# Patient Record
Sex: Male | Born: 1970 | Race: White | Hispanic: No | Marital: Married | Smoking: Current every day smoker
Health system: Southern US, Community
[De-identification: ages and names within clinical notes are randomized; demographics above are authoritative.]

## PROBLEM LIST (undated history)

## (undated) DIAGNOSIS — Z8679 Personal history of other diseases of the circulatory system: Secondary | ICD-10-CM

## (undated) DIAGNOSIS — F1910 Other psychoactive substance abuse, uncomplicated: Secondary | ICD-10-CM

## (undated) DIAGNOSIS — I10 Essential (primary) hypertension: Secondary | ICD-10-CM

## (undated) HISTORY — PX: OTHER SURGICAL HISTORY: SHX169

## (undated) HISTORY — DX: Essential (primary) hypertension: I10

---

## 1997-04-27 HISTORY — PX: EXPLORATORY LAPAROTOMY: SUR591

## 1997-11-21 ENCOUNTER — Emergency Department (HOSPITAL_COMMUNITY): Admission: EM | Admit: 1997-11-21 | Discharge: 1997-11-21 | Payer: Self-pay | Admitting: Emergency Medicine

## 1997-12-01 ENCOUNTER — Inpatient Hospital Stay (HOSPITAL_COMMUNITY): Admission: EM | Admit: 1997-12-01 | Discharge: 1997-12-04 | Payer: Self-pay | Admitting: Emergency Medicine

## 1998-02-06 ENCOUNTER — Emergency Department (HOSPITAL_COMMUNITY): Admission: EM | Admit: 1998-02-06 | Discharge: 1998-02-06 | Payer: Self-pay | Admitting: Emergency Medicine

## 1998-03-08 ENCOUNTER — Encounter: Payer: Self-pay | Admitting: Emergency Medicine

## 1998-03-08 ENCOUNTER — Emergency Department (HOSPITAL_COMMUNITY): Admission: EM | Admit: 1998-03-08 | Discharge: 1998-03-08 | Payer: Self-pay | Admitting: Emergency Medicine

## 1998-12-19 ENCOUNTER — Inpatient Hospital Stay (HOSPITAL_COMMUNITY): Admission: EM | Admit: 1998-12-19 | Discharge: 1998-12-25 | Payer: Self-pay

## 1998-12-23 ENCOUNTER — Encounter: Payer: Self-pay | Admitting: Psychology

## 1999-01-03 ENCOUNTER — Ambulatory Visit (HOSPITAL_COMMUNITY): Admission: RE | Admit: 1999-01-03 | Discharge: 1999-01-03 | Payer: Self-pay | Admitting: General Surgery

## 1999-01-03 ENCOUNTER — Encounter: Payer: Self-pay | Admitting: General Surgery

## 1999-06-04 ENCOUNTER — Emergency Department (HOSPITAL_COMMUNITY): Admission: EM | Admit: 1999-06-04 | Discharge: 1999-06-04 | Payer: Self-pay | Admitting: Emergency Medicine

## 1999-06-05 ENCOUNTER — Encounter: Payer: Self-pay | Admitting: Emergency Medicine

## 1999-06-08 ENCOUNTER — Emergency Department (HOSPITAL_COMMUNITY): Admission: EM | Admit: 1999-06-08 | Discharge: 1999-06-08 | Payer: Self-pay | Admitting: Emergency Medicine

## 1999-06-20 ENCOUNTER — Emergency Department (HOSPITAL_COMMUNITY): Admission: EM | Admit: 1999-06-20 | Discharge: 1999-06-20 | Payer: Self-pay | Admitting: *Deleted

## 1999-06-21 ENCOUNTER — Encounter: Payer: Self-pay | Admitting: *Deleted

## 1999-06-27 ENCOUNTER — Encounter: Payer: Self-pay | Admitting: Urology

## 1999-06-27 ENCOUNTER — Encounter: Admission: RE | Admit: 1999-06-27 | Discharge: 1999-06-27 | Payer: Self-pay | Admitting: Urology

## 1999-07-15 ENCOUNTER — Encounter: Payer: Self-pay | Admitting: Urology

## 1999-07-15 ENCOUNTER — Encounter: Admission: RE | Admit: 1999-07-15 | Discharge: 1999-07-15 | Payer: Self-pay | Admitting: Urology

## 1999-08-19 ENCOUNTER — Encounter: Admission: RE | Admit: 1999-08-19 | Discharge: 1999-08-19 | Payer: Self-pay | Admitting: *Deleted

## 2005-08-26 ENCOUNTER — Encounter: Payer: Self-pay | Admitting: Emergency Medicine

## 2006-06-14 ENCOUNTER — Emergency Department (HOSPITAL_COMMUNITY): Admission: EM | Admit: 2006-06-14 | Discharge: 2006-06-14 | Payer: Self-pay | Admitting: Emergency Medicine

## 2006-06-14 IMAGING — CT CT ABDOMEN W/O CM
2 of 4 series · 17 of 46 positions shown, 19 images · IV contrast (agent unspecified)
Comparison: none

CLINICAL DATA: Right flank pain for several days.  
 ABDOMEN CT WITHOUT CONTRAST:
TECHNIQUE: Multidetector CT imaging of the abdomen was performed following the standard protocol without IV contrast.
TECHNIQUE: Multidetector CT imaging of the pelvis was performed following the standard protocol without IV contrast.

[Series 3: recon 2: renal stone · axial · 0.70mm/px · z∈[-494,-131]mm · 14 of 629 slices shown, 16 images]
[im 24/629  soft-tissue]
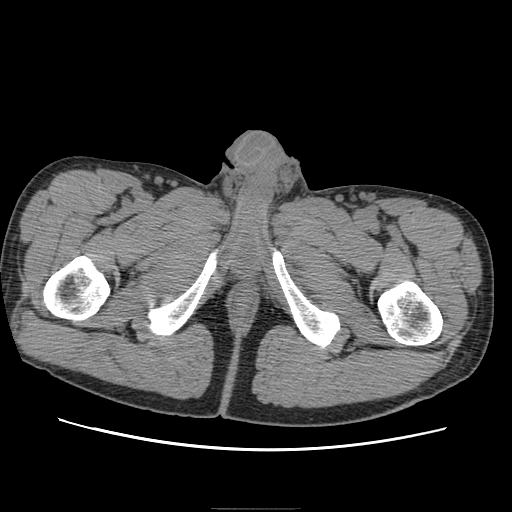
[im 24/629  bone]
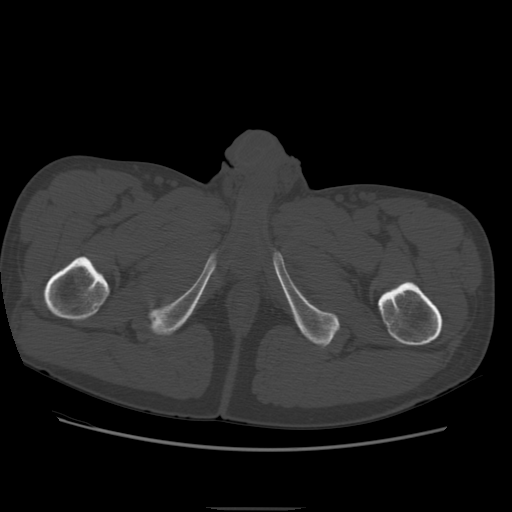
[im 70/629  soft-tissue]
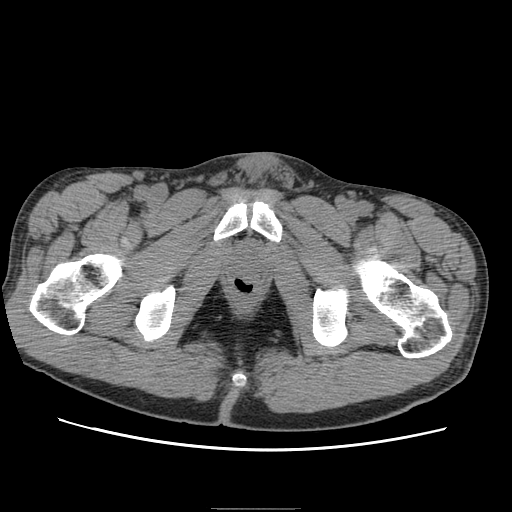
[im 117/629  soft-tissue]
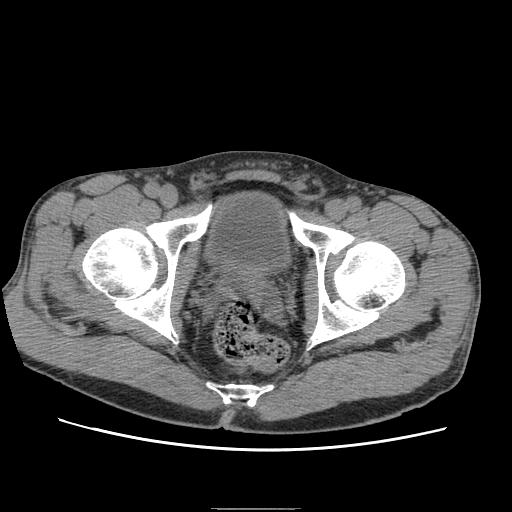
[im 163/629  soft-tissue]
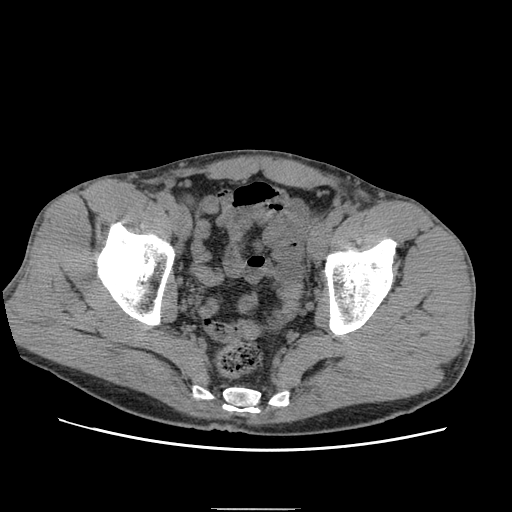
[im 210/629  soft-tissue]
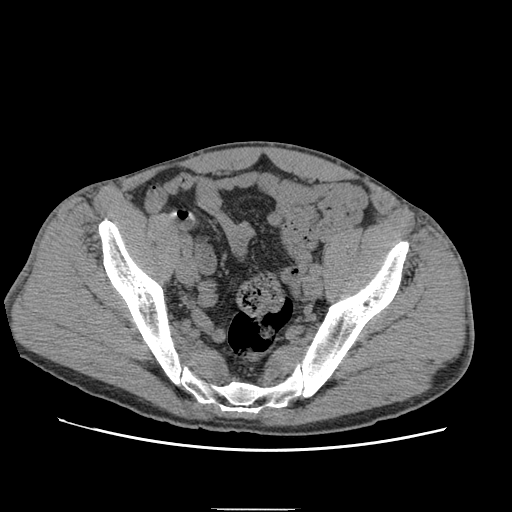
[im 256/629  soft-tissue]
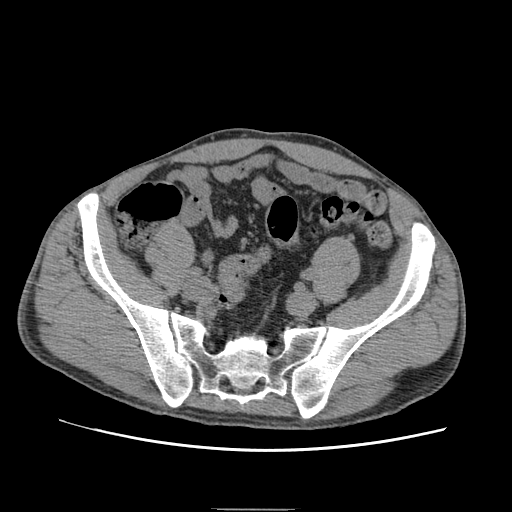
[im 303/629  soft-tissue]
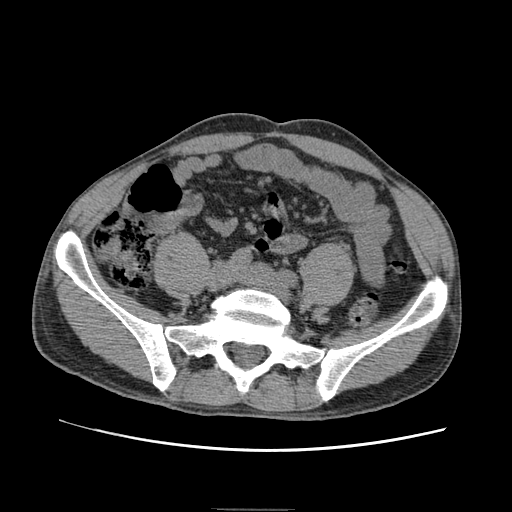
[im 326/629  soft-tissue]
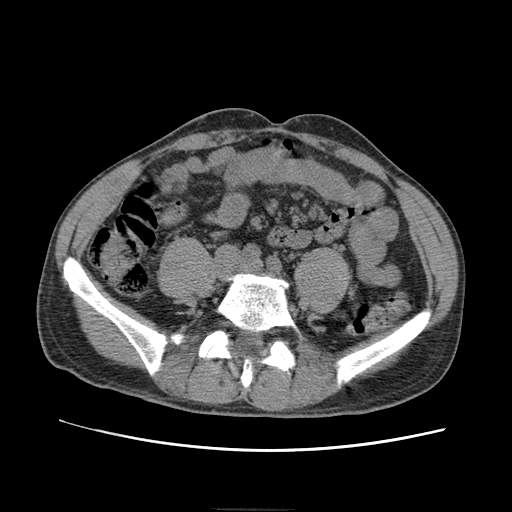
[im 373/629  soft-tissue]
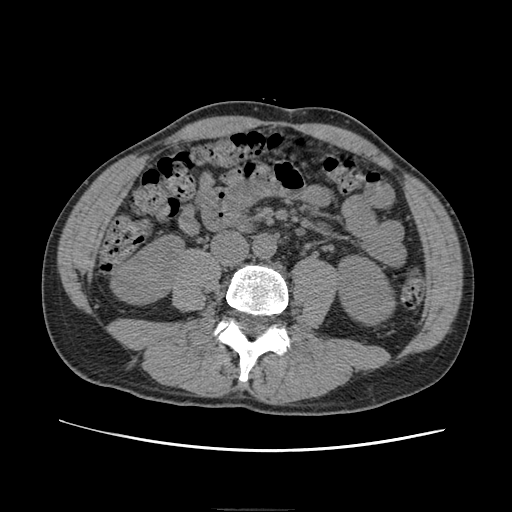
[im 373/629  bone]
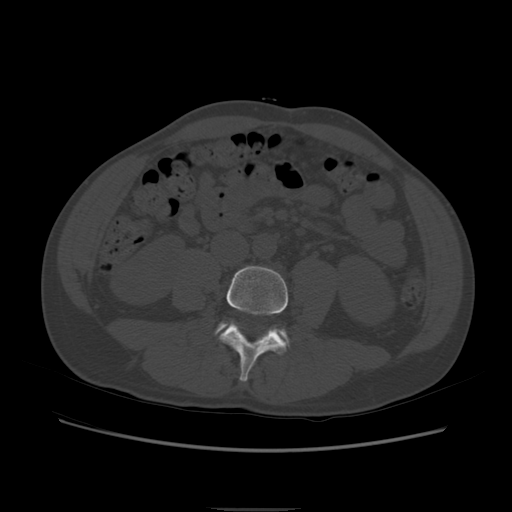
[im 419/629  soft-tissue]
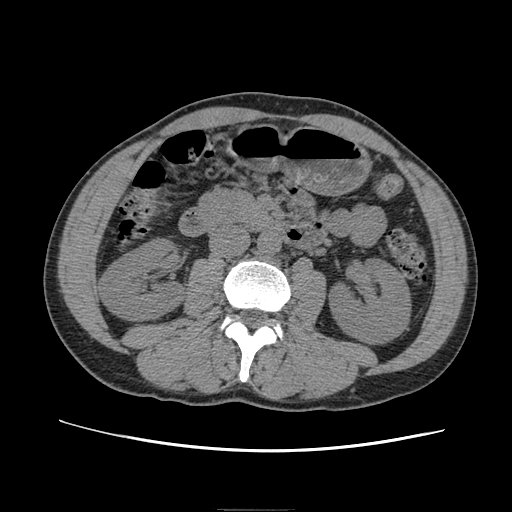
[im 466/629  soft-tissue]
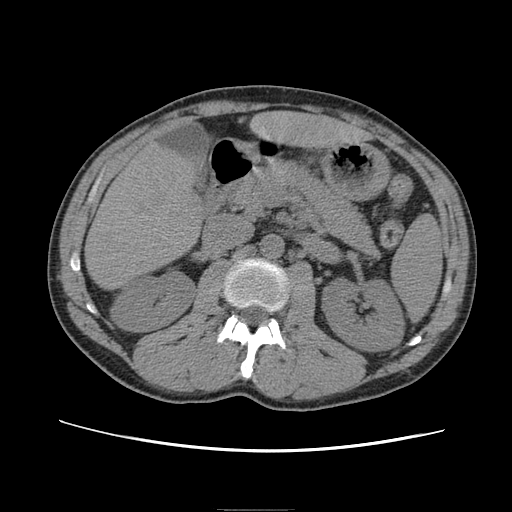
[im 512/629  soft-tissue]
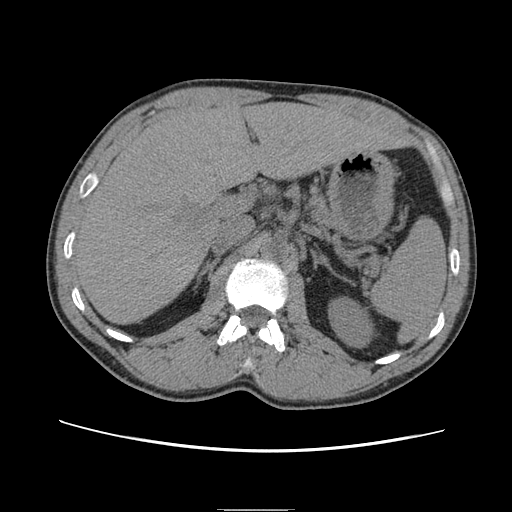
[im 559/629  soft-tissue]
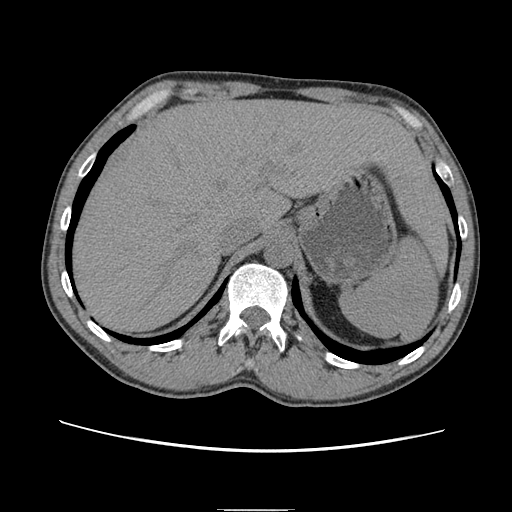
[im 605/629  soft-tissue]
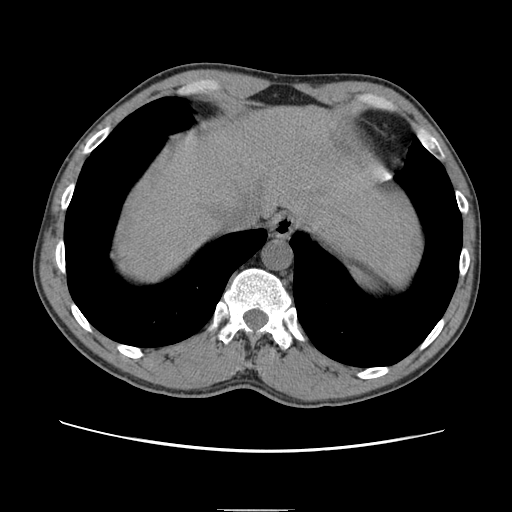

[Series 301: reformatted · coronal · 0.83mm/px · 3 of 116 slices shown]
[im 39/116  soft-tissue]
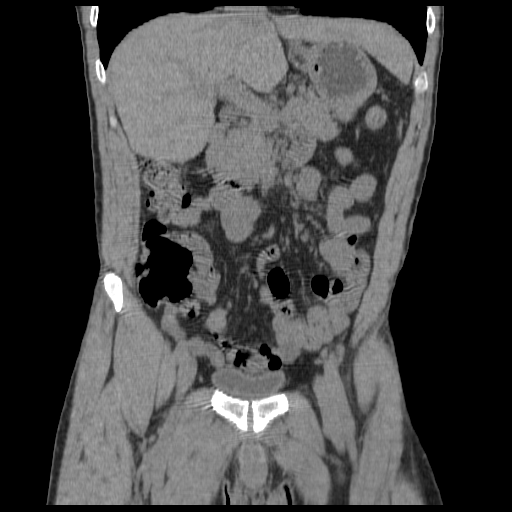
[im 52/116  soft-tissue]
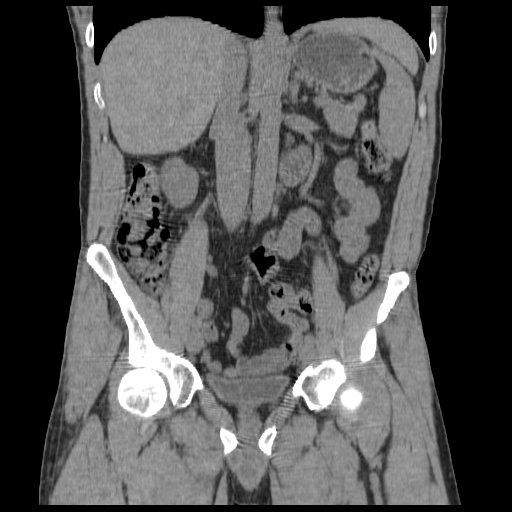
[im 64/116  soft-tissue]
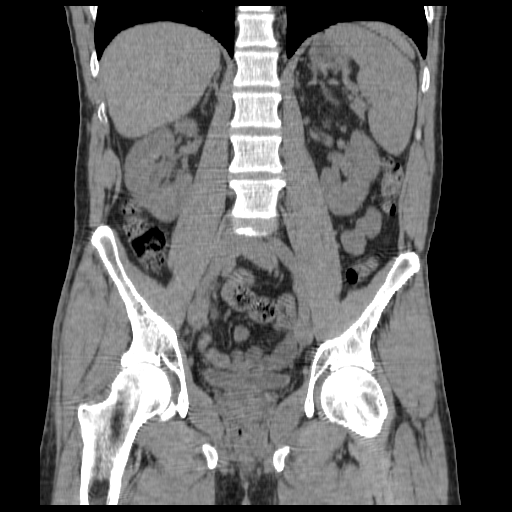

[17 of 46 positions shown; findings below may reference images not displayed]

FINDINGS: On the initial scan through the lung bases, there is a small nodule in the anterior right lower lobe of questionable significance.  No effusion is seen.  No renal calculi are seen and there is no evidence of hydronephrosis.  The proximal ureters are normal in caliber.  The remainder of the study shows the liver to appear normal in the unenhanced state.  No calcified gallstones are seen.  The pancreas, adrenal glands, and spleen appear normal.  The abdominal aorta is normal in caliber.
IMPRESSION: 1.  No renal calculi.  No hydronephrosis.  
 2.  Tiny nodule in right lower lobe anteriorly on initial image of questionable significance.  
 PELVIS CT WITHOUT CONTRAST:
FINDINGS: Despite the right ureter not being dilated, there does appear to be a nonobstructing 4.5 mm distal right ureteral calculus very near the expected right UV junction.  The left ureter is normal in caliber.  The urinary bladder is unremarkable.  The prostate is normal in size.
IMPRESSION: Nonobstructing 4.5 mm distal right ureteral calculus very near the expected right UV junction.

## 2006-10-24 ENCOUNTER — Emergency Department (HOSPITAL_COMMUNITY): Admission: EM | Admit: 2006-10-24 | Discharge: 2006-10-25 | Payer: Self-pay | Admitting: Emergency Medicine

## 2008-02-20 ENCOUNTER — Emergency Department (HOSPITAL_COMMUNITY): Admission: EM | Admit: 2008-02-20 | Discharge: 2008-02-20 | Payer: Self-pay | Admitting: Emergency Medicine

## 2008-11-01 ENCOUNTER — Emergency Department: Payer: Self-pay | Admitting: Emergency Medicine

## 2008-11-01 IMAGING — CR DG CHEST 1V
1 series · 1 of 1 positions shown · non-contrast
Comparison: none

REASON FOR EXAM: pain/ injury   Flex
COMMENTS:   LMP: (Male)

PROCEDURE:     DXR - DXR CHEST 1 VIEWAP OR PA  - [DATE]  [DATE]
RESULT:     PA view of the chest shows the lung fields to be clear. No
pneumonia, pneumothorax or pleural effusion is seen. Heart size is normal.
The chest is hyperexpanded, suspicious for a history of reactive airway
disease.

[view not recorded]
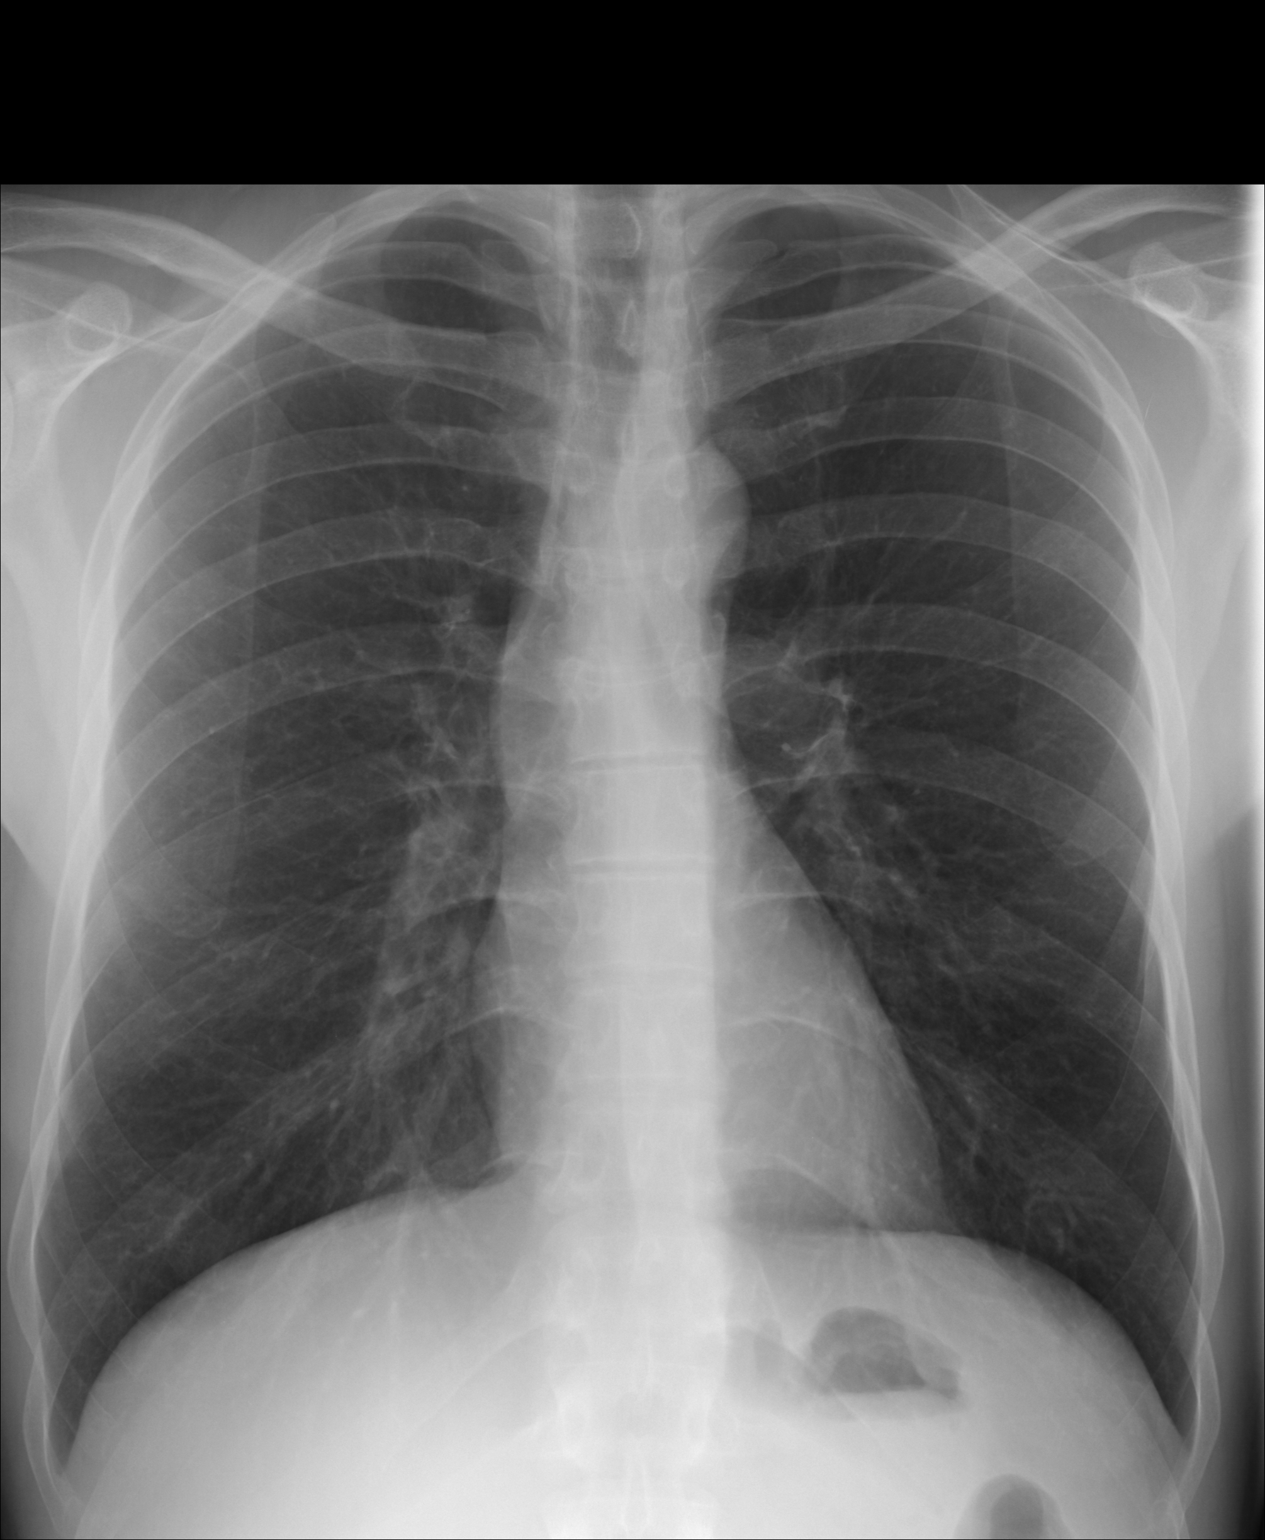

[1 of 1 positions shown; findings below may reference images not displayed]

IMPRESSION: 1. The lung fields are clear.
2. Heart size is normal.
3. No acute bony abnormalities are seen.

## 2008-11-01 IMAGING — CR DG RIBS 2V*L*
1 series · 4 of 4 positions shown · non-contrast
Comparison: none

REASON FOR EXAM: pain / injury
COMMENTS:   LMP: (Male)

PROCEDURE:     DXR - DXR RIBS LEFT UNILATERAL  - [DATE]  [DATE]
RESULT:     No fracture or other acute bony abnormality is identified. No
pneumothorax is seen.

[Series 1: view not recorded · 0.17mm/px · 4 of 4 slices shown]
[im 1/4]
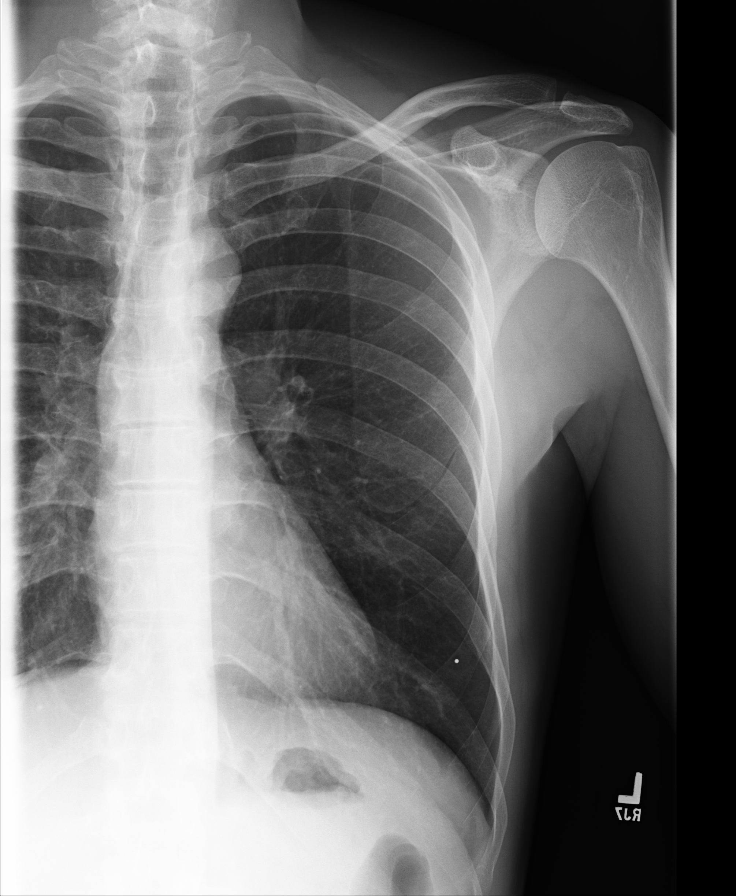
[im 2/4]
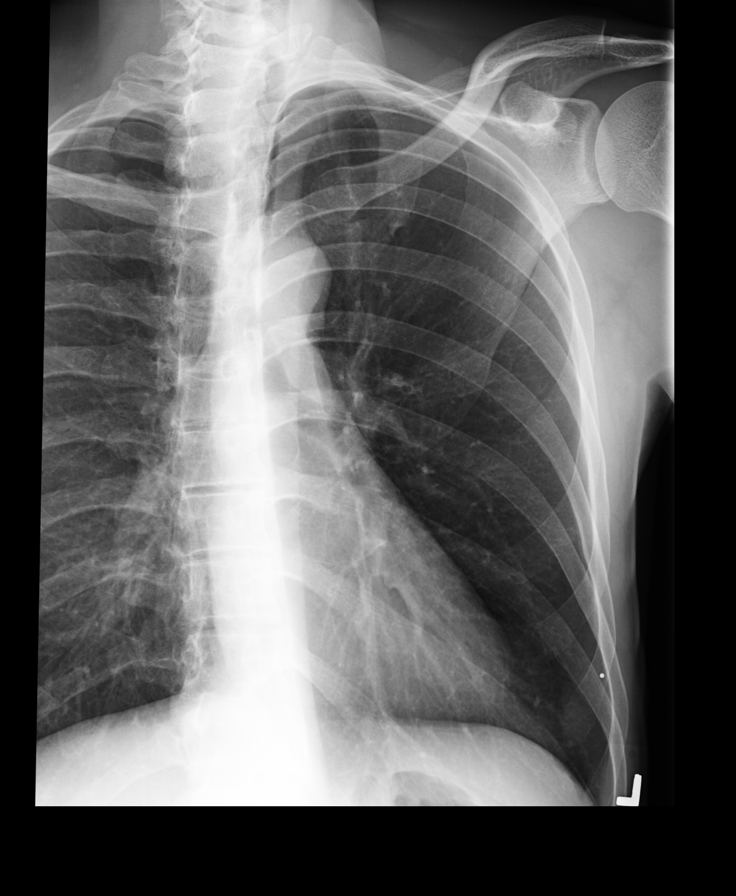
[im 3/4]
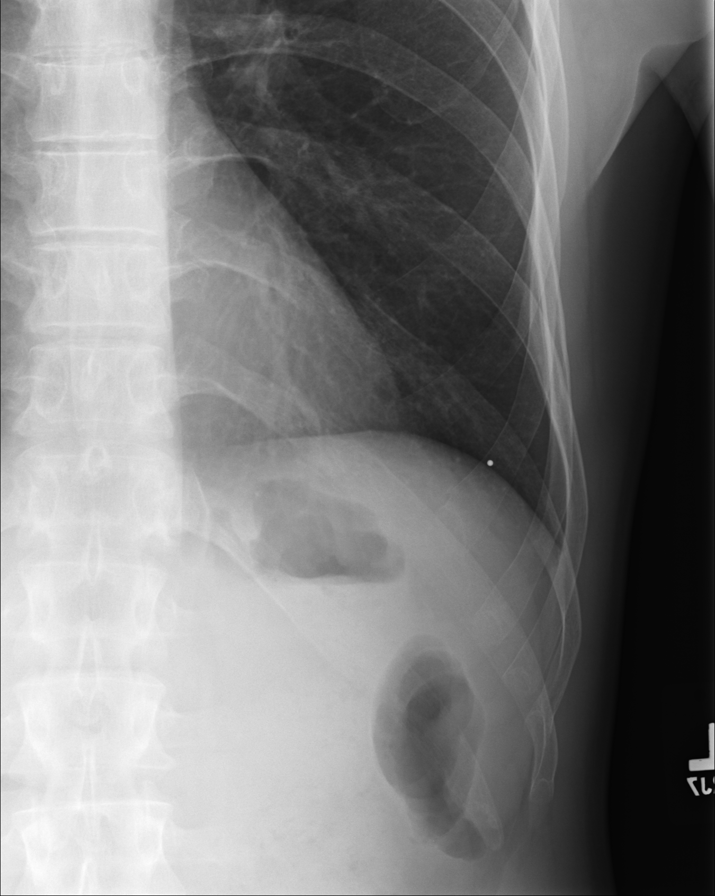
[im 4/4]
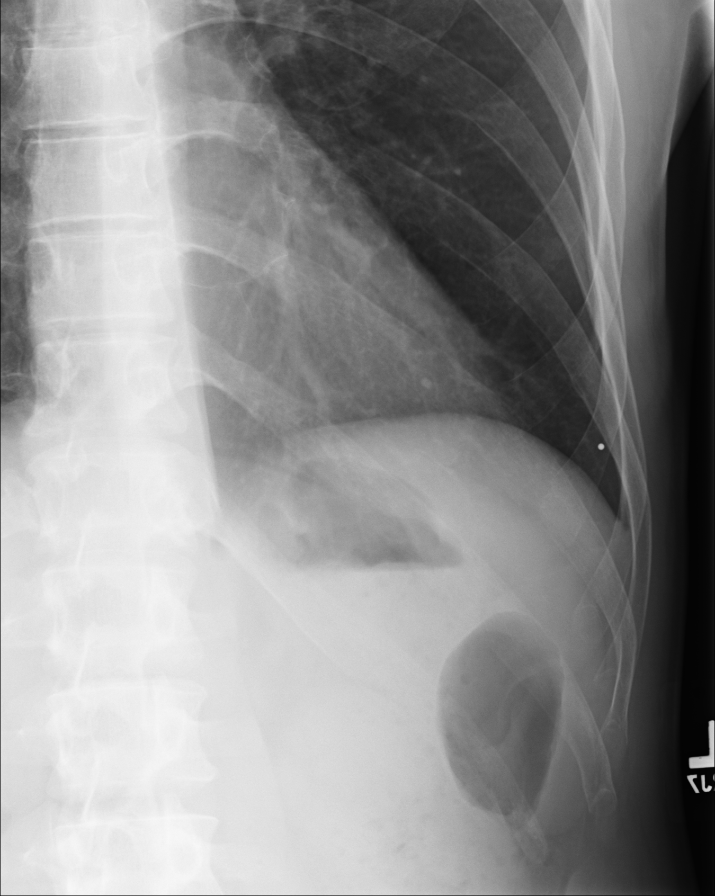

[4 of 4 positions shown; findings below may reference images not displayed]

IMPRESSION: 1.     No acute changes are identified.

## 2010-04-27 DIAGNOSIS — I10 Essential (primary) hypertension: Secondary | ICD-10-CM | POA: Insufficient documentation

## 2010-04-27 HISTORY — DX: Essential (primary) hypertension: I10

## 2015-10-01 ENCOUNTER — Emergency Department (HOSPITAL_COMMUNITY): Payer: Self-pay

## 2015-10-01 ENCOUNTER — Observation Stay (HOSPITAL_COMMUNITY)
Admission: EM | Admit: 2015-10-01 | Discharge: 2015-10-02 | Disposition: A | Discharge status: Home / Self Care | Payer: Self-pay | Attending: Internal Medicine | Admitting: Internal Medicine

## 2015-10-01 ENCOUNTER — Encounter (HOSPITAL_COMMUNITY): Payer: Self-pay | Admitting: *Deleted

## 2015-10-01 DIAGNOSIS — Z720 Tobacco use: Secondary | ICD-10-CM

## 2015-10-01 DIAGNOSIS — R0902 Hypoxemia: Secondary | ICD-10-CM | POA: Insufficient documentation

## 2015-10-01 DIAGNOSIS — Z880 Allergy status to penicillin: Secondary | ICD-10-CM | POA: Insufficient documentation

## 2015-10-01 DIAGNOSIS — R05 Cough: Secondary | ICD-10-CM | POA: Insufficient documentation

## 2015-10-01 DIAGNOSIS — R0602 Shortness of breath: Principal | ICD-10-CM | POA: Insufficient documentation

## 2015-10-01 DIAGNOSIS — Z79899 Other long term (current) drug therapy: Secondary | ICD-10-CM | POA: Insufficient documentation

## 2015-10-01 DIAGNOSIS — R0600 Dyspnea, unspecified: Secondary | ICD-10-CM | POA: Insufficient documentation

## 2015-10-01 DIAGNOSIS — J45901 Unspecified asthma with (acute) exacerbation: Secondary | ICD-10-CM

## 2015-10-01 DIAGNOSIS — F1110 Opioid abuse, uncomplicated: Secondary | ICD-10-CM

## 2015-10-01 DIAGNOSIS — F17210 Nicotine dependence, cigarettes, uncomplicated: Secondary | ICD-10-CM

## 2015-10-01 DIAGNOSIS — F1120 Opioid dependence, uncomplicated: Secondary | ICD-10-CM

## 2015-10-01 DIAGNOSIS — Z8679 Personal history of other diseases of the circulatory system: Secondary | ICD-10-CM | POA: Insufficient documentation

## 2015-10-01 DIAGNOSIS — I10 Essential (primary) hypertension: Secondary | ICD-10-CM

## 2015-10-01 DIAGNOSIS — J069 Acute upper respiratory infection, unspecified: Secondary | ICD-10-CM | POA: Insufficient documentation

## 2015-10-01 DIAGNOSIS — F1190 Opioid use, unspecified, uncomplicated: Secondary | ICD-10-CM

## 2015-10-01 DIAGNOSIS — R0781 Pleurodynia: Secondary | ICD-10-CM | POA: Insufficient documentation

## 2015-10-01 DIAGNOSIS — F17200 Nicotine dependence, unspecified, uncomplicated: Secondary | ICD-10-CM | POA: Insufficient documentation

## 2015-10-01 HISTORY — DX: Personal history of other diseases of the circulatory system: Z86.79

## 2015-10-01 LAB — BASIC METABOLIC PANEL
Anion gap: 10 (ref 5–15)
BUN: 13 mg/dL (ref 6–20)
CHLORIDE: 99 mmol/L — AB (ref 101–111)
CO2: 28 mmol/L (ref 22–32)
CREATININE: 0.77 mg/dL (ref 0.61–1.24)
Calcium: 9.4 mg/dL (ref 8.9–10.3)
GFR calc Af Amer: >60 mL/min (ref >60)
GFR calc non Af Amer: >60 mL/min (ref >60)
GLUCOSE: 126 mg/dL — AB (ref 65–99)
Potassium: 4.4 mmol/L (ref 3.5–5.1)
SODIUM: 137 mmol/L (ref 135–145)

## 2015-10-01 LAB — CBC
HCT: 45.2 % (ref 39.0–52.0)
Hemoglobin: 14.9 g/dL (ref 13.0–17.0)
MCH: 29.8 pg (ref 26.0–34.0)
MCHC: 33 g/dL (ref 30.0–36.0)
MCV: 90.4 fL (ref 78.0–100.0)
PLATELETS: 256 10*3/uL (ref 150–400)
RBC: 5 MIL/uL (ref 4.22–5.81)
RDW: 13.1 % (ref 11.5–15.5)
WBC: 8.9 10*3/uL (ref 4.0–10.5)

## 2015-10-01 LAB — RAPID URINE DRUG SCREEN, HOSP PERFORMED
Amphetamines: NOT DETECTED
Barbiturates: NOT DETECTED
Benzodiazepines: NOT DETECTED
Cocaine: NOT DETECTED
Opiates: POSITIVE — AB
Tetrahydrocannabinol: NOT DETECTED

## 2015-10-01 LAB — I-STAT TROPONIN, ED: Troponin i, poc: 0 ng/mL (ref 0.00–0.08)

## 2015-10-01 IMAGING — DX DG CHEST 1V PORT
1 series · 1 of 1 positions shown · non-contrast
Comparison: [DATE]

CLINICAL DATA: Shortness of breath and cough for 2 nights,
tachypnea, wheezing, history smoking, heroin use

EXAM:
PORTABLE CHEST 1 VIEW

[chest ap]
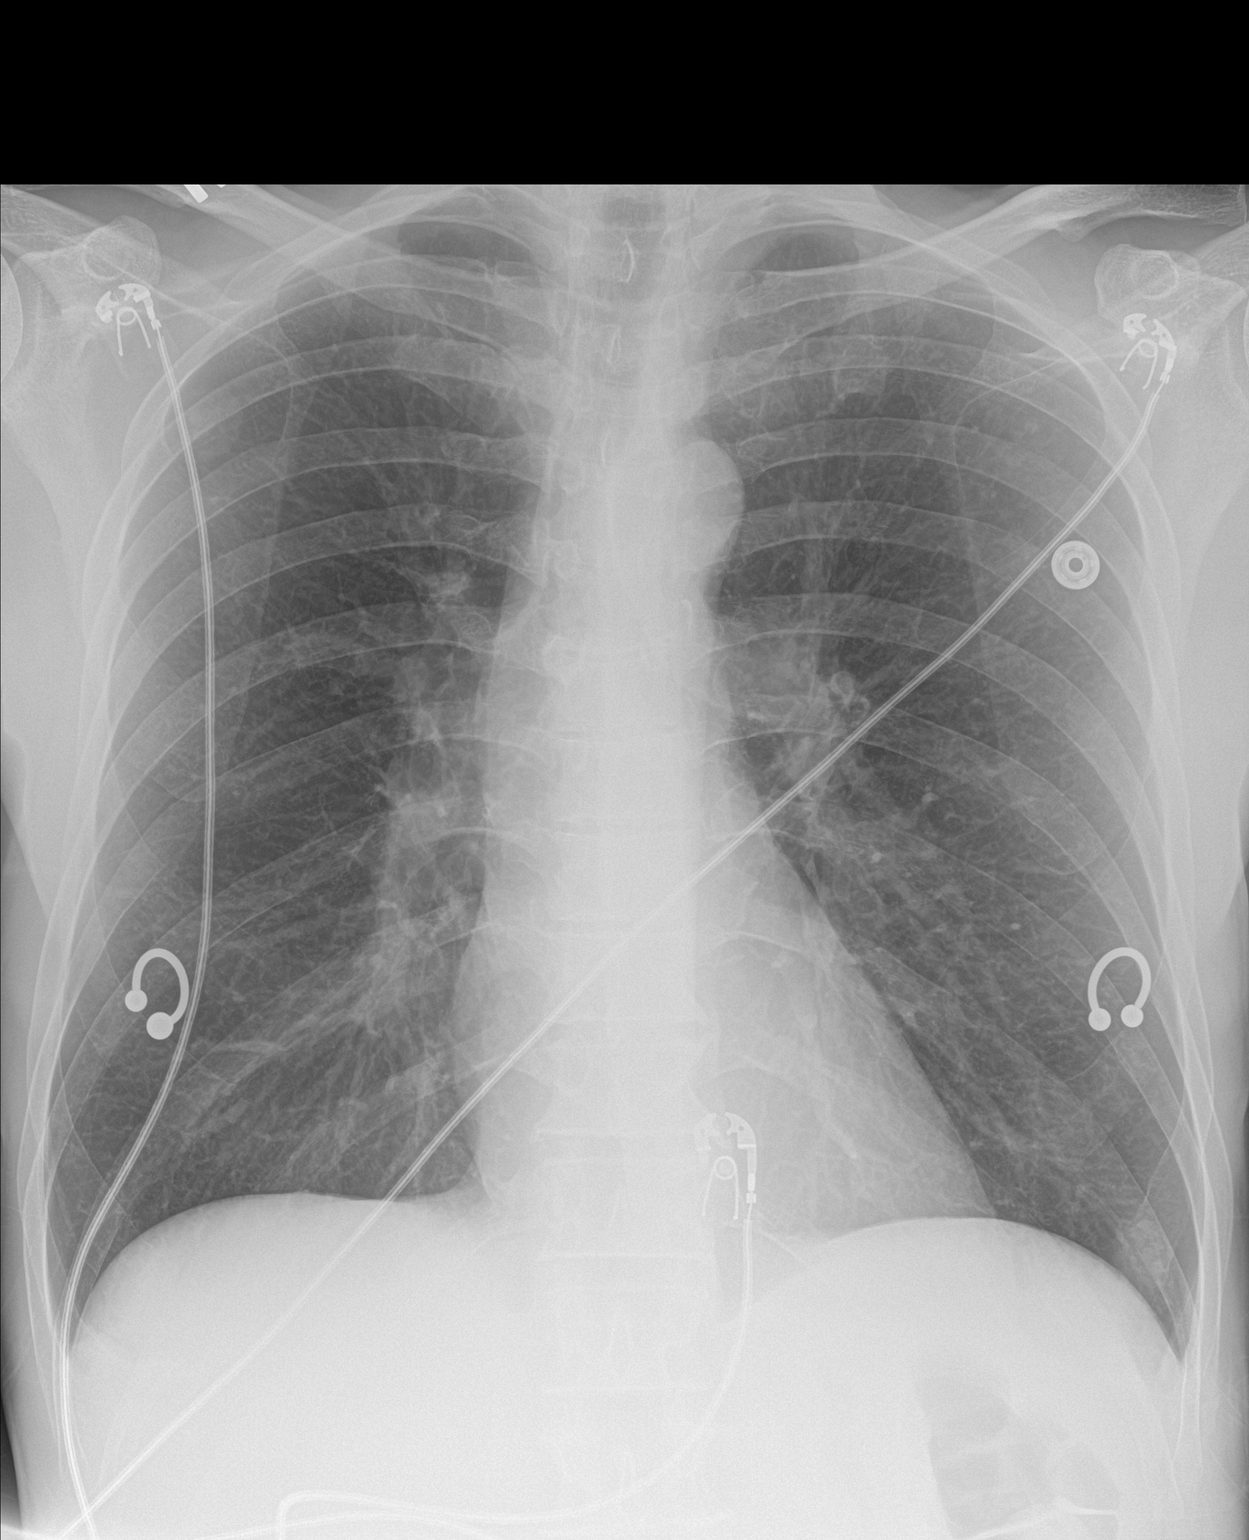

[1 of 1 positions shown; findings below may reference images not displayed]

FINDINGS: Normal heart size, mediastinal contours, and pulmonary vascularity.

Lungs hyperinflated but clear.

No pulmonary infiltrate, pleural effusion or pneumothorax.

BILATERAL nipple rings.

EKG leads project over chest.

Osseous structures unremarkable.
IMPRESSION: Hyperinflated lungs without acute infiltrate.

## 2015-10-01 MED ORDER — ALBUTEROL SULFATE (2.5 MG/3ML) 0.083% IN NEBU
5.0000 mg | INHALATION_SOLUTION | Freq: Once | RESPIRATORY_TRACT | Status: AC
  Administered 2015-10-01: 5 mg via RESPIRATORY_TRACT
  Filled 2015-10-01: qty 6

## 2015-10-01 MED ORDER — IPRATROPIUM BROMIDE 0.02 % IN SOLN
1.0000 mg | Freq: Once | RESPIRATORY_TRACT | Status: AC
  Administered 2015-10-01: 1 mg via RESPIRATORY_TRACT
  Filled 2015-10-01: qty 5

## 2015-10-01 MED ORDER — ACETAMINOPHEN 650 MG RE SUPP: 650.0000 mg | Freq: Four times a day (QID) | RECTAL | Status: DC | PRN

## 2015-10-01 MED ORDER — CYCLOBENZAPRINE HCL 5 MG PO TABS
7.5000 mg | ORAL_TABLET | Freq: Three times a day (TID) | ORAL | Status: DC | PRN
Start: 2015-10-01 — End: 2015-10-02
  Administered 2015-10-01 – 2015-10-02 (×2): 7.5 mg via ORAL
  Filled 2015-10-01 (×2): qty 2

## 2015-10-01 MED ORDER — NICOTINE 21 MG/24HR TD PT24
21.0000 mg | MEDICATED_PATCH | Freq: Every day | TRANSDERMAL | Status: DC
Start: 2015-10-01 — End: 2015-10-02
  Administered 2015-10-01 – 2015-10-02 (×2): 21 mg via TRANSDERMAL
  Filled 2015-10-01 (×2): qty 1

## 2015-10-01 MED ORDER — ACETAMINOPHEN 325 MG PO TABS
650.0000 mg | ORAL_TABLET | Freq: Four times a day (QID) | ORAL | Status: DC | PRN
  Administered 2015-10-01 – 2015-10-02 (×2): 650 mg via ORAL
  Filled 2015-10-01 (×2): qty 2

## 2015-10-01 MED ORDER — DIPHENHYDRAMINE HCL 25 MG PO CAPS
25.0000 mg | ORAL_CAPSULE | Freq: Once | ORAL | Status: AC
  Administered 2015-10-01: 25 mg via ORAL
  Filled 2015-10-01: qty 1

## 2015-10-01 MED ORDER — KETOROLAC TROMETHAMINE 15 MG/ML IJ SOLN
15.0000 mg | Freq: Once | INTRAMUSCULAR | Status: AC
  Administered 2015-10-01: 15 mg via INTRAVENOUS
  Filled 2015-10-01: qty 1

## 2015-10-01 MED ORDER — IPRATROPIUM-ALBUTEROL 0.5-2.5 (3) MG/3ML IN SOLN
3.0000 mL | RESPIRATORY_TRACT | Status: DC
  Administered 2015-10-02 (×2): 3 mL via RESPIRATORY_TRACT
  Filled 2015-10-01 (×4): qty 3

## 2015-10-01 MED ORDER — ENOXAPARIN SODIUM 40 MG/0.4ML ~~LOC~~ SOLN
40.0000 mg | SUBCUTANEOUS | Status: DC
  Administered 2015-10-01: 40 mg via SUBCUTANEOUS
  Filled 2015-10-01: qty 0.4

## 2015-10-01 MED ORDER — BENZONATATE 100 MG PO CAPS
100.0000 mg | ORAL_CAPSULE | Freq: Two times a day (BID) | ORAL | Status: DC
  Administered 2015-10-01 – 2015-10-02 (×3): 100 mg via ORAL
  Filled 2015-10-01 (×3): qty 1

## 2015-10-01 MED ORDER — ALBUTEROL SULFATE HFA 108 (90 BASE) MCG/ACT IN AERS: 1.0000 | INHALATION_SPRAY | RESPIRATORY_TRACT | Status: DC | PRN

## 2015-10-01 MED ORDER — ALBUTEROL SULFATE (2.5 MG/3ML) 0.083% IN NEBU: 2.5000 mg | INHALATION_SOLUTION | RESPIRATORY_TRACT | Status: DC | PRN

## 2015-10-01 MED ORDER — ASPIRIN 81 MG PO CHEW
324.0000 mg | CHEWABLE_TABLET | Freq: Once | ORAL | Status: AC
  Administered 2015-10-01: 324 mg via ORAL
  Filled 2015-10-01: qty 4

## 2015-10-01 MED ORDER — ALBUTEROL SULFATE (2.5 MG/3ML) 0.083% IN NEBU
INHALATION_SOLUTION | RESPIRATORY_TRACT | Status: AC
  Filled 2015-10-01: qty 6

## 2015-10-01 MED ORDER — PREDNISONE 20 MG PO TABS
60.0000 mg | ORAL_TABLET | Freq: Once | ORAL | Status: AC
  Administered 2015-10-01: 60 mg via ORAL
  Filled 2015-10-01: qty 3

## 2015-10-01 MED ORDER — PREDNISONE 20 MG PO TABS
60.0000 mg | ORAL_TABLET | Freq: Every day | ORAL | Status: DC
  Administered 2015-10-02: 60 mg via ORAL
  Filled 2015-10-01: qty 3

## 2015-10-01 MED ORDER — ALBUTEROL SULFATE (2.5 MG/3ML) 0.083% IN NEBU
5.0000 mg | INHALATION_SOLUTION | Freq: Once | RESPIRATORY_TRACT | Status: AC
  Administered 2015-10-01: 5 mg via RESPIRATORY_TRACT

## 2015-10-01 NOTE — ED Notes (Signed)
Breathing tx initiated, will monitor after O2 saturation after completion and place on O2 via [place] as needed

## 2015-10-01 NOTE — H&P (Signed)
Date: 10/01/2015               Patient Name:  Franklin Anderson MRN: [id number]  DOB: 02-Nov-1970 Age / Sex: 45 y.o., male   PCP: No primary care provider on file.              Medical Service: Internal Medicine Teaching Service              Attending Physician: Dr. Inez Catalina, MD    First Contact: Danelle Earthly, MS 4 Pager: [phone]  Second Contact: Dr.Truong Pager: [phone]       After Hours (After 5p/  First Contact Pager: [phone]  weekends / holidays): Second Contact Pager: [phone]   Chief Complaint: shortness of breath  History of Present Illness: Franklin Anderson is a 45 year old current smoker with a history of IV Heroin use and PMH of rheumatic heart disease (45 years old) who presents with shortness of breath.  Two nights ago, he was awakened out of his sleep with shortness of breath.  Unable to walk 10-15 feet without symptoms.  Improved with mother's home oxygen.  Never had previous symptoms in the past. Denies sick contacts.  Reports  associated  productive cough (non-bloody, clear sputum), tactile fever, headache, generalized muscle aches, night sweats, and nasal congestion/drainage. Also reports chest pain localized to the mid-sternal chest wall that is exacerbated by coughing.    Current IV Heroine user who goes to a  Methadone clinic ([facility]).  Last used this morning. Takes Methadone 90mg  daily.  Has smoked 1.5 packs of cigarettes for 10-15 feet Past medical history is significant for rheumatic fever at 45 years old that resolved within a year. Never diagnosed with lung disease.   Family history is significat for CAD and COPD  In ED, patient given nebulization treatments with Atrovent and albuterol and a dose of Prednison 60mg  which improved symptoms. CXR showed bilateral hyperinflation of lungs without acute infiltrate    Meds: Current Facility-Administered Medications  Medication Dose Route Frequency Provider Last Rate Last Dose  . acetaminophen (TYLENOL) tablet  650 mg  650 mg Oral Q6H PRN Denton Brick, MD       Or  . acetaminophen (TYLENOL) suppository 650 mg  650 mg Rectal Q6H PRN Denton Brick, MD      . benzonatate (TESSALON) capsule 100 mg  100 mg Oral BID Denton Brick, MD      . cyclobenzaprine (FLEXERIL) tablet 7.5 mg  7.5 mg Oral TID PRN Denton Brick, MD      . enoxaparin (LOVENOX) injection 40 mg  40 mg Subcutaneous Q24H Denton Brick, MD       Current Outpatient Prescriptions  Medication Sig Dispense Refill  . acetaminophen (TYLENOL) 325 MG tablet Take 650 mg by mouth every 6 (six) hours as needed for mild pain.    . Aspirin-Salicylamide-Caffeine (BC HEADACHE POWDER PO) Take 1 packet by mouth every 6 (six) hours as needed (pain).    Marland Kitchen ibuprofen (ADVIL,MOTRIN) 200 MG tablet Take 200 mg by mouth every 6 (six) hours as needed for moderate pain.      Allergies: Allergies as of 10/01/2015 - Review Complete 10/01/2015  Allergen Reaction Noted  . Penicillins Other (See Comments) 10/01/2015   History reviewed. No pertinent past medical history. Past Surgical History  Procedure Laterality Date  . Collapsed lung     No family history on file. Social History   Social History  . Marital Status: Divorced  Spouse Name: N/A  . Number of Children: N/A  . Years of Education: N/A   Occupational History  . Not on file.   Social History Main Topics  . Smoking status: Current Every Day Smoker  . Smokeless tobacco: Not on file  . Alcohol Use: No  . Drug Use: Yes    Special: IV     Comment: Heroin  . Sexual Activity: Not on file   Other Topics Concern  . Not on file   Social History Narrative  . No narrative on file    Review of Systems: Constitutional: positive for fatigue, fevers, night sweats and weight loss Eyes: negative for visual disturbance Ears, nose, mouth, throat, and face: positive for nasal congestion and drainage Respiratory: positive for productive cough, dyspnea on exertion, and sputum Cardiovascular:  positive for chest pressure/discomfort and tachycardia Gastrointestinal: negative for diarrhea, constipation, and abdominal pain Skin: negative for rashes MSK: back and neck pain Thorough ROS complete and otherwise negative  Physical Exam: Blood pressure 151/93, pulse 104, temperature 98.4 F (36.9 C), temperature source Oral, resp. rate 21, SpO2 90 %. BP 151/93 mmHg  Pulse 104  Temp(Src) 98.4 F (36.9 C) (Oral)  Resp 21  SpO2 90%  General Appearance:    Alert, cooperative, no acute distress, coughing throughout evaluation  Head:    Normocephalic, without obvious abnormality, atraumatic  Eyes:    PERRL, EOM's intact    Nose:   Drainage and sinus tenderness  Throat:   Pharyngeal erythema  Neck:   Supple, symmetrical  Lungs:     Diffuse harsh breath sounds bilaterally  Heart:    Regular rate and rhythm, S1 and S2 normal, no murmur, rub   or gallop  Abdomen:     Soft, non-tender, bowel sounds active all four quadrants  Extremities:   Extremities normal, atraumatic, no cyanosis or edema  Pulses:   2+ and symmetric all extremities  Skin:   Skin color, turgor normal, no rashes or lesions  Neurologic:   CNII-XII grossly intact    Lab results: Basic Metabolic Panel:  Recent Labs  16/10/96 1334  NA 137  K 4.4  CL 99*  CO2 28  GLUCOSE 126*  BUN 13  CREATININE 0.77  CALCIUM 9.4   CBC:  Recent Labs  10/01/15 1334  WBC 8.9  HGB 14.9  HCT 45.2  MCV 90.4  PLT 256   Cardiac Enzymes: Troponin I POC: 0.00  Imaging results:  Dg Chest Portable 1 View  10/01/2015  CLINICAL DATA:  Shortness of breath and cough for 2 nights, tachypnea, wheezing, history smoking, heroin use EXAM: PORTABLE CHEST 1 VIEW COMPARISON:  11/01/2008 FINDINGS: Normal heart size, mediastinal contours, and pulmonary vascularity. Lungs hyperinflated but clear. No pulmonary infiltrate, pleural effusion or pneumothorax. BILATERAL nipple rings. EKG leads project over chest. Osseous structures unremarkable.  IMPRESSION: Hyperinflated lungs without acute infiltrate. Electronically Signed   By: Ulyses Southward M.D.   On: 10/01/2015 15:01    Other results: EKG: NSR, biatrial enlargement, LVH, peaked T waves. <ECG>   Assessment & Plan by Problem: Principal Problem:   Shortness of breath Active Problems:   HTN (hypertension)   Methadone use (HCC)   Heroin use   Tobacco abuse  1. Shortness of breath/cough: Mostly likely due to exacerbation of undiagnosed chronic lung disease s/p viral upper respiratory infection. Hyperinflated lungs on CXR  in the setting of chronic cigarette use which suggests chronic obstructive pattern.  Patient also has a history of rheumatic fever and  IV drug use which places patient at high risk for valvular heart disease and endocarditis.  Biventricular enlargement and LVH seen on EKG does suggests underlying heart dysfunction.  Less concerning as no murmur appreciated on exam  -- Tessalon pearles 100mg  BID -- Begin DuoNeb 3mL q4h -- Begin Prednisone 60mg  daily in AM -- continue to monitor O2 saturation  2. Hypertension: Hypertensive s/p SABA nebulizer treatment.     - Consider low dose Ace inhibitor  3. Heroin Abuse: current user. Established with methadone clinic and  takes methadone daily  -- contact Methadone clinic in the morning to verify dose and initiate prophylactic administration  4. Tobacco Abuse: current 1.5 pack/day smoker -- begin nicotine patch  5.  Back/neck pain -- PRN Flexeril 7.5mg   PPX:   -- DVT: Lovenox 40mg   Dispo: pending clinical improvement.    This is a Psychologist, occupationalMedical Student Note.  The care of the patient was discussed with Dr. Danella Pentonruong and the assessment and plan was formulated with their assistance.  Please see their note for official documentation of the patient encounter.   Signed: Danelle Earthlyaryl Dama Hedgepeth, Med Student 10/01/2015, 4:08 PM

## 2015-10-01 NOTE — H&P (Signed)
Date: 10/01/2015               Patient Name:  Franklin Anderson MRN: [id number]  DOB: 06/22/1970 Age / Sex: 45 y.o., male   PCP: No primary care provider on file.         Medical Service: Internal Medicine Teaching Service         Attending Physician: Dr. Lyndal Pulley, MD    First Contact: MS4 Darryl [place] Pager: [phone]  Second Contact: Dr. Gara Kroner Pager: [phone]       After Hours (After 5p/  First Contact Pager: [phone]  weekends / holidays): Second Contact Pager: [phone]   Chief Complaint: SOB  History of Present Illness: 18M with PMHx of rheumatic fever, polysubstance abuse and HTN who presents w/ SOB that started 2 days ago. He was sleeping when he first noticed SOB that woke him up from sleep. He does use any inhalers at home but tried his mother's home oxygen which helped w/ breathing. He has substernal chest pain that occurs when coughing. Cough is productive of clear/white sputum. He vomited twice this morning due to increased cough.He reports subjective fever, chills, night sweats, sinus pressure, and post nasal drip. He is has DOE and can only walk 15-20 feet before getting SOB. He denies sick contacts, hemoptysis, orthopnea, and diarrhea.  He smokes 1.5PPD x 16 years and uses heroin which he last used this morning. He follows w/ a methadone clinic for heroin abuse and is on 90mg  of methadone which he last took this morning. He has never been dx w/ COPD before. He reports breathing has improved after nebulizer in the ED. CXR in the ED reveals hyperinflated lungs w/ flattened diaphragm, troponin negative.   Meds: No current facility-administered medications for this encounter.   Current Outpatient Prescriptions  Medication Sig Dispense Refill  . acetaminophen (TYLENOL) 325 MG tablet Take 650 mg by mouth every 6 (six) hours as needed for mild pain.    . Aspirin-Salicylamide-Caffeine (BC HEADACHE POWDER PO) Take 1 packet by mouth every 6 (six) hours as needed (pain).     Marland Kitchen ibuprofen (ADVIL,MOTRIN) 200 MG tablet Take 200 mg by mouth every 6 (six) hours as needed for moderate pain.      Allergies: Allergies as of 10/01/2015 - Review Complete 10/01/2015  Allergen Reaction Noted  . Penicillins Other (See Comments) 10/01/2015   History reviewed. No pertinent past medical history. Past Surgical History  Procedure Laterality Date  . Collapsed lung     No family history on file. Social History   Social History  . Marital Status: Divorced    Spouse Name: N/A  . Number of Children: N/A  . Years of Education: N/A   Occupational History  . Not on file.   Social History Main Topics  . Smoking status: Current Every Day Smoker  . Smokeless tobacco: Not on file  . Alcohol Use: No  . Drug Use: Yes    Special: IV     Comment: Heroin  . Sexual Activity: Not on file   Other Topics Concern  . Not on file   Social History Narrative  . Corporate investment banker, lives w/ mother.     Review of Systems: Review of Systems  Constitutional: Positive for fever, chills and weight loss (2 weeks due to heavier exertion at work).  Eyes: Negative.   Respiratory: Positive for cough, sputum production, shortness of breath and wheezing. Negative for hemoptysis.   Cardiovascular: Positive for chest pain (SS from  cough). Negative for orthopnea and leg swelling.  Gastrointestinal: Positive for nausea (2 episodes of vomiting this morning) and vomiting. Negative for diarrhea and constipation.  Musculoskeletal: Positive for neck pain (chronic neck pain which he states is why he uses heroin).  Skin: Negative for rash.  Neurological: Positive for headaches (frontal headache). Negative for weakness.     Physical Exam: Blood pressure 126/88, pulse 87, temperature 98.4 F (36.9 C), temperature source Oral, resp. rate 22, SpO2 86 %. Physical Exam  Constitutional: He appears well-developed and well-nourished. No distress.  HENT:  Head: Normocephalic and atraumatic.  Nose:  Nose normal.  Mouth/Throat: Oropharynx is clear and moist.  Post nasal drip, tender to palpation of frontal, maxillary and ethmoid sinuses    Eyes: Conjunctivae and EOM are normal. Pupils are equal, round, and reactive to light. Right eye exhibits no discharge. Left eye exhibits no discharge. No scleral icterus.  Cardiovascular: Normal rate, regular rhythm and normal heart sounds.  Exam reveals no gallop and no friction rub.   No murmur heard. Pulmonary/Chest: No respiratory distress. He has wheezes (diffuse wheezing throughout).  Abdominal: Soft. Bowel sounds are normal. He exhibits no distension and no mass. There is no tenderness. There is no rebound and no guarding.  Musculoskeletal: He exhibits no edema.  Skin: Skin is warm and dry. No rash noted. He is not diaphoretic. No erythema. No pallor.     Lab results: Basic Metabolic Panel:  Recent Labs  16/10/96 1334  NA 137  K 4.4  CL 99*  CO2 28  GLUCOSE 126*  BUN 13  CREATININE 0.77  CALCIUM 9.4   CBC:  Recent Labs  10/01/15 1334  WBC 8.9  HGB 14.9  HCT 45.2  MCV 90.4  PLT 256    Imaging results:  Dg Chest Portable 1 View  10/01/2015  CLINICAL DATA:  Shortness of breath and cough for 2 nights, tachypnea, wheezing, history smoking, heroin use EXAM: PORTABLE CHEST 1 VIEW COMPARISON:  11/01/2008 FINDINGS: Normal heart size, mediastinal contours, and pulmonary vascularity. Lungs hyperinflated but clear. No pulmonary infiltrate, pleural effusion or pneumothorax. BILATERAL nipple rings. EKG leads project over chest. Osseous structures unremarkable. IMPRESSION: Hyperinflated lungs without acute infiltrate. Electronically Signed   By: Ulyses Southward M.D.   On: 10/01/2015 15:01    Other results: EKG Interpretation  Date/Time:  Tuesday October 01 2015 13:29:13 EDT Ventricular Rate:  89 PR Interval:  130 QRS Duration: 84 QT Interval:  358 QTC Calculation: 435 R Axis:   85 Text Interpretation:  Normal sinus rhythm Biatrial  enlargement Left ventricular hypertrophy Abnormal ECG peaked T waves, Minimal voltage criteria for LVH, may be normal variant No significant change since last tracing no reciprocal change, d/w Dr. Mayford Knife Confirmed by Manus Gunning  MD, STEPHEN [phone]) on 10/01/2015 1:44:59 PM  Assessment & Plan by Problem: Principal Problem:   Shortness of breath Active Problems:   HTN (hypertension)   Methadone use (HCC)   Heroin use   Tobacco abuse  1. Shortness of breath-- Pt has had SOB x 2 days w/ cough productive of clear/white sputum. He has never been dx w/ COPD but has findings suggestive of it on CXR. He is afebrile w/o leukocytosis. He was sating at 89-90% on RA during exam, lungs w/ diffuse wheezing, no murmurs on cardiac exam, no splinter hemorrhages, and euvolemic on exam. Likely pt has undx COPD presented with a COPD exacerbation. Other DDx include acute bronchitis and septic embolic from IV drug use and hx of rheumatic  fever.  - admit to med surg - duonebs q4h - albuterol neb q4h prn and albuterol inhaler q2h prn - prednisone 60mg  daily x 4 more days - O2 therapy to maintain 88-92% saturation - tessalon pearls BID prn - flexeril 7.5mg  TID prn for chest pain and headache from coughing - HIV and Hep C labs ordered - will need outpatient PFTs  2. Heroin abuse- last used heroin this morning. Follows w/ [facility], reports being on methadone 90mg  qAM which he had this morning. Declines SW consult for cessation. - will call [facility] tomorrow and verify dose to be given in the am.   3. HTN- previously on lisinopril 10mg . SBP 150.  - can d/c home with norvasc 5mg    4. Tobacco abuse-- current 1.5 PPD smoker x 16 years.  - nicotine patch 21mg   Dispo: Disposition is deferred at this time, awaiting improvement of current medical problems.   The patient does not have a current PCP (No primary care provider on file.) and does not know need [facility] after  discharge.  The patient does not have transportation limitations that hinder transportation to clinic appointments.  Signed: Denton Brickiana M Truong, MD 10/01/2015, 3:33 PM

## 2015-10-01 NOTE — ED Notes (Signed)
IV attempted x2 without success.

## 2015-10-01 NOTE — ED Notes (Signed)
IV team remains at bedside, will transport once completed

## 2015-10-01 NOTE — ED Notes (Signed)
Pt is here with shortness of breath and cough for 2 nights.  Tachypneic at triage, wheezing.  Pt hx of heroin use and smoker.  Dr. Manus Gunningancour came an assessed pt at triage.  Neb started

## 2015-10-01 NOTE — ED Notes (Signed)
IV team at bedside 

## 2015-10-01 NOTE — ED Provider Notes (Signed)
CSN: [id number]     Arrival date & time 10/01/15  1319 History   First MD Initiated Contact with Patient 10/01/15 1416     Chief Complaint  Patient presents with  . Shortness of Breath    Franklin Anderson is a 45 y.o. male who presents to the emergency department complaining of cough, wheezing, chest tightness, shortness of breath and chest pain for 2 days. The patient is a heavy smoker. On arrival to the emergency department he received a breathing treatment en route he reports this helped his wheezing tremendously. He states he is still feeling short of breath. He complains of substernal chest pain only with coughing. He reports having some intermittent chills over the past couple days. No known fever. No history of asthma. No history of COPD or emphysema. He is a heavy smoker. 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 Patient denies history of DVT or PE. He denies history of blood clotting disorders such as factor V Leiden, protein C or S deficiency. He denies any recent long travel or history of cancer. He denies fevers, hemoptysis, abdominal pain, nausea, vomiting, diarrhea, leg pain, leg swelling, or rashes.  Patient is a 45 y.o. male presenting with shortness of breath. The history is provided by the patient. No language interpreter was used.  Shortness of Breath Associated symptoms: chest pain, cough and wheezing   Associated symptoms: no abdominal pain, no fever, no headaches, no neck pain, no rash, no sore throat and no vomiting     History reviewed. No pertinent past medical history. Past Surgical History  Procedure Laterality Date  . Collapsed lung     No family history on file. Social History  Substance Use Topics  . Smoking status: Current Every Day Smoker  . Smokeless tobacco: None  . Alcohol Use: No    Review of Systems  Constitutional: Positive for chills. Negative for fever.  HENT: Negative for congestion and sore throat.   Eyes: Negative for visual disturbance.   Respiratory: Positive for cough, chest tightness, shortness of breath and wheezing.   Cardiovascular: Positive for chest pain. Negative for palpitations and leg swelling.  Gastrointestinal: Negative for nausea, vomiting, abdominal pain and diarrhea.  Genitourinary: Negative for dysuria.  Musculoskeletal: Negative for back pain and neck pain.  Skin: Negative for rash.  Neurological: Negative for syncope, light-headedness and headaches.      Allergies  Penicillins  Home Medications   Prior to Admission medications   Medication Sig Start Date End Date Taking? Authorizing Provider  acetaminophen (TYLENOL) 325 MG tablet Take 650 mg by mouth every 6 (six) hours as needed for mild pain.   Yes Historical Provider, MD  Aspirin-Salicylamide-Caffeine (BC HEADACHE POWDER PO) Take 1 packet by mouth every 6 (six) hours as needed (pain).   Yes Historical Provider, MD  ibuprofen (ADVIL,MOTRIN) 200 MG tablet Take 200 mg by mouth every 6 (six) hours as needed for moderate pain.   Yes Historical Provider, MD   BP 126/88 mmHg  Pulse 87  Temp(Src) 98.4 F (36.9 C) (Oral)  Resp 22  SpO2 86% Physical Exam  Constitutional: He appears well-developed and well-nourished. No distress.  Nontoxic appearing.  HENT:  Head: Normocephalic and atraumatic.  Mouth/Throat: Oropharynx is clear and moist.  Eyes: Conjunctivae are normal. Pupils are equal, round, and reactive to light. Right eye exhibits no discharge. Left eye exhibits no discharge.  Neck: Normal range of motion. Neck supple. No JVD present. No tracheal deviation present.  Cardiovascular: Regular  rhythm, normal heart sounds and intact distal pulses.  Exam reveals no gallop and no friction rub.   No murmur heard. Tachycardic with a heart rate in the 108. Bilateral radial and posterior tibialis pulses are intact.  Pulmonary/Chest: No stridor. No respiratory distress. He has wheezes.  Respirations 28. Wheezes diffusely with diminished lung sounds to  his bilateral bases. No rales or rhonchi.  With the patient has an oxygen saturation down to 86% on room air which improved with some deep breathing. Patient being started on another breathing treatment.   Abdominal: Soft. There is no tenderness. There is no guarding.  Musculoskeletal: He exhibits no edema or tenderness.  No lower extremity edema or tenderness.  Lymphadenopathy:    He has no cervical adenopathy.  Neurological: He is alert. Coordination normal.  Skin: Skin is warm and dry. No rash noted. He is not diaphoretic. No erythema. No pallor.  Psychiatric: He has a normal mood and affect. His behavior is normal.  Nursing note and vitals reviewed.   ED Course  Procedures (including critical care time) Labs Review Labs Reviewed  BASIC METABOLIC PANEL - Abnormal; Notable for the following:    Chloride 99 (*)    Glucose, Bld 126 (*)    All other components within normal limits  CBC  URINE RAPID DRUG SCREEN, HOSP PERFORMED  I-STAT TROPOININ, ED    Imaging Review Dg Chest Portable 1 View  10/01/2015  CLINICAL DATA:  Shortness of breath and cough for 2 nights, tachypnea, wheezing, history smoking, heroin use EXAM: PORTABLE CHEST 1 VIEW COMPARISON:  11/01/2008 FINDINGS: Normal heart size, mediastinal contours, and pulmonary vascularity. Lungs hyperinflated but clear. No pulmonary infiltrate, pleural effusion or pneumothorax. BILATERAL nipple rings. EKG leads project over chest. Osseous structures unremarkable. IMPRESSION: Hyperinflated lungs without acute infiltrate. Electronically Signed   By: Ulyses Southward M.D.   On: 10/01/2015 15:01   I have personally reviewed and evaluated these images and lab results as part of my medical decision-making.   EKG Interpretation   Date/Time:  Tuesday October 01 2015 13:29:13 EDT Ventricular Rate:  89 PR Interval:  130 QRS Duration: 84 QT Interval:  358 QTC Calculation: 435 R Axis:   85 Text Interpretation:  Normal sinus rhythm Biatrial  enlargement Left  ventricular hypertrophy Abnormal ECG peaked T waves, Minimal voltage  criteria for LVH, may be normal variant No significant change since last  tracing no reciprocal change, d/w Dr. Mayford Knife Confirmed by Manus Gunning  MD,  STEPHEN [phone]) on 10/01/2015 1:44:59 PM      Filed Vitals:   10/01/15 1339 10/01/15 1415 10/01/15 1506  BP: 156/109 155/92 126/88  Pulse: 92 108 87  Temp: 98.4 F (36.9 C)    TempSrc: Oral    Resp: 32 30 22  SpO2: 89% 89% 86%     MDM   Meds given in ED:  Medications  albuterol (PROVENTIL) (2.5 MG/3ML) 0.083% nebulizer solution 5 mg (5 mg Nebulization Given 10/01/15 1336)  albuterol (PROVENTIL) (2.5 MG/3ML) 0.083% nebulizer solution 5 mg (5 mg Nebulization Given 10/01/15 1508)  ipratropium (ATROVENT) nebulizer solution 1 mg (1 mg Nebulization Given 10/01/15 1509)  aspirin chewable tablet 324 mg (324 mg Oral Given 10/01/15 1508)  predniSONE (DELTASONE) tablet 60 mg (60 mg Oral Given 10/01/15 1510)    New Prescriptions   No medications on file    Final diagnoses:  Hypoxia  Reactive airway disease, unspecified asthma severity, with acute exacerbation  Heroin abuse   This is a 45 y.o. male  who presents to the emergency department complaining of cough, wheezing, chest tightness, shortness of breath and chest pain for 2 days. The patient is a heavy smoker. On arrival to the emergency department he received a breathing treatment en route he reports this helped his wheezing tremendously. He states he is still feeling short of breath. He complains of substernal chest pain only with coughing. He reports having some intermittent chills over the past couple days. No known fever. No history of asthma. No history of COPD or emphysema. He is a heavy smoker. 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  On exam the patient is afebrile nontoxic appearing. He has some slight increased work of breathing. He has wheezing noted diffusely with diminished lung sounds bilateral  bases. While interviewing the patient has oxygen saturation dropped to 86% on room air and then improved with some deep breaths. He is being placed on a breathing treatment.  EKG shows no STEMI. Troponin is 0. BMP is unremarkable. CBC is within normal limits. Chest x-ray shows hyperinflated lungs without acute infiltrate. With patient's wheezing, chest tightness and improvement with nebulization treatments I'm concerned for some sort of reactive airway disease. Irises COPD or asthma exacerbation. The patient does not have history of asthma. I have low suspicion for PE with this presentation. Patient is still having episodes of hypoxia after breathing treatments. Will admit for hypoxia. Patient received prednisone 60 mg in ED.  The patient is in agreement with admission.   I consulted with internal medicine teaching service Dr. Gara Kroner who accepted the patient for admission. She will put in her own admission orders.   This patient was discussed with Dr. Clydene Pugh who agrees with assessment and plan.    Everlene Farrier, PA-C 10/01/15 1543  Lyndal Pulley, MD 10/01/15 [phone]

## 2015-10-02 ENCOUNTER — Encounter (HOSPITAL_COMMUNITY): Payer: Self-pay

## 2015-10-02 DIAGNOSIS — R0602 Shortness of breath: Secondary | ICD-10-CM

## 2015-10-02 LAB — HIV ANTIBODY (ROUTINE TESTING W REFLEX): HIV Screen 4th Generation wRfx: NONREACTIVE

## 2015-10-02 MED ORDER — BENZONATATE 100 MG PO CAPS: 100.0000 mg | ORAL_CAPSULE | Freq: Two times a day (BID) | ORAL | Status: DC

## 2015-10-02 MED ORDER — METHADONE HCL 10 MG PO TABS
90.0000 mg | ORAL_TABLET | Freq: Every day | ORAL | Status: DC
  Administered 2015-10-02: 90 mg via ORAL
  Filled 2015-10-02: qty 9

## 2015-10-02 MED ORDER — PREDNISONE 20 MG PO TABS: 60.0000 mg | ORAL_TABLET | Freq: Every day | ORAL | Status: DC

## 2015-10-02 MED ORDER — CYCLOBENZAPRINE HCL 7.5 MG PO TABS: 7.5000 mg | ORAL_TABLET | Freq: Three times a day (TID) | ORAL | Status: DC | PRN

## 2015-10-02 NOTE — Care Management Note (Signed)
Case Management Note  Patient Details  Name: Si Gaulaul R Geck MRN: [id number] Date of Birth: [birth year]  Subjective/Objective:                    Action/Plan:  Ref[facility] , called same there are no appointments available.   [facility] [phone], [address] first available appointment is November 04, 2015 at 0845 .   MATCH letter given and explained .  Patient voiced understanding of above .  Expected Discharge Date:                  Expected Discharge Plan:  Home/Self Care  In-House Referral:     Discharge planning Services  CM Consult, [facility]  Post Acute Care Choice:    Choice offered to:  Patient  DME Arranged:    DME Agency:     HH Arranged:    HH Agency:     Status of Service:  Completed, signed off  Medicare Important Message Given:    Date Medicare IM Given:    Medicare IM give by:    Date Additional Medicare IM Given:    Additional Medicare Important Message give by:     If discussed at Long Length of Stay Meetings, dates discussed:    Additional Comments:  Kingsley PlanWile, Cailyn Houdek Marie, RN 10/02/2015, 11:15 AM

## 2015-10-02 NOTE — Discharge Summary (Signed)
Name: Franklin Anderson MRN: [id number] DOB: 12/01/1970 45 y.o. PCP: No primary care provider on file.  Date of Admission: 10/01/2015  2:00 PM Date of Discharge: 10/02/2015 Attending Physician: Inez CatalinaEmily B Mullen, MD  Discharge Diagnosis: Principal Problem:   Shortness of breath Active Problems:   HTN (hypertension)   Methadone use (HCC)   Heroin use   Tobacco abuse  Discharge Medications:   Medication List    TAKE these medications        acetaminophen 325 MG tablet  Commonly known as:  TYLENOL  Take 650 mg by mouth every 6 (six) hours as needed for mild pain.     BC HEADACHE POWDER PO  Take 1 packet by mouth every 6 (six) hours as needed (pain).     benzonatate 100 MG capsule  Commonly known as:  TESSALON  Take 1 capsule (100 mg total) by mouth 2 (two) times daily.     cyclobenzaprine 7.5 MG tablet  Commonly known as:  FEXMID  Take 1 tablet (7.5 mg total) by mouth 3 (three) times daily as needed for muscle spasms.     ibuprofen 200 MG tablet  Commonly known as:  ADVIL,MOTRIN  Take 200 mg by mouth every 6 (six) hours as needed for moderate pain.     methadone 10 MG tablet  Commonly known as:  DOLOPHINE  Take 90 mg by mouth daily.     predniSONE 20 MG tablet  Commonly known as:  DELTASONE  Take 3 tablets (60 mg total) by mouth daily with breakfast.        Disposition and follow-up:   [name] [facility] Good [facility].  At the hospital follow up visit please address:  1.  Please order pulmonary lung function testing and address history of hypertension.   2.  Labs / imaging needed at time of follow-up: none  3.  Pending labs/ test needing follow-up: hepatitis c antibody   Procedures Performed:  Dg Chest Portable 1 View  10/01/2015  CLINICAL DATA:  Shortness of breath and cough for 2 nights, tachypnea, wheezing, history smoking, heroin use EXAM: PORTABLE CHEST 1 VIEW COMPARISON:  11/01/2008 FINDINGS: Normal heart  size, mediastinal contours, and pulmonary vascularity. Lungs hyperinflated but clear. No pulmonary infiltrate, pleural effusion or pneumothorax. BILATERAL nipple rings. EKG leads project over chest. Osseous structures unremarkable. IMPRESSION: Hyperinflated lungs without acute infiltrate. Electronically Signed   By: Ulyses SouthwardMark  Boles M.D.   On: 10/01/2015 15:01     Admission HPI: 42M with PMHx of rheumatic fever, polysubstance abuse and HTN who presents w/ SOB that started 2 days ago. He was sleeping when he first noticed SOB that woke him up from sleep. He does use any inhalers at home but tried his mother's home oxygen which helped w/ breathing. He has substernal chest pain that occurs when coughing. Cough is productive of clear/white sputum. He vomited twice this morning due to increased cough.He reports subjective fever, chills, night sweats, sinus pressure, and post nasal drip. He is has DOE and can only walk 15-20 feet before getting SOB. He denies sick contacts, hemoptysis, orthopnea, and diarrhea.  He smokes 1.5PPD x 16 years and uses heroin which he last used this morning. He follows w/ a methadone clinic for heroin abuse and is on 90mg  of methadone which he last took this morning. He has never been dx w/ COPD before. He reports breathing has improved after nebulizer in the ED. CXR in the ED  reveals hyperinflated lungs w/ flattened diaphragm, troponin negative.   Hospital Course by problem list: Principal Problem:   Shortness of breath Active Problems:   HTN (hypertension)   Methadone use (HCC)   Heroin use   Tobacco abuse   1. Shortness of breath-- Patient has never been dx w/ COPD but has findings suggestive of it on CXR including hyperinflated lungs and flattened diaphragm. He is afebrile w/o leukocytosis. He was sating at 89-90% on RA during exam on admission, lungs w/ diffuse wheezing, no murmurs on cardiac exam, no splinter hemorrhages/ osler nodes, janeway lesions, and euvolemic on exam.  Likely w/ his preceding sx of post nasal drip and malaise he has a viral URI w/ bronchospasm vs COPD exacerbation. Other DDx include acute bronchitis and septic embolic from IV drug use and hx of rheumatic fever. Started on prednisone  daily for 5 day course, tessalon pearls BID prn, flexeril 7.5mg  TID prn for chest pain and headache from coughing. HIV negative. Hep C antibody pending. Will need outpatient PFTs. Consider ordered an ECHO due to IV drug use w/ hx of childhood rheumatic fever. Rx for albuterol inhaler given. Ambulated on room air on day of discharge with oxygen saturation of 91%.   2. Heroin abuse-  Follows w/ [facility]. His dose of methadone  daily was verified w/ [facility].   3. HTN- mildly elevated this admission. Previously on lisinopril . Consider starting on norvasc or HCTZ if BP elevated.    Discharge Vitals:   BP 143/82 mmHg  Pulse 69  Temp(Src) 97.6 F (36.4 C) (Oral)  Resp 20  SpO2 95%  Discharge Labs:  Results for orders placed or performed during the hospital encounter of 10/01/15 (from the past 24 hour(s))  Basic metabolic panel     Status: Abnormal   Collection Time: 10/01/15  1:34 PM  Result Value Ref Range   Sodium 137 135 - 145 mmol/L   Potassium 4.4 3.5 - 5.1 mmol/L   Chloride 99 (L) 101 - 111 mmol/L   CO2 28 22 - 32 mmol/L   Glucose, Bld 126 (H) 65 - 99 mg/dL   BUN 13 6 - 20 mg/dL   Creatinine, Ser 1.61 0.61 - 1.24 mg/dL   Calcium 9.4 8.9 - 09.6 mg/dL   GFR calc non Af Amer >60 >60 mL/min   GFR calc Af Amer >60 >60 mL/min   Anion gap 10 5 - 15  CBC     Status: None   Collection Time: 10/01/15  1:34 PM  Result Value Ref Range   WBC 8.9 4.0 - 10.5 K/uL   RBC 5.00 4.22 - 5.81 MIL/uL   Hemoglobin 14.9 13.0 - 17.0 g/dL   HCT 04.5 40.9 - 81.1 %   MCV 90.4 78.0 - 100.0 fL   MCH 29.8 26.0 - 34.0 pg   MCHC 33.0 30.0 - 36.0 g/dL   RDW 91.4 78.2 - 95.6 %   Platelets 256 150 - 400 K/uL  I-stat troponin, ED      Status: None   Collection Time: 10/01/15  1:55 PM  Result Value Ref Range   Troponin i, poc 0.00 0.00 - 0.08 ng/mL   Comment 3          HIV antibody     Status: None   Collection Time: 10/01/15  4:56 PM  Result Value Ref Range   HIV Screen 4th Generation wRfx Non Reactive Non Reactive  Urine rapid drug screen (hosp performed)  Status: Abnormal   Collection Time: 10/01/15  8:42 PM  Result Value Ref Range   Opiates POSITIVE (A) NONE DETECTED   Cocaine NONE DETECTED NONE DETECTED   Benzodiazepines NONE DETECTED NONE DETECTED   Amphetamines NONE DETECTED NONE DETECTED   Tetrahydrocannabinol NONE DETECTED NONE DETECTED   Barbiturates NONE DETECTED NONE DETECTED    Signed: Denton Brick, MD 10/02/2015, 10:07 AM    Services Ordered on Discharge: none Equipment Ordered on Discharge: none

## 2015-10-02 NOTE — Discharge Instructions (Signed)
Take prednisone 60mg  once daily with breakfast for 3 more days.  You can use tessalon pearls twice a day as needed for your cough.   You can take flexeril 7.5mg  three times a day as needed for your chest and rib pain from your cough.   Make [facility] to further manage your high blood pressure and to get pulmonary lung function testing to look for COPD.   STOP smoking!

## 2015-10-02 NOTE — Progress Notes (Signed)
Subjective:  Pt feels better compared to admission, still having chest soreness from cough. Able to walk to his car last night w/o difficulty.   Objective: Vital signs in last 24 hours: Filed Vitals:   10/01/15 1600 10/01/15 1652 10/01/15 2146 10/02/15 0426  BP: 151/93 129/84 134/88 143/82  Pulse: 104 79 64 69  Temp:  98 F (36.7 C) 98.4 F (36.9 C) 97.6 F (36.4 C)  TempSrc:  Oral Oral Oral  Resp: 21 21 20 20   SpO2: 90% 94% 95% 95%   Weight change:   Intake/Output Summary (Last 24 hours) at 10/02/15 1001 Last data filed at 10/02/15 0951  Gross per 24 hour  Intake    700 ml  Output    200 ml  Net    500 ml   General: NAD, laying in bed comfortably Lungs: bibasilar wheezing, improved air movement Cardiac: RRR, no murmurs,rubs, gallops GI: soft, active bowel sounds, non TTP Neuro: CN II-XII grossly intact Skin: warm and dry  Lab Results: Basic Metabolic Panel:  Recent Labs Lab 10/01/15 1334  NA 137  K 4.4  CL 99*  CO2 28  GLUCOSE 126*  BUN 13  CREATININE 0.77  CALCIUM 9.4   CBC:  Recent Labs Lab 10/01/15 1334  WBC 8.9  HGB 14.9  HCT 45.2  MCV 90.4  PLT 256   Urine Drug Screen: Drugs of Abuse     Component Value Date/Time   LABOPIA POSITIVE* 10/01/2015 2042   COCAINSCRNUR NONE DETECTED 10/01/2015 2042   LABBENZ NONE DETECTED 10/01/2015 2042   AMPHETMU NONE DETECTED 10/01/2015 2042   THCU NONE DETECTED 10/01/2015 2042   LABBARB NONE DETECTED 10/01/2015 2042    Studies/Results: Dg Chest Portable 1 View  10/01/2015  CLINICAL DATA:  Shortness of breath and cough for 2 nights, tachypnea, wheezing, history smoking, heroin use EXAM: PORTABLE CHEST 1 VIEW COMPARISON:  11/01/2008 FINDINGS: Normal heart size, mediastinal contours, and pulmonary vascularity. Lungs hyperinflated but clear. No pulmonary infiltrate, pleural effusion or pneumothorax. BILATERAL nipple rings. EKG leads project over chest. Osseous structures unremarkable. IMPRESSION:  Hyperinflated lungs without acute infiltrate. Electronically Signed   By: Ulyses SouthwardMark  Boles M.D.   On: 10/01/2015 15:01   Medications: I have reviewed the patient's current medications. Scheduled Meds: . benzonatate  100 mg Oral BID  . enoxaparin (LOVENOX) injection  40 mg Subcutaneous Q24H  . ipratropium-albuterol  3 mL Nebulization Q4H  . methadone  90 mg Oral Daily  . nicotine  21 mg Transdermal Daily  . predniSONE  60 mg Oral Q breakfast   Continuous Infusions:  PRN Meds:.acetaminophen **OR** acetaminophen, albuterol, cyclobenzaprine Assessment/Plan: Principal Problem:   Shortness of breath Active Problems:   HTN (hypertension)   Methadone use (HCC)   Heroin use   Tobacco abuse   1. Shortness of breath-- improved, pt feels better and is requesting to go home today. will wean O2 to room air and ambulate pt while checking O2 sat.  - rx for albuterol inhaler - prednisone 60mg  daily x  3 more days - tessalon pearls BID prn - will need outpatient PFTs  2. Heroin abuse- methadone 90mg  qAM, dose verified w/ [facility].   3. HTN- BP mildly elevated this morning, he has a known hx of HTN. F/u outpatient for BP meds, will recommend starting on HCTZ on d/c.    4. Tobacco abuse-- current 1.5 PPD smoker x 16 years.  - nicotine patch 21mg   5. Hx of rheumatic fever--recommend outpatient ECHO w/  current IVDU.   Dispo: Disposition is deferred at this time, awaiting improvement of current medical problems.   The patient does not have a current PCP (No primary care provider on file.) and does not know need an [facility] hospital follow-up appointment after discharge.  The patient does not have transportation limitations that hinder transportation to clinic appointments.  .Services Needed at time of discharge: Y = Yes, Blank = No PT:   OT:   RN:   Equipment:   Other:       Denton Brick, MD 10/02/2015, 10:01 AM

## 2015-10-02 NOTE — Progress Notes (Signed)
Offered Pt. A bath. Pt refused stated he is hoping to go home today. I inform pt. If he changed his mind just to let me know and also with the linen change

## 2015-10-02 NOTE — Progress Notes (Signed)
SATURATION QUALIFICATIONS: (This note is used to comply with regulatory documentation for home oxygen)  Patient Saturations on Room Air at Rest = 96%  Patient Saturations on Room Air while Ambulating = 91%  Patient Saturations on 2 Liters of oxygen while Ambulating =96%  Please briefly explain why patient needs home oxygen: does not need oxygen at home

## 2015-10-02 NOTE — Progress Notes (Signed)
Subjective: No acute complaints overnight.  Shortness of breath has improved by continues to have cough.  Received dose of methadone this morning after nurse reported withdrawal symptoms.   Objective: Vital signs in last 24 hours: Filed Vitals:   10/01/15 1600 10/01/15 1652 10/01/15 2146 10/02/15 0426  BP: 151/93 129/84 134/88 143/82  Pulse: 104 79 64 69  Temp:  98 F (36.7 C) 98.4 F (36.9 C) 97.6 F (36.4 C)  TempSrc:  Oral Oral Oral  Resp: SpO2: 90% 94% 95% 95%   Weight change:   Intake/Output Summary (Last 24 hours) at 10/02/15 0750 Last data filed at 10/02/15 0445  Gross per 24 hour  Intake    460 ml  Output    200 ml  Net    260 ml   BP 143/82 mmHg  Pulse 80  Temp(Src) 97.6 F (36.4 C) (Oral)  Resp 19  SpO2 91% General appearance: alert, cooperative and no distress Lungs: wheezes bilaterally Heart: regular rate and rhythm, S1, S2 normal, no murmur, click, rub or gallop Abdomen: soft, non-tender; bowel sounds normal; no masses,  no organomegaly Extremities: extremities normal, atraumatic, no cyanosis or edema Lab Results:  HIV Screen: Nonreactive  Urine Drug Screen: Drugs of Abuse     Component Value Date/Time   LABOPIA POSITIVE* 10/01/2015 2042   COCAINSCRNUR NONE DETECTED 10/01/2015 2042   LABBENZ NONE DETECTED 10/01/2015 2042   AMPHETMU NONE DETECTED 10/01/2015 2042   THCU NONE DETECTED 10/01/2015 2042   LABBARB NONE DETECTED 10/01/2015 2042     Medications: I have reviewed the patient's current medications. Scheduled Meds: . benzonatate  100 mg Oral BID  . enoxaparin (LOVENOX) injection  40 mg Subcutaneous Q24H  . ipratropium-albuterol  3 mL Nebulization Q4H  . methadone  90 mg Oral Daily  . nicotine  21 mg Transdermal Daily  . predniSONE  60 mg Oral Q breakfast   Continuous Infusions:  PRN Meds:.acetaminophen **OR** acetaminophen, albuterol, cyclobenzaprine Assessment/Plan: Principal Problem:   Shortness of breath Active  Problems:   HTN (hypertension)   Methadone use (HCC)   Heroin use   Tobacco abuse   Franklin Anderson is a 45 year old smoker with a history of rheumatic fever and current heroin use who presented with dyspnea and cough.  Most likely due to viral URI induced exacerbation of undiagnosed chronic obstructive lung disease.  1. Shortness of Breath/cough- Improved.  Saturating 95% on 2L [place].  Will wean the room air and evaluate O2 saturation with assisted ambulation -- continue duoneb and prednisone for bronchospasm and inflammation -- continue tessalon perles for cough -- wean to room air -- evaluate O2 saturation after assisted ambulation  2.  Hypertension:  Hypertensive s/p SABA nebulizer treatment -- continue to monitor -- encourage establishment of care with PCP for management  3.  Herion Abuse- current user:  Negative HIV screen.  Established with methadone clinic (M-Sat).  Dose of  Methadone  was confirmed with clinic. Dose of Methadone given this morning after patient reported to have withdrawal symptoms.  --  Cessation encouraged  4. Tobacco Abuse: current 1.5 pack/day smoker -- Cessation encouraged -- nicotine patch offered  5. Back/neck pain -- PRN Flexeril 7.5mg   PPX:  -- DVT: Lovenox   Dispo:  Likely today pending evaluation of O2 saturation after assisted ambulation.  Established of care with PCP encouraged for follow-up and further workup of lung disease (PFTs) and blood pressure management.  Further work-up for possible cardiac abnormalities (Echo)  also encouraged.    This is a Psychologist, occupationalMedical Student Note.  The care of the patient was discussed with Dr. Danella Pentonruong and the assessment and plan formulated with their assistance.  Please see their attached note for official documentation of the daily encounter.   Franklin Earthlyaryl Juanantonio Stolar, Med Student 10/02/2015, 7:50 AM

## 2015-10-03 LAB — HEPATITIS C ANTIBODY (REFLEX): HCV Ab: >11 s/co ratio — ABNORMAL HIGH (ref 0.0–0.9)

## 2015-10-03 LAB — COMMENT2 - HEP PANEL

## 2015-11-04 ENCOUNTER — Ambulatory Visit: Payer: Self-pay | Admitting: Family Medicine

## 2015-11-07 ENCOUNTER — Ambulatory Visit: Payer: Self-pay | Admitting: Family Medicine

## 2016-04-22 ENCOUNTER — Emergency Department (HOSPITAL_COMMUNITY)
Admission: EM | Admit: 2016-04-22 | Discharge: 2016-04-22 | Disposition: A | Discharge status: Home / Self Care | Payer: Self-pay | Attending: Emergency Medicine | Admitting: Emergency Medicine

## 2016-04-22 ENCOUNTER — Encounter (HOSPITAL_COMMUNITY): Payer: Self-pay | Admitting: Emergency Medicine

## 2016-04-22 DIAGNOSIS — R1110 Vomiting, unspecified: Secondary | ICD-10-CM

## 2016-04-22 DIAGNOSIS — Z7982 Long term (current) use of aspirin: Secondary | ICD-10-CM | POA: Insufficient documentation

## 2016-04-22 DIAGNOSIS — F1193 Opioid use, unspecified with withdrawal: Secondary | ICD-10-CM

## 2016-04-22 DIAGNOSIS — I10 Essential (primary) hypertension: Secondary | ICD-10-CM | POA: Insufficient documentation

## 2016-04-22 DIAGNOSIS — F1910 Other psychoactive substance abuse, uncomplicated: Secondary | ICD-10-CM

## 2016-04-22 DIAGNOSIS — F1123 Opioid dependence with withdrawal: Secondary | ICD-10-CM | POA: Insufficient documentation

## 2016-04-22 DIAGNOSIS — F17210 Nicotine dependence, cigarettes, uncomplicated: Secondary | ICD-10-CM | POA: Insufficient documentation

## 2016-04-22 DIAGNOSIS — R110 Nausea: Secondary | ICD-10-CM

## 2016-04-22 DIAGNOSIS — R112 Nausea with vomiting, unspecified: Secondary | ICD-10-CM | POA: Insufficient documentation

## 2016-04-22 DIAGNOSIS — Z79899 Other long term (current) drug therapy: Secondary | ICD-10-CM | POA: Insufficient documentation

## 2016-04-22 DIAGNOSIS — R197 Diarrhea, unspecified: Secondary | ICD-10-CM | POA: Insufficient documentation

## 2016-04-22 HISTORY — DX: Other psychoactive substance abuse, uncomplicated: F19.10

## 2016-04-22 LAB — CBC
HCT: 47.1 % (ref 39.0–52.0)
HEMOGLOBIN: 16.9 g/dL (ref 13.0–17.0)
MCH: 32 pg (ref 26.0–34.0)
MCHC: 35.9 g/dL (ref 30.0–36.0)
MCV: 89.2 fL (ref 78.0–100.0)
Platelets: 230 10*3/uL (ref 150–400)
RBC: 5.28 MIL/uL (ref 4.22–5.81)
RDW: 12.2 % (ref 11.5–15.5)
WBC: 8.2 10*3/uL (ref 4.0–10.5)

## 2016-04-22 LAB — COMPREHENSIVE METABOLIC PANEL
ALBUMIN: 4.5 g/dL (ref 3.5–5.0)
ALT: 28 U/L (ref 17–63)
ANION GAP: 11 (ref 5–15)
AST: 30 U/L (ref 15–41)
Alkaline Phosphatase: 67 U/L (ref 38–126)
BUN: 10 mg/dL (ref 6–20)
CHLORIDE: 105 mmol/L (ref 101–111)
CO2: 23 mmol/L (ref 22–32)
Calcium: 10.1 mg/dL (ref 8.9–10.3)
Creatinine, Ser: 0.8 mg/dL (ref 0.61–1.24)
GFR calc Af Amer: >60 mL/min (ref >60)
GFR calc non Af Amer: >60 mL/min (ref >60)
GLUCOSE: 121 mg/dL — AB (ref 65–99)
POTASSIUM: 4.3 mmol/L (ref 3.5–5.1)
Sodium: 139 mmol/L (ref 135–145)
Total Bilirubin: 1 mg/dL (ref 0.3–1.2)
Total Protein: 7.9 g/dL (ref 6.5–8.1)

## 2016-04-22 LAB — RAPID URINE DRUG SCREEN, HOSP PERFORMED
AMPHETAMINES: NOT DETECTED
BENZODIAZEPINES: POSITIVE — AB
Barbiturates: NOT DETECTED
Cocaine: NOT DETECTED
Opiates: NOT DETECTED
TETRAHYDROCANNABINOL: NOT DETECTED

## 2016-04-22 LAB — ETHANOL: Alcohol, Ethyl (B): <5 mg/dL (ref <5)

## 2016-04-22 MED ORDER — HALOPERIDOL LACTATE 5 MG/ML IJ SOLN
2.0000 mg | Freq: Once | INTRAMUSCULAR | Status: AC
  Administered 2016-04-22: 2 mg via INTRAMUSCULAR
  Filled 2016-04-22: qty 1

## 2016-04-22 MED ORDER — ONDANSETRON HCL 4 MG PO TABS: 4.0000 mg | ORAL_TABLET | Freq: Four times a day (QID) | ORAL | 0 refills | Status: DC

## 2016-04-22 MED ORDER — CLONIDINE HCL 0.1 MG PO TABS
0.1000 mg | ORAL_TABLET | Freq: Once | ORAL | Status: AC
  Administered 2016-04-22: 0.1 mg via ORAL
  Filled 2016-04-22: qty 1

## 2016-04-22 MED ORDER — ONDANSETRON 4 MG PO TBDP
4.0000 mg | ORAL_TABLET | Freq: Once | ORAL | Status: AC
  Administered 2016-04-22: 4 mg via ORAL
  Filled 2016-04-22: qty 1

## 2016-04-22 NOTE — ED Provider Notes (Signed)
[facility]-EMERGENCY DEPT Provider Note   CSN: [id number] Arrival date & time: 04/22/16  1313     History   Chief Complaint Chief Complaint  Patient presents with  . Chills  . Abdominal Pain  . Nausea  . Addiction Problem  . Withdrawal    HPI Franklin Anderson is a 45 y.o. male.  The history is provided by the patient and medical records.  45 year old male with a past medical history of heroin abuse, shortness of breath, methadone use who presents today with what he reports to be withdrawal symptoms. This started today. States that he last used heroin 12 hours ago. He states that he took some Suboxone several hours ago that he got off the street. States that since then he started to have what he describes as withdrawal symptoms. States that he has "icewater in my veins", nausea, vomiting, abdominal cramping, diarrhea. States she's never had symptoms this bad before and he would really like something to make the symptoms stop. States that prior to 12 hours ago his last time using heroin was 6-8 hours prior to that. States that he uses heroin every 6-8 hours. States that he took the Suboxone that he offers one of the street because he is trying to get off of heroin. No worsening or alleviating factors.  Past Medical History:  Diagnosis Date  . Drug abuse   . H/O: rheumatic fever    Diagnosed at 45 y/o.  Resolved at 45 y/o    Patient Active Problem List   Diagnosis Date Noted  . Shortness of breath 10/01/2015  . SOB (shortness of breath) 10/01/2015  . HTN (hypertension) 10/01/2015  . Methadone use (HCC) 10/01/2015  . Heroin use 10/01/2015  . Tobacco abuse 10/01/2015    Past Surgical History:  Procedure Laterality Date  . collapsed lung         Home Medications    Prior to Admission medications   Medication Sig Start Date End Date Taking? Authorizing Provider  Aspirin-Salicylamide-Caffeine (BC HEADACHE POWDER PO) Take 1 packet by mouth every 6 (six) hours as needed (pain).    Yes Historical Provider, MD  ondansetron (ZOFRAN) 4 MG tablet Take 1 tablet (4 mg total) by mouth every 6 (six) hours. 04/22/16   Madolyn FriezeVijay Daniell Mancinas, MD    Family History Family History  Problem Relation Age of Onset  . Coronary artery disease Father   . COPD Mother     Social History Social History  Substance Use Topics  . Smoking status: Current Every Day Smoker    Packs/day: 1.50    Years: 16.00    Types: Cigarettes  . Smokeless tobacco: Current User  . Alcohol use No     Allergies   Penicillins   Review of Systems Review of Systems  Constitutional: Positive for chills and diaphoresis. Negative for fever.  HENT: Negative for ear pain and sore throat.   Respiratory: Positive for shortness of breath. Negative for cough.   Cardiovascular: Negative for chest pain and palpitations.  Gastrointestinal: Positive for diarrhea, nausea and vomiting. Negative for abdominal pain.  Genitourinary: Negative for dysuria and hematuria.  Musculoskeletal: Negative for arthralgias and back pain.  Skin: Negative for color change and rash.  Neurological: Positive for tremors. Negative for seizures and syncope.  Psychiatric/Behavioral: The patient is nervous/anxious.   All other systems reviewed and are negative.    Physical Exam Updated Vital Signs BP 148/95   Pulse (!) 123   Temp 97.9 F (36.6 C) (Oral)  Resp 16   Ht 6' (1.829 m)   Wt 68 kg   SpO2 100%   BMI 20.34 kg/m   Physical Exam  Constitutional:  Appears uncomfortable  HENT:  Head: Normocephalic and atraumatic.  Eyes: Conjunctivae and EOM are normal. Pupils are equal, round, and reactive to light.  Pupils are 4 mm bilaterally, reactive to light  Neck: Normal range of motion. Neck supple.  Cardiovascular: Normal rate and regular rhythm.   No murmur heard. Pulmonary/Chest: Effort normal and breath sounds normal. No respiratory distress. He has no wheezes.  Abdominal: Soft. There is no tenderness.  Musculoskeletal: He  exhibits no edema.  Neurological: He is alert.  Skin: Skin is warm and dry. He is not diaphoretic.  Psychiatric: His mood appears anxious. He is agitated.  Nursing note and vitals reviewed.    ED Treatments / Results  Labs (all labs ordered are listed, but only abnormal results are displayed) Labs Reviewed  COMPREHENSIVE METABOLIC PANEL - Abnormal; Notable for the following:       Result Value   Glucose, Bld 121 (*)    All other components within normal limits  RAPID URINE DRUG SCREEN, HOSP PERFORMED - Abnormal; Notable for the following:    Benzodiazepines POSITIVE (*)    All other components within normal limits  ETHANOL  CBC    EKG  EKG Interpretation  Date/Time:  Wednesday April 22 2016 13:36:25 EST Ventricular Rate:  81 PR Interval:  140 QRS Duration: 82 QT Interval:  372 QTC Calculation: 432 R Axis:   86 Text Interpretation:  Normal sinus rhythm Left ventricular hypertrophy Abnormal ECG Confirmed by Fayrene FearingJAMES  MD, MARK ([id number]) on 04/22/2016 1:41:01 PM       Radiology No results found.  Procedures Procedures (including critical care time)  Medications Ordered in ED Medications  cloNIDine (CATAPRES) tablet 0.1 mg (0.1 mg Oral Given 04/22/16 1524)  haloperidol lactate (HALDOL) injection 2 mg (2 mg Intramuscular Given 04/22/16 1524)  ondansetron (ZOFRAN-ODT) disintegrating tablet 4 mg (4 mg Oral Given 04/22/16 1524)     Initial Impression / Assessment and Plan / ED Course  I have reviewed the triage vital signs and the nursing notes.  Pertinent labs & imaging results that were available during my care of the patient were reviewed by me and considered in my medical decision making (see chart for details).  Clinical Course     Patient is [age] and presents today with concern for withdrawal symptoms after heroin use. On my evaluation, his symptoms do not seem that severe but given the amount of distress this is causing the patient I did give him a dose of  clonidine PO and Haldol IM in addition to Zofran.  On reevaluation, the patient had significant improvement in his symptoms. He is agreeable for discharge home with Zofran at this point. Advised not using street drugs in the future. He was in good condition with the time of discharge.  Final Clinical Impressions(s) / ED Diagnoses   Final diagnoses:  Polysubstance abuse  Opioid withdrawal (HCC)  Diarrhea, unspecified type  Nausea  Vomiting in adult    New Prescriptions Discharge Medication List as of 04/22/2016  5:30 PM    START taking these medications   Details  ondansetron (ZOFRAN) 4 MG tablet Take 1 tablet (4 mg total) by mouth every 6 (six) hours., Starting Wed 04/22/2016, Print         Madolyn FriezeVijay Fleeta Kunde, MD 04/22/16 [id number]    Laurence Spatesachel Morgan Little, MD 04/29/16 1235

## 2016-04-22 NOTE — ED Triage Notes (Signed)
Pt. Stated, I started shaking about a hour ago and MY last dose f Heroin was 12 hours ago, and Im having  Some reaction.Suboxin  About 2 hours ago. So Im not sure if the suboxin  Is causing the reaction. Im like withdrawing.

## 2017-12-21 ENCOUNTER — Encounter: Payer: Self-pay | Admitting: Internal Medicine

## 2017-12-21 ENCOUNTER — Ambulatory Visit: Payer: Self-pay | Admitting: Internal Medicine

## 2017-12-21 VITALS — BP 160/90 | HR 74 | Resp 12 | Ht 70.25 in | Wt 144.0 lb

## 2017-12-21 DIAGNOSIS — Z79899 Other long term (current) drug therapy: Secondary | ICD-10-CM

## 2017-12-21 DIAGNOSIS — F1910 Other psychoactive substance abuse, uncomplicated: Secondary | ICD-10-CM | POA: Insufficient documentation

## 2017-12-21 DIAGNOSIS — I10 Essential (primary) hypertension: Secondary | ICD-10-CM

## 2017-12-21 DIAGNOSIS — Z8679 Personal history of other diseases of the circulatory system: Secondary | ICD-10-CM | POA: Insufficient documentation

## 2017-12-21 DIAGNOSIS — Z87442 Personal history of urinary calculi: Secondary | ICD-10-CM

## 2017-12-21 LAB — POCT URINALYSIS DIPSTICK
BILIRUBIN UA: NEGATIVE
GLUCOSE UA: NEGATIVE
KETONES UA: NEGATIVE
LEUKOCYTES UA: NEGATIVE
Nitrite, UA: NEGATIVE
Protein, UA: NEGATIVE
RBC UA: NEGATIVE
Urobilinogen, UA: 0.2 E.U./dL
pH, UA: 5 (ref 5.0–8.0)

## 2017-12-21 MED ORDER — LISINOPRIL 10 MG PO TABS: 10.0000 mg | ORAL_TABLET | Freq: Every day | ORAL | 11 refills | Status: DC
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
## 2017-12-21 NOTE — Progress Notes (Signed)
LCSW completed new patient screening with patient in order to assess for mental health concerns and/or problems with social determinants of health (food, housing, transportation, interpersonal violence). Patient reported that while money is tight, he does not have any urgent concerns or needs around any of the SDOH.   He shared that he has been down and depressed for the past month, since the death of his grandmother, who he was very close with and was taking care of. He reported that he also cares for his mother. He shared that he struggles with sleep due to severe pain. He reported that he goes to a methadone clinic for pain management. He declined counseling, stating that he has too much on his plate currently and wants to focus on his medical/physical problems.

## 2017-12-22 LAB — BASIC METABOLIC PANEL
BUN / CREAT RATIO: 12 (ref 9–20)
BUN: 10 mg/dL (ref 6–24)
CO2: 27 mmol/L (ref 20–29)
CREATININE: 0.84 mg/dL (ref 0.76–1.27)
Calcium: 9.4 mg/dL (ref 8.7–10.2)
Chloride: 101 mmol/L (ref 96–106)
GFR, EST AFRICAN AMERICAN: 121 mL/min/{1.73_m2} (ref >59)
GFR, EST NON AFRICAN AMERICAN: 104 mL/min/{1.73_m2} (ref >59)
Glucose: 77 mg/dL (ref 65–99)
Potassium: 5 mmol/L (ref 3.5–5.2)
Sodium: 141 mmol/L (ref 134–144)

## 2018-01-05 ENCOUNTER — Other Ambulatory Visit: Payer: Self-pay

## 2018-01-05 VITALS — BP 122/80 | HR 70

## 2018-01-05 DIAGNOSIS — Z79899 Other long term (current) drug therapy: Secondary | ICD-10-CM

## 2018-01-05 DIAGNOSIS — I10 Essential (primary) hypertension: Secondary | ICD-10-CM

## 2018-01-12 ENCOUNTER — Other Ambulatory Visit: Payer: Self-pay

## 2018-03-21 ENCOUNTER — Encounter: Payer: Self-pay | Admitting: Internal Medicine

## 2018-03-22 ENCOUNTER — Encounter: Payer: Self-pay | Admitting: Internal Medicine

## 2019-01-20 ENCOUNTER — Other Ambulatory Visit: Payer: Self-pay | Admitting: Internal Medicine

## 2019-02-21 ENCOUNTER — Encounter (HOSPITAL_COMMUNITY): Payer: Self-pay | Admitting: Emergency Medicine

## 2019-02-21 ENCOUNTER — Emergency Department (HOSPITAL_COMMUNITY): Payer: Self-pay

## 2019-02-21 ENCOUNTER — Inpatient Hospital Stay (HOSPITAL_COMMUNITY)
Admission: EM | Admit: 2019-02-21 | Discharge: 2019-04-14 | DRG: 853 | Disposition: A | Discharge status: Home Health | Payer: Self-pay | Attending: Internal Medicine | Admitting: Internal Medicine

## 2019-02-21 DIAGNOSIS — J939 Pneumothorax, unspecified: Secondary | ICD-10-CM

## 2019-02-21 DIAGNOSIS — Z765 Malingerer [conscious simulation]: Secondary | ICD-10-CM

## 2019-02-21 DIAGNOSIS — G9340 Encephalopathy, unspecified: Secondary | ICD-10-CM | POA: Diagnosis present

## 2019-02-21 DIAGNOSIS — Y92009 Unspecified place in unspecified non-institutional (private) residence as the place of occurrence of the external cause: Secondary | ICD-10-CM

## 2019-02-21 DIAGNOSIS — F1110 Opioid abuse, uncomplicated: Secondary | ICD-10-CM | POA: Diagnosis present

## 2019-02-21 DIAGNOSIS — I2690 Septic pulmonary embolism without acute cor pulmonale: Secondary | ICD-10-CM | POA: Diagnosis present

## 2019-02-21 DIAGNOSIS — R931 Abnormal findings on diagnostic imaging of heart and coronary circulation: Secondary | ICD-10-CM

## 2019-02-21 DIAGNOSIS — I10 Essential (primary) hypertension: Secondary | ICD-10-CM | POA: Diagnosis present

## 2019-02-21 DIAGNOSIS — F1190 Opioid use, unspecified, uncomplicated: Secondary | ICD-10-CM | POA: Diagnosis present

## 2019-02-21 DIAGNOSIS — G061 Intraspinal abscess and granuloma: Secondary | ICD-10-CM | POA: Diagnosis present

## 2019-02-21 DIAGNOSIS — I313 Pericardial effusion (noninflammatory): Secondary | ICD-10-CM | POA: Diagnosis present

## 2019-02-21 DIAGNOSIS — T464X6A Underdosing of angiotensin-converting-enzyme inhibitors, initial encounter: Secondary | ICD-10-CM | POA: Diagnosis present

## 2019-02-21 DIAGNOSIS — Z20828 Contact with and (suspected) exposure to other viral communicable diseases: Secondary | ICD-10-CM | POA: Diagnosis present

## 2019-02-21 DIAGNOSIS — Z8249 Family history of ischemic heart disease and other diseases of the circulatory system: Secondary | ICD-10-CM

## 2019-02-21 DIAGNOSIS — Z681 Body mass index (BMI) 19 or less, adult: Secondary | ICD-10-CM

## 2019-02-21 DIAGNOSIS — R0600 Dyspnea, unspecified: Secondary | ICD-10-CM

## 2019-02-21 DIAGNOSIS — R64 Cachexia: Secondary | ICD-10-CM | POA: Diagnosis present

## 2019-02-21 DIAGNOSIS — L988 Other specified disorders of the skin and subcutaneous tissue: Secondary | ICD-10-CM

## 2019-02-21 DIAGNOSIS — K7460 Unspecified cirrhosis of liver: Secondary | ICD-10-CM | POA: Diagnosis present

## 2019-02-21 DIAGNOSIS — N179 Acute kidney failure, unspecified: Secondary | ICD-10-CM | POA: Diagnosis present

## 2019-02-21 DIAGNOSIS — B1920 Unspecified viral hepatitis C without hepatic coma: Secondary | ICD-10-CM | POA: Diagnosis present

## 2019-02-21 DIAGNOSIS — M4626 Osteomyelitis of vertebra, lumbar region: Secondary | ICD-10-CM | POA: Diagnosis present

## 2019-02-21 DIAGNOSIS — R680 Hypothermia, not associated with low environmental temperature: Secondary | ICD-10-CM | POA: Diagnosis not present

## 2019-02-21 DIAGNOSIS — K5900 Constipation, unspecified: Secondary | ICD-10-CM | POA: Diagnosis not present

## 2019-02-21 DIAGNOSIS — Z91128 Patient's intentional underdosing of medication regimen for other reason: Secondary | ICD-10-CM

## 2019-02-21 DIAGNOSIS — J189 Pneumonia, unspecified organism: Secondary | ICD-10-CM

## 2019-02-21 DIAGNOSIS — R159 Full incontinence of feces: Secondary | ICD-10-CM | POA: Diagnosis present

## 2019-02-21 DIAGNOSIS — A4101 Sepsis due to Methicillin susceptible Staphylococcus aureus: Principal | ICD-10-CM | POA: Diagnosis present

## 2019-02-21 DIAGNOSIS — Z66 Do not resuscitate: Secondary | ICD-10-CM | POA: Diagnosis not present

## 2019-02-21 DIAGNOSIS — Z880 Allergy status to penicillin: Secondary | ICD-10-CM

## 2019-02-21 DIAGNOSIS — L8990 Pressure ulcer of unspecified site, unspecified stage: Secondary | ICD-10-CM | POA: Insufficient documentation

## 2019-02-21 DIAGNOSIS — G9341 Metabolic encephalopathy: Secondary | ICD-10-CM | POA: Diagnosis present

## 2019-02-21 DIAGNOSIS — J869 Pyothorax without fistula: Secondary | ICD-10-CM

## 2019-02-21 DIAGNOSIS — M4646 Discitis, unspecified, lumbar region: Secondary | ICD-10-CM | POA: Diagnosis present

## 2019-02-21 DIAGNOSIS — N1330 Unspecified hydronephrosis: Secondary | ICD-10-CM | POA: Diagnosis present

## 2019-02-21 DIAGNOSIS — R1312 Dysphagia, oropharyngeal phase: Secondary | ICD-10-CM | POA: Diagnosis not present

## 2019-02-21 DIAGNOSIS — R52 Pain, unspecified: Secondary | ICD-10-CM

## 2019-02-21 DIAGNOSIS — D638 Anemia in other chronic diseases classified elsewhere: Secondary | ICD-10-CM | POA: Diagnosis present

## 2019-02-21 DIAGNOSIS — I2699 Other pulmonary embolism without acute cor pulmonale: Secondary | ICD-10-CM

## 2019-02-21 DIAGNOSIS — M48061 Spinal stenosis, lumbar region without neurogenic claudication: Secondary | ICD-10-CM | POA: Diagnosis present

## 2019-02-21 DIAGNOSIS — B9561 Methicillin susceptible Staphylococcus aureus infection as the cause of diseases classified elsewhere: Secondary | ICD-10-CM

## 2019-02-21 DIAGNOSIS — Z9689 Presence of other specified functional implants: Secondary | ICD-10-CM

## 2019-02-21 DIAGNOSIS — E871 Hypo-osmolality and hyponatremia: Secondary | ICD-10-CM | POA: Diagnosis present

## 2019-02-21 DIAGNOSIS — Z0189 Encounter for other specified special examinations: Secondary | ICD-10-CM

## 2019-02-21 DIAGNOSIS — K6812 Psoas muscle abscess: Secondary | ICD-10-CM

## 2019-02-21 DIAGNOSIS — Z8619 Personal history of other infectious and parasitic diseases: Secondary | ICD-10-CM

## 2019-02-21 DIAGNOSIS — J9601 Acute respiratory failure with hypoxia: Secondary | ICD-10-CM | POA: Diagnosis not present

## 2019-02-21 DIAGNOSIS — G629 Polyneuropathy, unspecified: Secondary | ICD-10-CM | POA: Diagnosis not present

## 2019-02-21 DIAGNOSIS — J9809 Other diseases of bronchus, not elsewhere classified: Secondary | ICD-10-CM

## 2019-02-21 DIAGNOSIS — R7881 Bacteremia: Secondary | ICD-10-CM

## 2019-02-21 DIAGNOSIS — E870 Hyperosmolality and hypernatremia: Secondary | ICD-10-CM | POA: Diagnosis not present

## 2019-02-21 DIAGNOSIS — E43 Unspecified severe protein-calorie malnutrition: Secondary | ICD-10-CM | POA: Insufficient documentation

## 2019-02-21 DIAGNOSIS — I330 Acute and subacute infective endocarditis: Secondary | ICD-10-CM | POA: Diagnosis present

## 2019-02-21 DIAGNOSIS — R188 Other ascites: Secondary | ICD-10-CM | POA: Diagnosis present

## 2019-02-21 DIAGNOSIS — R7401 Elevation of levels of liver transaminase levels: Secondary | ICD-10-CM | POA: Diagnosis present

## 2019-02-21 DIAGNOSIS — E860 Dehydration: Secondary | ICD-10-CM | POA: Diagnosis present

## 2019-02-21 DIAGNOSIS — T17500A Unspecified foreign body in bronchus causing asphyxiation, initial encounter: Secondary | ICD-10-CM

## 2019-02-21 DIAGNOSIS — E872 Acidosis: Secondary | ICD-10-CM | POA: Diagnosis present

## 2019-02-21 DIAGNOSIS — J948 Other specified pleural conditions: Secondary | ICD-10-CM | POA: Diagnosis present

## 2019-02-21 DIAGNOSIS — Z9289 Personal history of other medical treatment: Secondary | ICD-10-CM

## 2019-02-21 DIAGNOSIS — J851 Abscess of lung with pneumonia: Secondary | ICD-10-CM | POA: Diagnosis not present

## 2019-02-21 DIAGNOSIS — R6521 Severe sepsis with septic shock: Secondary | ICD-10-CM | POA: Diagnosis present

## 2019-02-21 DIAGNOSIS — Z9114 Patient's other noncompliance with medication regimen: Secondary | ICD-10-CM

## 2019-02-21 DIAGNOSIS — J90 Pleural effusion, not elsewhere classified: Secondary | ICD-10-CM

## 2019-02-21 DIAGNOSIS — I38 Endocarditis, valve unspecified: Secondary | ICD-10-CM | POA: Diagnosis present

## 2019-02-21 DIAGNOSIS — M4624 Osteomyelitis of vertebra, thoracic region: Secondary | ICD-10-CM | POA: Diagnosis present

## 2019-02-21 DIAGNOSIS — A419 Sepsis, unspecified organism: Secondary | ICD-10-CM | POA: Diagnosis present

## 2019-02-21 DIAGNOSIS — G8929 Other chronic pain: Secondary | ICD-10-CM | POA: Diagnosis present

## 2019-02-21 DIAGNOSIS — G062 Extradural and subdural abscess, unspecified: Secondary | ICD-10-CM

## 2019-02-21 DIAGNOSIS — J9382 Other air leak: Secondary | ICD-10-CM | POA: Diagnosis not present

## 2019-02-21 DIAGNOSIS — I081 Rheumatic disorders of both mitral and tricuspid valves: Secondary | ICD-10-CM | POA: Diagnosis present

## 2019-02-21 DIAGNOSIS — L89151 Pressure ulcer of sacral region, stage 1: Secondary | ICD-10-CM | POA: Diagnosis present

## 2019-02-21 DIAGNOSIS — Z720 Tobacco use: Secondary | ICD-10-CM

## 2019-02-21 LAB — COMPREHENSIVE METABOLIC PANEL
ALT: 73 U/L — ABNORMAL HIGH (ref 0–44)
AST: 80 U/L — ABNORMAL HIGH (ref 15–41)
Albumin: 1.9 g/dL — ABNORMAL LOW (ref 3.5–5.0)
Alkaline Phosphatase: 158 U/L — ABNORMAL HIGH (ref 38–126)
Anion gap: 15 (ref 5–15)
BUN: 111 mg/dL — ABNORMAL HIGH (ref 6–20)
CO2: 24 mmol/L (ref 22–32)
Calcium: 8.2 mg/dL — ABNORMAL LOW (ref 8.9–10.3)
Chloride: 89 mmol/L — ABNORMAL LOW (ref 98–111)
Creatinine, Ser: 1.77 mg/dL — ABNORMAL HIGH (ref 0.61–1.24)
GFR calc Af Amer: 51 mL/min — ABNORMAL LOW (ref >60)
GFR calc non Af Amer: 44 mL/min — ABNORMAL LOW (ref >60)
Glucose, Bld: 148 mg/dL — ABNORMAL HIGH (ref 70–99)
Potassium: 4.3 mmol/L (ref 3.5–5.1)
Sodium: 128 mmol/L — ABNORMAL LOW (ref 135–145)
Total Bilirubin: 1.7 mg/dL — ABNORMAL HIGH (ref 0.3–1.2)
Total Protein: 6.9 g/dL (ref 6.5–8.1)

## 2019-02-21 LAB — CBC WITH DIFFERENTIAL/PLATELET
Abs Immature Granulocytes: 0.19 10*3/uL — ABNORMAL HIGH (ref 0.00–0.07)
Basophils Absolute: 0.1 10*3/uL (ref 0.0–0.1)
Basophils Relative: 0 %
Eosinophils Absolute: 0 10*3/uL (ref 0.0–0.5)
Eosinophils Relative: 0 %
HCT: 32.1 % — ABNORMAL LOW (ref 39.0–52.0)
Hemoglobin: 10.6 g/dL — ABNORMAL LOW (ref 13.0–17.0)
Immature Granulocytes: 1 %
Lymphocytes Relative: 4 %
Lymphs Abs: 0.8 10*3/uL (ref 0.7–4.0)
MCH: 28.3 pg (ref 26.0–34.0)
MCHC: 33 g/dL (ref 30.0–36.0)
MCV: 85.8 fL (ref 80.0–100.0)
Monocytes Absolute: 0.6 10*3/uL (ref 0.1–1.0)
Monocytes Relative: 3 %
Neutro Abs: 20.6 10*3/uL — ABNORMAL HIGH (ref 1.7–7.7)
Neutrophils Relative %: 92 %
Platelets: 263 10*3/uL (ref 150–400)
RBC: 3.74 MIL/uL — ABNORMAL LOW (ref 4.22–5.81)
RDW: 15.2 % (ref 11.5–15.5)
WBC: 22.4 10*3/uL — ABNORMAL HIGH (ref 4.0–10.5)
nRBC: 0 % (ref 0.0–0.2)

## 2019-02-21 LAB — APTT: aPTT: 24 seconds (ref 24–36)

## 2019-02-21 LAB — PROTIME-INR
INR: 1.4 — ABNORMAL HIGH (ref 0.8–1.2)
Prothrombin Time: 17.2 seconds — ABNORMAL HIGH (ref 11.4–15.2)

## 2019-02-21 LAB — LACTIC ACID, PLASMA: Lactic Acid, Venous: 2.1 mmol/L (ref 0.5–1.9)

## 2019-02-21 IMAGING — CR DG ABDOMEN 1V
1 series · 1 of 1 positions shown · non-contrast
Comparison: None.

CLINICAL DATA: Back pain, progressive neuro deficit, MR VITAL

EXAM:
ABDOMEN - 1 VIEW

[x abdomen supine]
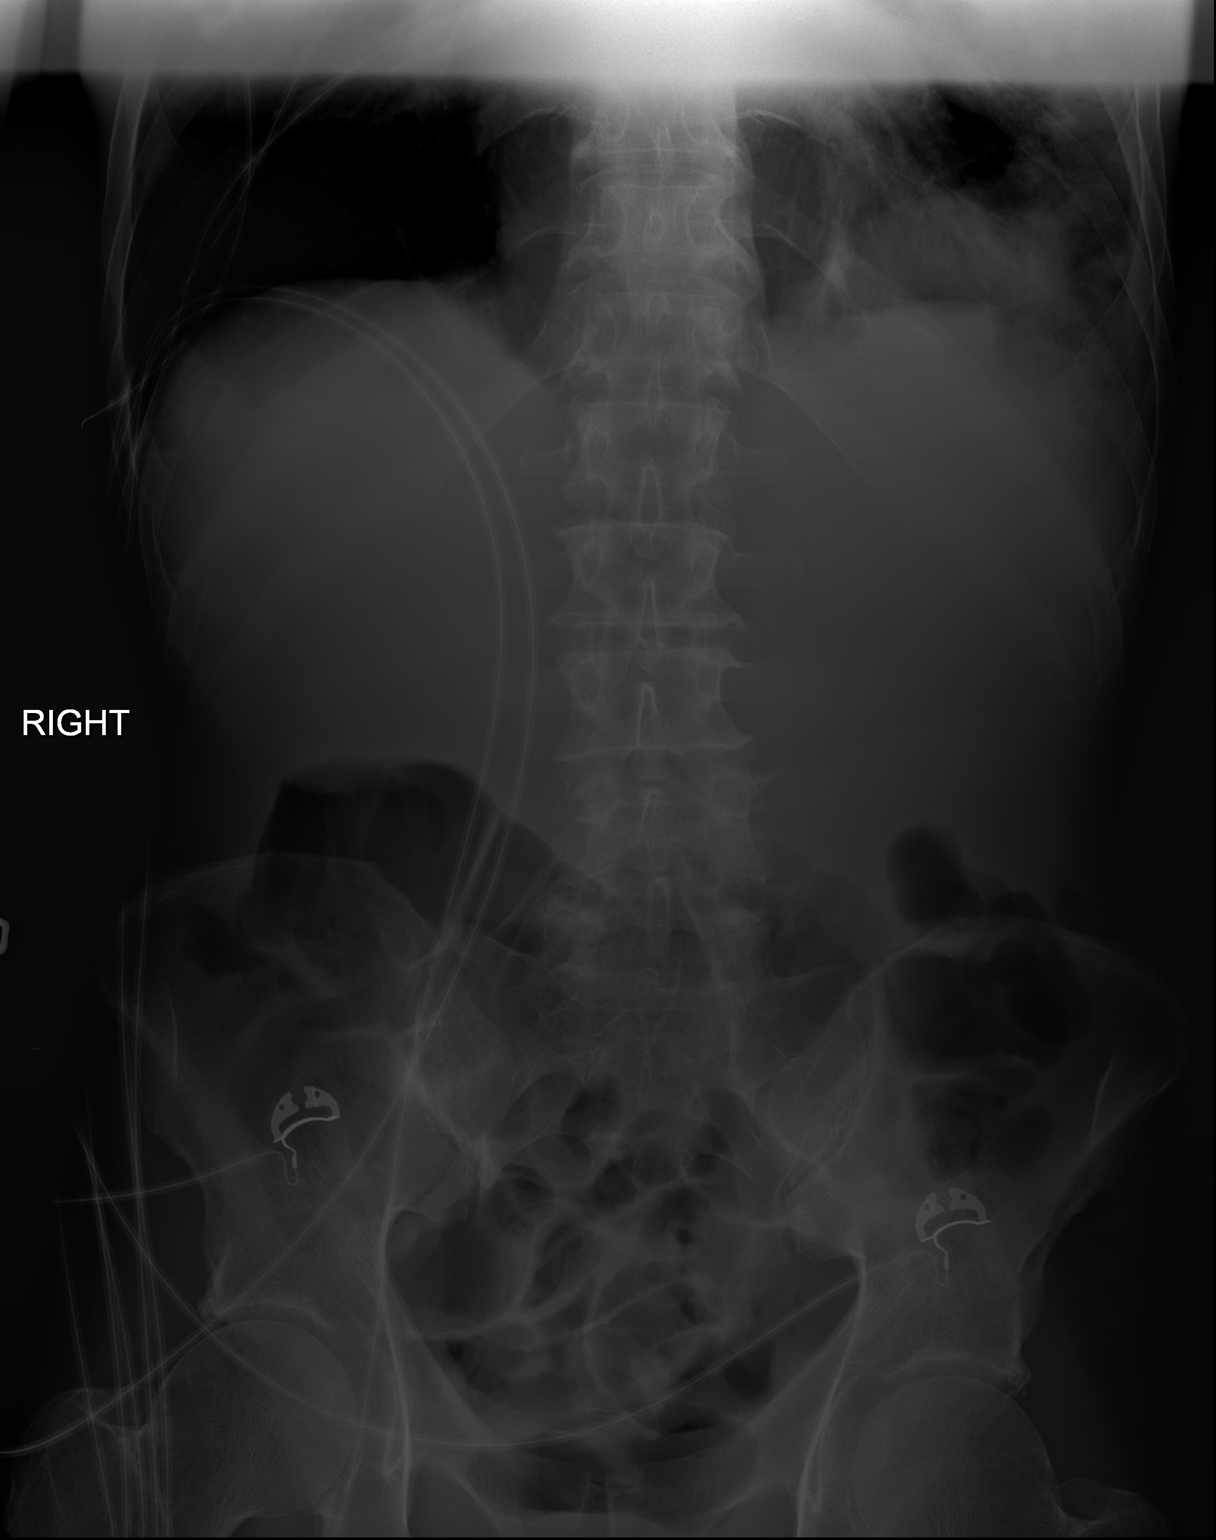

[1 of 1 positions shown; findings below may reference images not displayed]

FINDINGS: Cardiac monitoring leads overlie the pelvis. No surgical hardware or
metallic foreign body is identified. There is a paucity of upper
abdominal bowel gas, nonspecific. Patchy opacities are present in
the left lung base with likely trace left effusion. Degenerative
changes in the spine and hips.
IMPRESSION: 1. No surgical hardware or metallic foreign body is identified.
2. Patchy opacities in the left lung base, suspicious for pneumonia.
Trace left effusion.
3. Nonspecific paucity of upper abdominal bowel gas.

## 2019-02-21 IMAGING — MR MR THORACIC SPINE WO/W CM
4 of 9 series · 20 of 48 positions shown · IV contrast (gadavist)
Comparison: None.

CLINICAL DATA: Back pain with weakness. History of IV drug use.

EXAM:
MRI THORACIC AND LUMBAR SPINE WITHOUT AND WITH CONTRAST
TECHNIQUE: Multiplanar and multiecho pulse sequences of the thoracic and lumbar
spine were obtained without and with intravenous contrast.
CONTRAST:  6mL GADAVIST GADOBUTROL 1 MMOL/ML IV SOLN

[Series 5: T2 post-contrast · sagittal · 4.0mm · 0.62mm/px · 2 of 14 slices shown]
[im 1/14]
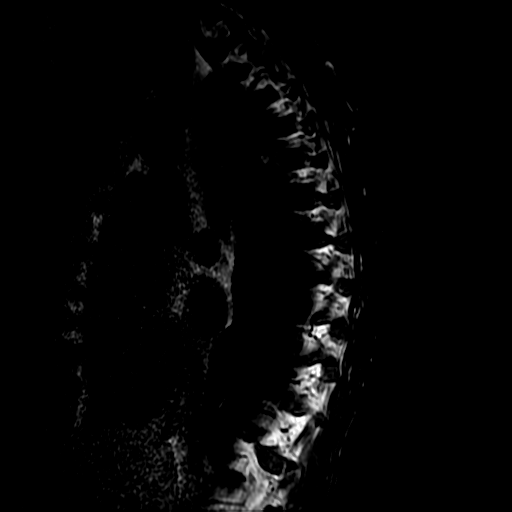
[im 14/14]
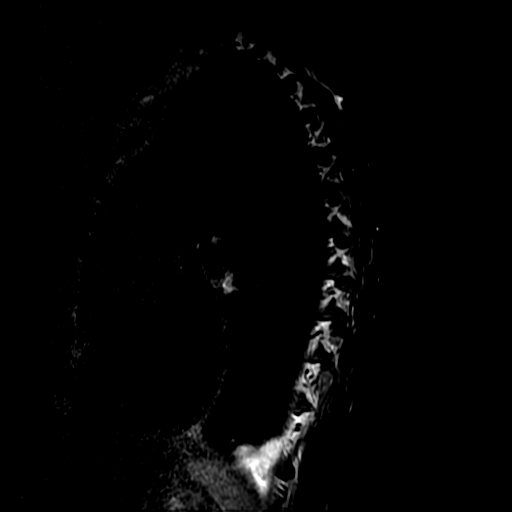

[Series 6: T1 · sagittal · 4.0mm · 1.25mm/px · 3 of 14 slices shown (1 of 2)]
[im 1/14]
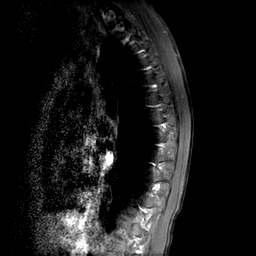
[im 7/14]
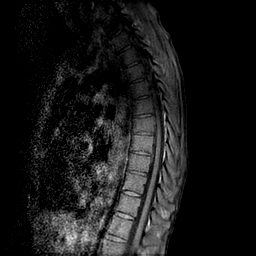
[im 14/14]
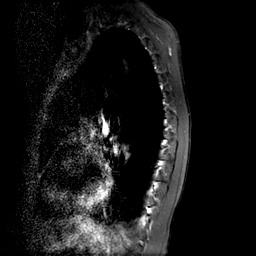

[Series 8: T2 · axial · 5.0mm · 0.39mm/px · z∈[-136,+53]mm · 9 of 44 slices shown]
[im 1/44]
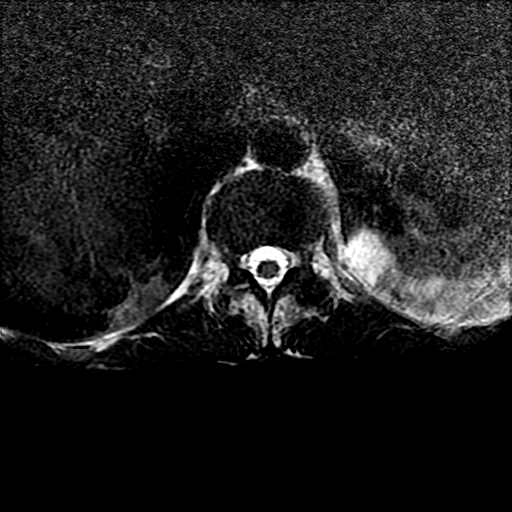
[im 6/44]
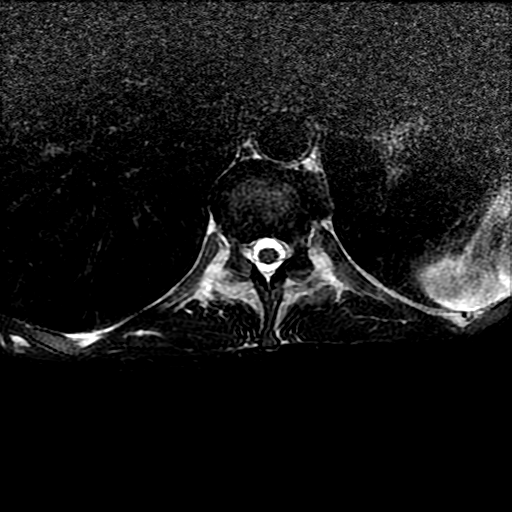
[im 11/44]
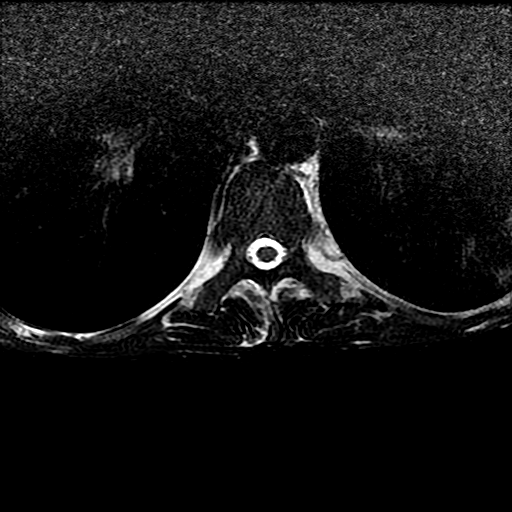
[im 17/44]
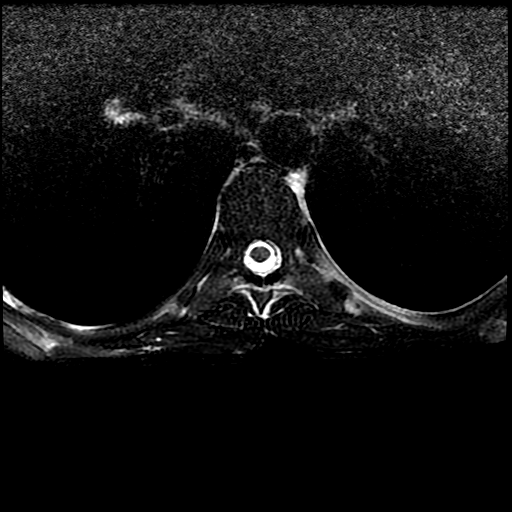
[im 22/44]
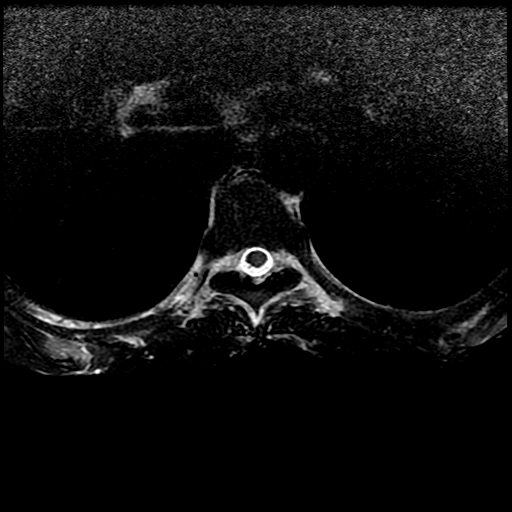
[im 27/44]
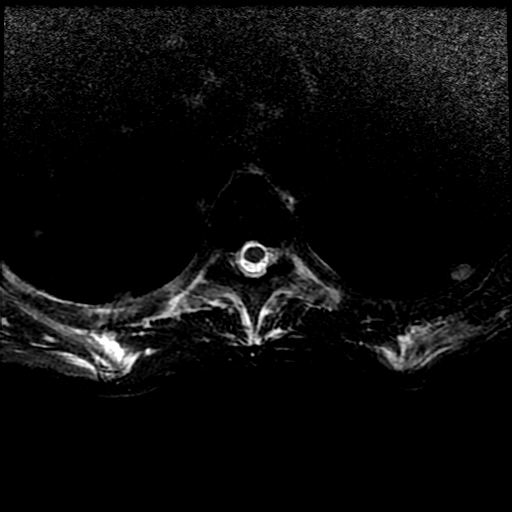
[im 33/44]
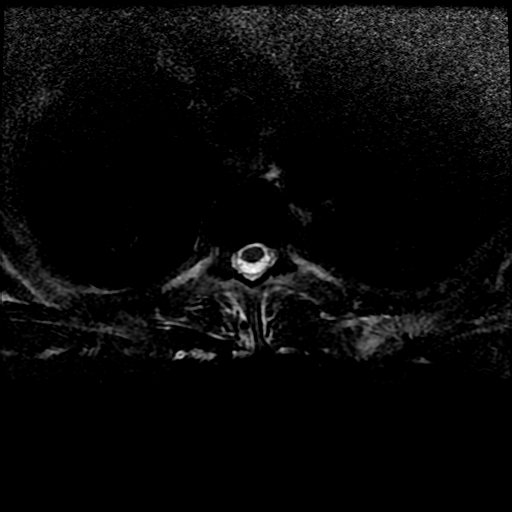
[im 38/44]
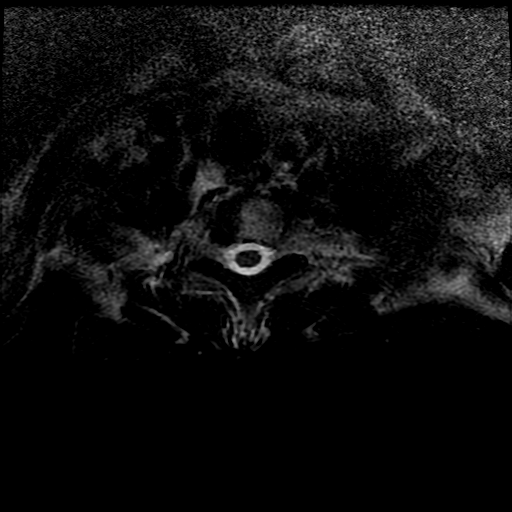
[im 44/44]
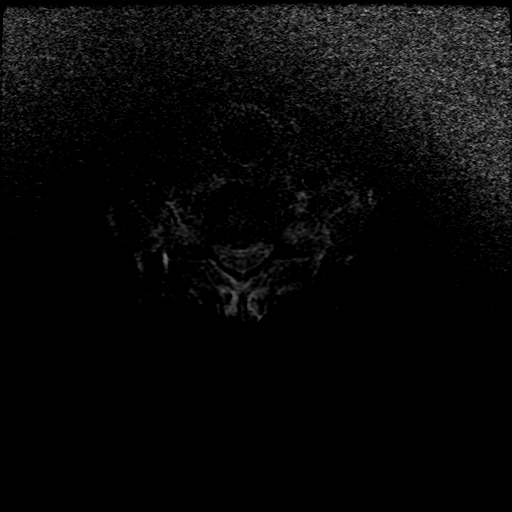

[Series 10: T1 · axial · non-contrast · 5.0mm · 0.39mm/px · z∈[-136,+31]mm · 6 of 44 slices shown (2 of 2)]
[im 1/44]
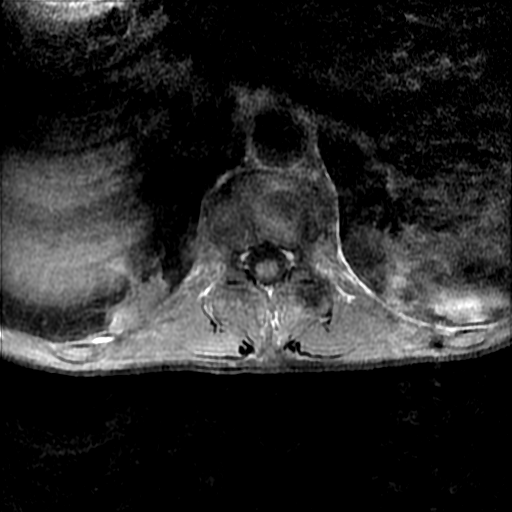
[im 6/44]
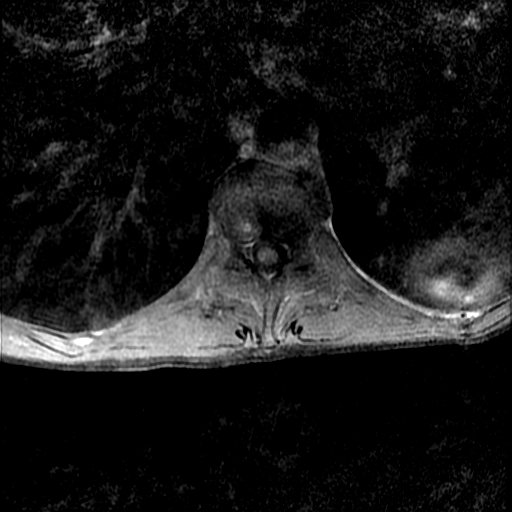
[im 11/44]
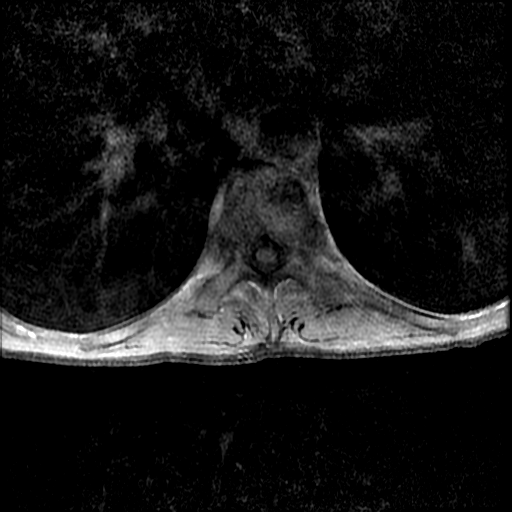
[im 17/44]
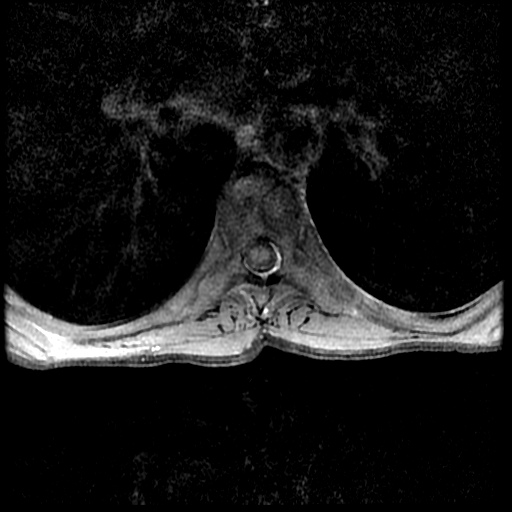
[im 22/44]
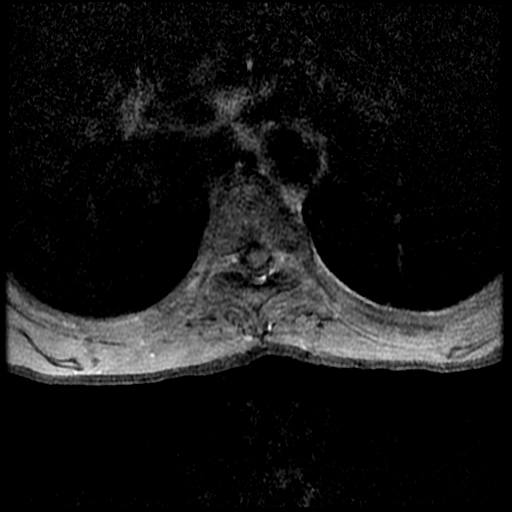
[im 38/44]
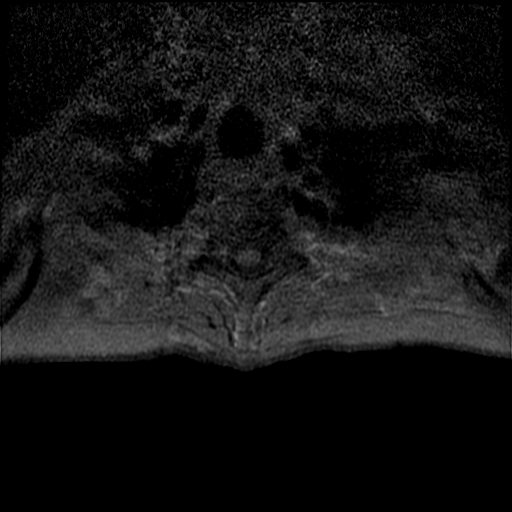

[20 of 48 positions shown; findings below may reference images not displayed]

FINDINGS: MRI THORACIC SPINE FINDINGS

Alignment:  Physiologic.

Vertebrae: Hyperintense T2-weighted signal and contrast enhancement
within the T1 vertebrae.

Cord:  Normal

Paraspinal and other soft tissues: There are multiple cavitary
lesions within the left lung. There is a large cavitary lesion of
the right lung apex. Mild contrast enhancement of the paraspinal
tissues at T1.

Disc levels:

Small central disc protrusion at T8-9 with no spinal canal stenosis.

MRI LUMBAR SPINE FINDINGS

Segmentation:  Standard.

Alignment:  Normal

Vertebrae: There is hyperintense T2-weighted signal throughout the
bone marrow of the L4 and L5 vertebral bodies, extending to the disc
space. There is abnormal contrast enhancement of both vertebra. Bone
marrow signal is otherwise normal throughout the lumbar spine. There
is fluid within both L4-5 facet joints.

Conus medullaris: Extends to the L2 level and appears normal.

Paraspinal and other soft tissues: There are bilateral psoas muscle
collections, measuring up to 2.6 cm on the right and 2.5 cm on the
left.

Disc levels:

L1-2: Normal.

L2-3: Intermediate disc bulge with mild facet hypertrophy. No
stenosis.

L3-4: Left eccentric disc bulge with mild left lateral recess
narrowing. Mild left foraminal stenosis. No spinal canal stenosis.

L4-5: Abnormal enhancement as above. The abnormal enhancement
extends into both neural foramina. There is severe bilateral
foraminal stenosis. No central spinal canal stenosis. There is mild
contrast enhancement in the soft tissues adjacent to the facet
joints.

L5-S1: There is edema of the paraspinal soft tissues. No disc
herniation or stenosis.
IMPRESSION: 1. Abnormal enhancement of the L4 and L5 vertebral bodies and disc
space, consistent with discitis osteomyelitis. There is severe
bilateral foraminal stenosis at the L4-5 level with abnormal
contrast enhancement extending into both neural foramina.
2. Bilateral psoas muscle abscesses, measuring up to 2.6 cm on the
right and 2.5 cm on the left. Paraspinal soft tissue inflammation at
L4-5 with fluid in both L4-5 facet joints, which may be reactive or
indicative of septic arthritis.
3. Abnormal signal and enhancement of the T1 vertebral body with
surrounding paraspinal soft tissue enhancement, possibly indicating
osteomyelitis.
4. Multiple cavitary lesions within both lungs, consistent with
septic emboli.

## 2019-02-21 IMAGING — CR DG SKULL 1-3V
2 series · 2 of 2 positions shown · non-contrast
Comparison: None.

CLINICAL DATA: 47-year-old male with MRI clearance.

EXAM:
SKULL - 1-3 VIEW

[x skull ap]
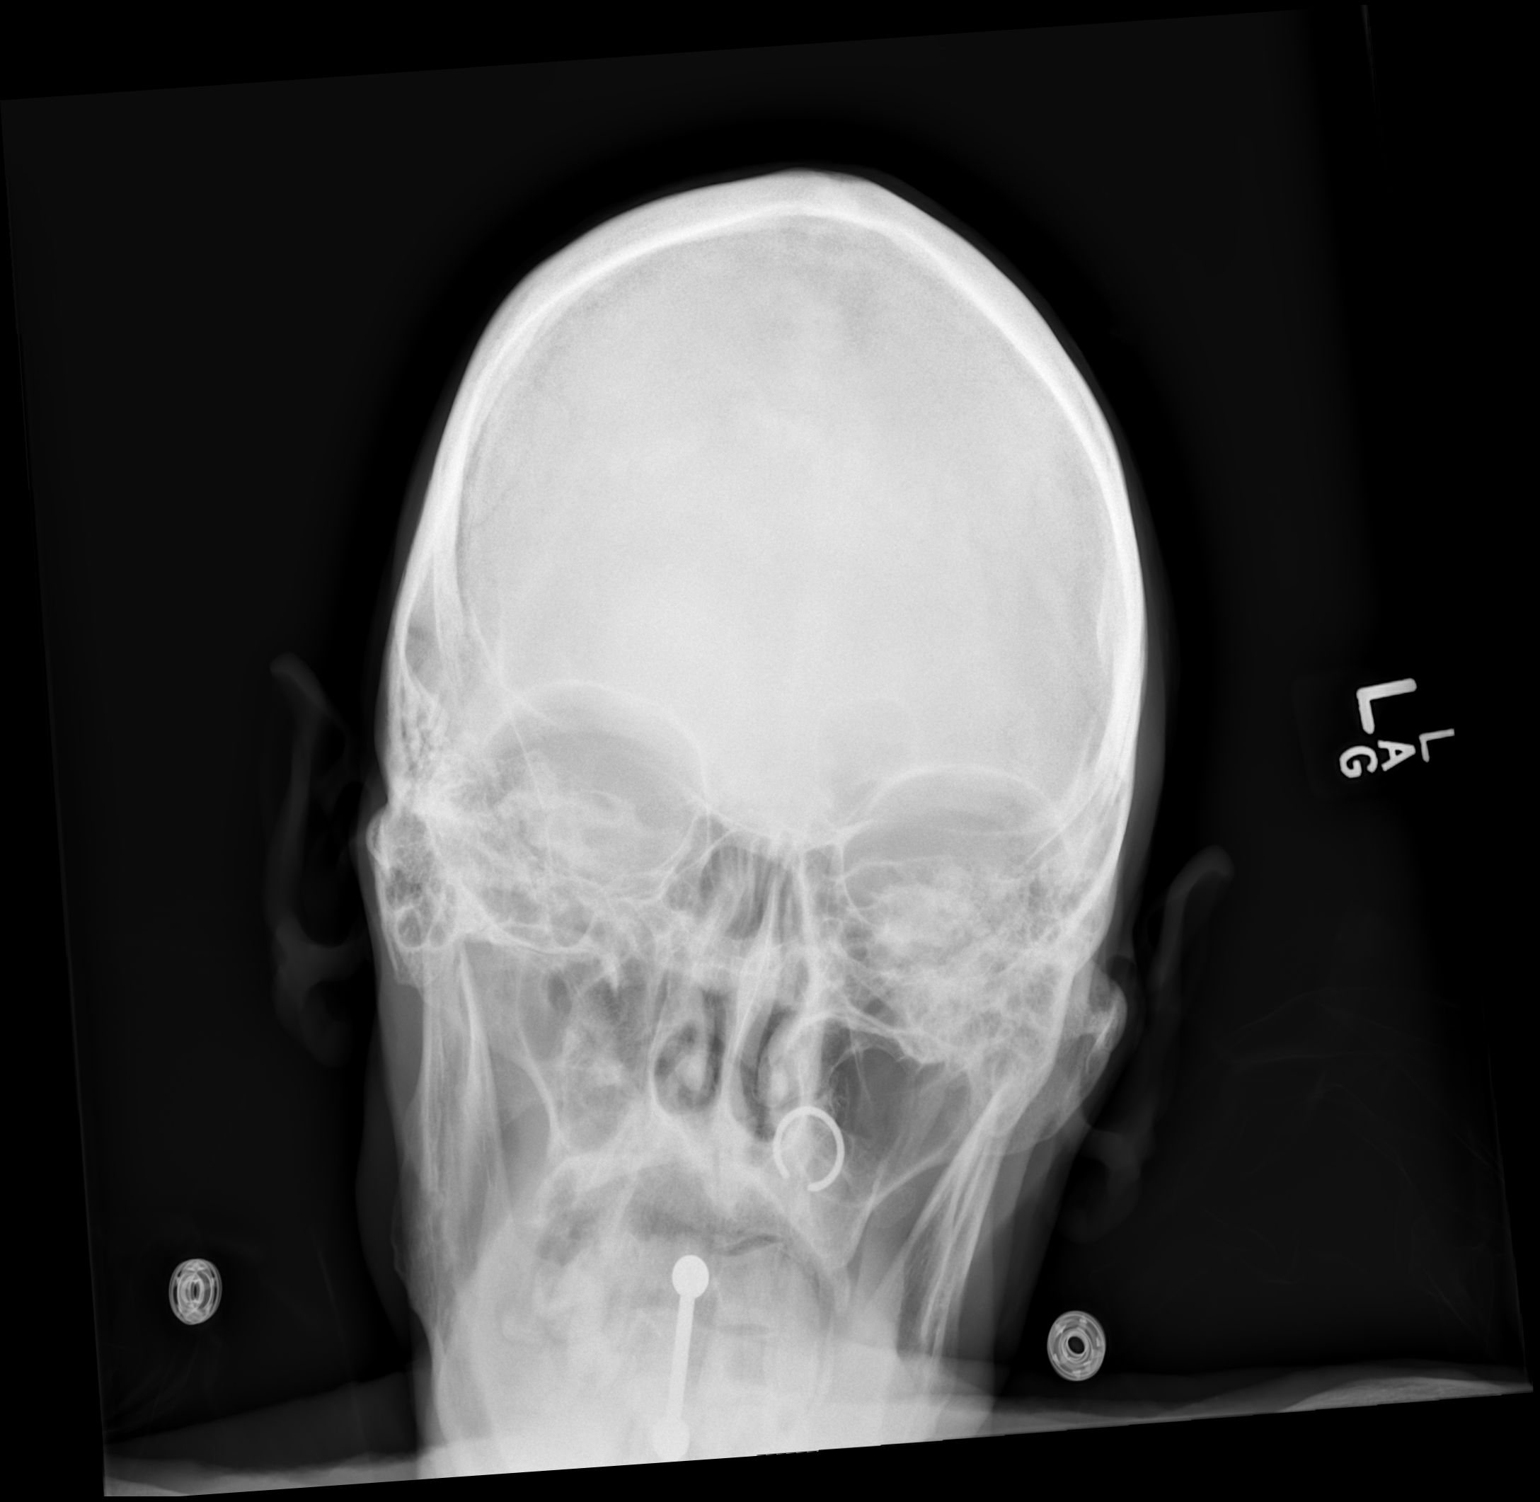

[x skull lat]
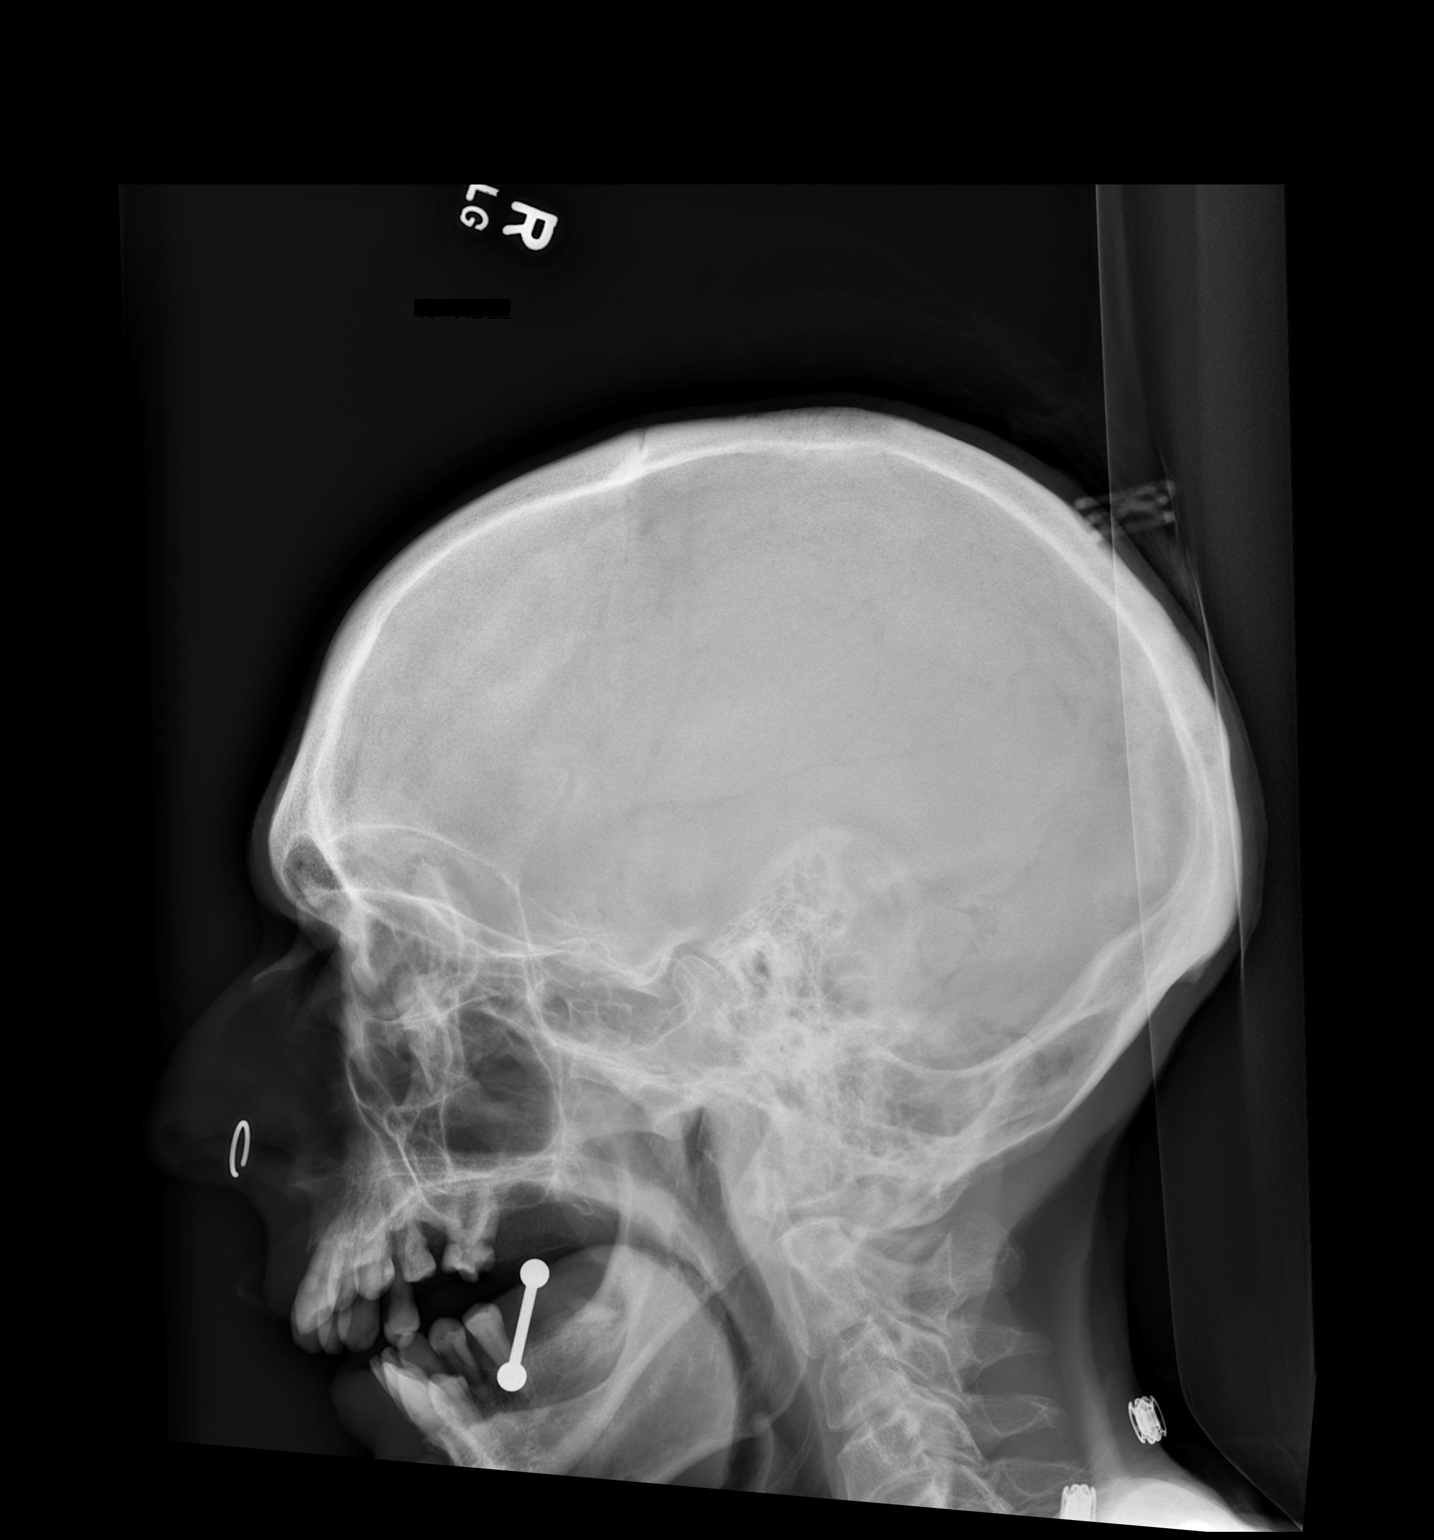

[2 of 2 positions shown; findings below may reference images not displayed]

FINDINGS: There is no evidence of skull fracture or other focal bone lesions.
There is opacification of the right frontal sinus. Nasal and tongue
piercings noted. No other radiopaque foreign object identified.
IMPRESSION: 1. Nose ring and tongue piercing. No other radiopaque/metallic
foreign object.
2. No evidence of skull fracture or other focal bone lesions.
3. Opacification of the right frontal sinus.

.

## 2019-02-21 IMAGING — MR MR LUMBAR SPINE WO/W CM
4 of 7 series · 18 of 48 positions shown · IV contrast (Yes)
Comparison: None.

CLINICAL DATA: Back pain with weakness. History of IV drug use.

EXAM:
MRI THORACIC AND LUMBAR SPINE WITHOUT AND WITH CONTRAST
TECHNIQUE: Multiplanar and multiecho pulse sequences of the thoracic and lumbar
spine were obtained without and with intravenous contrast.
CONTRAST:  6mL GADAVIST GADOBUTROL 1 MMOL/ML IV SOLN

[Series 13: T1 · sagittal · 5.0mm · 0.51mm/px · 3 of 14 slices shown (1 of 2)]
[im 1/14]
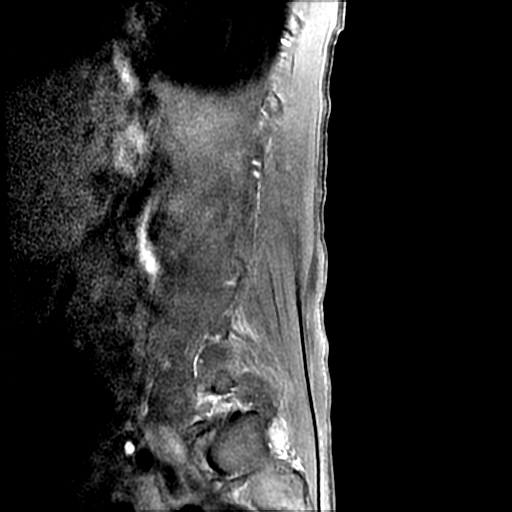
[im 7/14]
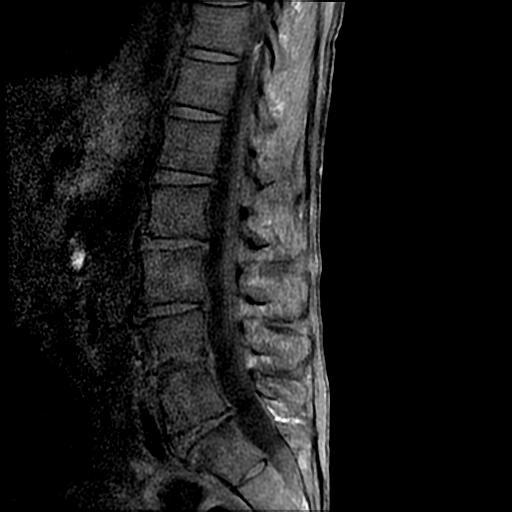
[im 14/14]
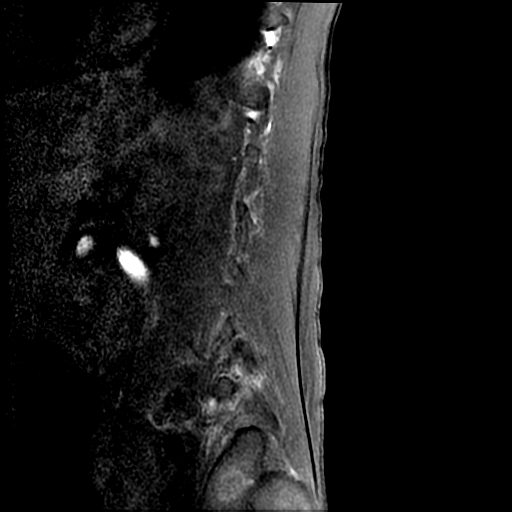

[Series 15: T2 · axial · 5.0mm · 0.39mm/px · z∈[-357,-178]mm · 8 of 33 slices shown]
[im 1/33]
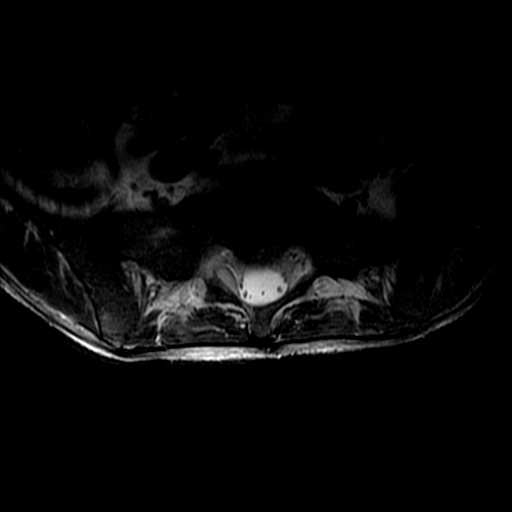
[im 4/33]
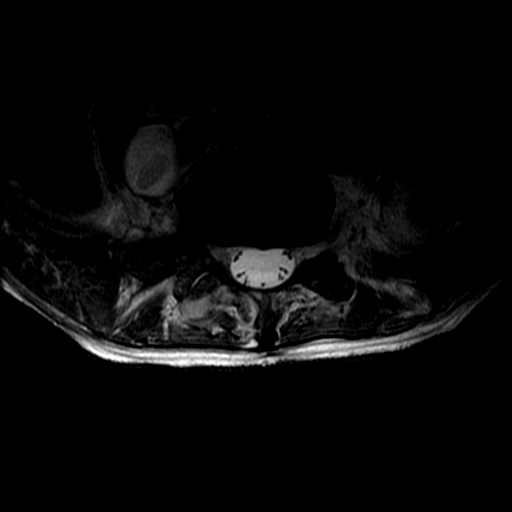
[im 11/33]
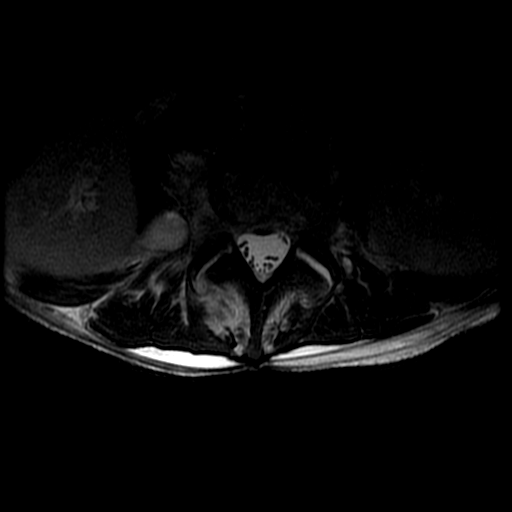
[im 15/33]
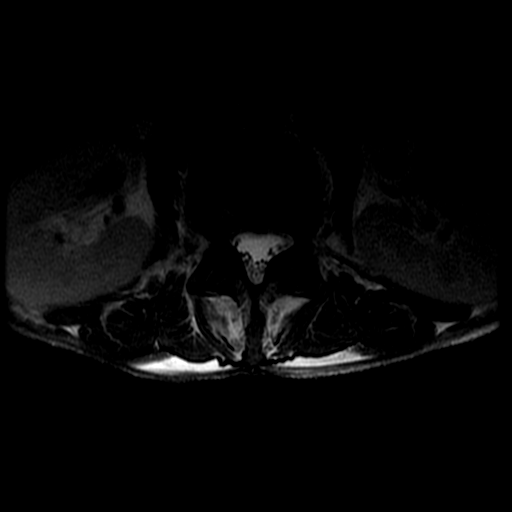
[im 18/33]
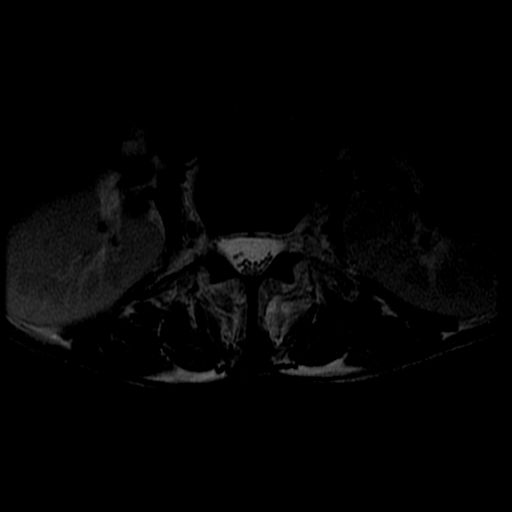
[im 22/33]
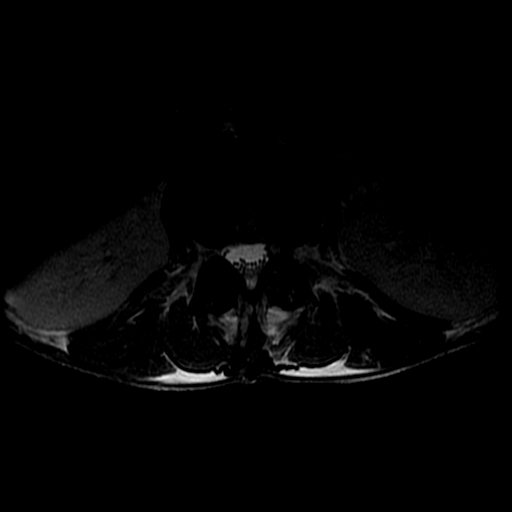
[im 29/33]
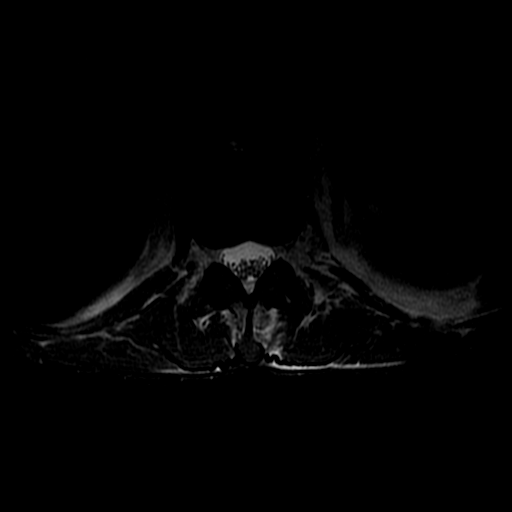
[im 33/33]
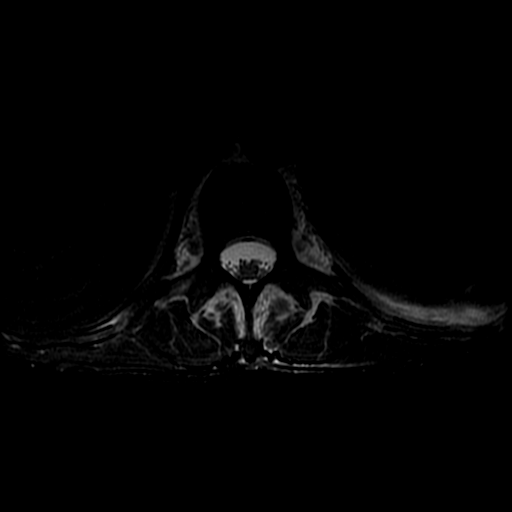

[Series 16: T1 · axial · 5.0mm · 0.39mm/px · z∈[-343,-198]mm · 3 of 33 slices shown (2 of 2)]
[im 4/33]
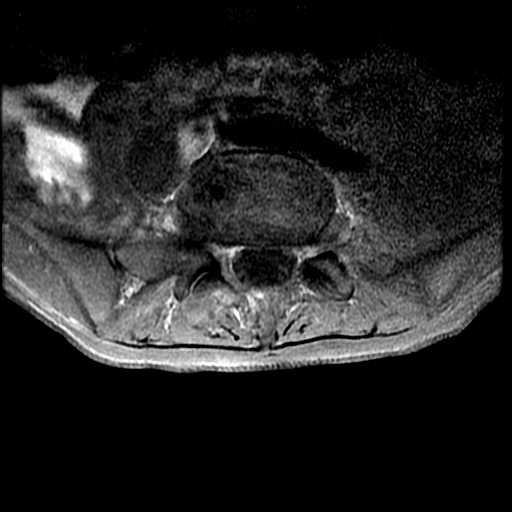
[im 18/33]
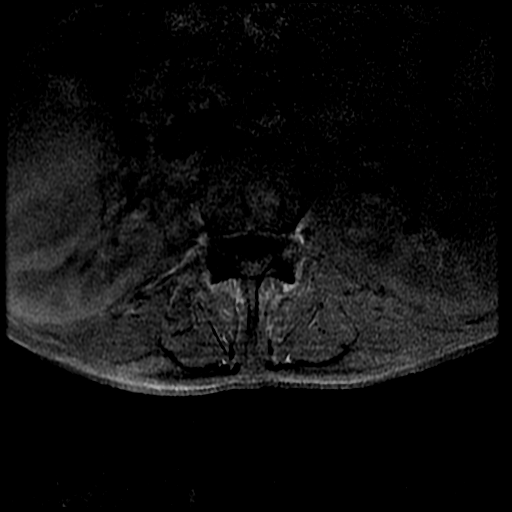
[im 29/33]
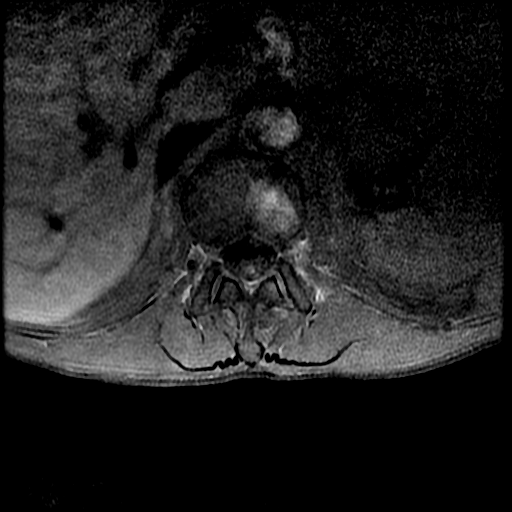

[Series 17: T2 post-contrast · sagittal · 5.0mm · 0.51mm/px · 4 of 14 slices shown]
[im 1/14]
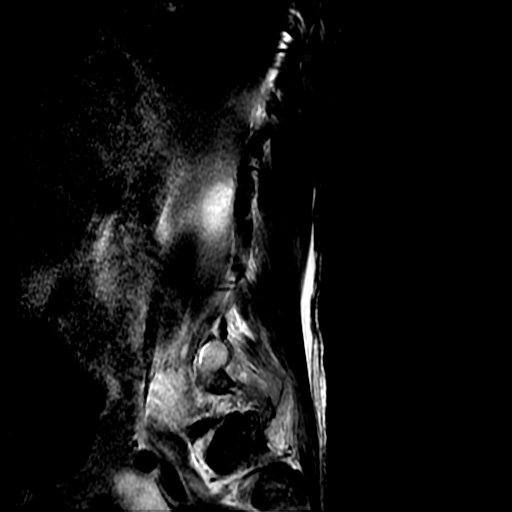
[im 5/14]
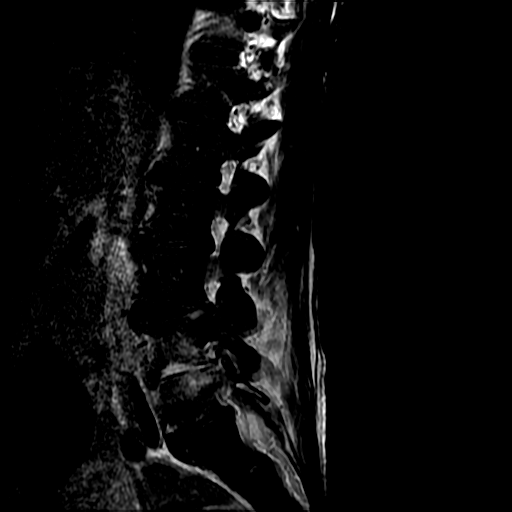
[im 9/14]
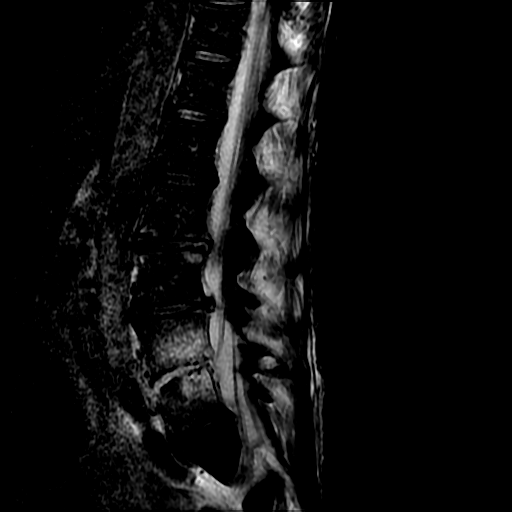
[im 14/14]
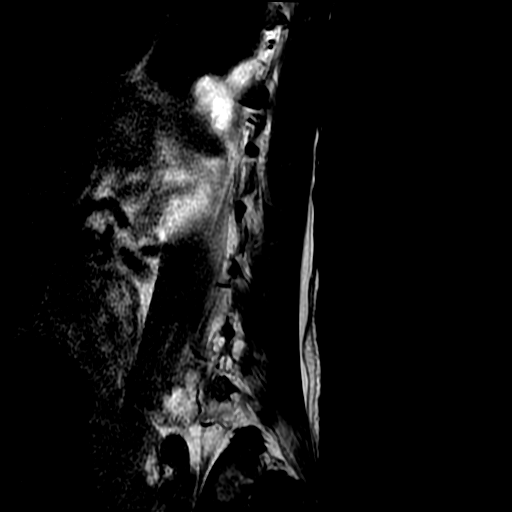

[18 of 48 positions shown; findings below may reference images not displayed]

FINDINGS: MRI THORACIC SPINE FINDINGS

Alignment:  Physiologic.

Vertebrae: Hyperintense T2-weighted signal and contrast enhancement
within the T1 vertebrae.

Cord:  Normal

Paraspinal and other soft tissues: There are multiple cavitary
lesions within the left lung. There is a large cavitary lesion of
the right lung apex. Mild contrast enhancement of the paraspinal
tissues at T1.

Disc levels:

Small central disc protrusion at T8-9 with no spinal canal stenosis.

MRI LUMBAR SPINE FINDINGS

Segmentation:  Standard.

Alignment:  Normal

Vertebrae: There is hyperintense T2-weighted signal throughout the
bone marrow of the L4 and L5 vertebral bodies, extending to the disc
space. There is abnormal contrast enhancement of both vertebra. Bone
marrow signal is otherwise normal throughout the lumbar spine. There
is fluid within both L4-5 facet joints.

Conus medullaris: Extends to the L2 level and appears normal.

Paraspinal and other soft tissues: There are bilateral psoas muscle
collections, measuring up to 2.6 cm on the right and 2.5 cm on the
left.

Disc levels:

L1-2: Normal.

L2-3: Intermediate disc bulge with mild facet hypertrophy. No
stenosis.

L3-4: Left eccentric disc bulge with mild left lateral recess
narrowing. Mild left foraminal stenosis. No spinal canal stenosis.

L4-5: Abnormal enhancement as above. The abnormal enhancement
extends into both neural foramina. There is severe bilateral
foraminal stenosis. No central spinal canal stenosis. There is mild
contrast enhancement in the soft tissues adjacent to the facet
joints.

L5-S1: There is edema of the paraspinal soft tissues. No disc
herniation or stenosis.
IMPRESSION: 1. Abnormal enhancement of the L4 and L5 vertebral bodies and disc
space, consistent with discitis osteomyelitis. There is severe
bilateral foraminal stenosis at the L4-5 level with abnormal
contrast enhancement extending into both neural foramina.
2. Bilateral psoas muscle abscesses, measuring up to 2.6 cm on the
right and 2.5 cm on the left. Paraspinal soft tissue inflammation at
L4-5 with fluid in both L4-5 facet joints, which may be reactive or
indicative of septic arthritis.
3. Abnormal signal and enhancement of the T1 vertebral body with
surrounding paraspinal soft tissue enhancement, possibly indicating
osteomyelitis.
4. Multiple cavitary lesions within both lungs, consistent with
septic emboli.

## 2019-02-21 IMAGING — DX DG CHEST 1V PORT
1 series · 1 of 1 positions shown · non-contrast
Comparison: None.

CLINICAL DATA: Shortness of breath

EXAM:
PORTABLE CHEST 1 VIEW

[chest ap]
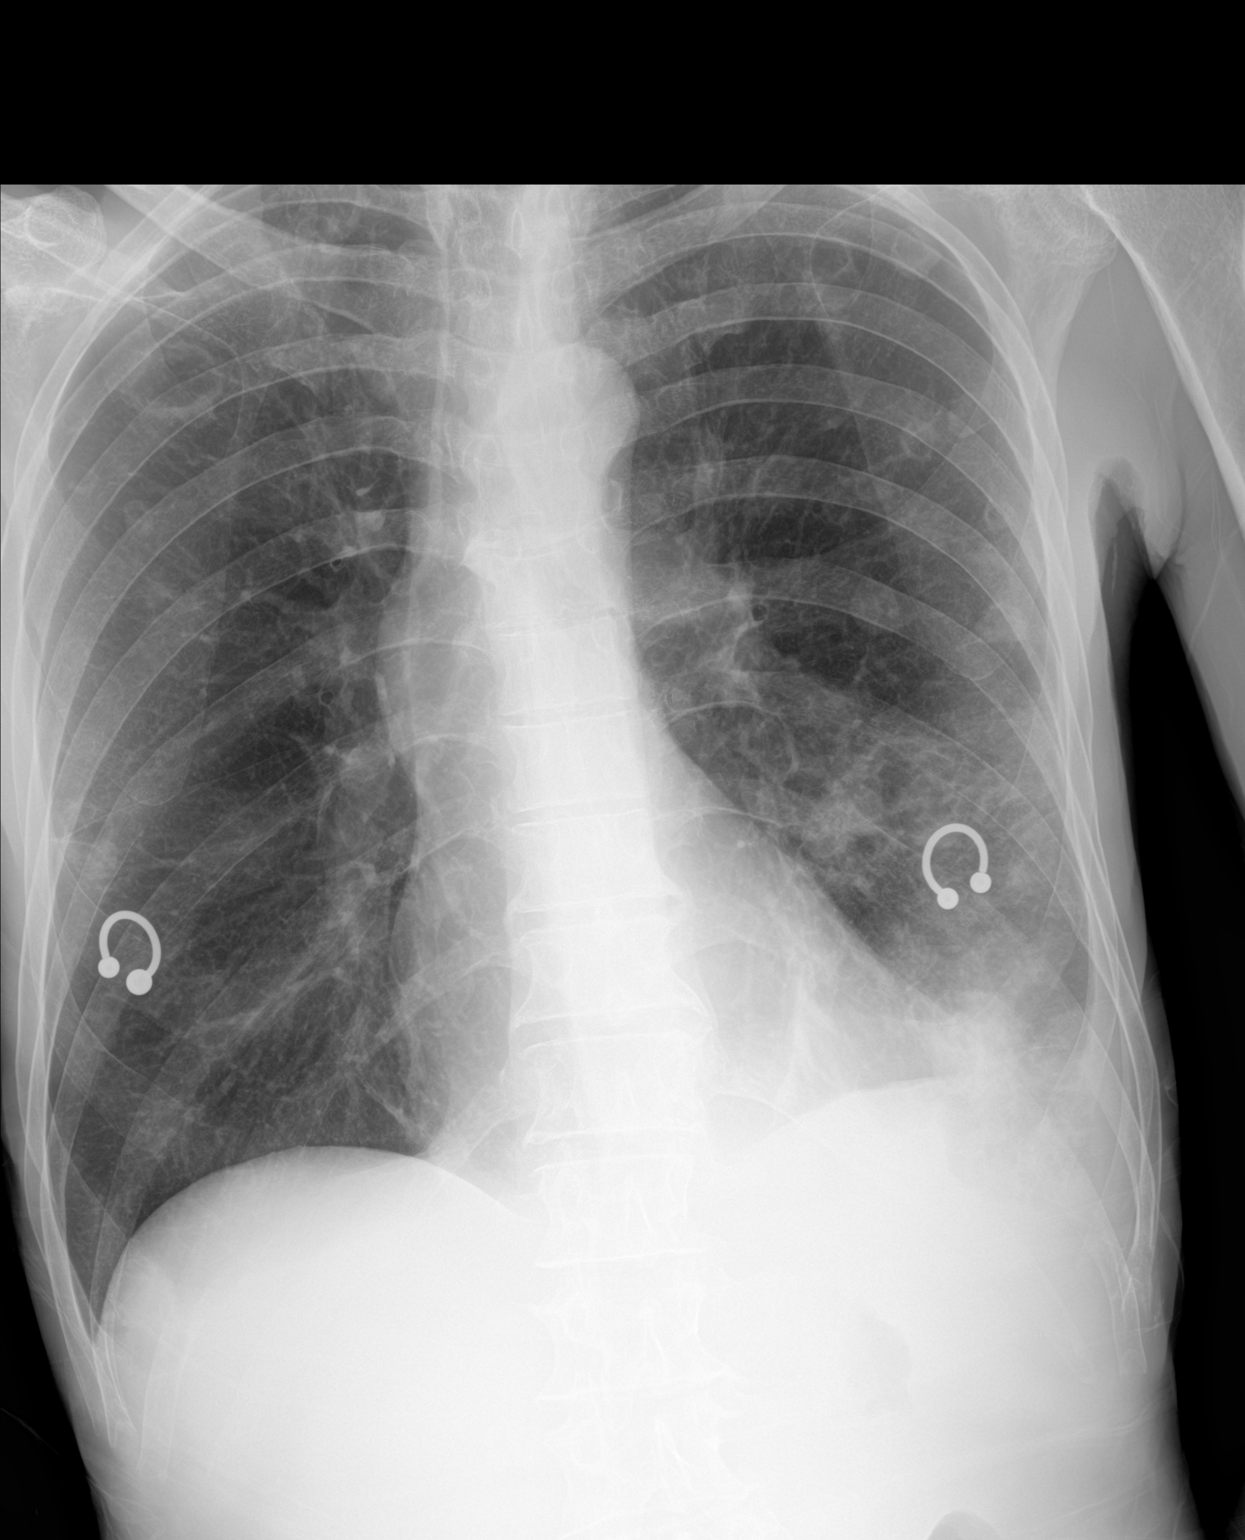

[1 of 1 positions shown; findings below may reference images not displayed]

FINDINGS: There is airspace opacity in the left lower lobe with small left
pleural effusion consistent with pneumonia. Within this area, there
is a nodular appearing area measuring 3.1 x 2.7 cm. There is a
nodular opacity in the periphery of the left mid lung measuring
x 1.1 cm. There is a nodular opacity in the periphery of the right
mid lung measuring 0.9 x 0.9 cm. There is an apparent cavitary
lesion in the right upper lobe measuring 2.9 x 2.1 cm. There are
smaller cavitary appearing lesions throughout the lungs bilaterally.

Heart size and pulmonary vascularity are normal. No adenopathy. No
bone lesions.
IMPRESSION: 1.  Apparent pneumonia left base with small left pleural effusion.

2. Multiple nodular lesions, some of which appear cavitated.
Question metastatic foci versus septic emboli. Advise contrast
enhanced chest CT to further assess.

No adenopathy appreciable by radiography.  Heart size normal.

## 2019-02-21 MED ORDER — SODIUM CHLORIDE 0.9 % IV BOLUS: 2000.0000 mL | Freq: Once | INTRAVENOUS | Status: DC

## 2019-02-21 MED ORDER — VANCOMYCIN HCL IN DEXTROSE 1-5 GM/200ML-% IV SOLN
1000.0000 mg | Freq: Once | INTRAVENOUS | Status: AC
  Administered 2019-02-22: 01:00:00 1000 mg via INTRAVENOUS
  Filled 2019-02-21: qty 200

## 2019-02-21 MED ORDER — METRONIDAZOLE IN NACL 5-0.79 MG/ML-% IV SOLN
500.0000 mg | Freq: Once | INTRAVENOUS | Status: AC
  Administered 2019-02-21: 21:00:00 500 mg via INTRAVENOUS
  Filled 2019-02-21: qty 100

## 2019-02-21 MED ORDER — SODIUM CHLORIDE 0.9 % IV SOLN
INTRAVENOUS | Status: DC
  Administered 2019-02-22: 06:00:00 via INTRAVENOUS

## 2019-02-21 MED ORDER — SODIUM CHLORIDE 0.9 % IV SOLN
2.0000 g | Freq: Once | INTRAVENOUS | Status: AC
  Administered 2019-02-21: 21:00:00 2 g via INTRAVENOUS
  Filled 2019-02-21: qty 2

## 2019-02-21 MED ORDER — LORAZEPAM 2 MG/ML IJ SOLN
1.0000 mg | Freq: Once | INTRAMUSCULAR | Status: AC
  Administered 2019-02-21: 1 mg via INTRAVENOUS
  Filled 2019-02-21: qty 1

## 2019-02-21 MED ORDER — SODIUM CHLORIDE 0.9 % IV SOLN
1000.0000 mL | INTRAVENOUS | Status: DC
  Administered 2019-02-21: 21:00:00 1000 mL via INTRAVENOUS

## 2019-02-21 MED ORDER — GADOBUTROL 1 MMOL/ML IV SOLN
6.0000 mL | Freq: Once | INTRAVENOUS | Status: AC | PRN
  Administered 2019-02-21: 6 mL via INTRAVENOUS

## 2019-02-21 NOTE — Progress Notes (Signed)
A consult was received from an ED physician for vanc/cefepime per pharmacy dosing.  The patient's profile has been reviewed for ht/wt/allergies/indication/available labs.   A one time order has been placed for vanc 1g and cefepime 2g.  Further antibiotics/pharmacy consults should be ordered by admitting physician if indicated.                       Thank you, Kara Mead 02/21/2019  8:23 PM

## 2019-02-21 NOTE — ED Triage Notes (Signed)
Patient here from home with complaints of lower back pain, loss of bowel and bladder control, heroin detox last use 3 hours ago, and weight loss.  Ambulatory with 2 assist.
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
## 2019-02-22 ENCOUNTER — Emergency Department (HOSPITAL_COMMUNITY): Payer: Self-pay

## 2019-02-22 ENCOUNTER — Inpatient Hospital Stay (HOSPITAL_COMMUNITY): Payer: Self-pay

## 2019-02-22 ENCOUNTER — Other Ambulatory Visit: Payer: Self-pay

## 2019-02-22 DIAGNOSIS — A419 Sepsis, unspecified organism: Secondary | ICD-10-CM

## 2019-02-22 DIAGNOSIS — I2690 Septic pulmonary embolism without acute cor pulmonale: Secondary | ICD-10-CM | POA: Diagnosis present

## 2019-02-22 DIAGNOSIS — R6520 Severe sepsis without septic shock: Secondary | ICD-10-CM

## 2019-02-22 DIAGNOSIS — F1110 Opioid abuse, uncomplicated: Secondary | ICD-10-CM

## 2019-02-22 DIAGNOSIS — K6812 Psoas muscle abscess: Secondary | ICD-10-CM

## 2019-02-22 DIAGNOSIS — J9809 Other diseases of bronchus, not elsewhere classified: Secondary | ICD-10-CM

## 2019-02-22 DIAGNOSIS — T17500A Unspecified foreign body in bronchus causing asphyxiation, initial encounter: Secondary | ICD-10-CM

## 2019-02-22 DIAGNOSIS — R7401 Elevation of levels of liver transaminase levels: Secondary | ICD-10-CM | POA: Diagnosis present

## 2019-02-22 DIAGNOSIS — G9340 Encephalopathy, unspecified: Secondary | ICD-10-CM

## 2019-02-22 DIAGNOSIS — L8990 Pressure ulcer of unspecified site, unspecified stage: Secondary | ICD-10-CM | POA: Insufficient documentation

## 2019-02-22 DIAGNOSIS — J9601 Acute respiratory failure with hypoxia: Secondary | ICD-10-CM

## 2019-02-22 DIAGNOSIS — M4626 Osteomyelitis of vertebra, lumbar region: Secondary | ICD-10-CM

## 2019-02-22 DIAGNOSIS — N179 Acute kidney failure, unspecified: Secondary | ICD-10-CM

## 2019-02-22 DIAGNOSIS — Z87891 Personal history of nicotine dependence: Secondary | ICD-10-CM

## 2019-02-22 DIAGNOSIS — I38 Endocarditis, valve unspecified: Secondary | ICD-10-CM | POA: Diagnosis present

## 2019-02-22 DIAGNOSIS — Z862 Personal history of diseases of the blood and blood-forming organs and certain disorders involving the immune mechanism: Secondary | ICD-10-CM

## 2019-02-22 DIAGNOSIS — M4624 Osteomyelitis of vertebra, thoracic region: Secondary | ICD-10-CM

## 2019-02-22 LAB — COMPREHENSIVE METABOLIC PANEL
ALT: 68 U/L — ABNORMAL HIGH (ref 0–44)
ALT: 65 U/L — ABNORMAL HIGH (ref 0–44)
AST: 72 U/L — ABNORMAL HIGH (ref 15–41)
AST: 74 U/L — ABNORMAL HIGH (ref 15–41)
Albumin: 1.7 g/dL — ABNORMAL LOW (ref 3.5–5.0)
Albumin: 1.7 g/dL — ABNORMAL LOW (ref 3.5–5.0)
Alkaline Phosphatase: 137 U/L — ABNORMAL HIGH (ref 38–126)
Alkaline Phosphatase: 121 U/L (ref 38–126)
Anion gap: 14 (ref 5–15)
Anion gap: 9 (ref 5–15)
BUN: 118 mg/dL — ABNORMAL HIGH (ref 6–20)
BUN: 97 mg/dL — ABNORMAL HIGH (ref 6–20)
CO2: 23 mmol/L (ref 22–32)
CO2: 26 mmol/L (ref 22–32)
Calcium: 8.1 mg/dL — ABNORMAL LOW (ref 8.9–10.3)
Calcium: 8 mg/dL — ABNORMAL LOW (ref 8.9–10.3)
Chloride: 94 mmol/L — ABNORMAL LOW (ref 98–111)
Chloride: 99 mmol/L (ref 98–111)
Creatinine, Ser: 1.48 mg/dL — ABNORMAL HIGH (ref 0.61–1.24)
Creatinine, Ser: 1.23 mg/dL (ref 0.61–1.24)
GFR calc Af Amer: >60 mL/min (ref >60)
GFR calc Af Amer: >60 mL/min (ref >60)
GFR calc non Af Amer: 55 mL/min — ABNORMAL LOW (ref >60)
GFR calc non Af Amer: >60 mL/min (ref >60)
Glucose, Bld: 116 mg/dL — ABNORMAL HIGH (ref 70–99)
Glucose, Bld: 128 mg/dL — ABNORMAL HIGH (ref 70–99)
Potassium: 3.7 mmol/L (ref 3.5–5.1)
Potassium: 3.6 mmol/L (ref 3.5–5.1)
Sodium: 131 mmol/L — ABNORMAL LOW (ref 135–145)
Sodium: 134 mmol/L — ABNORMAL LOW (ref 135–145)
Total Bilirubin: 1.8 mg/dL — ABNORMAL HIGH (ref 0.3–1.2)
Total Bilirubin: 1.7 mg/dL — ABNORMAL HIGH (ref 0.3–1.2)
Total Protein: 6.4 g/dL — ABNORMAL LOW (ref 6.5–8.1)
Total Protein: 6.5 g/dL (ref 6.5–8.1)

## 2019-02-22 LAB — CBC
HCT: 31.5 % — ABNORMAL LOW (ref 39.0–52.0)
HCT: 31.4 % — ABNORMAL LOW (ref 39.0–52.0)
Hemoglobin: 10.3 g/dL — ABNORMAL LOW (ref 13.0–17.0)
Hemoglobin: 10.2 g/dL — ABNORMAL LOW (ref 13.0–17.0)
MCH: 28.1 pg (ref 26.0–34.0)
MCH: 28.7 pg (ref 26.0–34.0)
MCHC: 32.7 g/dL (ref 30.0–36.0)
MCHC: 32.5 g/dL (ref 30.0–36.0)
MCV: 85.8 fL (ref 80.0–100.0)
MCV: 88.2 fL (ref 80.0–100.0)
Platelets: 266 10*3/uL (ref 150–400)
Platelets: 256 10*3/uL (ref 150–400)
RBC: 3.67 MIL/uL — ABNORMAL LOW (ref 4.22–5.81)
RBC: 3.56 MIL/uL — ABNORMAL LOW (ref 4.22–5.81)
RDW: 15.3 % (ref 11.5–15.5)
RDW: 15.3 % (ref 11.5–15.5)
WBC: 20.3 10*3/uL — ABNORMAL HIGH (ref 4.0–10.5)
WBC: 22.9 10*3/uL — ABNORMAL HIGH (ref 4.0–10.5)
nRBC: 0 % (ref 0.0–0.2)
nRBC: 0 % (ref 0.0–0.2)

## 2019-02-22 LAB — DIC (DISSEMINATED INTRAVASCULAR COAGULATION)PANEL
D-Dimer, Quant: 8.03 ug/mL-FEU — ABNORMAL HIGH (ref 0.00–0.50)
Fibrinogen: 372 mg/dL (ref 210–475)
INR: 1.7 — ABNORMAL HIGH (ref 0.8–1.2)
Platelets: 267 10*3/uL (ref 150–400)
Platelets: 267 10*3/uL (ref 150–400)
Prothrombin Time: 19.7 seconds — ABNORMAL HIGH (ref 11.4–15.2)
aPTT: 28 seconds (ref 24–36)

## 2019-02-22 LAB — TRIGLYCERIDES: Triglycerides: 63 mg/dL (ref <150)

## 2019-02-22 LAB — HIV ANTIBODY (ROUTINE TESTING W REFLEX): HIV Screen 4th Generation wRfx: NONREACTIVE

## 2019-02-22 LAB — MRSA PCR SCREENING: MRSA by PCR: NEGATIVE

## 2019-02-22 LAB — BLOOD GAS, ARTERIAL
Acid-Base Excess: 3.3 mmol/L — ABNORMAL HIGH (ref 0.0–2.0)
Acid-base deficit: 0.7 mmol/L (ref 0.0–2.0)
Bicarbonate: 24.7 mmol/L (ref 20.0–28.0)
Bicarbonate: 24 mmol/L (ref 20.0–28.0)
Drawn by: 295031
FIO2: 100
FIO2: 100
O2 Saturation: 94.8 %
O2 Saturation: 99.5 %
Patient temperature: 98.6
Patient temperature: 98.6
pCO2 arterial: 28.8 mmHg — ABNORMAL LOW (ref 32.0–48.0)
pCO2 arterial: 42.7 mmHg (ref 32.0–48.0)
pH, Arterial: 7.542 — ABNORMAL HIGH (ref 7.350–7.450)
pH, Arterial: 7.368 (ref 7.350–7.450)
pO2, Arterial: 76 mmHg — ABNORMAL LOW (ref 83.0–108.0)
pO2, Arterial: 302 mmHg — ABNORMAL HIGH (ref 83.0–108.0)

## 2019-02-22 LAB — LACTIC ACID, PLASMA: Lactic Acid, Venous: 1.1 mmol/L (ref 0.5–1.9)

## 2019-02-22 LAB — ECHOCARDIOGRAM COMPLETE
Height: 72 in
Weight: 2240 oz

## 2019-02-22 LAB — MAGNESIUM: Magnesium: 2.8 mg/dL — ABNORMAL HIGH (ref 1.7–2.4)

## 2019-02-22 LAB — SARS CORONAVIRUS 2 BY RT PCR (HOSPITAL ORDER, PERFORMED IN ~~LOC~~ HOSPITAL LAB): SARS Coronavirus 2: NEGATIVE

## 2019-02-22 LAB — TYPE AND SCREEN
ABO/RH(D): A POS
Antibody Screen: NEGATIVE

## 2019-02-22 LAB — GLUCOSE, CAPILLARY
Glucose-Capillary: 104 mg/dL — ABNORMAL HIGH (ref 70–99)
Glucose-Capillary: 136 mg/dL — ABNORMAL HIGH (ref 70–99)
Glucose-Capillary: 138 mg/dL — ABNORMAL HIGH (ref 70–99)

## 2019-02-22 LAB — PHOSPHORUS: Phosphorus: 5.8 mg/dL — ABNORMAL HIGH (ref 2.5–4.6)

## 2019-02-22 IMAGING — US US RENAL
1 series · 14 of 25 positions shown · non-contrast
Comparison: None.

CLINICAL DATA: Acute renal injury

EXAM:
RENAL / URINARY TRACT ULTRASOUND COMPLETE

[Series 1: us renal · 14 of 71 slices shown]
[im 1/71]
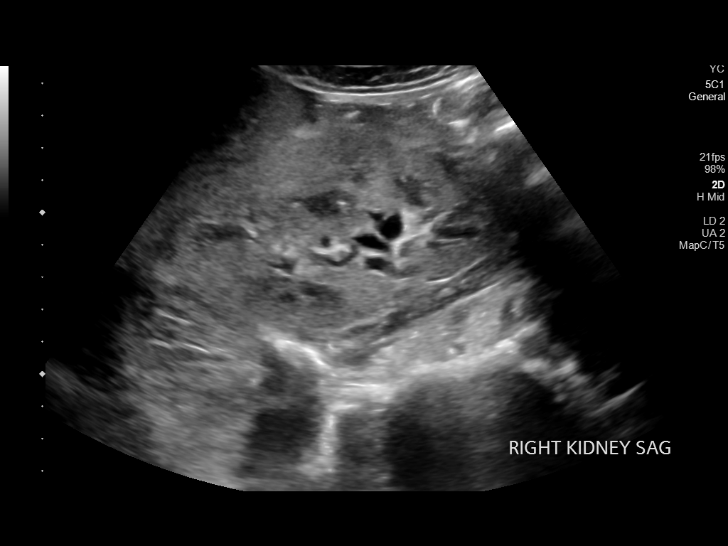
[im 6/71]
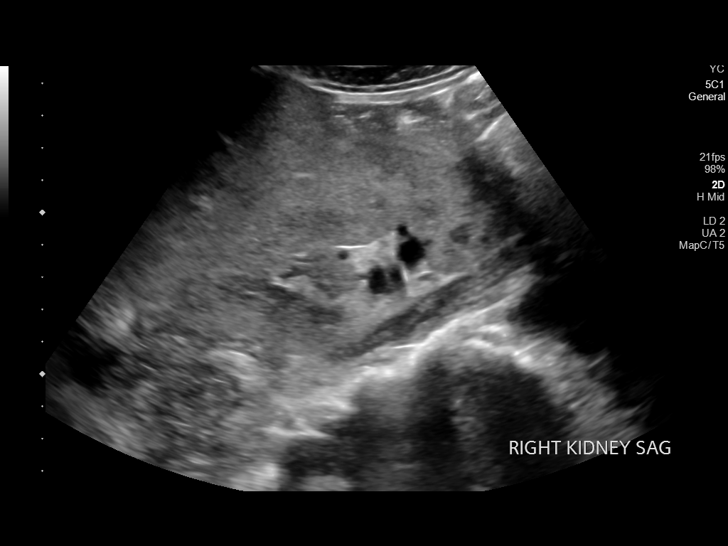
[im 12/71]
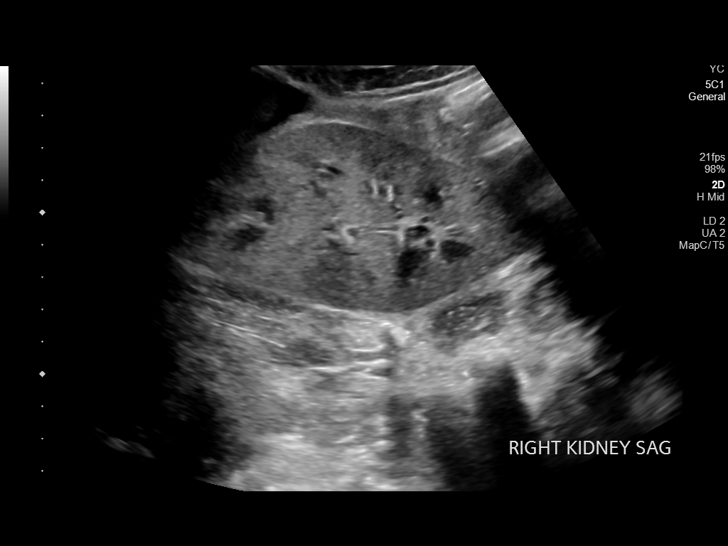
[im 18/71]
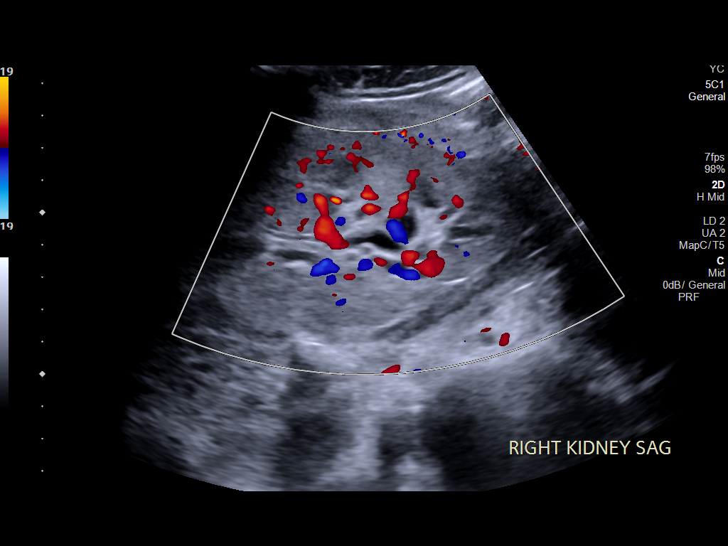
[im 24/71]
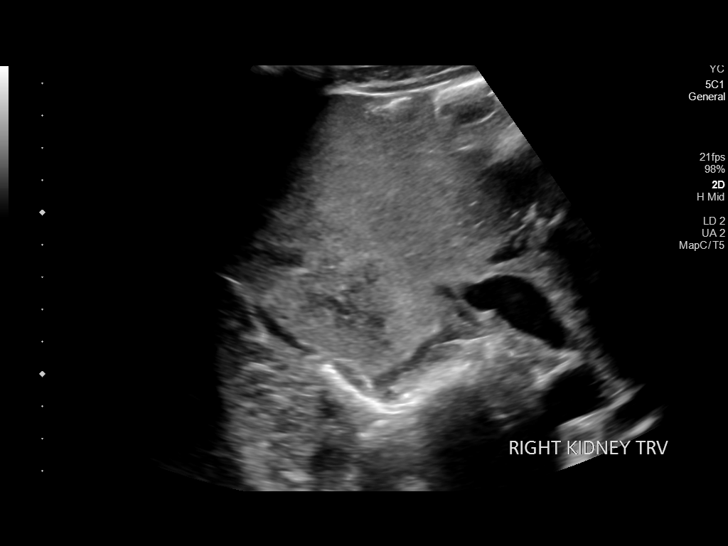
[im 27/71]
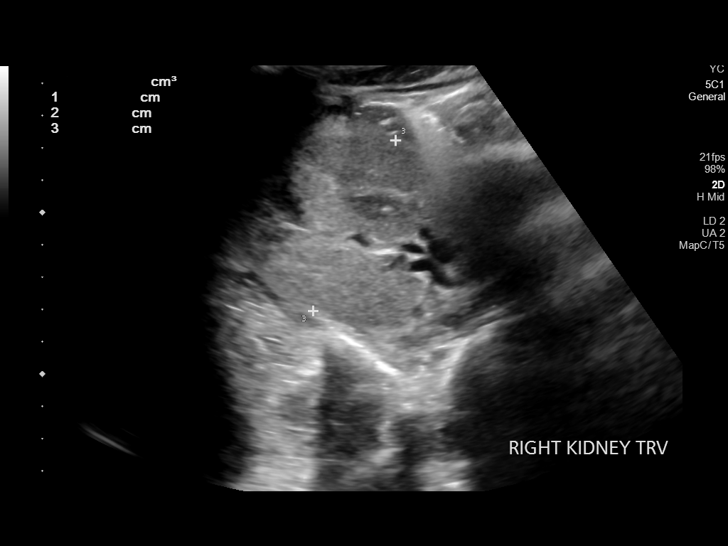
[im 33/71]
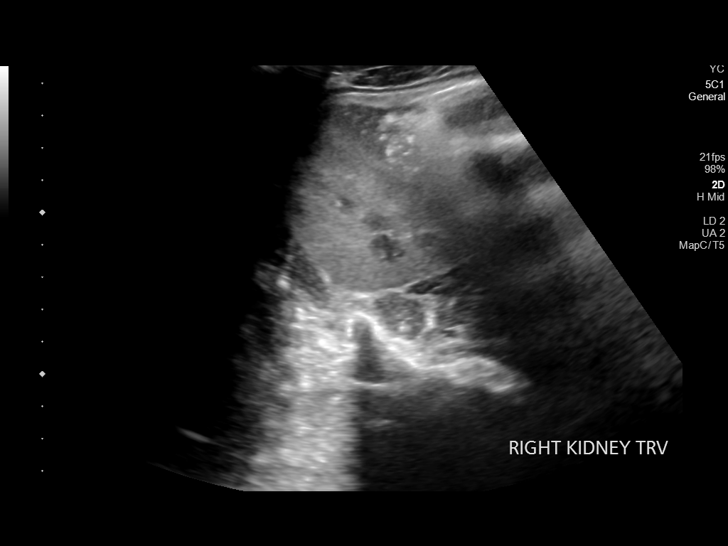
[im 38/71]
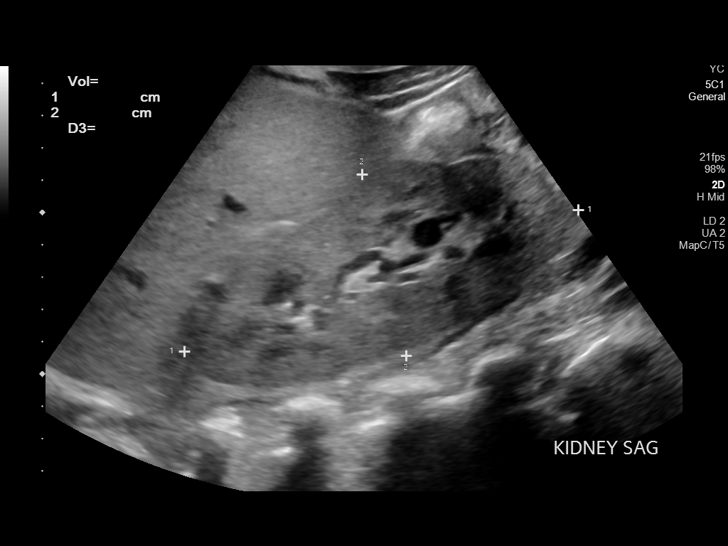
[im 44/71]
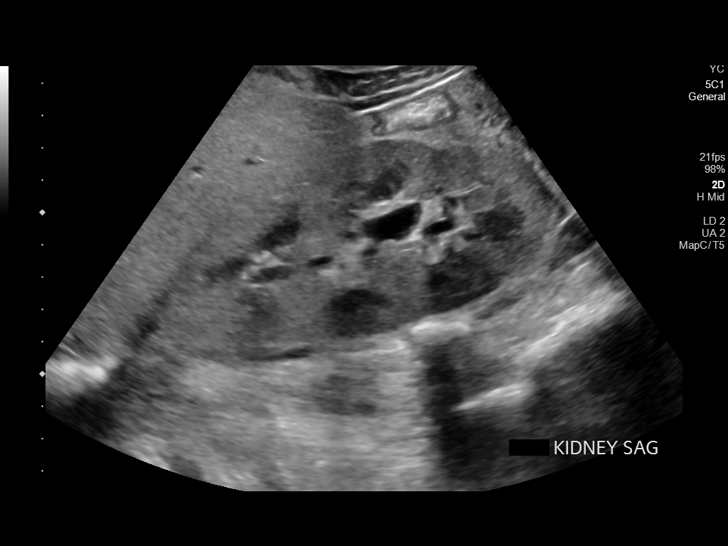
[im 47/71]
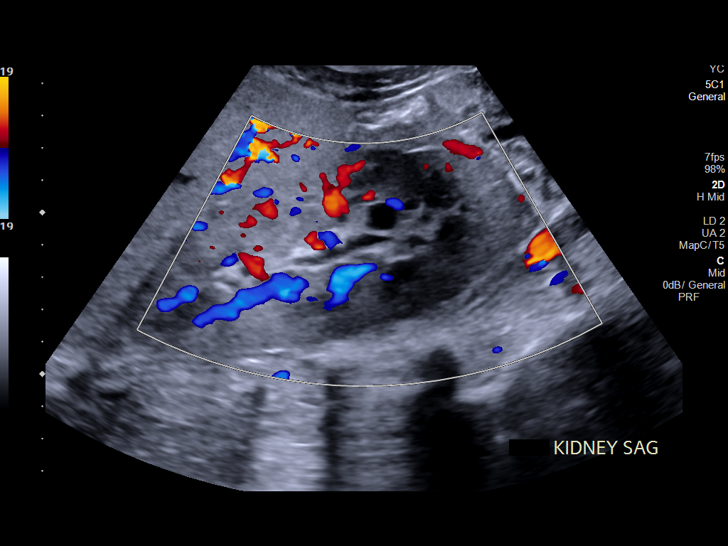
[im 53/71]
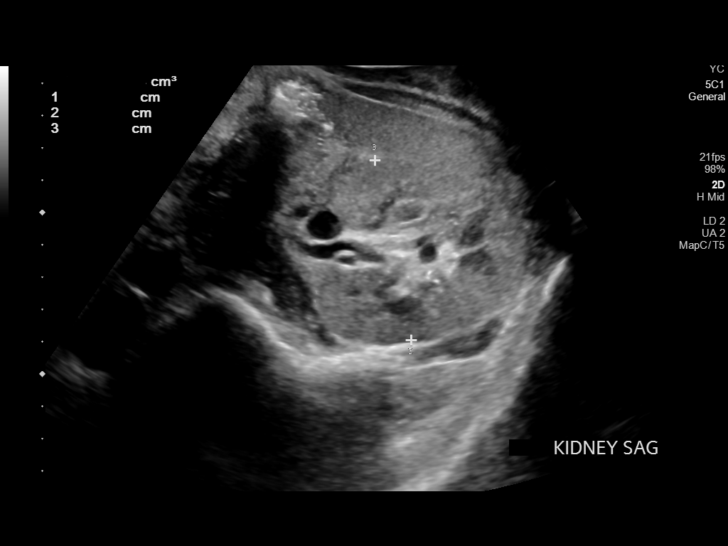
[im 59/71]
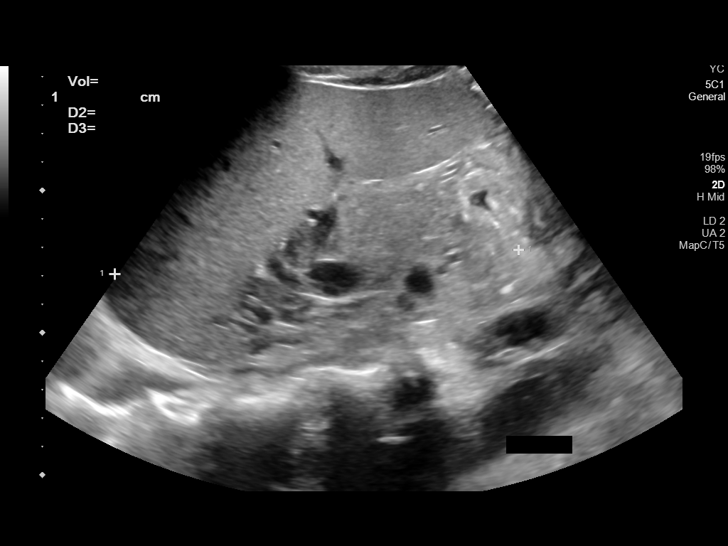
[im 65/71]
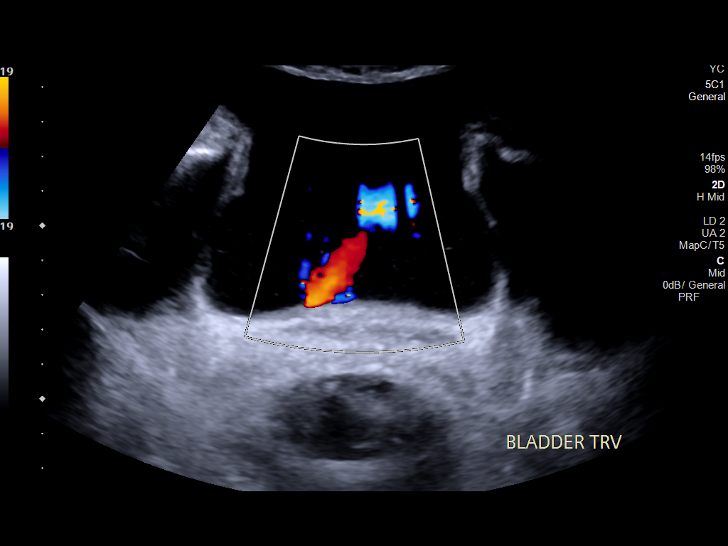
[im 71/71]
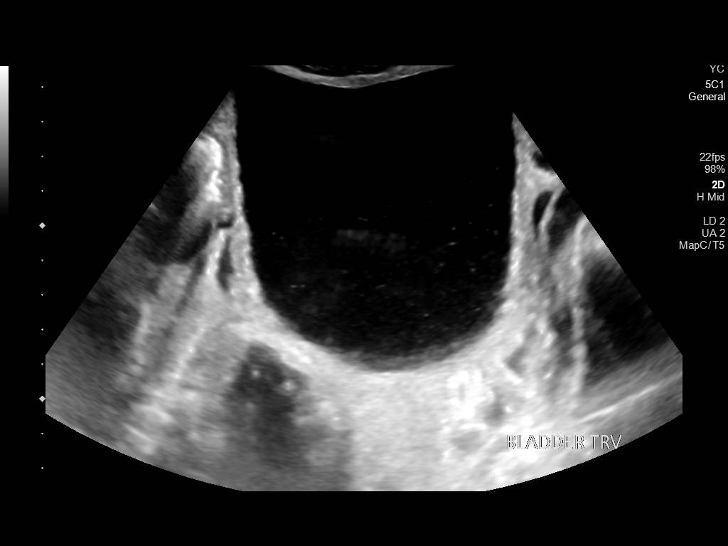

[14 of 25 positions shown; findings below may reference images not displayed]

FINDINGS: Right Kidney:

Renal measurements: 11.2 x 6.2 x 5.9 cm. = volume: 213 mL. Mild
hydronephrosis is noted. Mild increased echogenicity is seen.

Left Kidney:

Renal measurements: 13 x 5.8 x 5.7 cm = volume: 223 mL. Increased
echogenicity is noted. Mild hydronephrosis is noted.

Bladder:

Bladder is well distended. Debris is noted within. Correlate with
any possible UTI.

Other:

None
IMPRESSION: Mild hydronephrosis is noted. This may be related to the distended
bladder.

Mild debris within the bladder which may be related to an underlying
UTI. Clinical correlation is recommended.

Increased echogenicity is noted consistent with medical renal
disease.

## 2019-02-22 IMAGING — DX DG CHEST 1V
1 series · 1 of 1 positions shown · non-contrast
Comparison: [DATE],, [DATE], [DATE]

CLINICAL DATA: Status post central line placement

EXAM:
CHEST  1 VIEW

[chest ap]
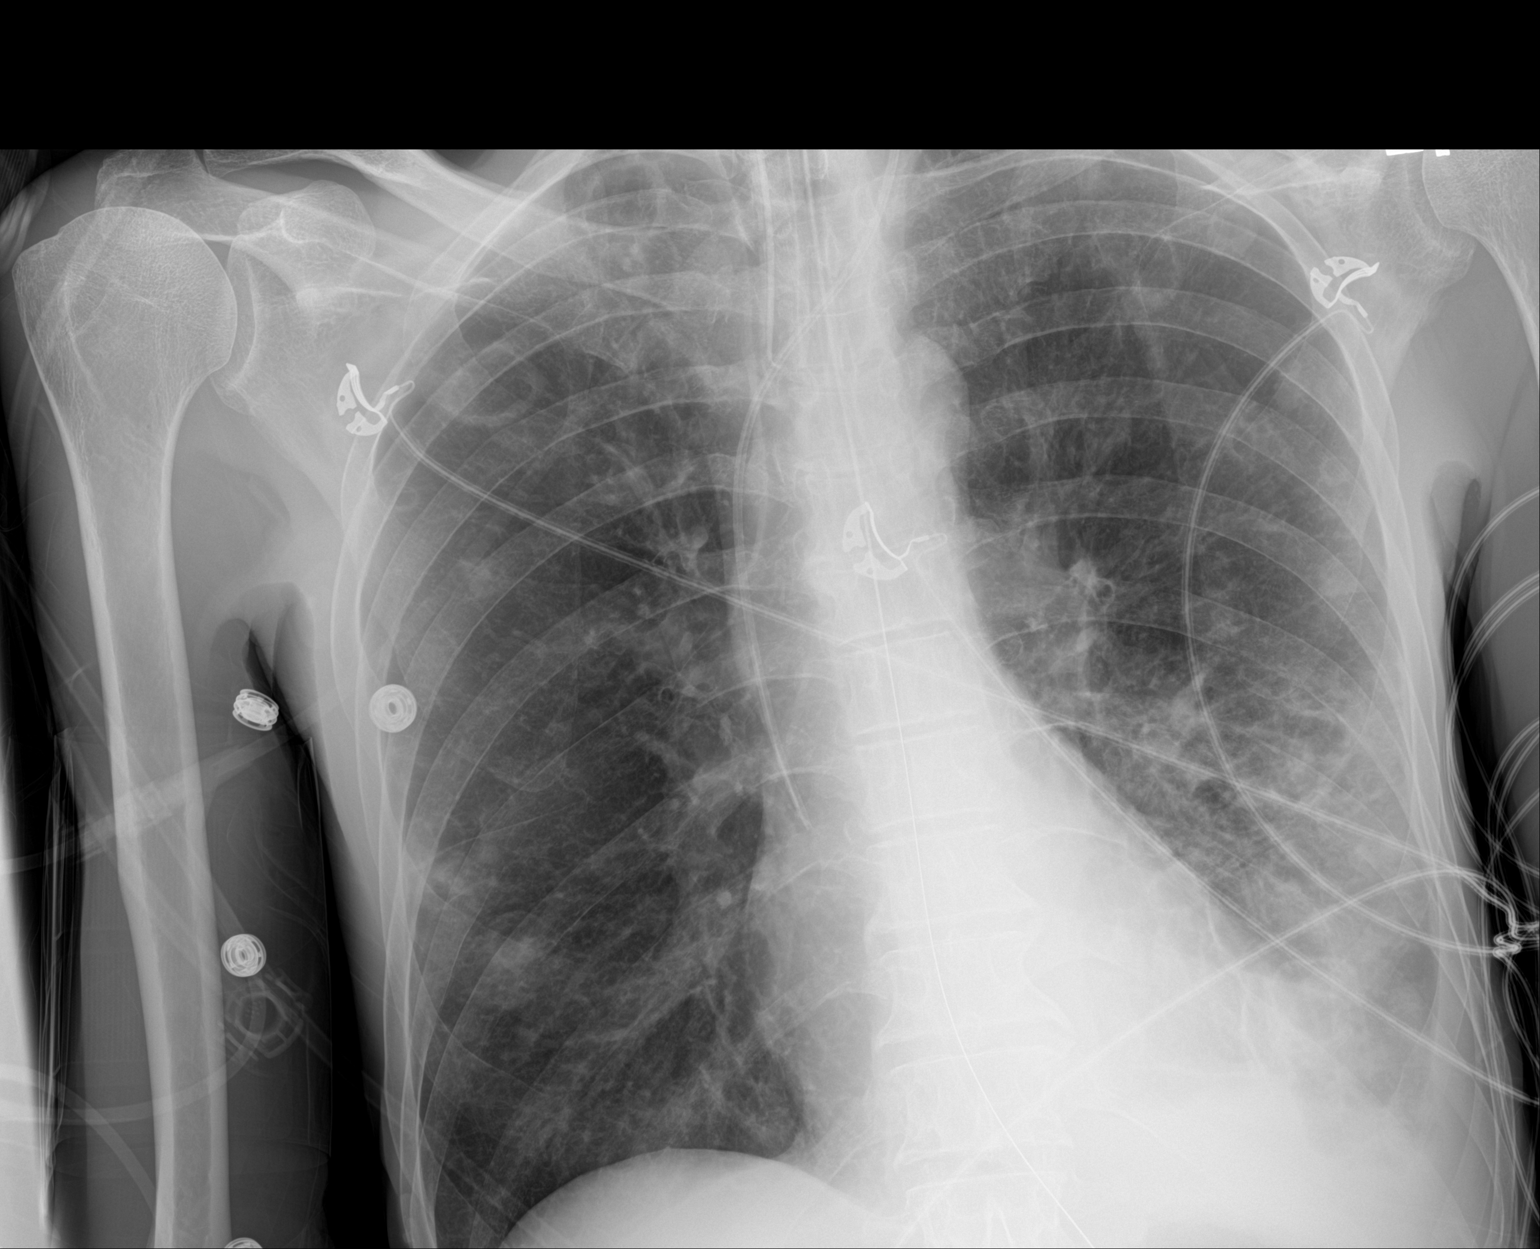

[1 of 1 positions shown; findings below may reference images not displayed]

FINDINGS: Interval intubation, tip of the endotracheal tube is about 3.8 cm
superior to the carina. Esophageal tube tip is below the diaphragm
but non included. Left IJ central venous catheter tip over the
distal SVC. No left pneumothorax. Bilateral cavitary lesions.
Progressive consolidation at the left lung base. Stable
cardiomediastinal silhouette.
IMPRESSION: 1. Support lines and tubes as above. Left-sided central venous
catheter tip over the distal SVC. No left pneumothorax
2. Worsening consolidation at the left lung base.
3. Multiple bilateral cavitary lesions which may reflect septic
emboli

## 2019-02-22 IMAGING — DX DG CHEST 1V PORT
2 series · 2 of 2 positions shown · non-contrast
Comparison: [DATE]

CLINICAL DATA: Worsening dyspnea

EXAM:
PORTABLE CHEST 1 VIEW

[chest ap (1 of 2)]
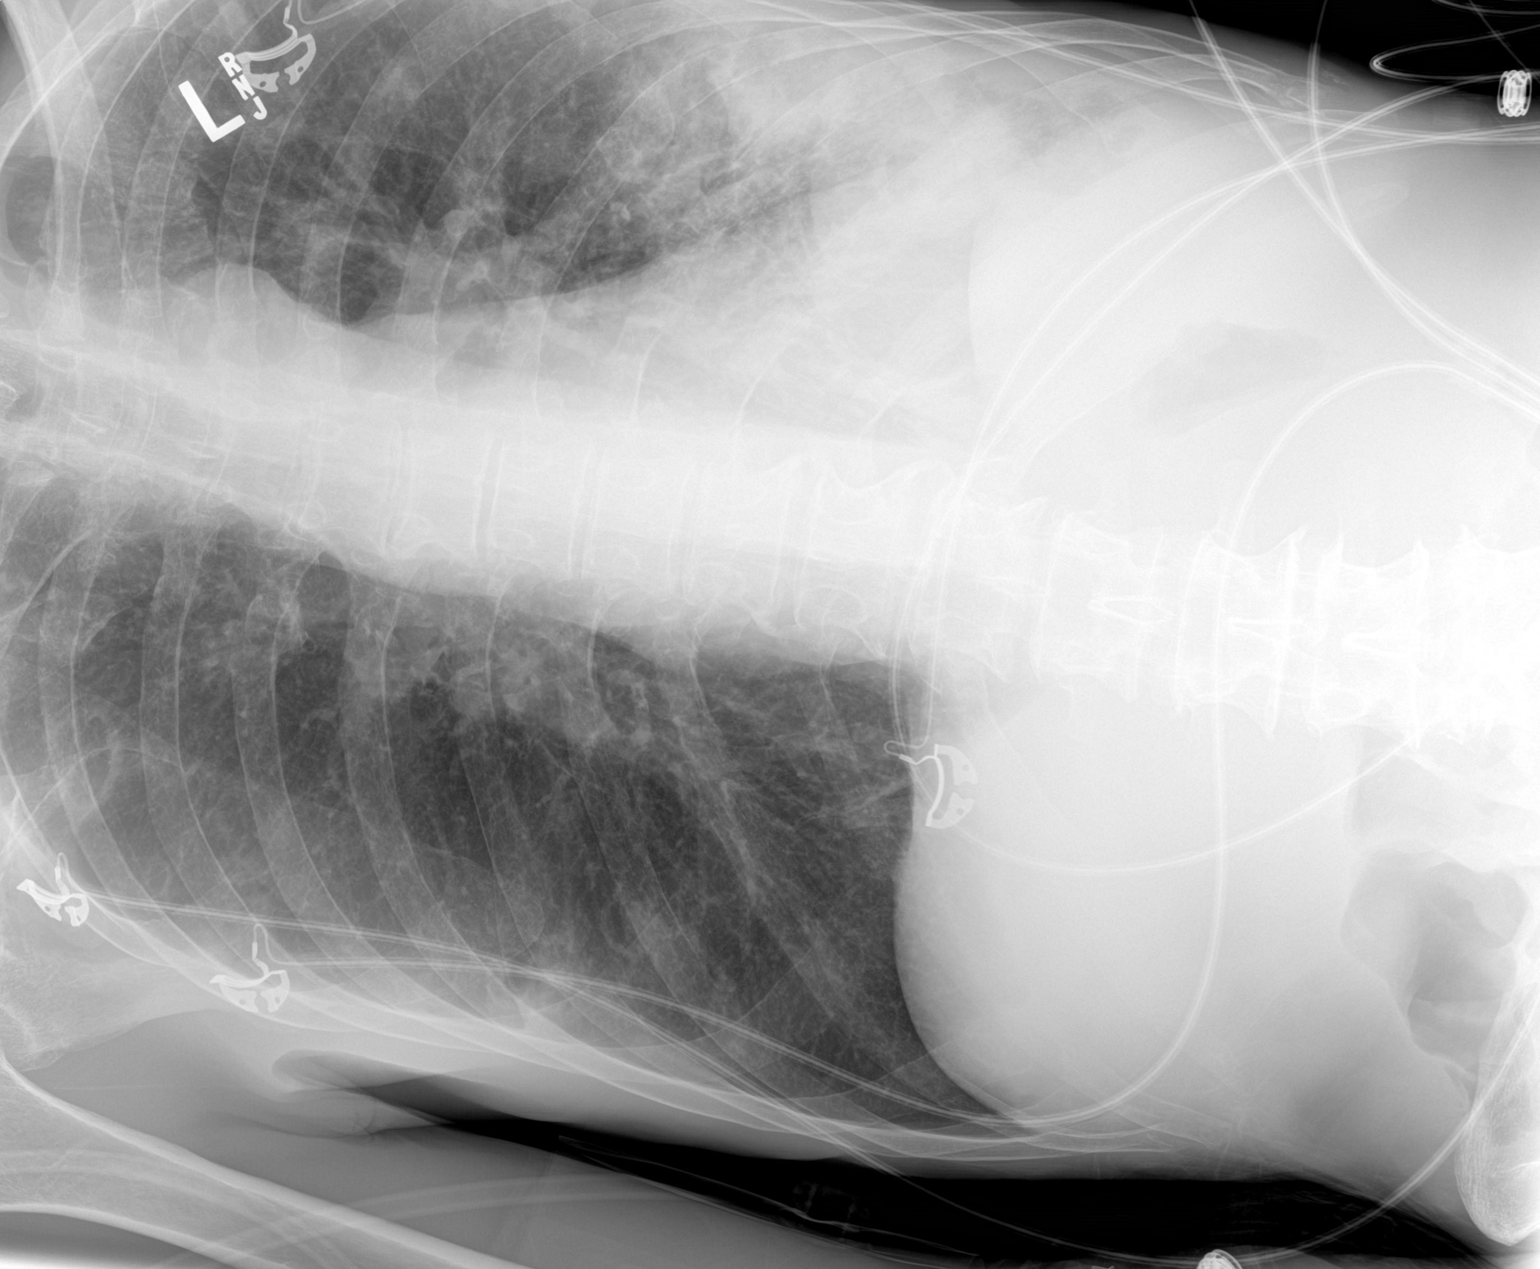

[chest ap (2 of 2)]
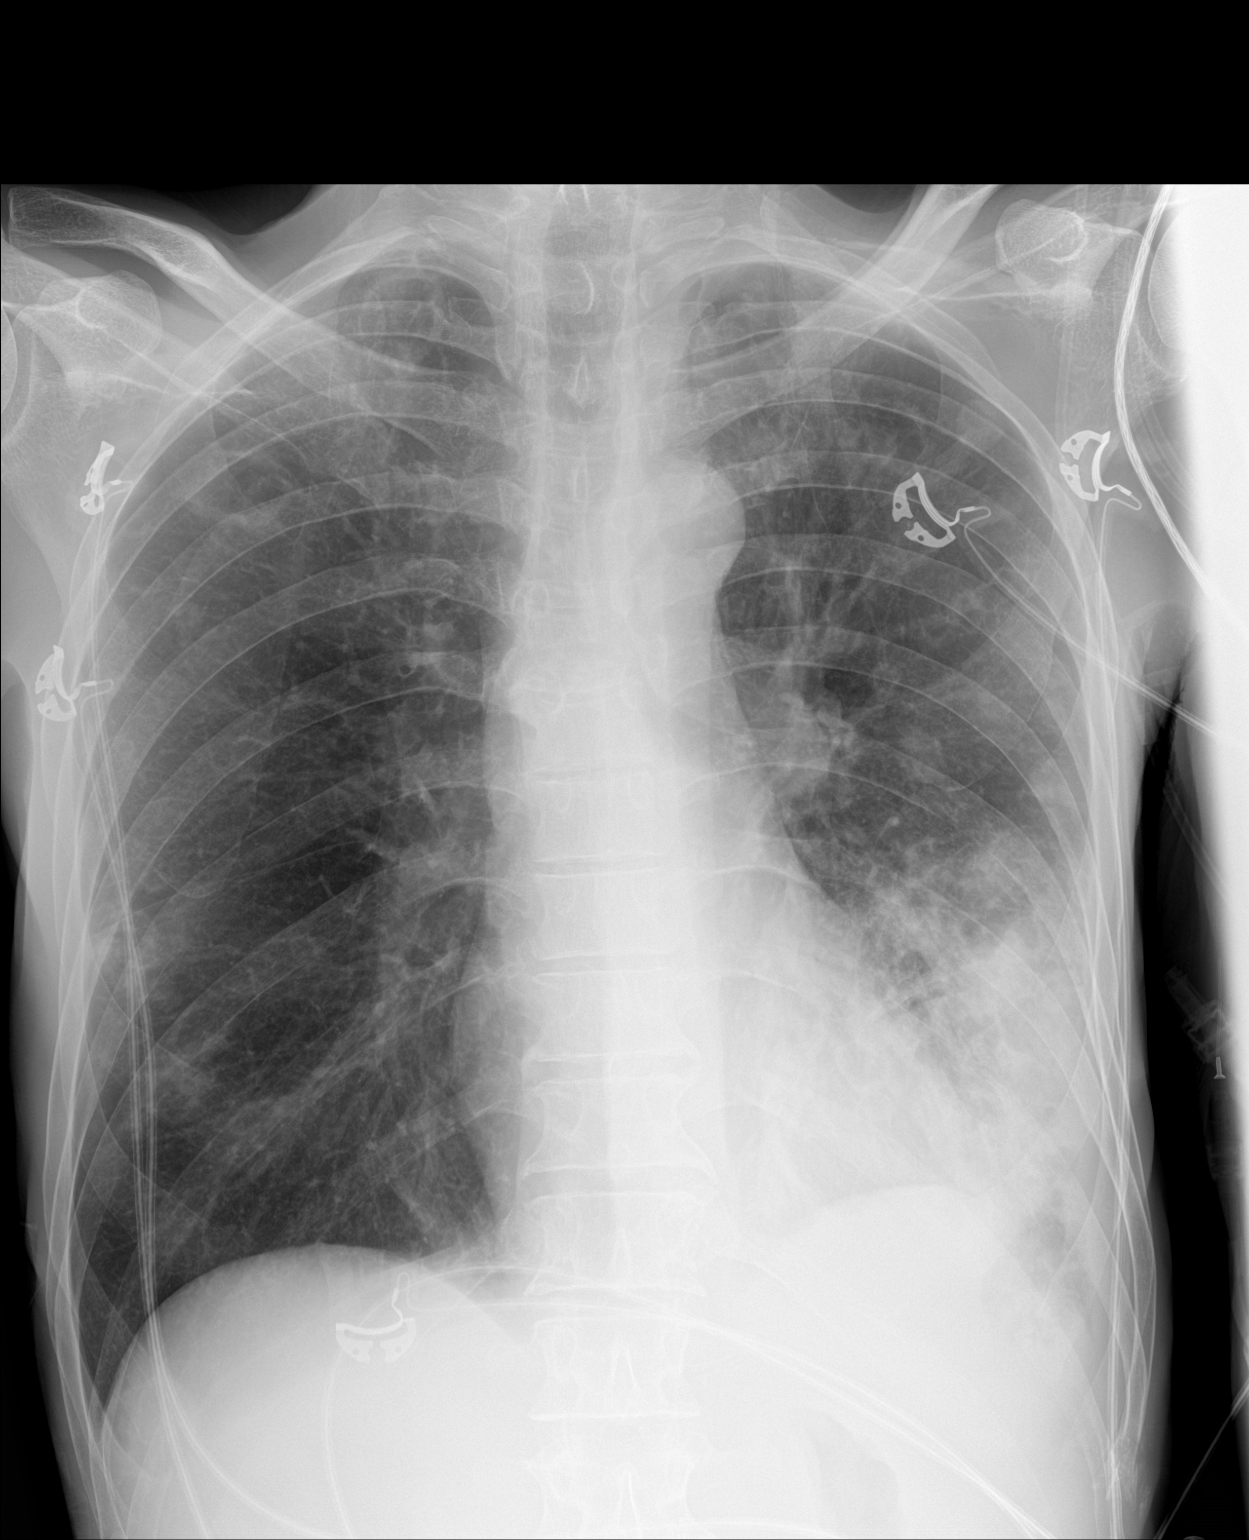

[2 of 2 positions shown; findings below may reference images not displayed]

FINDINGS: The cardiomediastinal silhouette is unchanged from prior exam. There
is worsening patchy airspace opacity at the left lung base. There
are multiple rounded pulmonary nodules within the periphery of the
right lower lung and left upper lung as on the prior exam.
IMPRESSION: Worsening airspace opacity in the left lower lung which is
concerning for pneumonia.

Again noted are multiple bilateral rounded pulmonary nodular
opacities throughout both lungs. This could be due to septic emboli.

## 2019-02-22 IMAGING — DX DG ABD PORTABLE 1V
1 series · 1 of 1 positions shown · non-contrast
Comparison: [DATE]

CLINICAL DATA: OG tube placement

EXAM:
PORTABLE ABDOMEN - 1 VIEW

[abdomen kub]
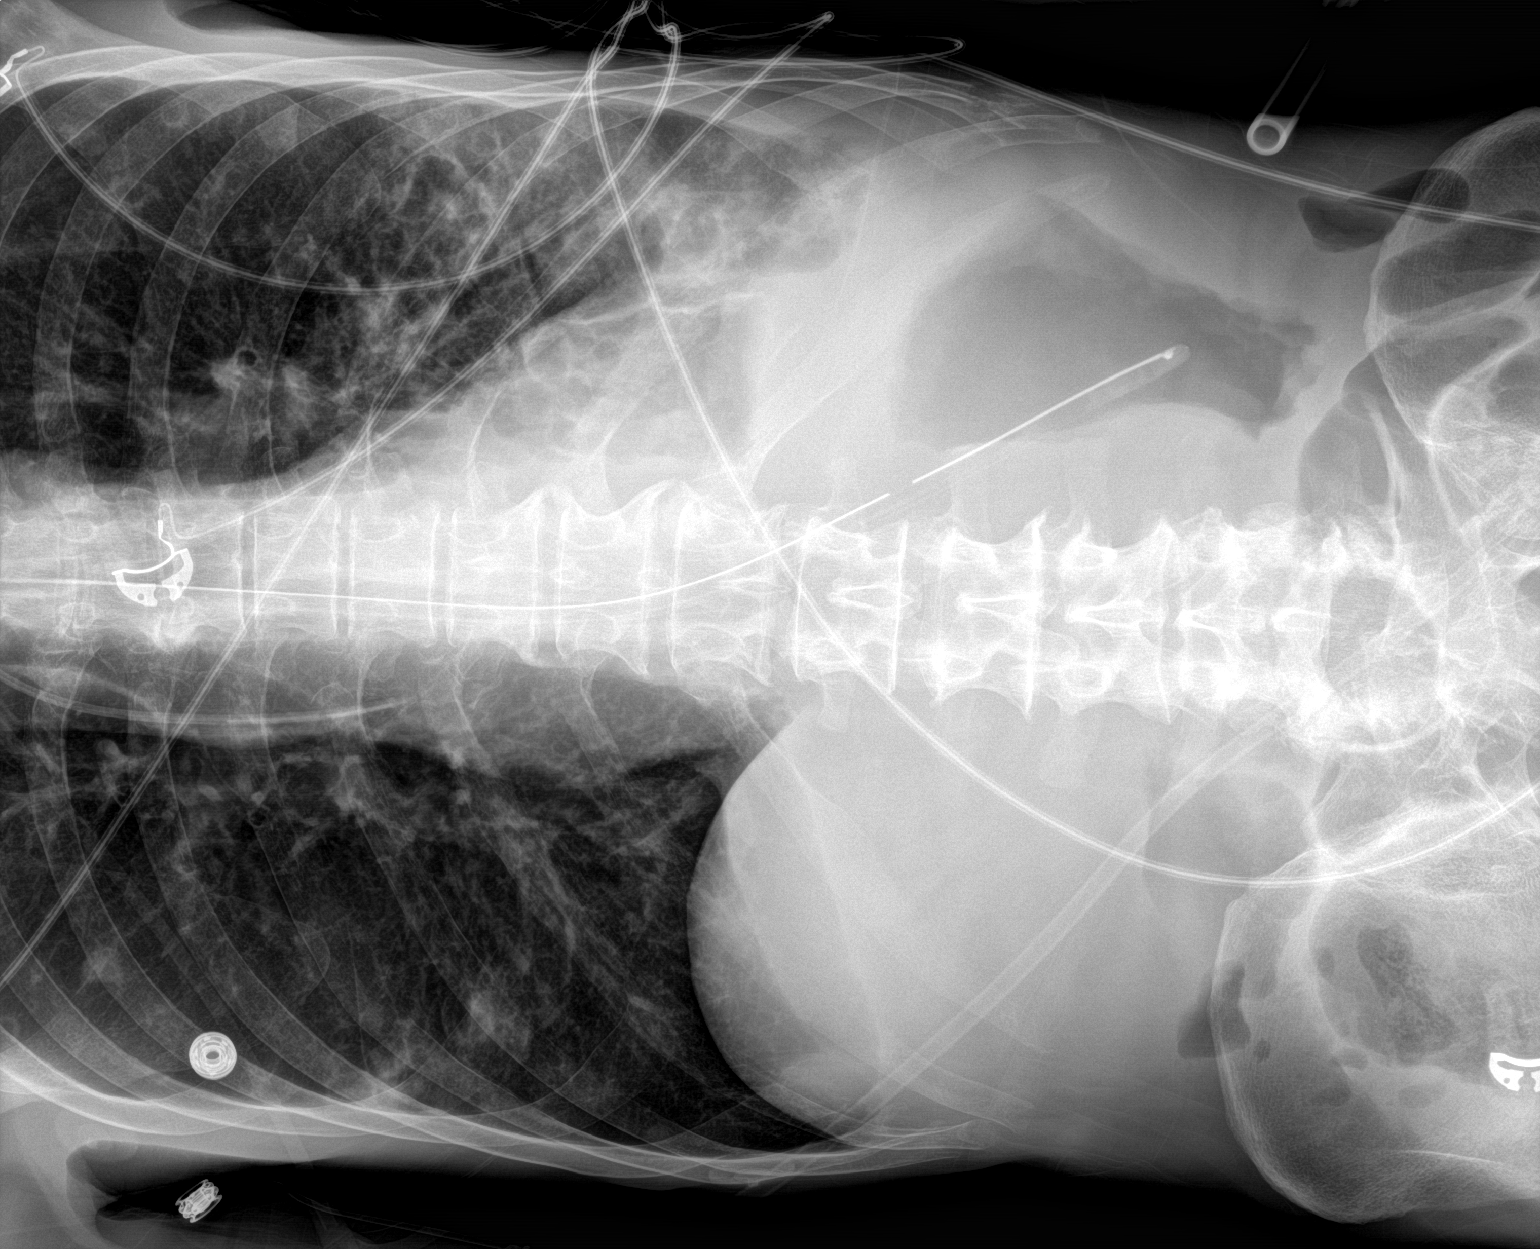

[1 of 1 positions shown; findings below may reference images not displayed]

FINDINGS: Esophageal tube tip overlies the mid gastric region, side-port in
the region of the cardia. Small left-sided pleural effusion with
airspace disease at the left base. Cavitary lung lesions.
IMPRESSION: 1. Esophageal tube tip overlies the gastric body
2. Small left pleural effusion with airspace disease at the left
base. Multiple cavitary lung lesions.

## 2019-02-22 IMAGING — CT CT GUIDANCE NEEDLE PLACEMENT
1 of 4 series · 13 of 32 positions shown, 19 images · non-contrast
Comparison: none

INDICATION: 47-year-old male IV drug abuser with osteomyelitis discitis and
small bilateral psoas abscesses. The abscesses are currently too
small for drain placement, however we can proceed with CT-guided
aspiration to provide material for culture.

[Series 2: i-spiral 5.0 b31f · axial · 0.98mm/px · z∈[+997,+1144]mm · 13 of 50 slices shown, 19 images]
[im 4/50  soft-tissue]
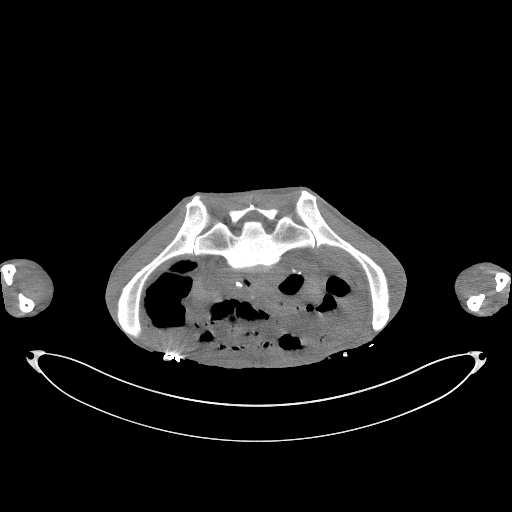
[im 4/50  bone]
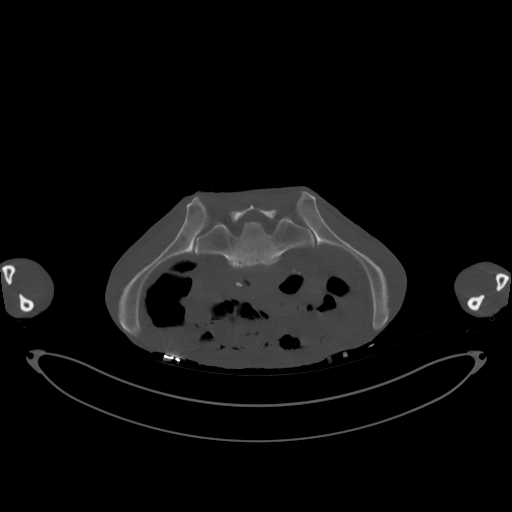
[im 8/50  soft-tissue]
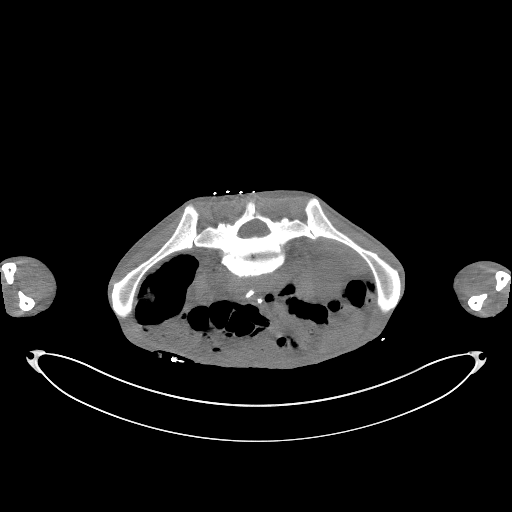
[im 11/50  soft-tissue]
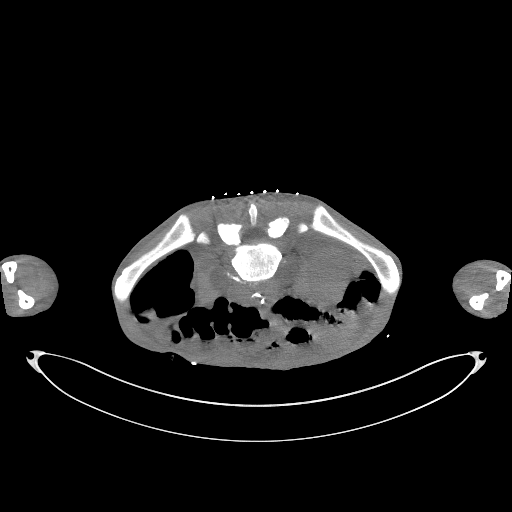
[im 15/50  soft-tissue]
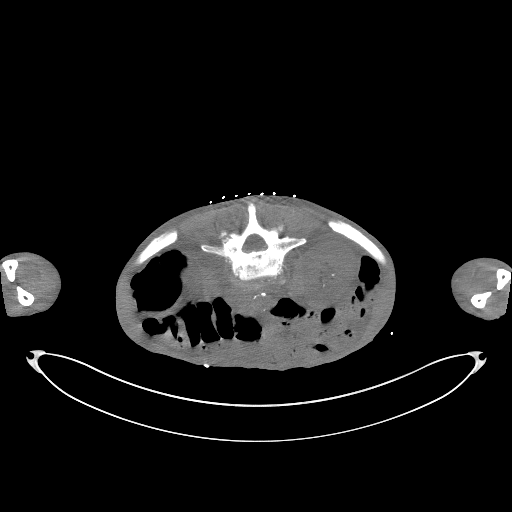
[im 18/50  soft-tissue]
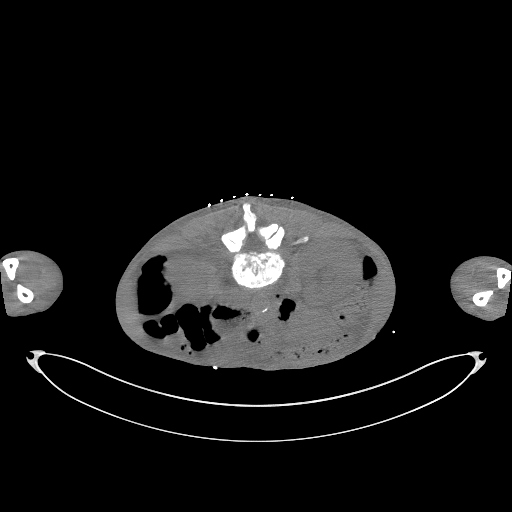
[im 22/50  soft-tissue]
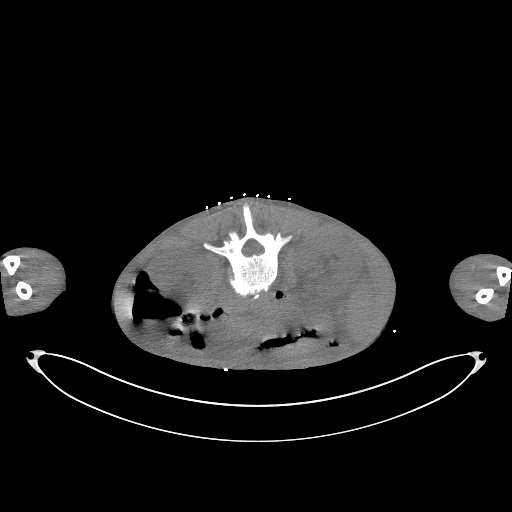
[im 25/50  soft-tissue]
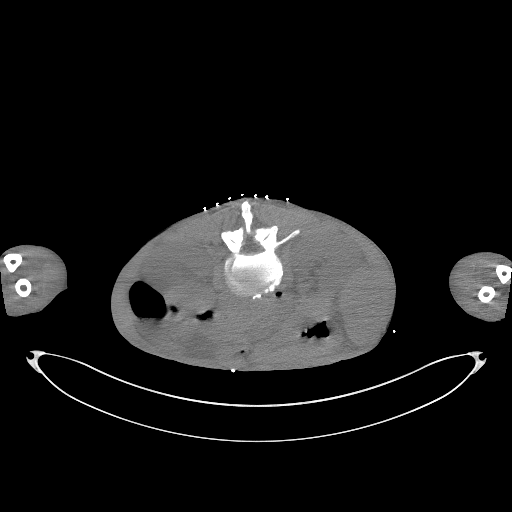
[im 29/50  soft-tissue]
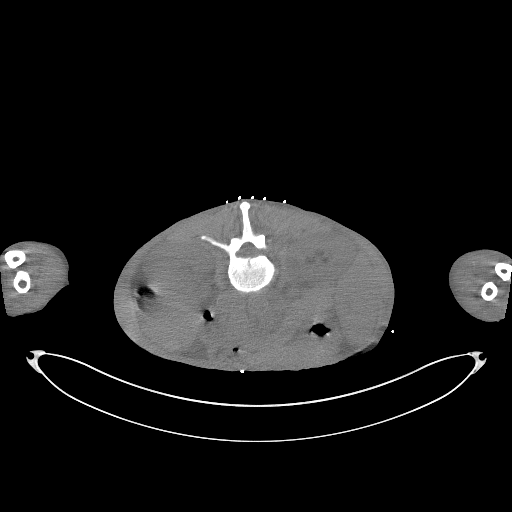
[im 32/50  soft-tissue]
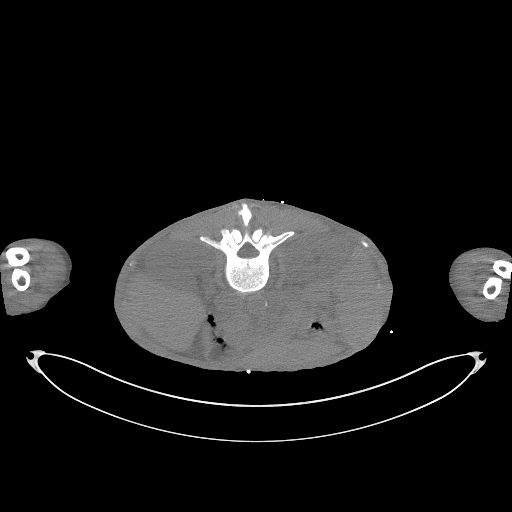
[im 32/50  bone]
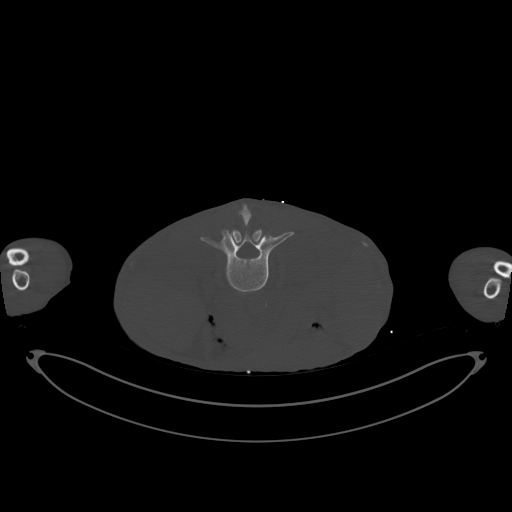
[im 36/50  soft-tissue]
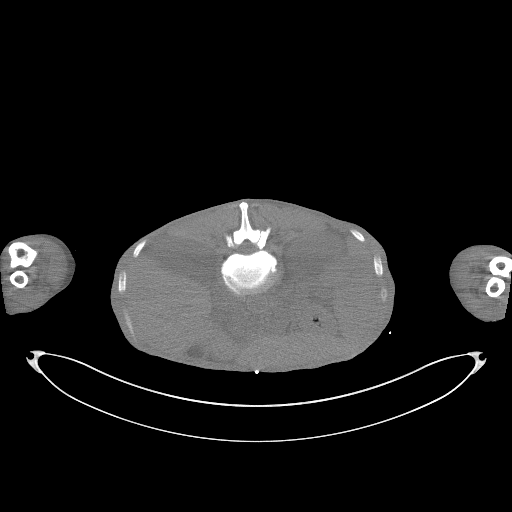
[im 36/50  lung]
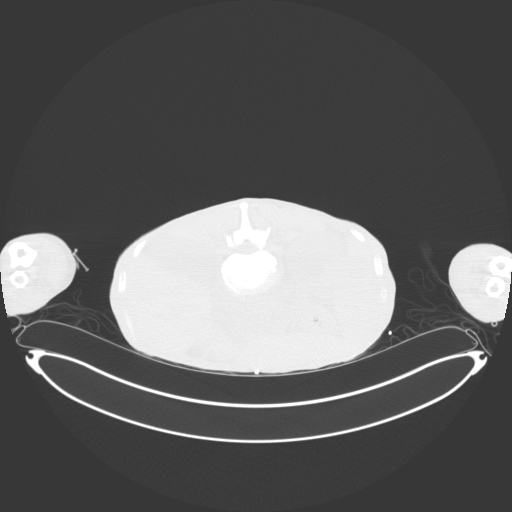
[im 39/50  soft-tissue]
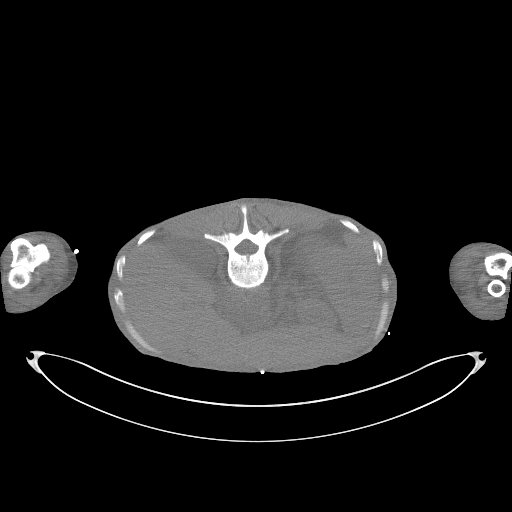
[im 39/50  lung]
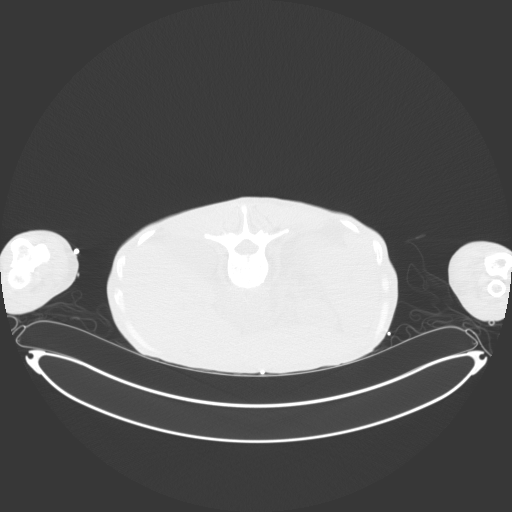
[im 43/50  soft-tissue]
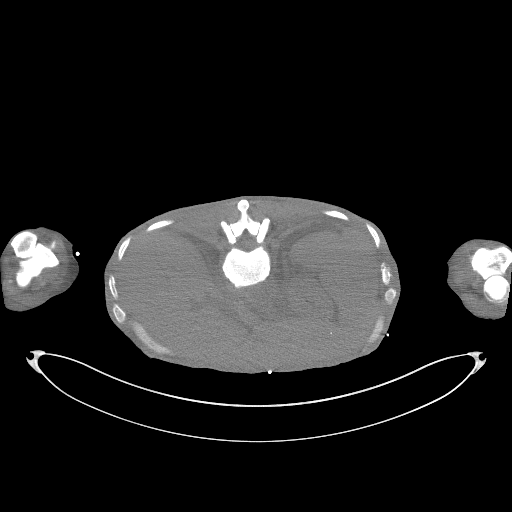
[im 43/50  lung]
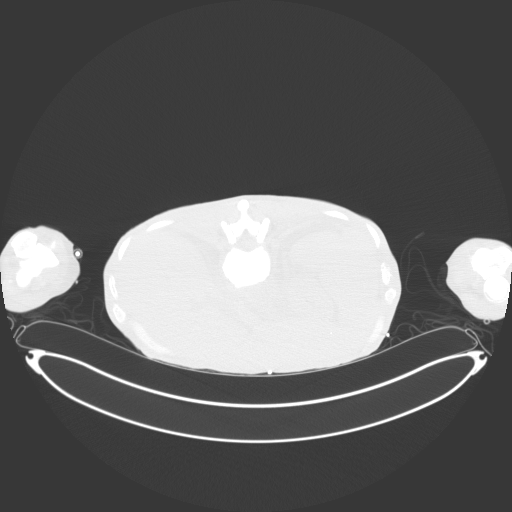
[im 46/50  soft-tissue]
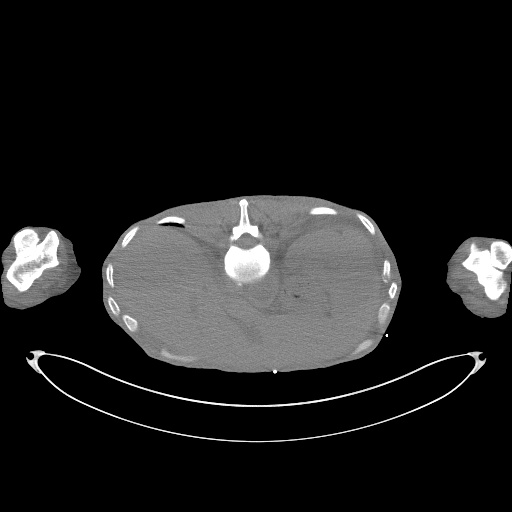
[im 46/50  lung]
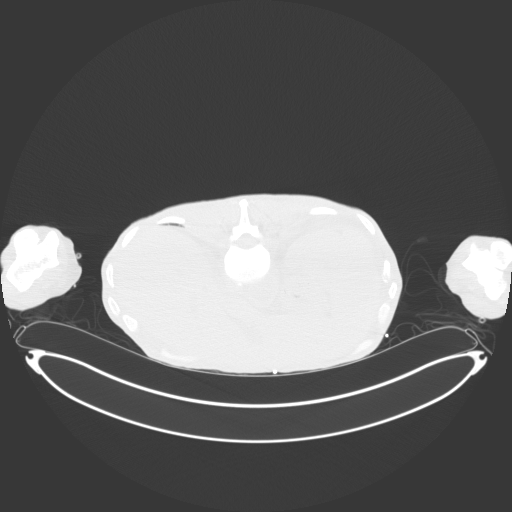

[13 of 32 positions shown; findings below may reference images not displayed]

EXAM:
CT-guided aspiration

MEDICATIONS:
The patient is currently admitted to the hospital and receiving
intravenous antibiotics. The antibiotics were administered within an
appropriate time frame prior to the initiation of the procedure.

ANESTHESIA/SEDATION:
Fentanyl 100 mcg IV; Versed 2 mg IV

Moderate Sedation Time:  15 minutes

The patient was continuously monitored during the procedure by the
interventional radiology nurse under my direct supervision.

COMPLICATIONS:
None immediate.

PROCEDURE:
Informed written consent was obtained from the patient after a
thorough discussion of the procedural risks, benefits and
alternatives. All questions were addressed. A timeout was performed
prior to the initiation of the procedure.

A planning axial CT scan was performed. The fluid collections within
the right psoas muscle were successfully identified. A suitable skin
entry site was selected and marked. The overlying skin was sterilely
prepped and draped in the standard fashion using chlorhexidine skin
prep. Local anesthesia was attained by infiltration with 1%
lidocaine. A small dermatotomy was made. Under intermittent CT
guidance, an 18 gauge trocar needle was advanced into the fluid
collection. Aspiration was then performed yielding approximately 18
mL of purulent fluid. Samples were sent for Gram stain and culture.

The trocar needle was removed. Post aspiration CT imaging
demonstrates no evidence of hematoma or other immediate
complication.
IMPRESSION: Successful CT-guided aspiration of a right psoas abscess yielding 18
mL purulent fluid which was sent for culture.

## 2019-02-22 IMAGING — CT CT HEAD W/O CM
3 series · 15 of 47 positions shown, 18 images · non-contrast
Comparison: None.

CLINICAL DATA: Encephalopathy

EXAM:
CT HEAD WITHOUT CONTRAST
TECHNIQUE: Contiguous axial images were obtained from the base of the skull
through the vertex without intravenous contrast.

[Series 2: head wo · axial · 0.47mm/px · z∈[-186,-56]mm · 9 of 32 slices shown, 12 images]
[im 3/32  brain]
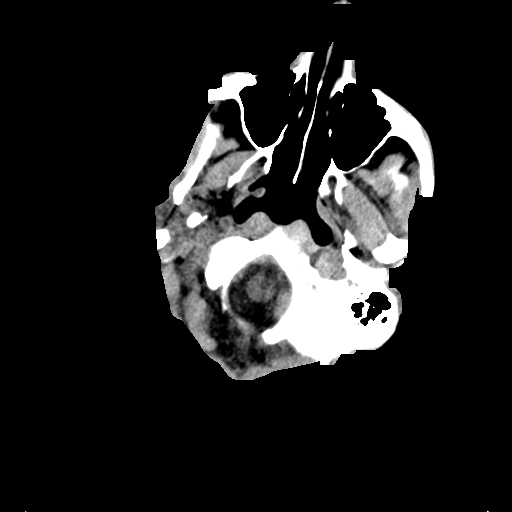
[im 3/32  bone]
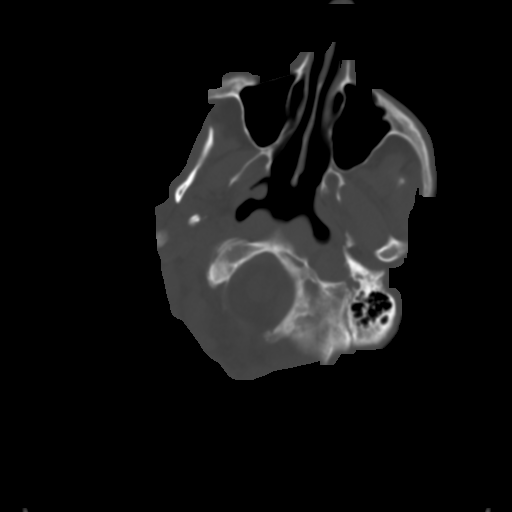
[im 6/32  brain]
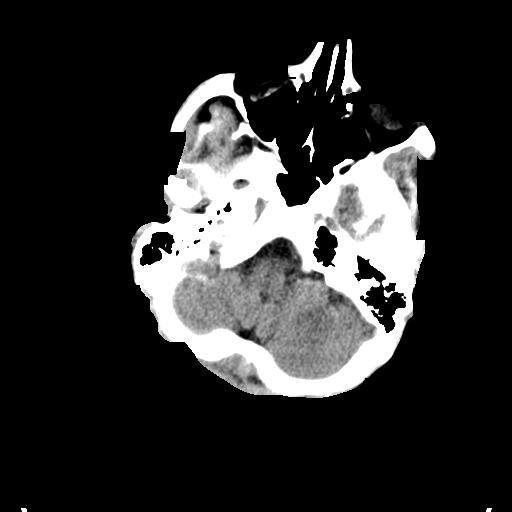
[im 9/32  brain]
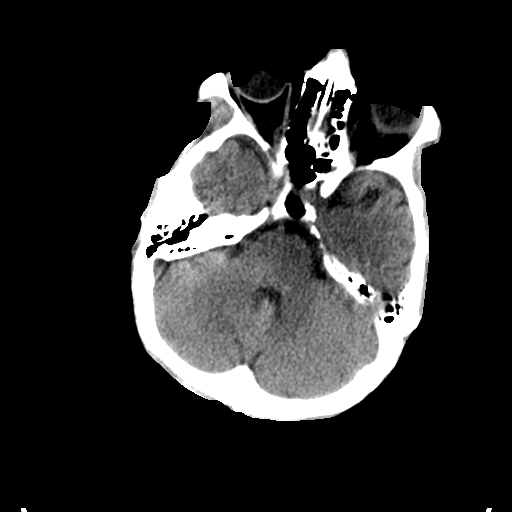
[im 12/32  brain]
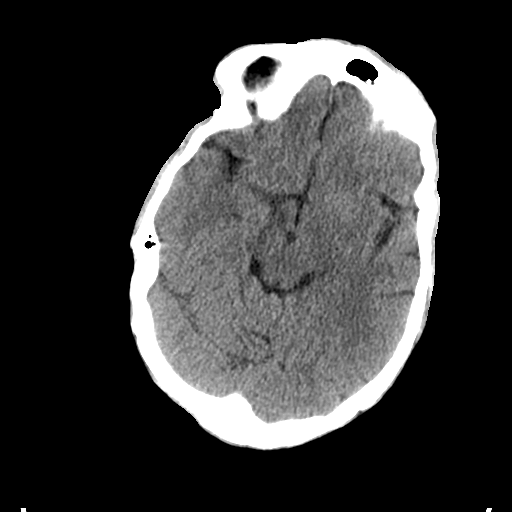
[im 17/32  brain]
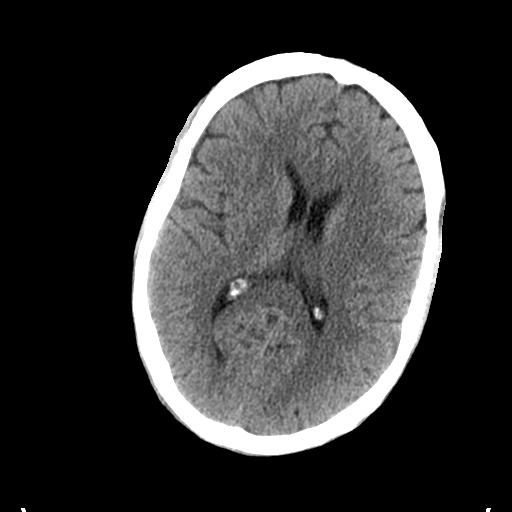
[im 17/32  bone]
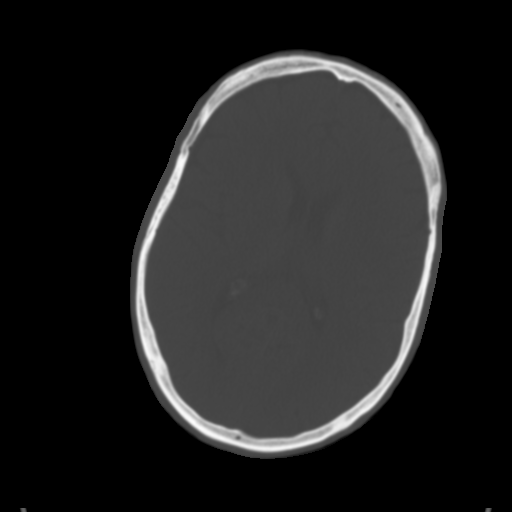
[im 20/32  brain]
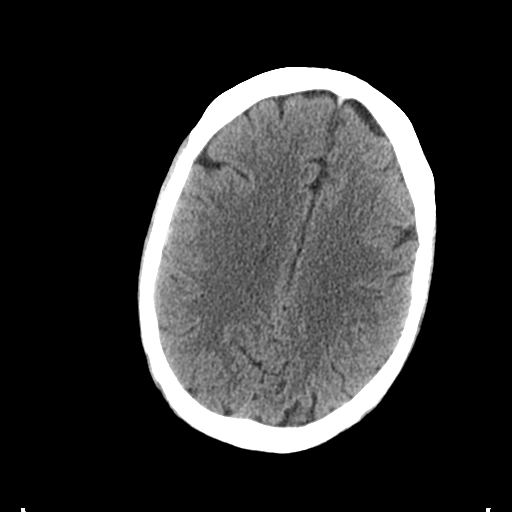
[im 23/32  brain]
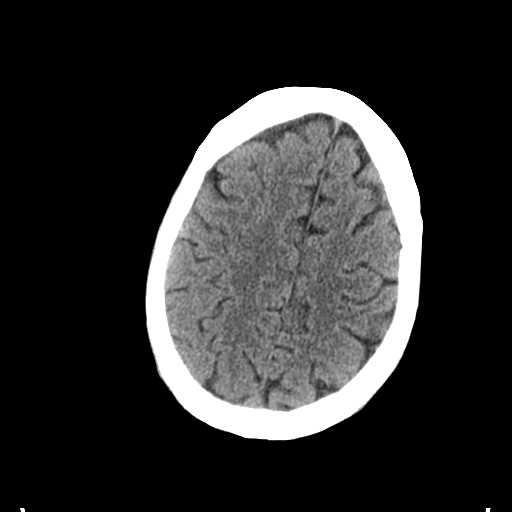
[im 26/32  brain]
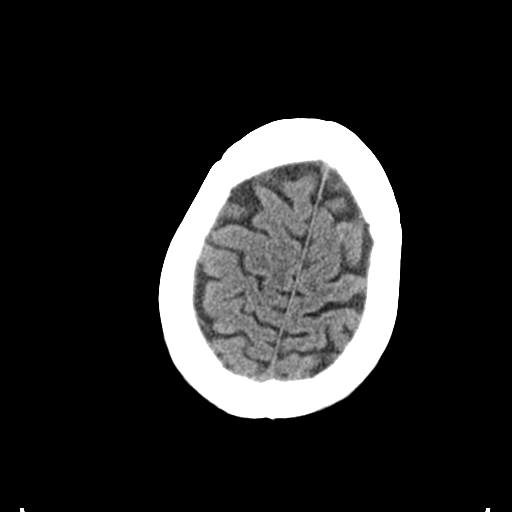
[im 29/32  brain]
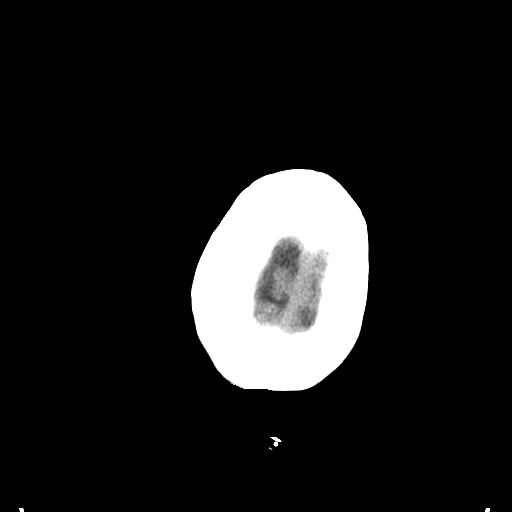
[im 29/32  bone]
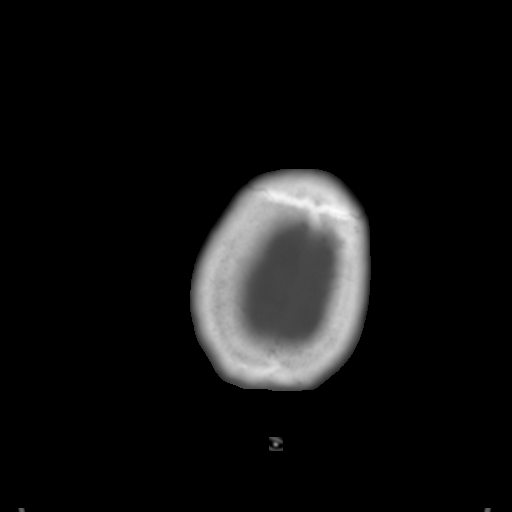

[Series 4: coronal soft tissue · coronal · 0.29mm/px · 3 of 66 slices shown]
[im 22/66  brain]
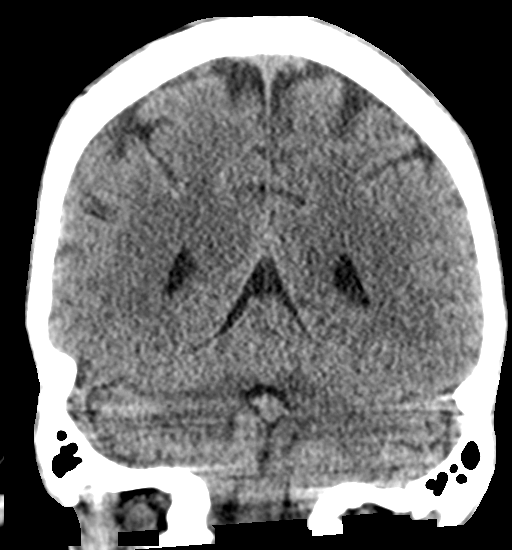
[im 29/66  brain]
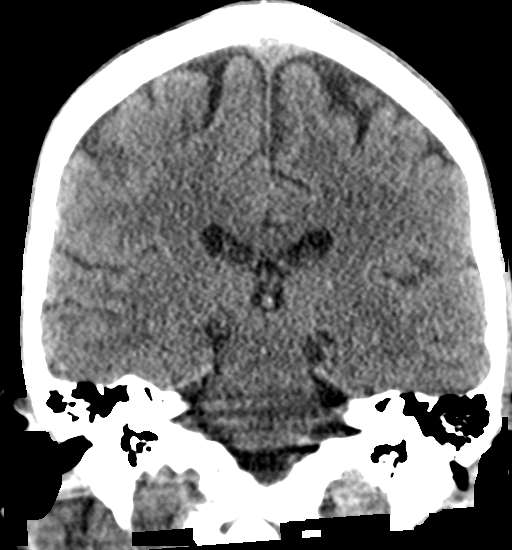
[im 37/66  brain]
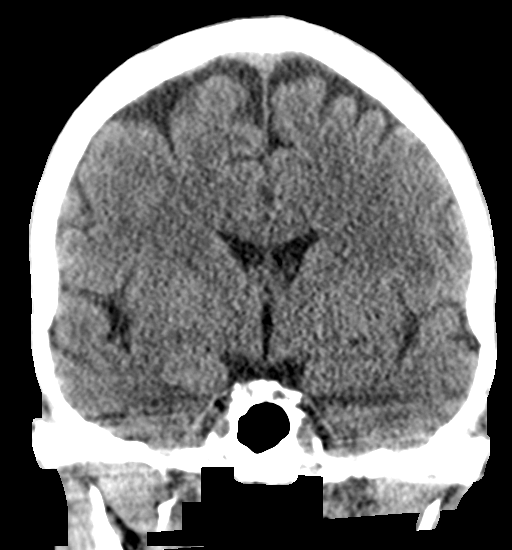

[Series 5: sagittal soft tissue · sagittal · 0.31mm/px · 3 of 49 slices shown]
[im 18/49  brain]
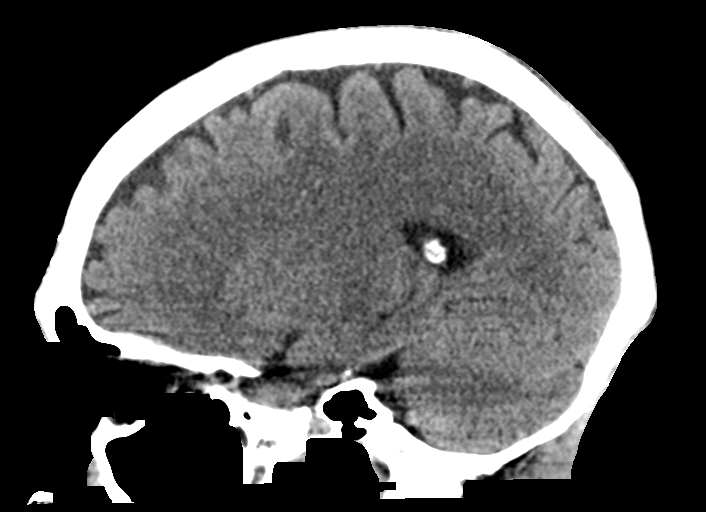
[im 25/49  brain]
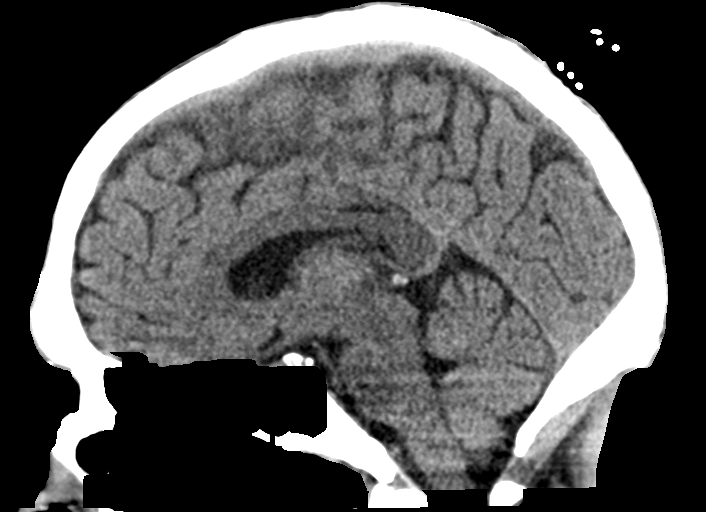
[im 31/49  brain]
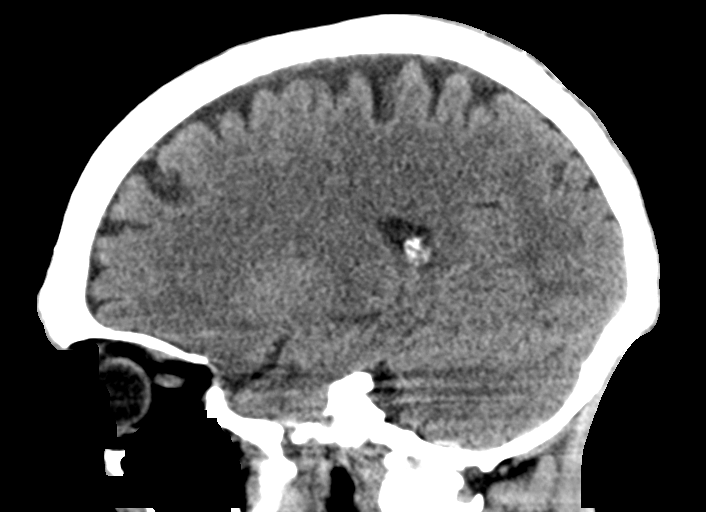

[15 of 47 positions shown; findings below may reference images not displayed]

FINDINGS: Brain: There is no mass, hemorrhage or extra-axial collection. The
size and configuration of the ventricles and extra-axial CSF spaces
are normal. The brain parenchyma is normal, without acute or chronic
infarction.

Vascular: No abnormal hyperdensity of the major intracranial
arteries or dural venous sinuses. No intracranial atherosclerosis.

Skull: The visualized skull base, calvarium and extracranial soft
tissues are normal.

Sinuses/Orbits: No fluid levels or advanced mucosal thickening of
the visualized paranasal sinuses. No mastoid or middle ear effusion.
The orbits are normal.
IMPRESSION: Normal head CT.

## 2019-02-22 IMAGING — MR MR HEAD W/O CM
9 of 10 series · 35 of 48 positions shown · non-contrast
Comparison: None.

CLINICAL DATA: Encephalopathy, endocarditis and osteomyelitis

EXAM:
MRI HEAD WITHOUT CONTRAST
MRA HEAD WITHOUT CONTRAST
TECHNIQUE: Multiplanar, multiecho pulse sequences of the brain and surrounding
structures were obtained without intravenous contrast. Angiographic
images of the head were obtained using MRA technique without
contrast.

[Series 3: DWI · axial · 3.0mm · 1.09mm/px · z∈[-0,+150]mm · 8 of 104 slices shown (1 of 4)]
[im 1/104]
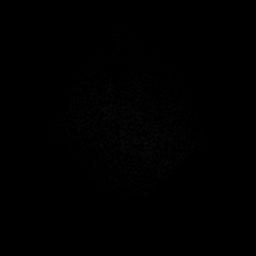
[im 15/104]
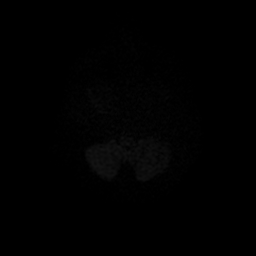
[im 30/104]
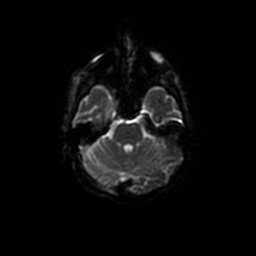
[im 45/104]
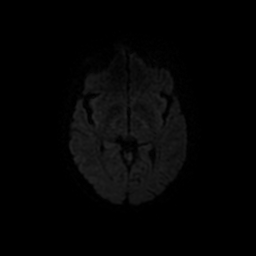
[im 59/104]
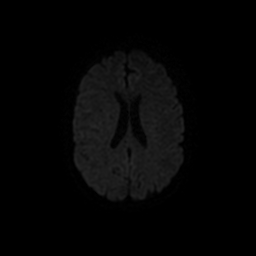
[im 74/104]
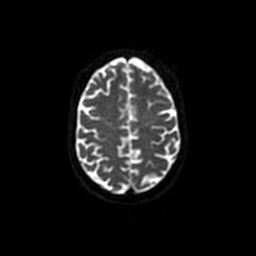
[im 89/104]
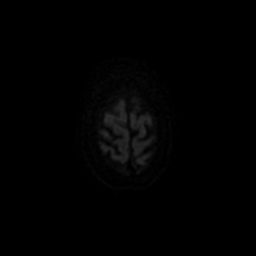
[im 104/104]
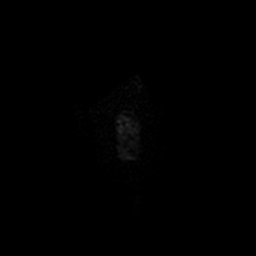

[Series 4: T1 · sagittal · 5.0mm · 0.47mm/px · 2 of 20 slices shown (1 of 2)]
[im 1/20]
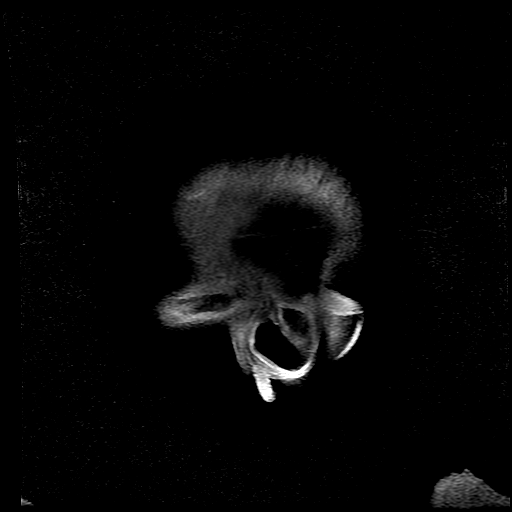
[im 20/20]
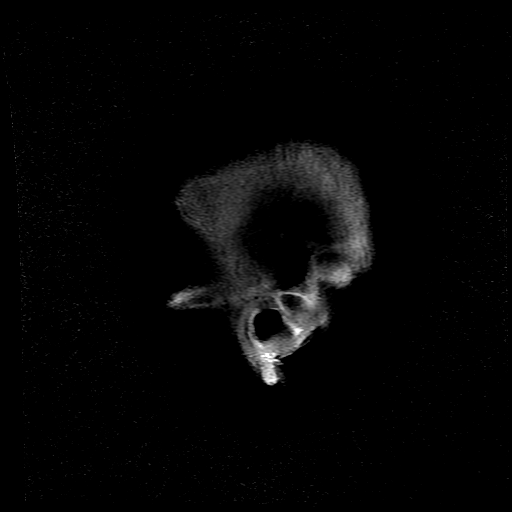

[Series 5: T2 · axial · 5.0mm · 0.43mm/px · z∈[-15,+143]mm · 2 of 28 slices shown]
[im 1/28]
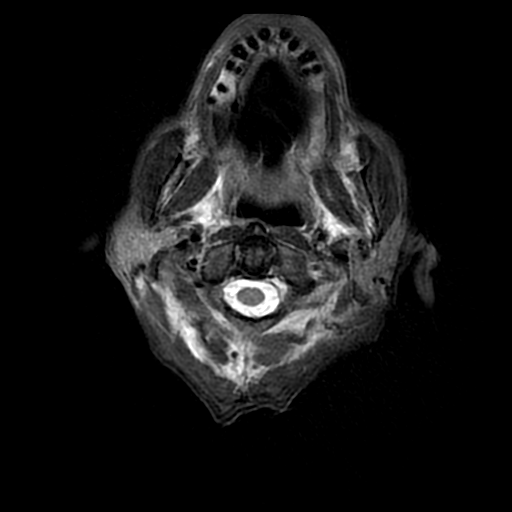
[im 28/28]
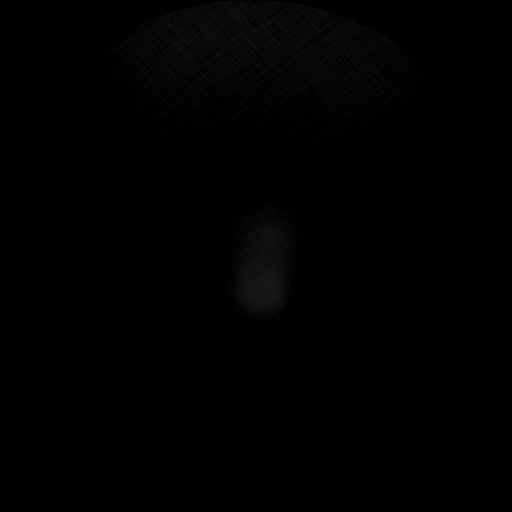

[Series 6: fast (id) mt · axial · 1.4mm · 0.39mm/px · 1 of 136 slices shown]
[im 1/136]
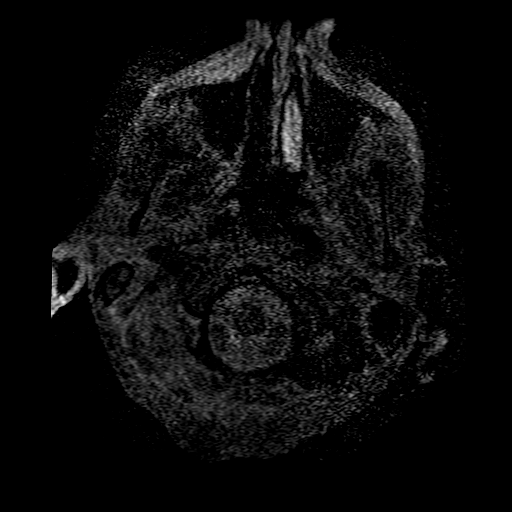

[Series 7: FLAIR · axial · 3.0mm · 0.43mm/px · z∈[-15,+143]mm · 2 of 28 slices shown]
[im 1/28]
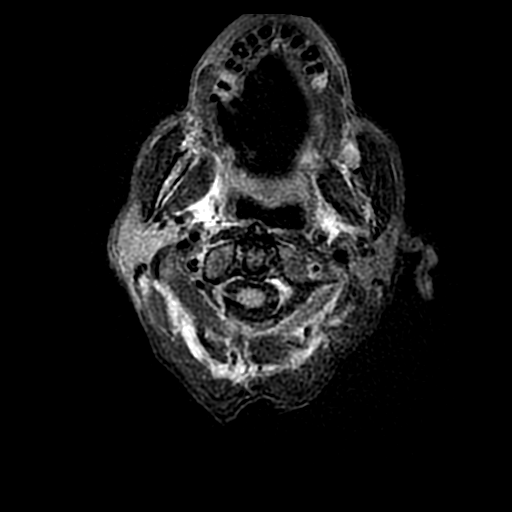
[im 28/28]
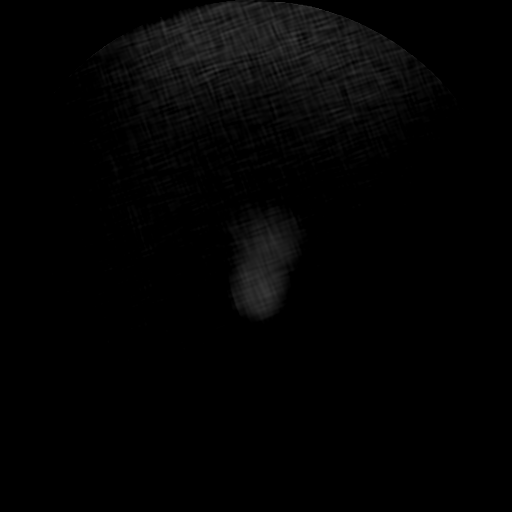

[Series 9: T1 · axial · 3.0mm · 0.47mm/px · z∈[-4,+165]mm · 5 of 60 slices shown (2 of 2)]
[im 1/60]
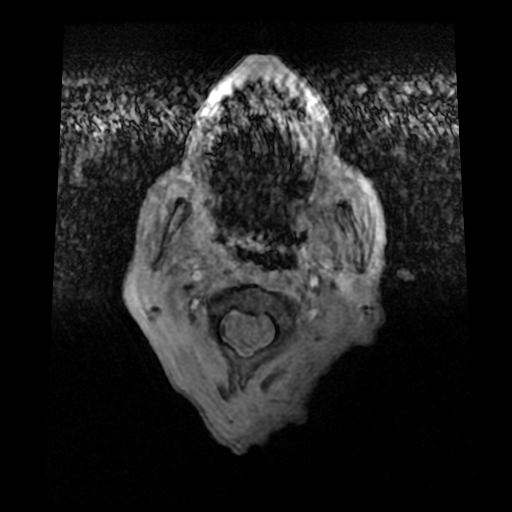
[im 15/60]
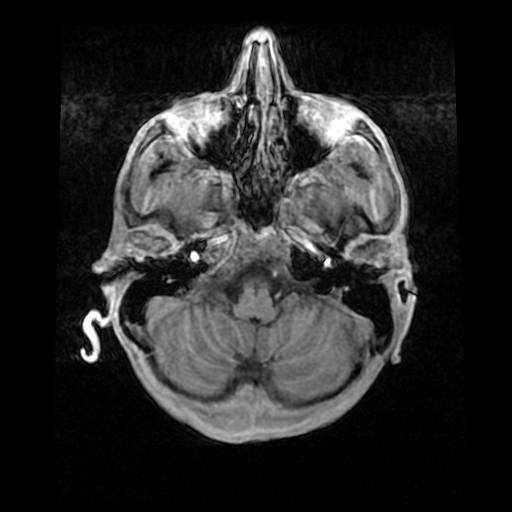
[im 30/60]
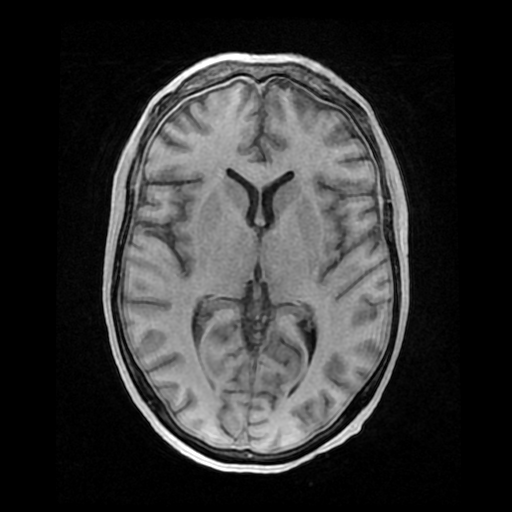
[im 45/60]
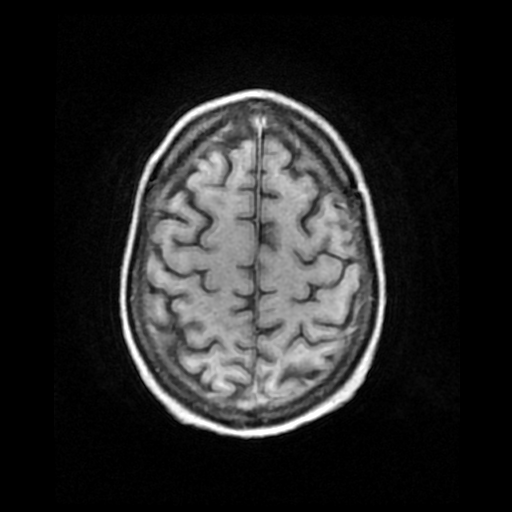
[im 60/60]
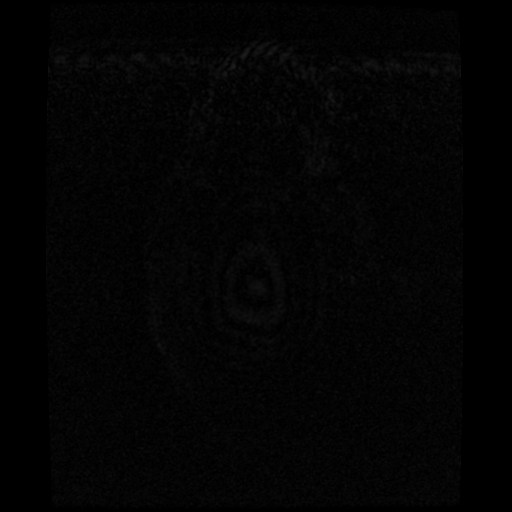

[Series 10: DWI · coronal · 4.0mm · 1.09mm/px · 7 of 90 slices shown (2 of 4)]
[im 1/90]
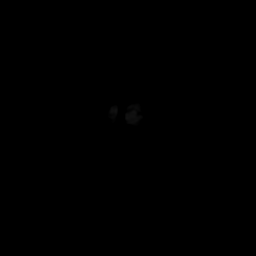
[im 15/90]
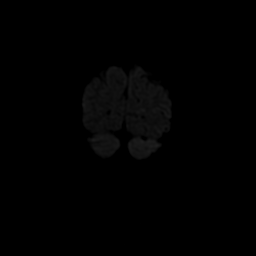
[im 30/90]
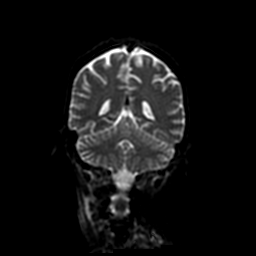
[im 45/90]
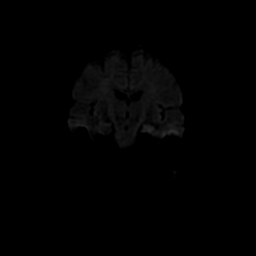
[im 60/90]
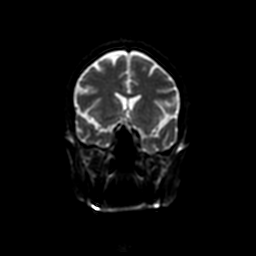
[im 75/90]
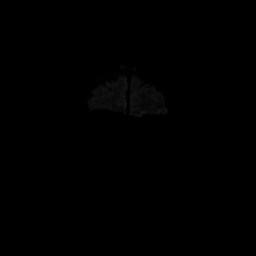
[im 90/90]
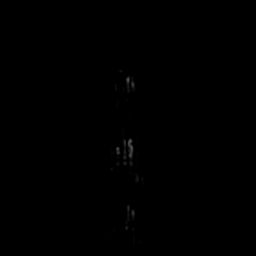

[Series 300: DWI · axial · 3.0mm · 1.09mm/px · z∈[-0,+150]mm · 4 of 52 slices shown (3 of 4)]
[im 1/52]
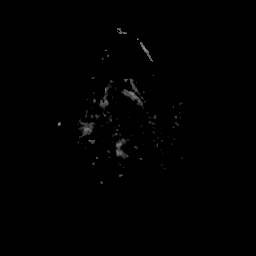
[im 18/52]
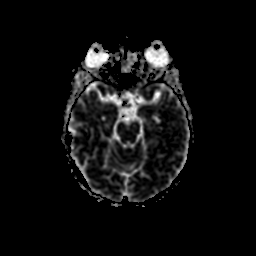
[im 35/52]
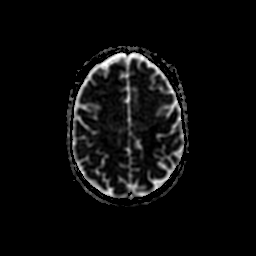
[im 52/52]
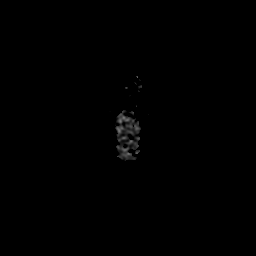

[Series 1000: DWI · coronal · 4.0mm · 1.09mm/px · 4 of 45 slices shown (4 of 4)]
[im 1/45]
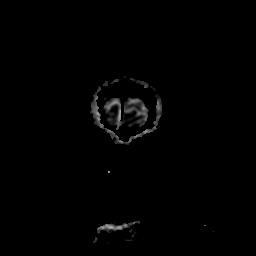
[im 15/45]
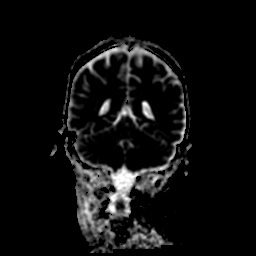
[im 30/45]
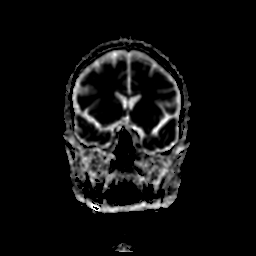
[im 45/45]
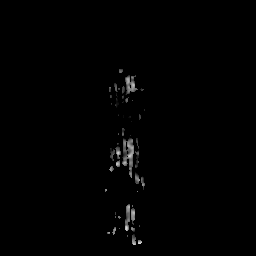

[35 of 48 positions shown; findings below may reference images not displayed]

FINDINGS: Motion artifact is present.

MRI HEAD FINDINGS

Brain: There is no acute infarction or intracranial hemorrhage.
There is no intracranial mass, mass effect, edema, or hydrocephalus.
Thin pachymeningeal thickening is present.

Vascular: Major vessel flow voids at the skull base are preserved.

Skull and upper cervical spine: Marrow signal is within normal
limits.

Sinuses/Orbits: Minor paranasal sinus mucosal thickening. The orbits
are unremarkable.

Other: None.

MRA HEAD FINDINGS

Motion artifact present. There is preserved flow related enhancement
of the intracranial internal carotid arteries and proximal middle
and anterior cerebral arteries. Visualized intracranial vertebral
arteries basilar artery, and proximal posterior cerebral arteries
are also patent. There is fetal or near fetal origin of the left
posterior cerebral artery. No gross aneurysm identified.
IMPRESSION: Motion degraded study without evidence of septic emboli.

Nonspecific thin dural thickening, which could be secondary to
lumbar puncture if recently performed.

Unremarkable proximal intracranial circulation.

## 2019-02-22 MED ORDER — ONDANSETRON HCL 4 MG PO TABS: 4.0000 mg | ORAL_TABLET | Freq: Four times a day (QID) | ORAL | Status: DC | PRN

## 2019-02-22 MED ORDER — FUROSEMIDE 10 MG/ML IJ SOLN
INTRAMUSCULAR | Status: AC
  Filled 2019-02-22: qty 2

## 2019-02-22 MED ORDER — MIDAZOLAM HCL 2 MG/2ML IJ SOLN
INTRAMUSCULAR | Status: AC | PRN
  Administered 2019-02-22: 1 mg via INTRAVENOUS
  Administered 2019-02-22: 18:00:00 2 mg via INTRAVENOUS

## 2019-02-22 MED ORDER — HYDROMORPHONE HCL 1 MG/ML IJ SOLN
1.0000 mg | INTRAMUSCULAR | Status: DC | PRN
  Administered 2019-02-26 – 2019-03-01 (×24): 1 mg via INTRAVENOUS
  Filled 2019-02-22 (×25): qty 1

## 2019-02-22 MED ORDER — FUROSEMIDE 10 MG/ML IJ SOLN
INTRAMUSCULAR | Status: AC
  Administered 2019-02-22: 20 mg
  Filled 2019-02-22: qty 2

## 2019-02-22 MED ORDER — SENNOSIDES-DOCUSATE SODIUM 8.6-50 MG PO TABS
2.0000 | ORAL_TABLET | Freq: Every evening | ORAL | Status: DC | PRN
  Administered 2019-02-23: 2 via ORAL
  Filled 2019-02-22: qty 2

## 2019-02-22 MED ORDER — ACETAMINOPHEN 325 MG PO TABS
650.0000 mg | ORAL_TABLET | Freq: Four times a day (QID) | ORAL | Status: DC | PRN
  Administered 2019-03-04 – 2019-04-14 (×11): 650 mg via ORAL
  Filled 2019-02-22 (×20): qty 2

## 2019-02-22 MED ORDER — PROPOFOL 1000 MG/100ML IV EMUL
5.0000 ug/kg/min | INTRAVENOUS | Status: DC
  Administered 2019-02-22: 18:00:00 5 ug/kg/min via INTRAVENOUS
  Administered 2019-02-23 (×2): 30 ug/kg/min via INTRAVENOUS
  Administered 2019-02-23 – 2019-02-25 (×5): 40 ug/kg/min via INTRAVENOUS
  Filled 2019-02-22 (×10): qty 100

## 2019-02-22 MED ORDER — LEVALBUTEROL HCL 1.25 MG/0.5ML IN NEBU
1.2500 mg | INHALATION_SOLUTION | Freq: Four times a day (QID) | RESPIRATORY_TRACT | Status: DC
  Administered 2019-02-22 – 2019-02-27 (×21): 1.25 mg via RESPIRATORY_TRACT
  Filled 2019-02-22 (×19): qty 0.5

## 2019-02-22 MED ORDER — FENTANYL BOLUS VIA INFUSION
50.0000 ug | INTRAVENOUS | Status: DC | PRN
  Administered 2019-02-22 – 2019-02-25 (×2): 50 ug via INTRAVENOUS
  Filled 2019-02-22: qty 50

## 2019-02-22 MED ORDER — INSULIN ASPART 100 UNIT/ML ~~LOC~~ SOLN
0.0000 [IU] | SUBCUTANEOUS | Status: DC
  Administered 2019-02-22 – 2019-02-23 (×4): 2 [IU] via SUBCUTANEOUS
  Administered 2019-02-23: 4 [IU] via SUBCUTANEOUS
  Administered 2019-02-23 – 2019-02-25 (×14): 2 [IU] via SUBCUTANEOUS
  Administered 2019-02-26: 8 [IU] via SUBCUTANEOUS
  Administered 2019-02-26: 16 [IU] via SUBCUTANEOUS
  Administered 2019-02-26: 4 [IU] via SUBCUTANEOUS
  Administered 2019-02-27 (×2): 2 [IU] via SUBCUTANEOUS
  Administered 2019-02-28: 4 [IU] via SUBCUTANEOUS
  Administered 2019-03-01: 2 [IU] via SUBCUTANEOUS

## 2019-02-22 MED ORDER — MIDAZOLAM HCL 2 MG/2ML IJ SOLN
INTRAMUSCULAR | Status: AC
  Administered 2019-02-22: 10:00:00
  Filled 2019-02-22: qty 6

## 2019-02-22 MED ORDER — MIDAZOLAM HCL 2 MG/2ML IJ SOLN
4.0000 mg | Freq: Once | INTRAMUSCULAR | Status: AC
  Administered 2019-02-22: 4 mg via INTRAVENOUS
  Filled 2019-02-22: qty 4

## 2019-02-22 MED ORDER — LIDOCAINE HCL (PF) 1 % IJ SOLN
INTRAMUSCULAR | Status: AC | PRN
  Administered 2019-02-22 (×2): 5 mL

## 2019-02-22 MED ORDER — SODIUM CHLORIDE 0.9 % IV SOLN
2.0000 g | Freq: Two times a day (BID) | INTRAVENOUS | Status: DC
  Administered 2019-02-22 – 2019-02-23 (×3): 2 g via INTRAVENOUS
  Filled 2019-02-22 (×3): qty 2

## 2019-02-22 MED ORDER — FUROSEMIDE 10 MG/ML IJ SOLN: 40.0000 mg | Freq: Three times a day (TID) | INTRAMUSCULAR | Status: DC

## 2019-02-22 MED ORDER — VITAL HIGH PROTEIN PO LIQD
1000.0000 mL | ORAL | Status: AC
  Administered 2019-02-22: 1000 mL

## 2019-02-22 MED ORDER — IPRATROPIUM BROMIDE 0.02 % IN SOLN
0.5000 mg | Freq: Four times a day (QID) | RESPIRATORY_TRACT | Status: DC
  Administered 2019-02-22 – 2019-02-27 (×21): 0.5 mg via RESPIRATORY_TRACT
  Filled 2019-02-22 (×19): qty 2.5

## 2019-02-22 MED ORDER — FENTANYL CITRATE (PF) 100 MCG/2ML IJ SOLN
INTRAMUSCULAR | Status: AC
  Administered 2019-02-22: 50 ug via INTRAVENOUS
  Filled 2019-02-22: qty 2

## 2019-02-22 MED ORDER — FUROSEMIDE 10 MG/ML IJ SOLN
20.0000 mg | Freq: Once | INTRAMUSCULAR | Status: AC
  Administered 2019-02-22: 20 mg via INTRAVENOUS

## 2019-02-22 MED ORDER — HYDROMORPHONE HCL 1 MG/ML IJ SOLN: 1.0000 mg | INTRAMUSCULAR | Status: DC | PRN

## 2019-02-22 MED ORDER — FENTANYL 2500MCG IN NS 250ML (10MCG/ML) PREMIX INFUSION
50.0000 ug/h | INTRAVENOUS | Status: DC
  Administered 2019-02-22: 175 ug/h via INTRAVENOUS
  Administered 2019-02-24: 20:00:00 150 ug/h via INTRAVENOUS
  Administered 2019-02-24: 02:00:00 175 ug/h via INTRAVENOUS
  Filled 2019-02-22 (×4): qty 250

## 2019-02-22 MED ORDER — ACETAMINOPHEN 325 MG PO TABS
650.0000 mg | ORAL_TABLET | Freq: Four times a day (QID) | ORAL | Status: DC | PRN
  Administered 2019-02-23 – 2019-04-02 (×11): 650 mg via ORAL
  Filled 2019-02-22 (×2): qty 2

## 2019-02-22 MED ORDER — FENTANYL CITRATE (PF) 100 MCG/2ML IJ SOLN
INTRAMUSCULAR | Status: AC | PRN
  Administered 2019-02-22 (×2): 50 ug via INTRAVENOUS
  Administered 2019-02-22: 10:00:00 via INTRAVENOUS

## 2019-02-22 MED ORDER — VANCOMYCIN HCL IN DEXTROSE 1-5 GM/200ML-% IV SOLN
1000.0000 mg | INTRAVENOUS | Status: DC
  Administered 2019-02-22: 1000 mg via INTRAVENOUS
  Filled 2019-02-22: qty 200

## 2019-02-22 MED ORDER — ONDANSETRON HCL 4 MG/2ML IJ SOLN
4.0000 mg | Freq: Four times a day (QID) | INTRAMUSCULAR | Status: DC | PRN
  Administered 2019-03-11: 4 mg via INTRAVENOUS
  Filled 2019-02-22: qty 2

## 2019-02-22 MED ORDER — HEPARIN SODIUM (PORCINE) 5000 UNIT/ML IJ SOLN
5000.0000 [IU] | Freq: Three times a day (TID) | INTRAMUSCULAR | Status: DC
  Administered 2019-02-23 – 2019-02-28 (×17): 5000 [IU] via SUBCUTANEOUS
  Filled 2019-02-22 (×17): qty 1

## 2019-02-22 MED ORDER — ENOXAPARIN SODIUM 40 MG/0.4ML ~~LOC~~ SOLN: 40.0000 mg | SUBCUTANEOUS | Status: DC

## 2019-02-22 MED ORDER — FENTANYL CITRATE (PF) 100 MCG/2ML IJ SOLN
INTRAMUSCULAR | Status: AC
  Administered 2019-02-22: 10:00:00 via INTRAVENOUS
  Filled 2019-02-22: qty 4

## 2019-02-22 MED ORDER — ACETAMINOPHEN 650 MG RE SUPP: 650.0000 mg | Freq: Four times a day (QID) | RECTAL | Status: DC | PRN

## 2019-02-22 MED ORDER — PRO-STAT SUGAR FREE PO LIQD
30.0000 mL | Freq: Two times a day (BID) | ORAL | Status: DC
  Administered 2019-02-22 – 2019-02-23 (×2): 30 mL
  Filled 2019-02-22 (×2): qty 30

## 2019-02-22 MED ORDER — LACTATED RINGERS IV BOLUS
1000.0000 mL | Freq: Once | INTRAVENOUS | Status: AC
  Administered 2019-02-22: 1000 mL via INTRAVENOUS

## 2019-02-22 MED ORDER — LORAZEPAM 2 MG/ML IJ SOLN
2.0000 mg | INTRAMUSCULAR | Status: DC | PRN
  Administered 2019-02-22 – 2019-02-28 (×3): 2 mg via INTRAVENOUS
  Filled 2019-02-22 (×3): qty 1

## 2019-02-22 MED ORDER — MAGNESIUM SULFATE 2 GM/50ML IV SOLN
2.0000 g | Freq: Once | INTRAVENOUS | Status: AC
  Administered 2019-02-22: 2 g via INTRAVENOUS
  Filled 2019-02-22: qty 50

## 2019-02-22 MED ORDER — ORAL CARE MOUTH RINSE
15.0000 mL | Freq: Two times a day (BID) | OROMUCOSAL | Status: DC
  Administered 2019-02-22: 15 mL via OROMUCOSAL

## 2019-02-22 MED ORDER — CHLORHEXIDINE GLUCONATE CLOTH 2 % EX PADS
6.0000 | MEDICATED_PAD | Freq: Every day | CUTANEOUS | Status: DC
  Administered 2019-02-23 – 2019-04-04 (×32): 6 via TOPICAL

## 2019-02-22 MED ORDER — CHLORHEXIDINE GLUCONATE 0.12 % MT SOLN
15.0000 mL | Freq: Two times a day (BID) | OROMUCOSAL | Status: DC
  Administered 2019-02-22 (×2): 15 mL via OROMUCOSAL

## 2019-02-22 MED ORDER — IPRATROPIUM-ALBUTEROL 0.5-2.5 (3) MG/3ML IN SOLN
3.0000 mL | RESPIRATORY_TRACT | Status: DC | PRN
  Administered 2019-03-03: 3 mL via RESPIRATORY_TRACT
  Filled 2019-02-22: qty 3

## 2019-02-22 MED ORDER — MIDAZOLAM HCL 2 MG/2ML IJ SOLN
INTRAMUSCULAR | Status: AC
  Administered 2019-02-22: 2 mg via INTRAVENOUS
  Filled 2019-02-22: qty 4

## 2019-02-22 MED ORDER — SODIUM CHLORIDE 0.9 % IV SOLN
INTRAVENOUS | Status: DC
  Administered 2019-02-22: 08:00:00 via INTRAVENOUS

## 2019-02-22 MED ORDER — PANTOPRAZOLE SODIUM 40 MG PO PACK
40.0000 mg | PACK | Freq: Every day | ORAL | Status: DC
  Administered 2019-02-23 – 2019-02-24 (×2): 40 mg
  Filled 2019-02-22 (×2): qty 20

## 2019-02-22 MED ORDER — DIPHENHYDRAMINE HCL 50 MG/ML IJ SOLN
INTRAMUSCULAR | Status: AC
  Administered 2019-02-22: 10:00:00
  Filled 2019-02-22: qty 1

## 2019-02-22 MED ORDER — VANCOMYCIN HCL IN DEXTROSE 750-5 MG/150ML-% IV SOLN: 750.0000 mg | INTRAVENOUS | Status: DC

## 2019-02-22 NOTE — Progress Notes (Signed)
Pharmacy Antibiotic Note  Franklin Anderson is a 48 y.o. male with hx heroin abuse presented to the ED on 02/21/2019 with c/o lower back pain, LE weakness and weight loss. Thoracic/lumbar spine MRI showed discitis osteomyelitis, septic lung emboli, and bilateral psoas muscle abscesses. Pharmacy is consulted to dose vancomycin and cefepime for infection.  - 10/27 CXR: Apparent pneumonia left base with small left pleural effusion  - 10/27 thoracic/lumbar spine MRI: Abnormal enhancement of the L4 and L5 vertebral bodies and disc space, consistent with discitis osteomyelitis; Bilateral psoas muscle abscesses; Abnormal signal and enhancement of the T1 vertebral body with surrounding paraspinal soft tissue enhancement, possibly indicating osteomyelitis. Multiple cavitary lesions within both lungs, consistent with septic emboli.  - Tmax 100, wbc elevated but down - scr down 1.48 (crcl~55)   Plan: - vancomycin 1000 mg IV q24h for est AUC 445 - cefepime 2gm IV q12h - f/u renal function _______________________________________  Height: 6' (182.9 cm) Weight: 140 lb (63.5 kg) IBW/kg (Calculated) : 77.6  Temp (24hrs), Avg:100 F (37.8 C), Min:100 F (37.8 C), Max:100 F (37.8 C)  Recent Labs  Lab 02/21/19 2013 02/21/19 2213 02/22/19 0533  WBC 22.4*  --  20.3*  CREATININE 1.77*  --  1.48*  LATICACIDVEN 2.1* 1.1  --     Estimated Creatinine Clearance: 54.8 mL/min (A) (by C-G formula based on SCr of 1.48 mg/dL (H)).    Allergies  Allergen Reactions  . Penicillins Other (See Comments)    Thank you for allowing pharmacy to be a part of this patient's care.  Lynelle Doctor 02/22/2019 8:14 AM

## 2019-02-22 NOTE — ED Notes (Signed)
IV team unable to establish IV access.

## 2019-02-22 NOTE — Procedures (Signed)
Interventional Radiology Procedure Note  Procedure: CT guided aspiration of right psoas abscesses yielding 18 mL purulent fluid.  Samples sent for culture.   Complications: None  Estimated Blood Loss: None  Recommendations: - Cultures pending  Signed,  Criselda Peaches, MD

## 2019-02-22 NOTE — ED Notes (Signed)
Ultrasound at bedside

## 2019-02-22 NOTE — Progress Notes (Addendum)
Patient more dyspneic this afternoon with diffuse abnormal breath sounds.  Chest x-ray suggestive of possibly worsening infiltrate/pneumonia.  Septic emboli seen on the x-ray.  Seen and examined at the bedside.  In the morning he was easily arousable to his name but this afternoon having little difficulty and falls asleep easily. Vital signs shows afebrile, tachypnea and tachycardia.  Blood pressure elevated 177L systolic range. Physical exam he is alert to his name but does not carry on a conversation.  Diffuse rhonchorous breath sounds.  Cachectic frail-appearing.  Sinus tachycardia.  Chest x-ray reviewed. MRI of the brain reviewed-overall negative for infarct may be patchy meningeal enhancement. Echocardiogram reviewed-EF 39%, grade 1 diastolic dysfunction with questionable vegetation.  Fluids have been stopped, ABG shows respiratory alkalosis with hypercarbia secondary to tachypnea.  Briefly placed on BiPAP.  Lasix IV given, scheduled 2 more doses.  Pulmonary critical care consulted as a suspect patient will need intubation, appreciate their input.  Spoke with Dr. Johnnye Sima regarding the MRI finding-at this time we will continue vancomycin and cefepime.  Maintain n.p.o. status for TEE.  Inboxed to cardiology team so it can be arranged.  Discussed with RN.  Call with questions as needed.
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
## 2019-02-22 NOTE — Progress Notes (Signed)
Results for PAPE, PARSON (MRN [id number]) as of 02/22/2019 22:04  Ref. Range 02/22/2019 21:33  FIO2 Unknown 100.00  pH, Arterial Latest Ref Range: 7.350 - 7.450  7.368  pCO2 arterial Latest Ref Range: 32.0 - 48.0 mmHg 42.7  pO2, Arterial Latest Ref Range: 83.0 - 108.0 mmHg 302 (H)  Acid-base deficit Latest Ref Range: 0.0 - 2.0 mmol/L 0.7  Bicarbonate Latest Ref Range: 20.0 - 28.0 mmol/L 24.0  O2 Saturation Latest Units: % 99.5  Patient temperature Unknown 98.6  Allens test   (pass/fail) Latest Ref Range: PASS  PASS    Abg drawn on the following vent settings: VT=450, rr16, 100% fio2, +5peep.

## 2019-02-22 NOTE — Progress Notes (Signed)
  Echocardiogram 2D Echocardiogram has been performed.  Franklin Anderson 02/22/2019, 4:01 PM

## 2019-02-22 NOTE — Progress Notes (Signed)
  Echocardiogram 2D Echocardiogram has been attempted. Patient leaving. Will reattempt at later time.  Franklin Anderson G Gonzalo Waymire 02/22/2019, 8:58 AM

## 2019-02-22 NOTE — Progress Notes (Signed)
MD notified for worsening tachypnea and O2 sats 90-93% on room air. Patient remains confused, only alert to self, rhonchus in upper lobes, diminished in the bases. Breathing approximately 40 times a minute with HR around 120. Instructed to stop IV fluids for now, administer 20 mg IV lasix and monitor over the next hour for improvement. Will continue to closely monitor at this time.
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
## 2019-02-22 NOTE — ED Notes (Signed)
IV team at bedside 

## 2019-02-22 NOTE — Progress Notes (Signed)
eLink Physician-Brief Progress Note Patient Name: Franklin Anderson DOB: 1970/11/18 MRN: [id number]   Date of Service  02/22/2019  HPI/Events of Note  Reviewed imaging  CXR and KUB for line placement.  No evidence of pneumothorax.  OGT terminates within the gastric bubble.   eICU Interventions  Ok to use central line and oGT.     Intervention Category Intermediate Interventions: Diagnostic test evaluation  Elsie Lincoln 02/22/2019, 11:00 PM

## 2019-02-22 NOTE — Progress Notes (Signed)
Initial Nutrition Assessment  RD working remotely.   DOCUMENTATION CODES:   (unable to assess for malnutrition at this time.)  INTERVENTION:  - diet advancement as medically feasible.    NUTRITION DIAGNOSIS:   Inadequate oral intake related to inability to eat as evidenced by NPO status.  GOAL:   Patient will meet greater than or equal to 90% of their needs  MONITOR:   Diet advancement, Labs, Weight trends, I & O's  REASON FOR ASSESSMENT:   Malnutrition Screening Tool  ASSESSMENT:   48 y.o. male with medical history of IVDU, ongoing heroin use. Patient presented to the ED on 10/27 with 3 week history of progressively worsening back pain and BLE weakness.  Denies any urinary retention but has noticed some bowel incontinence. Stated that he has had increasing trouble walking and that he has lost weight. Last use of heroin was today (10/27).  Patient has been NPO since admission. Patient is currently out of the room to CT. Per chart review, current weight is 140 lb and PTA, the most recently documented weight was on 12/21/17 when he weighed 144 lb. This indicates 4 lb weight loss in the past 14 months; not significant for time frame but unsure if weight loss has occurred more acutely.   Per notes: - osteomyelitis and diskitis of L4-L5 - septic pulmonary emboli - bilateral psoas abscesses--s/p drainage of R psoas by IR with this AM with 18 ml fluid drained - AKI - sepsis - beginning to have some degree of AMS   Labs reviewed; Na: 131 mmol/l, Cl: 94 mmol/l, BUN: 118 mg/dl, creatinine: 1.48 mg/dl, Ca: 8.1 mg/dl, Alk Phos elevated, LFTs elevated, GFR: 55 ml/min. Medications reviewed. IVF; NS @ 125 ml/hr.    NUTRITION - FOCUSED PHYSICAL EXAM:  unable to complete at this time.   Diet Order:   Diet Order            Diet NPO time specified  Diet effective now              EDUCATION NEEDS:   No education needs have been identified at this time  Skin:  Skin  Assessment: Skin Integrity Issues: Skin Integrity Issues:: Stage I Stage I: sacrum  Last BM:  PTA/unknown  Height:   Ht Readings from Last 1 Encounters:  02/22/19 6' (1.829 m)    Weight:   Wt Readings from Last 1 Encounters:  02/21/19 63.5 kg    Ideal Body Weight:  80.9 kg  BMI:  Body mass index is 18.99 kg/m.  Estimated Nutritional Needs:   Kcal:  9373-4287 kcal  Protein:  95-110 grams  Fluid:  >/= 2 L/day     Jarome Matin, MS, RD, LDN, [facility] Inpatient Clinical Dietitian Pager # [phone] After hours/weekend pager # [phone]

## 2019-02-22 NOTE — Progress Notes (Signed)
MEDICATION-RELATED CONSULT NOTE   IR Procedure Consult - Anticoagulant/Antiplatelet PTA/Inpatient Med List Review by Pharmacist    Procedure:  CT guided aspiration of right psoas abscesses     Completed: 02/22/19 at 10 AM  Post-Procedural bleeding risk per IR MD assessment:  standard  Antithrombotic medications on inpatient or PTA profile prior to procedure:    - heparin 5000 units SQ q8h (currently scheduled to start on 10/29 at 6AM)  Recommended restart time per IR Post-Procedure Guidelines:   - resume prophylactic LMWH at least 6 hrs after procedure or at the next standard dose interval)   Plan:     - heparin 5000 units SQ q8h to start on 10/29 at 0600 as ordered by MD - pharmacy will sign off. Re-consult [place] if need further assistance  Dia Sitter, PharmD, BCPS 02/22/2019 12:49 PM

## 2019-02-22 NOTE — ED Notes (Signed)
Shanon Brow (step brother) : cell phone [phone]
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
## 2019-02-22 NOTE — Procedures (Signed)
Intubation Note Emergent consent Patient preoxygenated, given fentanyl/versed/etomidate/rocuronium and intubated with 8-0 ETT without issue Good color change on qualitative capnograph Post procedure no chest rise noted on left, see subsequent bronch note CXR pending  Erskine Emery MD

## 2019-02-22 NOTE — ED Provider Notes (Signed)
I assumed care of this patient from Dr. Zenia Resides.  Please see their note for further details of Hx, PE.  Briefly patient is a 48 y.o. male who presented LBP. H/o IVDU. Noted to be tachy and febrile. Work thus far notable for leukocytosis, AKI and pneumonia.  Pending MRI to assess for epidural abscess.  MRI notable for multiple areas of possible discitis/osteomyelitis.  Also notable for bilateral psoas abscesses.  Multiple pulmonary cavitary lesions concerning for septic emboli.  Patient already received antibiotics.  Will be admitted for further management.  CRITICAL CARE Performed by: Grayce Sessions Cardama Total critical care time: 15 minutes Critical care time was exclusive of separately billable procedures and treating other patients. Critical care was necessary to treat or prevent imminent or life-threatening deterioration. Critical care was time spent personally by me on the following activities: development of treatment plan with patient and/or surrogate as well as nursing, discussions with consultants, evaluation of patient's response to treatment, examination of patient, obtaining history from patient or surrogate, ordering and performing treatments and interventions, ordering and review of laboratory studies, ordering and review of radiographic studies, pulse oximetry and re-evaluation of patient's condition.       Fatima Blank, MD 02/22/19 [phone]

## 2019-02-22 NOTE — Consult Note (Signed)
[facility]    Date of Admission:  02/21/2019   Total days of antibiotics: 1 vanco/cefepime               Reason for Consult: Psoas abscess, IVDA    Referring Provider: TRH   Assessment: IVDA Psoas abscess Septic pulmonary emboli AKI Previously Hep C Ab+  Plan: 1. Await BCx (10-27 and 10-28) 2. Await psoas abscess aspirate Cx.  3. Continue on vanco/cefepime for now.  4. Needs TEE. TTE done today is pending 5. If possible get him back on methadone (prev on in 2019) when respiratory stable.  6. Repeat HIV and Hep C RNA. RPR, GC, Chlamydia.   Thank you so much for this interesting consult,  Principal Problem:   Sepsis (HCC) Active Problems:   Heroin use   Endocarditis   Septic pulmonary embolism (HCC)   Osteomyelitis of thoracic spine (HCC)   Osteomyelitis of lumbar spine (HCC)   Acute encephalopathy   AKI (acute kidney injury) (HCC)   Transaminitis   Psoas muscle abscess (HCC)   Pressure injury of skin    chlorhexidine  15 mL Mouth Rinse BID   Chlorhexidine Gluconate Cloth  6 each Topical Daily   [START ON 02/23/2019] heparin injection (subcutaneous)  5,000 Units Subcutaneous Q8H   mouth rinse  15 mL Mouth Rinse q12n4p    HPI: Franklin Anderson is a 48 y.o. male with hx of IVDA (heroin used day of adm), adm on 10-27 with 3 weeks of worsening back pain and LE weakness. He denied f/c.  He was found to have WBC 22.4 and Cr 1.77 (prev normal). His MRI showed psoas abscesses, osteo or L4-5 and T1, and septic pulmonary emboli,  Today he has undergone CT guided aspirate of his abscesses.   Review of Systems: Review of Systems  Unable to perform ROS: Acuity of condition  Pt currently on bipap.   Past Medical History:  Diagnosis Date   Drug abuse (HCC)    H/O: rheumatic fever    Diagnosed at 48 y/o.  Resolved at 48 y/o.  States he was diagnosed with heart murmur felt due to this   Hypertension 2012    Social History     Tobacco Use   Smoking status: Former Smoker    Packs/day: 1.50    Years: 16.00    Pack years: 24.00    Types: Cigarettes    Quit date: 04/27/2017    Years since quitting: 1.8   Smokeless tobacco: Current User  Substance Use Topics   Alcohol use: No    Alcohol/week: 0.0 standard drinks    Comment: Clean since Sep 08, 2008   Drug use: Yes    Types: IV    Comment: Heroin--used 11/2017    Family History  Problem Relation Age of Onset   Cancer Father        Prostate cancer--cause of death   Coronary artery disease Father        6 CABG after MI   COPD Mother    Lymphoma Mother 23       Unknown type   Hypertension Mother    Aneurysm Brother 86       Brain-cause of death   Hypertension Brother      Medications:  Scheduled:  chlorhexidine  15 mL Mouth Rinse BID   Chlorhexidine Gluconate Cloth  6 each Topical Daily   furosemide  40 mg Intravenous Q8H   [  START ON 02/23/2019] heparin injection (subcutaneous)  5,000 Units Subcutaneous Q8H   ipratropium  0.5 mg Nebulization Q6H   levalbuterol  1.25 mg Nebulization Q6H   mouth rinse  15 mL Mouth Rinse q12n4p    Abtx:  Anti-infectives (From admission, onward)   Start     Dose/Rate Route Frequency Ordered Stop   02/22/19 2200  vancomycin (VANCOCIN) IVPB 750 mg/150 ml premix  Status:  Discontinued     750 mg 150 mL/hr over 60 Minutes Intravenous Every 24 hours 02/22/19 0826 02/22/19 0827   02/22/19 2200  vancomycin (VANCOCIN) IVPB 1000 mg/200 mL premix     1,000 mg 200 mL/hr over 60 Minutes Intravenous Every 24 hours 02/22/19 0827     02/22/19 0900  ceFEPIme (MAXIPIME) 2 g in sodium chloride 0.9 % 100 mL IVPB     2 g 200 mL/hr over 30 Minutes Intravenous Every 12 hours 02/22/19 0826     02/21/19 2015  ceFEPIme (MAXIPIME) 2 g in sodium chloride 0.9 % 100 mL IVPB     2 g 200 mL/hr over 30 Minutes Intravenous  Once 02/21/19 2013 02/22/19 0031   02/21/19 2015  metroNIDAZOLE (FLAGYL) IVPB 500 mg     500  mg 100 mL/hr over 60 Minutes Intravenous  Once 02/21/19 2013 02/22/19 0031   02/21/19 2015  vancomycin (VANCOCIN) IVPB 1000 mg/200 mL premix     1,000 mg 200 mL/hr over 60 Minutes Intravenous  Once 02/21/19 2013 02/22/19 0200        OBJECTIVE: Blood pressure (!) 130/94, pulse (!) 109, temperature 98.5 F (36.9 C), temperature source Axillary, resp. rate (!) 27, height 6' (1.829 m), weight 63.5 kg, SpO2 100 %.  Physical Exam Constitutional:      Appearance: He is cachectic. He is ill-appearing. He is not diaphoretic.  Eyes:     Pupils: Pupils are equal, round, and reactive to light.  Neck:     Musculoskeletal: Neck supple.  Cardiovascular:     Rate and Rhythm: Tachycardia present.  Pulmonary:     Effort: Tachypnea and accessory muscle usage present.     Breath sounds: Rhonchi present.  Abdominal:     General: Bowel sounds are normal.     Palpations: Abdomen is soft.  Musculoskeletal:       Arms:     Right lower leg: Edema present.     Left lower leg: Edema present.  Lymphadenopathy:     Cervical: No cervical adenopathy.     Lab Results Results for orders placed or performed during the hospital encounter of 02/21/19 (from the past 48 hour(s))  Lactic acid, plasma     Status: Abnormal   Collection Time: 02/21/19  8:13 PM  Result Value Ref Range   Lactic Acid, Venous 2.1 (HH) 0.5 - 1.9 mmol/L    Comment: CRITICAL RESULT CALLED TO, READ BACK BY AND VERIFIED WITH: J.OXENDINE AT 2151 ON 02/21/19 BY N.THOMPSON Performed at [facility], [address] [place], [place] [address]   Comprehensive metabolic panel     Status: Abnormal   Collection Time: 02/21/19  8:13 PM  Result Value Ref Range   Sodium 128 (L) 135 - 145 mmol/L   Potassium 4.3 3.5 - 5.1 mmol/L   Chloride 89 (L) 98 - 111 mmol/L   CO2 24 22 - 32 mmol/L   Glucose, Bld 148 (H) 70 - 99 mg/dL   BUN 604 (H) 6 - 20 mg/dL    Comment: RESULTS CONFIRMED BY MANUAL DILUTION  Creatinine, Ser 1.77  (H) 0.61 - 1.24 mg/dL   Calcium 8.2 (L) 8.9 - 10.3 mg/dL   Total Protein 6.9 6.5 - 8.1 g/dL   Albumin 1.9 (L) 3.5 - 5.0 g/dL   AST 80 (H) 15 - 41 U/L   ALT 73 (H) 0 - 44 U/L   Alkaline Phosphatase 158 (H) 38 - 126 U/L   Total Bilirubin 1.7 (H) 0.3 - 1.2 mg/dL   GFR calc non Af Amer 44 (L) >60 mL/min   GFR calc Af Amer 51 (L) >60 mL/min   Anion gap 15 5 - 15    Comment: Performed at [facility], [address], [place], [place] [address]  CBC WITH DIFFERENTIAL     Status: Abnormal   Collection Time: 02/21/19  8:13 PM  Result Value Ref Range   WBC 22.4 (H) 4.0 - 10.5 K/uL   RBC 3.74 (L) 4.22 - 5.81 MIL/uL   Hemoglobin 10.6 (L) 13.0 - 17.0 g/dL   HCT 22.9 (L) 79.8 - 92.1 %   MCV 85.8 80.0 - 100.0 fL   MCH 28.3 26.0 - 34.0 pg   MCHC 33.0 30.0 - 36.0 g/dL   RDW 19.4 17.4 - 08.1 %   Platelets 263 150 - 400 K/uL   nRBC 0.0 0.0 - 0.2 %   Neutrophils Relative % 92 %    Comment: TOXIC GRANULATION VACUOLATED NEUTROPHILS    Neutro Abs 20.6 (H) 1.7 - 7.7 K/uL   Lymphocytes Relative 4 %   Lymphs Abs 0.8 0.7 - 4.0 K/uL   Monocytes Relative 3 %   Monocytes Absolute 0.6 0.1 - 1.0 K/uL   Eosinophils Relative 0 %   Eosinophils Absolute 0.0 0.0 - 0.5 K/uL   Basophils Relative 0 %   Basophils Absolute 0.1 0.0 - 0.1 K/uL   WBC Morphology MILD LEFT SHIFT (1-5% METAS, OCC MYELO, OCC BANDS)     Comment: RARE BANDS PRESENT   Immature Granulocytes 1 %   Abs Immature Granulocytes 0.19 (H) 0.00 - 0.07 K/uL    Comment: Performed at [facility], [address] [place], [place] [address]  APTT     Status: None   Collection Time: 02/21/19  8:13 PM  Result Value Ref Range   aPTT 24 24 - 36 seconds    Comment: Performed at [facility], [address] [place], [place] [address]  Protime-INR     Status: Abnormal   Collection Time: 02/21/19  8:13 PM  Result Value Ref Range   Prothrombin Time 17.2 (H) 11.4 - 15.2 seconds   INR 1.4 (H) 0.8 -  1.2    Comment: (NOTE) INR goal varies based on device and disease states. Performed at [facility], [address] [place], [place] [address]   SARS Coronavirus 2 by RT PCR (hospital order, performed in [facility] hospital lab) Nasopharyngeal Nasopharyngeal Swab     Status: None   Collection Time: 02/21/19  8:16 PM   Specimen: Nasopharyngeal Swab  Result Value Ref Range   SARS Coronavirus 2 NEGATIVE NEGATIVE    Comment: (NOTE) If result is NEGATIVE SARS-CoV-2 target nucleic acids are NOT DETECTED. The SARS-CoV-2 RNA is generally detectable in upper and lower  respiratory specimens during the acute phase of infection. The lowest  concentration of SARS-CoV-2 viral copies this assay can detect is 250  copies / mL. A negative result does not preclude SARS-CoV-2 infection  and should not be used as the  sole basis for treatment or other  patient management decisions.  A negative result may occur with  improper specimen collection / handling, submission of specimen other  than nasopharyngeal swab, presence of viral mutation(s) within the  areas targeted by this assay, and inadequate number of viral copies  (<250 copies / mL). A negative result must be combined with clinical  observations, patient history, and epidemiological information. If result is POSITIVE SARS-CoV-2 target nucleic acids are DETECTED. The SARS-CoV-2 RNA is generally detectable in upper and lower  respiratory specimens dur ing the acute phase of infection.  Positive  results are indicative of active infection with SARS-CoV-2.  Clinical  correlation with patient history and other diagnostic information is  necessary to determine patient infection status.  Positive results do  not rule out bacterial infection or co-infection with other viruses. If result is PRESUMPTIVE POSTIVE SARS-CoV-2 nucleic acids MAY BE PRESENT.   A presumptive positive result was obtained on the submitted specimen  and  confirmed on repeat testing.  While 2019 novel coronavirus  (SARS-CoV-2) nucleic acids may be present in the submitted sample  additional confirmatory testing may be necessary for epidemiological  and / or clinical management purposes  to differentiate between  SARS-CoV-2 and other Sarbecovirus currently known to infect humans.  If clinically indicated additional testing with an alternate test  methodology [phone]) is advised. The SARS-CoV-2 RNA is generally  detectable in upper and lower respiratory sp ecimens during the acute  phase of infection. The expected result is Negative. Fact Sheet for Patients:  BoilerBrush.com.cy Fact Sheet for Healthcare Providers: https://pope.com/ This test is not yet approved or cleared by the [place] FDA and has been authorized for detection and/or diagnosis of SARS-CoV-2 by FDA under an Emergency Use Authorization (EUA).  This EUA will remain in effect (meaning this test can be used) for the duration of the COVID-19 declaration under Section 564(b)(1) of the Act, 21 U.S.C. section 360bbb-3(b)(1), unless the authorization is terminated or revoked sooner. Performed at [facility], [address] [place], [place] [address]   Lactic acid, plasma     Status: None   Collection Time: 02/21/19 10:13 PM  Result Value Ref Range   Lactic Acid, Venous 1.1 0.5 - 1.9 mmol/L    Comment: Performed at [facility], [address] [place], [place] [address]  MRSA PCR Screening     Status: None   Collection Time: 02/22/19  4:56 AM   Specimen: Nasal Mucosa; Nasopharyngeal  Result Value Ref Range   MRSA by PCR NEGATIVE NEGATIVE    Comment:        The GeneXpert MRSA Assay (FDA approved for NASAL specimens only), is one component of a comprehensive MRSA colonization surveillance program. It is not intended to diagnose MRSA infection nor to guide or monitor treatment  for MRSA infections. Performed at [facility], [address], [place], [place] [address]   CBC     Status: Abnormal   Collection Time: 02/22/19  5:33 AM  Result Value Ref Range   WBC 20.3 (H) 4.0 - 10.5 K/uL   RBC 3.67 (L) 4.22 - 5.81 MIL/uL   Hemoglobin 10.3 (L) 13.0 - 17.0 g/dL   HCT 56.2 (L) 13.0 - 86.5 %   MCV 85.8 80.0 - 100.0 fL   MCH 28.1 26.0 - 34.0 pg   MCHC 32.7 30.0 - 36.0 g/dL   RDW 78.4 69.6 - 29.5 %   Platelets 266 150 - 400  K/uL   nRBC 0.0 0.0 - 0.2 %    Comment: Performed at [facility], [address] [place], [place] [address]  Comprehensive metabolic panel     Status: Abnormal   Collection Time: 02/22/19  5:33 AM  Result Value Ref Range   Sodium 131 (L) 135 - 145 mmol/L   Potassium 3.7 3.5 - 5.1 mmol/L   Chloride 94 (L) 98 - 111 mmol/L   CO2 23 22 - 32 mmol/L   Glucose, Bld 116 (H) 70 - 99 mg/dL   BUN 981 (H) 6 - 20 mg/dL    Comment: RESULTS CONFIRMED BY MANUAL DILUTION   Creatinine, Ser 1.48 (H) 0.61 - 1.24 mg/dL   Calcium 8.1 (L) 8.9 - 10.3 mg/dL   Total Protein 6.4 (L) 6.5 - 8.1 g/dL   Albumin 1.7 (L) 3.5 - 5.0 g/dL   AST 72 (H) 15 - 41 U/L   ALT 68 (H) 0 - 44 U/L   Alkaline Phosphatase 137 (H) 38 - 126 U/L   Total Bilirubin 1.8 (H) 0.3 - 1.2 mg/dL   GFR calc non Af Amer 55 (L) >60 mL/min   GFR calc Af Amer >60 >60 mL/min   Anion gap 14 5 - 15    Comment: Performed at [facility], [address], [place], [place] [address]   No results found for: SDES, SPECREQUEST, CULT, REPTSTATUS Dg Skull 1-3 Views  Result Date: 02/21/2019 CLINICAL DATA:  48 year old male with MRI clearance. EXAM: SKULL - 1-3 VIEW COMPARISON:  None. FINDINGS: There is no evidence of skull fracture or other focal bone lesions. There is opacification of the right frontal sinus. Nasal and tongue piercings noted. No other radiopaque foreign object identified. IMPRESSION: 1. Nose ring and tongue piercing. No other  radiopaque/metallic foreign object. 2. No evidence of skull fracture or other focal bone lesions. 3. Opacification of the right frontal sinus. . Electronically Signed   By: Elgie Collard M.D.   On: 02/21/2019 22:21   Dg Abd 1 View  Result Date: 02/21/2019 CLINICAL DATA:  Back pain, progressive neuro deficit, MR clearance EXAM: ABDOMEN - 1 VIEW COMPARISON:  None. FINDINGS: Cardiac monitoring leads overlie the pelvis. No surgical hardware or metallic foreign body is identified. There is a paucity of upper abdominal bowel gas, nonspecific. Patchy opacities are present in the left lung base with likely trace left effusion. Degenerative changes in the spine and hips. IMPRESSION: 1. No surgical hardware or metallic foreign body is identified. 2. Patchy opacities in the left lung base, suspicious for pneumonia. Trace left effusion. 3. Nonspecific paucity of upper abdominal bowel gas. Electronically Signed   By: Kreg Shropshire M.D.   On: 02/21/2019 22:18   Ct Head Wo Contrast  Result Date: 02/22/2019 CLINICAL DATA:  Encephalopathy EXAM: CT HEAD WITHOUT CONTRAST TECHNIQUE: Contiguous axial images were obtained from the base of the skull through the vertex without intravenous contrast. COMPARISON:  None. FINDINGS: Brain: There is no mass, hemorrhage or extra-axial collection. The size and configuration of the ventricles and extra-axial CSF spaces are normal. The brain parenchyma is normal, without acute or chronic infarction. Vascular: No abnormal hyperdensity of the major intracranial arteries or dural venous sinuses. No intracranial atherosclerosis. Skull: The visualized skull base, calvarium and extracranial soft tissues are normal. Sinuses/Orbits: No fluid levels or advanced mucosal thickening of the visualized paranasal sinuses. No mastoid or middle ear effusion. The orbits are normal. IMPRESSION: Normal head CT. Electronically Signed   By: Caryn Bee  Collins [place] M.D.   On: 02/22/2019 02:36   Mr Thoracic Spine W Wo  Contrast  Result Date: 02/21/2019 CLINICAL DATA:  Back pain with weakness. History of IV drug use. EXAM: MRI THORACIC AND LUMBAR SPINE WITHOUT AND WITH CONTRAST TECHNIQUE: Multiplanar and multiecho pulse sequences of the thoracic and lumbar spine were obtained without and with intravenous contrast. CONTRAST:  55mL GADAVIST GADOBUTROL 1 MMOL/ML IV SOLN COMPARISON:  None. FINDINGS: MRI THORACIC SPINE FINDINGS Alignment:  Physiologic. Vertebrae: Hyperintense T2-weighted signal and contrast enhancement within the T1 vertebrae. Cord:  Normal Paraspinal and other soft tissues: There are multiple cavitary lesions within the left lung. There is a large cavitary lesion of the right lung apex. Mild contrast enhancement of the paraspinal tissues at T1. Disc levels: Small central disc protrusion at T8-9 with no spinal canal stenosis. MRI LUMBAR SPINE FINDINGS Segmentation:  Standard. Alignment:  Normal Vertebrae: There is hyperintense T2-weighted signal throughout the bone marrow of the L4 and L5 vertebral bodies, extending to the disc space. There is abnormal contrast enhancement of both vertebra. Bone marrow signal is otherwise normal throughout the lumbar spine. There is fluid within both L4-5 facet joints. Conus medullaris: Extends to the L2 level and appears normal. Paraspinal and other soft tissues: There are bilateral psoas muscle collections, measuring up to 2.6 cm on the right and 2.5 cm on the left. Disc levels: L1-2: Normal. L2-3: Intermediate disc bulge with mild facet hypertrophy. No stenosis. L3-4: Left eccentric disc bulge with mild left lateral recess narrowing. Mild left foraminal stenosis. No spinal canal stenosis. L4-5: Abnormal enhancement as above. The abnormal enhancement extends into both neural foramina. There is severe bilateral foraminal stenosis. No central spinal canal stenosis. There is mild contrast enhancement in the soft tissues adjacent to the facet joints. L5-S1: There is edema of the  paraspinal soft tissues. No disc herniation or stenosis. IMPRESSION: 1. Abnormal enhancement of the L4 and L5 vertebral bodies and disc space, consistent with discitis osteomyelitis. There is severe bilateral foraminal stenosis at the L4-5 level with abnormal contrast enhancement extending into both neural foramina. 2. Bilateral psoas muscle abscesses, measuring up to 2.6 cm on the right and 2.5 cm on the left. Paraspinal soft tissue inflammation at L4-5 with fluid in both L4-5 facet joints, which may be reactive or indicative of septic arthritis. 3. Abnormal signal and enhancement of the T1 vertebral body with surrounding paraspinal soft tissue enhancement, possibly indicating osteomyelitis. 4. Multiple cavitary lesions within both lungs, consistent with septic emboli. Electronically Signed   By: Ulyses Jarred M.D.   On: 02/21/2019 23:43   Mr Lumbar Spine W Wo Contrast (assess For Abscess, Cord Compression)  Result Date: 02/21/2019 CLINICAL DATA:  Back pain with weakness. History of IV drug use. EXAM: MRI THORACIC AND LUMBAR SPINE WITHOUT AND WITH CONTRAST TECHNIQUE: Multiplanar and multiecho pulse sequences of the thoracic and lumbar spine were obtained without and with intravenous contrast. CONTRAST:  5mL GADAVIST GADOBUTROL 1 MMOL/ML IV SOLN COMPARISON:  None. FINDINGS: MRI THORACIC SPINE FINDINGS Alignment:  Physiologic. Vertebrae: Hyperintense T2-weighted signal and contrast enhancement within the T1 vertebrae. Cord:  Normal Paraspinal and other soft tissues: There are multiple cavitary lesions within the left lung. There is a large cavitary lesion of the right lung apex. Mild contrast enhancement of the paraspinal tissues at T1. Disc levels: Small central disc protrusion at T8-9 with no spinal canal stenosis. MRI LUMBAR SPINE FINDINGS Segmentation:  Standard. Alignment:  Normal Vertebrae: There is hyperintense T2-weighted signal throughout the  bone marrow of the L4 and L5 vertebral bodies, extending to  the disc space. There is abnormal contrast enhancement of both vertebra. Bone marrow signal is otherwise normal throughout the lumbar spine. There is fluid within both L4-5 facet joints. Conus medullaris: Extends to the L2 level and appears normal. Paraspinal and other soft tissues: There are bilateral psoas muscle collections, measuring up to 2.6 cm on the right and 2.5 cm on the left. Disc levels: L1-2: Normal. L2-3: Intermediate disc bulge with mild facet hypertrophy. No stenosis. L3-4: Left eccentric disc bulge with mild left lateral recess narrowing. Mild left foraminal stenosis. No spinal canal stenosis. L4-5: Abnormal enhancement as above. The abnormal enhancement extends into both neural foramina. There is severe bilateral foraminal stenosis. No central spinal canal stenosis. There is mild contrast enhancement in the soft tissues adjacent to the facet joints. L5-S1: There is edema of the paraspinal soft tissues. No disc herniation or stenosis. IMPRESSION: 1. Abnormal enhancement of the L4 and L5 vertebral bodies and disc space, consistent with discitis osteomyelitis. There is severe bilateral foraminal stenosis at the L4-5 level with abnormal contrast enhancement extending into both neural foramina. 2. Bilateral psoas muscle abscesses, measuring up to 2.6 cm on the right and 2.5 cm on the left. Paraspinal soft tissue inflammation at L4-5 with fluid in both L4-5 facet joints, which may be reactive or indicative of septic arthritis. 3. Abnormal signal and enhancement of the T1 vertebral body with surrounding paraspinal soft tissue enhancement, possibly indicating osteomyelitis. 4. Multiple cavitary lesions within both lungs, consistent with septic emboli. Electronically Signed   By: Deatra Robinson M.D.   On: 02/21/2019 23:43   US Renal  Result Date: 02/22/2019 CLINICAL DATA:  Acute renal injury EXAM: RENAL / URINARY TRACT ULTRASOUND COMPLETE COMPARISON:  None. FINDINGS: Right Kidney: Renal measurements:  11.2 x 6.2 x 5.9 cm. = volume: 213 mL. Mild hydronephrosis is noted. Mild increased echogenicity is seen. Left Kidney: Renal measurements: 13 x 5.8 x 5.7 cm = volume: 223 mL. Increased echogenicity is noted. Mild hydronephrosis is noted. Bladder: Bladder is well distended. Debris is noted within. Correlate with any possible UTI. Other: None IMPRESSION: Mild hydronephrosis is noted. This may be related to the distended bladder. Mild debris within the bladder which may be related to an underlying UTI. Clinical correlation is recommended. Increased echogenicity is noted consistent with medical renal disease. Electronically Signed   By: Alcide Clever M.D.   On: 02/22/2019 03:56   Dg Chest Port 1 View  Result Date: 02/21/2019 CLINICAL DATA:  Shortness of breath EXAM: PORTABLE CHEST 1 VIEW COMPARISON:  None. FINDINGS: There is airspace opacity in the left lower lobe with small left pleural effusion consistent with pneumonia. Within this area, there is a nodular appearing area measuring 3.1 x 2.7 cm. There is a nodular opacity in the periphery of the left mid lung measuring 1.3 x 1.1 cm. There is a nodular opacity in the periphery of the right mid lung measuring 0.9 x 0.9 cm. There is an apparent cavitary lesion in the right upper lobe measuring 2.9 x 2.1 cm. There are smaller cavitary appearing lesions throughout the lungs bilaterally. Heart size and pulmonary vascularity are normal. No adenopathy. No bone lesions. IMPRESSION: 1.  Apparent pneumonia left base with small left pleural effusion. 2. Multiple nodular lesions, some of which appear cavitated. Question metastatic foci versus septic emboli. Advise contrast enhanced chest CT to further assess. No adenopathy appreciable by radiography.  Heart size normal. Electronically Signed  By: Bretta Bang III M.D.   On: 02/21/2019 20:50   Recent Results (from the past 240 hour(s))  SARS Coronavirus 2 by RT PCR (hospital order, performed in [facility] hospital  lab) Nasopharyngeal Nasopharyngeal Swab     Status: None   Collection Time: 02/21/19  8:16 PM   Specimen: Nasopharyngeal Swab  Result Value Ref Range Status   SARS Coronavirus 2 NEGATIVE NEGATIVE Final    Comment: (NOTE) If result is NEGATIVE SARS-CoV-2 target nucleic acids are NOT DETECTED. The SARS-CoV-2 RNA is generally detectable in upper and lower  respiratory specimens during the acute phase of infection. The lowest  concentration of SARS-CoV-2 viral copies this assay can detect is 250  copies / mL. A negative result does not preclude SARS-CoV-2 infection  and should not be used as the sole basis for treatment or other  patient management decisions.  A negative result may occur with  improper specimen collection / handling, submission of specimen other  than nasopharyngeal swab, presence of viral mutation(s) within the  areas targeted by this assay, and inadequate number of viral copies  (<250 copies / mL). A negative result must be combined with clinical  observations, patient history, and epidemiological information. If result is POSITIVE SARS-CoV-2 target nucleic acids are DETECTED. The SARS-CoV-2 RNA is generally detectable in upper and lower  respiratory specimens dur ing the acute phase of infection.  Positive  results are indicative of active infection with SARS-CoV-2.  Clinical  correlation with patient history and other diagnostic information is  necessary to determine patient infection status.  Positive results do  not rule out bacterial infection or co-infection with other viruses. If result is PRESUMPTIVE POSTIVE SARS-CoV-2 nucleic acids MAY BE PRESENT.   A presumptive positive result was obtained on the submitted specimen  and confirmed on repeat testing.  While 2019 novel coronavirus  (SARS-CoV-2) nucleic acids may be present in the submitted sample  additional confirmatory testing may be necessary for epidemiological  and / or clinical management purposes  to  differentiate between  SARS-CoV-2 and other Sarbecovirus currently known to infect humans.  If clinically indicated additional testing with an alternate test  methodology [phone]) is advised. The SARS-CoV-2 RNA is generally  detectable in upper and lower respiratory sp ecimens during the acute  phase of infection. The expected result is Negative. Fact Sheet for Patients:  BoilerBrush.com.cy Fact Sheet for Healthcare Providers: https://pope.com/ This test is not yet approved or cleared by the [place] FDA and has been authorized for detection and/or diagnosis of SARS-CoV-2 by FDA under an Emergency Use Authorization (EUA).  This EUA will remain in effect (meaning this test can be used) for the duration of the COVID-19 declaration under Section 564(b)(1) of the Act, 21 U.S.C. section 360bbb-3(b)(1), unless the authorization is terminated or revoked sooner. Performed at [facility], [address] [place], [place] [address]   MRSA PCR Screening     Status: None   Collection Time: 02/22/19  4:56 AM   Specimen: Nasal Mucosa; Nasopharyngeal  Result Value Ref Range Status   MRSA by PCR NEGATIVE NEGATIVE Final    Comment:        The GeneXpert MRSA Assay (FDA approved for NASAL specimens only), is one component of a comprehensive MRSA colonization surveillance program. It is not intended to diagnose MRSA infection nor to guide or monitor treatment for MRSA infections. Performed at [facility], [address], [place], [place] [address]  Microbiology: Recent Results (from the past 240 hour(s))  SARS Coronavirus 2 by RT PCR (hospital order, performed in [facility] hospital lab) Nasopharyngeal Nasopharyngeal Swab     Status: None   Collection Time: 02/21/19  8:16 PM   Specimen: Nasopharyngeal Swab  Result Value Ref Range Status   SARS Coronavirus 2 NEGATIVE NEGATIVE Final    Comment:  (NOTE) If result is NEGATIVE SARS-CoV-2 target nucleic acids are NOT DETECTED. The SARS-CoV-2 RNA is generally detectable in upper and lower  respiratory specimens during the acute phase of infection. The lowest  concentration of SARS-CoV-2 viral copies this assay can detect is 250  copies / mL. A negative result does not preclude SARS-CoV-2 infection  and should not be used as the sole basis for treatment or other  patient management decisions.  A negative result may occur with  improper specimen collection / handling, submission of specimen other  than nasopharyngeal swab, presence of viral mutation(s) within the  areas targeted by this assay, and inadequate number of viral copies  (<250 copies / mL). A negative result must be combined with clinical  observations, patient history, and epidemiological information. If result is POSITIVE SARS-CoV-2 target nucleic acids are DETECTED. The SARS-CoV-2 RNA is generally detectable in upper and lower  respiratory specimens dur ing the acute phase of infection.  Positive  results are indicative of active infection with SARS-CoV-2.  Clinical  correlation with patient history and other diagnostic information is  necessary to determine patient infection status.  Positive results do  not rule out bacterial infection or co-infection with other viruses. If result is PRESUMPTIVE POSTIVE SARS-CoV-2 nucleic acids MAY BE PRESENT.   A presumptive positive result was obtained on the submitted specimen  and confirmed on repeat testing.  While 2019 novel coronavirus  (SARS-CoV-2) nucleic acids may be present in the submitted sample  additional confirmatory testing may be necessary for epidemiological  and / or clinical management purposes  to differentiate between  SARS-CoV-2 and other Sarbecovirus currently known to infect humans.  If clinically indicated additional testing with an alternate test  methodology [phone]) is advised. The SARS-CoV-2 RNA is  generally  detectable in upper and lower respiratory sp ecimens during the acute  phase of infection. The expected result is Negative. Fact Sheet for Patients:  BoilerBrush[employer]com.cy Fact Sheet for Healthcare Providers: https://pope.com/ This test is not yet approved or cleared by the [place] FDA and has been authorized for detection and/or diagnosis of SARS-CoV-2 by FDA under an Emergency Use Authorization (EUA).  This EUA will remain in effect (meaning this test can be used) for the duration of the COVID-19 declaration under Section 564(b)(1) of the Act, 21 U.S.C. section 360bbb-3(b)(1), unless the authorization is terminated or revoked sooner. Performed at [facility], [address] [place], [place] [address]   MRSA PCR Screening     Status: None   Collection Time: 02/22/19  4:56 AM   Specimen: Nasal Mucosa; Nasopharyngeal  Result Value Ref Range Status   MRSA by PCR NEGATIVE NEGATIVE Final    Comment:        The GeneXpert MRSA Assay (FDA approved for NASAL specimens only), is one component of a comprehensive MRSA colonization surveillance program. It is not intended to diagnose MRSA infection nor to guide or monitor treatment for MRSA infections. Performed at [facility], [address] [place], [place] [address]     Radiographs and labs were personally reviewed by me.   Johny Sax,  MD [facility] [phone] 02/22/2019, 11:05 AM

## 2019-02-22 NOTE — Procedures (Signed)
Central line note Need for sedating meds, poor access Emergent consent Timeout performed L neck prepped and draped Using US guidance left internal jugular vein cannulated without issue Sterile dressing applied CXR pending  Erskine Emery MD PCCM

## 2019-02-22 NOTE — Procedures (Signed)
Bronch with BAL Indication: abnormal CT, lack of air rise on bagging, concern for mucus plugging Emergent consent Timeout performed Patient already sedated from intubation Bronchoscope advanced down ETT with findings and procedures below, no immediate complications  Findings/Procedures - ETT in good position - Complete obstruction of left mainstem down to subsegmental level with thick mucoid secretions, therapeutically suctioned - BAL performed in lingula with return of cloudy fluid, sent for culture

## 2019-02-22 NOTE — Progress Notes (Signed)
PHARMACY - PHYSICIAN COMMUNICATION CRITICAL VALUE ALERT - BLOOD CULTURE IDENTIFICATION (BCID)  Franklin Anderson is a 48 y.o. male with hx heroin abuse presented to the ED on 02/21/2019 with c/o lower back pain, LE weakness and weight loss. Thoracic/lumbar spine MRI showed discitis osteomyelitis, septic lung emboli, and bilateral psoas muscle abscesses. Pt's currently on vancomycin and cefepime for infection.  - 2 of 4 blood cx bottles with [facility]. Micro lab will not perform BCID since reagent for the machine is on backorder.  Name of physician (or Provider) Contacted: Dr. Reesa Chew  Current antibiotics: vancomycin and cefepime  Changes to prescribed antibiotics recommended:  - recommend to continue with current abx regimen for now   Lynelle Doctor 02/22/2019  1:51 PM

## 2019-02-22 NOTE — Progress Notes (Signed)
PROGRESS NOTE    Franklin Anderson  ZHY:[id number] DOB: 1970-08-19 DOA: 02/21/2019 PCP: Julieanne Manson, MD   Brief Narrative:  48 year old with history of IV drug/heroin use presented with 3 weeks of worsening back pain and lower extremity weakness bilaterally.  Denies any urinary retention but does have some bowel incontinence with increasing trouble walking.  In the ER he was found to have osteomyelitis discitis with bleeding foraminal compression, septic pulmonary emboli, psoas muscle abscess.  Started on vancomycin/cefepime and Flagyl day   Assessment & Plan:   Principal Problem:   Sepsis (HCC) Active Problems:   Heroin use   Endocarditis   Septic pulmonary embolism (HCC)   Osteomyelitis of thoracic spine (HCC)   Osteomyelitis of lumbar spine (HCC)   Acute encephalopathy   AKI (acute kidney injury) (HCC)   Transaminitis   Psoas muscle abscess (HCC)   Pressure injury of skin  Sepsis secondary to osteomyelitis Subacute lower back pain with bilateral lower extremity weakness L4/L5/T1 discitis/osteomyelitis Bilateral psoas muscle abscess Multiple cavitary lesion consistent with septic emboli in the lungs -CT head negative.  Renal ultrasound-mild hydronephrosis and medical renal disease -Blood cultures-pending -IV vancomycin and cefepime -Echocardiogram, will likely need needing TEE  -Pain control -CT-guided aspiration.  Spoke with IR.  Acute metabolic encephalopathy -CT head negative.  Likely in the setting of acute sepsis rule out any further etiology.  MRI of brain-pending. -Every 6 Accu-Cheks  Acute kidney injury -Baseline 0.8 2019, admission creatinine 1.77 -Continue hydration.  Avoid nephrotoxic drugs. -Renal ultrasound-mild hydronephrosis with medical renal disease  Dysphagia, oropharyngeal? -Given his mentation unable to fully assess.  If he is awake enough, will have RN assess at bedside and if necessary get official speech and swallow  eval.  Hyponatremia, asymptomatic -Suspect secondary to intravascular volume depletion.  Should improve with IV fluids.  Mild transaminitis -Check HCV PCR, HIV -Follow LFTs  Moderate protein calorie malnutrition/hypoalbuminemia -Encourage oral diet  IV drug use/Heroine use -Counseling when mentation better.   DVT prophylaxis: Subcu heparin starting tomorrow Code Status: Full code Family Communication:   Disposition Plan: Maintain inpatient stay for IV antibiotics in the setting of severe sepsis from osteomyelitis/psoas muscle abscess and septic emboli to lower  Consultants:   Infectious disease  Interventional radiology  Procedures:   CT-guided aspiration 10/28  Antimicrobials:   Vancomycin  Cefepime   Subjective: Very drowsy but easily arousable.  Falls back asleep easily.  No complaints  Review of Systems Otherwise negative except as per HPI, including: General: Denies fever, chills, night sweats or unintended weight loss. Resp: Denies cough, wheezing, shortness of breath. Cardiac: Denies chest pain, palpitations, orthopnea, paroxysmal nocturnal dyspnea. GI: Denies abdominal pain, nausea, vomiting, diarrhea or constipation GU: Denies dysuria, frequency, hesitancy or incontinence MS: Denies muscle aches, joint pain or swelling Neuro: Denies headache, neurologic deficits (focal weakness, numbness, tingling), abnormal gait Psych: Denies anxiety, depression, SI/HI/AVH Skin: Denies new rashes or lesions ID: Denies sick contacts, exotic exposures, travel  Objective: Vitals:   02/22/19 0352 02/22/19 0439 02/22/19 0555 02/22/19 0700  BP: 140/90   (!) 144/100  Pulse: (!) 109  (!) 109   Resp: (!) 39  20 (!) 31  Temp:      TempSrc:      SpO2: 94%  96%   Weight:      Height:  6' (1.829 m)      Intake/Output Summary (Last 24 hours) at 02/22/2019 0734 Last data filed at 02/22/2019 0200 Gross per 24 hour  Intake 1400 ml  Output --  Net 1400 ml   Filed  Weights   02/21/19 1940 02/21/19 2104  Weight: 68 kg 63.5 kg    Examination:  General exam: Drowsy but easily arousable Respiratory system: Minimal coarse breath sounds Cardiovascular system: Normal sinus rhythm, no obvious murmur heard Gastrointestinal system: Abdomen is nondistended, soft and nontender. No organomegaly or masses felt. Normal bowel sounds heard. Central nervous system: Alert to his name and place.  Very drowsy easily arousable.  No focal neuro deficits. Extremities: Symmetric 4 x 5 power. Skin: No rashes, lesions or ulcers Psychiatry: Poor judgment and insight   Data Reviewed:   CBC: Recent Labs  Lab 02/21/19 2013 02/22/19 0533  WBC 22.4* 20.3*  NEUTROABS 20.6*  --   HGB 10.6* 10.3*  HCT 32.1* 31.5*  MCV 85.8 85.8  PLT 263 266   Basic Metabolic Panel: Recent Labs  Lab 02/21/19 2013 02/22/19 0533  NA 128* 131*  K 4.3 3.7  CL 89* 94*  CO2 24 23  GLUCOSE 148* 116*  BUN 111* 118*  CREATININE 1.77* 1.48*  CALCIUM 8.2* 8.1*   GFR: Estimated Creatinine Clearance: 54.8 mL/min (A) (by C-G formula based on SCr of 1.48 mg/dL (H)). Liver Function Tests: Recent Labs  Lab 02/21/19 2013 02/22/19 0533  AST 80* 72*  ALT 73* 68*  ALKPHOS 158* 137*  BILITOT 1.7* 1.8*  PROT 6.9 6.4*  ALBUMIN 1.9* 1.7*   No results for input(s): LIPASE, AMYLASE in the last 168 hours. No results for input(s): AMMONIA in the last 168 hours. Coagulation Profile: Recent Labs  Lab 02/21/19 2013  INR 1.4*   Cardiac Enzymes: No results for input(s): CKTOTAL, CKMB, CKMBINDEX, TROPONINI in the last 168 hours. BNP (last 3 results) No results for input(s): PROBNP in the last 8760 hours. HbA1C: No results for input(s): HGBA1C in the last 72 hours. CBG: No results for input(s): GLUCAP in the last 168 hours. Lipid Profile: No results for input(s): CHOL, HDL, LDLCALC, TRIG, CHOLHDL, LDLDIRECT in the last 72 hours. Thyroid Function Tests: No results for input(s): TSH,  T4TOTAL, FREET4, T3FREE, THYROIDAB in the last 72 hours. Anemia Panel: No results for input(s): VITAMINB12, FOLATE, FERRITIN, TIBC, IRON, RETICCTPCT in the last 72 hours. Sepsis Labs: Recent Labs  Lab 02/21/19 2013 02/21/19 2213  LATICACIDVEN 2.1* 1.1    Recent Results (from the past 240 hour(s))  SARS Coronavirus 2 by RT PCR (hospital order, performed in [facility] hospital lab) Nasopharyngeal Nasopharyngeal Swab     Status: None   Collection Time: 02/21/19  8:16 PM   Specimen: Nasopharyngeal Swab  Result Value Ref Range Status   SARS Coronavirus 2 NEGATIVE NEGATIVE Final    Comment: (NOTE) If result is NEGATIVE SARS-CoV-2 target nucleic acids are NOT DETECTED. The SARS-CoV-2 RNA is generally detectable in upper and lower  respiratory specimens during the acute phase of infection. The lowest  concentration of SARS-CoV-2 viral copies this assay can detect is 250  copies / mL. A negative result does not preclude SARS-CoV-2 infection  and should not be used as the sole basis for treatment or other  patient management decisions.  A negative result may occur with  improper specimen collection / handling, submission of specimen other  than nasopharyngeal swab, presence of viral mutation(s) within the  areas targeted by this assay, and inadequate number of viral copies  (<250 copies / mL). A negative result must be combined with clinical  observations, patient history, and epidemiological information. If result is POSITIVE SARS-CoV-2 target  nucleic acids are DETECTED. The SARS-CoV-2 RNA is generally detectable in upper and lower  respiratory specimens dur ing the acute phase of infection.  Positive  results are indicative of active infection with SARS-CoV-2.  Clinical  correlation with patient history and other diagnostic information is  necessary to determine patient infection status.  Positive results do  not rule out bacterial infection or co-infection with other viruses. If  result is PRESUMPTIVE POSTIVE SARS-CoV-2 nucleic acids MAY BE PRESENT.   A presumptive positive result was obtained on the submitted specimen  and confirmed on repeat testing.  While 2019 novel coronavirus  (SARS-CoV-2) nucleic acids may be present in the submitted sample  additional confirmatory testing may be necessary for epidemiological  and / or clinical management purposes  to differentiate between  SARS-CoV-2 and other Sarbecovirus currently known to infect humans.  If clinically indicated additional testing with an alternate test  methodology [phone]([id number]) is advised. The SARS-CoV-2 RNA is generally  detectable in upper and lower respiratory sp ecimens during the acute  phase of infection. The expected result is Negative. Fact Sheet for Patients:  BoilerBrush.com.cyhttps://www.fda.gov/media/136312/download Fact Sheet for Healthcare Providers: https://pope.com/https://www.fda.gov/media/136313/download This test is not yet approved or cleared by the [place] FDA and has been authorized for detection and/or diagnosis of SARS-CoV-2 by FDA under an Emergency Use Authorization (EUA).  This EUA will remain in effect (meaning this test can be used) for the duration of the COVID-19 declaration under Section 564(b)(1) of the Act, 21 U.S.C. section 360bbb-3(b)(1), unless the authorization is terminated or revoked sooner. Performed at [facility], [address] [place], [place] [address]   MRSA PCR Screening     Status: None   Collection Time: 02/22/19  4:56 AM   Specimen: Nasal Mucosa; Nasopharyngeal  Result Value Ref Range Status   MRSA by PCR NEGATIVE NEGATIVE Final    Comment:        The GeneXpert MRSA Assay (FDA approved for NASAL specimens only), is one component of a comprehensive MRSA colonization surveillance program. It is not intended to diagnose MRSA infection nor to guide or monitor treatment for MRSA infections. Performed at [facility], [address] [place], [place] [address]          Radiology Studies: Dg Skull 1-3 Views  Result Date: 02/21/2019 CLINICAL DATA:  48 year old male with MRI clearance. EXAM: SKULL - 1-3 VIEW COMPARISON:  None. FINDINGS: There is no evidence of skull fracture or other focal bone lesions. There is opacification of the right frontal sinus. Nasal and tongue piercings noted. No other radiopaque foreign object identified. IMPRESSION: 1. Nose ring and tongue piercing. No other radiopaque/metallic foreign object. 2. No evidence of skull fracture or other focal bone lesions. 3. Opacification of the right frontal sinus. . Electronically Signed   By: Elgie CollardArash  Radparvar M.D.   On: 02/21/2019 22:21   Dg Abd 1 View  Result Date: 02/21/2019 CLINICAL DATA:  Back pain, progressive neuro deficit, MR clearance EXAM: ABDOMEN - 1 VIEW COMPARISON:  None. FINDINGS: Cardiac monitoring leads overlie the pelvis. No surgical hardware or metallic foreign body is identified. There is a paucity of upper abdominal bowel gas, nonspecific. Patchy opacities are present in the left lung base with likely trace left effusion. Degenerative changes in the spine and hips. IMPRESSION: 1. No surgical hardware or metallic foreign body is identified. 2. Patchy opacities in the left lung base, suspicious for pneumonia. Trace left effusion. 3. Nonspecific paucity of upper abdominal bowel  gas. Electronically Signed   By: Kreg Shropshire M.D.   On: 02/21/2019 22:18   Ct Head Wo Contrast  Result Date: 02/22/2019 CLINICAL DATA:  Encephalopathy EXAM: CT HEAD WITHOUT CONTRAST TECHNIQUE: Contiguous axial images were obtained from the base of the skull through the vertex without intravenous contrast. COMPARISON:  None. FINDINGS: Brain: There is no mass, hemorrhage or extra-axial collection. The size and configuration of the ventricles and extra-axial CSF spaces are normal. The brain parenchyma is normal, without acute or chronic infarction. Vascular: No abnormal  hyperdensity of the major intracranial arteries or dural venous sinuses. No intracranial atherosclerosis. Skull: The visualized skull base, calvarium and extracranial soft tissues are normal. Sinuses/Orbits: No fluid levels or advanced mucosal thickening of the visualized paranasal sinuses. No mastoid or middle ear effusion. The orbits are normal. IMPRESSION: Normal head CT. Electronically Signed   By: Deatra Robinson M.D.   On: 02/22/2019 02:36   Mr Thoracic Spine W Wo Contrast  Result Date: 02/21/2019 CLINICAL DATA:  Back pain with weakness. History of IV drug use. EXAM: MRI THORACIC AND LUMBAR SPINE WITHOUT AND WITH CONTRAST TECHNIQUE: Multiplanar and multiecho pulse sequences of the thoracic and lumbar spine were obtained without and with intravenous contrast. CONTRAST:  6mL GADAVIST GADOBUTROL 1 MMOL/ML IV SOLN COMPARISON:  None. FINDINGS: MRI THORACIC SPINE FINDINGS Alignment:  Physiologic. Vertebrae: Hyperintense T2-weighted signal and contrast enhancement within the T1 vertebrae. Cord:  Normal Paraspinal and other soft tissues: There are multiple cavitary lesions within the left lung. There is a large cavitary lesion of the right lung apex. Mild contrast enhancement of the paraspinal tissues at T1. Disc levels: Small central disc protrusion at T8-9 with no spinal canal stenosis. MRI LUMBAR SPINE FINDINGS Segmentation:  Standard. Alignment:  Normal Vertebrae: There is hyperintense T2-weighted signal throughout the bone marrow of the L4 and L5 vertebral bodies, extending to the disc space. There is abnormal contrast enhancement of both vertebra. Bone marrow signal is otherwise normal throughout the lumbar spine. There is fluid within both L4-5 facet joints. Conus medullaris: Extends to the L2 level and appears normal. Paraspinal and other soft tissues: There are bilateral psoas muscle collections, measuring up to 2.6 cm on the right and 2.5 cm on the left. Disc levels: L1-2: Normal. L2-3: Intermediate disc  bulge with mild facet hypertrophy. No stenosis. L3-4: Left eccentric disc bulge with mild left lateral recess narrowing. Mild left foraminal stenosis. No spinal canal stenosis. L4-5: Abnormal enhancement as above. The abnormal enhancement extends into both neural foramina. There is severe bilateral foraminal stenosis. No central spinal canal stenosis. There is mild contrast enhancement in the soft tissues adjacent to the facet joints. L5-S1: There is edema of the paraspinal soft tissues. No disc herniation or stenosis. IMPRESSION: 1. Abnormal enhancement of the L4 and L5 vertebral bodies and disc space, consistent with discitis osteomyelitis. There is severe bilateral foraminal stenosis at the L4-5 level with abnormal contrast enhancement extending into both neural foramina. 2. Bilateral psoas muscle abscesses, measuring up to 2.6 cm on the right and 2.5 cm on the left. Paraspinal soft tissue inflammation at L4-5 with fluid in both L4-5 facet joints, which may be reactive or indicative of septic arthritis. 3. Abnormal signal and enhancement of the T1 vertebral body with surrounding paraspinal soft tissue enhancement, possibly indicating osteomyelitis. 4. Multiple cavitary lesions within both lungs, consistent with septic emboli. Electronically Signed   By: Deatra Robinson M.D.   On: 02/21/2019 23:43   Mr Lumbar Spine W Wo  Contrast (assess For Abscess, Cord Compression)  Result Date: 02/21/2019 CLINICAL DATA:  Back pain with weakness. History of IV drug use. EXAM: MRI THORACIC AND LUMBAR SPINE WITHOUT AND WITH CONTRAST TECHNIQUE: Multiplanar and multiecho pulse sequences of the thoracic and lumbar spine were obtained without and with intravenous contrast. CONTRAST:  6mL GADAVIST GADOBUTROL 1 MMOL/ML IV SOLN COMPARISON:  None. FINDINGS: MRI THORACIC SPINE FINDINGS Alignment:  Physiologic. Vertebrae: Hyperintense T2-weighted signal and contrast enhancement within the T1 vertebrae. Cord:  Normal Paraspinal and other  soft tissues: There are multiple cavitary lesions within the left lung. There is a large cavitary lesion of the right lung apex. Mild contrast enhancement of the paraspinal tissues at T1. Disc levels: Small central disc protrusion at T8-9 with no spinal canal stenosis. MRI LUMBAR SPINE FINDINGS Segmentation:  Standard. Alignment:  Normal Vertebrae: There is hyperintense T2-weighted signal throughout the bone marrow of the L4 and L5 vertebral bodies, extending to the disc space. There is abnormal contrast enhancement of both vertebra. Bone marrow signal is otherwise normal throughout the lumbar spine. There is fluid within both L4-5 facet joints. Conus medullaris: Extends to the L2 level and appears normal. Paraspinal and other soft tissues: There are bilateral psoas muscle collections, measuring up to 2.6 cm on the right and 2.5 cm on the left. Disc levels: L1-2: Normal. L2-3: Intermediate disc bulge with mild facet hypertrophy. No stenosis. L3-4: Left eccentric disc bulge with mild left lateral recess narrowing. Mild left foraminal stenosis. No spinal canal stenosis. L4-5: Abnormal enhancement as above. The abnormal enhancement extends into both neural foramina. There is severe bilateral foraminal stenosis. No central spinal canal stenosis. There is mild contrast enhancement in the soft tissues adjacent to the facet joints. L5-S1: There is edema of the paraspinal soft tissues. No disc herniation or stenosis. IMPRESSION: 1. Abnormal enhancement of the L4 and L5 vertebral bodies and disc space, consistent with discitis osteomyelitis. There is severe bilateral foraminal stenosis at the L4-5 level with abnormal contrast enhancement extending into both neural foramina. 2. Bilateral psoas muscle abscesses, measuring up to 2.6 cm on the right and 2.5 cm on the left. Paraspinal soft tissue inflammation at L4-5 with fluid in both L4-5 facet joints, which may be reactive or indicative of septic arthritis. 3. Abnormal signal  and enhancement of the T1 vertebral body with surrounding paraspinal soft tissue enhancement, possibly indicating osteomyelitis. 4. Multiple cavitary lesions within both lungs, consistent with septic emboli. Electronically Signed   By: Deatra Robinson M.D.   On: 02/21/2019 23:43   US Renal  Result Date: 02/22/2019 CLINICAL DATA:  Acute renal injury EXAM: RENAL / URINARY TRACT ULTRASOUND COMPLETE COMPARISON:  None. FINDINGS: Right Kidney: Renal measurements: 11.2 x 6.2 x 5.9 cm. = volume: 213 mL. Mild hydronephrosis is noted. Mild increased echogenicity is seen. Left Kidney: Renal measurements: 13 x 5.8 x 5.7 cm = volume: 223 mL. Increased echogenicity is noted. Mild hydronephrosis is noted. Bladder: Bladder is well distended. Debris is noted within. Correlate with any possible UTI. Other: None IMPRESSION: Mild hydronephrosis is noted. This may be related to the distended bladder. Mild debris within the bladder which may be related to an underlying UTI. Clinical correlation is recommended. Increased echogenicity is noted consistent with medical renal disease. Electronically Signed   By: Alcide Clever M.D.   On: 02/22/2019 03:56   Dg Chest Port 1 View  Result Date: 02/21/2019 CLINICAL DATA:  Shortness of breath EXAM: PORTABLE CHEST 1 VIEW COMPARISON:  None. FINDINGS: There  is airspace opacity in the left lower lobe with small left pleural effusion consistent with pneumonia. Within this area, there is a nodular appearing area measuring 3.1 x 2.7 cm. There is a nodular opacity in the periphery of the left mid lung measuring 1.3 x 1.1 cm. There is a nodular opacity in the periphery of the right mid lung measuring 0.9 x 0.9 cm. There is an apparent cavitary lesion in the right upper lobe measuring 2.9 x 2.1 cm. There are smaller cavitary appearing lesions throughout the lungs bilaterally. Heart size and pulmonary vascularity are normal. No adenopathy. No bone lesions. IMPRESSION: 1.  Apparent pneumonia left base  with small left pleural effusion. 2. Multiple nodular lesions, some of which appear cavitated. Question metastatic foci versus septic emboli. Advise contrast enhanced chest CT to further assess. No adenopathy appreciable by radiography.  Heart size normal. Electronically Signed   By: Lowella Grip III M.D.   On: 02/21/2019 20:50        Scheduled Meds:  enoxaparin (LOVENOX) injection  40 mg Subcutaneous Q24H   Continuous Infusions:  sodium chloride 125 mL/hr at 02/22/19 0552   sodium chloride       LOS: 0 days   Time spent= 45 mins    Kimyetta Flott Arsenio Loader, MD [facility]  If 7PM-7AM, please contact night-coverage  02/22/2019, 7:34 AM

## 2019-02-23 DIAGNOSIS — F1910 Other psychoactive substance abuse, uncomplicated: Secondary | ICD-10-CM

## 2019-02-23 DIAGNOSIS — I313 Pericardial effusion (noninflammatory): Secondary | ICD-10-CM

## 2019-02-23 DIAGNOSIS — B9561 Methicillin susceptible Staphylococcus aureus infection as the cause of diseases classified elsewhere: Secondary | ICD-10-CM

## 2019-02-23 DIAGNOSIS — M4644 Discitis, unspecified, thoracic region: Secondary | ICD-10-CM

## 2019-02-23 DIAGNOSIS — I330 Acute and subacute infective endocarditis: Secondary | ICD-10-CM

## 2019-02-23 DIAGNOSIS — R680 Hypothermia, not associated with low environmental temperature: Secondary | ICD-10-CM

## 2019-02-23 DIAGNOSIS — Z8619 Personal history of other infectious and parasitic diseases: Secondary | ICD-10-CM

## 2019-02-23 DIAGNOSIS — I10 Essential (primary) hypertension: Secondary | ICD-10-CM

## 2019-02-23 DIAGNOSIS — E43 Unspecified severe protein-calorie malnutrition: Secondary | ICD-10-CM | POA: Insufficient documentation

## 2019-02-23 DIAGNOSIS — J9690 Respiratory failure, unspecified, unspecified whether with hypoxia or hypercapnia: Secondary | ICD-10-CM

## 2019-02-23 DIAGNOSIS — M4646 Discitis, unspecified, lumbar region: Secondary | ICD-10-CM

## 2019-02-23 DIAGNOSIS — Z9911 Dependence on respirator [ventilator] status: Secondary | ICD-10-CM

## 2019-02-23 DIAGNOSIS — R7881 Bacteremia: Secondary | ICD-10-CM

## 2019-02-23 LAB — COMPREHENSIVE METABOLIC PANEL
ALT: 47 U/L — ABNORMAL HIGH (ref 0–44)
AST: 39 U/L (ref 15–41)
Albumin: 1.5 g/dL — ABNORMAL LOW (ref 3.5–5.0)
Alkaline Phosphatase: 93 U/L (ref 38–126)
Anion gap: 8 (ref 5–15)
BUN: 102 mg/dL — ABNORMAL HIGH (ref 6–20)
CO2: 24 mmol/L (ref 22–32)
Calcium: 7.8 mg/dL — ABNORMAL LOW (ref 8.9–10.3)
Chloride: 103 mmol/L (ref 98–111)
Creatinine, Ser: 1.25 mg/dL — ABNORMAL HIGH (ref 0.61–1.24)
GFR calc Af Amer: >60 mL/min (ref >60)
GFR calc non Af Amer: >60 mL/min (ref >60)
Glucose, Bld: 130 mg/dL — ABNORMAL HIGH (ref 70–99)
Potassium: 3.8 mmol/L (ref 3.5–5.1)
Sodium: 135 mmol/L (ref 135–145)
Total Bilirubin: 0.9 mg/dL (ref 0.3–1.2)
Total Protein: 5.9 g/dL — ABNORMAL LOW (ref 6.5–8.1)

## 2019-02-23 LAB — PHOSPHORUS
Phosphorus: 6.6 mg/dL — ABNORMAL HIGH (ref 2.5–4.6)
Phosphorus: 4.5 mg/dL (ref 2.5–4.6)

## 2019-02-23 LAB — GLUCOSE, CAPILLARY
Glucose-Capillary: 113 mg/dL — ABNORMAL HIGH (ref 70–99)
Glucose-Capillary: 128 mg/dL — ABNORMAL HIGH (ref 70–99)
Glucose-Capillary: 133 mg/dL — ABNORMAL HIGH (ref 70–99)
Glucose-Capillary: 140 mg/dL — ABNORMAL HIGH (ref 70–99)
Glucose-Capillary: 147 mg/dL — ABNORMAL HIGH (ref 70–99)
Glucose-Capillary: 176 mg/dL — ABNORMAL HIGH (ref 70–99)

## 2019-02-23 LAB — ABO/RH: ABO/RH(D): A POS

## 2019-02-23 LAB — HCV RNA QUANT RFLX ULTRA OR GENOTYP
HCV RNA Qnt(log copy/mL): UNDETERMINED log10 IU/mL
HepC Qn: NOT DETECTED IU/mL

## 2019-02-23 LAB — CBC
HCT: 26.5 % — ABNORMAL LOW (ref 39.0–52.0)
Hemoglobin: 8.4 g/dL — ABNORMAL LOW (ref 13.0–17.0)
MCH: 28.6 pg (ref 26.0–34.0)
MCHC: 31.7 g/dL (ref 30.0–36.0)
MCV: 90.1 fL (ref 80.0–100.0)
Platelets: 176 10*3/uL (ref 150–400)
RBC: 2.94 MIL/uL — ABNORMAL LOW (ref 4.22–5.81)
RDW: 15.5 % (ref 11.5–15.5)
WBC: 13.9 10*3/uL — ABNORMAL HIGH (ref 4.0–10.5)
nRBC: 0 % (ref 0.0–0.2)

## 2019-02-23 LAB — MAGNESIUM
Magnesium: 3.2 mg/dL — ABNORMAL HIGH (ref 1.7–2.4)
Magnesium: 3.1 mg/dL — ABNORMAL HIGH (ref 1.7–2.4)

## 2019-02-23 LAB — HEPATITIS C VRS RNA DETECT BY PCR-QUAL: Hepatitis C Vrs RNA by PCR-Qual: NEGATIVE

## 2019-02-23 LAB — RPR: RPR Ser Ql: NONREACTIVE

## 2019-02-23 LAB — BRAIN NATRIURETIC PEPTIDE: B Natriuretic Peptide: 193.3 pg/mL — ABNORMAL HIGH (ref 0.0–100.0)

## 2019-02-23 MED ORDER — SODIUM CHLORIDE 0.9% FLUSH
10.0000 mL | Freq: Two times a day (BID) | INTRAVENOUS | Status: DC
  Administered 2019-02-23 – 2019-02-27 (×10): 10 mL
  Administered 2019-02-28: 40 mL
  Administered 2019-02-28 – 2019-03-05 (×10): 10 mL
  Administered 2019-03-07: 22:00:00 30 mL
  Administered 2019-03-07 – 2019-03-26 (×19): 10 mL

## 2019-02-23 MED ORDER — CHLORHEXIDINE GLUCONATE 0.12% ORAL RINSE (MEDLINE KIT)
15.0000 mL | Freq: Two times a day (BID) | OROMUCOSAL | Status: DC
  Administered 2019-02-23 – 2019-02-25 (×5): 15 mL via OROMUCOSAL

## 2019-02-23 MED ORDER — SODIUM CHLORIDE 0.9% FLUSH: 10.0000 mL | INTRAVENOUS | Status: DC | PRN

## 2019-02-23 MED ORDER — NEPRO/CARBSTEADY PO LIQD
1000.0000 mL | ORAL | Status: DC
  Filled 2019-02-23: qty 1000

## 2019-02-23 MED ORDER — VANCOMYCIN HCL IN DEXTROSE 750-5 MG/150ML-% IV SOLN: 750.0000 mg | INTRAVENOUS | Status: DC

## 2019-02-23 MED ORDER — ORAL CARE MOUTH RINSE
15.0000 mL | OROMUCOSAL | Status: DC
  Administered 2019-02-23 – 2019-02-26 (×26): 15 mL via OROMUCOSAL

## 2019-02-23 MED ORDER — VANCOMYCIN HCL IN DEXTROSE 750-5 MG/150ML-% IV SOLN
750.0000 mg | Freq: Two times a day (BID) | INTRAVENOUS | Status: DC
  Administered 2019-02-23 – 2019-02-24 (×2): 750 mg via INTRAVENOUS
  Filled 2019-02-23 (×2): qty 150

## 2019-02-23 MED ORDER — NEPRO/CARBSTEADY PO LIQD
1000.0000 mL | ORAL | Status: DC
  Administered 2019-02-23 – 2019-02-24 (×2): 1000 mL
  Filled 2019-02-23 (×3): qty 1000

## 2019-02-23 MED ORDER — PRO-STAT SUGAR FREE PO LIQD
30.0000 mL | Freq: Three times a day (TID) | ORAL | Status: DC
  Administered 2019-02-23 – 2019-02-26 (×9): 30 mL
  Filled 2019-02-23 (×7): qty 30

## 2019-02-23 NOTE — Progress Notes (Addendum)
Pharmacy Antibiotic Note  DELRAY Franklin Anderson is a 48 y.o. male with hx heroin abuse presented to the ED on 02/21/2019 with c/o lower back pain, LE weakness and weight loss. Thoracic/lumbar spine MRI showed discitis osteomyelitis, septic lung emboli, and bilateral psoas muscle abscesses. Pharmacy is consulted to dose vancomycin and cefepime for infection.  - 10/27 CXR: Apparent pneumonia left base with small left pleural effusion  - 10/27 thoracic/lumbar spine MRI: Abnormal enhancement of the L4 and L5 vertebral bodies and disc space, consistent with discitis osteomyelitis; Bilateral psoas muscle abscesses; Abnormal signal and enhancement of the T1 vertebral body with surrounding paraspinal soft tissue enhancement, possibly indicating osteomyelitis. Multiple cavitary lesions within both lungs, consistent with septic emboli.  02/23/2019: - Tmax 100.4 - wbc elevated but improving - scr improving 1.23 (crcl~65), +UOP - Blood cx, abscess cx now + SA (sensitivities pending)  Plan: - Increase vancomycin 750 mg IV q12h for target AUC 400-550 - D/C Cefepime as discussed with Dr Tamala Julian - F/U renal function, cx data _______________________________________  Height: 6' (182.9 cm) Weight: 139 lb 5.3 oz (63.2 kg) IBW/kg (Calculated) : 77.6  Temp (24hrs), Avg:97.5 F (36.4 C), Min:94.1 F (34.5 C), Max:100.4 F (38 C)  Recent Labs  Lab 02/21/19 2013 02/21/19 2213 02/22/19 0533 02/22/19 1803 02/23/19 0802  WBC 22.4*  --  20.3* 22.9* 13.9*  CREATININE 1.77*  --  1.48* 1.23  --   LATICACIDVEN 2.1* 1.1  --   --   --     Estimated Creatinine Clearance: 65.7 mL/min (by C-G formula based on SCr of 1.23 mg/dL).    Allergies  Allergen Reactions  . Penicillins Other (See Comments)   Antimicrobials this admission:  10/28 vanc>> 10/28 cefepime>>10/29  Dose adjustments this admission:  10/29 Inc Vanc to 728m IV q12hh for improving renal function  Microbiology results:  10/28 BCx x2: SA 10/28  MRSA PCR: neg 10/28 right psoas abscess: SA 10/28 resp (BAL): mod GPC  Thank you for allowing pharmacy to be a part of this patient's care.  MNetta Cedars PharmD, BCPS 02/23/2019 8:48 AM

## 2019-02-23 NOTE — Progress Notes (Signed)
NAME:  Franklin Anderson, MRN:  [id number], DOB:  [birth year], LOS: 1 ADMISSION DATE:  02/21/2019, CONSULTATION DATE:  02/22/19 REFERRING MD:  Reesa Chew, CHIEF COMPLAINT:  Respiratory failure   Brief History   48 year old man with hx of active IVDA presenting with septic shock and multiorgan dysfunction.  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  This evening became more obtunded and dyspneic.  PCCM consulted for further management.  History per chart review due to acuity of condition.  Past Medical History  HTN Drug abuse  Significant Hospital Events   10/28 admitted, intubated  Consults:  ID, cardiology, PCCM  Procedures:  10/28 R psoas abscess aspiration  Significant Diagnostic Tests:  MRI Brain IMPRESSION: Motion degraded study without evidence of septic emboli. Nonspecific thin dural thickening, which could be secondary to lumbar puncture if recently performed. Unremarkable proximal intracranial circulation.  CXR IMPRESSION: Worsening airspace opacity in the left lower lung which is concerning for pneumonia.  Again noted are multiple bilateral rounded pulmonary nodular opacities throughout both lungs. This could be due to septic emboli.  Echo  1. Left ventricular ejection fraction, by visual estimation, is 60 to 65%. The left ventricle has normal function. There is no left ventricular hypertrophy.  2. Left ventricular diastolic parameters are consistent with Grade I diastolic dysfunction (impaired relaxation).  3. Global right ventricle has normal systolic function.The right ventricular size is normal. No increase in right ventricular wall thickness.  4. Left atrial size was normal.  5. Right atrial size was normal.  6. Moderate pericardial effusion.  7. The pericardial effusion is localized near the  right ventricle and localized near the right atrium.  8. Mild RV diastolic collapse. No mitral or tricuspid inflow variation.  9. Mild thickening of the anterior mitral valve leaflet(s). 10. The mitral valve is abnormal. Mild mitral valve regurgitation. No evidence of mitral stenosis. 11. Anterior mitral valve leaflet is mildly thickened and hypermobile. Mitral regurgitation is noted in the subcostal 4 chamber view that is concerning for leaflet perforation rather than at a site of malcoaptation. In the setting of IVDA recommend TEE  to better evaluate. 12. The tricuspid valve is normal in structure. Tricuspid valve regurgitation is trivial. 13. The aortic valve is normal in structure. Aortic valve regurgitation is not visualized. No evidence of aortic valve sclerosis or stenosis. 14. The pulmonic valve was normal in structure. Pulmonic valve regurgitation is not visualized. 15. Normal pulmonary artery systolic pressure. 16. The inferior vena cava is normal in size with greater than 50% respiratory variability, suggesting right atrial pressure of 3 mmHg.  Micro Data:  2x Blood culture 10/27>> Wound culture 10/27>>  Antimicrobials:  10/28 >> Vanc  10/28 >> Cefepime  Interim history/subjective:  No events, HR settled out with sedation and fluids overnight.  Objective   Blood pressure 92/71, pulse 88, temperature (!) 96.6 F (35.9 C), resp. rate 15, height 6' (1.829 m), weight 63.2 kg, SpO2 100 %.    Vent Mode: PRVC FiO2 (%):  [40 %-100 %] 60 % Set Rate:  [16 bmp] 16 bmp Vt Set:  [450 mL] 450 mL PEEP:  [5 cmH20] 5 cmH20 Plateau Pressure:  [14 cmH20-20 cmH20] 17 cmH20   Intake/Output Summary (Last 24 hours) at 02/23/2019 0746 Last data filed at 02/23/2019 0600 Gross per 24 hour  Intake 2812.22 ml  Output 1750 ml  Net 1062.22 ml   Filed Weights   02/21/19 1940 02/21/19 2104 02/23/19 0500  Weight: 68 kg 63.5 kg 63.2 kg    Examination: General: ill appearing cachetic man on  vent HENT: ETT in place with mild mucoid secretions on suctioning Lungs: L>R rhonci, triggering vent Cardiovascular: RRR, minimal murmur, ext warm Abdomen: Soft, +BS Extremities: 2+ BL edema to thighs Neuro: Withdraws to pain, gets agitated with SAT PSYCH: RASS -1  ABG looks good Uremia worse but Cr better last night Mg/Phos okay Mild transaminitis Very low albumin INR up a bit Sugars okay CXR yesterday- lines/tubes good, LLL looks worse, scattered septic emboli persist  Resolved Hospital Problem list   NA  Assessment & Plan:  # Acute hypoxemic respiratory failure due to inability to protect airway, mucus plugging, and severe sepsis # Acute kidney injury with severe uremia; I do not have a great explanation for his degree of uremia other than mild MAHA and AKI # Pericardial effusion, question uremic, did not look too bad on bedside US (10/29) but need to re-eval if he gets into HD trouble # MAHA, likely low grade DIC from infection # Multifocal infection from likely IE, TEE pending # Abnormal dural enhancement on MRI brain, would not be surprised if reflects meningeal spread of infection, nothing really to do with this # Severe protein calorie malnutrition present on admission evidenced by third spacing and cachexia  - AM labs to be drawn - Continue vent support with VAP precautions in place - Abx per ID, TEE at some point, f/u culture data (bronch, blood, abscess) - TF, watch K/Phos for refeeding - Poor prognosis mostly related to deconditioning and what appears to be starvation - CM vs. SW consult, his brother states patient has kids in their 79s in [place]?  Best practice:  Diet: TF Pain/Anxiety/Delirium protocol (if indicated): will order VAP protocol (if indicated): will order DVT prophylaxis: heparin GI prophylaxis: PPI Glucose control: SSI Mobility: BR Code Status: Full Family Communication: Will call brother later today  Disposition: ICU  The patient is  critically ill with multiple organ systems failure and requires high complexity decision making for assessment and support, frequent evaluation and titration of therapies, application of advanced monitoring technologies and extensive interpretation of multiple databases. Critical Care Time devoted to patient care services described in this note independent of APP/resident time (if applicable)  is 31 minutes.   Myrla Halsted MD [facility] 02/23/2019 8:02 AM Personal pager: [phone] If unanswered, please page CCM On-call: #[phone]

## 2019-02-23 NOTE — Consult Note (Signed)
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  Worsening dyspnea EXAM: PORTABLE CHEST 1 VIEW COMPARISON:  February 21, 2019 FINDINGS: The cardiomediastinal silhouette is unchanged from prior exam. There is worsening patchy airspace opacity at the left lung base. There are multiple rounded pulmonary nodules within the periphery of the right lower lung and left upper lung as on the prior exam. IMPRESSION: Worsening airspace opacity in the left lower lung which is concerning for pneumonia. Again noted are multiple bilateral rounded pulmonary nodular opacities throughout both lungs. This could be due to septic emboli. Electronically Signed   By: Prudencio Pair M.D.   On: 02/22/2019 15:54   Dg Chest Port 1 View  Result Date: 02/21/2019 CLINICAL DATA:  Shortness of breath EXAM: PORTABLE CHEST 1 VIEW COMPARISON:  None. FINDINGS: There is airspace opacity in  the left  lower lobe with small left pleural effusion consistent with pneumonia. Within this area, there is a nodular appearing area measuring 3.1 x 2.7 cm. There is a nodular opacity in the periphery of the left mid lung measuring 1.3 x 1.1 cm. There is a nodular opacity in the periphery of the right mid lung measuring 0.9 x 0.9 cm. There is an apparent cavitary lesion in the right upper lobe measuring 2.9 x 2.1 cm. There are smaller cavitary appearing lesions throughout the lungs bilaterally. Heart size and pulmonary vascularity are normal. No adenopathy. No bone lesions. IMPRESSION: 1.  Apparent pneumonia left base with small left pleural effusion. 2. Multiple nodular lesions, some of which appear cavitated. Question metastatic foci versus septic emboli. Advise contrast enhanced chest CT to further assess. No adenopathy appreciable by radiography.  Heart size normal. Electronically Signed   By: Lowella Grip III M.D.   On: 02/21/2019 20:50   Dg Abd Portable 1v  Result Date: 02/22/2019 CLINICAL DATA:  OG tube placement EXAM: PORTABLE ABDOMEN - 1 VIEW COMPARISON:  02/21/2019 FINDINGS: Esophageal tube tip overlies the mid gastric region, side-port in the region of the cardia. Small left-sided pleural effusion with airspace disease at the left base. Cavitary lung lesions. IMPRESSION: 1. Esophageal tube tip overlies the gastric body 2. Small left pleural effusion with airspace disease at the left base. Multiple cavitary lung lesions. Electronically Signed   By: Donavan Foil M.D.   On: 02/22/2019 19:33    Assessment and Plan:   1. Pericardial effusion: moderate on echo 02/22/2019. Also with intermittent hypotension.  - Will evaluate further on TEE.   2. 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 ID following - c/f infective endocarditis. Patient is on broad spectrum antibiotics. Currently  intubated and sedated due to respiratory failure. No advanced directives to indicate HCPOA. Patient is not consentable for TEE at this time.    3. HTN: BP has generally been soft this admission. Reportedly non-compliant with lisinopril. - Continue to monitor   For questions or updates, please contact [facility] Please consult [email or web address] for contact info under Cardiology/STEMI.   Signed, Abigail Butts, PA-C  02/23/2019 10:24 AM [phone]  History and all data above reviewed.  The patient is intubated and sedated.  Unable to obtain history from the patient.  Chart reviewed.  I did review the echo images.  Agree with findings above.   Patient examined.  I agree with the findings as above.  The patient exam reveals GENERAL:  Thin and chronically ill appearing HEENT:  No subconjunctival hemorrhages NECK:  No jugular venous distention, waveform within normal limits, carotid upstroke brisk and symmetric, no bruits, no thyromegaly LYMPHATICS:  No cervical, inguinal adenopathy LUNGS:  Clear to auscultation bilaterally BACK:  No CVA tenderness CHEST:  Unremarkable HEART:  PMI not displaced or sustained,S1 and S2 within normal limits, no S3, no S4, no clicks, no rubs, no murmurs ABD:  Flat, positive bowel sounds normal in frequency in pitch, no bruits, no rebound, no guarding, no midline pulsatile mass, no hepatomegaly, no splenomegaly EXT:  2 plus pulses throughout, no edema, no cyanosis no clubbing SKIN:  No rashes no nodules, no nodules Janeway lesions or splinter hemorrhages.  NEURO:  Cranial nerves II through XII grossly intact, motor grossly intact throughout PSYCH:  Cognitively intact, oriented to person place and time     All available  labs, radiology testing, previous records reviewed. Agree with documented assessment and plan.   Bacteremia as noted. We will plan a bedside TEE.  No family or POA for consent.  This will need to be done as an emergent consent at the bedside.     Pericardial effusion:  Noted.  No evidence of tamponade.   Jeneen Rinks Georgena Weisheit  2:25 PM  02/23/2019

## 2019-02-23 NOTE — Progress Notes (Signed)
Nutrition Follow-up  DOCUMENTATION CODES:   Severe malnutrition in context of social or environmental circumstances  INTERVENTION:  - will adjust TF regimen: Nepro @ 30 ml/hr with 30 ml prostat TID. - this regimen + kcal from current propofol rate will provide 1897 kcal (96% estimated kcal need), 103 grams protein, and 523 ml free water.  - free water flush to continue to be per MD/NP order.    NUTRITION DIAGNOSIS:   Severe Malnutrition related to social / environmental circumstances(IVDU) as evidenced by severe fat depletion, severe muscle depletion. -revised  GOAL:   Patient will meet greater than or equal to 90% of their needs -will be met with TF regimen   MONITOR:   Vent status, TF tolerance, Labs, Weight trends, Skin  REASON FOR ASSESSMENT:   Ventilator, Consult Enteral/tube feeding initiation and management  ASSESSMENT:   48 y.o. male with medical history of IVDU, ongoing heroin use. Patient presented to the ED on 10/27 with 3 week history of progressively worsening back pain and BLE weakness.  Denies any urinary retention but has noticed some bowel incontinence. Stated that he has had increasing trouble walking and that he has lost weight. Last use of heroin was today (10/27).  Weight stable from yesterday. Patient intubated yesterday at ~1805 and bronch done shortly after. OGT in place and he is currently receiving TF per protocol: Vital High Protein @ 40 ml/hr with 30 ml prostat BID. This regimen is providing 1160 kcal, 114 grams protein, and 802 ml free water. Order for 100 ml free water every 4 hours (additional 600 ml/day).  Noted hypermagnesemia and hyperphosphatemia. Will change to renal-friendly formula in light of this. This should also help mitigate refeeding d/t decreased K, Mg, and Phos in formula.    Per notes: - acute hypoxemic respiratory failure d/t inability to protect airway, mucus plugging - severe sepsis - AKI with severe uremia - pericardial  effusion - multifocal infection thought to be 2/2 IE - abnormal dural enhancement on MRI brain - severe malnutrition--third spacing and cachexia - poor prognosis   Patient is currently intubated on ventilator support MV: 11.4 L/min Temp (24hrs), Avg:97.5 F (36.4 C), Min:94.1 F (34.5 C), Max:100.4 F (38 C) Propofol: 11.4 ml/hr (301 kcal) BP: 102/59 and MAP: 74  Labs reviewed; BUN: 102 mg/dl, creatinine: 1.25 mg/dl, Ca: 7.8 mg/dl, Phos: 6.6 mg/dl, Mg: 3.2 mg/dl.  Medications reviewed; 20 mg IV lasix x1 dose 10/28, sliding scale novolog, 2 g IV Mg sulfate x1 run 10/28.  Drips; propofol @ 30 mcg/kg/min, fentanyl @ 125 mcg/hr.      NUTRITION - FOCUSED PHYSICAL EXAM:    Most Recent Value  Orbital Region  Moderate depletion  Upper Arm Region  Severe depletion  Thoracic and Lumbar Region  Severe depletion  Buccal Region  Severe depletion  Temple Region  Severe depletion  Clavicle Bone Region  Severe depletion  Clavicle and Acromion Bone Region  Severe depletion  Scapular Bone Region  Unable to assess  Dorsal Hand  No depletion  Patellar Region  Severe depletion  Anterior Thigh Region  Severe depletion  Posterior Calf Region  Moderate depletion  Edema (RD Assessment)  None  Hair  Reviewed  Eyes  Unable to assess  Mouth  Unable to assess  Skin  Reviewed  Nails  Reviewed       Diet Order:   Diet Order            Diet NPO time specified  Diet effective now  EDUCATION NEEDS:   No education needs have been identified at this time  Skin:  Skin Assessment: Skin Integrity Issues: Skin Integrity Issues:: Stage I Stage I: sacrum  Last BM:  PTA/unknown  Height:   Ht Readings from Last 1 Encounters:  02/22/19 6' (1.829 m)    Weight:   Wt Readings from Last 1 Encounters:  02/23/19 63.2 kg    Ideal Body Weight:  80.9 kg  BMI:  Body mass index is 18.9 kg/m.  Estimated Nutritional Needs:   Kcal:  1969 kcal  Protein:  95-110 grams  Fluid:   >/= 2 L/day     Jarome Matin, MS, RD, LDN, [facility] Inpatient Clinical Dietitian Pager # [phone] After hours/weekend pager # [phone]

## 2019-02-23 NOTE — Progress Notes (Addendum)
INFECTIOUS DISEASE PROGRESS NOTE  ID: OMARR HANN is a 48 y.o. 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 Relates that both of pt's parents recently deceased.   Abtx:  Anti-infectives (From admission, onward)   Start     Dose/Rate Route Frequency Ordered Stop   02/23/19 2200  vancomycin (VANCOCIN) IVPB 750 mg/150 ml premix  Status:  Discontinued     750 mg 150 mL/hr over 60 Minutes Intravenous Every 24 hours 02/23/19 0853 02/23/19 0909   02/23/19 2200  vancomycin (VANCOCIN) IVPB 750 mg/150 ml premix     750 mg 150 mL/hr over 60 Minutes Intravenous Every 12 hours 02/23/19 0909     02/22/19 2200  vancomycin (VANCOCIN) IVPB 750 mg/150 ml premix  Status:  Discontinued     750 mg 150 mL/hr over 60 Minutes Intravenous Every 24 hours 02/22/19 0826 02/22/19 0827   02/22/19 2200  vancomycin (VANCOCIN) IVPB 1000 mg/200 mL premix  Status:  Discontinued     1,000 mg 200 mL/hr over 60 Minutes Intravenous Every 24 hours 02/22/19 0827 02/23/19 0853   02/22/19 0900  ceFEPIme (MAXIPIME) 2 g in sodium chloride 0.9 % 100 mL IVPB  Status:  Discontinued     2 g 200 mL/hr over 30 Minutes Intravenous Every 12 hours 02/22/19 0826 02/23/19 1135   02/21/19 2015  ceFEPIme (MAXIPIME) 2 g in sodium chloride 0.9 % 100 mL IVPB     2 g 200 mL/hr over 30 Minutes Intravenous  Once 02/21/19 2013 02/22/19 0031   02/21/19 2015  metroNIDAZOLE (FLAGYL) IVPB 500 mg     500 mg 100 mL/hr over 60 Minutes Intravenous  Once 02/21/19 2013 02/22/19 0031   02/21/19 2015  vancomycin (VANCOCIN) IVPB 1000 mg/200 mL premix      1,000 mg 200 mL/hr over 60 Minutes Intravenous  Once 02/21/19 2013 02/22/19 0200      Medications:  Scheduled:  chlorhexidine gluconate (MEDLINE KIT)  15 mL Mouth Rinse BID   Chlorhexidine Gluconate Cloth  6 each Topical Daily   [START ON 02/24/2019] feeding supplement (NEPRO CARB STEADY)  1,000 mL Per Tube Q24H   feeding supplement (PRO-STAT SUGAR FREE 64)  30 mL Per Tube TID   heparin injection (subcutaneous)  5,000 Units Subcutaneous Q8H   insulin aspart  0-24 Units Subcutaneous Q4H   ipratropium  0.5 mg Nebulization Q6H   levalbuterol  1.25 mg Nebulization Q6H   mouth rinse  15 mL Mouth Rinse 10 times per day   pantoprazole sodium  40 mg Per Tube Daily   sodium chloride flush  10-40 mL Intracatheter Q12H    Objective: Vital signs in last 24 hours: Temp:  [94.1 F (34.5 C)-100.4 F (38 C)] 99 F (37.2 C) (10/29 1100) Pulse Rate:  [88-118] 104 (10/29 1100) Resp:  [11-43] 14 (10/29 1100) BP: (82-168)/(52-107) 122/82 (10/29 1100) SpO2:  [88 %-100 %] 99 % (10/29 1146) FiO2 (%):  [40 %-100 %] 60 % (10/29 1146) Weight:  [63.2 kg] 63.2 kg (10/29 0500)   General appearance: no distress Resp: rhonchi anterior -  bilateral Cardio: regular rate and rhythm GI: normal findings: bowel sounds normal and soft, non-tender  Lab Results Recent Labs    02/22/19 1803 02/23/19 0802  WBC 22.9* 13.9*  HGB 10.2* 8.4*  HCT 31.4* 26.5*  NA 134* 135  K 3.6 3.8  CL 99 103  CO2 26 24  BUN 97* 102*  CREATININE 1.23 1.25*   Liver Panel Recent Labs    02/22/19 1803 02/23/19 0802  PROT 6.5 5.9*  ALBUMIN 1.7* 1.5*  AST 74* 39  ALT 65* 47*  ALKPHOS 121 93  BILITOT 1.7* 0.9   Sedimentation Rate No results for input(s): ESRSEDRATE in the last 72 hours. C-Reactive Protein No results for input(s): CRP in the last 72 hours.  Microbiology: Recent Results (from the past 240 hour(s))  Blood Culture (routine x 2)     Status: Abnormal (Preliminary result)   Collection  Time: 02/21/19  8:13 PM   Specimen: BLOOD  Result Value Ref Range Status   Specimen Description   Final    BLOOD BLOOD LEFT FOREARM Performed at [facility] [address], [place], [place] [address]    Special Requests   Final    BOTTLES DRAWN AEROBIC AND ANAEROBIC Blood Culture results may not be optimal due to an inadequate volume of blood received in culture bottles Performed at [facility] [address] [place], [place] [address]    Culture  Setup Time   Final    GRAM POSITIVE COCCI IN BOTH AEROBIC AND ANAEROBIC BOTTLES CRITICAL RESULT CALLED TO, READ BACK BY AND VERIFIED WITH: PHARMD J LEGGE [id number] AT 1337 BY CM CRITICAL RESULT CALLED TO, READ BACK BY AND VERIFIED WITH: PHARMD E JACKSON [id number] AT 1357 BY CM    Culture (A)  Final    STAPHYLOCOCCUS AUREUS SUSCEPTIBILITIES TO FOLLOW Performed at [facility], [place] [address], [place], [place] [address]    Report Status PENDING  Incomplete  SARS Coronavirus 2 by RT PCR (hospital order, performed in [facility] hospital lab) Nasopharyngeal Nasopharyngeal Swab     Status: None   Collection Time: 02/21/19  8:16 PM   Specimen: Nasopharyngeal Swab  Result Value Ref Range Status   SARS Coronavirus 2 NEGATIVE NEGATIVE Final    Comment: (NOTE) If result is NEGATIVE SARS-CoV-2 target nucleic acids are NOT DETECTED. The SARS-CoV-2 RNA is generally detectable in upper and lower  respiratory specimens during the acute phase of infection. The lowest  concentration of SARS-CoV-2 viral copies this assay can detect is 250  copies / mL. A negative result does not preclude SARS-CoV-2 infection  and should not be used as the sole basis for treatment or other  patient management decisions.  A negative result may occur with  improper specimen collection / handling, submission of specimen other  than nasopharyngeal swab, presence of viral mutation(s) within the  areas targeted by this assay, and  inadequate number of viral copies  (<250 copies / mL). A negative result must be combined with clinical  observations, patient history, and epidemiological information. If result is POSITIVE SARS-CoV-2 target nucleic acids are DETECTED. The SARS-CoV-2 RNA is generally detectable in upper and lower  respiratory specimens dur ing the acute phase of infection.  Positive  results are indicative of active infection with SARS-CoV-2.  Clinical  correlation with patient history and other diagnostic information is  necessary to determine patient infection status.  Positive results do  not rule out bacterial infection or co-infection with other  viruses. If result is PRESUMPTIVE POSTIVE SARS-CoV-2 nucleic acids MAY BE PRESENT.   A presumptive positive result was obtained on the submitted specimen  and confirmed on repeat testing.  While 2019 novel coronavirus  (SARS-CoV-2) nucleic acids may be present in the submitted sample  additional confirmatory testing may be necessary for epidemiological  and / or clinical management purposes  to differentiate between  SARS-CoV-2 and other Sarbecovirus currently known to infect humans.  If clinically indicated additional testing with an alternate test  methodology [phone]) is advised. The SARS-CoV-2 RNA is generally  detectable in upper and lower respiratory sp ecimens during the acute  phase of infection. The expected result is Negative. Fact Sheet for Patients:  StrictlyIdeas.no Fact Sheet for Healthcare Providers: BankingDealers.co.za This test is not yet approved or cleared by the [place] FDA and has been authorized for detection and/or diagnosis of SARS-CoV-2 by FDA under an Emergency Use Authorization (EUA).  This EUA will remain in effect (meaning this test can be used) for the duration of the COVID-19 declaration under Section 564(b)(1) of the Act, 21 U.S.C. section 360bbb-3(b)(1), unless the  authorization is terminated or revoked sooner. Performed at [facility], [place] [address], [place], [place] [address]   MRSA PCR Screening     Status: None   Collection Time: 02/22/19  4:56 AM   Specimen: Nasal Mucosa; Nasopharyngeal  Result Value Ref Range Status   MRSA by PCR NEGATIVE NEGATIVE Final    Comment:        The GeneXpert MRSA Assay (FDA approved for NASAL specimens only), is one component of a comprehensive MRSA colonization surveillance program. It is not intended to diagnose MRSA infection nor to guide or monitor treatment for MRSA infections. Performed at [facility], [place] [address] [place], [place] [address]   Blood Culture (routine x 2)     Status: Abnormal (Preliminary result)   Collection Time: 02/22/19  5:33 AM   Specimen: BLOOD  Result Value Ref Range Status   Specimen Description   Final    BLOOD LEFT ANTECUBITAL Performed at [facility] [address] [place], [place] [address]    Special Requests   Final    BOTTLES DRAWN AEROBIC AND ANAEROBIC Blood Culture adequate volume Performed at [facility] [address] [place], [place] [address]    Culture  Setup Time   Final    GRAM POSITIVE COCCI ANAEROBIC BOTTLE ONLY CRITICAL RESULT CALLED TO, READ BACK BY AND VERIFIED WITH: PHARMD E JACKSON [id number] AT 1108 BY CM    Culture (A)  Final    STAPHYLOCOCCUS AUREUS CULTURE REINCUBATED FOR BETTER GROWTH Performed at [facility], [place] [address] [place], [place] [address]    Report Status PENDING  Incomplete  Aerobic/Anaerobic Culture (surgical/deep wound)     Status: None (Preliminary result)   Collection Time: 02/22/19 10:05 AM   Specimen: Abscess  Result Value Ref Range Status   Specimen Description   Final    ABSCESS RT PSOAS Performed at [facility] [address] [place],  [address]    Special Requests   Final    NONE Performed  at [facility], [place] [address] [place], [place] [address]    Gram Stain   Final    RARE WBC PRESENT,BOTH PMN AND MONONUCLEAR FEW GRAM POSITIVE COCCI IN PAIRS    Culture   Final    MODERATE STAPHYLOCOCCUS AUREUS SUSCEPTIBILITIES TO FOLLOW  Performed at [facility], [place] [address], [place], [place] [address]    Report Status PENDING  Incomplete  Culture, respiratory (non-expectorated)     Status: None (Preliminary result)   Collection Time: 02/22/19  5:30 PM   Specimen: Bronchoalveolar Lavage; Respiratory  Result Value Ref Range Status   Specimen Description   Final    BRONCHIAL ALVEOLAR LAVAGE Performed at [facility] [address] [place], [place] [address]    Special Requests   Final    NONE Performed at [facility], [place] [address] [place], [place] [address]    Gram Stain   Final    ABUNDANT WBC PRESENT, PREDOMINANTLY PMN MODERATE GRAM POSITIVE COCCI    Culture   Final    CULTURE REINCUBATED FOR BETTER GROWTH Performed at [facility], [place] [address], [place], [place] [address]    Report Status PENDING  Incomplete    Studies/Results: Dg Skull 1-3 Views  Result Date: 02/21/2019 CLINICAL DATA:  48 year old male with MRI clearance. EXAM: SKULL - 1-3 VIEW COMPARISON:  None. FINDINGS: There is no evidence of skull fracture or other focal bone lesions. There is opacification of the right frontal sinus. Nasal and tongue piercings noted. No other radiopaque foreign object identified. IMPRESSION: 1. Nose ring and tongue piercing. No other radiopaque/metallic foreign object. 2. No evidence of skull fracture or other focal bone lesions. 3. Opacification of the right frontal sinus. . Electronically Signed   By: Anner Crete M.D.   On: 02/21/2019 22:21   Dg Chest 1 View  Result Date: 02/22/2019 CLINICAL DATA:  Status post central line placement EXAM: CHEST  1 VIEW COMPARISON:  02/22/2019,, 02/21/2019,  10/01/2014 FINDINGS: Interval intubation, tip of the endotracheal tube is about 3.8 cm superior to the carina. Esophageal tube tip is below the diaphragm but non included. Left IJ central venous catheter tip over the distal SVC. No left pneumothorax. Bilateral cavitary lesions. Progressive consolidation at the left lung base. Stable cardiomediastinal silhouette. IMPRESSION: 1. Support lines and tubes as above. Left-sided central venous catheter tip over the distal SVC. No left pneumothorax 2. Worsening consolidation at the left lung base. 3. Multiple bilateral cavitary lesions which may reflect septic emboli Electronically Signed   By: Donavan Foil M.D.   On: 02/22/2019 19:31   Dg Abd 1 View  Result Date: 02/21/2019 CLINICAL DATA:  Back pain, progressive neuro deficit, MR clearance EXAM: ABDOMEN - 1 VIEW COMPARISON:  None. FINDINGS: Cardiac monitoring leads overlie the pelvis. No surgical hardware or metallic foreign body is identified. There is a paucity of upper abdominal bowel gas, nonspecific. Patchy opacities are present in the left lung base with likely trace left effusion. Degenerative changes in the spine and hips. IMPRESSION: 1. No surgical hardware or metallic foreign body is identified. 2. Patchy opacities in the left lung base, suspicious for pneumonia. Trace left effusion. 3. Nonspecific paucity of upper abdominal bowel gas. Electronically Signed   By: Lovena Le M.D.   On: 02/21/2019 22:18   Ct Head Wo Contrast  Result Date: 02/22/2019 CLINICAL DATA:  Encephalopathy EXAM: CT HEAD WITHOUT CONTRAST TECHNIQUE: Contiguous axial images were obtained from the base of the skull through the vertex without intravenous contrast. COMPARISON:  None. FINDINGS: Brain: There is no mass, hemorrhage or extra-axial collection. The size and configuration of the ventricles and extra-axial CSF spaces are normal. The brain parenchyma is normal, without acute or chronic infarction. Vascular: No abnormal  hyperdensity of the  major intracranial arteries or dural venous sinuses. No intracranial atherosclerosis. Skull: The visualized skull base, calvarium and extracranial soft tissues are normal. Sinuses/Orbits: No fluid levels or advanced mucosal thickening of the visualized paranasal sinuses. No mastoid or middle ear effusion. The orbits are normal. IMPRESSION: Normal head CT. Electronically Signed   By: Ulyses Jarred M.D.   On: 02/22/2019 02:36   Mr Angio Head Wo Contrast  Result Date: 02/22/2019 CLINICAL DATA:  Encephalopathy, endocarditis and osteomyelitis EXAM: MRI HEAD WITHOUT CONTRAST MRA HEAD WITHOUT CONTRAST TECHNIQUE: Multiplanar, multiecho pulse sequences of the brain and surrounding structures were obtained without intravenous contrast. Angiographic images of the head were obtained using MRA technique without contrast. COMPARISON:  None. FINDINGS: Motion artifact is present. MRI HEAD FINDINGS Brain: There is no acute infarction or intracranial hemorrhage. There is no intracranial mass, mass effect, edema, or hydrocephalus. Thin pachymeningeal thickening is present. Vascular: Major vessel flow voids at the skull base are preserved. Skull and upper cervical spine: Marrow signal is within normal limits. Sinuses/Orbits: Minor paranasal sinus mucosal thickening. The orbits are unremarkable. Other: None. MRA HEAD FINDINGS Motion artifact present. There is preserved flow related enhancement of the intracranial internal carotid arteries and proximal middle and anterior cerebral arteries. Visualized intracranial vertebral arteries basilar artery, and proximal posterior cerebral arteries are also patent. There is fetal or near fetal origin of the left posterior cerebral artery. No gross aneurysm identified. IMPRESSION: Motion degraded study without evidence of septic emboli. Nonspecific thin dural thickening, which could be secondary to lumbar puncture if recently performed. Unremarkable proximal intracranial  circulation. Electronically Signed   By: Macy Mis M.D.   On: 02/22/2019 12:17   Mr Brain Wo Contrast  Result Date: 02/22/2019 CLINICAL DATA:  Encephalopathy, endocarditis and osteomyelitis EXAM: MRI HEAD WITHOUT CONTRAST MRA HEAD WITHOUT CONTRAST TECHNIQUE: Multiplanar, multiecho pulse sequences of the brain and surrounding structures were obtained without intravenous contrast. Angiographic images of the head were obtained using MRA technique without contrast. COMPARISON:  None. FINDINGS: Motion artifact is present. MRI HEAD FINDINGS Brain: There is no acute infarction or intracranial hemorrhage. There is no intracranial mass, mass effect, edema, or hydrocephalus. Thin pachymeningeal thickening is present. Vascular: Major vessel flow voids at the skull base are preserved. Skull and upper cervical spine: Marrow signal is within normal limits. Sinuses/Orbits: Minor paranasal sinus mucosal thickening. The orbits are unremarkable. Other: None. MRA HEAD FINDINGS Motion artifact present. There is preserved flow related enhancement of the intracranial internal carotid arteries and proximal middle and anterior cerebral arteries. Visualized intracranial vertebral arteries basilar artery, and proximal posterior cerebral arteries are also patent. There is fetal or near fetal origin of the left posterior cerebral artery. No gross aneurysm identified. IMPRESSION: Motion degraded study without evidence of septic emboli. Nonspecific thin dural thickening, which could be secondary to lumbar puncture if recently performed. Unremarkable proximal intracranial circulation. Electronically Signed   By: Macy Mis M.D.   On: 02/22/2019 12:17   Mr Thoracic Spine W Wo Contrast  Result Date: 02/21/2019 CLINICAL DATA:  Back pain with weakness. History of IV drug use. EXAM: MRI THORACIC AND LUMBAR SPINE WITHOUT AND WITH CONTRAST TECHNIQUE: Multiplanar and multiecho pulse sequences of the thoracic and lumbar spine were  obtained without and with intravenous contrast. CONTRAST:  23m GADAVIST GADOBUTROL 1 MMOL/ML IV SOLN COMPARISON:  None. FINDINGS: MRI THORACIC SPINE FINDINGS Alignment:  Physiologic. Vertebrae: Hyperintense T2-weighted signal and contrast enhancement within the T1 vertebrae. Cord:  Normal Paraspinal and other soft tissues: There are  multiple cavitary lesions within the left lung. There is a large cavitary lesion of the right lung apex. Mild contrast enhancement of the paraspinal tissues at T1. Disc levels: Small central disc protrusion at T8-9 with no spinal canal stenosis. MRI LUMBAR SPINE FINDINGS Segmentation:  Standard. Alignment:  Normal Vertebrae: There is hyperintense T2-weighted signal throughout the bone marrow of the L4 and L5 vertebral bodies, extending to the disc space. There is abnormal contrast enhancement of both vertebra. Bone marrow signal is otherwise normal throughout the lumbar spine. There is fluid within both L4-5 facet joints. Conus medullaris: Extends to the L2 level and appears normal. Paraspinal and other soft tissues: There are bilateral psoas muscle collections, measuring up to 2.6 cm on the right and 2.5 cm on the left. Disc levels: L1-2: Normal. L2-3: Intermediate disc bulge with mild facet hypertrophy. No stenosis. L3-4: Left eccentric disc bulge with mild left lateral recess narrowing. Mild left foraminal stenosis. No spinal canal stenosis. L4-5: Abnormal enhancement as above. The abnormal enhancement extends into both neural foramina. There is severe bilateral foraminal stenosis. No central spinal canal stenosis. There is mild contrast enhancement in the soft tissues adjacent to the facet joints. L5-S1: There is edema of the paraspinal soft tissues. No disc herniation or stenosis. IMPRESSION: 1. Abnormal enhancement of the L4 and L5 vertebral bodies and disc space, consistent with discitis osteomyelitis. There is severe bilateral foraminal stenosis at the L4-5 level with abnormal  contrast enhancement extending into both neural foramina. 2. Bilateral psoas muscle abscesses, measuring up to 2.6 cm on the right and 2.5 cm on the left. Paraspinal soft tissue inflammation at L4-5 with fluid in both L4-5 facet joints, which may be reactive or indicative of septic arthritis. 3. Abnormal signal and enhancement of the T1 vertebral body with surrounding paraspinal soft tissue enhancement, possibly indicating osteomyelitis. 4. Multiple cavitary lesions within both lungs, consistent with septic emboli. Electronically Signed   By: Ulyses Jarred M.D.   On: 02/21/2019 23:43   Mr Lumbar Spine W Wo Contrast (assess For Abscess, Cord Compression)  Result Date: 02/21/2019 CLINICAL DATA:  Back pain with weakness. History of IV drug use. EXAM: MRI THORACIC AND LUMBAR SPINE WITHOUT AND WITH CONTRAST TECHNIQUE: Multiplanar and multiecho pulse sequences of the thoracic and lumbar spine were obtained without and with intravenous contrast. CONTRAST:  46m GADAVIST GADOBUTROL 1 MMOL/ML IV SOLN COMPARISON:  None. FINDINGS: MRI THORACIC SPINE FINDINGS Alignment:  Physiologic. Vertebrae: Hyperintense T2-weighted signal and contrast enhancement within the T1 vertebrae. Cord:  Normal Paraspinal and other soft tissues: There are multiple cavitary lesions within the left lung. There is a large cavitary lesion of the right lung apex. Mild contrast enhancement of the paraspinal tissues at T1. Disc levels: Small central disc protrusion at T8-9 with no spinal canal stenosis. MRI LUMBAR SPINE FINDINGS Segmentation:  Standard. Alignment:  Normal Vertebrae: There is hyperintense T2-weighted signal throughout the bone marrow of the L4 and L5 vertebral bodies, extending to the disc space. There is abnormal contrast enhancement of both vertebra. Bone marrow signal is otherwise normal throughout the lumbar spine. There is fluid within both L4-5 facet joints. Conus medullaris: Extends to the L2 level and appears normal. Paraspinal  and other soft tissues: There are bilateral psoas muscle collections, measuring up to 2.6 cm on the right and 2.5 cm on the left. Disc levels: L1-2: Normal. L2-3: Intermediate disc bulge with mild facet hypertrophy. No stenosis. L3-4: Left eccentric disc bulge with mild left lateral recess narrowing. Mild  left foraminal stenosis. No spinal canal stenosis. L4-5: Abnormal enhancement as above. The abnormal enhancement extends into both neural foramina. There is severe bilateral foraminal stenosis. No central spinal canal stenosis. There is mild contrast enhancement in the soft tissues adjacent to the facet joints. L5-S1: There is edema of the paraspinal soft tissues. No disc herniation or stenosis. IMPRESSION: 1. Abnormal enhancement of the L4 and L5 vertebral bodies and disc space, consistent with discitis osteomyelitis. There is severe bilateral foraminal stenosis at the L4-5 level with abnormal contrast enhancement extending into both neural foramina. 2. Bilateral psoas muscle abscesses, measuring up to 2.6 cm on the right and 2.5 cm on the left. Paraspinal soft tissue inflammation at L4-5 with fluid in both L4-5 facet joints, which may be reactive or indicative of septic arthritis. 3. Abnormal signal and enhancement of the T1 vertebral body with surrounding paraspinal soft tissue enhancement, possibly indicating osteomyelitis. 4. Multiple cavitary lesions within both lungs, consistent with septic emboli. Electronically Signed   By: Ulyses Jarred M.D.   On: 02/21/2019 23:43   US Renal  Result Date: 02/22/2019 CLINICAL DATA:  Acute renal injury EXAM: RENAL / URINARY TRACT ULTRASOUND COMPLETE COMPARISON:  None. FINDINGS: Right Kidney: Renal measurements: 11.2 x 6.2 x 5.9 cm. = volume: 213 mL. Mild hydronephrosis is noted. Mild increased echogenicity is seen. Left Kidney: Renal measurements: 13 x 5.8 x 5.7 cm = volume: 223 mL. Increased echogenicity is noted. Mild hydronephrosis is noted. Bladder: Bladder is well  distended. Debris is noted within. Correlate with any possible UTI. Other: None IMPRESSION: Mild hydronephrosis is noted. This may be related to the distended bladder. Mild debris within the bladder which may be related to an underlying UTI. Clinical correlation is recommended. Increased echogenicity is noted consistent with medical renal disease. Electronically Signed   By: Inez Catalina M.D.   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 The abscesses are currently too small for drain placement, however we can proceed with CT-guided aspiration to provide material for culture. EXAM: CT-guided aspiration MEDICATIONS: The patient is currently admitted to the hospital and receiving intravenous antibiotics. The antibiotics were administered within an appropriate time frame prior to the initiation of the procedure. ANESTHESIA/SEDATION: Fentanyl 100 mcg IV; Versed 2 mg IV Moderate Sedation Time:  15 minutes The patient was continuously monitored during the procedure by the interventional radiology nurse under my direct supervision. COMPLICATIONS: None immediate. PROCEDURE: Informed written consent was obtained from the patient after a thorough discussion of the procedural risks, benefits and alternatives. All questions were addressed. A timeout was performed prior to the initiation of the procedure. A planning axial CT scan was performed. The fluid collections within the right psoas muscle were successfully identified. A suitable skin entry site was selected and marked. The overlying skin was sterilely prepped and draped in the standard fashion using chlorhexidine skin prep. Local anesthesia was attained by infiltration with 1% lidocaine. A small dermatotomy was made. Under intermittent CT guidance, an 18 gauge trocar needle was advanced into the fluid collection. Aspiration was then performed  yielding approximately 18 mL of purulent fluid. Samples were sent for Gram stain and culture. The trocar needle was removed. Post aspiration CT imaging demonstrates no evidence of hematoma or other immediate complication. IMPRESSION: Successful CT-guided aspiration of a right psoas abscess yielding 18 mL purulent fluid which was sent for culture. Signed, Criselda Peaches, MD,  [facility] Electronically Signed   By: Jacqulynn Cadet M.D.   On: 02/22/2019 14:57   Dg Chest Port 1 View  Result Date: 02/22/2019 CLINICAL DATA:  Worsening dyspnea EXAM: PORTABLE CHEST 1 VIEW COMPARISON:  February 21, 2019 FINDINGS: The cardiomediastinal silhouette is unchanged from prior exam. There is worsening patchy airspace opacity at the left lung base. There are multiple rounded pulmonary nodules within the periphery of the right lower lung and left upper lung as on the prior exam. IMPRESSION: Worsening airspace opacity in the left lower lung which is concerning for pneumonia. Again noted are multiple bilateral rounded pulmonary nodular opacities throughout both lungs. This could be due to septic emboli. Electronically Signed   By: Prudencio Pair M.D.   On: 02/22/2019 15:54   Dg Chest Port 1 View  Result Date: 02/21/2019 CLINICAL DATA:  Shortness of breath EXAM: PORTABLE CHEST 1 VIEW COMPARISON:  None. FINDINGS: There is airspace opacity in the left lower lobe with small left pleural effusion consistent with pneumonia. Within this area, there is a nodular appearing area measuring 3.1 x 2.7 cm. There is a nodular opacity in the periphery of the left mid lung measuring 1.3 x 1.1 cm. There is a nodular opacity in the periphery of the right mid lung measuring 0.9 x 0.9 cm. There is an apparent cavitary lesion in the right upper lobe measuring 2.9 x 2.1 cm. There are smaller cavitary appearing lesions throughout the lungs bilaterally. Heart size and pulmonary  vascularity are normal. No adenopathy. No bone lesions. IMPRESSION: 1.  Apparent pneumonia left base with small left pleural effusion. 2. Multiple nodular lesions, some of which appear cavitated. Question metastatic foci versus septic emboli. Advise contrast enhanced chest CT to further assess. No adenopathy appreciable by radiography.  Heart size normal. Electronically Signed   By: Lowella Grip III M.D.   On: 02/21/2019 20:50   Dg Abd Portable 1v  Result Date: 02/22/2019 CLINICAL DATA:  OG tube placement EXAM: PORTABLE ABDOMEN - 1 VIEW COMPARISON:  02/21/2019 FINDINGS: Esophageal tube tip overlies the mid gastric region, side-port in the region of the cardia. Small left-sided pleural effusion with airspace disease at the left base. Cavitary lung lesions. IMPRESSION: 1. Esophageal tube tip overlies the gastric body 2. Small left pleural effusion with airspace disease at the left base. Multiple cavitary lung lesions. Electronically Signed   By: Donavan Foil M.D.   On: 02/22/2019 19:33     Assessment/Plan: IVDA Psoas abscess- S aureus Staph bacteremia Septic pulmonary emboli L4-5, T1 osteo, discitis AKI Previously Hep C Ab+ VDRF  Total days of antibiotics: 2 vanco Hypothermic this AM.  Now on vent.  Cr improved.  Agree with stop cefepime and continue vanco alone.  Await Staph sensi.  Await STD testing.   Await TEE.          Bobby Rumpf MD, FACP Infectious Diseases (pager) [phone] [email or web address] 02/23/2019, 12:50 PM  LOS: 1 day

## 2019-02-23 NOTE — Progress Notes (Signed)
Spoke with daughter Liberty on phone regarding patient status.  Relates that this heroin abuse was never a relapse, he has used all his life.  We discussed he remains tenuous but stable.  When asked what we should do in the event of cardiac arrest, she agrees that chest compressions and shocks would not be in line with his wishes.  Continue all other care in interim.  Limited code order entered.  Erskine Emery MD

## 2019-02-24 ENCOUNTER — Inpatient Hospital Stay (HOSPITAL_COMMUNITY): Payer: Self-pay

## 2019-02-24 DIAGNOSIS — R768 Other specified abnormal immunological findings in serum: Secondary | ICD-10-CM

## 2019-02-24 DIAGNOSIS — G9341 Metabolic encephalopathy: Secondary | ICD-10-CM

## 2019-02-24 DIAGNOSIS — A4101 Sepsis due to Methicillin susceptible Staphylococcus aureus: Principal | ICD-10-CM

## 2019-02-24 DIAGNOSIS — R64 Cachexia: Secondary | ICD-10-CM

## 2019-02-24 DIAGNOSIS — R6521 Severe sepsis with septic shock: Secondary | ICD-10-CM

## 2019-02-24 DIAGNOSIS — R931 Abnormal findings on diagnostic imaging of heart and coronary circulation: Secondary | ICD-10-CM

## 2019-02-24 LAB — COMPREHENSIVE METABOLIC PANEL
ALT: 39 U/L (ref 0–44)
AST: 26 U/L (ref 15–41)
Albumin: 1.5 g/dL — ABNORMAL LOW (ref 3.5–5.0)
Alkaline Phosphatase: 88 U/L (ref 38–126)
Anion gap: 7 (ref 5–15)
BUN: 88 mg/dL — ABNORMAL HIGH (ref 6–20)
CO2: 26 mmol/L (ref 22–32)
Calcium: 7.8 mg/dL — ABNORMAL LOW (ref 8.9–10.3)
Chloride: 108 mmol/L (ref 98–111)
Creatinine, Ser: 0.8 mg/dL (ref 0.61–1.24)
GFR calc Af Amer: >60 mL/min (ref >60)
GFR calc non Af Amer: >60 mL/min (ref >60)
Glucose, Bld: 158 mg/dL — ABNORMAL HIGH (ref 70–99)
Potassium: 3.4 mmol/L — ABNORMAL LOW (ref 3.5–5.1)
Sodium: 141 mmol/L (ref 135–145)
Total Bilirubin: 0.8 mg/dL (ref 0.3–1.2)
Total Protein: 6.2 g/dL — ABNORMAL LOW (ref 6.5–8.1)

## 2019-02-24 LAB — GLUCOSE, CAPILLARY
Glucose-Capillary: 133 mg/dL — ABNORMAL HIGH (ref 70–99)
Glucose-Capillary: 136 mg/dL — ABNORMAL HIGH (ref 70–99)
Glucose-Capillary: 136 mg/dL — ABNORMAL HIGH (ref 70–99)
Glucose-Capillary: 146 mg/dL — ABNORMAL HIGH (ref 70–99)
Glucose-Capillary: 147 mg/dL — ABNORMAL HIGH (ref 70–99)
Glucose-Capillary: 150 mg/dL — ABNORMAL HIGH (ref 70–99)

## 2019-02-24 LAB — CBC
HCT: 27.1 % — ABNORMAL LOW (ref 39.0–52.0)
Hemoglobin: 8.5 g/dL — ABNORMAL LOW (ref 13.0–17.0)
MCH: 28.4 pg (ref 26.0–34.0)
MCHC: 31.4 g/dL (ref 30.0–36.0)
MCV: 90.6 fL (ref 80.0–100.0)
Platelets: 191 10*3/uL (ref 150–400)
RBC: 2.99 MIL/uL — ABNORMAL LOW (ref 4.22–5.81)
RDW: 15.5 % (ref 11.5–15.5)
WBC: 12.8 10*3/uL — ABNORMAL HIGH (ref 4.0–10.5)
nRBC: 0 % (ref 0.0–0.2)

## 2019-02-24 LAB — CULTURE, BLOOD (ROUTINE X 2): Special Requests: ADEQUATE

## 2019-02-24 LAB — PHOSPHORUS: Phosphorus: 3.6 mg/dL (ref 2.5–4.6)

## 2019-02-24 LAB — MAGNESIUM: Magnesium: 2.8 mg/dL — ABNORMAL HIGH (ref 1.7–2.4)

## 2019-02-24 IMAGING — DX DG CHEST 1V PORT
1 series · 1 of 1 positions shown · non-contrast
Comparison: Two days ago

CLINICAL DATA: Endotracheal tube

EXAM:
PORTABLE CHEST 1 VIEW

[chest ap]
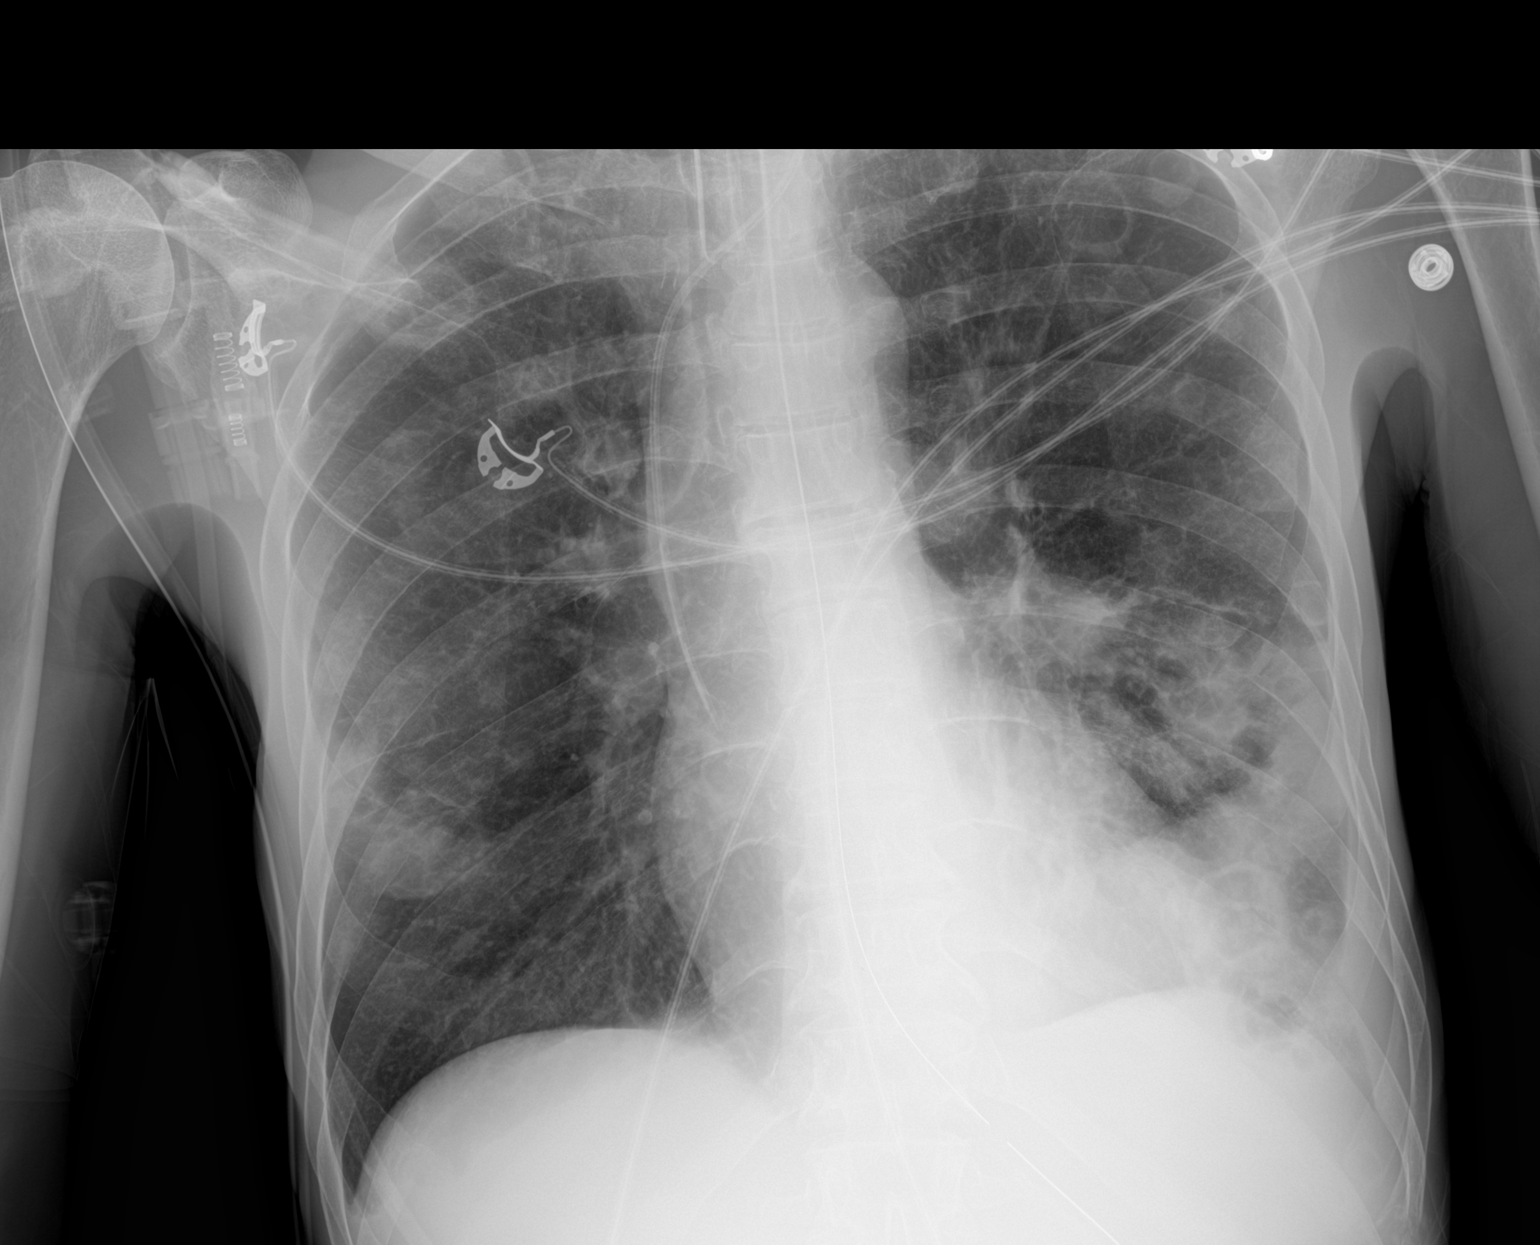

[1 of 1 positions shown; findings below may reference images not displayed]

FINDINGS: Endotracheal tube tip is just below the clavicular heads. Left IJ
line with tip at the upper cavoatrial junction. The orogastric tube
tip reaches the stomach. Bilateral cavitary pneumonia with small
left pleural effusion. No convincing change from prior. No visible
air leak.
IMPRESSION: 1. Unremarkable hardware positioning.
2. Unchanged multiple pulmonary cavities with left lower lobe
pneumonia and small pleural effusion, findings of septic emboli.

## 2019-02-24 MED ORDER — POTASSIUM CHLORIDE 10 MEQ/50ML IV SOLN
10.0000 meq | INTRAVENOUS | Status: AC
  Administered 2019-02-24 (×4): 10 meq via INTRAVENOUS
  Filled 2019-02-24 (×4): qty 50

## 2019-02-24 MED ORDER — CEFAZOLIN SODIUM-DEXTROSE 2-4 GM/100ML-% IV SOLN
2.0000 g | Freq: Three times a day (TID) | INTRAVENOUS | Status: AC
  Administered 2019-02-24 – 2019-04-12 (×143): 2 g via INTRAVENOUS
  Filled 2019-02-24 (×149): qty 100

## 2019-02-24 MED ORDER — VANCOMYCIN HCL IN DEXTROSE 1-5 GM/200ML-% IV SOLN: 1000.0000 mg | Freq: Two times a day (BID) | INTRAVENOUS | Status: DC

## 2019-02-24 MED ORDER — IPRATROPIUM BROMIDE 0.02 % IN SOLN
RESPIRATORY_TRACT | Status: AC
  Filled 2019-02-24: qty 2.5

## 2019-02-24 MED ORDER — LEVALBUTEROL HCL 1.25 MG/0.5ML IN NEBU
INHALATION_SOLUTION | RESPIRATORY_TRACT | Status: AC
  Filled 2019-02-24: qty 0.5

## 2019-02-24 NOTE — Progress Notes (Signed)
INFECTIOUS DISEASE PROGRESS NOTE  ID: Franklin Anderson is a 48 y.o. 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   Abtx:  Anti-infectives (From admission, onward)   Start     Dose/Rate Route Frequency Ordered Stop   02/23/19 2200  vancomycin (VANCOCIN) IVPB 750 mg/150 ml premix  Status:  Discontinued     750 mg 150 mL/hr over 60 Minutes Intravenous Every 24 hours 02/23/19 0853 02/23/19 0909   02/23/19 2200  vancomycin (VANCOCIN) IVPB 750 mg/150 ml premix     750 mg 150 mL/hr over 60 Minutes Intravenous Every 12 hours 02/23/19 0909     02/22/19 2200  vancomycin (VANCOCIN) IVPB 750 mg/150 ml premix  Status:  Discontinued     750 mg 150 mL/hr over 60 Minutes Intravenous Every 24 hours 02/22/19 0826 02/22/19 0827   02/22/19 2200  vancomycin (VANCOCIN) IVPB 1000 mg/200 mL premix  Status:  Discontinued     1,000 mg 200 mL/hr over 60 Minutes Intravenous Every 24 hours 02/22/19 0827 02/23/19 0853   02/22/19 0900  ceFEPIme (MAXIPIME) 2 g in sodium chloride 0.9 % 100 mL IVPB  Status:  Discontinued     2 g 200 mL/hr over 30 Minutes Intravenous Every 12 hours 02/22/19 0826 02/23/19 1135   02/21/19 2015  ceFEPIme (MAXIPIME) 2 g in sodium chloride 0.9 % 100 mL IVPB     2 g 200 mL/hr over 30 Minutes Intravenous  Once 02/21/19 2013 02/22/19 0031   02/21/19 2015  metroNIDAZOLE (FLAGYL) IVPB 500 mg     500 mg 100 mL/hr over 60 Minutes Intravenous  Once 02/21/19 2013 02/22/19 0031   02/21/19 2015  vancomycin (VANCOCIN) IVPB 1000 mg/200 mL premix     1,000 mg 200 mL/hr over 60 Minutes Intravenous  Once 02/21/19  2013 02/22/19 0200      Medications:  Scheduled:  chlorhexidine gluconate (MEDLINE KIT)  15 mL Mouth Rinse BID   Chlorhexidine Gluconate Cloth  6 each Topical Daily   feeding supplement (NEPRO CARB STEADY)  1,000 mL Per Tube Q24H   feeding supplement (PRO-STAT SUGAR FREE 64)  30 mL Per Tube TID   heparin injection (subcutaneous)  5,000 Units Subcutaneous Q8H   insulin aspart  0-24 Units Subcutaneous Q4H   ipratropium  0.5 mg Nebulization Q6H   levalbuterol  1.25 mg Nebulization Q6H   mouth rinse  15 mL Mouth Rinse 10 times per day   pantoprazole sodium  40 mg Per Tube Daily   sodium chloride flush  10-40 mL Intracatheter Q12H    Objective: Vital signs in last 24 hours: Temp:  [97.5 F (36.4 C)-101.8 F (38.8 C)] 99.1 F (37.3 C) (10/30 0800) Pulse Rate:  [92-123] 111 (10/30 0800) Resp:  [14-34] 34 (10/30 0800) BP: (102-164)/(59-104) 164/104 (10/30 0800) SpO2:  [93 %-100 %] 100 % (10/30 0800) FiO2 (%):  [30 %-60 %] 30 % (10/30 0723) Weight:  [63.3 kg] 63.3 kg (10/30 0500)   General appearance: cachectic and no distress Resp: rhonchi anterior - bilateral Cardio: regularly irregular rhythm GI: normal findings: soft, non-tender  and abnormal findings:  hypoactive bowel sounds  Lab Results Recent Labs    02/23/19 0802 02/24/19 0415  WBC 13.9* 12.8*  HGB 8.4* 8.5*  HCT 26.5* 27.1*  NA 135 141  K 3.8 3.4*  CL 103 108  CO2 24 26  BUN 102* 88*  CREATININE 1.25* 0.80   Liver Panel Recent Labs    02/23/19 0802 02/24/19 0415  PROT 5.9* 6.2*  ALBUMIN 1.5* 1.5*  AST 39 26  ALT 47* 39  ALKPHOS 93 88  BILITOT 0.9 0.8   Sedimentation Rate No results for input(s): ESRSEDRATE in the last 72 hours. C-Reactive Protein No results for input(s): CRP in the last 72 hours.  Microbiology: Recent Results (from the past 240 hour(s))  Blood Culture (routine x 2)     Status: Abnormal (Preliminary result)   Collection Time: 02/21/19  8:13 PM   Specimen: BLOOD LEFT  FOREARM  Result Value Ref Range Status   Specimen Description   Final    BLOOD LEFT FOREARM Performed at [facility], [address], [place], [place] [address]    Special Requests   Final    BOTTLES DRAWN AEROBIC AND ANAEROBIC Blood Culture results may not be optimal due to an inadequate volume of blood received in culture bottles Performed at [facility] [address], [place], [place] [address]    Culture  Setup Time   Final    GRAM POSITIVE COCCI IN BOTH AEROBIC AND ANAEROBIC BOTTLES CRITICAL RESULT CALLED TO, READ BACK BY AND VERIFIED WITH: PHARMD J LEGGE [id number] AT 1337 BY CM CRITICAL RESULT CALLED TO, READ BACK BY AND VERIFIED WITH: PHARMD E JACKSON [id number] AT 1357 BY CM    Culture (A)  Final    STAPHYLOCOCCUS AUREUS SUSCEPTIBILITIES TO FOLLOW Performed at [facility], [place] [address], [place],  [address]    Report Status PENDING  Incomplete  SARS Coronavirus 2 by RT PCR (hospital order, performed in [facility] hospital lab) Nasopharyngeal Nasopharyngeal Swab     Status: None   Collection Time: 02/21/19  8:16 PM   Specimen: Nasopharyngeal Swab  Result Value Ref Range Status   SARS Coronavirus 2 NEGATIVE NEGATIVE Final    Comment: (NOTE) If result is NEGATIVE SARS-CoV-2 target nucleic acids are NOT DETECTED. The SARS-CoV-2 RNA is generally detectable in upper and lower  respiratory specimens during the acute phase of infection. The lowest  concentration of SARS-CoV-2 viral copies this assay can detect is 250  copies / mL. A negative result does not preclude SARS-CoV-2 infection  and should not be used as the sole basis for treatment or other  patient management decisions.  A negative result may occur with  improper specimen collection / handling, submission of specimen other  than nasopharyngeal swab, presence of viral mutation(s) within the  areas targeted by this assay, and inadequate number of viral copies  (<250 copies / mL). A  negative result must be combined with clinical  observations, patient history, and epidemiological information. If result is POSITIVE SARS-CoV-2 target nucleic acids are DETECTED. The SARS-CoV-2 RNA is generally detectable in upper and lower  respiratory specimens dur ing the acute phase of infection.  Positive  results are indicative of active infection with SARS-CoV-2.  Clinical  correlation with patient history and other diagnostic information is  necessary to determine patient infection status.  Positive results do  not rule out bacterial infection or co-infection with other viruses. If result is PRESUMPTIVE POSTIVE SARS-CoV-2  nucleic acids MAY BE PRESENT.   A presumptive positive result was obtained on the submitted specimen  and confirmed on repeat testing.  While 2019 novel coronavirus  (SARS-CoV-2) nucleic acids may be present in the submitted sample  additional confirmatory testing may be necessary for epidemiological  and / or clinical management purposes  to differentiate between  SARS-CoV-2 and other Sarbecovirus currently known to infect humans.  If clinically indicated additional testing with an alternate test  methodology [phone]) is advised. The SARS-CoV-2 RNA is generally  detectable in upper and lower respiratory sp ecimens during the acute  phase of infection. The expected result is Negative. Fact Sheet for Patients:  StrictlyIdeas.no Fact Sheet for Healthcare Providers: BankingDealers.co.za This test is not yet approved or cleared by the [place] FDA and has been authorized for detection and/or diagnosis of SARS-CoV-2 by FDA under an Emergency Use Authorization (EUA).  This EUA will remain in effect (meaning this test can be used) for the duration of the COVID-19 declaration under Section 564(b)(1) of the Act, 21 U.S.C. section 360bbb-3(b)(1), unless the authorization is terminated or revoked sooner. Performed  at [facility], [place] [address], [place], [place] [address]   MRSA PCR Screening     Status: None   Collection Time: 02/22/19  4:56 AM   Specimen: Nasal Mucosa; Nasopharyngeal  Result Value Ref Range Status   MRSA by PCR NEGATIVE NEGATIVE Final    Comment:        The GeneXpert MRSA Assay (FDA approved for NASAL specimens only), is one component of a comprehensive MRSA colonization surveillance program. It is not intended to diagnose MRSA infection nor to guide or monitor treatment for MRSA infections. Performed at [facility], [place] [address] [place], [place] [address]   Blood Culture (routine x 2)     Status: Abnormal (Preliminary result)   Collection Time: 02/22/19  5:33 AM   Specimen: BLOOD  Result Value Ref Range Status   Specimen Description   Final    BLOOD LEFT ANTECUBITAL Performed at [facility] [address], [place], [place] [address]    Special Requests   Final    BOTTLES DRAWN AEROBIC AND ANAEROBIC Blood Culture adequate volume Performed at [facility] [address], [place], [place] [address]    Culture  Setup Time   Final    GRAM POSITIVE COCCI ANAEROBIC BOTTLE ONLY CRITICAL RESULT CALLED TO, READ BACK BY AND VERIFIED WITH: PHARMD E JACKSON [id number] AT 1108 BY CM    Culture (A)  Final    STAPHYLOCOCCUS AUREUS CULTURE REINCUBATED FOR BETTER GROWTH Performed at [facility], [place] [address] [place] [address], [place], [place] [address]    Report Status PENDING  Incomplete  Aerobic/Anaerobic Culture (surgical/deep wound)     Status: None (Preliminary result)   Collection Time: 02/22/19 10:05 AM   Specimen: Abscess  Result Value Ref Range Status   Specimen Description   Final    ABSCESS RT PSOAS Performed at [facility] [address], [place], [place] [address]    Special Requests   Final    NONE Performed at [facility], [place] [address],  [place],  [address]    Gram Stain   Final    RARE WBC PRESENT,BOTH PMN AND MONONUCLEAR FEW GRAM POSITIVE COCCI IN PAIRS    Culture   Final    MODERATE STAPHYLOCOCCUS AUREUS SUSCEPTIBILITIES TO FOLLOW Performed at [facility], [address] [place], [place] [address]    Report Status PENDING  Incomplete  Culture, respiratory (non-expectorated)     Status: None (Preliminary result)   Collection Time: 02/22/19  5:30 PM   Specimen: Bronchoalveolar Lavage; Respiratory  Result Value Ref Range Status   Specimen Description   Final    BRONCHIAL ALVEOLAR LAVAGE Performed at [facility] [address], [place], [place] [address]    Special Requests   Final    NONE Performed at [facility], [place] [address] [place], [place] [address]    Gram Stain   Final    ABUNDANT WBC PRESENT, PREDOMINANTLY PMN MODERATE GRAM POSITIVE COCCI    Culture   Final    CULTURE REINCUBATED FOR BETTER GROWTH Performed at [facility], [place] [address], [place], [place] [address]    Report Status PENDING  Incomplete    Studies/Results: Dg Chest 1 View  Result Date: 02/22/2019 CLINICAL DATA:  Status post central line placement EXAM: CHEST  1 VIEW COMPARISON:  02/22/2019,, 02/21/2019, 10/01/2014 FINDINGS: Interval intubation, tip of the endotracheal tube is about 3.8 cm superior to the carina. Esophageal tube tip is below the diaphragm but non included. Left IJ central venous catheter tip over the distal SVC. No left pneumothorax. Bilateral cavitary lesions. Progressive consolidation at the left lung base. Stable cardiomediastinal silhouette. IMPRESSION: 1. Support lines and tubes as above. Left-sided central venous catheter tip over the distal SVC. No left pneumothorax 2. Worsening consolidation at the left lung base. 3. Multiple bilateral cavitary lesions which may reflect septic emboli Electronically Signed   By: Donavan Foil M.D.   On: 02/22/2019 19:31    Mr Angio Head Wo Contrast  Result Date: 02/22/2019 CLINICAL DATA:  Encephalopathy, endocarditis and osteomyelitis EXAM: MRI HEAD WITHOUT CONTRAST MRA HEAD WITHOUT CONTRAST TECHNIQUE: Multiplanar, multiecho pulse sequences of the brain and surrounding structures were obtained without intravenous contrast. Angiographic images of the head were obtained using MRA technique without contrast. COMPARISON:  None. FINDINGS: Motion artifact is present. MRI HEAD FINDINGS Brain: There is no acute infarction or intracranial hemorrhage. There is no intracranial mass, mass effect, edema, or hydrocephalus. Thin pachymeningeal thickening is present. Vascular: Major vessel flow voids at the skull base are preserved. Skull and upper cervical spine: Marrow signal is within normal limits. Sinuses/Orbits: Minor paranasal sinus mucosal thickening. The orbits are unremarkable. Other: None. MRA HEAD FINDINGS Motion artifact present. There is preserved flow related enhancement of the intracranial internal carotid arteries and proximal middle and anterior cerebral arteries. Visualized intracranial vertebral arteries basilar artery, and proximal posterior cerebral arteries are also patent. There is fetal or near fetal origin of the left posterior cerebral artery. No gross aneurysm identified. IMPRESSION: Motion degraded study without evidence of septic emboli. Nonspecific thin dural thickening, which could be secondary to lumbar puncture if recently performed. Unremarkable proximal intracranial circulation. Electronically Signed   By: Macy Mis M.D.   On: 02/22/2019 12:17   Mr Brain Wo Contrast  Result Date: 02/22/2019 CLINICAL DATA:  Encephalopathy, endocarditis and osteomyelitis EXAM: MRI HEAD WITHOUT CONTRAST MRA HEAD WITHOUT CONTRAST TECHNIQUE: Multiplanar, multiecho pulse sequences of the brain and surrounding structures were obtained without intravenous contrast. Angiographic images of the head were obtained using MRA  technique without contrast. COMPARISON:  None. FINDINGS: Motion artifact is present. MRI HEAD FINDINGS Brain: There is no acute infarction or intracranial hemorrhage. There is no intracranial mass, mass effect, edema, or hydrocephalus. Thin pachymeningeal thickening is present. Vascular:  Major vessel flow voids at the skull base are preserved. Skull and upper cervical spine: Marrow signal is within normal limits. Sinuses/Orbits: Minor paranasal sinus mucosal thickening. The orbits are unremarkable. Other: None. MRA HEAD FINDINGS Motion artifact present. There is preserved flow related enhancement of the intracranial internal carotid arteries and proximal middle and anterior cerebral arteries. Visualized intracranial vertebral arteries basilar artery, and proximal posterior cerebral arteries are also patent. There is fetal or near fetal origin of the left posterior cerebral artery. No gross aneurysm identified. IMPRESSION: Motion degraded study without evidence of septic emboli. Nonspecific thin dural thickening, which could be secondary to lumbar puncture if recently performed. Unremarkable proximal intracranial circulation. Electronically Signed   By: Macy Mis M.D.   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 The abscesses are currently too small for drain placement, however we can proceed with CT-guided aspiration to provide material for culture. EXAM: CT-guided aspiration MEDICATIONS: The patient is currently admitted to the hospital and receiving intravenous antibiotics. The antibiotics were administered within an appropriate time frame prior to the initiation of the procedure. ANESTHESIA/SEDATION: Fentanyl 100 mcg IV; Versed 2 mg IV Moderate Sedation Time:  15 minutes The patient was continuously monitored during the procedure by the interventional radiology nurse under my  direct supervision. COMPLICATIONS: None immediate. PROCEDURE: Informed written consent was obtained from the patient after a thorough discussion of the procedural risks, benefits and alternatives. All questions were addressed. A timeout was performed prior to the initiation of the procedure. A planning axial CT scan was performed. The fluid collections within the right psoas muscle were successfully identified. A suitable skin entry site was selected and marked. The overlying skin was sterilely prepped and draped in the standard fashion using chlorhexidine skin prep. Local anesthesia was attained by infiltration with 1% lidocaine. A small dermatotomy was made. Under intermittent CT guidance, an 18 gauge trocar needle was advanced into the fluid collection. Aspiration was then performed yielding approximately 18 mL of purulent fluid. Samples were sent for Gram stain and culture. The trocar needle was removed. Post aspiration CT imaging demonstrates no evidence of hematoma or other immediate complication. IMPRESSION: Successful CT-guided aspiration of a right psoas abscess yielding 18 mL purulent fluid which was sent for culture. Signed, Criselda Peaches, MD, [facility] Electronically Signed   By: Jacqulynn Cadet M.D.   On: 02/22/2019 14:57   Dg Chest Port 1 View  Result Date: 02/24/2019 CLINICAL DATA:  Endotracheal tube EXAM: PORTABLE CHEST 1 VIEW COMPARISON:  Two days ago FINDINGS: Endotracheal tube tip is just below the clavicular heads. Left IJ line with tip at the upper cavoatrial junction. The orogastric tube tip reaches the stomach. Bilateral cavitary pneumonia with small left pleural effusion. No convincing change from prior. No visible air leak. IMPRESSION: 1. Unremarkable hardware positioning. 2. Unchanged multiple pulmonary cavities with left lower lobe pneumonia and small pleural effusion, findings of septic emboli. Electronically  Signed   By: Monte Fantasia M.D.   On: 02/24/2019 05:31   Dg Chest Port 1 View  Result Date: 02/22/2019 CLINICAL DATA:  Worsening dyspnea EXAM: PORTABLE CHEST 1 VIEW COMPARISON:  February 21, 2019 FINDINGS: The cardiomediastinal silhouette is unchanged from prior exam. There is worsening patchy airspace opacity at the left lung base. There are multiple rounded pulmonary nodules within the periphery of the right lower lung and  left upper lung as on the prior exam. IMPRESSION: Worsening airspace opacity in the left lower lung which is concerning for pneumonia. Again noted are multiple bilateral rounded pulmonary nodular opacities throughout both lungs. This could be due to septic emboli. Electronically Signed   By: Prudencio Pair M.D.   On: 02/22/2019 15:54   Dg Abd Portable 1v  Result Date: 02/22/2019 CLINICAL DATA:  OG tube placement EXAM: PORTABLE ABDOMEN - 1 VIEW COMPARISON:  02/21/2019 FINDINGS: Esophageal tube tip overlies the mid gastric region, side-port in the region of the cardia. Small left-sided pleural effusion with airspace disease at the left base. Cavitary lung lesions. IMPRESSION: 1. Esophageal tube tip overlies the gastric body 2. Small left pleural effusion with airspace disease at the left base. Multiple cavitary lung lesions. Electronically Signed   By: Donavan Foil M.D.   On: 02/22/2019 19:33     Assessment/Plan: IVDA Psoas abscess- S aureus Staph bacteremia 3/4 bottles Septic pulmonary emboli L4-5, T1 osteo, discitis AKI (resolved) Hep C Ab+ VDRF  Total days of antibiotics: 3 vanco  Cr improved. AKI resolved despite vanco.  Continue vanco alone.  Await Staph sensi.  Will send repeat BCx Await STD testing (GC, Chlamydia).  RPR (-) Await TEE.      Hep C RNA (-)             Bobby Rumpf MD, FACP Infectious Diseases (pager) [phone] [email or web address] 02/24/2019, 8:45 AM  LOS: 2 days

## 2019-02-24 NOTE — Progress Notes (Addendum)
Progress Note  Patient Name: Franklin Anderson Date of Encounter: 02/24/2019  Primary Cardiologist: New to [facility]; Dr. Percival Spanish  Subjective   Intubated and sedated. Opens eyes when name is called.  Inpatient Medications    Scheduled Meds:  chlorhexidine gluconate (MEDLINE KIT)  15 mL Mouth Rinse BID   Chlorhexidine Gluconate Cloth  6 each Topical Daily   feeding supplement (NEPRO CARB STEADY)  1,000 mL Per Tube Q24H   feeding supplement (PRO-STAT SUGAR FREE 64)  30 mL Per Tube TID   heparin injection (subcutaneous)  5,000 Units Subcutaneous Q8H   insulin aspart  0-24 Units Subcutaneous Q4H   ipratropium  0.5 mg Nebulization Q6H   levalbuterol  1.25 mg Nebulization Q6H   mouth rinse  15 mL Mouth Rinse 10 times per day   pantoprazole sodium  40 mg Per Tube Daily   sodium chloride flush  10-40 mL Intracatheter Q12H   Continuous Infusions:  fentaNYL infusion INTRAVENOUS 175 mcg/hr (02/24/19 0600)   potassium chloride 10 mEq (02/24/19 0812)   propofol (DIPRIVAN) infusion 40 mcg/kg/min (02/24/19 0627)   sodium chloride     vancomycin Stopped (02/23/19 2220)   PRN Meds: acetaminophen **OR** acetaminophen, acetaminophen, fentaNYL, HYDROmorphone (DILAUDID) injection, ipratropium-albuterol, LORazepam, ondansetron **OR** ondansetron (ZOFRAN) IV, senna-docusate, sodium chloride flush   Vital Signs    Vitals:   02/24/19 0520 02/24/19 0600 02/24/19 0723 02/24/19 0800  BP: (!) 145/101 (!) 143/89  (!) 164/104  Pulse: 95 (!) 102  (!) 111  Resp: (!) 26 (!) 25  (!) 34  Temp: 98.1 F (36.7 C) 98.8 F (37.1 C)  99.1 F (37.3 C)  TempSrc:      SpO2: 98% 93% 95% 100%  Weight:      Height:        Intake/Output Summary (Last 24 hours) at 02/24/2019 0919 Last data filed at 02/24/2019 6962 Gross per 24 hour  Intake 1796.9 ml  Output 1600 ml  Net 196.9 ml   Filed Weights   02/21/19 2104 02/23/19 0500 02/24/19 0500  Weight: 63.5 kg 63.2 kg 63.3 kg     Telemetry    Sinus tachycardia, rates in 110s - Personally Reviewed  ECG    No new tracings - Personally Reviewed  Physical Exam   GEN: Cachectic. Intubated and sedated; in no acute distress.   Neck: No JVD, no carotid bruits Cardiac: tachycardic, regular rhythm, no murmurs, rubs, or gallops.  Respiratory: Scattered rhonchi, ventilated GI: NABS, Soft, nontender, non-distended  MS: No edema; No deformity. Neuro:  Nonfocal, moving all extremities spontaneously Psych: Normal affect   Labs    Chemistry Recent Labs  Lab 02/22/19 1803 02/23/19 0802 02/24/19 0415  NA 134* 135 141  K 3.6 3.8 3.4*  CL 99 103 108  CO2 _0 GLUCOSE 128* 130* 158*  BUN 97* 102* 88*  CREATININE 1.23 1.25* 0.80  CALCIUM 8.0* 7.8* 7.8*  PROT 6.5 5.9* 6.2*  ALBUMIN 1.7* 1.5* 1.5*  AST 74* 39 26  ALT 65* 47* 39  ALKPHOS 121 93 88  BILITOT 1.7* 0.9 0.8  GFRNONAA >60 >60 >60  GFRAA >60 >60 >60  ANIONGAP _1 Hematology Recent Labs  Lab 02/22/19 1803 02/22/19 1850 02/23/19 0802 02/24/19 0415  WBC 22.9*  --  13.9* 12.8*  RBC 3.56*  --  2.94* 2.99*  HGB 10.2*  --  8.4* 8.5*  HCT 31.4*  --  26.5* 27.1*  MCV 88.2  --  90.1 90.6  MCH 28.7  --  28.6 28.4  MCHC 32.5  --  31.7 31.4  RDW 15.3  --  15.5 15.5  PLT 256 267 176 191    Cardiac EnzymesNo results for input(s): TROPONINI in the last 168 hours. No results for input(s): TROPIPOC in the last 168 hours.   BNP Recent Labs  Lab 02/23/19 0802  BNP 193.3*     DDimer  Recent Labs  Lab 02/22/19 1757 02/22/19 1850  DDIMER BLOOD/ANTICOAG RATIO IN TUBE UNSATISFACTORY 8.03*     Radiology    Dg Chest 1 View  Result Date: 02/22/2019 CLINICAL DATA:  Status post central line placement EXAM: CHEST  1 VIEW COMPARISON:  02/22/2019,, 02/21/2019, 10/01/2014 FINDINGS: Interval intubation, tip of the endotracheal tube is about 3.8 cm superior to the carina. Esophageal tube tip is below the diaphragm but non included. Left IJ  central venous catheter tip over the distal SVC. No left pneumothorax. Bilateral cavitary lesions. Progressive consolidation at the left lung base. Stable cardiomediastinal silhouette. IMPRESSION: 1. Support lines and tubes as above. Left-sided central venous catheter tip over the distal SVC. No left pneumothorax 2. Worsening consolidation at the left lung base. 3. Multiple bilateral cavitary lesions which may reflect septic emboli Electronically Signed   By: Donavan Foil M.D.   On: 02/22/2019 19:31   Mr Angio Head Wo Contrast  Result Date: 02/22/2019 CLINICAL DATA:  Encephalopathy, endocarditis and osteomyelitis EXAM: MRI HEAD WITHOUT CONTRAST MRA HEAD WITHOUT CONTRAST TECHNIQUE: Multiplanar, multiecho pulse sequences of the brain and surrounding structures were obtained without intravenous contrast. Angiographic images of the head were obtained using MRA technique without contrast. COMPARISON:  None. FINDINGS: Motion artifact is present. MRI HEAD FINDINGS Brain: There is no acute infarction or intracranial hemorrhage. There is no intracranial mass, mass effect, edema, or hydrocephalus. Thin pachymeningeal thickening is present. Vascular: Major vessel flow voids at the skull base are preserved. Skull and upper cervical spine: Marrow signal is within normal limits. Sinuses/Orbits: Minor paranasal sinus mucosal thickening. The orbits are unremarkable. Other: None. MRA HEAD FINDINGS Motion artifact present. There is preserved flow related enhancement of the intracranial internal carotid arteries and proximal middle and anterior cerebral arteries. Visualized intracranial vertebral arteries basilar artery, and proximal posterior cerebral arteries are also patent. There is fetal or near fetal origin of the left posterior cerebral artery. No gross aneurysm identified. IMPRESSION: Motion degraded study without evidence of septic emboli. Nonspecific thin dural thickening, which could be secondary to lumbar puncture if  recently performed. Unremarkable proximal intracranial circulation. Electronically Signed   By: Macy Mis M.D.   On: 02/22/2019 12:17   Mr Brain Wo Contrast  Result Date: 02/22/2019 CLINICAL DATA:  Encephalopathy, endocarditis and osteomyelitis EXAM: MRI HEAD WITHOUT CONTRAST MRA HEAD WITHOUT CONTRAST TECHNIQUE: Multiplanar, multiecho pulse sequences of the brain and surrounding structures were obtained without intravenous contrast. Angiographic images of the head were obtained using MRA technique without contrast. COMPARISON:  None. FINDINGS: Motion artifact is present. MRI HEAD FINDINGS Brain: There is no acute infarction or intracranial hemorrhage. There is no intracranial mass, mass effect, edema, or hydrocephalus. Thin pachymeningeal thickening is present. Vascular: Major vessel flow voids at the skull base are preserved. Skull and upper cervical spine: Marrow signal is within normal limits. Sinuses/Orbits: Minor paranasal sinus mucosal thickening. The orbits are unremarkable. Other: None. MRA HEAD FINDINGS Motion artifact present. There is preserved flow related enhancement of the intracranial internal carotid arteries and proximal middle and anterior cerebral arteries. Visualized  intracranial vertebral arteries basilar artery, and proximal posterior cerebral arteries are also patent. There is fetal or near fetal origin of the left posterior cerebral artery. No gross aneurysm identified. IMPRESSION: Motion degraded study without evidence of septic emboli. Nonspecific thin dural thickening, which could be secondary to lumbar puncture if recently performed. Unremarkable proximal intracranial circulation. Electronically Signed   By: Macy Mis M.D.   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 The abscesses are currently too small for drain placement, however we can  proceed with CT-guided aspiration to provide material for culture. EXAM: CT-guided aspiration MEDICATIONS: The patient is currently admitted to the hospital and receiving intravenous antibiotics. The antibiotics were administered within an appropriate time frame prior to the initiation of the procedure. ANESTHESIA/SEDATION: Fentanyl 100 mcg IV; Versed 2 mg IV Moderate Sedation Time:  15 minutes The patient was continuously monitored during the procedure by the interventional radiology nurse under my direct supervision. COMPLICATIONS: None immediate. PROCEDURE: Informed written consent was obtained from the patient after a thorough discussion of the procedural risks, benefits and alternatives. All questions were addressed. A timeout was performed prior to the initiation of the procedure. A planning axial CT scan was performed. The fluid collections within the right psoas muscle were successfully identified. A suitable skin entry site was selected and marked. The overlying skin was sterilely prepped and draped in the standard fashion using chlorhexidine skin prep. Local anesthesia was attained by infiltration with 1% lidocaine. A small dermatotomy was made. Under intermittent CT guidance, an 18 gauge trocar needle was advanced into the fluid collection. Aspiration was then performed yielding approximately 18 mL of purulent fluid. Samples were sent for Gram stain and culture. The trocar needle was removed. Post aspiration CT imaging demonstrates no evidence of hematoma or other immediate complication. IMPRESSION: Successful CT-guided aspiration of a right psoas abscess yielding 18 mL purulent fluid which was sent for culture. Signed, Criselda Peaches, MD, [facility] Radiology Electronically Signed   By: Jacqulynn Cadet M.D.   On: 02/22/2019 14:57   Dg Chest Port 1 View  Result Date: 02/24/2019 CLINICAL DATA:  Endotracheal tube EXAM: PORTABLE CHEST 1 VIEW  COMPARISON:  Two days ago FINDINGS: Endotracheal tube tip is just below the clavicular heads. Left IJ line with tip at the upper cavoatrial junction. The orogastric tube tip reaches the stomach. Bilateral cavitary pneumonia with small left pleural effusion. No convincing change from prior. No visible air leak. IMPRESSION: 1. Unremarkable hardware positioning. 2. Unchanged multiple pulmonary cavities with left lower lobe pneumonia and small pleural effusion, findings of septic emboli. Electronically Signed   By: Monte Fantasia M.D.   On: 02/24/2019 05:31   Dg Chest Port 1 View  Result Date: 02/22/2019 CLINICAL DATA:  Worsening dyspnea EXAM: PORTABLE CHEST 1 VIEW COMPARISON:  February 21, 2019 FINDINGS: The cardiomediastinal silhouette is unchanged from prior exam. There is worsening patchy airspace opacity at the left lung base. There are multiple rounded pulmonary nodules within the periphery of the right lower lung and left upper lung as on the prior exam. IMPRESSION: Worsening airspace opacity in the left lower lung which is concerning for pneumonia. Again noted are multiple bilateral rounded pulmonary nodular opacities throughout both lungs. This could be due to septic emboli. Electronically Signed   By: Prudencio Pair M.D.   On: 02/22/2019 15:54   Dg Abd  Portable 1v  Result Date: 02/22/2019 CLINICAL DATA:  OG tube placement EXAM: PORTABLE ABDOMEN - 1 VIEW COMPARISON:  02/21/2019 FINDINGS: Esophageal tube tip overlies the mid gastric region, side-port in the region of the cardia. Small left-sided pleural effusion with airspace disease at the left base. Cavitary lung lesions. IMPRESSION: 1. Esophageal tube tip overlies the gastric body 2. Small left pleural effusion with airspace disease at the left base. Multiple cavitary lung lesions. Electronically Signed   By: Donavan Foil M.D.   On: 02/22/2019 19:33    Cardiac Studies   Echocardiogram  1. Left ventricular ejection fraction, by visual  estimation, is 60 to 65%. The left ventricle has normal function. There is no left ventricular hypertrophy. 2. Left ventricular diastolic parameters are consistent with Grade I diastolic dysfunction (impaired relaxation). 3. Global right ventricle has normal systolic function.The right ventricular size is normal. No increase in right ventricular wall thickness. 4. Left atrial size was normal. 5. Right atrial size was normal. 6. Moderate pericardial effusion. 7. The pericardial effusion is localized near the right ventricle and localized near the right atrium. 8. Mild RV diastolic collapse. No mitral or tricuspid inflow variation. 9. Mild thickening of the anterior mitral valve leaflet(s). 10. The mitral valve is abnormal. Mild mitral valve regurgitation. No evidence of mitral stenosis. 11. Anterior mitral valve leaflet is mildly thickened and hypermobile. Mitral regurgitation is noted in the subcostal 4 chamber view that is concerning for leaflet perforation rather than at a site of malcoaptation. In the setting of IVDA recommend TEE  to better evaluate. 12. The tricuspid valve is normal in structure. Tricuspid valve regurgitation is trivial. 13. The aortic valve is normal in structure. Aortic valve regurgitation is not visualized. No evidence of aortic valve sclerosis or stenosis. 14. The pulmonic valve was normal in structure. Pulmonic valve regurgitation is not visualized. 15. Normal pulmonary artery systolic pressure. 16. The inferior vena cava is normal in size with greater than 50% respiratory variability, suggesting right atrial pressure of 3 mmHg.  Patient Profile     48 y.o. male with a PMH of IVDA, HTN, tobacco abuse, and rheumatic fever, who is being followed by cardiology for the evaluation of pericardial effusion and possible mitral valve vegetation  Assessment & Plan    1. Sepsis in the setting of IVDA: patient presented with back pain/LE weakness, found to have  osteomyelitis/discitis of L1/L4/L5, psoas muscle abscess, and septic emboli. ID following - c/f infective endocarditis. Patient is on broad spectrum antibiotics. Currently intubated and sedated due to respiratory failure. TTE reviewed by Dr. Marlou Porch. Mitral valve findings not c/w significant valve compromise. No indication for emergent TEE at this time.  - Consider TEE once patients status improved and he would be stable for transfer to [facility].  - Continue IV antibiotics per primary team/ID  2. Pericardial effusion: moderate on echo 02/22/2019. No tamponade clinically. - Continue to monitor for tamponade  3. HTN: BP is up this morning, though has been low at times this admission. Reportedly non-compliant with lisinopril. - Continue to monitor   For questions or updates, please contact [place] Please consult [email or web address] for contact info under Cardiology/STEMI.      Celso Sickle, PA-C  02/24/2019, 9:19 AM   [phone]  History and all data above reviewed.  Patient examined.  I agree with the findings as above.  Opens eyes to stimulation.  Purposeful for the nurses earlier  The patient exam reveals COR:RRR  ,  Lungs: Clear  ,  Abd: Positive bowel sounds, no rebound no guarding, Ext No edema  .  All available labs, radiology testing, previous records reviewed. Agree with documented assessment and plan.   We will plan elective TEE once extubated as TEE urgently likely will not change the course of therapy and valvular disease is not the limiting factor likely for extubation.  Discussed with CCM.   Jeneen Rinks Johnedward Brodrick  12:51 PM  02/24/2019

## 2019-02-24 NOTE — Progress Notes (Signed)
NAME:  Franklin Anderson, MRN:  [id number], DOB:  09-Feb-1971, LOS: 2 ADMISSION DATE:  02/21/2019, CONSULTATION DATE:  02/22/19 REFERRING MD:  Reesa Chew, CHIEF COMPLAINT:  Respiratory failure   Brief History   48 year old man with hx of active IVDA presenting with septic shock and multiorgan dysfunction.  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  This evening became more obtunded and dyspneic.  PCCM consulted for further management.  History per chart review due to acuity of condition.  Past Medical History  HTN Drug abuse  Significant Hospital Events   10/28 admitted, intubated  Consults:  ID, cardiology, PCCM  Procedures:  10/28 R psoas abscess aspiration  Significant Diagnostic Tests:  MRI Brain IMPRESSION: Motion degraded study without evidence of septic emboli. Nonspecific thin dural thickening, which could be secondary to lumbar puncture if recently performed. Unremarkable proximal intracranial circulation.  CXR IMPRESSION: Worsening airspace opacity in the left lower lung which is concerning for pneumonia.  Again noted are multiple bilateral rounded pulmonary nodular opacities throughout both lungs. This could be due to septic emboli.  Echo  1. Left ventricular ejection fraction, by visual estimation, is 60 to 65%. The left ventricle has normal function. There is no left ventricular hypertrophy.  2. Left ventricular diastolic parameters are consistent with Grade I diastolic dysfunction (impaired relaxation).  3. Global right ventricle has normal systolic function.The right ventricular size is normal. No increase in right ventricular wall thickness.  4. Left atrial size was normal.  5. Right atrial size was normal.  6. Moderate pericardial effusion.  7. The pericardial effusion is localized near the  right ventricle and localized near the right atrium.  8. Mild RV diastolic collapse. No mitral or tricuspid inflow variation.  9. Mild thickening of the anterior mitral valve leaflet(s). 10. The mitral valve is abnormal. Mild mitral valve regurgitation. No evidence of mitral stenosis. 11. Anterior mitral valve leaflet is mildly thickened and hypermobile. Mitral regurgitation is noted in the subcostal 4 chamber view that is concerning for leaflet perforation rather than at a site of malcoaptation. In the setting of IVDA recommend TEE  to better evaluate. 12. The tricuspid valve is normal in structure. Tricuspid valve regurgitation is trivial. 13. The aortic valve is normal in structure. Aortic valve regurgitation is not visualized. No evidence of aortic valve sclerosis or stenosis. 14. The pulmonic valve was normal in structure. Pulmonic valve regurgitation is not visualized. 15. Normal pulmonary artery systolic pressure. 16. The inferior vena cava is normal in size with greater than 50% respiratory variability, suggesting right atrial pressure of 3 mmHg.  Micro Data:  2x Blood culture 10/27>> staph aureus Wound culture 10/27>> staph aureus BAL 10/28 >>  Antimicrobials:  10/28 >> 10/29  Cefepime  Vanc  10/28 >>    Interim history/subjective:  Doing well this AM, off pressors, awake on sedation but not agitated.  Objective   Blood pressure (!) 143/89, pulse (!) 102, temperature 98.8 F (37.1 C), resp. rate (!) 25, height 6' (1.829 m), weight 63.3 kg, SpO2 95 %.    Vent Mode: PRVC FiO2 (%):  [30 %-60 %] 30 % Set Rate:  [16 bmp] 16 bmp Vt Set:  [450 mL] 450 mL PEEP:  [5 cmH20] 5 cmH20 Plateau Pressure:  [12 cmH20-16 cmH20] 12 cmH20   Intake/Output Summary (Last 24 hours) at  02/24/2019 0732 Last data filed at 02/24/2019 2449 Gross per 24 hour  Intake 1996.9 ml  Output 1600 ml  Net 396.9 ml   Filed Weights   02/21/19 2104 02/23/19 0500 02/24/19 0500  Weight: 63.5 kg 63.2 kg  63.3 kg    Examination: General: ill appearing cachetic man on vent HENT: ETT in place with mild mucoid secretions on suctioning Lungs: L>R rhonci, occasional accessory muscle use Cardiovascular: RRR, minimal murmur, ext warm Abdomen: Soft, +BS Extremities: 2+ BL edema to thighs Neuro: Moves all 4 ext to command PSYCH: RASS 0   Uremia slightly better Cr beter K a bit low, repleted Mg/Phos okay LFTs improved Very low albumin stable CBC stable Sugars okay CXR today looks the same, LLL opacity, scattered septic emboli, support tubes in good position  Resolved Hospital Problem list   # Acute kidney injury  Assessment & Plan:  # Acute hypoxemic respiratory failure due to inability to protect airway, mucus plugging, and severe sepsis. Improved, can consider weaning once TEE performed. # Uremia; I do not have a great explanation for his degree of uremia other than mild MAHA and AKI # Pericardial effusion, question uremic, did not look too bad on bedside US (10/29), HD stable # MAHA, likely low grade DIC from infection, H/H and plts stable # Multifocal infection from likely IE, TEE at some point # Abnormal dural enhancement on MRI brain, would not be surprised if reflects meningeal spread of infection, nothing really to do with this # Severe protein calorie malnutrition present on admission evidenced by third spacing and cachexia.  No signs of refeeding yet with TF  - Continue vent support with VAP precautions in place - Start weaning depending on if we need TEE urgently or can wait a bit, not sure it will change management at this juncture - Continue vanc, adjust pending methicillin susceptibility, prolonged course needed - TF, watch K/Phos for refeeding - Poor prognosis mostly related to deconditioning and what appears to be starvation; spoke with daughter Com[place] yesterday, patient now limited code   Best practice:  Diet: TF Pain/Anxiety/Delirium protocol (if indicated): will  order VAP protocol (if indicated): will order DVT prophylaxis: heparin GI prophylaxis: PPI Glucose control: SSI Mobility: BR Code Status: no chest compressions or shocks Family Communication: called daughter yesterday Disposition: ICU  The patient is critically ill with multiple organ systems failure and requires high complexity decision making for assessment and support, frequent evaluation and titration of therapies, application of advanced monitoring technologies and extensive interpretation of multiple databases. Critical Care Time devoted to patient care services described in this note independent of APP/resident time (if applicable)  is 30 minutes.   Erskine Emery MD [facility] 02/24/2019 7:32 AM Personal pager: [phone] If unanswered, please page CCM On-call: [phone]

## 2019-02-24 NOTE — Progress Notes (Signed)
Pharmacy Antibiotic Note  Franklin Anderson is a 48 y.o. male with hx heroin abuse presented to the ED on 02/21/2019 with c/o lower back pain, LE weakness and weight loss. Thoracic/lumbar spine MRI showed discitis osteomyelitis, septic lung emboli, and bilateral psoas muscle abscesses. Pharmacy is consulted to dose vancomycin and cefepime for infection.  - 10/27 CXR: Apparent pneumonia left base with small left pleural effusion  - 10/27 thoracic/lumbar spine MRI: Abnormal enhancement of the L4 and L5 vertebral bodies and disc space, consistent with discitis osteomyelitis; Bilateral psoas muscle abscesses; Abnormal signal and enhancement of the T1 vertebral body with surrounding paraspinal soft tissue enhancement, possibly indicating osteomyelitis. Multiple cavitary lesions within both lungs, consistent with septic emboli.  Today, 02/24/2019: - day #3 abx - Tmax 101.8, wbc elevated but down -  scr down 0.8 (crcl 100) - wbc elevated but improving - BAL. Blood cx, abscess cx now with staph aureus (sensitivities pending) - cardiology note on 10/30- plan for TEE when patient's clinical status improves   Plan: - Adjust vancomycin dose to 1000 mg IV q12h for for renal function (est AUC 501) - F/U renal function, cx data _______________________________________  Height: 6' (182.9 cm) Weight: 139 lb 8.8 oz (63.3 kg) IBW/kg (Calculated) : 77.6  Temp (24hrs), Avg:99 F (37.2 C), Min:97.5 F (36.4 C), Max:101.8 F (38.8 C)  Recent Labs  Lab 02/21/19 2013 02/21/19 2213 02/22/19 0533 02/22/19 1803 02/23/19 0802 02/24/19 0415  WBC 22.4*  --  20.3* 22.9* 13.9* 12.8*  CREATININE 1.77*  --  1.48* 1.23 1.25* 0.80  LATICACIDVEN 2.1* 1.1  --   --   --   --     Estimated Creatinine Clearance: 101.1 mL/min (by C-G formula based on SCr of 0.8 mg/dL).    Allergies  Allergen Reactions  . Penicillins Other (See Comments)   Antimicrobials this admission:  10/28 vanc>> 10/28 cefepime>>  Dose  adjustments this admission:  - 10/29 Inc Vanc to 757m IV q12h for improving renal function - 10/30 inc vanc to 1000 mg q12h  Microbiology results:  10/28 BCx x2: +SA (susceptibilities pending) 10/28 MRSA PCR: neg 10/28 right psoas abscess: moderate staph aureus 10/28 resp (BAL): moderate staph aureus  Thank you for allowing pharmacy to be a part of this patient's care.  PLynelle Doctor PharmD, BCPS 02/24/2019 10:05 AM

## 2019-02-24 NOTE — Progress Notes (Addendum)
Pharmacy Antibiotic Note  Franklin Anderson is a 48 y.o. male with hx heroin abuse presented to the ED on 02/21/2019 with c/o lower back pain, LE weakness and weight loss. Thoracic/lumbar spine MRI showed discitis osteomyelitis, septic lung emboli, and bilateral psoas muscle abscesses. Patient was started on vancomycin and cefepime for infection and then de-esc to vancomycin.  - 10/27 CXR: Apparent pneumonia left base with small left pleural effusion  - 10/27 thoracic/lumbar spine MRI: Abnormal enhancement of the L4 and L5 vertebral bodies and disc space, consistent with discitis osteomyelitis; Bilateral psoas muscle abscesses; Abnormal signal and enhancement of the T1 vertebral body with surrounding paraspinal soft tissue enhancement, possibly indicating osteomyelitis. Multiple cavitary lesions within both lungs, consistent with septic emboli.  Today, 02/24/2019: - day #3 abx - Tmax 101.8, wbc elevated but down -  scr down 0.8 (crcl 100) - wbc elevated but improving - BAL. Blood cx, abscess cx now with staph aureus (sensitivities pending) - cardiology note on 10/30- plan for TEE when patient's clinical status improves   Plan: - Adjust vancomycin dose to 1000 mg IV q12h for for renal function (est AUC 501) - F/U renal function, cx data _______________________________________  Height: 6' (182.9 cm) Weight: 139 lb 8.8 oz (63.3 kg) IBW/kg (Calculated) : 77.6  Temp (24hrs), Avg:99 F (37.2 C), Min:97.5 F (36.4 C), Max:101.8 F (38.8 C)  Recent Labs  Lab 02/21/19 2013 02/21/19 2213 02/22/19 0533 02/22/19 1803 02/23/19 0802 02/24/19 0415  WBC 22.4*  --  20.3* 22.9* 13.9* 12.8*  CREATININE 1.77*  --  1.48* 1.23 1.25* 0.80  LATICACIDVEN 2.1* 1.1  --   --   --   --     Estimated Creatinine Clearance: 101.1 mL/min (by C-G formula based on SCr of 0.8 mg/dL).    Allergies  Allergen Reactions  . Penicillins Other (See Comments)   Antimicrobials this admission:  10/28 vanc>> 10/28  cefepime>>10/29  Dose adjustments this admission:  - 10/29 Inc Vanc to 75m IV q12h for improving renal function - 10/30 inc vanc to 1000 mg q12h  Microbiology results:  10/28 BCx x2: +SA (susceptibilities pending) 10/28 MRSA PCR: neg 10/28 right psoas abscess: moderate staph aureus 10/28 resp (BAL): moderate staph aureus  Thank you for allowing pharmacy to be a part of this patient's care.  PLynelle Doctor PharmD, BCPS 02/24/2019 10:10 AM

## 2019-02-24 NOTE — Progress Notes (Signed)
   Ideal timing for TEE is once patient is able to be transferred to [facility] for evaluation.   Obviously, patient will be continued on IV abx per ID at this time.   Personally reviewed ECHO. EF is normal. Mitral regurgitation is mild. There is no emergent need for TEE at this time, nor any indication for emergent cardiothoracic valvular surgery at this time.    Discussed with cardiology consult team.  Candee Furbish, MD

## 2019-02-25 LAB — CULTURE, RESPIRATORY W GRAM STAIN

## 2019-02-25 LAB — COMPREHENSIVE METABOLIC PANEL
ALT: 28 U/L (ref 0–44)
AST: 23 U/L (ref 15–41)
Albumin: 1.4 g/dL — ABNORMAL LOW (ref 3.5–5.0)
Alkaline Phosphatase: 81 U/L (ref 38–126)
Anion gap: 8 (ref 5–15)
BUN: 64 mg/dL — ABNORMAL HIGH (ref 6–20)
CO2: 27 mmol/L (ref 22–32)
Calcium: 8.1 mg/dL — ABNORMAL LOW (ref 8.9–10.3)
Chloride: 113 mmol/L — ABNORMAL HIGH (ref 98–111)
Creatinine, Ser: 0.61 mg/dL (ref 0.61–1.24)
GFR calc Af Amer: >60 mL/min (ref >60)
GFR calc non Af Amer: >60 mL/min (ref >60)
Glucose, Bld: 138 mg/dL — ABNORMAL HIGH (ref 70–99)
Potassium: 4 mmol/L (ref 3.5–5.1)
Sodium: 148 mmol/L — ABNORMAL HIGH (ref 135–145)
Total Bilirubin: 0.6 mg/dL (ref 0.3–1.2)
Total Protein: 6.4 g/dL — ABNORMAL LOW (ref 6.5–8.1)

## 2019-02-25 LAB — GLUCOSE, CAPILLARY
Glucose-Capillary: 139 mg/dL — ABNORMAL HIGH (ref 70–99)
Glucose-Capillary: 146 mg/dL — ABNORMAL HIGH (ref 70–99)
Glucose-Capillary: 124 mg/dL — ABNORMAL HIGH (ref 70–99)
Glucose-Capillary: 134 mg/dL — ABNORMAL HIGH (ref 70–99)
Glucose-Capillary: 145 mg/dL — ABNORMAL HIGH (ref 70–99)
Glucose-Capillary: 124 mg/dL — ABNORMAL HIGH (ref 70–99)

## 2019-02-25 LAB — CBC
HCT: 28.4 % — ABNORMAL LOW (ref 39.0–52.0)
Hemoglobin: 8.8 g/dL — ABNORMAL LOW (ref 13.0–17.0)
MCH: 28.3 pg (ref 26.0–34.0)
MCHC: 31 g/dL (ref 30.0–36.0)
MCV: 91.3 fL (ref 80.0–100.0)
Platelets: 235 10*3/uL (ref 150–400)
RBC: 3.11 MIL/uL — ABNORMAL LOW (ref 4.22–5.81)
RDW: 15.7 % — ABNORMAL HIGH (ref 11.5–15.5)
WBC: 13.4 10*3/uL — ABNORMAL HIGH (ref 4.0–10.5)
nRBC: 0 % (ref 0.0–0.2)

## 2019-02-25 LAB — MAGNESIUM: Magnesium: 2.5 mg/dL — ABNORMAL HIGH (ref 1.7–2.4)

## 2019-02-25 MED ORDER — LABETALOL HCL 5 MG/ML IV SOLN
20.0000 mg | INTRAVENOUS | Status: AC | PRN
  Administered 2019-02-25 – 2019-02-27 (×10): 20 mg via INTRAVENOUS
  Filled 2019-02-25 (×11): qty 4

## 2019-02-25 NOTE — Progress Notes (Addendum)
NAME:  Franklin Anderson, MRN:  [id number], DOB:  [birth year], LOS: 3 ADMISSION DATE:  02/21/2019, CONSULTATION DATE:  02/22/19 REFERRING MD:  Reesa Chew, CHIEF COMPLAINT:  Respiratory failure   Brief History   48 year old man with hx of active IVDA presenting with septic shock and multiorgan dysfunction.  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  This evening became more obtunded and dyspneic.  PCCM consulted for further management.  History per chart review due to acuity of condition.  Past Medical History  HTN Drug abuse  Significant Hospital Events   10/28 admitted, intubated  Consults:  ID, cardiology, PCCM  Procedures:  10/28 R psoas abscess aspiration  Significant Diagnostic Tests:  MRI Brain IMPRESSION: Motion degraded study without evidence of septic emboli. Nonspecific thin dural thickening, which could be secondary to lumbar puncture if recently performed. Unremarkable proximal intracranial circulation.  CXR IMPRESSION: Worsening airspace opacity in the left lower lung which is concerning for pneumonia.  Again noted are multiple bilateral rounded pulmonary nodular opacities throughout both lungs. This could be due to septic emboli.  Echo  1. Left ventricular ejection fraction, by visual estimation, is 60 to 65%. The left ventricle has normal function. There is no left ventricular hypertrophy.  2. Left ventricular diastolic parameters are consistent with Grade I diastolic dysfunction (impaired relaxation).  3. Global right ventricle has normal systolic function.The right ventricular size is normal. No increase in right ventricular wall thickness.  4. Left atrial size was normal.  5. Right atrial size was normal.  6. Moderate pericardial effusion.  7. The pericardial effusion is localized near the  right ventricle and localized near the right atrium.  8. Mild RV diastolic collapse. No mitral or tricuspid inflow variation.  9. Mild thickening of the anterior mitral valve leaflet(s). 10. The mitral valve is abnormal. Mild mitral valve regurgitation. No evidence of mitral stenosis. 11. Anterior mitral valve leaflet is mildly thickened and hypermobile. Mitral regurgitation is noted in the subcostal 4 chamber view that is concerning for leaflet perforation rather than at a site of malcoaptation. In the setting of IVDA recommend TEE  to better evaluate. 12. The tricuspid valve is normal in structure. Tricuspid valve regurgitation is trivial. 13. The aortic valve is normal in structure. Aortic valve regurgitation is not visualized. No evidence of aortic valve sclerosis or stenosis. 14. The pulmonic valve was normal in structure. Pulmonic valve regurgitation is not visualized. 15. Normal pulmonary artery systolic pressure. 16. The inferior vena cava is normal in size with greater than 50% respiratory variability, suggesting right atrial pressure of 3 mmHg.  Micro Data:  2x Blood culture 10/27>> MSSA Wound culture 10/27>> MSSA  Antimicrobials:  10/28 >> 10/29  Cefepime Vanc  10/28 >> 10/30  Cefazolin 10/30>>  Interim history/subjective:  Doing well today.  Remains sedated on vent.  No issues per nursing.  Objective   Blood pressure 120/84, pulse 88, temperature 100 F (37.8 C), resp. rate (!) 23, height 6' (1.829 m), weight 63.1 kg, SpO2 96 %.    Vent Mode: PRVC FiO2 (%):  [30 %] 30 % Set Rate:  [16 bmp] 16 bmp Vt Set:  [450 mL] 450 mL PEEP:  [5 cmH20] 5 cmH20 Plateau Pressure:  [12 cmH20-17 cmH20] 15 cmH20   Intake/Output Summary (Last 24 hours) at 02/25/2019 0736 Last data filed at  02/25/2019 0600 Gross per 24 hour  Intake 2158.96 ml  Output 2150 ml  Net 8.96 ml   Filed Weights   02/23/19 0500 02/24/19 0500 02/25/19 0457  Weight: 63.2 kg 63.3 kg 63.1 kg    Examination:  General: ill appearing cachetic man on vent HENT: ETT in place with mild mucoid secretions on suctioning Lungs: better air movement today, occasional accessory muscle use Cardiovascular: RRR, minimal murmur, ext warm Abdomen: Soft, +BS Extremities: 1+ BL edema to thighs Neuro: Moves all 4 ext to command PSYCH: RASS 0   Uremia better Cr beter K a bit low, repleted Mg okay Very low albumin stable CBC stable Sugars okay  Resolved Hospital Problem list   # Acute kidney injury  Assessment & Plan:  # Acute hypoxemic respiratory failure due to inability to protect airway, mucus plugging, and severe sepsis. Improved. 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  No signs of refeeding with TF # Hypernatremia- will have to figure out if can take PO, otherwise may need some D5w although would prefer to avoid  - Wean to extubate, progressive mobility and swallow screen after - Cefazolin with duration TBD - Poor prognosis mostly related to deconditioning and what appears to be starvation; limited code, if ends up re-intubated will need more serious GOC discussion   Best practice:  Diet: hold TF for extubation Pain/Anxiety/Delirium protocol (if indicated): wean VAP protocol (if indicated): wean DVT prophylaxis: heparin GI prophylaxis: PPI Glucose control: SSI Mobility: BR Code Status: no chest compressions or shocks Disposition: ICU  The patient is  critically ill with multiple organ systems failure and requires high complexity decision making for assessment and support, frequent evaluation and titration of therapies, application of advanced monitoring technologies and extensive interpretation of multiple databases. Critical Care Time devoted to patient care services described in this note independent of APP/resident time (if applicable)  is 32 minutes.   Myrla Halsted MD [facility] 02/25/2019 7:36 AM Personal pager: [phone] If unanswered, please page CCM On-call: #[phone]

## 2019-02-25 NOTE — Progress Notes (Addendum)
Progress Note  Patient Name: Franklin Anderson Date of Encounter: 02/25/2019  Primary Cardiologist: New to [facility]; Dr. Percival Spanish  Subjective   Remains intubated, but alert and following commands.  Normal creatinine, BUN improving (88->64).  Has been hypertensive (up to 170/110s)  Inpatient Medications    Scheduled Meds: . chlorhexidine gluconate (MEDLINE KIT)  15 mL Mouth Rinse BID  . Chlorhexidine Gluconate Cloth  6 each Topical Daily  . feeding supplement (NEPRO CARB STEADY)  1,000 mL Per Tube Q24H  . feeding supplement (PRO-STAT SUGAR FREE 64)  30 mL Per Tube TID  . heparin injection (subcutaneous)  5,000 Units Subcutaneous Q8H  . insulin aspart  0-24 Units Subcutaneous Q4H  . ipratropium  0.5 mg Nebulization Q6H  . levalbuterol  1.25 mg Nebulization Q6H  . mouth rinse  15 mL Mouth Rinse 10 times per day  . pantoprazole sodium  40 mg Per Tube Daily  . sodium chloride flush  10-40 mL Intracatheter Q12H   Continuous Infusions: .  ceFAZolin (ANCEF) IV Stopped (02/25/19 7096)  . fentaNYL infusion INTRAVENOUS 175 mcg/hr (02/25/19 0600)  . propofol (DIPRIVAN) infusion 40 mcg/kg/min (02/25/19 0600)  . sodium chloride     PRN Meds: acetaminophen **OR** acetaminophen, acetaminophen, fentaNYL, HYDROmorphone (DILAUDID) injection, ipratropium-albuterol, labetalol, LORazepam, ondansetron **OR** ondansetron (ZOFRAN) IV, senna-docusate, sodium chloride flush   Vital Signs    Vitals:   02/25/19 0503 02/25/19 0512 02/25/19 0600 02/25/19 0630  BP: (!) 161/112 (!) 165/119 (!) 170/114 120/84  Pulse: (!) 111 (!) 114 91 88  Resp: (!) 24 (!) 31 (!) 23 (!) 23  Temp: 99.9 F (37.7 C) 99.9 F (37.7 C) 99.9 F (37.7 C) 100 F (37.8 C)  TempSrc:      SpO2: 96% 91% 96% 96%  Weight:      Height:        Intake/Output Summary (Last 24 hours) at 02/25/2019 0753 Last data filed at 02/25/2019 0600 Gross per 24 hour  Intake 2158.96 ml  Output 2150 ml  Net 8.96 ml   Filed Weights   02/23/19 0500 02/24/19 0500 02/25/19 0457  Weight: 63.2 kg 63.3 kg 63.1 kg    Telemetry    Sinus rhythm in 90s, PVCs, was sinus tachycardia up to 110s overnight - Personally Reviewed  ECG    No new tracings - Personally Reviewed  Physical Exam   GEN: Cachectic. Intubated but alert and follows commands Neck: No JVD, no carotid bruits Cardiac: tachycardic, regular rhythm, no murmurs, rubs, or gallops.  Respiratory: Scattered rhonchi, ventilated GI: Soft, nontender MS: No edema Neuro:  Nonfocal, moving all extremities spontaneously Psych: Normal affect   Labs    Chemistry Recent Labs  Lab 02/23/19 0802 02/24/19 0415 02/25/19 0440  NA 135 141 148*  K 3.8 3.4* 4.0  CL 103 108 113*  CO2 _0 GLUCOSE 130* 158* 138*  BUN 102* 88* 64*  CREATININE 1.25* 0.80 0.61  CALCIUM 7.8* 7.8* 8.1*  PROT 5.9* 6.2* 6.4*  ALBUMIN 1.5* 1.5* 1.4*  AST 39 26 23  ALT 47* 39 28  ALKPHOS 93 88 81  BILITOT 0.9 0.8 0.6  GFRNONAA >60 >60 >60  GFRAA >60 >60 >60  ANIONGAP _1 Hematology Recent Labs  Lab 02/23/19 0802 02/24/19 0415 02/25/19 0440  WBC 13.9* 12.8* 13.4*  RBC 2.94* 2.99* 3.11*  HGB 8.4* 8.5* 8.8*  HCT 26.5* 27.1* 28.4*  MCV 90.1 90.6 91.3  MCH 28.6 28.4 28.3  MCHC 31.7 31.4 31.0  RDW 15.5 15.5 15.7*  PLT 176 191 235    Cardiac EnzymesNo results for input(s): TROPONINI in the last 168 hours. No results for input(s): TROPIPOC in the last 168 hours.   BNP Recent Labs  Lab 02/23/19 0802  BNP 193.3*     DDimer  Recent Labs  Lab 02/22/19 1757 02/22/19 1850  DDIMER BLOOD/ANTICOAG RATIO IN TUBE UNSATISFACTORY 8.03*     Radiology    Dg Chest Port 1 View  Result Date: 02/24/2019 CLINICAL DATA:  Endotracheal tube EXAM: PORTABLE CHEST 1 VIEW COMPARISON:  Two days ago FINDINGS: Endotracheal tube tip is just below the clavicular heads. Left IJ line with tip at the upper cavoatrial junction. The orogastric tube tip reaches the stomach. Bilateral cavitary  pneumonia with small left pleural effusion. No convincing change from prior. No visible air leak. IMPRESSION: 1. Unremarkable hardware positioning. 2. Unchanged multiple pulmonary cavities with left lower lobe pneumonia and small pleural effusion, findings of septic emboli. Electronically Signed   By: Monte Fantasia M.D.   On: 02/24/2019 05:31    Cardiac Studies   Echocardiogram  1. Left ventricular ejection fraction, by visual estimation, is 60 to 65%. The left ventricle has normal function. There is no left ventricular hypertrophy. 2. Left ventricular diastolic parameters are consistent with Grade I diastolic dysfunction (impaired relaxation). 3. Global right ventricle has normal systolic function.The right ventricular size is normal. No increase in right ventricular wall thickness. 4. Left atrial size was normal. 5. Right atrial size was normal. 6. Moderate pericardial effusion. 7. The pericardial effusion is localized near the right ventricle and localized near the right atrium. 8. Mild RV diastolic collapse. No mitral or tricuspid inflow variation. 9. Mild thickening of the anterior mitral valve leaflet(s). 10. The mitral valve is abnormal. Mild mitral valve regurgitation. No evidence of mitral stenosis. 11. Anterior mitral valve leaflet is mildly thickened and hypermobile. Mitral regurgitation is noted in the subcostal 4 chamber view that is concerning for leaflet perforation rather than at a site of malcoaptation. In the setting of IVDA recommend TEE  to better evaluate. 12. The tricuspid valve is normal in structure. Tricuspid valve regurgitation is trivial. 13. The aortic valve is normal in structure. Aortic valve regurgitation is not visualized. No evidence of aortic valve sclerosis or stenosis. 14. The pulmonic valve was normal in structure. Pulmonic valve regurgitation is not visualized. 15. Normal pulmonary artery systolic pressure. 16. The inferior vena cava is normal in  size with greater than 50% respiratory variability, suggesting right atrial pressure of 3 mmHg.  Patient Profile     48 y.o. male with a PMH of IVDA, HTN, tobacco abuse, and rheumatic fever, who is being followed by cardiology for the evaluation of pericardial effusion and possible mitral valve vegetation  Assessment & Plan    Sepsis in the setting of IVDA: patient presented with back pain/LE weakness, found to have osteomyelitis/discitis of L1/L4/L5, psoas muscle abscess, and septic emboli. ID following - c/f infective endocarditis. Patient is on broad spectrum antibiotics. Currently intubated and sedated due to respiratory failure.  Will plan for TEE once patients status improved and he would be stable for transfer to [facility]. Continue IV antibiotics per primary team/ID  Pericardial effusion: moderate on echo 02/22/2019. No tamponade clinically, has been hypertensive  HTN: BP is up this morning, received IV labetalol x3 overnight   For questions or updates, please contact [place] Please consult [email or web address] for contact info under Cardiology/STEMI.  Signed, Donato Heinz, MD  02/25/2019, 7:53 AM   [phone]

## 2019-02-25 NOTE — Procedures (Signed)
Extubation Procedure Note  Patient Details:   Name: Franklin Anderson DOB: Jun 27, 1970 MRN: [id number]   Airway Documentation:  Airway 8 mm (Active)  Secured at (cm) 24 cm 02/25/19 0751  Measured From Lips 02/25/19 0751  Secured Location Right 02/25/19 0751  Secured By [employer] 02/25/19 0751  Tube Holder Repositioned Yes 02/25/19 0751  Cuff Pressure (cm H2O) 28 cm H2O 02/25/19 0751   Vent end date: 02/25/19 Vent end time: 0914   Evaluation  O2 sats: stable throughout Complications: No apparent complications Patient did tolerate procedure well. Bilateral Breath Sounds: Rhonchi   Yes    Patient extubated to 3L [place]; O2 sats currently 97%. Cuff leak present prior to extubation and no stridor noted. RT will continue to monitor patient and wean O2 as tolerated.   Lamonte Sakai 02/25/2019, 9:16 AM

## 2019-02-26 DIAGNOSIS — F1190 Opioid use, unspecified, uncomplicated: Secondary | ICD-10-CM

## 2019-02-26 DIAGNOSIS — B9561 Methicillin susceptible Staphylococcus aureus infection as the cause of diseases classified elsewhere: Secondary | ICD-10-CM

## 2019-02-26 LAB — GLUCOSE, CAPILLARY
Glucose-Capillary: 181 mg/dL — ABNORMAL HIGH (ref 70–99)
Glucose-Capillary: 87 mg/dL (ref 70–99)
Glucose-Capillary: 227 mg/dL — ABNORMAL HIGH (ref 70–99)
Glucose-Capillary: 333 mg/dL — ABNORMAL HIGH (ref 70–99)
Glucose-Capillary: 88 mg/dL (ref 70–99)

## 2019-02-26 LAB — CBC
HCT: 24.9 % — ABNORMAL LOW (ref 39.0–52.0)
Hemoglobin: 7.5 g/dL — ABNORMAL LOW (ref 13.0–17.0)
MCH: 27.9 pg (ref 26.0–34.0)
MCHC: 30.1 g/dL (ref 30.0–36.0)
MCV: 92.6 fL (ref 80.0–100.0)
Platelets: 243 10*3/uL (ref 150–400)
RBC: 2.69 MIL/uL — ABNORMAL LOW (ref 4.22–5.81)
RDW: 15.8 % — ABNORMAL HIGH (ref 11.5–15.5)
WBC: 11.1 10*3/uL — ABNORMAL HIGH (ref 4.0–10.5)
nRBC: 0 % (ref 0.0–0.2)

## 2019-02-26 LAB — BASIC METABOLIC PANEL
Anion gap: 6 (ref 5–15)
BUN: 69 mg/dL — ABNORMAL HIGH (ref 6–20)
CO2: 27 mmol/L (ref 22–32)
Calcium: 8.1 mg/dL — ABNORMAL LOW (ref 8.9–10.3)
Chloride: 113 mmol/L — ABNORMAL HIGH (ref 98–111)
Creatinine, Ser: 0.55 mg/dL — ABNORMAL LOW (ref 0.61–1.24)
GFR calc Af Amer: >60 mL/min (ref >60)
GFR calc non Af Amer: >60 mL/min (ref >60)
Glucose, Bld: 130 mg/dL — ABNORMAL HIGH (ref 70–99)
Potassium: 3.5 mmol/L (ref 3.5–5.1)
Sodium: 146 mmol/L — ABNORMAL HIGH (ref 135–145)

## 2019-02-26 LAB — TRIGLYCERIDES: Triglycerides: 60 mg/dL (ref <150)

## 2019-02-26 LAB — MAGNESIUM: Magnesium: 2.1 mg/dL (ref 1.7–2.4)

## 2019-02-26 MED ORDER — OXYCODONE-ACETAMINOPHEN 5-325 MG PO TABS
1.0000 | ORAL_TABLET | ORAL | Status: DC | PRN
  Administered 2019-02-26 – 2019-03-12 (×38): 1 via ORAL
  Filled 2019-02-26 (×41): qty 1

## 2019-02-26 MED ORDER — AMLODIPINE BESYLATE 5 MG PO TABS
5.0000 mg | ORAL_TABLET | Freq: Every day | ORAL | Status: DC
  Administered 2019-02-26 – 2019-02-27 (×2): 5 mg via ORAL
  Filled 2019-02-26 (×2): qty 1

## 2019-02-26 MED ORDER — POTASSIUM CHLORIDE 10 MEQ/50ML IV SOLN
10.0000 meq | INTRAVENOUS | Status: AC
  Administered 2019-02-26 (×4): 10 meq via INTRAVENOUS
  Filled 2019-02-26 (×4): qty 50

## 2019-02-26 MED ORDER — ORAL CARE MOUTH RINSE
15.0000 mL | Freq: Two times a day (BID) | OROMUCOSAL | Status: DC
  Administered 2019-02-26 – 2019-04-14 (×76): 15 mL via OROMUCOSAL

## 2019-02-26 MED ORDER — ENSURE ENLIVE PO LIQD
237.0000 mL | Freq: Two times a day (BID) | ORAL | Status: DC
  Administered 2019-02-26 – 2019-04-14 (×63): 237 mL via ORAL

## 2019-02-26 NOTE — Progress Notes (Signed)
NAME:  Franklin Anderson, MRN:  [id number], DOB:  [birth year], LOS: 4 ADMISSION DATE:  02/21/2019, CONSULTATION DATE:  02/22/19 REFERRING MD:  Reesa Chew, CHIEF COMPLAINT:  Respiratory failure   Brief History   48 year old man with hx of active IVDA presenting with septic shock and multiorgan dysfunction.  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  This evening became more obtunded and dyspneic.  PCCM consulted for further management.  History per chart review due to acuity of condition.  Past Medical History  HTN Drug abuse  Significant Hospital Events   10/28 admitted, intubated  Consults:  ID, cardiology, PCCM  Procedures:  10/28 R psoas abscess aspiration 10/28>>10/31 on vent  Significant Diagnostic Tests:  MRI Brain IMPRESSION: Motion degraded study without evidence of septic emboli. Nonspecific thin dural thickening, which could be secondary to lumbar puncture if recently performed. Unremarkable proximal intracranial circulation.  CXR IMPRESSION: Worsening airspace opacity in the left lower lung which is concerning for pneumonia.  Again noted are multiple bilateral rounded pulmonary nodular opacities throughout both lungs. This could be due to septic emboli.  Echo  1. Left ventricular ejection fraction, by visual estimation, is 60 to 65%. The left ventricle has normal function. There is no left ventricular hypertrophy.  2. Left ventricular diastolic parameters are consistent with Grade I diastolic dysfunction (impaired relaxation).  3. Global right ventricle has normal systolic function.The right ventricular size is normal. No increase in right ventricular wall thickness.  4. Left atrial size was normal.  5. Right atrial size was normal.  6. Moderate pericardial effusion.  7. The pericardial effusion is  localized near the right ventricle and localized near the right atrium.  8. Mild RV diastolic collapse. No mitral or tricuspid inflow variation.  9. Mild thickening of the anterior mitral valve leaflet(s). 10. The mitral valve is abnormal. Mild mitral valve regurgitation. No evidence of mitral stenosis. 11. Anterior mitral valve leaflet is mildly thickened and hypermobile. Mitral regurgitation is noted in the subcostal 4 chamber view that is concerning for leaflet perforation rather than at a site of malcoaptation. In the setting of IVDA recommend TEE  to better evaluate. 12. The tricuspid valve is normal in structure. Tricuspid valve regurgitation is trivial. 13. The aortic valve is normal in structure. Aortic valve regurgitation is not visualized. No evidence of aortic valve sclerosis or stenosis. 14. The pulmonic valve was normal in structure. Pulmonic valve regurgitation is not visualized. 15. Normal pulmonary artery systolic pressure. 16. The inferior vena cava is normal in size with greater than 50% respiratory variability, suggesting right atrial pressure of 3 mmHg.  Micro Data:  2x Blood culture 10/27>> MSSA Wound culture 10/27>> MSSA  Antimicrobials:  10/28 >> 10/29  Cefepime Vanc  10/28 >> 10/30  Cefazolin 10/30>>  Interim history/subjective:  Tolerated extubation.  Remains profoundly weak.  C/o back pain.  Objective   Blood pressure (!) 158/94, pulse 65, temperature (!) 97.2 F (36.2 C), temperature source Axillary, resp. rate (!) 23, height 6' (1.829 m), weight 63.3 kg, SpO2 100 %.    Vent Mode: PSV;CPAP FiO2 (%):  [30 %] 30 % PEEP:  [5 cmH20] 5 cmH20 Pressure Support:  [10 cmH20] 10 cmH20   Intake/Output Summary (Last 24 hours) at 02/26/2019 0704 Last data filed at 02/26/2019 0000 Gross per 24 hour  Intake 507.34 ml  Output 425 ml  Net 82.34 ml   Filed Weights   02/24/19 0500 02/25/19 0457 02/26/19 0424  Weight: 63.3 kg 63.1 kg 63.3 kg    Examination:  General: ill appearing cachetic man on vent HENT: temporal wasting, MMM Lungs: scattered rhonci, no accessory muscle use Cardiovascular: RRR, minimal murmur, ext warm Abdomen: Soft, +BS Extremities: 1+ BL edema to thighs Neuro: Moves all 4 ext to command PSYCH: RASS 0   H/h down a bit Sugars okay BMP pending  Resolved Hospital Problem list   # Acute kidney injury  Assessment & Plan:  # Acute hypoxemic respiratory failure due to inability to protect airway, mucus plugging, and severe sepsis. Improved. Now off vent. # Uremia; I do not have a great explanation for his degree of uremia other than mild MAHA and AKI, improved. 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  No signs of refeeding with TF # Hypernatremia- BMP today pending # HTN- related to pain vs. Primary, trial of amlodipine today  - Cefazolin with duration TBD - PT/OT, up to chair - Dietician consult - Appreciate ID, Cardiology input - Tylenol mild pain, percocet moderate pain, dilaudid severe pain - If needs TEE at some point, stable for this from my perspective - Transfer to stepdown, appreciate TR[facility] taking over as primary starting 11/2   Myrla Halsted MD [facility] 02/26/2019 7:04 AM Personal pager: [phone] If unanswered, please page CCM On-call: #[phone]

## 2019-02-26 NOTE — Progress Notes (Addendum)
Progress Note  Patient Name: Franklin Anderson Date of Encounter: 02/26/2019  Primary Cardiologist: New to [place]; Dr. Percival Spanish  Subjective   Extubated yesterday.  Denies any dyspnea.  Hypertensive up to 190/110 yesterday, received IV labeltalol 20 mg x5.  Hemoglobin decreased to 7.5 this morning (8.8 ->7.5)  Inpatient Medications    Scheduled Meds: . chlorhexidine gluconate (MEDLINE KIT)  15 mL Mouth Rinse BID  . Chlorhexidine Gluconate Cloth  6 each Topical Daily  . feeding supplement (ENSURE ENLIVE)  237 mL Oral BID BM  . feeding supplement (NEPRO CARB STEADY)  1,000 mL Per Tube Q24H  . feeding supplement (PRO-STAT SUGAR FREE 64)  30 mL Per Tube TID  . heparin injection (subcutaneous)  5,000 Units Subcutaneous Q8H  . insulin aspart  0-24 Units Subcutaneous Q4H  . ipratropium  0.5 mg Nebulization Q6H  . levalbuterol  1.25 mg Nebulization Q6H  . mouth rinse  15 mL Mouth Rinse 10 times per day  . pantoprazole sodium  40 mg Per Tube Daily  . sodium chloride flush  10-40 mL Intracatheter Q12H   Continuous Infusions: .  ceFAZolin (ANCEF) IV Stopped (02/26/19 0659)  . fentaNYL infusion INTRAVENOUS Stopped (02/25/19 0730)  . propofol (DIPRIVAN) infusion Stopped (02/25/19 0730)  . sodium chloride     PRN Meds: acetaminophen **OR** acetaminophen, acetaminophen, fentaNYL, HYDROmorphone (DILAUDID) injection, ipratropium-albuterol, labetalol, LORazepam, ondansetron **OR** ondansetron (ZOFRAN) IV, senna-docusate, sodium chloride flush   Vital Signs    Vitals:   02/26/19 0230 02/26/19 0400 02/26/19 0424 02/26/19 0500  BP: (!) 157/91   (!) 158/94  Pulse:      Resp: 19   (!) 23  Temp:  (!) 97.2 F (36.2 C)    TempSrc:  Axillary    SpO2: 100%   100%  Weight:   63.3 kg   Height:        Intake/Output Summary (Last 24 hours) at 02/26/2019 0726 Last data filed at 02/26/2019 0000 Gross per 24 hour  Intake 507.34 ml  Output 425 ml  Net 82.34 ml   Filed Weights   02/24/19  0500 02/25/19 0457 02/26/19 0424  Weight: 63.3 kg 63.1 kg 63.3 kg    Telemetry    Sinus rhythm in 90s, PVCs, was sinus tachycardia up to 110s overnight - Personally Reviewed  ECG    No new tracings - Personally Reviewed  Physical Exam   GEN: Cachectic. Intubated but alert and follows commands Neck: No JVD, no carotid bruits Cardiac: tachycardic, regular rhythm, no murmurs, rubs, or gallops.  Respiratory: Scattered rhonchi, ventilated GI: Soft, nontender MS: No edema Neuro:  Nonfocal, moving all extremities spontaneously Psych: Normal affect   Labs    Chemistry Recent Labs  Lab 02/23/19 0802 02/24/19 0415 02/25/19 0440  NA 135 141 148*  K 3.8 3.4* 4.0  CL 103 108 113*  CO2 _0 GLUCOSE 130* 158* 138*  BUN 102* 88* 64*  CREATININE 1.25* 0.80 0.61  CALCIUM 7.8* 7.8* 8.1*  PROT 5.9* 6.2* 6.4*  ALBUMIN 1.5* 1.5* 1.4*  AST 39 26 23  ALT 47* 39 28  ALKPHOS 93 88 81  BILITOT 0.9 0.8 0.6  GFRNONAA >60 >60 >60  GFRAA >60 >60 >60  ANIONGAP _1 Hematology Recent Labs  Lab 02/24/19 0415 02/25/19 0440 02/26/19 0222  WBC 12.8* 13.4* 11.1*  RBC 2.99* 3.11* 2.69*  HGB 8.5* 8.8* 7.5*  HCT 27.1* 28.4* 24.9*  MCV 90.6 91.3 92.6  MCH 28.4 28.3 27.9  MCHC 31.4 31.0 30.1  RDW 15.5 15.7* 15.8*  PLT 191 235 243    Cardiac EnzymesNo results for input(s): TROPONINI in the last 168 hours. No results for input(s): TROPIPOC in the last 168 hours.   BNP Recent Labs  Lab 02/23/19 0802  BNP 193.3*     DDimer  Recent Labs  Lab 02/22/19 1757 02/22/19 1850  DDIMER BLOOD/ANTICOAG RATIO IN TUBE UNSATISFACTORY 8.03*     Radiology    No results found.  Cardiac Studies   Echocardiogram  1. Left ventricular ejection fraction, by visual estimation, is 60 to 65%. The left ventricle has normal function. There is no left ventricular hypertrophy. 2. Left ventricular diastolic parameters are consistent with Grade I diastolic dysfunction (impaired relaxation).  3. Global right ventricle has normal systolic function.The right ventricular size is normal. No increase in right ventricular wall thickness. 4. Left atrial size was normal. 5. Right atrial size was normal. 6. Moderate pericardial effusion. 7. The pericardial effusion is localized near the right ventricle and localized near the right atrium. 8. Mild RV diastolic collapse. No mitral or tricuspid inflow variation. 9. Mild thickening of the anterior mitral valve leaflet(s). 10. The mitral valve is abnormal. Mild mitral valve regurgitation. No evidence of mitral stenosis. 11. Anterior mitral valve leaflet is mildly thickened and hypermobile. Mitral regurgitation is noted in the subcostal 4 chamber view that is concerning for leaflet perforation rather than at a site of malcoaptation. In the setting of IVDA recommend TEE  to better evaluate. 12. The tricuspid valve is normal in structure. Tricuspid valve regurgitation is trivial. 13. The aortic valve is normal in structure. Aortic valve regurgitation is not visualized. No evidence of aortic valve sclerosis or stenosis. 14. The pulmonic valve was normal in structure. Pulmonic valve regurgitation is not visualized. 15. Normal pulmonary artery systolic pressure. 16. The inferior vena cava is normal in size with greater than 50% respiratory variability, suggesting right atrial pressure of 3 mmHg.  Patient Profile     48 y.o. male with a PMH of IVDA, HTN, tobacco abuse, and rheumatic fever, who is being followed by cardiology for the evaluation of pericardial effusion and possible mitral valve vegetation  Assessment & Plan    Sepsis in the setting of IVDA: patient presented with back pain/LE weakness, found to have osteomyelitis/discitis of L1/L4/L5, psoas muscle abscess, and septic emboli. Blood cultures growing MSSA.  ID following - c/f infective endocarditis.    - Patient is extubated now and stable on minimal O2.  Can plan for TEE tomorrow,  will need to be with anesthesia.  The risks and benefits of transesophageal echocardiogram have been explained including risks of esophageal damage, perforation (1:10,000 risk), bleeding, pharyngeal hematoma as well as other potential complications associated with conscious sedation including aspiration, arrhythmia, respiratory failure and death. Alternatives to treatment were discussed, questions were answered. Patient is willing to proceed.   Anemia: Hgb 10.6 on admission, down to 7.5 this morning.  Given BUN elevation out of proportion to SCr, would consider upper GI bleed.  However, patient denies any recent bleeding and output from rectal tube shows no evidence of bleeding.  Continue to monitor  Pericardial effusion: moderate on echo 02/22/2019. No tamponade clinically, has been hypertensive  HTN: BP is elevated, has been getting IV labetalol.  Can restart home lisinopril once AKI resolved and taking PO  For questions or updates, please contact [facility] Please consult [email or web address] for contact info under Cardiology/STEMI.  Signed, Donato Heinz, MD  02/26/2019, 7:26 AM   [phone]

## 2019-02-26 NOTE — Progress Notes (Signed)
   Per Dr. Gardiner Rhyme, patient needs TEE tomorrow for bacteremia in setting of IVDA. He will need anesthesia. Will send message to CARDMASTER to see if this can be done tomorrow. Dr. Gardiner Rhyme consented patient during rounds today. Will go ahead and make NPO at midnight and place orders.  Darreld Mclean, PA-C 02/26/2019 3:33 PM

## 2019-02-27 DIAGNOSIS — J9621 Acute and chronic respiratory failure with hypoxia: Secondary | ICD-10-CM

## 2019-02-27 DIAGNOSIS — F1990 Other psychoactive substance use, unspecified, uncomplicated: Secondary | ICD-10-CM

## 2019-02-27 DIAGNOSIS — E43 Unspecified severe protein-calorie malnutrition: Secondary | ICD-10-CM

## 2019-02-27 DIAGNOSIS — M6258 Muscle wasting and atrophy, not elsewhere classified, other site: Secondary | ICD-10-CM

## 2019-02-27 LAB — GLUCOSE, CAPILLARY
Glucose-Capillary: 134 mg/dL — ABNORMAL HIGH (ref 70–99)
Glucose-Capillary: 91 mg/dL (ref 70–99)
Glucose-Capillary: 93 mg/dL (ref 70–99)
Glucose-Capillary: 97 mg/dL (ref 70–99)
Glucose-Capillary: 147 mg/dL — ABNORMAL HIGH (ref 70–99)

## 2019-02-27 LAB — CBC
HCT: 25.3 % — ABNORMAL LOW (ref 39.0–52.0)
Hemoglobin: 7.8 g/dL — ABNORMAL LOW (ref 13.0–17.0)
MCH: 28.4 pg (ref 26.0–34.0)
MCHC: 30.8 g/dL (ref 30.0–36.0)
MCV: 92 fL (ref 80.0–100.0)
Platelets: 252 10*3/uL (ref 150–400)
RBC: 2.75 MIL/uL — ABNORMAL LOW (ref 4.22–5.81)
RDW: 16 % — ABNORMAL HIGH (ref 11.5–15.5)
WBC: 12.3 10*3/uL — ABNORMAL HIGH (ref 4.0–10.5)
nRBC: 0 % (ref 0.0–0.2)

## 2019-02-27 LAB — AEROBIC/ANAEROBIC CULTURE W GRAM STAIN (SURGICAL/DEEP WOUND)

## 2019-02-27 LAB — BASIC METABOLIC PANEL
Anion gap: 7 (ref 5–15)
BUN: 48 mg/dL — ABNORMAL HIGH (ref 6–20)
CO2: 25 mmol/L (ref 22–32)
Calcium: 8.1 mg/dL — ABNORMAL LOW (ref 8.9–10.3)
Chloride: 109 mmol/L (ref 98–111)
Creatinine, Ser: 0.53 mg/dL — ABNORMAL LOW (ref 0.61–1.24)
GFR calc Af Amer: >60 mL/min (ref >60)
GFR calc non Af Amer: >60 mL/min (ref >60)
Glucose, Bld: 107 mg/dL — ABNORMAL HIGH (ref 70–99)
Potassium: 4 mmol/L (ref 3.5–5.1)
Sodium: 141 mmol/L (ref 135–145)

## 2019-02-27 LAB — MAGNESIUM: Magnesium: 1.7 mg/dL (ref 1.7–2.4)

## 2019-02-27 MED ORDER — ADULT MULTIVITAMIN W/MINERALS CH
1.0000 | ORAL_TABLET | Freq: Every day | ORAL | Status: DC
  Administered 2019-02-28 – 2019-04-14 (×46): 1 via ORAL
  Filled 2019-02-27 (×48): qty 1

## 2019-02-27 MED ORDER — SODIUM CHLORIDE 0.9 % IV SOLN
INTRAVENOUS | Status: DC
  Administered 2019-02-27: 08:00:00 via INTRAVENOUS

## 2019-02-27 MED ORDER — LABETALOL HCL 5 MG/ML IV SOLN
20.0000 mg | INTRAVENOUS | Status: DC | PRN
  Administered 2019-02-27 – 2019-02-28 (×2): 20 mg via INTRAVENOUS
  Filled 2019-02-27: qty 4

## 2019-02-27 MED ORDER — IPRATROPIUM BROMIDE 0.02 % IN SOLN
0.5000 mg | Freq: Two times a day (BID) | RESPIRATORY_TRACT | Status: DC
  Administered 2019-02-27 – 2019-03-02 (×6): 0.5 mg via RESPIRATORY_TRACT
  Filled 2019-02-27 (×6): qty 2.5

## 2019-02-27 MED ORDER — LEVALBUTEROL HCL 1.25 MG/0.5ML IN NEBU
1.2500 mg | INHALATION_SOLUTION | Freq: Three times a day (TID) | RESPIRATORY_TRACT | Status: DC
  Administered 2019-02-27 – 2019-02-28 (×2): 1.25 mg via RESPIRATORY_TRACT
  Filled 2019-02-27 (×2): qty 0.5

## 2019-02-27 NOTE — Progress Notes (Signed)
Mr. Hurlbut was not seen by our service today as TEE was planned. TEE was canceled as imaging physician was not comfortable proceeding with Hgb 7.8. Discussed with Dr. Earnest Conroy and there is no concern for bleeding. Hgb is stable. We will plan for TEE tomorrow morning at 10:30 AM. We appreciate hospital medicine service input and do apologize that TEE was not performed today. Please keep NPO at midnight.   [place] T. Audie Box, [place]  [address], [place], [place] [address] [phone]  4:44 PM

## 2019-02-27 NOTE — Progress Notes (Signed)
Subjective: No new complaints   Antibiotics:  Anti-infectives (From admission, onward)   Start     Dose/Rate Route Frequency Ordered Stop   02/24/19 2200  vancomycin (VANCOCIN) IVPB 1000 mg/200 mL premix  Status:  Discontinued     1,000 mg 200 mL/hr over 60 Minutes Intravenous Every 12 hours 02/24/19 1010 02/24/19 1253   02/24/19 1600  ceFAZolin (ANCEF) IVPB 2g/100 mL premix     2 g 200 mL/hr over 30 Minutes Intravenous Every 8 hours 02/24/19 1253     02/23/19 2200  vancomycin (VANCOCIN) IVPB 750 mg/150 ml premix  Status:  Discontinued     750 mg 150 mL/hr over 60 Minutes Intravenous Every 24 hours 02/23/19 0853 02/23/19 0909   02/23/19 2200  vancomycin (VANCOCIN) IVPB 750 mg/150 ml premix  Status:  Discontinued     750 mg 150 mL/hr over 60 Minutes Intravenous Every 12 hours 02/23/19 0909 02/24/19 1010   02/22/19 2200  vancomycin (VANCOCIN) IVPB 750 mg/150 ml premix  Status:  Discontinued     750 mg 150 mL/hr over 60 Minutes Intravenous Every 24 hours 02/22/19 0826 02/22/19 0827   02/22/19 2200  vancomycin (VANCOCIN) IVPB 1000 mg/200 mL premix  Status:  Discontinued     1,000 mg 200 mL/hr over 60 Minutes Intravenous Every 24 hours 02/22/19 0827 02/23/19 0853   02/22/19 0900  ceFEPIme (MAXIPIME) 2 g in sodium chloride 0.9 % 100 mL IVPB  Status:  Discontinued     2 g 200 mL/hr over 30 Minutes Intravenous Every 12 hours 02/22/19 0826 02/23/19 1135   02/21/19 2015  ceFEPIme (MAXIPIME) 2 g in sodium chloride 0.9 % 100 mL IVPB     2 g 200 mL/hr over 30 Minutes Intravenous  Once 02/21/19 2013 02/22/19 0031   02/21/19 2015  metroNIDAZOLE (FLAGYL) IVPB 500 mg     500 mg 100 mL/hr over 60 Minutes Intravenous  Once 02/21/19 2013 02/22/19 0031   02/21/19 2015  vancomycin (VANCOCIN) IVPB 1000 mg/200 mL premix     1,000 mg 200 mL/hr over 60 Minutes Intravenous  Once 02/21/19 2013 02/22/19 0200      Medications: Scheduled Meds: . amLODipine  5 mg Oral Daily  . Chlorhexidine  Gluconate Cloth  6 each Topical Daily  . feeding supplement (ENSURE ENLIVE)  237 mL Oral BID BM  . heparin injection (subcutaneous)  5,000 Units Subcutaneous Q8H  . insulin aspart  0-24 Units Subcutaneous Q4H  . ipratropium  0.5 mg Nebulization Q6H  . levalbuterol  1.25 mg Nebulization Q6H  . mouth rinse  15 mL Mouth Rinse BID  . multivitamin with minerals  1 tablet Oral Daily  . pantoprazole sodium  40 mg Per Tube Daily  . sodium chloride flush  10-40 mL Intracatheter Q12H   Continuous Infusions: . sodium chloride 30 mL/hr at 02/27/19 0800  .  ceFAZolin (ANCEF) IV Stopped (02/27/19 0558)  . sodium chloride     PRN Meds:.acetaminophen **OR** acetaminophen, acetaminophen, HYDROmorphone (DILAUDID) injection, ipratropium-albuterol, LORazepam, ondansetron **OR** ondansetron (ZOFRAN) IV, oxyCODONE-acetaminophen, senna-docusate, sodium chloride flush    Objective: Weight change:   Intake/Output Summary (Last 24 hours) at 02/27/2019 1300 Last data filed at 02/27/2019 1200 Gross per 24 hour  Intake 816.89 ml  Output 1775 ml  Net -958.11 ml   Blood pressure (!) 171/116, pulse 91, temperature 97.7 F (36.5 C), temperature source Oral, resp. rate 15, height 6' (1.829 m), weight 63.3 kg, SpO2 100 %. Temp:  [97.7  F (36.5 C)-98.4 F (36.9 C)] 97.7 F (36.5 C) (11/02 1200) Pulse Rate:  [91-117] 91 (11/02 1200) Resp:  [15-38] 15 (11/02 1200) BP: (147-198)/(92-116) 171/116 (11/02 1200) SpO2:  [93 %-100 %] 100 % (11/02 1200)  Physical Exam: General: Alert and awake, oriented x3,cachectic, facial wasting HEENT: anicteric sclera, EOMI CVStachyr rate, normal  No mgr Chest: , no wheezing, no respiratory distress, clear anteiorly Abdomen: soft non-distended,  Extremities: no edema Skin: no rashes Neuro: nonfocal  CBC:    BMET Recent Labs    02/26/19 0707 02/27/19 0538  NA 146* 141  K 3.5 4.0  CL 113* 109  CO2 27 25  GLUCOSE 130* 107*  BUN 69* 48*  CREATININE 0.55* 0.53*   CALCIUM 8.1* 8.1*     Liver Panel  Recent Labs    02/25/19 0440  PROT 6.4*  ALBUMIN 1.4*  AST 23  ALT 28  ALKPHOS 81  BILITOT 0.6       Sedimentation Rate No results for input(s): ESRSEDRATE in the last 72 hours. C-Reactive Protein No results for input(s): CRP in the last 72 hours.  Micro Results: Recent Results (from the past 720 hour(s))  Blood Culture (routine x 2)     Status: Abnormal   Collection Time: 02/21/19  8:13 PM   Specimen: BLOOD LEFT FOREARM  Result Value Ref Range Status   Specimen Description   Final    BLOOD LEFT FOREARM Performed at [facility], [address] [place], [place] [address]    Special Requests   Final    BOTTLES DRAWN AEROBIC AND ANAEROBIC Blood Culture results may not be optimal due to an inadequate volume of blood received in culture bottles Performed at [facility] [address], [place], [place] [address]    Culture  Setup Time   Final    GRAM POSITIVE COCCI IN BOTH AEROBIC AND ANAEROBIC BOTTLES CRITICAL RESULT CALLED TO, READ BACK BY AND VERIFIED WITH: PHARMD J LEGGE [id number] AT 1337 BY CM CRITICAL RESULT CALLED TO, READ BACK BY AND VERIFIED WITH: PHARMD E JACKSON [id number] AT 1357 BY CM Performed at [facility], [place] [address] [place], [place] [address]    Culture STAPHYLOCOCCUS AUREUS (A)  Final   Report Status 02/24/2019 FINAL  Final   Organism ID, Bacteria STAPHYLOCOCCUS AUREUS  Final      Susceptibility   Staphylococcus aureus - MIC*    CIPROFLOXACIN <=0.5 SENSITIVE Sensitive     ERYTHROMYCIN >=8 RESISTANT Resistant     GENTAMICIN <=0.5 SENSITIVE Sensitive     OXACILLIN 0.5 SENSITIVE Sensitive     TETRACYCLINE <=1 SENSITIVE Sensitive     VANCOMYCIN 1 SENSITIVE Sensitive     TRIMETH/SULFA <=10 SENSITIVE Sensitive     CLINDAMYCIN <=0.25 SENSITIVE Sensitive     RIFAMPIN <=0.5 SENSITIVE Sensitive     Inducible Clindamycin NEGATIVE Sensitive     * STAPHYLOCOCCUS AUREUS  SARS Coronavirus  2 by RT PCR (hospital order, performed in [facility] hospital lab) Nasopharyngeal Nasopharyngeal Swab     Status: None   Collection Time: 02/21/19  8:16 PM   Specimen: Nasopharyngeal Swab  Result Value Ref Range Status   SARS Coronavirus 2 NEGATIVE NEGATIVE Final    Comment: (NOTE) If result is NEGATIVE SARS-CoV-2 target nucleic acids are NOT DETECTED. The SARS-CoV-2 RNA is generally detectable in upper and lower  respiratory specimens during the acute phase of infection. The lowest  concentration of SARS-CoV-2 viral copies this assay can detect  is 250  copies / mL. A negative result does not preclude SARS-CoV-2 infection  and should not be used as the sole basis for treatment or other  patient management decisions.  A negative result may occur with  improper specimen collection / handling, submission of specimen other  than nasopharyngeal swab, presence of viral mutation(s) within the  areas targeted by this assay, and inadequate number of viral copies  (<250 copies / mL). A negative result must be combined with clinical  observations, patient history, and epidemiological information. If result is POSITIVE SARS-CoV-2 target nucleic acids are DETECTED. The SARS-CoV-2 RNA is generally detectable in upper and lower  respiratory specimens dur ing the acute phase of infection.  Positive  results are indicative of active infection with SARS-CoV-2.  Clinical  correlation with patient history and other diagnostic information is  necessary to determine patient infection status.  Positive results do  not rule out bacterial infection or co-infection with other viruses. If result is PRESUMPTIVE POSTIVE SARS-CoV-2 nucleic acids MAY BE PRESENT.   A presumptive positive result was obtained on the submitted specimen  and confirmed on repeat testing.  While 2019 novel coronavirus  (SARS-CoV-2) nucleic acids may be present in the submitted sample  additional confirmatory testing may be necessary for  epidemiological  and / or clinical management purposes  to differentiate between  SARS-CoV-2 and other Sarbecovirus currently known to infect humans.  If clinically indicated additional testing with an alternate test  methodology [phone]) is advised. The SARS-CoV-2 RNA is generally  detectable in upper and lower respiratory sp ecimens during the acute  phase of infection. The expected result is Negative. Fact Sheet for Patients:  StrictlyIdeas.no Fact Sheet for Healthcare Providers: BankingDealers.co.za This test is not yet approved or cleared by the [place] FDA and has been authorized for detection and/or diagnosis of SARS-CoV-2 by FDA under an Emergency Use Authorization (EUA).  This EUA will remain in effect (meaning this test can be used) for the duration of the COVID-19 declaration under Section 564(b)(1) of the Act, 21 U.S.C. section 360bbb-3(b)(1), unless the authorization is terminated or revoked sooner. Performed at [facility], [place] [address], [place], [place] [address]   MRSA PCR Screening     Status: None   Collection Time: 02/22/19  4:56 AM   Specimen: Nasal Mucosa; Nasopharyngeal  Result Value Ref Range Status   MRSA by PCR NEGATIVE NEGATIVE Final    Comment:        The GeneXpert MRSA Assay (FDA approved for NASAL specimens only), is one component of a comprehensive MRSA colonization surveillance program. It is not intended to diagnose MRSA infection nor to guide or monitor treatment for MRSA infections. Performed at [facility], [place] [address], [place], [place] [address]   Blood Culture (routine x 2)     Status: Abnormal   Collection Time: 02/22/19  5:33 AM   Specimen: BLOOD  Result Value Ref Range Status   Specimen Description   Final    BLOOD LEFT ANTECUBITAL Performed at [facility] [address] [place], [place] [address]    Special  Requests   Final    BOTTLES DRAWN AEROBIC AND ANAEROBIC Blood Culture adequate volume Performed at [facility] [address] [place], [place] [address]    Culture  Setup Time   Final    GRAM POSITIVE COCCI ANAEROBIC BOTTLE ONLY CRITICAL RESULT CALLED TO, READ BACK BY AND VERIFIED WITH: PHARMD E JACKSON  [id number] AT 1108 BY CM    Culture (A)  Final    STAPHYLOCOCCUS AUREUS SUSCEPTIBILITIES PERFORMED ON PREVIOUS CULTURE WITHIN THE LAST 5 DAYS. Performed at [facility], [place] [address], [place], [place] [address]    Report Status 02/24/2019 FINAL  Final  Aerobic/Anaerobic Culture (surgical/deep wound)     Status: None   Collection Time: 02/22/19 10:05 AM   Specimen: Abscess  Result Value Ref Range Status   Specimen Description   Final    ABSCESS RT PSOAS Performed at [facility] [address] [place], [place] [address]    Special Requests   Final    NONE Performed at [facility], [place] [address], [place], [place] [address]    Gram Stain   Final    RARE WBC PRESENT,BOTH PMN AND MONONUCLEAR FEW GRAM POSITIVE COCCI IN PAIRS    Culture   Final    MODERATE STAPHYLOCOCCUS AUREUS NO ANAEROBES ISOLATED Performed at [facility], [place] [address], [place], [place] [address]    Report Status 02/27/2019 FINAL  Final   Organism ID, Bacteria STAPHYLOCOCCUS AUREUS  Final      Susceptibility   Staphylococcus aureus - MIC*    CIPROFLOXACIN <=0.5 SENSITIVE Sensitive     ERYTHROMYCIN >=8 RESISTANT Resistant     GENTAMICIN <=0.5 SENSITIVE Sensitive     OXACILLIN 0.5 SENSITIVE Sensitive     TETRACYCLINE <=1 SENSITIVE Sensitive     VANCOMYCIN <=0.5 SENSITIVE Sensitive     TRIMETH/SULFA <=10 SENSITIVE Sensitive     CLINDAMYCIN <=0.25 SENSITIVE Sensitive     RIFAMPIN <=0.5 SENSITIVE Sensitive     Inducible Clindamycin NEGATIVE Sensitive     * MODERATE STAPHYLOCOCCUS AUREUS  Culture, respiratory (non-expectorated)      Status: None   Collection Time: 02/22/19  5:30 PM   Specimen: Bronchoalveolar Lavage; Respiratory  Result Value Ref Range Status   Specimen Description   Final    BRONCHIAL ALVEOLAR LAVAGE Performed at [facility] [address] [place], [place] [address]    Special Requests   Final    NONE Performed at [facility], [place] [address] [place], [place] [address]    Gram Stain   Final    ABUNDANT WBC PRESENT, PREDOMINANTLY PMN MODERATE GRAM POSITIVE COCCI Performed at [facility], [place] [address] [place], [place] [address]    Culture MODERATE STAPHYLOCOCCUS AUREUS  Final   Report Status 02/25/2019 FINAL  Final   Organism ID, Bacteria STAPHYLOCOCCUS AUREUS  Final      Susceptibility   Staphylococcus aureus - MIC*    CIPROFLOXACIN <=0.5 SENSITIVE Sensitive     ERYTHROMYCIN >=8 RESISTANT Resistant     GENTAMICIN <=0.5 SENSITIVE Sensitive     OXACILLIN 0.5 SENSITIVE Sensitive     TETRACYCLINE <=1 SENSITIVE Sensitive     VANCOMYCIN 1 SENSITIVE Sensitive     TRIMETH/SULFA <=10 SENSITIVE Sensitive     CLINDAMYCIN <=0.25 SENSITIVE Sensitive     RIFAMPIN <=0.5 SENSITIVE Sensitive     Inducible Clindamycin NEGATIVE Sensitive     * MODERATE STAPHYLOCOCCUS AUREUS  Culture, blood (Routine X 2) w Reflex to ID Panel     Status: None (Preliminary result)   Collection Time: 02/24/19 11:47 AM   Specimen: BLOOD LEFT HAND  Result Value Ref Range Status   Specimen Description   Final    BLOOD LEFT HAND Performed at [facility], [place] [address] [place], [place]  [address]    Special Requests   Final    BOTTLES DRAWN AEROBIC AND ANAEROBIC Blood Culture adequate volume Performed at [facility] [address] [place], [place] [address]    Culture   Final    NO GROWTH 3 DAYS Performed at [facility], [place] [address], [place], [place] [address]    Report Status PENDING  Incomplete    Studies/Results:  No results found.    Assessment/Plan:  INTERVAL HISTORY pt to get TEE today   Principal Problem:   Sepsis ([place]) Active Problems:   Heroin use   Endocarditis   Septic pulmonary embolism (HCC)   Osteomyelitis of thoracic spine (HCC)   Osteomyelitis of lumbar spine (HCC)   Acute encephalopathy   AKI (acute kidney injury) ([place])   Transaminitis   Psoas muscle abscess (HCC)   Pressure injury of skin   Mucus clot in bronchi   Protein-calorie malnutrition, severe   Abnormal echocardiogram   MSSA bacteremia    Franklin Anderson is a 48 y.o. 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  Specific aspects of clinical management are critical to optimizing the outcome of patients with SAB.  Therefore, the [facility] Antimicrobial Management Team [facility]) has initiated an intervention aimed at improving the management of SAB at [facility].  To do so, Infectious Diseases physicians are providing an evidence-based consult for the management of all patients with SAB.     Yes No Comments  Perform follow-up blood cultures (even if the patient is afebrile) to ensure clearance of bacteremia _0  _1  Need to repeat AGAIN post removal of central line  Remove vascular catheter and obtain follow-up blood cultures after the removal of the catheter _2  _3  CENTRA LINE NEEDS TO COME OUT and would do so after TEE today  Perform echocardiography to evaluate for endocarditis (transthoracic ECHO is 40-50% sensitive, TEE is > 90% sensitive) _4  _5  TEE pending  Consult electrophysiologist to evaluate implanted cardiac device (pacemaker, ICD) _6  _7    Ensure source control _8  _9   Need to monitor Neuro exam  Investigate for "metastatic" sites of infection _10  _11  Does the patient have ANY symptom or physical exam finding that would suggest a deeper infection (back or neck pain that may be suggestive of vertebral osteomyelitis or epidural abscess, muscle pain that could be a symptom of pyomyositis)?  Keep in mind that for deep seeded infections MRI imaging with contrast is preferred rather than other often insensitive tests such as plain x-rays, especially early in a patient's presentation.  Change antibiotic therapy to cefazolin _12  _13  Beta-lactam antibiotics are preferred for MSSA due to higher cure rates.   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

## 2019-02-27 NOTE — Evaluation (Signed)
Occupational Therapy Evaluation Patient Details Name: Franklin Anderson MRN: [id number] DOB: Apr 02, 1971 Today's Date: 02/27/2019    History of Present Illness Pt is 48 y/o M with PMH: IVDA (heroine), who presented with worsening back pain and B LE weakness. 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 Pt was intubated 10/28, extubated 10/31. On IV abx. Scheduled for TEE 11/2.   Clinical Impression   Pt seen for OT evaluation this date. Prior to hospital admission, pt was Indep with all aspects of self care and IADLs.  Pt lives alone and does not drive, but states he does have access to transportation if needed.  Currently pt demonstrates impairments in global strength and tolerance requiring RW and CGA x2 for ADL transfers and mobility for both line mgt and safety. In addition, pt requiring MAX A for LB dressing and unable to safely perform standing self care. Pt would benefit from skilled OT to address noted impairments and functional limitations (see below for any additional details) in order to maximize safety and independence while minimizing falls risk and caregiver burden.  Pt's d/c dispo TBD (see below for details).    Follow Up Recommendations  Supervision/Assistance - 24 hour;Other (comment)(pt inquires about opiate rehabilitation upon d/c from acute. Need for therapy f/u after acute hospitalization TBD based on medical and fxl recovery.)    Equipment Recommendations  Other (comment)(TBD once most appropriate next venue of care determined.)    Recommendations for Other Services       Precautions / Restrictions Precautions Precautions: Fall Precaution Comments: BP high-monitor, FMS tubing and catheter in place in addition to multiple lines. Restrictions Weight Bearing Restrictions: No      Mobility Bed Mobility Overal bed mobility: Modified Independent;Needs  Assistance Bed Mobility: Supine to Sit     Supine to sit: Supervision;Min guard;+2 for safety/equipment(extended time required and MIN verbal cues for sequence and hand/foot placement.)        Transfers Overall transfer level: Needs assistance Equipment used: Rolling walker (2 wheeled) Transfers: Sit to/from Stand Sit to Stand: Supervision;Min guard;+2 safety/equipment         General transfer comment: Increased time required and pt attempted sit to stand x2 trials before successfully coming to stand, requires monitoring of lines/tubes throughout. Requires MIN verbal cues for safe hand/foot placement.    Balance Overall balance assessment: Needs assistance Sitting-balance support: Bilateral upper extremity supported Sitting balance-Leahy Scale: Fair Sitting balance - Comments: use of UEs to maintain seated balance   Standing balance support: Bilateral upper extremity supported Standing balance-Leahy Scale: Fair Standing balance comment: CGA +2 for line mgt and safety in static standing. No overy LOB, but notable shaking/weakness and heavy use of UEs through RW.                           ADL either performed or assessed with clinical judgement   ADL Overall ADL's : Needs assistance/impaired Eating/Feeding: Set up;Sitting Eating/Feeding Details (indicate cue type and reason): NPO at this time before TEE, but given cup of water to rinse mouth, extended time required and notable UE shaking/weakness. Grooming: Therapist, nutritionalWash/dry face;Set up;Sitting   Upper Body Bathing: Minimal assistance;Sitting Upper Body Bathing Details (indicate cue type and reason): based on clinical observation Lower Body Bathing: Maximal assistance;Sit to/from stand Lower Body Bathing Details (indicate cue type and reason): based on clinical observation Upper Body Dressing : Minimal assistance;Sitting Upper Body Dressing Details (  indicate cue type and reason): based on clinical observation. Lower Body  Dressing: Maximal assistance;Sit to/from stand Lower Body Dressing Details (indicate cue type and reason): pt attempts to initiate donning socks in long sitting in bed, but gross weakness demo'ed with difficulty grasping sock. Pt able to pull up once OT has sock over heel. Note-some LE swelling increasing task difficulty as well. Toilet Transfer: Min guard;+2 for Careers information officer Details (indicate cue type and reason): based on clinical observation of SPS from bed to chair. Toileting- Clothing Manipulation and Hygiene: Min guard;Minimal assistance;+2 for physical assistance;+2 for safety/equipment;Sit to/from stand Toileting - Clothing Manipulation Details (indicate cue type and reason): based on clinical observation, pt with FMS tubing and catheter in place at this time.   Tub/Shower Transfer Details (indicate cue type and reason): NA Functional mobility during ADLs: Min guard;+2 for physical assistance;+2 for safety/equipment;Rolling walker(to take 3-4 shuffling L lateral side steps from bed to chair and then 3-4 BKWD steps toward chair.)       Vision Baseline Vision/History: No visual deficits Patient Visual Report: No change from baseline       Perception     Praxis      Pertinent Vitals/Pain Pain Assessment: 0-10 Pain Score: 8  Pain Location: middle and lower back Pain Descriptors / Indicators: Aching;Guarding Pain Intervention(s): Limited activity within patient's tolerance;Monitored during session;Repositioned;Patient requesting pain meds-RN notified     Hand Dominance Right   Extremity/Trunk Assessment Upper Extremity Assessment Upper Extremity Assessment: Generalized weakness;RUE deficits/detail;LUE deficits/detail RUE Deficits / Details: shoulder/elbow/wrist grossly 3/5, but noted shaking with fxl activities and pt report of fatigue. Grips 3+/5 LUE Deficits / Details: shoulder/elbow/wrist grossly 3/5, but noted shaking with fxl activities and pt report  of fatigue. Grips 3+/5   Lower Extremity Assessment Lower Extremity Assessment: Defer to PT evaluation;Generalized weakness   Cervical / Trunk Assessment Cervical / Trunk Assessment: Normal   Communication Communication Communication: No difficulties   Cognition Arousal/Alertness: Awake/alert Behavior During Therapy: WFL for tasks assessed/performed Overall Cognitive Status: Within Functional Limits for tasks assessed                                     General Comments       Exercises Other Exercises Other Exercises: OT facilitates education re: role of OT in this setting, d/c recommendations, potential need for therapy f/u after d/c from acute setting. Pt is agreeable, but also expresses concerns about recieving adequate "drug rehab" Other Exercises: OT facilitates education re: safe hand placement relative to RW for ADL transfers and mobility. Pt demos understnading. Requires f/u.   Shoulder Instructions      Home Living Family/patient expects to be discharged to:: Unsure(pt likely to need some skilled therapy rehabilitation, but is also looking at opiate rehabilitation at this time.) Living Arrangements: Alone(was living with mother and grandma and acting as caregiver over last 2 years. Lost both within a matter of 7 months per his report. That residence has a full flight of steps to enter.)                                      Prior Functioning/Environment Level of Independence: Independent        Comments: has RW and SPC, but was not using either AD.        OT Problem List:  Decreased strength;Decreased activity tolerance;Impaired balance (sitting and/or standing);Decreased knowledge of use of DME or AE;Decreased knowledge of precautions;Cardiopulmonary status limiting activity;Pain      OT Treatment/Interventions: Self-care/ADL training;Therapeutic exercise;Energy conservation;DME and/or AE instruction;Therapeutic  activities;Patient/family education;Balance training    OT Goals(Current goals can be found in the care plan section) Acute Rehab OT Goals Patient Stated Goal: To get stronger and make better decisions OT Goal Formulation: With patient Time For Goal Achievement: 03/13/19 Potential to Achieve Goals: Good  OT Frequency: Min 2X/week   Barriers to D/C: Decreased caregiver support  unsure of d/c destination at this time. Pt seems unsure of ability to return to previous environment. Agreeable to drug and therapy rehabilitation.       Co-evaluation PT/OT/SLP Co-Evaluation/Treatment: Yes Reason for Co-Treatment: Complexity of the patient's impairments (multi-system involvement) PT goals addressed during session: Mobility/safety with mobility OT goals addressed during session: ADL's and self-care      AM-PAC OT "6 Clicks" Daily Activity     Outcome Measure Help from another person eating meals?: None Help from another person taking care of personal grooming?: A Little Help from another person toileting, which includes using toliet, bedpan, or urinal?: A Little Help from another person bathing (including washing, rinsing, drying)?: A Little Help from another person to put on and taking off regular upper body clothing?: A Little Help from another person to put on and taking off regular lower body clothing?: A Lot 6 Click Score: 18   End of Session Equipment Utilized During Treatment: Rolling walker Nurse Communication: Mobility status;Patient requests pain meds  Activity Tolerance: Patient tolerated treatment well Patient left: in chair;with call bell/phone within reach;with chair alarm set  OT Visit Diagnosis: Unsteadiness on feet (R26.81);Muscle weakness (generalized) (M62.81)                Time: 4854-6270 OT Time Calculation (min): 27 min Charges:  OT General Charges $OT Visit: 1 Visit OT Evaluation $OT Eval Moderate Complexity: 1 Mod  Gerrianne Scale, MS, OTR/L Pager  [phone] 02/27/19, 11:42 AM

## 2019-02-27 NOTE — H&P (View-Only) (Signed)
Mr. Hallisey was not seen by our service today as TEE was planned. TEE was canceled as imaging physician was not comfortable proceeding with Hgb 7.8. Discussed with Dr. Kamineni and there is no concern for bleeding. Hgb is stable. We will plan for TEE tomorrow morning at 10:30 AM. We appreciate hospital medicine service input and do apologize that TEE was not performed today. Please keep NPO at midnight.   Skykomish T. O'Neal, MD [place]  [facility]  [address], [address] [place], [place] [address] [phone]  4:44 PM  

## 2019-02-27 NOTE — Progress Notes (Addendum)
PROGRESS NOTE    Franklin Anderson  LEX:[id number]  DOB: [birth year]  DOA: 02/21/2019 PCP: Mack Hook, MD  Brief Narrative:  48 y.o. male with medical history significant of rheumatic fever, HTN, ongoing IV heroin abuse, presented to the ED on 10/27 with 3 week course of progressively worsening back pain and BLE weakness associated with some bowel incontinence and trouble walking. Last use of heroin was on the day of admission. ED Course: Found to have:sepsis with Tm 100, HR 126, RR 31, WBC 22.4k,AKI with BUN 111 and creat 1.7. Imaging studies revealed osteomyelitis and diskitis of L4-L5,septic pulmonary emboli and B psoas abscesses. Patient recieved 2L NS bolus, put on cefepime / vanc / flagyl initially, admitted to [facility] for Mx of sepsis and associated metabolic encephalopathy.2x Blood culture 10/27>> MSSA. Patient became more obtunded and dyspneic on 10/28 , PCCM consulted-intubated --extubated on 10/31. Patient underwent R psoas abscess aspiration on 10/28. Wound culture>> MSSA.  Respiratory culture from 10/28 also growing MSSA.  Echo revealed Grade 1 diastolic dysfunction, preserved EF and possible mitral valve vegetation. TEE recommended. CXR suggestive of septic emboli and MRI head showed abnormal dural enhancement. ID/Cardiology following. HC additionally complicated by hypernatremia, persistent uremia, pericardial effusion (did not look too bad on bedside US 10/29 per PCCM) and severe deconditioning. Abx course--Cefepime10/28 >> 10/29 ,Vanc  10/28 >> 10/30 Cefazolin 10/30>>  Subjective:  Patient resting comfortably.  Currently on 3 L nasal cannula O2 and saturating well.  Plan for TEE today and is NPO.  Blood pressure elevated  Objective: Vitals:   02/27/19 0700 02/27/19 0745 02/27/19 0800 02/27/19 0817  BP:   (!) 166/99 (!) 166/99  Pulse:   (!) 104   Resp:   (!) 33   Temp: 98.4 F (36.9 C)     TempSrc: Oral     SpO2:  96% 93%   Weight:      Height:         Intake/Output Summary (Last 24 hours) at 02/27/2019 1122 Last data filed at 02/27/2019 0800 Gross per 24 hour  Intake 696.89 ml  Output 1650 ml  Net -953.11 ml   Filed Weights   02/24/19 0500 02/25/19 0457 02/26/19 0424  Weight: 63.3 kg 63.1 kg 63.3 kg    Physical Examination:  General exam: Appears comfortable and in no respiratory distress Respiratory system: Clear to auscultation. Respiratory effort normal. Cardiovascular system: S1 & S2 heard, RRR. No JVD, murmurs, rubs, gallops or clicks. No pedal edema. Gastrointestinal system: Abdomen is nondistended, soft and nontender. No organomegaly or masses felt. Normal bowel sounds heard. Central nervous system: Alert and oriented. No focal neurological deficits. Extremities: Symmetric 5 x 5 power. Skin: No rashes, lesions or ulcers Psychiatry: Judgement and insight appear normal. Mood & affect irritable due to n.p.o. status    Data Reviewed: I have personally reviewed following labs and imaging studies  CBC: Recent Labs  Lab 02/21/19 2013  02/23/19 0802 02/24/19 0415 02/25/19 0440 02/26/19 0222 02/27/19 0538  WBC 22.4*   < > 13.9* 12.8* 13.4* 11.1* 12.3*  NEUTROABS 20.6*  --   --   --   --   --   --   HGB 10.6*   < > 8.4* 8.5* 8.8* 7.5* 7.8*  HCT 32.1*   < > 26.5* 27.1* 28.4* 24.9* 25.3*  MCV 85.8   < > 90.1 90.6 91.3 92.6 92.0  PLT 263   < > 176 191 235 243 252   < > = values in this  interval not displayed.   Basic Metabolic Panel: Recent Labs  Lab 02/22/19 1725  02/23/19 0802 02/23/19 1700 02/24/19 0415 02/25/19 0440 02/26/19 0222 02/26/19 0707 02/27/19 0538  NA  --    < > 135  --  141 148*  --  146* 141  K  --    < > 3.8  --  3.4* 4.0  --  3.5 4.0  CL  --    < > 103  --  108 113*  --  113* 109  CO2  --    < > 24  --  26 27  --  27 25  GLUCOSE  --    < > 130*  --  158* 138*  --  130* 107*  BUN  --    < > 102*  --  88* 64*  --  69* 48*  CREATININE  --    < > 1.25*  --  0.80 0.61  --  0.55* 0.53*  CALCIUM   --    < > 7.8*  --  7.8* 8.1*  --  8.1* 8.1*  MG 2.8*  --  3.2* 3.1* 2.8* 2.5* 2.1  --  1.7  PHOS 5.8*  --  6.6* 4.5 3.6  --   --   --   --    < > = values in this interval not displayed.   GFR: Estimated Creatinine Clearance: 101.1 mL/min (A) (by C-G formula based on SCr of 0.53 mg/dL (L)). Liver Function Tests: Recent Labs  Lab 02/22/19 0533 02/22/19 1803 02/23/19 0802 02/24/19 0415 02/25/19 0440  AST 72* 74* 39 26 23  ALT 68* 65* 47* 39 28  ALKPHOS 137* 121 93 88 81  BILITOT 1.8* 1.7* 0.9 0.8 0.6  PROT 6.4* 6.5 5.9* 6.2* 6.4*  ALBUMIN 1.7* 1.7* 1.5* 1.5* 1.4*   No results for input(s): LIPASE, AMYLASE in the last 168 hours. No results for input(s): AMMONIA in the last 168 hours. Coagulation Profile: Recent Labs  Lab 02/21/19 2013 02/22/19 1850  INR 1.4* 1.7*   Cardiac Enzymes: No results for input(s): CKTOTAL, CKMB, CKMBINDEX, TROPONINI in the last 168 hours. BNP (last 3 results) No results for input(s): PROBNP in the last 8760 hours. HbA1C: No results for input(s): HGBA1C in the last 72 hours. CBG: Recent Labs  Lab 02/26/19 1555 02/26/19 1950 02/26/19 2310 02/27/19 0405 02/27/19 0744  GLUCAP 227* 333* 88 134* 91   Lipid Profile: Recent Labs    02/26/19 0222  TRIG 60   Thyroid Function Tests: No results for input(s): TSH, T4TOTAL, FREET4, T3FREE, THYROIDAB in the last 72 hours. Anemia Panel: No results for input(s): VITAMINB12, FOLATE, FERRITIN, TIBC, IRON, RETICCTPCT in the last 72 hours. Sepsis Labs: Recent Labs  Lab 02/21/19 2013 02/21/19 2213  LATICACIDVEN 2.1* 1.1    Recent Results (from the past 240 hour(s))  Blood Culture (routine x 2)     Status: Abnormal   Collection Time: 02/21/19  8:13 PM   Specimen: BLOOD LEFT FOREARM  Result Value Ref Range Status   Specimen Description   Final    BLOOD LEFT FOREARM Performed at [facility], [place] [address], [place], [place] [address]    Special Requests   Final    BOTTLES DRAWN AEROBIC  AND ANAEROBIC Blood Culture results may not be optimal due to an inadequate volume of blood received in culture bottles Performed at [place] [address] [place], [place] [address]    Culture  Setup Time   Final    GRAM POSITIVE COCCI IN BOTH AEROBIC AND ANAEROBIC BOTTLES CRITICAL RESULT CALLED TO, READ BACK BY AND VERIFIED WITH: PHARMD J LEGGE [id number] AT 1337 BY CM CRITICAL RESULT CALLED TO, READ BACK BY AND VERIFIED WITH: PHARMD E JACKSON [id number] AT 1357 BY CM Performed at [facility], [place] [address] [place], [place] [address]    Culture STAPHYLOCOCCUS AUREUS (A)  Final   Report Status 02/24/2019 FINAL  Final   Organism ID, Bacteria STAPHYLOCOCCUS AUREUS  Final      Susceptibility   Staphylococcus aureus - MIC*    CIPROFLOXACIN <=0.5 SENSITIVE Sensitive     ERYTHROMYCIN >=8 RESISTANT Resistant     GENTAMICIN <=0.5 SENSITIVE Sensitive     OXACILLIN 0.5 SENSITIVE Sensitive     TETRACYCLINE <=1 SENSITIVE Sensitive     VANCOMYCIN 1 SENSITIVE Sensitive     TRIMETH/SULFA <=10 SENSITIVE Sensitive     CLINDAMYCIN <=0.25 SENSITIVE Sensitive     RIFAMPIN <=0.5 SENSITIVE Sensitive     Inducible Clindamycin NEGATIVE Sensitive     * STAPHYLOCOCCUS AUREUS  SARS Coronavirus 2 by RT PCR (hospital order, performed in [facility] hospital lab) Nasopharyngeal Nasopharyngeal Swab     Status: None   Collection Time: 02/21/19  8:16 PM   Specimen: Nasopharyngeal Swab  Result Value Ref Range Status   SARS Coronavirus 2 NEGATIVE NEGATIVE Final    Comment: (NOTE) If result is NEGATIVE SARS-CoV-2 target nucleic acids are NOT DETECTED. The SARS-CoV-2 RNA is generally detectable in upper and lower  respiratory specimens during the acute phase of infection. The lowest  concentration of SARS-CoV-2 viral copies this assay can detect is 250  copies / mL. A negative result does not preclude SARS-CoV-2 infection  and should not be used as the sole basis for treatment or other   patient management decisions.  A negative result may occur with  improper specimen collection / handling, submission of specimen other  than nasopharyngeal swab, presence of viral mutation(s) within the  areas targeted by this assay, and inadequate number of viral copies  (<250 copies / mL). A negative result must be combined with clinical  observations, patient history, and epidemiological information. If result is POSITIVE SARS-CoV-2 target nucleic acids are DETECTED. The SARS-CoV-2 RNA is generally detectable in upper and lower  respiratory specimens dur ing the acute phase of infection.  Positive  results are indicative of active infection with SARS-CoV-2.  Clinical  correlation with patient history and other diagnostic information is  necessary to determine patient infection status.  Positive results do  not rule out bacterial infection or co-infection with other viruses. If result is PRESUMPTIVE POSTIVE SARS-CoV-2 nucleic acids MAY BE PRESENT.   A presumptive positive result was obtained on the submitted specimen  and confirmed on repeat testing.  While 2019 novel coronavirus  (SARS-CoV-2) nucleic acids may be present in the submitted sample  additional confirmatory testing may be necessary for epidemiological  and / or clinical management purposes  to differentiate between  SARS-CoV-2 and other Sarbecovirus currently known to infect humans.  If clinically indicated additional testing with an alternate test  methodology [id number]) is advised. The SARS-CoV-2 RNA is generally  detectable in upper and lower respiratory sp ecimens during the acute  phase of infection. The expected result is Negative. Fact Sheet for Patients:  StrictlyIdeas.no Fact Sheet for Healthcare Providers: BankingDealers.co.za This test is not yet approved or cleared by the [place] FDA and has been authorized  for detection and/or diagnosis of SARS-CoV-2 by  FDA under an Emergency Use Authorization (EUA).  This EUA will remain in effect (meaning this test can be used) for the duration of the COVID-19 declaration under Section 564(b)(1) of the Act, 21 U.S.C. section 360bbb-3(b)(1), unless the authorization is terminated or revoked sooner. Performed at [facility], [place] [address], [place], [place] [address]   MRSA PCR Screening     Status: None   Collection Time: 02/22/19  4:56 AM   Specimen: Nasal Mucosa; Nasopharyngeal  Result Value Ref Range Status   MRSA by PCR NEGATIVE NEGATIVE Final    Comment:        The GeneXpert MRSA Assay (FDA approved for NASAL specimens only), is one component of a comprehensive MRSA colonization surveillance program. It is not intended to diagnose MRSA infection nor to guide or monitor treatment for MRSA infections. Performed at [facility], [place] [address] [place], [place] [address]   Blood Culture (routine x 2)     Status: Abnormal   Collection Time: 02/22/19  5:33 AM   Specimen: BLOOD  Result Value Ref Range Status   Specimen Description   Final    BLOOD LEFT ANTECUBITAL Performed at [place] [address], [place], [place] [address]    Special Requests   Final    BOTTLES DRAWN AEROBIC AND ANAEROBIC Blood Culture adequate volume Performed at [place] [address] [place], [place] [address]    Culture  Setup Time   Final    GRAM POSITIVE COCCI ANAEROBIC BOTTLE ONLY CRITICAL RESULT CALLED TO, READ BACK BY AND VERIFIED WITH: PHARMD E JACKSON [id number] AT 1108 BY CM    Culture (A)  Final    STAPHYLOCOCCUS AUREUS SUSCEPTIBILITIES PERFORMED ON PREVIOUS CULTURE WITHIN THE LAST 5 DAYS. Performed at [facility], [place] [address], [place], [place] [address]    Report Status 02/24/2019 FINAL  Final  Aerobic/Anaerobic Culture (surgical/deep wound)     Status: None   Collection Time: 02/22/19 10:05 AM    Specimen: Abscess  Result Value Ref Range Status   Specimen Description   Final    ABSCESS RT PSOAS Performed at [place] [address], [place], [place] [address]    Special Requests   Final    NONE Performed at [facility], [place] [address] [place], [place] [address]    Gram Stain   Final    RARE WBC PRESENT,BOTH PMN AND MONONUCLEAR FEW GRAM POSITIVE COCCI IN PAIRS    Culture   Final    MODERATE STAPHYLOCOCCUS AUREUS NO ANAEROBES ISOLATED Performed at [facility], [place] [address], [place], [place] [address]    Report Status 02/27/2019 FINAL  Final   Organism ID, Bacteria STAPHYLOCOCCUS AUREUS  Final      Susceptibility   Staphylococcus aureus - MIC*    CIPROFLOXACIN <=0.5 SENSITIVE Sensitive     ERYTHROMYCIN >=8 RESISTANT Resistant     GENTAMICIN <=0.5 SENSITIVE Sensitive     OXACILLIN 0.5 SENSITIVE Sensitive     TETRACYCLINE <=1 SENSITIVE Sensitive     VANCOMYCIN <=0.5 SENSITIVE Sensitive     TRIMETH/SULFA <=10 SENSITIVE Sensitive     CLINDAMYCIN <=0.25 SENSITIVE Sensitive     RIFAMPIN <=0.5 SENSITIVE Sensitive     Inducible Clindamycin NEGATIVE Sensitive     * MODERATE STAPHYLOCOCCUS AUREUS  Culture, respiratory (non-expectorated)     Status: None   Collection Time:  02/22/19  5:30 PM   Specimen: Bronchoalveolar Lavage; Respiratory  Result Value Ref Range Status   Specimen Description   Final    BRONCHIAL ALVEOLAR LAVAGE Performed at [place] [address] [place], [place] [address]    Special Requests   Final    NONE Performed at [facility], [place] [address] [place], [place] [address]    Gram Stain   Final    ABUNDANT WBC PRESENT, PREDOMINANTLY PMN MODERATE GRAM POSITIVE COCCI Performed at [facility], [place] [address] [place], [place] [address]    Culture MODERATE STAPHYLOCOCCUS AUREUS  Final   Report Status 02/25/2019 FINAL  Final   Organism ID, Bacteria  STAPHYLOCOCCUS AUREUS  Final      Susceptibility   Staphylococcus aureus - MIC*    CIPROFLOXACIN <=0.5 SENSITIVE Sensitive     ERYTHROMYCIN >=8 RESISTANT Resistant     GENTAMICIN <=0.5 SENSITIVE Sensitive     OXACILLIN 0.5 SENSITIVE Sensitive     TETRACYCLINE <=1 SENSITIVE Sensitive     VANCOMYCIN 1 SENSITIVE Sensitive     TRIMETH/SULFA <=10 SENSITIVE Sensitive     CLINDAMYCIN <=0.25 SENSITIVE Sensitive     RIFAMPIN <=0.5 SENSITIVE Sensitive     Inducible Clindamycin NEGATIVE Sensitive     * MODERATE STAPHYLOCOCCUS AUREUS  Culture, blood (Routine X 2) w Reflex to ID Panel     Status: None (Preliminary result)   Collection Time: 02/24/19 11:47 AM   Specimen: BLOOD LEFT HAND  Result Value Ref Range Status   Specimen Description   Final    BLOOD LEFT HAND Performed at [facility], [place] [address], [place], [place] [address]    Special Requests   Final    BOTTLES DRAWN AEROBIC AND ANAEROBIC Blood Culture adequate volume Performed at [place] [address], [place], [place] [address]    Culture   Final    NO GROWTH 3 DAYS Performed at [facility], [place] [address] [place], [place] [address]    Report Status PENDING  Incomplete      Radiology Studies: No results found.      Scheduled Meds: . amLODipine  5 mg Oral Daily  . Chlorhexidine Gluconate Cloth  6 each Topical Daily  . feeding supplement (ENSURE ENLIVE)  237 mL Oral BID BM  . heparin injection (subcutaneous)  5,000 Units Subcutaneous Q8H  . insulin aspart  0-24 Units Subcutaneous Q4H  . ipratropium  0.5 mg Nebulization Q6H  . levalbuterol  1.25 mg Nebulization Q6H  . mouth rinse  15 mL Mouth Rinse BID  . multivitamin with minerals  1 tablet Oral Daily  . pantoprazole sodium  40 mg Per Tube Daily  . sodium chloride flush  10-40 mL Intracatheter Q12H   Continuous Infusions: . sodium chloride 30 mL/hr at 02/27/19 0800  .  ceFAZolin (ANCEF) IV Stopped (02/27/19  0558)  . sodium chloride      Assessment & Plan:    1.  MSSA bacteremia, sepsis with septic pulmonary emboli: Due to IV drug abuse/discitis/vertebral osteomyelitis/psoas abscesses. Blood cultures/psoas cultures and respiratory cultures growing MSSA.  Repeat blood cultures from 10/30 x 1 negative so far.Continue IV cefazolin.  Leukocytosis 12-13 K.  Afebrile.  TEE to rule out endocarditis today.  2.  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  Cultures growing MSSA.  Continue antibiotics as above.  Patient denies any back pain  at rest or on moving lower extremities.  3.  Metabolic encephalopathy: Present on admission in the setting of problem #1, dehydration/AKI/uremia and IV drug use. Improved.  Awake alert oriented x3 now.  MRI head showed abnormal dural enhancement.  Patient could have had meningoencephalitis on presentation.  4.  Hypernatremia: Now improved sodium levels to 141 with IV hydration.  Eating and drinking well.  Encourage free water.  Currently on low rate sodium chloride infusion as n.p.o. and plan for TEE later today.  Would avoid isotonic saline  5.  AKI, uremia: Much improved with IV hydration.  BUN peaked to 118 during this hospital course and now down to 40s.  Mental status improved.    6.  Pericardial effusion: Not very concerning on bedside ultrasonogram per PCCM.  Not sure if related to uremic pericarditis versus reactive to infection.  DC IV fluids post procedure  7.  Acute hypoxic respiratory failure: Patient had AMS, dyspnea, increased secretions on presentation and had to be intubated.  Now extubated and saturating well on 3 L nasal cannula.  Chest x-ray suggestive of multiple pulmonary cavities with left lower lobe pneumonia and small pleural effusion, findings suggestive of septic emboli.  Respiratory cultures growing MSSA.  Continue antibiotics, taper O2 as tolerated.  8.Generalized muscle  weakness/deconditioning: PT evaluation  DVT prophylaxis: Heparin subcu Code Status: Partial (okay to intubate, no CPR) Family / Patient Communication: Discussed with patient Disposition Plan: TBD     LOS: 5 days    Time spent: 35 minutes    Guilford Shi, MD [facility] Pager [phone]  If 7PM-7AM, please contact night-coverage [email or web address] Password [email or web address] 02/27/2019, 11:22 AM

## 2019-02-27 NOTE — Progress Notes (Addendum)
Nutrition Follow-up   RD working remotely.  DOCUMENTATION CODES:   Severe malnutrition in context of social or environmental circumstances  INTERVENTION:   - Once diet advanced, Ensure Enlive po BID, each supplement provides 350 kcal and 20 grams of protein  - MVI with minerals daily  NUTRITION DIAGNOSIS:   Severe Malnutrition related to social / environmental circumstances (IVDU) as evidenced by severe fat depletion, severe muscle depletion.  Ongoing  GOAL:   Patient will meet greater than or equal to 90% of their needs  Unmet at this time as pt is NPO  MONITOR:   Diet advancement, Supplement acceptance, Weight trends, Skin, I & O's, Labs  REASON FOR ASSESSMENT:   Consult Assessment of nutrition requirement/status  ASSESSMENT:   48 y.o. male with medical history of IVDU, ongoing heroin use. Patient presented to the ED on 10/27 with 3 week history of progressively worsening back pain and BLE weakness.  Denies any urinary retention but has noticed some bowel incontinence. Stated that he has had increasing trouble walking and that he has lost weight. Last use of heroin was today (10/27).  10/28 - intubated 10/31 - extubated  Noted plan for TEE today for bacteremia in setting of IVDA. Pt is NPO at this time.  Per RN edema assessment, pt with moderate pitting edema to BLE.  Medications reviewed and include: Ensure Enlive BID, SSI q 4 hours, Protonix, IV abx  Labs reviewed: BUN 48, creatinine 0.53, hemoglobin 7.8 CBG's: 88-333 x 24 hours  UOP: 1150 ml x 24 hours I/O's: +2.0 L since admit  Diet Order:   Diet Order            Diet NPO time specified  Diet effective midnight              EDUCATION NEEDS:   No education needs have been identified at this time  Skin:  Skin Assessment: Skin Integrity Issues: Skin Integrity Issues: Stage I: sacrum  Last BM:  02/27/19 type 7 via rectal tube  Height:   Ht Readings from Last 1 Encounters:  02/22/19 6'  (1.829 m)    Weight:   Wt Readings from Last 1 Encounters:  02/26/19 63.3 kg    Ideal Body Weight:  80.9 kg  BMI:  Body mass index is 18.93 kg/m.  Estimated Nutritional Needs:   Kcal:  2100-2300  Protein:  100-120 grams  Fluid:  >/= 2.0 L    Gaynell Face, MS, RD, LDN Inpatient Clinical Dietitian Pager: [phone] Weekend/After Hours: [phone]

## 2019-02-27 NOTE — Evaluation (Signed)
Physical Therapy Evaluation Patient Details Name: Franklin Anderson MRN: [id number] DOB: Sep 26, 1970 Today's Date: 02/27/2019   History of Present Illness  Pt is 48 y/o M with PMH: IVDA (heroine), who presented with worsening back pain and B LE weakness. 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 Pt was intubated 10/28, extubated 10/31.  Clinical Impression  On eval, pt required Min assist +2 for safety/lines for mobility. He was able to stand and pivot from bed to recliner using a RW. Pt c/o mod-severe back pain with activity. Remained on [place] O2 during session. BP slightly elevated. Discussed d/c plan-pt is concerned about drug rehabilitation. Explained that we are working towards getting him medically and functionally stable currently. Will continue to follow and progress activity as tolerated. Pt will benefit from CSW consult to discuss drug rehab options.     Follow Up Recommendations Home health PT;SNF(depending on progress)  Pt more concerned about getting to drug rehab    Equipment Recommendations  (continuing to assess)    Recommendations for Other Services       Precautions / Restrictions Precautions Precautions: Fall Precaution Comments: multiple lines, flexi-seal, monitor vitals (BP, O2) Restrictions Weight Bearing Restrictions: No      Mobility  Bed Mobility Overal bed mobility: Modified Independent;Needs Assistance Bed Mobility: Supine to Sit     Supine to sit: [facility] elevated;Min assist     General bed mobility comments: close guard for safety, monitoring lines. increased time. assist to scoot to EOB but only to avoid disruption of flexiseal-pt is capable of completing task unassisted.  Transfers Overall transfer level: Needs assistance Equipment used: Rolling walker (2 wheeled) Transfers: Sit to/from Stand Sit to Stand: Min assist;+2 safety/equipment;+2 physical assistance         General transfer  comment: Assist to rise, stabilize, control descent. 2 attempts to get to standing. Stand pivot, bed to recliner, using RW. Increased time. Remained on [place] O2.  Ambulation/Gait             General Gait Details: NT today.  Stairs            Wheelchair Mobility    Modified Rankin (Stroke Patients Only)       Balance Overall balance assessment: Needs assistance Sitting-balance support: Bilateral upper extremity supported Sitting balance-Leahy Scale: Fair Sitting balance - Comments: use of UEs to maintain seated balance   Standing balance support: Bilateral upper extremity supported Standing balance-Leahy Scale: Poor Standing balance comment: CGA +2 for line mgt and safety in static standing. No overy LOB, but notable shaking/weakness and heavy use of UEs through RW.                             Pertinent Vitals/Pain Pain Assessment: 0-10 Pain Score: 8  Pain Location: middle and lower back Pain Descriptors / Indicators: Discomfort;Guarding Pain Intervention(s): Limited activity within patient's tolerance;Monitored during session;Repositioned    Home Living Family/patient expects to be discharged to:: Unsure Living Arrangements: Alone   Type of Home: House Home Access: Stairs to enter       Home Equipment: Environmental consultant - 2 wheels;Cane - single point      Prior Function Level of Independence: Independent         Comments: has RW and SPC, but was not using either AD.     Hand Dominance   Dominant Hand: Right    Extremity/Trunk Assessment   Upper Extremity Assessment  Upper Extremity Assessment: Defer to OT evaluation RUE Deficits / Details: shoulder/elbow/wrist grossly 3/5, but noted shaking with fxl activities and pt report of fatigue. Grips 3+/5 LUE Deficits / Details: shoulder/elbow/wrist grossly 3/5, but noted shaking with fxl activities and pt report of fatigue. Grips 3+/5    Lower Extremity Assessment Lower Extremity Assessment:  Generalized weakness    Cervical / Trunk Assessment Cervical / Trunk Assessment: Normal  Communication   Communication: No difficulties  Cognition Arousal/Alertness: Awake/alert Behavior During Therapy: WFL for tasks assessed/performed Overall Cognitive Status: Within Functional Limits for tasks assessed                                        General Comments      Exercises Other Exercises Other Exercises: OT facilitates education re: role of OT in this setting, d/c recommendations, potential need for therapy f/u after d/c from acute setting. Pt is agreeable, but also expresses concerns about recieving adequate "drug rehab" Other Exercises: OT facilitates education re: safe hand placement relative to RW for ADL transfers and mobility. Pt demos understnading. Requires f/u.   Assessment/Plan    PT Assessment Patient needs continued PT services  PT Problem List Decreased strength;Decreased mobility;Decreased activity tolerance;Decreased balance;Pain;Decreased knowledge of use of DME       PT Treatment Interventions DME instruction;Gait training;Therapeutic exercise;Therapeutic activities;Patient/family education;Functional mobility training;Balance training    PT Goals (Current goals can be found in the Care Plan section)  Acute Rehab PT Goals Patient Stated Goal: To get stronger and make better decisions PT Goal Formulation: With patient Time For Goal Achievement: 03/13/19 Potential to Achieve Goals: Good    Frequency Min 3X/week   Barriers to discharge        Co-evaluation PT/OT/SLP Co-Evaluation/Treatment: Yes Reason for Co-Treatment: For patient/therapist safety;Complexity of the patient's impairments (multi-system involvement) PT goals addressed during session: Mobility/safety with mobility;Balance;Proper use of DME OT goals addressed during session: ADL's and self-care       AM-PAC PT "6 Clicks" Mobility  Outcome Measure Help needed turning from  your back to your side while in a flat bed without using bedrails?: A Little Help needed moving from lying on your back to sitting on the side of a flat bed without using bedrails?: A Little Help needed moving to and from a bed to a chair (including a wheelchair)?: A Little Help needed standing up from a chair using your arms (e.g., wheelchair or bedside chair)?: A Little Help needed to walk in hospital room?: A Lot Help needed climbing 3-5 steps with a railing? : A Lot 6 Click Score: 16    End of Session Equipment Utilized During Treatment: Oxygen Activity Tolerance: Patient tolerated treatment well Patient left: in chair;with call bell/phone within reach;with chair alarm set   PT Visit Diagnosis: Muscle weakness (generalized) (M62.81);Difficulty in walking, not elsewhere classified (R26.2)    Time: 6314-9702 PT Time Calculation (min) (ACUTE ONLY): 27 min   Charges:   PT Evaluation $PT Eval Moderate Complexity: 1 Mod           Rebeca Alert, PT Acute Rehabilitation Services Pager: [phone] Office: [phone]

## 2019-02-28 ENCOUNTER — Inpatient Hospital Stay (HOSPITAL_COMMUNITY): Payer: Self-pay | Admitting: Registered Nurse

## 2019-02-28 ENCOUNTER — Inpatient Hospital Stay (HOSPITAL_COMMUNITY): Payer: Self-pay

## 2019-02-28 ENCOUNTER — Encounter (HOSPITAL_COMMUNITY)
Admission: EM | Disposition: A | Discharge status: Home Health | Payer: Self-pay | Source: Home / Self Care | Attending: Internal Medicine

## 2019-02-28 DIAGNOSIS — R0602 Shortness of breath: Secondary | ICD-10-CM

## 2019-02-28 DIAGNOSIS — R0600 Dyspnea, unspecified: Secondary | ICD-10-CM

## 2019-02-28 DIAGNOSIS — I2699 Other pulmonary embolism without acute cor pulmonale: Secondary | ICD-10-CM

## 2019-02-28 DIAGNOSIS — R7401 Elevation of levels of liver transaminase levels: Secondary | ICD-10-CM

## 2019-02-28 DIAGNOSIS — I340 Nonrheumatic mitral (valve) insufficiency: Secondary | ICD-10-CM

## 2019-02-28 DIAGNOSIS — R7881 Bacteremia: Secondary | ICD-10-CM

## 2019-02-28 HISTORY — PX: TEE WITHOUT CARDIOVERSION: SHX5443

## 2019-02-28 LAB — GLUCOSE, CAPILLARY
Glucose-Capillary: 103 mg/dL — ABNORMAL HIGH (ref 70–99)
Glucose-Capillary: 105 mg/dL — ABNORMAL HIGH (ref 70–99)
Glucose-Capillary: 108 mg/dL — ABNORMAL HIGH (ref 70–99)
Glucose-Capillary: 110 mg/dL — ABNORMAL HIGH (ref 70–99)
Glucose-Capillary: 172 mg/dL — ABNORMAL HIGH (ref 70–99)
Glucose-Capillary: 67 mg/dL — ABNORMAL LOW (ref 70–99)
Glucose-Capillary: 80 mg/dL (ref 70–99)
Glucose-Capillary: 94 mg/dL (ref 70–99)

## 2019-02-28 LAB — HEMOGLOBIN A1C
Hgb A1c MFr Bld: 6.4 % — ABNORMAL HIGH (ref 4.8–5.6)
Mean Plasma Glucose: 136.98 mg/dL

## 2019-02-28 LAB — FERRITIN: Ferritin: 685 ng/mL — ABNORMAL HIGH (ref 24–336)

## 2019-02-28 LAB — IRON AND TIBC
Iron: 50 ug/dL (ref 45–182)
Saturation Ratios: 38 % (ref 17.9–39.5)
TIBC: 133 ug/dL — ABNORMAL LOW (ref 250–450)
UIBC: 83 ug/dL

## 2019-02-28 LAB — BASIC METABOLIC PANEL
Anion gap: 5 (ref 5–15)
BUN: 34 mg/dL — ABNORMAL HIGH (ref 6–20)
CO2: 26 mmol/L (ref 22–32)
Calcium: 7.9 mg/dL — ABNORMAL LOW (ref 8.9–10.3)
Chloride: 107 mmol/L (ref 98–111)
Creatinine, Ser: 0.43 mg/dL — ABNORMAL LOW (ref 0.61–1.24)
GFR calc Af Amer: >60 mL/min (ref >60)
GFR calc non Af Amer: >60 mL/min (ref >60)
Glucose, Bld: 104 mg/dL — ABNORMAL HIGH (ref 70–99)
Potassium: 4.3 mmol/L (ref 3.5–5.1)
Sodium: 138 mmol/L (ref 135–145)

## 2019-02-28 LAB — HEMOGLOBIN AND HEMATOCRIT, BLOOD
HCT: 25.6 % — ABNORMAL LOW (ref 39.0–52.0)
HCT: 29.6 % — ABNORMAL LOW (ref 39.0–52.0)
Hemoglobin: 7.9 g/dL — ABNORMAL LOW (ref 13.0–17.0)
Hemoglobin: 9.4 g/dL — ABNORMAL LOW (ref 13.0–17.0)

## 2019-02-28 LAB — CBC
HCT: 23.4 % — ABNORMAL LOW (ref 39.0–52.0)
Hemoglobin: 7.1 g/dL — ABNORMAL LOW (ref 13.0–17.0)
MCH: 28.1 pg (ref 26.0–34.0)
MCHC: 30.3 g/dL (ref 30.0–36.0)
MCV: 92.5 fL (ref 80.0–100.0)
Platelets: 182 10*3/uL (ref 150–400)
RBC: 2.53 MIL/uL — ABNORMAL LOW (ref 4.22–5.81)
RDW: 15.9 % — ABNORMAL HIGH (ref 11.5–15.5)
WBC: 9.4 10*3/uL (ref 4.0–10.5)
nRBC: 0 % (ref 0.0–0.2)

## 2019-02-28 LAB — PREPARE RBC (CROSSMATCH)

## 2019-02-28 IMAGING — CT CT ANGIO CHEST
2 of 6 series · 14 of 46 positions shown · IV contrast (OMNIPAQUE)
Comparison: Same-day radiograph
COMPARISON: Same-day radiograph

Addendum:
CLINICAL DATA: Increasing shortness of breath post transesophageal
ECHO

EXAM:
CT ANGIOGRAPHY CHEST WITH CONTRAST
TECHNIQUE: Multidetector CT imaging of the chest was performed using the
standard protocol during bolus administration of intravenous
contrast. Multiplanar CT image reconstructions and MIPs were
obtained to evaluate the vascular anatomy.
CONTRAST:  100mL OMNIPAQUE IOHEXOL 350 MG/ML SOLN

[Series 7: thins · axial · 0.68mm/px · z∈[-108,+214]mm · 12 of 354 slices shown]
[im 16/354  lung]
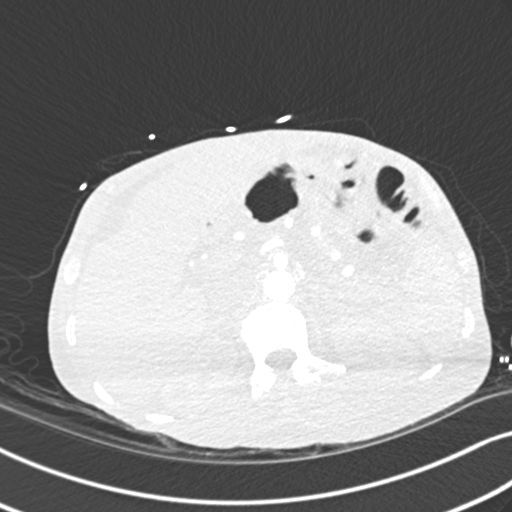
[im 47/354  soft-tissue]
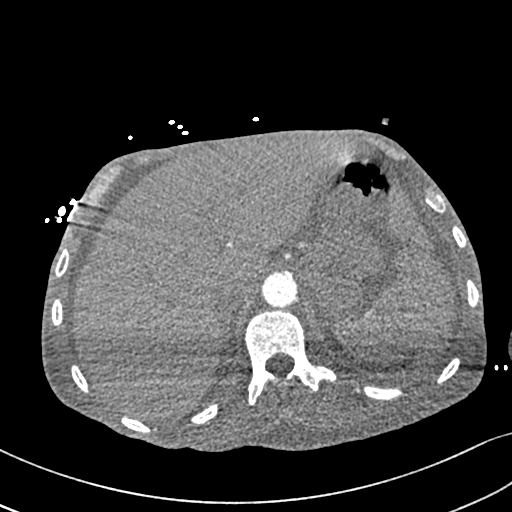
[im 77/354  lung]
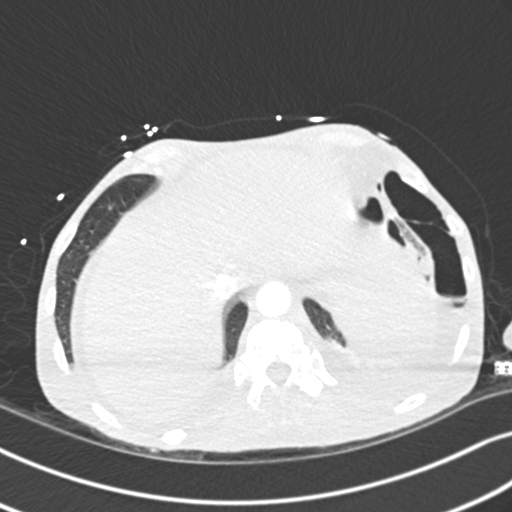
[im 108/354  soft-tissue]
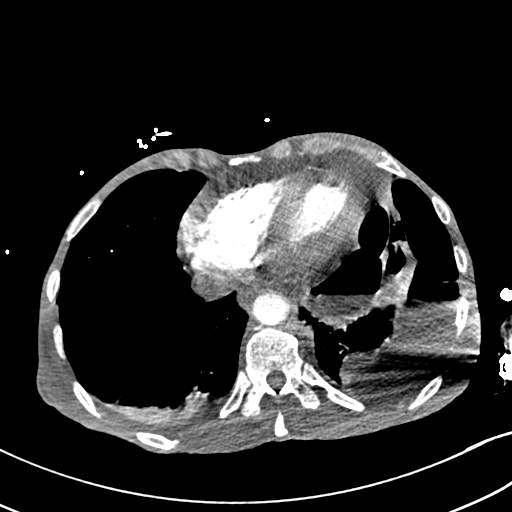
[im 139/354  lung]
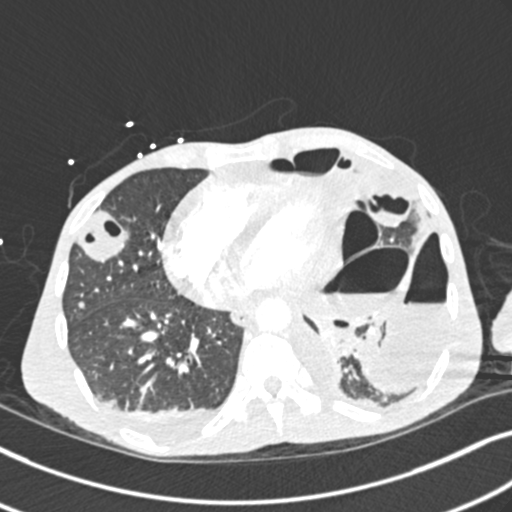
[im 169/354  soft-tissue]
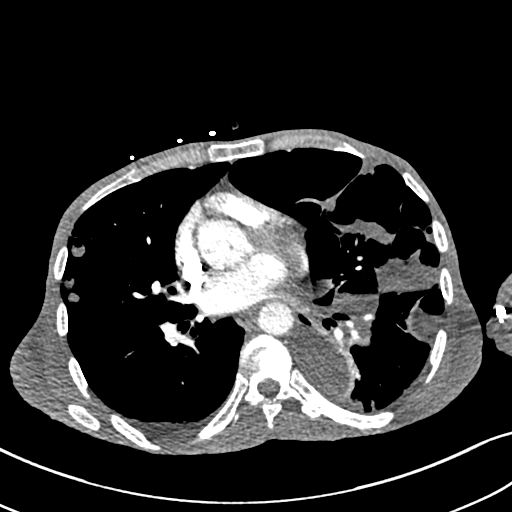
[im 185/354  lung]
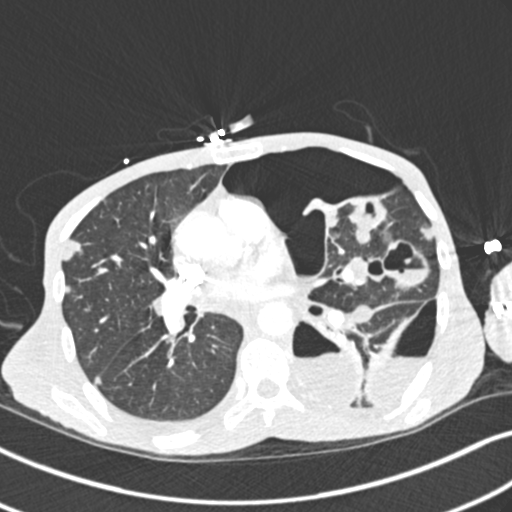
[im 215/354  soft-tissue]
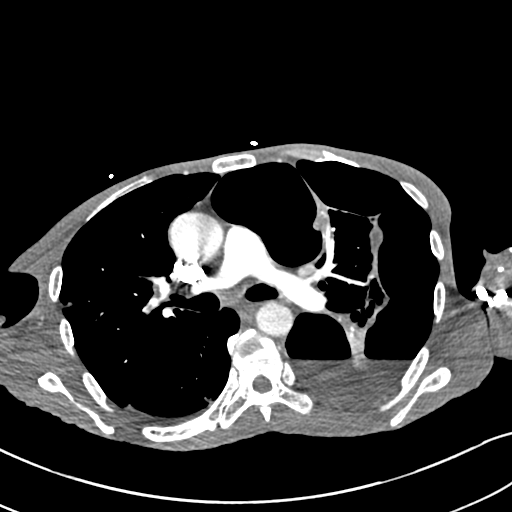
[im 246/354  lung]
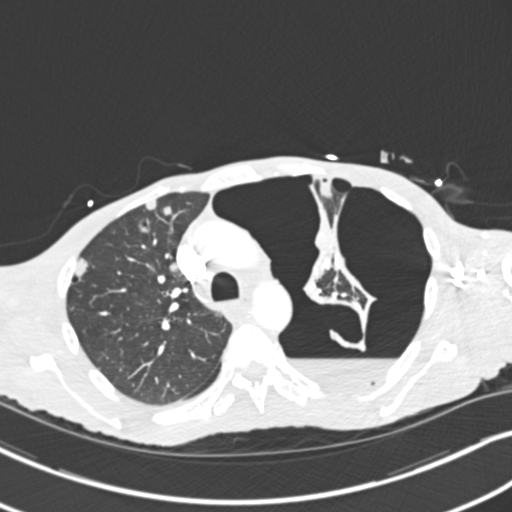
[im 277/354  soft-tissue]
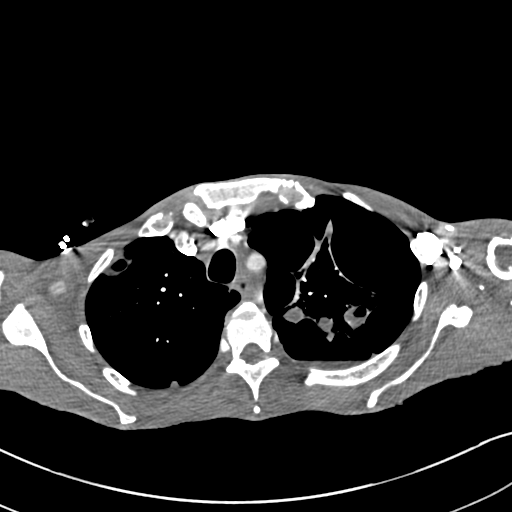
[im 307/354  lung]
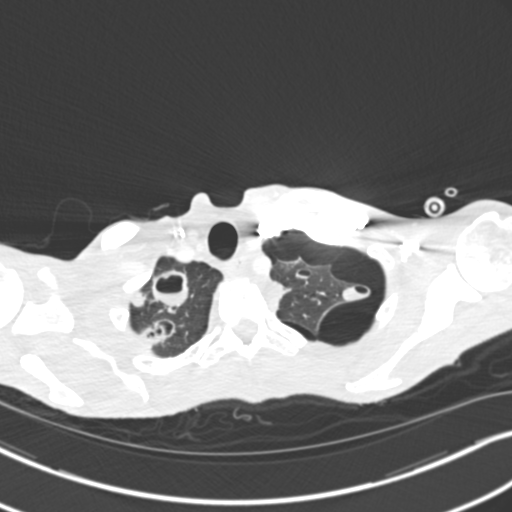
[im 338/354  soft-tissue]
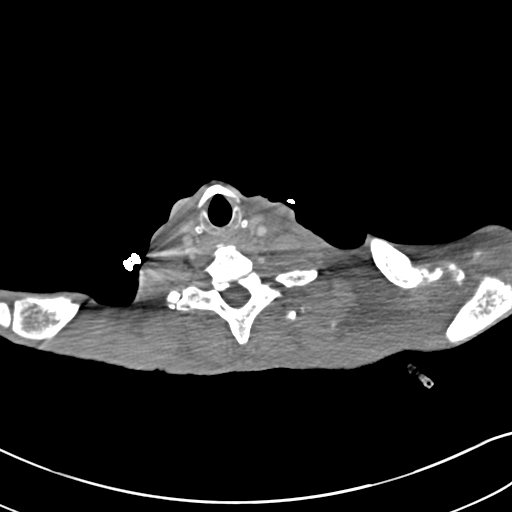

[Series 9: coronal mpr · coronal · 0.61mm/px · 2 of 74 slices shown]
[im 25/74  soft-tissue]
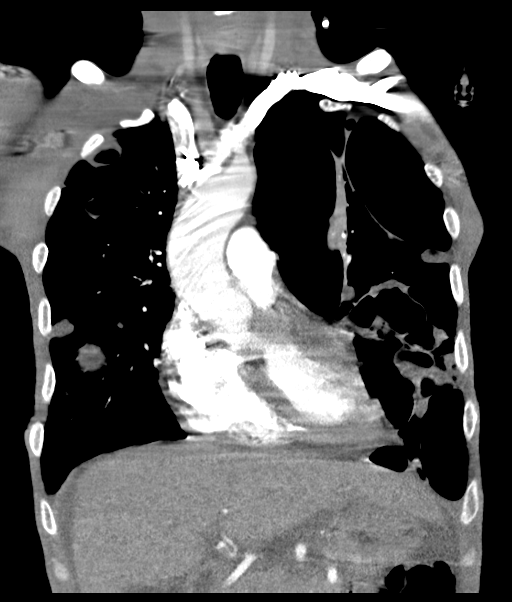
[im 49/74  soft-tissue]
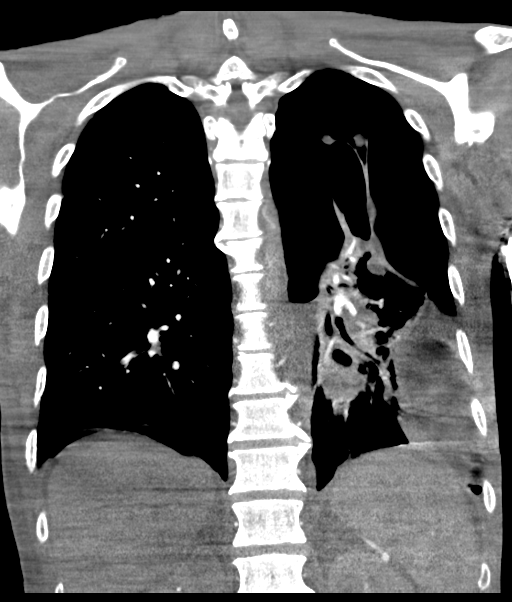

[14 of 46 positions shown; findings below may reference images not displayed]

FINDINGS: Cardiovascular: Satisfactory opacification the pulmonary arteries to
the segmental level. Segmental and subsegmental pulmonary artery
emboli are noted throughout the left lower lobe, lingula and with
the lobar, segmental and subsegmental arteries of the right upper
lobe. Suspect more extensive embolic burden however given the
numerous cavitary lesions throughout the lungs compatible with
septic emboli. Flattening of the intraventricular septum and mild
elevation of the RV LV ratio (0.95) concerning for right heart
strain. Cardiac size remains within normal limits. Few coronary
artery calcifications are present. No sizable pericardial effusion
though difficult to discern given increased attenuation of the
mediastinal fat.

Mediastinum/Nodes: Diffusely increased attenuation of the
mediastinal fat suggesting soft tissue edema. Prominent mediastinal
and hilar adenopathy, likely reactive. No acute abnormality of the
trachea or esophagus. Thyroid gland and thoracic inlet are
unremarkable.

Lungs/Pleura: There are numerous rounded and cavitary fluid-filled
lesions throughout the lungs suspicious for septic pulmonary emboli.
Some interspersed regions of ground-glass attenuation and
tree-in-bud nodularity noted in the left lung base. There is
irregular visceral pleural thickening involving the left lung with a
large left hydropneumothorax with dependently layering simple
attenuation fluid. Small right pleural effusion present as well with
adjacent passive atelectasis.

Upper Abdomen: Nodular hepatic surface contour. Moderate volume
upper abdominal ascites.

Musculoskeletal: Nonunited subacute to remote lateral left tenth rib
fracture.No acute osseous abnormality or suspicious osseous lesion.
Paucity of subcutaneous fat. Body wall edema.

Review of the MIP images confirms the above findings.
IMPRESSION: 1. Features of widespread septic emboli with more central pulmonary
arterial filling defects seen within the left lower lobe, positive
for acute PE with CT evidence of right heart strain (RV/LV Ratio =
0.95) consistent with at least submassive (intermediate risk) PE.
The presence of right heart strain has been associated with an
increased risk of morbidity and mortality. Please activate Code PE
by paging [[email or web address]].
2. No visible pneumomediastinum.
3. Irregular visceral pleural thickening involving the left lung
with a large left hydropneumothorax, concerning for empyema.
4. Small right pleural effusion and adjacent passive atelectasis.
5. Nodular hepatic surface contour, consistent with cirrhosis.
Moderate volume upper abdominal ascites.
6. Additional features of anasarca diffusely increased attenuation
of the mediastinal fat and circumferential body wall edema.
7. Aortic Atherosclerosis ([YU]-[YU]).

Currently awaiting provider callback. Addendum will be submitted
following contact with the ordering provider or care team.

ADDENDUM:
Minimal mediastinal shift.

Results and addendum were called by telephone at the time of
interpretation on [DATE] at [DATE] to provider NP CAYO , who
verbally acknowledged these results.

*** End of Addendum ***
FINDINGS: Cardiovascular: Satisfactory opacification the pulmonary arteries to
the segmental level. Segmental and subsegmental pulmonary artery
emboli are noted throughout the left lower lobe, lingula and with
the lobar, segmental and subsegmental arteries of the right upper
lobe. Suspect more extensive embolic burden however given the
numerous cavitary lesions throughout the lungs compatible with
septic emboli. Flattening of the intraventricular septum and mild
elevation of the RV LV ratio (0.95) concerning for right heart
strain. Cardiac size remains within normal limits. Few coronary
artery calcifications are present. No sizable pericardial effusion
though difficult to discern given increased attenuation of the
mediastinal fat.

Mediastinum/Nodes: Diffusely increased attenuation of the
mediastinal fat suggesting soft tissue edema. Prominent mediastinal
and hilar adenopathy, likely reactive. No acute abnormality of the
trachea or esophagus. Thyroid gland and thoracic inlet are
unremarkable.

Lungs/Pleura: There are numerous rounded and cavitary fluid-filled
lesions throughout the lungs suspicious for septic pulmonary emboli.
Some interspersed regions of ground-glass attenuation and
tree-in-bud nodularity noted in the left lung base. There is
irregular visceral pleural thickening involving the left lung with a
large left hydropneumothorax with dependently layering simple
attenuation fluid. Small right pleural effusion present as well with
adjacent passive atelectasis.

Upper Abdomen: Nodular hepatic surface contour. Moderate volume
upper abdominal ascites.

Musculoskeletal: Nonunited subacute to remote lateral left tenth rib
fracture.No acute osseous abnormality or suspicious osseous lesion.
Paucity of subcutaneous fat. Body wall edema.

Review of the MIP images confirms the above findings.
IMPRESSION: 1. Features of widespread septic emboli with more central pulmonary
arterial filling defects seen within the left lower lobe, positive
for acute PE with CT evidence of right heart strain (RV/LV Ratio =
0.95) consistent with at least submassive (intermediate risk) PE.
The presence of right heart strain has been associated with an
increased risk of morbidity and mortality. Please activate Code PE
by paging [[email or web address]].
2. No visible pneumomediastinum.
3. Irregular visceral pleural thickening involving the left lung
with a large left hydropneumothorax, concerning for empyema.
4. Small right pleural effusion and adjacent passive atelectasis.
5. Nodular hepatic surface contour, consistent with cirrhosis.
Moderate volume upper abdominal ascites.
6. Additional features of anasarca diffusely increased attenuation
of the mediastinal fat and circumferential body wall edema.
7. Aortic Atherosclerosis ([YU]-[YU]).

Currently awaiting provider callback. Addendum will be submitted
following contact with the ordering provider or care team.

## 2019-02-28 IMAGING — DX DG CHEST 1V PORT
1 series · 1 of 1 positions shown · non-contrast
Comparison: [DATE]
COMPARISON: [DATE]

Addendum:
CLINICAL DATA: Elevated heart rate and dyspnea.

EXAM:
PORTABLE CHEST 1 VIEW

[chest ap]
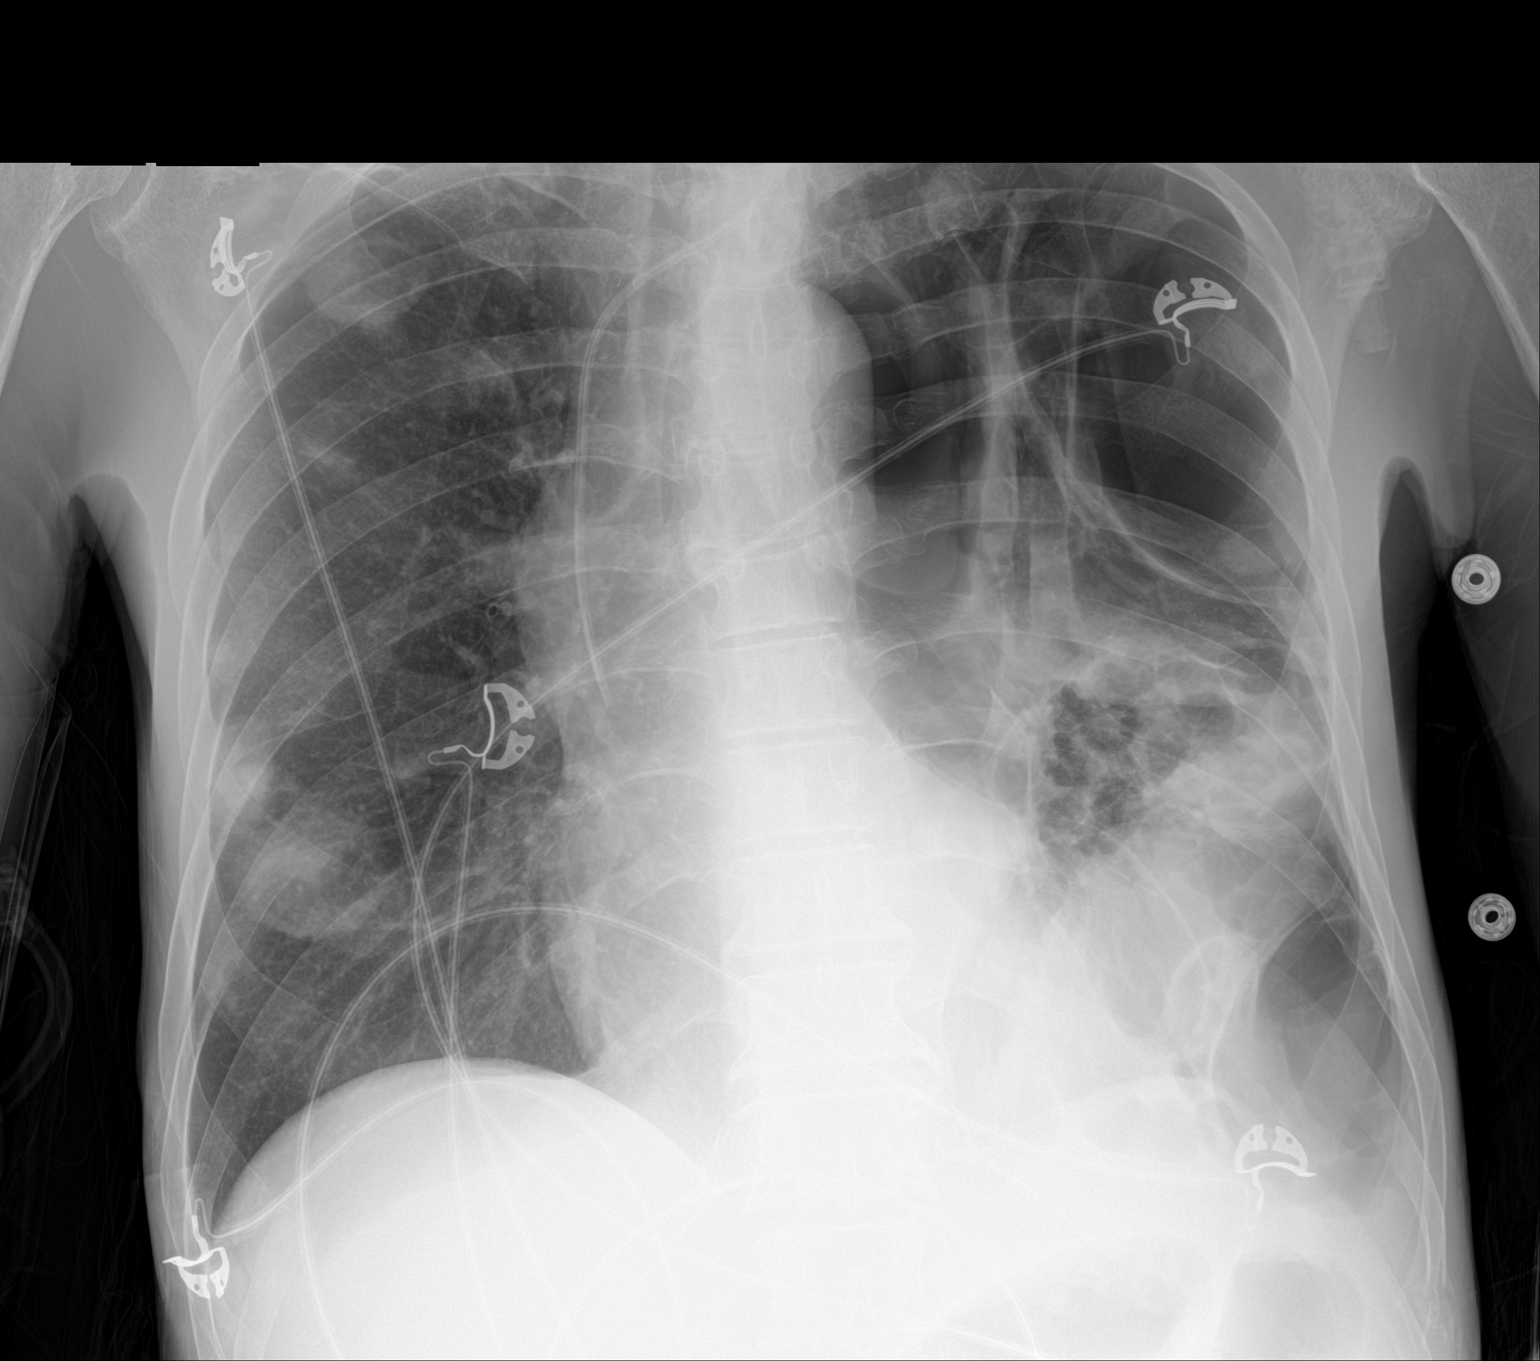

[1 of 1 positions shown; findings below may reference images not displayed]

FINDINGS: New loculated left pneumothorax in the setting of cavitary lung
lesions. Moderately large pneumothorax and areas of
collapse/consolidation in the left lung base also with sub pulmonic
lucency and deep sulcus sign.

Concomitant left pleural effusion is suspected.

Cavitary lesions in the right chest are unchanged.

Mild rotation with probable slight mediastinal shift.

Post extubation with left IJ central venous catheter terminating at
the caval to atrial junction as before.

No acute bone process.
IMPRESSION: 1. New loculated left pneumothorax in the setting of cavitary lung
lesions. Mediastinal shift while slight is present on the current
study.
2. Stable cavitary lesions in the right chest.
3. Post extubation.
4. A call is out to the referring provider to further discuss above
findings.

ADDENDUM:
Critical Value/emergent results were called by telephone at the time
of interpretation on [DATE] at [DATE] to TONY,
who verbally acknowledged these results.

*** End of Addendum ***
FINDINGS: New loculated left pneumothorax in the setting of cavitary lung
lesions. Moderately large pneumothorax and areas of
collapse/consolidation in the left lung base also with sub pulmonic
lucency and deep sulcus sign.

Concomitant left pleural effusion is suspected.

Cavitary lesions in the right chest are unchanged.

Mild rotation with probable slight mediastinal shift.

Post extubation with left IJ central venous catheter terminating at
the caval to atrial junction as before.

No acute bone process.
IMPRESSION: 1. New loculated left pneumothorax in the setting of cavitary lung
lesions. Mediastinal shift while slight is present on the current
study.
2. Stable cavitary lesions in the right chest.
3. Post extubation.
4. A call is out to the referring provider to further discuss above
findings.

## 2019-02-28 SURGERY — ECHOCARDIOGRAM, TRANSESOPHAGEAL
Anesthesia: Monitor Anesthesia Care

## 2019-02-28 MED ORDER — AMLODIPINE BESYLATE 10 MG PO TABS
10.0000 mg | ORAL_TABLET | Freq: Every day | ORAL | Status: DC
  Administered 2019-02-28 – 2019-03-15 (×16): 10 mg via ORAL
  Filled 2019-02-28 (×16): qty 1

## 2019-02-28 MED ORDER — CLONAZEPAM 0.5 MG PO TABS
0.5000 mg | ORAL_TABLET | Freq: Three times a day (TID) | ORAL | Status: DC | PRN
  Administered 2019-03-01 – 2019-04-14 (×96): 0.5 mg via ORAL
  Filled 2019-02-28 (×98): qty 1

## 2019-02-28 MED ORDER — IOHEXOL 350 MG/ML SOLN
100.0000 mL | Freq: Once | INTRAVENOUS | Status: AC | PRN
  Administered 2019-02-28: 100 mL via INTRAVENOUS

## 2019-02-28 MED ORDER — HYDRALAZINE HCL 20 MG/ML IJ SOLN
10.0000 mg | Freq: Four times a day (QID) | INTRAMUSCULAR | Status: DC | PRN
  Administered 2019-03-09 – 2019-04-09 (×7): 10 mg via INTRAVENOUS
  Filled 2019-02-28 (×9): qty 1

## 2019-02-28 MED ORDER — LACTATED RINGERS IV SOLN
INTRAVENOUS | Status: AC | PRN
  Administered 2019-02-28: 1000 mL via INTRAVENOUS

## 2019-02-28 MED ORDER — PANTOPRAZOLE SODIUM 40 MG PO TBEC
40.0000 mg | DELAYED_RELEASE_TABLET | Freq: Every day | ORAL | Status: DC
  Administered 2019-02-28 – 2019-04-14 (×46): 40 mg via ORAL
  Filled 2019-02-28 (×46): qty 1

## 2019-02-28 MED ORDER — PROPOFOL 500 MG/50ML IV EMUL
INTRAVENOUS | Status: DC | PRN
  Administered 2019-02-28: 100 ug/kg/min via INTRAVENOUS

## 2019-02-28 MED ORDER — LEVALBUTEROL HCL 1.25 MG/0.5ML IN NEBU
1.2500 mg | INHALATION_SOLUTION | Freq: Two times a day (BID) | RESPIRATORY_TRACT | Status: DC
  Administered 2019-02-28 – 2019-03-02 (×4): 1.25 mg via RESPIRATORY_TRACT
  Filled 2019-02-28 (×4): qty 0.5

## 2019-02-28 MED ORDER — METOPROLOL TARTRATE 25 MG PO TABS
25.0000 mg | ORAL_TABLET | Freq: Two times a day (BID) | ORAL | Status: DC
  Filled 2019-02-28: qty 1

## 2019-02-28 MED ORDER — LIDOCAINE 2% (20 MG/ML) 5 ML SYRINGE
INTRAMUSCULAR | Status: DC | PRN
  Administered 2019-02-28: 60 mg via INTRAVENOUS
  Administered 2019-02-28: 40 mg via INTRAVENOUS

## 2019-02-28 MED ORDER — SODIUM CHLORIDE 0.9 % IV SOLN: INTRAVENOUS | Status: DC

## 2019-02-28 MED ORDER — FUROSEMIDE 10 MG/ML IJ SOLN
20.0000 mg | Freq: Once | INTRAMUSCULAR | Status: AC
  Administered 2019-02-28: 20 mg via INTRAVENOUS
  Filled 2019-02-28: qty 2

## 2019-02-28 MED ORDER — LABETALOL HCL 5 MG/ML IV SOLN
20.0000 mg | INTRAVENOUS | Status: AC | PRN
  Administered 2019-02-28: 20 mg via INTRAVENOUS
  Filled 2019-02-28: qty 4

## 2019-02-28 MED ORDER — SODIUM CHLORIDE (PF) 0.9 % IJ SOLN
INTRAMUSCULAR | Status: AC
  Filled 2019-02-28: qty 50

## 2019-02-28 MED ORDER — HEPARIN (PORCINE) 25000 UT/250ML-% IV SOLN
1250.0000 [IU]/h | INTRAVENOUS | Status: DC
  Administered 2019-02-28: 1250 [IU]/h via INTRAVENOUS
  Filled 2019-02-28: qty 250

## 2019-02-28 MED ORDER — PROPOFOL 10 MG/ML IV BOLUS
INTRAVENOUS | Status: DC | PRN
  Administered 2019-02-28 (×2): 20 mg via INTRAVENOUS

## 2019-02-28 MED ORDER — HYDRALAZINE HCL 25 MG PO TABS
25.0000 mg | ORAL_TABLET | Freq: Three times a day (TID) | ORAL | Status: DC
  Administered 2019-02-28 (×2): 25 mg via ORAL
  Filled 2019-02-28 (×2): qty 1

## 2019-02-28 MED ORDER — AMLODIPINE BESYLATE 10 MG PO TABS: 10.0000 mg | ORAL_TABLET | Freq: Every day | ORAL | Status: DC

## 2019-02-28 NOTE — Progress Notes (Signed)
Subjective:  The patient wants his tongue piercing removed  Antibiotics:  Anti-infectives (From admission, onward)   Start     Dose/Rate Route Frequency Ordered Stop   02/24/19 2200  vancomycin (VANCOCIN) IVPB 1000 mg/200 mL premix  Status:  Discontinued     1,000 mg 200 mL/hr over 60 Minutes Intravenous Every 12 hours 02/24/19 1010 02/24/19 1253   02/24/19 1600  [MAR Hold]  ceFAZolin (ANCEF) IVPB 2g/100 mL premix     (MAR Hold since Tue 02/28/2019 at 1033.Hold Reason: Transfer to a Procedural area.)   2 g 200 mL/hr over 30 Minutes Intravenous Every 8 hours 02/24/19 1253     02/23/19 2200  vancomycin (VANCOCIN) IVPB 750 mg/150 ml premix  Status:  Discontinued     750 mg 150 mL/hr over 60 Minutes Intravenous Every 24 hours 02/23/19 0853 02/23/19 0909   02/23/19 2200  vancomycin (VANCOCIN) IVPB 750 mg/150 ml premix  Status:  Discontinued     750 mg 150 mL/hr over 60 Minutes Intravenous Every 12 hours 02/23/19 0909 02/24/19 1010   02/22/19 2200  vancomycin (VANCOCIN) IVPB 750 mg/150 ml premix  Status:  Discontinued     750 mg 150 mL/hr over 60 Minutes Intravenous Every 24 hours 02/22/19 0826 02/22/19 0827   02/22/19 2200  vancomycin (VANCOCIN) IVPB 1000 mg/200 mL premix  Status:  Discontinued     1,000 mg 200 mL/hr over 60 Minutes Intravenous Every 24 hours 02/22/19 0827 02/23/19 0853   02/22/19 0900  ceFEPIme (MAXIPIME) 2 g in sodium chloride 0.9 % 100 mL IVPB  Status:  Discontinued     2 g 200 mL/hr over 30 Minutes Intravenous Every 12 hours 02/22/19 0826 02/23/19 1135   02/21/19 2015  ceFEPIme (MAXIPIME) 2 g in sodium chloride 0.9 % 100 mL IVPB     2 g 200 mL/hr over 30 Minutes Intravenous  Once 02/21/19 2013 02/22/19 0031   02/21/19 2015  metroNIDAZOLE (FLAGYL) IVPB 500 mg     500 mg 100 mL/hr over 60 Minutes Intravenous  Once 02/21/19 2013 02/22/19 0031   02/21/19 2015  vancomycin (VANCOCIN) IVPB 1000 mg/200 mL premix     1,000 mg 200 mL/hr over 60 Minutes  Intravenous  Once 02/21/19 2013 02/22/19 0200      Medications: Scheduled Meds:  [MAR Hold] amLODipine  10 mg Oral Daily   [MAR Hold] Chlorhexidine Gluconate Cloth  6 each Topical Daily   [MAR Hold] feeding supplement (ENSURE ENLIVE)  237 mL Oral BID BM   [MAR Hold] heparin injection (subcutaneous)  5,000 Units Subcutaneous Q8H   [MAR Hold] hydrALAZINE  25 mg Oral Q8H   [MAR Hold] insulin aspart  0-24 Units Subcutaneous Q4H   [MAR Hold] ipratropium  0.5 mg Nebulization BID   [MAR Hold] levalbuterol  1.25 mg Nebulization BID   [MAR Hold] mouth rinse  15 mL Mouth Rinse BID   [MAR Hold] multivitamin with minerals  1 tablet Oral Daily   [MAR Hold] pantoprazole  40 mg Oral Daily   [MAR Hold] sodium chloride flush  10-40 mL Intracatheter Q12H   Continuous Infusions:  sodium chloride 20 mL/hr at 02/28/19 1008   [MAR Hold]  ceFAZolin (ANCEF) IV Stopped (02/28/19 0641)   lactated ringers 1,000 mL (02/28/19 1044)   [MAR Hold] sodium chloride     PRN Meds:.[MAR Hold] acetaminophen **OR** [MAR Hold] acetaminophen, [MAR Hold] acetaminophen, [MAR Hold]  HYDROmorphone (DILAUDID) injection, [MAR Hold] ipratropium-albuterol, [MAR Hold] labetalol, lactated ringers, [MAR  Hold] LORazepam, [MAR Hold] ondansetron **OR** [MAR Hold] ondansetron (ZOFRAN) IV, [MAR Hold] oxyCODONE-acetaminophen, [MAR Hold] senna-docusate, [MAR Hold] sodium chloride flush    Objective: Weight change:   Intake/Output Summary (Last 24 hours) at 02/28/2019 1124 Last data filed at 02/28/2019 0641 Gross per 24 hour  Intake 520.66 ml  Output 625 ml  Net -104.34 ml   Blood pressure (!) 143/96, pulse (!) 102, temperature 98.4 F (36.9 C), temperature source Temporal, resp. rate (!) 21, height 6' (1.829 m), weight 63.1 kg, SpO2 93 %. Temp:  [97.5 F (36.4 C)-98.4 F (36.9 C)] 98.4 F (36.9 C) (11/03 1038) Pulse Rate:  [83-102] 102 (11/03 1038) Resp:  [14-21] 21 (11/03 1038) BP: (143-177)/(86-122) 143/96  (11/03 1038) SpO2:  [93 %-100 %] 93 % (11/03 1038) Weight:  [63.1 kg] 63.1 kg (11/03 1038)  Physical Exam: General: Alert and awake, oriented x3,cachectic, facial wasting HEENT: anicteric sclera, EOMI CVStachyr rate, normal  No mgr Chest: , no wheezing, no respiratory distress, clear anteiorly Abdomen: soft non-distended,  Extremities: no edema Skin: no rashes Neuro: nonfocal  CBC:    BMET Recent Labs    02/27/19 0538 02/28/19 0432  NA 141 138  K 4.0 4.3  CL 109 107  CO2 25 26  GLUCOSE 107* 104*  BUN 48* 34*  CREATININE 0.53* 0.43*  CALCIUM 8.1* 7.9*     Liver Panel  No results for input(s): PROT, ALBUMIN, AST, ALT, ALKPHOS, BILITOT, BILIDIR, IBILI in the last 72 hours.     Sedimentation Rate No results for input(s): ESRSEDRATE in the last 72 hours. C-Reactive Protein No results for input(s): CRP in the last 72 hours.  Micro Results: Recent Results (from the past 720 hour(s))  Blood Culture (routine x 2)     Status: Abnormal   Collection Time: 02/21/19  8:13 PM   Specimen: BLOOD LEFT FOREARM  Result Value Ref Range Status   Specimen Description   Final    BLOOD LEFT FOREARM Performed at [facility], [address] [place], [place] [address]    Special Requests   Final    BOTTLES DRAWN AEROBIC AND ANAEROBIC Blood Culture results may not be optimal due to an inadequate volume of blood received in culture bottles Performed at [facility] [address], [place], [place] [address]    Culture  Setup Time   Final    GRAM POSITIVE COCCI IN BOTH AEROBIC AND ANAEROBIC BOTTLES CRITICAL RESULT CALLED TO, READ BACK BY AND VERIFIED WITH: PHARMD J LEGGE [id number] AT 1337 BY CM CRITICAL RESULT CALLED TO, READ BACK BY AND VERIFIED WITH: PHARMD E JACKSON [id number] AT 1357 BY CM Performed at [facility], [place] [address] [place], [place] [address]    Culture STAPHYLOCOCCUS AUREUS (A)  Final   Report Status 02/24/2019 FINAL  Final    Organism ID, Bacteria STAPHYLOCOCCUS AUREUS  Final      Susceptibility   Staphylococcus aureus - MIC*    CIPROFLOXACIN <=0.5 SENSITIVE Sensitive     ERYTHROMYCIN >=8 RESISTANT Resistant     GENTAMICIN <=0.5 SENSITIVE Sensitive     OXACILLIN 0.5 SENSITIVE Sensitive     TETRACYCLINE <=1 SENSITIVE Sensitive     VANCOMYCIN 1 SENSITIVE Sensitive     TRIMETH/SULFA <=10 SENSITIVE Sensitive     CLINDAMYCIN <=0.25 SENSITIVE Sensitive     RIFAMPIN <=0.5 SENSITIVE Sensitive     Inducible Clindamycin NEGATIVE Sensitive     * STAPHYLOCOCCUS AUREUS  SARS Coronavirus 2 by RT  PCR (hospital order, performed in [facility] hospital lab) Nasopharyngeal Nasopharyngeal Swab     Status: None   Collection Time: 02/21/19  8:16 PM   Specimen: Nasopharyngeal Swab  Result Value Ref Range Status   SARS Coronavirus 2 NEGATIVE NEGATIVE Final    Comment: (NOTE) If result is NEGATIVE SARS-CoV-2 target nucleic acids are NOT DETECTED. The SARS-CoV-2 RNA is generally detectable in upper and lower  respiratory specimens during the acute phase of infection. The lowest  concentration of SARS-CoV-2 viral copies this assay can detect is 250  copies / mL. A negative result does not preclude SARS-CoV-2 infection  and should not be used as the sole basis for treatment or other  patient management decisions.  A negative result may occur with  improper specimen collection / handling, submission of specimen other  than nasopharyngeal swab, presence of viral mutation(s) within the  areas targeted by this assay, and inadequate number of viral copies  (<250 copies / mL). A negative result must be combined with clinical  observations, patient history, and epidemiological information. If result is POSITIVE SARS-CoV-2 target nucleic acids are DETECTED. The SARS-CoV-2 RNA is generally detectable in upper and lower  respiratory specimens dur ing the acute phase of infection.  Positive  results are indicative of active infection  with SARS-CoV-2.  Clinical  correlation with patient history and other diagnostic information is  necessary to determine patient infection status.  Positive results do  not rule out bacterial infection or co-infection with other viruses. If result is PRESUMPTIVE POSTIVE SARS-CoV-2 nucleic acids MAY BE PRESENT.   A presumptive positive result was obtained on the submitted specimen  and confirmed on repeat testing.  While 2019 novel coronavirus  (SARS-CoV-2) nucleic acids may be present in the submitted sample  additional confirmatory testing may be necessary for epidemiological  and / or clinical management purposes  to differentiate between  SARS-CoV-2 and other Sarbecovirus currently known to infect humans.  If clinically indicated additional testing with an alternate test  methodology [phone]) is advised. The SARS-CoV-2 RNA is generally  detectable in upper and lower respiratory sp ecimens during the acute  phase of infection. The expected result is Negative. Fact Sheet for Patients:  StrictlyIdeas.no Fact Sheet for Healthcare Providers: BankingDealers.co.za This test is not yet approved or cleared by the [place] FDA and has been authorized for detection and/or diagnosis of SARS-CoV-2 by FDA under an Emergency Use Authorization (EUA).  This EUA will remain in effect (meaning this test can be used) for the duration of the COVID-19 declaration under Section 564(b)(1) of the Act, 21 U.S.C. section 360bbb-3(b)(1), unless the authorization is terminated or revoked sooner. Performed at [facility], [place] [address], [place], [place] [address]   MRSA PCR Screening     Status: None   Collection Time: 02/22/19  4:56 AM   Specimen: Nasal Mucosa; Nasopharyngeal  Result Value Ref Range Status   MRSA by PCR NEGATIVE NEGATIVE Final    Comment:        The GeneXpert MRSA Assay (FDA approved for NASAL specimens only), is  one component of a comprehensive MRSA colonization surveillance program. It is not intended to diagnose MRSA infection nor to guide or monitor treatment for MRSA infections. Performed at [facility], [place] [address], [place], [place] [address]   Blood Culture (routine x 2)     Status: Abnormal   Collection Time: 02/22/19  5:33 AM   Specimen: BLOOD  Result Value Ref Range  Status   Specimen Description   Final    BLOOD LEFT ANTECUBITAL Performed at [facility] [address] [place], [place] [address]    Special Requests   Final    BOTTLES DRAWN AEROBIC AND ANAEROBIC Blood Culture adequate volume Performed at [facility] [address] [place], [place] [address]    Culture  Setup Time   Final    GRAM POSITIVE COCCI ANAEROBIC BOTTLE ONLY CRITICAL RESULT CALLED TO, READ BACK BY AND VERIFIED WITH: PHARMD E JACKSON [id number] AT 1108 BY CM    Culture (A)  Final    STAPHYLOCOCCUS AUREUS SUSCEPTIBILITIES PERFORMED ON PREVIOUS CULTURE WITHIN THE LAST 5 DAYS. Performed at [facility], [place] [address], [place], [place] [address]    Report Status 02/24/2019 FINAL  Final  Aerobic/Anaerobic Culture (surgical/deep wound)     Status: None   Collection Time: 02/22/19 10:05 AM   Specimen: Abscess  Result Value Ref Range Status   Specimen Description   Final    ABSCESS RT PSOAS Performed at [facility] [address] [place], [place] [address]    Special Requests   Final    NONE Performed at [facility], [place] [address], [place], [place] [address]    Gram Stain   Final    RARE WBC PRESENT,BOTH PMN AND MONONUCLEAR FEW GRAM POSITIVE COCCI IN PAIRS    Culture   Final    MODERATE STAPHYLOCOCCUS AUREUS NO ANAEROBES ISOLATED Performed at [facility], [place] [address], [place], [place] [address]    Report Status 02/27/2019 FINAL  Final   Organism ID, Bacteria STAPHYLOCOCCUS  AUREUS  Final      Susceptibility   Staphylococcus aureus - MIC*    CIPROFLOXACIN <=0.5 SENSITIVE Sensitive     ERYTHROMYCIN >=8 RESISTANT Resistant     GENTAMICIN <=0.5 SENSITIVE Sensitive     OXACILLIN 0.5 SENSITIVE Sensitive     TETRACYCLINE <=1 SENSITIVE Sensitive     VANCOMYCIN <=0.5 SENSITIVE Sensitive     TRIMETH/SULFA <=10 SENSITIVE Sensitive     CLINDAMYCIN <=0.25 SENSITIVE Sensitive     RIFAMPIN <=0.5 SENSITIVE Sensitive     Inducible Clindamycin NEGATIVE Sensitive     * MODERATE STAPHYLOCOCCUS AUREUS  Culture, respiratory (non-expectorated)     Status: None   Collection Time: 02/22/19  5:30 PM   Specimen: Bronchoalveolar Lavage; Respiratory  Result Value Ref Range Status   Specimen Description   Final    BRONCHIAL ALVEOLAR LAVAGE Performed at [facility] [address], [place], [place] [address]    Special Requests   Final    NONE Performed at [facility], [place] [address] [place], [place] [address]    Gram Stain   Final    ABUNDANT WBC PRESENT, PREDOMINANTLY PMN MODERATE GRAM POSITIVE COCCI Performed at [facility], [place] [address] [place], [place] [address]    Culture MODERATE STAPHYLOCOCCUS AUREUS  Final   Report Status 02/25/2019 FINAL  Final   Organism ID, Bacteria STAPHYLOCOCCUS AUREUS  Final      Susceptibility   Staphylococcus aureus - MIC*    CIPROFLOXACIN <=0.5 SENSITIVE Sensitive     ERYTHROMYCIN >=8 RESISTANT Resistant     GENTAMICIN <=0.5 SENSITIVE Sensitive     OXACILLIN 0.5 SENSITIVE Sensitive     TETRACYCLINE <=1 SENSITIVE Sensitive     VANCOMYCIN 1 SENSITIVE Sensitive     TRIMETH/SULFA <=10 SENSITIVE Sensitive  CLINDAMYCIN <=0.25 SENSITIVE Sensitive     RIFAMPIN <=0.5 SENSITIVE Sensitive     Inducible Clindamycin NEGATIVE Sensitive     * MODERATE STAPHYLOCOCCUS AUREUS  Culture, blood (Routine X 2) w Reflex to ID Panel     Status: None (Preliminary result)   Collection Time: 02/24/19 11:47 AM    Specimen: BLOOD LEFT HAND  Result Value Ref Range Status   Specimen Description   Final    BLOOD LEFT HAND Performed at [facility] [address] [place], [place] [address]    Special Requests   Final    BOTTLES DRAWN AEROBIC AND ANAEROBIC Blood Culture adequate volume Performed at [facility] [address] [place], [place] [address]    Culture   Final    NO GROWTH 4 DAYS Performed at [facility], [place] [address], [place], [place] [address]    Report Status PENDING  Incomplete    Studies/Results: No results found.    Assessment/Plan:  INTERVAL HISTORY pt to get TEE today   Principal Problem:   Sepsis ([place]) Active Problems:   Heroin use   Endocarditis   Septic pulmonary embolism (HCC)   Osteomyelitis of thoracic spine (HCC)   Osteomyelitis of lumbar spine (HCC)   Acute encephalopathy   AKI (acute kidney injury) ([place])   Transaminitis   Psoas muscle abscess (HCC)   Pressure injury of skin   Mucus clot in bronchi   Protein-calorie malnutrition, severe   Abnormal echocardiogram   MSSA bacteremia    Franklin Anderson is a 48 y.o. 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  Specific aspects of clinical management are critical to optimizing the outcome of patients with SAB.  Therefore, the [facility] Antimicrobial Management Team [facility]) has initiated an intervention aimed at improving the management of SAB at [facility].  To do so, Infectious Diseases physicians are providing an evidence-based consult for the management of all patients with SAB.     Yes No Comments  Perform  follow-up blood cultures (even if the patient is afebrile) to ensure clearance of bacteremia _0  _1  Need to repeat AGAIN post removal of central line  Remove vascular catheter and obtain follow-up blood cultures after the removal of the catheter _2  _3  CENTRA LINE NEEDS TO COME OUT and would do so after TEE today  Perform echocardiography to evaluate for endocarditis (transthoracic ECHO is 40-50% sensitive, TEE is > 90% sensitive) _4  _5  TEE pending  Consult electrophysiologist to evaluate implanted cardiac device (pacemaker, ICD) _6  _7    Ensure source control _8  _9  Need to monitor Neuro exam  Investigate for metastatic sites of infection _10  _11  Does the patient have ANY symptom or physical exam finding that would suggest a deeper infection (back or neck pain that may be suggestive of vertebral osteomyelitis or epidural abscess, muscle pain that could be a symptom of pyomyositis)?  Keep in mind that for deep seeded infections MRI imaging with contrast is preferred rather than other often insensitive tests such as plain x-rays, especially early in a patient's presentation.  Change antibiotic therapy to cefazolin _12  _13  Beta-lactam antibiotics are preferred for MSSA due to higher cure rates.  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

## 2019-02-28 NOTE — Anesthesia Postprocedure Evaluation (Addendum)
Anesthesia Post Note  Patient: Franklin Anderson  Procedure(s) Performed: TRANSESOPHAGEAL ECHOCARDIOGRAM (TEE) (N/A )     Patient location during evaluation: PACU Anesthesia Type: MAC Level of consciousness: awake and alert Pain management: pain level controlled Vital Signs Assessment: post-procedure vital signs reviewed and stable Respiratory status: spontaneous breathing, nonlabored ventilation and respiratory function stable Cardiovascular status: stable and blood pressure returned to baseline Anesthetic complications: no    Last Vitals:  Vitals:   02/28/19 1330 02/28/19 1347  BP: (!) 160/111   Pulse: (!) 104 (!) 109  Resp: 18 (!) 21  Temp: 36.6 C   SpO2: 96% 96%    Last Pain:  Vitals:   02/28/19 1330  TempSrc: Axillary  PainSc:                  Audry Pili

## 2019-02-28 NOTE — Progress Notes (Signed)
  Echocardiogram Echocardiogram Transesophageal has been performed.  Burnett Kanaris 02/28/2019, 11:43 AM

## 2019-02-28 NOTE — Interval H&P Note (Signed)
History and Physical Interval Note:  02/28/2019 10:18 AM  Franklin Anderson  has presented today for surgery, with the diagnosis of r/o bacteremia.  The various methods of treatment have been discussed with the patient and family. After consideration of risks, benefits and other options for treatment, the patient has consented to  Procedure(s): TRANSESOPHAGEAL ECHOCARDIOGRAM (TEE) (N/A) as a surgical intervention.  The patient's history has been reviewed, patient examined, no change in status, stable for surgery.  I have reviewed the patient's chart and labs.  Questions were answered to the patient's satisfaction.     Mertie Moores

## 2019-02-28 NOTE — Progress Notes (Signed)
OT Cancellation Note  Patient Details Name: ABDULKAREEM BADOLATO MRN: [id number] DOB: November 22, 1970   Cancelled Treatment:    Reason Eval/Treat Not Completed: Patient at procedure or test/ unavailable;Active bedrest order  Lesleyann Fichter 02/28/2019, 11:22 AM  Lesle Chris, OTR/L Acute Rehabilitation Services [phone] [facility] pager [phone] office 02/28/2019

## 2019-02-28 NOTE — Progress Notes (Addendum)
PROGRESS NOTE    Franklin Anderson  ZTI:[id number]  DOB: Aug 05, 1970  DOA: 02/21/2019 PCP: Mack Hook, MD  Brief Narrative:  48 y.o. male with medical history significant of rheumatic fever, HTN, ongoing IV heroin abuse, presented to the ED on 10/27 with 3 week course of progressively worsening back pain and BLE weakness associated with some bowel incontinence and trouble walking. Last use of heroin was on the day of admission. ED Course: Found to have:sepsis with Tm 100, HR 126, RR 31, WBC 22.4k,AKI with BUN 111 and creat 1.7. Imaging studies revealed osteomyelitis and diskitis of L4-L5,septic pulmonary emboli and B psoas abscesses. Patient recieved 2L NS bolus, put on cefepime / vanc / flagyl initially, admitted to [facility] for Mx of sepsis and associated metabolic encephalopathy.2x Blood culture 10/27>> MSSA. Patient became more obtunded and dyspneic on 10/28 , PCCM consulted-intubated --extubated on 10/31. Patient underwent R psoas abscess aspiration on 10/28. Wound culture>> MSSA.  Respiratory culture from 10/28 also growing MSSA.  Echo revealed Grade 1 diastolic dysfunction, preserved EF and possible mitral valve vegetation. TEE recommended. CXR suggestive of septic emboli and MRI head showed abnormal dural enhancement. ID/Cardiology following. HC additionally complicated by hypernatremia, persistent uremia, pericardial effusion (did not look too bad on bedside US 10/29 per PCCM) and severe deconditioning. Abx course--Cefepime10/28 >> 10/29 ,Vanc  10/28 >> 10/30 Cefazolin 10/30>>  Subjective:  Patient seen this morning in rounds.  He is n.p.o. for TEE.  Hemoglobin down to 7.1 today but denies any melena/hematochezia or epistaxis or hematuria or other signs of bleeding.  Also discussed with bedside nurse who confirmed that patient has only had brown/green stool via rectal tube.  Blood pressure elevated this morning.  Objective: Vitals:   02/28/19 1600 02/28/19 1625 02/28/19 1638  02/28/19 1649  BP: (!) 162/116 (!) 166/118  (!) 165/124  Pulse: (!) 59 (!) 119 (!) 115 (!) 117  Resp: (!) 27 (!) 26 (!) 32 (!) 27  Temp:  98.2 F (36.8 C)    TempSrc:  Oral    SpO2: 96% 100% 99% 100%  Weight:      Height:        Intake/Output Summary (Last 24 hours) at 02/28/2019 1734 Last data filed at 02/28/2019 1638 Gross per 24 hour  Intake 1258.32 ml  Output 500 ml  Net 758.32 ml   Filed Weights   02/26/19 0424 02/28/19 0500 02/28/19 1038  Weight: 63.3 kg 63.1 kg 63.1 kg    Physical Examination:  General exam: Appears comfortable and in no respiratory distress Respiratory system: Clear to auscultation. Respiratory effort normal. Cardiovascular system: S1 & S2 heard, RRR. No JVD, murmurs, rubs, gallops or clicks. No pedal edema. Gastrointestinal system: Abdomen is nondistended, soft and nontender. No organomegaly or masses felt. Normal bowel sounds heard. Central nervous system: Alert and oriented. No focal neurological deficits. Extremities: Symmetric 5 x 5 power. Skin: No rashes, lesions or ulcers Psychiatry: Judgement and insight appear normal. Mood & affect irritable due to n.p.o. status    Data Reviewed: I have personally reviewed following labs and imaging studies  CBC: Recent Labs  Lab 02/21/19 2013  02/24/19 0415 02/25/19 0440 02/26/19 0222 02/27/19 0538 02/28/19 0432 02/28/19 0910  WBC 22.4*   < > 12.8* 13.4* 11.1* 12.3* 9.4  --   NEUTROABS 20.6*  --   --   --   --   --   --   --   HGB 10.6*   < > 8.5* 8.8* 7.5* 7.8* 7.1*  7.9*  HCT 32.1*   < > 27.1* 28.4* 24.9* 25.3* 23.4* 25.6*  MCV 85.8   < > 90.6 91.3 92.6 92.0 92.5  --   PLT 263   < > 191 235 243 252 182  --    < > = values in this interval not displayed.   Basic Metabolic Panel: Recent Labs  Lab 02/22/19 1725  02/23/19 0802 02/23/19 1700 02/24/19 0415 02/25/19 0440 02/26/19 0222 02/26/19 0707 02/27/19 0538 02/28/19 0432  NA  --    < > 135  --  141 148*  --  146* 141 138  K  --    <  > 3.8  --  3.4* 4.0  --  3.5 4.0 4.3  CL  --    < > 103  --  108 113*  --  113* 109 107  CO2  --    < > 24  --  26 27  --  _0 GLUCOSE  --    < > 130*  --  158* 138*  --  130* 107* 104*  BUN  --    < > 102*  --  88* 64*  --  69* 48* 34*  CREATININE  --    < > 1.25*  --  0.80 0.61  --  0.55* 0.53* 0.43*  CALCIUM  --    < > 7.8*  --  7.8* 8.1*  --  8.1* 8.1* 7.9*  MG 2.8*  --  3.2* 3.1* 2.8* 2.5* 2.1  --  1.7  --   PHOS 5.8*  --  6.6* 4.5 3.6  --   --   --   --   --    < > = values in this interval not displayed.   GFR: Estimated Creatinine Clearance: 100.8 mL/min (A) (by C-G formula based on SCr of 0.43 mg/dL (L)). Liver Function Tests: Recent Labs  Lab 02/22/19 0533 02/22/19 1803 02/23/19 0802 02/24/19 0415 02/25/19 0440  AST 72* 74* 39 26 23  ALT 68* 65* 47* 39 28  ALKPHOS 137* 121 93 88 81  BILITOT 1.8* 1.7* 0.9 0.8 0.6  PROT 6.4* 6.5 5.9* 6.2* 6.4*  ALBUMIN 1.7* 1.7* 1.5* 1.5* 1.4*   No results for input(s): LIPASE, AMYLASE in the last 168 hours. No results for input(s): AMMONIA in the last 168 hours. Coagulation Profile: Recent Labs  Lab 02/21/19 2013 02/22/19 1850  INR 1.4* 1.7*   Cardiac Enzymes: No results for input(s): CKTOTAL, CKMB, CKMBINDEX, TROPONINI in the last 168 hours. BNP (last 3 results) No results for input(s): PROBNP in the last 8760 hours. HbA1C: Recent Labs    02/28/19 0910  HGBA1C 6.4*   CBG: Recent Labs  Lab 02/27/19 1957 02/28/19 0015 02/28/19 0411 02/28/19 0755 02/28/19 1543  GLUCAP 147* 80 94 110* 172*   Lipid Profile: Recent Labs    02/26/19 0222  TRIG 60   Thyroid Function Tests: No results for input(s): TSH, T4TOTAL, FREET4, T3FREE, THYROIDAB in the last 72 hours. Anemia Panel: Recent Labs    02/28/19 0910  FERRITIN 685*  TIBC 133*  IRON 50   Sepsis Labs: Recent Labs  Lab 02/21/19 2013 02/21/19 2213  LATICACIDVEN 2.1* 1.1    Recent Results (from the past 240 hour(s))  Blood Culture (routine x 2)      Status: Abnormal   Collection Time: 02/21/19  8:13 PM   Specimen: BLOOD LEFT FOREARM  Result Value Ref Range Status   Specimen Description  Final    BLOOD LEFT FOREARM Performed at [facility], [place] [address] [place], [place] [address]    Special Requests   Final    BOTTLES DRAWN AEROBIC AND ANAEROBIC Blood Culture results may not be optimal due to an inadequate volume of blood received in culture bottles Performed at [place] [address] [place], [place] [address]    Culture  Setup Time   Final    GRAM POSITIVE COCCI IN BOTH AEROBIC AND ANAEROBIC BOTTLES CRITICAL RESULT CALLED TO, READ BACK BY AND VERIFIED WITH: PHARMD J LEGGE [id number] AT 1337 BY CM CRITICAL RESULT CALLED TO, READ BACK BY AND VERIFIED WITH: PHARMD E JACKSON [id number] AT 1357 BY CM Performed at [facility], [place] [address] [place], [place] [address]    Culture STAPHYLOCOCCUS AUREUS (A)  Final   Report Status 02/24/2019 FINAL  Final   Organism ID, Bacteria STAPHYLOCOCCUS AUREUS  Final      Susceptibility   Staphylococcus aureus - MIC*    CIPROFLOXACIN <=0.5 SENSITIVE Sensitive     ERYTHROMYCIN >=8 RESISTANT Resistant     GENTAMICIN <=0.5 SENSITIVE Sensitive     OXACILLIN 0.5 SENSITIVE Sensitive     TETRACYCLINE <=1 SENSITIVE Sensitive     VANCOMYCIN 1 SENSITIVE Sensitive     TRIMETH/SULFA <=10 SENSITIVE Sensitive     CLINDAMYCIN <=0.25 SENSITIVE Sensitive     RIFAMPIN <=0.5 SENSITIVE Sensitive     Inducible Clindamycin NEGATIVE Sensitive     * STAPHYLOCOCCUS AUREUS  SARS Coronavirus 2 by RT PCR (hospital order, performed in [place] hospital lab) Nasopharyngeal Nasopharyngeal Swab     Status: None   Collection Time: 02/21/19  8:16 PM   Specimen: Nasopharyngeal Swab  Result Value Ref Range Status   SARS Coronavirus 2 NEGATIVE NEGATIVE Final    Comment: (NOTE) If result is NEGATIVE SARS-CoV-2 target nucleic acids are NOT DETECTED. The SARS-CoV-2 RNA is generally  detectable in upper and lower  respiratory specimens during the acute phase of infection. The lowest  concentration of SARS-CoV-2 viral copies this assay can detect is 250  copies / mL. A negative result does not preclude SARS-CoV-2 infection  and should not be used as the sole basis for treatment or other  patient management decisions.  A negative result may occur with  improper specimen collection / handling, submission of specimen other  than nasopharyngeal swab, presence of viral mutation(s) within the  areas targeted by this assay, and inadequate number of viral copies  (<250 copies / mL). A negative result must be combined with clinical  observations, patient history, and epidemiological information. If result is POSITIVE SARS-CoV-2 target nucleic acids are DETECTED. The SARS-CoV-2 RNA is generally detectable in upper and lower  respiratory specimens dur ing the acute phase of infection.  Positive  results are indicative of active infection with SARS-CoV-2.  Clinical  correlation with patient history and other diagnostic information is  necessary to determine patient infection status.  Positive results do  not rule out bacterial infection or co-infection with other viruses. If result is PRESUMPTIVE POSTIVE SARS-CoV-2 nucleic acids MAY BE PRESENT.   A presumptive positive result was obtained on the submitted specimen  and confirmed on repeat testing.  While 2019 novel coronavirus  (SARS-CoV-2) nucleic acids may be present in the submitted sample  additional confirmatory testing may be necessary for epidemiological  and / or clinical management purposes  to differentiate between  SARS-CoV-2 and other Sarbecovirus currently known to  infect humans.  If clinically indicated additional testing with an alternate test  methodology [phone]) is advised. The SARS-CoV-2 RNA is generally  detectable in upper and lower respiratory sp ecimens during the acute  phase of infection. The  expected result is Negative. Fact Sheet for Patients:  StrictlyIdeas.no Fact Sheet for Healthcare Providers: BankingDealers.co.za This test is not yet approved or cleared by the [place] FDA and has been authorized for detection and/or diagnosis of SARS-CoV-2 by FDA under an Emergency Use Authorization (EUA).  This EUA will remain in effect (meaning this test can be used) for the duration of the COVID-19 declaration under Section 564(b)(1) of the Act, 21 U.S.C. section 360bbb-3(b)(1), unless the authorization is terminated or revoked sooner. Performed at [facility], [place] [address] [place], [place] [address]   MRSA PCR Screening     Status: None   Collection Time: 02/22/19  4:56 AM   Specimen: Nasal Mucosa; Nasopharyngeal  Result Value Ref Range Status   MRSA by PCR NEGATIVE NEGATIVE Final    Comment:        The GeneXpert MRSA Assay (FDA approved for NASAL specimens only), is one component of a comprehensive MRSA colonization surveillance program. It is not intended to diagnose MRSA infection nor to guide or monitor treatment for MRSA infections. Performed at [facility], [place] [address], [place], [place] [address]   Blood Culture (routine x 2)     Status: Abnormal   Collection Time: 02/22/19  5:33 AM   Specimen: BLOOD  Result Value Ref Range Status   Specimen Description   Final    BLOOD LEFT ANTECUBITAL Performed at [place] [address] [place], [place] [address]    Special Requests   Final    BOTTLES DRAWN AEROBIC AND ANAEROBIC Blood Culture adequate volume Performed at [place] [address] [place], [place] [address]    Culture  Setup Time   Final    GRAM POSITIVE COCCI ANAEROBIC BOTTLE ONLY CRITICAL RESULT CALLED TO, READ BACK BY AND VERIFIED WITH: PHARMD E JACKSON [id number] AT 1108 BY CM    Culture (A)  Final     STAPHYLOCOCCUS AUREUS SUSCEPTIBILITIES PERFORMED ON PREVIOUS CULTURE WITHIN THE LAST 5 DAYS. Performed at [facility], [place] [address] [place], [place] [address]    Report Status 02/24/2019 FINAL  Final  Aerobic/Anaerobic Culture (surgical/deep wound)     Status: None   Collection Time: 02/22/19 10:05 AM   Specimen: Abscess  Result Value Ref Range Status   Specimen Description   Final    ABSCESS RT PSOAS Performed at [place] [address] [place], [place] [address]    Special Requests   Final    NONE Performed at [facility], [place] [address], [place], [place] [address]    Gram Stain   Final    RARE WBC PRESENT,BOTH PMN AND MONONUCLEAR FEW GRAM POSITIVE COCCI IN PAIRS    Culture   Final    MODERATE STAPHYLOCOCCUS AUREUS NO ANAEROBES ISOLATED Performed at [facility], [place] [address] [place], [place] [address]    Report Status 02/27/2019 FINAL  Final   Organism ID, Bacteria STAPHYLOCOCCUS AUREUS  Final      Susceptibility   Staphylococcus aureus - MIC*    CIPROFLOXACIN <=0.5 SENSITIVE Sensitive     ERYTHROMYCIN >=8 RESISTANT Resistant     GENTAMICIN <=0.5 SENSITIVE Sensitive     OXACILLIN 0.5  SENSITIVE Sensitive     TETRACYCLINE <=1 SENSITIVE Sensitive     VANCOMYCIN <=0.5 SENSITIVE Sensitive     TRIMETH/SULFA <=10 SENSITIVE Sensitive     CLINDAMYCIN <=0.25 SENSITIVE Sensitive     RIFAMPIN <=0.5 SENSITIVE Sensitive     Inducible Clindamycin NEGATIVE Sensitive     * MODERATE STAPHYLOCOCCUS AUREUS  Culture, respiratory (non-expectorated)     Status: None   Collection Time: 02/22/19  5:30 PM   Specimen: Bronchoalveolar Lavage; Respiratory  Result Value Ref Range Status   Specimen Description   Final    BRONCHIAL ALVEOLAR LAVAGE Performed at [place] [address], [place], [place] [address]    Special Requests   Final    NONE Performed at [facility], [place] [address], [place], [place] [address]    Gram Stain   Final    ABUNDANT WBC PRESENT, PREDOMINANTLY PMN MODERATE GRAM POSITIVE COCCI Performed at [facility], [place] [address] [place], [place] [address]    Culture MODERATE STAPHYLOCOCCUS AUREUS  Final   Report Status 02/25/2019 FINAL  Final   Organism ID, Bacteria STAPHYLOCOCCUS AUREUS  Final      Susceptibility   Staphylococcus aureus - MIC*    CIPROFLOXACIN <=0.5 SENSITIVE Sensitive     ERYTHROMYCIN >=8 RESISTANT Resistant     GENTAMICIN <=0.5 SENSITIVE Sensitive     OXACILLIN 0.5 SENSITIVE Sensitive     TETRACYCLINE <=1 SENSITIVE Sensitive     VANCOMYCIN 1 SENSITIVE Sensitive     TRIMETH/SULFA <=10 SENSITIVE Sensitive     CLINDAMYCIN <=0.25 SENSITIVE Sensitive     RIFAMPIN <=0.5 SENSITIVE Sensitive     Inducible Clindamycin NEGATIVE Sensitive     * MODERATE STAPHYLOCOCCUS AUREUS  Culture, blood (Routine X 2) w Reflex to ID Panel     Status: None (Preliminary result)   Collection Time: 02/24/19 11:47 AM   Specimen: BLOOD LEFT HAND  Result Value Ref Range Status   Specimen Description   Final    BLOOD LEFT HAND Performed at [facility], [place] [address], [place], [place] [address]    Special Requests   Final    BOTTLES DRAWN AEROBIC AND ANAEROBIC Blood Culture adequate volume Performed at [place] [address] [place], [place] [address]    Culture   Final    NO GROWTH 4 DAYS Performed at [facility], [place] [address], [place], [place] [address]    Report Status PENDING  Incomplete      Radiology Studies: No results found.      Scheduled Meds: . amLODipine  10 mg Oral Daily  . Chlorhexidine Gluconate Cloth  6 each Topical Daily  . feeding supplement (ENSURE ENLIVE)  237 mL Oral BID BM  . heparin injection (subcutaneous)  5,000 Units Subcutaneous Q8H  . hydrALAZINE  25 mg Oral Q8H  . insulin aspart  0-24 Units Subcutaneous Q4H  . ipratropium  0.5 mg Nebulization BID   . levalbuterol  1.25 mg Nebulization BID  . mouth rinse  15 mL Mouth Rinse BID  . multivitamin with minerals  1 tablet Oral Daily  . pantoprazole  40 mg Oral Daily  . sodium chloride flush  10-40 mL Intracatheter Q12H   Continuous Infusions: . sodium chloride 20 mL/hr at 02/28/19 1008  .  ceFAZolin (ANCEF) IV Stopped (02/28/19 1347)  . sodium chloride      Assessment & Plan:    1.  MSSA bacteremia, sepsis with  septic pulmonary emboli: Due to IV drug abuse/discitis/vertebral osteomyelitis/psoas abscesses. Blood cultures/psoas cultures and respiratory cultures growing MSSA.  Repeat blood cultures from 10/30 x 1 negative so far.Continue IV cefazolin.  Leukocytosis 12-13 K.  Afebrile.  Will plan on PICC line once cleared by ID.  Transported to [place] for TEE to rule out endocarditis today--> apparently procedure uncomplicated per cardiology.  Questionable mural vegetation/ endocarditis-Dr. Farris Has to review again and comment-Will await official report.  2.  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  Cultures growing MSSA.  Continue antibiotics as above.  Patient denies any back pain at rest or on moving lower extremities.  3.  Metabolic encephalopathy: Present on admission in the setting of problem #1, dehydration/AKI/uremia and IV drug use. Improved.  Awake alert oriented x3 now.  MRI head showed abnormal dural enhancement.  Patient could have had meningoencephalitis on presentation.  4.  Hypernatremia: Now improved sodium levels with IV hydration.  Eating and drinking well.  Started on KVO fluids as n.p.o. and plan to undergo TEE today.  Will DC post procedure to avoid worsening of problem #6  5.  AKI, uremia: Much improved with IV hydration.  BUN peaked to 118 during this hospital course and now down to 40s.  Mental status improved.    6.  Pericardial effusion: Not very concerning on bedside ultrasonogram per PCCM.   Reported to be moderate on TEE today without any signs of tamponade.  Not sure if related to uremic pericarditis versus reactive to infection.  DC IV fluids post procedure.  7.  Acute hypoxic respiratory failure: Patient had AMS, dyspnea, increased secretions on presentation and had to be intubated.  Now extubated and saturating well on 3 L nasal cannula.  Chest x-ray suggestive of multiple pulmonary cavities with left lower lobe pneumonia and small pleural effusion, findings suggestive of septic emboli.  Respiratory cultures growing MSSA.  Continue antibiotics, taper O2 as tolerated.  8.Uncontrolled hypertension: Increase Norvasc dosage early this morning and added hydralazine 25 mg 3 times daily.  Patient had labetalol as needed available.  9.  Normocytic anemia: Could be anemia of chronic disease versus hemolytic anemia in the setting of endocarditis.  Labs earlier this morning showed hemoglobin of 7.1.  Repeat hemoglobin and 1 unit of PRBC ordered to be transfused.  No obvious signs of bleeding.  Rectal tube with dark green stool.  Will send stool for occult blood although could be positive in the setting of having a rectal tube in place.  Iron studies ordered.  Repeat hemoglobin at 9 AM (prior to transfusion) resulted as 7.9 (stable from 7.8 yesterday)  10. Generalized muscle weakness/deconditioning: PT evaluation  DVT prophylaxis: Heparin subcu Code Status: Partial (okay to intubate, no CPR) Family / Patient Communication: Discussed with patient Disposition Plan: TBD  Addendum 5.30 pm: Patient had transfer orders to telemetry floor since morning, receiving nurse concerned about sinus tachycardia, uncontrolled blood pressure and patient getting Ativan earlier for anxiety/cough. Discussed with current bedside nurse, modified antihypertensives to Norvasc 10 mg, metoprolol 25 mg twice daily and change to  Hydralazine PO to hydralazine IV as needed.  DC IV fluids.  Addendum 6.15 pm : On my  evaluation, patient does appear tachypneic. He denies any chest pain. Saturating well on 3lits [place]. On ausculation -upper airway sounds, no crepitus on ACW.Marland Kitchen Per nurse patient received 1 unit PRBC earlier. Will give 1 dose of lasix IV as post transfusion,  suction for possible aspiration during the procedure, aspiration precuations, ordered stat CXR to r/o any perforation/aspiration infiltrates/edema- Nurse will page on call with results. D/W emergency contact , Shanon Brow, who is at bedside and updated of plan. Will monitor on stepdown status for tonight  Addendum 6.50 pm : D/W cardiology Dr Marisue Ivan regarding above , he recommended CT PE protocol to r/o mural vegetation embolizing although patient already has known septic emboli  (seen on MRI spine done previously, did not have dedicated CT chest) and wouldn't change management as infectious emboli.Cardiology plans to formally evaluate patient in am. Endorsed to on-call APP to f/u on results  Additional critical care time spent :45 minutes   LOS: 6 days    Time spent: 35 minutes    [place]ford Shi, MD [facility] Pager [phone]  If 7PM-7AM, please contact night-coverage [email or web address] Password [facility] 02/28/2019, 5:34 PM

## 2019-02-28 NOTE — Anesthesia Preprocedure Evaluation (Signed)
Anesthesia Evaluation  Patient identified by MRN, date of birth, ID band Patient awake    Reviewed: Allergy & Precautions, NPO status , Patient's Chart, lab work & pertinent test results  Airway Mallampati: I  TM Distance: >3 FB Neck ROM: Full    Dental   Pulmonary former smoker,    Pulmonary exam normal        Cardiovascular hypertension, Pt. on medications Normal cardiovascular exam     Neuro/Psych    GI/Hepatic   Endo/Other    Renal/GU      Musculoskeletal   Abdominal   Peds  Hematology   Anesthesia Other Findings   Reproductive/Obstetrics                             Anesthesia Physical Anesthesia Plan  ASA: III  Anesthesia Plan: MAC   Post-op Pain Management:    Induction: Intravenous  PONV Risk Score and Plan: 1 and Treatment may vary due to age or medical condition  Airway Management Planned: Nasal Cannula  Additional Equipment:   Intra-op Plan:   Post-operative Plan:   Informed Consent: I have reviewed the patients History and Physical, chart, labs and discussed the procedure including the risks, benefits and alternatives for the proposed anesthesia with the patient or authorized representative who has indicated his/her understanding and acceptance.       Plan Discussed with: CRNA and Surgeon  Anesthesia Plan Comments:         Anesthesia Quick Evaluation

## 2019-02-28 NOTE — Progress Notes (Signed)
ANTICOAGULATION CONSULT NOTE - Initial Consult  Pharmacy Consult for Heparin Indication: pulmonary embolus  Allergies  Allergen Reactions  . Penicillins Other (See Comments)    Patient Measurements: Height: 6' (182.9 cm) Weight: 139 lb 1.8 oz (63.1 kg) IBW/kg (Calculated) : 77.6 Heparin Dosing Weight:   Vital Signs: Temp: 98.5 F (36.9 C) (11/03 2000) Temp Source: Oral (11/03 2000) BP: 151/103 (11/03 1800) Pulse Rate: 104 (11/03 1800)  Labs: Recent Labs    02/26/19 0222 02/26/19 0707 02/27/19 0538 02/28/19 0432 02/28/19 0910 02/28/19 2023  HGB 7.5*  --  7.8* 7.1* 7.9* 9.4*  HCT 24.9*  --  25.3* 23.4* 25.6* 29.6*  PLT 243  --  252 182  --   --   CREATININE  --  0.55* 0.53* 0.43*  --   --     Estimated Creatinine Clearance: 100.8 mL/min (A) (by C-G formula based on SCr of 0.43 mg/dL (L)).   Medical History: Past Medical History:  Diagnosis Date  . Drug abuse ([place])   . H/O: rheumatic fever    Diagnosed at 48 y/o.  Resolved at 48 y/o.  States he was diagnosed with heart murmur felt due to this  . Hypertension 2012    Medications:  Infusions:  .  ceFAZolin (ANCEF) IV 2 g (02/28/19 2323)  . heparin    . sodium chloride      Assessment: Patient with new PE, NP order heparin per pharmacy for PE.  Baseline PT/PTT ordered.  Patient's 5000 units SQ heparin charted < 10 mins ago.    Goal of Therapy:  Heparin level 0.3-0.7 units/ml Monitor platelets by anticoagulation protocol: Yes   Plan:  No heparin bolus due to recent SQ heparin dose Heparin drip at 1250 units/hr Check heparin level at 0900.  Nani Skillern [place] 02/28/2019,11:28 PM

## 2019-02-28 NOTE — TOC Initial Note (Addendum)
Transition of Care [facility]) - Initial/Assessment Note    Patient Details  Name: Franklin Anderson MRN: [id number] Date of Birth: 12/06/1970  Transition [facility]) CM/SW Contact:    Trish Mage, LCSW Phone Number: 02/28/2019, 2:51 PM  Clinical Narrative:    Franklin Anderson was seen as the result of SA consult.  Admitted daily heroin user for many years-approximately $300.00-400.00 a week habit, wants to quit since he feels like he has been given a second chance.  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 Understands that he has infection of the heart and is very sick.  Focused on starting drug replacement and maintenance therapy-namely methadone.  We talked abut [facility] as a resource.  I told him I would enter their information in his AVS and he should follow up with them the day of d/c.  He stated his brother was also working on a program, and gave me permission to talk to him.  Brother, of course, stated methadone clinic was out of the question as that has been tried before, and he and patient had been talking about 28 day inpatient program, which he through his brother had agreed to.  I asked Franklin Anderson to follow up with the patient about this, and told him about [facility] here in [facility], that may fit the bill if patient is medically stable to get into that program at d/c. I nalso asked Franklin Anderson about pt's adult children.   He stated they both live in [place], both have addiction issues as does their mother, with whom they are in close contact. Franklin Anderson believes he is in the best position to be resource/support for pt., and to act as POA when needed.  Factors that count against patient's on-going sobriety are grief and loss issues-his mother died in 08/03/22 and he was her caregiver to the end, and grandmother's death 7 months previous to that.  Also on-going court issues with child support owed on his now adult children.    Safety factors include his ability to stay employed through  on-going heroin use, cessation of use of alcohol 10 years ago, and the fact he believes, through divine intervention, he has been given another chance to make the correct decisions.  TOC will continue to follow during the course of hospitalization.              Expected Discharge Plan: Home/Self Care Barriers to Discharge: No Barriers Identified   Patient Goals and CMS Choice Patient states their goals for this hospitalization and ongoing recovery are:: "God gave me another chance.  I'm here for a reason and I need to do right."      Expected Discharge Plan and Services Expected Discharge Plan: Home/Self Care In-house Referral: Clinical Social Work     Living arrangements for the past 2 months: Single Family Home                                      Prior Living Arrangements/Services Living arrangements for the past 2 months: Single Family Home Lives with:: Relatives Patient language and need for interpreter reviewed:: Yes Do you feel safe going back to the place where you live?: Yes      Need for Family Participation in Patient Care: Yes (Comment) Care giver support system in place?: Yes (comment)   Criminal Activity/Legal Involvement Pertinent to Current Situation/Hospitalization: No - Comment as needed  Activities of Daily Living Home Assistive Devices/Equipment: None ADL Screening (condition at time of admission) Patient's cognitive ability adequate to safely complete daily activities?: No Is the patient deaf or have difficulty hearing?: No Does the patient have difficulty seeing, even when wearing glasses/contacts?: No Does the patient have difficulty concentrating, remembering, or making decisions?: Yes Patient able to express need for assistance with ADLs?: Yes Does the patient have difficulty dressing or bathing?: Yes Independently performs ADLs?: No Communication: Independent Dressing (OT): Dependent Is this a change from baseline?: Change from  baseline, expected to last >3 days Grooming: Dependent Is this a change from baseline?: Change from baseline, expected to last >3 days Feeding: Dependent Is this a change from baseline?: Change from baseline, expected to last >3 days Bathing: Dependent Is this a change from baseline?: Change from baseline, expected to last >3 days Toileting: Needs assistance Is this a change from baseline?: Change from baseline, expected to last >3days In/Out Bed: Dependent Is this a change from baseline?: Change from baseline, expected to last >3 days Walks in Home: Dependent Is this a change from baseline?: Change from baseline, expected to last >3 days Does the patient have difficulty walking or climbing stairs?: Yes Weakness of Legs: Both Weakness of Arms/Hands: Both  Permission Sought/Granted Permission sought to share information with : Family Supports Permission granted to share information with : Yes, Verbal Permission Granted  Share Information with NAME: Franklin Anderson     Permission granted to share info w Relationship: brother  Permission granted to share info w Contact Information: [phone]  Emotional Assessment Appearance:: Appears stated age Attitude/Demeanor/Rapport: Engaged Affect (typically observed): Appropriate Orientation: : Oriented to Self, Oriented to Place, Oriented to Situation Alcohol / Substance Use: Illicit Drugs Psych Involvement: No (comment)  Admission diagnosis:  Pain [R52] AKI (acute kidney injury) (HCC) [N17.9] Patient Active Problem List   Diagnosis Date Noted  . MSSA bacteremia   . Abnormal echocardiogram   . Protein-calorie malnutrition, severe 02/23/2019  . Sepsis (HCC) 02/22/2019  . Endocarditis 02/22/2019  . Septic pulmonary embolism (HCC) 02/22/2019  . 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 Acute encephalopathy 02/22/2019  . AKI (acute kidney injury) (HCC) 02/22/2019  .  Transaminitis 02/22/2019  . Psoas muscle abscess (HCC) 02/22/2019  . Pressure injury of skin 02/22/2019  . Mucus clot in bronchi   . Drug abuse (HCC)   . H/O: rheumatic fever   . Shortness of breath 10/01/2015  . SOB (shortness of breath) 10/01/2015  . HTN (hypertension) 10/01/2015  . Methadone use (HCC) 10/01/2015  . Heroin use 10/01/2015  . Tobacco abuse 10/01/2015  . Hypertension 04/27/2010   PCP:  Julieanne Manson, MD Pharmacy:    [facility] [address] ([place]), [place] - [address] [place] ([place]) [place] [address] Phone: [phone] Fax: [phone]     Social Determinants of Health (SDOH) Interventions    Readmission Risk Interventions No flowsheet data found.
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
## 2019-02-28 NOTE — Progress Notes (Addendum)
Shift event: NP was called by attending at change of shift and asked to f/up with CXR and CT chest that has already been ordered. We discussed case and changes in pt right before change of shift. Pt was more tachypneic. MD called cardiology to discuss TEE results from earlier today. Cards recommended CT chest to discern TEE findings, which per report, are ? Fibroma vs endocarditis. CXR was ordered by attending.   Shortly after 1900 hrs, NP was called by radiology with CXR results-loculated left pneumothorax, moderate pleural effusion, probably ruptured cavitary lesion and mediastinal shift.   NP to bedside.  Pt with a hx of IVDA, rheumatic fever, and HTN who presented on 02/21/2019 with worsening back pain, BLE weakness with bowel incontinence. Last Heroin IV use was on the day of admission. Found to have osteomyelitis and diskitis of L4-5, septic pulmonary emboli and bilateral psoas abscesses. He was started on IV abx. Pt became obtunded with respiratory failure on 10/28 and was intubated. He was extubated on 10/31. He underwent right psoas abscess aspiration on 10/28. Wound culture revealed MSSA. Echo with Grade 1 diastolic dysfunction and preserved EF with possible mitral valve vegetation. ID following. Pt underwent TEE around 1030 hrs today. His stay has also been complicated by hypernatremia, uremia, pericardial effusion, AKI, and severe deconditioning.  S: pt says he is SOB but doesn't feel it is increased over earlier today. He has back pain. No chest pain.  O: Very poor appearing male in NAD. Cachetic. He is alert, oriented, and appropriate. BP 151/103. HR 113. RR 28-32. SaO2 99% on 3L O2 per [place]. His breathing is shallow and tachypnea noted, but he is not working hard to breathe. BS-good air exchange and clear on the right. Left lobes are greatly diminished to no air exchange at all with pleural rub noted.  A/P: 1. Acute, worsening respiratory distress with respiratory failure during  hospitalization. He was intubated on 10/28 and extubated on 10/31. NP called PCCM to relay CXR findings and change in pt status. They will come and see the pt.  PCCM Dr communicated with NP. The CT needs to be done to discern CXR findings as PCCM thinks pt will likely need IR to place a pigtail. Asked RN to call CT and get that moving along.  Cardiology on call informed of CXR findings and do not think the findings are related to the TEE.   Multiple other medical issues including MSSA bacteremia, osteomyelitis, diskitis, septic pulmonary emboli, psoas abscesses, anemia, AKI improving, pericardial effusion.  Updates to follow. KJKG, NP [facility] Pt wanted his friend, Onalee Hua, called with update and stated he did NOT want his daughter called. Attempted to call Onalee Hua with no answer.  Updated pt with the plan of care and need to get CT results to decide next step. He verbalized understanding.  Reviewed code status with pt and he confirms-NO CPR, but OK with INTUBATION.  Update: CT results were reviewed by PCCM and radiology called NP the formal read. Pt with multiple findings which increase the gravity of this situation. PCCM came and spoke extensively with pt about findings and treatment plan.  See their note. PCCM discussed findings and pt is now a DNR. Palliative care recommended.  Given hydropneumothorax on CT, PCCM recommends CVTS consult in am.   PR interval increased over 5 days ago from .13 to .17. Metoprolol d/c'd given chance of AV block due to lung issues. 12 lead in a.m.  Multiple PE seen on CT with right heart  strain. Heparin started. Benefits outweigh risks at this point but watch carefully for bleeding.   Total critical care time: Start 1935   End 2050  Total of 75 mins   Critical care time was exclusive of separately billable procedures and treating other patients. Critical care was necessary to treat or prevent imminent or life-threatening deterioration. Critical care was time spent  personally by me on the following activities: development of treatment plan with patient and/or surrogate as well as nursing, discussions with consultants, evaluation of patient's response to treatment, examination of patient, obtaining history from patient or surrogate, ordering and performing treatments and interventions, ordering and review of laboratory studies, ordering and review of radiographic studies, pulse oximetry and re-evaluation of patient's condition.

## 2019-02-28 NOTE — Transfer of Care (Signed)
Immediate Anesthesia Transfer of Care Note  Patient: Franklin Anderson  Procedure(s) Performed: TRANSESOPHAGEAL ECHOCARDIOGRAM (TEE) (N/A )  Patient Location: PACU and Endoscopy Unit  Anesthesia Type:MAC  Level of Consciousness: drowsy  Airway & Oxygen Therapy: Patient Spontanous Breathing  Post-op Assessment: Report given to RN and Post -op Vital signs reviewed and stable  Post vital signs: Reviewed and stable  Last Vitals:  Vitals Value Taken Time  BP    Temp    Pulse    Resp    SpO2      Last Pain:  Vitals:   02/28/19 1038  TempSrc: Temporal  PainSc: 8          Complications: No apparent anesthesia complications

## 2019-02-28 NOTE — CV Procedure (Signed)
    Transesophageal Echocardiogram Note  Franklin Anderson [id number] Mar 15, 1971  Procedure: Transesophageal Echocardiogram Indications: bacteremia   Procedure Details Consent: Obtained Time Out: Verified patient identification, verified procedure, site/side was marked, verified correct patient position, special equipment/implants available, Radiology Safety Procedures followed,  medications/allergies/relevent history reviewed, required imaging and test results available.  Performed  Medications:  During this procedure the patient is administered a  Propofol drip - total of 125 mg with Lidocaine 100 mg IV .  Left Ventrical:  Normal LV systolic function   Mitral Valve: normal valve, no vegetations,  Trace - mild MR   Aortic Valve: normal , 3 leaflet valve   Tricuspid Valve: normal ,  Trivial TR   Pulmonic Valve: normal , no vegetation  Right Ventrical :  There is a mobile mass in the mid/ apical  RV , appears to be attached to a trabeculation.  The mass is mobile and moderate in size.   Possible etiologies include bacterial endocarditis, fibroelastoma.  Left Atrium/ Left atrial appendage: no thrombi   Atrial septum: no ASD or PFO by color doppler   Aorta:  Normal   Pericardium:   Small - moderate pericardial effusion.  No evidence of pericardial tamponade.   Complications: No apparent complications Patient did tolerate procedure well.   Thayer Headings, Brooke Anderson., MD, [facility] 02/28/2019, 12:09 PM

## 2019-02-28 NOTE — Progress Notes (Signed)
Spoke to pt's friend at his request and updated friend on the findings of tonight and the treatment plan.  KJKG, NP [facility]

## 2019-02-28 NOTE — Anesthesia Procedure Notes (Signed)
Date/Time: 02/28/2019 11:05 AM Performed by: Trinna Post., CRNA Pre-anesthesia Checklist: Patient identified, Emergency Drugs available, Suction available, Patient being monitored and Timeout performed Patient Re-evaluated:Patient Re-evaluated prior to induction Oxygen Delivery Method: Nasal cannula Preoxygenation: Pre-oxygenation with 100% oxygen Induction Type: IV induction Placement Confirmation: positive ETCO2

## 2019-03-01 ENCOUNTER — Inpatient Hospital Stay (HOSPITAL_COMMUNITY): Payer: Self-pay

## 2019-03-01 DIAGNOSIS — I313 Pericardial effusion (noninflammatory): Secondary | ICD-10-CM

## 2019-03-01 DIAGNOSIS — J90 Pleural effusion, not elsewhere classified: Secondary | ICD-10-CM

## 2019-03-01 DIAGNOSIS — I2699 Other pulmonary embolism without acute cor pulmonale: Secondary | ICD-10-CM

## 2019-03-01 DIAGNOSIS — J948 Other specified pleural conditions: Secondary | ICD-10-CM

## 2019-03-01 LAB — GLUCOSE, CAPILLARY
Glucose-Capillary: 124 mg/dL — ABNORMAL HIGH (ref 70–99)
Glucose-Capillary: 92 mg/dL (ref 70–99)
Glucose-Capillary: 96 mg/dL (ref 70–99)
Glucose-Capillary: 87 mg/dL (ref 70–99)
Glucose-Capillary: 144 mg/dL — ABNORMAL HIGH (ref 70–99)

## 2019-03-01 LAB — TYPE AND SCREEN
ABO/RH(D): A POS
Antibody Screen: NEGATIVE
Unit division: 0

## 2019-03-01 LAB — CBC
HCT: 26.5 % — ABNORMAL LOW (ref 39.0–52.0)
Hemoglobin: 8.1 g/dL — ABNORMAL LOW (ref 13.0–17.0)
MCH: 27.8 pg (ref 26.0–34.0)
MCHC: 30.6 g/dL (ref 30.0–36.0)
MCV: 91.1 fL (ref 80.0–100.0)
Platelets: 189 10*3/uL (ref 150–400)
RBC: 2.91 MIL/uL — ABNORMAL LOW (ref 4.22–5.81)
RDW: 15.7 % — ABNORMAL HIGH (ref 11.5–15.5)
WBC: 10.3 10*3/uL (ref 4.0–10.5)
nRBC: 0 % (ref 0.0–0.2)

## 2019-03-01 LAB — PROTIME-INR
INR: 1.4 — ABNORMAL HIGH (ref 0.8–1.2)
Prothrombin Time: 16.7 seconds — ABNORMAL HIGH (ref 11.4–15.2)

## 2019-03-01 LAB — BPAM RBC
Blood Product Expiration Date: 202011272359
ISSUE DATE / TIME: 202011031341
Unit Type and Rh: 6200

## 2019-03-01 LAB — BASIC METABOLIC PANEL
Anion gap: 7 (ref 5–15)
BUN: 30 mg/dL — ABNORMAL HIGH (ref 6–20)
CO2: 26 mmol/L (ref 22–32)
Calcium: 7.7 mg/dL — ABNORMAL LOW (ref 8.9–10.3)
Chloride: 107 mmol/L (ref 98–111)
Creatinine, Ser: 0.48 mg/dL — ABNORMAL LOW (ref 0.61–1.24)
GFR calc Af Amer: >60 mL/min (ref >60)
GFR calc non Af Amer: >60 mL/min (ref >60)
Glucose, Bld: 131 mg/dL — ABNORMAL HIGH (ref 70–99)
Potassium: 3.9 mmol/L (ref 3.5–5.1)
Sodium: 140 mmol/L (ref 135–145)

## 2019-03-01 LAB — HEPATITIS A ANTIBODY, TOTAL: hep A Total Ab: NONREACTIVE

## 2019-03-01 LAB — CULTURE, BLOOD (ROUTINE X 2)
Culture: NO GROWTH
Special Requests: ADEQUATE

## 2019-03-01 LAB — ECHOCARDIOGRAM LIMITED
Height: 72 in
Weight: 1901.25 oz

## 2019-03-01 LAB — HEPATITIS B SURFACE ANTIGEN: Hepatitis B Surface Ag: NONREACTIVE

## 2019-03-01 LAB — APTT: aPTT: 30 seconds (ref 24–36)

## 2019-03-01 LAB — HEPATITIS B SURFACE ANTIBODY,QUALITATIVE: Hep B S Ab: NONREACTIVE

## 2019-03-01 LAB — HEPARIN LEVEL (UNFRACTIONATED): Heparin Unfractionated: 0.19 IU/mL — ABNORMAL LOW (ref 0.30–0.70)

## 2019-03-01 IMAGING — DX DG CHEST 1V PORT
1 series · 1 of 1 positions shown · non-contrast
Comparison: CT chest [DATE], plain film [DATE]

CLINICAL DATA: 47-year-old male with a history of chest tube
placement

EXAM:
PORTABLE CHEST 1 VIEW

[chest ap]
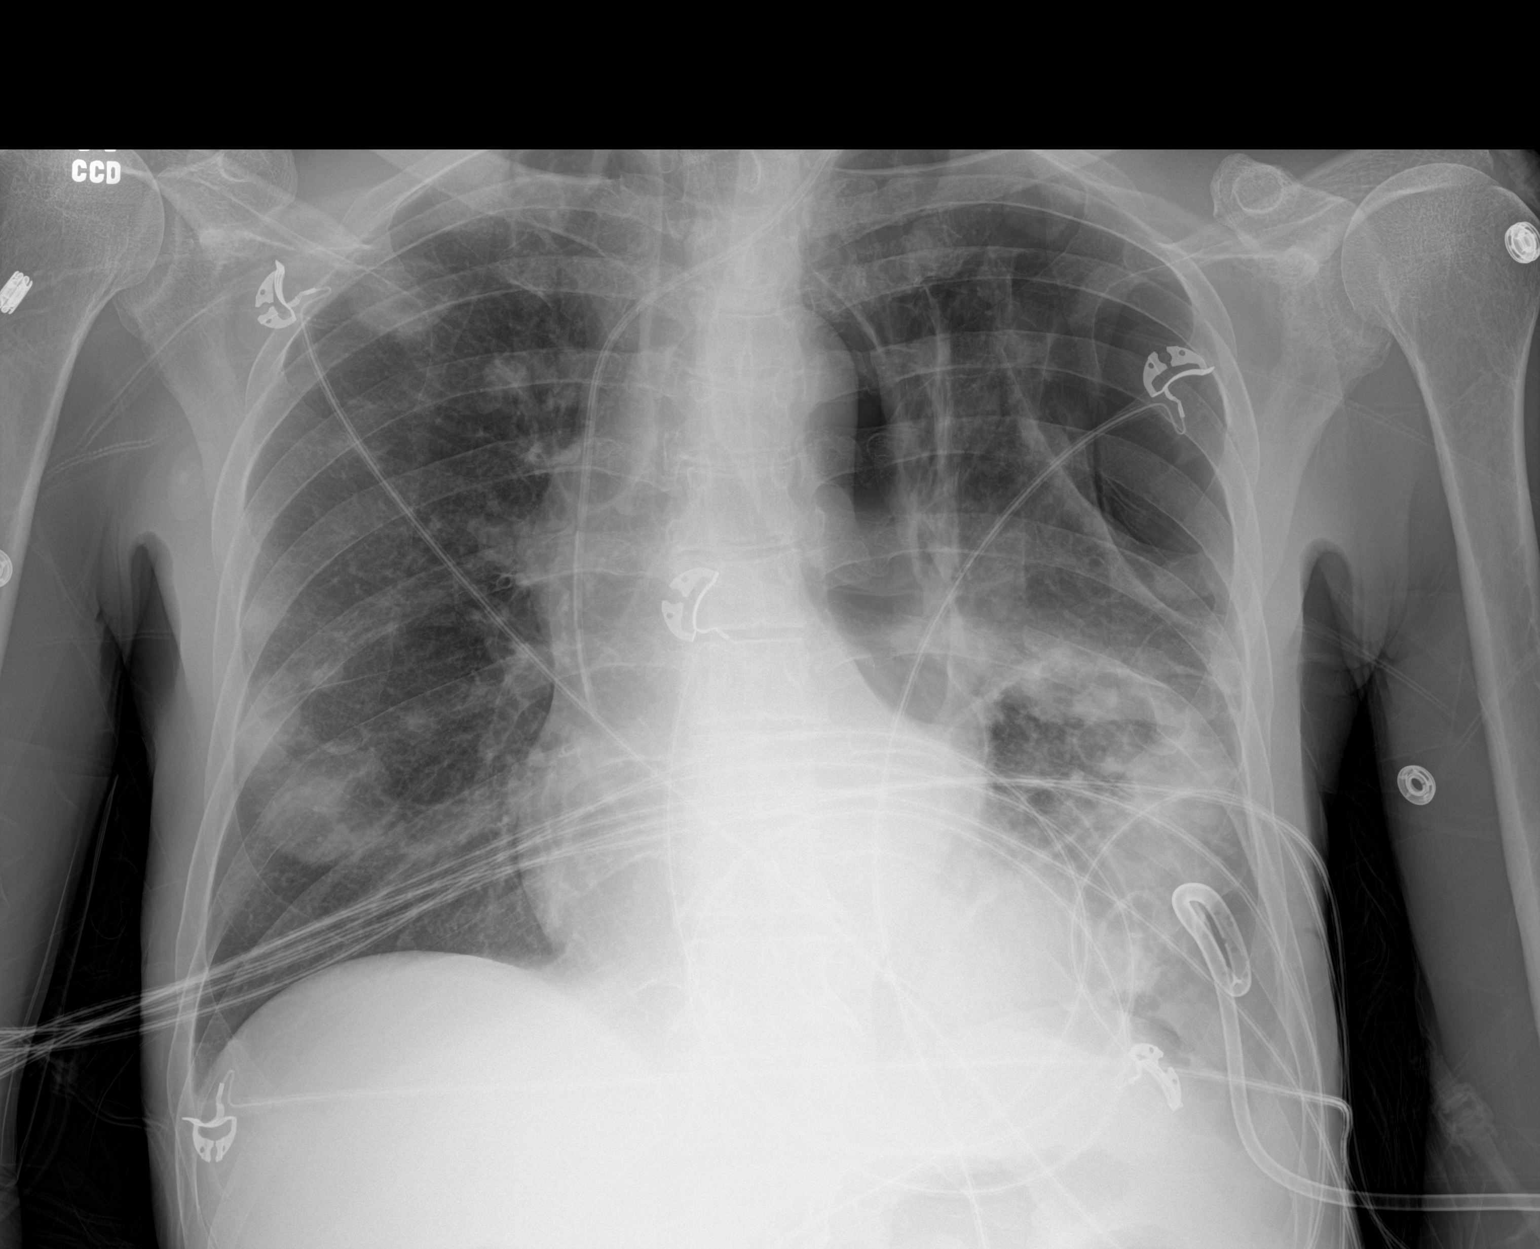

[1 of 1 positions shown; findings below may reference images not displayed]

FINDINGS: Cardiomediastinal silhouette is unchanged in size and contour.

Unchanged left IJ central venous catheter with the tip appearing to
terminate superior cavoatrial junction.

Interval placement thoracostomy tube the left lung base. Components
of pneumothorax in the upper lung persist with incomplete
re-expansion of the lung.

Opacity within the left mid and lower lung persists obscuring the
left hemidiaphragm in the left heart border.

Nodular opacities of the right lung persist. No right pneumothorax
or right pleural effusion.
IMPRESSION: Interval placement of left-sided thoracostomy tube at the lung base
with incomplete re-expansion of the left lung and persisting
pneumothorax/hydropneumothorax.

Similar appearance of the lungs with multifocal septic emboli and
consolidation/scarring at the left lung base.

Unchanged left IJ central venous catheter.

## 2019-03-01 MED ORDER — HYDROMORPHONE HCL 2 MG/ML IJ SOLN
1.5000 mg | INTRAMUSCULAR | Status: DC | PRN
  Administered 2019-03-01 – 2019-03-08 (×65): 1.5 mg via INTRAVENOUS
  Administered 2019-03-08: 1 mg via INTRAVENOUS
  Administered 2019-03-08 – 2019-03-14 (×50): 1.5 mg via INTRAVENOUS
  Filled 2019-03-01 (×119): qty 1

## 2019-03-01 MED ORDER — MIDAZOLAM HCL 2 MG/2ML IJ SOLN
INTRAMUSCULAR | Status: AC
  Administered 2019-03-01: 2 mg via INTRAVENOUS
  Filled 2019-03-01: qty 2

## 2019-03-01 MED ORDER — MIDAZOLAM HCL 2 MG/2ML IJ SOLN
2.0000 mg | Freq: Once | INTRAMUSCULAR | Status: AC
  Administered 2019-03-01: 12:00:00 2 mg via INTRAVENOUS

## 2019-03-01 MED ORDER — INSULIN ASPART 100 UNIT/ML ~~LOC~~ SOLN
0.0000 [IU] | Freq: Three times a day (TID) | SUBCUTANEOUS | Status: DC
  Administered 2019-03-01 – 2019-03-03 (×3): 2 [IU] via SUBCUTANEOUS
  Administered 2019-03-03: 4 [IU] via SUBCUTANEOUS
  Administered 2019-03-03 – 2019-03-04 (×2): 2 [IU] via SUBCUTANEOUS
  Administered 2019-03-04: 22:00:00 4 [IU] via SUBCUTANEOUS
  Administered 2019-03-04 – 2019-03-05 (×2): 2 [IU] via SUBCUTANEOUS
  Administered 2019-03-05 – 2019-03-06 (×2): 4 [IU] via SUBCUTANEOUS
  Administered 2019-03-06 (×2): 2 [IU] via SUBCUTANEOUS
  Administered 2019-03-07: 12:00:00 4 [IU] via SUBCUTANEOUS
  Administered 2019-03-07 – 2019-03-09 (×4): 2 [IU] via SUBCUTANEOUS
  Administered 2019-03-09: 4 [IU] via SUBCUTANEOUS
  Administered 2019-03-09 – 2019-03-11 (×4): 2 [IU] via SUBCUTANEOUS
  Administered 2019-03-12: 8 [IU] via SUBCUTANEOUS
  Administered 2019-03-12 – 2019-03-14 (×5): 2 [IU] via SUBCUTANEOUS
  Administered 2019-03-15: 8 [IU] via SUBCUTANEOUS
  Administered 2019-03-16: 21:00:00 2 [IU] via SUBCUTANEOUS
  Administered 2019-03-16: 4 [IU] via SUBCUTANEOUS
  Administered 2019-03-16 – 2019-03-19 (×5): 2 [IU] via SUBCUTANEOUS
  Administered 2019-03-19: 8 [IU] via SUBCUTANEOUS
  Administered 2019-03-20 – 2019-03-26 (×11): 2 [IU] via SUBCUTANEOUS
  Administered 2019-03-26: 12 [IU] via SUBCUTANEOUS
  Administered 2019-03-27: 09:00:00 2 [IU] via SUBCUTANEOUS

## 2019-03-01 MED ORDER — KETOROLAC TROMETHAMINE 15 MG/ML IJ SOLN
15.0000 mg | Freq: Three times a day (TID) | INTRAMUSCULAR | Status: DC
  Administered 2019-03-01 – 2019-03-03 (×6): 15 mg via INTRAVENOUS
  Filled 2019-03-01 (×6): qty 1

## 2019-03-01 NOTE — Progress Notes (Signed)
  Echocardiogram 2D Echocardiogram limited has been performed.  Darlina Sicilian M 03/01/2019, 10:44 AM

## 2019-03-01 NOTE — Progress Notes (Addendum)
Progress Note  Patient Name: Franklin Anderson Date of Encounter: 03/01/2019  Primary Cardiologist: Rollene Rotunda, MD   Subjective   Resting no complaints.  Inpatient Medications    Scheduled Meds:  amLODipine  10 mg Oral Daily   Chlorhexidine Gluconate Cloth  6 each Topical Daily   feeding supplement (ENSURE ENLIVE)  237 mL Oral BID BM   insulin aspart  0-24 Units Subcutaneous Q4H   ipratropium  0.5 mg Nebulization BID   levalbuterol  1.25 mg Nebulization BID   mouth rinse  15 mL Mouth Rinse BID   multivitamin with minerals  1 tablet Oral Daily   pantoprazole  40 mg Oral Daily   sodium chloride (PF)       sodium chloride flush  10-40 mL Intracatheter Q12H   Continuous Infusions:   ceFAZolin (ANCEF) IV Stopped (03/01/19 0531)   heparin 1,250 Units/hr (03/01/19 0538)   sodium chloride     PRN Meds: acetaminophen **OR** acetaminophen, acetaminophen, clonazePAM, hydrALAZINE, HYDROmorphone (DILAUDID) injection, ipratropium-albuterol, ondansetron **OR** ondansetron (ZOFRAN) IV, oxyCODONE-acetaminophen, senna-docusate, sodium chloride flush   Vital Signs    Vitals:   03/01/19 0000 03/01/19 0400 03/01/19 0428 03/01/19 0747  BP: (!) 155/110     Pulse: 100     Resp: 19     Temp: 98.4 F (36.9 C) 97.9 F (36.6 C)    TempSrc: Axillary Axillary    SpO2: 96%   96%  Weight:   53.9 kg   Height:        Intake/Output Summary (Last 24 hours) at 03/01/2019 0805 Last data filed at 03/01/2019 0538 Gross per 24 hour  Intake 1500.83 ml  Output 625 ml  Net 875.83 ml   Last 3 Weights 03/01/2019 02/28/2019 02/28/2019  Weight (lbs) 118 lb 13.3 oz 139 lb 1.8 oz 139 lb 1.8 oz  Weight (kg) 53.9 kg 63.1 kg 63.1 kg      Telemetry    SR to ST - Personally Reviewed  ECG    SR no acute changes, normal. - Personally Reviewed  Physical Exam  Per Dr. Flora Lipps    Vitals:   03/01/19 0747 03/01/19 0800 03/01/19 0933 03/01/19 1200  BP:   (!) 168/113   Pulse:      Resp:        Temp:  97.7 F (36.5 C)  (!) 97.4 F (36.3 C)  TempSrc:  Oral    SpO2: 96%     Weight:      Height:         Intake/Output Summary (Last 24 hours) at 03/01/2019 1737 Last data filed at 03/01/2019 1536 Gross per 24 hour  Intake 423.17 ml  Output 1075 ml  Net -651.83 ml    Last 3 Weights 03/01/2019 02/28/2019 02/28/2019  Weight (lbs) 118 lb 13.3 oz 139 lb 1.8 oz 139 lb 1.8 oz  Weight (kg) 53.9 kg 63.1 kg 63.1 kg    Body mass index is 16.12 kg/m.  General: Ill-appearing, cachexia noted, tachypnea noted Head: Atraumatic, normal size  Eyes: PEERLA, EOMI  Neck: Supple, no JVD Endocrine: No thryomegaly Cardiac: Tachycardia noted, no murmurs rubs or gallops Lungs: Diminished breath sounds bilaterally Abd: Soft, nontender, no hepatomegaly  Ext: Trace lower extreme edema Musculoskeletal: No deformities Skin: Warm and dry, no rashes   Neuro: Alert and oriented to person, place, time, and situation, CNII-XII grossly intact, no focal deficits  Psych: Normal mood and affect    Labs    High Sensitivity Troponin:  No results  for input(s): TROPONINIHS in the last 720 hours.    Chemistry Recent Labs  Lab 02/23/19 0802 02/24/19 0415 02/25/19 0440  02/27/19 0538 02/28/19 0432 03/01/19 0418  NA 135 141 148*   < > 141 138 140  K 3.8 3.4* 4.0   < > 4.0 4.3 3.9  CL 103 108 113*   < > 109 107 107  CO2 < > GLUCOSE 130* 158* 138*   < > 107* 104* 131*  BUN 102* 88* 64*   < > 48* 34* 30*  CREATININE 1.25* 0.80 0.61   < > 0.53* 0.43* 0.48*  CALCIUM 7.8* 7.8* 8.1*   < > 8.1* 7.9* 7.7*  PROT 5.9* 6.2* 6.4*  --   --   --   --   ALBUMIN 1.5* 1.5* 1.4*  --   --   --   --   AST 39 26 23  --   --   --   --   ALT 47* 39 28  --   --   --   --   ALKPHOS 93 88 81  --   --   --   --   BILITOT 0.9 0.8 0.6  --   --   --   --   GFRNONAA >60 >60 >60   < > >60 >60 >60  GFRAA >60 >60 >60   < > >60 >60 >60  ANIONGAP < > < > = values in this interval not displayed.      Hematology Recent Labs  Lab 02/27/19 0538 02/28/19 0432 02/28/19 0910 02/28/19 2023 03/01/19 0418  WBC 12.3* 9.4  --   --  10.3  RBC 2.75* 2.53*  --   --  2.91*  HGB 7.8* 7.1* 7.9* 9.4* 8.1*  HCT 25.3* 23.4* 25.6* 29.6* 26.5*  MCV 92.0 92.5  --   --  91.1  MCH 28.4 28.1  --   --  27.8  MCHC 30.8 30.3  --   --  30.6  RDW 16.0* 15.9*  --   --  15.7*  PLT 252 182  --   --  189    BNP Recent Labs  Lab 02/23/19 0802  BNP 193.3*     DDimer  Recent Labs  Lab 02/22/19 1757 02/22/19 1850  DDIMER BLOOD/ANTICOAG RATIO IN TUBE UNSATISFACTORY 8.03*     Radiology    Ct Angio Chest Pe W Or Wo Contrast  Addendum Date: 02/28/2019   ADDENDUM REPORT: 02/28/2019 22:19 ADDENDUM: Minimal mediastinal shift. Results and addendum were called by telephone at the time of interpretation on 02/28/2019 at 10:19 pm to provider NP Craige Cotta , who verbally acknowledged these results. Electronically Signed   By: Kreg Shropshire M.D.   On: 02/28/2019 22:19   Result Date: 02/28/2019 CLINICAL DATA:  Increasing shortness of breath post transesophageal ECHO EXAM: CT ANGIOGRAPHY CHEST WITH CONTRAST TECHNIQUE: Multidetector CT imaging of the chest was performed using the standard protocol during bolus administration of intravenous contrast. Multiplanar CT image reconstructions and MIPs were obtained to evaluate the vascular anatomy. CONTRAST:  OMNIPAQUE IOHEXOL 350 MG/ML SOLN COMPARISON:  Same-day radiograph FINDINGS: Cardiovascular: Satisfactory opacification the pulmonary arteries to the segmental level. Segmental and subsegmental pulmonary artery emboli are noted throughout the left lower lobe, lingula and with the lobar, segmental and subsegmental arteries of the right upper lobe. Suspect more extensive embolic burden however  given the numerous cavitary lesions throughout the lungs compatible with septic emboli. Flattening of the intraventricular septum and mild elevation of the RV LV ratio (0.95) concerning  for right heart strain. Cardiac size remains within normal limits. Few coronary artery calcifications are present. No sizable pericardial effusion though difficult to discern given increased attenuation of the mediastinal fat. Mediastinum/Nodes: Diffusely increased attenuation of the mediastinal fat suggesting soft tissue edema. Prominent mediastinal and hilar adenopathy, likely reactive. No acute abnormality of the trachea or esophagus. Thyroid gland and thoracic inlet are unremarkable. Lungs/Pleura: There are numerous rounded and cavitary fluid-filled lesions throughout the lungs suspicious for septic pulmonary emboli. Some interspersed regions of ground-glass attenuation and tree-in-bud nodularity noted in the left lung base. There is irregular visceral pleural thickening involving the left lung with a large left hydropneumothorax with dependently layering simple attenuation fluid. Small right pleural effusion present as well with adjacent passive atelectasis. Upper Abdomen: Nodular hepatic surface contour. Moderate volume upper abdominal ascites. Musculoskeletal: Nonunited subacute to remote lateral left tenth rib fracture.No acute osseous abnormality or suspicious osseous lesion. Paucity of subcutaneous fat. Body wall edema. Review of the MIP images confirms the above findings. IMPRESSION: 1. Features of widespread septic emboli with more central pulmonary arterial filling defects seen within the left lower lobe, positive for acute PE with CT evidence of right heart strain (RV/LV Ratio = 0.95) consistent with at least submassive (intermediate risk) PE. The presence of right heart strain has been associated with an increased risk of morbidity and mortality. Please activate Code PE by paging [phone]. 2. No visible pneumomediastinum. 3. Irregular visceral pleural thickening involving the left lung with a large left hydropneumothorax, concerning for empyema. 4. Small right pleural effusion and adjacent passive  atelectasis. 5. Nodular hepatic surface contour, consistent with cirrhosis. Moderate volume upper abdominal ascites. 6. Additional features of anasarca diffusely increased attenuation of the mediastinal fat and circumferential body wall edema. 7. Aortic Atherosclerosis (ICD10-I70.0). Currently awaiting provider callback. Addendum will be submitted following contact with the ordering provider or care team. Electronically Signed: By: Kreg Shropshire M.D. On: 02/28/2019 22:11   Dg Chest Port 1 View  Addendum Date: 02/28/2019   ADDENDUM REPORT: 02/28/2019 19:27 ADDENDUM: Critical Value/emergent results were called by telephone at the time of interpretation on 02/28/2019 at 7:27 pm to providerKaren Craige Cotta, who verbally acknowledged these results. Electronically Signed   By: Donzetta Kohut M.D.   On: 02/28/2019 19:27   Result Date: 02/28/2019 CLINICAL DATA:  Elevated heart rate and dyspnea. EXAM: PORTABLE CHEST 1 VIEW COMPARISON:  02/24/2019 FINDINGS: New loculated left pneumothorax in the setting of cavitary lung lesions. Moderately large pneumothorax and areas of collapse/consolidation in the left lung base also with sub pulmonic lucency and deep sulcus sign. Concomitant left pleural effusion is suspected. Cavitary lesions in the right chest are unchanged. Mild rotation with probable slight mediastinal shift. Post extubation with left IJ central venous catheter terminating at the caval to atrial junction as before. No acute bone process. IMPRESSION: 1. New loculated left pneumothorax in the setting of cavitary lung lesions. Mediastinal shift while slight is present on the current study. 2. Stable cavitary lesions in the right chest. 3. Post extubation. 4. A call is out to the referring provider to further discuss above findings. Electronically Signed: By: Donzetta Kohut M.D. On: 02/28/2019 19:19    Cardiac Studies   TEE 02/28/19  Procedure Details During this procedure the patient is administered a  Propofol  drip - total of 125 mg  with Lidocaine 100 mg IV .  Left Ventrical:  Normal LV systolic function   Mitral Valve: normal valve, no vegetations,  Trace - mild MR   Aortic Valve: normal , 3 leaflet valve   Tricuspid Valve: normal ,  Trivial TR   Pulmonic Valve: normal , no vegetation  Right Ventrical :  There is a mobile mass in the mid/ apical  RV , appears to be attached to a trabeculation.  The mass is mobile and moderate in size.   Possible etiologies include bacterial endocarditis, fibroelastoma.  Left Atrium/ Left atrial appendage: no thrombi   Atrial septum: no ASD or PFO by color doppler   Aorta:  Normal   Pericardium:   Small - moderate pericardial effusion.  No evidence of pericardial tamponade.   Complications: No apparent complications Patient did tolerate procedure well.  TT ECHO 02/22/19 IMPRESSIONS   1. Left ventricular ejection fraction, by visual estimation, is 60 to 65%. The left ventricle has normal function. There is no left ventricular hypertrophy.  2. Left ventricular diastolic parameters are consistent with Grade I diastolic dysfunction (impaired relaxation).  3. Global right ventricle has normal systolic function.The right ventricular size is normal. No increase in right ventricular wall thickness.  4. Left atrial size was normal.  5. Right atrial size was normal.  6. Moderate pericardial effusion.  7. The pericardial effusion is localized near the right ventricle and localized near the right atrium.  8. Mild RV diastolic collapse. No mitral or tricuspid inflow variation.  9. Mild thickening of the anterior mitral valve leaflet(s). 10. The mitral valve is abnormal. Mild mitral valve regurgitation. No evidence of mitral stenosis. 11. Anterior mitral valve leaflet is mildly thickened and hypermobile. Mitral regurgitation is noted in the subcostal 4 chamber view that is concerning for leaflet perforation rather than at a site of malcoaptation. In  the setting of IVDA recommend TEE  to better evaluate. 12. The tricuspid valve is normal in structure. Tricuspid valve regurgitation is trivial. 13. The aortic valve is normal in structure. Aortic valve regurgitation is not visualized. No evidence of aortic valve sclerosis or stenosis. 14. The pulmonic valve was normal in structure. Pulmonic valve regurgitation is not visualized. 15. Normal pulmonary artery systolic pressure. 16. The inferior vena cava is normal in size with greater than 50% respiratory variability, suggesting right atrial pressure of 3 mmHg.  FINDINGS  Left Ventricle: Left ventricular ejection fraction, by visual estimation, is 60 to 65%. The left ventricle has normal function. There is no left ventricular hypertrophy. Left ventricular diastolic parameters are consistent with Grade I diastolic  dysfunction (impaired relaxation). Normal left atrial pressure.  Right Ventricle: The right ventricular size is normal. No increase in right ventricular wall thickness. Global RV systolic function is has normal systolic function. The tricuspid regurgitant velocity is 1.59 m/s, and with an assumed right atrial pressure  of 3 mmHg, the estimated right ventricular systolic pressure is normal at 13.1 mmHg.  Left Atrium: Left atrial size was normal in size.  Right Atrium: Right atrial size was normal in size  Pericardium: A moderately sized pericardial effusion is present. The pericardial effusion is localized near the right ventricle and localized near the right atrium. There is no evidence of cardiac tamponade. Mild RV diastolic collapse. No mitral or  tricuspid inflow variation.  Mitral Valve: The mitral valve is abnormal. There is mild thickening of the anterior mitral valve leaflet(s). No evidence of mitral valve stenosis by observation. Mild mitral valve regurgitation. Anterior  mitral valve leaflet is mildly thickened and  hypermobile. Mitral regurgitation is noted in the  subcostal 4 chamber view that is concerning for leaflet perforation rather than at a site of malcoaptation. In the setting of IVDA recommend TEE to better evaluate.  Tricuspid Valve: The tricuspid valve is normal in structure. Tricuspid valve regurgitation is trivial.  Aortic Valve: The aortic valve is normal in structure. Aortic valve regurgitation is not visualized. The aortic valve is structurally normal, with no evidence of sclerosis or stenosis.  Pulmonic Valve: The pulmonic valve was normal in structure. Pulmonic valve regurgitation is not visualized.  Aorta: The aortic root, ascending aorta and aortic arch are all structurally normal, with no evidence of dilitation or obstruction.  Venous: The inferior vena cava is normal in size with greater than 50% respiratory variability, suggesting right atrial pressure of 3 mmHg.  IAS/Shunts: No atrial level shunt detected by color flow Doppler. No ventricular septal defect is seen or detected. There is no evidence of an atrial septal defect.    Patient Profile     48 y.o. male with a PMH of IVDA, HTN, tobacco abuse, and rheumatic fever,who is being followed by cardiology for the evaluation ofpericardial effusion and possible mitral valve vegetation on admit with osteomyelitis/disckitis of L1, L4, and L5, septic pulmonary emboli, and abscess of psoas muscle (not amenable to drainage per IR) in the setting of heroine abuse.  Assessment & Plan    Sepsis in the setting of IVDA: patient presented with back pain/LE weakness, found to have osteomyelitis/discitis of L1/L4/L5, psoas muscle abscess, and septic emboli. ID following - c/f infective endocarditis. Patient is on broad spectrum antibiotics.  Pericardial effusion:moderate on echo 02/22/2019. Mild to Moderate on TEE. No tamponade clinically, has been hypertensive  HTN:BP is up this morning,168/113     For questions or updates, please contact [facility] Please consult  [email or web address] for contact info under   Signed, Cecilie Kicks, NP  03/01/2019, 8:05 AM     Attending attestation:   Patient seen and examined, note reviewed with the signed Advanced Practice Provider. I personally reviewed laboratory data, imaging studies and relevant notes. I independently examined the patient and formulated the important aspects of the plan. I have personally discussed the plan with the patient and/or family. Comments or changes to the note/plan are indicated below.  48 year old male with significant history of IV drug abuse who was admitted for sepsis secondary to MSSA bacteremia that is been complicated by septic embolism to the spine, psoas muscle, and lungs.  He underwent transesophageal echocardiogram which demonstrated a likely mural vegetation in the right ventricular outflow tract.  There was no evidence of valvular involvement of his endocarditis.    Endocarditis, right ventricular mural vegetation  Overall, this is concerning for endocarditis.  We will plan to repeat a transthoracic echocardiogram today to determine if this mural vegetation is still in the RV.  I highly suspect given his decompensation overnight that has been complicated by pneumothorax that this vegetation has embolized to the lungs.  I would advise against anticoagulation in the setting of septic embolism as bleeding which become a major issue for him.  Given no definitive valvular involvement on his transesophageal echocardiogram, I would not recommend surgery at this time.  We will just have to follow him clinically and see how he does with medical therapy.  His pericardial effusion is moderate and there is no evidence of cardiac tamponade clinically or on his imaging study.  No issues with  this.  Signed, Lenna GilfordWesley T. Flora Lipps'Neal, MD [facility]   [facility]  03/01/2019 10:27 AM

## 2019-03-01 NOTE — Progress Notes (Addendum)
PROGRESS NOTE    Franklin Anderson  JAS:[id number]  DOB: August 24, 1970  DOA: 02/21/2019 PCP: Mack Hook, MD  Brief Narrative:  48 y.o. male with medical history significant of rheumatic fever, HTN, ongoing IV heroin abuse, presented to the ED on 10/27 with 3 week course of progressively worsening back pain and BLE weakness associated with some bowel incontinence and trouble walking. Last use of heroin was on the day of admission. ED Course: Found to have:sepsis with Tm 100, HR 126, RR 31, WBC 22.4k,AKI with BUN 111 and creat 1.7. Imaging studies revealed osteomyelitis and diskitis of L4-L5,septic pulmonary emboli and B psoas abscesses. Patient recieved 2L NS bolus, put on cefepime / vanc / flagyl initially, admitted to [facility] for Mx of sepsis and associated metabolic encephalopathy.2x Blood culture 10/27>> MSSA. Patient became more obtunded and dyspneic on 10/28 , PCCM consulted-intubated --extubated on 10/31. Patient underwent R psoas abscess aspiration on 10/28. Wound culture>> MSSA.  Respiratory culture from 10/28 also growing MSSA.  Echo revealed Grade 1 diastolic dysfunction, preserved EF and possible mitral valve vegetation. TEE recommended. CXR suggestive of septic emboli and MRI head showed abnormal dural enhancement. ID/Cardiology following. HC additionally complicated by hypernatremia, persistent uremia, pericardial effusion (did not look too bad on bedside US 10/29 per PCCM) and severe deconditioning. Abx course--Cefepime10/28 >> 10/29 ,Vanc  10/28 >> 10/30, Cefazolin 10/30>>now  Subjective:  CT findings and events overnight noted.  Patient started on heparin for pulmonary emboli reported on CT although could be septic emboli.  Patient surprisingly doing well this morning, not tachypneic at all and saturating well on room air. Diastolic BP still elevated. Mentating well.  CODE STATUS changed to DNR overnight.  Objective: Vitals:   03/01/19 0747 03/01/19 0800 03/01/19 0933 03/01/19  1200  BP:   (!) 168/113   Pulse:      Resp:      Temp:  97.7 F (36.5 C)  (!) 97.4 F (36.3 C)  TempSrc:  Oral    SpO2: 96%     Weight:      Height:        Intake/Output Summary (Last 24 hours) at 03/01/2019 1626 Last data filed at 03/01/2019 1536 Gross per 24 hour  Intake 738.17 ml  Output 1075 ml  Net -336.83 ml   Filed Weights   02/28/19 0500 02/28/19 1038 03/01/19 0428  Weight: 63.1 kg 63.1 kg 53.9 kg    Physical Examination:  General exam: Appears comfortable now and in no respiratory distress Respiratory system: Decreased breath sounds at bases, clear to auscultation. Respiratory effort normal. Cardiovascular system: S1 & S2 heard,tachycardic. No JVD, murmurs, rubs, gallops or clicks. No pedal edema. Gastrointestinal system: Abdomen is nondistended, soft and nontender. No organomegaly or masses felt. Normal bowel sounds heard. Central nervous system: Alert and oriented. No focal neurological deficits. Extremities: Symmetric 5 x 5 power. Skin: No rashes, lesions or ulcers Psychiatry: Judgement and insight appear normal. Mood & affect irritable due to n.p.o. status    Data Reviewed: I have personally reviewed following labs and imaging studies  CBC: Recent Labs  Lab 02/25/19 0440 02/26/19 0222 02/27/19 0538 02/28/19 0432 02/28/19 0910 02/28/19 2023 03/01/19 0418  WBC 13.4* 11.1* 12.3* 9.4  --   --  10.3  HGB 8.8* 7.5* 7.8* 7.1* 7.9* 9.4* 8.1*  HCT 28.4* 24.9* 25.3* 23.4* 25.6* 29.6* 26.5*  MCV 91.3 92.6 92.0 92.5  --   --  91.1  PLT 235 243 252 182  --   --  235   Basic Metabolic Panel: Recent Labs  Lab 02/23/19 0802 02/23/19 1700 02/24/19 0415 02/25/19 0440 02/26/19 0222 02/26/19 0707 02/27/19 0538 02/28/19 0432 03/01/19 0418  NA 135  --  141 148*  --  146* 141 138 140  K 3.8  --  3.4* 4.0  --  3.5 4.0 4.3 3.9  CL 103  --  108 113*  --  113* 109 107 107  CO2 24  --  26 27  --  _0 GLUCOSE 130*  --  158* 138*  --  130* 107* 104* 131*    BUN 102*  --  88* 64*  --  69* 48* 34* 30*  CREATININE 1.25*  --  0.80 0.61  --  0.55* 0.53* 0.43* 0.48*  CALCIUM 7.8*  --  7.8* 8.1*  --  8.1* 8.1* 7.9* 7.7*  MG 3.2* 3.1* 2.8* 2.5* 2.1  --  1.7  --   --   PHOS 6.6* 4.5 3.6  --   --   --   --   --   --    GFR: Estimated Creatinine Clearance: 86.1 mL/min (A) (by C-G formula based on SCr of 0.48 mg/dL (L)). Liver Function Tests: Recent Labs  Lab 02/22/19 1803 02/23/19 0802 02/24/19 0415 02/25/19 0440  AST 74* 39 26 23  ALT 65* 47* 39 28  ALKPHOS 121 93 88 81  BILITOT 1.7* 0.9 0.8 0.6  PROT 6.5 5.9* 6.2* 6.4*  ALBUMIN 1.7* 1.5* 1.5* 1.4*   No results for input(s): LIPASE, AMYLASE in the last 168 hours. No results for input(s): AMMONIA in the last 168 hours. Coagulation Profile: Recent Labs  Lab 02/22/19 1850 02/28/19 2331  INR 1.7* 1.4*   Cardiac Enzymes: No results for input(s): CKTOTAL, CKMB, CKMBINDEX, TROPONINI in the last 168 hours. BNP (last 3 results) No results for input(s): PROBNP in the last 8760 hours. HbA1C: Recent Labs    02/28/19 0910  HGBA1C 6.4*   CBG: Recent Labs  Lab 02/28/19 1951 02/28/19 2339 03/01/19 0330 03/01/19 0721 03/01/19 1132  GLUCAP 108* 105* 124* 92 96   Lipid Profile: No results for input(s): CHOL, HDL, LDLCALC, TRIG, CHOLHDL, LDLDIRECT in the last 72 hours. Thyroid Function Tests: No results for input(s): TSH, T4TOTAL, FREET4, T3FREE, THYROIDAB in the last 72 hours. Anemia Panel: Recent Labs    02/28/19 0910  FERRITIN 685*  TIBC 133*  IRON 50   Sepsis Labs: No results for input(s): PROCALCITON, LATICACIDVEN in the last 168 hours.  Recent Results (from the past 240 hour(s))  Blood Culture (routine x 2)     Status: Abnormal   Collection Time: 02/21/19  8:13 PM   Specimen: BLOOD LEFT FOREARM  Result Value Ref Range Status   Specimen Description   Final    BLOOD LEFT FOREARM Performed at [facility], [address] [place], [place] [address]    Special  Requests   Final    BOTTLES DRAWN AEROBIC AND ANAEROBIC Blood Culture results may not be optimal due to an inadequate volume of blood received in culture bottles Performed at [facility] [address] [place], [place] [address]    Culture  Setup Time   Final    GRAM POSITIVE COCCI IN BOTH AEROBIC AND ANAEROBIC BOTTLES CRITICAL RESULT CALLED TO, READ BACK BY AND VERIFIED WITH: PHARMD J LEGGE [id number] AT 6283 BY CM CRITICAL RESULT CALLED TO, READ BACK BY AND VERIFIED WITH: Kirkpatrick [id number]  AT 1357 BY CM Performed at [facility], [place] [address] [place], [place] [address]    Culture STAPHYLOCOCCUS AUREUS (A)  Final   Report Status 02/24/2019 FINAL  Final   Organism ID, Bacteria STAPHYLOCOCCUS AUREUS  Final      Susceptibility   Staphylococcus aureus - MIC*    CIPROFLOXACIN <=0.5 SENSITIVE Sensitive     ERYTHROMYCIN >=8 RESISTANT Resistant     GENTAMICIN <=0.5 SENSITIVE Sensitive     OXACILLIN 0.5 SENSITIVE Sensitive     TETRACYCLINE <=1 SENSITIVE Sensitive     VANCOMYCIN 1 SENSITIVE Sensitive     TRIMETH/SULFA <=10 SENSITIVE Sensitive     CLINDAMYCIN <=0.25 SENSITIVE Sensitive     RIFAMPIN <=0.5 SENSITIVE Sensitive     Inducible Clindamycin NEGATIVE Sensitive     * STAPHYLOCOCCUS AUREUS  SARS Coronavirus 2 by RT PCR (hospital order, performed in [facility] lab) Nasopharyngeal Nasopharyngeal Swab     Status: None   Collection Time: 02/21/19  8:16 PM   Specimen: Nasopharyngeal Swab  Result Value Ref Range Status   SARS Coronavirus 2 NEGATIVE NEGATIVE Final    Comment: (NOTE) If result is NEGATIVE SARS-CoV-2 target nucleic acids are NOT DETECTED. The SARS-CoV-2 RNA is generally detectable in upper and lower  respiratory specimens during the acute phase of infection. The lowest  concentration of SARS-CoV-2 viral copies this assay can detect is 250  copies / mL. A negative result does not preclude SARS-CoV-2 infection  and should not be used  as the sole basis for treatment or other  patient management decisions.  A negative result may occur with  improper specimen collection / handling, submission of specimen other  than nasopharyngeal swab, presence of viral mutation(s) within the  areas targeted by this assay, and inadequate number of viral copies  (<250 copies / mL). A negative result must be combined with clinical  observations, patient history, and epidemiological information. If result is POSITIVE SARS-CoV-2 target nucleic acids are DETECTED. The SARS-CoV-2 RNA is generally detectable in upper and lower  respiratory specimens dur ing the acute phase of infection.  Positive  results are indicative of active infection with SARS-CoV-2.  Clinical  correlation with patient history and other diagnostic information is  necessary to determine patient infection status.  Positive results do  not rule out bacterial infection or co-infection with other viruses. If result is PRESUMPTIVE POSTIVE SARS-CoV-2 nucleic acids MAY BE PRESENT.   A presumptive positive result was obtained on the submitted specimen  and confirmed on repeat testing.  While 2019 novel coronavirus  (SARS-CoV-2) nucleic acids may be present in the submitted sample  additional confirmatory testing may be necessary for epidemiological  and / or clinical management purposes  to differentiate between  SARS-CoV-2 and other Sarbecovirus currently known to infect humans.  If clinically indicated additional testing with an alternate test  methodology [phone]) is advised. The SARS-CoV-2 RNA is generally  detectable in upper and lower respiratory sp ecimens during the acute  phase of infection. The expected result is Negative. Fact Sheet for Patients:  StrictlyIdeas.no Fact Sheet for Healthcare Providers: BankingDealers.co.za This test is not yet approved or cleared by the [place] FDA and has been authorized for  detection and/or diagnosis of SARS-CoV-2 by FDA under an Emergency Use Authorization (EUA).  This EUA will remain in effect (meaning this test can be used) for the duration of the COVID-19 declaration under Section 564(b)(1) of the Act, 21 U.S.C. section 360bbb-3(b)(1), unless the authorization is  terminated or revoked sooner. Performed at [facility], [place] [address] [place], [place] [address]   MRSA PCR Screening     Status: None   Collection Time: 02/22/19  4:56 AM   Specimen: Nasal Mucosa; Nasopharyngeal  Result Value Ref Range Status   MRSA by PCR NEGATIVE NEGATIVE Final    Comment:        The GeneXpert MRSA Assay (FDA approved for NASAL specimens only), is one component of a comprehensive MRSA colonization surveillance program. It is not intended to diagnose MRSA infection nor to guide or monitor treatment for MRSA infections. Performed at [facility], [place] [address], [place], [place] [address]   Blood Culture (routine x 2)     Status: Abnormal   Collection Time: 02/22/19  5:33 AM   Specimen: BLOOD  Result Value Ref Range Status   Specimen Description   Final    BLOOD LEFT ANTECUBITAL Performed at [facility] [address] [place], [place] [address]    Special Requests   Final    BOTTLES DRAWN AEROBIC AND ANAEROBIC Blood Culture adequate volume Performed at [facility] [address], [place], [place] [address]    Culture  Setup Time   Final    GRAM POSITIVE COCCI ANAEROBIC BOTTLE ONLY CRITICAL RESULT CALLED TO, READ BACK BY AND VERIFIED WITH: PHARMD E JACKSON [id number] AT 1108 BY CM    Culture (A)  Final    STAPHYLOCOCCUS AUREUS SUSCEPTIBILITIES PERFORMED ON PREVIOUS CULTURE WITHIN THE LAST 5 DAYS. Performed at [facility], [place] [address] [place], [place] [address]    Report Status 02/24/2019 FINAL  Final  Aerobic/Anaerobic Culture (surgical/deep wound)     Status: None    Collection Time: 02/22/19 10:05 AM   Specimen: Abscess  Result Value Ref Range Status   Specimen Description   Final    ABSCESS RT PSOAS Performed at [facility] [address], [place], [place] [address]    Special Requests   Final    NONE Performed at [facility], [place] [address] [place], [place] [address]    Gram Stain   Final    RARE WBC PRESENT,BOTH PMN AND MONONUCLEAR FEW GRAM POSITIVE COCCI IN PAIRS    Culture   Final    MODERATE STAPHYLOCOCCUS AUREUS NO ANAEROBES ISOLATED Performed at [facility], [place] [address] [place], [place] [address]    Report Status 02/27/2019 FINAL  Final   Organism ID, Bacteria STAPHYLOCOCCUS AUREUS  Final      Susceptibility   Staphylococcus aureus - MIC*    CIPROFLOXACIN <=0.5 SENSITIVE Sensitive     ERYTHROMYCIN >=8 RESISTANT Resistant     GENTAMICIN <=0.5 SENSITIVE Sensitive     OXACILLIN 0.5 SENSITIVE Sensitive     TETRACYCLINE <=1 SENSITIVE Sensitive     VANCOMYCIN <=0.5 SENSITIVE Sensitive     TRIMETH/SULFA <=10 SENSITIVE Sensitive     CLINDAMYCIN <=0.25 SENSITIVE Sensitive     RIFAMPIN <=0.5 SENSITIVE Sensitive     Inducible Clindamycin NEGATIVE Sensitive     * MODERATE STAPHYLOCOCCUS AUREUS  Culture, respiratory (non-expectorated)     Status: None   Collection Time: 02/22/19  5:30 PM   Specimen: Bronchoalveolar Lavage; Respiratory  Result Value Ref Range Status   Specimen Description   Final    BRONCHIAL ALVEOLAR LAVAGE Performed at [facility] [address] [place], [place] [address]    Special Requests  Final    NONE Performed at [facility], [place] [address], [place], [place] [address]    Gram Stain   Final    ABUNDANT WBC PRESENT, PREDOMINANTLY PMN MODERATE GRAM POSITIVE COCCI Performed at [facility], [place] [address] [place], [place] [address]    Culture MODERATE STAPHYLOCOCCUS AUREUS  Final   Report Status 02/25/2019  FINAL  Final   Organism ID, Bacteria STAPHYLOCOCCUS AUREUS  Final      Susceptibility   Staphylococcus aureus - MIC*    CIPROFLOXACIN <=0.5 SENSITIVE Sensitive     ERYTHROMYCIN >=8 RESISTANT Resistant     GENTAMICIN <=0.5 SENSITIVE Sensitive     OXACILLIN 0.5 SENSITIVE Sensitive     TETRACYCLINE <=1 SENSITIVE Sensitive     VANCOMYCIN 1 SENSITIVE Sensitive     TRIMETH/SULFA <=10 SENSITIVE Sensitive     CLINDAMYCIN <=0.25 SENSITIVE Sensitive     RIFAMPIN <=0.5 SENSITIVE Sensitive     Inducible Clindamycin NEGATIVE Sensitive     * MODERATE STAPHYLOCOCCUS AUREUS  Culture, blood (Routine X 2) w Reflex to ID Panel     Status: None   Collection Time: 02/24/19 11:47 AM   Specimen: BLOOD LEFT HAND  Result Value Ref Range Status   Specimen Description   Final    BLOOD LEFT HAND Performed at [facility] [address], [place], [place] [address]    Special Requests   Final    BOTTLES DRAWN AEROBIC AND ANAEROBIC Blood Culture adequate volume Performed at [facility] [address] [place], [place] [address]    Culture   Final    NO GROWTH 5 DAYS Performed at [facility], [place] [address], [place], [place] [address]    Report Status 03/01/2019 FINAL  Final  Body fluid culture     Status: None (Preliminary result)   Collection Time: 03/01/19  1:30 PM   Specimen: Chest; Body Fluid  Result Value Ref Range Status   Specimen Description   Final    CHEST Performed at [facility] [address], [place], [place] [address]    Special Requests   Final    Normal Performed at [facility], [place] [address] [place], [place] [address]    Gram Stain   Final    MODERATE WBC PRESENT, PREDOMINANTLY PMN FEW GRAM POSITIVE COCCI IN CLUSTERS Performed at [facility], [place] [address], [place], [place] [address]    Culture PENDING  Incomplete   Report Status PENDING  Incomplete      Radiology Studies: Ct  Angio Chest Pe W Or Wo Contrast  Addendum Date: 02/28/2019   ADDENDUM REPORT: 02/28/2019 22:19 ADDENDUM: Minimal mediastinal shift. Results and addendum were called by telephone at the time of interpretation on 02/28/2019 at 10:19 pm to provider NP Baltazar Najjar , who verbally acknowledged these results. Electronically Signed   By: Lovena Le M.D.   On: 02/28/2019 22:19   Result Date: 02/28/2019 CLINICAL DATA:  Increasing shortness of breath post transesophageal ECHO EXAM: CT ANGIOGRAPHY CHEST WITH CONTRAST TECHNIQUE: Multidetector CT imaging of the chest was performed using the standard protocol during bolus administration of intravenous contrast. Multiplanar CT image reconstructions and MIPs were obtained to evaluate the vascular anatomy. CONTRAST:  173m OMNIPAQUE IOHEXOL 350 MG/ML SOLN COMPARISON:  Same-day radiograph FINDINGS: Cardiovascular: Satisfactory opacification the pulmonary arteries to the segmental level. Segmental and subsegmental pulmonary artery emboli are noted throughout the left lower lobe, lingula and  with the lobar, segmental and subsegmental arteries of the right upper lobe. Suspect more extensive embolic burden however given the numerous cavitary lesions throughout the lungs compatible with septic emboli. Flattening of the intraventricular septum and mild elevation of the RV LV ratio (0.95) concerning for right heart strain. Cardiac size remains within normal limits. Few coronary artery calcifications are present. No sizable pericardial effusion though difficult to discern given increased attenuation of the mediastinal fat. Mediastinum/Nodes: Diffusely increased attenuation of the mediastinal fat suggesting soft tissue edema. Prominent mediastinal and hilar adenopathy, likely reactive. No acute abnormality of the trachea or esophagus. Thyroid gland and thoracic inlet are unremarkable. Lungs/Pleura: There are numerous rounded and cavitary fluid-filled lesions throughout the lungs suspicious for  septic pulmonary emboli. Some interspersed regions of ground-glass attenuation and tree-in-bud nodularity noted in the left lung base. There is irregular visceral pleural thickening involving the left lung with a large left hydropneumothorax with dependently layering simple attenuation fluid. Small right pleural effusion present as well with adjacent passive atelectasis. Upper Abdomen: Nodular hepatic surface contour. Moderate volume upper abdominal ascites. Musculoskeletal: Nonunited subacute to remote lateral left tenth rib fracture.No acute osseous abnormality or suspicious osseous lesion. Paucity of subcutaneous fat. Body wall edema. Review of the MIP images confirms the above findings. IMPRESSION: 1. Features of widespread septic emboli with more central pulmonary arterial filling defects seen within the left lower lobe, positive for acute PE with CT evidence of right heart strain (RV/LV Ratio = 0.95) consistent with at least submassive (intermediate risk) PE. The presence of right heart strain has been associated with an increased risk of morbidity and mortality. Please activate Code PE by paging [phone]. 2. No visible pneumomediastinum. 3. Irregular visceral pleural thickening involving the left lung with a large left hydropneumothorax, concerning for empyema. 4. Small right pleural effusion and adjacent passive atelectasis. 5. Nodular hepatic surface contour, consistent with cirrhosis. Moderate volume upper abdominal ascites. 6. Additional features of anasarca diffusely increased attenuation of the mediastinal fat and circumferential body wall edema. 7. Aortic Atherosclerosis (ICD10-I70.0). Currently awaiting provider callback. Addendum will be submitted following contact with the ordering provider or care team. Electronically Signed: By: Lovena Le M.D. On: 02/28/2019 22:11   Dg Chest Port 1 View  Result Date: 03/01/2019 CLINICAL DATA:  48 year old male with a history of chest tube placement  EXAM: PORTABLE CHEST 1 VIEW COMPARISON:  CT chest 02/28/2019, plain film 02/28/2019 FINDINGS: Cardiomediastinal silhouette is unchanged in size and contour. Unchanged left IJ central venous catheter with the tip appearing to terminate superior cavoatrial junction. Interval placement thoracostomy tube the left lung base. Components of pneumothorax in the upper lung persist with incomplete re-expansion of the lung. Opacity within the left mid and lower lung persists obscuring the left hemidiaphragm in the left heart border. Nodular opacities of the right lung persist. No right pneumothorax or right pleural effusion. IMPRESSION: Interval placement of left-sided thoracostomy tube at the lung base with incomplete re-expansion of the left lung and persisting pneumothorax/hydropneumothorax. Similar appearance of the lungs with multifocal septic emboli and consolidation/scarring at the left lung base. Unchanged left IJ central venous catheter. Electronically Signed   By: Corrie Mckusick D.O.   On: 03/01/2019 12:43   Dg Chest Port 1 View  Addendum Date: 02/28/2019   ADDENDUM REPORT: 02/28/2019 19:27 ADDENDUM: Critical Value/emergent results were called by telephone at the time of interpretation on 02/28/2019 at 7:27 pm to providerKaren Baltazar Najjar, who verbally acknowledged these results. Electronically Signed   By: Zetta Bills  M.D.   On: 02/28/2019 19:27   Result Date: 02/28/2019 CLINICAL DATA:  Elevated heart rate and dyspnea. EXAM: PORTABLE CHEST 1 VIEW COMPARISON:  02/24/2019 FINDINGS: New loculated left pneumothorax in the setting of cavitary lung lesions. Moderately large pneumothorax and areas of collapse/consolidation in the left lung base also with sub pulmonic lucency and deep sulcus sign. Concomitant left pleural effusion is suspected. Cavitary lesions in the right chest are unchanged. Mild rotation with probable slight mediastinal shift. Post extubation with left IJ central venous catheter terminating at the  caval to atrial junction as before. No acute bone process. IMPRESSION: 1. New loculated left pneumothorax in the setting of cavitary lung lesions. Mediastinal shift while slight is present on the current study. 2. Stable cavitary lesions in the right chest. 3. Post extubation. 4. A call is out to the referring provider to further discuss above findings. Electronically Signed: By: Zetta Bills M.D. On: 02/28/2019 19:19   Vas [place] Lower Extremity Venous (dvt)  Result Date: 03/01/2019  Lower Venous Study Indications: Pulmonary embolism.  Risk Factors: None identified. Comparison Study: No prior studies. Performing Technologist: Oliver Hum RVT  Examination Guidelines: A complete evaluation includes B-mode imaging, spectral Doppler, color Doppler, and power Doppler as needed of all accessible portions of each vessel. Bilateral testing is considered an integral part of a complete examination. Limited examinations for reoccurring indications may be performed as noted. The reflux portion of the exam is performed with the patient in reverse Trendelenburg.  +---------+---------------+---------+-----------+----------+--------------+  RIGHT     Compressibility Phasicity Spontaneity Properties Thrombus Aging  +---------+---------------+---------+-----------+----------+--------------+  CFV       Full            Yes       Yes                                    +---------+---------------+---------+-----------+----------+--------------+  SFJ       Full                                                             +---------+---------------+---------+-----------+----------+--------------+  FV Prox   Full                                                             +---------+---------------+---------+-----------+----------+--------------+  FV Mid    Full                                                             +---------+---------------+---------+-----------+----------+--------------+  FV Distal Full                                                              +---------+---------------+---------+-----------+----------+--------------+  PFV       Full                                                             +---------+---------------+---------+-----------+----------+--------------+  POP       Full            Yes       Yes                                    +---------+---------------+---------+-----------+----------+--------------+  PTV       Full                                                             +---------+---------------+---------+-----------+----------+--------------+  PERO      Full                                                             +---------+---------------+---------+-----------+----------+--------------+   +---------+---------------+---------+-----------+----------+--------------+  LEFT      Compressibility Phasicity Spontaneity Properties Thrombus Aging  +---------+---------------+---------+-----------+----------+--------------+  CFV       Full            Yes       Yes                                    +---------+---------------+---------+-----------+----------+--------------+  SFJ       Full                                                             +---------+---------------+---------+-----------+----------+--------------+  FV Prox   Full                                                             +---------+---------------+---------+-----------+----------+--------------+  FV Mid    Full                                                             +---------+---------------+---------+-----------+----------+--------------+  FV Distal Full                                                             +---------+---------------+---------+-----------+----------+--------------+  PFV       Full                                                             +---------+---------------+---------+-----------+----------+--------------+  POP       Full            Yes       Yes                                     +---------+---------------+---------+-----------+----------+--------------+  PTV       Full                                                             +---------+---------------+---------+-----------+----------+--------------+  PERO      Full                                                             +---------+---------------+---------+-----------+----------+--------------+     Summary: Right: No evidence of common femoral vein obstruction. There is no evidence of deep vein thrombosis in the lower extremity. No cystic structure found in the popliteal fossa. Left: There is no evidence of deep vein thrombosis in the lower extremity. No cystic structure found in the popliteal fossa.  *See table(s) above for measurements and observations.    Preliminary         Scheduled Meds:  amLODipine  10 mg Oral Daily   Chlorhexidine Gluconate Cloth  6 each Topical Daily   feeding supplement (ENSURE ENLIVE)  237 mL Oral BID BM   insulin aspart  0-24 Units Subcutaneous TID AC & HS   ipratropium  0.5 mg Nebulization BID   ketorolac  15 mg Intravenous Q8H   levalbuterol  1.25 mg Nebulization BID   mouth rinse  15 mL Mouth Rinse BID   multivitamin with minerals  1 tablet Oral Daily   pantoprazole  40 mg Oral Daily   sodium chloride flush  10-40 mL Intracatheter Q12H   Continuous Infusions:   ceFAZolin (ANCEF) IV Stopped (03/01/19 1352)   sodium chloride      Assessment & Plan:    1. Acute hypoxic respiratory failure/ respiratory distress on 11/3 with CT findings of Empyema: Patient had AMS, dyspnea, increased secretions on presentation needing intubation-subsequently extubated and had been saturating well on 3 L nasal cannula.Respiratory cultures grew MSSA.Chest x-ray reported multiple pulmonary cavities/septic emboli with left lower lobe pneumonia and small pleural effusion. Patient underwent TEE on 11/3 at [facility], noted to be tachypneic few hours after returning but saturating 99% on 3  lits. Stat CXR was obtained and after d/w cardiology CT chest was also ordered. CXR showed loculated pneumothorax with mediastinal shift/rotation. PCCM was re-consulted--CT chest later reported "Features of widespread septic emboli with more central pulmonary arterial filling defects seen within the left lower lobe, positive  for acute PE with CT evidence of right heart strain.No visible pneumomediastinum. Irregular visceral pleural thickening involving the left lung with a large left hydropneumothorax, concerning for empyema". PCCM recommended IR assisted drainage--IR recommended CT surgery eval --this morning patient underwent chest tube/ drainage of empyema by PCCM--sent for cultures. Seen by ID who recommended to continue current abx. Cardiology recommended holding heparin drip that was started overnight for PE as likely representative of septic emboli. PCCM agrees.   2.MSSA bacteremia/sepsis with discitis/vertebral osteomyelitis/psoas abscesses/ septic pulmonary emboli/cavitatory lesions and now with empyema/ RV mural vegatation: in the setting of IV drug abuse. Blood cultures/psoas cultures and respiratory cultures growing MSSA.  Repeat blood cultures from 10/30 x 1 negative so far.Continue IV cefazolin.  Leukocytosis 12-13 K.  Afebrile.  Will plan on PICC line once cleared by ID. PCCM recommends discontinuing central line as soon as possible. Patient has poor peripheral IV access due to drug abuse history.     3.  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  Cultures growing MSSA.  Continue antibiotics as above.  Patient been c/o back pain and asking to increase pain medications for better control. Given elevated BP and tachycardia, will escalate  4.  Hypernatremia: Now improved sodium levels with IV hydration.  Eating and drinking well.    5.  AKI, uremia: Much improved with IV hydration.  BUN peaked to 118 during this hospital course and now  down to 40s.  Mental status improved.    6.  Pericardial effusion: Not very concerning on bedside ultrasonogram per PCCM.  Reported to be mild-moderate on TEE  without any signs of tamponade.  Not sure if related to uremic pericarditis versus reactive to infection. Off IVF now  7.Metabolic encephalopathy: Present on admission in the setting of problem #1, dehydration/AKI/uremia and IV drug use. Improved.  Awake alert oriented x3 now.  MRI head showed abnormal dural enhancement.  Patient could have had meningoencephalitis on presentation.    8.Uncontrolled hypertension: Increase Norvasc dosage early this morning and added hydralazine 25 mg 3 times daily.  Patient had labetalol as needed available.  9.  Normocytic anemia: Could be anemia of chronic disease versus hemolytic anemia in the setting of endocarditis. D/C rectal tube as stool appears to be forming, no melena. Will send stool for occult blood although could be positive in the setting of having a rectal tube in place.Iron studies ordered. Post transfusion hemoglobin improved.  10. Generalized muscle weakness/deconditioning: PT evaluation  DVT prophylaxis: Heparin subcu Code Status: DNR now --confirmed again this morning with patient. Patient understands grave prognosis but states God saved him for a reason and is hopeful of recovery.  Family / Patient Communication: Discussed with patient /PCCM. Called to update patient's primary contact Shanon Brow- no answer, left a message. I did speak to [facility] yesterday in detail regarding overall poor prognosis and plan going forward. Will consult palliative care once out of ICU Disposition Plan: TBD. Will keep in ICU for today     LOS: 7 days    Time spent: 35 minutes    Guilford Shi, MD [facility] Pager [phone]  If 7PM-7AM, please contact night-coverage [email or web address] Password [facility] 03/01/2019, 4:26 PM

## 2019-03-01 NOTE — Procedures (Signed)
Chest Tube Insertion Procedure Note  Indications:  Clinically significant Empyema  Pre-operative Diagnosis: Empyema and ptx  Post-operative Diagnosis: Empyema and ptx  Procedure Details  Informed consent was obtained for the procedure, including sedation.  Risks of lung perforation, hemorrhage, arrhythmia, and adverse drug reaction were discussed.   After sterile skin prep, using standard technique, a 14 French tube was placed in the left posterior lateral rib space.  Findings: Purulent pleural fluid, immediate rapid air rush w/ 7/7 leak followed by 1-2/7 airleak after about 20 seconds.   Estimated Blood Loss:  Minimal         Specimens:  Sent serosanguinous fluid              Complications:  None; patient tolerated the procedure well.         Disposition: ICU - extubated and stable.         Condition: stable Erick Colace ACNP-BC [place] Pager # [phone] OR # [phone] if no answer

## 2019-03-01 NOTE — Progress Notes (Addendum)
..   NAME:  Franklin Anderson, MRN:  [id number], DOB:  07/16/1970, LOS: 7 ADMISSION DATE:  02/21/2019, CONSULTATION DATE:  02/28/2019 REFERRING MD:  Earnest Conroy MD, CHIEF COMPLAINT:  SOB   Brief History   48 yr old M with PMHx of rheumatic fever, HTN, IV heroin abuse presented on 10/27 w/ back pain and lower ext weakness. 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 Recent TEE shows mobile mass moderate in size located in the mid/ apical  RV that appears to be attached to a trabeculation. NP overnight evaluated patient at shift change noted to be tachypneic. New CXR shows left sided loculated pneumothorax. PCCM consulted    Past Medical History  .Marland Kitchen Active Ambulatory Problems    Diagnosis Date Noted  . Shortness of breath 10/01/2015  . Dyspnea 10/01/2015  . HTN (hypertension) 10/01/2015  . 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 Heroin use 10/01/2015  . Tobacco abuse 10/01/2015  . Drug abuse ([place])   . Hypertension 04/27/2010  . H/O: rheumatic fever    Resolved Ambulatory Problems    Diagnosis Date Noted  . No Resolved Ambulatory Problems   No Additional Past Medical History     Significant Hospital Events    10/27 with worsening back pain and BLE weakness.  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 I nitially evaluated by PCCM on 10/28 when pt became obtunded and dyspneic. Pt was endotracheally intubated for acute hypoxemic respiratory failure. He received a Left IJ CVC on 10/28 with follow up CXR showing no complications. He received IV Antibiotics for metastatic MSSAB infection, initially on Vancomycin and Cefepime tapered to Ancef.  He was evaluated with bedside echo and found to have pericardial effusion without tamponade. Formal TTE showed G!DD w EF 60% and possible mitral valve vegetation.  Pt received TEE on 11/3 at [facility] (w/o intubation, tolerated procedure well) and was found to have  mobile mass moderate in size located in the mid/  apical  RV that appears to be attached to a trabeculation.  11/3: NP overnight evaluated patient at shift change noted the pt to be tachypneic. Pt reported to NP that he felt more SOB than baseline not not in acute distress with no significant desaturations. A New CXR was completed: results were called to [facility] by NP.  It shows a new left sided loculated pneumothorax.    Consults:  10/28>>Infectious Disease 10/28>>PCCM 10/29>> Cardiology 11/3>>> PCCM re-consult  Procedures:  10/28>>CT guided aspiration of right psoas abscesses 10/28>>Endotracheally intubated 10/28>> LIJ CVC placement 10/28>>Bronchoscopy with BAL 10/31>>Extubated to 3L [place] by RT 11/3>>> Transesophageal Echo Significant Diagnostic Tests:  11/3 CTA FINDINGS: Cardiovascular: Satisfactory opacification the pulmonary arteries to the segmental level. Segmental and subsegmental pulmonary artery emboli are noted throughout the left lower lobe, lingula and with the lobar, segmental and subsegmental arteries of the right upper lobe. Suspect more extensive embolic burden however given the numerous cavitary lesions throughout the lungs compatible with septic emboli. Flattening of the intraventricular septum and mild elevation of the RV LV ratio (0.95) concerning for right heart strain. Cardiac size remains within normal limits. Few coronary artery calcifications are present. No sizable pericardial effusion though difficult to discern given increased attenuation of the mediastinal fat.  Mediastinum/Nodes: Diffusely increased attenuation of the mediastinal fat suggesting soft tissue edema. Prominent mediastinal and hilar adenopathy, likely reactive. No acute abnormality of the trachea or esophagus. Thyroid gland and thoracic inlet are unremarkable.  Lungs/Pleura: There are numerous rounded and cavitary fluid-filled  lesions throughout the lungs suspicious for septic pulmonary emboli. Some interspersed regions of  ground-glass attenuation and tree-in-bud nodularity noted in the left lung base. There is irregular visceral pleural thickening involving the left lung with a large left hydropneumothorax with dependently layering simple attenuation fluid. Small right pleural effusion present as well with adjacent passive atelectasis.  Upper Abdomen: Nodular hepatic surface contour. Moderate volume upper abdominal ascites.  Musculoskeletal: Nonunited subacute to remote lateral left tenth rib fracture.No acute osseous abnormality or suspicious osseous lesion. Paucity of subcutaneous fat. Body wall edema.    IMPRESSION: 1. Features of widespread septic emboli with more central pulmonary arterial filling defects seen within the left lower lobe, positive for acute PE with CT evidence of right heart strain (RV/LV Ratio = 0.95) consistent with at least submassive (intermediate risk) PE. 2. No visible pneumomediastinum. 3. Irregular visceral pleural thickening involving the left lung with a large left hydropneumothorax, concerning for empyema. 4. Small right pleural effusion and adjacent passive atelectasis. 5. Nodular hepatic surface contour, consistent with cirrhosis. Moderate volume upper abdominal ascites. 6. Additional features of anasarca diffusely increased attenuation of the mediastinal fat and circumferential body wall edema. 7. Aortic Atherosclerosis   11/3 CXR FINDINGS: New loculated left pneumothorax in the setting of cavitary lung lesions. Moderately large pneumothorax and areas of collapse/consolidation in the left lung base also with sub pulmonic lucency and deep sulcus sign. Concomitant left pleural effusion is suspected. Cavitary lesions in the right chest are unchanged. Mild rotation with probable slight mediastinal shift. Post extubation with left IJ central venous catheter terminating at the caval to atrial junction as before. No acute bone process.  MR LUMBAR AND THORACIC  SPINE W W/O CONTRAST IMPRESSION: 1. Abnormal enhancement of the L4 and L5 vertebral bodies and disc space, consistent with discitis osteomyelitis. There is severe bilateral foraminal stenosis at the L4-5 level with abnormal contrast enhancement extending into both neural foramina. 2. Bilateral psoas muscle abscesses, measuring up to 2.6 cm on the right and 2.5 cm on the left. Paraspinal soft tissue inflammation at L4-5 with fluid in both L4-5 facet joints, which may be reactive or indicative of septic arthritis. 3. Abnormal signal and enhancement of the T1 vertebral body with surrounding paraspinal soft tissue enhancement, possibly indicating osteomyelitis. 4. Multiple cavitary lesions within both lungs, consistent with septic emboli.   10/28 US RENAL IMPRESSION: Mild hydronephrosis is noted. This may be related to the distended Bladder. Mild debris within the bladder which may be related to an underlying UTI. Clinical correlation is recommended. Increased echogenicity is noted consistent with medical renal disease.   Micro Data:  Bllood cx x 2 on 10/27>>> + MSSA Blood cx  On 10/28>>>>>>+ MSSA Abscess Rt Psoas>>MODERATE STAPHYLOCOCCUS AUREUS 10/28 resp cx on 10/28>>MODERATE STAPHYLOCOCCUS AUREUS  Repeat Blood cx on 10/30>>NGTD (4 days) Antimicrobials:  Abx course--Cefepime10/28 >>10/29 ,Vanc 10/28 >>10/30 Cefazolin 10/30>>   Subjective   C/o sig back pain  Objective   Blood pressure (Abnormal) 155/110, pulse 100, temperature 97.7 F (36.5 C), temperature source Oral, resp. rate 19, height 6' (1.829 m), weight 53.9 kg, SpO2 96 %.    FiO2 (%):  [55 %] 55 %   Intake/Output Summary (Last 24 hours) at 03/01/2019 0922 Last data filed at 03/01/2019 0538 Gross per 24 hour  Intake 1500.83 ml  Output 625 ml  Net 875.83 ml   Filed Weights   02/28/19 0500 02/28/19 1038 03/01/19 0428  Weight: 63.1 kg 63.1 kg 53.9 kg    Examination: General  frail cachectic white male  still resting in bed w/ sig pain over lower back HENT temporal wasting sclera not icteric MMM Pulm decreased particularly on left. No wheezing or rhonchi Card RRR no MRG abd soft not tender + bowel sounds no OM Ext brisk CR no edema strong pulses Neuro awake and alert, no focal def other than pain  Resolved Hospital Problem list   Uremia and Hypernatremia resolving AKI Extubated on 10/31 Blood cx on 10/ Hypernatremia   Assessment & Plan:   Cavitary PNA w/  Numerous rounded and cavitary fluid-filled lesions in the left lung Left Hydropneumothorax concerning for empyema IR suggested CT surg consult per night shift discussion  Plan: Will call CT surg   Septic +/- VTE Pulmonary emboli  -nml RV fxn -could this actually be related to septic emboli and not actually VTE?? Plan Will get LE dopplers Holding heparin currently as will need chest tube and I really think this is septic emboli and not VTE F/u echo if RV mass less I feel less convinced we need IV heparin  MSSA bacteremia/endocarditis w/ mobile mass in the RV further c/b : Discitis, osteomyelitis, bilateral psoas muscle abscesses, septic emboli of lungs and cavitary PNA also small pericardial effusion TEE shows mobile mass moderate in size located in the mid/ apical  RV that appears to be attached to a trabeculation. Recent cultures remain negative to date for 4 days. 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 ETOH history (pt stated in the past he doesn't drink much ETOH) Nodular hepatic surface contour, consistent with cirrhosis and Moderate volume upper abdominal ascites on recent CT imaging Plan: Trend coags  Normocytic Anemia Iron low normal and TIBC  low 133 Plan: Trend cbc Transfuse for hgb < 7  Intravascular hemolytic process>>Schistocytes on smear  Plts 252>>182, No active bleeding Uremia, mental status all improving, Recent blood cultures negative but may all be related to endocarditis  Plan Cont supportive care   Best practice:  Diet: keep NPO for possible procedure Pain/Anxiety/Delirium protocol (if indicated): for back pain per primary VAP protocol (if indicated): not intubated  DVT prophylaxis: per primary GI prophylaxis: per primary Glucose control: per primary Mobility: bedrest Code Status: no shock no compressions or intubation Prognosis: poor. Strongly recommend Palliative consult Disposition: remains on SD with [facility]     Critical care time:NA     Erick Colace ACNP-BC [place] Pager # [phone] OR # [phone] if no answer

## 2019-03-01 NOTE — Progress Notes (Signed)
Bilateral lower extremity venous duplex has been completed. Preliminary results can be found in CV Proc through chart review.   03/01/19 1:19 PM Carlos Levering RVT

## 2019-03-01 NOTE — Progress Notes (Signed)
Subjective:  Still complaining of considerable back pain  Antibiotics:  Anti-infectives (From admission, onward)   Start     Dose/Rate Route Frequency Ordered Stop   02/24/19 2200  vancomycin (VANCOCIN) IVPB 1000 mg/200 mL premix  Status:  Discontinued     1,000 mg 200 mL/hr over 60 Minutes Intravenous Every 12 hours 02/24/19 1010 02/24/19 1253   02/24/19 1600  ceFAZolin (ANCEF) IVPB 2g/100 mL premix     2 g 200 mL/hr over 30 Minutes Intravenous Every 8 hours 02/24/19 1253     02/23/19 2200  vancomycin (VANCOCIN) IVPB 750 mg/150 ml premix  Status:  Discontinued     750 mg 150 mL/hr over 60 Minutes Intravenous Every 24 hours 02/23/19 0853 02/23/19 0909   02/23/19 2200  vancomycin (VANCOCIN) IVPB 750 mg/150 ml premix  Status:  Discontinued     750 mg 150 mL/hr over 60 Minutes Intravenous Every 12 hours 02/23/19 0909 02/24/19 1010   02/22/19 2200  vancomycin (VANCOCIN) IVPB 750 mg/150 ml premix  Status:  Discontinued     750 mg 150 mL/hr over 60 Minutes Intravenous Every 24 hours 02/22/19 0826 02/22/19 0827   02/22/19 2200  vancomycin (VANCOCIN) IVPB 1000 mg/200 mL premix  Status:  Discontinued     1,000 mg 200 mL/hr over 60 Minutes Intravenous Every 24 hours 02/22/19 0827 02/23/19 0853   02/22/19 0900  ceFEPIme (MAXIPIME) 2 g in sodium chloride 0.9 % 100 mL IVPB  Status:  Discontinued     2 g 200 mL/hr over 30 Minutes Intravenous Every 12 hours 02/22/19 0826 02/23/19 1135   02/21/19 2015  ceFEPIme (MAXIPIME) 2 g in sodium chloride 0.9 % 100 mL IVPB     2 g 200 mL/hr over 30 Minutes Intravenous  Once 02/21/19 2013 02/22/19 0031   02/21/19 2015  metroNIDAZOLE (FLAGYL) IVPB 500 mg     500 mg 100 mL/hr over 60 Minutes Intravenous  Once 02/21/19 2013 02/22/19 0031   02/21/19 2015  vancomycin (VANCOCIN) IVPB 1000 mg/200 mL premix     1,000 mg 200 mL/hr over 60 Minutes Intravenous  Once 02/21/19 2013 02/22/19 0200      Medications: Scheduled Meds:  amLODipine  10 mg  Oral Daily   Chlorhexidine Gluconate Cloth  6 each Topical Daily   feeding supplement (ENSURE ENLIVE)  237 mL Oral BID BM   insulin aspart  0-24 Units Subcutaneous Q4H   ipratropium  0.5 mg Nebulization BID   levalbuterol  1.25 mg Nebulization BID   mouth rinse  15 mL Mouth Rinse BID   multivitamin with minerals  1 tablet Oral Daily   pantoprazole  40 mg Oral Daily   sodium chloride flush  10-40 mL Intracatheter Q12H   Continuous Infusions:   ceFAZolin (ANCEF) IV Stopped (03/01/19 0531)   heparin 1,250 Units/hr (03/01/19 0538)   sodium chloride     PRN Meds:.acetaminophen **OR** acetaminophen, acetaminophen, clonazePAM, hydrALAZINE, HYDROmorphone (DILAUDID) injection, ipratropium-albuterol, ondansetron **OR** ondansetron (ZOFRAN) IV, oxyCODONE-acetaminophen, senna-docusate, sodium chloride flush    Objective: Weight change: 0 kg  Intake/Output Summary (Last 24 hours) at 03/01/2019 0953 Last data filed at 03/01/2019 0538 Gross per 24 hour  Intake 1500.83 ml  Output 625 ml  Net 875.83 ml   Blood pressure (!) 168/113, pulse 100, temperature 97.7 F (36.5 C), temperature source Oral, resp. rate 19, height 6' (1.829 m), weight 53.9 kg, SpO2 96 %. Temp:  [97.7 F (36.5 C)-98.7 F (37.1 C)] 97.7 F (36.5  C) (11/04 0800) Pulse Rate:  [59-119] 100 (11/04 0000) Resp:  [16-43] 19 (11/04 0000) BP: (116-168)/(72-124) 168/113 (11/04 0933) SpO2:  [92 %-100 %] 96 % (11/04 0747) FiO2 (%):  [55 %] 55 % (11/03 2218) Weight:  [53.9 kg-63.1 kg] 53.9 kg (11/04 0428)  Physical Exam: General: Alert and awake, oriented x3,cachectic, facial wasting HEENT: anicteric sclera, EOMI CVStach rate, normal  No mgr Chest: , no wheezing,fairly  clear anteiorly Abdomen: soft non-distended,  Extremities: no edema Skin: no rashes Neuro: nonfocal  CBC:    BMET Recent Labs    02/28/19 0432 03/01/19 0418  NA 138 140  K 4.3 3.9  CL 107 107  CO2 26 26  GLUCOSE 104* 131*  BUN 34* 30*    CREATININE 0.43* 0.48*  CALCIUM 7.9* 7.7*     Liver Panel  No results for input(s): PROT, ALBUMIN, AST, ALT, ALKPHOS, BILITOT, BILIDIR, IBILI in the last 72 hours.     Sedimentation Rate No results for input(s): ESRSEDRATE in the last 72 hours. C-Reactive Protein No results for input(s): CRP in the last 72 hours.  Micro Results: Recent Results (from the past 720 hour(s))  Blood Culture (routine x 2)     Status: Abnormal   Collection Time: 02/21/19  8:13 PM   Specimen: BLOOD LEFT FOREARM  Result Value Ref Range Status   Specimen Description   Final    BLOOD LEFT FOREARM Performed at [facility], [address], [place], [place] [address]    Special Requests   Final    BOTTLES DRAWN AEROBIC AND ANAEROBIC Blood Culture results may not be optimal due to an inadequate volume of blood received in culture bottles Performed at [facility] [address] [place], [place] [address]    Culture  Setup Time   Final    GRAM POSITIVE COCCI IN BOTH AEROBIC AND ANAEROBIC BOTTLES CRITICAL RESULT CALLED TO, READ BACK BY AND VERIFIED WITH: PHARMD J LEGGE [id number] AT 1337 BY CM CRITICAL RESULT CALLED TO, READ BACK BY AND VERIFIED WITH: PHARMD E JACKSON [id number] AT 1357 BY CM Performed at [facility], [place] [address] [place], [place] [address]    Culture STAPHYLOCOCCUS AUREUS (A)  Final   Report Status 02/24/2019 FINAL  Final   Organism ID, Bacteria STAPHYLOCOCCUS AUREUS  Final      Susceptibility   Staphylococcus aureus - MIC*    CIPROFLOXACIN <=0.5 SENSITIVE Sensitive     ERYTHROMYCIN >=8 RESISTANT Resistant     GENTAMICIN <=0.5 SENSITIVE Sensitive     OXACILLIN 0.5 SENSITIVE Sensitive     TETRACYCLINE <=1 SENSITIVE Sensitive     VANCOMYCIN 1 SENSITIVE Sensitive     TRIMETH/SULFA <=10 SENSITIVE Sensitive     CLINDAMYCIN <=0.25 SENSITIVE Sensitive     RIFAMPIN <=0.5 SENSITIVE Sensitive     Inducible Clindamycin NEGATIVE Sensitive     * STAPHYLOCOCCUS  AUREUS  SARS Coronavirus 2 by RT PCR (hospital order, performed in [facility] lab) Nasopharyngeal Nasopharyngeal Swab     Status: None   Collection Time: 02/21/19  8:16 PM   Specimen: Nasopharyngeal Swab  Result Value Ref Range Status   SARS Coronavirus 2 NEGATIVE NEGATIVE Final    Comment: (NOTE) If result is NEGATIVE SARS-CoV-2 target nucleic acids are NOT DETECTED. The SARS-CoV-2 RNA is generally detectable in upper and lower  respiratory specimens during the acute phase of infection. The lowest  concentration of SARS-CoV-2 viral copies this assay can detect is 250  copies / mL. A negative result does not preclude SARS-CoV-2 infection  and should not be used as the sole basis for treatment or other  patient management decisions.  A negative result may occur with  improper specimen collection / handling, submission of specimen other  than nasopharyngeal swab, presence of viral mutation(s) within the  areas targeted by this assay, and inadequate number of viral copies  (<250 copies / mL). A negative result must be combined with clinical  observations, patient history, and epidemiological information. If result is POSITIVE SARS-CoV-2 target nucleic acids are DETECTED. The SARS-CoV-2 RNA is generally detectable in upper and lower  respiratory specimens dur ing the acute phase of infection.  Positive  results are indicative of active infection with SARS-CoV-2.  Clinical  correlation with patient history and other diagnostic information is  necessary to determine patient infection status.  Positive results do  not rule out bacterial infection or co-infection with other viruses. If result is PRESUMPTIVE POSTIVE SARS-CoV-2 nucleic acids MAY BE PRESENT.   A presumptive positive result was obtained on the submitted specimen  and confirmed on repeat testing.  While 2019 novel coronavirus  (SARS-CoV-2) nucleic acids may be present in the submitted sample  additional confirmatory  testing may be necessary for epidemiological  and / or clinical management purposes  to differentiate between  SARS-CoV-2 and other Sarbecovirus currently known to infect humans.  If clinically indicated additional testing with an alternate test  methodology [phone]) is advised. The SARS-CoV-2 RNA is generally  detectable in upper and lower respiratory sp ecimens during the acute  phase of infection. The expected result is Negative. Fact Sheet for Patients:  StrictlyIdeas.no Fact Sheet for Healthcare Providers: BankingDealers.co.za This test is not yet approved or cleared by the [place] FDA and has been authorized for detection and/or diagnosis of SARS-CoV-2 by FDA under an Emergency Use Authorization (EUA).  This EUA will remain in effect (meaning this test can be used) for the duration of the COVID-19 declaration under Section 564(b)(1) of the Act, 21 U.S.C. section 360bbb-3(b)(1), unless the authorization is terminated or revoked sooner. Performed at [facility], [place] [address], [place], [place] [address]   MRSA PCR Screening     Status: None   Collection Time: 02/22/19  4:56 AM   Specimen: Nasal Mucosa; Nasopharyngeal  Result Value Ref Range Status   MRSA by PCR NEGATIVE NEGATIVE Final    Comment:        The GeneXpert MRSA Assay (FDA approved for NASAL specimens only), is one component of a comprehensive MRSA colonization surveillance program. It is not intended to diagnose MRSA infection nor to guide or monitor treatment for MRSA infections. Performed at [facility], [place] [address], [place], [place] [address]   Blood Culture (routine x 2)     Status: Abnormal   Collection Time: 02/22/19  5:33 AM   Specimen: BLOOD  Result Value Ref Range Status   Specimen Description   Final    BLOOD LEFT ANTECUBITAL Performed at [facility] [address]  [place], [place] [address]    Special Requests   Final    BOTTLES DRAWN AEROBIC AND ANAEROBIC Blood Culture adequate volume Performed at [facility] [address] [place], [place] [address]    Culture  Setup Time   Final    GRAM POSITIVE COCCI ANAEROBIC BOTTLE ONLY CRITICAL RESULT CALLED TO, READ BACK BY AND VERIFIED WITH: [place] [id number] ER [id number]  BY CM    Culture (A)  Final    STAPHYLOCOCCUS AUREUS SUSCEPTIBILITIES PERFORMED ON PREVIOUS CULTURE WITHIN THE LAST 5 DAYS. Performed at [facility], [place] [address], [place], [place] [address]    Report Status 02/24/2019 FINAL  Final  Aerobic/Anaerobic Culture (surgical/deep wound)     Status: None   Collection Time: 02/22/19 10:05 AM   Specimen: Abscess  Result Value Ref Range Status   Specimen Description   Final    ABSCESS RT PSOAS Performed at [facility] [address] [place], [place] [address]    Special Requests   Final    NONE Performed at [facility], [place] [address] [place], [place] [address]    Gram Stain   Final    RARE WBC PRESENT,BOTH PMN AND MONONUCLEAR FEW GRAM POSITIVE COCCI IN PAIRS    Culture   Final    MODERATE STAPHYLOCOCCUS AUREUS NO ANAEROBES ISOLATED Performed at [facility], [place] [address] [place], [place] [address]    Report Status 02/27/2019 FINAL  Final   Organism ID, Bacteria STAPHYLOCOCCUS AUREUS  Final      Susceptibility   Staphylococcus aureus - MIC*    CIPROFLOXACIN <=0.5 SENSITIVE Sensitive     ERYTHROMYCIN >=8 RESISTANT Resistant     GENTAMICIN <=0.5 SENSITIVE Sensitive     OXACILLIN 0.5 SENSITIVE Sensitive     TETRACYCLINE <=1 SENSITIVE Sensitive     VANCOMYCIN <=0.5 SENSITIVE Sensitive     TRIMETH/SULFA <=10 SENSITIVE Sensitive     CLINDAMYCIN <=0.25 SENSITIVE Sensitive     RIFAMPIN <=0.5 SENSITIVE Sensitive     Inducible Clindamycin NEGATIVE Sensitive     * MODERATE STAPHYLOCOCCUS AUREUS  Culture,  respiratory (non-expectorated)     Status: None   Collection Time: 02/22/19  5:30 PM   Specimen: Bronchoalveolar Lavage; Respiratory  Result Value Ref Range Status   Specimen Description   Final    BRONCHIAL ALVEOLAR LAVAGE Performed at [facility] [address], [place], [place] [address]    Special Requests   Final    NONE Performed at [facility], [place] [address], [place], [place] [address]    Gram Stain   Final    ABUNDANT WBC PRESENT, PREDOMINANTLY PMN MODERATE GRAM POSITIVE COCCI Performed at [facility], [place] [address] [place], [place] [address]    Culture MODERATE STAPHYLOCOCCUS AUREUS  Final   Report Status 02/25/2019 FINAL  Final   Organism ID, Bacteria STAPHYLOCOCCUS AUREUS  Final      Susceptibility   Staphylococcus aureus - MIC*    CIPROFLOXACIN <=0.5 SENSITIVE Sensitive     ERYTHROMYCIN >=8 RESISTANT Resistant     GENTAMICIN <=0.5 SENSITIVE Sensitive     OXACILLIN 0.5 SENSITIVE Sensitive     TETRACYCLINE <=1 SENSITIVE Sensitive     VANCOMYCIN 1 SENSITIVE Sensitive     TRIMETH/SULFA <=10 SENSITIVE Sensitive     CLINDAMYCIN <=0.25 SENSITIVE Sensitive     RIFAMPIN <=0.5 SENSITIVE Sensitive     Inducible Clindamycin NEGATIVE Sensitive     * MODERATE STAPHYLOCOCCUS AUREUS  Culture, blood (Routine X 2) w Reflex to ID Panel     Status: None (Preliminary result)   Collection Time: 02/24/19 11:47 AM   Specimen: BLOOD LEFT HAND  Result Value Ref Range Status   Specimen Description   Final    BLOOD LEFT HAND Performed at [facility], [place] [address] [place],  [address]  Special Requests   Final    BOTTLES DRAWN AEROBIC AND ANAEROBIC Blood Culture adequate volume Performed at [facility] [address], [place], [place] [address]    Culture   Final    NO GROWTH 4 DAYS Performed at [facility], [place] [address] [place], [place] [address]    Report Status PENDING   Incomplete    Studies/Results: Ct Angio Chest Pe W Or Wo Contrast  Addendum Date: 02/28/2019   ADDENDUM REPORT: 02/28/2019 22:19 ADDENDUM: Minimal mediastinal shift. Results and addendum were called by telephone at the time of interpretation on 02/28/2019 at 10:19 pm to provider NP Baltazar Najjar , who verbally acknowledged these results. Electronically Signed   By: Lovena Le M.D.   On: 02/28/2019 22:19   Result Date: 02/28/2019 CLINICAL DATA:  Increasing shortness of breath post transesophageal ECHO EXAM: CT ANGIOGRAPHY CHEST WITH CONTRAST TECHNIQUE: Multidetector CT imaging of the chest was performed using the standard protocol during bolus administration of intravenous contrast. Multiplanar CT image reconstructions and MIPs were obtained to evaluate the vascular anatomy. CONTRAST:  162m OMNIPAQUE IOHEXOL 350 MG/ML SOLN COMPARISON:  Same-day radiograph FINDINGS: Cardiovascular: Satisfactory opacification the pulmonary arteries to the segmental level. Segmental and subsegmental pulmonary artery emboli are noted throughout the left lower lobe, lingula and with the lobar, segmental and subsegmental arteries of the right upper lobe. Suspect more extensive embolic burden however given the numerous cavitary lesions throughout the lungs compatible with septic emboli. Flattening of the intraventricular septum and mild elevation of the RV LV ratio (0.95) concerning for right heart strain. Cardiac size remains within normal limits. Few coronary artery calcifications are present. No sizable pericardial effusion though difficult to discern given increased attenuation of the mediastinal fat. Mediastinum/Nodes: Diffusely increased attenuation of the mediastinal fat suggesting soft tissue edema. Prominent mediastinal and hilar adenopathy, likely reactive. No acute abnormality of the trachea or esophagus. Thyroid gland and thoracic inlet are unremarkable. Lungs/Pleura: There are numerous rounded and cavitary fluid-filled  lesions throughout the lungs suspicious for septic pulmonary emboli. Some interspersed regions of ground-glass attenuation and tree-in-bud nodularity noted in the left lung base. There is irregular visceral pleural thickening involving the left lung with a large left hydropneumothorax with dependently layering simple attenuation fluid. Small right pleural effusion present as well with adjacent passive atelectasis. Upper Abdomen: Nodular hepatic surface contour. Moderate volume upper abdominal ascites. Musculoskeletal: Nonunited subacute to remote lateral left tenth rib fracture.No acute osseous abnormality or suspicious osseous lesion. Paucity of subcutaneous fat. Body wall edema. Review of the MIP images confirms the above findings. IMPRESSION: 1. Features of widespread septic emboli with more central pulmonary arterial filling defects seen within the left lower lobe, positive for acute PE with CT evidence of right heart strain (RV/LV Ratio = 0.95) consistent with at least submassive (intermediate risk) PE. The presence of right heart strain has been associated with an increased risk of morbidity and mortality. Please activate Code PE by paging [phone] 2. No visible pneumomediastinum. 3. Irregular visceral pleural thickening involving the left lung with a large left hydropneumothorax, concerning for empyema. 4. Small right pleural effusion and adjacent passive atelectasis. 5. Nodular hepatic surface contour, consistent with cirrhosis. Moderate volume upper abdominal ascites. 6. Additional features of anasarca diffusely increased attenuation of the mediastinal fat and circumferential body wall edema. 7. Aortic Atherosclerosis (ICD10-I70.0). Currently awaiting provider callback. Addendum will be submitted following contact with the ordering provider or care team. Electronically Signed: By: PLovena LeM.D. On: 02/28/2019  22:11   Dg Chest Port 1 View  Addendum Date: 02/28/2019   ADDENDUM REPORT: 02/28/2019  19:27 ADDENDUM: Critical Value/emergent results were called by telephone at the time of interpretation on 02/28/2019 at 7:27 pm to providerKaren Baltazar Najjar, who verbally acknowledged these results. Electronically Signed   By: Zetta Bills M.D.   On: 02/28/2019 19:27   Result Date: 02/28/2019 CLINICAL DATA:  Elevated heart rate and dyspnea. EXAM: PORTABLE CHEST 1 VIEW COMPARISON:  02/24/2019 FINDINGS: New loculated left pneumothorax in the setting of cavitary lung lesions. Moderately large pneumothorax and areas of collapse/consolidation in the left lung base also with sub pulmonic lucency and deep sulcus sign. Concomitant left pleural effusion is suspected. Cavitary lesions in the right chest are unchanged. Mild rotation with probable slight mediastinal shift. Post extubation with left IJ central venous catheter terminating at the caval to atrial junction as before. No acute bone process. IMPRESSION: 1. New loculated left pneumothorax in the setting of cavitary lung lesions. Mediastinal shift while slight is present on the current study. 2. Stable cavitary lesions in the right chest. 3. Post extubation. 4. A call is out to the referring provider to further discuss above findings. Electronically Signed: By: Zetta Bills M.D. On: 02/28/2019 19:19      Assessment/Plan:  INTERVAL HISTORY: Patient underwent transesophageal echocardiogram which on formal report does not mention vegetation on the mitral valve  He also developed a loculated hydro-neumothorax overnight with mediastinal shift and was evaluated by critical care medicine   Principal Problem:   Sepsis ([facility]) Active Problems:   Dyspnea   Heroin use   Endocarditis   Septic pulmonary embolism ([place])   Osteomyelitis of thoracic spine (HCC)   Osteomyelitis of lumbar spine (HCC)   Acute encephalopathy   AKI (acute kidney injury) ([place])   Transaminitis   Psoas muscle abscess (HCC)   Pressure injury of skin   Mucus clot in bronchi    Protein-calorie malnutrition, severe   Abnormal echocardiogram   MSSA bacteremia   Acute pulmonary embolism (HCC)    Franklin Anderson is a 48 y.o. 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  There were no evidence of vegetations on the mitral valve but clearly he had them since he has septic embolization to the lungs with cavitary pathology and now loculated pneumothorax concerning for empyema he also has a pulmonary embolism  #1 Metastatic MSSAB as described:       [facility] Management Team Staphylococcus aureus bacteremia   Staphylococcus aureus bacteremia (SAB) is associated with a high rate of complications and mortality.  Specific aspects of clinical management are critical to optimizing the outcome of patients with SAB.  Therefore, the [facility] Antimicrobial Management Team [facility]) has initiated an intervention aimed at improving the management of SAB at [facility].  To do so, Infectious Diseases physicians are providing an evidence-based consult for the management of all patients with SAB.     Yes No Comments  Perform follow-up blood cultures (even if the patient is afebrile) to ensure clearance of bacteremia _0  _1  Need to repeat AGAIN post removal of central line  Remove vascular catheter and obtain follow-up blood cultures after the removal of the catheter _2  _3  CENTRA LINE NEEDS TO COME OUT when possible  Perform echocardiography to evaluate for endocarditis (transthoracic ECHO is 40-50% sensitive, TEE is > 90% sensitive) _4  _5  TEE negative but undoubtedly has  simply thrown vegetqation into lungs  Consult electrophysiologist to evaluate implanted cardiac device (pacemaker, ICD) _0  _1    Ensure source control _2  _3  Need to monitor Neuro exam and will need CT surgery consult  Investigate for metastatic sites of infection _4  _5  Does the patient  have ANY symptom or physical exam finding that would suggest a deeper infection (back or neck pain that may be suggestive of vertebral osteomyelitis or epidural abscess, muscle pain that could be a symptom of pyomyositis)?  Keep in mind that for deep seeded infections MRI imaging with contrast is preferred rather than other often insensitive tests such as plain x-rays, especially early in a patient's presentation.  Change antibiotic therapy to cefazolin _6  _7  Beta-lactam antibiotics are preferred for MSSA due to higher cure rates.   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  LOS: 7 days   Alcide Evener 03/01/2019, 9:53 AM

## 2019-03-01 NOTE — Progress Notes (Signed)
PT Cancellation Note  Patient Details Name: THAYER EMBLETON MRN: [id number] DOB: 10-31-1970   Cancelled Treatment:    Reason Eval/Treat Not Completed: Medical issues which prohibited therapy, Chest tube placed today. Will check back another day.   Claretha Cooper 03/01/2019, 1:55 PM  [place] Pager [phone] Office [phone]

## 2019-03-02 ENCOUNTER — Encounter (HOSPITAL_COMMUNITY): Payer: Self-pay | Admitting: Cardiovascular Disease

## 2019-03-02 ENCOUNTER — Inpatient Hospital Stay (HOSPITAL_COMMUNITY): Payer: Self-pay

## 2019-03-02 DIAGNOSIS — J939 Pneumothorax, unspecified: Secondary | ICD-10-CM

## 2019-03-02 DIAGNOSIS — Z978 Presence of other specified devices: Secondary | ICD-10-CM

## 2019-03-02 DIAGNOSIS — M4627 Osteomyelitis of vertebra, lumbosacral region: Secondary | ICD-10-CM

## 2019-03-02 LAB — BASIC METABOLIC PANEL
Anion gap: 8 (ref 5–15)
BUN: 27 mg/dL — ABNORMAL HIGH (ref 6–20)
CO2: 25 mmol/L (ref 22–32)
Calcium: 7.7 mg/dL — ABNORMAL LOW (ref 8.9–10.3)
Chloride: 105 mmol/L (ref 98–111)
Creatinine, Ser: 0.47 mg/dL — ABNORMAL LOW (ref 0.61–1.24)
GFR calc Af Amer: >60 mL/min (ref >60)
GFR calc non Af Amer: >60 mL/min (ref >60)
Glucose, Bld: 106 mg/dL — ABNORMAL HIGH (ref 70–99)
Potassium: 3.9 mmol/L (ref 3.5–5.1)
Sodium: 138 mmol/L (ref 135–145)

## 2019-03-02 LAB — CBC
HCT: 25 % — ABNORMAL LOW (ref 39.0–52.0)
Hemoglobin: 7.7 g/dL — ABNORMAL LOW (ref 13.0–17.0)
MCH: 28 pg (ref 26.0–34.0)
MCHC: 30.8 g/dL (ref 30.0–36.0)
MCV: 90.9 fL (ref 80.0–100.0)
Platelets: 170 10*3/uL (ref 150–400)
RBC: 2.75 MIL/uL — ABNORMAL LOW (ref 4.22–5.81)
RDW: 15.8 % — ABNORMAL HIGH (ref 11.5–15.5)
WBC: 7.9 10*3/uL (ref 4.0–10.5)
nRBC: 0 % (ref 0.0–0.2)

## 2019-03-02 LAB — GLUCOSE, CAPILLARY
Glucose-Capillary: 85 mg/dL (ref 70–99)
Glucose-Capillary: 138 mg/dL — ABNORMAL HIGH (ref 70–99)
Glucose-Capillary: 105 mg/dL — ABNORMAL HIGH (ref 70–99)
Glucose-Capillary: 107 mg/dL — ABNORMAL HIGH (ref 70–99)

## 2019-03-02 IMAGING — DX DG CHEST 1V PORT
1 series · 2 of 2 positions shown · non-contrast
Comparison: Chest x-ray [DATE] and chest CT [DATE]

CLINICAL DATA: Follow-up pleural effusion.

EXAM:
PORTABLE CHEST 1 VIEW

[Series 1: chest ap · 0.14mm/px · 2 of 2 slices shown]
[im 1/2]
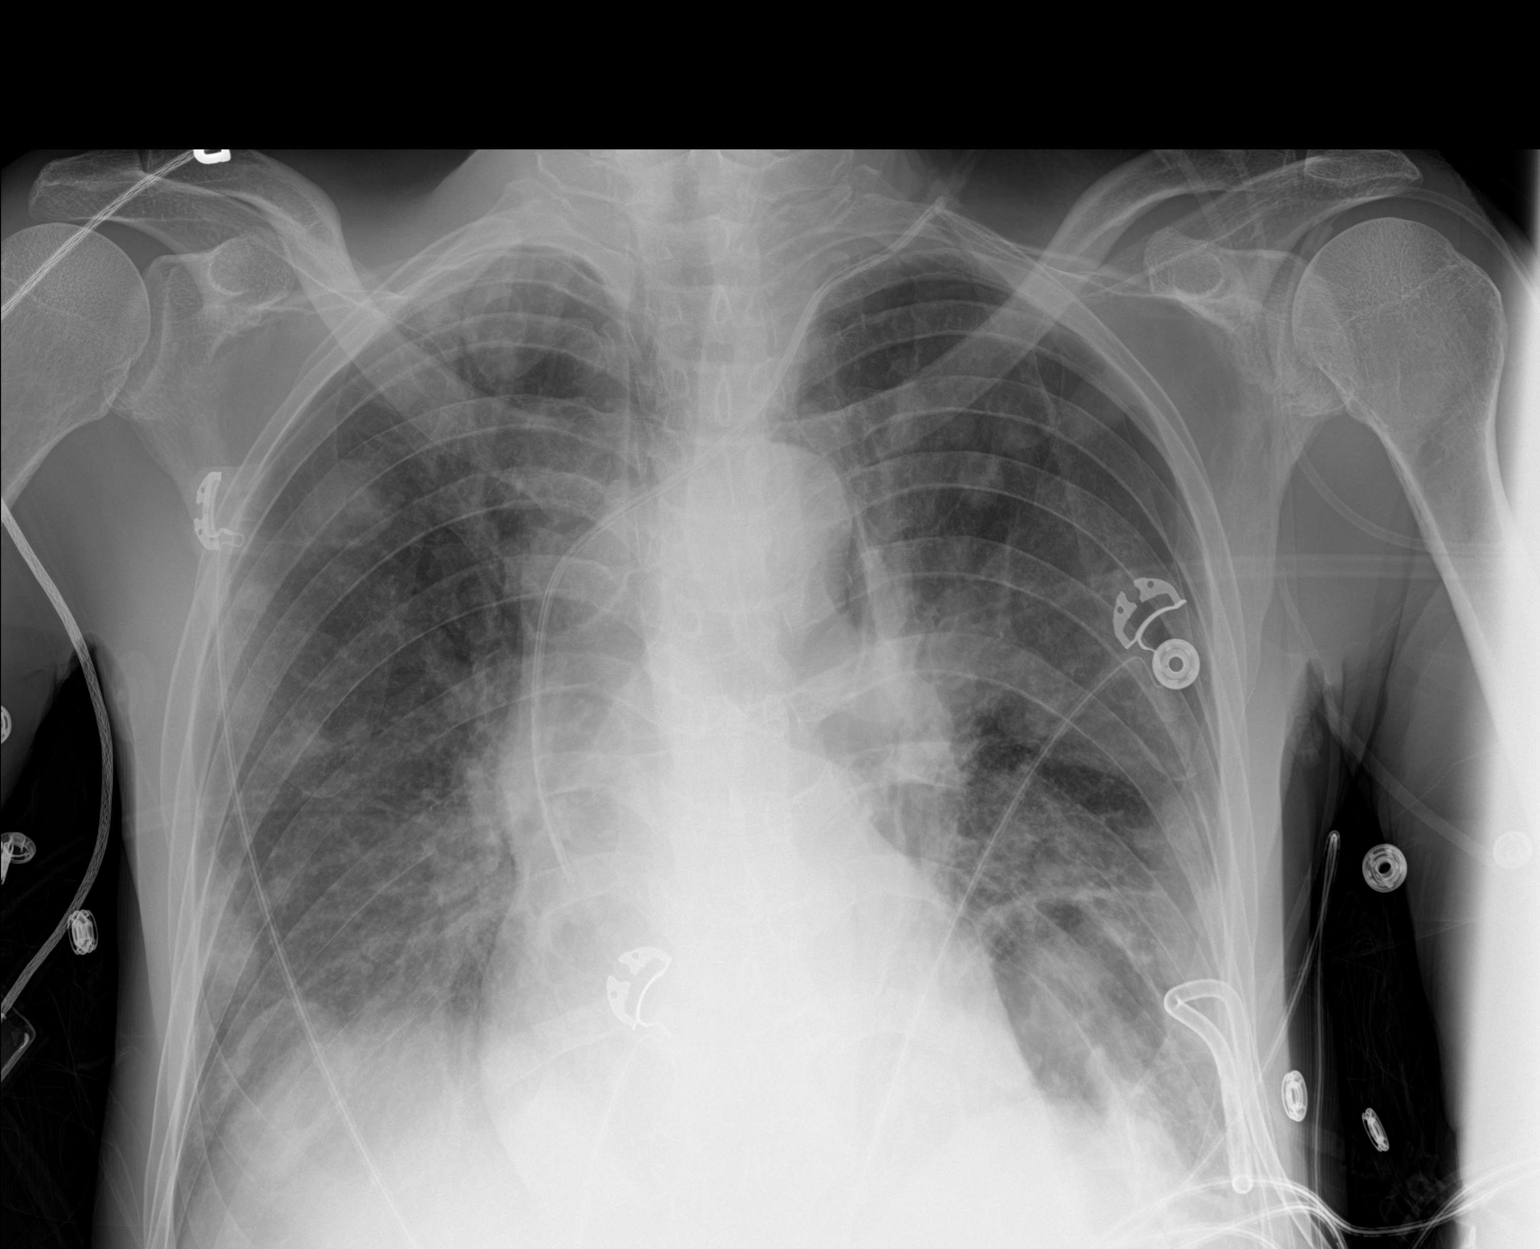
[im 2/2]
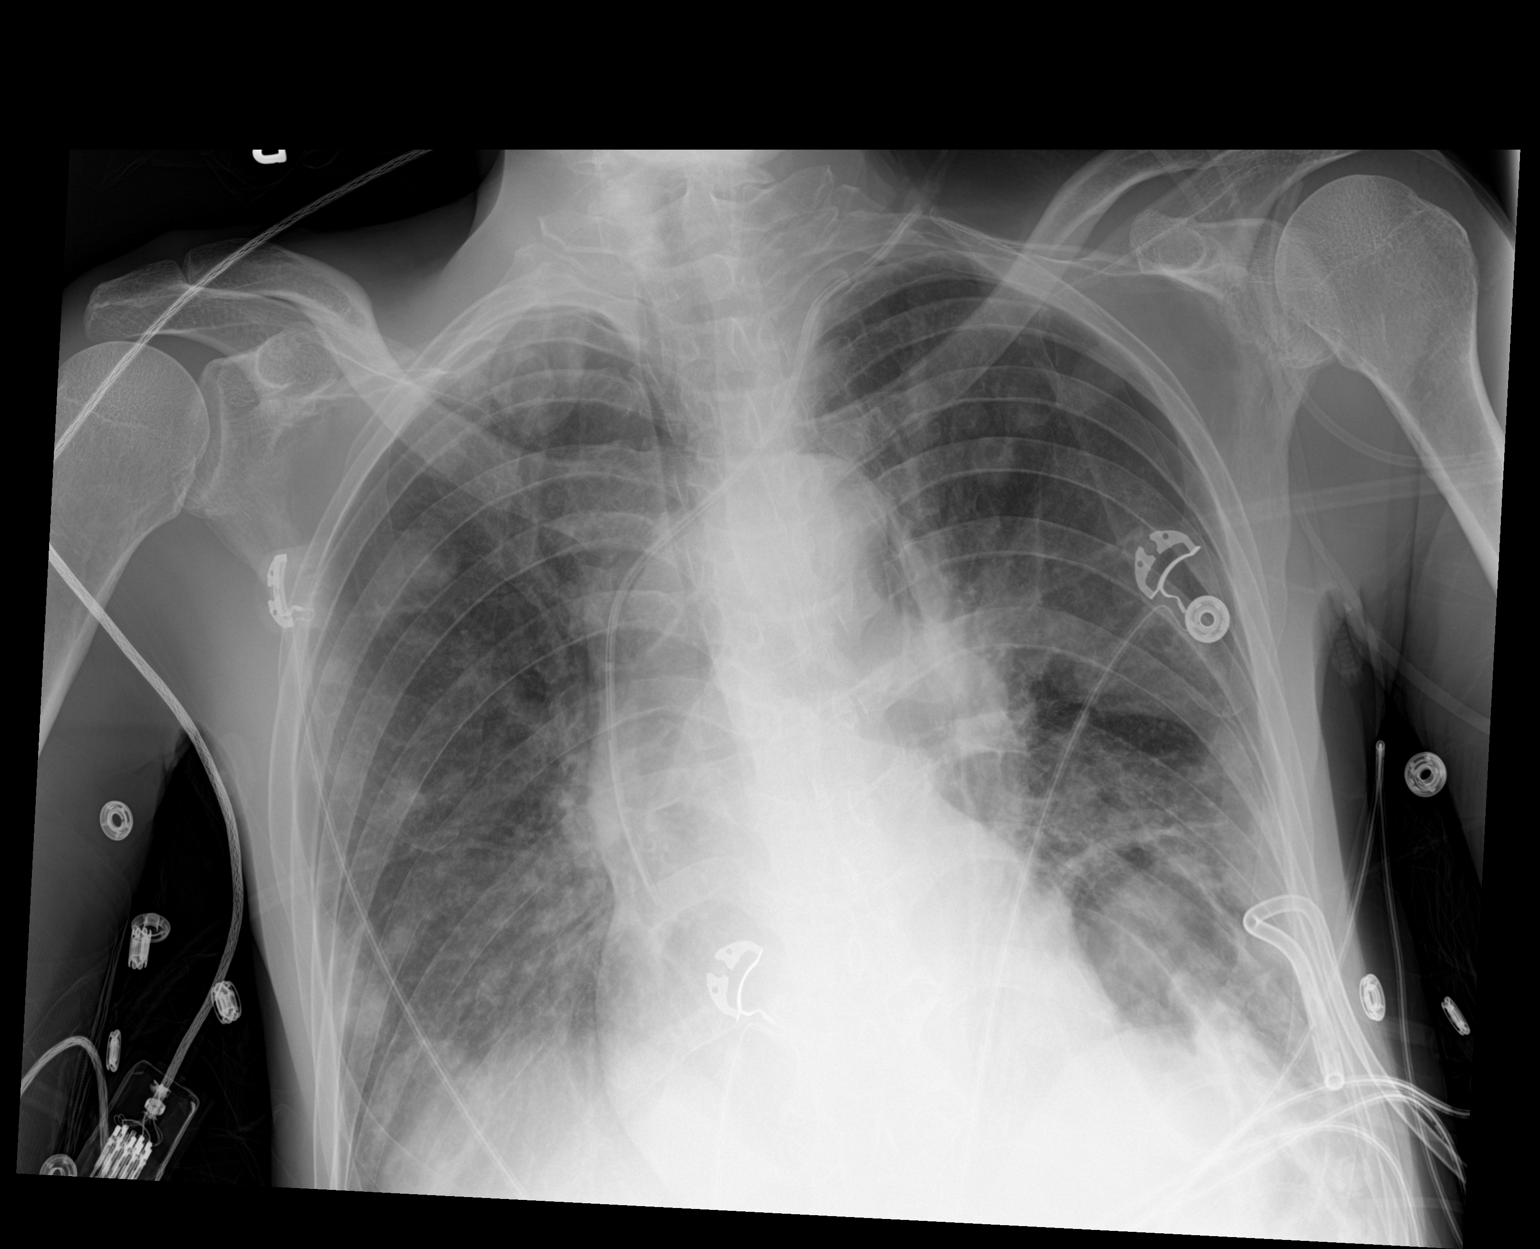

[2 of 2 positions shown; findings below may reference images not displayed]

FINDINGS: The cardiac silhouette, mediastinal and hilar contours are stable.
Stable left IJ central venous catheter. The left-sided pleural
drainage catheter is in stable position. Interval decrease in size
of the left-sided pneumothorax. Stable numerous cavitary lesions in
both lungs consistent with septic emboli.
IMPRESSION: Left chest tube in good position with interval decrease in size of
the left-sided pneumothorax.

Stable bilateral septic emboli and infiltrates.

## 2019-03-02 MED ORDER — METOPROLOL TARTRATE 25 MG PO TABS
25.0000 mg | ORAL_TABLET | Freq: Two times a day (BID) | ORAL | Status: DC
  Administered 2019-03-02 (×2): 25 mg via ORAL
  Filled 2019-03-02 (×2): qty 1

## 2019-03-02 NOTE — Progress Notes (Signed)
..   NAME:  Franklin Anderson, MRN:  [id number], DOB:  [birth year], LOS: 8 ADMISSION DATE:  02/21/2019, CONSULTATION DATE:  02/28/2019 REFERRING MD:  Earnest Conroy MD, CHIEF COMPLAINT:  SOB   Brief History   48 yr old M with PMHx of rheumatic fever, HTN, IV heroin abuse presented on 10/27 w/ back pain and lower ext weakness. 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 Recent TEE shows mobile mass moderate in size located in the mid/ apical  RV that appears to be attached to a trabeculation. NP overnight evaluated patient at shift change noted to be tachypneic. New CXR shows left sided loculated pneumothorax. PCCM consulted    Past Medical History  .Marland Kitchen Active Ambulatory Problems    Diagnosis Date Noted  . Shortness of breath 10/01/2015  . Dyspnea 10/01/2015  . HTN (hypertension) 10/01/2015  . Methadone use ([place]) 10/01/2015  . Heroin use 10/01/2015  . Tobacco abuse 10/01/2015  . Drug abuse ([place])   . Hypertension 04/27/2010  . H/O: rheumatic fever    Resolved Ambulatory Problems    Diagnosis Date Noted  . No Resolved Ambulatory Problems   No Additional Past Medical History     Significant Hospital Events    10/27 with worsening back pain and BLE weakness.  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 I nitially evaluated by PCCM on 10/28 when pt became obtunded and dyspneic. Pt was endotracheally intubated for acute hypoxemic respiratory failure. He received a Left IJ CVC on 10/28 with follow up CXR showing no complications. He received IV Antibiotics for metastatic MSSAB infection, initially on Vancomycin and Cefepime tapered to Ancef.  He was evaluated with bedside echo and found to have pericardial effusion without tamponade. Formal TTE showed G!DD w EF 60% and possible mitral valve vegetation.  Pt received TEE on 11/3 at [facility] (w/o intubation, tolerated procedure well) and was found to have  mobile mass moderate in size located in the mid/  apical  RV that appears to be attached to a trabeculation.  11/3: NP overnight evaluated patient at shift change noted the pt to be tachypneic. Pt reported to NP that he felt more SOB than baseline not not in acute distress with no significant desaturations. A New CXR was completed: results were called to [facility] by NP.  It shows a new left sided loculated pneumothorax.    Consults:  10/28>>Infectious Disease 10/28>>PCCM 10/29>> Cardiology 11/3>>> PCCM re-consult 11/4: Phone consult with thoracic surgery Procedures:  10/28>>CT guided aspiration of right psoas abscesses 10/28>>Endotracheally intubated 10/28>> LIJ CVC placement 10/28>>Bronchoscopy with BAL 10/31>>Extubated to 3L [place] by RT 11/3>>> Transesophageal Echo 11/4: Left-sided chest tube Significant Diagnostic Tests:  11/3 CTA FINDINGS: Cardiovascular: Satisfactory opacification the pulmonary arteries to the segmental level. Segmental and subsegmental pulmonary artery emboli are noted throughout the left lower lobe, lingula and with the lobar, segmental and subsegmental arteries of the right upper lobe. Suspect more extensive embolic burden however given the numerous cavitary lesions throughout the lungs compatible with septic emboli. Flattening of the intraventricular septum and mild elevation of the RV LV ratio (0.95) concerning for right heart strain. Cardiac size remains within normal limits. Few coronary artery calcifications are present. No sizable pericardial effusion though difficult to discern given increased attenuation of the mediastinal fat.  Mediastinum/Nodes: Diffusely increased attenuation of the mediastinal fat suggesting soft tissue edema. Prominent mediastinal and hilar adenopathy, likely reactive. No acute abnormality of the trachea or esophagus. Thyroid gland and thoracic inlet are unremarkable.  Lungs/Pleura: There are numerous rounded and cavitary fluid-filled lesions throughout the lungs suspicious  for septic pulmonary emboli. Some interspersed regions of ground-glass attenuation and tree-in-bud nodularity noted in the left lung base. There is irregular visceral pleural thickening involving the left lung with a large left hydropneumothorax with dependently layering simple attenuation fluid. Small right pleural effusion present as well with adjacent passive atelectasis.  Upper Abdomen: Nodular hepatic surface contour. Moderate volume upper abdominal ascites.  Musculoskeletal: Nonunited subacute to remote lateral left tenth rib fracture.No acute osseous abnormality or suspicious osseous lesion. Paucity of subcutaneous fat. Body wall edema.    IMPRESSION: 1. Features of widespread septic emboli with more central pulmonary arterial filling defects seen within the left lower lobe, positive for acute PE with CT evidence of right heart strain (RV/LV Ratio = 0.95) consistent with at least submassive (intermediate risk) PE. 2. No visible pneumomediastinum. 3. Irregular visceral pleural thickening involving the left lung with a large left hydropneumothorax, concerning for empyema. 4. Small right pleural effusion and adjacent passive atelectasis. 5. Nodular hepatic surface contour, consistent with cirrhosis. Moderate volume upper abdominal ascites. 6. Additional features of anasarca diffusely increased attenuation of the mediastinal fat and circumferential body wall edema. 7. Aortic Atherosclerosis   11/3 CXR FINDINGS: New loculated left pneumothorax in the setting of cavitary lung lesions. Moderately large pneumothorax and areas of collapse/consolidation in the left lung base also with sub pulmonic lucency and deep sulcus sign. Concomitant left pleural effusion is suspected. Cavitary lesions in the right chest are unchanged. Mild rotation with probable slight mediastinal shift. Post extubation with left IJ central venous catheter terminating at the caval to atrial junction as  before. No acute bone process.  MR LUMBAR AND THORACIC SPINE W W/O CONTRAST IMPRESSION: 1. Abnormal enhancement of the L4 and L5 vertebral bodies and disc space, consistent with discitis osteomyelitis. There is severe bilateral foraminal stenosis at the L4-5 level with abnormal contrast enhancement extending into both neural foramina. 2. Bilateral psoas muscle abscesses, measuring up to 2.6 cm on the right and 2.5 cm on the left. Paraspinal soft tissue inflammation at L4-5 with fluid in both L4-5 facet joints, which may be reactive or indicative of septic arthritis. 3. Abnormal signal and enhancement of the T1 vertebral body with surrounding paraspinal soft tissue enhancement, possibly indicating osteomyelitis. 4. Multiple cavitary lesions within both lungs, consistent with septic emboli.   10/28 US RENAL IMPRESSION: Mild hydronephrosis is noted. This may be related to the distended Bladder. Mild debris within the bladder which may be related to an underlying UTI. Clinical correlation is recommended. Increased echogenicity is noted consistent with medical renal disease.   Micro Data:  Bllood cx x 2 on 10/27>>> + MSSA Blood cx  On 10/28>>>>>>+ MSSA Abscess Rt Psoas>>MODERATE STAPHYLOCOCCUS AUREUS 10/28 resp cx on 10/28>>MODERATE STAPHYLOCOCCUS AUREUS  Repeat Blood cx on 10/30>>NGTD (4 days) Pleural fluid 11/4>>> Antimicrobials:  Abx course--Cefepime10/28 >>10/29 ,Vanc 10/28 >>10/30 Cefazolin 10/30>>   Subjective   C/o pleuritic and back pain   Objective   Blood pressure (Abnormal) 163/87, pulse (Abnormal) 103, temperature 97.8 F (36.6 C), temperature source Oral, resp. rate (Abnormal) 24, height 6' (1.829 m), weight 53.9 kg, SpO2 98 %.        Intake/Output Summary (Last 24 hours) at 03/02/2019 0934 Last data filed at 03/02/2019 0637 Gross per 24 hour  Intake 766.93 ml  Output 1380 ml  Net -613.07 ml   Filed Weights   02/28/19 1038 03/01/19 0428 03/02/19 0430   Weight:  63.1 kg 53.9 kg 53.9 kg    Examination:  General this is a cachectic chronically ill-appearing 48 year old male who is currently resting in bed continues to complain of generalized discomfort HEENT normocephalic atraumatic temporal wasting mucous membranes moist Pulmonary: Scattered rhonchi bilaterally.  Pleural rub on left.  1 out of 7 airleak from chest tube.   Cardiac: Regular rate and rhythm Abdomen: Soft nontender Extremity: Warm dry brisk cap refill Neuro: Awake oriented no focal deficits. 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 Diffuse airspace disease persists.   Plan Continue current antibiotics as we await culture data Continue chest tube 20 cm of suction Repeat chest x-ray in a.m. Eventually we may need to repeat CT imaging to reassess pleural space   Best practice:  Diet: keep NPO for possible procedure Pain/Anxiety/Delirium protocol (if indicated): for back pain per primary VAP protocol (if indicated): not intubated  DVT prophylaxis: per primary GI prophylaxis: per primary  Glucose control: per primary Mobility: bedrest Code Status: no shock no compressions or intubation Prognosis: poor. Strongly recommend Palliative consult Disposition: remains on SD with TRH     Critical care time:NA     Erick Colace ACNP-BC [place] Pager # [phone] OR # [phone] if no answer

## 2019-03-02 NOTE — Progress Notes (Addendum)
PROGRESS NOTE    Franklin Anderson  DEY:[id number]  DOB: 1971/04/13  DOA: 02/21/2019 PCP: Mack Hook, MD  Brief Narrative:  48 y.o. male with medical history significant of rheumatic fever, HTN, ongoing IV heroin abuse, presented to the ED on 10/27 with 3 week course of progressively worsening back pain and BLE weakness associated with some bowel incontinence and trouble walking. Last use of heroin was on the day of admission. ED Course: Found to have:sepsis with Tm 100, HR 126, RR 31, WBC 22.4k,AKI with BUN 111 and creat 1.7. Imaging studies revealed osteomyelitis and diskitis of L4-L5,septic pulmonary emboli and B psoas abscesses. Patient recieved 2L NS bolus, put on cefepime / vanc / flagyl initially, admitted to [facility] for Mx of sepsis and associated metabolic encephalopathy.2x Blood culture 10/27>> MSSA. Patient became more obtunded and dyspneic on 10/28 , PCCM consulted-intubated --extubated on 10/31. Patient underwent R psoas abscess aspiration on 10/28. Wound culture>> MSSA.  Respiratory culture from 10/28 also growing MSSA.  Echo revealed Grade 1 diastolic dysfunction, preserved EF and possible mitral valve vegetation. TEE recommended. CXR suggestive of septic emboli and MRI head showed abnormal dural enhancement. ID/Cardiology following. HC additionally complicated by hypernatremia, persistent uremia, pericardial effusion (did not look too bad on bedside US 10/29 per PCCM) and severe deconditioning. Abx course--Cefepime10/28 >> 10/29 ,Vanc  10/28 >> 10/30, Cefazolin 10/30>>now 11/3: Patient underwent TEE and had acute respiratory distress later that evening with stat chest x-ray showing loculated pneumothorax/CT chest indicating empyema/PE/hydropneumothorax.  Now seen by PCCM and post chest tube placement.Code status changed to DNR.   Subjective: Patient resting comfortably and reports feeling good.  Blood pressure somewhat improved today.  Chest tube to left posterior chest wall  noted.    Objective: Vitals:   03/02/19 0842 03/02/19 1000 03/02/19 1200 03/02/19 1400  BP:  (!) 155/101 (!) 159/98 (!) 145/101  Pulse:  (!) 110 (!) 106 91  Resp:  (!) _0 Temp:   (!) 97.5 F (36.4 C)   TempSrc:   Oral   SpO2: 98% 96% 98% 97%  Weight:      Height:        Intake/Output Summary (Last 24 hours) at 03/02/2019 1607 Last data filed at 03/02/2019 1500 Gross per 24 hour  Intake 865.44 ml  Output 1205 ml  Net -339.56 ml   Filed Weights   02/28/19 1038 03/01/19 0428 03/02/19 0430  Weight: 63.1 kg 53.9 kg 53.9 kg    Physical Examination:  General exam: Appears comfortable now and in no respiratory distress Respiratory system: Decreased breath sounds at bases, otherwise clear to auscultation. Respiratory effort normal.  Chest tube to left side with serosanguineous drainage Cardiovascular system: S1 & S2 heard,tachycardic. No JVD, murmurs, rubs, gallops or clicks. No pedal edema. Gastrointestinal system: Abdomen is nondistended, soft and nontender. No organomegaly or masses felt. Normal bowel sounds heard. Central nervous system: Alert and oriented. No focal neurological deficits. Extremities: Symmetric 5 x 5 power.  Puffy feet noted as he has elevation at knee level and feet dangling Skin: No rashes, lesions or ulcers Psychiatry: Judgement and insight appear normal. Mood & affect irritable due to n.p.o. status    Data Reviewed: I have personally reviewed following labs and imaging studies  CBC: Recent Labs  Lab 02/26/19 0222 02/27/19 0538 02/28/19 0432 02/28/19 0910 02/28/19 2023 03/01/19 0418 03/02/19 0500  WBC 11.1* 12.3* 9.4  --   --  10.3 7.9  HGB 7.5* 7.8* 7.1* 7.9* 9.4* 8.1* 7.7*  HCT 24.9* 25.3* 23.4* 25.6* 29.6* 26.5* 25.0*  MCV 92.6 92.0 92.5  --   --  91.1 90.9  PLT 243 252 182  --   --  189 403   Basic Metabolic Panel: Recent Labs  Lab 02/24/19 0415 02/25/19 0440 02/26/19 0222 02/26/19 0707 02/27/19 0538 02/28/19 0432  03/01/19 0418 03/02/19 0500  NA 141 148*  --  146* 141 138 140 138  K 3.4* 4.0  --  3.5 4.0 4.3 3.9 3.9  CL 108 113*  --  113* 109 107 107 105  CO2 26 27  --  _0 GLUCOSE 158* 138*  --  130* 107* 104* 131* 106*  BUN 88* 64*  --  69* 48* 34* 30* 27*  CREATININE 0.80 0.61  --  0.55* 0.53* 0.43* 0.48* 0.47*  CALCIUM 7.8* 8.1*  --  8.1* 8.1* 7.9* 7.7* 7.7*  MG 2.8* 2.5* 2.1  --  1.7  --   --   --   PHOS 3.6  --   --   --   --   --   --   --    GFR: Estimated Creatinine Clearance: 86.1 mL/min (A) (by C-G formula based on SCr of 0.47 mg/dL (L)). Liver Function Tests: Recent Labs  Lab 02/24/19 0415 02/25/19 0440  AST 26 23  ALT 39 28  ALKPHOS 88 81  BILITOT 0.8 0.6  PROT 6.2* 6.4*  ALBUMIN 1.5* 1.4*   No results for input(s): LIPASE, AMYLASE in the last 168 hours. No results for input(s): AMMONIA in the last 168 hours. Coagulation Profile: Recent Labs  Lab 02/28/19 2331  INR 1.4*   Cardiac Enzymes: No results for input(s): CKTOTAL, CKMB, CKMBINDEX, TROPONINI in the last 168 hours. BNP (last 3 results) No results for input(s): PROBNP in the last 8760 hours. HbA1C: Recent Labs    02/28/19 0910  HGBA1C 6.4*   CBG: Recent Labs  Lab 03/01/19 1132 03/01/19 1603 03/01/19 2113 03/02/19 0741 03/02/19 1135  GLUCAP 96 87 144* 85 138*   Lipid Profile: No results for input(s): CHOL, HDL, LDLCALC, TRIG, CHOLHDL, LDLDIRECT in the last 72 hours. Thyroid Function Tests: No results for input(s): TSH, T4TOTAL, FREET4, T3FREE, THYROIDAB in the last 72 hours. Anemia Panel: Recent Labs    02/28/19 0910  FERRITIN 685*  TIBC 133*  IRON 50   Sepsis Labs: No results for input(s): PROCALCITON, LATICACIDVEN in the last 168 hours.  Recent Results (from the past 240 hour(s))  Blood Culture (routine x 2)     Status: Abnormal   Collection Time: 02/21/19  8:13 PM   Specimen: BLOOD LEFT FOREARM  Result Value Ref Range Status   Specimen Description   Final    BLOOD LEFT  FOREARM Performed at [facility], [address], [place], [place] [address]    Special Requests   Final    BOTTLES DRAWN AEROBIC AND ANAEROBIC Blood Culture results may not be optimal due to an inadequate volume of blood received in culture bottles Performed at [place] [address] [place], [place] [address]    Culture  Setup Time   Final    GRAM POSITIVE COCCI IN BOTH AEROBIC AND ANAEROBIC BOTTLES CRITICAL RESULT CALLED TO, READ BACK BY AND VERIFIED WITH: PHARMD J LEGGE [id number] AT 1337 BY CM CRITICAL RESULT CALLED TO, READ BACK BY AND VERIFIED WITH: PHARMD E JACKSON [id number] AT 1357 BY CM Performed at [facility], [address] [place], [place] [address]    Culture STAPHYLOCOCCUS AUREUS (A)  Final   Report Status 02/24/2019 FINAL  Final   Organism ID, Bacteria STAPHYLOCOCCUS AUREUS  Final      Susceptibility   Staphylococcus aureus - MIC*    CIPROFLOXACIN <=0.5 SENSITIVE Sensitive     ERYTHROMYCIN >=8 RESISTANT Resistant     GENTAMICIN <=0.5 SENSITIVE Sensitive     OXACILLIN 0.5 SENSITIVE Sensitive     TETRACYCLINE <=1 SENSITIVE Sensitive     VANCOMYCIN 1 SENSITIVE Sensitive     TRIMETH/SULFA <=10 SENSITIVE Sensitive     CLINDAMYCIN <=0.25 SENSITIVE Sensitive     RIFAMPIN <=0.5 SENSITIVE Sensitive     Inducible Clindamycin NEGATIVE Sensitive     * STAPHYLOCOCCUS AUREUS  SARS Coronavirus 2 by RT PCR (hospital order, performed in [facility] hospital lab) Nasopharyngeal Nasopharyngeal Swab     Status: None   Collection Time: 02/21/19  8:16 PM   Specimen: Nasopharyngeal Swab  Result Value Ref Range Status   SARS Coronavirus 2 NEGATIVE NEGATIVE Final    Comment: (NOTE) If result is NEGATIVE SARS-CoV-2 target nucleic acids are NOT DETECTED. The SARS-CoV-2 RNA is generally detectable in upper and lower  respiratory specimens during the acute phase of infection. The lowest  concentration of SARS-CoV-2 viral copies this assay can detect is  250  copies / mL. A negative result does not preclude SARS-CoV-2 infection  and should not be used as the sole basis for treatment or other  patient management decisions.  A negative result may occur with  improper specimen collection / handling, submission of specimen other  than nasopharyngeal swab, presence of viral mutation(s) within the  areas targeted by this assay, and inadequate number of viral copies  (<250 copies / mL). A negative result must be combined with clinical  observations, patient history, and epidemiological information. If result is POSITIVE SARS-CoV-2 target nucleic acids are DETECTED. The SARS-CoV-2 RNA is generally detectable in upper and lower  respiratory specimens dur ing the acute phase of infection.  Positive  results are indicative of active infection with SARS-CoV-2.  Clinical  correlation with patient history and other diagnostic information is  necessary to determine patient infection status.  Positive results do  not rule out bacterial infection or co-infection with other viruses. If result is PRESUMPTIVE POSTIVE SARS-CoV-2 nucleic acids MAY BE PRESENT.   A presumptive positive result was obtained on the submitted specimen  and confirmed on repeat testing.  While 2019 novel coronavirus  (SARS-CoV-2) nucleic acids may be present in the submitted sample  additional confirmatory testing may be necessary for epidemiological  and / or clinical management purposes  to differentiate between  SARS-CoV-2 and other Sarbecovirus currently known to infect humans.  If clinically indicated additional testing with an alternate test  methodology [phone]) is advised. The SARS-CoV-2 RNA is generally  detectable in upper and lower respiratory sp ecimens during the acute  phase of infection. The expected result is Negative. Fact Sheet for Patients:  StrictlyIdeas.no Fact Sheet for Healthcare  Providers: BankingDealers.co.za This test is not yet approved or cleared by the [place] FDA and has been authorized for detection and/or diagnosis of SARS-CoV-2 by FDA under an Emergency Use Authorization (EUA).  This EUA will remain in effect (meaning this test can be used) for the duration of the COVID-19 declaration under Section 564(b)(1) of the Act, 21 U.S.C. section 360bbb-3(b)(1), unless the authorization is terminated or revoked sooner. Performed at [facility], [address] [place], [place] [address]   MRSA PCR Screening     Status: None   Collection Time: 02/22/19  4:56 AM   Specimen: Nasal Mucosa; Nasopharyngeal  Result Value Ref Range Status   MRSA by PCR NEGATIVE NEGATIVE Final    Comment:        The GeneXpert MRSA Assay (FDA approved for NASAL specimens only), is one component of a comprehensive MRSA colonization surveillance program. It is not intended to diagnose MRSA infection nor to guide or monitor treatment for MRSA infections. Performed at [facility], [place] [address], [place], [place] [address]   Blood Culture (routine x 2)     Status: Abnormal   Collection Time: 02/22/19  5:33 AM   Specimen: BLOOD  Result Value Ref Range Status   Specimen Description   Final    BLOOD LEFT ANTECUBITAL Performed at [place] [address], [place], [place] [address]    Special Requests   Final    BOTTLES DRAWN AEROBIC AND ANAEROBIC Blood Culture adequate volume Performed at [place] [address], [place], [place] [address]    Culture  Setup Time   Final    GRAM POSITIVE COCCI ANAEROBIC BOTTLE ONLY CRITICAL RESULT CALLED TO, READ BACK BY AND VERIFIED WITH: PHARMD E JACKSON [id number] AT 1108 BY CM    Culture (A)  Final    STAPHYLOCOCCUS AUREUS SUSCEPTIBILITIES PERFORMED ON PREVIOUS CULTURE WITHIN THE LAST 5 DAYS. Performed at [facility],  [place] [address] [place], [place] [address]    Report Status 02/24/2019 FINAL  Final  Aerobic/Anaerobic Culture (surgical/deep wound)     Status: None   Collection Time: 02/22/19 10:05 AM   Specimen: Abscess  Result Value Ref Range Status   Specimen Description   Final    ABSCESS RT PSOAS Performed at [place] [address], [place], [place] [address]    Special Requests   Final    NONE Performed at [facility], [place] [address], [place], [place] [address]    Gram Stain   Final    RARE WBC PRESENT,BOTH PMN AND MONONUCLEAR FEW GRAM POSITIVE COCCI IN PAIRS    Culture   Final    MODERATE STAPHYLOCOCCUS AUREUS NO ANAEROBES ISOLATED Performed at [facility], [place] [address], [place], [place] [address]    Report Status 02/27/2019 FINAL  Final   Organism ID, Bacteria STAPHYLOCOCCUS AUREUS  Final      Susceptibility   Staphylococcus aureus - MIC*    CIPROFLOXACIN <=0.5 SENSITIVE Sensitive     ERYTHROMYCIN >=8 RESISTANT Resistant     GENTAMICIN <=0.5 SENSITIVE Sensitive     OXACILLIN 0.5 SENSITIVE Sensitive     TETRACYCLINE <=1 SENSITIVE Sensitive     VANCOMYCIN <=0.5 SENSITIVE Sensitive     TRIMETH/SULFA <=10 SENSITIVE Sensitive     CLINDAMYCIN <=0.25 SENSITIVE Sensitive     RIFAMPIN <=0.5 SENSITIVE Sensitive     Inducible Clindamycin NEGATIVE Sensitive     * MODERATE STAPHYLOCOCCUS AUREUS  Culture, respiratory (non-expectorated)     Status: None   Collection Time: 02/22/19  5:30 PM   Specimen: Bronchoalveolar Lavage; Respiratory  Result Value Ref Range Status   Specimen Description   Final    BRONCHIAL ALVEOLAR LAVAGE Performed at [place] [address] [place], [place] [address]    Special Requests   Final    NONE Performed at [facility],  [place] [address], [place], [place] [address]    Gram Stain   Final    ABUNDANT WBC PRESENT, PREDOMINANTLY PMN MODERATE GRAM POSITIVE  COCCI Performed at [facility], [place] [address] [place], [place] [address]    Culture MODERATE STAPHYLOCOCCUS AUREUS  Final   Report Status 02/25/2019 FINAL  Final   Organism ID, Bacteria STAPHYLOCOCCUS AUREUS  Final      Susceptibility   Staphylococcus aureus - MIC*    CIPROFLOXACIN <=0.5 SENSITIVE Sensitive     ERYTHROMYCIN >=8 RESISTANT Resistant     GENTAMICIN <=0.5 SENSITIVE Sensitive     OXACILLIN 0.5 SENSITIVE Sensitive     TETRACYCLINE <=1 SENSITIVE Sensitive     VANCOMYCIN 1 SENSITIVE Sensitive     TRIMETH/SULFA <=10 SENSITIVE Sensitive     CLINDAMYCIN <=0.25 SENSITIVE Sensitive     RIFAMPIN <=0.5 SENSITIVE Sensitive     Inducible Clindamycin NEGATIVE Sensitive     * MODERATE STAPHYLOCOCCUS AUREUS  Culture, blood (Routine X 2) w Reflex to ID Panel     Status: None   Collection Time: 02/24/19 11:47 AM   Specimen: BLOOD LEFT HAND  Result Value Ref Range Status   Specimen Description   Final    BLOOD LEFT HAND Performed at [place] [address] [place], [place] [address]    Special Requests   Final    BOTTLES DRAWN AEROBIC AND ANAEROBIC Blood Culture adequate volume Performed at [place] [address] [place], [place] [address]    Culture   Final    NO GROWTH 5 DAYS Performed at [facility], [place] [address] [place], [place] [address]    Report Status 03/01/2019 FINAL  Final  Body fluid culture     Status: None (Preliminary result)   Collection Time: 03/01/19  1:30 PM   Specimen: Chest; Body Fluid  Result Value Ref Range Status   Specimen Description   Final    CHEST Performed at [place] [address] [place], [place] [address]    Special Requests   Final    Normal Performed at [facility], [place] [address] [place], [place] [address]    Gram Stain   Final    MODERATE WBC PRESENT, PREDOMINANTLY PMN FEW GRAM POSITIVE COCCI IN CLUSTERS    Culture    Final    CULTURE REINCUBATED FOR BETTER GROWTH Performed at [facility], [place] [address], [place], [place] [address]    Report Status PENDING  Incomplete      Radiology Studies: Ct Angio Chest Pe W Or Wo Contrast  Addendum Date: 02/28/2019   ADDENDUM REPORT: 02/28/2019 22:19 ADDENDUM: Minimal mediastinal shift. Results and addendum were called by telephone at the time of interpretation on 02/28/2019 at 10:19 pm to provider NP Baltazar Najjar , who verbally acknowledged these results. Electronically Signed   By: Lovena Le M.D.   On: 02/28/2019 22:19   Result Date: 02/28/2019 CLINICAL DATA:  Increasing shortness of breath post transesophageal ECHO EXAM: CT ANGIOGRAPHY CHEST WITH CONTRAST TECHNIQUE: Multidetector CT imaging of the chest was performed using the standard protocol during bolus administration of intravenous contrast. Multiplanar CT image reconstructions and MIPs were obtained to evaluate the vascular anatomy. CONTRAST:  180m OMNIPAQUE IOHEXOL 350 MG/ML SOLN COMPARISON:  Same-day radiograph FINDINGS: Cardiovascular: Satisfactory opacification the pulmonary arteries to the segmental level. Segmental and subsegmental pulmonary artery emboli are noted throughout the left lower lobe, lingula and with the  lobar, segmental and subsegmental arteries of the right upper lobe. Suspect more extensive embolic burden however given the numerous cavitary lesions throughout the lungs compatible with septic emboli. Flattening of the intraventricular septum and mild elevation of the RV LV ratio (0.95) concerning for right heart strain. Cardiac size remains within normal limits. Few coronary artery calcifications are present. No sizable pericardial effusion though difficult to discern given increased attenuation of the mediastinal fat. Mediastinum/Nodes: Diffusely increased attenuation of the mediastinal fat suggesting soft tissue edema. Prominent mediastinal and hilar adenopathy, likely reactive. No acute  abnormality of the trachea or esophagus. Thyroid gland and thoracic inlet are unremarkable. Lungs/Pleura: There are numerous rounded and cavitary fluid-filled lesions throughout the lungs suspicious for septic pulmonary emboli. Some interspersed regions of ground-glass attenuation and tree-in-bud nodularity noted in the left lung base. There is irregular visceral pleural thickening involving the left lung with a large left hydropneumothorax with dependently layering simple attenuation fluid. Small right pleural effusion present as well with adjacent passive atelectasis. Upper Abdomen: Nodular hepatic surface contour. Moderate volume upper abdominal ascites. Musculoskeletal: Nonunited subacute to remote lateral left tenth rib fracture.No acute osseous abnormality or suspicious osseous lesion. Paucity of subcutaneous fat. Body wall edema. Review of the MIP images confirms the above findings. IMPRESSION: 1. Features of widespread septic emboli with more central pulmonary arterial filling defects seen within the left lower lobe, positive for acute PE with CT evidence of right heart strain (RV/LV Ratio = 0.95) consistent with at least submassive (intermediate risk) PE. The presence of right heart strain has been associated with an increased risk of morbidity and mortality. Please activate Code PE by paging [phone]. 2. No visible pneumomediastinum. 3. Irregular visceral pleural thickening involving the left lung with a large left hydropneumothorax, concerning for empyema. 4. Small right pleural effusion and adjacent passive atelectasis. 5. Nodular hepatic surface contour, consistent with cirrhosis. Moderate volume upper abdominal ascites. 6. Additional features of anasarca diffusely increased attenuation of the mediastinal fat and circumferential body wall edema. 7. Aortic Atherosclerosis (ICD10-I70.0). Currently awaiting provider callback. Addendum will be submitted following contact with the ordering provider or  care team. Electronically Signed: By: Lovena Le M.D. On: 02/28/2019 22:11   Dg Chest Port 1 View  Result Date: 03/02/2019 CLINICAL DATA:  Follow-up pleural effusion. EXAM: PORTABLE CHEST 1 VIEW COMPARISON:  Chest x-ray 03/01/2019 and chest CT 02/28/2019 FINDINGS: The cardiac silhouette, mediastinal and hilar contours are stable. Stable left IJ central venous catheter. The left-sided pleural drainage catheter is in stable position. Interval decrease in size of the left-sided pneumothorax. Stable numerous cavitary lesions in both lungs consistent with septic emboli. IMPRESSION: Left chest tube in good position with interval decrease in size of the left-sided pneumothorax. Stable bilateral septic emboli and infiltrates. Electronically Signed   By: Marijo Sanes M.D.   On: 03/02/2019 08:23   Dg Chest Port 1 View  Result Date: 03/01/2019 CLINICAL DATA:  48 year old male with a history of chest tube placement EXAM: PORTABLE CHEST 1 VIEW COMPARISON:  CT chest 02/28/2019, plain film 02/28/2019 FINDINGS: Cardiomediastinal silhouette is unchanged in size and contour. Unchanged left IJ central venous catheter with the tip appearing to terminate superior cavoatrial junction. Interval placement thoracostomy tube the left lung base. Components of pneumothorax in the upper lung persist with incomplete re-expansion of the lung. Opacity within the left mid and lower lung persists obscuring the left hemidiaphragm in the left heart border. Nodular opacities of the right lung persist. No right pneumothorax or right pleural  effusion. IMPRESSION: Interval placement of left-sided thoracostomy tube at the lung base with incomplete re-expansion of the left lung and persisting pneumothorax/hydropneumothorax. Similar appearance of the lungs with multifocal septic emboli and consolidation/scarring at the left lung base. Unchanged left IJ central venous catheter. Electronically Signed   By: Corrie Mckusick D.O.   On: 03/01/2019 12:43    Dg Chest Port 1 View  Addendum Date: 02/28/2019   ADDENDUM REPORT: 02/28/2019 19:27 ADDENDUM: Critical Value/emergent results were called by telephone at the time of interpretation on 02/28/2019 at 7:27 pm to providerKaren Baltazar Najjar, who verbally acknowledged these results. Electronically Signed   By: Zetta Bills M.D.   On: 02/28/2019 19:27   Result Date: 02/28/2019 CLINICAL DATA:  Elevated heart rate and dyspnea. EXAM: PORTABLE CHEST 1 VIEW COMPARISON:  02/24/2019 FINDINGS: New loculated left pneumothorax in the setting of cavitary lung lesions. Moderately large pneumothorax and areas of collapse/consolidation in the left lung base also with sub pulmonic lucency and deep sulcus sign. Concomitant left pleural effusion is suspected. Cavitary lesions in the right chest are unchanged. Mild rotation with probable slight mediastinal shift. Post extubation with left IJ central venous catheter terminating at the caval to atrial junction as before. No acute bone process. IMPRESSION: 1. New loculated left pneumothorax in the setting of cavitary lung lesions. Mediastinal shift while slight is present on the current study. 2. Stable cavitary lesions in the right chest. 3. Post extubation. 4. A call is out to the referring provider to further discuss above findings. Electronically Signed: By: Zetta Bills M.D. On: 02/28/2019 19:19   Vas [place] Lower Extremity Venous (dvt)  Result Date: 03/01/2019  Lower Venous Study Indications: Pulmonary embolism.  Risk Factors: None identified. Comparison Study: No prior studies. Performing Technologist: Oliver Hum RVT  Examination Guidelines: A complete evaluation includes B-mode imaging, spectral Doppler, color Doppler, and power Doppler as needed of all accessible portions of each vessel. Bilateral testing is considered an integral part of a complete examination. Limited examinations for reoccurring indications may be performed as noted. The reflux portion of the exam is  performed with the patient in reverse Trendelenburg.  +---------+---------------+---------+-----------+----------+--------------+  RIGHT     Compressibility Phasicity Spontaneity Properties Thrombus Aging  +---------+---------------+---------+-----------+----------+--------------+  CFV       Full            Yes       Yes                                    +---------+---------------+---------+-----------+----------+--------------+  SFJ       Full                                                             +---------+---------------+---------+-----------+----------+--------------+  FV Prox   Full                                                             +---------+---------------+---------+-----------+----------+--------------+  FV Mid    Full                                                             +---------+---------------+---------+-----------+----------+--------------+  FV Distal Full                                                             +---------+---------------+---------+-----------+----------+--------------+  PFV       Full                                                             +---------+---------------+---------+-----------+----------+--------------+  POP       Full            Yes       Yes                                    +---------+---------------+---------+-----------+----------+--------------+  PTV       Full                                                             +---------+---------------+---------+-----------+----------+--------------+  PERO      Full                                                             +---------+---------------+---------+-----------+----------+--------------+   +---------+---------------+---------+-----------+----------+--------------+  LEFT      Compressibility Phasicity Spontaneity Properties Thrombus Aging  +---------+---------------+---------+-----------+----------+--------------+  CFV       Full            Yes       Yes                                     +---------+---------------+---------+-----------+----------+--------------+  SFJ       Full                                                             +---------+---------------+---------+-----------+----------+--------------+  FV Prox   Full                                                             +---------+---------------+---------+-----------+----------+--------------+  FV Mid    Full                                                             +---------+---------------+---------+-----------+----------+--------------+  FV Distal Full                                                             +---------+---------------+---------+-----------+----------+--------------+  PFV       Full                                                             +---------+---------------+---------+-----------+----------+--------------+  POP       Full            Yes       Yes                                    +---------+---------------+---------+-----------+----------+--------------+  PTV       Full                                                             +---------+---------------+---------+-----------+----------+--------------+  PERO      Full                                                             +---------+---------------+---------+-----------+----------+--------------+     Summary: Right: No evidence of common femoral vein obstruction. There is no evidence of deep vein thrombosis in the lower extremity. No cystic structure found in the popliteal fossa. Left: There is no evidence of deep vein thrombosis in the lower extremity. No cystic structure found in the popliteal fossa.  *See table(s) above for measurements and observations. Electronically signed by Curt Jews MD on 03/01/2019 at 6:27:25 PM.    Final         Scheduled Meds:  amLODipine  10 mg Oral Daily   Chlorhexidine Gluconate Cloth  6 each Topical Daily   feeding supplement (ENSURE ENLIVE)  237 mL Oral BID BM   insulin aspart  0-24  Units Subcutaneous TID AC & HS   ketorolac  15 mg Intravenous Q8H   mouth rinse  15 mL Mouth Rinse BID   metoprolol tartrate  25 mg Oral BID   multivitamin with minerals  1 tablet Oral Daily   pantoprazole  40 mg Oral Daily   sodium chloride flush  10-40 mL Intracatheter Q12H   Continuous Infusions:   ceFAZolin (ANCEF) IV Stopped (03/02/19 1335)   sodium chloride      Assessment & Plan:    1. Acute hypoxic respiratory failure/ respiratory distress on 11/3 with CT findings of Empyema: Patient had AMS, dyspnea, increased secretions on presentation needing intubation-subsequently extubated and had been saturating well on 3 L nasal cannula.Respiratory cultures grew MSSA.Chest x-ray reported multiple pulmonary cavities/septic emboli with left lower lobe pneumonia and small pleural  effusion. Patient underwent TEE on 11/3 at [facility], noted to be tachypneic few hours after returning but saturating 99% on 3 lits. Stat CXR was obtained and after d/w cardiology CT chest was also ordered. CXR showed loculated pneumothorax with mediastinal shift/rotation. PCCM was re-consulted--CT chest later reported "Features of widespread septic emboli with more central pulmonary arterial filling defects seen within the left lower lobe, positive for acute PE with CT evidence of right heart strain.No visible pneumomediastinum. Irregular visceral pleural thickening involving the left lung with a large left hydropneumothorax, concerning for empyema". PCCM recommended IR assisted drainage--IR recommended CT surgery eval --this morning patient underwent chest tube/ drainage of empyema by PCCM--sent for cultures. Seen by ID who recommended to continue current abx. Cardiology recommended discontinuing heparin drip that was started for PE as likely representative of septic emboli. PCCM agrees.   2.MSSA bacteremia/sepsis with discitis/vertebral osteomyelitis/psoas abscesses/ septic pulmonary emboli/cavitatory lesions and  now with empyema/ RV mural vegatation: in the setting of IV drug abuse. Blood cultures/psoas cultures and respiratory cultures growing MSSA.  Repeat blood cultures from 10/30 x 1 negative so far.Continue IV cefazolin.  Leukocytosis 12-13 K.  Afebrile.  Will plan on PICC line once cleared by ID. PCCM recommends discontinuing central line as soon as possible. Patient has poor peripheral IV access due to drug abuse history.     3.  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  Cultures growing MSSA.  Continue antibiotics as above.  Patient been c/o back pain and asking to increase pain medications for better control. Given elevated BP and tachycardia, will escalate  4.  Hypernatremia: Now improved sodium levels with IV hydration.  Eating and drinking well.    5.  AKI, uremia: Much improved with IV hydration.  BUN peaked to 118 during this hospital course and now down to 40s.  Mental status improved.    6.  Pericardial effusion: Not very concerning on bedside ultrasonogram per PCCM.  Reported to be mild-moderate on TEE  without any signs of tamponade.  Not sure if related to uremic pericarditis versus reactive to infection. Off IVF now  7.Metabolic encephalopathy: Present on admission in the setting of problem #1, dehydration/AKI/uremia and IV drug use. Improved.  Awake alert oriented x3 now.  MRI head showed abnormal dural enhancement.  Patient could have had meningoencephalitis on presentation.    8.Uncontrolled hypertension: Increase Norvasc and added Metoprolol 89m bid, titrate as tolerated. Continue hydralazine PRN.  Patient had labetalol as needed available.  9.  Normocytic anemia: Could be anemia of chronic disease versus hemolytic anemia in the setting of endocarditis.  Schistocytes on peripheral smear.  Off rectal tube now,no melena..Iron studies ordered. Post transfusion hemoglobin improved transiently to 9 but downtrending again now.  Monitor and transfuse as needed.  10.  Severe protein calorie malnutrition/cachexia/hypoalbuminemia: Albumin level less than 2 and cachectic in the setting of severe systemic infection/chronic IV drug abuse.  Low albumin level likely causing puffy feet and effusions.  Support stockings and protein supplements  11.  Generalized muscle weakness/deconditioning: PT evaluation  DVT prophylaxis: Heparin subcu Code Status: DNR now --Patient understands grave prognosis but states God saved him for a reason and is hopeful of recovery.  Family / Patient Communication: Discussed with patient /PCCM.  Will consult palliative in am--transfer to stepdown status today Disposition Plan: TBD.      LOS: 8 days    Time spent: 35 minutes    Savina Olshefski,  MD [facility] Pager [phone]  If 7PM-7AM, please contact night-coverage [email or web address] Password [email or web address] 03/02/2019, 4:07 PM

## 2019-03-02 NOTE — Progress Notes (Signed)
Subjective:  Still complaining of back pain and also pain at chest tube insertion site, central line site  Antibiotics:  Anti-infectives (From admission, onward)   Start     Dose/Rate Route Frequency Ordered Stop   02/24/19 2200  vancomycin (VANCOCIN) IVPB 1000 mg/200 mL premix  Status:  Discontinued     1,000 mg 200 mL/hr over 60 Minutes Intravenous Every 12 hours 02/24/19 1010 02/24/19 1253   02/24/19 1600  ceFAZolin (ANCEF) IVPB 2g/100 mL premix     2 g 200 mL/hr over 30 Minutes Intravenous Every 8 hours 02/24/19 1253     02/23/19 2200  vancomycin (VANCOCIN) IVPB 750 mg/150 ml premix  Status:  Discontinued     750 mg 150 mL/hr over 60 Minutes Intravenous Every 24 hours 02/23/19 0853 02/23/19 0909   02/23/19 2200  vancomycin (VANCOCIN) IVPB 750 mg/150 ml premix  Status:  Discontinued     750 mg 150 mL/hr over 60 Minutes Intravenous Every 12 hours 02/23/19 0909 02/24/19 1010   02/22/19 2200  vancomycin (VANCOCIN) IVPB 750 mg/150 ml premix  Status:  Discontinued     750 mg 150 mL/hr over 60 Minutes Intravenous Every 24 hours 02/22/19 0826 02/22/19 0827   02/22/19 2200  vancomycin (VANCOCIN) IVPB 1000 mg/200 mL premix  Status:  Discontinued     1,000 mg 200 mL/hr over 60 Minutes Intravenous Every 24 hours 02/22/19 0827 02/23/19 0853   02/22/19 0900  ceFEPIme (MAXIPIME) 2 g in sodium chloride 0.9 % 100 mL IVPB  Status:  Discontinued     2 g 200 mL/hr over 30 Minutes Intravenous Every 12 hours 02/22/19 0826 02/23/19 1135   02/21/19 2015  ceFEPIme (MAXIPIME) 2 g in sodium chloride 0.9 % 100 mL IVPB     2 g 200 mL/hr over 30 Minutes Intravenous  Once 02/21/19 2013 02/22/19 0031   02/21/19 2015  metroNIDAZOLE (FLAGYL) IVPB 500 mg     500 mg 100 mL/hr over 60 Minutes Intravenous  Once 02/21/19 2013 02/22/19 0031   02/21/19 2015  vancomycin (VANCOCIN) IVPB 1000 mg/200 mL premix     1,000 mg 200 mL/hr over 60 Minutes Intravenous  Once 02/21/19 2013 02/22/19 0200       Medications: Scheduled Meds:  amLODipine  10 mg Oral Daily   Chlorhexidine Gluconate Cloth  6 each Topical Daily   feeding supplement (ENSURE ENLIVE)  237 mL Oral BID BM   insulin aspart  0-24 Units Subcutaneous TID AC & HS   ketorolac  15 mg Intravenous Q8H   mouth rinse  15 mL Mouth Rinse BID   metoprolol tartrate  25 mg Oral BID   multivitamin with minerals  1 tablet Oral Daily   pantoprazole  40 mg Oral Daily   sodium chloride flush  10-40 mL Intracatheter Q12H   Continuous Infusions:   ceFAZolin (ANCEF) IV Stopped (03/02/19 0556)   sodium chloride     PRN Meds:.acetaminophen **OR** acetaminophen, acetaminophen, clonazePAM, hydrALAZINE, HYDROmorphone (DILAUDID) injection, ipratropium-albuterol, ondansetron **OR** ondansetron (ZOFRAN) IV, oxyCODONE-acetaminophen, senna-docusate, sodium chloride flush    Objective: Weight change: -9.2 kg  Intake/Output Summary (Last 24 hours) at 03/02/2019 0924 Last data filed at 03/02/2019 1610 Gross per 24 hour  Intake 766.93 ml  Output 1380 ml  Net -613.07 ml   Blood pressure (!) 163/87, pulse (!) 103, temperature 97.8 F (36.6 C), temperature source Oral, resp. rate (!) 24, height 6' (1.829 m), weight 53.9 kg, SpO2 98 %. Temp:  [97.4  F (36.3 C)-98.3 F (36.8 C)] 97.8 F (36.6 C) (11/05 0800) Pulse Rate:  [91-111] 103 (11/05 0800) Resp:  [18-30] 24 (11/05 0800) BP: (136-170)/(87-113) 163/87 (11/05 0800) SpO2:  [94 %-98 %] 98 % (11/05 0842) Weight:  [53.9 kg] 53.9 kg (11/05 0430)  Physical Exam: General: Alert and awake, oriented x3,cachectic, facial wasting HEENT: anicteric sclera, EOMI CVStach rate, normal  No mgr Chest: , no wheezing,fairly  clear anteiorly , CT in place with bloody material, purulent material Abdomen: soft non-distended,  Extremities: 3+ edema in LE Skin: no rashes Neuro: nonfocal  CBC:    BMET Recent Labs    03/01/19 0418 03/02/19 0500  NA 140 138  K 3.9 3.9  CL 107 105  CO2  26 25  GLUCOSE 131* 106*  BUN 30* 27*  CREATININE 0.48* 0.47*  CALCIUM 7.7* 7.7*     Liver Panel  No results for input(s): PROT, ALBUMIN, AST, ALT, ALKPHOS, BILITOT, BILIDIR, IBILI in the last 72 hours.     Sedimentation Rate No results for input(s): ESRSEDRATE in the last 72 hours. C-Reactive Protein No results for input(s): CRP in the last 72 hours.  Micro Results: Recent Results (from the past 720 hour(s))  Blood Culture (routine x 2)     Status: Abnormal   Collection Time: 02/21/19  8:13 PM   Specimen: BLOOD LEFT FOREARM  Result Value Ref Range Status   Specimen Description   Final    BLOOD LEFT FOREARM Performed at [facility], [address], [place], [place] [address]    Special Requests   Final    BOTTLES DRAWN AEROBIC AND ANAEROBIC Blood Culture results may not be optimal due to an inadequate volume of blood received in culture bottles Performed at [facility] [address] [place], [place] [address]    Culture  Setup Time   Final    GRAM POSITIVE COCCI IN BOTH AEROBIC AND ANAEROBIC BOTTLES CRITICAL RESULT CALLED TO, READ BACK BY AND VERIFIED WITH: PHARMD J LEGGE [id number] AT 1337 BY CM CRITICAL RESULT CALLED TO, READ BACK BY AND VERIFIED WITH: PHARMD E JACKSON [id number] AT 1357 BY CM Performed at [facility], [place] [address], [place], [place] [address]    Culture STAPHYLOCOCCUS AUREUS (A)  Final   Report Status 02/24/2019 FINAL  Final   Organism ID, Bacteria STAPHYLOCOCCUS AUREUS  Final      Susceptibility   Staphylococcus aureus - MIC*    CIPROFLOXACIN <=0.5 SENSITIVE Sensitive     ERYTHROMYCIN >=8 RESISTANT Resistant     GENTAMICIN <=0.5 SENSITIVE Sensitive     OXACILLIN 0.5 SENSITIVE Sensitive     TETRACYCLINE <=1 SENSITIVE Sensitive     VANCOMYCIN 1 SENSITIVE Sensitive     TRIMETH/SULFA <=10 SENSITIVE Sensitive     CLINDAMYCIN <=0.25 SENSITIVE Sensitive     RIFAMPIN <=0.5 SENSITIVE Sensitive     Inducible Clindamycin  NEGATIVE Sensitive     * STAPHYLOCOCCUS AUREUS  SARS Coronavirus 2 by RT PCR (hospital order, performed in [facility] hospital lab) Nasopharyngeal Nasopharyngeal Swab     Status: None   Collection Time: 02/21/19  8:16 PM   Specimen: Nasopharyngeal Swab  Result Value Ref Range Status   SARS Coronavirus 2 NEGATIVE NEGATIVE Final    Comment: (NOTE) If result is NEGATIVE SARS-CoV-2 target nucleic acids are NOT DETECTED. The SARS-CoV-2 RNA is generally detectable in upper and lower  respiratory specimens during the acute phase of infection. The lowest  concentration of  SARS-CoV-2 viral copies this assay can detect is 250  copies / mL. A negative result does not preclude SARS-CoV-2 infection  and should not be used as the sole basis for treatment or other  patient management decisions.  A negative result may occur with  improper specimen collection / handling, submission of specimen other  than nasopharyngeal swab, presence of viral mutation(s) within the  areas targeted by this assay, and inadequate number of viral copies  (<250 copies / mL). A negative result must be combined with clinical  observations, patient history, and epidemiological information. If result is POSITIVE SARS-CoV-2 target nucleic acids are DETECTED. The SARS-CoV-2 RNA is generally detectable in upper and lower  respiratory specimens dur ing the acute phase of infection.  Positive  results are indicative of active infection with SARS-CoV-2.  Clinical  correlation with patient history and other diagnostic information is  necessary to determine patient infection status.  Positive results do  not rule out bacterial infection or co-infection with other viruses. If result is PRESUMPTIVE POSTIVE SARS-CoV-2 nucleic acids MAY BE PRESENT.   A presumptive positive result was obtained on the submitted specimen  and confirmed on repeat testing.  While 2019 novel coronavirus  (SARS-CoV-2) nucleic acids may be present in the  submitted sample  additional confirmatory testing may be necessary for epidemiological  and / or clinical management purposes  to differentiate between  SARS-CoV-2 and other Sarbecovirus currently known to infect humans.  If clinically indicated additional testing with an alternate test  methodology [id number]) is advised. The SARS-CoV-2 RNA is generally  detectable in upper and lower respiratory sp ecimens during the acute  phase of infection. The expected result is Negative. Fact Sheet for Patients:  StrictlyIdeas.no Fact Sheet for Healthcare Providers: BankingDealers.co.za This test is not yet approved or cleared by the [place] FDA and has been authorized for detection and/or diagnosis of SARS-CoV-2 by FDA under an Emergency Use Authorization (EUA).  This EUA will remain in effect (meaning this test can be used) for the duration of the COVID-19 declaration under Section 564(b)(1) of the Act, 21 U.S.C. section 360bbb-3(b)(1), unless the authorization is terminated or revoked sooner. Performed at [facility], [place] [address], [place], [place] [address]   MRSA PCR Screening     Status: None   Collection Time: 02/22/19  4:56 AM   Specimen: Nasal Mucosa; Nasopharyngeal  Result Value Ref Range Status   MRSA by PCR NEGATIVE NEGATIVE Final    Comment:        The GeneXpert MRSA Assay (FDA approved for NASAL specimens only), is one component of a comprehensive MRSA colonization surveillance program. It is not intended to diagnose MRSA infection nor to guide or monitor treatment for MRSA infections. Performed at [facility], [place] [address], [place], [place] [address]   Blood Culture (routine x 2)     Status: Abnormal   Collection Time: 02/22/19  5:33 AM   Specimen: BLOOD  Result Value Ref Range Status   Specimen Description   Final    BLOOD LEFT ANTECUBITAL Performed at [facility] [address] [place], [place] [address]    Special Requests   Final    BOTTLES DRAWN AEROBIC AND ANAEROBIC Blood Culture adequate volume Performed at [facility] [address] [place], [place] [address]    Culture  Setup Time   Final    GRAM POSITIVE COCCI ANAEROBIC BOTTLE ONLY CRITICAL RESULT CALLED TO, READ BACK  BY AND VERIFIED WITH: PHARMD E JACKSON [id number] AT 1108 BY CM    Culture (A)  Final    STAPHYLOCOCCUS AUREUS SUSCEPTIBILITIES PERFORMED ON PREVIOUS CULTURE WITHIN THE LAST 5 DAYS. Performed at [facility], [place] [address] [place], [place] [address]    Report Status 02/24/2019 FINAL  Final  Aerobic/Anaerobic Culture (surgical/deep wound)     Status: None   Collection Time: 02/22/19 10:05 AM   Specimen: Abscess  Result Value Ref Range Status   Specimen Description   Final    ABSCESS RT PSOAS Performed at [facility] [address] [place], [place] [address]    Special Requests   Final    NONE Performed at [facility], [place] [address] [place], [place] [address]    Gram Stain   Final    RARE WBC PRESENT,BOTH PMN AND MONONUCLEAR FEW GRAM POSITIVE COCCI IN PAIRS    Culture   Final    MODERATE STAPHYLOCOCCUS AUREUS NO ANAEROBES ISOLATED Performed at [facility], [place] [address] [place], [place] [address]    Report Status 02/27/2019 FINAL  Final   Organism ID, Bacteria STAPHYLOCOCCUS AUREUS  Final      Susceptibility   Staphylococcus aureus - MIC*    CIPROFLOXACIN <=0.5 SENSITIVE Sensitive     ERYTHROMYCIN >=8 RESISTANT Resistant     GENTAMICIN <=0.5 SENSITIVE Sensitive     OXACILLIN 0.5 SENSITIVE Sensitive     TETRACYCLINE <=1 SENSITIVE Sensitive     VANCOMYCIN <=0.5 SENSITIVE Sensitive     TRIMETH/SULFA <=10 SENSITIVE Sensitive     CLINDAMYCIN <=0.25 SENSITIVE Sensitive     RIFAMPIN <=0.5 SENSITIVE Sensitive     Inducible Clindamycin NEGATIVE Sensitive     * MODERATE  STAPHYLOCOCCUS AUREUS  Culture, respiratory (non-expectorated)     Status: None   Collection Time: 02/22/19  5:30 PM   Specimen: Bronchoalveolar Lavage; Respiratory  Result Value Ref Range Status   Specimen Description   Final    BRONCHIAL ALVEOLAR LAVAGE Performed at [facility] [address] [place], [place] [address]    Special Requests   Final    NONE Performed at [facility], [place] [address], [place], [place] [address]    Gram Stain   Final    ABUNDANT WBC PRESENT, PREDOMINANTLY PMN MODERATE GRAM POSITIVE COCCI Performed at [facility], [place] [address] [place], [place] [address]    Culture MODERATE STAPHYLOCOCCUS AUREUS  Final   Report Status 02/25/2019 FINAL  Final   Organism ID, Bacteria STAPHYLOCOCCUS AUREUS  Final      Susceptibility   Staphylococcus aureus - MIC*    CIPROFLOXACIN <=0.5 SENSITIVE Sensitive     ERYTHROMYCIN >=8 RESISTANT Resistant     GENTAMICIN <=0.5 SENSITIVE Sensitive     OXACILLIN 0.5 SENSITIVE Sensitive     TETRACYCLINE <=1 SENSITIVE Sensitive     VANCOMYCIN 1 SENSITIVE Sensitive     TRIMETH/SULFA <=10 SENSITIVE Sensitive     CLINDAMYCIN <=0.25 SENSITIVE Sensitive     RIFAMPIN <=0.5 SENSITIVE Sensitive     Inducible Clindamycin NEGATIVE Sensitive     * MODERATE STAPHYLOCOCCUS AUREUS  Culture, blood (Routine X 2) w Reflex to ID Panel     Status: None   Collection Time: 02/24/19 11:47 AM   Specimen: BLOOD LEFT HAND  Result Value Ref Range Status   Specimen Description   Final    BLOOD LEFT HAND Performed at [facility], [address] [place], [place] [address]    Special Requests   Final    BOTTLES DRAWN AEROBIC AND ANAEROBIC Blood Culture adequate volume Performed at [facility] [address], [place], [place] [address]    Culture   Final    NO GROWTH 5 DAYS Performed at [facility], [place] [address], [place], [place] [address]    Report  Status 03/01/2019 FINAL  Final  Body fluid culture     Status: None (Preliminary result)   Collection Time: 03/01/19  1:30 PM   Specimen: Chest; Body Fluid  Result Value Ref Range Status   Specimen Description   Final    CHEST Performed at [facility] [address] [place], [place] [address]    Special Requests   Final    Normal Performed at [facility], [place] [address] [place], [place] [address]    Gram Stain   Final    MODERATE WBC PRESENT, PREDOMINANTLY PMN FEW GRAM POSITIVE COCCI IN CLUSTERS Performed at [facility], [place] [address], [place], [place] [address]    Culture PENDING  Incomplete   Report Status PENDING  Incomplete    Studies/Results: Ct Angio Chest Pe W Or Wo Contrast  Addendum Date: 02/28/2019   ADDENDUM REPORT: 02/28/2019 22:19 ADDENDUM: Minimal mediastinal shift. Results and addendum were called by telephone at the time of interpretation on 02/28/2019 at 10:19 pm to provider NP Baltazar Najjar , who verbally acknowledged these results. Electronically Signed   By: Lovena Le M.D.   On: 02/28/2019 22:19   Result Date: 02/28/2019 CLINICAL DATA:  Increasing shortness of breath post transesophageal ECHO EXAM: CT ANGIOGRAPHY CHEST WITH CONTRAST TECHNIQUE: Multidetector CT imaging of the chest was performed using the standard protocol during bolus administration of intravenous contrast. Multiplanar CT image reconstructions and MIPs were obtained to evaluate the vascular anatomy. CONTRAST:  142m OMNIPAQUE IOHEXOL 350 MG/ML SOLN COMPARISON:  Same-day radiograph FINDINGS: Cardiovascular: Satisfactory opacification the pulmonary arteries to the segmental level. Segmental and subsegmental pulmonary artery emboli are noted throughout the left lower lobe, lingula and with the lobar, segmental and subsegmental arteries of the right upper lobe. Suspect more extensive embolic burden however given the numerous cavitary lesions throughout the lungs  compatible with septic emboli. Flattening of the intraventricular septum and mild elevation of the RV LV ratio (0.95) concerning for right heart strain. Cardiac size remains within normal limits. Few coronary artery calcifications are present. No sizable pericardial effusion though difficult to discern given increased attenuation of the mediastinal fat. Mediastinum/Nodes: Diffusely increased attenuation of the mediastinal fat suggesting soft tissue edema. Prominent mediastinal and hilar adenopathy, likely reactive. No acute abnormality of the trachea or esophagus. Thyroid gland and thoracic inlet are unremarkable. Lungs/Pleura: There are numerous rounded and cavitary fluid-filled lesions throughout the lungs suspicious for septic pulmonary emboli. Some interspersed regions of ground-glass attenuation and tree-in-bud nodularity noted in the left lung base. There is irregular visceral pleural thickening involving the left lung with a large left hydropneumothorax with dependently layering simple attenuation fluid. Small right pleural effusion present as well with adjacent passive atelectasis. Upper Abdomen: Nodular hepatic surface contour. Moderate volume upper abdominal ascites. Musculoskeletal: Nonunited subacute to remote lateral left tenth rib fracture.No acute osseous abnormality or suspicious osseous lesion. Paucity of subcutaneous fat. Body wall edema. Review of the MIP images confirms the above findings. IMPRESSION: 1. Features of widespread septic emboli with more central pulmonary arterial filling defects seen within the  left lower lobe, positive for acute PE with CT evidence of right heart strain (RV/LV Ratio = 0.95) consistent with at least submassive (intermediate risk) PE. The presence of right heart strain has been associated with an increased risk of morbidity and mortality. Please activate Code PE by paging [phone]. 2. No visible pneumomediastinum. 3. Irregular visceral pleural thickening  involving the left lung with a large left hydropneumothorax, concerning for empyema. 4. Small right pleural effusion and adjacent passive atelectasis. 5. Nodular hepatic surface contour, consistent with cirrhosis. Moderate volume upper abdominal ascites. 6. Additional features of anasarca diffusely increased attenuation of the mediastinal fat and circumferential body wall edema. 7. Aortic Atherosclerosis (ICD10-I70.0). Currently awaiting provider callback. Addendum will be submitted following contact with the ordering provider or care team. Electronically Signed: By: Lovena Le M.D. On: 02/28/2019 22:11   Dg Chest Port 1 View  Result Date: 03/02/2019 CLINICAL DATA:  Follow-up pleural effusion. EXAM: PORTABLE CHEST 1 VIEW COMPARISON:  Chest x-ray 03/01/2019 and chest CT 02/28/2019 FINDINGS: The cardiac silhouette, mediastinal and hilar contours are stable. Stable left IJ central venous catheter. The left-sided pleural drainage catheter is in stable position. Interval decrease in size of the left-sided pneumothorax. Stable numerous cavitary lesions in both lungs consistent with septic emboli. IMPRESSION: Left chest tube in good position with interval decrease in size of the left-sided pneumothorax. Stable bilateral septic emboli and infiltrates. Electronically Signed   By: Marijo Sanes M.D.   On: 03/02/2019 08:23   Dg Chest Port 1 View  Result Date: 03/01/2019 CLINICAL DATA:  48 year old male with a history of chest tube placement EXAM: PORTABLE CHEST 1 VIEW COMPARISON:  CT chest 02/28/2019, plain film 02/28/2019 FINDINGS: Cardiomediastinal silhouette is unchanged in size and contour. Unchanged left IJ central venous catheter with the tip appearing to terminate superior cavoatrial junction. Interval placement thoracostomy tube the left lung base. Components of pneumothorax in the upper lung persist with incomplete re-expansion of the lung. Opacity within the left mid and lower lung persists obscuring the  left hemidiaphragm in the left heart border. Nodular opacities of the right lung persist. No right pneumothorax or right pleural effusion. IMPRESSION: Interval placement of left-sided thoracostomy tube at the lung base with incomplete re-expansion of the left lung and persisting pneumothorax/hydropneumothorax. Similar appearance of the lungs with multifocal septic emboli and consolidation/scarring at the left lung base. Unchanged left IJ central venous catheter. Electronically Signed   By: Corrie Mckusick D.O.   On: 03/01/2019 12:43   Dg Chest Port 1 View  Addendum Date: 02/28/2019   ADDENDUM REPORT: 02/28/2019 19:27 ADDENDUM: Critical Value/emergent results were called by telephone at the time of interpretation on 02/28/2019 at 7:27 pm to providerKaren Baltazar Najjar, who verbally acknowledged these results. Electronically Signed   By: Zetta Bills M.D.   On: 02/28/2019 19:27   Result Date: 02/28/2019 CLINICAL DATA:  Elevated heart rate and dyspnea. EXAM: PORTABLE CHEST 1 VIEW COMPARISON:  02/24/2019 FINDINGS: New loculated left pneumothorax in the setting of cavitary lung lesions. Moderately large pneumothorax and areas of collapse/consolidation in the left lung base also with sub pulmonic lucency and deep sulcus sign. Concomitant left pleural effusion is suspected. Cavitary lesions in the right chest are unchanged. Mild rotation with probable slight mediastinal shift. Post extubation with left IJ central venous catheter terminating at the caval to atrial junction as before. No acute bone process. IMPRESSION: 1. New loculated left pneumothorax in the setting of cavitary lung lesions. Mediastinal shift while slight is present on the current  study. 2. Stable cavitary lesions in the right chest. 3. Post extubation. 4. A call is out to the referring provider to further discuss above findings. Electronically Signed: By: Zetta Bills M.D. On: 02/28/2019 19:19   Vas [place] Lower Extremity Venous (dvt)  Result Date:  03/01/2019  Lower Venous Study Indications: Pulmonary embolism.  Risk Factors: None identified. Comparison Study: No prior studies. Performing Technologist: Oliver Hum RVT  Examination Guidelines: A complete evaluation includes B-mode imaging, spectral Doppler, color Doppler, and power Doppler as needed of all accessible portions of each vessel. Bilateral testing is considered an integral part of a complete examination. Limited examinations for reoccurring indications may be performed as noted. The reflux portion of the exam is performed with the patient in reverse Trendelenburg.  +---------+---------------+---------+-----------+----------+--------------+  RIGHT     Compressibility Phasicity Spontaneity Properties Thrombus Aging  +---------+---------------+---------+-----------+----------+--------------+  CFV       Full            Yes       Yes                                    +---------+---------------+---------+-----------+----------+--------------+  SFJ       Full                                                             +---------+---------------+---------+-----------+----------+--------------+  FV Prox   Full                                                             +---------+---------------+---------+-----------+----------+--------------+  FV Mid    Full                                                             +---------+---------------+---------+-----------+----------+--------------+  FV Distal Full                                                             +---------+---------------+---------+-----------+----------+--------------+  PFV       Full                                                             +---------+---------------+---------+-----------+----------+--------------+  POP       Full            Yes       Yes                                    +---------+---------------+---------+-----------+----------+--------------+  PTV       Full                                                              +---------+---------------+---------+-----------+----------+--------------+  PERO      Full                                                             +---------+---------------+---------+-----------+----------+--------------+   +---------+---------------+---------+-----------+----------+--------------+  LEFT      Compressibility Phasicity Spontaneity Properties Thrombus Aging  +---------+---------------+---------+-----------+----------+--------------+  CFV       Full            Yes       Yes                                    +---------+---------------+---------+-----------+----------+--------------+  SFJ       Full                                                             +---------+---------------+---------+-----------+----------+--------------+  FV Prox   Full                                                             +---------+---------------+---------+-----------+----------+--------------+  FV Mid    Full                                                             +---------+---------------+---------+-----------+----------+--------------+  FV Distal Full                                                             +---------+---------------+---------+-----------+----------+--------------+  PFV       Full                                                             +---------+---------------+---------+-----------+----------+--------------+  POP       Full            Yes       Yes                                    +---------+---------------+---------+-----------+----------+--------------+  PTV       Full                                                             +---------+---------------+---------+-----------+----------+--------------+  PERO      Full                                                             +---------+---------------+---------+-----------+----------+--------------+     Summary: Right: No evidence of common femoral vein obstruction. There is no evidence of deep vein thrombosis  in the lower extremity. No cystic structure found in the popliteal fossa. Left: There is no evidence of deep vein thrombosis in the lower extremity. No cystic structure found in the popliteal fossa.  *See table(s) above for measurements and observations. Electronically signed by Curt Jews MD on 03/01/2019 at 6:27:25 PM.    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  There were no evidence of vegetations on the mitral valve but clearly he had them since he has septic embolization to the lungs with cavitary pathology and now loculated pneumothorax concerning for empyema he also has a pulmonary embolism. He is sp Chest TUBE placement He does also have a small pericardial effusion.  #1 Metastatic MSSAB as described:       [facility] Staphylococcus aureus bacteremia   Staphylococcus aureus bacteremia (SAB) is associated with a high rate of complications and mortality.  Specific aspects of clinical management are critical to optimizing the outcome of patients with SAB.  Therefore, the [facility] Antimicrobial Management Team [facility]) has initiated an intervention aimed at improving the management of SAB at [facility].  To do so,  Infectious Diseases physicians are providing an evidence-based consult for the management of all patients with SAB.     Yes No Comments  Perform follow-up blood cultures (even if the patient is afebrile) to ensure clearance of bacteremia _0  _1  Need to repeat AGAIN post removal of central line  Remove vascular catheter and obtain follow-up blood cultures after the removal of the catheter _2  _3  CENTRA LINE NEEDS TO COME OUT when possible and I am trying to accomplish today. Shouldh ave several day holiday with repeat cultures in bettween cocumenting clearance  Perform echocardiography to evaluate for endocarditis (transthoracic ECHO is 40-50%  sensitive, TEE is > 90% sensitive) _0  _1  TEE negative but undoubtedly has simply thrown vegetqation into lungs and he has pericardial effusion  Consult electrophysiologist to evaluate implanted cardiac device (pacemaker, ICD) _2  _3    Ensure source control _4  _5  Need to monitor Neuro exam, CT tube in place but might need decortication  Investigate for metastatic sites of infection _6  _7  Does the patient have ANY symptom or physical exam finding that would suggest a deeper infection (back or neck pain that may be suggestive of vertebral osteomyelitis or epidural abscess, muscle pain that could be a symptom of pyomyositis)?  Keep in mind that for deep seeded infections MRI imaging with contrast is preferred rather than other often insensitive tests such as plain x-rays, especially early in a patient's presentation.  Change antibiotic therapy to cefazolin _8  _9  Beta-lactam antibiotics are preferred for MSSA due to higher cure rates.   If on Vancomycin, goal trough should be 15 - 20 mcg/mL  Estimated duration of IV antibiotic therapy:  6-8 weeks but would need to be int he hospital _10  _11  Consult case management for probably prolonged outpatient IV antibiotic therapy   #2 #2 Lumbar diskitis/vertebral osteomyelitis: monitor Neuro exam and continue antibiotics  #3   Loculated effusion with pneumothorax  With CT. May need CVTS  #4 Pericardial effusion: would repeat TTE in a week  #4HCV +: seems to have cleared, needs HBV and HAV vaccination.  LOS: 8 days   Alcide Evener 03/02/2019, 9:24 AM

## 2019-03-03 ENCOUNTER — Inpatient Hospital Stay (HOSPITAL_COMMUNITY): Payer: Self-pay

## 2019-03-03 DIAGNOSIS — J869 Pyothorax without fistula: Secondary | ICD-10-CM

## 2019-03-03 LAB — GLUCOSE, CAPILLARY
Glucose-Capillary: 93 mg/dL (ref 70–99)
Glucose-Capillary: 177 mg/dL — ABNORMAL HIGH (ref 70–99)
Glucose-Capillary: 125 mg/dL — ABNORMAL HIGH (ref 70–99)
Glucose-Capillary: 122 mg/dL — ABNORMAL HIGH (ref 70–99)

## 2019-03-03 LAB — BASIC METABOLIC PANEL
Anion gap: 6 (ref 5–15)
BUN: 25 mg/dL — ABNORMAL HIGH (ref 6–20)
CO2: 23 mmol/L (ref 22–32)
Calcium: 7.4 mg/dL — ABNORMAL LOW (ref 8.9–10.3)
Chloride: 107 mmol/L (ref 98–111)
Creatinine, Ser: 0.44 mg/dL — ABNORMAL LOW (ref 0.61–1.24)
GFR calc Af Amer: >60 mL/min (ref >60)
GFR calc non Af Amer: >60 mL/min (ref >60)
Glucose, Bld: 137 mg/dL — ABNORMAL HIGH (ref 70–99)
Potassium: 3.4 mmol/L — ABNORMAL LOW (ref 3.5–5.1)
Sodium: 136 mmol/L (ref 135–145)

## 2019-03-03 LAB — LACTATE DEHYDROGENASE: LDH: 198 U/L — ABNORMAL HIGH (ref 98–192)

## 2019-03-03 LAB — TRANSFERRIN: Transferrin: 112 mg/dL — ABNORMAL LOW (ref 180–329)

## 2019-03-03 IMAGING — DX DG CHEST 1V
1 series · 1 of 1 positions shown · non-contrast
Comparison: Multiple prior comparison is most recently [DATE],
dating to [DATE]

CLINICAL DATA: 47-year-old male with increased shortness of breath

EXAM:
CHEST  1 VIEW

[chest ap]
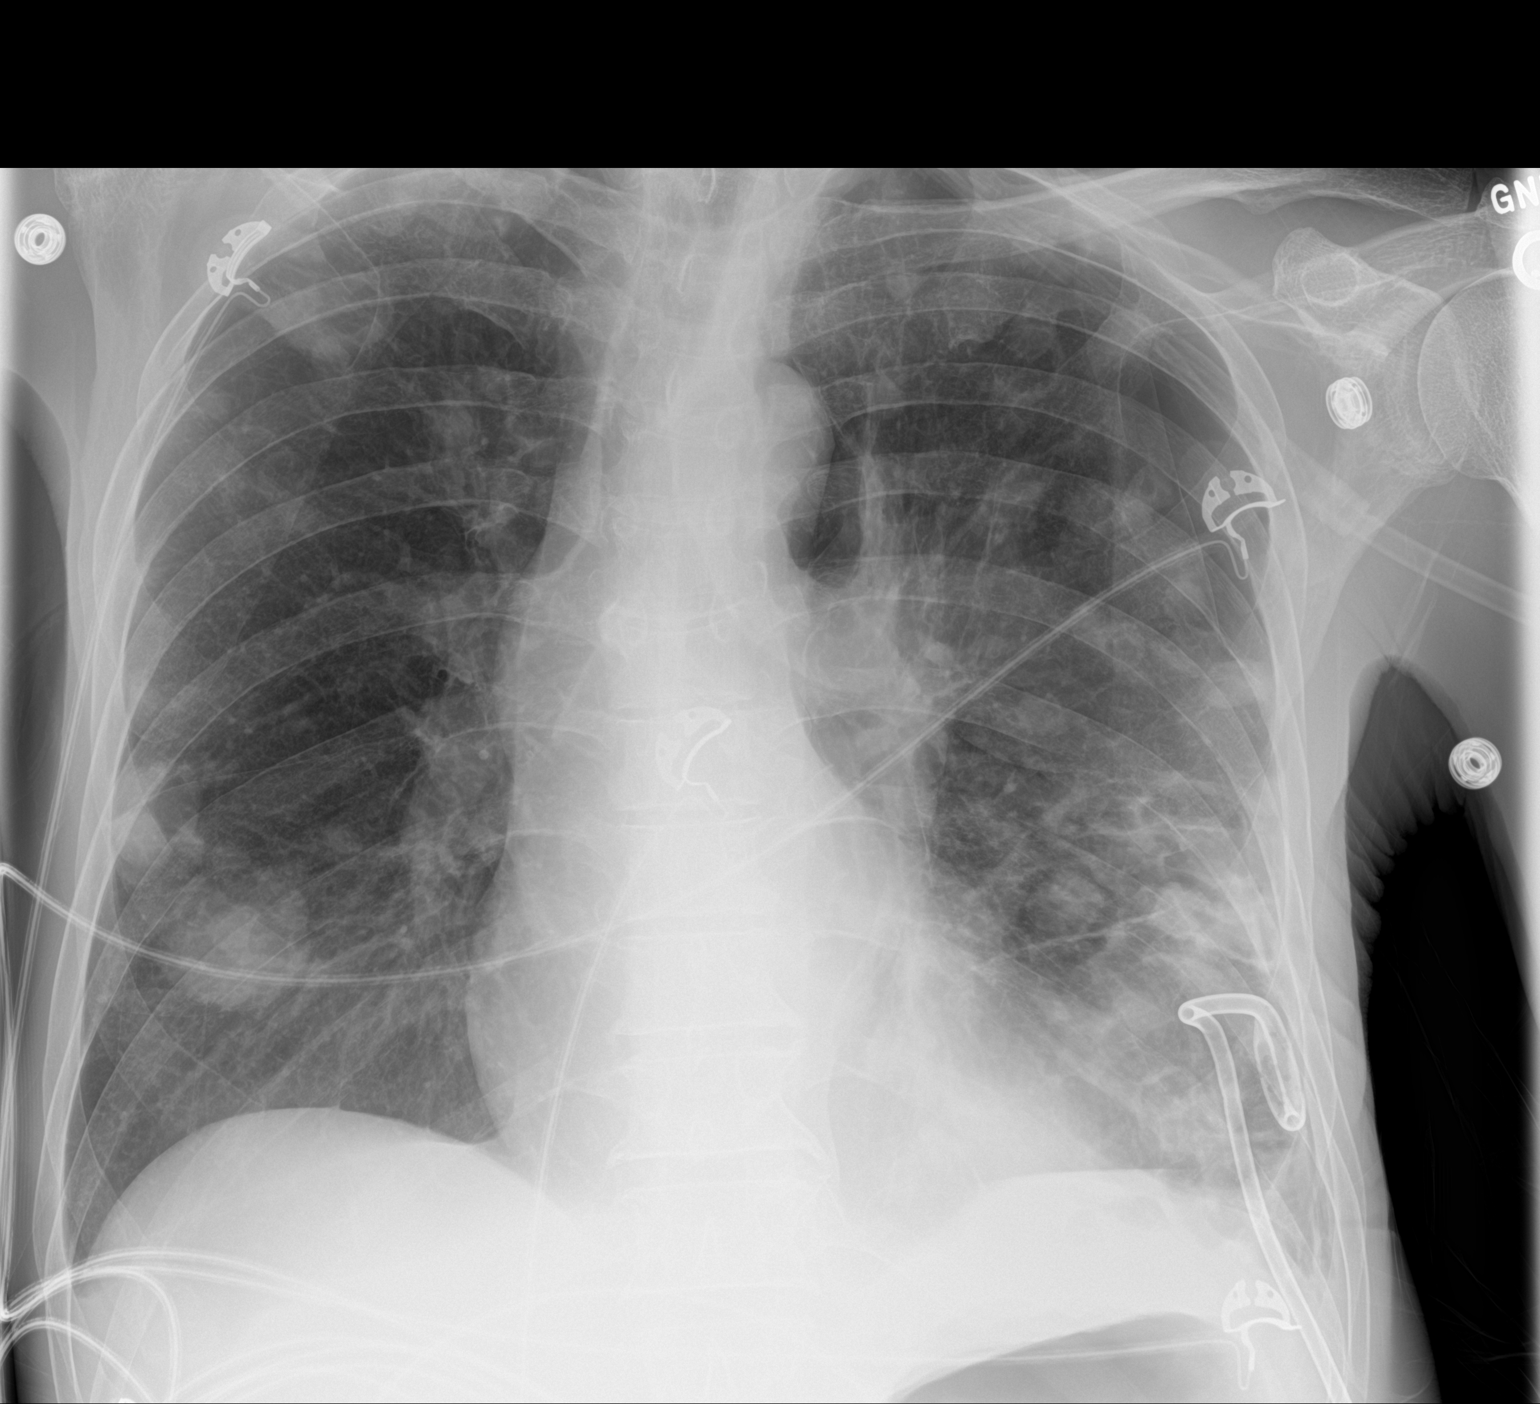

[1 of 1 positions shown; findings below may reference images not displayed]

FINDINGS: Cardiomediastinal silhouette unchanged in size and contour.

Nodules throughout the lungs again noted, compatible with known
septic emboli.

Unchanged position of thoracostomy tube at the left lung base.

The small pneumothorax at the lateral left lung appears larger than
the comparison plain film.

Similar appearance of coarsened interstitial markings throughout.
IMPRESSION: Unchanged thoracostomy tube.

The small left pneumothorax appears slightly larger than the
comparison plain film though overall has been improving over the
comparison chest x-rays dating to [DATE].

Multiple pulmonary nodules compatible with known septic emboli.

## 2019-03-03 IMAGING — DX DG CHEST 1V PORT
1 series · 1 of 1 positions shown · non-contrast
Comparison: [DATE].  CT

CLINICAL DATA: Left-sided pneumothorax.

EXAM:
PORTABLE CHEST 1 VIEW

[chest ap]
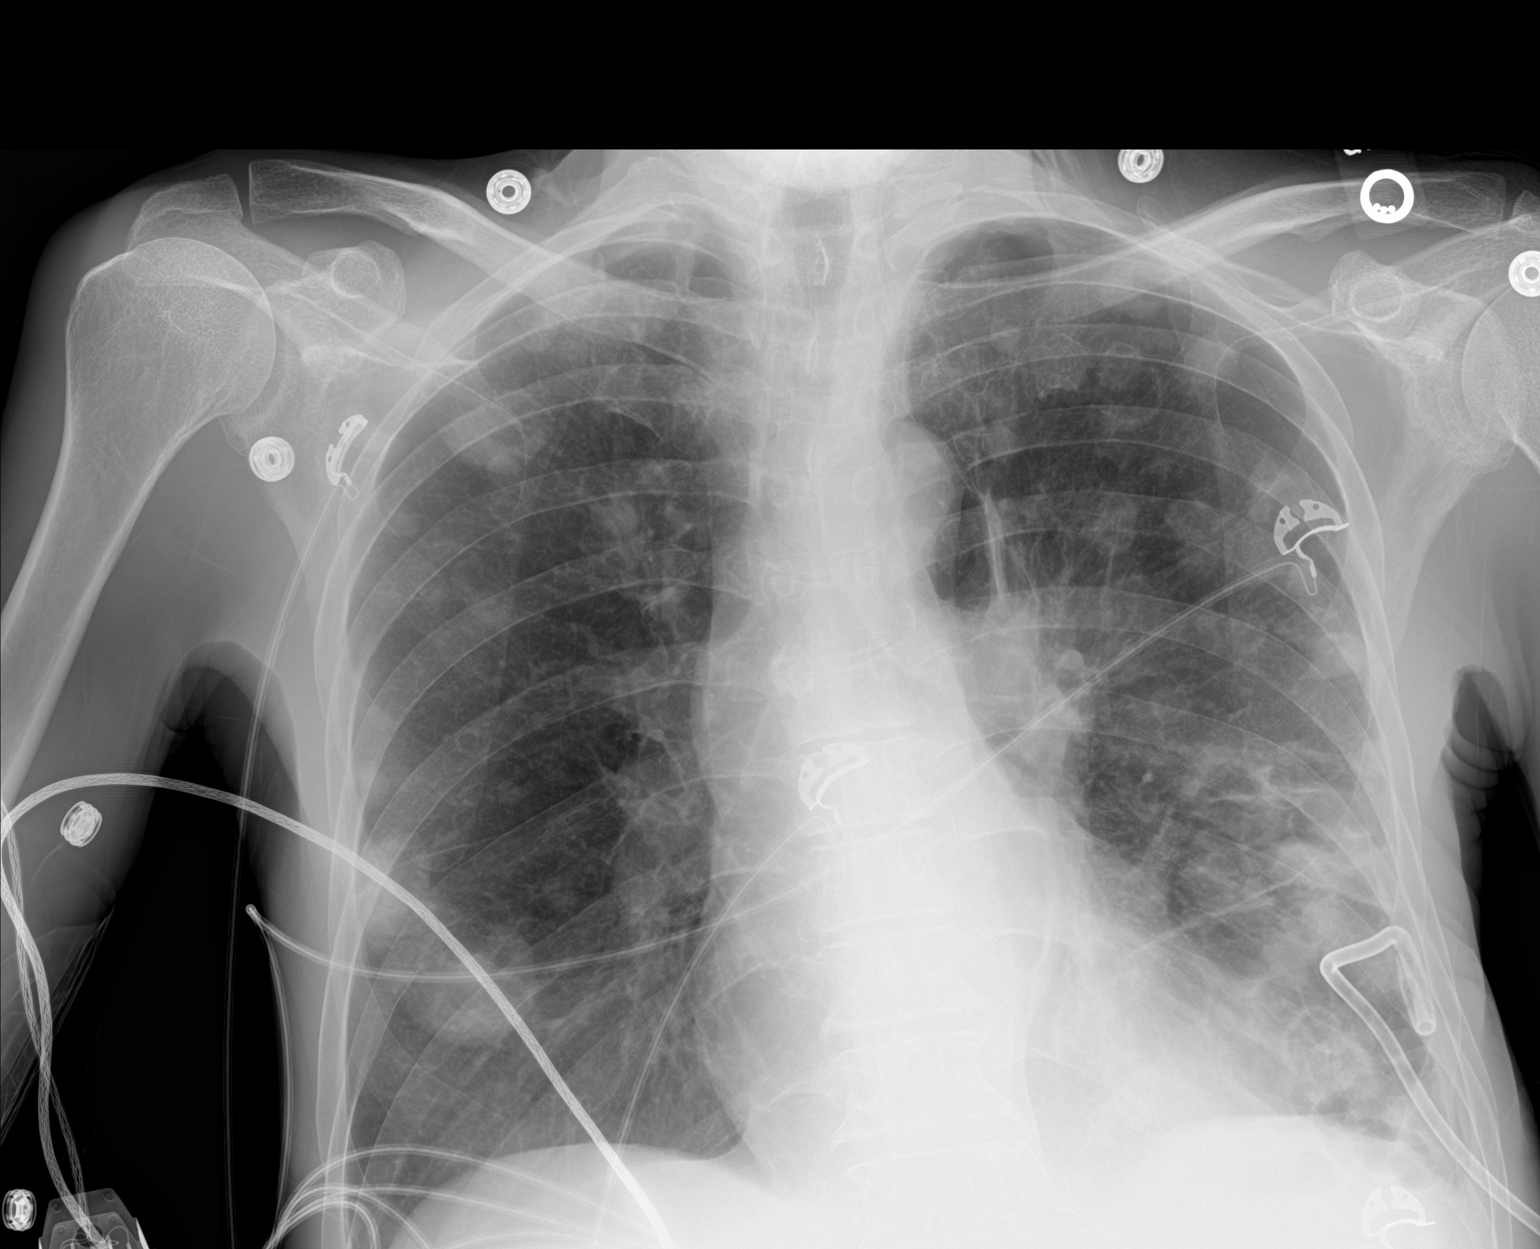

[1 of 1 positions shown; findings below may reference images not displayed]

FINDINGS: Left chest tube in stable position. Small left pneumothorax
unchanged. Multiple bilateral cavitary pulmonary nodules consistent
with septic emboli again noted. These have progressed in number and
size from prior exam. Heart size normal. No acute bony abnormality.
IMPRESSION: 1. Left chest tube in stable position. Stable small left
pneumothorax. 2. Multiple cavitary pulmonary nodules consistent with
septic emboli are again noted. These have progressed in number and
size from prior exam.

## 2019-03-03 MED ORDER — KETOROLAC TROMETHAMINE 15 MG/ML IJ SOLN
15.0000 mg | Freq: Four times a day (QID) | INTRAMUSCULAR | Status: AC
  Administered 2019-03-03 (×2): 15 mg via INTRAVENOUS
  Filled 2019-03-03 (×2): qty 1

## 2019-03-03 MED ORDER — METOPROLOL TARTRATE 50 MG PO TABS
50.0000 mg | ORAL_TABLET | Freq: Two times a day (BID) | ORAL | Status: DC
  Administered 2019-03-03 – 2019-03-25 (×45): 50 mg via ORAL
  Filled 2019-03-03 (×5): qty 1
  Filled 2019-03-03: qty 2
  Filled 2019-03-03 (×10): qty 1
  Filled 2019-03-03: qty 2
  Filled 2019-03-03 (×13): qty 1
  Filled 2019-03-03: qty 2
  Filled 2019-03-03 (×5): qty 1
  Filled 2019-03-03: qty 2
  Filled 2019-03-03: qty 1
  Filled 2019-03-03: qty 2
  Filled 2019-03-03 (×6): qty 1

## 2019-03-03 MED ORDER — PRO-STAT SUGAR FREE PO LIQD
30.0000 mL | Freq: Two times a day (BID) | ORAL | Status: DC
  Administered 2019-03-04 – 2019-03-09 (×9): 30 mL via ORAL
  Filled 2019-03-03 (×9): qty 30

## 2019-03-03 NOTE — Progress Notes (Signed)
..   NAME:  SIDDIQ KALUZNY, MRN:  [id number], DOB:  1970-08-17, LOS: 9 ADMISSION DATE:  02/21/2019, CONSULTATION DATE:  02/28/2019 REFERRING MD:  Earnest Conroy MD, CHIEF COMPLAINT:  SOB   Brief History   48 yr old M with PMHx of rheumatic fever, HTN, IV heroin abuse presented on 10/27 w/ back pain and lower ext weakness. 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 Recent TEE shows mobile mass moderate in size located in the mid/ apical  RV that appears to be attached to a trabeculation. NP overnight evaluated patient at shift change noted to be tachypneic. New CXR shows left sided loculated pneumothorax. PCCM consulted    Past Medical History  .Marland Kitchen Active Ambulatory Problems    Diagnosis Date Noted  . Shortness of breath 10/01/2015  . Dyspnea 10/01/2015  . HTN (hypertension) 10/01/2015  . 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 Heroin use 10/01/2015  . Tobacco abuse 10/01/2015  . Drug abuse ([place])   . Hypertension 04/27/2010  . H/O: rheumatic fever    Resolved Ambulatory Problems    Diagnosis Date Noted  . No Resolved Ambulatory Problems   No Additional Past Medical History     Significant Hospital Events    10/27 with worsening back pain and BLE weakness.  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 I nitially evaluated by PCCM on 10/28 when pt became obtunded and dyspneic. Pt was endotracheally intubated for acute hypoxemic respiratory failure. He received a Left IJ CVC on 10/28 with follow up CXR showing no complications. He received IV Antibiotics for metastatic MSSAB infection, initially on Vancomycin and Cefepime tapered to Ancef.  He was evaluated with bedside echo and found to have pericardial effusion without tamponade. Formal TTE showed G!DD w EF 60% and possible mitral valve vegetation.  Pt received TEE on 11/3 at [facility] (w/o intubation, tolerated procedure well) and was found to have  mobile mass moderate in size located in the mid/  apical  RV that appears to be attached to a trabeculation.  11/3: NP overnight evaluated patient at shift change noted the pt to be tachypneic. Pt reported to NP that he felt more SOB than baseline not not in acute distress with no significant desaturations. A New CXR was completed: results were called to [facility] by NP.  It shows a new left sided loculated pneumothorax.  11/4: Chest tube placed, marked improvement in pneumothorax 11/6: Pleural fluid volume much improved.  Ongoing 1 out of 7 airleak.  Suction from pleural tube increased to 30.  Awaiting cultures, currently has gram-positive cocci in clusters  Consults:  10/28>>Infectious Disease 10/28>>PCCM 10/29>> Cardiology 11/3>>> PCCM re-consult 11/4: Phone consult with thoracic surgery Procedures:  10/28>>CT guided aspiration of right psoas abscesses 10/28>>Endotracheally intubated 10/28>> LIJ CVC placement 10/28>>Bronchoscopy with BAL 10/31>>Extubated to 3L [place] by RT 11/3>>> Transesophageal Echo 11/4: Left-sided chest tube Significant Diagnostic Tests:  11/3 CTA FINDINGS: Cardiovascular: Satisfactory opacification the pulmonary arteries to the segmental level. Segmental and subsegmental pulmonary artery emboli are noted throughout the left lower lobe, lingula and with the lobar, segmental and subsegmental arteries of the right upper lobe. Suspect more extensive embolic burden however given the numerous cavitary lesions throughout the lungs compatible with septic emboli. Flattening of the intraventricular septum and mild elevation of the RV LV ratio (0.95) concerning for right heart strain. Cardiac size remains within normal limits. Few coronary artery calcifications are present. No sizable pericardial effusion though difficult to discern given increased attenuation of the  mediastinal fat.  Mediastinum/Nodes: Diffusely increased attenuation of the mediastinal fat suggesting soft tissue edema. Prominent mediastinal and hilar  adenopathy, likely reactive. No acute abnormality of the trachea or esophagus. Thyroid gland and thoracic inlet are unremarkable.  Lungs/Pleura: There are numerous rounded and cavitary fluid-filled lesions throughout the lungs suspicious for septic pulmonary emboli. Some interspersed regions of ground-glass attenuation and tree-in-bud nodularity noted in the left lung base. There is irregular visceral pleural thickening involving the left lung with a large left hydropneumothorax with dependently layering simple attenuation fluid. Small right pleural effusion present as well with adjacent passive atelectasis.  Upper Abdomen: Nodular hepatic surface contour. Moderate volume upper abdominal ascites.  Musculoskeletal: Nonunited subacute to remote lateral left tenth rib fracture.No acute osseous abnormality or suspicious osseous lesion. Paucity of subcutaneous fat. Body wall edema.    IMPRESSION: 1. Features of widespread septic emboli with more central pulmonary arterial filling defects seen within the left lower lobe, positive for acute PE with CT evidence of right heart strain (RV/LV Ratio = 0.95) consistent with at least submassive (intermediate risk) PE. 2. No visible pneumomediastinum. 3. Irregular visceral pleural thickening involving the left lung with a large left hydropneumothorax, concerning for empyema. 4. Small right pleural effusion and adjacent passive atelectasis. 5. Nodular hepatic surface contour, consistent with cirrhosis. Moderate volume upper abdominal ascites. 6. Additional features of anasarca diffusely increased attenuation of the mediastinal fat and circumferential body wall edema. 7. Aortic Atherosclerosis   11/3 CXR FINDINGS: New loculated left pneumothorax in the setting of cavitary lung lesions. Moderately large pneumothorax and areas of collapse/consolidation in the left lung base also with sub pulmonic lucency and deep sulcus sign. Concomitant  left pleural effusion is suspected. Cavitary lesions in the right chest are unchanged. Mild rotation with probable slight mediastinal shift. Post extubation with left IJ central venous catheter terminating at the caval to atrial junction as before. No acute bone process.  MR LUMBAR AND THORACIC SPINE W W/O CONTRAST IMPRESSION: 1. Abnormal enhancement of the L4 and L5 vertebral bodies and disc space, consistent with discitis osteomyelitis. There is severe bilateral foraminal stenosis at the L4-5 level with abnormal contrast enhancement extending into both neural foramina. 2. Bilateral psoas muscle abscesses, measuring up to 2.6 cm on the right and 2.5 cm on the left. Paraspinal soft tissue inflammation at L4-5 with fluid in both L4-5 facet joints, which may be reactive or indicative of septic arthritis. 3. Abnormal signal and enhancement of the T1 vertebral body with surrounding paraspinal soft tissue enhancement, possibly indicating osteomyelitis. 4. Multiple cavitary lesions within both lungs, consistent with septic emboli.   10/28 US RENAL IMPRESSION: Mild hydronephrosis is noted. This may be related to the distended Bladder. Mild debris within the bladder which may be related to an underlying UTI. Clinical correlation is recommended. Increased echogenicity is noted consistent with medical renal disease.   Micro Data:  Bllood cx x 2 on 10/27>>> + MSSA Blood cx  On 10/28>>>>>>+ MSSA Abscess Rt Psoas>>MODERATE STAPHYLOCOCCUS AUREUS 10/28 resp cx on 10/28>>MODERATE STAPHYLOCOCCUS AUREUS  Repeat Blood cx on 10/30>>NGTD (4 days) Pleural fluid 11/4>>> Antimicrobials:  Abx course--Cefepime10/28 >>10/29 ,Vanc 10/28 >>10/30 Cefazolin 10/30>>   Subjective   Still with pain.  Still with air leak.  Objective   Blood pressure (Abnormal) 150/91, pulse 91, temperature (Abnormal) 97.5 F (36.4 C), temperature source Oral, resp. rate 15, height 6' (1.829 m), weight 53.9 kg, SpO2  97 %.        Intake/Output Summary (Last 24 hours)  at 03/03/2019 0945 Last data filed at 03/03/2019 4709 Gross per 24 hour  Intake 272.9 ml  Output 1740 ml  Net -1467.1 ml   Filed Weights   03/01/19 0428 03/02/19 0430 03/03/19 0500  Weight: 53.9 kg 53.9 kg 53.9 kg    Examination:  General cachectic eight 48 year old white male resting in bed no acute distress but does report ongoing discomfort not much different than last prior day HEENT normocephalic atraumatic does have temporal wasting mucous membranes moist poor dentition Pulmonary: Coarse scattered rhonchi pleural rub on the left.  Has a 1 out of 7 airleak chest tube was increased to 30 cm suction Cardiac: Regular rate and rhythm without murmur rub or gallop Abdomen: Soft nontender no organomegaly Extremities: Warm and dry.  Has dependent edema primarily in his feet Neuro: Awake oriented no focal deficits appreciated.  Resolved Hospital Problem list   Uremia and Hypernatremia resolving AKI Extubated on 10/31 Blood cx on 10/ Hypernatremia   Assessment & Plan:   Cavitary PNA w/  Numerous rounded and cavitary fluid-filled lesions in the left lung Left Hydropneumothorax concerning for empyema Septic VTE Pulmonary emboli  MSSA bacteremia/endocarditis w/ mobile mass in the RV further c/b : Discitis, osteomyelitis, bilateral psoas muscle abscesses, septic emboli of lungs and cavitary PNA also small pericardial effusion HTN Cirrhosis with ascites Normocytic Anemia Intravascular hemolytic process>>Schistocytes on smear   pulm problem Discussion MSSA cavitary pneumonia secondary to septic pulmonary emboli, and subsequent large pneumothoraces and empyema on left now status post ultrasound-guided tube thoracostomy on 11/4 -Cultures still pending from pleural sample -Chest x-ray personally reviewed still shows loculated left effusion however lung better inflated on the left.  Scattered areas of diffuse cavitary lesions without  change -Still has 1 out of 7 airleak -Still has significant pain  Plan Await current cultures  Change chest tube suction to 30  Toradol for pleuritic discomfort  A.m. chest x-ray  Eventually needs repeat CT chest to reevaluate pleural space    Best practice:  Diet: keep NPO for possible procedure Pain/Anxiety/Delirium protocol (if indicated): for back pain per primary VAP protocol (if indicated): not intubated  DVT prophylaxis: per primary GI prophylaxis: per primary Glucose control: per primary Mobility: bedrest Code Status: no shock no compressions or intubation Prognosis: poor. Strongly recommend Palliative consult Disposition: remains on SD with TRH     Critical care time:NA     Erick Colace ACNP-BC [place] Pager # [phone] OR # [phone] if no answer

## 2019-03-03 NOTE — Progress Notes (Signed)
Subjective:  Still complaining of back pain   Antibiotics:  Anti-infectives (From admission, onward)   Start     Dose/Rate Route Frequency Ordered Stop   02/24/19 2200  vancomycin (VANCOCIN) IVPB 1000 mg/200 mL premix  Status:  Discontinued     1,000 mg 200 mL/hr over 60 Minutes Intravenous Every 12 hours 02/24/19 1010 02/24/19 1253   02/24/19 1600  ceFAZolin (ANCEF) IVPB 2g/100 mL premix     2 g 200 mL/hr over 30 Minutes Intravenous Every 8 hours 02/24/19 1253     02/23/19 2200  vancomycin (VANCOCIN) IVPB 750 mg/150 ml premix  Status:  Discontinued     750 mg 150 mL/hr over 60 Minutes Intravenous Every 24 hours 02/23/19 0853 02/23/19 0909   02/23/19 2200  vancomycin (VANCOCIN) IVPB 750 mg/150 ml premix  Status:  Discontinued     750 mg 150 mL/hr over 60 Minutes Intravenous Every 12 hours 02/23/19 0909 02/24/19 1010   02/22/19 2200  vancomycin (VANCOCIN) IVPB 750 mg/150 ml premix  Status:  Discontinued     750 mg 150 mL/hr over 60 Minutes Intravenous Every 24 hours 02/22/19 0826 02/22/19 0827   02/22/19 2200  vancomycin (VANCOCIN) IVPB 1000 mg/200 mL premix  Status:  Discontinued     1,000 mg 200 mL/hr over 60 Minutes Intravenous Every 24 hours 02/22/19 0827 02/23/19 0853   02/22/19 0900  ceFEPIme (MAXIPIME) 2 g in sodium chloride 0.9 % 100 mL IVPB  Status:  Discontinued     2 g 200 mL/hr over 30 Minutes Intravenous Every 12 hours 02/22/19 0826 02/23/19 1135   02/21/19 2015  ceFEPIme (MAXIPIME) 2 g in sodium chloride 0.9 % 100 mL IVPB     2 g 200 mL/hr over 30 Minutes Intravenous  Once 02/21/19 2013 02/22/19 0031   02/21/19 2015  metroNIDAZOLE (FLAGYL) IVPB 500 mg     500 mg 100 mL/hr over 60 Minutes Intravenous  Once 02/21/19 2013 02/22/19 0031   02/21/19 2015  vancomycin (VANCOCIN) IVPB 1000 mg/200 mL premix     1,000 mg 200 mL/hr over 60 Minutes Intravenous  Once 02/21/19 2013 02/22/19 0200      Medications: Scheduled Meds:  amLODipine  10 mg Oral Daily    Chlorhexidine Gluconate Cloth  6 each Topical Daily   feeding supplement (ENSURE ENLIVE)  237 mL Oral BID BM   insulin aspart  0-24 Units Subcutaneous TID AC & HS   ketorolac  15 mg Intravenous Q6H   mouth rinse  15 mL Mouth Rinse BID   metoprolol tartrate  50 mg Oral BID   multivitamin with minerals  1 tablet Oral Daily   pantoprazole  40 mg Oral Daily   sodium chloride flush  10-40 mL Intracatheter Q12H   Continuous Infusions:   ceFAZolin (ANCEF) IV Stopped (03/03/19 0643)   sodium chloride     PRN Meds:.acetaminophen **OR** acetaminophen, acetaminophen, clonazePAM, hydrALAZINE, HYDROmorphone (DILAUDID) injection, ipratropium-albuterol, ondansetron **OR** ondansetron (ZOFRAN) IV, oxyCODONE-acetaminophen, senna-docusate, sodium chloride flush    Objective: Weight change: 0 kg  Intake/Output Summary (Last 24 hours) at 03/03/2019 1012 Last data filed at 03/03/2019 0643 Gross per 24 hour  Intake 272.9 ml  Output 1740 ml  Net -1467.1 ml   Blood pressure (!) 148/96, pulse 95, temperature (!) 97.5 F (36.4 C), temperature source Oral, resp. rate 16, height 6' (1.829 m), weight 53.9 kg, SpO2 98 %. Temp:  [97.5 F (36.4 C)-98.9 F (37.2 C)] 97.5 F (36.4 C) (11/06  0800) Pulse Rate:  [86-107] 95 (11/06 1000) Resp:  [11-29] 16 (11/06 1000) BP: (145-177)/(91-110) 148/96 (11/06 1000) SpO2:  [92 %-100 %] 98 % (11/06 1000) Weight:  [53.9 kg] 53.9 kg (11/06 0500)  Physical Exam: General: Alert and awake, oriented x3,cachectic, facial wasting HEENT: anicteric sclera, EOMI CVStach rate, normal  No mgr Chest: , coarse breath sounds, CT in place with bloody material, purulent material Abdomen: soft non-distended,  Extremities: 3+ edema in LE Skin: no rashes Neuro: nonfocal  CBC:    BMET Recent Labs    03/01/19 0418 03/02/19 0500  NA 140 138  K 3.9 3.9  CL 107 105  CO2 26 25  GLUCOSE 131* 106*  BUN 30* 27*  CREATININE 0.48* 0.47*  CALCIUM 7.7* 7.7*     Liver  Panel  No results for input(s): PROT, ALBUMIN, AST, ALT, ALKPHOS, BILITOT, BILIDIR, IBILI in the last 72 hours.     Sedimentation Rate No results for input(s): ESRSEDRATE in the last 72 hours. C-Reactive Protein No results for input(s): CRP in the last 72 hours.  Micro Results: Recent Results (from the past 720 hour(s))  Blood Culture (routine x 2)     Status: Abnormal   Collection Time: 02/21/19  8:13 PM   Specimen: BLOOD LEFT FOREARM  Result Value Ref Range Status   Specimen Description   Final    BLOOD LEFT FOREARM Performed at [facility], [address], [place], [place] [address]    Special Requests   Final    BOTTLES DRAWN AEROBIC AND ANAEROBIC Blood Culture results may not be optimal due to an inadequate volume of blood received in culture bottles Performed at [facility] [address], [place], [place] [address]    Culture  Setup Time   Final    GRAM POSITIVE COCCI IN BOTH AEROBIC AND ANAEROBIC BOTTLES CRITICAL RESULT CALLED TO, READ BACK BY AND VERIFIED WITH: PHARMD J LEGGE [id number] AT 1337 BY CM CRITICAL RESULT CALLED TO, READ BACK BY AND VERIFIED WITH: PHARMD E JACKSON [id number] AT 1357 BY CM Performed at [facility], [place] [address], [place], [place] [address]    Culture STAPHYLOCOCCUS AUREUS (A)  Final   Report Status 02/24/2019 FINAL  Final   Organism ID, Bacteria STAPHYLOCOCCUS AUREUS  Final      Susceptibility   Staphylococcus aureus - MIC*    CIPROFLOXACIN <=0.5 SENSITIVE Sensitive     ERYTHROMYCIN >=8 RESISTANT Resistant     GENTAMICIN <=0.5 SENSITIVE Sensitive     OXACILLIN 0.5 SENSITIVE Sensitive     TETRACYCLINE <=1 SENSITIVE Sensitive     VANCOMYCIN 1 SENSITIVE Sensitive     TRIMETH/SULFA <=10 SENSITIVE Sensitive     CLINDAMYCIN <=0.25 SENSITIVE Sensitive     RIFAMPIN <=0.5 SENSITIVE Sensitive     Inducible Clindamycin NEGATIVE Sensitive     * STAPHYLOCOCCUS AUREUS  SARS Coronavirus 2 by RT PCR (hospital order,  performed in [place] hospital lab) Nasopharyngeal Nasopharyngeal Swab     Status: None   Collection Time: 02/21/19  8:16 PM   Specimen: Nasopharyngeal Swab  Result Value Ref Range Status   SARS Coronavirus 2 NEGATIVE NEGATIVE Final    Comment: (NOTE) If result is NEGATIVE SARS-CoV-2 target nucleic acids are NOT DETECTED. The SARS-CoV-2 RNA is generally detectable in upper and lower  respiratory specimens during the acute phase of infection. The lowest  concentration of SARS-CoV-2 viral copies this assay can detect is 250  copies / mL. A  negative result does not preclude SARS-CoV-2 infection  and should not be used as the sole basis for treatment or other  patient management decisions.  A negative result may occur with  improper specimen collection / handling, submission of specimen other  than nasopharyngeal swab, presence of viral mutation(s) within the  areas targeted by this assay, and inadequate number of viral copies  (<250 copies / mL). A negative result must be combined with clinical  observations, patient history, and epidemiological information. If result is POSITIVE SARS-CoV-2 target nucleic acids are DETECTED. The SARS-CoV-2 RNA is generally detectable in upper and lower  respiratory specimens dur ing the acute phase of infection.  Positive  results are indicative of active infection with SARS-CoV-2.  Clinical  correlation with patient history and other diagnostic information is  necessary to determine patient infection status.  Positive results do  not rule out bacterial infection or co-infection with other viruses. If result is PRESUMPTIVE POSTIVE SARS-CoV-2 nucleic acids MAY BE PRESENT.   A presumptive positive result was obtained on the submitted specimen  and confirmed on repeat testing.  While 2019 novel coronavirus  (SARS-CoV-2) nucleic acids may be present in the submitted sample  additional confirmatory testing may be necessary for epidemiological  and / or  clinical management purposes  to differentiate between  SARS-CoV-2 and other Sarbecovirus currently known to infect humans.  If clinically indicated additional testing with an alternate test  methodology [phone]) is advised. The SARS-CoV-2 RNA is generally  detectable in upper and lower respiratory sp ecimens during the acute  phase of infection. The expected result is Negative. Fact Sheet for Patients:  StrictlyIdeas.no Fact Sheet for Healthcare Providers: BankingDealers.co.za This test is not yet approved or cleared by the [place] FDA and has been authorized for detection and/or diagnosis of SARS-CoV-2 by FDA under an Emergency Use Authorization (EUA).  This EUA will remain in effect (meaning this test can be used) for the duration of the COVID-19 declaration under Section 564(b)(1) of the Act, 21 U.S.C. section 360bbb-3(b)(1), unless the authorization is terminated or revoked sooner. Performed at [facility], [place] [address] [place], [place] [address]   MRSA PCR Screening     Status: None   Collection Time: 02/22/19  4:56 AM   Specimen: Nasal Mucosa; Nasopharyngeal  Result Value Ref Range Status   MRSA by PCR NEGATIVE NEGATIVE Final    Comment:        The GeneXpert MRSA Assay (FDA approved for NASAL specimens only), is one component of a comprehensive MRSA colonization surveillance program. It is not intended to diagnose MRSA infection nor to guide or monitor treatment for MRSA infections. Performed at [facility], [place] [address], [place], [place] [address]   Blood Culture (routine x 2)     Status: Abnormal   Collection Time: 02/22/19  5:33 AM   Specimen: BLOOD  Result Value Ref Range Status   Specimen Description   Final    BLOOD LEFT ANTECUBITAL Performed at [facility] [address], [place], [place] [address]    Special Requests   Final    BOTTLES  DRAWN AEROBIC AND ANAEROBIC Blood Culture adequate volume Performed at [facility] [address] [place], [place] [address]    Culture  Setup Time   Final    GRAM POSITIVE COCCI ANAEROBIC BOTTLE ONLY CRITICAL RESULT CALLED TO, READ BACK BY AND VERIFIED WITH: PHARMD E JACKSON [id number] AT 1108 BY CM  Culture (A)  Final    STAPHYLOCOCCUS AUREUS SUSCEPTIBILITIES PERFORMED ON PREVIOUS CULTURE WITHIN THE LAST 5 DAYS. Performed at [facility], [place] [address] [place], [place] [address]    Report Status 02/24/2019 FINAL  Final  Aerobic/Anaerobic Culture (surgical/deep wound)     Status: None   Collection Time: 02/22/19 10:05 AM   Specimen: Abscess  Result Value Ref Range Status   Specimen Description   Final    ABSCESS RT PSOAS Performed at [facility] [address], [place], [place] [address]    Special Requests   Final    NONE Performed at [facility], [place] [address], [place], [place] [address]    Gram Stain   Final    RARE WBC PRESENT,BOTH PMN AND MONONUCLEAR FEW GRAM POSITIVE COCCI IN PAIRS    Culture   Final    MODERATE STAPHYLOCOCCUS AUREUS NO ANAEROBES ISOLATED Performed at [facility], [place] [address], [place], [place] [address]    Report Status 02/27/2019 FINAL  Final   Organism ID, Bacteria STAPHYLOCOCCUS AUREUS  Final      Susceptibility   Staphylococcus aureus - MIC*    CIPROFLOXACIN <=0.5 SENSITIVE Sensitive     ERYTHROMYCIN >=8 RESISTANT Resistant     GENTAMICIN <=0.5 SENSITIVE Sensitive     OXACILLIN 0.5 SENSITIVE Sensitive     TETRACYCLINE <=1 SENSITIVE Sensitive     VANCOMYCIN <=0.5 SENSITIVE Sensitive     TRIMETH/SULFA <=10 SENSITIVE Sensitive     CLINDAMYCIN <=0.25 SENSITIVE Sensitive     RIFAMPIN <=0.5 SENSITIVE Sensitive     Inducible Clindamycin NEGATIVE Sensitive     * MODERATE STAPHYLOCOCCUS AUREUS  Culture, respiratory (non-expectorated)     Status: None   Collection Time:  02/22/19  5:30 PM   Specimen: Bronchoalveolar Lavage; Respiratory  Result Value Ref Range Status   Specimen Description   Final    BRONCHIAL ALVEOLAR LAVAGE Performed at [facility] [address] [place], [place] [address]    Special Requests   Final    NONE Performed at [facility], [place] [address], [place], [place] [address]    Gram Stain   Final    ABUNDANT WBC PRESENT, PREDOMINANTLY PMN MODERATE GRAM POSITIVE COCCI Performed at [facility], [place] [address], [place], [place] [address]    Culture MODERATE STAPHYLOCOCCUS AUREUS  Final   Report Status 02/25/2019 FINAL  Final   Organism ID, Bacteria STAPHYLOCOCCUS AUREUS  Final      Susceptibility   Staphylococcus aureus - MIC*    CIPROFLOXACIN <=0.5 SENSITIVE Sensitive     ERYTHROMYCIN >=8 RESISTANT Resistant     GENTAMICIN <=0.5 SENSITIVE Sensitive     OXACILLIN 0.5 SENSITIVE Sensitive     TETRACYCLINE <=1 SENSITIVE Sensitive     VANCOMYCIN 1 SENSITIVE Sensitive     TRIMETH/SULFA <=10 SENSITIVE Sensitive     CLINDAMYCIN <=0.25 SENSITIVE Sensitive     RIFAMPIN <=0.5 SENSITIVE Sensitive     Inducible Clindamycin NEGATIVE Sensitive     * MODERATE STAPHYLOCOCCUS AUREUS  Culture, blood (Routine X 2) w Reflex to ID Panel     Status: None   Collection Time: 02/24/19 11:47 AM   Specimen: BLOOD LEFT HAND  Result Value Ref Range Status   Specimen Description   Final    BLOOD LEFT HAND Performed at [facility] [address] [place], [place] [address]    Special Requests   Final  BOTTLES DRAWN AEROBIC AND ANAEROBIC Blood Culture adequate volume Performed at [facility] [address], [place], [place] [address]    Culture   Final    NO GROWTH 5 DAYS Performed at [facility], [place] [address] [place], [place] [address]    Report Status 03/01/2019 FINAL  Final  Body fluid culture     Status: None (Preliminary result)   Collection  Time: 03/01/19  1:30 PM   Specimen: Chest; Body Fluid  Result Value Ref Range Status   Specimen Description   Final    CHEST Performed at [facility] [address], [place], [place] [address]    Special Requests   Final    Normal Performed at [facility], [place] [address], [place], [place] [address]    Gram Stain   Final    MODERATE WBC PRESENT, PREDOMINANTLY PMN FEW GRAM POSITIVE COCCI IN CLUSTERS    Culture   Final    CULTURE REINCUBATED FOR BETTER GROWTH Performed at [facility], [place] [address], [place], [place] [address]    Report Status PENDING  Incomplete  Culture, blood (routine x 2)     Status: None (Preliminary result)   Collection Time: 03/02/19  7:10 PM   Specimen: BLOOD RIGHT ARM  Result Value Ref Range Status   Specimen Description   Final    BLOOD RIGHT ARM Performed at [facility], [place] [address], [place], [place] [address]    Special Requests   Final    BOTTLES DRAWN AEROBIC AND ANAEROBIC Blood Culture adequate volume Performed at [facility] [address], [place], [place] [address]    Culture PENDING  Incomplete   Report Status PENDING  Incomplete  Culture, blood (routine x 2)     Status: None (Preliminary result)   Collection Time: 03/02/19  7:17 PM   Specimen: BLOOD LEFT ARM  Result Value Ref Range Status   Specimen Description   Final    BLOOD LEFT ARM Performed at [facility], [place] [address], [place],  [address]    Special Requests   Final    BOTTLES DRAWN AEROBIC ONLY Blood Culture adequate volume Performed at [facility] [address], [place],  [address]    Culture PENDING  Incomplete   Report Status PENDING  Incomplete    Studies/Results: Dg Chest Port 1 View  Result Date: 03/03/2019 CLINICAL DATA:  Left-sided pneumothorax. EXAM: PORTABLE CHEST 1 VIEW COMPARISON:  03/02/2019.  CT FINDINGS: Left chest tube in stable  position. Small left pneumothorax unchanged. Multiple bilateral cavitary pulmonary nodules consistent with septic emboli again noted. These have progressed in number and size from prior exam. Heart size normal. No acute bony abnormality. IMPRESSION: 1. Left chest tube in stable position. Stable small left pneumothorax. 2. Multiple cavitary pulmonary nodules consistent with septic emboli are again noted. These have progressed in number and size from prior exam. Electronically Signed   By: Doyle   On: 03/03/2019 10:04   Dg Chest Port 1 View  Result Date: 03/02/2019 CLINICAL DATA:  Follow-up pleural effusion. EXAM: PORTABLE CHEST 1 VIEW COMPARISON:  Chest x-ray 03/01/2019 and chest CT 02/28/2019 FINDINGS: The cardiac silhouette, mediastinal and hilar contours are stable. Stable left IJ central venous catheter. The left-sided pleural drainage catheter is in stable position. Interval decrease in size of the left-sided pneumothorax. Stable numerous cavitary lesions in both lungs consistent with septic  emboli. IMPRESSION: Left chest tube in good position with interval decrease in size of the left-sided pneumothorax. Stable bilateral septic emboli and infiltrates. Electronically Signed   By: Marijo Sanes M.D.   On: 03/02/2019 08:23   Dg Chest Port 1 View  Result Date: 03/01/2019 CLINICAL DATA:  48 year old male with a history of chest tube placement EXAM: PORTABLE CHEST 1 VIEW COMPARISON:  CT chest 02/28/2019, plain film 02/28/2019 FINDINGS: Cardiomediastinal silhouette is unchanged in size and contour. Unchanged left IJ central venous catheter with the tip appearing to terminate superior cavoatrial junction. Interval placement thoracostomy tube the left lung base. Components of pneumothorax in the upper lung persist with incomplete re-expansion of the lung. Opacity within the left mid and lower lung persists obscuring the left hemidiaphragm in the left heart border. Nodular opacities of the right lung  persist. No right pneumothorax or right pleural effusion. IMPRESSION: Interval placement of left-sided thoracostomy tube at the lung base with incomplete re-expansion of the left lung and persisting pneumothorax/hydropneumothorax. Similar appearance of the lungs with multifocal septic emboli and consolidation/scarring at the left lung base. Unchanged left IJ central venous catheter. Electronically Signed   By: Corrie Mckusick D.O.   On: 03/01/2019 12:43   Vas [place] Lower Extremity Venous (dvt)  Result Date: 03/01/2019  Lower Venous Study Indications: Pulmonary embolism.  Risk Factors: None identified. Comparison Study: No prior studies. Performing Technologist: Oliver Hum RVT  Examination Guidelines: A complete evaluation includes B-mode imaging, spectral Doppler, color Doppler, and power Doppler as needed of all accessible portions of each vessel. Bilateral testing is considered an integral part of a complete examination. Limited examinations for reoccurring indications may be performed as noted. The reflux portion of the exam is performed with the patient in reverse Trendelenburg.  +---------+---------------+---------+-----------+----------+--------------+  RIGHT     Compressibility Phasicity Spontaneity Properties Thrombus Aging  +---------+---------------+---------+-----------+----------+--------------+  CFV       Full            Yes       Yes                                    +---------+---------------+---------+-----------+----------+--------------+  SFJ       Full                                                             +---------+---------------+---------+-----------+----------+--------------+  FV Prox   Full                                                             +---------+---------------+---------+-----------+----------+--------------+  FV Mid    Full                                                             +---------+---------------+---------+-----------+----------+--------------+  FV  Distal Full                                                             +---------+---------------+---------+-----------+----------+--------------+  PFV       Full                                                             +---------+---------------+---------+-----------+----------+--------------+  POP       Full            Yes       Yes                                    +---------+---------------+---------+-----------+----------+--------------+  PTV       Full                                                             +---------+---------------+---------+-----------+----------+--------------+  PERO      Full                                                             +---------+---------------+---------+-----------+----------+--------------+   +---------+---------------+---------+-----------+----------+--------------+  LEFT      Compressibility Phasicity Spontaneity Properties Thrombus Aging  +---------+---------------+---------+-----------+----------+--------------+  CFV       Full            Yes       Yes                                    +---------+---------------+---------+-----------+----------+--------------+  SFJ       Full                                                             +---------+---------------+---------+-----------+----------+--------------+  FV Prox   Full                                                             +---------+---------------+---------+-----------+----------+--------------+  FV Mid    Full                                                             +---------+---------------+---------+-----------+----------+--------------+  FV Distal Full                                                             +---------+---------------+---------+-----------+----------+--------------+  PFV       Full                                                             +---------+---------------+---------+-----------+----------+--------------+  POP       Full            Yes       Yes                                     +---------+---------------+---------+-----------+----------+--------------+  PTV       Full                                                             +---------+---------------+---------+-----------+----------+--------------+  PERO      Full                                                             +---------+---------------+---------+-----------+----------+--------------+     Summary: Right: No evidence of common femoral vein obstruction. There is no evidence of deep vein thrombosis in the lower extremity. No cystic structure found in the popliteal fossa. Left: There is no evidence of deep vein thrombosis in the lower extremity. No cystic structure found in the popliteal fossa.  *See table(s) above for measurements and observations. Electronically signed by Curt Jews MD on 03/01/2019 at 6:27:25 PM.    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  There were no evidence of vegetations on the mitral valve but clearly he had them since he has septic embolization to the lungs with cavitary pathology and now loculated pneumothorax concerning for empyema he also has a pulmonary embolism. He is sp Chest TUBE placement He does also have a small  pericardial effusion.  #1 Metastatic MSSAB as described:       [facility] Antimicrobial Management Team Staphylococcus aureus bacteremia   Staphylococcus aureus bacteremia (SAB) is associated with a high rate of complications and mortality.  Specific aspects of clinical management are critical to optimizing the outcome of patients  with SAB.  Therefore, the [facility] Health Antimicrobial Management Team [facility]) has initiated an intervention aimed at improving the management of SAB at [facility].  To do so, Infectious Diseases physicians are providing an evidence-based consult for the management of all patients with SAB.     Yes No Comments  Perform follow-up blood cultures (even if the patient is afebrile) to ensure clearance of bacteremia _0  _1  Repeated post central line removal  Remove vascular catheter and obtain follow-up blood cultures after the removal of the catheter _2  _3  CENTRA LINE out AVOID new central lines until we can prove sterilization of blood  Perform echocardiography to evaluate for endocarditis (transthoracic ECHO is 40-50% sensitive, TEE is > 90% sensitive) _4  _5  TEE negative but undoubtedly has simply thrown vegetqation into lungs and he has pericardial effusion  Consult electrophysiologist to evaluate implanted cardiac device (pacemaker, ICD) _6  _7    Ensure source control _8  _9  Need to monitor Neuro exam, CT tube in place but might need decortication, agree with repeat CT next week  Investigate for metastatic sites of infection _10  _11  Does the patient have ANY symptom or physical exam finding that would suggest a deeper infection (back or neck pain that may be suggestive of vertebral osteomyelitis or epidural abscess, muscle pain that could be a symptom of pyomyositis)?  Keep in mind that for deep seeded infections MRI imaging with contrast is preferred rather than other often insensitive tests such as plain x-rays, especially early in a patient's presentation.  Change  antibiotic therapy to cefazolin _12  _13  Beta-lactam antibiotics are preferred for MSSA due to higher cure rates.   If on Vancomycin, goal trough should be 15 - 20 mcg/mL  Estimated duration of IV antibiotic therapy:  6-8 weeks but would need to be int he hospital _14  _15  Consult case management for probably prolonged outpatient IV antibiotic therapy   #2 #2 Lumbar diskitis/vertebral osteomyelitis: monitor Neuro exam and continue antibiotics  #3  Loculated effusion with pneumothorax  MSSA looks to be growing .Agree with repeat CT. 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  I will follow his culture data closely and I am available for questions this weekend.  LOS: 9 days   Alcide Evener 03/03/2019, 10:12 AM

## 2019-03-03 NOTE — Progress Notes (Signed)
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  Ongoing, being addressed via oral nutrition supplements  GOAL:   Patient will meet greater than or equal to 90% of their needs  Progressing  MONITOR:   Diet advancement, Supplement acceptance, Weight trends, Skin, I & O's, Labs  REASON FOR ASSESSMENT:   Consult Assessment of nutrition requirement/status  ASSESSMENT:   48 y.o. male with medical history of IVDU, ongoing heroin use. Patient presented to the ED on 10/27 with 3 week history of progressively worsening back pain and BLE weakness.  Denies any urinary retention but has noticed some bowel incontinence. Stated that he has had increasing trouble walking and that he has lost weight. Last use of heroin was today (10/27).  10/28 - intubated 10/31 - extubated 11/03 - TEE 11/04 - s/p chest tube insertion for empyema and pneumothorax  Pt with MSSA cavitary pneumonia secondary to septic pulmonary emboli and subsequent large pneumothoraces and empyema on left now s/p ultrasound-guided tube thoracostomy.  No meal completions recorded in chart since admission.  Reviewed weights since admission. Weight had been stable at 139 lbs from 10/29 to 11/03. On 11/04, weight recorded as 118 lbs. Question accuracy given such a significant weight loss over the course of 1 day.  Spoke with pt at bedside. Pt sleeping at time of RD visit but awoke to RD voice. Pt reports that his appetite is improving. Noted  breakfast meal tray at bedside. Pt had eaten ~50% of an English muffin. When asked about this, pt reports that this is his second breakfast tray and that he completed 100% of his first breakfast tray and ordered another because he was hungry.  Also noted 100% completed Ensure Enlive at bedside. Pt states that he has been drinking 2 daily. Pt declines RD offer to order three supplements daily. RD will add Pro-stat to pt's supplement regimen.  Medications reviewed and include: Ensure Enlive BID, SSI, MVI with minerals, Protonix, IV abx  Labs reviewed: hemoglobin 7.8 CBG's: 93-138 x 24 hours  UOP: 1100 ml x 24 hours CT: 640 ml x 24 hours I/O's: +698 ml since admit  Diet Order:   Diet Order            Diet regular Room service appropriate? Yes; Fluid consistency: Thin  Diet effective now              EDUCATION NEEDS:   No education needs have been identified at this time  Skin:  Skin Assessment: Skin Integrity Issues: Skin Integrity Issues: Stage I: sacrum  Last BM:  03/02/19 type 4 (rectal tube removed)  Height:   Ht Readings from Last 1 Encounters:  02/28/19 6' (1.829 m)    Weight:   Wt Readings from Last 1 Encounters:  03/03/19 53.9 kg    Ideal Body Weight:  80.9 kg  BMI:  Body mass index is 16.12 kg/m.  Estimated Nutritional Needs:   Kcal:  2100-2300  Protein:  100-120 grams  Fluid:  >/= 2.0 L    Gaynell Face,  MS, RD, LDN Inpatient Clinical Dietitian Pager: [phone] Weekend/After Hours: [phone]

## 2019-03-03 NOTE — Progress Notes (Signed)
PT Cancellation Note  Patient Details Name: Franklin Anderson MRN: [id number] DOB: 10-Jan-1971   Cancelled Treatment:     RN reported pt will be unable to paerticipate today due to pain level and chest tube.  Will attempt another day.  Pt has been evaluated.   Rica Koyanagi  PTA Acute  Rehabilitation Services Pager      [phone] Office      [phone]

## 2019-03-03 NOTE — Progress Notes (Addendum)
PROGRESS NOTE    Franklin Anderson  JME:[id number]  DOB: 1970-05-27  DOA: 02/21/2019 PCP: Mack Hook, MD  Brief Narrative:  48 y.o. male with medical history significant of rheumatic fever, HTN, ongoing IV heroin abuse, presented to the ED on 10/27 with 3 week course of progressively worsening back pain and BLE weakness associated with some bowel incontinence and trouble walking. Last use of heroin was on the day of admission. ED Course: Found to have:sepsis with Tm 100, HR 126, RR 31, WBC 22.4k,AKI with BUN 111 and creat 1.7. Imaging studies revealed osteomyelitis and diskitis of L4-L5,septic pulmonary emboli and B psoas abscesses. Patient recieved 2L NS bolus, put on cefepime / vanc / flagyl initially, admitted to [facility] for Mx of sepsis and associated metabolic encephalopathy.2x Blood culture 10/27>> MSSA. Patient became more obtunded and dyspneic on 10/28 , PCCM consulted-intubated --extubated on 10/31. Patient underwent R psoas abscess aspiration on 10/28. Wound culture>> MSSA.  Respiratory culture from 10/28 also growing MSSA.  Echo revealed Grade 1 diastolic dysfunction, preserved EF and possible mitral valve vegetation. TEE recommended. CXR suggestive of septic emboli and MRI head showed abnormal dural enhancement. ID/Cardiology following. HC additionally complicated by hypernatremia, persistent uremia, pericardial effusion (did not look too bad on bedside US 10/29 per PCCM) and severe deconditioning. Abx course--Cefepime10/28 >> 10/29 ,Vanc  10/28 >> 10/30, Cefazolin 10/30>>now 11/3: Patient underwent TEE and had acute respiratory distress later that evening with stat chest x-ray showing loculated pneumothorax/CT chest indicating empyema/PE/hydropneumothorax.  Now seen by PCCM and post chest tube placement.Code status changed to DNR.   Subjective: Patient resting comfortably and reports feeling good.  Blood pressure improved overall but still on the high side.  He states feet swelling  improved with support stockings.  Chest tube to drainage and tolerating well.  He is requesting increase in pain medication regimen.  PCCM has ordered IV Toradol.   Objective: Vitals:   03/03/19 1100 03/03/19 1200 03/03/19 1300 03/03/19 1319  BP:  (!) 153/100    Pulse: (!) 101 (!) 102 95 97  Resp: (!) 21 (!) 23 (!) 21 (!) 26  Temp:  98.8 F (37.1 C)    TempSrc:  Oral    SpO2: 97% 96% 96% 96%  Weight:      Height:        Intake/Output Summary (Last 24 hours) at 03/03/2019 1401 Last data filed at 03/03/2019 0643 Gross per 24 hour  Intake 272.9 ml  Output 1465 ml  Net -1192.1 ml   Filed Weights   03/01/19 0428 03/02/19 0430 03/03/19 0500  Weight: 53.9 kg 53.9 kg 53.9 kg    Physical Examination:  General exam: Appears comfortable now and in no respiratory distress Respiratory system: Decreased breath sounds at bases, conducted sounds/rhonchi on LACW. Respiratory effort normal.  Chest tube to left side with serosanguineous drainage Cardiovascular system: S1 & S2 heard,tachycardic. No JVD, murmurs, rubs, gallops or clicks. No pedal edema. Gastrointestinal system: Abdomen is nondistended, soft and nontender. No organomegaly or masses felt. Normal bowel sounds heard. Central nervous system: Alert and oriented. No focal neurological deficits. Extremities: Symmetric 5 x 5 power.  Puffy feet noted yesterday but improved today  Skin: No rashes, lesions or ulcers Psychiatry: Judgement and insight appear normal. Mood & affect irritable due to n.p.o. status    Data Reviewed: I have personally reviewed following labs and imaging studies  CBC: Recent Labs  Lab 02/26/19 0222 02/27/19 0538 02/28/19 0432 02/28/19 0910 02/28/19 2023 03/01/19 0418 03/02/19 0500  WBC 11.1* 12.3* 9.4  --   --  10.3 7.9  HGB 7.5* 7.8* 7.1* 7.9* 9.4* 8.1* 7.7*  HCT 24.9* 25.3* 23.4* 25.6* 29.6* 26.5* 25.0*  MCV 92.6 92.0 92.5  --   --  91.1 90.9  PLT 243 252 182  --   --  189 458   Basic Metabolic  Panel: Recent Labs  Lab 02/25/19 0440 02/26/19 0222  02/27/19 0538 02/28/19 0432 03/01/19 0418 03/02/19 0500 03/03/19 1218  NA 148*  --    < > 141 138 140 138 136  K 4.0  --    < > 4.0 4.3 3.9 3.9 3.4*  CL 113*  --    < > 109 107 107 105 107  CO2 27  --    < > _0 GLUCOSE 138*  --    < > 107* 104* 131* 106* 137*  BUN 64*  --    < > 48* 34* 30* 27* 25*  CREATININE 0.61  --    < > 0.53* 0.43* 0.48* 0.47* 0.44*  CALCIUM 8.1*  --    < > 8.1* 7.9* 7.7* 7.7* 7.4*  MG 2.5* 2.1  --  1.7  --   --   --   --    < > = values in this interval not displayed.   GFR: Estimated Creatinine Clearance: 86.1 mL/min (A) (by C-G formula based on SCr of 0.44 mg/dL (L)). Liver Function Tests: Recent Labs  Lab 02/25/19 0440  AST 23  ALT 28  ALKPHOS 81  BILITOT 0.6  PROT 6.4*  ALBUMIN 1.4*   No results for input(s): LIPASE, AMYLASE in the last 168 hours. No results for input(s): AMMONIA in the last 168 hours. Coagulation Profile: Recent Labs  Lab 02/28/19 2331  INR 1.4*   Cardiac Enzymes: No results for input(s): CKTOTAL, CKMB, CKMBINDEX, TROPONINI in the last 168 hours. BNP (last 3 results) No results for input(s): PROBNP in the last 8760 hours. HbA1C: No results for input(s): HGBA1C in the last 72 hours. CBG: Recent Labs  Lab 03/02/19 1135 03/02/19 1644 03/02/19 2132 03/03/19 0735 03/03/19 1137  GLUCAP 138* 105* 107* 93 177*   Lipid Profile: No results for input(s): CHOL, HDL, LDLCALC, TRIG, CHOLHDL, LDLDIRECT in the last 72 hours. Thyroid Function Tests: No results for input(s): TSH, T4TOTAL, FREET4, T3FREE, THYROIDAB in the last 72 hours. Anemia Panel: No results for input(s): VITAMINB12, FOLATE, FERRITIN, TIBC, IRON, RETICCTPCT in the last 72 hours. Sepsis Labs: No results for input(s): PROCALCITON, LATICACIDVEN in the last 168 hours.  Recent Results (from the past 240 hour(s))  Blood Culture (routine x 2)     Status: Abnormal   Collection Time: 02/21/19   8:13 PM   Specimen: BLOOD LEFT FOREARM  Result Value Ref Range Status   Specimen Description   Final    BLOOD LEFT FOREARM Performed at [facility], [address], [place], [place] [address]    Special Requests   Final    BOTTLES DRAWN AEROBIC AND ANAEROBIC Blood Culture results may not be optimal due to an inadequate volume of blood received in culture bottles Performed at [facility] [address], [place], [place] [address]    Culture  Setup Time   Final    GRAM POSITIVE COCCI IN BOTH AEROBIC AND ANAEROBIC BOTTLES CRITICAL RESULT CALLED TO, READ BACK BY AND VERIFIED WITH: PHARMD J LEGGE [id number] AT 1337 BY CM CRITICAL RESULT CALLED TO,  READ BACK BY AND VERIFIED WITH: PHARMD E JACKSON [id number] AT 1357 BY CM Performed at [facility], [place] [address], [place], [place] [address]    Culture STAPHYLOCOCCUS AUREUS (A)  Final   Report Status 02/24/2019 FINAL  Final   Organism ID, Bacteria STAPHYLOCOCCUS AUREUS  Final      Susceptibility   Staphylococcus aureus - MIC*    CIPROFLOXACIN <=0.5 SENSITIVE Sensitive     ERYTHROMYCIN >=8 RESISTANT Resistant     GENTAMICIN <=0.5 SENSITIVE Sensitive     OXACILLIN 0.5 SENSITIVE Sensitive     TETRACYCLINE <=1 SENSITIVE Sensitive     VANCOMYCIN 1 SENSITIVE Sensitive     TRIMETH/SULFA <=10 SENSITIVE Sensitive     CLINDAMYCIN <=0.25 SENSITIVE Sensitive     RIFAMPIN <=0.5 SENSITIVE Sensitive     Inducible Clindamycin NEGATIVE Sensitive     * STAPHYLOCOCCUS AUREUS  SARS Coronavirus 2 by RT PCR (hospital order, performed in [facility] hospital lab) Nasopharyngeal Nasopharyngeal Swab     Status: None   Collection Time: 02/21/19  8:16 PM   Specimen: Nasopharyngeal Swab  Result Value Ref Range Status   SARS Coronavirus 2 NEGATIVE NEGATIVE Final    Comment: (NOTE) If result is NEGATIVE SARS-CoV-2 target nucleic acids are NOT DETECTED. The SARS-CoV-2 RNA is generally detectable in upper and lower  respiratory specimens  during the acute phase of infection. The lowest  concentration of SARS-CoV-2 viral copies this assay can detect is 250  copies / mL. A negative result does not preclude SARS-CoV-2 infection  and should not be used as the sole basis for treatment or other  patient management decisions.  A negative result may occur with  improper specimen collection / handling, submission of specimen other  than nasopharyngeal swab, presence of viral mutation(s) within the  areas targeted by this assay, and inadequate number of viral copies  (<250 copies / mL). A negative result must be combined with clinical  observations, patient history, and epidemiological information. If result is POSITIVE SARS-CoV-2 target nucleic acids are DETECTED. The SARS-CoV-2 RNA is generally detectable in upper and lower  respiratory specimens dur ing the acute phase of infection.  Positive  results are indicative of active infection with SARS-CoV-2.  Clinical  correlation with patient history and other diagnostic information is  necessary to determine patient infection status.  Positive results do  not rule out bacterial infection or co-infection with other viruses. If result is PRESUMPTIVE POSTIVE SARS-CoV-2 nucleic acids MAY BE PRESENT.   A presumptive positive result was obtained on the submitted specimen  and confirmed on repeat testing.  While 2019 novel coronavirus  (SARS-CoV-2) nucleic acids may be present in the submitted sample  additional confirmatory testing may be necessary for epidemiological  and / or clinical management purposes  to differentiate between  SARS-CoV-2 and other Sarbecovirus currently known to infect humans.  If clinically indicated additional testing with an alternate test  methodology [phone]) is advised. The SARS-CoV-2 RNA is generally  detectable in upper and lower respiratory sp ecimens during the acute  phase of infection. The expected result is Negative. Fact Sheet for Patients:   StrictlyIdeas.no Fact Sheet for Healthcare Providers: BankingDealers.co.za This test is not yet approved or cleared by the [place] FDA and has been authorized for detection and/or diagnosis of SARS-CoV-2 by FDA under an Emergency Use Authorization (EUA).  This EUA will remain in effect (meaning this test can be used) for the duration of the COVID-19 declaration under Section 564(b)(1) of  the Act, 21 U.S.C. section 360bbb-3(b)(1), unless the authorization is terminated or revoked sooner. Performed at [facility], [place] [address], [place], [place] [address]   MRSA PCR Screening     Status: None   Collection Time: 02/22/19  4:56 AM   Specimen: Nasal Mucosa; Nasopharyngeal  Result Value Ref Range Status   MRSA by PCR NEGATIVE NEGATIVE Final    Comment:        The GeneXpert MRSA Assay (FDA approved for NASAL specimens only), is one component of a comprehensive MRSA colonization surveillance program. It is not intended to diagnose MRSA infection nor to guide or monitor treatment for MRSA infections. Performed at [facility], [place] [address] [place], [place] [address]   Blood Culture (routine x 2)     Status: Abnormal   Collection Time: 02/22/19  5:33 AM   Specimen: BLOOD  Result Value Ref Range Status   Specimen Description   Final    BLOOD LEFT ANTECUBITAL Performed at [facility] [address], [place], [place] [address]    Special Requests   Final    BOTTLES DRAWN AEROBIC AND ANAEROBIC Blood Culture adequate volume Performed at [facility] [address], [place], [place] [address]    Culture  Setup Time   Final    GRAM POSITIVE COCCI ANAEROBIC BOTTLE ONLY CRITICAL RESULT CALLED TO, READ BACK BY AND VERIFIED WITH: PHARMD E JACKSON [id number] AT 1108 BY CM    Culture (A)  Final    STAPHYLOCOCCUS AUREUS SUSCEPTIBILITIES PERFORMED ON PREVIOUS  CULTURE WITHIN THE LAST 5 DAYS. Performed at [facility], [place] [address], [place], [place] [address]    Report Status 02/24/2019 FINAL  Final  Aerobic/Anaerobic Culture (surgical/deep wound)     Status: None   Collection Time: 02/22/19 10:05 AM   Specimen: Abscess  Result Value Ref Range Status   Specimen Description   Final    ABSCESS RT PSOAS Performed at [facility] [address], [place], [place] [address]    Special Requests   Final    NONE Performed at [facility], [place] [address], [place], [place] [address]    Gram Stain   Final    RARE WBC PRESENT,BOTH PMN AND MONONUCLEAR FEW GRAM POSITIVE COCCI IN PAIRS    Culture   Final    MODERATE STAPHYLOCOCCUS AUREUS NO ANAEROBES ISOLATED Performed at [facility], [place] [address] [place], [place] [address]    Report Status 02/27/2019 FINAL  Final   Organism ID, Bacteria STAPHYLOCOCCUS AUREUS  Final      Susceptibility   Staphylococcus aureus - MIC*    CIPROFLOXACIN <=0.5 SENSITIVE Sensitive     ERYTHROMYCIN >=8 RESISTANT Resistant     GENTAMICIN <=0.5 SENSITIVE Sensitive     OXACILLIN 0.5 SENSITIVE Sensitive     TETRACYCLINE <=1 SENSITIVE Sensitive     VANCOMYCIN <=0.5 SENSITIVE Sensitive     TRIMETH/SULFA <=10 SENSITIVE Sensitive     CLINDAMYCIN <=0.25 SENSITIVE Sensitive     RIFAMPIN <=0.5 SENSITIVE Sensitive     Inducible Clindamycin NEGATIVE Sensitive     * MODERATE STAPHYLOCOCCUS AUREUS  Culture, respiratory (non-expectorated)     Status: None   Collection Time: 02/22/19  5:30 PM   Specimen: Bronchoalveolar Lavage; Respiratory  Result Value Ref Range Status   Specimen Description   Final    BRONCHIAL ALVEOLAR LAVAGE Performed at [facility] [address] [place]  [place], [place] [address]    Special Requests   Final    NONE Performed at [facility], [place] [address], [place], [place] [address]    Gram Stain   Final     ABUNDANT WBC PRESENT, PREDOMINANTLY PMN MODERATE GRAM POSITIVE COCCI Performed at [facility], [place] [address], [place], [place] [address]    Culture MODERATE STAPHYLOCOCCUS AUREUS  Final   Report Status 02/25/2019 FINAL  Final   Organism ID, Bacteria STAPHYLOCOCCUS AUREUS  Final      Susceptibility   Staphylococcus aureus - MIC*    CIPROFLOXACIN <=0.5 SENSITIVE Sensitive     ERYTHROMYCIN >=8 RESISTANT Resistant     GENTAMICIN <=0.5 SENSITIVE Sensitive     OXACILLIN 0.5 SENSITIVE Sensitive     TETRACYCLINE <=1 SENSITIVE Sensitive     VANCOMYCIN 1 SENSITIVE Sensitive     TRIMETH/SULFA <=10 SENSITIVE Sensitive     CLINDAMYCIN <=0.25 SENSITIVE Sensitive     RIFAMPIN <=0.5 SENSITIVE Sensitive     Inducible Clindamycin NEGATIVE Sensitive     * MODERATE STAPHYLOCOCCUS AUREUS  Culture, blood (Routine X 2) w Reflex to ID Panel     Status: None   Collection Time: 02/24/19 11:47 AM   Specimen: BLOOD LEFT HAND  Result Value Ref Range Status   Specimen Description   Final    BLOOD LEFT HAND Performed at [facility] [address], [place], [place] [address]    Special Requests   Final    BOTTLES DRAWN AEROBIC AND ANAEROBIC Blood Culture adequate volume Performed at [facility] [address], [place], [place] [address]    Culture   Final    NO GROWTH 5 DAYS Performed at [facility], [place] [address] [place], [place] [address]    Report Status 03/01/2019 FINAL  Final  Body fluid culture     Status: None (Preliminary result)   Collection Time: 03/01/19  1:30 PM   Specimen: Chest; Body Fluid  Result Value Ref Range Status   Specimen Description   Final    CHEST Performed at [facility] [address] [place], [place] [address]    Special Requests   Final    Normal Performed at [facility], [place] [address] [place], [place] [address]    Gram Stain   Final    MODERATE WBC PRESENT,  PREDOMINANTLY PMN FEW GRAM POSITIVE COCCI IN CLUSTERS    Culture   Final    CULTURE REINCUBATED FOR BETTER GROWTH Performed at [facility], [place] [address] [place], [place] [address]    Report Status PENDING  Incomplete  Culture, blood (routine x 2)     Status: None (Preliminary result)   Collection Time: 03/02/19  7:10 PM   Specimen: BLOOD RIGHT ARM  Result Value Ref Range Status   Specimen Description   Final    BLOOD RIGHT ARM Performed at [facility], [place] [address] [place], [place] [address]    Special Requests   Final    BOTTLES DRAWN AEROBIC AND ANAEROBIC Blood Culture adequate volume Performed at [facility] [address], [place], [place] [address]    Culture   Final    NO GROWTH < 24 HOURS Performed at [facility] [address] [place], [place] [address]    Report Status PENDING  Incomplete  Culture, blood (routine x 2)     Status: None (Preliminary  result)   Collection Time: 03/02/19  7:17 PM   Specimen: BLOOD LEFT ARM  Result Value Ref Range Status   Specimen Description   Final    BLOOD LEFT ARM Performed at [facility], [address], [place], [place] [address]    Special Requests   Final    BOTTLES DRAWN AEROBIC ONLY Blood Culture adequate volume Performed at [facility] [address] [place], [place] [address]    Culture   Final    NO GROWTH < 24 HOURS Performed at [facility] [address], [place], [place] [address]    Report Status PENDING  Incomplete      Radiology Studies: Dg Chest Port 1 View  Result Date: 03/03/2019 CLINICAL DATA:  Left-sided pneumothorax. EXAM: PORTABLE CHEST 1 VIEW COMPARISON:  03/02/2019.  CT FINDINGS: Left chest tube in stable position. Small left pneumothorax unchanged. Multiple bilateral cavitary pulmonary nodules consistent with septic emboli again noted. These have progressed in number and size from prior exam. Heart size normal. No  acute bony abnormality. IMPRESSION: 1. Left chest tube in stable position. Stable small left pneumothorax. 2. Multiple cavitary pulmonary nodules consistent with septic emboli are again noted. These have progressed in number and size from prior exam. Electronically Signed   By: [place]   On: 03/03/2019 10:04   Dg Chest Port 1 View  Result Date: 03/02/2019 CLINICAL DATA:  Follow-up pleural effusion. EXAM: PORTABLE CHEST 1 VIEW COMPARISON:  Chest x-ray 03/01/2019 and chest CT 02/28/2019 FINDINGS: The cardiac silhouette, mediastinal and hilar contours are stable. Stable left IJ central venous catheter. The left-sided pleural drainage catheter is in stable position. Interval decrease in size of the left-sided pneumothorax. Stable numerous cavitary lesions in both lungs consistent with septic emboli. IMPRESSION: Left chest tube in good position with interval decrease in size of the left-sided pneumothorax. Stable bilateral septic emboli and infiltrates. Electronically Signed   By: Marijo Sanes M.D.   On: 03/02/2019 08:23        Scheduled Meds: . amLODipine  10 mg Oral Daily  . Chlorhexidine Gluconate Cloth  6 each Topical Daily  . feeding supplement (ENSURE ENLIVE)  237 mL Oral BID BM  . feeding supplement (PRO-STAT SUGAR FREE 64)  30 mL Oral BID  . insulin aspart  0-24 Units Subcutaneous TID AC & HS  . ketorolac  15 mg Intravenous Q6H  . mouth rinse  15 mL Mouth Rinse BID  . metoprolol tartrate  50 mg Oral BID  . multivitamin with minerals  1 tablet Oral Daily  . pantoprazole  40 mg Oral Daily  . sodium chloride flush  10-40 mL Intracatheter Q12H   Continuous Infusions: .  ceFAZolin (ANCEF) IV Stopped (03/03/19 1413)  . sodium chloride      Assessment & Plan:    1. Acute hypoxic respiratory failure/ respiratory distress on 11/3 with CT findings of Empyema: Patient had AMS, dyspnea, increased secretions on presentation needing intubation-subsequently extubated and had been  saturating well on 3 L nasal cannula.Respiratory cultures grew MSSA.Chest x-ray reported multiple pulmonary cavities/septic emboli with left lower lobe pneumonia and small pleural effusion. Patient underwent TEE on 11/3 at [facility], noted to be tachypneic few hours after returning but saturating 99% on 3 lits. Stat CXR was obtained and after d/w cardiology CT chest was also ordered. CXR showed loculated pneumothorax with mediastinal shift/rotation. PCCM was re-consulted--CT chest later reported "Features of widespread septic emboli with more central pulmonary  arterial filling defects seen within the left lower lobe, positive for acute PE with CT evidence of right heart strain.No visible pneumomediastinum. Irregular visceral pleural thickening involving the left lung with a large left hydropneumothorax, concerning for empyema".  Patient underwent chest tube/ drainage of empyema by PCCM on 11/4--sent for cultures-gram-positive cocci in clusters reported so far-follow-up final ID. Seen by Dr. Tommy Medal who recommended to continue current abx. Cardiology recommended discontinuing heparin drip that was started for PE as likely representative of septic emboli.  Repeat CXR in am and repeat CT at some point per PCCM.   2.MSSA bacteremia/sepsis with discitis/vertebral osteomyelitis/psoas abscesses/ septic pulmonary emboli/cavitatory lesions and now with empyema/ RV mural vegatation: in the setting of IV drug abuse. Blood cultures/psoas cultures and respiratory cultures growing MSSA.  Repeat blood cultures from 10/30 x 1 negative so far.Continue IV cefazolin.  Leukocytosis 12-13 K.  Afebrile.  Will plan on PICC line once cleared by ID. PCCM recommends discontinuing central line as soon as possible. Patient has poor peripheral IV access due to drug abuse history.     3.  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  Cultures growing MSSA.  Continue antibiotics as above.   Patient been c/o back pain (in the setting of discitis/osteomyelitis as well) and asking to increase pain medications for better control.  IV Toradol ordered by PCCM.  Monitor hemoglobin and renal function.  4.  Hypernatremia: Now improved sodium levels with IV hydration.  Eating and drinking well.    5.  AKI, uremia: Much improved with IV hydration.  BUN peaked to 118 during this hospital course and now down to 40s.  Mental status improved.    6.  Pericardial effusion: Not very concerning on bedside ultrasonogram per PCCM.  Reported to be mild-moderate on TEE  without any signs of tamponade.  Not sure if related to uremic pericarditis versus reactive to infection. Off IVF now  7.Metabolic encephalopathy: Present on admission in the setting of problem #1, dehydration/AKI/uremia and IV drug use. Improved.  Awake alert oriented x3 now.  MRI head showed abnormal dural enhancement.  Patient could have had meningoencephalitis on presentation.    8.Uncontrolled hypertension: Increase Norvasc and added Metoprolol 59m bid, titrated up to 50 mg today . Continue hydralazine PRN.  Patient had labetalol as needed available.  9.  Normocytic anemia: Could be anemia of chronic disease versus hemolytic anemia in the setting of endocarditis.  Schistocytes on peripheral smear.  Off rectal tube now,no melena..Iron studies show iron level at 50, ferritin at 630, TIBC at 133, %sat 38.  Added LDH/transferin. He received 1 unit PRBC on 11/3.  Post transfusion hemoglobin improved transiently to 9 but downtrending again now. Monitor and transfuse as needed.  10.  Severe protein calorie malnutrition/cachexia/hypoalbuminemia: Albumin level less than 2 and cachectic in the setting of severe systemic infection/chronic IV drug abuse.  Low albumin level likely causing puffy feet and effusions.  Support stockings and protein supplements  11.  Generalized muscle weakness/deconditioning: PT evaluation when off chest tube  DVT  prophylaxis: Heparin subcu Code Status: DNR now --Patient understands grave prognosis but states God saved him for a reason and is hopeful of recovery.  Family / Patient Communication: Discussed with patient /PCCM.  Consulted palliative care Disposition Plan: TBD.      LOS: 9 days    Time spent: 35 minutes    NGuilford Shi MD [facility] Pager [phone] If 7PM-7AM,  please contact night-coverage [email or web address] Password [email or web address] 03/03/2019, 2:01 PM

## 2019-03-03 NOTE — Progress Notes (Addendum)
Called to bedside to assess acute worsening of shortness of breath.  STAT CXR was reviewed. It does appear that the left PTX is larger than when comparing the AM film. On my arrival the patient stated that "things had settled down".  -I assessed the chest tube. He had 1-2 out of 7 airleak, radiographically the chest tube remains in good position AND the tube itself is intact.  -I increased the sxn back to 30 cmH2O. In looking at the standing care orders it had some conflicting orders re: 20 or 30 cmH2O, I had wanted him on 30 (I have cleared this up).   -as he feels better AND the volume of the PTX is actually better than it was 48 hrs ago I will hold off on placing a second chest tube. I am worried that, as it is ...his BPF will take a long time,  if at all to heal. If persists or worsens we could consider placing a second large bore CT but w/ airleak at 2 out of 7 I don't think we need to do that currently.  -I wonder if he is intermittently rupturing his cavitary lesions causing intermittent worsening of PTX ?   Plan for now Cont CT to 30 cm sxn Repeat CXR in am Hold off on second [place] [facility] [place] Pager # [phone] OR # [phone] if no answer

## 2019-03-04 ENCOUNTER — Inpatient Hospital Stay (HOSPITAL_COMMUNITY): Payer: Self-pay

## 2019-03-04 LAB — CBC
HCT: 26.1 % — ABNORMAL LOW (ref 39.0–52.0)
Hemoglobin: 8.1 g/dL — ABNORMAL LOW (ref 13.0–17.0)
MCH: 28.6 pg (ref 26.0–34.0)
MCHC: 31 g/dL (ref 30.0–36.0)
MCV: 92.2 fL (ref 80.0–100.0)
Platelets: 205 10*3/uL (ref 150–400)
RBC: 2.83 MIL/uL — ABNORMAL LOW (ref 4.22–5.81)
RDW: 16.8 % — ABNORMAL HIGH (ref 11.5–15.5)
WBC: 9 10*3/uL (ref 4.0–10.5)
nRBC: 0 % (ref 0.0–0.2)

## 2019-03-04 LAB — BODY FLUID CULTURE: Special Requests: NORMAL

## 2019-03-04 LAB — GLUCOSE, CAPILLARY
Glucose-Capillary: 89 mg/dL (ref 70–99)
Glucose-Capillary: 125 mg/dL — ABNORMAL HIGH (ref 70–99)
Glucose-Capillary: 127 mg/dL — ABNORMAL HIGH (ref 70–99)
Glucose-Capillary: 174 mg/dL — ABNORMAL HIGH (ref 70–99)

## 2019-03-04 IMAGING — DX DG CHEST 1V PORT
1 series · 1 of 1 positions shown · non-contrast
Comparison: Chest radiograph [DATE].

CLINICAL DATA: Pneumothorax

EXAM:
PORTABLE CHEST 1 VIEW

[chest ap]
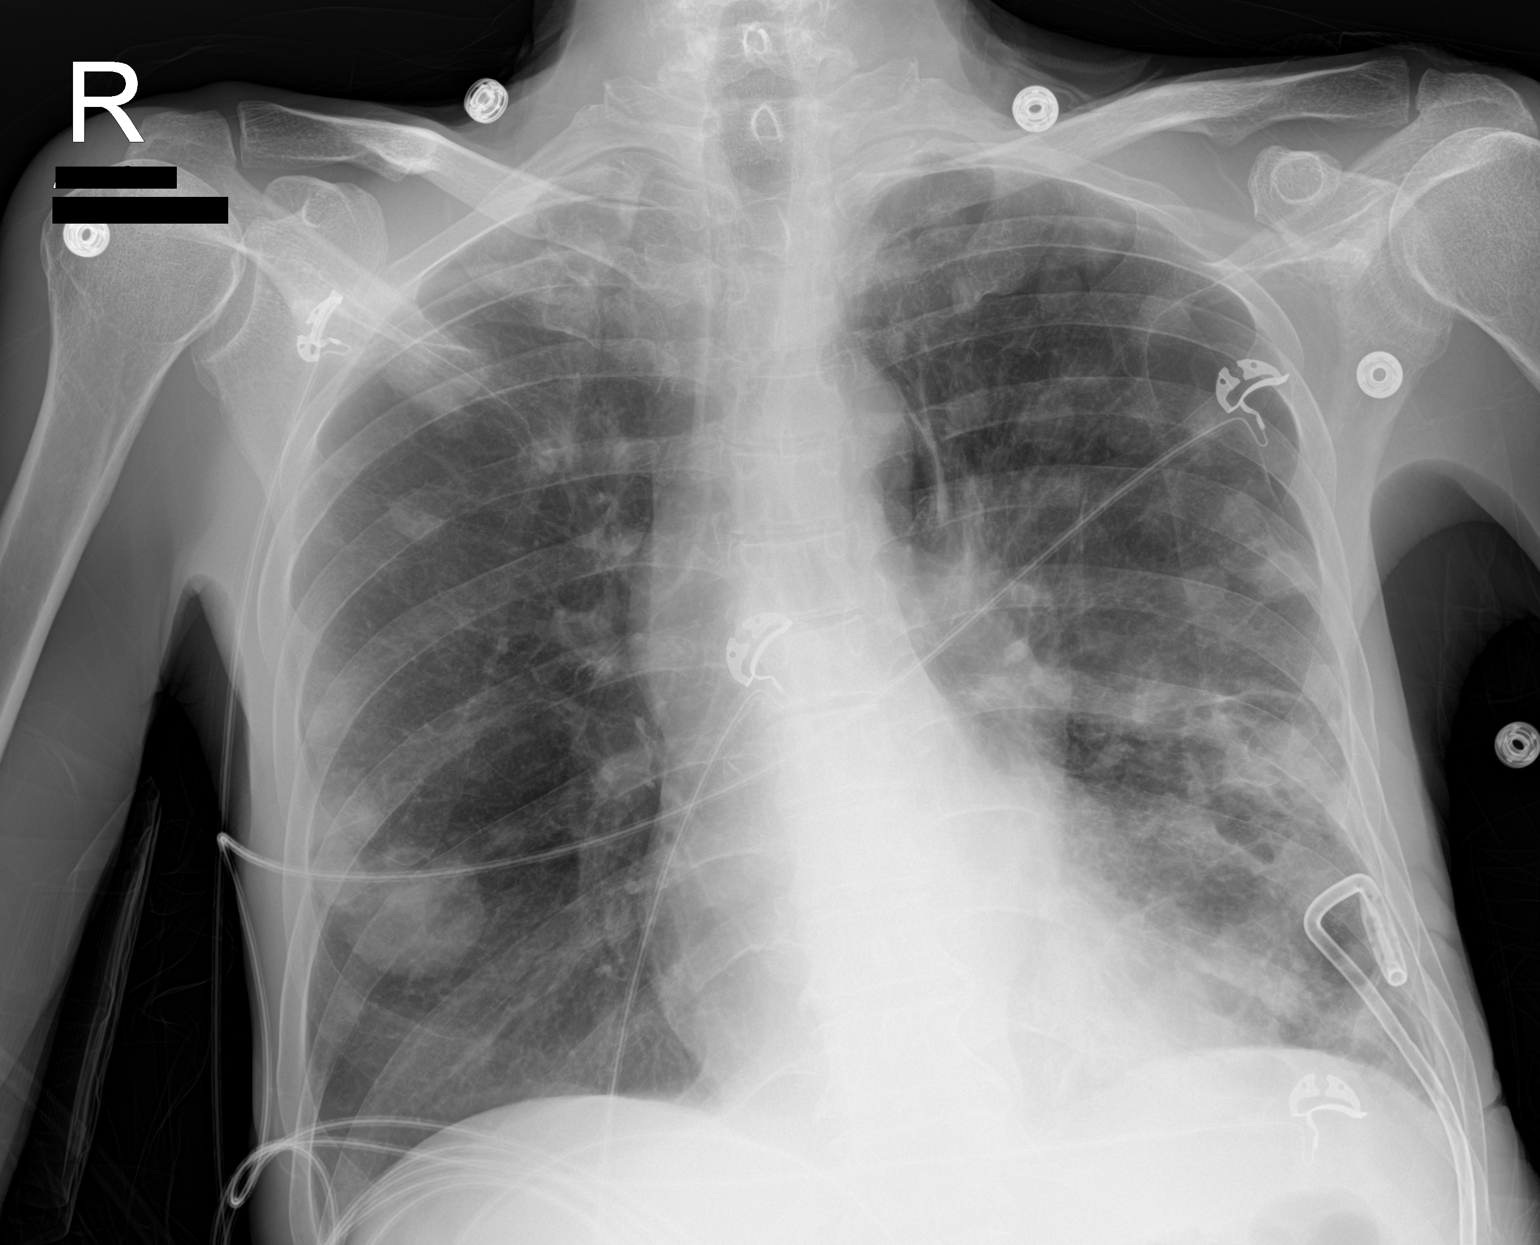

[1 of 1 positions shown; findings below may reference images not displayed]

FINDINGS: Monitoring leads overlie the patient. Stable cardiac and mediastinal
contours. Scattered nodules demonstrated throughout the lungs
bilaterally. Similar heterogeneous opacities left lung base. Left
chest tube remains in position. Similar small pneumothorax within
the upper lateral left hemithorax. Thoracic spine degenerative
changes.
IMPRESSION: 1. Similar small pneumothorax within the upper lateral left
hemithorax with chest tube in place.
2. Scattered nodules throughout the lungs bilaterally.
3. Similar heterogeneous opacities left lung base.

## 2019-03-04 NOTE — Progress Notes (Signed)
[facility]                                                                              Patient Demographics  Franklin Anderson, is a 48 y.o. male, DOB - [birth year], OVF:[id number]  Admit date - 02/21/2019   Admitting Physician Etta Quill, DO  Outpatient Primary MD for the patient is Mack Hook, MD  Outpatient specialists:   LOS - 10  days   Medical records reviewed and are as summarized below:    Chief Complaint  Patient presents with   Back Pain   Heroin Detox   Weight Loss       Brief summary   48 y.o.malewith medical history significant of rheumatic fever, HTN, ongoingIV heroin abuse, presented to the ED on 10/27 with 3 week course of progressively worsening back pain and BLE weakness associated with some bowel incontinence and trouble walking. Last use of heroin was on the day of admission. ED Course:Found to have:sepsis with Tm 100, HR 126, RR 31, WBC 22.4k,AKI with BUN 111 and creat 1.7. Imaging studies revealed osteomyelitis and diskitis of L4-L5,septic pulmonary emboli and B psoas abscesses. Patient recieved 2L NS bolus, put on cefepime / vanc / flagyl initially, admitted to [facility] for Mx of sepsis and associated metabolic encephalopathy.  Imaging studies revealed osteomyelitis and discitis of L4-L5, septic pulmonary emboli, psoas abscess.  Blood cultures 10/27> MSSA.  On 10/28 was found to be obtunded and dyspneic and was intubated by critical care, extubated on 10/31.  Right psoas abscess was aspirated on 10/28, wound care > MSSA.  Respiratory culture from 10/28 also grew MSSA. 2D echo revealed grade 1 diastolic dysfunction, possible mitral valve vegetation.  MRI head also showed abnormal dural enhancement.  Additional comorbidities included hyper natremia, persistent uremia, pericardial effusion, severe deconditioning. 11/3: Patient underwent TEE and had acute respiratory distress later that evening with stat chest x-ray showing loculated  pneumothorax/CT chest indicating empyema/PE/hydropneumothorax.  Now seen by PCCM and post chest tube placement.Code status changed to DNR  Assessment & Plan    Principal Problem: Acute hypoxic respiratory failure, acute respiratory distress on 11/3 with CT findings of empyema -Extubated since 10/31.  Respiratory cultures grew MSSA. -2D echo showed possible mitral valve vegetation, underwent TEE on 11/3, noted to be in respiratory distress. -Chest x-ray showed loculated pneumothorax with mediastinal shift.  CCM was reconsulted.  CT chest showed widespread septic emboli with more central pulmonary artery filling defects seen within the left lower lobe, positive for acute PE with CT evidence of right heart strain.  No pneumo mediastinum.  Left large hydropneumothorax, concerning for empyema. -Patient underwent chest tube/drainage of empyema by CCM on 11/4, cultures >MSSA. -ID following, recommended continue current antibiotics.  -  Cardiology recommended discontinuing heparin drip that was started for PE as likely representative of septic emboli -On 11/6, acute worsening of shortness of breath, chest x-ray showed left pneumothorax larger, increase the suction back to 30 cm H2O by CCM, following closely.  Currently comfortable.  Active Problems:  MSSA bacteremia/sepsis with discitis/vertebral osteomyelitis/psoas abscess/septic pulmonary emboli/cavitary lesion, empyema, mural vegetation -In the setting of IV drug abuse.  Blood cultures, so was cultures and respiratory cultures growing MSSA. -Continue IV cefazolin, ID following. -will plan on PICC line once cleared by ID.  Poor peripheral IV access due to drug abuse history.   Psoas abscess -Status post CT-guided right psoas aspiration by IR on 10/28 yielding 18 mL of purulent fluid, cultures grew MSSA -Pain currently controlled, on IV Toradol, continue antibiotics  Pericardial effusion -Reported to be mild to moderate on TEE without any signs  of tamponade.  Hypernatremia Sodium level now improved, tolerating diet.  Sodium 148 on 10/31  AKI, uremia -On admission, creatinine 1.7 with lactic acidosis 2.1 -Currently creatinine improved, mental status close to baseline  Acute metabolic encephalopathy -In the setting of sepsis, widespread septic emboli,  Uremia -Currently improved  Normocytic anemia -Possibly anemia of chronic disease versus hemolytic in the setting of endocarditis.  H&H currently stable  Uncontrolled hypertension -Continue metoprolol, Norvasc, hydralazine IV as needed with parameters    Pressure injury Sacrum with stage I, wound care per nursing  Severe protein calorie malnutrition, hypoalbuminemia, generalized deconditioning -Albumin level 1.4 on 10/31, will recheck  Code Status: DNR status DVT Prophylaxis: Heparin subcu Family Communication: Discussed all imaging results, lab results, explained to the patient   Disposition Plan: Continue monitoring in stepdown unit, high risk of deterioration  Time Spent in minutes 45 minutes  Procedures:    Consultants:   Cardiology CCM Infectious disease Palliative care  Antimicrobials:   Anti-infectives (From admission, onward)   Start     Dose/Rate Route Frequency Ordered Stop   02/24/19 2200  vancomycin (VANCOCIN) IVPB 1000 mg/200 mL premix  Status:  Discontinued     1,000 mg 200 mL/hr over 60 Minutes Intravenous Every 12 hours 02/24/19 1010 02/24/19 1253   02/24/19 1600  ceFAZolin (ANCEF) IVPB 2g/100 mL premix     2 g 200 mL/hr over 30 Minutes Intravenous Every 8 hours 02/24/19 1253     02/23/19 2200  vancomycin (VANCOCIN) IVPB 750 mg/150 ml premix  Status:  Discontinued     750 mg 150 mL/hr over 60 Minutes Intravenous Every 24 hours 02/23/19 0853 02/23/19 0909   02/23/19 2200  vancomycin (VANCOCIN) IVPB 750 mg/150 ml premix  Status:  Discontinued     750 mg 150 mL/hr over 60 Minutes Intravenous Every 12 hours 02/23/19 0909 02/24/19 1010    02/22/19 2200  vancomycin (VANCOCIN) IVPB 750 mg/150 ml premix  Status:  Discontinued     750 mg 150 mL/hr over 60 Minutes Intravenous Every 24 hours 02/22/19 0826 02/22/19 0827   02/22/19 2200  vancomycin (VANCOCIN) IVPB 1000 mg/200 mL premix  Status:  Discontinued     1,000 mg 200 mL/hr over 60 Minutes Intravenous Every 24 hours 02/22/19 0827 02/23/19 0853   02/22/19 0900  ceFEPIme (MAXIPIME) 2 g in sodium chloride 0.9 % 100 mL IVPB  Status:  Discontinued     2 g 200 mL/hr over 30 Minutes Intravenous Every 12 hours 02/22/19 0826 02/23/19 1135   02/21/19 2015  ceFEPIme (MAXIPIME) 2 g in sodium chloride 0.9 % 100 mL IVPB     2 g 200 mL/hr over 30 Minutes Intravenous  Once 02/21/19 2013 02/22/19 0031   02/21/19 2015  metroNIDAZOLE (FLAGYL) IVPB 500 mg     500 mg 100 mL/hr over 60 Minutes Intravenous  Once 02/21/19 2013 02/22/19 0031   02/21/19 2015  vancomycin (VANCOCIN) IVPB 1000 mg/200 mL premix     1,000 mg 200 mL/hr over 60 Minutes Intravenous  Once 02/21/19  2013 02/22/19 0200          Medications  Scheduled Meds:  amLODipine  10 mg Oral Daily   Chlorhexidine Gluconate Cloth  6 each Topical Daily   feeding supplement (ENSURE ENLIVE)  237 mL Oral BID BM   feeding supplement (PRO-STAT SUGAR FREE 64)  30 mL Oral BID   insulin aspart  0-24 Units Subcutaneous TID AC & HS   mouth rinse  15 mL Mouth Rinse BID   metoprolol tartrate  50 mg Oral BID   multivitamin with minerals  1 tablet Oral Daily   pantoprazole  40 mg Oral Daily   sodium chloride flush  10-40 mL Intracatheter Q12H   Continuous Infusions:   ceFAZolin (ANCEF) IV Stopped (03/04/19 0601)   sodium chloride     PRN Meds:.acetaminophen **OR** acetaminophen, acetaminophen, clonazePAM, hydrALAZINE, HYDROmorphone (DILAUDID) injection, ipratropium-albuterol, ondansetron **OR** ondansetron (ZOFRAN) IV, oxyCODONE-acetaminophen, senna-docusate, sodium chloride flush      Subjective:   Lindell Renfrew was seen  and examined today.  Currently states pain in the back and at the area of the chest tube.  Otherwise no acute issues, no fevers or chills. Patient denies dizziness, shortness of breath, abdominal pain, N/V/D/C, new weakness, numbess, tingling. No acute events overnight.    Objective:   Vitals:   03/04/19 0500 03/04/19 0600 03/04/19 0800 03/04/19 1000  BP:  (!) 145/93 (!) 164/116 (!) 146/107  Pulse: 100 94 100 (!) 107  Resp: 18 19 (!) 22 20  Temp:   98.4 F (36.9 C)   TempSrc:   Oral   SpO2: 97% 95% 97% 96%  Weight: 53.9 kg     Height:        Intake/Output Summary (Last 24 hours) at 03/04/2019 1148 Last data filed at 03/04/2019 1133 Gross per 24 hour  Intake 324.34 ml  Output 1410 ml  Net -1085.66 ml     Wt Readings from Last 3 Encounters:  03/04/19 53.9 kg  12/21/17 65.3 kg  04/22/16 68 kg     Exam  General: Alert and oriented x 3, NAD  Eyes:   HEENT:  Atraumatic, normocephalic, normal oropharynx  Cardiovascular: S1 S2 auscultated, no murmurs, RRR  Respiratory: Clear to auscultation bilaterally, no wheezing, rales or rhonchi, chest tube to the left  Gastrointestinal: Soft, nontender, nondistended, + bowel sounds  Ext: no pedal edema bilaterally  Neuro: No new deficits  Musculoskeletal: No digital cyanosis, clubbing  Skin: No rashes  Psych: Normal affect and demeanor, alert and oriented x3    Data Reviewed:  I have personally reviewed following labs and imaging studies  Micro Results Recent Results (from the past 240 hour(s))  Culture, respiratory (non-expectorated)     Status: None   Collection Time: 02/22/19  5:30 PM   Specimen: Bronchoalveolar Lavage; Respiratory  Result Value Ref Range Status   Specimen Description   Final    BRONCHIAL ALVEOLAR LAVAGE Performed at [facility] [address] [place], [place] [address]    Special Requests   Final    NONE Performed at [facility], [place] [address]  [place], [place] [address]    Gram Stain   Final    ABUNDANT WBC PRESENT, PREDOMINANTLY PMN MODERATE GRAM POSITIVE COCCI Performed at [facility], [place] [address], [place], [place] [address]    Culture MODERATE STAPHYLOCOCCUS AUREUS  Final   Report Status 02/25/2019 FINAL  Final   Organism ID, Bacteria STAPHYLOCOCCUS AUREUS  Final  Susceptibility   Staphylococcus aureus - MIC*    CIPROFLOXACIN <=0.5 SENSITIVE Sensitive     ERYTHROMYCIN >=8 RESISTANT Resistant     GENTAMICIN <=0.5 SENSITIVE Sensitive     OXACILLIN 0.5 SENSITIVE Sensitive     TETRACYCLINE <=1 SENSITIVE Sensitive     VANCOMYCIN 1 SENSITIVE Sensitive     TRIMETH/SULFA <=10 SENSITIVE Sensitive     CLINDAMYCIN <=0.25 SENSITIVE Sensitive     RIFAMPIN <=0.5 SENSITIVE Sensitive     Inducible Clindamycin NEGATIVE Sensitive     * MODERATE STAPHYLOCOCCUS AUREUS  Culture, blood (Routine X 2) w Reflex to ID Panel     Status: None   Collection Time: 02/24/19 11:47 AM   Specimen: BLOOD LEFT HAND  Result Value Ref Range Status   Specimen Description   Final    BLOOD LEFT HAND Performed at [place] [address] [place], [place] [address]    Special Requests   Final    BOTTLES DRAWN AEROBIC AND ANAEROBIC Blood Culture adequate volume Performed at [facility] [address], [place], [place] [address]    Culture   Final    NO GROWTH 5 DAYS Performed at [facility], [place] [address], [place], [place] [address]    Report Status 03/01/2019 FINAL  Final  Body fluid culture     Status: None   Collection Time: 03/01/19  1:30 PM   Specimen: Chest; Body Fluid  Result Value Ref Range Status   Specimen Description   Final    CHEST Performed at [facility] [address] [place], [place] [address]    Special Requests   Final    Normal Performed at [facility], [place] [address], [place], [place] [address]    Gram Stain   Final     MODERATE WBC PRESENT, PREDOMINANTLY PMN FEW GRAM POSITIVE COCCI IN CLUSTERS Performed at [facility], [place] [address], [place], [place] [address]    Culture RARE STAPHYLOCOCCUS AUREUS  Final   Report Status 03/04/2019 FINAL  Final   Organism ID, Bacteria STAPHYLOCOCCUS AUREUS  Final      Susceptibility   Staphylococcus aureus - MIC*    CIPROFLOXACIN <=0.5 SENSITIVE Sensitive     ERYTHROMYCIN >=8 RESISTANT Resistant     GENTAMICIN <=0.5 SENSITIVE Sensitive     OXACILLIN 0.5 SENSITIVE Sensitive     TETRACYCLINE <=1 SENSITIVE Sensitive     VANCOMYCIN <=0.5 SENSITIVE Sensitive     TRIMETH/SULFA <=10 SENSITIVE Sensitive     CLINDAMYCIN <=0.25 SENSITIVE Sensitive     RIFAMPIN <=0.5 SENSITIVE Sensitive     Inducible Clindamycin NEGATIVE Sensitive     * RARE STAPHYLOCOCCUS AUREUS  Culture, blood (routine x 2)     Status: None (Preliminary result)   Collection Time: 03/02/19  7:10 PM   Specimen: BLOOD RIGHT ARM  Result Value Ref Range Status   Specimen Description   Final    BLOOD RIGHT ARM Performed at [facility], [address] [place], [place] [address]    Special Requests   Final    BOTTLES DRAWN AEROBIC AND ANAEROBIC Blood Culture adequate volume Performed at [facility] [address], [place], [place] [address]    Culture   Final    NO GROWTH 2 DAYS Performed at [facility] [address], [place], [place] [address]    Report Status PENDING  Incomplete  Culture, blood (routine x 2)  Status: None (Preliminary result)   Collection Time: 03/02/19  7:17 PM   Specimen: BLOOD LEFT ARM  Result Value Ref Range Status   Specimen Description   Final    BLOOD LEFT ARM Performed at [facility], [address], [place], [place] [address]    Special Requests   Final    BOTTLES DRAWN AEROBIC ONLY Blood Culture adequate volume Performed at [place] [address], [place], [place] [address]    Culture   Final     NO GROWTH 2 DAYS Performed at [facility] [address] [place], [place] [address]    Report Status PENDING  Incomplete    Radiology Reports Dg Skull 1-3 Views  Result Date: 02/21/2019 CLINICAL DATA:  48 year old male with MRI clearance. EXAM: SKULL - 1-3 VIEW COMPARISON:  None. FINDINGS: There is no evidence of skull fracture or other focal bone lesions. There is opacification of the right frontal sinus. Nasal and tongue piercings noted. No other radiopaque foreign object identified. IMPRESSION: 1. Nose ring and tongue piercing. No other radiopaque/metallic foreign object. 2. No evidence of skull fracture or other focal bone lesions. 3. Opacification of the right frontal sinus. . Electronically Signed   By: Anner Crete M.D.   On: 02/21/2019 22:21   Dg Chest 1 View  Result Date: 03/03/2019 CLINICAL DATA:  48 year old male with increased shortness of breath EXAM: CHEST  1 VIEW COMPARISON:  Multiple prior comparison is most recently 03/03/2019, dating to 02/28/2019 FINDINGS: Cardiomediastinal silhouette unchanged in size and contour. Nodules throughout the lungs again noted, compatible with known septic emboli. Unchanged position of thoracostomy tube at the left lung base. The small pneumothorax at the lateral left lung appears larger than the comparison plain film. Similar appearance of coarsened interstitial markings throughout. IMPRESSION: Unchanged thoracostomy tube. The small left pneumothorax appears slightly larger than the comparison plain film though overall has been improving over the comparison chest x-rays dating to 02/28/2019. Multiple pulmonary nodules compatible with known septic emboli. Electronically Signed   By: Corrie Mckusick D.O.   On: 03/03/2019 14:42   Dg Chest 1 View  Result Date: 02/22/2019 CLINICAL DATA:  Status post central line placement EXAM: CHEST  1 VIEW COMPARISON:  02/22/2019,, 02/21/2019, 10/01/2014 FINDINGS: Interval intubation, tip of the  endotracheal tube is about 3.8 cm superior to the carina. Esophageal tube tip is below the diaphragm but non included. Left IJ central venous catheter tip over the distal SVC. No left pneumothorax. Bilateral cavitary lesions. Progressive consolidation at the left lung base. Stable cardiomediastinal silhouette. IMPRESSION: 1. Support lines and tubes as above. Left-sided central venous catheter tip over the distal SVC. No left pneumothorax 2. Worsening consolidation at the left lung base. 3. Multiple bilateral cavitary lesions which may reflect septic emboli Electronically Signed   By: Donavan Foil M.D.   On: 02/22/2019 19:31   Dg Abd 1 View  Result Date: 02/21/2019 CLINICAL DATA:  Back pain, progressive neuro deficit, MR clearance EXAM: ABDOMEN - 1 VIEW COMPARISON:  None. FINDINGS: Cardiac monitoring leads overlie the pelvis. No surgical hardware or metallic foreign body is identified. There is a paucity of upper abdominal bowel gas, nonspecific. Patchy opacities are present in the left lung base with likely trace left effusion. Degenerative changes in the spine and hips. IMPRESSION: 1. No surgical hardware or metallic foreign body is identified. 2. Patchy opacities in the left lung base, suspicious for pneumonia. Trace left effusion. 3. Nonspecific paucity of  upper abdominal bowel gas. Electronically Signed   By: Lovena Le M.D.   On: 02/21/2019 22:18   Ct Head Wo Contrast  Result Date: 02/22/2019 CLINICAL DATA:  Encephalopathy EXAM: CT HEAD WITHOUT CONTRAST TECHNIQUE: Contiguous axial images were obtained from the base of the skull through the vertex without intravenous contrast. COMPARISON:  None. FINDINGS: Brain: There is no mass, hemorrhage or extra-axial collection. The size and configuration of the ventricles and extra-axial CSF spaces are normal. The brain parenchyma is normal, without acute or chronic infarction. Vascular: No abnormal hyperdensity of the major intracranial arteries or dural  venous sinuses. No intracranial atherosclerosis. Skull: The visualized skull base, calvarium and extracranial soft tissues are normal. Sinuses/Orbits: No fluid levels or advanced mucosal thickening of the visualized paranasal sinuses. No mastoid or middle ear effusion. The orbits are normal. IMPRESSION: Normal head CT. Electronically Signed   By: Ulyses Jarred M.D.   On: 02/22/2019 02:36   Ct Angio Chest Pe W Or Wo Contrast  Addendum Date: 02/28/2019   ADDENDUM REPORT: 02/28/2019 22:19 ADDENDUM: Minimal mediastinal shift. Results and addendum were called by telephone at the time of interpretation on 02/28/2019 at 10:19 pm to provider NP Baltazar Najjar , who verbally acknowledged these results. Electronically Signed   By: Lovena Le M.D.   On: 02/28/2019 22:19   Result Date: 02/28/2019 CLINICAL DATA:  Increasing shortness of breath post transesophageal ECHO EXAM: CT ANGIOGRAPHY CHEST WITH CONTRAST TECHNIQUE: Multidetector CT imaging of the chest was performed using the standard protocol during bolus administration of intravenous contrast. Multiplanar CT image reconstructions and MIPs were obtained to evaluate the vascular anatomy. CONTRAST:  148m OMNIPAQUE IOHEXOL 350 MG/ML SOLN COMPARISON:  Same-day radiograph FINDINGS: Cardiovascular: Satisfactory opacification the pulmonary arteries to the segmental level. Segmental and subsegmental pulmonary artery emboli are noted throughout the left lower lobe, lingula and with the lobar, segmental and subsegmental arteries of the right upper lobe. Suspect more extensive embolic burden however given the numerous cavitary lesions throughout the lungs compatible with septic emboli. Flattening of the intraventricular septum and mild elevation of the RV LV ratio (0.95) concerning for right heart strain. Cardiac size remains within normal limits. Few coronary artery calcifications are present. No sizable pericardial effusion though difficult to discern given increased attenuation of  the mediastinal fat. Mediastinum/Nodes: Diffusely increased attenuation of the mediastinal fat suggesting soft tissue edema. Prominent mediastinal and hilar adenopathy, likely reactive. No acute abnormality of the trachea or esophagus. Thyroid gland and thoracic inlet are unremarkable. Lungs/Pleura: There are numerous rounded and cavitary fluid-filled lesions throughout the lungs suspicious for septic pulmonary emboli. Some interspersed regions of ground-glass attenuation and tree-in-bud nodularity noted in the left lung base. There is irregular visceral pleural thickening involving the left lung with a large left hydropneumothorax with dependently layering simple attenuation fluid. Small right pleural effusion present as well with adjacent passive atelectasis. Upper Abdomen: Nodular hepatic surface contour. Moderate volume upper abdominal ascites. Musculoskeletal: Nonunited subacute to remote lateral left tenth rib fracture.No acute osseous abnormality or suspicious osseous lesion. Paucity of subcutaneous fat. Body wall edema. Review of the MIP images confirms the above findings. IMPRESSION: 1. Features of widespread septic emboli with more central pulmonary arterial filling defects seen within the left lower lobe, positive for acute PE with CT evidence of right heart strain (RV/LV Ratio = 0.95) consistent with at least submassive (intermediate risk) PE. The presence of right heart strain has been associated with an increased risk of morbidity and mortality. Please activate Code PE  by paging [phone]. 2. No visible pneumomediastinum. 3. Irregular visceral pleural thickening involving the left lung with a large left hydropneumothorax, concerning for empyema. 4. Small right pleural effusion and adjacent passive atelectasis. 5. Nodular hepatic surface contour, consistent with cirrhosis. Moderate volume upper abdominal ascites. 6. Additional features of anasarca diffusely increased attenuation of the mediastinal  fat and circumferential body wall edema. 7. Aortic Atherosclerosis (ICD10-I70.0). Currently awaiting provider callback. Addendum will be submitted following contact with the ordering provider or care team. Electronically Signed: By: Lovena Le M.D. On: 02/28/2019 22:11   Mr Angio Head Wo Contrast  Result Date: 02/22/2019 CLINICAL DATA:  Encephalopathy, endocarditis and osteomyelitis EXAM: MRI HEAD WITHOUT CONTRAST MRA HEAD WITHOUT CONTRAST TECHNIQUE: Multiplanar, multiecho pulse sequences of the brain and surrounding structures were obtained without intravenous contrast. Angiographic images of the head were obtained using MRA technique without contrast. COMPARISON:  None. FINDINGS: Motion artifact is present. MRI HEAD FINDINGS Brain: There is no acute infarction or intracranial hemorrhage. There is no intracranial mass, mass effect, edema, or hydrocephalus. Thin pachymeningeal thickening is present. Vascular: Major vessel flow voids at the skull base are preserved. Skull and upper cervical spine: Marrow signal is within normal limits. Sinuses/Orbits: Minor paranasal sinus mucosal thickening. The orbits are unremarkable. Other: None. MRA HEAD FINDINGS Motion artifact present. There is preserved flow related enhancement of the intracranial internal carotid arteries and proximal middle and anterior cerebral arteries. Visualized intracranial vertebral arteries basilar artery, and proximal posterior cerebral arteries are also patent. There is fetal or near fetal origin of the left posterior cerebral artery. No gross aneurysm identified. IMPRESSION: Motion degraded study without evidence of septic emboli. Nonspecific thin dural thickening, which could be secondary to lumbar puncture if recently performed. Unremarkable proximal intracranial circulation. Electronically Signed   By: Macy Mis M.D.   On: 02/22/2019 12:17   Mr Brain Wo Contrast  Result Date: 02/22/2019 CLINICAL DATA:  Encephalopathy,  endocarditis and osteomyelitis EXAM: MRI HEAD WITHOUT CONTRAST MRA HEAD WITHOUT CONTRAST TECHNIQUE: Multiplanar, multiecho pulse sequences of the brain and surrounding structures were obtained without intravenous contrast. Angiographic images of the head were obtained using MRA technique without contrast. COMPARISON:  None. FINDINGS: Motion artifact is present. MRI HEAD FINDINGS Brain: There is no acute infarction or intracranial hemorrhage. There is no intracranial mass, mass effect, edema, or hydrocephalus. Thin pachymeningeal thickening is present. Vascular: Major vessel flow voids at the skull base are preserved. Skull and upper cervical spine: Marrow signal is within normal limits. Sinuses/Orbits: Minor paranasal sinus mucosal thickening. The orbits are unremarkable. Other: None. MRA HEAD FINDINGS Motion artifact present. There is preserved flow related enhancement of the intracranial internal carotid arteries and proximal middle and anterior cerebral arteries. Visualized intracranial vertebral arteries basilar artery, and proximal posterior cerebral arteries are also patent. There is fetal or near fetal origin of the left posterior cerebral artery. No gross aneurysm identified. IMPRESSION: Motion degraded study without evidence of septic emboli. Nonspecific thin dural thickening, which could be secondary to lumbar puncture if recently performed. Unremarkable proximal intracranial circulation. Electronically Signed   By: Macy Mis M.D.   On: 02/22/2019 12:17   Mr Thoracic Spine W Wo Contrast  Result Date: 02/21/2019 CLINICAL DATA:  Back pain with weakness. History of IV drug use. EXAM: MRI THORACIC AND LUMBAR SPINE WITHOUT AND WITH CONTRAST TECHNIQUE: Multiplanar and multiecho pulse sequences of the thoracic and lumbar spine were obtained without and with intravenous contrast. CONTRAST:  41m GADAVIST GADOBUTROL 1 MMOL/ML IV SOLN COMPARISON:  None. FINDINGS: MRI THORACIC SPINE FINDINGS Alignment:   Physiologic. Vertebrae: Hyperintense T2-weighted signal and contrast enhancement within the T1 vertebrae. Cord:  Normal Paraspinal and other soft tissues: There are multiple cavitary lesions within the left lung. There is a large cavitary lesion of the right lung apex. Mild contrast enhancement of the paraspinal tissues at T1. Disc levels: Small central disc protrusion at T8-9 with no spinal canal stenosis. MRI LUMBAR SPINE FINDINGS Segmentation:  Standard. Alignment:  Normal Vertebrae: There is hyperintense T2-weighted signal throughout the bone marrow of the L4 and L5 vertebral bodies, extending to the disc space. There is abnormal contrast enhancement of both vertebra. Bone marrow signal is otherwise normal throughout the lumbar spine. There is fluid within both L4-5 facet joints. Conus medullaris: Extends to the L2 level and appears normal. Paraspinal and other soft tissues: There are bilateral psoas muscle collections, measuring up to 2.6 cm on the right and 2.5 cm on the left. Disc levels: L1-2: Normal. L2-3: Intermediate disc bulge with mild facet hypertrophy. No stenosis. L3-4: Left eccentric disc bulge with mild left lateral recess narrowing. Mild left foraminal stenosis. No spinal canal stenosis. L4-5: Abnormal enhancement as above. The abnormal enhancement extends into both neural foramina. There is severe bilateral foraminal stenosis. No central spinal canal stenosis. There is mild contrast enhancement in the soft tissues adjacent to the facet joints. L5-S1: There is edema of the paraspinal soft tissues. No disc herniation or stenosis. IMPRESSION: 1. Abnormal enhancement of the L4 and L5 vertebral bodies and disc space, consistent with discitis osteomyelitis. There is severe bilateral foraminal stenosis at the L4-5 level with abnormal contrast enhancement extending into both neural foramina. 2. Bilateral psoas muscle abscesses, measuring up to 2.6 cm on the right and 2.5 cm on the left. Paraspinal soft  tissue inflammation at L4-5 with fluid in both L4-5 facet joints, which may be reactive or indicative of septic arthritis. 3. Abnormal signal and enhancement of the T1 vertebral body with surrounding paraspinal soft tissue enhancement, possibly indicating osteomyelitis. 4. Multiple cavitary lesions within both lungs, consistent with septic emboli. Electronically Signed   By: Ulyses Jarred M.D.   On: 02/21/2019 23:43   Mr Lumbar Spine W Wo Contrast (assess For Abscess, Cord Compression)  Result Date: 02/21/2019 CLINICAL DATA:  Back pain with weakness. History of IV drug use. EXAM: MRI THORACIC AND LUMBAR SPINE WITHOUT AND WITH CONTRAST TECHNIQUE: Multiplanar and multiecho pulse sequences of the thoracic and lumbar spine were obtained without and with intravenous contrast. CONTRAST:  110m GADAVIST GADOBUTROL 1 MMOL/ML IV SOLN COMPARISON:  None. FINDINGS: MRI THORACIC SPINE FINDINGS Alignment:  Physiologic. Vertebrae: Hyperintense T2-weighted signal and contrast enhancement within the T1 vertebrae. Cord:  Normal Paraspinal and other soft tissues: There are multiple cavitary lesions within the left lung. There is a large cavitary lesion of the right lung apex. Mild contrast enhancement of the paraspinal tissues at T1. Disc levels: Small central disc protrusion at T8-9 with no spinal canal stenosis. MRI LUMBAR SPINE FINDINGS Segmentation:  Standard. Alignment:  Normal Vertebrae: There is hyperintense T2-weighted signal throughout the bone marrow of the L4 and L5 vertebral bodies, extending to the disc space. There is abnormal contrast enhancement of both vertebra. Bone marrow signal is otherwise normal throughout the lumbar spine. There is fluid within both L4-5 facet joints. Conus medullaris: Extends to the L2 level and appears normal. Paraspinal and other soft tissues: There are bilateral psoas muscle collections, measuring up to 2.6 cm on the right and 2.5  cm on the left. Disc levels: L1-2: Normal. L2-3:  Intermediate disc bulge with mild facet hypertrophy. No stenosis. L3-4: Left eccentric disc bulge with mild left lateral recess narrowing. Mild left foraminal stenosis. No spinal canal stenosis. L4-5: Abnormal enhancement as above. The abnormal enhancement extends into both neural foramina. There is severe bilateral foraminal stenosis. No central spinal canal stenosis. There is mild contrast enhancement in the soft tissues adjacent to the facet joints. L5-S1: There is edema of the paraspinal soft tissues. No disc herniation or stenosis. IMPRESSION: 1. Abnormal enhancement of the L4 and L5 vertebral bodies and disc space, consistent with discitis osteomyelitis. There is severe bilateral foraminal stenosis at the L4-5 level with abnormal contrast enhancement extending into both neural foramina. 2. Bilateral psoas muscle abscesses, measuring up to 2.6 cm on the right and 2.5 cm on the left. Paraspinal soft tissue inflammation at L4-5 with fluid in both L4-5 facet joints, which may be reactive or indicative of septic arthritis. 3. Abnormal signal and enhancement of the T1 vertebral body with surrounding paraspinal soft tissue enhancement, possibly indicating osteomyelitis. 4. Multiple cavitary lesions within both lungs, consistent with septic emboli. Electronically Signed   By: Ulyses Jarred M.D.   On: 02/21/2019 23:43   US Renal  Result Date: 02/22/2019 CLINICAL DATA:  Acute renal injury EXAM: RENAL / URINARY TRACT ULTRASOUND COMPLETE COMPARISON:  None. FINDINGS: Right Kidney: Renal measurements: 11.2 x 6.2 x 5.9 cm. = volume: 213 mL. Mild hydronephrosis is noted. Mild increased echogenicity is seen. Left Kidney: Renal measurements: 13 x 5.8 x 5.7 cm = volume: 223 mL. Increased echogenicity is noted. Mild hydronephrosis is noted. Bladder: Bladder is well distended. Debris is noted within. Correlate with any possible UTI. Other: None IMPRESSION: Mild hydronephrosis is noted. This may be related to the distended  bladder. Mild debris within the bladder which may be related to an underlying UTI. Clinical correlation is recommended. Increased echogenicity is noted consistent with medical renal disease. Electronically Signed   By: Inez Catalina M.D.   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 The abscesses are currently too small for drain placement, however we can proceed with CT-guided aspiration to provide material for culture. EXAM: CT-guided aspiration MEDICATIONS: The patient is currently admitted to the hospital and receiving intravenous antibiotics. The antibiotics were administered within an appropriate time frame prior to the initiation of the procedure. ANESTHESIA/SEDATION: Fentanyl 100 mcg IV; Versed 2 mg IV Moderate Sedation Time:  15 minutes The patient was continuously monitored during the procedure by the interventional radiology nurse under my direct supervision. COMPLICATIONS: None immediate. PROCEDURE: Informed written consent was obtained from the patient after a thorough discussion of the procedural risks, benefits and alternatives. All questions were addressed. A timeout was performed prior to the initiation of the procedure. A planning axial CT scan was performed. The fluid collections within the right psoas muscle were successfully identified. A suitable skin entry site was selected and marked. The overlying skin was sterilely prepped and draped in the standard fashion using chlorhexidine skin prep. Local anesthesia was attained by infiltration with 1% lidocaine. A small dermatotomy was made. Under intermittent CT guidance, an 18 gauge trocar needle was advanced into the fluid collection. Aspiration was then performed yielding approximately 18 mL of purulent fluid. Samples were sent for Gram stain and culture. The trocar needle was removed. Post aspiration CT imaging  demonstrates no  evidence of hematoma or other immediate complication. IMPRESSION: Successful CT-guided aspiration of a right psoas abscess yielding 18 mL purulent fluid which was sent for culture. Signed, Criselda Peaches, MD, [facility] Electronically Signed   By: Jacqulynn Cadet M.D.   On: 02/22/2019 14:57   Dg Chest Port 1 View  Result Date: 03/04/2019 CLINICAL DATA:  Pneumothorax EXAM: PORTABLE CHEST 1 VIEW COMPARISON:  Chest radiograph 03/03/2019. FINDINGS: Monitoring leads overlie the patient. Stable cardiac and mediastinal contours. Scattered nodules demonstrated throughout the lungs bilaterally. Similar heterogeneous opacities left lung base. Left chest tube remains in position. Similar small pneumothorax within the upper lateral left hemithorax. Thoracic spine degenerative changes. IMPRESSION: 1. Similar small pneumothorax within the upper lateral left hemithorax with chest tube in place. 2. Scattered nodules throughout the lungs bilaterally. 3. Similar heterogeneous opacities left lung base. Electronically Signed   By: Lovey Newcomer M.D.   On: 03/04/2019 09:39   Dg Chest Port 1 View  Result Date: 03/03/2019 CLINICAL DATA:  Left-sided pneumothorax. EXAM: PORTABLE CHEST 1 VIEW COMPARISON:  03/02/2019.  CT FINDINGS: Left chest tube in stable position. Small left pneumothorax unchanged. Multiple bilateral cavitary pulmonary nodules consistent with septic emboli again noted. These have progressed in number and size from prior exam. Heart size normal. No acute bony abnormality. IMPRESSION: 1. Left chest tube in stable position. Stable small left pneumothorax. 2. Multiple cavitary pulmonary nodules consistent with septic emboli are again noted. These have progressed in number and size from prior exam. Electronically Signed   By: Tibbie   On: 03/03/2019 10:04   Dg Chest Port 1 View  Result Date: 03/02/2019 CLINICAL DATA:   Follow-up pleural effusion. EXAM: PORTABLE CHEST 1 VIEW COMPARISON:  Chest x-ray 03/01/2019 and chest CT 02/28/2019 FINDINGS: The cardiac silhouette, mediastinal and hilar contours are stable. Stable left IJ central venous catheter. The left-sided pleural drainage catheter is in stable position. Interval decrease in size of the left-sided pneumothorax. Stable numerous cavitary lesions in both lungs consistent with septic emboli. IMPRESSION: Left chest tube in good position with interval decrease in size of the left-sided pneumothorax. Stable bilateral septic emboli and infiltrates. Electronically Signed   By: Marijo Sanes M.D.   On: 03/02/2019 08:23   Dg Chest Port 1 View  Result Date: 03/01/2019 CLINICAL DATA:  48 year old male with a history of chest tube placement EXAM: PORTABLE CHEST 1 VIEW COMPARISON:  CT chest 02/28/2019, plain film 02/28/2019 FINDINGS: Cardiomediastinal silhouette is unchanged in size and contour. Unchanged left IJ central venous catheter with the tip appearing to terminate superior cavoatrial junction. Interval placement thoracostomy tube the left lung base. Components of pneumothorax in the upper lung persist with incomplete re-expansion of the lung. Opacity within the left mid and lower lung persists obscuring the left hemidiaphragm in the left heart border. Nodular opacities of the right lung persist. No right pneumothorax or right pleural effusion. IMPRESSION: Interval placement of left-sided thoracostomy tube at the lung base with incomplete re-expansion of the left lung and persisting pneumothorax/hydropneumothorax. Similar appearance of the lungs with multifocal septic emboli and consolidation/scarring at the left lung base. Unchanged left IJ central venous catheter. Electronically Signed   By: Corrie Mckusick D.O.   On: 03/01/2019 12:43   Dg Chest Port 1 View  Addendum Date: 02/28/2019   ADDENDUM REPORT: 02/28/2019 19:27 ADDENDUM: Critical Value/emergent results were called by  telephone at the time of interpretation on 02/28/2019 at 7:27 pm to providerKaren Baltazar Najjar, who  verbally acknowledged these results. Electronically Signed   By: Zetta Bills M.D.   On: 02/28/2019 19:27   Result Date: 02/28/2019 CLINICAL DATA:  Elevated heart rate and dyspnea. EXAM: PORTABLE CHEST 1 VIEW COMPARISON:  02/24/2019 FINDINGS: New loculated left pneumothorax in the setting of cavitary lung lesions. Moderately large pneumothorax and areas of collapse/consolidation in the left lung base also with sub pulmonic lucency and deep sulcus sign. Concomitant left pleural effusion is suspected. Cavitary lesions in the right chest are unchanged. Mild rotation with probable slight mediastinal shift. Post extubation with left IJ central venous catheter terminating at the caval to atrial junction as before. No acute bone process. IMPRESSION: 1. New loculated left pneumothorax in the setting of cavitary lung lesions. Mediastinal shift while slight is present on the current study. 2. Stable cavitary lesions in the right chest. 3. Post extubation. 4. A call is out to the referring provider to further discuss above findings. Electronically Signed: By: Zetta Bills M.D. On: 02/28/2019 19:19   Dg Chest Port 1 View  Result Date: 02/24/2019 CLINICAL DATA:  Endotracheal tube EXAM: PORTABLE CHEST 1 VIEW COMPARISON:  Two days ago FINDINGS: Endotracheal tube tip is just below the clavicular heads. Left IJ line with tip at the upper cavoatrial junction. The orogastric tube tip reaches the stomach. Bilateral cavitary pneumonia with small left pleural effusion. No convincing change from prior. No visible air leak. IMPRESSION: 1. Unremarkable hardware positioning. 2. Unchanged multiple pulmonary cavities with left lower lobe pneumonia and small pleural effusion, findings of septic emboli. Electronically Signed   By: Monte Fantasia M.D.   On: 02/24/2019 05:31   Dg Chest Port 1 View  Result Date: 02/22/2019 CLINICAL DATA:   Worsening dyspnea EXAM: PORTABLE CHEST 1 VIEW COMPARISON:  February 21, 2019 FINDINGS: The cardiomediastinal silhouette is unchanged from prior exam. There is worsening patchy airspace opacity at the left lung base. There are multiple rounded pulmonary nodules within the periphery of the right lower lung and left upper lung as on the prior exam. IMPRESSION: Worsening airspace opacity in the left lower lung which is concerning for pneumonia. Again noted are multiple bilateral rounded pulmonary nodular opacities throughout both lungs. This could be due to septic emboli. Electronically Signed   By: Prudencio Pair M.D.   On: 02/22/2019 15:54   Dg Chest Port 1 View  Result Date: 02/21/2019 CLINICAL DATA:  Shortness of breath EXAM: PORTABLE CHEST 1 VIEW COMPARISON:  None. FINDINGS: There is airspace opacity in the left lower lobe with small left pleural effusion consistent with pneumonia. Within this area, there is a nodular appearing area measuring 3.1 x 2.7 cm. There is a nodular opacity in the periphery of the left mid lung measuring 1.3 x 1.1 cm. There is a nodular opacity in the periphery of the right mid lung measuring 0.9 x 0.9 cm. There is an apparent cavitary lesion in the right upper lobe measuring 2.9 x 2.1 cm. There are smaller cavitary appearing lesions throughout the lungs bilaterally. Heart size and pulmonary vascularity are normal. No adenopathy. No bone lesions. IMPRESSION: 1.  Apparent pneumonia left base with small left pleural effusion. 2. Multiple nodular lesions, some of which appear cavitated. Question metastatic foci versus septic emboli. Advise contrast enhanced chest CT to further assess. No adenopathy appreciable by radiography.  Heart size normal. Electronically Signed   By: Lowella Grip III M.D.   On: 02/21/2019 20:50   Dg Abd Portable 1v  Result Date: 02/22/2019 CLINICAL DATA:  OG  tube placement EXAM: PORTABLE ABDOMEN - 1 VIEW COMPARISON:  02/21/2019 FINDINGS: Esophageal tube tip  overlies the mid gastric region, side-port in the region of the cardia. Small left-sided pleural effusion with airspace disease at the left base. Cavitary lung lesions. IMPRESSION: 1. Esophageal tube tip overlies the gastric body 2. Small left pleural effusion with airspace disease at the left base. Multiple cavitary lung lesions. Electronically Signed   By: Donavan Foil M.D.   On: 02/22/2019 19:33   Vas [place] Lower Extremity Venous (dvt)  Result Date: 03/01/2019  Lower Venous Study Indications: Pulmonary embolism.  Risk Factors: None identified. Comparison Study: No prior studies. Performing Technologist: Oliver Hum RVT  Examination Guidelines: A complete evaluation includes B-mode imaging, spectral Doppler, color Doppler, and power Doppler as needed of all accessible portions of each vessel. Bilateral testing is considered an integral part of a complete examination. Limited examinations for reoccurring indications may be performed as noted. The reflux portion of the exam is performed with the patient in reverse Trendelenburg.  +---------+---------------+---------+-----------+----------+--------------+  RIGHT     Compressibility Phasicity Spontaneity Properties Thrombus Aging  +---------+---------------+---------+-----------+----------+--------------+  CFV       Full            Yes       Yes                                    +---------+---------------+---------+-----------+----------+--------------+  SFJ       Full                                                             +---------+---------------+---------+-----------+----------+--------------+  FV Prox   Full                                                             +---------+---------------+---------+-----------+----------+--------------+  FV Mid    Full                                                             +---------+---------------+---------+-----------+----------+--------------+  FV Distal Full                                                              +---------+---------------+---------+-----------+----------+--------------+  PFV       Full                                                             +---------+---------------+---------+-----------+----------+--------------+  POP  Full            Yes       Yes                                    +---------+---------------+---------+-----------+----------+--------------+  PTV       Full                                                             +---------+---------------+---------+-----------+----------+--------------+  PERO      Full                                                             +---------+---------------+---------+-----------+----------+--------------+   +---------+---------------+---------+-----------+----------+--------------+  LEFT      Compressibility Phasicity Spontaneity Properties Thrombus Aging  +---------+---------------+---------+-----------+----------+--------------+  CFV       Full            Yes       Yes                                    +---------+---------------+---------+-----------+----------+--------------+  SFJ       Full                                                             +---------+---------------+---------+-----------+----------+--------------+  FV Prox   Full                                                             +---------+---------------+---------+-----------+----------+--------------+  FV Mid    Full                                                             +---------+---------------+---------+-----------+----------+--------------+  FV Distal Full                                                             +---------+---------------+---------+-----------+----------+--------------+  PFV       Full                                                             +---------+---------------+---------+-----------+----------+--------------+  POP       Full            Yes       Yes                                     +---------+---------------+---------+-----------+----------+--------------+  PTV       Full                                                             +---------+---------------+---------+-----------+----------+--------------+  PERO      Full                                                             +---------+---------------+---------+-----------+----------+--------------+     Summary: Right: No evidence of common femoral vein obstruction. There is no evidence of deep vein thrombosis in the lower extremity. No cystic structure found in the popliteal fossa. Left: There is no evidence of deep vein thrombosis in the lower extremity. No cystic structure found in the popliteal fossa.  *See table(s) above for measurements and observations. Electronically signed by Curt Jews MD on 03/01/2019 at 6:27:25 PM.    Final     Lab Data:  CBC: Recent Labs  Lab 02/27/19 0538 02/28/19 0432 02/28/19 0910 02/28/19 2023 03/01/19 0418 03/02/19 0500 03/04/19 0527  WBC 12.3* 9.4  --   --  10.3 7.9 9.0  HGB 7.8* 7.1* 7.9* 9.4* 8.1* 7.7* 8.1*  HCT 25.3* 23.4* 25.6* 29.6* 26.5* 25.0* 26.1*  MCV 92.0 92.5  --   --  91.1 90.9 92.2  PLT 252 182  --   --  189 170 694   Basic Metabolic Panel: Recent Labs  Lab 02/26/19 0222  02/27/19 0538 02/28/19 0432 03/01/19 0418 03/02/19 0500 03/03/19 1218  NA  --    < > 141 138 140 138 136  K  --    < > 4.0 4.3 3.9 3.9 3.4*  CL  --    < > 109 107 107 105 107  CO2  --    < > _0 GLUCOSE  --    < > 107* 104* 131* 106* 137*  BUN  --    < > 48* 34* 30* 27* 25*  CREATININE  --    < > 0.53* 0.43* 0.48* 0.47* 0.44*  CALCIUM  --    < > 8.1* 7.9* 7.7* 7.7* 7.4*  MG 2.1  --  1.7  --   --   --   --    < > = values in this interval not displayed.   GFR: Estimated Creatinine Clearance: 86.1 mL/min (A) (by C-G formula based on SCr of 0.44 mg/dL (L)). Liver Function Tests: No results for input(s): AST, ALT, ALKPHOS, BILITOT, PROT, ALBUMIN in the last 168 hours. No  results for input(s): LIPASE, AMYLASE in the last 168 hours. No results for input(s): AMMONIA in the last 168 hours. Coagulation Profile: Recent Labs  Lab 02/28/19 2331  INR 1.4*   Cardiac  Enzymes: No results for input(s): CKTOTAL, CKMB, CKMBINDEX, TROPONINI in the last 168 hours. BNP (last 3 results) No results for input(s): PROBNP in the last 8760 hours. HbA1C: No results for input(s): HGBA1C in the last 72 hours. CBG: Recent Labs  Lab 03/03/19 0735 03/03/19 1137 03/03/19 1639 03/03/19 2143 03/04/19 0749  GLUCAP 93 177* 125* 122* 89   Lipid Profile: No results for input(s): CHOL, HDL, LDLCALC, TRIG, CHOLHDL, LDLDIRECT in the last 72 hours. Thyroid Function Tests: No results for input(s): TSH, T4TOTAL, FREET4, T3FREE, THYROIDAB in the last 72 hours. Anemia Panel: No results for input(s): VITAMINB12, FOLATE, FERRITIN, TIBC, IRON, RETICCTPCT in the last 72 hours. Urine analysis:    Component Value Date/Time   BILIRUBINUR neg 12/21/2017 1551   PROTEINUR Negative 12/21/2017 1551   UROBILINOGEN 0.2 12/21/2017 1551   NITRITE neg 12/21/2017 1551   LEUKOCYTESUR Negative 12/21/2017 1551     Khai Torbert M.D. [facility] 03/04/2019, 11:48 AM  Pager: [phone] Between 7am to 7pm - call Pager - [phone]  After 7pm go to [email or web address] - password [email or web address]  Call night coverage person covering after 7pm

## 2019-03-05 LAB — GLUCOSE, CAPILLARY
Glucose-Capillary: 90 mg/dL (ref 70–99)
Glucose-Capillary: 170 mg/dL — ABNORMAL HIGH (ref 70–99)
Glucose-Capillary: 124 mg/dL — ABNORMAL HIGH (ref 70–99)
Glucose-Capillary: 110 mg/dL — ABNORMAL HIGH (ref 70–99)

## 2019-03-05 LAB — CBC
HCT: 34.2 % — ABNORMAL LOW (ref 39.0–52.0)
Hemoglobin: 10.8 g/dL — ABNORMAL LOW (ref 13.0–17.0)
MCH: 29 pg (ref 26.0–34.0)
MCHC: 31.6 g/dL (ref 30.0–36.0)
MCV: 91.9 fL (ref 80.0–100.0)
Platelets: 116 10*3/uL — ABNORMAL LOW (ref 150–400)
RBC: 3.72 MIL/uL — ABNORMAL LOW (ref 4.22–5.81)
RDW: 17.2 % — ABNORMAL HIGH (ref 11.5–15.5)
WBC: 10.5 10*3/uL (ref 4.0–10.5)
nRBC: 0 % (ref 0.0–0.2)

## 2019-03-05 LAB — BASIC METABOLIC PANEL
Anion gap: 7 (ref 5–15)
BUN: 16 mg/dL (ref 6–20)
CO2: 24 mmol/L (ref 22–32)
Calcium: 7.4 mg/dL — ABNORMAL LOW (ref 8.9–10.3)
Chloride: 105 mmol/L (ref 98–111)
Creatinine, Ser: 0.43 mg/dL — ABNORMAL LOW (ref 0.61–1.24)
GFR calc Af Amer: >60 mL/min (ref >60)
GFR calc non Af Amer: >60 mL/min (ref >60)
Glucose, Bld: 158 mg/dL — ABNORMAL HIGH (ref 70–99)
Potassium: 3.9 mmol/L (ref 3.5–5.1)
Sodium: 136 mmol/L (ref 135–145)

## 2019-03-05 MED ORDER — OXYCODONE HCL ER 10 MG PO T12A
10.0000 mg | EXTENDED_RELEASE_TABLET | Freq: Two times a day (BID) | ORAL | Status: DC
  Administered 2019-03-05 – 2019-03-10 (×12): 10 mg via ORAL
  Filled 2019-03-05 (×12): qty 1

## 2019-03-05 NOTE — Progress Notes (Signed)
Patient to transfer to 5E 1514 report given to receiving nurse, all questions answered at this time.  Pt. VSS with no s/s of distress noted.  Belongings sent with patient in wheelchair.

## 2019-03-05 NOTE — Progress Notes (Signed)
Subjective:  He feels much better today  Antibiotics:  Anti-infectives (From admission, onward)   Start     Dose/Rate Route Frequency Ordered Stop   02/24/19 2200  vancomycin (VANCOCIN) IVPB 1000 mg/200 mL premix  Status:  Discontinued     1,000 mg 200 mL/hr over 60 Minutes Intravenous Every 12 hours 02/24/19 1010 02/24/19 1253   02/24/19 1600  ceFAZolin (ANCEF) IVPB 2g/100 mL premix     2 g 200 mL/hr over 30 Minutes Intravenous Every 8 hours 02/24/19 1253     02/23/19 2200  vancomycin (VANCOCIN) IVPB 750 mg/150 ml premix  Status:  Discontinued     750 mg 150 mL/hr over 60 Minutes Intravenous Every 24 hours 02/23/19 0853 02/23/19 0909   02/23/19 2200  vancomycin (VANCOCIN) IVPB 750 mg/150 ml premix  Status:  Discontinued     750 mg 150 mL/hr over 60 Minutes Intravenous Every 12 hours 02/23/19 0909 02/24/19 1010   02/22/19 2200  vancomycin (VANCOCIN) IVPB 750 mg/150 ml premix  Status:  Discontinued     750 mg 150 mL/hr over 60 Minutes Intravenous Every 24 hours 02/22/19 0826 02/22/19 0827   02/22/19 2200  vancomycin (VANCOCIN) IVPB 1000 mg/200 mL premix  Status:  Discontinued     1,000 mg 200 mL/hr over 60 Minutes Intravenous Every 24 hours 02/22/19 0827 02/23/19 0853   02/22/19 0900  ceFEPIme (MAXIPIME) 2 g in sodium chloride 0.9 % 100 mL IVPB  Status:  Discontinued     2 g 200 mL/hr over 30 Minutes Intravenous Every 12 hours 02/22/19 0826 02/23/19 1135   02/21/19 2015  ceFEPIme (MAXIPIME) 2 g in sodium chloride 0.9 % 100 mL IVPB     2 g 200 mL/hr over 30 Minutes Intravenous  Once 02/21/19 2013 02/22/19 0031   02/21/19 2015  metroNIDAZOLE (FLAGYL) IVPB 500 mg     500 mg 100 mL/hr over 60 Minutes Intravenous  Once 02/21/19 2013 02/22/19 0031   02/21/19 2015  vancomycin (VANCOCIN) IVPB 1000 mg/200 mL premix     1,000 mg 200 mL/hr over 60 Minutes Intravenous  Once 02/21/19 2013 02/22/19 0200      Medications: Scheduled Meds: . amLODipine  10 mg Oral Daily  .  Chlorhexidine Gluconate Cloth  6 each Topical Daily  . feeding supplement (ENSURE ENLIVE)  237 mL Oral BID BM  . feeding supplement (PRO-STAT SUGAR FREE 64)  30 mL Oral BID  . insulin aspart  0-24 Units Subcutaneous TID AC & HS  . mouth rinse  15 mL Mouth Rinse BID  . metoprolol tartrate  50 mg Oral BID  . multivitamin with minerals  1 tablet Oral Daily  . oxyCODONE  10 mg Oral Q12H  . pantoprazole  40 mg Oral Daily  . sodium chloride flush  10-40 mL Intracatheter Q12H   Continuous Infusions: .  ceFAZolin (ANCEF) IV Stopped (03/05/19 2482)  . sodium chloride     PRN Meds:.acetaminophen **OR** acetaminophen, acetaminophen, clonazePAM, hydrALAZINE, HYDROmorphone (DILAUDID) injection, ipratropium-albuterol, ondansetron **OR** ondansetron (ZOFRAN) IV, oxyCODONE-acetaminophen, senna-docusate, sodium chloride flush    Objective: Weight change: 0.1 kg  Intake/Output Summary (Last 24 hours) at 03/05/2019 0942 Last data filed at 03/05/2019 0817 Gross per 24 hour  Intake 239.24 ml  Output 1390 ml  Net -1150.76 ml   Blood pressure (!) 153/107, pulse (!) 109, temperature 98.1 F (36.7 C), temperature source Oral, resp. rate 17, height 6' (1.829 m), weight 54 kg, SpO2 98 %. Temp:  [  98.1 F (36.7 C)-98.9 F (37.2 C)] 98.1 F (36.7 C) (11/08 0800) Pulse Rate:  [95-118] 109 (11/08 0825) Resp:  [17-27] 17 (11/08 0825) BP: (127-166)/(85-115) 153/107 (11/08 0825) SpO2:  [93 %-100 %] 98 % (11/08 0825) Weight:  [54 kg] 54 kg (11/08 0500)  Physical Exam: General: Alert and awake, oriented x3,cachectic, facial wasting, sitting up and eating breakfast HEENT: anicteric sclera, EOMI CVStach rate, normal  No mgr Chest: , coarse breath sounds, CT in place with bloody material, purulent material Abdomen: soft non-distended,  Extremities: 3+ edema in LE Skin: no rashes, + tatooos Neuro: nonfocal  CBC:    BMET Recent Labs    03/03/19 1218 03/05/19 0233  NA 136 136  K 3.4* 3.9  CL 107 105   CO2 23 24  GLUCOSE 137* 158*  BUN 25* 16  CREATININE 0.44* 0.43*  CALCIUM 7.4* 7.4*     Liver Panel  No results for input(s): PROT, ALBUMIN, AST, ALT, ALKPHOS, BILITOT, BILIDIR, IBILI in the last 72 hours.     Sedimentation Rate No results for input(s): ESRSEDRATE in the last 72 hours. C-Reactive Protein No results for input(s): CRP in the last 72 hours.  Micro Results: Recent Results (from the past 720 hour(s))  Blood Culture (routine x 2)     Status: Abnormal   Collection Time: 02/21/19  8:13 PM   Specimen: BLOOD LEFT FOREARM  Result Value Ref Range Status   Specimen Description   Final    BLOOD LEFT FOREARM Performed at [facility], [address], [place], [place] [address]    Special Requests   Final    BOTTLES DRAWN AEROBIC AND ANAEROBIC Blood Culture results may not be optimal due to an inadequate volume of blood received in culture bottles Performed at [facility] [address], [place], [place] [address]    Culture  Setup Time   Final    GRAM POSITIVE COCCI IN BOTH AEROBIC AND ANAEROBIC BOTTLES CRITICAL RESULT CALLED TO, READ BACK BY AND VERIFIED WITH: PHARMD J LEGGE [id number] AT 1337 BY CM CRITICAL RESULT CALLED TO, READ BACK BY AND VERIFIED WITH: PHARMD E JACKSON [id number] AT 1357 BY CM Performed at [facility], [place] [address], [place], [place] [address]    Culture STAPHYLOCOCCUS AUREUS (A)  Final   Report Status 02/24/2019 FINAL  Final   Organism ID, Bacteria STAPHYLOCOCCUS AUREUS  Final      Susceptibility   Staphylococcus aureus - MIC*    CIPROFLOXACIN <=0.5 SENSITIVE Sensitive     ERYTHROMYCIN >=8 RESISTANT Resistant     GENTAMICIN <=0.5 SENSITIVE Sensitive     OXACILLIN 0.5 SENSITIVE Sensitive     TETRACYCLINE <=1 SENSITIVE Sensitive     VANCOMYCIN 1 SENSITIVE Sensitive     TRIMETH/SULFA <=10 SENSITIVE Sensitive     CLINDAMYCIN <=0.25 SENSITIVE Sensitive     RIFAMPIN <=0.5 SENSITIVE Sensitive     Inducible  Clindamycin NEGATIVE Sensitive     * STAPHYLOCOCCUS AUREUS  SARS Coronavirus 2 by RT PCR (hospital order, performed in [place] hospital lab) Nasopharyngeal Nasopharyngeal Swab     Status: None   Collection Time: 02/21/19  8:16 PM   Specimen: Nasopharyngeal Swab  Result Value Ref Range Status   SARS Coronavirus 2 NEGATIVE NEGATIVE Final    Comment: (NOTE) If result is NEGATIVE SARS-CoV-2 target nucleic acids are NOT DETECTED. The SARS-CoV-2 RNA is generally detectable in upper and lower  respiratory specimens during the acute phase of infection.  The lowest  concentration of SARS-CoV-2 viral copies this assay can detect is 250  copies / mL. A negative result does not preclude SARS-CoV-2 infection  and should not be used as the sole basis for treatment or other  patient management decisions.  A negative result may occur with  improper specimen collection / handling, submission of specimen other  than nasopharyngeal swab, presence of viral mutation(s) within the  areas targeted by this assay, and inadequate number of viral copies  (<250 copies / mL). A negative result must be combined with clinical  observations, patient history, and epidemiological information. If result is POSITIVE SARS-CoV-2 target nucleic acids are DETECTED. The SARS-CoV-2 RNA is generally detectable in upper and lower  respiratory specimens dur ing the acute phase of infection.  Positive  results are indicative of active infection with SARS-CoV-2.  Clinical  correlation with patient history and other diagnostic information is  necessary to determine patient infection status.  Positive results do  not rule out bacterial infection or co-infection with other viruses. If result is PRESUMPTIVE POSTIVE SARS-CoV-2 nucleic acids MAY BE PRESENT.   A presumptive positive result was obtained on the submitted specimen  and confirmed on repeat testing.  While 2019 novel coronavirus  (SARS-CoV-2) nucleic acids may be present  in the submitted sample  additional confirmatory testing may be necessary for epidemiological  and / or clinical management purposes  to differentiate between  SARS-CoV-2 and other Sarbecovirus currently known to infect humans.  If clinically indicated additional testing with an alternate test  methodology [phone]) is advised. The SARS-CoV-2 RNA is generally  detectable in upper and lower respiratory sp ecimens during the acute  phase of infection. The expected result is Negative. Fact Sheet for Patients:  StrictlyIdeas.no Fact Sheet for Healthcare Providers: BankingDealers.co.za This test is not yet approved or cleared by the [place] FDA and has been authorized for detection and/or diagnosis of SARS-CoV-2 by FDA under an Emergency Use Authorization (EUA).  This EUA will remain in effect (meaning this test can be used) for the duration of the COVID-19 declaration under Section 564(b)(1) of the Act, 21 U.S.C. section 360bbb-3(b)(1), unless the authorization is terminated or revoked sooner. Performed at [facility], [place] [address] [place], [place] [address]   MRSA PCR Screening     Status: None   Collection Time: 02/22/19  4:56 AM   Specimen: Nasal Mucosa; Nasopharyngeal  Result Value Ref Range Status   MRSA by PCR NEGATIVE NEGATIVE Final    Comment:        The GeneXpert MRSA Assay (FDA approved for NASAL specimens only), is one component of a comprehensive MRSA colonization surveillance program. It is not intended to diagnose MRSA infection nor to guide or monitor treatment for MRSA infections. Performed at [facility], [place] [address], [place], [place] [address]   Blood Culture (routine x 2)     Status: Abnormal   Collection Time: 02/22/19  5:33 AM   Specimen: BLOOD  Result Value Ref Range Status   Specimen Description   Final    BLOOD LEFT ANTECUBITAL Performed at [facility] [address], [place], [place] [address]    Special Requests   Final    BOTTLES DRAWN AEROBIC AND ANAEROBIC Blood Culture adequate volume Performed at [facility] [address] [place], [place] [address]    Culture  Setup Time   Final    GRAM POSITIVE COCCI ANAEROBIC BOTTLE ONLY CRITICAL  RESULT CALLED TO, READ BACK BY AND VERIFIED WITH: PHARMD E JACKSON [id number] AT 1108 BY CM    Culture (A)  Final    STAPHYLOCOCCUS AUREUS SUSCEPTIBILITIES PERFORMED ON PREVIOUS CULTURE WITHIN THE LAST 5 DAYS. Performed at [facility], [place] [address] [place], [place] [address]    Report Status 02/24/2019 FINAL  Final  Aerobic/Anaerobic Culture (surgical/deep wound)     Status: None   Collection Time: 02/22/19 10:05 AM   Specimen: Abscess  Result Value Ref Range Status   Specimen Description   Final    ABSCESS RT PSOAS Performed at [facility] [address], [place], [place] [address]    Special Requests   Final    NONE Performed at [facility], [place] [address] [place], [place] [address]    Gram Stain   Final    RARE WBC PRESENT,BOTH PMN AND MONONUCLEAR FEW GRAM POSITIVE COCCI IN PAIRS    Culture   Final    MODERATE STAPHYLOCOCCUS AUREUS NO ANAEROBES ISOLATED Performed at [facility], [place] [address] [place], [place] [address]    Report Status 02/27/2019 FINAL  Final   Organism ID, Bacteria STAPHYLOCOCCUS AUREUS  Final      Susceptibility   Staphylococcus aureus - MIC*    CIPROFLOXACIN <=0.5 SENSITIVE Sensitive     ERYTHROMYCIN >=8 RESISTANT Resistant     GENTAMICIN <=0.5 SENSITIVE Sensitive     OXACILLIN 0.5 SENSITIVE Sensitive     TETRACYCLINE <=1 SENSITIVE Sensitive     VANCOMYCIN <=0.5 SENSITIVE Sensitive     TRIMETH/SULFA <=10 SENSITIVE Sensitive     CLINDAMYCIN <=0.25 SENSITIVE Sensitive     RIFAMPIN <=0.5 SENSITIVE Sensitive     Inducible Clindamycin NEGATIVE Sensitive     *  MODERATE STAPHYLOCOCCUS AUREUS  Culture, respiratory (non-expectorated)     Status: None   Collection Time: 02/22/19  5:30 PM   Specimen: Bronchoalveolar Lavage; Respiratory  Result Value Ref Range Status   Specimen Description   Final    BRONCHIAL ALVEOLAR LAVAGE Performed at [facility] [address] [place], [place] [address]    Special Requests   Final    NONE Performed at [facility], [place] [address] [place], [place] [address]    Gram Stain   Final    ABUNDANT WBC PRESENT, PREDOMINANTLY PMN MODERATE GRAM POSITIVE COCCI Performed at [facility], [place] [address], [place], [place] [address]    Culture MODERATE STAPHYLOCOCCUS AUREUS  Final   Report Status 02/25/2019 FINAL  Final   Organism ID, Bacteria STAPHYLOCOCCUS AUREUS  Final      Susceptibility   Staphylococcus aureus - MIC*    CIPROFLOXACIN <=0.5 SENSITIVE Sensitive     ERYTHROMYCIN >=8 RESISTANT Resistant     GENTAMICIN <=0.5 SENSITIVE Sensitive     OXACILLIN 0.5 SENSITIVE Sensitive     TETRACYCLINE <=1 SENSITIVE Sensitive     VANCOMYCIN 1 SENSITIVE Sensitive     TRIMETH/SULFA <=10 SENSITIVE Sensitive     CLINDAMYCIN <=0.25 SENSITIVE Sensitive     RIFAMPIN <=0.5 SENSITIVE Sensitive     Inducible Clindamycin NEGATIVE Sensitive     * MODERATE STAPHYLOCOCCUS AUREUS  Culture, blood (Routine X 2) w Reflex to ID Panel     Status: None   Collection Time: 02/24/19 11:47 AM   Specimen: BLOOD LEFT HAND  Result Value Ref Range Status   Specimen Description   Final    BLOOD LEFT HAND Performed at  [facility], [place] [address], [place], [place] [address]    Special Requests   Final    BOTTLES DRAWN AEROBIC AND ANAEROBIC Blood Culture adequate volume Performed at [facility] [address], [place], [place] [address]    Culture   Final    NO GROWTH 5 DAYS Performed at [facility], [place] [address], [place], [place] [address]     Report Status 03/01/2019 FINAL  Final  Body fluid culture     Status: None   Collection Time: 03/01/19  1:30 PM   Specimen: Chest; Body Fluid  Result Value Ref Range Status   Specimen Description   Final    CHEST Performed at [facility] [address], [place], [place] [address]    Special Requests   Final    Normal Performed at [facility], [place] [address], [place], [place] [address]    Gram Stain   Final    MODERATE WBC PRESENT, PREDOMINANTLY PMN FEW GRAM POSITIVE COCCI IN CLUSTERS Performed at [facility], [place] [address] [place], [place] [address]    Culture RARE STAPHYLOCOCCUS AUREUS  Final   Report Status 03/04/2019 FINAL  Final   Organism ID, Bacteria STAPHYLOCOCCUS AUREUS  Final      Susceptibility   Staphylococcus aureus - MIC*    CIPROFLOXACIN <=0.5 SENSITIVE Sensitive     ERYTHROMYCIN >=8 RESISTANT Resistant     GENTAMICIN <=0.5 SENSITIVE Sensitive     OXACILLIN 0.5 SENSITIVE Sensitive     TETRACYCLINE <=1 SENSITIVE Sensitive     VANCOMYCIN <=0.5 SENSITIVE Sensitive     TRIMETH/SULFA <=10 SENSITIVE Sensitive     CLINDAMYCIN <=0.25 SENSITIVE Sensitive     RIFAMPIN <=0.5 SENSITIVE Sensitive     Inducible Clindamycin NEGATIVE Sensitive     * RARE STAPHYLOCOCCUS AUREUS  Culture, blood (routine x 2)     Status: None (Preliminary result)   Collection Time: 03/02/19  7:10 PM   Specimen: BLOOD RIGHT ARM  Result Value Ref Range Status   Specimen Description   Final    BLOOD RIGHT ARM Performed at [facility], [address] [place], [place] [address]    Special Requests   Final    BOTTLES DRAWN AEROBIC AND ANAEROBIC Blood Culture adequate volume Performed at [facility] [address] [place], [place] [address]    Culture   Final    NO GROWTH 3 DAYS Performed at [facility], [place] [address] [place], [place] [address]    Report Status PENDING  Incomplete  Culture, blood (routine x  2)     Status: None (Preliminary result)   Collection Time: 03/02/19  7:17 PM   Specimen: BLOOD LEFT ARM  Result Value Ref Range Status   Specimen Description   Final    BLOOD LEFT ARM Performed at [facility], [place] [address] [place], [place] [address]    Special Requests   Final    BOTTLES DRAWN AEROBIC ONLY Blood Culture adequate volume Performed at [facility] [address] [place], [place] [address]    Culture   Final    NO GROWTH 3 DAYS Performed at [facility], [address], [place], [place] [address]    Report Status PENDING  Incomplete    Studies/Results: Dg Chest 1 View  Result Date: 03/03/2019 CLINICAL DATA:  48 year old male with increased shortness of breath EXAM: CHEST  1 VIEW COMPARISON:  Multiple prior comparison is most recently 03/03/2019, dating to 02/28/2019 FINDINGS: Cardiomediastinal silhouette unchanged in size and contour. Nodules throughout the lungs again noted, compatible with known septic emboli. Unchanged position of thoracostomy tube at the left lung base. The small pneumothorax at the lateral left lung appears larger than the comparison plain film. Similar appearance of coarsened interstitial markings throughout. IMPRESSION: Unchanged thoracostomy tube. The small left pneumothorax appears slightly larger than the comparison plain film though overall has been improving over the comparison chest x-rays dating to 02/28/2019. Multiple pulmonary nodules compatible with known septic emboli. Electronically Signed   By: Corrie Mckusick D.O.   On: 03/03/2019 14:42   Dg Chest Port 1 View  Result Date: 03/04/2019 CLINICAL DATA:  Pneumothorax EXAM: PORTABLE CHEST 1 VIEW COMPARISON:  Chest radiograph 03/03/2019. FINDINGS: Monitoring leads overlie the patient. Stable cardiac and mediastinal contours. Scattered nodules demonstrated throughout the lungs bilaterally. Similar heterogeneous opacities left lung base. Left chest tube remains in  position. Similar small pneumothorax within the upper lateral left hemithorax. Thoracic spine degenerative changes. IMPRESSION: 1. Similar small pneumothorax within the upper lateral left hemithorax with chest tube in place. 2. Scattered nodules throughout the lungs bilaterally. 3. Similar heterogeneous opacities left lung base. Electronically Signed   By: Lovey Newcomer M.D.   On: 03/04/2019 09:39      Assessment/Plan:  INTERVAL HISTORY: blood cultures remain NGTD post central line removal  Principal Problem:   Sepsis ([place]) Active Problems:   Dyspnea   Heroin use   Endocarditis   Septic pulmonary embolism (HCC)   Osteomyelitis of thoracic spine (HCC)   Osteomyelitis of lumbar spine (HCC)   Acute encephalopathy   AKI (acute kidney injury) ([place])   Transaminitis   Psoas muscle abscess (HCC)   Pressure injury of skin   Mucus clot in bronchi   Protein-calorie malnutrition, severe   Abnormal echocardiogram   MSSA bacteremia   Acute pulmonary embolism (HCC)   Loculated pleural effusion   Empyema, left ([place])    Franklin Anderson is a 48 y.o. 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  There were no evidence of vegetations on the mitral valve but clearly he had them since he has septic embolization to the lungs with cavitary pathology and now loculated pneumothorax concerning for empyema he also has a pulmonary embolism. He is sp Chest TUBE placement He does also have a small pericardial effusion.  #1 Metastatic MSSAB as described:       [place] [facility] Management [facility] Staphylococcus aureus bacteremia   Staphylococcus aureus bacteremia (SAB) is associated with a high rate of complications and mortality.  Specific aspects of clinical management are critical to optimizing the outcome of patients with SAB.  Therefore, the [facility] Antimicrobial Management Team  [facility]) has initiated an intervention aimed at improving the management of SAB at [facility].  To do so, Infectious Diseases physicians are providing an evidence-based consult for the management of all patients with SAB.     Yes No Comments  Perform follow-up blood cultures (even if the patient is afebrile) to ensure clearance of bacteremia _0  _1  Repeated post central line removal  Remove vascular catheter and obtain follow-up blood cultures after the removal of the catheter _2  _3  CENTRA LINE out AVOID new central lines until we can prove sterilization of blood  Perform echocardiography to evaluate for endocarditis (transthoracic ECHO  is 40-50% sensitive, TEE is > 90% sensitive) _0  _1  TEE negative but undoubtedly has simply thrown vegetqation into lungs and he has pericardial effusion  Consult electrophysiologist to evaluate implanted cardiac device (pacemaker, ICD) _2  _3    Ensure source control _4  _5  Need to monitor Neuro exam, CT tube in place but might need decortication, agree with repeat CT next week  Investigate for "metastatic" sites of infection _6  _7  Does the patient have ANY symptom or physical exam finding that would suggest a deeper infection (back or neck pain that may be suggestive of vertebral osteomyelitis or epidural abscess, muscle pain that could be a symptom of pyomyositis)?  Keep in mind that for deep seeded infections MRI imaging with contrast is preferred rather than other often insensitive tests such as plain x-rays, especially early in a patient's presentation.  Change antibiotic therapy to cefazolin _8  _9  Beta-lactam antibiotics are preferred for MSSA due to higher cure rates.   If on Vancomycin, goal trough should be 15 - 20 mcg/mL  Estimated duration of IV antibiotic therapy:  6-8 weeks but would need to be int he hospital _10  _11  Consult case management for probably prolonged outpatient IV antibiotic therapy   #2 #2 Lumbar diskitis/vertebral osteomyelitis: monitor Neuro  exam and continue antibiotics  #3  Loculated effusion with pneumothorax  MSSA looks to be growing .Agree with repeat CT. 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    LOS: 11 days   Alcide Evener 03/05/2019, 9:42 AM

## 2019-03-05 NOTE — Progress Notes (Signed)
[facility]                                                                              Patient Demographics  Franklin Anderson, is a 48 y.o. male, DOB - 1971/04/19, ZOX:[id number]  Admit date - 02/21/2019   Admitting Physician Hillary Bow, DO  Outpatient Primary MD for the patient is Julieanne Manson, MD  Outpatient specialists:   LOS - 11  days   Medical records reviewed and are as summarized below:    Chief Complaint  Patient presents with   Back Pain   Heroin Detox   Weight Loss       Brief summary   47 y.o.malewith medical history significant of rheumatic fever, HTN, ongoingIV heroin abuse, presented to the ED on 10/27 with 3 week course of progressively worsening back pain and BLE weakness associated with some bowel incontinence and trouble walking. Last use of heroin was on the day of admission. ED Course:Found to have:sepsis with Tm 100, HR 126, RR 31, WBC 22.4k,AKI with BUN 111 and creat 1.7. Imaging studies revealed osteomyelitis and diskitis of L4-L5,septic pulmonary emboli and B psoas abscesses. Patient recieved 2L NS bolus, put on cefepime / vanc / flagyl initially, admitted to [facility] for Mx of sepsis and associated metabolic encephalopathy.  Imaging studies revealed osteomyelitis and discitis of L4-L5, septic pulmonary emboli, psoas abscess.  Blood cultures 10/27> MSSA.  On 10/28 was found to be obtunded and dyspneic and was intubated by critical care, extubated on 10/31.  Right psoas abscess was aspirated on 10/28, wound care > MSSA.  Respiratory culture from 10/28 also grew MSSA. 2D echo revealed grade 1 diastolic dysfunction, possible mitral valve vegetation.  MRI head also showed abnormal dural enhancement.  Additional comorbidities included hyper natremia, persistent uremia, pericardial effusion, severe deconditioning. 11/3: Patient underwent TEE and had acute respiratory distress later that evening with stat chest x-ray showing loculated  pneumothorax/CT chest indicating empyema/PE/hydropneumothorax.  Now seen by PCCM and post chest tube placement.Code status changed to DNR  Assessment & Plan    Principal Problem: Acute hypoxic respiratory failure, acute respiratory distress on 11/3 with CT findings of empyema -Extubated since 10/31.  Respiratory cultures grew MSSA. -2D echo showed possible mitral valve vegetation, underwent TEE on 11/3, noted to be in respiratory distress. -Chest x-ray showed loculated pneumothorax with mediastinal shift.  CCM was reconsulted.  CT chest showed widespread septic emboli with more central pulmonary artery filling defects seen within the left lower lobe, positive for acute PE with CT evidence of right heart strain.  No pneumo mediastinum.  Left large hydropneumothorax, concerning for empyema. -Patient underwent chest tube/drainage of empyema by CCM on 11/4, cultures >MSSA. -ID following, recommended continue current antibiotics.  -  Cardiology recommended discontinuing heparin drip that was started for PE as likely representative of septic emboli -Continue chest tube, pulmonology following, discussed with Dr. Marchelle Gearing today, recommended transfer to the floor  Active Problems:  MSSA bacteremia/sepsis with discitis/vertebral osteomyelitis/psoas abscess/septic pulmonary emboli/cavitary lesion, empyema, mural vegetation -In the setting of IV drug abuse.  Blood cultures, so was cultures and respiratory cultures growing MSSA. -will plan on PICC  line once cleared by ID.  Poor peripheral IV access due to drug abuse history. -ID following, recommended repeat CT next week, currently chest tube in place, may need decortication -Moving all 4 extremities, no focal weakness -Repeat 2D echo in a week, limited echo on 11/4 had shown circumferential  moderate pericardial effusion, no evidence of tamponade  Psoas abscess -Status post CT-guided right psoas aspiration by IR on 10/28 yielding 18 mL of purulent  fluid, cultures grew MSSA -States pain is not well controlled, added long-acting OxyContin 10 mg oral every 12 hours, continue as needed pain medications  Pericardial effusion -Repeat 2D echo in a week, limited echo on 11/4 had shown circumferential  moderate pericardial effusion, no evidence of tamponade  Hypernatremia Sodium level now improved, tolerating diet.  Sodium 148 on 10/31 Sodium 136  AKI, uremia -On admission, creatinine 1.7 with lactic acidosis 2.1 -Mental status at baseline, creatinine 0.4  Acute metabolic encephalopathy -In the setting of sepsis, widespread septic emboli,  Uremia Mental status improved.  Normocytic anemia -Possibly anemia of chronic disease versus hemolytic in the setting of endocarditis.   -H&H currently stable  Uncontrolled hypertension -Continue metoprolol, Norvasc, hydralazine IV as needed with parameters   Pressure injury Sacrum with stage I, wound care per nursing  Severe protein calorie malnutrition, hypoalbuminemia, generalized deconditioning -Albumin level 1.4 on 10/31, will recheck  Code Status: DNR status DVT Prophylaxis: Heparin subcu Family Communication: Discussed all imaging results, lab results, explained to the patient   Disposition Plan: Discussed with pulmonology, Dr. Marchelle Gearingamaswamy, recommended to transfer to telemetry floor.  Follow respiratory status closely.  Time Spent in minutes 25 minutes  Procedures:    Consultants:   Cardiology CCM Infectious disease Palliative care  Antimicrobials:   Anti-infectives (From admission, onward)   Start     Dose/Rate Route Frequency Ordered Stop   02/24/19 2200  vancomycin (VANCOCIN) IVPB 1000 mg/200 mL premix  Status:  Discontinued     1,000 mg 200 mL/hr over 60 Minutes Intravenous Every 12 hours 02/24/19 1010 02/24/19 1253   02/24/19 1600  ceFAZolin (ANCEF) IVPB 2g/100 mL premix     2 g 200 mL/hr over 30 Minutes Intravenous Every 8 hours 02/24/19 1253     02/23/19 2200   vancomycin (VANCOCIN) IVPB 750 mg/150 ml premix  Status:  Discontinued     750 mg 150 mL/hr over 60 Minutes Intravenous Every 24 hours 02/23/19 0853 02/23/19 0909   02/23/19 2200  vancomycin (VANCOCIN) IVPB 750 mg/150 ml premix  Status:  Discontinued     750 mg 150 mL/hr over 60 Minutes Intravenous Every 12 hours 02/23/19 0909 02/24/19 1010   02/22/19 2200  vancomycin (VANCOCIN) IVPB 750 mg/150 ml premix  Status:  Discontinued     750 mg 150 mL/hr over 60 Minutes Intravenous Every 24 hours 02/22/19 0826 02/22/19 0827   02/22/19 2200  vancomycin (VANCOCIN) IVPB 1000 mg/200 mL premix  Status:  Discontinued     1,000 mg 200 mL/hr over 60 Minutes Intravenous Every 24 hours 02/22/19 0827 02/23/19 0853   02/22/19 0900  ceFEPIme (MAXIPIME) 2 g in sodium chloride 0.9 % 100 mL IVPB  Status:  Discontinued     2 g 200 mL/hr over 30 Minutes Intravenous Every 12 hours 02/22/19 0826 02/23/19 1135   02/21/19 2015  ceFEPIme (MAXIPIME) 2 g in sodium chloride 0.9 % 100 mL IVPB     2 g 200 mL/hr over 30 Minutes Intravenous  Once 02/21/19 2013 02/22/19 0031   02/21/19 2015  metroNIDAZOLE (FLAGYL) IVPB 500 mg     500 mg 100 mL/hr over 60 Minutes Intravenous  Once 02/21/19 2013 02/22/19 0031   02/21/19 2015  vancomycin (VANCOCIN) IVPB 1000 mg/200 mL premix     1,000 mg 200 mL/hr over 60 Minutes Intravenous  Once 02/21/19 2013 02/22/19 0200         Medications  Scheduled Meds:  amLODipine  10 mg Oral Daily   Chlorhexidine Gluconate Cloth  6 each Topical Daily   feeding supplement (ENSURE ENLIVE)  237 mL Oral BID BM   feeding supplement (PRO-STAT SUGAR FREE 64)  30 mL Oral BID   insulin aspart  0-24 Units Subcutaneous TID AC & HS   mouth rinse  15 mL Mouth Rinse BID   metoprolol tartrate  50 mg Oral BID   multivitamin with minerals  1 tablet Oral Daily   oxyCODONE  10 mg Oral Q12H   pantoprazole  40 mg Oral Daily   sodium chloride flush  10-40 mL Intracatheter Q12H   Continuous  Infusions:   ceFAZolin (ANCEF) IV Stopped (03/05/19 4098)   sodium chloride     PRN Meds:.acetaminophen **OR** acetaminophen, acetaminophen, clonazePAM, hydrALAZINE, HYDROmorphone (DILAUDID) injection, ipratropium-albuterol, ondansetron **OR** ondansetron (ZOFRAN) IV, oxyCODONE-acetaminophen, senna-docusate, sodium chloride flush      Subjective:   Franklin Anderson was seen and examined today.  Pain not well controlled, in the back and the area of the chest tube, 8/10.  Respiratory status stable.  No fevers or chills.  Patient denies dizziness, shortness of breath, abdominal pain, N/V/D/C, new weakness, numbess, tingling. No acute events overnight.    Objective:   Vitals:   03/05/19 0600 03/05/19 0800 03/05/19 0825 03/05/19 0949  BP: (!) 135/96  (!) 153/107 (!) 147/96  Pulse: 100  (!) 109 (!) 108  Resp: (!) 22  17 20   Temp:  98.1 F (36.7 C)  98.6 F (37 C)  TempSrc:  Oral  Oral  SpO2: 96%  98% 97%  Weight:      Height:        Intake/Output Summary (Last 24 hours) at 03/05/2019 1248 Last data filed at 03/05/2019 1139 Gross per 24 hour  Intake 711.24 ml  Output 1370 ml  Net -658.76 ml     Wt Readings from Last 3 Encounters:  03/05/19 54 kg  12/21/17 65.3 kg  04/22/16 68 kg    Physical Exam  General: Alert and oriented x 3, NAD  Eyes: PERRLA, EOMI, Anicteric Sclera,  HEENT:  Atraumatic, normocephalic  Cardiovascular: S1 S2 clear, RRR.   Respiratory: No wheezing, chest tube to the left  Gastrointestinal: Soft, nontender, nondistended, NBS  Ext: no pedal edema bilaterally  Neuro: no new deficits  Musculoskeletal: No cyanosis, clubbing  Skin: No rashes  Psych: Normal affect and demeanor, alert and oriented x3    Data Reviewed:  I have personally reviewed following labs and imaging studies  Micro Results Recent Results (from the past 240 hour(s))  Culture, blood (Routine X 2) w Reflex to ID Panel     Status: None   Collection Time: 02/24/19 11:47 AM    Specimen: BLOOD LEFT HAND  Result Value Ref Range Status   Specimen Description   Final    BLOOD LEFT HAND Performed at [facility], [address], [place], [place]    Special Requests   Final    BOTTLES DRAWN AEROBIC AND ANAEROBIC Blood Culture adequate volume Performed at [facility], [address]  [place], [place] [address]    Culture   Final    NO GROWTH 5 DAYS Performed at [facility], [address] [place], [place] [address]    Report Status 03/01/2019 FINAL  Final  Body fluid culture     Status: None   Collection Time: 03/01/19  1:30 PM   Specimen: Chest; Body Fluid  Result Value Ref Range Status   Specimen Description   Final    CHEST Performed at [facility], [address], [place], [place] [address]    Special Requests   Final    Normal Performed at [facility], [address], [place], [place] [address]    Gram Stain   Final    MODERATE WBC PRESENT, PREDOMINANTLY PMN FEW GRAM POSITIVE COCCI IN CLUSTERS Performed at [facility], [address] [place], [place] [address]    Culture RARE STAPHYLOCOCCUS AUREUS  Final   Report Status 03/04/2019 FINAL  Final   Organism ID, Bacteria STAPHYLOCOCCUS AUREUS  Final      Susceptibility   Staphylococcus aureus - MIC*    CIPROFLOXACIN <=0.5 SENSITIVE Sensitive     ERYTHROMYCIN >=8 RESISTANT Resistant     GENTAMICIN <=0.5 SENSITIVE Sensitive     OXACILLIN 0.5 SENSITIVE Sensitive     TETRACYCLINE <=1 SENSITIVE Sensitive     VANCOMYCIN <=0.5 SENSITIVE Sensitive     TRIMETH/SULFA <=10 SENSITIVE Sensitive     CLINDAMYCIN <=0.25 SENSITIVE Sensitive     RIFAMPIN <=0.5 SENSITIVE Sensitive     Inducible Clindamycin NEGATIVE Sensitive     * RARE STAPHYLOCOCCUS AUREUS  Culture, blood (routine x 2)     Status: None (Preliminary result)   Collection Time: 03/02/19  7:10 PM   Specimen: BLOOD RIGHT ARM  Result Value Ref  Range Status   Specimen Description   Final    BLOOD RIGHT ARM Performed at [facility], [address] [place], [place] [address]    Special Requests   Final    BOTTLES DRAWN AEROBIC AND ANAEROBIC Blood Culture adequate volume Performed at [facility], [address], [place], [place] [address]    Culture   Final    NO GROWTH 3 DAYS Performed at [facility], [address], [place], [place] [address]    Report Status PENDING  Incomplete  Culture, blood (routine x 2)     Status: None (Preliminary result)   Collection Time: 03/02/19  7:17 PM   Specimen: BLOOD LEFT ARM  Result Value Ref Range Status   Specimen Description   Final    BLOOD LEFT ARM Performed at [facility], [address], [place], [place] [address]    Special Requests   Final    BOTTLES DRAWN AEROBIC ONLY Blood Culture adequate volume Performed at [facility], [address] [place], [place] [address]    Culture   Final    NO GROWTH 3 DAYS Performed at [facility], [address], [place], [place] [address]    Report Status PENDING  Incomplete    Radiology Reports Dg Skull 1-3 Views  Result Date: 02/21/2019 CLINICAL DATA:  48 year old male with MRI clearance. EXAM: SKULL - 1-3 VIEW COMPARISON:  None. FINDINGS: There is no evidence of skull fracture or other focal bone lesions. There is opacification of the right frontal sinus. Nasal and tongue piercings noted. No other radiopaque foreign object identified. IMPRESSION: 1. Nose ring and  tongue piercing. No other radiopaque/metallic foreign object. 2. No evidence of skull fracture or other focal bone lesions. 3. Opacification of the right frontal sinus. . Electronically Signed   By: Elgie Collard M.D.   On: 02/21/2019 22:21   Dg Chest 1 View  Result Date: 03/03/2019 CLINICAL DATA:  48 year old male with increased shortness of breath EXAM: CHEST  1 VIEW COMPARISON:  Multiple prior comparison  is most recently 03/03/2019, dating to 02/28/2019 FINDINGS: Cardiomediastinal silhouette unchanged in size and contour. Nodules throughout the lungs again noted, compatible with known septic emboli. Unchanged position of thoracostomy tube at the left lung base. The small pneumothorax at the lateral left lung appears larger than the comparison plain film. Similar appearance of coarsened interstitial markings throughout. IMPRESSION: Unchanged thoracostomy tube. The small left pneumothorax appears slightly larger than the comparison plain film though overall has been improving over the comparison chest x-rays dating to 02/28/2019. Multiple pulmonary nodules compatible with known septic emboli. Electronically Signed   By: Gilmer Mor D.O.   On: 03/03/2019 14:42   Dg Chest 1 View  Result Date: 02/22/2019 CLINICAL DATA:  Status post central line placement EXAM: CHEST  1 VIEW COMPARISON:  02/22/2019,, 02/21/2019, 10/01/2014 FINDINGS: Interval intubation, tip of the endotracheal tube is about 3.8 cm superior to the carina. Esophageal tube tip is below the diaphragm but non included. Left IJ central venous catheter tip over the distal SVC. No left pneumothorax. Bilateral cavitary lesions. Progressive consolidation at the left lung base. Stable cardiomediastinal silhouette. IMPRESSION: 1. Support lines and tubes as above. Left-sided central venous catheter tip over the distal SVC. No left pneumothorax 2. Worsening consolidation at the left lung base. 3. Multiple bilateral cavitary lesions which may reflect septic emboli Electronically Signed   By: Jasmine Pang M.D.   On: 02/22/2019 19:31   Dg Abd 1 View  Result Date: 02/21/2019 CLINICAL DATA:  Back pain, progressive neuro deficit, MR clearance EXAM: ABDOMEN - 1 VIEW COMPARISON:  None. FINDINGS: Cardiac monitoring leads overlie the pelvis. No surgical hardware or metallic foreign body is identified. There is a paucity of upper abdominal bowel gas, nonspecific.  Patchy opacities are present in the left lung base with likely trace left effusion. Degenerative changes in the spine and hips. IMPRESSION: 1. No surgical hardware or metallic foreign body is identified. 2. Patchy opacities in the left lung base, suspicious for pneumonia. Trace left effusion. 3. Nonspecific paucity of upper abdominal bowel gas. Electronically Signed   By: Kreg Shropshire M.D.   On: 02/21/2019 22:18   Ct Head Wo Contrast  Result Date: 02/22/2019 CLINICAL DATA:  Encephalopathy EXAM: CT HEAD WITHOUT CONTRAST TECHNIQUE: Contiguous axial images were obtained from the base of the skull through the vertex without intravenous contrast. COMPARISON:  None. FINDINGS: Brain: There is no mass, hemorrhage or extra-axial collection. The size and configuration of the ventricles and extra-axial CSF spaces are normal. The brain parenchyma is normal, without acute or chronic infarction. Vascular: No abnormal hyperdensity of the major intracranial arteries or dural venous sinuses. No intracranial atherosclerosis. Skull: The visualized skull base, calvarium and extracranial soft tissues are normal. Sinuses/Orbits: No fluid levels or advanced mucosal thickening of the visualized paranasal sinuses. No mastoid or middle ear effusion. The orbits are normal. IMPRESSION: Normal head CT. Electronically Signed   By: Deatra Robinson M.D.   On: 02/22/2019 02:36   Ct Angio Chest Pe W Or Wo Contrast  Addendum Date: 02/28/2019   ADDENDUM REPORT: 02/28/2019 22:19 ADDENDUM: Minimal mediastinal  shift. Results and addendum were called by telephone at the time of interpretation on 02/28/2019 at 10:19 pm to provider NP Craige Cotta , who verbally acknowledged these results. Electronically Signed   By: Kreg Shropshire M.D.   On: 02/28/2019 22:19   Result Date: 02/28/2019 CLINICAL DATA:  Increasing shortness of breath post transesophageal ECHO EXAM: CT ANGIOGRAPHY CHEST WITH CONTRAST TECHNIQUE: Multidetector CT imaging of the chest was performed  using the standard protocol during bolus administration of intravenous contrast. Multiplanar CT image reconstructions and MIPs were obtained to evaluate the vascular anatomy. CONTRAST:  OMNIPAQUE IOHEXOL 350 MG/ML SOLN COMPARISON:  Same-day radiograph FINDINGS: Cardiovascular: Satisfactory opacification the pulmonary arteries to the segmental level. Segmental and subsegmental pulmonary artery emboli are noted throughout the left lower lobe, lingula and with the lobar, segmental and subsegmental arteries of the right upper lobe. Suspect more extensive embolic burden however given the numerous cavitary lesions throughout the lungs compatible with septic emboli. Flattening of the intraventricular septum and mild elevation of the RV LV ratio (0.95) concerning for right heart strain. Cardiac size remains within normal limits. Few coronary artery calcifications are present. No sizable pericardial effusion though difficult to discern given increased attenuation of the mediastinal fat. Mediastinum/Nodes: Diffusely increased attenuation of the mediastinal fat suggesting soft tissue edema. Prominent mediastinal and hilar adenopathy, likely reactive. No acute abnormality of the trachea or esophagus. Thyroid gland and thoracic inlet are unremarkable. Lungs/Pleura: There are numerous rounded and cavitary fluid-filled lesions throughout the lungs suspicious for septic pulmonary emboli. Some interspersed regions of ground-glass attenuation and tree-in-bud nodularity noted in the left lung base. There is irregular visceral pleural thickening involving the left lung with a large left hydropneumothorax with dependently layering simple attenuation fluid. Small right pleural effusion present as well with adjacent passive atelectasis. Upper Abdomen: Nodular hepatic surface contour. Moderate volume upper abdominal ascites. Musculoskeletal: Nonunited subacute to remote lateral left tenth rib fracture.No acute osseous abnormality or  suspicious osseous lesion. Paucity of subcutaneous fat. Body wall edema. Review of the MIP images confirms the above findings. IMPRESSION: 1. Features of widespread septic emboli with more central pulmonary arterial filling defects seen within the left lower lobe, positive for acute PE with CT evidence of right heart strain (RV/LV Ratio = 0.95) consistent with at least submassive (intermediate risk) PE. The presence of right heart strain has been associated with an increased risk of morbidity and mortality. Please activate Code PE by paging [phone]. 2. No visible pneumomediastinum. 3. Irregular visceral pleural thickening involving the left lung with a large left hydropneumothorax, concerning for empyema. 4. Small right pleural effusion and adjacent passive atelectasis. 5. Nodular hepatic surface contour, consistent with cirrhosis. Moderate volume upper abdominal ascites. 6. Additional features of anasarca diffusely increased attenuation of the mediastinal fat and circumferential body wall edema. 7. Aortic Atherosclerosis (ICD10-I70.0). Currently awaiting provider callback. Addendum will be submitted following contact with the ordering provider or care team. Electronically Signed: By: Kreg Shropshire M.D. On: 02/28/2019 22:11   Mr Angio Head Wo Contrast  Result Date: 02/22/2019 CLINICAL DATA:  Encephalopathy, endocarditis and osteomyelitis EXAM: MRI HEAD WITHOUT CONTRAST MRA HEAD WITHOUT CONTRAST TECHNIQUE: Multiplanar, multiecho pulse sequences of the brain and surrounding structures were obtained without intravenous contrast. Angiographic images of the head were obtained using MRA technique without contrast. COMPARISON:  None. FINDINGS: Motion artifact is present. MRI HEAD FINDINGS Brain: There is no acute infarction or intracranial hemorrhage. There is no intracranial mass, mass effect, edema, or hydrocephalus. Thin pachymeningeal thickening is  present. Vascular: Major vessel flow voids at the skull base  are preserved. Skull and upper cervical spine: Marrow signal is within normal limits. Sinuses/Orbits: Minor paranasal sinus mucosal thickening. The orbits are unremarkable. Other: None. MRA HEAD FINDINGS Motion artifact present. There is preserved flow related enhancement of the intracranial internal carotid arteries and proximal middle and anterior cerebral arteries. Visualized intracranial vertebral arteries basilar artery, and proximal posterior cerebral arteries are also patent. There is fetal or near fetal origin of the left posterior cerebral artery. No gross aneurysm identified. IMPRESSION: Motion degraded study without evidence of septic emboli. Nonspecific thin dural thickening, which could be secondary to lumbar puncture if recently performed. Unremarkable proximal intracranial circulation. Electronically Signed   By: Macy Mis M.D.   On: 02/22/2019 12:17   Mr Brain Wo Contrast  Result Date: 02/22/2019 CLINICAL DATA:  Encephalopathy, endocarditis and osteomyelitis EXAM: MRI HEAD WITHOUT CONTRAST MRA HEAD WITHOUT CONTRAST TECHNIQUE: Multiplanar, multiecho pulse sequences of the brain and surrounding structures were obtained without intravenous contrast. Angiographic images of the head were obtained using MRA technique without contrast. COMPARISON:  None. FINDINGS: Motion artifact is present. MRI HEAD FINDINGS Brain: There is no acute infarction or intracranial hemorrhage. There is no intracranial mass, mass effect, edema, or hydrocephalus. Thin pachymeningeal thickening is present. Vascular: Major vessel flow voids at the skull base are preserved. Skull and upper cervical spine: Marrow signal is within normal limits. Sinuses/Orbits: Minor paranasal sinus mucosal thickening. The orbits are unremarkable. Other: None. MRA HEAD FINDINGS Motion artifact present. There is preserved flow related enhancement of the intracranial internal carotid arteries and proximal middle and anterior cerebral arteries.  Visualized intracranial vertebral arteries basilar artery, and proximal posterior cerebral arteries are also patent. There is fetal or near fetal origin of the left posterior cerebral artery. No gross aneurysm identified. IMPRESSION: Motion degraded study without evidence of septic emboli. Nonspecific thin dural thickening, which could be secondary to lumbar puncture if recently performed. Unremarkable proximal intracranial circulation. Electronically Signed   By: Macy Mis M.D.   On: 02/22/2019 12:17   Mr Thoracic Spine W Wo Contrast  Result Date: 02/21/2019 CLINICAL DATA:  Back pain with weakness. History of IV drug use. EXAM: MRI THORACIC AND LUMBAR SPINE WITHOUT AND WITH CONTRAST TECHNIQUE: Multiplanar and multiecho pulse sequences of the thoracic and lumbar spine were obtained without and with intravenous contrast. CONTRAST:  38mL GADAVIST GADOBUTROL 1 MMOL/ML IV SOLN COMPARISON:  None. FINDINGS: MRI THORACIC SPINE FINDINGS Alignment:  Physiologic. Vertebrae: Hyperintense T2-weighted signal and contrast enhancement within the T1 vertebrae. Cord:  Normal Paraspinal and other soft tissues: There are multiple cavitary lesions within the left lung. There is a large cavitary lesion of the right lung apex. Mild contrast enhancement of the paraspinal tissues at T1. Disc levels: Small central disc protrusion at T8-9 with no spinal canal stenosis. MRI LUMBAR SPINE FINDINGS Segmentation:  Standard. Alignment:  Normal Vertebrae: There is hyperintense T2-weighted signal throughout the bone marrow of the L4 and L5 vertebral bodies, extending to the disc space. There is abnormal contrast enhancement of both vertebra. Bone marrow signal is otherwise normal throughout the lumbar spine. There is fluid within both L4-5 facet joints. Conus medullaris: Extends to the L2 level and appears normal. Paraspinal and other soft tissues: There are bilateral psoas muscle collections, measuring up to 2.6 cm on the right and 2.5 cm  on the left. Disc levels: L1-2: Normal. L2-3: Intermediate disc bulge with mild facet hypertrophy. No stenosis. L3-4: Left eccentric disc  bulge with mild left lateral recess narrowing. Mild left foraminal stenosis. No spinal canal stenosis. L4-5: Abnormal enhancement as above. The abnormal enhancement extends into both neural foramina. There is severe bilateral foraminal stenosis. No central spinal canal stenosis. There is mild contrast enhancement in the soft tissues adjacent to the facet joints. L5-S1: There is edema of the paraspinal soft tissues. No disc herniation or stenosis. IMPRESSION: 1. Abnormal enhancement of the L4 and L5 vertebral bodies and disc space, consistent with discitis osteomyelitis. There is severe bilateral foraminal stenosis at the L4-5 level with abnormal contrast enhancement extending into both neural foramina. 2. Bilateral psoas muscle abscesses, measuring up to 2.6 cm on the right and 2.5 cm on the left. Paraspinal soft tissue inflammation at L4-5 with fluid in both L4-5 facet joints, which may be reactive or indicative of septic arthritis. 3. Abnormal signal and enhancement of the T1 vertebral body with surrounding paraspinal soft tissue enhancement, possibly indicating osteomyelitis. 4. Multiple cavitary lesions within both lungs, consistent with septic emboli. Electronically Signed   By: Deatra Robinson M.D.   On: 02/21/2019 23:43   Mr Lumbar Spine W Wo Contrast (assess For Abscess, Cord Compression)  Result Date: 02/21/2019 CLINICAL DATA:  Back pain with weakness. History of IV drug use. EXAM: MRI THORACIC AND LUMBAR SPINE WITHOUT AND WITH CONTRAST TECHNIQUE: Multiplanar and multiecho pulse sequences of the thoracic and lumbar spine were obtained without and with intravenous contrast. CONTRAST:  6mL GADAVIST GADOBUTROL 1 MMOL/ML IV SOLN COMPARISON:  None. FINDINGS: MRI THORACIC SPINE FINDINGS Alignment:  Physiologic. Vertebrae: Hyperintense T2-weighted signal and contrast  enhancement within the T1 vertebrae. Cord:  Normal Paraspinal and other soft tissues: There are multiple cavitary lesions within the left lung. There is a large cavitary lesion of the right lung apex. Mild contrast enhancement of the paraspinal tissues at T1. Disc levels: Small central disc protrusion at T8-9 with no spinal canal stenosis. MRI LUMBAR SPINE FINDINGS Segmentation:  Standard. Alignment:  Normal Vertebrae: There is hyperintense T2-weighted signal throughout the bone marrow of the L4 and L5 vertebral bodies, extending to the disc space. There is abnormal contrast enhancement of both vertebra. Bone marrow signal is otherwise normal throughout the lumbar spine. There is fluid within both L4-5 facet joints. Conus medullaris: Extends to the L2 level and appears normal. Paraspinal and other soft tissues: There are bilateral psoas muscle collections, measuring up to 2.6 cm on the right and 2.5 cm on the left. Disc levels: L1-2: Normal. L2-3: Intermediate disc bulge with mild facet hypertrophy. No stenosis. L3-4: Left eccentric disc bulge with mild left lateral recess narrowing. Mild left foraminal stenosis. No spinal canal stenosis. L4-5: Abnormal enhancement as above. The abnormal enhancement extends into both neural foramina. There is severe bilateral foraminal stenosis. No central spinal canal stenosis. There is mild contrast enhancement in the soft tissues adjacent to the facet joints. L5-S1: There is edema of the paraspinal soft tissues. No disc herniation or stenosis. IMPRESSION: 1. Abnormal enhancement of the L4 and L5 vertebral bodies and disc space, consistent with discitis osteomyelitis. There is severe bilateral foraminal stenosis at the L4-5 level with abnormal contrast enhancement extending into both neural foramina. 2. Bilateral psoas muscle abscesses, measuring up to 2.6 cm on the right and 2.5 cm on the left. Paraspinal soft tissue inflammation at L4-5 with fluid in both L4-5 facet joints,  which may be reactive or indicative of septic arthritis. 3. Abnormal signal and enhancement of the T1 vertebral body with surrounding paraspinal soft  tissue enhancement, possibly indicating osteomyelitis. 4. Multiple cavitary lesions within both lungs, consistent with septic emboli. Electronically Signed   By: Deatra Robinson M.D.   On: 02/21/2019 23:43   US Renal  Result Date: 02/22/2019 CLINICAL DATA:  Acute renal injury EXAM: RENAL / URINARY TRACT ULTRASOUND COMPLETE COMPARISON:  None. FINDINGS: Right Kidney: Renal measurements: 11.2 x 6.2 x 5.9 cm. = volume: 213 mL. Mild hydronephrosis is noted. Mild increased echogenicity is seen. Left Kidney: Renal measurements: 13 x 5.8 x 5.7 cm = volume: 223 mL. Increased echogenicity is noted. Mild hydronephrosis is noted. Bladder: Bladder is well distended. Debris is noted within. Correlate with any possible UTI. Other: None IMPRESSION: Mild hydronephrosis is noted. This may be related to the distended bladder. Mild debris within the bladder which may be related to an underlying UTI. Clinical correlation is recommended. Increased echogenicity is noted consistent with medical renal disease. Electronically Signed   By: Alcide Clever M.D.   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 The abscesses are currently too small for drain placement, however we can proceed with CT-guided aspiration to provide material for culture. EXAM: CT-guided aspiration MEDICATIONS: The patient is currently admitted to the hospital and receiving intravenous antibiotics. The antibiotics were administered within an appropriate time frame prior to the initiation of the procedure. ANESTHESIA/SEDATION: Fentanyl 100 mcg IV; Versed 2 mg IV Moderate Sedation Time:  15 minutes The patient was continuously monitored during the procedure by the interventional radiology nurse  under my direct supervision. COMPLICATIONS: None immediate. PROCEDURE: Informed written consent was obtained from the patient after a thorough discussion of the procedural risks, benefits and alternatives. All questions were addressed. A timeout was performed prior to the initiation of the procedure. A planning axial CT scan was performed. The fluid collections within the right psoas muscle were successfully identified. A suitable skin entry site was selected and marked. The overlying skin was sterilely prepped and draped in the standard fashion using chlorhexidine skin prep. Local anesthesia was attained by infiltration with 1% lidocaine. A small dermatotomy was made. Under intermittent CT guidance, an 18 gauge trocar needle was advanced into the fluid collection. Aspiration was then performed yielding approximately 18 mL of purulent fluid. Samples were sent for Gram stain and culture. The trocar needle was removed. Post aspiration CT imaging demonstrates no evidence of hematoma or other immediate complication. IMPRESSION: Successful CT-guided aspiration of a right psoas abscess yielding 18 mL purulent fluid which was sent for culture. Signed, Sterling Big, MD, RPVI [facility] Electronically Signed   By: Malachy Moan M.D.   On: 02/22/2019 14:57   Dg Chest Port 1 View  Result Date: 03/04/2019 CLINICAL DATA:  Pneumothorax EXAM: PORTABLE CHEST 1 VIEW COMPARISON:  Chest radiograph 03/03/2019. FINDINGS: Monitoring leads overlie the patient. Stable cardiac and mediastinal contours. Scattered nodules demonstrated throughout the lungs bilaterally. Similar heterogeneous opacities left lung base. Left chest tube remains in position. Similar small pneumothorax within the upper lateral left hemithorax. Thoracic spine degenerative changes. IMPRESSION: 1. Similar small pneumothorax within the upper lateral left hemithorax with chest tube in place. 2.  Scattered nodules throughout the lungs bilaterally. 3. Similar heterogeneous opacities left lung base. Electronically Signed   By: Annia Belt M.D.   On: 03/04/2019 09:39   Dg Chest Port 1 View  Result Date: 03/03/2019 CLINICAL DATA:  Left-sided pneumothorax. EXAM:  PORTABLE CHEST 1 VIEW COMPARISON:  03/02/2019.  CT FINDINGS: Left chest tube in stable position. Small left pneumothorax unchanged. Multiple bilateral cavitary pulmonary nodules consistent with septic emboli again noted. These have progressed in number and size from prior exam. Heart size normal. No acute bony abnormality. IMPRESSION: 1. Left chest tube in stable position. Stable small left pneumothorax. 2. Multiple cavitary pulmonary nodules consistent with septic emboli are again noted. These have progressed in number and size from prior exam. Electronically Signed   By: Maisie Fus  Register   On: 03/03/2019 10:04   Dg Chest Port 1 View  Result Date: 03/02/2019 CLINICAL DATA:  Follow-up pleural effusion. EXAM: PORTABLE CHEST 1 VIEW COMPARISON:  Chest x-ray 03/01/2019 and chest CT 02/28/2019 FINDINGS: The cardiac silhouette, mediastinal and hilar contours are stable. Stable left IJ central venous catheter. The left-sided pleural drainage catheter is in stable position. Interval decrease in size of the left-sided pneumothorax. Stable numerous cavitary lesions in both lungs consistent with septic emboli. IMPRESSION: Left chest tube in good position with interval decrease in size of the left-sided pneumothorax. Stable bilateral septic emboli and infiltrates. Electronically Signed   By: Rudie Meyer M.D.   On: 03/02/2019 08:23   Dg Chest Port 1 View  Result Date: 03/01/2019 CLINICAL DATA:  48 year old male with a history of chest tube placement EXAM: PORTABLE CHEST 1 VIEW COMPARISON:  CT chest 02/28/2019, plain film 02/28/2019 FINDINGS: Cardiomediastinal silhouette is unchanged in size and contour. Unchanged left IJ central venous catheter with the  tip appearing to terminate superior cavoatrial junction. Interval placement thoracostomy tube the left lung base. Components of pneumothorax in the upper lung persist with incomplete re-expansion of the lung. Opacity within the left mid and lower lung persists obscuring the left hemidiaphragm in the left heart border. Nodular opacities of the right lung persist. No right pneumothorax or right pleural effusion. IMPRESSION: Interval placement of left-sided thoracostomy tube at the lung base with incomplete re-expansion of the left lung and persisting pneumothorax/hydropneumothorax. Similar appearance of the lungs with multifocal septic emboli and consolidation/scarring at the left lung base. Unchanged left IJ central venous catheter. Electronically Signed   By: Gilmer Mor D.O.   On: 03/01/2019 12:43   Dg Chest Port 1 View  Addendum Date: 02/28/2019   ADDENDUM REPORT: 02/28/2019 19:27 ADDENDUM: Critical Value/emergent results were called by telephone at the time of interpretation on 02/28/2019 at 7:27 pm to providerKaren Craige Cotta, who verbally acknowledged these results. Electronically Signed   By: Donzetta Kohut M.D.   On: 02/28/2019 19:27   Result Date: 02/28/2019 CLINICAL DATA:  Elevated heart rate and dyspnea. EXAM: PORTABLE CHEST 1 VIEW COMPARISON:  02/24/2019 FINDINGS: New loculated left pneumothorax in the setting of cavitary lung lesions. Moderately large pneumothorax and areas of collapse/consolidation in the left lung base also with sub pulmonic lucency and deep sulcus sign. Concomitant left pleural effusion is suspected. Cavitary lesions in the right chest are unchanged. Mild rotation with probable slight mediastinal shift. Post extubation with left IJ central venous catheter terminating at the caval to atrial junction as before. No acute bone process. IMPRESSION: 1. New loculated left pneumothorax in the setting of cavitary lung lesions. Mediastinal shift while slight is present on the current study.  2. Stable cavitary lesions in the right chest. 3. Post extubation. 4. A call is out to the referring provider to further discuss above findings. Electronically Signed: By: Donzetta Kohut M.D. On: 02/28/2019 19:19   Dg Chest Port 1 View  Result Date:  02/24/2019 CLINICAL DATA:  Endotracheal tube EXAM: PORTABLE CHEST 1 VIEW COMPARISON:  Two days ago FINDINGS: Endotracheal tube tip is just below the clavicular heads. Left IJ line with tip at the upper cavoatrial junction. The orogastric tube tip reaches the stomach. Bilateral cavitary pneumonia with small left pleural effusion. No convincing change from prior. No visible air leak. IMPRESSION: 1. Unremarkable hardware positioning. 2. Unchanged multiple pulmonary cavities with left lower lobe pneumonia and small pleural effusion, findings of septic emboli. Electronically Signed   By: Marnee Spring M.D.   On: 02/24/2019 05:31   Dg Chest Port 1 View  Result Date: 02/22/2019 CLINICAL DATA:  Worsening dyspnea EXAM: PORTABLE CHEST 1 VIEW COMPARISON:  February 21, 2019 FINDINGS: The cardiomediastinal silhouette is unchanged from prior exam. There is worsening patchy airspace opacity at the left lung base. There are multiple rounded pulmonary nodules within the periphery of the right lower lung and left upper lung as on the prior exam. IMPRESSION: Worsening airspace opacity in the left lower lung which is concerning for pneumonia. Again noted are multiple bilateral rounded pulmonary nodular opacities throughout both lungs. This could be due to septic emboli. Electronically Signed   By: Jonna Clark M.D.   On: 02/22/2019 15:54   Dg Chest Port 1 View  Result Date: 02/21/2019 CLINICAL DATA:  Shortness of breath EXAM: PORTABLE CHEST 1 VIEW COMPARISON:  None. FINDINGS: There is airspace opacity in the left lower lobe with small left pleural effusion consistent with pneumonia. Within this area, there is a nodular appearing area measuring 3.1 x 2.7 cm. There is a  nodular opacity in the periphery of the left mid lung measuring 1.3 x 1.1 cm. There is a nodular opacity in the periphery of the right mid lung measuring 0.9 x 0.9 cm. There is an apparent cavitary lesion in the right upper lobe measuring 2.9 x 2.1 cm. There are smaller cavitary appearing lesions throughout the lungs bilaterally. Heart size and pulmonary vascularity are normal. No adenopathy. No bone lesions. IMPRESSION: 1.  Apparent pneumonia left base with small left pleural effusion. 2. Multiple nodular lesions, some of which appear cavitated. Question metastatic foci versus septic emboli. Advise contrast enhanced chest CT to further assess. No adenopathy appreciable by radiography.  Heart size normal. Electronically Signed   By: Bretta Bang III M.D.   On: 02/21/2019 20:50   Dg Abd Portable 1v  Result Date: 02/22/2019 CLINICAL DATA:  OG tube placement EXAM: PORTABLE ABDOMEN - 1 VIEW COMPARISON:  02/21/2019 FINDINGS: Esophageal tube tip overlies the mid gastric region, side-port in the region of the cardia. Small left-sided pleural effusion with airspace disease at the left base. Cavitary lung lesions. IMPRESSION: 1. Esophageal tube tip overlies the gastric body 2. Small left pleural effusion with airspace disease at the left base. Multiple cavitary lung lesions. Electronically Signed   By: Jasmine Pang M.D.   On: 02/22/2019 19:33   Vas [place] Lower Extremity Venous (dvt)  Result Date: 03/01/2019  Lower Venous Study Indications: Pulmonary embolism.  Risk Factors: None identified. Comparison Study: No prior studies. Performing Technologist: Chanda Busing RVT  Examination Guidelines: A complete evaluation includes B-mode imaging, spectral Doppler, color Doppler, and power Doppler as needed of all accessible portions of each vessel. Bilateral testing is considered an integral part of a complete examination. Limited examinations for reoccurring indications may be performed as noted. The reflux portion of  the exam is performed with the patient in reverse Trendelenburg.  +---------+---------------+---------+-----------+----------+--------------+  RIGHT  Compressibility Phasicity Spontaneity Properties Thrombus Aging  +---------+---------------+---------+-----------+----------+--------------+  CFV       Full            Yes       Yes                                    +---------+---------------+---------+-----------+----------+--------------+  SFJ       Full                                                             +---------+---------------+---------+-----------+----------+--------------+  FV Prox   Full                                                             +---------+---------------+---------+-----------+----------+--------------+  FV Mid    Full                                                             +---------+---------------+---------+-----------+----------+--------------+  FV Distal Full                                                             +---------+---------------+---------+-----------+----------+--------------+  PFV       Full                                                             +---------+---------------+---------+-----------+----------+--------------+  POP       Full            Yes       Yes                                    +---------+---------------+---------+-----------+----------+--------------+  PTV       Full                                                             +---------+---------------+---------+-----------+----------+--------------+  PERO      Full                                                             +---------+---------------+---------+-----------+----------+--------------+   +---------+---------------+---------+-----------+----------+--------------+  LEFT      Compressibility Phasicity Spontaneity Properties Thrombus Aging  +---------+---------------+---------+-----------+----------+--------------+  CFV       Full            Yes       Yes                                     +---------+---------------+---------+-----------+----------+--------------+  SFJ       Full                                                             +---------+---------------+---------+-----------+----------+--------------+  FV Prox   Full                                                             +---------+---------------+---------+-----------+----------+--------------+  FV Mid    Full                                                             +---------+---------------+---------+-----------+----------+--------------+  FV Distal Full                                                             +---------+---------------+---------+-----------+----------+--------------+  PFV       Full                                                             +---------+---------------+---------+-----------+----------+--------------+  POP       Full            Yes       Yes                                    +---------+---------------+---------+-----------+----------+--------------+  PTV       Full                                                             +---------+---------------+---------+-----------+----------+--------------+  PERO      Full                                                             +---------+---------------+---------+-----------+----------+--------------+  Summary: Right: No evidence of common femoral vein obstruction. There is no evidence of deep vein thrombosis in the lower extremity. No cystic structure found in the popliteal fossa. Left: There is no evidence of deep vein thrombosis in the lower extremity. No cystic structure found in the popliteal fossa.  *See table(s) above for measurements and observations. Electronically signed by Gretta Began MD on 03/01/2019 at 6:27:25 PM.    Final     Lab Data:  CBC: Recent Labs  Lab 02/28/19 0432  02/28/19 2023 03/01/19 0418 03/02/19 0500 03/04/19 0527 03/05/19 0233  WBC 9.4  --   --  10.3 7.9 9.0 10.5  HGB 7.1*   < > 9.4*  8.1* 7.7* 8.1* 10.8*  HCT 23.4*   < > 29.6* 26.5* 25.0* 26.1* 34.2*  MCV 92.5  --   --  91.1 90.9 92.2 91.9  PLT 182  --   --  189 170 205 116*   < > = values in this interval not displayed.   Basic Metabolic Panel: Recent Labs  Lab 02/27/19 0538 02/28/19 0432 03/01/19 0418 03/02/19 0500 03/03/19 1218 03/05/19 0233  NA 141 138 140 138 136 136  K 4.0 4.3 3.9 3.9 3.4* 3.9  CL 109 107 107 105 107 105  CO2 GLUCOSE 107* 104* 131* 106* 137* 158*  BUN 48* 34* 30* 27* 25* 16  CREATININE 0.53* 0.43* 0.48* 0.47* 0.44* 0.43*  CALCIUM 8.1* 7.9* 7.7* 7.7* 7.4* 7.4*  MG 1.7  --   --   --   --   --    GFR: Estimated Creatinine Clearance: 86.3 mL/min (A) (by C-G formula based on SCr of 0.43 mg/dL (L)). Liver Function Tests: No results for input(s): AST, ALT, ALKPHOS, BILITOT, PROT, ALBUMIN in the last 168 hours. No results for input(s): LIPASE, AMYLASE in the last 168 hours. No results for input(s): AMMONIA in the last 168 hours. Coagulation Profile: Recent Labs  Lab 02/28/19 2331  INR 1.4*   Cardiac Enzymes: No results for input(s): CKTOTAL, CKMB, CKMBINDEX, TROPONINI in the last 168 hours. BNP (last 3 results) No results for input(s): PROBNP in the last 8760 hours. HbA1C: No results for input(s): HGBA1C in the last 72 hours. CBG: Recent Labs  Lab 03/04/19 1216 03/04/19 1648 03/04/19 2128 03/05/19 0728 03/05/19 1052  GLUCAP 125* 127* 174* 90 170*   Lipid Profile: No results for input(s): CHOL, HDL, LDLCALC, TRIG, CHOLHDL, LDLDIRECT in the last 72 hours. Thyroid Function Tests: No results for input(s): TSH, T4TOTAL, FREET4, T3FREE, THYROIDAB in the last 72 hours. Anemia Panel: No results for input(s): VITAMINB12, FOLATE, FERRITIN, TIBC, IRON, RETICCTPCT in the last 72 hours. Urine analysis:    Component Value Date/Time   BILIRUBINUR neg 12/21/2017 1551   PROTEINUR Negative 12/21/2017 1551   UROBILINOGEN 0.2 12/21/2017 1551   NITRITE neg 12/21/2017  1551   LEUKOCYTESUR Negative 12/21/2017 1551     Donnamarie Shankles M.D. [facility] 03/05/2019, 12:48 PM  Pager: [phone] Between 7am to 7pm - call Pager - [phone]  After 7pm go to [email or web address] - password [email or web address]  Call night coverage person covering after 7pm

## 2019-03-05 NOTE — Progress Notes (Signed)
I met briefly with Mr. Rod.  He reports that he would like to wait until his friend, Marjorie Smolder, is present to discuss [place].  Will reach out to see when his friend to see when he is coming to visit.  NO CHARGE NOTE  Micheline Rough, MD [place] Team [phone]

## 2019-03-05 NOTE — Progress Notes (Signed)
Pt arrived to unit. Vital signs: BP 147/96; HR 108; RR 20; Temp 98.6; SPO2 96.

## 2019-03-05 NOTE — Progress Notes (Signed)
Pt chest tube set up w/ suction at continuous 30 cmH2O. Minimal intermittent bubbling observed. Tube is draining well. No c/o shortness of breath from patient. Will continue to monitor.

## 2019-03-06 ENCOUNTER — Inpatient Hospital Stay (HOSPITAL_COMMUNITY): Payer: Self-pay

## 2019-03-06 DIAGNOSIS — J9312 Secondary spontaneous pneumothorax: Secondary | ICD-10-CM

## 2019-03-06 DIAGNOSIS — Z9689 Presence of other specified functional implants: Secondary | ICD-10-CM

## 2019-03-06 DIAGNOSIS — R7881 Bacteremia: Secondary | ICD-10-CM

## 2019-03-06 DIAGNOSIS — L818 Other specified disorders of pigmentation: Secondary | ICD-10-CM

## 2019-03-06 DIAGNOSIS — J860 Pyothorax with fistula: Secondary | ICD-10-CM

## 2019-03-06 LAB — ECHOCARDIOGRAM COMPLETE
Height: 72 in
Weight: 2077.62 oz

## 2019-03-06 LAB — GLUCOSE, CAPILLARY
Glucose-Capillary: 148 mg/dL — ABNORMAL HIGH (ref 70–99)
Glucose-Capillary: 176 mg/dL — ABNORMAL HIGH (ref 70–99)
Glucose-Capillary: 152 mg/dL — ABNORMAL HIGH (ref 70–99)
Glucose-Capillary: 114 mg/dL — ABNORMAL HIGH (ref 70–99)

## 2019-03-06 NOTE — Progress Notes (Signed)
Lab draw attempted X 2, unsuccessful.

## 2019-03-06 NOTE — Progress Notes (Signed)
Discussed with Dr. Tana Coast.    Pain better controlled at this point and limits of care are clear and appropriate.  Will hold on consult at this time.  Please reconsult if we can be of further assistance.  Micheline Rough, MD [facility] Team [phone]  NO CHARGE NOTE

## 2019-03-06 NOTE — Progress Notes (Signed)
  Echocardiogram 2D Echocardiogram has been performed.  Franklin Anderson G Franklin Anderson 03/06/2019, 2:53 PM

## 2019-03-06 NOTE — Progress Notes (Signed)
..   NAME:  Franklin Anderson, MRN:  [id number], DOB:  December 26, 1970, LOS: 64 ADMISSION DATE:  02/21/2019, CONSULTATION DATE:  02/28/2019 REFERRING MD:  Earnest Conroy MD, CHIEF COMPLAINT:  SOB   Brief History   48 yr old M with PMHx of rheumatic fever, HTN, IV heroin abuse presented on 10/27 w/ back pain and lower ext weakness. 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 Recent TEE shows mobile mass moderate in size located in the mid/ apical  RV that appears to be attached to a trabeculation. NP overnight evaluated patient at shift change noted to be tachypneic. New CXR shows left sided loculated pneumothorax. PCCM consulted    Past Medical History  .Marland Kitchen Active Ambulatory Problems    Diagnosis Date Noted  . Shortness of breath 10/01/2015  . Dyspnea 10/01/2015  . HTN (hypertension) 10/01/2015  . 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 Heroin use 10/01/2015  . Tobacco abuse 10/01/2015  . Drug abuse ([place])   . Hypertension 04/27/2010  . H/O: rheumatic fever    Resolved Ambulatory Problems    Diagnosis Date Noted  . No Resolved Ambulatory Problems   No Additional Past Medical History     Significant Hospital Events    10/27 with worsening back pain and BLE weakness.  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 I nitially evaluated by PCCM on 10/28 when pt became obtunded and dyspneic. Pt was endotracheally intubated for acute hypoxemic respiratory failure. He received a Left IJ CVC on 10/28 with follow up CXR showing no complications. He received IV Antibiotics for metastatic MSSAB infection, initially on Vancomycin and Cefepime tapered to Ancef.  He was evaluated with bedside echo and found to have pericardial effusion without tamponade. Formal TTE showed G!DD w EF 60% and possible mitral valve vegetation.  Pt received TEE on 11/3 at [facility] (w/o intubation, tolerated procedure well) and was found to have  mobile mass moderate in size located in the mid/  apical  RV that appears to be attached to a trabeculation.  11/3: NP overnight evaluated patient at shift change noted the pt to be tachypneic. Pt reported to NP that he felt more SOB than baseline not not in acute distress with no significant desaturations. A New CXR was completed: results were called to [facility] by NP.  It shows a new left sided loculated pneumothorax.  11/4: Chest tube placed, marked improvement in pneumothorax 11/6: Pleural fluid volume much improved.  Ongoing 1 out of 7 airleak.  Suction from pleural tube increased to 30.  Awaiting cultures, currently has gram-positive cocci in clusters 11/9 still w/ airleak. Got symptomatic (more short of breath) when chest tube sxn was not functioning  Consults:  10/28>>Infectious Disease 10/28>>PCCM 10/29>> Cardiology 11/3>>> PCCM re-consult 11/4: Phone consult with thoracic surgery Procedures:  10/28>>CT guided aspiration of right psoas abscesses 10/28>>Endotracheally intubated 10/28>> LIJ CVC placement 10/28>>Bronchoscopy with BAL 10/31>>Extubated to 3L [place] by RT 11/3>>> Transesophageal Echo 11/4: Left-sided chest tube   Micro Data:  Bllood cx x 2 on 10/27>>> + MSSA Blood cx  On 10/28>>>>>>+ MSSA Abscess Rt Psoas>>MODERATE STAPHYLOCOCCUS AUREUS 10/28 resp cx on 10/28>>MODERATE STAPHYLOCOCCUS AUREUS  Repeat Blood cx on 10/30>>NGTD (4 days) Pleural fluid 11/4>>> Antimicrobials:  Abx course--Cefepime10/28 >>10/29 ,Vanc 10/28 >>10/30 Cefazolin 10/30>>   Subjective   Had severe shortness of breath. CT was not on adequate sxn. This immediately subsided w/ titration of sxn to appropriate level   Objective   Blood pressure (Abnormal) 142/99, pulse 97,  temperature 97.8 F (36.6 C), temperature source Oral, resp. rate 20, height 6' (1.829 m), weight 58.9 kg, SpO2 96 %.        Intake/Output Summary (Last 24 hours) at 03/06/2019 1124 Last data filed at 03/06/2019 0548 Gross per 24 hour  Intake 672 ml  Output 2370 ml  Net  -1698 ml   Filed Weights   03/04/19 0500 03/05/19 0500 03/06/19 0318  Weight: 53.9 kg 54 kg 58.9 kg    Examination: General this is a frail 48 year old male resting in bed. Now comfortable but had been having sig pain/respiratory distress HENT poor dentition temporal wasting No JVD Pulm scattered rhonchi pleural rub on left. 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 CT good position -airleak: 1 of 7 currently  Plan abx per ID Repeat CXR today  Cont CT to sxn at 30 Appreciate palliative care input  Will need to d/w cardiothoracics if BPF doesn't heal. I think he has limited surgical options.     Critical care time:NA     Erick Colace ACNP-BC [place] Pager # [phone] OR # [phone] if no answer

## 2019-03-06 NOTE — Progress Notes (Signed)
PT Cancellation Note  Patient Details Name: Franklin Anderson MRN: [id number] DOB: 07/19/1970   Cancelled Treatment:    Reason Eval/Treat Not Completed: Fatigue/lethargy limiting ability to participate;Medical issues which prohibited therapy. Pt reported he does not feel well today and c/o difficulty breathing. RN is aware and was assisting the patient.   Lelon Mast 03/06/2019, 11:17 AM

## 2019-03-06 NOTE — Progress Notes (Signed)
Patient requested pain medication. IV occluded unable to flush and unable to get blood return. RN went to return the medication to the [facility] pyxis 1. Pyxis allowed the RN to return but the cubie didn't open. RN tried to return to pyxis 2 but was unable to. After talking to the pharmacist, [place] and Surveyor, quantity, [place], 2mg  dilaudid was wasted in stericycle withTerri Simeon Craft, Therapist, sports, as witness.

## 2019-03-06 NOTE — Progress Notes (Signed)
Subjective:  Continues to feel better  Antibiotics:  Anti-infectives (From admission, onward)   Start     Dose/Rate Route Frequency Ordered Stop   02/24/19 2200  vancomycin (VANCOCIN) IVPB 1000 mg/200 mL premix  Status:  Discontinued     1,000 mg 200 mL/hr over 60 Minutes Intravenous Every 12 hours 02/24/19 1010 02/24/19 1253   02/24/19 1600  ceFAZolin (ANCEF) IVPB 2g/100 mL premix     2 g 200 mL/hr over 30 Minutes Intravenous Every 8 hours 02/24/19 1253     02/23/19 2200  vancomycin (VANCOCIN) IVPB 750 mg/150 ml premix  Status:  Discontinued     750 mg 150 mL/hr over 60 Minutes Intravenous Every 24 hours 02/23/19 0853 02/23/19 0909   02/23/19 2200  vancomycin (VANCOCIN) IVPB 750 mg/150 ml premix  Status:  Discontinued     750 mg 150 mL/hr over 60 Minutes Intravenous Every 12 hours 02/23/19 0909 02/24/19 1010   02/22/19 2200  vancomycin (VANCOCIN) IVPB 750 mg/150 ml premix  Status:  Discontinued     750 mg 150 mL/hr over 60 Minutes Intravenous Every 24 hours 02/22/19 0826 02/22/19 0827   02/22/19 2200  vancomycin (VANCOCIN) IVPB 1000 mg/200 mL premix  Status:  Discontinued     1,000 mg 200 mL/hr over 60 Minutes Intravenous Every 24 hours 02/22/19 0827 02/23/19 0853   02/22/19 0900  ceFEPIme (MAXIPIME) 2 g in sodium chloride 0.9 % 100 mL IVPB  Status:  Discontinued     2 g 200 mL/hr over 30 Minutes Intravenous Every 12 hours 02/22/19 0826 02/23/19 1135   02/21/19 2015  ceFEPIme (MAXIPIME) 2 g in sodium chloride 0.9 % 100 mL IVPB     2 g 200 mL/hr over 30 Minutes Intravenous  Once 02/21/19 2013 02/22/19 0031   02/21/19 2015  metroNIDAZOLE (FLAGYL) IVPB 500 mg     500 mg 100 mL/hr over 60 Minutes Intravenous  Once 02/21/19 2013 02/22/19 0031   02/21/19 2015  vancomycin (VANCOCIN) IVPB 1000 mg/200 mL premix     1,000 mg 200 mL/hr over 60 Minutes Intravenous  Once 02/21/19 2013 02/22/19 0200      Medications: Scheduled Meds: . amLODipine  10 mg Oral Daily  .  Chlorhexidine Gluconate Cloth  6 each Topical Daily  . feeding supplement (ENSURE ENLIVE)  237 mL Oral BID BM  . feeding supplement (PRO-STAT SUGAR FREE 64)  30 mL Oral BID  . insulin aspart  0-24 Units Subcutaneous TID AC & HS  . mouth rinse  15 mL Mouth Rinse BID  . metoprolol tartrate  50 mg Oral BID  . multivitamin with minerals  1 tablet Oral Daily  . oxyCODONE  10 mg Oral Q12H  . pantoprazole  40 mg Oral Daily  . sodium chloride flush  10-40 mL Intracatheter Q12H   Continuous Infusions: .  ceFAZolin (ANCEF) IV 2 g (03/06/19 0541)  . sodium chloride     PRN Meds:.acetaminophen **OR** acetaminophen, acetaminophen, clonazePAM, hydrALAZINE, HYDROmorphone (DILAUDID) injection, ipratropium-albuterol, ondansetron **OR** ondansetron (ZOFRAN) IV, oxyCODONE-acetaminophen, senna-docusate, sodium chloride flush    Objective: Weight change: 4.9 kg  Intake/Output Summary (Last 24 hours) at 03/06/2019 1328 Last data filed at 03/06/2019 0548 Gross per 24 hour  Intake 436 ml  Output 2370 ml  Net -1934 ml   Blood pressure (!) 142/99, pulse 97, temperature 97.8 F (36.6 C), temperature source Oral, resp. rate 20, height 6' (1.829 m), weight 58.9 kg, SpO2 96 %. Temp:  [97.8  F (36.6 C)-99.6 F (37.6 C)] 97.8 F (36.6 C) (11/09 0540) Pulse Rate:  [97-126] 97 (11/09 0540) Resp:  [18-20] 20 (11/09 0540) BP: (134-150)/(81-99) 142/99 (11/09 0540) SpO2:  [96 %-98 %] 96 % (11/09 0540) Weight:  [58.9 kg] 58.9 kg (11/09 0318)  Physical Exam: General: Alert and awake, oriented x3,cachectic, facial wasting, sitting up and eating breakfast HEENT: anicteric sclera, EOMI CVStach rate, normal  No mgr Chest: , coarse breath sounds, CT in place with bloody , purulent material Abdomen: soft non-distended,  Extremities: 3+ edema in LE Skin: no rashes, + tatooos Neuro: nonfocal  CBC:    BMET Recent Labs    03/05/19 0233  NA 136  K 3.9  CL 105  CO2 24  GLUCOSE 158*  BUN 16  CREATININE  0.43*  CALCIUM 7.4*     Liver Panel  No results for input(s): PROT, ALBUMIN, AST, ALT, ALKPHOS, BILITOT, BILIDIR, IBILI in the last 72 hours.     Sedimentation Rate No results for input(s): ESRSEDRATE in the last 72 hours. C-Reactive Protein No results for input(s): CRP in the last 72 hours.  Micro Results: Recent Results (from the past 720 hour(s))  Blood Culture (routine x 2)     Status: Abnormal   Collection Time: 02/21/19  8:13 PM   Specimen: BLOOD LEFT FOREARM  Result Value Ref Range Status   Specimen Description   Final    BLOOD LEFT FOREARM Performed at [facility], [address] [place], [place] [address]    Special Requests   Final    BOTTLES DRAWN AEROBIC AND ANAEROBIC Blood Culture results may not be optimal due to an inadequate volume of blood received in culture bottles Performed at [facility] [address] [place], [place] [address]    Culture  Setup Time   Final    GRAM POSITIVE COCCI IN BOTH AEROBIC AND ANAEROBIC BOTTLES CRITICAL RESULT CALLED TO, READ BACK BY AND VERIFIED WITH: PHARMD J LEGGE [id number] AT 1337 BY CM CRITICAL RESULT CALLED TO, READ BACK BY AND VERIFIED WITH: PHARMD E JACKSON [id number] AT 1357 BY CM Performed at [facility], [place] [address] [place], [place] [address]    Culture STAPHYLOCOCCUS AUREUS (A)  Final   Report Status 02/24/2019 FINAL  Final   Organism ID, Bacteria STAPHYLOCOCCUS AUREUS  Final      Susceptibility   Staphylococcus aureus - MIC*    CIPROFLOXACIN <=0.5 SENSITIVE Sensitive     ERYTHROMYCIN >=8 RESISTANT Resistant     GENTAMICIN <=0.5 SENSITIVE Sensitive     OXACILLIN 0.5 SENSITIVE Sensitive     TETRACYCLINE <=1 SENSITIVE Sensitive     VANCOMYCIN 1 SENSITIVE Sensitive     TRIMETH/SULFA <=10 SENSITIVE Sensitive     CLINDAMYCIN <=0.25 SENSITIVE Sensitive     RIFAMPIN <=0.5 SENSITIVE Sensitive     Inducible Clindamycin NEGATIVE Sensitive     * STAPHYLOCOCCUS AUREUS  SARS  Coronavirus 2 by RT PCR (hospital order, performed in [place] hospital lab) Nasopharyngeal Nasopharyngeal Swab     Status: None   Collection Time: 02/21/19  8:16 PM   Specimen: Nasopharyngeal Swab  Result Value Ref Range Status   SARS Coronavirus 2 NEGATIVE NEGATIVE Final    Comment: (NOTE) If result is NEGATIVE SARS-CoV-2 target nucleic acids are NOT DETECTED. The SARS-CoV-2 RNA is generally detectable in upper and lower  respiratory specimens during the acute phase of infection. The lowest  concentration of SARS-CoV-2 viral copies this assay can  detect is 250  copies / mL. A negative result does not preclude SARS-CoV-2 infection  and should not be used as the sole basis for treatment or other  patient management decisions.  A negative result may occur with  improper specimen collection / handling, submission of specimen other  than nasopharyngeal swab, presence of viral mutation(s) within the  areas targeted by this assay, and inadequate number of viral copies  (<250 copies / mL). A negative result must be combined with clinical  observations, patient history, and epidemiological information. If result is POSITIVE SARS-CoV-2 target nucleic acids are DETECTED. The SARS-CoV-2 RNA is generally detectable in upper and lower  respiratory specimens dur ing the acute phase of infection.  Positive  results are indicative of active infection with SARS-CoV-2.  Clinical  correlation with patient history and other diagnostic information is  necessary to determine patient infection status.  Positive results do  not rule out bacterial infection or co-infection with other viruses. If result is PRESUMPTIVE POSTIVE SARS-CoV-2 nucleic acids MAY BE PRESENT.   A presumptive positive result was obtained on the submitted specimen  and confirmed on repeat testing.  While 2019 novel coronavirus  (SARS-CoV-2) nucleic acids may be present in the submitted sample  additional confirmatory testing may be  necessary for epidemiological  and / or clinical management purposes  to differentiate between  SARS-CoV-2 and other Sarbecovirus currently known to infect humans.  If clinically indicated additional testing with an alternate test  methodology [phone]) is advised. The SARS-CoV-2 RNA is generally  detectable in upper and lower respiratory sp ecimens during the acute  phase of infection. The expected result is Negative. Fact Sheet for Patients:  StrictlyIdeas.no Fact Sheet for Healthcare Providers: BankingDealers.co.za This test is not yet approved or cleared by the [place] FDA and has been authorized for detection and/or diagnosis of SARS-CoV-2 by FDA under an Emergency Use Authorization (EUA).  This EUA will remain in effect (meaning this test can be used) for the duration of the COVID-19 declaration under Section 564(b)(1) of the Act, 21 U.S.C. section 360bbb-3(b)(1), unless the authorization is terminated or revoked sooner. Performed at [facility], [place] [address], [place], [place] [address]   MRSA PCR Screening     Status: None   Collection Time: 02/22/19  4:56 AM   Specimen: Nasal Mucosa; Nasopharyngeal  Result Value Ref Range Status   MRSA by PCR NEGATIVE NEGATIVE Final    Comment:        The GeneXpert MRSA Assay (FDA approved for NASAL specimens only), is one component of a comprehensive MRSA colonization surveillance program. It is not intended to diagnose MRSA infection nor to guide or monitor treatment for MRSA infections. Performed at [facility], [place] [address] [place], [place] [address]   Blood Culture (routine x 2)     Status: Abnormal   Collection Time: 02/22/19  5:33 AM   Specimen: BLOOD  Result Value Ref Range Status   Specimen Description   Final    BLOOD LEFT ANTECUBITAL Performed at [facility] [address], [place], [place] [address]     Special Requests   Final    BOTTLES DRAWN AEROBIC AND ANAEROBIC Blood Culture adequate volume Performed at [facility] [employer] [address], [place], [place] [address]    Culture  Setup Time   Final    GRAM POSITIVE COCCI ANAEROBIC BOTTLE ONLY CRITICAL RESULT CALLED TO, READ BACK BY AND VERIFIED WITH: PHARMD E  JACKSON [id number] AT 1108 BY CM    Culture (A)  Final    STAPHYLOCOCCUS AUREUS SUSCEPTIBILITIES PERFORMED ON PREVIOUS CULTURE WITHIN THE LAST 5 DAYS. Performed at [facility], [place] [address], [place], [place] [address]    Report Status 02/24/2019 FINAL  Final  Aerobic/Anaerobic Culture (surgical/deep wound)     Status: None   Collection Time: 02/22/19 10:05 AM   Specimen: Abscess  Result Value Ref Range Status   Specimen Description   Final    ABSCESS RT PSOAS Performed at [facility] [address], [place], [place] [address]    Special Requests   Final    NONE Performed at [facility], [place] [address], [place], [place] [address]    Gram Stain   Final    RARE WBC PRESENT,BOTH PMN AND MONONUCLEAR FEW GRAM POSITIVE COCCI IN PAIRS    Culture   Final    MODERATE STAPHYLOCOCCUS AUREUS NO ANAEROBES ISOLATED Performed at [facility], [place] [address], [place], [place] [address]    Report Status 02/27/2019 FINAL  Final   Organism ID, Bacteria STAPHYLOCOCCUS AUREUS  Final      Susceptibility   Staphylococcus aureus - MIC*    CIPROFLOXACIN <=0.5 SENSITIVE Sensitive     ERYTHROMYCIN >=8 RESISTANT Resistant     GENTAMICIN <=0.5 SENSITIVE Sensitive     OXACILLIN 0.5 SENSITIVE Sensitive     TETRACYCLINE <=1 SENSITIVE Sensitive     VANCOMYCIN <=0.5 SENSITIVE Sensitive     TRIMETH/SULFA <=10 SENSITIVE Sensitive     CLINDAMYCIN <=0.25 SENSITIVE Sensitive     RIFAMPIN <=0.5 SENSITIVE Sensitive     Inducible Clindamycin NEGATIVE Sensitive     * MODERATE STAPHYLOCOCCUS AUREUS  Culture, respiratory  (non-expectorated)     Status: None   Collection Time: 02/22/19  5:30 PM   Specimen: Bronchoalveolar Lavage; Respiratory  Result Value Ref Range Status   Specimen Description   Final    BRONCHIAL ALVEOLAR LAVAGE Performed at [facility] [address], [place], [place] [address]    Special Requests   Final    NONE Performed at [facility], [place] [address], [place], [place] [address]    Gram Stain   Final    ABUNDANT WBC PRESENT, PREDOMINANTLY PMN MODERATE GRAM POSITIVE COCCI Performed at [facility], [place] [address], [place], [place] [address]    Culture MODERATE STAPHYLOCOCCUS AUREUS  Final   Report Status 02/25/2019 FINAL  Final   Organism ID, Bacteria STAPHYLOCOCCUS AUREUS  Final      Susceptibility   Staphylococcus aureus - MIC*    CIPROFLOXACIN <=0.5 SENSITIVE Sensitive     ERYTHROMYCIN >=8 RESISTANT Resistant     GENTAMICIN <=0.5 SENSITIVE Sensitive     OXACILLIN 0.5 SENSITIVE Sensitive     TETRACYCLINE <=1 SENSITIVE Sensitive     VANCOMYCIN 1 SENSITIVE Sensitive     TRIMETH/SULFA <=10 SENSITIVE Sensitive     CLINDAMYCIN <=0.25 SENSITIVE Sensitive     RIFAMPIN <=0.5 SENSITIVE Sensitive     Inducible Clindamycin NEGATIVE Sensitive     * MODERATE STAPHYLOCOCCUS AUREUS  Culture, blood (Routine X 2) w Reflex to ID Panel     Status: None   Collection Time: 02/24/19 11:47 AM   Specimen: BLOOD LEFT HAND  Result Value Ref Range Status   Specimen Description   Final    BLOOD LEFT HAND Performed at [facility] [address], [place], [place] [address]  Special Requests   Final    BOTTLES DRAWN AEROBIC AND ANAEROBIC Blood Culture adequate volume Performed at [facility] [address] [place], [place] [address]    Culture   Final    NO GROWTH 5 DAYS Performed at [facility], [place] [address], [place], [place] [address]    Report Status 03/01/2019 FINAL  Final  Body fluid  culture     Status: None   Collection Time: 03/01/19  1:30 PM   Specimen: Chest; Body Fluid  Result Value Ref Range Status   Specimen Description   Final    CHEST Performed at [facility] [address], [place], [place] [address]    Special Requests   Final    Normal Performed at [facility], [place] [address], [place], [place] [address]    Gram Stain   Final    MODERATE WBC PRESENT, PREDOMINANTLY PMN FEW GRAM POSITIVE COCCI IN CLUSTERS Performed at [facility], [place] [address], [place], [place] [address]    Culture RARE STAPHYLOCOCCUS AUREUS  Final   Report Status 03/04/2019 FINAL  Final   Organism ID, Bacteria STAPHYLOCOCCUS AUREUS  Final      Susceptibility   Staphylococcus aureus - MIC*    CIPROFLOXACIN <=0.5 SENSITIVE Sensitive     ERYTHROMYCIN >=8 RESISTANT Resistant     GENTAMICIN <=0.5 SENSITIVE Sensitive     OXACILLIN 0.5 SENSITIVE Sensitive     TETRACYCLINE <=1 SENSITIVE Sensitive     VANCOMYCIN <=0.5 SENSITIVE Sensitive     TRIMETH/SULFA <=10 SENSITIVE Sensitive     CLINDAMYCIN <=0.25 SENSITIVE Sensitive     RIFAMPIN <=0.5 SENSITIVE Sensitive     Inducible Clindamycin NEGATIVE Sensitive     * RARE STAPHYLOCOCCUS AUREUS  Culture, blood (routine x 2)     Status: None (Preliminary result)   Collection Time: 03/02/19  7:10 PM   Specimen: BLOOD RIGHT ARM  Result Value Ref Range Status   Specimen Description   Final    BLOOD RIGHT ARM Performed at [facility], [address], [place], [place] [address]    Special Requests   Final    BOTTLES DRAWN AEROBIC AND ANAEROBIC Blood Culture adequate volume Performed at [facility] [address], [place], [place] [address]    Culture   Final    NO GROWTH 4 DAYS Performed at [facility], [place] [address] [place], [place] [address]    Report Status PENDING  Incomplete  Culture, blood (routine x 2)     Status: None (Preliminary result)    Collection Time: 03/02/19  7:17 PM   Specimen: BLOOD LEFT ARM  Result Value Ref Range Status   Specimen Description   Final    BLOOD LEFT ARM Performed at [facility], [place] [address], [place], [place] [address]    Special Requests   Final    BOTTLES DRAWN AEROBIC ONLY Blood Culture adequate volume Performed at [facility] [address] [place], [place] [address]    Culture   Final    NO GROWTH 4 DAYS Performed at [facility], [place] [address], [place], [place] [address]    Report Status PENDING  Incomplete    Studies/Results: No results found.    Assessment/Plan:  INTERVAL HISTORY: blood cultures remain NGTD post central line removal  Principal Problem:   Sepsis ([place]) Active Problems:   Dyspnea   Heroin use  Endocarditis   Septic pulmonary embolism (HCC)   Osteomyelitis of thoracic spine (HCC)   Osteomyelitis of lumbar spine (HCC)   Acute encephalopathy   AKI (acute kidney injury) ([place])   Transaminitis   Psoas muscle abscess (HCC)   Pressure injury of skin   Mucus clot in bronchi   Protein-calorie malnutrition, severe   Abnormal echocardiogram   MSSA bacteremia   Acute pulmonary embolism (HCC)   Loculated pleural effusion   Empyema, left ([place])    Franklin Anderson is a 48 y.o. 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  There were no evidence of vegetations on the mitral valve but clearly he had them since he has septic embolization to the lungs with cavitary pathology and now loculated pneumothorax concerning for empyema he also has a pulmonary embolism. He has BP fistula.  He is sp Chest TUBE placement He does also have a small pericardial effusion.  #1 Metastatic MSSAB as described:       [facility] Staphylococcus aureus bacteremia   Staphylococcus aureus bacteremia (SAB) is  associated with a high rate of complications and mortality.  Specific aspects of clinical management are critical to optimizing the outcome of patients with SAB.  Therefore, the [facility] Antimicrobial Management Team [facility]) has initiated an intervention aimed at improving the management of SAB at [facility].  To do so, Infectious Diseases physicians are providing an evidence-based consult for the management of all patients with SAB.     Yes No Comments  Perform follow-up blood cultures (even if the patient is afebrile) to ensure clearance of bacteremia _0  _1  Repeated post central line removal  Remove vascular catheter and obtain follow-up blood cultures after the removal of the catheter _2  _3  CENTRA LINE out AVOID new central lines until we can prove sterilization of blood  Perform echocardiography to evaluate for endocarditis (transthoracic ECHO is 40-50% sensitive, TEE is > 90% sensitive) _4  _5  TEE negative but undoubtedly has simply thrown vegetqation into lungs and he has pericardial effusion  Consult electrophysiologist to evaluate implanted cardiac device (pacemaker, ICD) _6  _7    Ensure source control _8  _9  Need to monitor Neuro exam, CT tube in place but might need decortication, agree with repeat CT next week  Investigate for "metastatic" sites of infection _10  _11  Does the patient have ANY symptom or physical exam finding that would suggest a deeper infection (back or neck pain that may be suggestive of vertebral osteomyelitis or epidural abscess, muscle pain that could be a symptom of pyomyositis)?  Keep in mind that for deep seeded infections MRI imaging with contrast is preferred rather than other often insensitive tests such as plain x-rays, especially early in a patient's presentation.  Change antibiotic therapy to cefazolin _12  _13  Beta-lactam antibiotics are preferred for MSSA due to higher cure rates.   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    LOS: 12 days   Alcide Evener 03/06/2019, 1:28 PM

## 2019-03-06 NOTE — Progress Notes (Signed)
OT Cancellation Note  Patient Details Name: Franklin Anderson MRN: [id number] DOB: [birth year]   Cancelled Treatment:    Reason Eval/Treat Not Completed: Other (comment) Pt asking for pain medication when OT presents for treatment. Upon speaking with nurse, pt has had pain medication this AM and is not yet due for another dose. Pt declines to work with OT at this time citing pain. Will f/u as able for OT tx on next scheduled date.   Jake Church Duran Ohern 03/06/2019, 10:31 AM

## 2019-03-06 NOTE — Progress Notes (Addendum)
[facility]                                                                              Patient Demographics  Franklin Anderson, is a 48 y.o. male, DOB - 03-Jan-1971, ONG:[id number]  Admit date - 02/21/2019   Admitting Physician Hillary Bow, DO  Outpatient Primary MD for the patient is Julieanne Manson, MD  Outpatient specialists:   LOS - 12  days   Medical records reviewed and are as summarized below:    Chief Complaint  Patient presents with   Back Pain   Heroin Detox   Weight Loss       Brief summary   47 y.o.malewith medical history significant of rheumatic fever, HTN, ongoingIV heroin abuse, presented to the ED on 10/27 with 3 week course of progressively worsening back pain and BLE weakness associated with some bowel incontinence and trouble walking. Last use of heroin was on the day of admission. ED Course:Found to have:sepsis with Tm 100, HR 126, RR 31, WBC 22.4k,AKI with BUN 111 and creat 1.7. Imaging studies revealed osteomyelitis and diskitis of L4-L5,septic pulmonary emboli and B psoas abscesses. Patient recieved 2L NS bolus, put on cefepime / vanc / flagyl initially, admitted to [facility] for Mx of sepsis and associated metabolic encephalopathy.  Imaging studies revealed osteomyelitis and discitis of L4-L5, septic pulmonary emboli, psoas abscess.  Blood cultures 10/27> MSSA.  On 10/28 was found to be obtunded and dyspneic and was intubated by critical care, extubated on 10/31.  Right psoas abscess was aspirated on 10/28, wound care > MSSA.  Respiratory culture from 10/28 also grew MSSA. 2D echo revealed grade 1 diastolic dysfunction, possible mitral valve vegetation.  MRI head also showed abnormal dural enhancement.  Additional comorbidities included hyper natremia, persistent uremia, pericardial effusion, severe deconditioning. 11/3: Patient underwent TEE and had acute respiratory distress later that evening with stat chest x-ray showing loculated  pneumothorax/CT chest indicating empyema/PE/hydropneumothorax.  Now seen by PCCM and post chest tube placement.Code status changed to DNR  Assessment & Plan    Principal Problem: Acute hypoxic respiratory failure, acute respiratory distress on 11/3 with CT findings of empyema -Extubated since 10/31.  Respiratory cultures grew MSSA. -2D echo showed possible mitral valve vegetation, underwent TEE on 11/3, noted to be in respiratory distress. -Chest x-ray showed loculated pneumothorax with mediastinal shift.  CCM was reconsulted.  CT chest showed widespread septic emboli with more central pulmonary artery filling defects seen within the left lower lobe, positive for acute PE with CT evidence of right heart strain.  No pneumo mediastinum.  Left large hydropneumothorax, concerning for empyema. -Patient underwent chest tube/drainage of empyema by CCM on 11/4, cultures >MSSA. - Cardiology recommended discontinuing heparin drip that was started for PE as likely representative of septic emboli -Continue chest tube, pulmonology following - antibiotics per ID, Dr. Algis Liming   Active Problems:  MSSA bacteremia/sepsis with discitis/vertebral osteomyelitis/psoas abscess/septic pulmonary emboli/cavitary lesion, empyema, mural vegetation -In the setting of IV drug abuse.  Blood cultures, so was cultures and respiratory cultures growing MSSA. -will plan on PICC line once cleared by ID.  Poor peripheral IV access due  to drug abuse history. -ID following, recommended repeat CT next week, currently chest tube in place, may need decortication -Moving all 4 extremities, no focal weakness -limited echo on 11/4 had shown circumferential  moderate pericardial effusion, no evidence of tamponade, will repeat 2D echo  Psoas abscess -Status post CT-guided right psoas aspiration by IR on 10/28 yielding 18 mL of purulent fluid, cultures grew MSSA -Per patient, pain better controlled from yesterday after starting  long-acting OxyContin 10 mg every 12 hours.    Pericardial effusion - limited echo on 11/4 had shown circumferential  moderate pericardial effusion, no evidence of tamponade -Follow 2D echo  Hypernatremia Sodium level now improved, tolerating diet.  Sodium 148 on 10/31 Sodium level stable 136  AKI, uremia -On admission, creatinine 1.7 with lactic acidosis 2.1 -Mental status at baseline, creatinine 0.4  Acute metabolic encephalopathy -In the setting of sepsis, widespread septic emboli,  Uremia Mental status at baseline, alert and oriented.  Normocytic anemia -Possibly anemia of chronic disease versus hemolytic in the setting of endocarditis.   H&H currently stable  Uncontrolled hypertension -BP stable, continue metoprolol, Norvasc.  Hydralazine IV as needed with parameters.    Pressure injury Sacrum with stage I, wound care per nursing  Severe protein calorie malnutrition, hypoalbuminemia, generalized deconditioning -Albumin level 1.4 on 10/31, recheck  Code Status: DNR status DVT Prophylaxis: Heparin subcu Family Communication: Discussed all imaging results, lab results, explained to the patient   Disposition Plan: Per ID, pulmonary Time Spent in minutes 25 minutes  Procedures:    Consultants:   Cardiology CCM Infectious disease Palliative care  Antimicrobials:   Anti-infectives (From admission, onward)   Start     Dose/Rate Route Frequency Ordered Stop   02/24/19 2200  vancomycin (VANCOCIN) IVPB 1000 mg/200 mL premix  Status:  Discontinued     1,000 mg 200 mL/hr over 60 Minutes Intravenous Every 12 hours 02/24/19 1010 02/24/19 1253   02/24/19 1600  ceFAZolin (ANCEF) IVPB 2g/100 mL premix     2 g 200 mL/hr over 30 Minutes Intravenous Every 8 hours 02/24/19 1253     02/23/19 2200  vancomycin (VANCOCIN) IVPB 750 mg/150 ml premix  Status:  Discontinued     750 mg 150 mL/hr over 60 Minutes Intravenous Every 24 hours 02/23/19 0853 02/23/19 0909   02/23/19 2200   vancomycin (VANCOCIN) IVPB 750 mg/150 ml premix  Status:  Discontinued     750 mg 150 mL/hr over 60 Minutes Intravenous Every 12 hours 02/23/19 0909 02/24/19 1010   02/22/19 2200  vancomycin (VANCOCIN) IVPB 750 mg/150 ml premix  Status:  Discontinued     750 mg 150 mL/hr over 60 Minutes Intravenous Every 24 hours 02/22/19 0826 02/22/19 0827   02/22/19 2200  vancomycin (VANCOCIN) IVPB 1000 mg/200 mL premix  Status:  Discontinued     1,000 mg 200 mL/hr over 60 Minutes Intravenous Every 24 hours 02/22/19 0827 02/23/19 0853   02/22/19 0900  ceFEPIme (MAXIPIME) 2 g in sodium chloride 0.9 % 100 mL IVPB  Status:  Discontinued     2 g 200 mL/hr over 30 Minutes Intravenous Every 12 hours 02/22/19 0826 02/23/19 1135   02/21/19 2015  ceFEPIme (MAXIPIME) 2 g in sodium chloride 0.9 % 100 mL IVPB     2 g 200 mL/hr over 30 Minutes Intravenous  Once 02/21/19 2013 02/22/19 0031   02/21/19 2015  metroNIDAZOLE (FLAGYL) IVPB 500 mg     500 mg 100 mL/hr over 60 Minutes Intravenous  Once 02/21/19 2013  02/22/19 0031   02/21/19 2015  vancomycin (VANCOCIN) IVPB 1000 mg/200 mL premix     1,000 mg 200 mL/hr over 60 Minutes Intravenous  Once 02/21/19 2013 02/22/19 0200         Medications  Scheduled Meds:  amLODipine  10 mg Oral Daily   Chlorhexidine Gluconate Cloth  6 each Topical Daily   feeding supplement (ENSURE ENLIVE)  237 mL Oral BID BM   feeding supplement (PRO-STAT SUGAR FREE 64)  30 mL Oral BID   insulin aspart  0-24 Units Subcutaneous TID AC & HS   mouth rinse  15 mL Mouth Rinse BID   metoprolol tartrate  50 mg Oral BID   multivitamin with minerals  1 tablet Oral Daily   oxyCODONE  10 mg Oral Q12H   pantoprazole  40 mg Oral Daily   sodium chloride flush  10-40 mL Intracatheter Q12H   Continuous Infusions:   ceFAZolin (ANCEF) IV 2 g (03/06/19 0541)   sodium chloride     PRN Meds:.acetaminophen **OR** acetaminophen, acetaminophen, clonazePAM, hydrALAZINE, HYDROmorphone  (DILAUDID) injection, ipratropium-albuterol, ondansetron **OR** ondansetron (ZOFRAN) IV, oxyCODONE-acetaminophen, senna-docusate, sodium chloride flush      Subjective:   Lazar Tierce was seen and examined today.  Pain better controlled after starting long-acting pain medication yesterday.  BM today.  No fevers or chills.  Patient denies dizziness, shortness of breath, abdominal pain, N/V/D/C, new weakness, numbess, tingling.  No acute events overnight.  Objective:   Vitals:   03/05/19 2042 03/06/19 0318 03/06/19 0540 03/06/19 1336  BP: (!) 150/97  (!) 142/99 135/79  Pulse: (!) 126  97 88  Resp: 18  20 20   Temp: 99.6 F (37.6 C)  97.8 F (36.6 C) 98 F (36.7 C)  TempSrc: Oral  Oral Oral  SpO2: 96%  96% 95%  Weight:  58.9 kg    Height:        Intake/Output Summary (Last 24 hours) at 03/06/2019 1437 Last data filed at 03/06/2019 1336 Gross per 24 hour  Intake 1263 ml  Output 3120 ml  Net -1857 ml     Wt Readings from Last 3 Encounters:  03/06/19 58.9 kg  12/21/17 65.3 kg  04/22/16 68 kg    Physical Exam  General: Alert and oriented x 3, NAD  Eyes:   HEENT:  Atraumatic, normocephalic  Cardiovascular: S1 S2 clear, no murmurs, RRR. + pedal edema b/l  Respiratory: Decreased breath sound the bases, chest tube to the left  Gastrointestinal: Soft, nontender, nondistended, NBS  Ext: + pedal edema bilaterally  Neuro: no new deficits  Musculoskeletal: No cyanosis, clubbing  Skin: No rashes  Psych: Normal affect and demeanor, alert and oriented x3    Data Reviewed:  I have personally reviewed following labs and imaging studies  Micro Results Recent Results (from the past 240 hour(s))  Body fluid culture     Status: None   Collection Time: 03/01/19  1:30 PM   Specimen: Chest; Body Fluid  Result Value Ref Range Status   Specimen Description   Final    CHEST Performed at [facility], [place] [address], [place], [place] [address]    Special  Requests   Final    Normal Performed at [facility], [place] [address], [place], [place] [address]    Gram Stain   Final    MODERATE WBC PRESENT, PREDOMINANTLY PMN FEW GRAM POSITIVE COCCI IN CLUSTERS Performed at [facility], [place] [address], [place], [place] [address]  Culture RARE STAPHYLOCOCCUS AUREUS  Final   Report Status 03/04/2019 FINAL  Final   Organism ID, Bacteria STAPHYLOCOCCUS AUREUS  Final      Susceptibility   Staphylococcus aureus - MIC*    CIPROFLOXACIN <=0.5 SENSITIVE Sensitive     ERYTHROMYCIN >=8 RESISTANT Resistant     GENTAMICIN <=0.5 SENSITIVE Sensitive     OXACILLIN 0.5 SENSITIVE Sensitive     TETRACYCLINE <=1 SENSITIVE Sensitive     VANCOMYCIN <=0.5 SENSITIVE Sensitive     TRIMETH/SULFA <=10 SENSITIVE Sensitive     CLINDAMYCIN <=0.25 SENSITIVE Sensitive     RIFAMPIN <=0.5 SENSITIVE Sensitive     Inducible Clindamycin NEGATIVE Sensitive     * RARE STAPHYLOCOCCUS AUREUS  Culture, blood (routine x 2)     Status: None (Preliminary result)   Collection Time: 03/02/19  7:10 PM   Specimen: BLOOD RIGHT ARM  Result Value Ref Range Status   Specimen Description   Final    BLOOD RIGHT ARM Performed at [facility], [address] [place], [place] [address]    Special Requests   Final    BOTTLES DRAWN AEROBIC AND ANAEROBIC Blood Culture adequate volume Performed at [facility], [address], [place], [place] [address]    Culture   Final    NO GROWTH 4 DAYS Performed at [facility], [address] [place], [place] [address]    Report Status PENDING  Incomplete  Culture, blood (routine x 2)     Status: None (Preliminary result)   Collection Time: 03/02/19  7:17 PM   Specimen: BLOOD LEFT ARM  Result Value Ref Range Status   Specimen Description   Final    BLOOD LEFT ARM Performed at [facility], [address] [place], [place] [address]    Special Requests   Final    BOTTLES DRAWN AEROBIC  ONLY Blood Culture adequate volume Performed at [facility], [address] [place], [place] [address]    Culture   Final    NO GROWTH 4 DAYS Performed at [facility], [address] [place], [place] [address]    Report Status PENDING  Incomplete    Radiology Reports Dg Skull 1-3 Views  Result Date: 02/21/2019 CLINICAL DATA:  48 year old male with MRI clearance. EXAM: SKULL - 1-3 VIEW COMPARISON:  None. FINDINGS: There is no evidence of skull fracture or other focal bone lesions. There is opacification of the right frontal sinus. Nasal and tongue piercings noted. No other radiopaque foreign object identified. IMPRESSION: 1. Nose ring and tongue piercing. No other radiopaque/metallic foreign object. 2. No evidence of skull fracture or other focal bone lesions. 3. Opacification of the right frontal sinus. . Electronically Signed   By: Elgie Collard M.D.   On: 02/21/2019 22:21   Dg Chest 1 View  Result Date: 03/03/2019 CLINICAL DATA:  48 year old male with increased shortness of breath EXAM: CHEST  1 VIEW COMPARISON:  Multiple prior comparison is most recently 03/03/2019, dating to 02/28/2019 FINDINGS: Cardiomediastinal silhouette unchanged in size and contour. Nodules throughout the lungs again noted, compatible with known septic emboli. Unchanged position of thoracostomy tube at the left lung base. The small pneumothorax at the lateral left lung appears larger than the comparison plain film. Similar appearance of coarsened interstitial markings throughout. IMPRESSION: Unchanged thoracostomy tube. The small left pneumothorax appears slightly larger than the comparison plain film though overall has been improving over the comparison chest x-rays dating to  02/28/2019. Multiple pulmonary nodules compatible with known septic emboli. Electronically Signed   By: Gilmer Mor D.O.   On: 03/03/2019 14:42   Dg Chest 1 View  Result Date: 02/22/2019 CLINICAL DATA:  Status  post central line placement EXAM: CHEST  1 VIEW COMPARISON:  02/22/2019,, 02/21/2019, 10/01/2014 FINDINGS: Interval intubation, tip of the endotracheal tube is about 3.8 cm superior to the carina. Esophageal tube tip is below the diaphragm but non included. Left IJ central venous catheter tip over the distal SVC. No left pneumothorax. Bilateral cavitary lesions. Progressive consolidation at the left lung base. Stable cardiomediastinal silhouette. IMPRESSION: 1. Support lines and tubes as above. Left-sided central venous catheter tip over the distal SVC. No left pneumothorax 2. Worsening consolidation at the left lung base. 3. Multiple bilateral cavitary lesions which may reflect septic emboli Electronically Signed   By: Jasmine Pang M.D.   On: 02/22/2019 19:31   Dg Abd 1 View  Result Date: 02/21/2019 CLINICAL DATA:  Back pain, progressive neuro deficit, MR clearance EXAM: ABDOMEN - 1 VIEW COMPARISON:  None. FINDINGS: Cardiac monitoring leads overlie the pelvis. No surgical hardware or metallic foreign body is identified. There is a paucity of upper abdominal bowel gas, nonspecific. Patchy opacities are present in the left lung base with likely trace left effusion. Degenerative changes in the spine and hips. IMPRESSION: 1. No surgical hardware or metallic foreign body is identified. 2. Patchy opacities in the left lung base, suspicious for pneumonia. Trace left effusion. 3. Nonspecific paucity of upper abdominal bowel gas. Electronically Signed   By: Kreg Shropshire M.D.   On: 02/21/2019 22:18   Ct Head Wo Contrast  Result Date: 02/22/2019 CLINICAL DATA:  Encephalopathy EXAM: CT HEAD WITHOUT CONTRAST TECHNIQUE: Contiguous axial images were obtained from the base of the skull through the vertex without intravenous contrast. COMPARISON:  None. FINDINGS: Brain: There is no mass, hemorrhage or extra-axial collection. The size and configuration of the ventricles and extra-axial CSF spaces are normal. The brain  parenchyma is normal, without acute or chronic infarction. Vascular: No abnormal hyperdensity of the major intracranial arteries or dural venous sinuses. No intracranial atherosclerosis. Skull: The visualized skull base, calvarium and extracranial soft tissues are normal. Sinuses/Orbits: No fluid levels or advanced mucosal thickening of the visualized paranasal sinuses. No mastoid or middle ear effusion. The orbits are normal. IMPRESSION: Normal head CT. Electronically Signed   By: Deatra Robinson M.D.   On: 02/22/2019 02:36   Ct Angio Chest Pe W Or Wo Contrast  Addendum Date: 02/28/2019   ADDENDUM REPORT: 02/28/2019 22:19 ADDENDUM: Minimal mediastinal shift. Results and addendum were called by telephone at the time of interpretation on 02/28/2019 at 10:19 pm to provider NP Craige Cotta , who verbally acknowledged these results. Electronically Signed   By: Kreg Shropshire M.D.   On: 02/28/2019 22:19   Result Date: 02/28/2019 CLINICAL DATA:  Increasing shortness of breath post transesophageal ECHO EXAM: CT ANGIOGRAPHY CHEST WITH CONTRAST TECHNIQUE: Multidetector CT imaging of the chest was performed using the standard protocol during bolus administration of intravenous contrast. Multiplanar CT image reconstructions and MIPs were obtained to evaluate the vascular anatomy. CONTRAST:  OMNIPAQUE IOHEXOL 350 MG/ML SOLN COMPARISON:  Same-day radiograph FINDINGS: Cardiovascular: Satisfactory opacification the pulmonary arteries to the segmental level. Segmental and subsegmental pulmonary artery emboli are noted throughout the left lower lobe, lingula and with the lobar, segmental and subsegmental arteries of the right upper lobe. Suspect more extensive embolic burden however given the numerous cavitary lesions  throughout the lungs compatible with septic emboli. Flattening of the intraventricular septum and mild elevation of the RV LV ratio (0.95) concerning for right heart strain. Cardiac size remains within normal limits.  Few coronary artery calcifications are present. No sizable pericardial effusion though difficult to discern given increased attenuation of the mediastinal fat. Mediastinum/Nodes: Diffusely increased attenuation of the mediastinal fat suggesting soft tissue edema. Prominent mediastinal and hilar adenopathy, likely reactive. No acute abnormality of the trachea or esophagus. Thyroid gland and thoracic inlet are unremarkable. Lungs/Pleura: There are numerous rounded and cavitary fluid-filled lesions throughout the lungs suspicious for septic pulmonary emboli. Some interspersed regions of ground-glass attenuation and tree-in-bud nodularity noted in the left lung base. There is irregular visceral pleural thickening involving the left lung with a large left hydropneumothorax with dependently layering simple attenuation fluid. Small right pleural effusion present as well with adjacent passive atelectasis. Upper Abdomen: Nodular hepatic surface contour. Moderate volume upper abdominal ascites. Musculoskeletal: Nonunited subacute to remote lateral left tenth rib fracture.No acute osseous abnormality or suspicious osseous lesion. Paucity of subcutaneous fat. Body wall edema. Review of the MIP images confirms the above findings. IMPRESSION: 1. Features of widespread septic emboli with more central pulmonary arterial filling defects seen within the left lower lobe, positive for acute PE with CT evidence of right heart strain (RV/LV Ratio = 0.95) consistent with at least submassive (intermediate risk) PE. The presence of right heart strain has been associated with an increased risk of morbidity and mortality. Please activate Code PE by paging [phone]. 2. No visible pneumomediastinum. 3. Irregular visceral pleural thickening involving the left lung with a large left hydropneumothorax, concerning for empyema. 4. Small right pleural effusion and adjacent passive atelectasis. 5. Nodular hepatic surface contour, consistent with  cirrhosis. Moderate volume upper abdominal ascites. 6. Additional features of anasarca diffusely increased attenuation of the mediastinal fat and circumferential body wall edema. 7. Aortic Atherosclerosis (ICD10-I70.0). Currently awaiting provider callback. Addendum will be submitted following contact with the ordering provider or care team. Electronically Signed: By: Kreg ShropshirePrice  DeHay M.D. On: 02/28/2019 22:11   Mr Angio Head Wo Contrast  Result Date: 02/22/2019 CLINICAL DATA:  Encephalopathy, endocarditis and osteomyelitis EXAM: MRI HEAD WITHOUT CONTRAST MRA HEAD WITHOUT CONTRAST TECHNIQUE: Multiplanar, multiecho pulse sequences of the brain and surrounding structures were obtained without intravenous contrast. Angiographic images of the head were obtained using MRA technique without contrast. COMPARISON:  None. FINDINGS: Motion artifact is present. MRI HEAD FINDINGS Brain: There is no acute infarction or intracranial hemorrhage. There is no intracranial mass, mass effect, edema, or hydrocephalus. Thin pachymeningeal thickening is present. Vascular: Major vessel flow voids at the skull base are preserved. Skull and upper cervical spine: Marrow signal is within normal limits. Sinuses/Orbits: Minor paranasal sinus mucosal thickening. The orbits are unremarkable. Other: None. MRA HEAD FINDINGS Motion artifact present. There is preserved flow related enhancement of the intracranial internal carotid arteries and proximal middle and anterior cerebral arteries. Visualized intracranial vertebral arteries basilar artery, and proximal posterior cerebral arteries are also patent. There is fetal or near fetal origin of the left posterior cerebral artery. No gross aneurysm identified. IMPRESSION: Motion degraded study without evidence of septic emboli. Nonspecific thin dural thickening, which could be secondary to lumbar puncture if recently performed. Unremarkable proximal intracranial circulation. Electronically Signed   By:  Guadlupe SpanishPraneil  Patel M.D.   On: 02/22/2019 12:17   Mr Brain Wo Contrast  Result Date: 02/22/2019 CLINICAL DATA:  Encephalopathy, endocarditis and osteomyelitis EXAM: MRI HEAD WITHOUT CONTRAST MRA  HEAD WITHOUT CONTRAST TECHNIQUE: Multiplanar, multiecho pulse sequences of the brain and surrounding structures were obtained without intravenous contrast. Angiographic images of the head were obtained using MRA technique without contrast. COMPARISON:  None. FINDINGS: Motion artifact is present. MRI HEAD FINDINGS Brain: There is no acute infarction or intracranial hemorrhage. There is no intracranial mass, mass effect, edema, or hydrocephalus. Thin pachymeningeal thickening is present. Vascular: Major vessel flow voids at the skull base are preserved. Skull and upper cervical spine: Marrow signal is within normal limits. Sinuses/Orbits: Minor paranasal sinus mucosal thickening. The orbits are unremarkable. Other: None. MRA HEAD FINDINGS Motion artifact present. There is preserved flow related enhancement of the intracranial internal carotid arteries and proximal middle and anterior cerebral arteries. Visualized intracranial vertebral arteries basilar artery, and proximal posterior cerebral arteries are also patent. There is fetal or near fetal origin of the left posterior cerebral artery. No gross aneurysm identified. IMPRESSION: Motion degraded study without evidence of septic emboli. Nonspecific thin dural thickening, which could be secondary to lumbar puncture if recently performed. Unremarkable proximal intracranial circulation. Electronically Signed   By: Guadlupe Spanish M.D.   On: 02/22/2019 12:17   Mr Thoracic Spine W Wo Contrast  Result Date: 02/21/2019 CLINICAL DATA:  Back pain with weakness. History of IV drug use. EXAM: MRI THORACIC AND LUMBAR SPINE WITHOUT AND WITH CONTRAST TECHNIQUE: Multiplanar and multiecho pulse sequences of the thoracic and lumbar spine were obtained without and with intravenous  contrast. CONTRAST:  6mL GADAVIST GADOBUTROL 1 MMOL/ML IV SOLN COMPARISON:  None. FINDINGS: MRI THORACIC SPINE FINDINGS Alignment:  Physiologic. Vertebrae: Hyperintense T2-weighted signal and contrast enhancement within the T1 vertebrae. Cord:  Normal Paraspinal and other soft tissues: There are multiple cavitary lesions within the left lung. There is a large cavitary lesion of the right lung apex. Mild contrast enhancement of the paraspinal tissues at T1. Disc levels: Small central disc protrusion at T8-9 with no spinal canal stenosis. MRI LUMBAR SPINE FINDINGS Segmentation:  Standard. Alignment:  Normal Vertebrae: There is hyperintense T2-weighted signal throughout the bone marrow of the L4 and L5 vertebral bodies, extending to the disc space. There is abnormal contrast enhancement of both vertebra. Bone marrow signal is otherwise normal throughout the lumbar spine. There is fluid within both L4-5 facet joints. Conus medullaris: Extends to the L2 level and appears normal. Paraspinal and other soft tissues: There are bilateral psoas muscle collections, measuring up to 2.6 cm on the right and 2.5 cm on the left. Disc levels: L1-2: Normal. L2-3: Intermediate disc bulge with mild facet hypertrophy. No stenosis. L3-4: Left eccentric disc bulge with mild left lateral recess narrowing. Mild left foraminal stenosis. No spinal canal stenosis. L4-5: Abnormal enhancement as above. The abnormal enhancement extends into both neural foramina. There is severe bilateral foraminal stenosis. No central spinal canal stenosis. There is mild contrast enhancement in the soft tissues adjacent to the facet joints. L5-S1: There is edema of the paraspinal soft tissues. No disc herniation or stenosis. IMPRESSION: 1. Abnormal enhancement of the L4 and L5 vertebral bodies and disc space, consistent with discitis osteomyelitis. There is severe bilateral foraminal stenosis at the L4-5 level with abnormal contrast enhancement extending into both  neural foramina. 2. Bilateral psoas muscle abscesses, measuring up to 2.6 cm on the right and 2.5 cm on the left. Paraspinal soft tissue inflammation at L4-5 with fluid in both L4-5 facet joints, which may be reactive or indicative of septic arthritis. 3. Abnormal signal and enhancement of the T1 vertebral body with  surrounding paraspinal soft tissue enhancement, possibly indicating osteomyelitis. 4. Multiple cavitary lesions within both lungs, consistent with septic emboli. Electronically Signed   By: Deatra Robinson M.D.   On: 02/21/2019 23:43   Mr Lumbar Spine W Wo Contrast (assess For Abscess, Cord Compression)  Result Date: 02/21/2019 CLINICAL DATA:  Back pain with weakness. History of IV drug use. EXAM: MRI THORACIC AND LUMBAR SPINE WITHOUT AND WITH CONTRAST TECHNIQUE: Multiplanar and multiecho pulse sequences of the thoracic and lumbar spine were obtained without and with intravenous contrast. CONTRAST:  6mL GADAVIST GADOBUTROL 1 MMOL/ML IV SOLN COMPARISON:  None. FINDINGS: MRI THORACIC SPINE FINDINGS Alignment:  Physiologic. Vertebrae: Hyperintense T2-weighted signal and contrast enhancement within the T1 vertebrae. Cord:  Normal Paraspinal and other soft tissues: There are multiple cavitary lesions within the left lung. There is a large cavitary lesion of the right lung apex. Mild contrast enhancement of the paraspinal tissues at T1. Disc levels: Small central disc protrusion at T8-9 with no spinal canal stenosis. MRI LUMBAR SPINE FINDINGS Segmentation:  Standard. Alignment:  Normal Vertebrae: There is hyperintense T2-weighted signal throughout the bone marrow of the L4 and L5 vertebral bodies, extending to the disc space. There is abnormal contrast enhancement of both vertebra. Bone marrow signal is otherwise normal throughout the lumbar spine. There is fluid within both L4-5 facet joints. Conus medullaris: Extends to the L2 level and appears normal. Paraspinal and other soft tissues: There are  bilateral psoas muscle collections, measuring up to 2.6 cm on the right and 2.5 cm on the left. Disc levels: L1-2: Normal. L2-3: Intermediate disc bulge with mild facet hypertrophy. No stenosis. L3-4: Left eccentric disc bulge with mild left lateral recess narrowing. Mild left foraminal stenosis. No spinal canal stenosis. L4-5: Abnormal enhancement as above. The abnormal enhancement extends into both neural foramina. There is severe bilateral foraminal stenosis. No central spinal canal stenosis. There is mild contrast enhancement in the soft tissues adjacent to the facet joints. L5-S1: There is edema of the paraspinal soft tissues. No disc herniation or stenosis. IMPRESSION: 1. Abnormal enhancement of the L4 and L5 vertebral bodies and disc space, consistent with discitis osteomyelitis. There is severe bilateral foraminal stenosis at the L4-5 level with abnormal contrast enhancement extending into both neural foramina. 2. Bilateral psoas muscle abscesses, measuring up to 2.6 cm on the right and 2.5 cm on the left. Paraspinal soft tissue inflammation at L4-5 with fluid in both L4-5 facet joints, which may be reactive or indicative of septic arthritis. 3. Abnormal signal and enhancement of the T1 vertebral body with surrounding paraspinal soft tissue enhancement, possibly indicating osteomyelitis. 4. Multiple cavitary lesions within both lungs, consistent with septic emboli. Electronically Signed   By: Deatra Robinson M.D.   On: 02/21/2019 23:43   US Renal  Result Date: 02/22/2019 CLINICAL DATA:  Acute renal injury EXAM: RENAL / URINARY TRACT ULTRASOUND COMPLETE COMPARISON:  None. FINDINGS: Right Kidney: Renal measurements: 11.2 x 6.2 x 5.9 cm. = volume: 213 mL. Mild hydronephrosis is noted. Mild increased echogenicity is seen. Left Kidney: Renal measurements: 13 x 5.8 x 5.7 cm = volume: 223 mL. Increased echogenicity is noted. Mild hydronephrosis is noted. Bladder: Bladder is well distended. Debris is noted  within. Correlate with any possible UTI. Other: None IMPRESSION: Mild hydronephrosis is noted. This may be related to the distended bladder. Mild debris within the bladder which may be related to an underlying UTI. Clinical correlation is recommended. Increased echogenicity is noted consistent with medical renal disease. Electronically  Signed   By: Alcide Clever M.D.   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 The abscesses are currently too small for drain placement, however we can proceed with CT-guided aspiration to provide material for culture. EXAM: CT-guided aspiration MEDICATIONS: The patient is currently admitted to the hospital and receiving intravenous antibiotics. The antibiotics were administered within an appropriate time frame prior to the initiation of the procedure. ANESTHESIA/SEDATION: Fentanyl 100 mcg IV; Versed 2 mg IV Moderate Sedation Time:  15 minutes The patient was continuously monitored during the procedure by the interventional radiology nurse under my direct supervision. COMPLICATIONS: None immediate. PROCEDURE: Informed written consent was obtained from the patient after a thorough discussion of the procedural risks, benefits and alternatives. All questions were addressed. A timeout was performed prior to the initiation of the procedure. A planning axial CT scan was performed. The fluid collections within the right psoas muscle were successfully identified. A suitable skin entry site was selected and marked. The overlying skin was sterilely prepped and draped in the standard fashion using chlorhexidine skin prep. Local anesthesia was attained by infiltration with 1% lidocaine. A small dermatotomy was made. Under intermittent CT guidance, an 18 gauge trocar needle was advanced into the fluid collection. Aspiration was then performed yielding approximately 18  mL of purulent fluid. Samples were sent for Gram stain and culture. The trocar needle was removed. Post aspiration CT imaging demonstrates no evidence of hematoma or other immediate complication. IMPRESSION: Successful CT-guided aspiration of a right psoas abscess yielding 18 mL purulent fluid which was sent for culture. Signed, Sterling Big, MD, RPVI [facility] Electronically Signed   By: Malachy Moan M.D.   On: 02/22/2019 14:57   Dg Chest Port 1 View  Result Date: 03/04/2019 CLINICAL DATA:  Pneumothorax EXAM: PORTABLE CHEST 1 VIEW COMPARISON:  Chest radiograph 03/03/2019. FINDINGS: Monitoring leads overlie the patient. Stable cardiac and mediastinal contours. Scattered nodules demonstrated throughout the lungs bilaterally. Similar heterogeneous opacities left lung base. Left chest tube remains in position. Similar small pneumothorax within the upper lateral left hemithorax. Thoracic spine degenerative changes. IMPRESSION: 1. Similar small pneumothorax within the upper lateral left hemithorax with chest tube in place. 2. Scattered nodules throughout the lungs bilaterally. 3. Similar heterogeneous opacities left lung base. Electronically Signed   By: Annia Belt M.D.   On: 03/04/2019 09:39   Dg Chest Port 1 View  Result Date: 03/03/2019 CLINICAL DATA:  Left-sided pneumothorax. EXAM: PORTABLE CHEST 1 VIEW COMPARISON:  03/02/2019.  CT FINDINGS: Left chest tube in stable position. Small left pneumothorax unchanged. Multiple bilateral cavitary pulmonary nodules consistent with septic emboli again noted. These have progressed in number and size from prior exam. Heart size normal. No acute bony abnormality. IMPRESSION: 1. Left chest tube in stable position. Stable small left pneumothorax. 2. Multiple cavitary pulmonary nodules consistent with septic emboli are again noted. These have progressed in number and size from prior exam.  Electronically Signed   By: Maisie Fus  Register   On: 03/03/2019 10:04   Dg Chest Port 1 View  Result Date: 03/02/2019 CLINICAL DATA:  Follow-up pleural effusion. EXAM: PORTABLE CHEST 1 VIEW COMPARISON:  Chest x-ray 03/01/2019 and chest CT 02/28/2019 FINDINGS: The cardiac silhouette, mediastinal and hilar contours are stable. Stable left IJ central venous catheter. The left-sided pleural drainage catheter is in stable position. Interval decrease in size of  the left-sided pneumothorax. Stable numerous cavitary lesions in both lungs consistent with septic emboli. IMPRESSION: Left chest tube in good position with interval decrease in size of the left-sided pneumothorax. Stable bilateral septic emboli and infiltrates. Electronically Signed   By: Rudie Meyer M.D.   On: 03/02/2019 08:23   Dg Chest Port 1 View  Result Date: 03/01/2019 CLINICAL DATA:  48 year old male with a history of chest tube placement EXAM: PORTABLE CHEST 1 VIEW COMPARISON:  CT chest 02/28/2019, plain film 02/28/2019 FINDINGS: Cardiomediastinal silhouette is unchanged in size and contour. Unchanged left IJ central venous catheter with the tip appearing to terminate superior cavoatrial junction. Interval placement thoracostomy tube the left lung base. Components of pneumothorax in the upper lung persist with incomplete re-expansion of the lung. Opacity within the left mid and lower lung persists obscuring the left hemidiaphragm in the left heart border. Nodular opacities of the right lung persist. No right pneumothorax or right pleural effusion. IMPRESSION: Interval placement of left-sided thoracostomy tube at the lung base with incomplete re-expansion of the left lung and persisting pneumothorax/hydropneumothorax. Similar appearance of the lungs with multifocal septic emboli and consolidation/scarring at the left lung base. Unchanged left IJ central venous catheter. Electronically Signed   By: Gilmer Mor D.O.   On: 03/01/2019 12:43   Dg  Chest Port 1 View  Addendum Date: 02/28/2019   ADDENDUM REPORT: 02/28/2019 19:27 ADDENDUM: Critical Value/emergent results were called by telephone at the time of interpretation on 02/28/2019 at 7:27 pm to providerKaren Craige Cotta, who verbally acknowledged these results. Electronically Signed   By: Donzetta Kohut M.D.   On: 02/28/2019 19:27   Result Date: 02/28/2019 CLINICAL DATA:  Elevated heart rate and dyspnea. EXAM: PORTABLE CHEST 1 VIEW COMPARISON:  02/24/2019 FINDINGS: New loculated left pneumothorax in the setting of cavitary lung lesions. Moderately large pneumothorax and areas of collapse/consolidation in the left lung base also with sub pulmonic lucency and deep sulcus sign. Concomitant left pleural effusion is suspected. Cavitary lesions in the right chest are unchanged. Mild rotation with probable slight mediastinal shift. Post extubation with left IJ central venous catheter terminating at the caval to atrial junction as before. No acute bone process. IMPRESSION: 1. New loculated left pneumothorax in the setting of cavitary lung lesions. Mediastinal shift while slight is present on the current study. 2. Stable cavitary lesions in the right chest. 3. Post extubation. 4. A call is out to the referring provider to further discuss above findings. Electronically Signed: By: Donzetta Kohut M.D. On: 02/28/2019 19:19   Dg Chest Port 1 View  Result Date: 02/24/2019 CLINICAL DATA:  Endotracheal tube EXAM: PORTABLE CHEST 1 VIEW COMPARISON:  Two days ago FINDINGS: Endotracheal tube tip is just below the clavicular heads. Left IJ line with tip at the upper cavoatrial junction. The orogastric tube tip reaches the stomach. Bilateral cavitary pneumonia with small left pleural effusion. No convincing change from prior. No visible air leak. IMPRESSION: 1. Unremarkable hardware positioning. 2. Unchanged multiple pulmonary cavities with left lower lobe pneumonia and small pleural effusion, findings of septic emboli.  Electronically Signed   By: Marnee Spring M.D.   On: 02/24/2019 05:31   Dg Chest Port 1 View  Result Date: 02/22/2019 CLINICAL DATA:  Worsening dyspnea EXAM: PORTABLE CHEST 1 VIEW COMPARISON:  February 21, 2019 FINDINGS: The cardiomediastinal silhouette is unchanged from prior exam. There is worsening patchy airspace opacity at the left lung base. There are multiple rounded pulmonary nodules within the periphery of the right lower  lung and left upper lung as on the prior exam. IMPRESSION: Worsening airspace opacity in the left lower lung which is concerning for pneumonia. Again noted are multiple bilateral rounded pulmonary nodular opacities throughout both lungs. This could be due to septic emboli. Electronically Signed   By: Jonna Clark M.D.   On: 02/22/2019 15:54   Dg Chest Port 1 View  Result Date: 02/21/2019 CLINICAL DATA:  Shortness of breath EXAM: PORTABLE CHEST 1 VIEW COMPARISON:  None. FINDINGS: There is airspace opacity in the left lower lobe with small left pleural effusion consistent with pneumonia. Within this area, there is a nodular appearing area measuring 3.1 x 2.7 cm. There is a nodular opacity in the periphery of the left mid lung measuring 1.3 x 1.1 cm. There is a nodular opacity in the periphery of the right mid lung measuring 0.9 x 0.9 cm. There is an apparent cavitary lesion in the right upper lobe measuring 2.9 x 2.1 cm. There are smaller cavitary appearing lesions throughout the lungs bilaterally. Heart size and pulmonary vascularity are normal. No adenopathy. No bone lesions. IMPRESSION: 1.  Apparent pneumonia left base with small left pleural effusion. 2. Multiple nodular lesions, some of which appear cavitated. Question metastatic foci versus septic emboli. Advise contrast enhanced chest CT to further assess. No adenopathy appreciable by radiography.  Heart size normal. Electronically Signed   By: Bretta Bang III M.D.   On: 02/21/2019 20:50   Dg Abd Portable  1v  Result Date: 02/22/2019 CLINICAL DATA:  OG tube placement EXAM: PORTABLE ABDOMEN - 1 VIEW COMPARISON:  02/21/2019 FINDINGS: Esophageal tube tip overlies the mid gastric region, side-port in the region of the cardia. Small left-sided pleural effusion with airspace disease at the left base. Cavitary lung lesions. IMPRESSION: 1. Esophageal tube tip overlies the gastric body 2. Small left pleural effusion with airspace disease at the left base. Multiple cavitary lung lesions. Electronically Signed   By: Jasmine Pang M.D.   On: 02/22/2019 19:33   Vas [place] Lower Extremity Venous (dvt)  Result Date: 03/01/2019  Lower Venous Study Indications: Pulmonary embolism.  Risk Factors: None identified. Comparison Study: No prior studies. Performing Technologist: Chanda Busing RVT  Examination Guidelines: A complete evaluation includes B-mode imaging, spectral Doppler, color Doppler, and power Doppler as needed of all accessible portions of each vessel. Bilateral testing is considered an integral part of a complete examination. Limited examinations for reoccurring indications may be performed as noted. The reflux portion of the exam is performed with the patient in reverse Trendelenburg.  +---------+---------------+---------+-----------+----------+--------------+  RIGHT     Compressibility Phasicity Spontaneity Properties Thrombus Aging  +---------+---------------+---------+-----------+----------+--------------+  CFV       Full            Yes       Yes                                    +---------+---------------+---------+-----------+----------+--------------+  SFJ       Full                                                             +---------+---------------+---------+-----------+----------+--------------+  FV Prox   Full                                                             +---------+---------------+---------+-----------+----------+--------------+  FV Mid    Full                                                              +---------+---------------+---------+-----------+----------+--------------+  FV Distal Full                                                             +---------+---------------+---------+-----------+----------+--------------+  PFV       Full                                                             +---------+---------------+---------+-----------+----------+--------------+  POP       Full            Yes       Yes                                    +---------+---------------+---------+-----------+----------+--------------+  PTV       Full                                                             +---------+---------------+---------+-----------+----------+--------------+  PERO      Full                                                             +---------+---------------+---------+-----------+----------+--------------+   +---------+---------------+---------+-----------+----------+--------------+  LEFT      Compressibility Phasicity Spontaneity Properties Thrombus Aging  +---------+---------------+---------+-----------+----------+--------------+  CFV       Full            Yes       Yes                                    +---------+---------------+---------+-----------+----------+--------------+  SFJ       Full                                                             +---------+---------------+---------+-----------+----------+--------------+  FV Prox   Full                                                             +---------+---------------+---------+-----------+----------+--------------+  FV Mid    Full                                                             +---------+---------------+---------+-----------+----------+--------------+  FV Distal Full                                                             +---------+---------------+---------+-----------+----------+--------------+  PFV       Full                                                              +---------+---------------+---------+-----------+----------+--------------+  POP       Full            Yes       Yes                                    +---------+---------------+---------+-----------+----------+--------------+  PTV       Full                                                             +---------+---------------+---------+-----------+----------+--------------+  PERO      Full                                                             +---------+---------------+---------+-----------+----------+--------------+     Summary: Right: No evidence of common femoral vein obstruction. There is no evidence of deep vein thrombosis in the lower extremity. No cystic structure found in the popliteal fossa. Left: There is no evidence of deep vein thrombosis in the lower extremity. No cystic structure found in the popliteal fossa.  *See table(s) above for measurements and observations. Electronically signed by Gretta Began MD on 03/01/2019 at 6:27:25 PM.    Final     Lab Data:  CBC: Recent Labs  Lab 02/28/19 0432  02/28/19 2023 03/01/19 0418 03/02/19 0500 03/04/19 0527 03/05/19 0233  WBC 9.4  --   --  10.3 7.9 9.0 10.5  HGB 7.1*   < > 9.4* 8.1* 7.7* 8.1* 10.8*  HCT 23.4*   < > 29.6* 26.5* 25.0* 26.1* 34.2*  MCV 92.5  --   --  91.1 90.9 92.2 91.9  PLT 182  --   --  189 170 205 116*   < > = values in this interval not displayed.   Basic Metabolic Panel: Recent Labs  Lab 02/28/19 0432 03/01/19 0418 03/02/19 0500 03/03/19 1218 03/05/19  0233  NA 138 140 138 136 136  K 4.3 3.9 3.9 3.4* 3.9  CL 107 107 105 107 105  CO2 26 26 25 23 24   GLUCOSE 104* 131* 106* 137* 158*  BUN 34* 30* 27* 25* 16  CREATININE 0.43* 0.48* 0.47* 0.44* 0.43*  CALCIUM 7.9* 7.7* 7.7* 7.4* 7.4*   GFR: Estimated Creatinine Clearance: 94.1 mL/min (A) (by C-G formula based on SCr of 0.43 mg/dL (L)). Liver Function Tests: No results for input(s): AST, ALT, ALKPHOS, BILITOT, PROT, ALBUMIN in the last 168 hours. No  results for input(s): LIPASE, AMYLASE in the last 168 hours. No results for input(s): AMMONIA in the last 168 hours. Coagulation Profile: Recent Labs  Lab 02/28/19 2331  INR 1.4*   Cardiac Enzymes: No results for input(s): CKTOTAL, CKMB, CKMBINDEX, TROPONINI in the last 168 hours. BNP (last 3 results) No results for input(s): PROBNP in the last 8760 hours. HbA1C: No results for input(s): HGBA1C in the last 72 hours. CBG: Recent Labs  Lab 03/05/19 1052 03/05/19 1624 03/05/19 2044 03/06/19 0739 03/06/19 1125  GLUCAP 170* 124* 110* 148* 176*   Lipid Profile: No results for input(s): CHOL, HDL, LDLCALC, TRIG, CHOLHDL, LDLDIRECT in the last 72 hours. Thyroid Function Tests: No results for input(s): TSH, T4TOTAL, FREET4, T3FREE, THYROIDAB in the last 72 hours. Anemia Panel: No results for input(s): VITAMINB12, FOLATE, FERRITIN, TIBC, IRON, RETICCTPCT in the last 72 hours. Urine analysis:    Component Value Date/Time   BILIRUBINUR neg 12/21/2017 1551   PROTEINUR Negative 12/21/2017 1551   UROBILINOGEN 0.2 12/21/2017 1551   NITRITE neg 12/21/2017 1551   LEUKOCYTESUR Negative 12/21/2017 1551     Leeasia Secrist M.D. [facility] 03/06/2019, 2:37 PM  Pager: [phone] Between 7am to 7pm - call Pager - [phone]  After 7pm go to [email or web address] - password [email or web address]  Call night coverage person covering after 7pm

## 2019-03-07 ENCOUNTER — Inpatient Hospital Stay (HOSPITAL_COMMUNITY): Payer: Self-pay

## 2019-03-07 ENCOUNTER — Other Ambulatory Visit: Payer: Self-pay | Admitting: *Deleted

## 2019-03-07 DIAGNOSIS — I76 Septic arterial embolism: Secondary | ICD-10-CM

## 2019-03-07 DIAGNOSIS — I2690 Septic pulmonary embolism without acute cor pulmonale: Secondary | ICD-10-CM

## 2019-03-07 DIAGNOSIS — R52 Pain, unspecified: Secondary | ICD-10-CM

## 2019-03-07 DIAGNOSIS — J851 Abscess of lung with pneumonia: Secondary | ICD-10-CM

## 2019-03-07 LAB — GLUCOSE, CAPILLARY
Glucose-Capillary: 96 mg/dL (ref 70–99)
Glucose-Capillary: 171 mg/dL — ABNORMAL HIGH (ref 70–99)
Glucose-Capillary: 158 mg/dL — ABNORMAL HIGH (ref 70–99)
Glucose-Capillary: 114 mg/dL — ABNORMAL HIGH (ref 70–99)

## 2019-03-07 LAB — CULTURE, BLOOD (ROUTINE X 2)
Culture: NO GROWTH
Culture: NO GROWTH
Special Requests: ADEQUATE
Special Requests: ADEQUATE

## 2019-03-07 IMAGING — DX DG CHEST 1V PORT
1 series · 1 of 1 positions shown · non-contrast
Comparison: Single-view of the chest [DATE] [DATE]. CT
chest [DATE]. History of septic emboli.

CLINICAL DATA: Left chest tube in place for pneumothorax.

EXAM:
PORTABLE CHEST 1 VIEW

[chest ap]
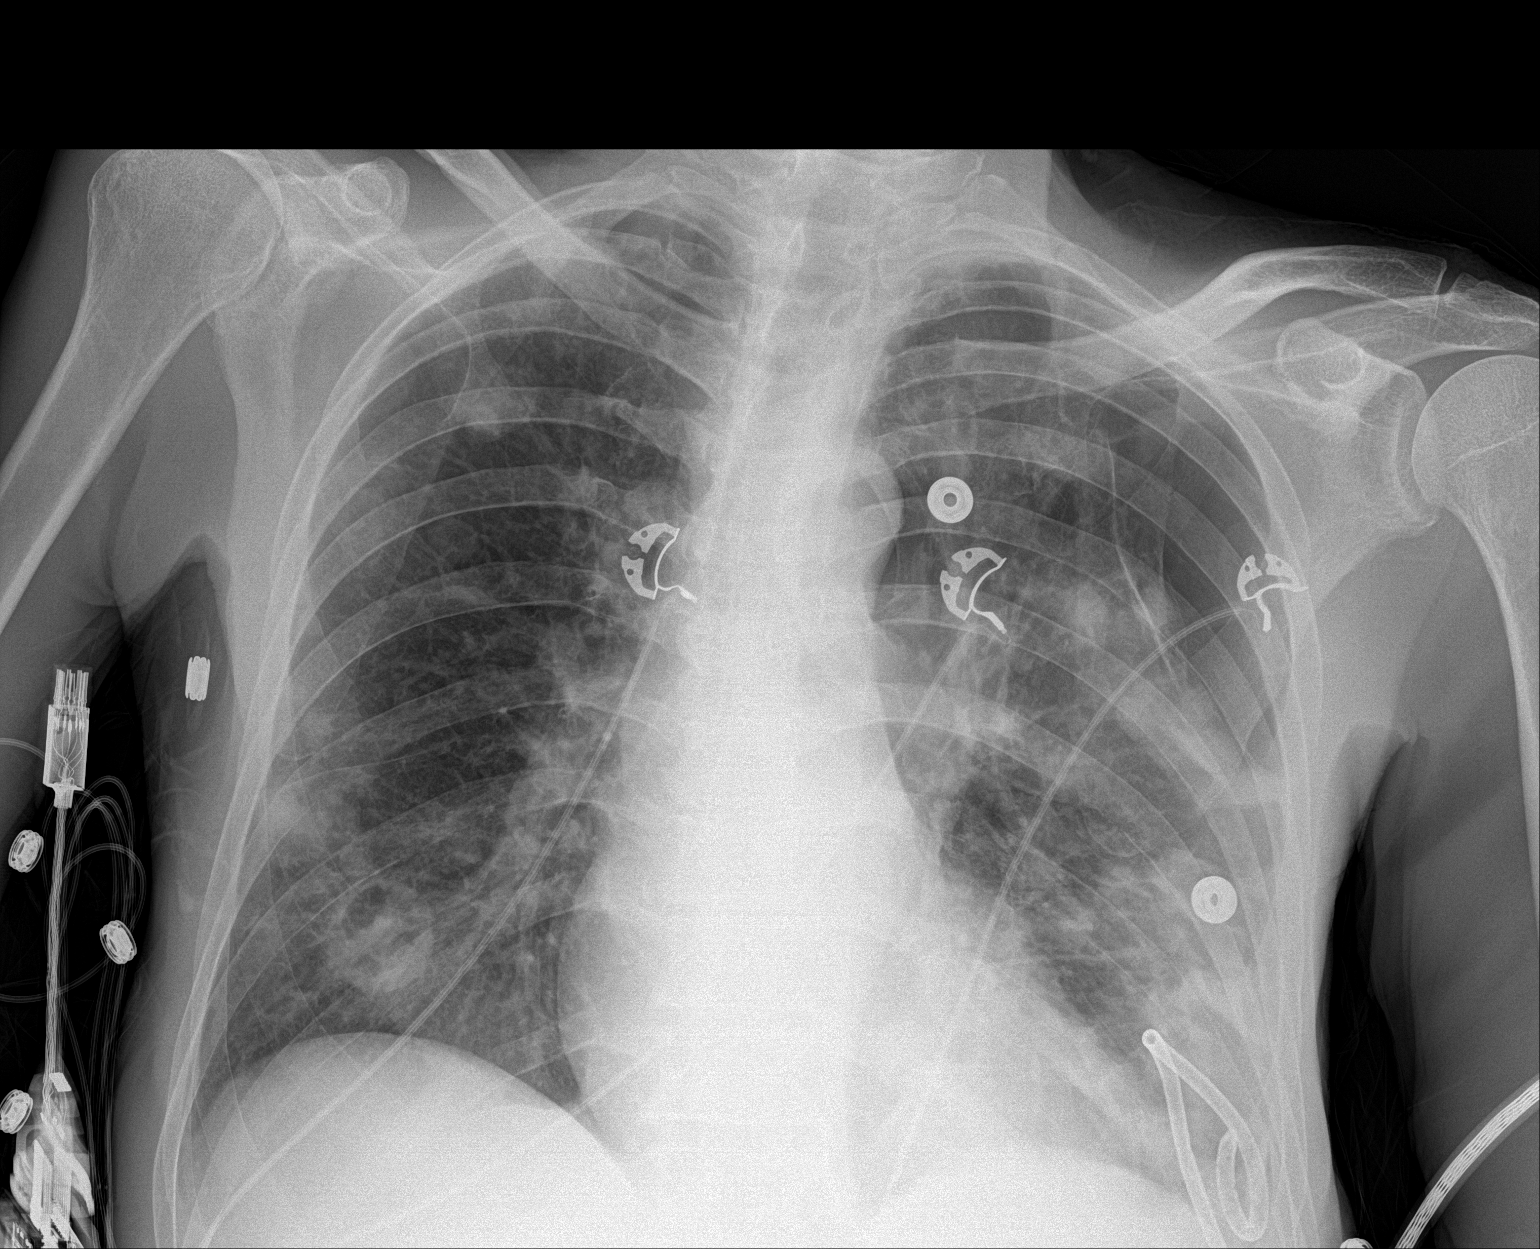

[1 of 1 positions shown; findings below may reference images not displayed]

FINDINGS: Pigtail catheter remains in place in the lower left chest. Left
pneumothorax seen on the most recent examination has slightly
increased in size. Multiple cavitary nodules are again seen
bilaterally. No right pneumothorax. Heart size is normal.
IMPRESSION: Small left pneumothorax seen on the most recent examination has
mildly increased in size. Left chest tube remains in place.

Multiple cavitary nodules bilaterally consistent with septic emboli.

## 2019-03-07 MED ORDER — SODIUM CHLORIDE 0.9 % IV SOLN
INTRAVENOUS | Status: DC | PRN
  Administered 2019-03-07: 22:00:00 250 mL via INTRAVENOUS
  Administered 2019-04-03: 1000 mL via INTRAVENOUS
  Administered 2019-04-12: 250 mL via INTRAVENOUS

## 2019-03-07 NOTE — Progress Notes (Signed)
..   NAME:  Franklin Anderson, MRN:  [id number], DOB:  1970/12/08, LOS: 26 ADMISSION DATE:  02/21/2019, CONSULTATION DATE:  02/28/2019 REFERRING MD:  Earnest Conroy MD, CHIEF COMPLAINT:  SOB   Brief History   48 yr old M with PMHx of rheumatic fever, HTN, IV heroin abuse presented on 10/27 w/ back pain and lower ext weakness. 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 Recent TEE shows mobile mass moderate in size located in the mid/ apical  RV that appears to be attached to a trabeculation. NP overnight evaluated patient at shift change noted to be tachypneic. New CXR shows left sided loculated pneumothorax. PCCM consulted    Past Medical History  .Marland Kitchen Active Ambulatory Problems    Diagnosis Date Noted  . Shortness of breath 10/01/2015  . Dyspnea 10/01/2015  . HTN (hypertension) 10/01/2015  . 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 Heroin use 10/01/2015  . Tobacco abuse 10/01/2015  . Drug abuse ([place])   . Hypertension 04/27/2010  . H/O: rheumatic fever    Resolved Ambulatory Problems    Diagnosis Date Noted  . No Resolved Ambulatory Problems   No Additional Past Medical History     Significant Hospital Events    10/27 with worsening back pain and BLE weakness.  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 I nitially evaluated by PCCM on 10/28 when pt became obtunded and dyspneic. Pt was endotracheally intubated for acute hypoxemic respiratory failure. He received a Left IJ CVC on 10/28 with follow up CXR showing no complications. He received IV Antibiotics for metastatic MSSAB infection, initially on Vancomycin and Cefepime tapered to Ancef.  He was evaluated with bedside echo and found to have pericardial effusion without tamponade. Formal TTE showed G!DD w EF 60% and possible mitral valve vegetation.  Pt received TEE on 11/3 at [facility] (w/o intubation, tolerated procedure well) and was found to have  mobile mass moderate in size located in the mid/  apical  RV that appears to be attached to a trabeculation.  11/3: NP overnight evaluated patient at shift change noted the pt to be tachypneic. Pt reported to NP that he felt more SOB than baseline not not in acute distress with no significant desaturations. A New CXR was completed: results were called to [facility] by NP.  It shows a new left sided loculated pneumothorax.  11/4: Spoke w/ Dr Prescott Gum via phone who reviewed imaging and recommended chest tube placement. Chest tube placed, marked improvement in pneumothorax 11/6: Pleural fluid volume much improved.  Ongoing 1 out of 7 airleak.  Suction from pleural tube increased to 30.  Awaiting cultures, currently has gram-positive cocci in clusters 11/9 still w/ airleak. Got symptomatic (more short of breath) when chest tube sxn was not functioning  11/10 on going BPF. Asked for formal thoracic eval. Concerned that we won't be able to get CT out.  Consults:  10/28>>Infectious Disease 10/28>>PCCM 10/29>> Cardiology 11/3>>> PCCM re-consult 11/4: Phone consult with thoracic surgery Procedures:  10/28>>CT guided aspiration of right psoas abscesses 10/28>>Endotracheally intubated 10/28>> LIJ CVC placement 10/28>>Bronchoscopy with BAL 10/31>>Extubated to 3L [employer] by RT 11/3>>> Transesophageal Echo 11/4: Left-sided chest tube   Micro Data:  Bllood cx x 2 on 10/27>>> + MSSA Blood cx  On 10/28>>>>>>+ MSSA Abscess Rt Psoas>>MODERATE STAPHYLOCOCCUS AUREUS 10/28 resp cx on 10/28>>MODERATE STAPHYLOCOCCUS AUREUS  Repeat Blood cx on 10/30>>NGTD (4 days) Pleural fluid 11/4>>>MSSA Antimicrobials:  Abx course--Cefepime10/28 >>10/29 ,Vanc 10/28 >>10/30 Cefazolin 10/30>>  Subjective   Feels better today no distress  Objective   Blood pressure (Abnormal) 132/92, pulse 86, temperature (Abnormal) 97.5 F (36.4 C), resp. rate 20, height 6' (1.829 m), weight 56.9 kg, SpO2 97 %.        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  Chest tube assessment with intermittent 1 out of 7 airleak still has purulent drainage from chest. 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 The CT appears in good position. There is remains some Left basilar airspace disease. The left Upper lobe lucency persists. I think that this may be abscess and not ptx.  -concern here is if he has BPF the chest tube may not come out.  Plan Cont abx per ID Cont CT at 30 Awaiting thoracic surg eval  If can't remove CT  disposition will be challenging.      Critical care time:NA     Erick Colace ACNP-BC [place] Pager # [phone] OR # [phone] if no answer

## 2019-03-07 NOTE — Progress Notes (Signed)
[facility]                                                                              Patient Demographics  Franklin Anderson, is a 48 y.o. male, DOB - 1970/11/22, ZOX:[id number]  Admit date - 02/21/2019   Admitting Physician Hillary Bow, DO  Outpatient Primary MD for the patient is Julieanne Manson, MD  Outpatient specialists:   LOS - 13  days   Medical records reviewed and are as summarized below:    Chief Complaint  Patient presents with   Back Pain   Heroin Detox   Weight Loss       Brief summary   47 y.o.malewith medical history significant of rheumatic fever, HTN, ongoingIV heroin abuse, presented to the ED on 10/27 with 3 week course of progressively worsening back pain and BLE weakness associated with some bowel incontinence and trouble walking. Last use of heroin was on the day of admission. ED Course:Found to have:sepsis with Tm 100, HR 126, RR 31, WBC 22.4k,AKI with BUN 111 and creat 1.7. Imaging studies revealed osteomyelitis and diskitis of L4-L5,septic pulmonary emboli and B psoas abscesses. Patient recieved 2L NS bolus, put on cefepime / vanc / flagyl initially, admitted to [facility] for Mx of sepsis and associated metabolic encephalopathy.  Imaging studies revealed osteomyelitis and discitis of L4-L5, septic pulmonary emboli, psoas abscess.  Blood cultures 10/27> MSSA.  On 10/28 was found to be obtunded and dyspneic and was intubated by critical care, extubated on 10/31.  Right psoas abscess was aspirated on 10/28, wound care > MSSA.  Respiratory culture from 10/28 also grew MSSA. 2D echo revealed grade 1 diastolic dysfunction, possible mitral valve vegetation.  MRI head also showed abnormal dural enhancement.  Additional comorbidities included hyper natremia, persistent uremia, pericardial effusion, severe deconditioning. 11/3: Patient underwent TEE and had acute respiratory distress later that evening with stat chest x-ray showing loculated  pneumothorax/CT chest indicating empyema/PE/hydropneumothorax.  Now seen by PCCM and post chest tube placement.Code status changed to DNR  Assessment & Plan    Principal Problem: Acute hypoxic respiratory failure, acute respiratory distress on 11/3 with CT findings of empyema -Extubated since 10/31.  Respiratory cultures grew MSSA. -2D echo showed possible mitral valve vegetation, underwent TEE on 11/3, noted to be in respiratory distress. -Chest x-ray showed loculated pneumothorax with mediastinal shift.  CCM was reconsulted.  CT chest showed widespread septic emboli with more central pulmonary artery filling defects seen within the left lower lobe, positive for acute PE with CT evidence of right heart strain.  No pneumo mediastinum.  Left large hydropneumothorax, concerning for empyema. -Patient underwent chest tube/drainage of empyema by CCM on 11/4, cultures >MSSA. - Cardiology recommended discontinuing heparin drip that was started for PE as likely representative of septic emboli -Pulmonology following, continue chest tube, antibiotics per ID, Dr. Algis Liming -CT surgery consulted, do not think patient would tolerate thoracotomy and muscle flap closure, recommended pigtail empyema tube for a long time. Pending CT chest  Active Problems:  MSSA bacteremia/sepsis with discitis/vertebral osteomyelitis/psoas abscess/septic pulmonary emboli/cavitary lesion, empyema, mural vegetation -In the setting of IV drug abuse.  Blood cultures,  so was cultures and respiratory cultures growing MSSA. -will plan on PICC line once cleared by ID.  Poor peripheral IV access due to drug abuse history. -Moving all 4 extremities, no focal weakness -limited echo on 11/4 had shown circumferential  moderate pericardial effusion, no evidence of tamponade, repeat echo 11/9 shows EF of 60 to 65%, small pericardial effusion, decreased in size  Psoas abscess -Status post CT-guided right psoas aspiration by IR on 10/28  yielding 18 mL of purulent fluid, cultures grew MSSA -Pain is better controlled after starting long-acting OxyContin, continue as needed Dilaudid  Pericardial effusion - limited echo on 11/4 had shown circumferential  moderate pericardial effusion, no evidence of tamponade -Repeat echo 11/9 with decreased pericardial effusion  Hypernatremia Sodium level now improved, tolerating diet.  Sodium 148 on 10/31 Sodium level stable, will repeat labs in a.m.  AKI, uremia -On admission, creatinine 1.7 with lactic acidosis 2.1 -Mental status at baseline, creatinine 0.4 on 11/8, repeat labs  Acute metabolic encephalopathy -In the setting of sepsis, widespread septic emboli,  Uremia Mental status at baseline, alert and oriented.  Normocytic anemia -Possibly anemia of chronic disease versus hemolytic in the setting of endocarditis.   H&H stable  Uncontrolled hypertension -BP stable, continue metoprolol, Norvasc.  Hydralazine IV as needed with parameters.    Pressure injury Sacrum with stage I, wound care per nursing  Severe protein calorie malnutrition, hypoalbuminemia, generalized deconditioning -Albumin level 1.4 on 10/31 will recheck in a.m.  Code Status: DNR status DVT Prophylaxis: Heparin subcu Family Communication: Discussed all imaging results, lab results, explained to the patient   Disposition Plan: Per ID, pulmonary.  Not ready for discharge, still has chest tube.  Time Spent in minutes 25 minutes  Procedures:    Consultants:   Cardiology CCM Infectious disease Palliative care  Antimicrobials:   Anti-infectives (From admission, onward)   Start     Dose/Rate Route Frequency Ordered Stop   02/24/19 2200  vancomycin (VANCOCIN) IVPB 1000 mg/200 mL premix  Status:  Discontinued     1,000 mg 200 mL/hr over 60 Minutes Intravenous Every 12 hours 02/24/19 1010 02/24/19 1253   02/24/19 1600  ceFAZolin (ANCEF) IVPB 2g/100 mL premix     2 g 200 mL/hr over 30 Minutes  Intravenous Every 8 hours 02/24/19 1253     02/23/19 2200  vancomycin (VANCOCIN) IVPB 750 mg/150 ml premix  Status:  Discontinued     750 mg 150 mL/hr over 60 Minutes Intravenous Every 24 hours 02/23/19 0853 02/23/19 0909   02/23/19 2200  vancomycin (VANCOCIN) IVPB 750 mg/150 ml premix  Status:  Discontinued     750 mg 150 mL/hr over 60 Minutes Intravenous Every 12 hours 02/23/19 0909 02/24/19 1010   02/22/19 2200  vancomycin (VANCOCIN) IVPB 750 mg/150 ml premix  Status:  Discontinued     750 mg 150 mL/hr over 60 Minutes Intravenous Every 24 hours 02/22/19 0826 02/22/19 0827   02/22/19 2200  vancomycin (VANCOCIN) IVPB 1000 mg/200 mL premix  Status:  Discontinued     1,000 mg 200 mL/hr over 60 Minutes Intravenous Every 24 hours 02/22/19 0827 02/23/19 0853   02/22/19 0900  ceFEPIme (MAXIPIME) 2 g in sodium chloride 0.9 % 100 mL IVPB  Status:  Discontinued     2 g 200 mL/hr over 30 Minutes Intravenous Every 12 hours 02/22/19 0826 02/23/19 1135   02/21/19 2015  ceFEPIme (MAXIPIME) 2 g in sodium chloride 0.9 % 100 mL IVPB     2 g 200  mL/hr over 30 Minutes Intravenous  Once 02/21/19 2013 02/22/19 0031   02/21/19 2015  metroNIDAZOLE (FLAGYL) IVPB 500 mg     500 mg 100 mL/hr over 60 Minutes Intravenous  Once 02/21/19 2013 02/22/19 0031   02/21/19 2015  vancomycin (VANCOCIN) IVPB 1000 mg/200 mL premix     1,000 mg 200 mL/hr over 60 Minutes Intravenous  Once 02/21/19 2013 02/22/19 0200         Medications  Scheduled Meds:  amLODipine  10 mg Oral Daily   Chlorhexidine Gluconate Cloth  6 each Topical Daily   feeding supplement (ENSURE ENLIVE)  237 mL Oral BID BM   feeding supplement (PRO-STAT SUGAR FREE 64)  30 mL Oral BID   insulin aspart  0-24 Units Subcutaneous TID AC & HS   mouth rinse  15 mL Mouth Rinse BID   metoprolol tartrate  50 mg Oral BID   multivitamin with minerals  1 tablet Oral Daily   oxyCODONE  10 mg Oral Q12H   pantoprazole  40 mg Oral Daily   sodium  chloride flush  10-40 mL Intracatheter Q12H   Continuous Infusions:   ceFAZolin (ANCEF) IV 2 g (03/07/19 0546)   sodium chloride     PRN Meds:.acetaminophen **OR** acetaminophen, acetaminophen, clonazePAM, hydrALAZINE, HYDROmorphone (DILAUDID) injection, ipratropium-albuterol, ondansetron **OR** ondansetron (ZOFRAN) IV, oxyCODONE-acetaminophen, senna-docusate, sodium chloride flush      Subjective:   Franklin Anderson was seen and examined today.  Pain better controlled after starting long-acting medication.  No fevers or chills.  No dizziness, shortness of breath, abdominal pain, nausea vomiting.    Objective:   Vitals:   03/07/19 0103 03/07/19 0500 03/07/19 0501 03/07/19 0929  BP: (!) 154/99  (!) 152/105 (!) 132/92  Pulse: (!) 106  (!) 115 86  Resp: Temp: 98.5 F (36.9 C)  99 F (37.2 C) (!) 97.5 F (36.4 C)  TempSrc: Oral     SpO2: 94%  95% 97%  Weight:  56.9 kg    Height:        Intake/Output Summary (Last 24 hours) at 03/07/2019 1441 Last data filed at 03/07/2019 0725 Gross per 24 hour  Intake --  Output 1180 ml  Net -1180 ml     Wt Readings from Last 3 Encounters:  03/07/19 56.9 kg  12/21/17 65.3 kg  04/22/16 68 kg   Physical Exam  General: Alert and oriented x 3, NAD  Eyes:   HEENT:  Atraumatic, normocephalic  Cardiovascular: S1 S2 clear, RRR.  Pedal edema bilaterally  Respiratory: Decreased breath sound the bases, chest tube to the left  Gastrointestinal: Soft, nontender, nondistended, NBS  Ext: Bilateral feet swollen   Neuro: no new deficits  Musculoskeletal: No cyanosis, clubbing  Skin: No rashes  Psych: Normal affect and demeanor, alert and oriented x3     Data Reviewed:  I have personally reviewed following labs and imaging studies  Micro Results Recent Results (from the past 240 hour(s))  Body fluid culture     Status: None   Collection Time: 03/01/19  1:30 PM   Specimen: Chest; Body Fluid  Result Value Ref Range  Status   Specimen Description   Final    CHEST Performed at [facility], [address], [place], [place] [address]    Special Requests   Final    Normal Performed at [facility], [address] [place], [place] [address]    Gram Stain   Final  MODERATE WBC PRESENT, PREDOMINANTLY PMN FEW GRAM POSITIVE COCCI IN CLUSTERS Performed at [facility], [address], [place], [place] [address]    Culture RARE STAPHYLOCOCCUS AUREUS  Final   Report Status 03/04/2019 FINAL  Final   Organism ID, Bacteria STAPHYLOCOCCUS AUREUS  Final      Susceptibility   Staphylococcus aureus - MIC*    CIPROFLOXACIN <=0.5 SENSITIVE Sensitive     ERYTHROMYCIN >=8 RESISTANT Resistant     GENTAMICIN <=0.5 SENSITIVE Sensitive     OXACILLIN 0.5 SENSITIVE Sensitive     TETRACYCLINE <=1 SENSITIVE Sensitive     VANCOMYCIN <=0.5 SENSITIVE Sensitive     TRIMETH/SULFA <=10 SENSITIVE Sensitive     CLINDAMYCIN <=0.25 SENSITIVE Sensitive     RIFAMPIN <=0.5 SENSITIVE Sensitive     Inducible Clindamycin NEGATIVE Sensitive     * RARE STAPHYLOCOCCUS AUREUS  Culture, blood (routine x 2)     Status: None   Collection Time: 03/02/19  7:10 PM   Specimen: BLOOD RIGHT ARM  Result Value Ref Range Status   Specimen Description   Final    BLOOD RIGHT ARM Performed at [facility], [address], [place], [place] [address]    Special Requests   Final    BOTTLES DRAWN AEROBIC AND ANAEROBIC Blood Culture adequate volume Performed at [facility], [address], [place], [place] [address]    Culture   Final    NO GROWTH 5 DAYS Performed at [facility], [address] [place], [place] [address]    Report Status 03/07/2019 FINAL  Final  Culture, blood (routine x 2)     Status: None   Collection Time: 03/02/19  7:17 PM   Specimen: BLOOD LEFT ARM  Result Value Ref Range Status   Specimen Description   Final    BLOOD LEFT ARM Performed at  [facility], [address], [place], [place] [address]    Special Requests   Final    BOTTLES DRAWN AEROBIC ONLY Blood Culture adequate volume Performed at [facility], [address], [place], [place] [address]    Culture   Final    NO GROWTH 5 DAYS Performed at [facility], [address], [place], [place] [address]    Report Status 03/07/2019 FINAL  Final    Radiology Reports Dg Skull 1-3 Views  Result Date: 02/21/2019 CLINICAL DATA:  48 year old male with MRI clearance. EXAM: SKULL - 1-3 VIEW COMPARISON:  None. FINDINGS: There is no evidence of skull fracture or other focal bone lesions. There is opacification of the right frontal sinus. Nasal and tongue piercings noted. No other radiopaque foreign object identified. IMPRESSION: 1. Nose ring and tongue piercing. No other radiopaque/metallic foreign object. 2. No evidence of skull fracture or other focal bone lesions. 3. Opacification of the right frontal sinus. . Electronically Signed   By: Elgie Collard M.D.   On: 02/21/2019 22:21   Dg Chest 1 View  Result Date: 03/03/2019 CLINICAL DATA:  48 year old male with increased shortness of breath EXAM: CHEST  1 VIEW COMPARISON:  Multiple prior comparison is most recently 03/03/2019, dating to 02/28/2019 FINDINGS: Cardiomediastinal silhouette unchanged in size and contour. Nodules throughout the lungs again noted, compatible with known septic emboli. Unchanged position of thoracostomy tube at the left lung base. The small pneumothorax at the lateral left lung appears larger than the comparison plain film. Similar appearance of coarsened interstitial markings throughout. IMPRESSION: Unchanged thoracostomy  tube. The small left pneumothorax appears slightly larger than the comparison plain film though overall has been improving over the comparison chest x-rays dating to 02/28/2019. Multiple pulmonary nodules compatible with known septic emboli. Electronically  Signed   By: Gilmer Mor D.O.   On: 03/03/2019 14:42   Dg Chest 1 View  Result Date: 02/22/2019 CLINICAL DATA:  Status post central line placement EXAM: CHEST  1 VIEW COMPARISON:  02/22/2019,, 02/21/2019, 10/01/2014 FINDINGS: Interval intubation, tip of the endotracheal tube is about 3.8 cm superior to the carina. Esophageal tube tip is below the diaphragm but non included. Left IJ central venous catheter tip over the distal SVC. No left pneumothorax. Bilateral cavitary lesions. Progressive consolidation at the left lung base. Stable cardiomediastinal silhouette. IMPRESSION: 1. Support lines and tubes as above. Left-sided central venous catheter tip over the distal SVC. No left pneumothorax 2. Worsening consolidation at the left lung base. 3. Multiple bilateral cavitary lesions which may reflect septic emboli Electronically Signed   By: Jasmine Pang M.D.   On: 02/22/2019 19:31   Dg Abd 1 View  Result Date: 02/21/2019 CLINICAL DATA:  Back pain, progressive neuro deficit, MR clearance EXAM: ABDOMEN - 1 VIEW COMPARISON:  None. FINDINGS: Cardiac monitoring leads overlie the pelvis. No surgical hardware or metallic foreign body is identified. There is a paucity of upper abdominal bowel gas, nonspecific. Patchy opacities are present in the left lung base with likely trace left effusion. Degenerative changes in the spine and hips. IMPRESSION: 1. No surgical hardware or metallic foreign body is identified. 2. Patchy opacities in the left lung base, suspicious for pneumonia. Trace left effusion. 3. Nonspecific paucity of upper abdominal bowel gas. Electronically Signed   By: Kreg Shropshire M.D.   On: 02/21/2019 22:18   Ct Head Wo Contrast  Result Date: 02/22/2019 CLINICAL DATA:  Encephalopathy EXAM: CT HEAD WITHOUT CONTRAST TECHNIQUE: Contiguous axial images were obtained from the base of the skull through the vertex without intravenous contrast. COMPARISON:  None. FINDINGS: Brain: There is no mass,  hemorrhage or extra-axial collection. The size and configuration of the ventricles and extra-axial CSF spaces are normal. The brain parenchyma is normal, without acute or chronic infarction. Vascular: No abnormal hyperdensity of the major intracranial arteries or dural venous sinuses. No intracranial atherosclerosis. Skull: The visualized skull base, calvarium and extracranial soft tissues are normal. Sinuses/Orbits: No fluid levels or advanced mucosal thickening of the visualized paranasal sinuses. No mastoid or middle ear effusion. The orbits are normal. IMPRESSION: Normal head CT. Electronically Signed   By: Deatra Robinson M.D.   On: 02/22/2019 02:36   Ct Angio Chest Pe W Or Wo Contrast  Addendum Date: 02/28/2019   ADDENDUM REPORT: 02/28/2019 22:19 ADDENDUM: Minimal mediastinal shift. Results and addendum were called by telephone at the time of interpretation on 02/28/2019 at 10:19 pm to provider NP Craige Cotta , who verbally acknowledged these results. Electronically Signed   By: Kreg Shropshire M.D.   On: 02/28/2019 22:19   Result Date: 02/28/2019 CLINICAL DATA:  Increasing shortness of breath post transesophageal ECHO EXAM: CT ANGIOGRAPHY CHEST WITH CONTRAST TECHNIQUE: Multidetector CT imaging of the chest was performed using the standard protocol during bolus administration of intravenous contrast. Multiplanar CT image reconstructions and MIPs were obtained to evaluate the vascular anatomy. CONTRAST:  OMNIPAQUE IOHEXOL 350 MG/ML SOLN COMPARISON:  Same-day radiograph FINDINGS: Cardiovascular: Satisfactory opacification the pulmonary arteries to the segmental level. Segmental and subsegmental pulmonary artery emboli are noted throughout the left lower lobe,  lingula and with the lobar, segmental and subsegmental arteries of the right upper lobe. Suspect more extensive embolic burden however given the numerous cavitary lesions throughout the lungs compatible with septic emboli. Flattening of the intraventricular  septum and mild elevation of the RV LV ratio (0.95) concerning for right heart strain. Cardiac size remains within normal limits. Few coronary artery calcifications are present. No sizable pericardial effusion though difficult to discern given increased attenuation of the mediastinal fat. Mediastinum/Nodes: Diffusely increased attenuation of the mediastinal fat suggesting soft tissue edema. Prominent mediastinal and hilar adenopathy, likely reactive. No acute abnormality of the trachea or esophagus. Thyroid gland and thoracic inlet are unremarkable. Lungs/Pleura: There are numerous rounded and cavitary fluid-filled lesions throughout the lungs suspicious for septic pulmonary emboli. Some interspersed regions of ground-glass attenuation and tree-in-bud nodularity noted in the left lung base. There is irregular visceral pleural thickening involving the left lung with a large left hydropneumothorax with dependently layering simple attenuation fluid. Small right pleural effusion present as well with adjacent passive atelectasis. Upper Abdomen: Nodular hepatic surface contour. Moderate volume upper abdominal ascites. Musculoskeletal: Nonunited subacute to remote lateral left tenth rib fracture.No acute osseous abnormality or suspicious osseous lesion. Paucity of subcutaneous fat. Body wall edema. Review of the MIP images confirms the above findings. IMPRESSION: 1. Features of widespread septic emboli with more central pulmonary arterial filling defects seen within the left lower lobe, positive for acute PE with CT evidence of right heart strain (RV/LV Ratio = 0.95) consistent with at least submassive (intermediate risk) PE. The presence of right heart strain has been associated with an increased risk of morbidity and mortality. Please activate Code PE by paging [phone]. 2. No visible pneumomediastinum. 3. Irregular visceral pleural thickening involving the left lung with a large left hydropneumothorax, concerning  for empyema. 4. Small right pleural effusion and adjacent passive atelectasis. 5. Nodular hepatic surface contour, consistent with cirrhosis. Moderate volume upper abdominal ascites. 6. Additional features of anasarca diffusely increased attenuation of the mediastinal fat and circumferential body wall edema. 7. Aortic Atherosclerosis (ICD10-I70.0). Currently awaiting provider callback. Addendum will be submitted following contact with the ordering provider or care team. Electronically Signed: By: Kreg ShropshirePrice  DeHay M.D. On: 02/28/2019 22:11   Mr Angio Head Wo Contrast  Result Date: 02/22/2019 CLINICAL DATA:  Encephalopathy, endocarditis and osteomyelitis EXAM: MRI HEAD WITHOUT CONTRAST MRA HEAD WITHOUT CONTRAST TECHNIQUE: Multiplanar, multiecho pulse sequences of the brain and surrounding structures were obtained without intravenous contrast. Angiographic images of the head were obtained using MRA technique without contrast. COMPARISON:  None. FINDINGS: Motion artifact is present. MRI HEAD FINDINGS Brain: There is no acute infarction or intracranial hemorrhage. There is no intracranial mass, mass effect, edema, or hydrocephalus. Thin pachymeningeal thickening is present. Vascular: Major vessel flow voids at the skull base are preserved. Skull and upper cervical spine: Marrow signal is within normal limits. Sinuses/Orbits: Minor paranasal sinus mucosal thickening. The orbits are unremarkable. Other: None. MRA HEAD FINDINGS Motion artifact present. There is preserved flow related enhancement of the intracranial internal carotid arteries and proximal middle and anterior cerebral arteries. Visualized intracranial vertebral arteries basilar artery, and proximal posterior cerebral arteries are also patent. There is fetal or near fetal origin of the left posterior cerebral artery. No gross aneurysm identified. IMPRESSION: Motion degraded study without evidence of septic emboli. Nonspecific thin dural thickening, which could  be secondary to lumbar puncture if recently performed. Unremarkable proximal intracranial circulation. Electronically Signed   By: Guadlupe SpanishPraneil  Patel M.D.   On:  02/22/2019 12:17   Mr Brain Wo Contrast  Result Date: 02/22/2019 CLINICAL DATA:  Encephalopathy, endocarditis and osteomyelitis EXAM: MRI HEAD WITHOUT CONTRAST MRA HEAD WITHOUT CONTRAST TECHNIQUE: Multiplanar, multiecho pulse sequences of the brain and surrounding structures were obtained without intravenous contrast. Angiographic images of the head were obtained using MRA technique without contrast. COMPARISON:  None. FINDINGS: Motion artifact is present. MRI HEAD FINDINGS Brain: There is no acute infarction or intracranial hemorrhage. There is no intracranial mass, mass effect, edema, or hydrocephalus. Thin pachymeningeal thickening is present. Vascular: Major vessel flow voids at the skull base are preserved. Skull and upper cervical spine: Marrow signal is within normal limits. Sinuses/Orbits: Minor paranasal sinus mucosal thickening. The orbits are unremarkable. Other: None. MRA HEAD FINDINGS Motion artifact present. There is preserved flow related enhancement of the intracranial internal carotid arteries and proximal middle and anterior cerebral arteries. Visualized intracranial vertebral arteries basilar artery, and proximal posterior cerebral arteries are also patent. There is fetal or near fetal origin of the left posterior cerebral artery. No gross aneurysm identified. IMPRESSION: Motion degraded study without evidence of septic emboli. Nonspecific thin dural thickening, which could be secondary to lumbar puncture if recently performed. Unremarkable proximal intracranial circulation. Electronically Signed   By: Guadlupe Spanish M.D.   On: 02/22/2019 12:17   Mr Thoracic Spine W Wo Contrast  Result Date: 02/21/2019 CLINICAL DATA:  Back pain with weakness. History of IV drug use. EXAM: MRI THORACIC AND LUMBAR SPINE WITHOUT AND WITH CONTRAST  TECHNIQUE: Multiplanar and multiecho pulse sequences of the thoracic and lumbar spine were obtained without and with intravenous contrast. CONTRAST:  6mL GADAVIST GADOBUTROL 1 MMOL/ML IV SOLN COMPARISON:  None. FINDINGS: MRI THORACIC SPINE FINDINGS Alignment:  Physiologic. Vertebrae: Hyperintense T2-weighted signal and contrast enhancement within the T1 vertebrae. Cord:  Normal Paraspinal and other soft tissues: There are multiple cavitary lesions within the left lung. There is a large cavitary lesion of the right lung apex. Mild contrast enhancement of the paraspinal tissues at T1. Disc levels: Small central disc protrusion at T8-9 with no spinal canal stenosis. MRI LUMBAR SPINE FINDINGS Segmentation:  Standard. Alignment:  Normal Vertebrae: There is hyperintense T2-weighted signal throughout the bone marrow of the L4 and L5 vertebral bodies, extending to the disc space. There is abnormal contrast enhancement of both vertebra. Bone marrow signal is otherwise normal throughout the lumbar spine. There is fluid within both L4-5 facet joints. Conus medullaris: Extends to the L2 level and appears normal. Paraspinal and other soft tissues: There are bilateral psoas muscle collections, measuring up to 2.6 cm on the right and 2.5 cm on the left. Disc levels: L1-2: Normal. L2-3: Intermediate disc bulge with mild facet hypertrophy. No stenosis. L3-4: Left eccentric disc bulge with mild left lateral recess narrowing. Mild left foraminal stenosis. No spinal canal stenosis. L4-5: Abnormal enhancement as above. The abnormal enhancement extends into both neural foramina. There is severe bilateral foraminal stenosis. No central spinal canal stenosis. There is mild contrast enhancement in the soft tissues adjacent to the facet joints. L5-S1: There is edema of the paraspinal soft tissues. No disc herniation or stenosis. IMPRESSION: 1. Abnormal enhancement of the L4 and L5 vertebral bodies and disc space, consistent with discitis  osteomyelitis. There is severe bilateral foraminal stenosis at the L4-5 level with abnormal contrast enhancement extending into both neural foramina. 2. Bilateral psoas muscle abscesses, measuring up to 2.6 cm on the right and 2.5 cm on the left. Paraspinal soft tissue inflammation at L4-5 with fluid  in both L4-5 facet joints, which may be reactive or indicative of septic arthritis. 3. Abnormal signal and enhancement of the T1 vertebral body with surrounding paraspinal soft tissue enhancement, possibly indicating osteomyelitis. 4. Multiple cavitary lesions within both lungs, consistent with septic emboli. Electronically Signed   By: Deatra Robinson M.D.   On: 02/21/2019 23:43   Mr Lumbar Spine W Wo Contrast (assess For Abscess, Cord Compression)  Result Date: 02/21/2019 CLINICAL DATA:  Back pain with weakness. History of IV drug use. EXAM: MRI THORACIC AND LUMBAR SPINE WITHOUT AND WITH CONTRAST TECHNIQUE: Multiplanar and multiecho pulse sequences of the thoracic and lumbar spine were obtained without and with intravenous contrast. CONTRAST:  6mL GADAVIST GADOBUTROL 1 MMOL/ML IV SOLN COMPARISON:  None. FINDINGS: MRI THORACIC SPINE FINDINGS Alignment:  Physiologic. Vertebrae: Hyperintense T2-weighted signal and contrast enhancement within the T1 vertebrae. Cord:  Normal Paraspinal and other soft tissues: There are multiple cavitary lesions within the left lung. There is a large cavitary lesion of the right lung apex. Mild contrast enhancement of the paraspinal tissues at T1. Disc levels: Small central disc protrusion at T8-9 with no spinal canal stenosis. MRI LUMBAR SPINE FINDINGS Segmentation:  Standard. Alignment:  Normal Vertebrae: There is hyperintense T2-weighted signal throughout the bone marrow of the L4 and L5 vertebral bodies, extending to the disc space. There is abnormal contrast enhancement of both vertebra. Bone marrow signal is otherwise normal throughout the lumbar spine. There is fluid within both  L4-5 facet joints. Conus medullaris: Extends to the L2 level and appears normal. Paraspinal and other soft tissues: There are bilateral psoas muscle collections, measuring up to 2.6 cm on the right and 2.5 cm on the left. Disc levels: L1-2: Normal. L2-3: Intermediate disc bulge with mild facet hypertrophy. No stenosis. L3-4: Left eccentric disc bulge with mild left lateral recess narrowing. Mild left foraminal stenosis. No spinal canal stenosis. L4-5: Abnormal enhancement as above. The abnormal enhancement extends into both neural foramina. There is severe bilateral foraminal stenosis. No central spinal canal stenosis. There is mild contrast enhancement in the soft tissues adjacent to the facet joints. L5-S1: There is edema of the paraspinal soft tissues. No disc herniation or stenosis. IMPRESSION: 1. Abnormal enhancement of the L4 and L5 vertebral bodies and disc space, consistent with discitis osteomyelitis. There is severe bilateral foraminal stenosis at the L4-5 level with abnormal contrast enhancement extending into both neural foramina. 2. Bilateral psoas muscle abscesses, measuring up to 2.6 cm on the right and 2.5 cm on the left. Paraspinal soft tissue inflammation at L4-5 with fluid in both L4-5 facet joints, which may be reactive or indicative of septic arthritis. 3. Abnormal signal and enhancement of the T1 vertebral body with surrounding paraspinal soft tissue enhancement, possibly indicating osteomyelitis. 4. Multiple cavitary lesions within both lungs, consistent with septic emboli. Electronically Signed   By: Deatra Robinson M.D.   On: 02/21/2019 23:43   US Renal  Result Date: 02/22/2019 CLINICAL DATA:  Acute renal injury EXAM: RENAL / URINARY TRACT ULTRASOUND COMPLETE COMPARISON:  None. FINDINGS: Right Kidney: Renal measurements: 11.2 x 6.2 x 5.9 cm. = volume: 213 mL. Mild hydronephrosis is noted. Mild increased echogenicity is seen. Left Kidney: Renal measurements: 13 x 5.8 x 5.7 cm = volume: 223  mL. Increased echogenicity is noted. Mild hydronephrosis is noted. Bladder: Bladder is well distended. Debris is noted within. Correlate with any possible UTI. Other: None IMPRESSION: Mild hydronephrosis is noted. This may be related to the distended bladder. Mild debris  within the bladder which may be related to an underlying UTI. Clinical correlation is recommended. Increased echogenicity is noted consistent with medical renal disease. Electronically Signed   By: Alcide Clever M.D.   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 The abscesses are currently too small for drain placement, however we can proceed with CT-guided aspiration to provide material for culture. EXAM: CT-guided aspiration MEDICATIONS: The patient is currently admitted to the hospital and receiving intravenous antibiotics. The antibiotics were administered within an appropriate time frame prior to the initiation of the procedure. ANESTHESIA/SEDATION: Fentanyl 100 mcg IV; Versed 2 mg IV Moderate Sedation Time:  15 minutes The patient was continuously monitored during the procedure by the interventional radiology nurse under my direct supervision. COMPLICATIONS: None immediate. PROCEDURE: Informed written consent was obtained from the patient after a thorough discussion of the procedural risks, benefits and alternatives. All questions were addressed. A timeout was performed prior to the initiation of the procedure. A planning axial CT scan was performed. The fluid collections within the right psoas muscle were successfully identified. A suitable skin entry site was selected and marked. The overlying skin was sterilely prepped and draped in the standard fashion using chlorhexidine skin prep. Local anesthesia was attained by infiltration with 1% lidocaine. A small dermatotomy was made. Under intermittent CT guidance, an  18 gauge trocar needle was advanced into the fluid collection. Aspiration was then performed yielding approximately 18 mL of purulent fluid. Samples were sent for Gram stain and culture. The trocar needle was removed. Post aspiration CT imaging demonstrates no evidence of hematoma or other immediate complication. IMPRESSION: Successful CT-guided aspiration of a right psoas abscess yielding 18 mL purulent fluid which was sent for culture. Signed, Sterling Big, MD, RPVI [facility] Rad[facility] Electronically Signed   By: Malachy Moan M.D.   On: 02/22/2019 14:57   Dg Chest Port 1 View  Result Date: 03/07/2019 CLINICAL DATA:  Left chest tube in place for pneumothorax. EXAM: PORTABLE CHEST 1 VIEW COMPARISON:  Single-view of the chest 03/04/2019 03/03/2019. CT chest 02/28/2019. History of septic emboli. FINDINGS: Pigtail catheter remains in place in the lower left chest. Left pneumothorax seen on the most recent examination has slightly increased in size. Multiple cavitary nodules are again seen bilaterally. No right pneumothorax. Heart size is normal. IMPRESSION: Small left pneumothorax seen on the most recent examination has mildly increased in size. Left chest tube remains in place. Multiple cavitary nodules bilaterally consistent with septic emboli. Electronically Signed   By: Drusilla Kanner M.D.   On: 03/07/2019 10:33   Dg Chest Port 1 View  Result Date: 03/04/2019 CLINICAL DATA:  Pneumothorax EXAM: PORTABLE CHEST 1 VIEW COMPARISON:  Chest radiograph 03/03/2019. FINDINGS: Monitoring leads overlie the patient. Stable cardiac and mediastinal contours. Scattered nodules demonstrated throughout the lungs bilaterally. Similar heterogeneous opacities left lung base. Left chest tube remains in position. Similar small pneumothorax within the upper lateral left hemithorax. Thoracic spine degenerative changes. IMPRESSION: 1. Similar small pneumothorax within  the upper lateral left hemithorax with chest tube in place. 2. Scattered nodules throughout the lungs bilaterally. 3. Similar heterogeneous opacities left lung base. Electronically Signed   By: Annia Belt M.D.   On: 03/04/2019 09:39   Dg Chest Port 1 View  Result Date: 03/03/2019 CLINICAL DATA:  Left-sided pneumothorax. EXAM: PORTABLE CHEST 1 VIEW COMPARISON:  03/02/2019.  CT  FINDINGS: Left chest tube in stable position. Small left pneumothorax unchanged. Multiple bilateral cavitary pulmonary nodules consistent with septic emboli again noted. These have progressed in number and size from prior exam. Heart size normal. No acute bony abnormality. IMPRESSION: 1. Left chest tube in stable position. Stable small left pneumothorax. 2. Multiple cavitary pulmonary nodules consistent with septic emboli are again noted. These have progressed in number and size from prior exam. Electronically Signed   By: Maisie Fus  Register   On: 03/03/2019 10:04   Dg Chest Port 1 View  Result Date: 03/02/2019 CLINICAL DATA:  Follow-up pleural effusion. EXAM: PORTABLE CHEST 1 VIEW COMPARISON:  Chest x-ray 03/01/2019 and chest CT 02/28/2019 FINDINGS: The cardiac silhouette, mediastinal and hilar contours are stable. Stable left IJ central venous catheter. The left-sided pleural drainage catheter is in stable position. Interval decrease in size of the left-sided pneumothorax. Stable numerous cavitary lesions in both lungs consistent with septic emboli. IMPRESSION: Left chest tube in good position with interval decrease in size of the left-sided pneumothorax. Stable bilateral septic emboli and infiltrates. Electronically Signed   By: Rudie Meyer M.D.   On: 03/02/2019 08:23   Dg Chest Port 1 View  Result Date: 03/01/2019 CLINICAL DATA:  48 year old male with a history of chest tube placement EXAM: PORTABLE CHEST 1 VIEW COMPARISON:  CT chest 02/28/2019, plain film 02/28/2019 FINDINGS: Cardiomediastinal silhouette is unchanged in size  and contour. Unchanged left IJ central venous catheter with the tip appearing to terminate superior cavoatrial junction. Interval placement thoracostomy tube the left lung base. Components of pneumothorax in the upper lung persist with incomplete re-expansion of the lung. Opacity within the left mid and lower lung persists obscuring the left hemidiaphragm in the left heart border. Nodular opacities of the right lung persist. No right pneumothorax or right pleural effusion. IMPRESSION: Interval placement of left-sided thoracostomy tube at the lung base with incomplete re-expansion of the left lung and persisting pneumothorax/hydropneumothorax. Similar appearance of the lungs with multifocal septic emboli and consolidation/scarring at the left lung base. Unchanged left IJ central venous catheter. Electronically Signed   By: Gilmer Mor D.O.   On: 03/01/2019 12:43   Dg Chest Port 1 View  Addendum Date: 02/28/2019   ADDENDUM REPORT: 02/28/2019 19:27 ADDENDUM: Critical Value/emergent results were called by telephone at the time of interpretation on 02/28/2019 at 7:27 pm to providerKaren Craige Cotta, who verbally acknowledged these results. Electronically Signed   By: Donzetta Kohut M.D.   On: 02/28/2019 19:27   Result Date: 02/28/2019 CLINICAL DATA:  Elevated heart rate and dyspnea. EXAM: PORTABLE CHEST 1 VIEW COMPARISON:  02/24/2019 FINDINGS: New loculated left pneumothorax in the setting of cavitary lung lesions. Moderately large pneumothorax and areas of collapse/consolidation in the left lung base also with sub pulmonic lucency and deep sulcus sign. Concomitant left pleural effusion is suspected. Cavitary lesions in the right chest are unchanged. Mild rotation with probable slight mediastinal shift. Post extubation with left IJ central venous catheter terminating at the caval to atrial junction as before. No acute bone process. IMPRESSION: 1. New loculated left pneumothorax in the setting of cavitary lung lesions.  Mediastinal shift while slight is present on the current study. 2. Stable cavitary lesions in the right chest. 3. Post extubation. 4. A call is out to the referring provider to further discuss above findings. Electronically Signed: By: Donzetta Kohut M.D. On: 02/28/2019 19:19   Dg Chest Port 1 View  Result Date: 02/24/2019 CLINICAL DATA:  Endotracheal tube EXAM: PORTABLE CHEST  1 VIEW COMPARISON:  Two days ago FINDINGS: Endotracheal tube tip is just below the clavicular heads. Left IJ line with tip at the upper cavoatrial junction. The orogastric tube tip reaches the stomach. Bilateral cavitary pneumonia with small left pleural effusion. No convincing change from prior. No visible air leak. IMPRESSION: 1. Unremarkable hardware positioning. 2. Unchanged multiple pulmonary cavities with left lower lobe pneumonia and small pleural effusion, findings of septic emboli. Electronically Signed   By: Marnee Spring M.D.   On: 02/24/2019 05:31   Dg Chest Port 1 View  Result Date: 02/22/2019 CLINICAL DATA:  Worsening dyspnea EXAM: PORTABLE CHEST 1 VIEW COMPARISON:  February 21, 2019 FINDINGS: The cardiomediastinal silhouette is unchanged from prior exam. There is worsening patchy airspace opacity at the left lung base. There are multiple rounded pulmonary nodules within the periphery of the right lower lung and left upper lung as on the prior exam. IMPRESSION: Worsening airspace opacity in the left lower lung which is concerning for pneumonia. Again noted are multiple bilateral rounded pulmonary nodular opacities throughout both lungs. This could be due to septic emboli. Electronically Signed   By: Jonna Clark M.D.   On: 02/22/2019 15:54   Dg Chest Port 1 View  Result Date: 02/21/2019 CLINICAL DATA:  Shortness of breath EXAM: PORTABLE CHEST 1 VIEW COMPARISON:  None. FINDINGS: There is airspace opacity in the left lower lobe with small left pleural effusion consistent with pneumonia. Within this area, there is a  nodular appearing area measuring 3.1 x 2.7 cm. There is a nodular opacity in the periphery of the left mid lung measuring 1.3 x 1.1 cm. There is a nodular opacity in the periphery of the right mid lung measuring 0.9 x 0.9 cm. There is an apparent cavitary lesion in the right upper lobe measuring 2.9 x 2.1 cm. There are smaller cavitary appearing lesions throughout the lungs bilaterally. Heart size and pulmonary vascularity are normal. No adenopathy. No bone lesions. IMPRESSION: 1.  Apparent pneumonia left base with small left pleural effusion. 2. Multiple nodular lesions, some of which appear cavitated. Question metastatic foci versus septic emboli. Advise contrast enhanced chest CT to further assess. No adenopathy appreciable by radiography.  Heart size normal. Electronically Signed   By: Bretta Bang III M.D.   On: 02/21/2019 20:50   Dg Abd Portable 1v  Result Date: 02/22/2019 CLINICAL DATA:  OG tube placement EXAM: PORTABLE ABDOMEN - 1 VIEW COMPARISON:  02/21/2019 FINDINGS: Esophageal tube tip overlies the mid gastric region, side-port in the region of the cardia. Small left-sided pleural effusion with airspace disease at the left base. Cavitary lung lesions. IMPRESSION: 1. Esophageal tube tip overlies the gastric body 2. Small left pleural effusion with airspace disease at the left base. Multiple cavitary lung lesions. Electronically Signed   By: Jasmine Pang M.D.   On: 02/22/2019 19:33   Vas [place] Lower Extremity Venous (dvt)  Result Date: 03/01/2019  Lower Venous Study Indications: Pulmonary embolism.  Risk Factors: None identified. Comparison Study: No prior studies. Performing Technologist: Chanda Busing RVT  Examination Guidelines: A complete evaluation includes B-mode imaging, spectral Doppler, color Doppler, and power Doppler as needed of all accessible portions of each vessel. Bilateral testing is considered an integral part of a complete examination. Limited examinations for reoccurring  indications may be performed as noted. The reflux portion of the exam is performed with the patient in reverse Trendelenburg.  +---------+---------------+---------+-----------+----------+--------------+  RIGHT     Compressibility Phasicity Spontaneity Properties Thrombus Aging  +---------+---------------+---------+-----------+----------+--------------+  CFV       Full            Yes       Yes                                    +---------+---------------+---------+-----------+----------+--------------+  SFJ       Full                                                             +---------+---------------+---------+-----------+----------+--------------+  FV Prox   Full                                                             +---------+---------------+---------+-----------+----------+--------------+  FV Mid    Full                                                             +---------+---------------+---------+-----------+----------+--------------+  FV Distal Full                                                             +---------+---------------+---------+-----------+----------+--------------+  PFV       Full                                                             +---------+---------------+---------+-----------+----------+--------------+  POP       Full            Yes       Yes                                    +---------+---------------+---------+-----------+----------+--------------+  PTV       Full                                                             +---------+---------------+---------+-----------+----------+--------------+  PERO      Full                                                             +---------+---------------+---------+-----------+----------+--------------+   +---------+---------------+---------+-----------+----------+--------------+  LEFT      Compressibility Phasicity Spontaneity Properties Thrombus Aging   +---------+---------------+---------+-----------+----------+--------------+  CFV       Full            Yes       Yes                                    +---------+---------------+---------+-----------+----------+--------------+  SFJ       Full                                                             +---------+---------------+---------+-----------+----------+--------------+  FV Prox   Full                                                             +---------+---------------+---------+-----------+----------+--------------+  FV Mid    Full                                                             +---------+---------------+---------+-----------+----------+--------------+  FV Distal Full                                                             +---------+---------------+---------+-----------+----------+--------------+  PFV       Full                                                             +---------+---------------+---------+-----------+----------+--------------+  POP       Full            Yes       Yes                                    +---------+---------------+---------+-----------+----------+--------------+  PTV       Full                                                             +---------+---------------+---------+-----------+----------+--------------+  PERO      Full                                                             +---------+---------------+---------+-----------+----------+--------------+  Summary: Right: No evidence of common femoral vein obstruction. There is no evidence of deep vein thrombosis in the lower extremity. No cystic structure found in the popliteal fossa. Left: There is no evidence of deep vein thrombosis in the lower extremity. No cystic structure found in the popliteal fossa.  *See table(s) above for measurements and observations. Electronically signed by Curt Jews MD on 03/01/2019 at 6:27:25 PM.    Final    Vas [place] Upper Extremity Venous Duplex  Result Date:  03/07/2019 UPPER VENOUS STUDY  Indications: Pain Comparison Study: no prior Performing Technologist: Abram Sander RVS  Examination Guidelines: A complete evaluation includes B-mode imaging, spectral Doppler, color Doppler, and power Doppler as needed of all accessible portions of each vessel. Bilateral testing is considered an integral part of a complete examination. Limited examinations for reoccurring indications may be performed as noted.  Right Findings: +----------+------------+---------+-----------+----------+-------+  RIGHT      Compressible Phasicity Spontaneous Properties Summary  +----------+------------+---------+-----------+----------+-------+  Subclavian                 Yes        Yes                         +----------+------------+---------+-----------+----------+-------+  Left Findings: +----------+------------+---------+-----------+----------+-------+  LEFT       Compressible Phasicity Spontaneous Properties Summary  +----------+------------+---------+-----------+----------+-------+  IJV            Full        Yes        Yes                         +----------+------------+---------+-----------+----------+-------+  Subclavian     Full        Yes        Yes                         +----------+------------+---------+-----------+----------+-------+  Axillary       Full        Yes        Yes                         +----------+------------+---------+-----------+----------+-------+  Brachial       Full        Yes        Yes                         +----------+------------+---------+-----------+----------+-------+  Radial         Full                                               +----------+------------+---------+-----------+----------+-------+  Ulnar          Full                                               +----------+------------+---------+-----------+----------+-------+  Cephalic       Full                                                +----------+------------+---------+-----------+----------+-------+  Basilic        Full                                               +----------+------------+---------+-----------+----------+-------+  Summary:  Right: No evidence of thrombosis in the subclavian.  Left: No evidence of deep vein thrombosis in the upper extremity. No evidence of superficial vein thrombosis in the upper extremity. No evidence of thrombosis in the subclavian.  *See table(s) above for measurements and observations.    Preliminary     Lab Data:  CBC: Recent Labs  Lab 02/28/19 2023 03/01/19 0418 03/02/19 0500 03/04/19 0527 03/05/19 0233  WBC  --  10.3 7.9 9.0 10.5  HGB 9.4* 8.1* 7.7* 8.1* 10.8*  HCT 29.6* 26.5* 25.0* 26.1* 34.2*  MCV  --  91.1 90.9 92.2 91.9  PLT  --  189 170 205 116*   Basic Metabolic Panel: Recent Labs  Lab 03/01/19 0418 03/02/19 0500 03/03/19 1218 03/05/19 0233  NA 140 138 136 136  K 3.9 3.9 3.4* 3.9  CL 107 105 107 105  CO2 GLUCOSE 131* 106* 137* 158*  BUN 30* 27* 25* 16  CREATININE 0.48* 0.47* 0.44* 0.43*  CALCIUM 7.7* 7.7* 7.4* 7.4*   GFR: Estimated Creatinine Clearance: 90.9 mL/min (A) (by C-G formula based on SCr of 0.43 mg/dL (L)). Liver Function Tests: No results for input(s): AST, ALT, ALKPHOS, BILITOT, PROT, ALBUMIN in the last 168 hours. No results for input(s): LIPASE, AMYLASE in the last 168 hours. No results for input(s): AMMONIA in the last 168 hours. Coagulation Profile: Recent Labs  Lab 02/28/19 2331  INR 1.4*   Cardiac Enzymes: No results for input(s): CKTOTAL, CKMB, CKMBINDEX, TROPONINI in the last 168 hours. BNP (last 3 results) No results for input(s): PROBNP in the last 8760 hours. HbA1C: No results for input(s): HGBA1C in the last 72 hours. CBG: Recent Labs  Lab 03/06/19 1125 03/06/19 1648 03/06/19 2110 03/07/19 0744 03/07/19 1138  GLUCAP 176* 152* 114* 96 171*   Lipid Profile: No results for input(s): CHOL, HDL, LDLCALC,  TRIG, CHOLHDL, LDLDIRECT in the last 72 hours. Thyroid Function Tests: No results for input(s): TSH, T4TOTAL, FREET4, T3FREE, THYROIDAB in the last 72 hours. Anemia Panel: No results for input(s): VITAMINB12, FOLATE, FERRITIN, TIBC, IRON, RETICCTPCT in the last 72 hours. Urine analysis:    Component Value Date/Time   BILIRUBINUR neg 12/21/2017 1551   PROTEINUR Negative 12/21/2017 1551   UROBILINOGEN 0.2 12/21/2017 1551   NITRITE neg 12/21/2017 1551   LEUKOCYTESUR Negative 12/21/2017 1551     Yamin Swingler M.D. [facility] 03/07/2019, 2:41 PM  Pager: [phone] Between 7am to 7pm - call Pager - [phone]  After 7pm go to [email or web address] - password [email or web address]  Call night coverage person covering after 7pm

## 2019-03-07 NOTE — Progress Notes (Signed)
Patient was ordered a CT scan. NP paged related to chest tube, if suction could be water sealed. Hospitalist notified.No Ct scan would be done today.

## 2019-03-07 NOTE — Progress Notes (Signed)
Upper extremity venous has been completed.   Preliminary results in CV Proc.   Abram Sander 03/07/2019 9:16 AM

## 2019-03-07 NOTE — Progress Notes (Signed)
     [address]       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  He also has multiple loculated fluid collections on the left.  A pig tail catheter was placed in one of the fluid collections, which in retrospect was likely a pulmonary abscess.  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  Given the nature of this process, the main treatment would be a thoracotomy, and muscle flap closure.  I do not think that he would tolerate this procedure.  The other option would be to treat the pigtail like an empyema tube, which would need to remain in place for long term.  Franklin Anderson Bary Leriche

## 2019-03-07 NOTE — Progress Notes (Signed)
OT Cancellation Note  Patient Details Name: Franklin Anderson MRN: [id number] DOB: May 03, 1970   Cancelled Treatment:    Reason Eval/Treat Not Completed: Patient declined stating he didn't sleep well.  OT provided encouragement as well as spoke with RN. Will check on pt next day  Kari Baars, [place] [phone] Office- [phone], Thereasa Parkin 03/07/2019, 12:11 PM

## 2019-03-07 NOTE — Progress Notes (Signed)
Subjective:  Continues to feel better and neck pain better  Antibiotics:  Anti-infectives (From admission, onward)   Start     Dose/Rate Route Frequency Ordered Stop   02/24/19 2200  vancomycin (VANCOCIN) IVPB 1000 mg/200 mL premix  Status:  Discontinued     1,000 mg 200 mL/hr over 60 Minutes Intravenous Every 12 hours 02/24/19 1010 02/24/19 1253   02/24/19 1600  ceFAZolin (ANCEF) IVPB 2g/100 mL premix     2 g 200 mL/hr over 30 Minutes Intravenous Every 8 hours 02/24/19 1253     02/23/19 2200  vancomycin (VANCOCIN) IVPB 750 mg/150 ml premix  Status:  Discontinued     750 mg 150 mL/hr over 60 Minutes Intravenous Every 24 hours 02/23/19 0853 02/23/19 0909   02/23/19 2200  vancomycin (VANCOCIN) IVPB 750 mg/150 ml premix  Status:  Discontinued     750 mg 150 mL/hr over 60 Minutes Intravenous Every 12 hours 02/23/19 0909 02/24/19 1010   02/22/19 2200  vancomycin (VANCOCIN) IVPB 750 mg/150 ml premix  Status:  Discontinued     750 mg 150 mL/hr over 60 Minutes Intravenous Every 24 hours 02/22/19 0826 02/22/19 0827   02/22/19 2200  vancomycin (VANCOCIN) IVPB 1000 mg/200 mL premix  Status:  Discontinued     1,000 mg 200 mL/hr over 60 Minutes Intravenous Every 24 hours 02/22/19 0827 02/23/19 0853   02/22/19 0900  ceFEPIme (MAXIPIME) 2 g in sodium chloride 0.9 % 100 mL IVPB  Status:  Discontinued     2 g 200 mL/hr over 30 Minutes Intravenous Every 12 hours 02/22/19 0826 02/23/19 1135   02/21/19 2015  ceFEPIme (MAXIPIME) 2 g in sodium chloride 0.9 % 100 mL IVPB     2 g 200 mL/hr over 30 Minutes Intravenous  Once 02/21/19 2013 02/22/19 0031   02/21/19 2015  metroNIDAZOLE (FLAGYL) IVPB 500 mg     500 mg 100 mL/hr over 60 Minutes Intravenous  Once 02/21/19 2013 02/22/19 0031   02/21/19 2015  vancomycin (VANCOCIN) IVPB 1000 mg/200 mL premix     1,000 mg 200 mL/hr over 60 Minutes Intravenous  Once 02/21/19 2013 02/22/19 0200      Medications: Scheduled Meds:  amLODipine  10 mg  Oral Daily   Chlorhexidine Gluconate Cloth  6 each Topical Daily   feeding supplement (ENSURE ENLIVE)  237 mL Oral BID BM   feeding supplement (PRO-STAT SUGAR FREE 64)  30 mL Oral BID   insulin aspart  0-24 Units Subcutaneous TID AC & HS   mouth rinse  15 mL Mouth Rinse BID   metoprolol tartrate  50 mg Oral BID   multivitamin with minerals  1 tablet Oral Daily   oxyCODONE  10 mg Oral Q12H   pantoprazole  40 mg Oral Daily   sodium chloride flush  10-40 mL Intracatheter Q12H   Continuous Infusions:   ceFAZolin (ANCEF) IV 2 g (03/07/19 0546)   sodium chloride     PRN Meds:.acetaminophen **OR** acetaminophen, acetaminophen, clonazePAM, hydrALAZINE, HYDROmorphone (DILAUDID) injection, ipratropium-albuterol, ondansetron **OR** ondansetron (ZOFRAN) IV, oxyCODONE-acetaminophen, senna-docusate, sodium chloride flush    Objective: Weight change: -2 kg  Intake/Output Summary (Last 24 hours) at 03/07/2019 1022 Last data filed at 03/07/2019 0725 Gross per 24 hour  Intake 827 ml  Output 1930 ml  Net -1103 ml   Blood pressure (!) 132/92, pulse 86, temperature (!) 97.5 F (36.4 C), resp. rate 20, height 6' (1.829 m), weight 56.9 kg, SpO2 97 %. Temp:  [  97.5 F (36.4 C)-99.3 F (37.4 C)] 97.5 F (36.4 C) (11/10 0929) Pulse Rate:  [86-129] 86 (11/10 0929) Resp:  [16-20] 20 (11/10 0929) BP: (132-168)/(79-127) 132/92 (11/10 0929) SpO2:  [94 %-97 %] 97 % (11/10 0929) Weight:  [56.9 kg] 56.9 kg (11/10 0500)  Physical Exam: General: Alert and awake, oriented x3,cachectic, facial wasting, sitting up and eating breakfast HEENT: anicteric sclera, EOMI CVStach rate, normal  No mgr Chest: , coarse breath sounds, CT in place with bloody , purulent material Abdomen: soft non-distended,  Extremities: 3+ edema in LE Skin: no rashes, + tatooos Neuro: nonfocal  CBC:    BMET Recent Labs    03/05/19 0233  NA 136  K 3.9  CL 105  CO2 24  GLUCOSE 158*  BUN 16  CREATININE 0.43*    CALCIUM 7.4*     Liver Panel  No results for input(s): PROT, ALBUMIN, AST, ALT, ALKPHOS, BILITOT, BILIDIR, IBILI in the last 72 hours.     Sedimentation Rate No results for input(s): ESRSEDRATE in the last 72 hours. C-Reactive Protein No results for input(s): CRP in the last 72 hours.  Micro Results: Recent Results (from the past 720 hour(s))  Blood Culture (routine x 2)     Status: Abnormal   Collection Time: 02/21/19  8:13 PM   Specimen: BLOOD LEFT FOREARM  Result Value Ref Range Status   Specimen Description   Final    BLOOD LEFT FOREARM Performed at [facility], [address], [place], [place] [address]    Special Requests   Final    BOTTLES DRAWN AEROBIC AND ANAEROBIC Blood Culture results may not be optimal due to an inadequate volume of blood received in culture bottles Performed at [facility] [address], [place], [place] [address]    Culture  Setup Time   Final    GRAM POSITIVE COCCI IN BOTH AEROBIC AND ANAEROBIC BOTTLES CRITICAL RESULT CALLED TO, READ BACK BY AND VERIFIED WITH: PHARMD J LEGGE [id number] AT 1337 BY CM CRITICAL RESULT CALLED TO, READ BACK BY AND VERIFIED WITH: PHARMD E JACKSON [id number] AT 1357 BY CM Performed at [facility], [place] [address] [place], [place] [address]    Culture STAPHYLOCOCCUS AUREUS (A)  Final   Report Status 02/24/2019 FINAL  Final   Organism ID, Bacteria STAPHYLOCOCCUS AUREUS  Final      Susceptibility   Staphylococcus aureus - MIC*    CIPROFLOXACIN <=0.5 SENSITIVE Sensitive     ERYTHROMYCIN >=8 RESISTANT Resistant     GENTAMICIN <=0.5 SENSITIVE Sensitive     OXACILLIN 0.5 SENSITIVE Sensitive     TETRACYCLINE <=1 SENSITIVE Sensitive     VANCOMYCIN 1 SENSITIVE Sensitive     TRIMETH/SULFA <=10 SENSITIVE Sensitive     CLINDAMYCIN <=0.25 SENSITIVE Sensitive     RIFAMPIN <=0.5 SENSITIVE Sensitive     Inducible Clindamycin NEGATIVE Sensitive     * STAPHYLOCOCCUS AUREUS  SARS Coronavirus 2 by  RT PCR (hospital order, performed in [facility] hospital lab) Nasopharyngeal Nasopharyngeal Swab     Status: None   Collection Time: 02/21/19  8:16 PM   Specimen: Nasopharyngeal Swab  Result Value Ref Range Status   SARS Coronavirus 2 NEGATIVE NEGATIVE Final    Comment: (NOTE) If result is NEGATIVE SARS-CoV-2 target nucleic acids are NOT DETECTED. The SARS-CoV-2 RNA is generally detectable in upper and lower  respiratory specimens during the acute phase of infection. The lowest  concentration of SARS-CoV-2 viral copies this  assay can detect is 250  copies / mL. A negative result does not preclude SARS-CoV-2 infection  and should not be used as the sole basis for treatment or other  patient management decisions.  A negative result may occur with  improper specimen collection / handling, submission of specimen other  than nasopharyngeal swab, presence of viral mutation(s) within the  areas targeted by this assay, and inadequate number of viral copies  (<250 copies / mL). A negative result must be combined with clinical  observations, patient history, and epidemiological information. If result is POSITIVE SARS-CoV-2 target nucleic acids are DETECTED. The SARS-CoV-2 RNA is generally detectable in upper and lower  respiratory specimens dur ing the acute phase of infection.  Positive  results are indicative of active infection with SARS-CoV-2.  Clinical  correlation with patient history and other diagnostic information is  necessary to determine patient infection status.  Positive results do  not rule out bacterial infection or co-infection with other viruses. If result is PRESUMPTIVE POSTIVE SARS-CoV-2 nucleic acids MAY BE PRESENT.   A presumptive positive result was obtained on the submitted specimen  and confirmed on repeat testing.  While 2019 novel coronavirus  (SARS-CoV-2) nucleic acids may be present in the submitted sample  additional confirmatory testing may be necessary for  epidemiological  and / or clinical management purposes  to differentiate between  SARS-CoV-2 and other Sarbecovirus currently known to infect humans.  If clinically indicated additional testing with an alternate test  methodology [phone]) is advised. The SARS-CoV-2 RNA is generally  detectable in upper and lower respiratory sp ecimens during the acute  phase of infection. The expected result is Negative. Fact Sheet for Patients:  StrictlyIdeas.no Fact Sheet for Healthcare Providers: BankingDealers.co.za This test is not yet approved or cleared by the [place] FDA and has been authorized for detection and/or diagnosis of SARS-CoV-2 by FDA under an Emergency Use Authorization (EUA).  This EUA will remain in effect (meaning this test can be used) for the duration of the COVID-19 declaration under Section 564(b)(1) of the Act, 21 U.S.C. section 360bbb-3(b)(1), unless the authorization is terminated or revoked sooner. Performed at [facility], [place] [address], [place], [place] [address]   MRSA PCR Screening     Status: None   Collection Time: 02/22/19  4:56 AM   Specimen: Nasal Mucosa; Nasopharyngeal  Result Value Ref Range Status   MRSA by PCR NEGATIVE NEGATIVE Final    Comment:        The GeneXpert MRSA Assay (FDA approved for NASAL specimens only), is one component of a comprehensive MRSA colonization surveillance program. It is not intended to diagnose MRSA infection nor to guide or monitor treatment for MRSA infections. Performed at [facility], [place] [address], [place], [place] [address]   Blood Culture (routine x 2)     Status: Abnormal   Collection Time: 02/22/19  5:33 AM   Specimen: BLOOD  Result Value Ref Range Status   Specimen Description   Final    BLOOD LEFT ANTECUBITAL Performed at [place] [facility] [address], [place], [place] [address]    Special  Requests   Final    BOTTLES DRAWN AEROBIC AND ANAEROBIC Blood Culture adequate volume Performed at [facility] [address] [place], [place] [address]    Culture  Setup Time   Final    GRAM POSITIVE COCCI ANAEROBIC BOTTLE ONLY CRITICAL RESULT CALLED TO, READ BACK BY AND VERIFIED WITH:  PHARMD E JACKSON [id number] AT 1108 BY CM    Culture (A)  Final    STAPHYLOCOCCUS AUREUS SUSCEPTIBILITIES PERFORMED ON PREVIOUS CULTURE WITHIN THE LAST 5 DAYS. Performed at [facility], [place] [address] [place], [place] [address]    Report Status 02/24/2019 FINAL  Final  Aerobic/Anaerobic Culture (surgical/deep wound)     Status: None   Collection Time: 02/22/19 10:05 AM   Specimen: Abscess  Result Value Ref Range Status   Specimen Description   Final    ABSCESS RT PSOAS Performed at [facility] [address] [place], [place] [address]    Special Requests   Final    NONE Performed at [facility], [place] [address], [place], [place] [address]    Gram Stain   Final    RARE WBC PRESENT,BOTH PMN AND MONONUCLEAR FEW GRAM POSITIVE COCCI IN PAIRS    Culture   Final    MODERATE STAPHYLOCOCCUS AUREUS NO ANAEROBES ISOLATED Performed at [facility], [place] [address] [place], [place] [address]    Report Status 02/27/2019 FINAL  Final   Organism ID, Bacteria STAPHYLOCOCCUS AUREUS  Final      Susceptibility   Staphylococcus aureus - MIC*    CIPROFLOXACIN <=0.5 SENSITIVE Sensitive     ERYTHROMYCIN >=8 RESISTANT Resistant     GENTAMICIN <=0.5 SENSITIVE Sensitive     OXACILLIN 0.5 SENSITIVE Sensitive     TETRACYCLINE <=1 SENSITIVE Sensitive     VANCOMYCIN <=0.5 SENSITIVE Sensitive     TRIMETH/SULFA <=10 SENSITIVE Sensitive     CLINDAMYCIN <=0.25 SENSITIVE Sensitive     RIFAMPIN <=0.5 SENSITIVE Sensitive     Inducible Clindamycin NEGATIVE Sensitive     * MODERATE STAPHYLOCOCCUS AUREUS  Culture, respiratory (non-expectorated)      Status: None   Collection Time: 02/22/19  5:30 PM   Specimen: Bronchoalveolar Lavage; Respiratory  Result Value Ref Range Status   Specimen Description   Final    BRONCHIAL ALVEOLAR LAVAGE Performed at [facility] [address] [place], [place] [address]    Special Requests   Final    NONE Performed at [facility], [place] [address] [place], [place] [address]    Gram Stain   Final    ABUNDANT WBC PRESENT, PREDOMINANTLY PMN MODERATE GRAM POSITIVE COCCI Performed at [facility], [place] [address] [place], [place] [address]    Culture MODERATE STAPHYLOCOCCUS AUREUS  Final   Report Status 02/25/2019 FINAL  Final   Organism ID, Bacteria STAPHYLOCOCCUS AUREUS  Final      Susceptibility   Staphylococcus aureus - MIC*    CIPROFLOXACIN <=0.5 SENSITIVE Sensitive     ERYTHROMYCIN >=8 RESISTANT Resistant     GENTAMICIN <=0.5 SENSITIVE Sensitive     OXACILLIN 0.5 SENSITIVE Sensitive     TETRACYCLINE <=1 SENSITIVE Sensitive     VANCOMYCIN 1 SENSITIVE Sensitive     TRIMETH/SULFA <=10 SENSITIVE Sensitive     CLINDAMYCIN <=0.25 SENSITIVE Sensitive     RIFAMPIN <=0.5 SENSITIVE Sensitive     Inducible Clindamycin NEGATIVE Sensitive     * MODERATE STAPHYLOCOCCUS AUREUS  Culture, blood (Routine X 2) w Reflex to ID Panel     Status: None   Collection Time: 02/24/19 11:47 AM   Specimen: BLOOD LEFT HAND  Result Value Ref Range Status   Specimen Description   Final    BLOOD LEFT HAND Performed at [place] [address] [place],  [place] [address]    Special Requests   Final    BOTTLES DRAWN AEROBIC AND ANAEROBIC Blood Culture adequate volume Performed at [facility] [address], [place], [place] [address]    Culture   Final    NO GROWTH 5 DAYS Performed at [facility], [place] [address], [place], [place] [address]    Report Status 03/01/2019 FINAL  Final  Body fluid culture     Status: None    Collection Time: 03/01/19  1:30 PM   Specimen: Chest; Body Fluid  Result Value Ref Range Status   Specimen Description   Final    CHEST Performed at [facility] [address], [place], [place] [address]    Special Requests   Final    Normal Performed at [facility], [place] [address] [place], [place] [address]    Gram Stain   Final    MODERATE WBC PRESENT, PREDOMINANTLY PMN FEW GRAM POSITIVE COCCI IN CLUSTERS Performed at [facility], [place] [address], [place], [place] [address]    Culture RARE STAPHYLOCOCCUS AUREUS  Final   Report Status 03/04/2019 FINAL  Final   Organism ID, Bacteria STAPHYLOCOCCUS AUREUS  Final      Susceptibility   Staphylococcus aureus - MIC*    CIPROFLOXACIN <=0.5 SENSITIVE Sensitive     ERYTHROMYCIN >=8 RESISTANT Resistant     GENTAMICIN <=0.5 SENSITIVE Sensitive     OXACILLIN 0.5 SENSITIVE Sensitive     TETRACYCLINE <=1 SENSITIVE Sensitive     VANCOMYCIN <=0.5 SENSITIVE Sensitive     TRIMETH/SULFA <=10 SENSITIVE Sensitive     CLINDAMYCIN <=0.25 SENSITIVE Sensitive     RIFAMPIN <=0.5 SENSITIVE Sensitive     Inducible Clindamycin NEGATIVE Sensitive     * RARE STAPHYLOCOCCUS AUREUS  Culture, blood (routine x 2)     Status: None   Collection Time: 03/02/19  7:10 PM   Specimen: BLOOD RIGHT ARM  Result Value Ref Range Status   Specimen Description   Final    BLOOD RIGHT ARM Performed at [facility], [address], [place], [place] [address]    Special Requests   Final    BOTTLES DRAWN AEROBIC AND ANAEROBIC Blood Culture adequate volume Performed at [facility] [address], [place], [place] [address]    Culture   Final    NO GROWTH 5 DAYS Performed at [facility], [place] [address] [place], [place] [address]    Report Status 03/07/2019 FINAL  Final  Culture, blood (routine x 2)     Status: None   Collection Time: 03/02/19  7:17 PM   Specimen: BLOOD LEFT ARM  Result  Value Ref Range Status   Specimen Description   Final    BLOOD LEFT ARM Performed at [facility], [place] [address] [place], [place] [address]    Special Requests   Final    BOTTLES DRAWN AEROBIC ONLY Blood Culture adequate volume Performed at [facility] [address], [place], [place] [address]    Culture   Final    NO GROWTH 5 DAYS Performed at [facility], [place] [address], [place], [place] [address]    Report Status 03/07/2019 FINAL  Final    Studies/Results: Vas [place] Upper Extremity Venous Duplex  Result Date: 03/07/2019 UPPER VENOUS STUDY  Indications: Pain Comparison Study: no prior Performing Technologist: Abram Sander RVS  Examination Guidelines: A complete evaluation  includes B-mode imaging, spectral Doppler, color Doppler, and power Doppler as needed of all accessible portions of each vessel. Bilateral testing is considered an integral part of a complete examination. Limited examinations for reoccurring indications may be performed as noted.  Right Findings: +----------+------------+---------+-----------+----------+-------+  RIGHT      Compressible Phasicity Spontaneous Properties Summary  +----------+------------+---------+-----------+----------+-------+  Subclavian                 Yes        Yes                         +----------+------------+---------+-----------+----------+-------+  Left Findings: +----------+------------+---------+-----------+----------+-------+  LEFT       Compressible Phasicity Spontaneous Properties Summary  +----------+------------+---------+-----------+----------+-------+  IJV            Full        Yes        Yes                         +----------+------------+---------+-----------+----------+-------+  Subclavian     Full        Yes        Yes                         +----------+------------+---------+-----------+----------+-------+  Axillary       Full        Yes        Yes                          +----------+------------+---------+-----------+----------+-------+  Brachial       Full        Yes        Yes                         +----------+------------+---------+-----------+----------+-------+  Radial         Full                                               +----------+------------+---------+-----------+----------+-------+  Ulnar          Full                                               +----------+------------+---------+-----------+----------+-------+  Cephalic       Full                                               +----------+------------+---------+-----------+----------+-------+  Basilic        Full                                               +----------+------------+---------+-----------+----------+-------+  Summary:  Right: No evidence of thrombosis in the subclavian.  Left: No evidence of deep vein thrombosis in the upper extremity. No evidence of superficial vein thrombosis in the upper extremity. No evidence of thrombosis  in the subclavian.  *See table(s) above for measurements and observations.    Preliminary       Assessment/Plan:  INTERVAL HISTORY:  TTE shows R Ventricular vegetation and smaller pericardial effusion  Principal Problem:   Sepsis ([place]) Active Problems:   Dyspnea   Heroin use   Endocarditis   Septic pulmonary embolism (HCC)   Osteomyelitis of thoracic spine (HCC)   Osteomyelitis of lumbar spine (HCC)   Acute encephalopathy   AKI (acute kidney injury) ([place])   Transaminitis   Psoas muscle abscess (HCC)   Pressure injury of skin   Mucus clot in bronchi   Protein-calorie malnutrition, severe   Abnormal echocardiogram   MSSA bacteremia   Acute pulmonary embolism (HCC)   Loculated pleural effusion   Empyema, left (HCC)    Franklin Anderson is a 48 y.o. 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 He has septic embolization to the lungs with cavitary pathology and now loculated pneumothorax concerning for empyema he also has a pulmonary embolism. He has BP fistula.  He is sp Chest TUBE placement He does also have a small pericardial effusion.  Repeat TTE shows RV vegetation that I had not seen in the TEE read from last week and smaller pericardial effusion  #1 Metastatic MSSAB as described:       [facility] [facility] Management Team Staphylococcus aureus bacteremia   Staphylococcus aureus bacteremia (SAB) is associated with a high rate of complications and mortality.  Specific aspects of clinical management are critical to optimizing the outcome of patients with SAB.  Therefore, the [facility] Antimicrobial Management Team [facility]) has initiated an intervention aimed at improving the management of SAB at [facility].  To do so, Infectious Diseases physicians are providing an evidence-based consult for the management of all patients with SAB.     Yes No Comments  Perform follow-up blood cultures (even if the patient is afebrile) to ensure clearance of bacteremia _0  _1  Repeated post central line re and removal and NG FINAL  Remove vascular catheter and obtain follow-up blood cultures after the removal of the catheter _2  _3  coud have PICC IF he is going to stay here for all of his IV antibiiotics  Perform echocardiography to evaluate for endocarditis (transthoracic ECHO is 40-50% sensitive, TEE is > 90% sensitive) _4  _5  TEE was NOT negative but in fact + RVentricular lesion that is likely a vegetation + pericardial effusion  Consult electrophysiologist to evaluate implanted cardiac device (pacemaker, ICD) _6  _7    Ensure source control _8  _9  Need to monitor Neuro exam, CT tube in place but might need decortication, agree with repeat CT next today  Investigate for metastatic sites of infection _10  _11  Does the patient have ANY symptom or physical exam finding that  would suggest a deeper infection (back or neck pain that may be suggestive of vertebral osteomyelitis or epidural abscess, muscle pain that could be a symptom of pyomyositis)?  Keep in mind that for deep seeded infections MRI imaging with contrast is preferred rather than other often insensitive tests such as plain x-rays, especially early in a patient's presentation.  Change antibiotic therapy to cefazolin _12  _13  Beta-lactam antibiotics are preferred for MSSA due to higher cure rates.   If on Vancomycin, goal trough should be 15 - 20 mcg/mL  Estimated duration of IV antibiotic therapy:  6-8 weeks but would need to be int he hospital _0  _1  Consult case management for probably prolonged outpatient IV antibiotic therapy   #2 RVentricular vegeation  Endocarditis Evaluation   Infective endocarditis (IE) is associated with a high rate of complications and mortality.  Specific aspects of clinical management are critical to optimizing the outcome of patients with IE. Therefore, the Pharmacy Residency Program at [facility] has initiated an evidence-based consult aimed at improving the management of IE at [facility].     Comments  Consult to ID Outpatient follow-up appointment to be made prior to discharge.  Utilization of ID recommended antibiotic therapy   Surgery Evaluation Cardiothoracic surgery consult if the following indications are present:  - Heart failure associated to valve dysfunction - Endocarditis complicated by heart block or aortic abscess - Left-sided endocarditis caused by S. aureus, fungal, or multi-drug resistant organisms - Left-sided endocarditis with severe valve dysfunction - Large vegetation (>1 cm) on aortic or mitral valve - Persistence of infection 5-7 days after initiation of appropriate antibiotic therapy - Recurrent emboli and persistent vegetations despite appropriate antibiotic therapy - Any history with left-sided embolization with persistent vegetations on TEE   Consult electrophysiologist if presence of implanted cardiac device (pacemaker, ICD)   Consult to cardiology if left ventricular ejection fraction is <40% Evaluation for appropriate heart failure medications upon discharge: - ACE/ARB - Diuretic - Beta blocker  Outpatient follow-up appointment to be made prior to discharge.   Based on the ID MD's evaluation of this patient, the following are recommended to complete a thorough evaluation and care plan to reflect guideline-recommended therapy = ID appt to be maade     #3  Lumbar diskitis/vertebral osteomyelitis: monitor Neuro exam and continue antibiotics  #4  Loculated effusion with pneumothorax and BP fistula  Attempts to manage medically at present but may need CVTS  I will get CT today  #5 Pericardial effusion:  repeat TTE shows it is smaller  #6 Neck pain at IJ site: I ordered dopple to make sure no clot though I doubt there is and it is now feeling better to him   #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    LOS: 13 days   Alcide Evener 03/07/2019, 10:22 AM

## 2019-03-07 NOTE — Progress Notes (Signed)
Physical Therapy Treatment Patient Details Name: Franklin Anderson MRN: [id number] DOB: Dec 26, 1970 Today's Date: 03/07/2019    History of Present Illness Pt is 48 y/o M with PMH: IVDA (heroine), who presented with worsening back pain and B LE weakness. 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 Pt was intubated 10/28, extubated 10/31.    PT Comments    Pt in bed c/o 10/10 low back/sacral pain.  General bed mobility comments: Pt only agreed to perform side to side rolling only.  Declined any OOB attempt.  Declined any EOB due to pain/effort.  Pt did agree for [place] to come back tomorrow and "try".  Pt reported "they got me up to the bed side commode last night and the pain about killed me". After performing limited bed mobility, positioned with pillows to comfort and educated on importance of "moving" and "repositioning" as pt is present with bony prominences and HIGH RISK for bed sores. Suggested an Engineer, water.     Follow Up Recommendations  Home health PT;SNF(pending progress and pt compliance)     Equipment Recommendations       Recommendations for Other Services       Precautions / Restrictions Precautions Precautions: Fall Precaution Comments: chest tube Restrictions Weight Bearing Restrictions: No    Mobility  Bed Mobility Overal bed mobility: Needs Assistance Bed Mobility: Rolling Rolling: Min guard   Supine to sit: HOB elevated;Min assist     General bed mobility comments: Pt only agreed to perform side to side rolling only.  Declined any OOB attempt.  Declined any EOB due to pain/effort.  Transfers                 General transfer comment: pt declined  Ambulation/Gait             General Gait Details: pt declined   Stairs             Wheelchair Mobility    Modified Rankin (Stroke Patients Only)       Balance                                             Cognition Arousal/Alertness: Awake/alert                                     General Comments: pt is frustrated.  Expresses a lot pain.      Exercises      General Comments        Pertinent Vitals/Pain Pain Assessment: 0-10 Pain Score: 10-Worst pain ever Pain Location: low back/sacrum area Pain Descriptors / Indicators: Discomfort;Guarding;Sharp;Shooting;Stabbing;Grimacing;Constant Pain Intervention(s): Monitored during session;Repositioned    Home Living                      Prior Function            PT Goals (current goals can now be found in the care plan section) Progress towards PT goals: Progressing toward goals    Frequency    Min 3X/week      PT Plan Current plan remains appropriate    Co-evaluation              AM-PAC PT "6 Clicks" Mobility   Outcome Measure  Help  needed turning from your back to your side while in a flat bed without using bedrails?: A Little Help needed moving from lying on your back to sitting on the side of a flat bed without using bedrails?: A Little Help needed moving to and from a bed to a chair (including a wheelchair)?: A Lot Help needed standing up from a chair using your arms (e.g., wheelchair or bedside chair)?: A Lot Help needed to walk in hospital room?: A Lot Help needed climbing 3-5 steps with a railing? : Total 6 Click Score: 13    End of Session   Activity Tolerance: Patient limited by pain Patient left: in bed;with call bell/phone within reach;with bed alarm set Nurse Communication: Mobility status PT Visit Diagnosis: Muscle weakness (generalized) (M62.81);Difficulty in walking, not elsewhere classified (R26.2)     Time: 1425-1440 PT Time Calculation (min) (ACUTE ONLY): 15 min  Charges:  $Therapeutic Activity: 8-22 mins                     Rica Koyanagi  PTA Acute  Rehabilitation Services Pager      [phone] Office      [phone]

## 2019-03-08 ENCOUNTER — Inpatient Hospital Stay (HOSPITAL_COMMUNITY): Payer: Self-pay

## 2019-03-08 DIAGNOSIS — L988 Other specified disorders of the skin and subcutaneous tissue: Secondary | ICD-10-CM

## 2019-03-08 DIAGNOSIS — R52 Pain, unspecified: Secondary | ICD-10-CM

## 2019-03-08 DIAGNOSIS — J939 Pneumothorax, unspecified: Secondary | ICD-10-CM

## 2019-03-08 DIAGNOSIS — M542 Cervicalgia: Secondary | ICD-10-CM

## 2019-03-08 DIAGNOSIS — Z9689 Presence of other specified functional implants: Secondary | ICD-10-CM

## 2019-03-08 LAB — COMPREHENSIVE METABOLIC PANEL
ALT: 42 U/L (ref 0–44)
AST: 73 U/L — ABNORMAL HIGH (ref 15–41)
Albumin: 1.7 g/dL — ABNORMAL LOW (ref 3.5–5.0)
Alkaline Phosphatase: 92 U/L (ref 38–126)
Anion gap: 9 (ref 5–15)
BUN: 11 mg/dL (ref 6–20)
CO2: 28 mmol/L (ref 22–32)
Calcium: 8.2 mg/dL — ABNORMAL LOW (ref 8.9–10.3)
Chloride: 97 mmol/L — ABNORMAL LOW (ref 98–111)
Creatinine, Ser: 0.39 mg/dL — ABNORMAL LOW (ref 0.61–1.24)
GFR calc Af Amer: >60 mL/min (ref >60)
GFR calc non Af Amer: >60 mL/min (ref >60)
Glucose, Bld: 111 mg/dL — ABNORMAL HIGH (ref 70–99)
Potassium: 4.2 mmol/L (ref 3.5–5.1)
Sodium: 134 mmol/L — ABNORMAL LOW (ref 135–145)
Total Bilirubin: 0.4 mg/dL (ref 0.3–1.2)
Total Protein: 6.5 g/dL (ref 6.5–8.1)

## 2019-03-08 LAB — CBC
HCT: 33.5 % — ABNORMAL LOW (ref 39.0–52.0)
Hemoglobin: 10.6 g/dL — ABNORMAL LOW (ref 13.0–17.0)
MCH: 29.1 pg (ref 26.0–34.0)
MCHC: 31.6 g/dL (ref 30.0–36.0)
MCV: 92 fL (ref 80.0–100.0)
Platelets: 304 10*3/uL (ref 150–400)
RBC: 3.64 MIL/uL — ABNORMAL LOW (ref 4.22–5.81)
RDW: 18 % — ABNORMAL HIGH (ref 11.5–15.5)
WBC: 8.1 10*3/uL (ref 4.0–10.5)
nRBC: 0 % (ref 0.0–0.2)

## 2019-03-08 LAB — GLUCOSE, CAPILLARY
Glucose-Capillary: 103 mg/dL — ABNORMAL HIGH (ref 70–99)
Glucose-Capillary: 111 mg/dL — ABNORMAL HIGH (ref 70–99)
Glucose-Capillary: 128 mg/dL — ABNORMAL HIGH (ref 70–99)
Glucose-Capillary: 117 mg/dL — ABNORMAL HIGH (ref 70–99)

## 2019-03-08 IMAGING — CT CT CHEST W/ CM
2 of 3 series · 15 of 36 positions shown, 18 images · IV contrast (omnipaque)
Comparison: [DATE]

CLINICAL DATA: Septic pulmonary emboli; history rheumatic fever,
hypertension, drug abuse

EXAM:
CT CHEST WITH CONTRAST
TECHNIQUE: Multidetector CT imaging of the chest was performed during
intravenous contrast administration. Sagittal and coronal MPR images
reconstructed from axial data set.
CONTRAST:  75mL OMNIPAQUE IOHEXOL 300 MG/ML  SOLN IV

[Series 2: axial st · axial · 0.80mm/px · z∈[+1196,+1502]mm · 12 of 181 slices shown, 15 images]
[im 14/181  mediastinal]
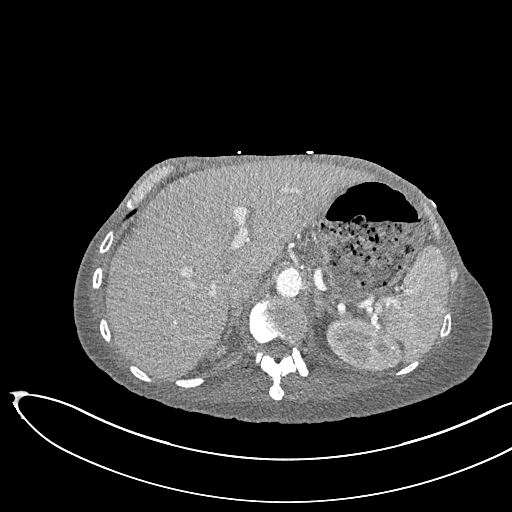
[im 14/181  lung]
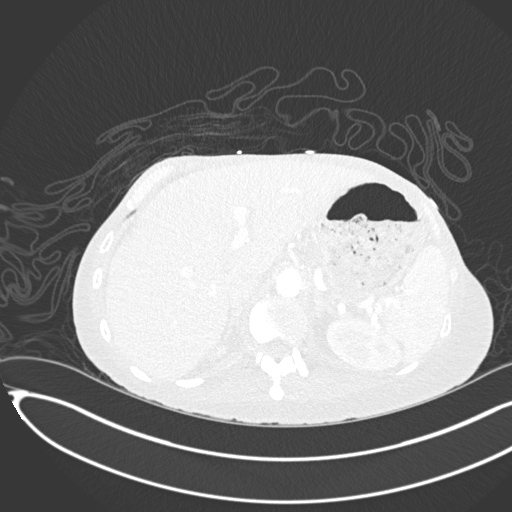
[im 27/181  lung]
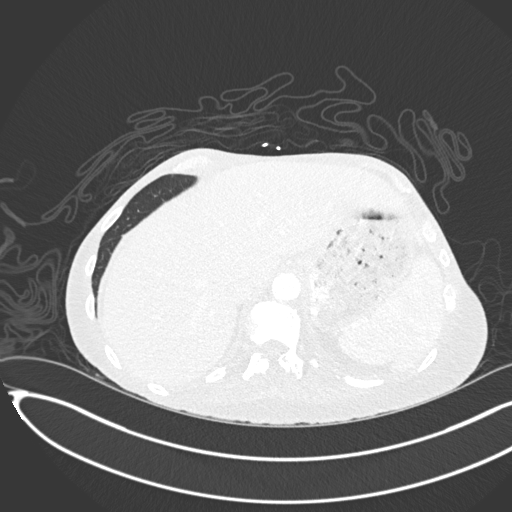
[im 41/181  lung]
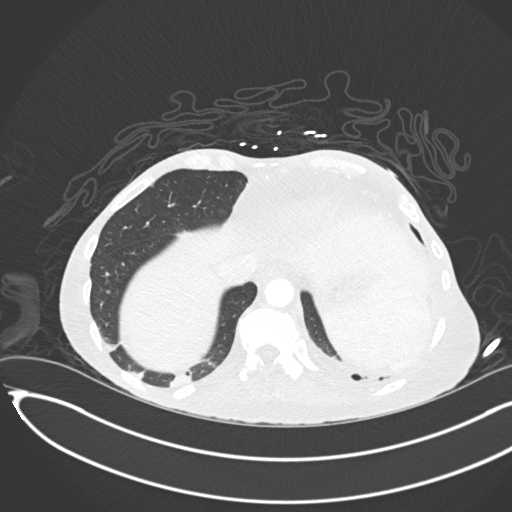
[im 54/181  lung]
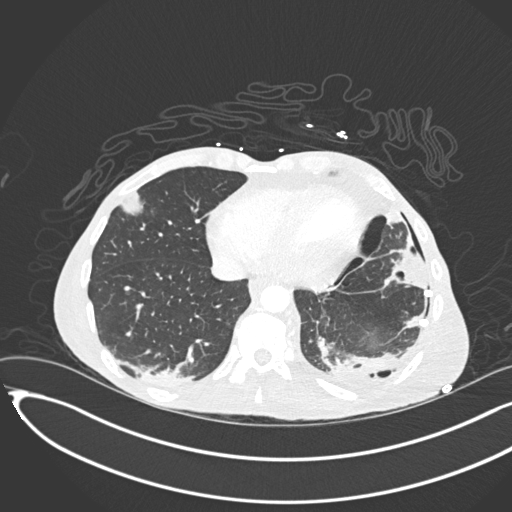
[im 67/181  mediastinal]
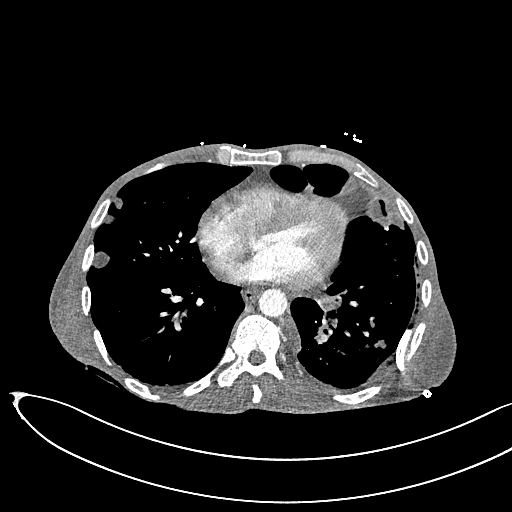
[im 67/181  lung]
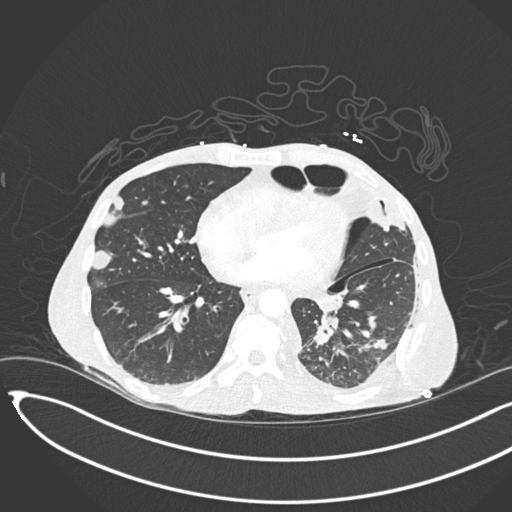
[im 81/181  lung]
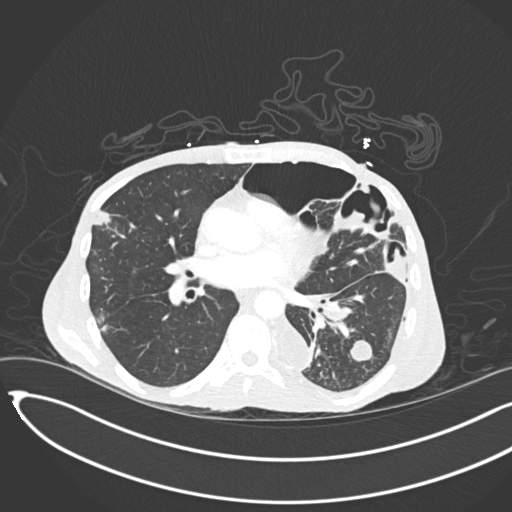
[im 101/181  lung]
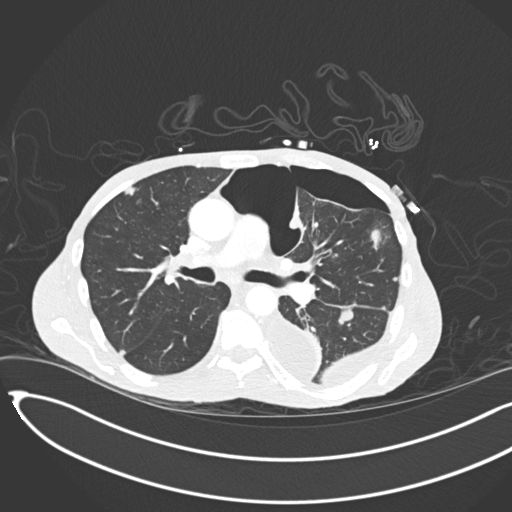
[im 114/181  lung]
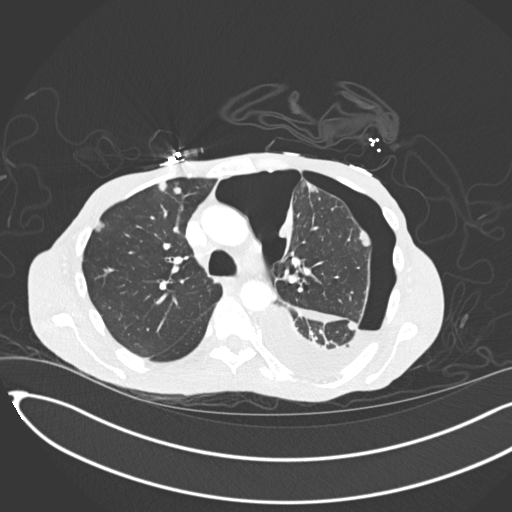
[im 127/181  mediastinal]
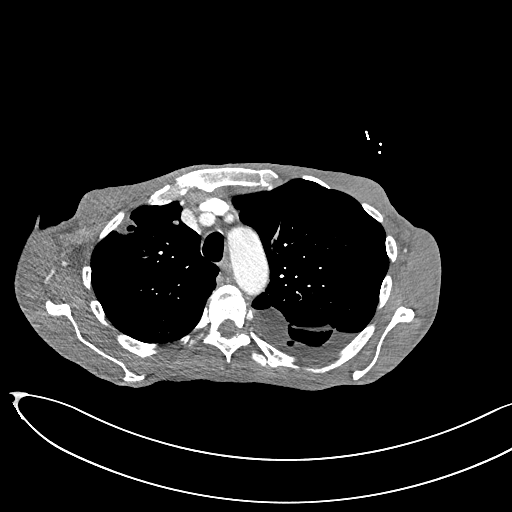
[im 127/181  lung]
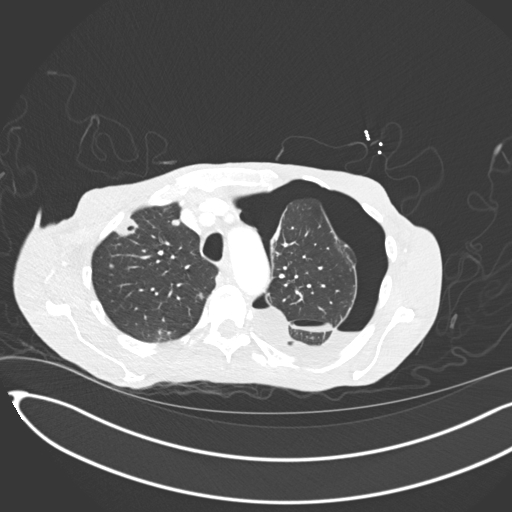
[im 141/181  lung]
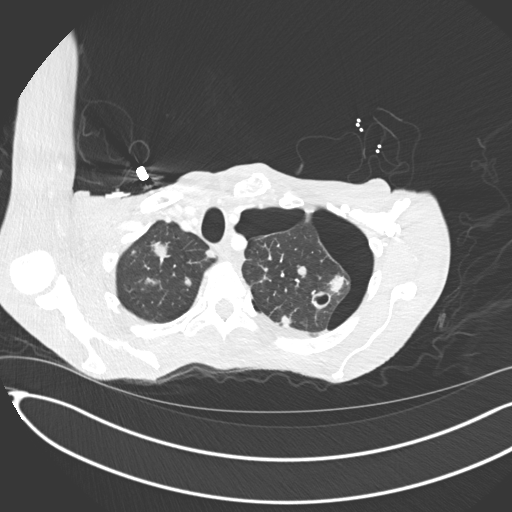
[im 154/181  lung]
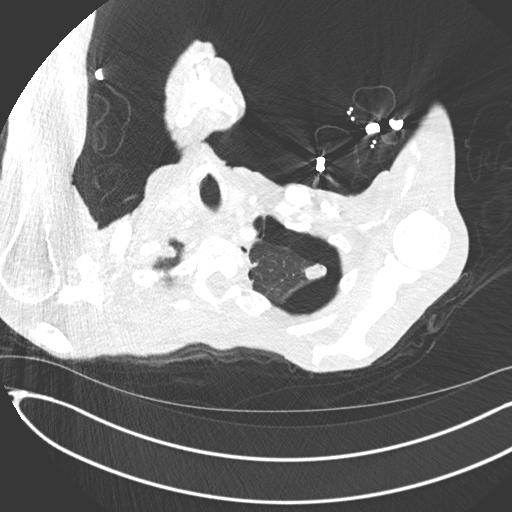
[im 167/181  lung]
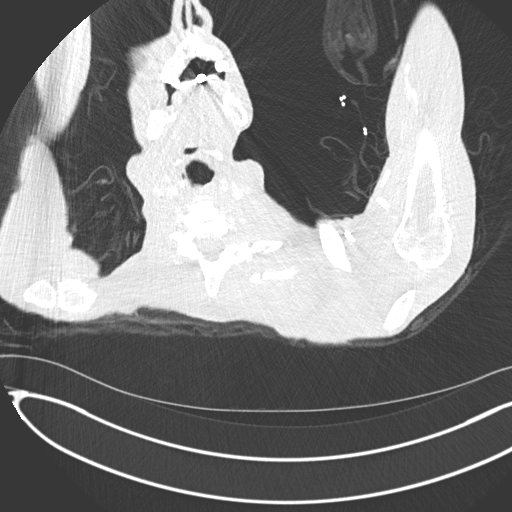

[Series 5: coronal · coronal · 0.69mm/px · 3 of 116 slices shown]
[im 24/116  lung]
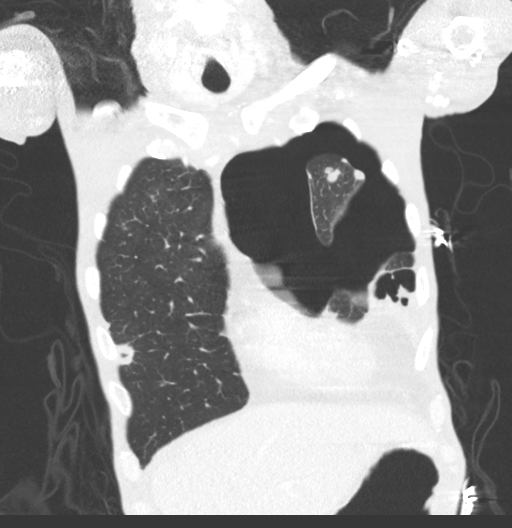
[im 47/116  lung]
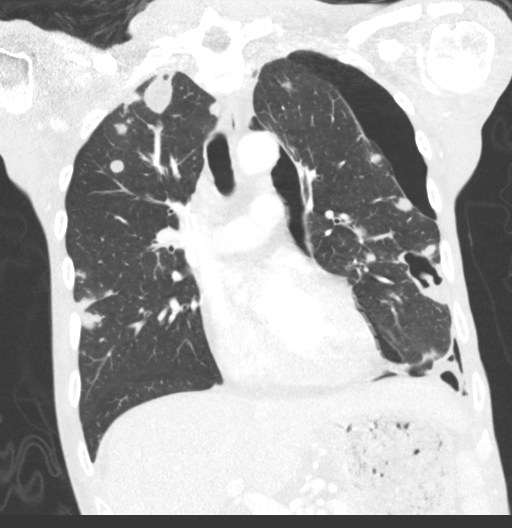
[im 70/116  lung]
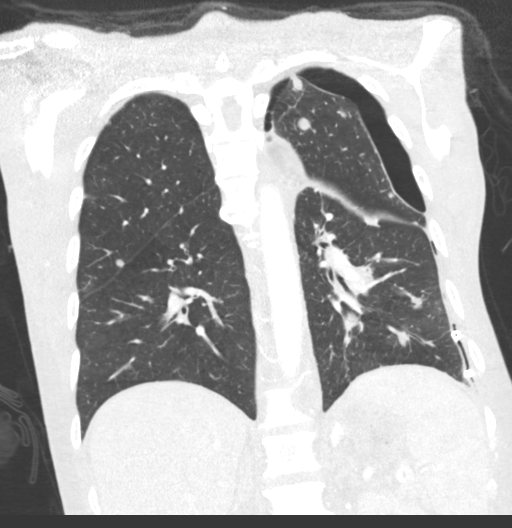

[15 of 36 positions shown; findings below may reference images not displayed]

FINDINGS: Cardiovascular: Vascular structures patent. No pericardial effusion.
Heart normal size. Pulmonary arteries grossly unremarkable for phase
of imaging.

Mediastinum/Nodes: Base of cervical region normal appearance.
Patient cachectic. No definite adenopathy.

Lungs/Pleura: Multiple masses are identified throughout both lungs,
many cavitary, consistent with septic emboli. Largest RIGHT lung
lesion 2.7 x 2.7 cm RIGHT middle lobe image 123. Largest LEFT lung
lesion in lingula 4.7 x 4.5 cm image 101. Dependent atelectasis in
the posterior lungs bilaterally. Interval decrease in size of
significant LEFT pneumothorax component. Partially loculated LEFT
pleural effusion with air-fluid level.

Upper Abdomen: Small cyst upper LEFT kidney. Remaining visualized
upper abdomen unremarkable.

Musculoskeletal: No acute osseous lesions.
IMPRESSION: Persistent loculated LEFT hydropneumothorax, with decrease in
pneumothorax component since previous exam.

Septic emboli throughout both lungs, little changed.

## 2019-03-08 MED ORDER — IOHEXOL 300 MG/ML  SOLN
75.0000 mL | Freq: Once | INTRAMUSCULAR | Status: AC | PRN
  Administered 2019-03-08: 75 mL via INTRAVENOUS

## 2019-03-08 MED ORDER — SODIUM CHLORIDE (PF) 0.9 % IJ SOLN
INTRAMUSCULAR | Status: AC
  Filled 2019-03-08: qty 50

## 2019-03-08 NOTE — TOC Progression Note (Signed)
Transition of Care [facility]) - Progression Note    Patient Details  Name: Franklin Anderson MRN: [id number] Date of Birth: 29-Apr-1970  Transition of Care [facility]) CM/SW [place], [place] Phone Number: 03/08/2019, 2:35 PM  Clinical Narrative:  Contacted both  [facility] and [facility] for referral purposes.  Both informed me that cannot work with someone who does not have a payor source, and neither does charity care.    Expected Discharge Plan: Home/Self Care Barriers to Discharge: No Barriers Identified  Expected Discharge Plan and Services Expected Discharge Plan: Home/Self Care In-house Referral: Clinical Social Work     Living arrangements for the past 2 months: Single Family Home                                       Social Determinants of Health (SDOH) Interventions    Readmission Risk Interventions No flowsheet data found.

## 2019-03-08 NOTE — Progress Notes (Addendum)
Subjective:  Back pain worse after having had CT  Antibiotics:  Anti-infectives (From admission, onward)   Start     Dose/Rate Route Frequency Ordered Stop   02/24/19 2200  vancomycin (VANCOCIN) IVPB 1000 mg/200 mL premix  Status:  Discontinued     1,000 mg 200 mL/hr over 60 Minutes Intravenous Every 12 hours 02/24/19 1010 02/24/19 1253   02/24/19 1600  ceFAZolin (ANCEF) IVPB 2g/100 mL premix     2 g 200 mL/hr over 30 Minutes Intravenous Every 8 hours 02/24/19 1253     02/23/19 2200  vancomycin (VANCOCIN) IVPB 750 mg/150 ml premix  Status:  Discontinued     750 mg 150 mL/hr over 60 Minutes Intravenous Every 24 hours 02/23/19 0853 02/23/19 0909   02/23/19 2200  vancomycin (VANCOCIN) IVPB 750 mg/150 ml premix  Status:  Discontinued     750 mg 150 mL/hr over 60 Minutes Intravenous Every 12 hours 02/23/19 0909 02/24/19 1010   02/22/19 2200  vancomycin (VANCOCIN) IVPB 750 mg/150 ml premix  Status:  Discontinued     750 mg 150 mL/hr over 60 Minutes Intravenous Every 24 hours 02/22/19 0826 02/22/19 0827   02/22/19 2200  vancomycin (VANCOCIN) IVPB 1000 mg/200 mL premix  Status:  Discontinued     1,000 mg 200 mL/hr over 60 Minutes Intravenous Every 24 hours 02/22/19 0827 02/23/19 0853   02/22/19 0900  ceFEPIme (MAXIPIME) 2 g in sodium chloride 0.9 % 100 mL IVPB  Status:  Discontinued     2 g 200 mL/hr over 30 Minutes Intravenous Every 12 hours 02/22/19 0826 02/23/19 1135   02/21/19 2015  ceFEPIme (MAXIPIME) 2 g in sodium chloride 0.9 % 100 mL IVPB     2 g 200 mL/hr over 30 Minutes Intravenous  Once 02/21/19 2013 02/22/19 0031   02/21/19 2015  metroNIDAZOLE (FLAGYL) IVPB 500 mg     500 mg 100 mL/hr over 60 Minutes Intravenous  Once 02/21/19 2013 02/22/19 0031   02/21/19 2015  vancomycin (VANCOCIN) IVPB 1000 mg/200 mL premix     1,000 mg 200 mL/hr over 60 Minutes Intravenous  Once 02/21/19 2013 02/22/19 0200      Medications: Scheduled Meds:  amLODipine  10 mg Oral Daily    Chlorhexidine Gluconate Cloth  6 each Topical Daily   feeding supplement (ENSURE ENLIVE)  237 mL Oral BID BM   feeding supplement (PRO-STAT SUGAR FREE 64)  30 mL Oral BID   insulin aspart  0-24 Units Subcutaneous TID AC & HS   mouth rinse  15 mL Mouth Rinse BID   metoprolol tartrate  50 mg Oral BID   multivitamin with minerals  1 tablet Oral Daily   oxyCODONE  10 mg Oral Q12H   pantoprazole  40 mg Oral Daily   sodium chloride (PF)       sodium chloride flush  10-40 mL Intracatheter Q12H   Continuous Infusions:  sodium chloride 250 mL (03/07/19 2139)    ceFAZolin (ANCEF) IV 2 g (03/08/19 1521)   sodium chloride     PRN Meds:.sodium chloride, acetaminophen **OR** acetaminophen, acetaminophen, clonazePAM, hydrALAZINE, HYDROmorphone (DILAUDID) injection, ipratropium-albuterol, ondansetron **OR** ondansetron (ZOFRAN) IV, oxyCODONE-acetaminophen, senna-docusate, sodium chloride flush    Objective: Weight change: -1.9 kg  Intake/Output Summary (Last 24 hours) at 03/08/2019 1623 Last data filed at 03/08/2019 1521 Gross per 24 hour  Intake 1045.87 ml  Output 2900 ml  Net -1854.13 ml   Blood pressure (!) 143/101, pulse (!) 113, temperature  99.8 F (37.7 C), temperature source Oral, resp. rate 18, height 6' (1.829 m), weight 55 kg, SpO2 95 %. Temp:  [98.4 F (36.9 C)-99.8 F (37.7 C)] 99.8 F (37.7 C) (11/11 1342) Pulse Rate:  [101-121] 113 (11/11 1342) Resp:  [16-18] 18 (11/11 1342) BP: (115-154)/(83-103) 143/101 (11/11 1342) SpO2:  [95 %-98 %] 95 % (11/11 1342) Weight:  [55 kg] 55 kg (11/11 0500)  Physical Exam: General: Alert and awake, oriented x3,cachectic, facial wasting, sitting up and eating breakfast HEENT: anicteric sclera, EOMI CVStach rate, normal  No mgr Chest: , coarse breath sounds, CT in place with bloody , purulent material Abdomen: soft non-distended,  Extremities: 3+ edema in LE Skin: no rashes, + tatooos Neuro:  nonfocal  CBC:    BMET Recent Labs    03/08/19 0610  NA 134*  K 4.2  CL 97*  CO2 28  GLUCOSE 111*  BUN 11  CREATININE 0.39*  CALCIUM 8.2*     Liver Panel  Recent Labs    03/08/19 0610  PROT 6.5  ALBUMIN 1.7*  AST 73*  ALT 42  ALKPHOS 92  BILITOT 0.4       Sedimentation Rate No results for input(s): ESRSEDRATE in the last 72 hours. C-Reactive Protein No results for input(s): CRP in the last 72 hours.  Micro Results: Recent Results (from the past 720 hour(s))  Blood Culture (routine x 2)     Status: Abnormal   Collection Time: 02/21/19  8:13 PM   Specimen: BLOOD LEFT FOREARM  Result Value Ref Range Status   Specimen Description   Final    BLOOD LEFT FOREARM Performed at [facility], [address] [place], [place] [address]    Special Requests   Final    BOTTLES DRAWN AEROBIC AND ANAEROBIC Blood Culture results may not be optimal due to an inadequate volume of blood received in culture bottles Performed at [place] [address], [place], [place] [address]    Culture  Setup Time   Final    GRAM POSITIVE COCCI IN BOTH AEROBIC AND ANAEROBIC BOTTLES CRITICAL RESULT CALLED TO, READ BACK BY AND VERIFIED WITH: PHARMD J LEGGE [id number] AT 1337 BY CM CRITICAL RESULT CALLED TO, READ BACK BY AND VERIFIED WITH: PHARMD E JACKSON [id number] AT 1357 BY CM Performed at [facility], [place] [address], [place], [place] [address]    Culture STAPHYLOCOCCUS AUREUS (A)  Final   Report Status 02/24/2019 FINAL  Final   Organism ID, Bacteria STAPHYLOCOCCUS AUREUS  Final      Susceptibility   Staphylococcus aureus - MIC*    CIPROFLOXACIN <=0.5 SENSITIVE Sensitive     ERYTHROMYCIN >=8 RESISTANT Resistant     GENTAMICIN <=0.5 SENSITIVE Sensitive     OXACILLIN 0.5 SENSITIVE Sensitive     TETRACYCLINE <=1 SENSITIVE Sensitive     VANCOMYCIN 1 SENSITIVE Sensitive     TRIMETH/SULFA <=10 SENSITIVE Sensitive     CLINDAMYCIN <=0.25 SENSITIVE  Sensitive     RIFAMPIN <=0.5 SENSITIVE Sensitive     Inducible Clindamycin NEGATIVE Sensitive     * STAPHYLOCOCCUS AUREUS  SARS Coronavirus 2 by RT PCR (hospital order, performed in [place] hospital lab) Nasopharyngeal Nasopharyngeal Swab     Status: None   Collection Time: 02/21/19  8:16 PM   Specimen: Nasopharyngeal Swab  Result Value Ref Range Status   SARS Coronavirus 2 NEGATIVE NEGATIVE Final    Comment: (NOTE) If result is NEGATIVE SARS-CoV-2 target nucleic acids  are NOT DETECTED. The SARS-CoV-2 RNA is generally detectable in upper and lower  respiratory specimens during the acute phase of infection. The lowest  concentration of SARS-CoV-2 viral copies this assay can detect is 250  copies / mL. A negative result does not preclude SARS-CoV-2 infection  and should not be used as the sole basis for treatment or other  patient management decisions.  A negative result may occur with  improper specimen collection / handling, submission of specimen other  than nasopharyngeal swab, presence of viral mutation(s) within the  areas targeted by this assay, and inadequate number of viral copies  (<250 copies / mL). A negative result must be combined with clinical  observations, patient history, and epidemiological information. If result is POSITIVE SARS-CoV-2 target nucleic acids are DETECTED. The SARS-CoV-2 RNA is generally detectable in upper and lower  respiratory specimens dur ing the acute phase of infection.  Positive  results are indicative of active infection with SARS-CoV-2.  Clinical  correlation with patient history and other diagnostic information is  necessary to determine patient infection status.  Positive results do  not rule out bacterial infection or co-infection with other viruses. If result is PRESUMPTIVE POSTIVE SARS-CoV-2 nucleic acids MAY BE PRESENT.   A presumptive positive result was obtained on the submitted specimen  and confirmed on repeat testing.  While  2019 novel coronavirus  (SARS-CoV-2) nucleic acids may be present in the submitted sample  additional confirmatory testing may be necessary for epidemiological  and / or clinical management purposes  to differentiate between  SARS-CoV-2 and other Sarbecovirus currently known to infect humans.  If clinically indicated additional testing with an alternate test  methodology [phone]) is advised. The SARS-CoV-2 RNA is generally  detectable in upper and lower respiratory sp ecimens during the acute  phase of infection. The expected result is Negative. Fact Sheet for Patients:  StrictlyIdeas.no Fact Sheet for Healthcare Providers: BankingDealers.co.za This test is not yet approved or cleared by the [place] FDA and has been authorized for detection and/or diagnosis of SARS-CoV-2 by FDA under an Emergency Use Authorization (EUA).  This EUA will remain in effect (meaning this test can be used) for the duration of the COVID-19 declaration under Section 564(b)(1) of the Act, 21 U.S.C. section 360bbb-3(b)(1), unless the authorization is terminated or revoked sooner. Performed at [facility], [place] [address], [place], [place] [address]   MRSA PCR Screening     Status: None   Collection Time: 02/22/19  4:56 AM   Specimen: Nasal Mucosa; Nasopharyngeal  Result Value Ref Range Status   MRSA by PCR NEGATIVE NEGATIVE Final    Comment:        The GeneXpert MRSA Assay (FDA approved for NASAL specimens only), is one component of a comprehensive MRSA colonization surveillance program. It is not intended to diagnose MRSA infection nor to guide or monitor treatment for MRSA infections. Performed at [facility], [place] [address], [place], [place] [address]   Blood Culture (routine x 2)     Status: Abnormal   Collection Time: 02/22/19  5:33 AM   Specimen: BLOOD  Result Value Ref Range Status   Specimen  Description   Final    BLOOD LEFT ANTECUBITAL Performed at [place] [address] [place], [place] [address]    Special Requests   Final    BOTTLES DRAWN AEROBIC AND ANAEROBIC Blood Culture adequate volume Performed at [place] [address] [place],  [place] [address]    Culture  Setup Time   Final    GRAM POSITIVE COCCI ANAEROBIC BOTTLE ONLY CRITICAL RESULT CALLED TO, READ BACK BY AND VERIFIED WITH: PHARMD E JACKSON [id number] AT 1108 BY CM    Culture (A)  Final    STAPHYLOCOCCUS AUREUS SUSCEPTIBILITIES PERFORMED ON PREVIOUS CULTURE WITHIN THE LAST 5 DAYS. Performed at [facility], [place] [address] [place], [place] [address]    Report Status 02/24/2019 FINAL  Final  Aerobic/Anaerobic Culture (surgical/deep wound)     Status: None   Collection Time: 02/22/19 10:05 AM   Specimen: Abscess  Result Value Ref Range Status   Specimen Description   Final    ABSCESS RT PSOAS Performed at [place] [address], [place], [place] [address]    Special Requests   Final    NONE Performed at [facility], [place] [address], [place], [place] [address]    Gram Stain   Final    RARE WBC PRESENT,BOTH PMN AND MONONUCLEAR FEW GRAM POSITIVE COCCI IN PAIRS    Culture   Final    MODERATE STAPHYLOCOCCUS AUREUS NO ANAEROBES ISOLATED Performed at [facility], [place] [address] [place], [place] [address]    Report Status 02/27/2019 FINAL  Final   Organism ID, Bacteria STAPHYLOCOCCUS AUREUS  Final      Susceptibility   Staphylococcus aureus - MIC*    CIPROFLOXACIN <=0.5 SENSITIVE Sensitive     ERYTHROMYCIN >=8 RESISTANT Resistant     GENTAMICIN <=0.5 SENSITIVE Sensitive     OXACILLIN 0.5 SENSITIVE Sensitive     TETRACYCLINE <=1 SENSITIVE Sensitive     VANCOMYCIN <=0.5 SENSITIVE Sensitive     TRIMETH/SULFA <=10 SENSITIVE Sensitive     CLINDAMYCIN <=0.25 SENSITIVE Sensitive     RIFAMPIN <=0.5  SENSITIVE Sensitive     Inducible Clindamycin NEGATIVE Sensitive     * MODERATE STAPHYLOCOCCUS AUREUS  Culture, respiratory (non-expectorated)     Status: None   Collection Time: 02/22/19  5:30 PM   Specimen: Bronchoalveolar Lavage; Respiratory  Result Value Ref Range Status   Specimen Description   Final    BRONCHIAL ALVEOLAR LAVAGE Performed at [place] [address] [place], [place] [address]    Special Requests   Final    NONE Performed at [facility], [place] [address], [place], [place] [address]    Gram Stain   Final    ABUNDANT WBC PRESENT, PREDOMINANTLY PMN MODERATE GRAM POSITIVE COCCI Performed at [facility], [place] [address], [place],  [address]    Culture MODERATE STAPHYLOCOCCUS AUREUS  Final   Report Status 02/25/2019 FINAL  Final   Organism ID, Bacteria STAPHYLOCOCCUS AUREUS  Final      Susceptibility   Staphylococcus aureus - MIC*    CIPROFLOXACIN <=0.5 SENSITIVE Sensitive     ERYTHROMYCIN >=8 RESISTANT Resistant     GENTAMICIN <=0.5 SENSITIVE Sensitive     OXACILLIN 0.5 SENSITIVE Sensitive     TETRACYCLINE <=1 SENSITIVE Sensitive     VANCOMYCIN 1 SENSITIVE Sensitive     TRIMETH/SULFA <=10 SENSITIVE Sensitive     CLINDAMYCIN <=0.25 SENSITIVE Sensitive     RIFAMPIN <=0.5 SENSITIVE Sensitive     Inducible Clindamycin NEGATIVE Sensitive     * MODERATE STAPHYLOCOCCUS AUREUS  Culture, blood (Routine X 2) w Reflex to ID Panel     Status: None   Collection Time: 02/24/19 11:47 AM   Specimen: BLOOD LEFT  HAND  Result Value Ref Range Status   Specimen Description   Final    BLOOD LEFT HAND Performed at [place] [address] [place], [place] [address]    Special Requests   Final    BOTTLES DRAWN AEROBIC AND ANAEROBIC Blood Culture adequate volume Performed at [place] [address] [place], [place] [address]    Culture   Final    NO GROWTH 5 DAYS Performed  at [facility], [place] [address], [place], [place] [address]    Report Status 03/01/2019 FINAL  Final  Body fluid culture     Status: None   Collection Time: 03/01/19  1:30 PM   Specimen: Chest; Body Fluid  Result Value Ref Range Status   Specimen Description   Final    CHEST Performed at [place] [address], [place], [place] [address]    Special Requests   Final    Normal Performed at [facility], [place] [address] [place], [place] [address]    Gram Stain   Final    MODERATE WBC PRESENT, PREDOMINANTLY PMN FEW GRAM POSITIVE COCCI IN CLUSTERS Performed at [facility], [place] [address] [place], [place] [address]    Culture RARE STAPHYLOCOCCUS AUREUS  Final   Report Status 03/04/2019 FINAL  Final   Organism ID, Bacteria STAPHYLOCOCCUS AUREUS  Final      Susceptibility   Staphylococcus aureus - MIC*    CIPROFLOXACIN <=0.5 SENSITIVE Sensitive     ERYTHROMYCIN >=8 RESISTANT Resistant     GENTAMICIN <=0.5 SENSITIVE Sensitive     OXACILLIN 0.5 SENSITIVE Sensitive     TETRACYCLINE <=1 SENSITIVE Sensitive     VANCOMYCIN <=0.5 SENSITIVE Sensitive     TRIMETH/SULFA <=10 SENSITIVE Sensitive     CLINDAMYCIN <=0.25 SENSITIVE Sensitive     RIFAMPIN <=0.5 SENSITIVE Sensitive     Inducible Clindamycin NEGATIVE Sensitive     * RARE STAPHYLOCOCCUS AUREUS  Culture, blood (routine x 2)     Status: None   Collection Time: 03/02/19  7:10 PM   Specimen: BLOOD RIGHT ARM  Result Value Ref Range Status   Specimen Description   Final    BLOOD RIGHT ARM Performed at [facility], [address], [place], [place] [address]    Special Requests   Final    BOTTLES DRAWN AEROBIC AND ANAEROBIC Blood Culture adequate volume Performed at [place] [address] [place], [place] [address]    Culture   Final    NO GROWTH 5 DAYS Performed at [facility], [place] [address] [place], [place] [address]    Report  Status 03/07/2019 FINAL  Final  Culture, blood (routine x 2)     Status: None   Collection Time: 03/02/19  7:17 PM   Specimen: BLOOD LEFT ARM  Result Value Ref Range Status   Specimen Description   Final    BLOOD LEFT ARM Performed at [facility], [place] [address], [place], [place] [address]    Special Requests   Final    BOTTLES DRAWN AEROBIC ONLY Blood Culture adequate volume Performed at [place] [address], [place], [place] [address]    Culture   Final    NO GROWTH 5 DAYS Performed at [facility], [place] [address], [place], [place] [address]    Report Status 03/07/2019 FINAL  Final    Studies/Results:  Ct Chest W Contrast  Result Date: 03/08/2019 CLINICAL DATA:  Septic pulmonary emboli; history rheumatic fever, hypertension, drug abuse EXAM: CT CHEST WITH CONTRAST TECHNIQUE: Multidetector CT imaging of the chest was performed during intravenous contrast administration. Sagittal and coronal MPR images reconstructed from axial data set. CONTRAST:  27m OMNIPAQUE IOHEXOL 300 MG/ML  SOLN IV COMPARISON:  02/28/2019 FINDINGS: Cardiovascular: Vascular structures patent. No pericardial effusion. Heart normal size. Pulmonary arteries grossly unremarkable for phase of imaging. Mediastinum/Nodes: Base of cervical region normal appearance. Patient cachectic. No definite adenopathy. Lungs/Pleura: Multiple masses are identified throughout both lungs, many cavitary, consistent with septic emboli. Largest RIGHT lung lesion 2.7 x 2.7 cm RIGHT middle lobe image 123. Largest LEFT lung lesion in lingula 4.7 x 4.5 cm image 101. Dependent atelectasis in the posterior lungs bilaterally. Interval decrease in size of significant LEFT pneumothorax component. Partially loculated LEFT pleural effusion with air-fluid level. Upper Abdomen: Small cyst upper LEFT kidney. Remaining visualized upper abdomen unremarkable. Musculoskeletal: No acute osseous lesions. IMPRESSION: Persistent  loculated LEFT hydropneumothorax, with decrease in pneumothorax component since previous exam. Septic emboli throughout both lungs, little changed. Electronically Signed   By: MLavonia DanaM.D.   On: 03/08/2019 10:37   Dg Chest Port 1 View  Result Date: 03/07/2019 CLINICAL DATA:  Left chest tube in place for pneumothorax. EXAM: PORTABLE CHEST 1 VIEW COMPARISON:  Single-view of the chest 03/04/2019 03/03/2019. CT chest 02/28/2019. History of septic emboli. FINDINGS: Pigtail catheter remains in place in the lower left chest. Left pneumothorax seen on the most recent examination has slightly increased in size. Multiple cavitary nodules are again seen bilaterally. No right pneumothorax. Heart size is normal. IMPRESSION: Small left pneumothorax seen on the most recent examination has mildly increased in size. Left chest tube remains in place. Multiple cavitary nodules bilaterally consistent with septic emboli. Electronically Signed   By: TInge RiseM.D.   On: 03/07/2019 10:33   Vas UKoreaUpper Extremity Venous Duplex  Result Date: 03/07/2019 UPPER VENOUS STUDY  Indications: Pain Comparison Study: no prior Performing Technologist: MAbram SanderRVS  Examination Guidelines: A complete evaluation includes B-mode imaging, spectral Doppler, color Doppler, and power Doppler as needed of all accessible portions of each vessel. Bilateral testing is considered an integral part of a complete examination. Limited examinations for reoccurring indications may be performed as noted.  Right Findings: +----------+------------+---------+-----------+----------+-------+  RIGHT      Compressible Phasicity Spontaneous Properties Summary  +----------+------------+---------+-----------+----------+-------+  Subclavian                 Yes        Yes                         +----------+------------+---------+-----------+----------+-------+  Left Findings: +----------+------------+---------+-----------+----------+-------+  LEFT        Compressible Phasicity Spontaneous Properties Summary  +----------+------------+---------+-----------+----------+-------+  IJV            Full        Yes        Yes                         +----------+------------+---------+-----------+----------+-------+  Subclavian     Full        Yes        Yes                         +----------+------------+---------+-----------+----------+-------+  Axillary  Full        Yes        Yes                         +----------+------------+---------+-----------+----------+-------+  Brachial       Full        Yes        Yes                         +----------+------------+---------+-----------+----------+-------+  Radial         Full                                               +----------+------------+---------+-----------+----------+-------+  Ulnar          Full                                               +----------+------------+---------+-----------+----------+-------+  Cephalic       Full                                               +----------+------------+---------+-----------+----------+-------+  Basilic        Full                                               +----------+------------+---------+-----------+----------+-------+  Summary:  Right: No evidence of thrombosis in the subclavian.  Left: No evidence of deep vein thrombosis in the upper extremity. No evidence of superficial vein thrombosis in the upper extremity. No evidence of thrombosis in the subclavian.  *See table(s) above for measurements and observations.  Diagnosing physician: Harold Barban MD Electronically signed by Harold Barban MD on 03/07/2019 at 5:52:52 PM.    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 He has septic embolization to the lungs with cavitary pathology and now loculated pneumothorax concerning for empyema  he also has a pulmonary embolism. He has BP fistula.  He is sp Chest TUBE placement He does also have a small pericardial effusion.  Repeat TTE shows RV vegetation that I had not seen in the TEE read from last week and smaller pericardial effusion  #1 Metastatic MSSAB as described:       [place] Antimicrobial Management Team Staphylococcus aureus bacteremia   Staphylococcus aureus bacteremia (SAB) is associated with a high rate of complications and mortality.  Specific aspects of clinical management are critical to optimizing the outcome of patients with SAB.  Therefore, the [facility] Antimicrobial Management Team [facility]) has initiated an intervention aimed at improving the management of SAB at [facility].  To do so, Infectious Diseases physicians are providing an evidence-based consult for the management of all patients with SAB.     Yes No Comments  Perform follow-up blood cultures (even if the patient is afebrile) to ensure clearance of bacteremia _0  _1  Repeated post central line re and removal and NG FINAL  Remove vascular catheter and obtain follow-up blood cultures after the removal  of the catheter _2  _3  coud have PICC IF he is going to stay here for all of his IV antibiiotics  Perform echocardiography to evaluate for endocarditis (transthoracic ECHO is 40-50% sensitive, TEE is > 90% sensitive) _4  _5  TEE was NOT negative but in fact + RVentricular lesion that is likely a vegetation + pericardial effusion  Consult electrophysiologist to evaluate implanted cardiac device (pacemaker, ICD) _6  _7    Ensure source control _8  _9  Need to monitor Neuro exam, CT tube in place and seen by Dr. Gennie Alma who wants to try current therapy rather than hazard thoracotomy at this point  Investigate for metastatic sites of infection _10  _11  Does the patient have ANY symptom or physical exam finding that would suggest a deeper infection (back or neck pain that may be suggestive of vertebral osteomyelitis or epidural abscess, muscle pain that could be a symptom of pyomyositis)?  Keep in mind that for deep seeded infections MRI imaging with contrast is preferred rather than other often insensitive tests such as plain x-rays, especially early in a patient's presentation.  Change antibiotic therapy to cefazolin _12  _13  Beta-lactam antibiotics are preferred for MSSA due to higher cure rates.   If on Vancomycin, goal trough should be 15 - 20 mcg/mL  Estimated duration of IV antibiotic therapy:  6-8 weeks but would need to be int he hospital _14  _15  Consult case management for probably prolonged outpatient IV antibiotic therapy   #2 RVentricular vegeation  Endocarditis Evaluation   Infective endocarditis (IE) is associated with a high rate of complications and mortality.  Specific aspects of clinical management are critical to optimizing the outcome of patients with IE. Therefore, the Pharmacy Residency Program at [facility] has initiated an evidence-based consult aimed at improving the management of IE at [facility].     Comments  Consult to ID Outpatient follow-up appointment to be made prior to  discharge.  Utilization of ID recommended antibiotic therapy   Surgery Evaluation Cardiothoracic surgery consult if the following indications are present:  - Heart failure associated to valve dysfunction - Endocarditis complicated by heart block or aortic abscess - Left-sided endocarditis caused by S. aureus, fungal, or multi-drug resistant organisms - Left-sided endocarditis with severe valve dysfunction - Large vegetation (>1 cm) on aortic or mitral valve - Persistence of infection 5-7 days after initiation of appropriate antibiotic therapy - Recurrent emboli and persistent vegetations  despite appropriate antibiotic therapy - Any history with left-sided embolization with persistent vegetations on TEE  Consult electrophysiologist if presence of implanted cardiac device (pacemaker, ICD)   Consult to cardiology if left ventricular ejection fraction is <40% Evaluation for appropriate heart failure medications upon discharge: - ACE/ARB - Diuretic - Beta blocker  Outpatient follow-up appointment to be made prior to discharge.   Based on the ID MD's evaluation of this patient, the following are recommended to complete a thorough evaluation and care plan to reflect guideline-recommended therapy = ID appt to be maade     #3  Lumbar diskitis/vertebral osteomyelitis: monitor Neuro exam and continue antibiotics  #4  Loculated effusion with pneumothorax and BP fistula  Continue close monitoring  Dr. 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   I will chart out plan of 6 weeks of IV antibiotics in the hospital. He will assuredly need to be here as well to continue with his Chest tube, improve nutrition etc.   I will make an appt for him with me to time it when he will have finished his  IV antibiotics.   LOS: 14 days   Alcide Evener 03/08/2019, 4:23 PM

## 2019-03-08 NOTE — Progress Notes (Signed)
PT Cancellation Note  Patient Details Name: Franklin Anderson MRN: [id number] DOB: 1970/09/16   Cancelled Treatment:     pt declined any OOB activity due to lower back/sacrum pain.  "It's intense".  Will continue to attempt to see.     Rica Koyanagi  PTA Acute  Rehabilitation Services Pager      [phone] Office      [phone]

## 2019-03-08 NOTE — Progress Notes (Signed)
..   NAME:  Franklin Anderson, MRN:  [id number], DOB:  1971/04/03, LOS: 33 ADMISSION DATE:  02/21/2019, CONSULTATION DATE:  02/28/2019 REFERRING MD:  Earnest Conroy MD, CHIEF COMPLAINT:  SOB   Brief History   48 yr old M with PMHx of rheumatic fever, HTN, IV heroin abuse presented on 10/27 w/ back pain and lower ext weakness. 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 Recent TEE shows mobile mass moderate in size located in the mid/ apical  RV that appears to be attached to a trabeculation. NP overnight evaluated patient at shift change noted to be tachypneic. New CXR shows left sided loculated pneumothorax. PCCM consulted    Past Medical History  .Marland Kitchen Active Ambulatory Problems    Diagnosis Date Noted  . Shortness of breath 10/01/2015  . Dyspnea 10/01/2015  . HTN (hypertension) 10/01/2015  . 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 Heroin use 10/01/2015  . Tobacco abuse 10/01/2015  . Drug abuse ([place])   . Hypertension 04/27/2010  . H/O: rheumatic fever    Resolved Ambulatory Problems    Diagnosis Date Noted  . No Resolved Ambulatory Problems   No Additional Past Medical History     Significant Hospital Events    10/27 with worsening back pain and BLE weakness.  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 I nitially evaluated by PCCM on 10/28 when pt became obtunded and dyspneic. Pt was endotracheally intubated for acute hypoxemic respiratory failure. He received a Left IJ CVC on 10/28 with follow up CXR showing no complications. He received IV Antibiotics for metastatic MSSAB infection, initially on Vancomycin and Cefepime tapered to Ancef.  He was evaluated with bedside echo and found to have pericardial effusion without tamponade. Formal TTE showed G!DD w EF 60% and possible mitral valve vegetation.  Pt received TEE on 11/3 at [facility] (w/o intubation, tolerated procedure well) and was found to have  mobile mass moderate in size located in the mid/  apical  RV that appears to be attached to a trabeculation.  11/3: NP overnight evaluated patient at shift change noted the pt to be tachypneic. Pt reported to NP that he felt more SOB than baseline not not in acute distress with no significant desaturations. A New CXR was completed: results were called to [facility] by NP.  It shows a new left sided loculated pneumothorax.  11/4: Spoke w/ Dr Prescott Gum via phone who reviewed imaging and recommended chest tube placement. Chest tube placed, marked improvement in pneumothorax 11/6: Pleural fluid volume much improved.  Ongoing 1 out of 7 airleak.  Suction from pleural tube increased to 30.  Awaiting cultures, currently has gram-positive cocci in clusters 11/9 still w/ airleak. Got symptomatic (more short of breath) when chest tube sxn was not functioning  11/10 on going BPF. Asked for formal thoracic eval. Concerned that we won't be able to get CT out.  Consults:  10/28>>Infectious Disease 10/28>>PCCM 10/29>> Cardiology 11/3>>> PCCM re-consult 11/4: Phone consult with thoracic surgery Procedures:  10/28>>CT guided aspiration of right psoas abscesses 10/28>>Endotracheally intubated 10/28>> LIJ CVC placement 10/28>>Bronchoscopy with BAL 10/31>>Extubated to 3L [place] by RT 11/3>>> Transesophageal Echo 11/4: Left-sided chest tube   Micro Data:  Bllood cx x 2 on 10/27>>> + MSSA Blood cx  On 10/28>>>>>>+ MSSA Abscess Rt Psoas>>MODERATE STAPHYLOCOCCUS AUREUS 10/28 resp cx on 10/28>>MODERATE STAPHYLOCOCCUS AUREUS  Repeat Blood cx on 10/30>>NGTD (4 days) Pleural fluid 11/4>>>MSSA Antimicrobials:  Abx course--Cefepime10/28 >>10/29 ,Vanc 10/28 >>10/30 Cefazolin 10/30>>  Subjective   No distress today  Objective   Blood pressure (Abnormal) 138/103, pulse (Abnormal) 101, temperature 98.4 F (36.9 C), temperature source Oral, resp. rate 16, height 6' (1.829 m), weight 55 kg, SpO2 96 %.        Intake/Output Summary (Last 24 hours) at 03/08/2019  0950 Last data filed at 03/08/2019 0910 Gross per 24 hour  Intake 2029.34 ml  Output 4100 ml  Net -2070.66 ml   Filed Weights   03/06/19 0318 03/07/19 0500 03/08/19 0500  Weight: 58.9 kg 56.9 kg 55 kg    Examination: General this 48 year old male patient resting in bed he is in no acute distress today HEENT normocephalic does have some temporal wasting mucosa moist no JVD Pulmonary: Bronchopleural fistula rhonchorous breath sounds on the left, diminished in the base.  There is a 1 out of 7 airleak Cardiac: Regular rate and rhythm without murmur rub or gallop Abdomen: Soft nontender Extremities warm and dry.  Pedal edema present. Neuro: Awake oriented no focal deficits.  Resolved Hospital Problem list   Uremia and Hypernatremia resolving AKI Extubated on 10/31 Blood cx on 10/ Hypernatremia   Assessment & Plan:   Cavitary PNA w/  Numerous rounded and cavitary fluid-filled lesions in the left lung Left Hydropneumothorax concerning for empyema BPF Septic VTE Pulmonary emboli  MSSA bacteremia/endocarditis w/ mobile mass in the RV further c/b : Discitis, osteomyelitis, bilateral psoas muscle abscesses, septic emboli of lungs and cavitary PNA also small pericardial effusion HTN Cirrhosis with ascites Normocytic Anemia Intravascular hemolytic process>>Schistocytes on smear   pulm problem Discussion MSSA cavitary pneumonia secondary to septic pulmonary emboli, and subsequent  empyema and what bronchopleural fistula S/p ultrasound-guided tube thoracostomy on 11/4 Portable chest x-ray reviewed from this morning continues to show left upper lobe lucency.  There are continues to be numerous bilateral septic emboli this is not changed.  The left upper lobe pneumothorax looks unchanged.  Chest tube is in satisfactory position -Still has 1 out of 7 airleak and is draining yellow purulent discharge  Plan/recommendation Awaiting f/u CT scan Will need long term abx Cont CT to suction  This will need to be long term (anticipate several weeks) rx malnutrition  Address immobility He is going to need LTAC     Critical care time:NA     Erick Colace ACNP-BC [place] Pager # [phone] OR # [phone] if no answer

## 2019-03-08 NOTE — Progress Notes (Signed)
PROGRESS NOTE    Franklin Anderson  DZH:[id number] DOB: [birth year] DOA: 02/21/2019 PCP: Julieanne Manson, MD   Brief Narrative:  48 y.o.malewith medical history significant of rheumatic fever, HTN, ongoingIV heroin abuse, presented to the ED on 10/27 with 3 week course of progressively worsening back pain and BLE weakness associated with some bowel incontinence and trouble walking. Last use of heroin was on the day of admission. He was found to be septic with MSSA bacteremia, endocarditis, osteomyelitis, discitis at L4 and L5, septic pulmonary emboli with loculated effusion and pneumothorax and psoas abscesses. On 10/28 was found to be obtunded and dyspneic and was intubated by critical care, extubated on 10/31.  Right psoas abscess was aspirated on 10/28, wound care > MSSA.  Respiratory culture from 10/28 also grew MSSA. 11/3: Patient underwent TEE and had acute respiratory distress later that evening with stat chest x-ray showing loculated pneumothorax/CT chest indicating empyema/PE/hydropneumothorax. Now seen by PCCM and post chest tube placement.Code status changed to DNR.  Subjective: Patient just came back from CT chest and was complaining of left-sided chest pain due to transfer from bed to CT scan.  Assessment & Plan:   Principal Problem:   Sepsis (HCC) Active Problems:   Dyspnea   Heroin use   Endocarditis   Septic pulmonary embolism (HCC)   Osteomyelitis of thoracic spine (HCC)   Osteomyelitis of lumbar spine (HCC)   Acute encephalopathy   AKI (acute kidney injury) (HCC)   Transaminitis   Psoas muscle abscess (HCC)   Pressure injury of skin   Mucus clot in bronchi   Protein-calorie malnutrition, severe   Abnormal echocardiogram   MSSA bacteremia   Acute pulmonary embolism (HCC)   Loculated pleural effusion   Empyema, left (HCC)   MSSA bacteremia/sepsis with discitis/vertebral osteomyelitis/psoas abscess/septic pulmonary emboli/cavitary lesion, empyema, mural  vegetation.  Repeat CT chest today shows persistent loculated left hydropneumothorax with decreased in pneumothorax component.  Multiple septic emboli throughout both lungs remained unchanged. ID is following-appreciate the recommendations. CT surgery does not think that he is a good candidate for thoracotomy and muscle flap closure, plan is to continue with pigtail empyema tube for a long time. -Continue Ancef for now.  Acute hypoxic respiratory failure.  Due to sepsis and multiple pulmonary emboli.  Patient was extubated on 10/31.  No current respiratory distress. -PCCM is following-we appreciate their recommendations.  Psoas abscess S/P CT-guided right psoas aspiration by IR on 10/28 yielding 18 mL of purulent fluid, cultures grew MSSA -Pain is better controlled after starting long-acting OxyContin, continue as needed Dilaudid  Pericardial effusion.  Noted on previous echo.  Resolved on CT scan done today.  Acute metabolic encephalopathy.  Resolved  Hypernatremia.  Resolved  AKI.  Resolved  Normocytic anemia.  Hemoglobin stable around 10.6.  Hypertension.  Blood pressure mildly elevated today. -Continue metoprolol, Norvasc and hydralazine IV as needed with parameters.  Pressure injury Sacrum with stage I, wound care per nursing  Severe protein calorie malnutrition, hypoalbuminemia, generalized deconditioning -Albumin level 1.7 today. -Nutritionist consult.  Objective: Vitals:   03/08/19 0500 03/08/19 0530 03/08/19 1047 03/08/19 1342  BP:  (!) 138/103 115/83 (!) 143/101  Pulse:  (!) 101 (!) 114 (!) 113  Resp:  16 16 18   Temp:    99.8 F (37.7 C)  TempSrc:    Oral  SpO2:  96% 96% 95%  Weight: 55 kg     Height:        Intake/Output Summary (Last 24 hours) at 03/08/2019  [place] filed at 03/08/2019 1254 Gross per 24 hour  Intake 2029.34 ml  Output 3550 ml  Net -1520.66 ml   Filed Weights   03/06/19 0318 03/07/19 0500 03/08/19 0500  Weight: 58.9 kg 56.9 kg  55 kg    Examination:  General exam: Appears calm and comfortable  Respiratory system: Scattered rhonchi on left with decreased breath sounds at left base. Cardiovascular system: Mild sinus tachycardia. No JVD, murmurs, rubs, gallops or clicks. 1+ pedal edema. Gastrointestinal system: Abdomen is nondistended, soft and nontender. No organomegaly or masses felt. Normal bowel sounds heard. Central nervous system: Alert and oriented. No focal neurological deficits. Extremities: Symmetric 5 x 5 power. Skin: No rashes, lesions or ulcers Psychiatry: Judgement and insight appear normal. Mood & affect appropriate.   DVT prophylaxis: Heparin Code Status: DNR Family Communication: Discussed with patient, no family at bedside. Disposition Plan: Pending improvement.  Patient does not qualify for LTAC due to insurance issues.  Will need placement for long-term IV antibiotics.  Consultants:  Cardiology CCM Infectious disease Palliative care Cardiothoracic surgery.  Antimicrobials: Currently on Ancef.  Data Reviewed: I have personally reviewed following labs and imaging studies  CBC: Recent Labs  Lab 03/02/19 0500 03/04/19 0527 03/05/19 0233 03/08/19 0610  WBC 7.9 9.0 10.5 8.1  HGB 7.7* 8.1* 10.8* 10.6*  HCT 25.0* 26.1* 34.2* 33.5*  MCV 90.9 92.2 91.9 92.0  PLT 170 205 116* 720   Basic Metabolic Panel: Recent Labs  Lab 03/02/19 0500 03/03/19 1218 03/05/19 0233 03/08/19 0610  NA 138 136 136 134*  K 3.9 3.4* 3.9 4.2  CL 105 107 105 97*  CO2 25 23 24 28   GLUCOSE 106* 137* 158* 111*  BUN 27* 25* 16 11  CREATININE 0.47* 0.44* 0.43* 0.39*  CALCIUM 7.7* 7.4* 7.4* 8.2*   GFR: Estimated Creatinine Clearance: 87.8 mL/min (A) (by C-G formula based on SCr of 0.39 mg/dL (L)).   Liver Function Tests: Recent Labs  Lab 03/08/19 0610  AST 73*  ALT 42  ALKPHOS 92  BILITOT 0.4  PROT 6.5  ALBUMIN 1.7*   No results for input(s): LIPASE, AMYLASE in the last 168 hours. No results  for input(s): AMMONIA in the last 168 hours. Coagulation Profile: No results for input(s): INR, PROTIME in the last 168 hours. Cardiac Enzymes: No results for input(s): CKTOTAL, CKMB, CKMBINDEX, TROPONINI in the last 168 hours. BNP (last 3 results) No results for input(s): PROBNP in the last 8760 hours. HbA1C: No results for input(s): HGBA1C in the last 72 hours. CBG: Recent Labs  Lab 03/07/19 1138 03/07/19 1654 03/07/19 2019 03/08/19 0752 03/08/19 1156  GLUCAP 171* 158* 114* 103* 111*   Lipid Profile: No results for input(s): CHOL, HDL, LDLCALC, TRIG, CHOLHDL, LDLDIRECT in the last 72 hours. Thyroid Function Tests: No results for input(s): TSH, T4TOTAL, FREET4, T3FREE, THYROIDAB in the last 72 hours. Anemia Panel: No results for input(s): VITAMINB12, FOLATE, FERRITIN, TIBC, IRON, RETICCTPCT in the last 72 hours. Sepsis Labs: No results for input(s): PROCALCITON, LATICACIDVEN in the last 168 hours.  Recent Results (from the past 240 hour(s))  Body fluid culture     Status: None   Collection Time: 03/01/19  1:30 PM   Specimen: Chest; Body Fluid  Result Value Ref Range Status   Specimen Description   Final    CHEST Performed at [facility] [address] [place], [place] [address]    Special Requests   Final    Normal Performed at [facility], [address] [place], [place] [address]    Gram Stain   Final    MODERATE WBC PRESENT, PREDOMINANTLY PMN FEW GRAM POSITIVE COCCI IN CLUSTERS Performed at [facility], [address] [place], [place] [address]    Culture RARE STAPHYLOCOCCUS AUREUS  Final   Report Status 03/04/2019 FINAL  Final   Organism ID, Bacteria STAPHYLOCOCCUS AUREUS  Final      Susceptibility   Staphylococcus aureus - MIC*    CIPROFLOXACIN <=0.5 SENSITIVE Sensitive     ERYTHROMYCIN >=8 RESISTANT Resistant     GENTAMICIN <=0.5 SENSITIVE Sensitive     OXACILLIN 0.5 SENSITIVE Sensitive     TETRACYCLINE  <=1 SENSITIVE Sensitive     VANCOMYCIN <=0.5 SENSITIVE Sensitive     TRIMETH/SULFA <=10 SENSITIVE Sensitive     CLINDAMYCIN <=0.25 SENSITIVE Sensitive     RIFAMPIN <=0.5 SENSITIVE Sensitive     Inducible Clindamycin NEGATIVE Sensitive     * RARE STAPHYLOCOCCUS AUREUS  Culture, blood (routine x 2)     Status: None   Collection Time: 03/02/19  7:10 PM   Specimen: BLOOD RIGHT ARM  Result Value Ref Range Status   Specimen Description   Final    BLOOD RIGHT ARM Performed at [facility], [address], [place], [place] [address]    Special Requests   Final    BOTTLES DRAWN AEROBIC AND ANAEROBIC Blood Culture adequate volume Performed at [facility], [address], [place], [place] [address]    Culture   Final    NO GROWTH 5 DAYS Performed at [facility], [address], [place], [place] [address]    Report Status 03/07/2019 FINAL  Final  Culture, blood (routine x 2)     Status: None   Collection Time: 03/02/19  7:17 PM   Specimen: BLOOD LEFT ARM  Result Value Ref Range Status   Specimen Description   Final    BLOOD LEFT ARM Performed at [facility], [address], [place], [place] [address]    Special Requests   Final    BOTTLES DRAWN AEROBIC ONLY Blood Culture adequate volume Performed at [facility], [address], [place], [place] [address]    Culture   Final    NO GROWTH 5 DAYS Performed at [facility], [address] [place], [place] [address]    Report Status 03/07/2019 FINAL  Final      Radiology Studies: Ct Chest W Contrast  Result Date: 03/08/2019 CLINICAL DATA:  Septic pulmonary emboli; history rheumatic fever, hypertension, drug abuse EXAM: CT CHEST WITH CONTRAST TECHNIQUE: Multidetector CT imaging of the chest was performed during intravenous contrast administration. Sagittal and coronal MPR images reconstructed from axial data set. CONTRAST:  75mL OMNIPAQUE IOHEXOL 300 MG/ML  SOLN IV  COMPARISON:  02/28/2019 FINDINGS: Cardiovascular: Vascular structures patent. No pericardial effusion. Heart normal size. Pulmonary arteries grossly unremarkable for phase of imaging. Mediastinum/Nodes: Base of cervical region normal appearance. Patient cachectic. No definite adenopathy. Lungs/Pleura: Multiple masses are identified throughout both lungs, many cavitary, consistent with septic emboli. Largest RIGHT lung lesion 2.7 x 2.7 cm RIGHT middle lobe image 123. Largest LEFT lung lesion in lingula 4.7 x 4.5 cm image 101. Dependent atelectasis in the posterior lungs bilaterally. Interval decrease in size of significant LEFT pneumothorax component. Partially loculated LEFT pleural effusion with air-fluid level. Upper Abdomen: Small cyst upper LEFT kidney. Remaining visualized upper abdomen unremarkable. Musculoskeletal:  No acute osseous lesions. IMPRESSION: Persistent loculated LEFT hydropneumothorax, with decrease in pneumothorax component since previous exam. Septic emboli throughout both lungs, little changed. Electronically Signed   By: Ulyses SouthwardMark  Boles M.D.   On: 03/08/2019 10:37   Dg Chest Port 1 View  Result Date: 03/07/2019 CLINICAL DATA:  Left chest tube in place for pneumothorax. EXAM: PORTABLE CHEST 1 VIEW COMPARISON:  Single-view of the chest 03/04/2019 03/03/2019. CT chest 02/28/2019. History of septic emboli. FINDINGS: Pigtail catheter remains in place in the lower left chest. Left pneumothorax seen on the most recent examination has slightly increased in size. Multiple cavitary nodules are again seen bilaterally. No right pneumothorax. Heart size is normal. IMPRESSION: Small left pneumothorax seen on the most recent examination has mildly increased in size. Left chest tube remains in place. Multiple cavitary nodules bilaterally consistent with septic emboli. Electronically Signed   By: Drusilla Kannerhomas  Dalessio M.D.   On: 03/07/2019 10:33   Vas [place] Upper Extremity Venous Duplex  Result Date:  03/07/2019 UPPER VENOUS STUDY  Indications: Pain Comparison Study: no prior Performing Technologist: Blanch MediaMegan Riddle RVS  Examination Guidelines: A complete evaluation includes B-mode imaging, spectral Doppler, color Doppler, and power Doppler as needed of all accessible portions of each vessel. Bilateral testing is considered an integral part of a complete examination. Limited examinations for reoccurring indications may be performed as noted.  Right Findings: +----------+------------+---------+-----------+----------+-------+  RIGHT      Compressible Phasicity Spontaneous Properties Summary  +----------+------------+---------+-----------+----------+-------+  Subclavian                 Yes        Yes                         +----------+------------+---------+-----------+----------+-------+  Left Findings: +----------+------------+---------+-----------+----------+-------+  LEFT       Compressible Phasicity Spontaneous Properties Summary  +----------+------------+---------+-----------+----------+-------+  IJV            Full        Yes        Yes                         +----------+------------+---------+-----------+----------+-------+  Subclavian     Full        Yes        Yes                         +----------+------------+---------+-----------+----------+-------+  Axillary       Full        Yes        Yes                         +----------+------------+---------+-----------+----------+-------+  Brachial       Full        Yes        Yes                         +----------+------------+---------+-----------+----------+-------+  Radial         Full                                               +----------+------------+---------+-----------+----------+-------+  Ulnar          Full                                               +----------+------------+---------+-----------+----------+-------+  Cephalic       Full                                                +----------+------------+---------+-----------+----------+-------+  Basilic        Full                                               +----------+------------+---------+-----------+----------+-------+  Summary:  Right: No evidence of thrombosis in the subclavian.  Left: No evidence of deep vein thrombosis in the upper extremity. No evidence of superficial vein thrombosis in the upper extremity. No evidence of thrombosis in the subclavian.  *See table(s) above for measurements and observations.  Diagnosing physician: Coral Else MD Electronically signed by Coral Else MD on 03/07/2019 at 5:52:52 PM.    Final     Scheduled Meds:  amLODipine  10 mg Oral Daily   Chlorhexidine Gluconate Cloth  6 each Topical Daily   feeding supplement (ENSURE ENLIVE)  237 mL Oral BID BM   feeding supplement (PRO-STAT SUGAR FREE 64)  30 mL Oral BID   insulin aspart  0-24 Units Subcutaneous TID AC & HS   mouth rinse  15 mL Mouth Rinse BID   metoprolol tartrate  50 mg Oral BID   multivitamin with minerals  1 tablet Oral Daily   oxyCODONE  10 mg Oral Q12H   pantoprazole  40 mg Oral Daily   sodium chloride (PF)       sodium chloride flush  10-40 mL Intracatheter Q12H   Continuous Infusions:  sodium chloride 250 mL (03/07/19 2139)    ceFAZolin (ANCEF) IV 2 g (03/08/19 0525)   sodium chloride       LOS: 14 days   Time spent: 50 minutes.  I extensively reviewed his chart and notes from his different consultations.  Arnetha Courser, MD [facility] Pager [phone]  If 7PM-7AM, please contact night-coverage [email or web address] Password [email or web address] 03/08/2019, 3:01 PM

## 2019-03-09 DIAGNOSIS — L988 Other specified disorders of the skin and subcutaneous tissue: Secondary | ICD-10-CM

## 2019-03-09 DIAGNOSIS — Z880 Allergy status to penicillin: Secondary | ICD-10-CM

## 2019-03-09 LAB — CBC
HCT: 28.3 % — ABNORMAL LOW (ref 39.0–52.0)
Hemoglobin: 9 g/dL — ABNORMAL LOW (ref 13.0–17.0)
MCH: 29.3 pg (ref 26.0–34.0)
MCHC: 31.8 g/dL (ref 30.0–36.0)
MCV: 92.2 fL (ref 80.0–100.0)
Platelets: 349 10*3/uL (ref 150–400)
RBC: 3.07 MIL/uL — ABNORMAL LOW (ref 4.22–5.81)
RDW: 17.9 % — ABNORMAL HIGH (ref 11.5–15.5)
WBC: 7.2 10*3/uL (ref 4.0–10.5)
nRBC: 0 % (ref 0.0–0.2)

## 2019-03-09 LAB — GLUCOSE, CAPILLARY
Glucose-Capillary: 122 mg/dL — ABNORMAL HIGH (ref 70–99)
Glucose-Capillary: 140 mg/dL — ABNORMAL HIGH (ref 70–99)
Glucose-Capillary: 129 mg/dL — ABNORMAL HIGH (ref 70–99)
Glucose-Capillary: 162 mg/dL — ABNORMAL HIGH (ref 70–99)

## 2019-03-09 MED ORDER — PRO-STAT SUGAR FREE PO LIQD
30.0000 mL | Freq: Three times a day (TID) | ORAL | Status: DC
  Administered 2019-03-09 – 2019-03-20 (×13): 30 mL via ORAL
  Filled 2019-03-09 (×25): qty 30

## 2019-03-09 MED ORDER — HEPATITIS B VAC RECOMBINANT 10 MCG/0.5ML IJ SUSP
0.5000 mL | Freq: Once | INTRAMUSCULAR | Status: AC
  Administered 2019-03-09: 0.5 mL via INTRAMUSCULAR
  Filled 2019-03-09: qty 0.5

## 2019-03-09 NOTE — Progress Notes (Addendum)
Diagnosis: Metastatic MSSA B including diskitis, empyema, BP fistula, endocarditis with septic emboli  Culture Result: MSSA  Allergies  Allergen Reactions  . Penicillins Other (See Comments)      Cefazolin 2 g IV q 8 hours  Duration:  6 weeks minimum  End Date:  December 16th, 2020  [place] Per Protocol:   Labs  weekly while on IV antibiotics: _x_ CBC with differential _x_ BMP w GFR/CMP _x_ CRP _x_ ESR   _x_ Please pull PIC at completion of IV antibiotics __ Please leave PIC in place until doctor has seen patient or been notified   Clinic Follow Up Appt:   Franklin Anderson has an appointment on 05/01/2019 with Dr. Tommy Anderson at [facility] for Infectious Disease is located in the [facility] at  [place] in [place].  [address], which is located to the left of the elevators.  Phone: [phone]  Fax: [phone]  [email or web address]  He should arrive 15 minutes prior to his visit  NOTE PLEASE CALL us BACK TO SEE THE PATIENT AS HE NEARS COMPLETION OF HIS IV ANTIBIOTICS BECAUSE I WOULD INTEND TO TREAT HIM WITH ADDITIONAL ORAL ANTIOTICS ([place]) AND WOULD WANT HIM TO [place] WITH 30 DAY SUPPLY OF KEFLEX WHEN FINISHED W IV ABX.

## 2019-03-09 NOTE — Progress Notes (Signed)
PROGRESS NOTE    Franklin Anderson  CHE:[id number] DOB: [birth year] DOA: 02/21/2019 PCP: Mack Hook, MD   Brief Narrative:  48 y.o.malewith medical history significant of rheumatic fever, HTN, ongoingIV heroin abuse, presented to the ED on 10/27 with 3 week course of progressively worsening back pain and BLE weakness associated with some bowel incontinence and trouble walking. Last use of heroin was on the day of admission. He was found to be septic with MSSA bacteremia, endocarditis, osteomyelitis, discitis at L4 and L5, septic pulmonary emboli with loculated effusion and pneumothorax and psoas abscesses. On 10/28 was found to be obtunded and dyspneic and was intubated by critical care, extubated on 10/31. Right psoas abscess was aspirated on 10/28, wound care >MSSA. Respiratory culture from 10/28 also grew MSSA. 11/3: Patient underwent TEE and had acute respiratory distress later that evening with stat chest x-ray showing loculated pneumothorax/CT chest indicating empyema/PE/hydropneumothorax. Now seen by PCCM and post chest tube placement.Code status changed to DNR.  Subjective: Patient was feeling better when seen this morning.  His pain and breathing is improving.  Assessment & Plan:   Principal Problem:   Sepsis ([place]) Active Problems:   Dyspnea   Heroin use   Endocarditis   Septic pulmonary embolism (HCC)   Osteomyelitis of thoracic spine (HCC)   Osteomyelitis of lumbar spine (HCC)   Acute encephalopathy   AKI (acute kidney injury) ([place])   Transaminitis   Psoas muscle abscess (HCC)   Pressure injury of skin   Mucus clot in bronchi   Protein-calorie malnutrition, severe   Abnormal echocardiogram   MSSA bacteremia   Acute pulmonary embolism (HCC)   Pleural effusion on left   Empyema, left (HCC)   Chest tube in place   Pain   Pneumothorax   Fistula  MSSA bacteremia/sepsis with discitis/vertebral osteomyelitis/psoas abscess/septic pulmonary emboli/cavitary  lesion, empyema, mural vegetation.  Repeat CT chest today shows persistent loculated left hydropneumothorax with decreased in pneumothorax component.  Multiple septic emboli throughout both lungs remained unchanged. ID is following-appreciate the recommendations. CT surgery does not think that he is a good candidate for thoracotomy and muscle flap closure, plan is to continue with pigtail empyema tube for a long time. -Continue Ancef for now.  By Dr. Lucianne Lei dam patient should complete 6 weeks of Ancef in hospital and then discharged home on Keflex for another 30 days.  Acute hypoxic respiratory failure.  Due to sepsis and multiple pulmonary emboli.  Patient was extubated on 10/31.  No current respiratory distress. -PCCM is following-we appreciate their recommendations.  Psoas abscess S/P CT-guided right psoas aspiration by IR on 10/28 yielding 18 mL of purulent fluid, cultures grew MSSA -Pain is better controlled after starting long-acting OxyContin, continue as needed Dilaudid  Pericardial effusion.  Noted on previous echo.  Resolved on CT scan done today.  Acute metabolic encephalopathy.  Resolved  Hypernatremia.  Resolved  AKI.  Resolved  Normocytic anemia.  Hemoglobin dropped to 9 today.  No obvious bleeding. -Continue to monitor  Hypertension.  Blood pressure mildly elevated. -Continue metoprolol, Norvasc and hydralazine IV as needed with parameters.  Pressure injury Sacrum with stage I, wound care per nursing  Severe protein calorie malnutrition, hypoalbuminemia, generalized deconditioning -Albumin level 1.7 today. -Nutritionist consult.  Objective: Vitals:   03/08/19 2159 03/09/19 0427 03/09/19 1046 03/09/19 1347  BP: (!) 145/100 (!) 141/105 (!) 122/91 (!) 134/91  Pulse: (!) 117 (!) 101 (!) 121 (!) 116  Resp: 20 18  18   Temp: 100.2 F (37.9  C) 98.1 F (36.7 C)  98 F (36.7 C)  TempSrc: Oral Oral  Oral  SpO2: 94% 95%  99%  Weight:      Height:         Intake/Output Summary (Last 24 hours) at 03/09/2019 1725 Last data filed at 03/09/2019 1600 Gross per 24 hour  Intake 639.86 ml  Output 2650 ml  Net -2010.14 ml   Filed Weights   03/06/19 0318 03/07/19 0500 03/08/19 0500  Weight: 58.9 kg 56.9 kg 55 kg    Examination:  General exam: Appears calm and comfortable  Respiratory system: Scattered rhonchi with decreased breath sounds at bases.  Respiratory effort normal. Cardiovascular system: S1 & S2 heard, RRR. No JVD, murmurs, rubs, gallops or clicks. No pedal edema. Gastrointestinal system: Abdomen is nondistended, soft and nontender. No organomegaly or masses felt. Normal bowel sounds heard. Central nervous system: Alert and oriented. No focal neurological deficits. Extremities: Symmetric 5 x 5 power. Skin: No rashes, lesions or ulcers Psychiatry: Judgement and insight appear normal. Mood & affect appropriate.   DVT prophylaxis: Heparin Code Status: DNR  family Communication: No family at bedside. Disposition Plan: Pending improvement and completion of IV antibiotics.  Consultants:  Cardiology CCM Infectious disease Palliative care Cardiothoracic surgery.  Procedures:   Antimicrobials:  Ancef.  Data Reviewed: I have personally reviewed following labs and imaging studies  CBC: Recent Labs  Lab 03/04/19 0527 03/05/19 0233 03/08/19 0610 03/09/19 0916  WBC 9.0 10.5 8.1 7.2  HGB 8.1* 10.8* 10.6* 9.0*  HCT 26.1* 34.2* 33.5* 28.3*  MCV 92.2 91.9 92.0 92.2  PLT 205 116* 304 349   Basic Metabolic Panel: Recent Labs  Lab 03/03/19 1218 03/05/19 0233 03/08/19 0610  NA 136 136 134*  K 3.4* 3.9 4.2  CL 107 105 97*  CO2 GLUCOSE 137* 158* 111*  BUN 25* 16 11  CREATININE 0.44* 0.43* 0.39*  CALCIUM 7.4* 7.4* 8.2*   GFR: Estimated Creatinine Clearance: 87.8 mL/min (A) (by C-G formula based on SCr of 0.39 mg/dL (L)). Liver Function Tests: Recent Labs  Lab 03/08/19 0610  AST 73*  ALT 42  ALKPHOS 92   BILITOT 0.4  PROT 6.5  ALBUMIN 1.7*   No results for input(s): LIPASE, AMYLASE in the last 168 hours. No results for input(s): AMMONIA in the last 168 hours. Coagulation Profile: No results for input(s): INR, PROTIME in the last 168 hours. Cardiac Enzymes: No results for input(s): CKTOTAL, CKMB, CKMBINDEX, TROPONINI in the last 168 hours. BNP (last 3 results) No results for input(s): PROBNP in the last 8760 hours. HbA1C: No results for input(s): HGBA1C in the last 72 hours. CBG: Recent Labs  Lab 03/08/19 1700 03/08/19 2156 03/09/19 0752 03/09/19 1151 03/09/19 1703  GLUCAP 128* 117* 122* 140* 129*   Lipid Profile: No results for input(s): CHOL, HDL, LDLCALC, TRIG, CHOLHDL, LDLDIRECT in the last 72 hours. Thyroid Function Tests: No results for input(s): TSH, T4TOTAL, FREET4, T3FREE, THYROIDAB in the last 72 hours. Anemia Panel: No results for input(s): VITAMINB12, FOLATE, FERRITIN, TIBC, IRON, RETICCTPCT in the last 72 hours. Sepsis Labs: No results for input(s): PROCALCITON, LATICACIDVEN in the last 168 hours.  Recent Results (from the past 240 hour(s))  Body fluid culture     Status: None   Collection Time: 03/01/19  1:30 PM   Specimen: Chest; Body Fluid  Result Value Ref Range Status   Specimen Description   Final    CHEST Performed at [facility], [address] [place], [place] [address]    Special Requests   Final    Normal Performed at [facility], [address] [place], [place] [address]    Gram Stain   Final    MODERATE WBC PRESENT, PREDOMINANTLY PMN FEW GRAM POSITIVE COCCI IN CLUSTERS Performed at [facility], [address], [place], [place] [address]    Culture RARE STAPHYLOCOCCUS AUREUS  Final   Report Status 03/04/2019 FINAL  Final   Organism ID, Bacteria STAPHYLOCOCCUS AUREUS  Final      Susceptibility   Staphylococcus aureus - MIC*    CIPROFLOXACIN <=0.5 SENSITIVE Sensitive     ERYTHROMYCIN >=8  RESISTANT Resistant     GENTAMICIN <=0.5 SENSITIVE Sensitive     OXACILLIN 0.5 SENSITIVE Sensitive     TETRACYCLINE <=1 SENSITIVE Sensitive     VANCOMYCIN <=0.5 SENSITIVE Sensitive     TRIMETH/SULFA <=10 SENSITIVE Sensitive     CLINDAMYCIN <=0.25 SENSITIVE Sensitive     RIFAMPIN <=0.5 SENSITIVE Sensitive     Inducible Clindamycin NEGATIVE Sensitive     * RARE STAPHYLOCOCCUS AUREUS  Culture, blood (routine x 2)     Status: None   Collection Time: 03/02/19  7:10 PM   Specimen: BLOOD RIGHT ARM  Result Value Ref Range Status   Specimen Description   Final    BLOOD RIGHT ARM Performed at [facility], [address] [place], [place] [address]    Special Requests   Final    BOTTLES DRAWN AEROBIC AND ANAEROBIC Blood Culture adequate volume Performed at [facility], [address], [place], [place] [address]    Culture   Final    NO GROWTH 5 DAYS Performed at [facility], [address] [place], [place] [address]    Report Status 03/07/2019 FINAL  Final  Culture, blood (routine x 2)     Status: None   Collection Time: 03/02/19  7:17 PM   Specimen: BLOOD LEFT ARM  Result Value Ref Range Status   Specimen Description   Final    BLOOD LEFT ARM Performed at [facility], [address], [place], [place] [address]    Special Requests   Final    BOTTLES DRAWN AEROBIC ONLY Blood Culture adequate volume Performed at [facility], [address] [place], [place] [address]    Culture   Final    NO GROWTH 5 DAYS Performed at [facility], [address] [place], [place] [address]    Report Status 03/07/2019 FINAL  Final     Radiology Studies: Ct Chest W Contrast  Result Date: 03/08/2019 CLINICAL DATA:  Septic pulmonary emboli; history rheumatic fever, hypertension, drug abuse EXAM: CT CHEST WITH CONTRAST TECHNIQUE: Multidetector CT imaging of the chest was performed during intravenous contrast administration. Sagittal  and coronal MPR images reconstructed from axial data set. CONTRAST:  56mL OMNIPAQUE IOHEXOL 300 MG/ML  SOLN IV COMPARISON:  02/28/2019 FINDINGS: Cardiovascular: Vascular structures patent. No pericardial effusion. Heart normal size. Pulmonary arteries grossly unremarkable for phase of imaging. Mediastinum/Nodes: Base of cervical region normal appearance. Patient cachectic. No definite adenopathy. Lungs/Pleura: Multiple masses are identified throughout both lungs, many cavitary, consistent with septic emboli. Largest RIGHT lung lesion 2.7 x 2.7 cm RIGHT middle lobe image 123. Largest LEFT lung lesion in lingula 4.7 x 4.5 cm image 101. Dependent atelectasis in the posterior lungs bilaterally. Interval decrease in size of significant LEFT pneumothorax component. Partially  loculated LEFT pleural effusion with air-fluid level. Upper Abdomen: Small cyst upper LEFT kidney. Remaining visualized upper abdomen unremarkable. Musculoskeletal: No acute osseous lesions. IMPRESSION: Persistent loculated LEFT hydropneumothorax, with decrease in pneumothorax component since previous exam. Septic emboli throughout both lungs, little changed. Electronically Signed   By: Ulyses SouthwardMark  Boles M.D.   On: 03/08/2019 10:37    Scheduled Meds: . amLODipine  10 mg Oral Daily  . Chlorhexidine Gluconate Cloth  6 each Topical Daily  . feeding supplement (ENSURE ENLIVE)  237 mL Oral BID BM  . feeding supplement (PRO-STAT SUGAR FREE 64)  30 mL Oral TID  . hepatitis b vaccine  0.5 mL Intramuscular Once  . insulin aspart  0-24 Units Subcutaneous TID AC & HS  . mouth rinse  15 mL Mouth Rinse BID  . metoprolol tartrate  50 mg Oral BID  . multivitamin with minerals  1 tablet Oral Daily  . oxyCODONE  10 mg Oral Q12H  . pantoprazole  40 mg Oral Daily  . sodium chloride flush  10-40 mL Intracatheter Q12H   Continuous Infusions: . sodium chloride 250 mL (03/07/19 2139)  .  ceFAZolin (ANCEF) IV 2 g (03/09/19 1322)  . sodium chloride       LOS:  15 days   Time spent: 35 minutes.  Arnetha CourserSumayya Johann Gascoigne, MD [facility] Pager [phone]  If 7PM-7AM, please contact night-coverage [email or web address] Password [email or web address] 03/09/2019, 5:25 PM   This record has been created using Dragon voice recognition software. Errors have been sought and corrected,but may not always be located. Such creation errors do not reflect on the standard of care.

## 2019-03-09 NOTE — Progress Notes (Signed)
Nutrition Follow-up  DOCUMENTATION CODES:   Severe malnutrition in context of social or environmental circumstances  INTERVENTION:   -Ensure Enlive po BID, each supplement provides 350 kcal and 20 grams of protein -Prostat liquid protein PO 30 ml TID with meals, each supplement provides 100 kcal, 15 grams protein. -Magic cup TID with meals, each supplement provides 290 kcal and 9 grams of protein -Multivitamin with minerals daily  NUTRITION DIAGNOSIS:   Severe Malnutrition related to social / environmental circumstances(IVDU) as evidenced by severe fat depletion, severe muscle depletion.  Ongoing.  GOAL:   Patient will meet greater than or equal to 90% of their needs  Progressing.  MONITOR:   PO intake, Supplement acceptance, Weight trends, Skin, I & O's, Labs  REASON FOR ASSESSMENT:   Consult Assessment of nutrition requirement/status  ASSESSMENT:   48 y.o. male with medical history of IVDU, ongoing heroin use. Patient presented to the ED on 10/27 with 3 week history of progressively worsening back pain and BLE weakness.  Denies any urinary retention but has noticed some bowel incontinence. Stated that he has had increasing trouble walking and that he has lost weight. Last use of heroin was today (10/27).  10/28 - intubated 10/31 - extubated 11/03 - TEE 11/04 - s/p chest tube insertion for empyema and pneumothorax  **RD working remotely**  Patient currently consuming 50-100% of meals, along with ice creams in between meals. Pt is drinking ~1 Ensure daily and is taking Prostat supplements. Will add Magic Cups to meals as well for additional kcals and protein and increase dose of Prostat.  Admission weight: 140 lbs. Current weight: 121 lbs.   I/Os: -10.5L since 10/29. UOP: 3275 ml x 24 hrs CT: 75 ml  Medications: Multivitamin with minerals daily  Labs reviewed: CBGs: 117-122  Diet Order:   Diet Order            Diet regular Room service appropriate? Yes;  Fluid consistency: Thin  Diet effective now              EDUCATION NEEDS:   No education needs have been identified at this time  Skin:  Skin Assessment: Skin Integrity Issues: Skin Integrity Issues:: Stage I Stage I: sacrum  Last BM:  11/12 -type 5  Height:   Ht Readings from Last 1 Encounters:  02/28/19 6' (1.829 m)    Weight:   Wt Readings from Last 1 Encounters:  03/08/19 55 kg    Ideal Body Weight:  80.9 kg  BMI:  Body mass index is 16.44 kg/m.  Estimated Nutritional Needs:   Kcal:  2100-2300  Protein:  100-120 grams  Fluid:  >/= 2.0 L  Clayton Bibles, MS, RD, LDN Inpatient Clinical Dietitian Pager: [phone] After Hours Pager: [phone]

## 2019-03-09 NOTE — Progress Notes (Signed)
..   NAME:  Franklin Anderson, MRN:  [id number], DOB:  14-Sep-1970, LOS: 82 ADMISSION DATE:  02/21/2019, CONSULTATION DATE:  02/28/2019 REFERRING MD:  Earnest Conroy MD, CHIEF COMPLAINT:  SOB   Brief History   48 yr old M with PMHx of rheumatic fever, HTN, IV heroin abuse presented on 10/27 w/ back pain and lower ext weakness. 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 Recent TEE shows mobile mass moderate in size located in the mid/ apical  RV that appears to be attached to a trabeculation. NP overnight evaluated patient at shift change noted to be tachypneic. New CXR shows left sided loculated pneumothorax. PCCM consulted    Past Medical History  .Marland Kitchen Active Ambulatory Problems    Diagnosis Date Noted  . Shortness of breath 10/01/2015  . Dyspnea 10/01/2015  . HTN (hypertension) 10/01/2015  . 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 Heroin use 10/01/2015  . Tobacco abuse 10/01/2015  . Drug abuse ([place])   . Hypertension 04/27/2010  . H/O: rheumatic fever    Resolved Ambulatory Problems    Diagnosis Date Noted  . No Resolved Ambulatory Problems   No Additional Past Medical History     Significant Hospital Events    10/27 with worsening back pain and BLE weakness.  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 I nitially evaluated by PCCM on 10/28 when pt became obtunded and dyspneic. Pt was endotracheally intubated for acute hypoxemic respiratory failure. He received a Left IJ CVC on 10/28 with follow up CXR showing no complications. He received IV Antibiotics for metastatic MSSAB infection, initially on Vancomycin and Cefepime tapered to Ancef.  He was evaluated with bedside echo and found to have pericardial effusion without tamponade. Formal TTE showed G!DD w EF 60% and possible mitral valve vegetation.  Pt received TEE on 11/3 at [facility] (w/o intubation, tolerated procedure well) and was found to have  mobile mass moderate in size located in the mid/  apical  RV that appears to be attached to a trabeculation.  11/3: NP overnight evaluated patient at shift change noted the pt to be tachypneic. Pt reported to NP that he felt more SOB than baseline not not in acute distress with no significant desaturations. A New CXR was completed: results were called to [facility] by NP.  It shows a new left sided loculated pneumothorax.  11/4: Spoke w/ Dr Prescott Gum via phone who reviewed imaging and recommended chest tube placement. Chest tube placed, marked improvement in pneumothorax 11/6: Pleural fluid volume much improved.  Ongoing 1 out of 7 airleak.  Suction from pleural tube increased to 30.  Awaiting cultures, currently has gram-positive cocci in clusters 11/9 still w/ airleak. Got symptomatic (more short of breath) when chest tube sxn was not functioning  11/10 on going BPF. Asked for formal thoracic eval. Concerned that we won't be able to get CT out.  11/12: CT of chest: .Persistent loculated LEFT hydropneumothorax, with decrease in pneumothorax component since previous exam.Septic emboli throughout both lungs, little changed  Pain better controlled.  Ongoing BPF, with velocity of air leak improved.  Consults:  10/28>>Infectious Disease 10/28>>PCCM 10/29>> Cardiology 11/3>>> PCCM re-consult 11/4: Phone consult with thoracic surgery Procedures:  10/28>>CT guided aspiration of right psoas abscesses 10/28>>Endotracheally intubated 10/28>> LIJ CVC placement 10/28>>Bronchoscopy with BAL 10/31>>Extubated to 3L [place] by RT 11/3>>> Transesophageal Echo 11/4: Left-sided chest tube   Micro Data:  Bllood cx x 2 on 10/27>>> + MSSA Blood cx  On 10/28>>>>>>+  MSSA Abscess Rt Psoas>>MODERATE STAPHYLOCOCCUS AUREUS 10/28 resp cx on 10/28>>MODERATE STAPHYLOCOCCUS AUREUS  Repeat Blood cx on 10/30>>NGTD (4 days) Pleural fluid 11/4>>>MSSA Antimicrobials:  Abx course--Cefepime10/28 >>10/29 ,Vanc 10/28 >>10/30 Cefazolin 10/30>>   Subjective   Feeling stronger  pain better controlled  Objective   Blood pressure (Abnormal) 141/105, pulse (Abnormal) 101, temperature 98.1 F (36.7 C), temperature source Oral, resp. rate 18, height 6' (1.829 m), weight 55 kg, SpO2 95 %.        Intake/Output Summary (Last 24 hours) at 03/09/2019 0941 Last data filed at 03/09/2019 7628 Gross per 24 hour  Intake 679.65 ml  Output 2900 ml  Net -2220.35 ml   Filed Weights   03/06/19 0318 03/07/19 0500 03/08/19 0500  Weight: 58.9 kg 56.9 kg 55 kg    Examination: General this is a 48 year old cachectic white male he is resting in bed and currently in no acute distress HEENT temporal wasting mucous membranes moist no JVD poor dentition Pulmonary occasional rhonchorous breath sounds on the left.  Intermittent 1 out of 7 airleak from chest tube still draining significant purulent drainage Cardiac: Regular rate and rhythm Abdomen: Soft not tender no organomegaly Extremities: Warm dry brisk capillary refill Neuro: Awake oriented no focal deficits.  Resolved Hospital Problem list   Uremia and Hypernatremia resolving AKI Extubated on 10/31 Blood cx on 10/ Hypernatremia   Assessment & Plan:   Cavitary PNA w/  Numerous rounded and cavitary fluid-filled lesions in the left lung Left Hydropneumothorax concerning for empyema BPF Septic VTE Pulmonary emboli  MSSA bacteremia/endocarditis w/ mobile mass in the RV further c/b : Discitis, osteomyelitis, bilateral psoas muscle abscesses, septic emboli of lungs and cavitary PNA also small pericardial effusion HTN Cirrhosis with ascites Normocytic Anemia Intravascular hemolytic process>>Schistocytes on smear   pulm problem Discussion MSSA cavitary pneumonia secondary to septic pulmonary emboli, and subsequent  empyema and what bronchopleural fistula S/p ultrasound-guided tube thoracostomy on 11/4 CT chest was reviewed this demonstrated distant loculated left hydropneumothorax with decreasing air component multiple septic  emboli throughout both lungs without significant change -Paucity of air leak intermittently 1 out of 7 this is somewhat improved Plan/recommendation Cont abx as directed by ID Cont chest tube drainage-->will need this as long has he has BPF Intermittent CXR Address mobility Maximize nutrition      Critical care time:NA     Erick Colace ACNP-BC [employer] Pager # [phone] OR # [phone] if no answer

## 2019-03-09 NOTE — TOC Progression Note (Signed)
Transition of Care [facility]) - Progression Note    Patient Details  Name: Franklin Anderson MRN: [id number] Date of Birth: 1970-09-15  Transition of [facility]) CM/SW [place],  Phone Number: 03/09/2019, 9:50 AM  Clinical Narrative:   Staffed this multi-problem patient with supervisor who suggested I send his information to [facility].  They have been known in past to take this kind of patient. TOC will continue to follow during the course of hospitalization.     Expected Discharge Plan: Home/Self Care Barriers to Discharge: No Barriers Identified  Expected Discharge Plan and Services Expected Discharge Plan: Home/Self Care In-house Referral: Clinical Social Work     Living arrangements for the past 2 months: Single Family Home                                       Social Determinants of Health (SDOH) Interventions    Readmission Risk Interventions No flowsheet data found.

## 2019-03-09 NOTE — Progress Notes (Signed)
Physical Therapy Treatment Patient Details Name: Franklin Anderson MRN: [id number] DOB: Oct 01, 1970 Today's Date: 03/09/2019    History of Present Illness Pt is 48 y/o M with PMH: IVDA (heroine), who presented with worsening back pain and B LE weakness. 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 Pt was intubated 10/28, extubated 10/31.    PT Comments    General Comments: pt requires MAX encouragement to participate, easily gets agittated and does not have a full understanding of importance of mobility.  Unable to redirect due to agitation. General bed mobility comments: increased time and assist B LE off and onto bed as pt was unable due to pain.  General transfer comment: pt only agreed to perform bed to [facility] and back to bed transfer.  Very deconditioned.  Required increased time due to low back/sacral pain.  Good use of hands to steady self and transition without need for AD 1/4 pivot turn. Assisted back to bed as pt declined any further activity.    Follow Up Recommendations  Home health PT;SNF(pending medical)     Equipment Recommendations       Recommendations for Other Services       Precautions / Restrictions Precautions Precautions: Fall Precaution Comments: chest tube Restrictions Weight Bearing Restrictions: No    Mobility  Bed Mobility Overal bed mobility: Needs Assistance Bed Mobility: Sidelying to Sit;Sit to Sidelying   Sidelying to sit: Min assist     Sit to sidelying: Min assist;Mod assist General bed mobility comments: increased time and assist B LE off and onto bed as pt was unable due to pain  Transfers Overall transfer level: Needs assistance Equipment used: None Transfers: Sit to/from [facility] Sit to Stand: Min assist;+2 safety/equipment Stand pivot transfers: +2 safety/equipment;Min assist       General transfer comment: pt only agreed to perform bed to [facility] and  back to bed transfer.  Required increased time due to low back/sacral pain.  Good use of hands to steady self and transition without need for AD 1/4 pivot turn.  Ambulation/Gait             General Gait Details: pt declined   Marine scientist Rankin (Stroke Patients Only)       Balance                                            Cognition Arousal/Alertness: Awake/alert Behavior During Therapy: WFL for tasks assessed/performed Overall Cognitive Status: Within Functional Limits for tasks assessed                                 General Comments: pt requires MAX encouragement to participate, easily gets agittated and does not have a full understanding of importance of mobility.  Unable to redirect due to agitation.      Exercises      General Comments        Pertinent Vitals/Pain Pain Assessment: 0-10 Pain Score: 10-Worst pain ever Pain Location: low back/sacrum area Pain Descriptors / Indicators: Discomfort;Guarding;Sharp;Shooting;Stabbing;Grimacing;Constant Pain Intervention(s): Monitored during session;Patient requesting pain meds-RN notified;Repositioned    Home Living  Prior Function            PT Goals (current goals can now be found in the care plan section) Progress towards PT goals: Progressing toward goals    Frequency    Min 3X/week      PT Plan Current plan remains appropriate    Co-evaluation              AM-PAC PT "6 Clicks" Mobility   Outcome Measure  Help needed turning from your back to your side while in a flat bed without using bedrails?: A Little Help needed moving from lying on your back to sitting on the side of a flat bed without using bedrails?: A Little Help needed moving to and from a bed to a chair (including a wheelchair)?: A Lot Help needed standing up from a chair using your arms (e.g., wheelchair or bedside  chair)?: A Lot Help needed to walk in hospital room?: Total Help needed climbing 3-5 steps with a railing? : Total 6 Click Score: 12    End of Session   Activity Tolerance: Patient limited by pain;Other (comment)(self limiting) Patient left: in bed;with call bell/phone within reach;with bed alarm set Nurse Communication: Mobility status PT Visit Diagnosis: Muscle weakness (generalized) (M62.81);Difficulty in walking, not elsewhere classified (R26.2)     Time: 1425-1440 PT Time Calculation (min) (ACUTE ONLY): 15 min  Charges:  $Therapeutic Activity: 8-22 mins                     Felecia Shelling  PTA Acute  Rehabilitation Services Pager      [phone] Office      [phone]

## 2019-03-09 NOTE — [place] FL2 (Signed)
[place] MEDICAID FL2 LEVEL OF CARE SCREENING TOOL     IDENTIFICATION  Patient Name: Franklin Anderson Birthdate: [birth year] Sex: male Admission Date (Current Location): 02/21/2019  [facility] and [place] Number:  Producer, television/film/video and Address:  [facility],  [address] [place] [address]      Provider Number: [id number]  Attending Physician Name and Address:  Arnetha Courser, MD  Relative Name and Phone Number:       Current Level of Care: Hospital Recommended Level of Care: Skilled Nursing Facility Prior Approval Number:    Date Approved/Denied:   PASRR Number:    Discharge Plan: SNF    Current Diagnoses: Patient Active Problem List   Diagnosis Date Noted  . Chest tube in place   . Pain   . Pneumothorax   . Fistula   . Empyema, left (HCC)   . Acute pulmonary embolism (HCC)   . Pleural effusion on left   . MSSA bacteremia   . Abnormal echocardiogram   . Protein-calorie malnutrition, severe 02/23/2019  . Sepsis (HCC) 02/22/2019  . Endocarditis 02/22/2019  . Septic pulmonary embolism (HCC) 02/22/2019  . 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 Acute encephalopathy 02/22/2019  . AKI (acute kidney injury) (HCC) 02/22/2019  . Transaminitis 02/22/2019  . Psoas muscle abscess (HCC) 02/22/2019  . Pressure injury of skin 02/22/2019  . Mucus clot in bronchi   . Drug abuse (HCC)   . H/O: rheumatic fever   . Shortness of breath 10/01/2015  . Dyspnea 10/01/2015  . HTN (hypertension) 10/01/2015  . Methadone use (HCC) 10/01/2015  . Heroin use 10/01/2015  . Tobacco abuse 10/01/2015  . Hypertension 04/27/2010    Orientation RESPIRATION BLADDER Height & Weight     Self, Time, Situation, Place  Normal Continent Weight: 55 kg Height:  6' (182.9 cm)  BEHAVIORAL SYMPTOMS/MOOD NEUROLOGICAL BOWEL NUTRITION STATUS  (None) (none) Continent Diet(regular)  AMBULATORY STATUS COMMUNICATION OF NEEDS  Skin   Extensive Assist Verbally PU Stage and Appropriate Care(chest tube) PU Stage 1 Dressing: (Sacrum-mid  Q 3 days)                     Personal Care Assistance Level of Assistance  Bathing, Feeding, Dressing Bathing Assistance: Maximum assistance Feeding assistance: Independent Dressing Assistance: Limited assistance     Functional Limitations Info  Sight, Hearing, Speech Sight Info: Adequate Hearing Info: Adequate Speech Info: Adequate    SPECIAL CARE FACTORS FREQUENCY  PT (By licensed PT)(IV antibiotics)     PT Frequency: 5X/W              Contractures Contractures Info: Not present    Additional Factors Info                  Current Medications (03/09/2019):  This is the current hospital active medication list Current Facility-Administered Medications  Medication Dose Route Frequency Provider Last Rate Last Dose  . 0.9 %  sodium chloride infusion   Intravenous PRN Rai, Ripudeep K, MD 10 mL/hr at 03/07/19 2139 250 mL at 03/07/19 2139  . acetaminophen (TYLENOL) tablet 650 mg  650 mg Oral Q6H PRN Rai, Ripudeep K, MD   650 mg at 03/07/19 0544   Or  . acetaminophen (TYLENOL) suppository 650 mg  650 mg Rectal Q6H PRN Rai, Ripudeep K, MD      . acetaminophen (TYLENOL) tablet 650 mg  650 mg Oral Q6H PRN  Rai, Vernelle Emerald, MD   650 mg at 03/07/19 1205  . amLODipine (NORVASC) tablet 10 mg  10 mg Oral Daily Rai, Ripudeep K, MD   10 mg at 03/08/19 0926  . ceFAZolin (ANCEF) IVPB 2g/100 mL premix  2 g Intravenous Q8H Rai, Ripudeep K, MD 200 mL/hr at 03/09/19 0545 2 g at 03/09/19 0545  . Chlorhexidine Gluconate Cloth 2 % PADS 6 each  6 each Topical Daily Rai, Ripudeep K, MD   6 each at 03/08/19 0930  . clonazePAM (KLONOPIN) tablet 0.5 mg  0.5 mg Oral TID PRN Rai, Ripudeep K, MD   0.5 mg at 03/09/19 0751  . feeding supplement (ENSURE ENLIVE) (ENSURE ENLIVE) liquid 237 mL  237 mL Oral BID BM Rai, Ripudeep K, MD   237 mL at 03/08/19 1303  . feeding supplement (PRO-STAT  SUGAR FREE 64) liquid 30 mL  30 mL Oral BID Rai, Ripudeep K, MD   30 mL at 03/08/19 2119  . hydrALAZINE (APRESOLINE) injection 10 mg  10 mg Intravenous Q6H PRN Rai, Ripudeep K, MD   10 mg at 03/09/19 3664  . HYDROmorphone (DILAUDID) injection 1.5 mg  1.5 mg Intravenous Q2H PRN Rai, Ripudeep K, MD   1.5 mg at 03/09/19 0751  . insulin aspart (novoLOG) injection 0-24 Units  0-24 Units Subcutaneous TID AC & HS Rai, Ripudeep K, MD   2 Units at 03/09/19 0851  . ipratropium-albuterol (DUONEB) 0.5-2.5 (3) MG/3ML nebulizer solution 3 mL  3 mL Nebulization Q4H PRN Rai, Ripudeep K, MD   3 mL at 03/03/19 2103  . MEDLINE mouth rinse  15 mL Mouth Rinse BID Rai, Ripudeep K, MD   15 mL at 03/08/19 2120  . metoprolol tartrate (LOPRESSOR) tablet 50 mg  50 mg Oral BID Rai, Ripudeep K, MD   50 mg at 03/08/19 2119  . multivitamin with minerals tablet 1 tablet  1 tablet Oral Daily Rai, Ripudeep K, MD   1 tablet at 03/08/19 0926  . ondansetron (ZOFRAN) tablet 4 mg  4 mg Oral Q6H PRN Rai, Ripudeep K, MD       Or  . ondansetron (ZOFRAN) injection 4 mg  4 mg Intravenous Q6H PRN Rai, Ripudeep K, MD      . oxyCODONE (OXYCONTIN) 12 hr tablet 10 mg  10 mg Oral Q12H Rai, Ripudeep K, MD   10 mg at 03/08/19 2119  . oxyCODONE-acetaminophen (PERCOCET/ROXICET) 5-325 MG per tablet 1 tablet  1 tablet Oral Q4H PRN Rai, Vernelle Emerald, MD   1 tablet at 03/09/19 0851  . pantoprazole (PROTONIX) EC tablet 40 mg  40 mg Oral Daily Rai, Ripudeep K, MD   40 mg at 03/08/19 0926  . senna-docusate (Senokot-S) tablet 2 tablet  2 tablet Oral QHS PRN Rai, Vernelle Emerald, MD   2 tablet at 02/23/19 0911  . sodium chloride 0.9 % bolus 2,000 mL  2,000 mL Intravenous Once Rai, Ripudeep K, MD      . sodium chloride flush (NS) 0.9 % injection 10-40 mL  10-40 mL Intracatheter Q12H Rai, Ripudeep K, MD   10 mL at 03/08/19 2200  . sodium chloride flush (NS) 0.9 % injection 10-40 mL  10-40 mL Intracatheter PRN Rai, Vernelle Emerald, MD         Discharge  Medications: Please see discharge summary for a list of discharge medications.  Relevant Imaging Results:  Relevant Lab Results:   Additional Information [place], [place]

## 2019-03-09 NOTE — Progress Notes (Signed)
Bp = 146/102. Patient has history of HTN. Medication was given as scheduled.patient complaint pain at 6 at his back. Pain medication was given as MD ordered as well. Patient is alert and oriented x 4.

## 2019-03-09 NOTE — Progress Notes (Signed)
Occupational Therapy Treatment Patient Details Name: DEAVION DOBBS MRN: [id number] DOB: 1970-12-27 Today's Date: 03/09/2019    History of present illness Pt is 48 y/o M with PMH: IVDA (heroine), who presented with worsening back pain and B LE weakness. 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 Pt was intubated 10/28, extubated 10/31.   OT comments  Pt agreeable to theraband to strengthen his arms at bed level, on his own. He is familiar with exercises and did not want handouts.  Pt had mobilized with PT:  Did not want to perform any more mobility this pm   Follow Up Recommendations  Supervision/Assistance - 24 hour;Other (comment)    Equipment Recommendations  (tba further)    Recommendations for Other Services      Precautions / Restrictions Precautions Precautions: Fall Precaution Comments: chest tube Restrictions Weight Bearing Restrictions: No       Mobility Bed Mobility             Transfers                Balance                                           ADL either performed or assessed with clinical judgement   ADL                                               Vision       Perception     Praxis      Cognition Arousal/Alertness: Awake/alert Behavior During Therapy: WFL for tasks assessed/performed Overall Cognitive Status: Within Functional Limits for tasks assessed                                         Exercises Other Exercises Other Exercises: issued level 2 theraband for biceps and level one for shoulders within tolerance.  Pt familiar with exercises and didn't want handouts.  Tolerated well   Shoulder Instructions       General Comments      Pertinent Vitals/ Pain       Pain Assessment: 0-10 Pain Score: 10-Worst pain ever Pain Location: low back/sacrum area Pain Descriptors / Indicators:  Discomfort;Guarding;Sharp;Shooting;Stabbing;Grimacing;Constant Pain Intervention(s): Limited activity within patient's tolerance;Monitored during session;Repositioned  Home Living                                          Prior Functioning/Environment              Frequency  Min 2X/week        Progress Toward Goals  OT Goals(current goals can now be found in the care plan section)  Progress towards OT goals: Progressing toward goals     Plan      Co-evaluation                 AM-PAC OT "6 Clicks" Daily Activity     Outcome Measure   Help from another person eating meals?: None Help from another person taking care  of personal grooming?: A Little Help from another person toileting, which includes using toliet, bedpan, or urinal?: A Little Help from another person bathing (including washing, rinsing, drying)?: A Little Help from another person to put on and taking off regular upper body clothing?: A Little Help from another person to put on and taking off regular lower body clothing?: A Lot 6 Click Score: 18    End of Session        Activity Tolerance Patient tolerated treatment well   Patient Left in bed;with call bell/phone within reach   Nurse Communication          Time: 6759-1638 OT Time Calculation (min): 11 min  Charges: OT General Charges $OT Visit: 1 Visit OT Treatments $Therapeutic Exercise: 8-22 mins  Marica Otter, OTR/L Acute Rehabilitation Services [phone] [facility] pager [phone] office 03/09/2019   Cabrini Ruggieri 03/09/2019, 3:58 PM

## 2019-03-09 NOTE — Progress Notes (Addendum)
Subjective:  Back pain  persists  Antibiotics:  Anti-infectives (From admission, onward)   Start     Dose/Rate Route Frequency Ordered Stop   02/24/19 2200  vancomycin (VANCOCIN) IVPB 1000 mg/200 mL premix  Status:  Discontinued     1,000 mg 200 mL/hr over 60 Minutes Intravenous Every 12 hours 02/24/19 1010 02/24/19 1253   02/24/19 1600  ceFAZolin (ANCEF) IVPB 2g/100 mL premix     2 g 200 mL/hr over 30 Minutes Intravenous Every 8 hours 02/24/19 1253     02/23/19 2200  vancomycin (VANCOCIN) IVPB 750 mg/150 ml premix  Status:  Discontinued     750 mg 150 mL/hr over 60 Minutes Intravenous Every 24 hours 02/23/19 0853 02/23/19 0909   02/23/19 2200  vancomycin (VANCOCIN) IVPB 750 mg/150 ml premix  Status:  Discontinued     750 mg 150 mL/hr over 60 Minutes Intravenous Every 12 hours 02/23/19 0909 02/24/19 1010   02/22/19 2200  vancomycin (VANCOCIN) IVPB 750 mg/150 ml premix  Status:  Discontinued     750 mg 150 mL/hr over 60 Minutes Intravenous Every 24 hours 02/22/19 0826 02/22/19 0827   02/22/19 2200  vancomycin (VANCOCIN) IVPB 1000 mg/200 mL premix  Status:  Discontinued     1,000 mg 200 mL/hr over 60 Minutes Intravenous Every 24 hours 02/22/19 0827 02/23/19 0853   02/22/19 0900  ceFEPIme (MAXIPIME) 2 g in sodium chloride 0.9 % 100 mL IVPB  Status:  Discontinued     2 g 200 mL/hr over 30 Minutes Intravenous Every 12 hours 02/22/19 0826 02/23/19 1135   02/21/19 2015  ceFEPIme (MAXIPIME) 2 g in sodium chloride 0.9 % 100 mL IVPB     2 g 200 mL/hr over 30 Minutes Intravenous  Once 02/21/19 2013 02/22/19 0031   02/21/19 2015  metroNIDAZOLE (FLAGYL) IVPB 500 mg     500 mg 100 mL/hr over 60 Minutes Intravenous  Once 02/21/19 2013 02/22/19 0031   02/21/19 2015  vancomycin (VANCOCIN) IVPB 1000 mg/200 mL premix     1,000 mg 200 mL/hr over 60 Minutes Intravenous  Once 02/21/19 2013 02/22/19 0200      Medications: Scheduled Meds:  amLODipine  10 mg Oral Daily    Chlorhexidine Gluconate Cloth  6 each Topical Daily   feeding supplement (ENSURE ENLIVE)  237 mL Oral BID BM   feeding supplement (PRO-STAT SUGAR FREE 64)  30 mL Oral TID   insulin aspart  0-24 Units Subcutaneous TID AC & HS   mouth rinse  15 mL Mouth Rinse BID   metoprolol tartrate  50 mg Oral BID   multivitamin with minerals  1 tablet Oral Daily   oxyCODONE  10 mg Oral Q12H   pantoprazole  40 mg Oral Daily   sodium chloride flush  10-40 mL Intracatheter Q12H   Continuous Infusions:  sodium chloride 250 mL (03/07/19 2139)    ceFAZolin (ANCEF) IV 2 g (03/09/19 1322)   sodium chloride     PRN Meds:.sodium chloride, acetaminophen **OR** acetaminophen, acetaminophen, clonazePAM, hydrALAZINE, HYDROmorphone (DILAUDID) injection, ipratropium-albuterol, ondansetron **OR** ondansetron (ZOFRAN) IV, oxyCODONE-acetaminophen, senna-docusate, sodium chloride flush    Objective: Weight change:   Intake/Output Summary (Last 24 hours) at 03/09/2019 1404 Last data filed at 03/09/2019 1125 Gross per 24 hour  Intake 739.65 ml  Output 2950 ml  Net -2210.35 ml   Blood pressure (!) 134/91, pulse (!) 116, temperature 98 F (36.7 C), temperature source Oral, resp. rate 18, height 6' (1.829  m), weight 55 kg, SpO2 99 %. Temp:  [98 F (36.7 C)-100.2 F (37.9 C)] 98 F (36.7 C) (11/12 1347) Pulse Rate:  [101-121] 116 (11/12 1347) Resp:  [18-20] 18 (11/12 1347) BP: (122-145)/(91-105) 134/91 (11/12 1347) SpO2:  [94 %-99 %] 99 % (11/12 1347)  Physical Exam: General: Alert and awake, oriented x3,cachectic, facial wasting, sitting up and eating breakfast HEENT: anicteric sclera, EOMI CVStach rate, normal  No mgr Chest: , coarse breath sounds, CT in place with bloody , purulent material Abdomen: soft non-distended,  Extremities: 3+ edema in LE Skin: no rashes, + tatooos Neuro: nonfocal  CBC:    BMET Recent Labs    03/08/19 0610  NA 134*  K 4.2  CL 97*  CO2 28  GLUCOSE 111*    BUN 11  CREATININE 0.39*  CALCIUM 8.2*     Liver Panel  Recent Labs    03/08/19 0610  PROT 6.5  ALBUMIN 1.7*  AST 73*  ALT 42  ALKPHOS 92  BILITOT 0.4       Sedimentation Rate No results for input(s): ESRSEDRATE in the last 72 hours. C-Reactive Protein No results for input(s): CRP in the last 72 hours.  Micro Results: Recent Results (from the past 720 hour(s))  Blood Culture (routine x 2)     Status: Abnormal   Collection Time: 02/21/19  8:13 PM   Specimen: BLOOD LEFT FOREARM  Result Value Ref Range Status   Specimen Description   Final    BLOOD LEFT FOREARM Performed at [facility], [address] [place], [place] [address]    Special Requests   Final    BOTTLES DRAWN AEROBIC AND ANAEROBIC Blood Culture results may not be optimal due to an inadequate volume of blood received in culture bottles Performed at [facility] [address], [place], [place] [address]    Culture  Setup Time   Final    GRAM POSITIVE COCCI IN BOTH AEROBIC AND ANAEROBIC BOTTLES CRITICAL RESULT CALLED TO, READ BACK BY AND VERIFIED WITH: PHARMD J LEGGE [id number] AT 1337 BY CM CRITICAL RESULT CALLED TO, READ BACK BY AND VERIFIED WITH: PHARMD E JACKSON [id number] AT 1357 BY CM Performed at [facility], [place] [address] [place], [place] [address]    Culture STAPHYLOCOCCUS AUREUS (A)  Final   Report Status 02/24/2019 FINAL  Final   Organism ID, Bacteria STAPHYLOCOCCUS AUREUS  Final      Susceptibility   Staphylococcus aureus - MIC*    CIPROFLOXACIN <=0.5 SENSITIVE Sensitive     ERYTHROMYCIN >=8 RESISTANT Resistant     GENTAMICIN <=0.5 SENSITIVE Sensitive     OXACILLIN 0.5 SENSITIVE Sensitive     TETRACYCLINE <=1 SENSITIVE Sensitive     VANCOMYCIN 1 SENSITIVE Sensitive     TRIMETH/SULFA <=10 SENSITIVE Sensitive     CLINDAMYCIN <=0.25 SENSITIVE Sensitive     RIFAMPIN <=0.5 SENSITIVE Sensitive     Inducible Clindamycin NEGATIVE Sensitive     * STAPHYLOCOCCUS  AUREUS  SARS Coronavirus 2 by RT PCR (hospital order, performed in [facility] hospital lab) Nasopharyngeal Nasopharyngeal Swab     Status: None   Collection Time: 02/21/19  8:16 PM   Specimen: Nasopharyngeal Swab  Result Value Ref Range Status   SARS Coronavirus 2 NEGATIVE NEGATIVE Final    Comment: (NOTE) If result is NEGATIVE SARS-CoV-2 target nucleic acids are NOT DETECTED. The SARS-CoV-2 RNA is generally detectable in upper and lower  respiratory specimens during the acute phase  of infection. The lowest  concentration of SARS-CoV-2 viral copies this assay can detect is 250  copies / mL. A negative result does not preclude SARS-CoV-2 infection  and should not be used as the sole basis for treatment or other  patient management decisions.  A negative result may occur with  improper specimen collection / handling, submission of specimen other  than nasopharyngeal swab, presence of viral mutation(s) within the  areas targeted by this assay, and inadequate number of viral copies  (<250 copies / mL). A negative result must be combined with clinical  observations, patient history, and epidemiological information. If result is POSITIVE SARS-CoV-2 target nucleic acids are DETECTED. The SARS-CoV-2 RNA is generally detectable in upper and lower  respiratory specimens dur ing the acute phase of infection.  Positive  results are indicative of active infection with SARS-CoV-2.  Clinical  correlation with patient history and other diagnostic information is  necessary to determine patient infection status.  Positive results do  not rule out bacterial infection or co-infection with other viruses. If result is PRESUMPTIVE POSTIVE SARS-CoV-2 nucleic acids MAY BE PRESENT.   A presumptive positive result was obtained on the submitted specimen  and confirmed on repeat testing.  While 2019 novel coronavirus  (SARS-CoV-2) nucleic acids may be present in the submitted sample  additional confirmatory  testing may be necessary for epidemiological  and / or clinical management purposes  to differentiate between  SARS-CoV-2 and other Sarbecovirus currently known to infect humans.  If clinically indicated additional testing with an alternate test  methodology [phone]) is advised. The SARS-CoV-2 RNA is generally  detectable in upper and lower respiratory sp ecimens during the acute  phase of infection. The expected result is Negative. Fact Sheet for Patients:  StrictlyIdeas.no Fact Sheet for Healthcare Providers: BankingDealers.co.za This test is not yet approved or cleared by the [place] FDA and has been authorized for detection and/or diagnosis of SARS-CoV-2 by FDA under an Emergency Use Authorization (EUA).  This EUA will remain in effect (meaning this test can be used) for the duration of the COVID-19 declaration under Section 564(b)(1) of the Act, 21 U.S.C. section 360bbb-3(b)(1), unless the authorization is terminated or revoked sooner. Performed at [facility], [place] [address], [place], [place] [address]   MRSA PCR Screening     Status: None   Collection Time: 02/22/19  4:56 AM   Specimen: Nasal Mucosa; Nasopharyngeal  Result Value Ref Range Status   MRSA by PCR NEGATIVE NEGATIVE Final    Comment:        The GeneXpert MRSA Assay (FDA approved for NASAL specimens only), is one component of a comprehensive MRSA colonization surveillance program. It is not intended to diagnose MRSA infection nor to guide or monitor treatment for MRSA infections. Performed at [facility], [place] [address], [place], [place] [address]   Blood Culture (routine x 2)     Status: Abnormal   Collection Time: 02/22/19  5:33 AM   Specimen: BLOOD  Result Value Ref Range Status   Specimen Description   Final    BLOOD LEFT ANTECUBITAL Performed at [facility] [address],  [place], [place] [address]    Special Requests   Final    BOTTLES DRAWN AEROBIC AND ANAEROBIC Blood Culture adequate volume Performed at [facility] [address] [place], [place] [address]    Culture  Setup Time   Final    GRAM POSITIVE COCCI ANAEROBIC BOTTLE  ONLY CRITICAL RESULT CALLED TO, READ BACK BY AND VERIFIED WITH: PHARMD E JACKSON [id number] AT 1108 BY CM    Culture (A)  Final    STAPHYLOCOCCUS AUREUS SUSCEPTIBILITIES PERFORMED ON PREVIOUS CULTURE WITHIN THE LAST 5 DAYS. Performed at [facility], [place] [address], [place], [place] [address]    Report Status 02/24/2019 FINAL  Final  Aerobic/Anaerobic Culture (surgical/deep wound)     Status: None   Collection Time: 02/22/19 10:05 AM   Specimen: Abscess  Result Value Ref Range Status   Specimen Description   Final    ABSCESS RT PSOAS Performed at [facility] [address], [place], [place] [address]    Special Requests   Final    NONE Performed at [facility], [place] [address], [place], [place] [address]    Gram Stain   Final    RARE WBC PRESENT,BOTH PMN AND MONONUCLEAR FEW GRAM POSITIVE COCCI IN PAIRS    Culture   Final    MODERATE STAPHYLOCOCCUS AUREUS NO ANAEROBES ISOLATED Performed at [facility], [place] [address], [place], [place] [address]    Report Status 02/27/2019 FINAL  Final   Organism ID, Bacteria STAPHYLOCOCCUS AUREUS  Final      Susceptibility   Staphylococcus aureus - MIC*    CIPROFLOXACIN <=0.5 SENSITIVE Sensitive     ERYTHROMYCIN >=8 RESISTANT Resistant     GENTAMICIN <=0.5 SENSITIVE Sensitive     OXACILLIN 0.5 SENSITIVE Sensitive     TETRACYCLINE <=1 SENSITIVE Sensitive     VANCOMYCIN <=0.5 SENSITIVE Sensitive     TRIMETH/SULFA <=10 SENSITIVE Sensitive     CLINDAMYCIN <=0.25 SENSITIVE Sensitive     RIFAMPIN <=0.5 SENSITIVE Sensitive     Inducible Clindamycin NEGATIVE Sensitive     * MODERATE STAPHYLOCOCCUS AUREUS  Culture,  respiratory (non-expectorated)     Status: None   Collection Time: 02/22/19  5:30 PM   Specimen: Bronchoalveolar Lavage; Respiratory  Result Value Ref Range Status   Specimen Description   Final    BRONCHIAL ALVEOLAR LAVAGE Performed at [facility] [address] [place], [place] [address]    Special Requests   Final    NONE Performed at [facility], [place] [address], [place], [place] [address]    Gram Stain   Final    ABUNDANT WBC PRESENT, PREDOMINANTLY PMN MODERATE GRAM POSITIVE COCCI Performed at [facility], [place] [address], [place], [place] [address]    Culture MODERATE STAPHYLOCOCCUS AUREUS  Final   Report Status 02/25/2019 FINAL  Final   Organism ID, Bacteria STAPHYLOCOCCUS AUREUS  Final      Susceptibility   Staphylococcus aureus - MIC*    CIPROFLOXACIN <=0.5 SENSITIVE Sensitive     ERYTHROMYCIN >=8 RESISTANT Resistant     GENTAMICIN <=0.5 SENSITIVE Sensitive     OXACILLIN 0.5 SENSITIVE Sensitive     TETRACYCLINE <=1 SENSITIVE Sensitive     VANCOMYCIN 1 SENSITIVE Sensitive     TRIMETH/SULFA <=10 SENSITIVE Sensitive     CLINDAMYCIN <=0.25 SENSITIVE Sensitive     RIFAMPIN <=0.5 SENSITIVE Sensitive     Inducible Clindamycin NEGATIVE Sensitive     * MODERATE STAPHYLOCOCCUS AUREUS  Culture, blood (Routine X 2) w Reflex to ID Panel     Status: None   Collection Time: 02/24/19 11:47 AM   Specimen: BLOOD LEFT HAND  Result Value Ref Range Status   Specimen Description   Final    BLOOD LEFT HAND  Performed at [facility], [place] [address] [place], [place] [address]    Special Requests   Final    BOTTLES DRAWN AEROBIC AND ANAEROBIC Blood Culture adequate volume Performed at [facility] [address], [place], [place] [address]    Culture   Final    NO GROWTH 5 DAYS Performed at [facility], [place] [address], [place], [place] [address]    Report Status 03/01/2019 FINAL  Final  Body  fluid culture     Status: None   Collection Time: 03/01/19  1:30 PM   Specimen: Chest; Body Fluid  Result Value Ref Range Status   Specimen Description   Final    CHEST Performed at [facility] [address], [place], [place] [address]    Special Requests   Final    Normal Performed at [facility], [place] [address] [place], [place] [address]    Gram Stain   Final    MODERATE WBC PRESENT, PREDOMINANTLY PMN FEW GRAM POSITIVE COCCI IN CLUSTERS Performed at [facility], [place] [address], [place], [place] [address]    Culture RARE STAPHYLOCOCCUS AUREUS  Final   Report Status 03/04/2019 FINAL  Final   Organism ID, Bacteria STAPHYLOCOCCUS AUREUS  Final      Susceptibility   Staphylococcus aureus - MIC*    CIPROFLOXACIN <=0.5 SENSITIVE Sensitive     ERYTHROMYCIN >=8 RESISTANT Resistant     GENTAMICIN <=0.5 SENSITIVE Sensitive     OXACILLIN 0.5 SENSITIVE Sensitive     TETRACYCLINE <=1 SENSITIVE Sensitive     VANCOMYCIN <=0.5 SENSITIVE Sensitive     TRIMETH/SULFA <=10 SENSITIVE Sensitive     CLINDAMYCIN <=0.25 SENSITIVE Sensitive     RIFAMPIN <=0.5 SENSITIVE Sensitive     Inducible Clindamycin NEGATIVE Sensitive     * RARE STAPHYLOCOCCUS AUREUS  Culture, blood (routine x 2)     Status: None   Collection Time: 03/02/19  7:10 PM   Specimen: BLOOD RIGHT ARM  Result Value Ref Range Status   Specimen Description   Final    BLOOD RIGHT ARM Performed at [facility], [address] [place], [place] [address]    Special Requests   Final    BOTTLES DRAWN AEROBIC AND ANAEROBIC Blood Culture adequate volume Performed at [facility] [address], [place], [place] [address]    Culture   Final    NO GROWTH 5 DAYS Performed at [facility], [place] [address] [place], [place] [address]    Report Status 03/07/2019 FINAL  Final  Culture, blood (routine x 2)     Status: None   Collection Time: 03/02/19  7:17 PM    Specimen: BLOOD LEFT ARM  Result Value Ref Range Status   Specimen Description   Final    BLOOD LEFT ARM Performed at [facility], [place] [address] [place], [place] [address]    Special Requests   Final    BOTTLES DRAWN AEROBIC ONLY Blood Culture adequate volume Performed at [facility] [address] [place], [place] [address]    Culture   Final    NO GROWTH 5 DAYS Performed at [facility], [place] [address] [place], [place] [address]    Report Status 03/07/2019 FINAL  Final    Studies/Results: Ct Chest W Contrast  Result Date: 03/08/2019 CLINICAL DATA:  Septic pulmonary emboli; history rheumatic fever, hypertension, drug abuse  EXAM: CT CHEST WITH CONTRAST TECHNIQUE: Multidetector CT imaging of the chest was performed during intravenous contrast administration. Sagittal and coronal MPR images reconstructed from axial data set. CONTRAST:  78m OMNIPAQUE IOHEXOL 300 MG/ML  SOLN IV COMPARISON:  02/28/2019 FINDINGS: Cardiovascular: Vascular structures patent. No pericardial effusion. Heart normal size. Pulmonary arteries grossly unremarkable for phase of imaging. Mediastinum/Nodes: Base of cervical region normal appearance. Patient cachectic. No definite adenopathy. Lungs/Pleura: Multiple masses are identified throughout both lungs, many cavitary, consistent with septic emboli. Largest RIGHT lung lesion 2.7 x 2.7 cm RIGHT middle lobe image 123. Largest LEFT lung lesion in lingula 4.7 x 4.5 cm image 101. Dependent atelectasis in the posterior lungs bilaterally. Interval decrease in size of significant LEFT pneumothorax component. Partially loculated LEFT pleural effusion with air-fluid level. Upper Abdomen: Small cyst upper LEFT kidney. Remaining visualized upper abdomen unremarkable. Musculoskeletal: No acute osseous lesions. IMPRESSION: Persistent loculated LEFT hydropneumothorax, with decrease in pneumothorax component since previous exam. Septic emboli throughout  both lungs, little changed. Electronically Signed   By: MLavonia DanaM.D.   On: 03/08/2019 10:37      Assessment/Plan:  INTERVAL HISTORY:  CT surgery has seen pt  CT chest shows multiple septic emboli in the lungs and Persistent loculated LEFT hydropneumothorax with reduced size of PTX  Principal Problem:   Sepsis ([place] Active Problems:   Dyspnea   Heroin use   Endocarditis   Septic pulmonary embolism (HCC)   Osteomyelitis of thoracic spine (HCC)   Osteomyelitis of lumbar spine (HCC)   Acute encephalopathy   AKI (acute kidney injury) ([place]   Transaminitis   Psoas muscle abscess (HCC)   Pressure injury of skin   Mucus clot in bronchi   Protein-calorie malnutrition, severe   Abnormal echocardiogram   MSSA bacteremia   Acute pulmonary embolism (HCC)   Pleural effusion on left   Empyema, left (HCC)   Chest tube in place   Pain   Pneumothorax   Fistula    Franklin SHACKETTis a 48y.o. 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 He has septic embolization to the lungs with cavitary pathology and now loculated pneumothorax concerning for empyema he also has a pulmonary embolism. He has BP fistula.  He is sp Chest TUBE placement He does also have a small pericardial effusion.  Repeat TTE shows RV vegetation that I had not seen in the TEE read from last week and smaller pericardial effusion  #1 Metastatic MSSAB as described:       [place] Antimicrobial Management Team Staphylococcus aureus bacteremia   Staphylococcus aureus bacteremia (SAB) is associated with a high rate of complications and mortality.  Specific aspects of clinical management are critical to optimizing the outcome of patients with SAB.  Therefore, [facility] ([facility] has initiated an intervention aimed at  [facility]  To do so, Infectious Diseases physicians are providing an evidence-based consult for the management of all patients with SAB.     Yes No Comments  Perform follow-up blood cultures (even if the patient is afebrile) to ensure clearance of bacteremia _0  _1  Repeated post central line re and removal and NG FINAL  Remove vascular catheter and obtain follow-up blood cultures after the removal of the catheter _2  _3  coud have PICC IF he is going to  stay here for all of his IV antibiiotics  Perform echocardiography to evaluate for endocarditis (transthoracic ECHO is 40-50% sensitive, TEE is > 90% sensitive) _0  _1  TEE was NOT negative but in fact + RVentricular lesion that is likely a vegetation + pericardial effusion  Consult electrophysiologist to evaluate implanted cardiac device (pacemaker, ICD) _2  _3    Ensure source control _4  _5  Need to monitor Neuro exam, CT tube in place and seen by Dr. Gennie Alma who wants to try current therapy rather than hazard thoracotomy at this point  Investigate for metastatic sites of infection _6  _7  Does the patient have ANY symptom or physical exam finding that would suggest a deeper infection (back or neck pain that may be suggestive of vertebral osteomyelitis or epidural abscess, muscle pain that could be a symptom of pyomyositis)?  Keep in mind that for deep seeded infections MRI imaging with contrast is preferred rather than other often insensitive tests such as plain x-rays, especially early in a patient's presentation.  Change antibiotic therapy to cefazolin _8  _9  Beta-lactam antibiotics are preferred for MSSA due to higher cure rates.   If on Vancomycin, goal trough should be 15 - 20 mcg/mL  Estimated duration of IV antibiotic therapy:  6-8 weeks but would need to be int he hospital _10  _11  Consult case management for probably prolonged outpatient IV antibiotic therapy   #2 RVentricular vegeation  Endocarditis  Evaluation   Infective endocarditis (IE) is associated with a high rate of complications and mortality.  Specific aspects of clinical management are critical to optimizing the outcome of patients with IE. Therefore, the Pharmacy Residency Program at [facility] has initiated an evidence-based consult aimed at improving the management of IE at [facility].     Comments  Consult to ID Outpatient follow-up appointment to be made prior to discharge.  Utilization of ID recommended antibiotic therapy   Surgery Evaluation Cardiothoracic surgery consult if the following indications are present:  - Heart failure associated to valve dysfunction - Endocarditis complicated by heart block or aortic abscess - Left-sided endocarditis caused by S. aureus, fungal, or multi-drug resistant organisms - Left-sided endocarditis with severe valve dysfunction - Large vegetation (>1 cm) on aortic or mitral valve - Persistence of infection 5-7 days after initiation of appropriate antibiotic therapy - Recurrent emboli and persistent vegetations despite appropriate antibiotic therapy - Any history with left-sided embolization with persistent vegetations on TEE  Consult electrophysiologist if presence of implanted cardiac device (pacemaker, ICD)   Consult to cardiology if left ventricular ejection fraction is <40% Evaluation for appropriate heart failure medications upon discharge: - ACE/ARB - Diuretic - Beta blocker  Outpatient follow-up appointment to be made prior to discharge.   Based on the ID MD's evaluation of this patient, the following are recommended to complete a thorough evaluation and care plan to reflect guideline-recommended therapy = ID appt made.    #3  Lumbar diskitis/vertebral osteomyelitis: monitor Neuro exam and continue antibiotics  If back pain worsens or not improving by week from now would repeat MRI L spine  #4  Loculated effusion with pneumothorax and BP fistula  Continue close  monitoring  Dr. 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   I will chart out plan of 6 weeks of IV antibiotics  in the hospital. He will assuredly need to be here as well to continue with his Chest tube, improve nutrition etc.    He has appt with me on January 4th  NOTE PRIOR TO DC PATIENT IN December, CALL us BACK TO SEE HIM AND ENSURE THAT [place] [address] [place]  In the  Meantime I will sign off. Please call with further questions.   LOS: 15 days   Franklin Anderson 03/09/2019, 2:04 PM

## 2019-03-10 ENCOUNTER — Inpatient Hospital Stay (HOSPITAL_COMMUNITY): Payer: Self-pay

## 2019-03-10 LAB — CBC
HCT: 26.3 % — ABNORMAL LOW (ref 39.0–52.0)
Hemoglobin: 8.3 g/dL — ABNORMAL LOW (ref 13.0–17.0)
MCH: 28.9 pg (ref 26.0–34.0)
MCHC: 31.6 g/dL (ref 30.0–36.0)
MCV: 91.6 fL (ref 80.0–100.0)
Platelets: 363 10*3/uL (ref 150–400)
RBC: 2.87 MIL/uL — ABNORMAL LOW (ref 4.22–5.81)
RDW: 17.8 % — ABNORMAL HIGH (ref 11.5–15.5)
WBC: 7.4 10*3/uL (ref 4.0–10.5)
nRBC: 0 % (ref 0.0–0.2)

## 2019-03-10 LAB — GLUCOSE, CAPILLARY
Glucose-Capillary: 93 mg/dL (ref 70–99)
Glucose-Capillary: 112 mg/dL — ABNORMAL HIGH (ref 70–99)
Glucose-Capillary: 123 mg/dL — ABNORMAL HIGH (ref 70–99)
Glucose-Capillary: 133 mg/dL — ABNORMAL HIGH (ref 70–99)

## 2019-03-10 IMAGING — DX DG CHEST 2V
2 series · 3 of 3 positions shown · non-contrast
Comparison: [DATE]

CLINICAL DATA: Chest tube, left pleural effusion

EXAM:
CHEST - 2 VIEW

[chest lat]
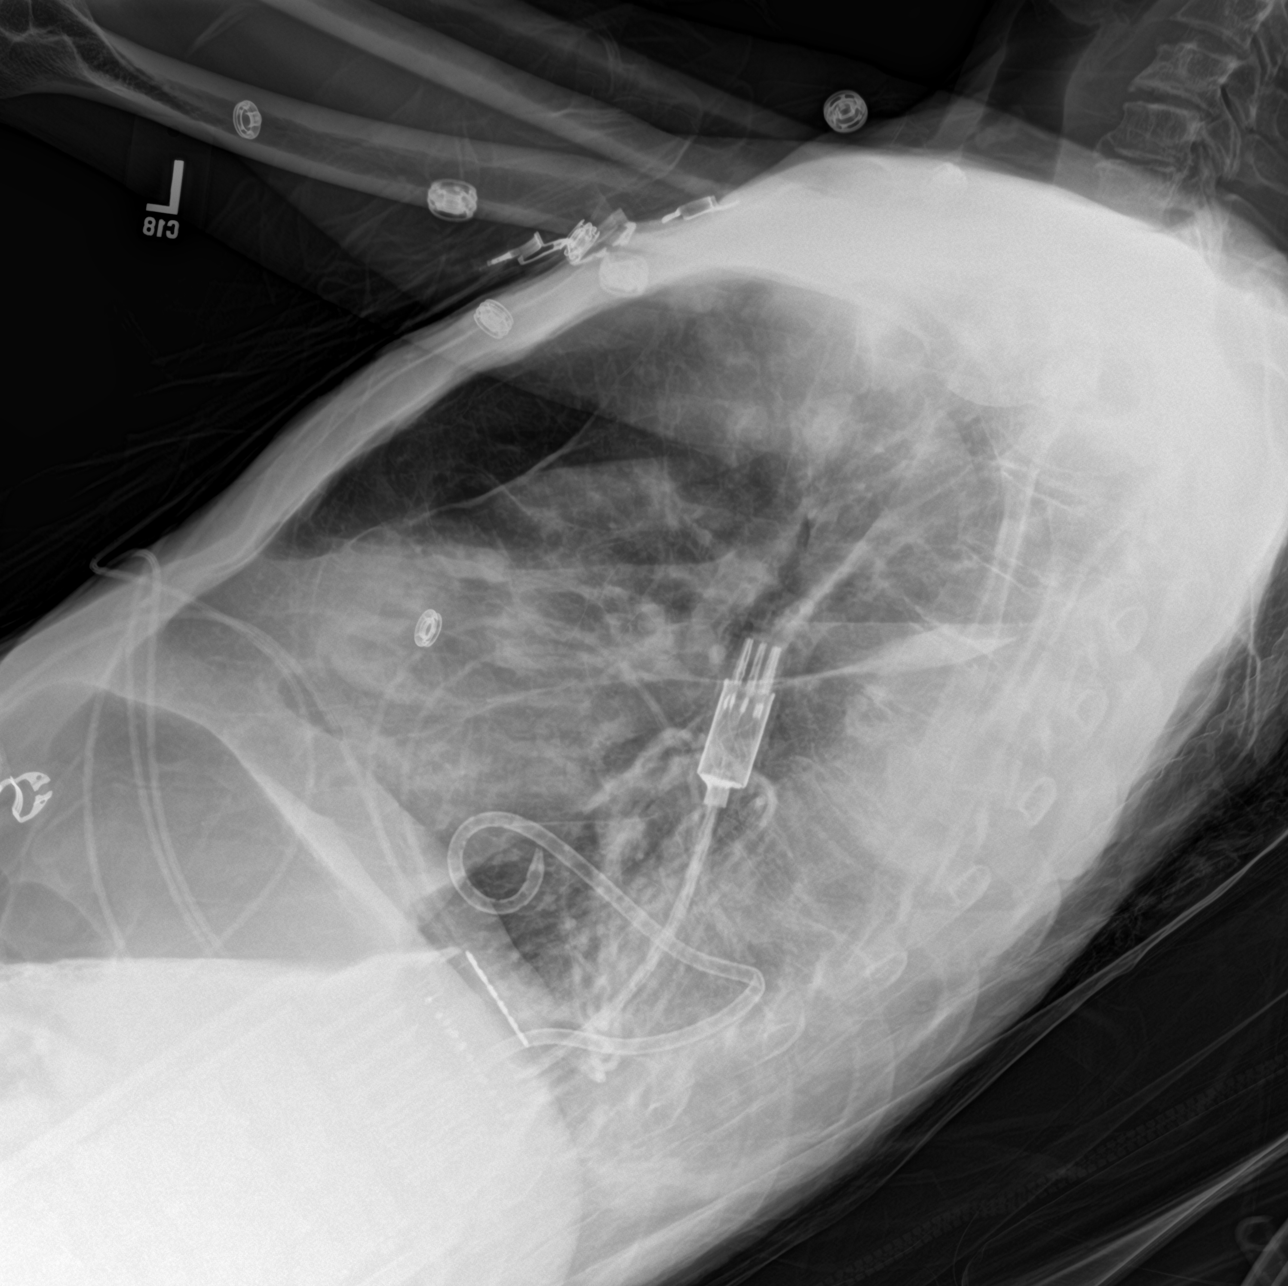

[Series 3: chest ap · 0.14mm/px · 2 of 2 slices shown]
[im 1/2]
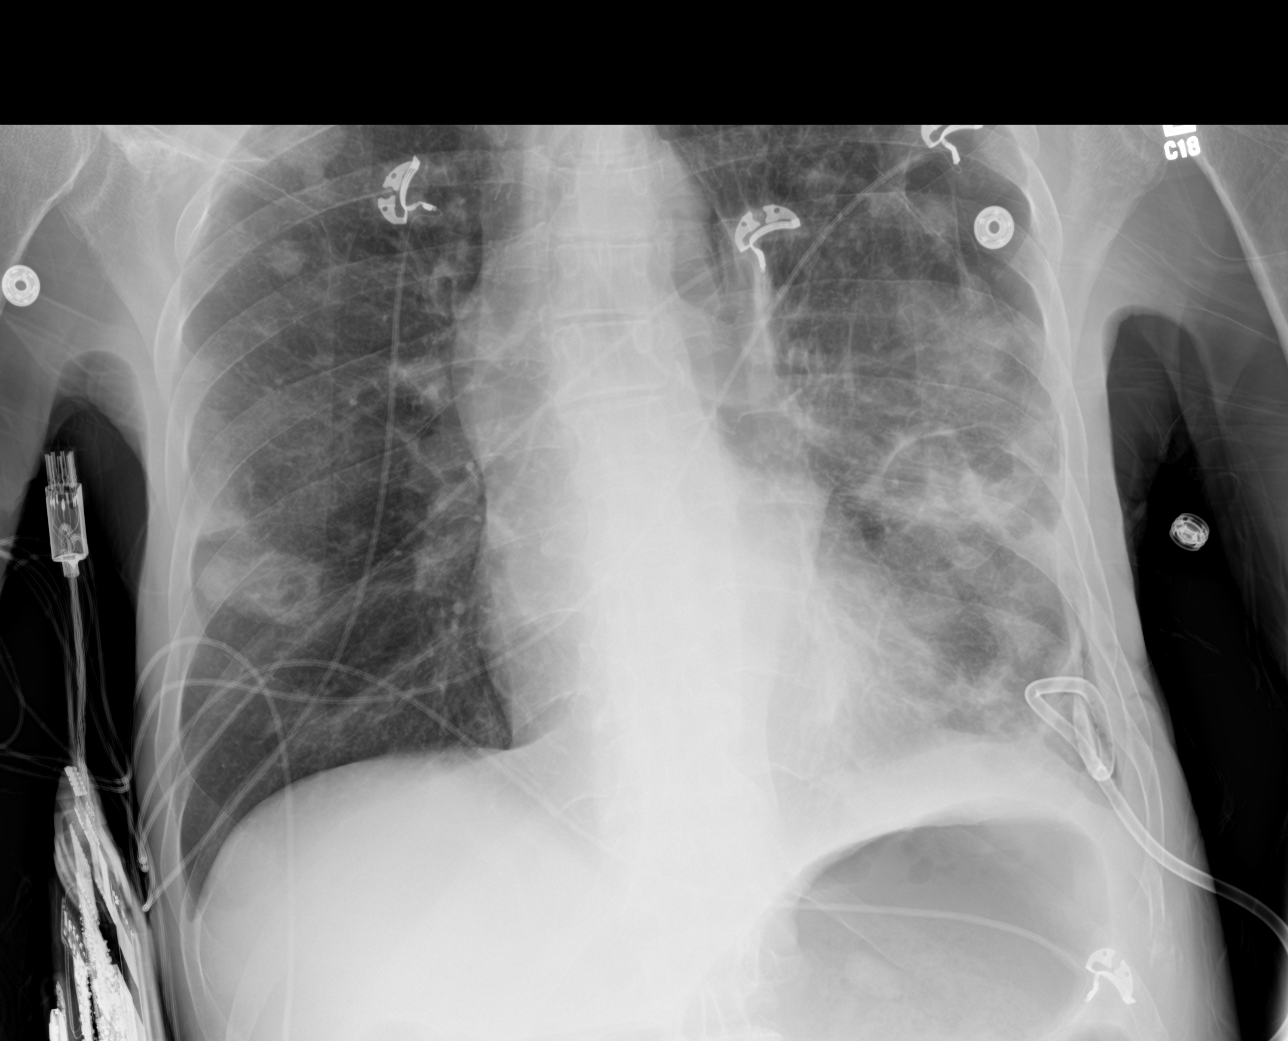
[im 2/2]
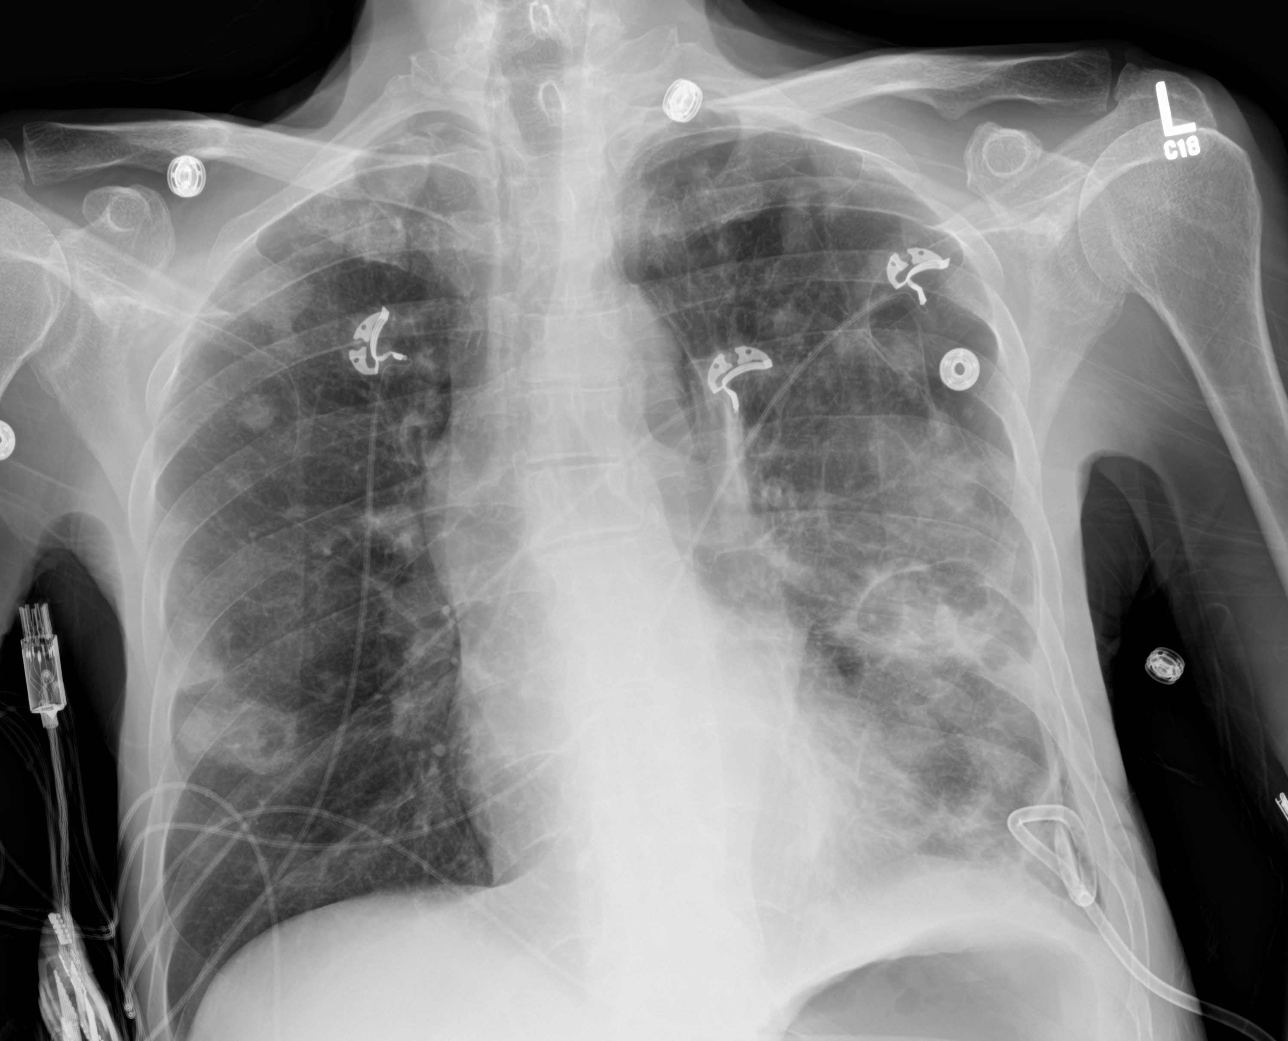

[3 of 3 positions shown; findings below may reference images not displayed]

FINDINGS: Left-sided pigtail chest tube remains in position about the left
lung base, with a small, persistent left apical pneumothorax, not
significantly changed compared to prior examination. Small,
persistent pleural effusion component about the left lung base.
Multiple pulmonary nodules are again noted bilaterally, some of
which are cavitary. The heart and mediastinum are unremarkable.
IMPRESSION: 1. Left-sided pigtail chest tube remains in position about the left
lung base, with a small, persistent left apical pneumothorax, not
significantly changed compared to prior examination. Small,
persistent pleural effusion component about the left lung base.

2. Multiple pulmonary nodules are again noted bilaterally, some of
which are cavitary.

## 2019-03-10 MED ORDER — LIDOCAINE 5 % EX PTCH
1.0000 | MEDICATED_PATCH | CUTANEOUS | Status: DC
  Administered 2019-03-11 – 2019-04-14 (×31): 1 via TRANSDERMAL
  Filled 2019-03-10 (×37): qty 1

## 2019-03-10 NOTE — Progress Notes (Signed)
PROGRESS NOTE    Franklin Anderson  PIR:[id number] DOB: 10/14/1970 DOA: 02/21/2019 PCP: Julieanne Manson, MD   Brief Narrative:  48 y.o.malewith medical history significant of rheumatic fever, HTN, ongoingIV heroin abuse, presented to the ED on 10/27 with 3 week course of progressively worsening back pain and BLE weakness associated with some bowel incontinence and trouble walking. Last use of heroin was on the day of admission. He was found to be septic with MSSA bacteremia, endocarditis, osteomyelitis, discitis at L4 and L5, septic pulmonary emboli with loculated effusion and pneumothorax and psoas abscesses. On 10/28 was found to be obtunded and dyspneic and was intubated by critical care, extubated on 10/31. Right psoas abscess was aspirated on 10/28, wound care >MSSA. Respiratory culture from 10/28 also grew MSSA. 11/3: Patient underwent TEE and had acute respiratory distress later that evening with stat chest x-ray showing loculated pneumothorax/CT chest indicating empyema/PE/hydropneumothorax. Now seen by PCCM and post chest tube placement.Code status changed to DNR.  Subjective: Patient was complaining of back pain.   Assessment & Plan:   Principal Problem:   Sepsis (HCC) Active Problems:   Dyspnea   Heroin use   Endocarditis   Septic pulmonary embolism (HCC)   Osteomyelitis of thoracic spine (HCC)   Osteomyelitis of lumbar spine (HCC)   Acute encephalopathy   AKI (acute kidney injury) (HCC)   Transaminitis   Psoas muscle abscess (HCC)   Pressure injury of skin   Mucus clot in bronchi   Protein-calorie malnutrition, severe   Abnormal echocardiogram   MSSA bacteremia   Acute pulmonary embolism (HCC)   Pleural effusion on left   Empyema, left (HCC)   Chest tube in place   Pain   Pneumothorax   Fistula  MSSA bacteremia/sepsis with discitis/vertebral osteomyelitis/psoas abscess/septic pulmonary emboli/cavitary lesion, empyema, mural vegetation.Repeat CT  chest  shows persistent loculated left hydropneumothorax with decreased in pneumothorax component. Multiple septic emboli throughout both lungs remained unchanged. ID is following-appreciate the recommendations. CT surgery does not think that he is a good candidate for thoracotomy and muscle flap closure,plan is to continue with pigtail empyema tube for a long time. -Continue Ancef for now.  By Dr. Zenaida Niece dam patient should complete 6 weeks of Ancef in hospital and then discharged home on Keflex for another 30 days.  Acute hypoxic respiratory failure.Due to sepsis and multiple pulmonary emboli. Patient was extubated on 10/31. No current respiratory distress.  Psoas abscess S/PCT-guided right psoas aspiration by IR on 10/28 yielding 18 mL of purulent fluid, cultures grew MSSA -Pain is better controlled after starting long-acting OxyContin, continue as needed Dilaudid. -Add lidocaine patch for his back pain.  Pericardial effusion.Noted on previous echo. Resolved on repeat CT scan   Acute metabolic encephalopathy. Resolved  Hypernatremia. Resolved  AKI. Resolved  Normocytic anemia.Hemoglobin dropped to 8.3 today.  No obvious bleeding. -Continue to monitor, transfuse if drops below 7.  Hypertension. Normotensive today. -Continue metoprolol, Norvasc and hydralazine IV as needed with parameters.  Pressure injury Sacrum with stage I, wound care per nursing  Severe protein calorie malnutrition, hypoalbuminemia, generalized deconditioning -Albumin level 1.7today. -Nutritionist consult.  Objective: Vitals:   03/09/19 2250 03/10/19 0556 03/10/19 1052 03/10/19 1055  BP: (!) 146/102 (!) 146/97 129/90   Pulse: (!) 101 (!) 102 (!) 118   Resp: 20 17    Temp: 98.7 F (37.1 C) (!) 97.5 F (36.4 C)  98.6 F (37 C)  TempSrc: Oral Oral  Oral  SpO2: 99% 98%    Weight:  Height:        Intake/Output Summary (Last 24 hours) at 03/10/2019 1628 Last data filed at  03/10/2019 1500 Gross per 24 hour  Intake 2042 ml  Output 3126 ml  Net -1084 ml   Filed Weights   03/06/19 0318 03/07/19 0500 03/08/19 0500  Weight: 58.9 kg 56.9 kg 55 kg    Examination:  General exam: Chronically ill-appearing, cachectic gentleman, appears calm and comfortable  Respiratory system: Few scattered rhonchi with good air entry. Respiratory effort normal. Cardiovascular system: S1 & S2 heard, RRR. No JVD, murmurs, rubs, gallops or clicks. No pedal edema. Gastrointestinal system: Abdomen is nondistended, soft and nontender. No organomegaly or masses felt. Normal bowel sounds heard. Central nervous system: Alert and oriented. No focal neurological deficits. Extremities: Symmetric 5 x 5 power. Psychiatry: Judgement and insight appear normal. Mood & affect appropriate.   DVT prophylaxis: Heparin Code Status: DNR  family Communication: No family at bedside. Disposition Plan: Pending improvement and completion of IV antibiotics.  Consultants:  Cardiology CCM Infectious disease Palliative care Cardiothoracic surgery.  Procedures:   Antimicrobials:  Ancef.  Data Reviewed: I have personally reviewed following labs and imaging studies  CBC: Recent Labs  Lab 03/04/19 0527 03/05/19 0233 03/08/19 0610 03/09/19 0916 03/10/19 1029  WBC 9.0 10.5 8.1 7.2 7.4  HGB 8.1* 10.8* 10.6* 9.0* 8.3*  HCT 26.1* 34.2* 33.5* 28.3* 26.3*  MCV 92.2 91.9 92.0 92.2 91.6  PLT 205 116* 304 349 363   Basic Metabolic Panel: Recent Labs  Lab 03/05/19 0233 03/08/19 0610  NA 136 134*  K 3.9 4.2  CL 105 97*  CO2 24 28  GLUCOSE 158* 111*  BUN 16 11  CREATININE 0.43* 0.39*  CALCIUM 7.4* 8.2*   GFR: Estimated Creatinine Clearance: 87.8 mL/min (A) (by C-G formula based on SCr of 0.39 mg/dL (L)). Liver Function Tests: Recent Labs  Lab 03/08/19 0610  AST 73*  ALT 42  ALKPHOS 92  BILITOT 0.4  PROT 6.5  ALBUMIN 1.7*   No results for input(s): LIPASE, AMYLASE in the last  168 hours. No results for input(s): AMMONIA in the last 168 hours. Coagulation Profile: No results for input(s): INR, PROTIME in the last 168 hours. Cardiac Enzymes: No results for input(s): CKTOTAL, CKMB, CKMBINDEX, TROPONINI in the last 168 hours. BNP (last 3 results) No results for input(s): PROBNP in the last 8760 hours. HbA1C: No results for input(s): HGBA1C in the last 72 hours. CBG: Recent Labs  Lab 03/09/19 1151 03/09/19 1703 03/09/19 2204 03/10/19 0735 03/10/19 1216  GLUCAP 140* 129* 162* 93 112*   Lipid Profile: No results for input(s): CHOL, HDL, LDLCALC, TRIG, CHOLHDL, LDLDIRECT in the last 72 hours. Thyroid Function Tests: No results for input(s): TSH, T4TOTAL, FREET4, T3FREE, THYROIDAB in the last 72 hours. Anemia Panel: No results for input(s): VITAMINB12, FOLATE, FERRITIN, TIBC, IRON, RETICCTPCT in the last 72 hours. Sepsis Labs: No results for input(s): PROCALCITON, LATICACIDVEN in the last 168 hours.  Recent Results (from the past 240 hour(s))  Body fluid culture     Status: None   Collection Time: 03/01/19  1:30 PM   Specimen: Chest; Body Fluid  Result Value Ref Range Status   Specimen Description   Final    CHEST Performed at [facility], [address], [place], [place] [address]    Special Requests   Final    Normal Performed at [facility], [address], [place], [place] [address]    Gram Stain  Final    MODERATE WBC PRESENT, PREDOMINANTLY PMN FEW GRAM POSITIVE COCCI IN CLUSTERS Performed at [facility], [address] [place], [place] [address]    Culture RARE STAPHYLOCOCCUS AUREUS  Final   Report Status 03/04/2019 FINAL  Final   Organism ID, Bacteria STAPHYLOCOCCUS AUREUS  Final      Susceptibility   Staphylococcus aureus - MIC*    CIPROFLOXACIN <=0.5 SENSITIVE Sensitive     ERYTHROMYCIN >=8 RESISTANT Resistant     GENTAMICIN <=0.5 SENSITIVE Sensitive     OXACILLIN 0.5 SENSITIVE  Sensitive     TETRACYCLINE <=1 SENSITIVE Sensitive     VANCOMYCIN <=0.5 SENSITIVE Sensitive     TRIMETH/SULFA <=10 SENSITIVE Sensitive     CLINDAMYCIN <=0.25 SENSITIVE Sensitive     RIFAMPIN <=0.5 SENSITIVE Sensitive     Inducible Clindamycin NEGATIVE Sensitive     * RARE STAPHYLOCOCCUS AUREUS  Culture, blood (routine x 2)     Status: None   Collection Time: 03/02/19  7:10 PM   Specimen: BLOOD RIGHT ARM  Result Value Ref Range Status   Specimen Description   Final    BLOOD RIGHT ARM Performed at [facility], [address] [place], [place] [address]    Special Requests   Final    BOTTLES DRAWN AEROBIC AND ANAEROBIC Blood Culture adequate volume Performed at [facility], [address] [place], [place] [address]    Culture   Final    NO GROWTH 5 DAYS Performed at [facility], [address] [place], [place] [address]    Report Status 03/07/2019 FINAL  Final  Culture, blood (routine x 2)     Status: None   Collection Time: 03/02/19  7:17 PM   Specimen: BLOOD LEFT ARM  Result Value Ref Range Status   Specimen Description   Final    BLOOD LEFT ARM Performed at [facility], [address] [place], [place] [address]    Special Requests   Final    BOTTLES DRAWN AEROBIC ONLY Blood Culture adequate volume Performed at [facility], [address] [place], [place] [address]    Culture   Final    NO GROWTH 5 DAYS Performed at [facility], [address] [place], [place] [address]    Report Status 03/07/2019 FINAL  Final     Radiology Studies: Dg Chest 2 View  Result Date: 03/10/2019 CLINICAL DATA:  Chest tube, left pleural effusion EXAM: CHEST - 2 VIEW COMPARISON:  03/08/2019 FINDINGS: Left-sided pigtail chest tube remains in position about the left lung base, with a small, persistent left apical pneumothorax, not significantly changed compared to prior examination. Small, persistent pleural effusion  component about the left lung base. Multiple pulmonary nodules are again noted bilaterally, some of which are cavitary. The heart and mediastinum are unremarkable. IMPRESSION: 1. Left-sided pigtail chest tube remains in position about the left lung base, with a small, persistent left apical pneumothorax, not significantly changed compared to prior examination. Small, persistent pleural effusion component about the left lung base. 2. Multiple pulmonary nodules are again noted bilaterally, some of which are cavitary. Electronically Signed   By: Lauralyn PrimesAlex  Bibbey M.D.   On: 03/10/2019 13:48    Scheduled Meds:  amLODipine  10 mg Oral Daily   Chlorhexidine Gluconate Cloth  6 each Topical Daily   feeding supplement (ENSURE ENLIVE)  237 mL Oral BID BM   feeding supplement (PRO-STAT SUGAR FREE 64)  30 mL Oral TID   insulin aspart  0-24 Units Subcutaneous TID AC & HS   mouth rinse  15 mL Mouth Rinse BID   metoprolol tartrate  50 mg Oral BID   multivitamin with minerals  1 tablet Oral Daily   oxyCODONE  10 mg Oral Q12H   pantoprazole  40 mg Oral Daily   sodium chloride flush  10-40 mL Intracatheter Q12H   Continuous Infusions:  sodium chloride 250 mL (03/07/19 2139)    ceFAZolin (ANCEF) IV 2 g (03/10/19 0504)   sodium chloride       LOS: 16 days   Time spent: 30 minutes.  Lorella Nimrod, MD [facility] Pager [phone]  If 7PM-7AM, please contact night-coverage [email or web address] Password [facility] 03/10/2019, 4:28 PM   This record has been created using Dragon voice recognition software. Errors have been sought and corrected,but may not always be located. Such creation errors do not reflect on the standard of care.

## 2019-03-10 NOTE — Progress Notes (Signed)
PHARMACY CONSULT NOTE FOR:  OUTPATIENT  PARENTERAL ANTIBIOTIC THERAPY (OPAT)  Indication: Metastatic MSSA B including diskitis, empyema, BP fistula, endocarditis with septic emboli Regimen: Ancef 2gm q8 End date: 04/12/2019  IV antibiotic discharge orders are pended. To discharging provider:  please sign these orders via discharge navigator,  Select New Orders & click on the button choice - Manage This Unsigned Work.     Thank you for allowing pharmacy to be a part of this patient's care.  Minda Ditto PharmD 03/10/2019, 1:11 PM

## 2019-03-11 LAB — BASIC METABOLIC PANEL
Anion gap: 8 (ref 5–15)
BUN: 14 mg/dL (ref 6–20)
CO2: 29 mmol/L (ref 22–32)
Calcium: 8 mg/dL — ABNORMAL LOW (ref 8.9–10.3)
Chloride: 97 mmol/L — ABNORMAL LOW (ref 98–111)
Creatinine, Ser: 0.38 mg/dL — ABNORMAL LOW (ref 0.61–1.24)
GFR calc Af Amer: >60 mL/min (ref >60)
GFR calc non Af Amer: >60 mL/min (ref >60)
Glucose, Bld: 108 mg/dL — ABNORMAL HIGH (ref 70–99)
Potassium: 4.8 mmol/L (ref 3.5–5.1)
Sodium: 134 mmol/L — ABNORMAL LOW (ref 135–145)

## 2019-03-11 LAB — GLUCOSE, CAPILLARY
Glucose-Capillary: 97 mg/dL (ref 70–99)
Glucose-Capillary: 148 mg/dL — ABNORMAL HIGH (ref 70–99)
Glucose-Capillary: 120 mg/dL — ABNORMAL HIGH (ref 70–99)
Glucose-Capillary: 108 mg/dL — ABNORMAL HIGH (ref 70–99)

## 2019-03-11 LAB — CBC
HCT: 26.9 % — ABNORMAL LOW (ref 39.0–52.0)
Hemoglobin: 8.5 g/dL — ABNORMAL LOW (ref 13.0–17.0)
MCH: 29.5 pg (ref 26.0–34.0)
MCHC: 31.6 g/dL (ref 30.0–36.0)
MCV: 93.4 fL (ref 80.0–100.0)
Platelets: 419 10*3/uL — ABNORMAL HIGH (ref 150–400)
RBC: 2.88 MIL/uL — ABNORMAL LOW (ref 4.22–5.81)
RDW: 17.8 % — ABNORMAL HIGH (ref 11.5–15.5)
WBC: 8.4 10*3/uL (ref 4.0–10.5)
nRBC: 0 % (ref 0.0–0.2)

## 2019-03-11 MED ORDER — POLYETHYLENE GLYCOL 3350 17 G PO PACK
17.0000 g | PACK | Freq: Every day | ORAL | Status: DC
  Administered 2019-03-11 – 2019-04-02 (×10): 17 g via ORAL
  Filled 2019-03-11 (×25): qty 1

## 2019-03-11 MED ORDER — OXYCODONE HCL ER 15 MG PO T12A
15.0000 mg | EXTENDED_RELEASE_TABLET | Freq: Two times a day (BID) | ORAL | Status: DC
  Administered 2019-03-11 – 2019-03-23 (×26): 15 mg via ORAL
  Filled 2019-03-11 (×26): qty 1

## 2019-03-11 NOTE — Progress Notes (Signed)
Pt sates he is not a DNR. States when he woke up from being intubated he was a DNR. States he made MD aware. Pt is listed as DNR, notified [facility] provider on call and made them aware of pt wishes. Neomia Dear, RN

## 2019-03-11 NOTE — Progress Notes (Signed)
PROGRESS NOTE    Franklin Anderson  ZOX:[id number] DOB: [birth year] DOA: 02/21/2019 PCP: Julieanne Manson, MD   Brief Narrative:  48 y.o.malewith medical history significant of rheumatic fever, HTN, ongoingIV heroin abuse, presented to the ED on 10/27 with 3 week course of progressively worsening back pain and BLE weakness associated with some bowel incontinence and trouble walking. Last use of heroin was on the day of admission. He was found to be septic with MSSA bacteremia, endocarditis, osteomyelitis, discitis at L4 and L5, septic pulmonary emboli with loculated effusion and pneumothorax and psoas abscesses. On 10/28 was found to be obtunded and dyspneic and was intubated by critical care, extubated on 10/31. Right psoas abscess was aspirated on 10/28, wound care >MSSA. Respiratory culture from 10/28 also grew MSSA. 11/3: Patient underwent TEE and had acute respiratory distress later that evening with stat chest x-ray showing loculated pneumothorax/CT chest indicating empyema/PE/hydropneumothorax. Now seen by PCCM and post chest tube placement.Code status changed to DNR.  Subjective: Patient continued to experience worsening back pain.  Stating that lidocaine patch is not very useful.  He was requesting to increase the dose of Dilaudid. He was also complaining of some constipation.  Denies any nausea/vomiting or abdominal pain.  Assessment & Plan:   Principal Problem:   Sepsis (HCC) Active Problems:   Dyspnea   Heroin use   Endocarditis   Septic pulmonary embolism (HCC)   Osteomyelitis of thoracic spine (HCC)   Osteomyelitis of lumbar spine (HCC)   Acute encephalopathy   AKI (acute kidney injury) (HCC)   Transaminitis   Psoas muscle abscess (HCC)   Pressure injury of skin   Mucus clot in bronchi   Protein-calorie malnutrition, severe   Abnormal echocardiogram   MSSA bacteremia   Acute pulmonary embolism (HCC)   Pleural effusion on left   Empyema, left (HCC)   Chest  tube in place   Pain   Pneumothorax   Fistula  MSSA bacteremia/sepsis with discitis/vertebral osteomyelitis/psoas abscess/septic pulmonary emboli/cavitary lesion, empyema, mural vegetation.Repeat CT chest  shows persistent loculated left hydropneumothorax with decreased in pneumothorax component. Multiple septic emboli throughout both lungs remained unchanged. ID is following-appreciate the recommendations. CT surgery does not think that he is a good candidate for thoracotomy and muscle flap closure,plan is to continue with pigtail empyema tube for a long time. -Continue Ancef for now.By Dr. Zenaida Niece dam patient should complete 6 weeks of Ancef in hospital and then discharged home on Keflex for another 30 days.  Acute hypoxic respiratory failure.Due to sepsis and multiple pulmonary emboli. Patient was extubated on 10/31. No current respiratory distress.  Psoas abscess S/PCT-guided right psoas aspiration by IR on 10/28 yielding 18 mL of purulent fluid, cultures grew MSSA -Pain is better controlled after starting long-acting OxyContin, continue as needed Dilaudid. -Add lidocaine patch for his back pain.  Constipation.  Most likely secondary to opioid use for pain. -MiraLAX daily. -Continue stool softener.  Pericardial effusion.Noted on previous echo. Resolved on repeat CT scan   Acute metabolic encephalopathy. Resolved  Hypernatremia. Resolved  AKI. Resolved  Normocytic anemia.Hemoglobin stable at 8.5 today. No obvious bleeding. -Continue to monitor, transfuse if drops below 7.  Hypertension. Blood pressure mildly elevated today. -Continue metoprolol, Norvasc and hydralazine IV as needed with parameters.  Pressure injury Sacrum with stage I, wound care per nursing  Severe protein calorie malnutrition, hypoalbuminemia, generalized deconditioning -Albumin level 1.7today. -Working on nutrition.  Objective: Vitals:   03/10/19 2037 03/10/19 2330  03/11/19 0519 03/11/19 1338  BP: [place] Kitchen)  143/99 (!) 140/92 (!) 143/88 127/86  Pulse: (!) 115 (!) 108 (!) 112 (!) 102  Resp:  18 19 16   Temp: 99.1 F (37.3 C)  98.3 F (36.8 C) 98.2 F (36.8 C)  TempSrc: Oral Oral Oral Oral  SpO2: 98% 96% 98% 95%  Weight:   53.9 kg   Height:        Intake/Output Summary (Last 24 hours) at 03/11/2019 1650 Last data filed at 03/11/2019 1400 Gross per 24 hour  Intake 1110 ml  Output 2995 ml  Net -1885 ml   Filed Weights   03/07/19 0500 03/08/19 0500 03/11/19 0519  Weight: 56.9 kg 55 kg 53.9 kg    Examination:  General exam: Appears calm and comfortable  Respiratory system: Few scattered rhonchi. Respiratory effort normal. Cardiovascular system: S1 & S2 heard, RRR. No JVD, murmurs, rubs, gallops or clicks. No pedal edema. Gastrointestinal system: Abdomen is nondistended, soft and nontender. No organomegaly or masses felt. Normal bowel sounds heard. Central nervous system: Alert and oriented. No focal neurological deficits. Extremities: Symmetric 5 x 5 power. Skin: No rashes, lesions or ulcers Psychiatry: Judgement and insight appear normal. Mood & affect appropriate.   DVT prophylaxis:Heparin Code Status:DNR  family Communication:No family at bedside. Disposition Plan:Pending improvement and completion of IV antibiotics.  Consultants: Cardiology CCM Infectious disease Palliative care Cardiothoracic surgery.  Procedures:  Antimicrobials: Ancef.  Data Reviewed: I have personally reviewed following labs and imaging studies  CBC: Recent Labs  Lab 03/05/19 0233 03/08/19 0610 03/09/19 0916 03/10/19 1029 03/11/19 1124  WBC 10.5 8.1 7.2 7.4 8.4  HGB 10.8* 10.6* 9.0* 8.3* 8.5*  HCT 34.2* 33.5* 28.3* 26.3* 26.9*  MCV 91.9 92.0 92.2 91.6 93.4  PLT 116* 304 349 363 419*   Basic Metabolic Panel: Recent Labs  Lab 03/05/19 0233 03/08/19 0610 03/11/19 1124  NA 136 134* 134*  K 3.9 4.2 4.8  CL 105 97* 97*  CO2 24 28 29    GLUCOSE 158* 111* 108*  BUN 16 11 14   CREATININE 0.43* 0.39* 0.38*  CALCIUM 7.4* 8.2* 8.0*   GFR: Estimated Creatinine Clearance: 86.1 mL/min (A) (by C-G formula based on SCr of 0.38 mg/dL (L)). Liver Function Tests: Recent Labs  Lab 03/08/19 0610  AST 73*  ALT 42  ALKPHOS 92  BILITOT 0.4  PROT 6.5  ALBUMIN 1.7*   No results for input(s): LIPASE, AMYLASE in the last 168 hours. No results for input(s): AMMONIA in the last 168 hours. Coagulation Profile: No results for input(s): INR, PROTIME in the last 168 hours. Cardiac Enzymes: No results for input(s): CKTOTAL, CKMB, CKMBINDEX, TROPONINI in the last 168 hours. BNP (last 3 results) No results for input(s): PROBNP in the last 8760 hours. HbA1C: No results for input(s): HGBA1C in the last 72 hours. CBG: Recent Labs  Lab 03/10/19 1727 03/10/19 2043 03/11/19 0750 03/11/19 1149 03/11/19 1611  GLUCAP 123* 133* 97 148* 120*   Lipid Profile: No results for input(s): CHOL, HDL, LDLCALC, TRIG, CHOLHDL, LDLDIRECT in the last 72 hours. Thyroid Function Tests: No results for input(s): TSH, T4TOTAL, FREET4, T3FREE, THYROIDAB in the last 72 hours. Anemia Panel: No results for input(s): VITAMINB12, FOLATE, FERRITIN, TIBC, IRON, RETICCTPCT in the last 72 hours. Sepsis Labs: No results for input(s): PROCALCITON, LATICACIDVEN in the last 168 hours.  Recent Results (from the past 240 hour(s))  Culture, blood (routine x 2)     Status: None   Collection Time: 03/02/19  7:10 PM   Specimen: BLOOD RIGHT ARM  Result Value Ref Range Status   Specimen Description   Final    BLOOD RIGHT ARM Performed at [facility], [place] [address] [place], [place] [address]    Special Requests   Final    BOTTLES DRAWN AEROBIC AND ANAEROBIC Blood Culture adequate volume Performed at [facility] [address], [place], [place] [address]    Culture   Final    NO GROWTH 5 DAYS Performed at [facility], [place]  [address], [place], [place] [address]    Report Status 03/07/2019 FINAL  Final  Culture, blood (routine x 2)     Status: None   Collection Time: 03/02/19  7:17 PM   Specimen: BLOOD LEFT ARM  Result Value Ref Range Status   Specimen Description   Final    BLOOD LEFT ARM Performed at [facility], [place] [address], [place], [place] [address]    Special Requests   Final    BOTTLES DRAWN AEROBIC ONLY Blood Culture adequate volume Performed at [place] [address] [place], [place] [address]    Culture   Final    NO GROWTH 5 DAYS Performed at [facility], [place] [address] [place], [place] [address]    Report Status 03/07/2019 FINAL  Final     Radiology Studies: Dg Chest 2 View  Result Date: 03/10/2019 CLINICAL DATA:  Chest tube, left pleural effusion EXAM: CHEST - 2 VIEW COMPARISON:  03/08/2019 FINDINGS: Left-sided pigtail chest tube remains in position about the left lung base, with a small, persistent left apical pneumothorax, not significantly changed compared to prior examination. Small, persistent pleural effusion component about the left lung base. Multiple pulmonary nodules are again noted bilaterally, some of which are cavitary. The heart and mediastinum are unremarkable. IMPRESSION: 1. Left-sided pigtail chest tube remains in position about the left lung base, with a small, persistent left apical pneumothorax, not significantly changed compared to prior examination. Small, persistent pleural effusion component about the left lung base. 2. Multiple pulmonary nodules are again noted bilaterally, some of which are cavitary. Electronically Signed   By: Eddie Candle M.D.   On: 03/10/2019 13:48    Scheduled Meds: . amLODipine  10 mg Oral Daily  . Chlorhexidine Gluconate Cloth  6 each Topical Daily  . feeding supplement (ENSURE ENLIVE)  237 mL Oral BID BM  . feeding supplement (PRO-STAT SUGAR FREE 64)  30 mL Oral TID  . insulin aspart  0-24 Units  Subcutaneous TID AC & HS  . lidocaine  1 patch Transdermal Q24H  . mouth rinse  15 mL Mouth Rinse BID  . metoprolol tartrate  50 mg Oral BID  . multivitamin with minerals  1 tablet Oral Daily  . oxyCODONE  15 mg Oral Q12H  . pantoprazole  40 mg Oral Daily  . polyethylene glycol  17 g Oral Daily  . sodium chloride flush  10-40 mL Intracatheter Q12H   Continuous Infusions: . sodium chloride 250 mL (03/07/19 2139)  .  ceFAZolin (ANCEF) IV 2 g (03/11/19 1400)  . sodium chloride       LOS: 17 days   Time spent: 35 minutes.  Lorella Nimrod, MD [facility] Pager [phone]  If 7PM-7AM, please contact night-coverage [email or web address] Password [facility] 03/11/2019, 4:50 PM   This record has been created using Systems analyst. Errors have been sought and corrected,but may not always be located. Such creation errors do not reflect on the  standard of care.

## 2019-03-11 NOTE — Progress Notes (Signed)
..   NAME:  Franklin Anderson, MRN:  [id number], DOB:  Sep 02, 1970, LOS: 70 ADMISSION DATE:  02/21/2019, CONSULTATION DATE:  02/28/2019 REFERRING MD:  Earnest Conroy MD, CHIEF COMPLAINT:  SOB   Brief History   48 yr old M with PMHx of rheumatic fever, HTN, IV heroin abuse presented on 10/27 w/ back pain and lower ext weakness. 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 Recent TEE showed mobile mass moderate in size located in the mid/ apical  RV that appeared to be attached to a trabeculation. NP overnight evaluated patient at shift change noted to be tachypneic. New CXR 11/3/202  showed left sided loculated pneumothorax. PCCM consulted    Past Medical History  .Marland Kitchen Active Ambulatory Problems    Diagnosis Date Noted  . Shortness of breath 10/01/2015  . Dyspnea 10/01/2015  . HTN (hypertension) 10/01/2015  . Methadone use ([place]) 10/01/2015  . Heroin use 10/01/2015  . Tobacco abuse 10/01/2015  . Drug abuse ([place])   . Hypertension 04/27/2010  . H/O: rheumatic fever    Resolved Ambulatory Problems    Diagnosis Date Noted  . No Resolved Ambulatory Problems   No Additional Past Medical History     Significant Hospital Events    10/27 with worsening back pain and BLE weakness.  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 I nitially evaluated by PCCM on 10/28 when pt became obtunded and dyspneic. Pt was endotracheally intubated for acute hypoxemic respiratory failure. He received a Left IJ CVC on 10/28 with follow up CXR showing no complications. He received IV Antibiotics for metastatic MSSAB infection, initially on Vancomycin and Cefepime tapered to Ancef.  He was evaluated with bedside echo and found to have pericardial effusion without tamponade. Formal TTE showed G!DD w EF 60% and possible mitral valve vegetation.  Pt received TEE on 11/3 at [facility] (w/o intubation, tolerated procedure well) and was found to have  mobile mass moderate in size located  in the mid/ apical  RV that appears to be attached to a trabeculation.  11/3: NP overnight evaluated patient at shift change noted the pt to be tachypneic. Pt reported to NP that he felt more SOB than baseline not not in acute distress with no significant desaturations. A New CXR was completed: results were called to [facility] by NP.  It shows a new left sided loculated pneumothorax.  11/4: Spoke w/ Dr Prescott Gum via phone who reviewed imaging and recommended chest tube placement. Chest tube placed, marked improvement in pneumothorax 11/6: Pleural fluid volume much improved.  Ongoing 1 out of 7 airleak.  Suction from pleural tube increased to 30.  Awaiting cultures, currently has gram-positive cocci in clusters 11/9 still w/ airleak. Got symptomatic (more short of breath) when chest tube sxn was not functioning  11/10 on going BPF. Asked for formal thoracic eval. Concerned that we won't be able to get CT out.  11/12: CT of chest: .Persistent loculated LEFT hydropneumothorax, with decrease in pneumothorax component since previous exam.Septic emboli throughout both lungs, little changed  Pain better controlled.  Ongoing BPF, with velocity of air leak improved.  Consults:  10/28>>Infectious Disease 10/28>>PCCM 10/29>> Cardiology 11/3>>> PCCM re-consult 11/4: Phone consult with thoracic surgery Procedures:  10/28>>CT guided aspiration of right psoas abscesses 10/28>>Endotracheally intubated 10/28>> LIJ CVC placement 10/28>>Bronchoscopy with BAL 10/31>>Extubated to 3L [place] by RT 11/3>>> Transesophageal Echo 11/4: Left-sided chest tube   Micro Data:  Blood cx x 2 on 10/27>>> + MSSA Blood cx  On 10/28>>>>>>+ MSSA Abscess Rt Psoas>>MODERATE STAPHYLOCOCCUS AUREUS 10/28 resp cx on 10/28>>MODERATE STAPHYLOCOCCUS AUREUS  Repeat Blood cx on 10/30>>NGTD (4 days) Pleural fluid 11/4>>>MSSA Antimicrobials: per ID   Abx course--Cefepime10/28 >>10/29 ,Vanc 10/28 >>10/30 Cefazolin 10/30> planned thru Apr 12 2019 with keflex x 30 d to follow    Subjective   Focusing on low back pain/ no sob RA  Objective   Blood pressure 127/86, pulse (!) 102, temperature 98.2 F (36.8 C), temperature source Oral, resp. rate 16, height 6' (1.829 m), weight 53.9 kg, SpO2 95 %. RA        Intake/Output Summary (Last 24 hours) at 03/11/2019 1453 Last data filed at 03/11/2019 1400 Gross per 24 hour  Intake 1590 ml  Output 2995 ml  Net -1405 ml   Filed Weights   03/07/19 0500 03/08/19 0500 03/11/19 0519  Weight: 56.9 kg 55 kg 53.9 kg    Examination:   Thin wm chronically ill appearing and >> stated age Pt alert, approp nad @ 45 degrees  No jvd Oropharynx clear,  mucosa nl Neck supple Lungs with a few scattered exp > insp rhonchi bilaterally/ L chest tube with minimal  bubbling on cough  RRR no s3 or or sign murmur Abd soft with nl  excursion  Extr warm with no edema or clubbing noted Neuro  Sensorium anxious,  no apparent motor deficits   Resolved Hospital Problem list   Uremia and Hypernatremia resolved AKI resolved Extubated on 10/31       Assessment & Plan:   Cavitary PNA w/  Numerous rounded and cavitary fluid-filled lesions in the left lung Left Hydropneumothorax concerning for empyema BPF Septic VTE Pulmonary emboli  MSSA bacteremia/endocarditis w/ mobile mass in the RV further c/b : Discitis, osteomyelitis, bilateral psoas muscle abscesses, septic emboli of lungs and cavitary PNA also small pericardial effusion HTN Cirrhosis with ascites Normocytic Anemia Intravascular hemolytic process>>Schistocytes on smear   pulm problem Discussion MSSA cavitary pneumonia secondary to septic pulmonary emboli, and subsequent  empyema and what bronchopleural fistula S/p ultrasound-guided tube thoracostomy on 11/4 CT chest was reviewed this demonstrated distant loculated left hydropneumothorax with decreasing air component multiple septic emboli throughout both lungs without significant change    Plan/recommendation Cont abx as directed by ID Cont chest tube drainage--> will need this as long has he has BPF and suspect he will need to go have with a hickman valve. Maximize nutrition    We will see again Monday 11/16 - call sooner if needed   Christinia Gully, MD Pulmonary and [place] [phone] After 5:30 PM or weekends, use Beeper [phone]

## 2019-03-12 LAB — GLUCOSE, CAPILLARY
Glucose-Capillary: 115 mg/dL — ABNORMAL HIGH (ref 70–99)
Glucose-Capillary: 233 mg/dL — ABNORMAL HIGH (ref 70–99)
Glucose-Capillary: 145 mg/dL — ABNORMAL HIGH (ref 70–99)
Glucose-Capillary: 109 mg/dL — ABNORMAL HIGH (ref 70–99)

## 2019-03-12 MED ORDER — OXYCODONE-ACETAMINOPHEN 5-325 MG PO TABS
2.0000 | ORAL_TABLET | ORAL | Status: DC | PRN
  Administered 2019-03-12 – 2019-03-16 (×7): 2 via ORAL
  Filled 2019-03-12 (×8): qty 2

## 2019-03-12 NOTE — Progress Notes (Signed)
PROGRESS NOTE    Franklin Anderson  PJK:[id number] DOB: [birth year] DOA: 02/21/2019 PCP: Mack Hook, MD   Brief Narrative:  48 y.o.malewith medical history significant of rheumatic fever, HTN, ongoingIV heroin abuse, presented to the ED on 10/27 with 3 week course of progressively worsening back pain and BLE weakness associated with some bowel incontinence and trouble walking. Last use of heroin was on the day of admission. He was found to be septic with MSSA bacteremia, endocarditis, osteomyelitis, discitis at L4 and L5, septic pulmonary emboli with loculated effusion and pneumothorax and psoas abscesses. On 10/28 was found to be obtunded and dyspneic and was intubated by critical care, extubated on 10/31. Right psoas abscess was aspirated on 10/28, wound care >MSSA. Respiratory culture from 10/28 also grew MSSA. 11/3: Patient underwent TEE and had acute respiratory distress later that evening with stat chest x-ray showing loculated pneumothorax/CT chest indicating empyema/PE/hydropneumothorax. Now seen by PCCM and post chest tube placement.Code status changed to DNR.  Subjective: Per patient his back pain is little improved today.  He was using Dilaudid every 2 hour.  Have a long discussion regarding his pain management.  Told the patient that I have increased the dose of his OxyContin and can increase Percocet and he should try oral before getting Dilaudid.  Patient seems agreeable.  Assessment & Plan:   Principal Problem:   Sepsis ([place]) Active Problems:   Dyspnea   Heroin use   Endocarditis   Septic pulmonary embolism (HCC)   Osteomyelitis of thoracic spine (HCC)   Osteomyelitis of lumbar spine (HCC)   Acute encephalopathy   AKI (acute kidney injury) ([place])   Transaminitis   Psoas muscle abscess (HCC)   Pressure injury of skin   Mucus clot in bronchi   Protein-calorie malnutrition, severe   Abnormal echocardiogram   MSSA bacteremia   Acute pulmonary embolism (HCC)    Pleural effusion on left   Empyema, left (HCC)   Chest tube in place   Pain   Pneumothorax   Fistula  MSSA bacteremia/sepsis with discitis/vertebral osteomyelitis/psoas abscess/septic pulmonary emboli/cavitary lesion, empyema, mural vegetation.Repeat CT chest  shows persistent loculated left hydropneumothorax with decreased in pneumothorax component. Multiple septic emboli throughout both lungs remained unchanged. ID is following-appreciate the recommendations. CT surgery does not think that he is a good candidate for thoracotomy and muscle flap closure,plan is to continue with pigtail empyema tube for a long time. -Continue Ancef for now.By Dr. Lucianne Lei dam patient should complete 6 weeks of Ancef in hospital and then discharged home on Keflex for another 30 days. Ancef was started on 02/24/2019.  Acute hypoxic respiratory failure.Due to sepsis and multiple pulmonary emboli. Patient was extubated on 10/31. No current respiratory distress.  Psoas abscess S/PCT-guided right psoas aspiration by IR on 10/28 yielding 18 mL of purulent fluid, cultures grew MSSA -Pain is better controlled after starting long-acting OxyContin,  -Increase the dose of Percocet to 2 tablets every 6 4 hourly as needed. -continue as needed Dilaudid.  We should try it p.o. before giving Dilaudid.  Nursing staff made aware of that. -Add lidocaine patch for his back pain.  Constipation.  Most likely secondary to opioid use for pain. -MiraLAX daily. -Continue stool softener.  Pericardial effusion.Noted on previous echo. Resolved on repeat CT scan   Acute metabolic encephalopathy. Resolved  Hypernatremia. Resolved  AKI. Resolved  Normocytic anemia.Hemoglobin stable at 8.5 today. No obvious bleeding. -Continue to monitor, transfuse if drops below 7.  Hypertension. Blood pressure mildly elevated today. -Continue  metoprolol, Norvasc and hydralazine IV as needed with parameters.   Pressure injury Sacrum with stage I, wound care per nursing  Severe protein calorie malnutrition, hypoalbuminemia, generalized deconditioning -Albumin level 1.7today. -Working on nutrition.  Objective: Vitals:   03/11/19 1952 03/12/19 0316 03/12/19 0545 03/12/19 1352  BP: (!) 142/95 (!) 126/92 (!) 143/93 126/89  Pulse: (!) 123 99 (!) 106 (!) 105  Resp:  16 (!) 22 16  Temp:  98.9 F (37.2 C) 99 F (37.2 C) 98.7 F (37.1 C)  TempSrc:  Oral Oral Oral  SpO2: 96% 99% 95% 97%  Weight:   52.1 kg   Height:        Intake/Output Summary (Last 24 hours) at 03/12/2019 1626 Last data filed at 03/12/2019 1300 Gross per 24 hour  Intake 220 ml  Output 2400 ml  Net -2180 ml   Filed Weights   03/08/19 0500 03/11/19 0519 03/12/19 0545  Weight: 55 kg 53.9 kg 52.1 kg    Examination:  General exam: Appears calm and comfortable  Respiratory system: Few scattered rhonchi. Respiratory effort normal. Cardiovascular system: S1 & S2 heard, RRR. No JVD, murmurs, rubs, gallops or clicks. No pedal edema. Gastrointestinal system: Abdomen is nondistended, soft and nontender. No organomegaly or masses felt. Normal bowel sounds heard. Central nervous system: Alert and oriented. No focal neurological deficits. Extremities: Symmetric 5 x 5 power. Psychiatry: Judgement and insight appear normal. Mood & affect appropriate.   DVT prophylaxis:Heparin Code Status:DNR  family Communication:No family at bedside. Disposition Plan:Pending improvement and completion of IV antibiotics.  Consultants: Cardiology CCM Infectious disease Palliative care Cardiothoracic surgery.  Procedures:  Antimicrobials: Ancef.  Day 17  Data Reviewed: I have personally reviewed following labs and imaging studies  CBC: Recent Labs  Lab 03/08/19 0610 03/09/19 0916 03/10/19 1029 03/11/19 1124  WBC 8.1 7.2 7.4 8.4  HGB 10.6* 9.0* 8.3* 8.5*  HCT 33.5* 28.3* 26.3* 26.9*  MCV 92.0 92.2 91.6 93.4  PLT  304 349 363 419*   Basic Metabolic Panel: Recent Labs  Lab 03/08/19 0610 03/11/19 1124  NA 134* 134*  K 4.2 4.8  CL 97* 97*  CO2 28 29  GLUCOSE 111* 108*  BUN 11 14  CREATININE 0.39* 0.38*  CALCIUM 8.2* 8.0*   GFR: Estimated Creatinine Clearance: 83.2 mL/min (A) (by C-G formula based on SCr of 0.38 mg/dL (L)). Liver Function Tests: Recent Labs  Lab 03/08/19 0610  AST 73*  ALT 42  ALKPHOS 92  BILITOT 0.4  PROT 6.5  ALBUMIN 1.7*   No results for input(s): LIPASE, AMYLASE in the last 168 hours. No results for input(s): AMMONIA in the last 168 hours. Coagulation Profile: No results for input(s): INR, PROTIME in the last 168 hours. Cardiac Enzymes: No results for input(s): CKTOTAL, CKMB, CKMBINDEX, TROPONINI in the last 168 hours. BNP (last 3 results) No results for input(s): PROBNP in the last 8760 hours. HbA1C: No results for input(s): HGBA1C in the last 72 hours. CBG: Recent Labs  Lab 03/11/19 1149 03/11/19 1611 03/11/19 2034 03/12/19 0749 03/12/19 1202  GLUCAP 148* 120* 108* 115* 233*   Lipid Profile: No results for input(s): CHOL, HDL, LDLCALC, TRIG, CHOLHDL, LDLDIRECT in the last 72 hours. Thyroid Function Tests: No results for input(s): TSH, T4TOTAL, FREET4, T3FREE, THYROIDAB in the last 72 hours. Anemia Panel: No results for input(s): VITAMINB12, FOLATE, FERRITIN, TIBC, IRON, RETICCTPCT in the last 72 hours. Sepsis Labs: No results for input(s): PROCALCITON, LATICACIDVEN in the last 168 hours.  Recent Results (from  the past 240 hour(s))  Culture, blood (routine x 2)     Status: None   Collection Time: 03/02/19  7:10 PM   Specimen: BLOOD RIGHT ARM  Result Value Ref Range Status   Specimen Description   Final    BLOOD RIGHT ARM Performed at [facility], [address] [place], [place] [address]    Special Requests   Final    BOTTLES DRAWN AEROBIC AND ANAEROBIC Blood Culture adequate volume Performed at [facility], [address] [place], [place] [address]    Culture   Final    NO GROWTH 5 DAYS Performed at [facility], [address] [place], [place] [address]    Report Status 03/07/2019 FINAL  Final  Culture, blood (routine x 2)     Status: None   Collection Time: 03/02/19  7:17 PM   Specimen: BLOOD LEFT ARM  Result Value Ref Range Status   Specimen Description   Final    BLOOD LEFT ARM Performed at [facility], [address] [place], [place] [address]    Special Requests   Final    BOTTLES DRAWN AEROBIC ONLY Blood Culture adequate volume Performed at [facility], [address] [place], [place] [address]    Culture   Final    NO GROWTH 5 DAYS Performed at [facility], [address] [place], [place] [address]    Report Status 03/07/2019 FINAL  Final     Radiology Studies: No results found.  Scheduled Meds: . amLODipine  10 mg Oral Daily  . Chlorhexidine Gluconate Cloth  6 each Topical Daily  . feeding supplement (ENSURE ENLIVE)  237 mL Oral BID BM  . feeding supplement (PRO-STAT SUGAR FREE 64)  30 mL Oral TID  . insulin aspart  0-24 Units Subcutaneous TID AC & HS  . lidocaine  1 patch Transdermal Q24H  . mouth rinse  15 mL Mouth Rinse BID  . metoprolol tartrate  50 mg Oral BID  . multivitamin with minerals  1 tablet Oral Daily  . oxyCODONE  15 mg Oral Q12H  . pantoprazole  40 mg Oral Daily  . polyethylene glycol  17 g Oral Daily  . sodium chloride flush  10-40 mL Intracatheter Q12H   Continuous Infusions: . sodium chloride 250 mL (03/07/19 2139)  .  ceFAZolin (ANCEF) IV 2 g (03/12/19 1355)  . sodium chloride       LOS: 18 days   Time spent: 35 minutes.  Arnetha CourserSumayya Luvina Poirier, MD [facility] Pager [phone]  If 7PM-7AM, please contact night-coverage [email or web address] Password [email or web address] 03/12/2019, 4:26 PM   This record has been created using Dragon voice recognition software. Errors have been sought and corrected,but may not  always be located. Such creation errors do not reflect on the standard of care.

## 2019-03-12 NOTE — Progress Notes (Signed)
Pt continues with same VS. MD aware.

## 2019-03-13 ENCOUNTER — Inpatient Hospital Stay (HOSPITAL_COMMUNITY): Payer: Self-pay

## 2019-03-13 LAB — BASIC METABOLIC PANEL
Anion gap: 7 (ref 5–15)
BUN: 14 mg/dL (ref 6–20)
CO2: 30 mmol/L (ref 22–32)
Calcium: 8.2 mg/dL — ABNORMAL LOW (ref 8.9–10.3)
Chloride: 96 mmol/L — ABNORMAL LOW (ref 98–111)
Creatinine, Ser: 0.38 mg/dL — ABNORMAL LOW (ref 0.61–1.24)
GFR calc Af Amer: >60 mL/min (ref >60)
GFR calc non Af Amer: >60 mL/min (ref >60)
Glucose, Bld: 122 mg/dL — ABNORMAL HIGH (ref 70–99)
Potassium: 4.1 mmol/L (ref 3.5–5.1)
Sodium: 133 mmol/L — ABNORMAL LOW (ref 135–145)

## 2019-03-13 LAB — GLUCOSE, CAPILLARY
Glucose-Capillary: 96 mg/dL (ref 70–99)
Glucose-Capillary: 142 mg/dL — ABNORMAL HIGH (ref 70–99)
Glucose-Capillary: 134 mg/dL — ABNORMAL HIGH (ref 70–99)
Glucose-Capillary: 122 mg/dL — ABNORMAL HIGH (ref 70–99)

## 2019-03-13 LAB — CBC
HCT: 26.1 % — ABNORMAL LOW (ref 39.0–52.0)
Hemoglobin: 8 g/dL — ABNORMAL LOW (ref 13.0–17.0)
MCH: 28.9 pg (ref 26.0–34.0)
MCHC: 30.7 g/dL (ref 30.0–36.0)
MCV: 94.2 fL (ref 80.0–100.0)
Platelets: 434 10*3/uL — ABNORMAL HIGH (ref 150–400)
RBC: 2.77 MIL/uL — ABNORMAL LOW (ref 4.22–5.81)
RDW: 17.4 % — ABNORMAL HIGH (ref 11.5–15.5)
WBC: 7.7 10*3/uL (ref 4.0–10.5)
nRBC: 0 % (ref 0.0–0.2)

## 2019-03-13 IMAGING — DX DG CHEST 1V PORT
1 series · 1 of 1 positions shown · non-contrast
Comparison: Chest radiograph dated [DATE].

CLINICAL DATA: [age] year male with shortness of breath and
left-sided pneumothorax. History of septic emboli.

EXAM:
PORTABLE CHEST 1 VIEW

[chest ap]
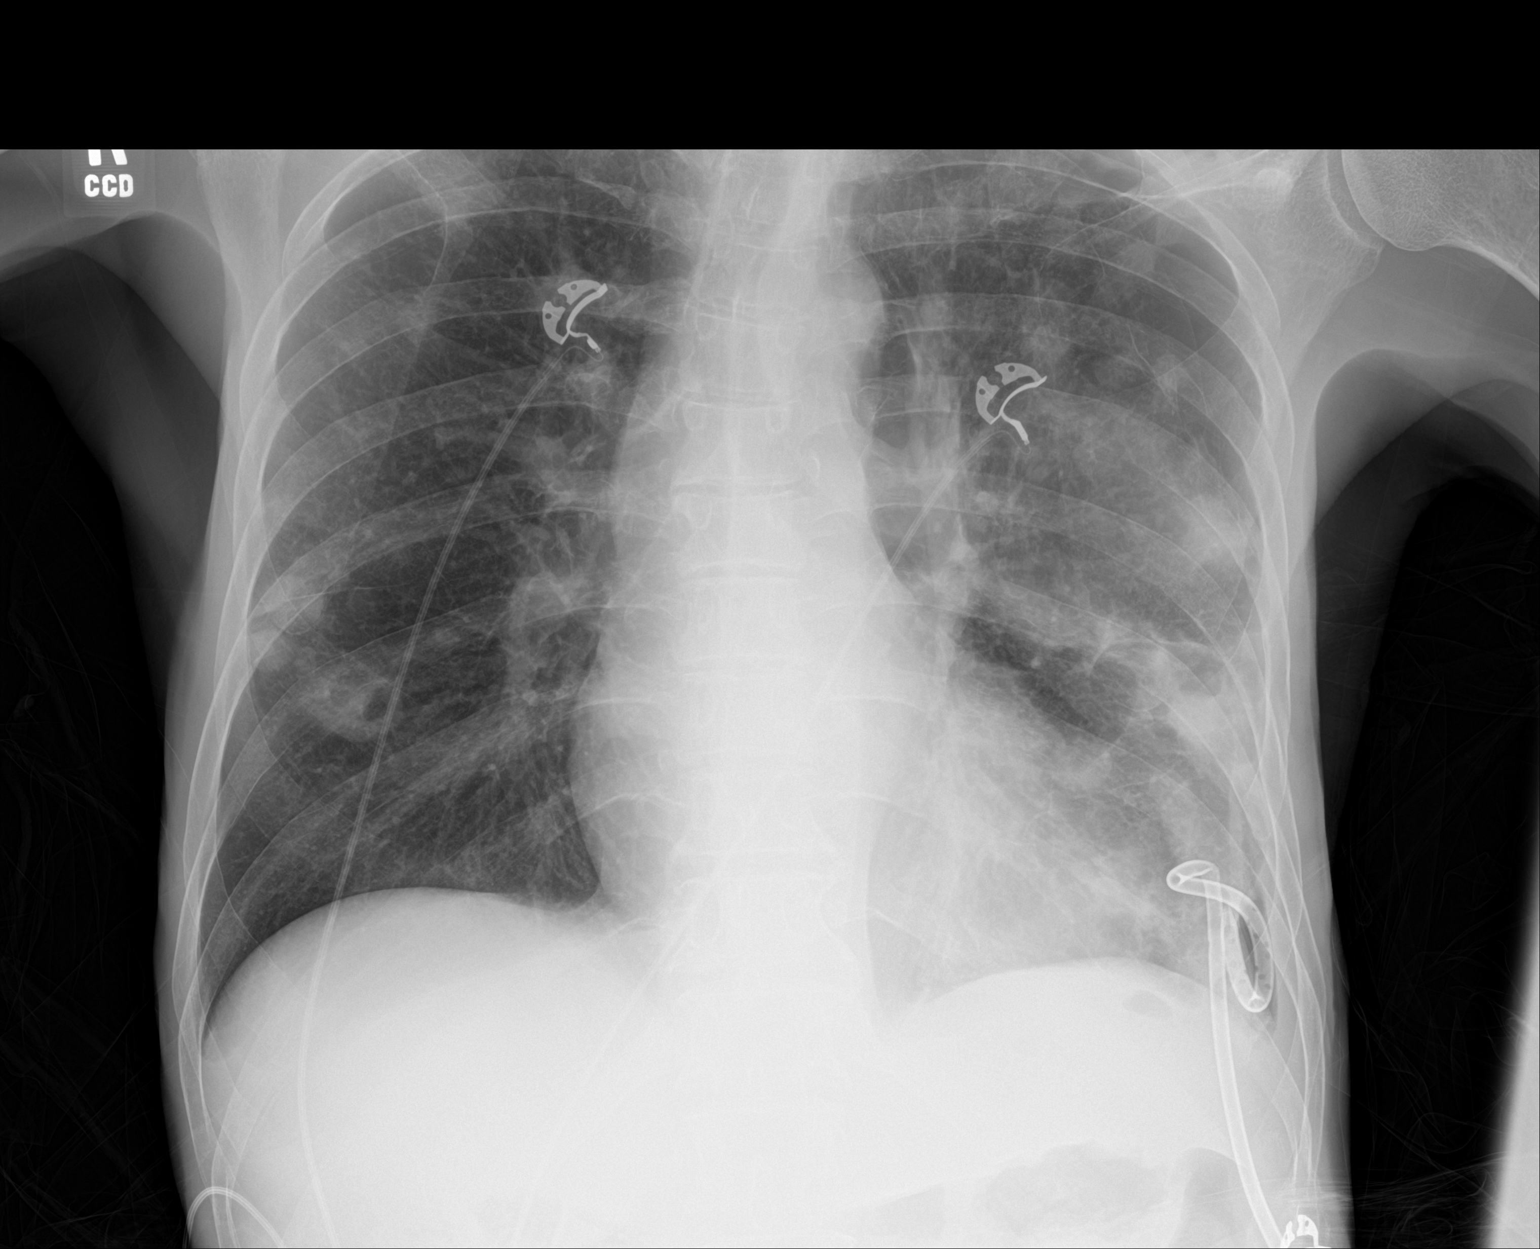

[1 of 1 positions shown; findings below may reference images not displayed]

FINDINGS: Left-sided chest tube in similar position. Overall slight interval
increase in the size of the left pneumothorax since the radiograph
of [DATE]. Continued follow-up recommended. No mediastinal
shift. Bilateral nodular and confluent airspace densities similar to
prior radiograph. There is a small left pleural effusion. Stable
cardiac silhouette. No acute osseous pathology.
IMPRESSION: 1. Slight interval increase in the size of the left pneumothorax
since the radiograph of [DATE]. Continued follow-up recommended.
2. Bilateral nodular and confluent airspace densities similar to
prior radiograph.

## 2019-03-13 NOTE — Progress Notes (Signed)
..   NAME:  Franklin Anderson, MRN:  [id number], DOB:  12-28-1970, LOS: 4 ADMISSION DATE:  02/21/2019, CONSULTATION DATE:  02/28/2019 REFERRING MD:  Earnest Conroy MD, CHIEF COMPLAINT:  SOB   Brief History   48 yr old M with PMHx of rheumatic fever, HTN, IV heroin abuse presented on 10/27 w/ back pain and lower ext weakness. 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 Recent TEE showed mobile mass moderate in size located in the mid/ apical  RV that appeared to be attached to a trabeculation. NP overnight evaluated patient at shift change noted to be tachypneic. New CXR 11/3/202  showed left sided loculated pneumothorax. PCCM consulted    Past Medical History  .Marland Kitchen Active Ambulatory Problems    Diagnosis Date Noted  . Shortness of breath 10/01/2015  . Dyspnea 10/01/2015  . HTN (hypertension) 10/01/2015  . 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 Heroin use 10/01/2015  . Tobacco abuse 10/01/2015  . Drug abuse ([place])   . Hypertension 04/27/2010  . H/O: rheumatic fever    Resolved Ambulatory Problems    Diagnosis Date Noted  . No Resolved Ambulatory Problems   No Additional Past Medical History     Significant Hospital Events   10/27 Admit with worsening back pain and BLE weakness.  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 I 10/28 PCCM evaluation when pt became obtunded and dyspneic. Pt was endotracheally intubated for acute hypoxemic respiratory failure. He received a Left IJ CVC.  He received IV Antibiotics for metastatic MSSAB infection, initially on Vancomycin and Cefepime tapered to Ancef.  He was evaluated with bedside echo and found to have pericardial effusion without tamponade. Formal TTE showed G!DD w EF 60% and possible mitral valve vegetation.  11/03 TEE at [facility] (w/o intubation, tolerated procedure well) and was found to have  mobile mass moderate in size located in the mid/ apical  RV that appears to be attached to a trabeculation.   11/03 NP overnight evaluated patient at shift change noted the pt to be tachypneic. CXR was completed: results were called to [facility] by NP.  It shows a new left sided loculated pneumothorax.  11/04: Spoke w/ Dr Prescott Gum via phone who reviewed imaging and recommended chest tube placement. Chest tube placed, marked improvement in pneumothorax 11/6: Pleural fluid volume much improved.  Ongoing 1 out of 7 airleak.  Suction from pleural tube increased to 30.  Awaiting cultures, currently has gram-positive cocci in clusters 11/09 still w/ airleak. Got symptomatic (more short of breath) when chest tube sxn was not functioning  11/10 on going BPF. Asked for formal thoracic eval. Concerned that we won't be able to get CT out.  11/12: CT of chest: .Persistent loculated LEFT hydropneumothorax, with decrease in pneumothorax component since previous exam.Septic emboli throughout both lungs, little changed  Pain better controlled. Ongoing BPF, with velocity of air leak improved.  11/16 CT placed to water seal   Consults:  10/28 Infectious Disease 10/28 PCCM 10/29 Cardiology 11/03 PCCM re-consult 11/04 Phone consult with thoracic surgery  Procedures:  10/28 CT guided aspiration of right psoas abscesses 10/28 Endotracheally intubated 10/28  LIJ CVC placement 10/28 Bronchoscopy with BAL 10/31 Extubated to 3L [place] by RT 11/3  Transesophageal Echo 11/4 Left-sided chest tube  Micro Data:  BCx2 10/27 >> + MSSA BCx2 10/28 >> + MSSA Abscess Rt Psoas 10/28 >> MSSA Resp Culture 10/28 >> MSSA BCx2 10/30 >> MSSA Pleural fluid 11/4 >> MSSA BCx2 11/5 >>  negative   Antimicrobials: per ID   Cefepime10/28 >>10/29 , Vanc 10/28 >>10/30 Cefazolin 10/30 >> planned thru Apr 12 2019 with keflex x 30 d to follow    Subjective   C/O low back pain.  Denies SOB. Chest tube on water seal.   Objective   Blood pressure (!) 128/96, pulse (!) 109, temperature 98 F (36.7 C), temperature source Oral, resp. rate 20,  height 6' (1.829 m), weight 51.3 kg, SpO2 97 %. RA        Intake/Output Summary (Last 24 hours) at 03/13/2019 1004 Last data filed at 03/13/2019 0959 Gross per 24 hour  Intake 1766.33 ml  Output 2750 ml  Net -983.67 ml   Filed Weights   03/11/19 0519 03/12/19 0545 03/13/19 0500  Weight: 53.9 kg 52.1 kg 51.3 kg    Examination: General: thin/cachectic male lying in bed in NAD HEENT: MM pink/moist, temporal wasting, poor dentition  Neuro: AAOx4, speech clear, MAE, generalized weakness  CV: s1s2 rrr, no m/r/g PULM:  Even/non-labored, lungs bilaterally clear, left chest tube in place, no leak, to water seal  GI: soft, bsx4 active  Extremities: warm/dry, no edema  Skin: no rashes or lesions, old IV drug use sites noted on [facility] Problem list   Uremia and Hypernatremia resolved AKI resolved Acute Respiratory Failure s/p Mechanical Ventilation, Extubated on 10/31   Assessment & Plan:   Left Cavitary PNA with Numerous Rounded and Cavitary fluid-filled lesions  Hydropneumothorax concerning for Empyema BPF Septic VTE Pulmonary Emboli  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 CT Chest 11/11 demonstrated distant loculated left hydropneumothorax with decreasing air component multiple septic emboli throughout both lungs without significant change. Status post ultrasound guided tube thoracostomy on 11/14.   P: Continue antibiotics per ID  Place chest tube to water seal Follow up intermittent CXR Maximize nutritional support  Pulmonary hygiene - IS, mobilize  Defer back pain work up to  primary MD.   PCCM will continue to follow.   Noe Gens, NP-C [facility] 03/13/2019, 10:04 AM

## 2019-03-13 NOTE — Progress Notes (Signed)
OT Cancellation Note  Patient Details Name: Franklin Anderson MRN: [id number] DOB: 1971/03/06   Cancelled Treatment:    Reason Eval/Treat Not Completed: Other (comment)  Pt refused and ask OT to return later in day.  Will return later in day or next day  Kari Baars, [place] [phone] Office- [phone], Edwena Felty D 03/13/2019, 11:14 AM

## 2019-03-13 NOTE — Progress Notes (Signed)
PT Cancellation Note  Patient Details Name: GASPARE NETZEL MRN: [id number] DOB: March 25, 1971   Cancelled Treatment:    Reason Eval/Treat Not Completed: Patient declined, no reason specified - Pt refuses PT at this time, states "I have been doing these (therabands) all morning, I want some pain medicine, and I want to eat". PT to check back as schedule allows.  [place] Pager [phone]  Office [phone]    Roxine Caddy D Elonda Husky 03/13/2019, 12:21 PM

## 2019-03-13 NOTE — Progress Notes (Signed)
..   NAME:  Franklin Anderson, MRN:  [id number], DOB:  [birth year], LOS: 59 ADMISSION DATE:  02/21/2019, CONSULTATION DATE:  02/28/2019 REFERRING MD:  Earnest Conroy MD, CHIEF COMPLAINT:  SOB   Brief History   48 yr old M with PMHx of rheumatic fever, HTN, IV heroin abuse presented on 10/27 w/ back pain and lower ext weakness. 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 Recent TEE showed mobile mass moderate in size located in the mid/ apical  RV that appeared to be attached to a trabeculation. New CXR 11/3/202  showed left sided loculated pneumothorax. PCCM consulted    Past Medical History  .Marland Kitchen Active Ambulatory Problems    Diagnosis Date Noted  . Shortness of breath 10/01/2015  . Dyspnea 10/01/2015  . HTN (hypertension) 10/01/2015  . 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 Heroin use 10/01/2015  . Tobacco abuse 10/01/2015  . Drug abuse ([place])   . Hypertension 04/27/2010  . H/O: rheumatic fever    Resolved Ambulatory Problems    Diagnosis Date Noted  . No Resolved Ambulatory Problems   No Additional Past Medical History     Significant Hospital Events    10/27 with worsening back pain and BLE weakness.  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 I nitially evaluated by PCCM on 10/28 when pt became obtunded and dyspneic. Pt was endotracheally intubated for acute hypoxemic respiratory failure. He received a Left IJ CVC on 10/28 with follow up CXR showing no complications. He received IV Antibiotics for metastatic MSSAB infection, initially on Vancomycin and Cefepime tapered to Ancef.  He was evaluated with bedside echo and found to have pericardial effusion without tamponade. Formal TTE showed G!DD w EF 60% and possible mitral valve vegetation.  Pt received TEE on 11/3 at [facility] (w/o intubation, tolerated procedure well) and was found to have  mobile mass moderate in size located in the mid/ apical  RV that appears to be attached to a trabeculation.   11/3: NP overnight evaluated patient at shift change noted the pt to be tachypneic. Pt reported to NP that he felt more SOB than baseline not not in acute distress with no significant desaturations. A New CXR was completed: results were called to [facility] by NP.  It shows a new left sided loculated pneumothorax.  11/4: Spoke w/ Dr Prescott Gum via phone who reviewed imaging and recommended chest tube placement. Chest tube placed, marked improvement in pneumothorax 11/6: Pleural fluid volume much improved.  Ongoing 1 out of 7 airleak.  Suction from pleural tube increased to 30.  Awaiting cultures, currently has gram-positive cocci in clusters 11/9 still w/ airleak. Got symptomatic (more short of breath) when chest tube sxn was not functioning  11/10 on going BPF. Asked for formal thoracic eval. Concerned that we won't be able to get CT out.  11/12: CT of chest: .Persistent loculated LEFT hydropneumothorax, with decrease in pneumothorax component since previous exam.Septic emboli throughout both lungs, little changed  Pain better controlled.  Ongoing BPF, with velocity of air leak improved.  Consults:  10/28>>Infectious Disease 10/28>>PCCM 10/29>> Cardiology 11/3>>> PCCM re-consult 11/4: Phone consult with thoracic surgery Procedures:  10/28>>CT guided aspiration of right psoas abscesses 10/28>>Endotracheally intubated 10/28>> LIJ CVC placement 10/28>>Bronchoscopy with BAL 10/31>>Extubated to 3L [place] by RT 11/3>>> Transesophageal Echo 11/4: Left-sided chest tube   Micro Data:  Blood cx x 2 on 10/27>>> + MSSA Blood cx  On 10/28>>>>>>+ MSSA Abscess Rt Psoas>>MODERATE STAPHYLOCOCCUS AUREUS 10/28 resp cx  on 10/28>>MODERATE STAPHYLOCOCCUS AUREUS  Repeat Blood cx on 10/30>>NGTD (4 days) Pleural fluid 11/4>>>MSSA Antimicrobials: per ID   Abx course--Cefepime10/28 >>10/29 ,Vanc 10/28 >>10/30 Cefazolin 10/30> planned thru Apr 12 2019 with keflex x 30 d to follow    Subjective     Complains of  whole body pain, no chest pain specifically or dyspnea  Objective   Blood pressure (!) 128/96, pulse (!) 109, temperature 98 F (36.7 C), temperature source Oral, resp. rate 20, height 6' (1.829 m), weight 51.3 kg, SpO2 97 %. RA        Intake/Output Summary (Last 24 hours) at 03/13/2019 1107 Last data filed at 03/13/2019 1013 Gross per 24 hour  Intake 1646.33 ml  Output 2950 ml  Net -1303.67 ml   Filed Weights   03/11/19 0519 03/12/19 0545 03/13/19 0500  Weight: 53.9 kg 52.1 kg 51.3 kg    Examination:   Thin wm chronically ill appearing , no distress, talking on the phone No jvd Oropharynx clear,  mucosa nl Neck supple Lungs decreased breath sounds left/ L chest tube without respiratory variation or air leak RRR no s3 or or sign murmur Abd soft with nl  excursion  Extr warm with no edema or clubbing noted Neuro  Sensorium anxious,  no apparent motor deficits   Resolved Hospital Problem list   Uremia and Hypernatremia resolved AKI resolved Extubated on 10/31       Assessment & Plan:   Cavitary PNA w/  Numerous rounded and cavitary fluid-filled lesions in the left lung Left Hydropneumothorax concerning for empyema BPF Septic VTE Pulmonary emboli  MSSA bacteremia/endocarditis w/ mobile mass in the RV further c/b : Discitis, osteomyelitis, bilateral psoas muscle abscesses, septic emboli of lungs and cavitary PNA also small pericardial effusion HTN Cirrhosis with ascites Normocytic Anemia Intravascular hemolytic process>>Schistocytes on smear   pulm problem Discussion MSSA cavitary pneumonia secondary to septic pulmonary emboli, and subsequent  empyema and what bronchopleural fistula S/p ultrasound-guided tube thoracostomy on 11/4 CT chest demonstrated distant loculated left hydropneumothorax with decreasing air component multiple septic emboli throughout both lungs without significant change   Left hydropneumothorax-chest tube will be placed to waterseal and obtain  chest x-ray, he still has purulent drainage We will ask RN to flush every shift No respiratory variation today-this could either mean that chest tube is blocked or pneumothorax is resolved or chest tube is migrated.   Kara Mead MD. [place]. [facility]  If no response to pager , please call [phone]   03/13/2019

## 2019-03-13 NOTE — Progress Notes (Signed)
Physical Therapy Treatment Patient Details Name: Franklin Anderson MRN: [id number] DOB: [birth year] Today's Date: 03/13/2019    History of Present Illness Pt is 48 y/o M with PMH: IVDA (heroine), who presented with worsening back pain and B LE weakness. 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 Pt was intubated 10/28, extubated 10/31.    PT Comments    Pt appears very irritated to have PT today, cursing multiple times and rude to PT. PT educated pt again on the importance of PT to progress mobility, but pt is not very receptive to this. Pt tolerated sit to stand x1, and when PT attempted to instruct pt in repeated sit to stands for LE strengthening and in preparation for mobility progression, pt adamantly declines. Pt tolerated limited LE exercises in sitting and standing, pt encouraged to perform sitting exercises initiated today on his own time. PT downgraded recommendation and frequency given pt presentation today, PT to continue to follow acutely.   HRmax 122 bpm during transfer    Follow Up Recommendations  SNF     Equipment Recommendations  Other (comment)(TBD)    Recommendations for Other Services       Precautions / Restrictions Precautions Precautions: Fall Precaution Comments: chest tube Restrictions Weight Bearing Restrictions: No    Mobility  Bed Mobility Overal bed mobility: Needs Assistance             General bed mobility comments: pt up in chair upon arrival to room, requests staying in recliner post-PT.  Transfers Overall transfer level: Needs assistance Equipment used: Rolling walker (2 wheeled) Transfers: Sit to/from Stand Sit to Stand: Mod assist         General transfer comment: mod assist for power up, steadying. Verbal cuing for scooting to edge of chair, use of UEs to push to standing, weight shifting to bring hands to and from RW. Pt with very increased time to perform, max  verbal encouragement.  Ambulation/Gait Ambulation/Gait assistance: (NT - pt declines ambulation attempt)               Stairs             Wheelchair Mobility    Modified Rankin (Stroke Patients Only)       Balance Overall balance assessment: Needs assistance Sitting-balance support: Bilateral upper extremity supported Sitting balance-Leahy Scale: Fair Sitting balance - Comments: use of bilateral UE and LE support to perform dynamic sitting (weight shifting to get to edge of chair)   Standing balance support: Bilateral upper extremity supported Standing balance-Leahy Scale: Poor Standing balance comment: reliant on external support and PT for static and dynamic standing.                            Cognition Arousal/Alertness: Awake/alert Behavior During Therapy: WFL for tasks assessed/performed Overall Cognitive Status: No family/caregiver present to determine baseline cognitive functioning                                 General Comments: pt irritated with PT during session, cursing multiple times. PT educated pt on importance of mobility today to progress mobility overall, pt does not appear to understand this as pt with continued frustration directed towards PT.      Exercises General Exercises - Lower Extremity Long Arc Quad: AROM;Both;10 reps;Seated Hip ABduction/ADduction: AROM;AAROM;Both;10 reps;Seated Hip Flexion/Marching: AROM;Both;10 reps;Standing  General Comments        Pertinent Vitals/Pain Pain Assessment: Faces Faces Pain Scale: Hurts whole lot Pain Location: back, with sit to stand Pain Descriptors / Indicators: Discomfort;Guarding;Sharp;Grimacing Pain Intervention(s): Limited activity within patient's tolerance;Monitored during session;Premedicated before session    Home Living                      Prior Function            PT Goals (current goals can now be found in the care plan section) Acute  Rehab PT Goals Patient Stated Goal: To get stronger and make better decisions PT Goal Formulation: With patient Time For Goal Achievement: 03/20/19 Potential to Achieve Goals: Fair Progress towards PT goals: Not progressing toward goals - comment(pt limiting himself, very difficult to motivate)    Frequency    Min 2X/week      PT Plan Discharge plan needs to be updated;Frequency needs to be updated    Co-evaluation              AM-PAC PT "6 Clicks" Mobility   Outcome Measure  Help needed turning from your back to your side while in a flat bed without using bedrails?: A Lot Help needed moving from lying on your back to sitting on the side of a flat bed without using bedrails?: A Lot Help needed moving to and from a bed to a chair (including a wheelchair)?: A Lot Help needed standing up from a chair using your arms (e.g., wheelchair or bedside chair)?: A Lot Help needed to walk in hospital room?: Total Help needed climbing 3-5 steps with a railing? : Total 6 Click Score: 10    End of Session   Activity Tolerance: Patient limited by pain;Other (comment)(self-limits) Patient left: with call bell/phone within reach;in chair(pt up in chair without chair alarm upon PT arrival) Nurse Communication: Mobility status PT Visit Diagnosis: Muscle weakness (generalized) (M62.81);Difficulty in walking, not elsewhere classified (R26.2)     Time: 6222-9798 PT Time Calculation (min) (ACUTE ONLY): 13 min  Charges:  $Therapeutic Exercise: 8-22 mins                     Benjamin Casanas E, PT Acute Rehabilitation Services Pager [phone]  Office [phone]   Vicent Febles D Despina Hidden 03/13/2019, 3:41 PM

## 2019-03-13 NOTE — Progress Notes (Signed)
PROGRESS NOTE    Franklin Anderson  ZOX:[id number] DOB: 29-Sep-1970 DOA: 02/21/2019 PCP: Julieanne MansonMulberry, Elizabeth, MD   Brief Narrative:  48 y.o.malewith medical history significant of rheumatic fever, HTN, ongoingIV heroin abuse, presented to the ED on 10/27 with 3 week course of progressively worsening back pain and BLE weakness associated with some bowel incontinence and trouble walking. Last use of heroin was on the day of admission. He was found to be septic with MSSA bacteremia, endocarditis, osteomyelitis, discitis at L4 and L5, septic pulmonary emboli with loculated effusion and pneumothorax and psoas abscesses. On 10/28 was found to be obtunded and dyspneic and was intubated by critical care, extubated on 10/31. Right psoas abscess was aspirated on 10/28, wound care >MSSA. Respiratory culture from 10/28 also grew MSSA. 11/3: Patient underwent TEE and had acute respiratory distress later that evening with stat chest x-ray showing loculated pneumothorax/CT chest indicating empyema/PE/hydropneumothorax. Now seen by PCCM and post chest tube placement.Code status changed to DNR.  Subjective: Patient continued to experience worsening lower back pain.  Stating that pain interferes with all his activities and sleep.  Denies any radiation to lower limbs or focal deficit.  Assessment & Plan:   Principal Problem:   Sepsis (HCC) Active Problems:   Dyspnea   Heroin use   Endocarditis   Septic pulmonary embolism (HCC)   Osteomyelitis of thoracic spine (HCC)   Osteomyelitis of lumbar spine (HCC)   Acute encephalopathy   AKI (acute kidney injury) (HCC)   Transaminitis   Psoas muscle abscess (HCC)   Pressure injury of skin   Mucus clot in bronchi   Protein-calorie malnutrition, severe   Abnormal echocardiogram   MSSA bacteremia   Acute pulmonary embolism (HCC)   Pleural effusion on left   Empyema, left (HCC)   Chest tube in place   Pain   Pneumothorax   Fistula  MSSA  bacteremia/sepsis with discitis/vertebral osteomyelitis/psoas abscess/septic pulmonary emboli/cavitary lesion, empyema, mural vegetation.Repeat CT chest  shows persistent loculated left hydropneumothorax with decreased in pneumothorax component. Multiple septic emboli throughout both lungs remained unchanged. ID is following-appreciate the recommendations. CT surgery does not think that he is a good candidate for thoracotomy and muscle flap closure,plan is to continue with pigtail empyema tube for a long time. -Continue Ancef for now.By Dr. Zenaida NieceVan dam patient should complete 6 weeks of Ancef in hospital and then discharged home on Keflex for another 30 days. Ancef was started on 02/24/2019.  Acute hypoxic respiratory failure.Due to sepsis and multiple pulmonary emboli. Patient was extubated on 10/31. No current respiratory distress.  Worsening lower back pain.  Can continue to experience worsening lower back pain.  No red flag symptoms.  Left leg raise increases his pain.  No urinary incontinence, saddle anesthesia or focal deficit. 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  Previous MRI was done 1 month ago. -Repeat MRI lumbar spine to see any progression of disease. -Continue pain management.  Psoas abscess S/PCT-guided right psoas aspiration by IR on 10/28 yielding 18 mL of purulent fluid, cultures grew MSSA -Pain is better controlled after starting long-acting OxyContin,  -Increase the dose of Percocet to 2 tablets every 6 4 hourly as needed. -continue as needed Dilaudid.  We should try it p.o. before giving Dilaudid.  Nursing staff made aware of that. -Add lidocaine patch for his back pain.  Constipation.  Most likely secondary to opioid use for pain. -MiraLAX daily. -Continue stool softener.  Pericardial effusion.Noted on previous echo.  Resolved on repeat CT scan   Acute metabolic encephalopathy. Resolved   Hypernatremia. Resolved  AKI. Resolved  Normocytic anemia.Hemoglobin stable.No obvious bleeding. -Continue to monitor, transfuse if drops below 7.  Hypertension.  Normotensive today. -Continue metoprolol, Norvasc and hydralazine IV as needed with parameters.  Pressure injury Sacrum with stage I, wound care per nursing  Severe protein calorie malnutrition, hypoalbuminemia, generalized deconditioning -Albumin level 1.7today. -Working on nutrition.  Objective: Vitals:   03/12/19 2020 03/13/19 0500 03/13/19 0553 03/13/19 1334  BP: 132/87  (!) 128/96 119/85  Pulse: (!) 106  (!) 109 (!) 108  Resp: 16  20 18   Temp: 98.3 F (36.8 C)  98 F (36.7 C) 98 F (36.7 C)  TempSrc: Oral  Oral Oral  SpO2: 96%  97% 97%  Weight:  51.3 kg    Height:        Intake/Output Summary (Last 24 hours) at 03/13/2019 1625 Last data filed at 03/13/2019 1130 Gross per 24 hour  Intake 1646.33 ml  Output 3150 ml  Net -1503.67 ml   Filed Weights   03/11/19 0519 03/12/19 0545 03/13/19 0500  Weight: 53.9 kg 52.1 kg 51.3 kg    Examination:  General exam: Appears calm and comfortable  Respiratory system: Few scattered rhonchi. Respiratory effort normal. Cardiovascular system: S1 & S2 heard, RRR. No JVD, murmurs, rubs, gallops or clicks. No pedal edema. Gastrointestinal system: Abdomen is nondistended, soft and nontender. No organomegaly or masses felt. Normal bowel sounds heard. Musculoskeletal.  Tenderness along lower lumbar and sacral region both spinal and paraspinal.  Left leg raise elicit pain in his back.  No focal neurologic deficit. Central nervous system: Alert and oriented. No focal neurological deficits. Extremities: Symmetric 5 x 5 power. Psychiatry: Judgement and insight appear normal. Mood & affect appropriate.   DVT prophylaxis:Heparin Code Status:DNR  family Communication:No family at bedside. Disposition Plan:Pending improvement and completion of IV antibiotics.   Consultants: Cardiology CCM Infectious disease Palliative care Cardiothoracic surgery.  Procedures:  Antimicrobials: Ancef.  Day 18  Data Reviewed: I have personally reviewed following labs and imaging studies  CBC: Recent Labs  Lab 03/08/19 0610 03/09/19 0916 03/10/19 1029 03/11/19 1124 03/13/19 0936  WBC 8.1 7.2 7.4 8.4 7.7  HGB 10.6* 9.0* 8.3* 8.5* 8.0*  HCT 33.5* 28.3* 26.3* 26.9* 26.1*  MCV 92.0 92.2 91.6 93.4 94.2  PLT 304 349 363 419* 177*   Basic Metabolic Panel: Recent Labs  Lab 03/08/19 0610 03/11/19 1124 03/13/19 0936  NA 134* 134* 133*  K 4.2 4.8 4.1  CL 97* 97* 96*  CO2 28 29 30   GLUCOSE 111* 108* 122*  BUN 11 14 14   CREATININE 0.39* 0.38* 0.38*  CALCIUM 8.2* 8.0* 8.2*   GFR: Estimated Creatinine Clearance: 81.9 mL/min (A) (by C-G formula based on SCr of 0.38 mg/dL (L)). Liver Function Tests: Recent Labs  Lab 03/08/19 0610  AST 73*  ALT 42  ALKPHOS 92  BILITOT 0.4  PROT 6.5  ALBUMIN 1.7*   No results for input(s): LIPASE, AMYLASE in the last 168 hours. No results for input(s): AMMONIA in the last 168 hours. Coagulation Profile: No results for input(s): INR, PROTIME in the last 168 hours. Cardiac Enzymes: No results for input(s): CKTOTAL, CKMB, CKMBINDEX, TROPONINI in the last 168 hours. BNP (last 3 results) No results for input(s): PROBNP in the last 8760 hours. HbA1C: No results for input(s): HGBA1C in the last 72 hours. CBG: Recent Labs  Lab 03/12/19 1202 03/12/19 1745 03/12/19 2110 03/13/19 0747 03/13/19  1153  GLUCAP 233* 145* 109* 96 142*   Lipid Profile: No results for input(s): CHOL, HDL, LDLCALC, TRIG, CHOLHDL, LDLDIRECT in the last 72 hours. Thyroid Function Tests: No results for input(s): TSH, T4TOTAL, FREET4, T3FREE, THYROIDAB in the last 72 hours. Anemia Panel: No results for input(s): VITAMINB12, FOLATE, FERRITIN, TIBC, IRON, RETICCTPCT in the last 72 hours. Sepsis Labs: No results for input(s):  PROCALCITON, LATICACIDVEN in the last 168 hours.  No results found for this or any previous visit (from the past 240 hour(s)).   Radiology Studies: Dg Chest Port 1 View  Result Date: 03/13/2019 CLINICAL DATA:  48 year male with shortness of breath and left-sided pneumothorax. History of septic emboli. EXAM: PORTABLE CHEST 1 VIEW COMPARISON:  Chest radiograph dated 03/10/2019. FINDINGS: Left-sided chest tube in similar position. Overall slight interval increase in the size of the left pneumothorax since the radiograph of 03/10/2019. Continued follow-up recommended. No mediastinal shift. Bilateral nodular and confluent airspace densities similar to prior radiograph. There is a small left pleural effusion. Stable cardiac silhouette. No acute osseous pathology. IMPRESSION: 1. Slight interval increase in the size of the left pneumothorax since the radiograph of 03/10/19. Continued follow-up recommended. 2. Bilateral nodular and confluent airspace densities similar to prior radiograph. Electronically Signed   By: Elgie Collard M.D.   On: 03/13/2019 12:26    Scheduled Meds: . amLODipine  10 mg Oral Daily  . Chlorhexidine Gluconate Cloth  6 each Topical Daily  . feeding supplement (ENSURE ENLIVE)  237 mL Oral BID BM  . feeding supplement (PRO-STAT SUGAR FREE 64)  30 mL Oral TID  . insulin aspart  0-24 Units Subcutaneous TID AC & HS  . lidocaine  1 patch Transdermal Q24H  . mouth rinse  15 mL Mouth Rinse BID  . metoprolol tartrate  50 mg Oral BID  . multivitamin with minerals  1 tablet Oral Daily  . oxyCODONE  15 mg Oral Q12H  . pantoprazole  40 mg Oral Daily  . polyethylene glycol  17 g Oral Daily  . sodium chloride flush  10-40 mL Intracatheter Q12H   Continuous Infusions: . sodium chloride 250 mL (03/07/19 2139)  .  ceFAZolin (ANCEF) IV 2 g (03/13/19 1435)  . sodium chloride       LOS: 19 days   Time spent: 40 minutes.  Arnetha Courser, MD [facility] Pager  [phone]  If 7PM-7AM, please contact night-coverage [email or web address] Password [facility] 03/13/2019, 4:25 PM   This record has been created using Dragon voice recognition software. Errors have been sought and corrected,but may not always be located. Such creation errors do not reflect on the standard of care.

## 2019-03-13 NOTE — TOC Progression Note (Signed)
Transition of Care [facility]) - Progression Note    Patient Details  Name: Franklin Anderson MRN: [id number] Date of Birth: Apr 27, 1971  Transition of Care [facility] CM/SW [place], [place] Phone Number: 03/13/2019, 3:20 PM  Clinical Narrative:   Have now left 2 messages for [facility] admissions dpt. asking for c/b re: patient    Expected Discharge Plan: Home/Self Care Barriers to Discharge: No Barriers Identified  Expected Discharge Plan and Services Expected Discharge Plan: Home/Self Care In-house Referral: Clinical Social Work     Living arrangements for the past 2 months: Single Family Home                                       Social Determinants of Health (SDOH) Interventions    Readmission Risk Interventions No flowsheet data found.

## 2019-03-14 ENCOUNTER — Inpatient Hospital Stay (HOSPITAL_COMMUNITY): Payer: Self-pay

## 2019-03-14 LAB — GLUCOSE, CAPILLARY
Glucose-Capillary: 95 mg/dL (ref 70–99)
Glucose-Capillary: 157 mg/dL — ABNORMAL HIGH (ref 70–99)
Glucose-Capillary: 110 mg/dL — ABNORMAL HIGH (ref 70–99)
Glucose-Capillary: 145 mg/dL — ABNORMAL HIGH (ref 70–99)

## 2019-03-14 IMAGING — DX DG CHEST 1V PORT
1 series · 1 of 1 positions shown · non-contrast
Comparison: [DATE], [DATE], [DATE]

CLINICAL DATA: Chest tube removal

EXAM:
PORTABLE CHEST 1 VIEW

[chest ap]
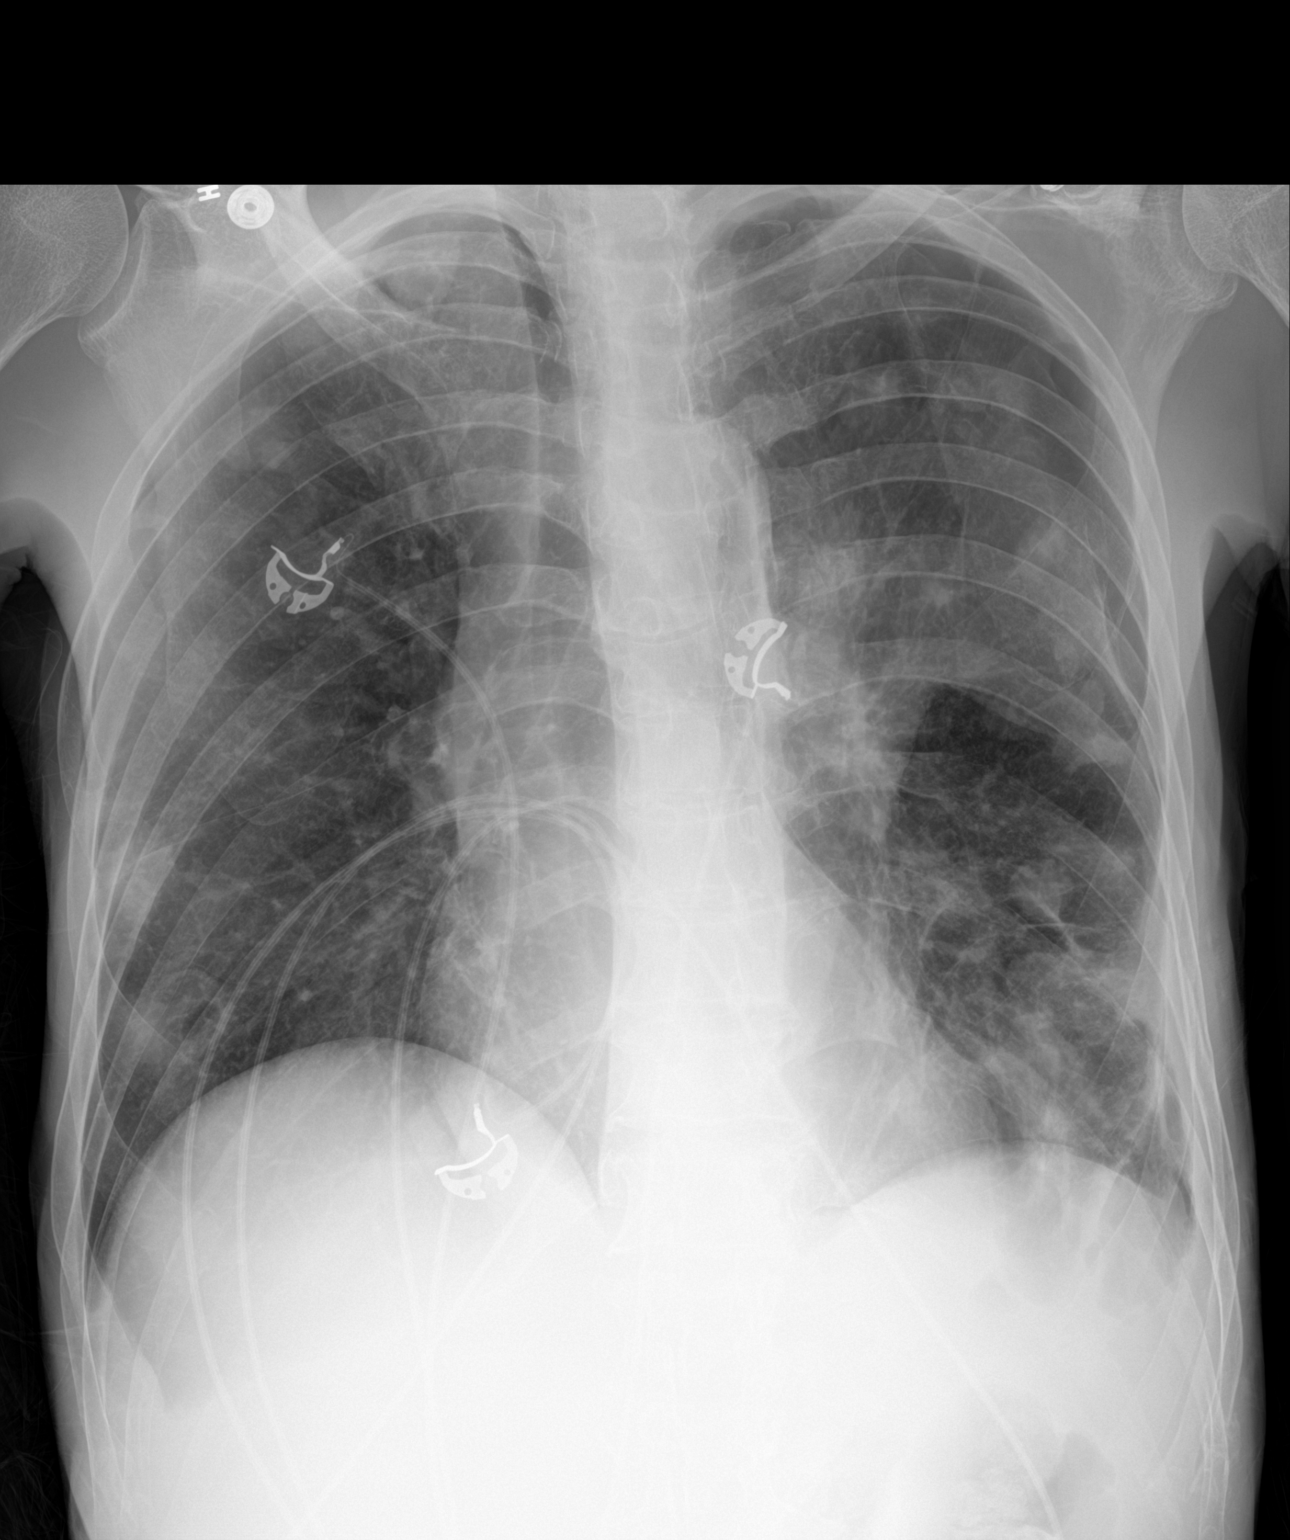

[1 of 1 positions shown; findings below may reference images not displayed]

FINDINGS: Interim removal of left-sided chest tube. Overall no significant
interval change in size of small residual left apicolateral
pneumothorax. There is probable trace pneumothorax at the left CP
angle. Stable cardiomediastinal silhouette. Multiple bilateral
nodules and cavitary lesions as before.
IMPRESSION: 1. Removal of left chest tube.
2. Overall no significant interval change in size of small residual
left apicolateral pneumothorax. Now seen is a small amount of
pneumothorax component at the left CP angle.
3. Stable bilateral lung nodules and cavitary lesions.

## 2019-03-14 IMAGING — DX DG CHEST 1V PORT
1 series · 1 of 1 positions shown · non-contrast
Comparison: [DATE]

CLINICAL DATA: Hydropneumothorax

EXAM:
PORTABLE CHEST 1 VIEW

[chest ap]
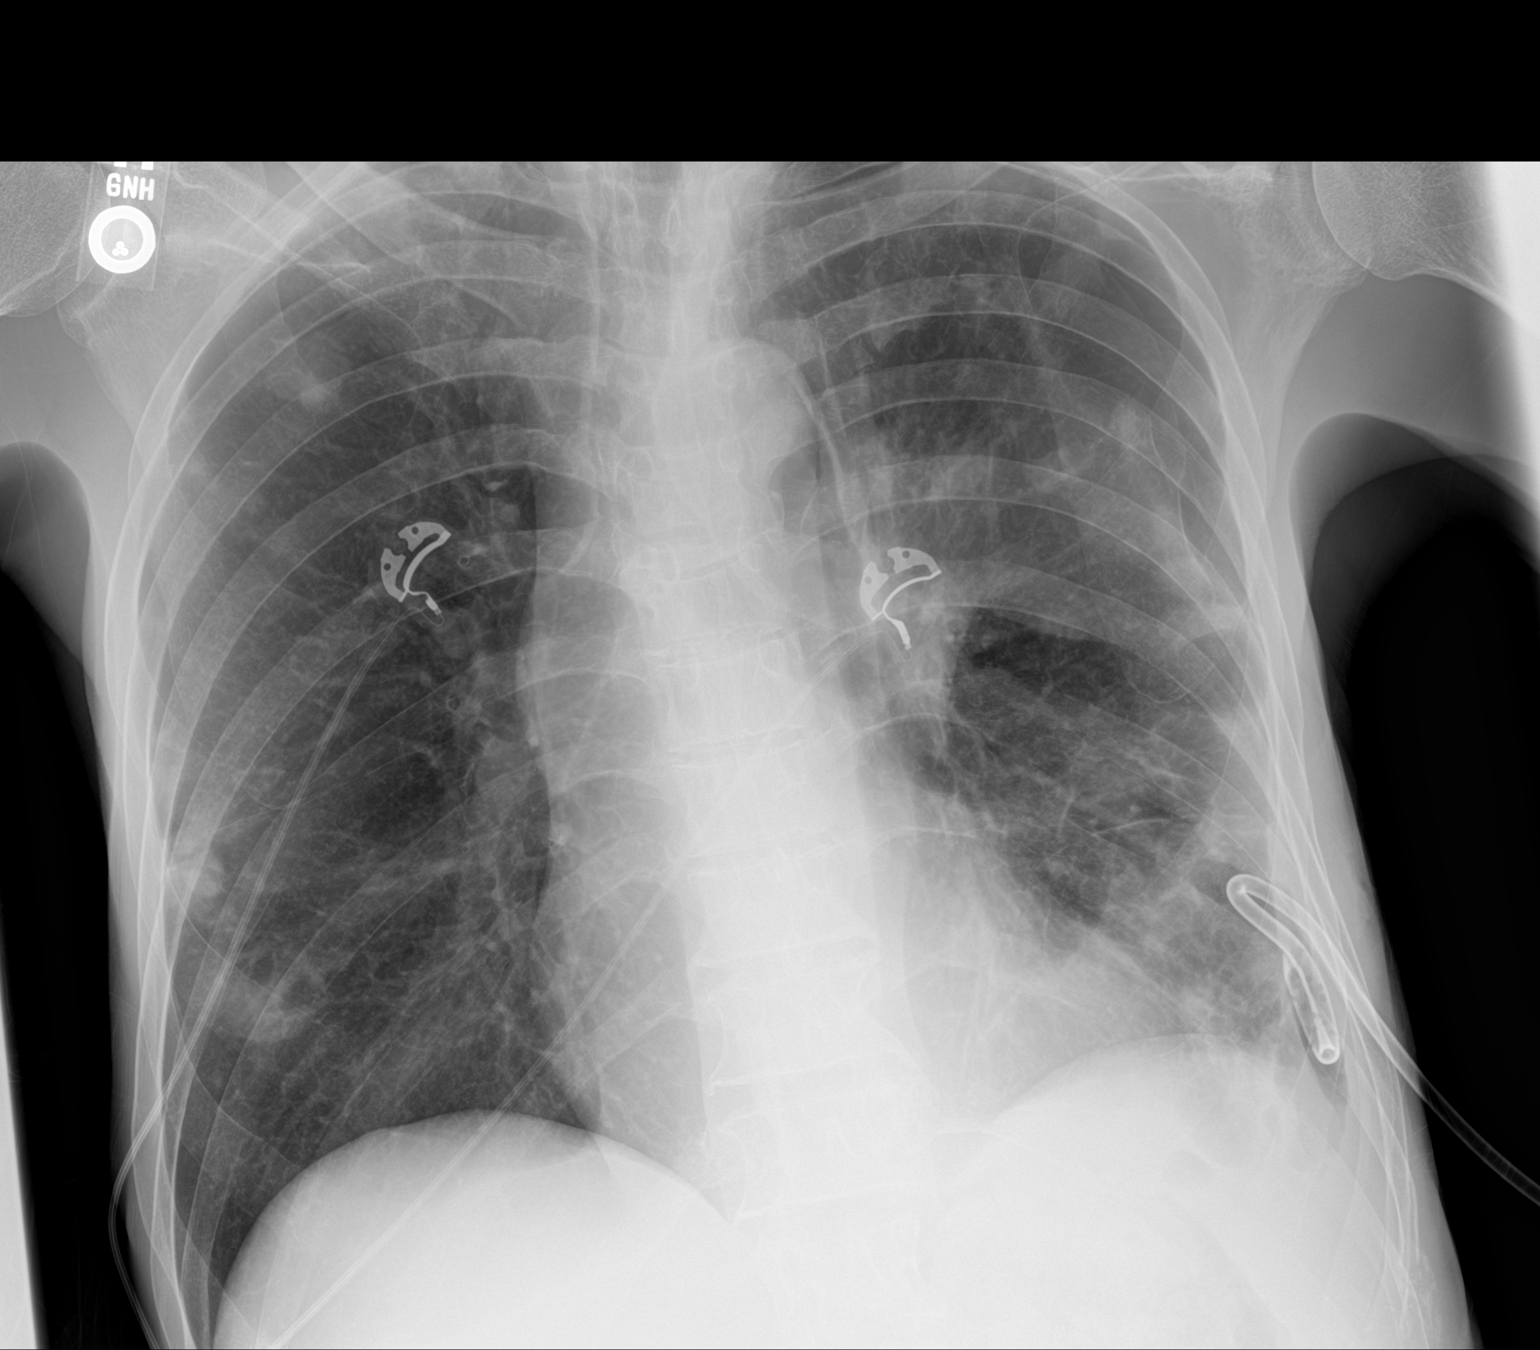

[1 of 1 positions shown; findings below may reference images not displayed]

FINDINGS: No significant change in AP portable examination with a persistent
small left lateral and apical pneumothorax component and a
left-sided pigtail chest tube in position about the lateral lung
base. Probable small persistent effusion component. No change in
multiple masses and nodules bilaterally, some of which are cavitary.
The heart and mediastinum are unremarkable.
IMPRESSION: 1. No significant change in AP portable examination with a
persistent small left lateral and apical pneumothorax component and
a left-sided pigtail chest tube in position about the lateral lung
base. Probable small persistent effusion component.

2. No change in multiple masses and nodules bilaterally, some of
which are cavitary.

## 2019-03-14 IMAGING — MR MR LUMBAR SPINE WO/W CM
4 of 8 series · 16 of 48 positions shown · IV contrast (Yes)
Comparison: Lumbar spine MRI [DATE]

CLINICAL DATA: Back pain, infection suspected. Patient with
disseminated MSSA infection multiple abscesses. Worsening lower back
pain not responding to pain management. Follow-up known
osteomyelitis in spine.

EXAM:
MRI LUMBAR SPINE WITHOUT AND WITH CONTRAST
TECHNIQUE: Multiplanar and multiecho pulse sequences of the lumbar spine were
obtained without and with intravenous contrast.
CONTRAST:  5mL GADAVIST GADOBUTROL 1 MMOL/ML IV SOLN

[Series 4: T1 · sagittal · 5.0mm · 0.51mm/px · 3 of 14 slices shown (1 of 2)]
[im 1/14]
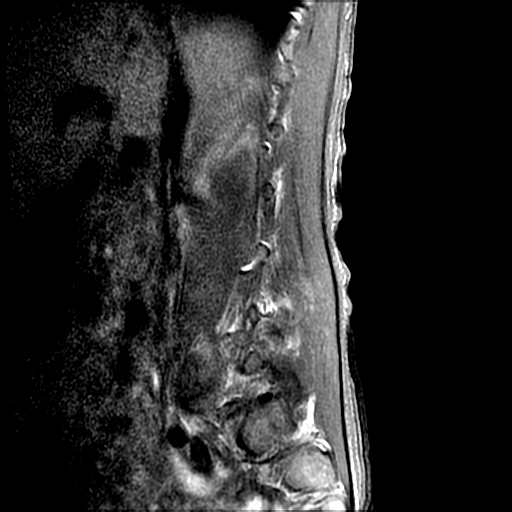
[im 7/14]
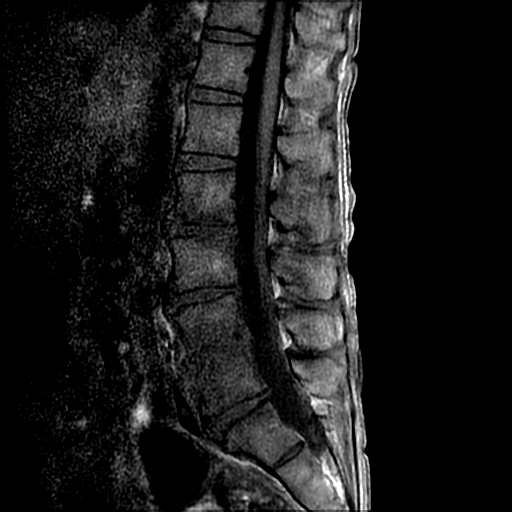
[im 14/14]
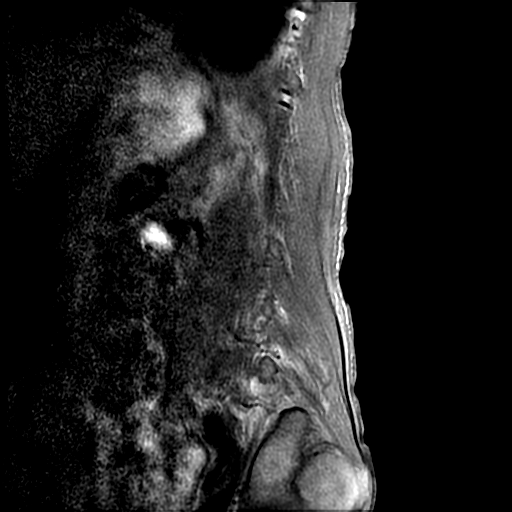

[Series 6: T2 · axial · 5.0mm · 0.39mm/px · z∈[-34,+133]mm · 7 of 40 slices shown]
[im 1/40]
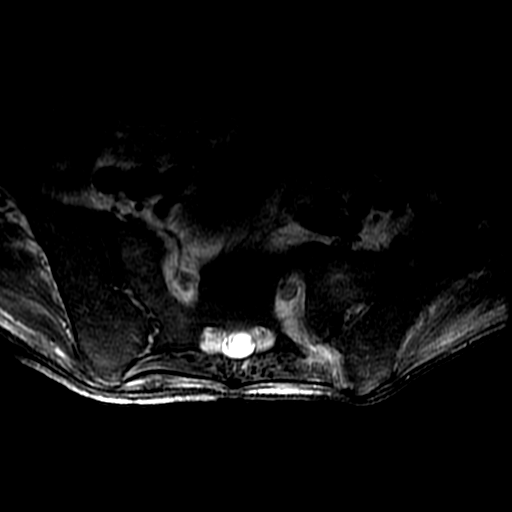
[im 5/40]
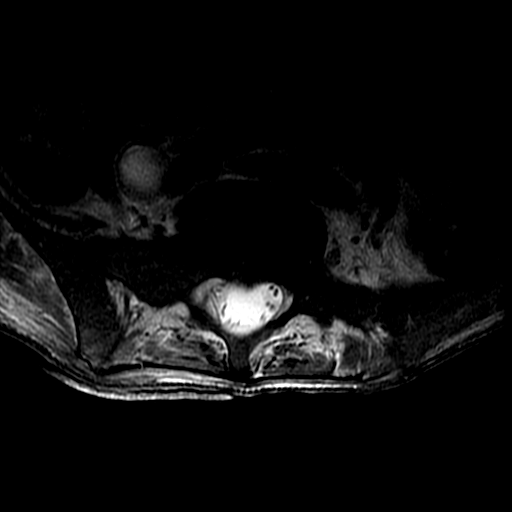
[im 14/40]
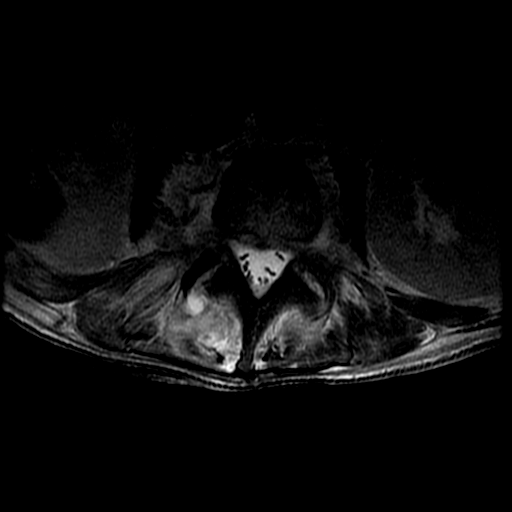
[im 18/40]
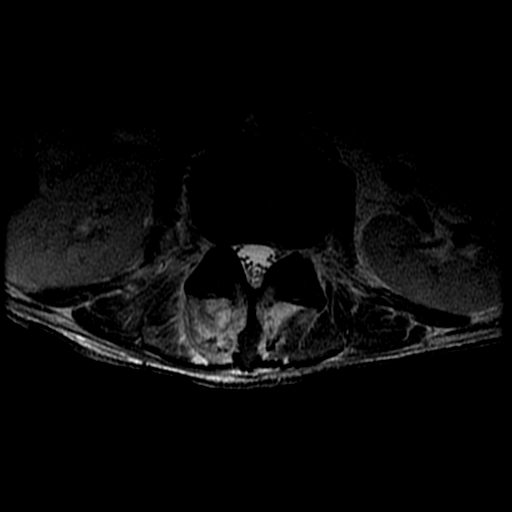
[im 22/40]
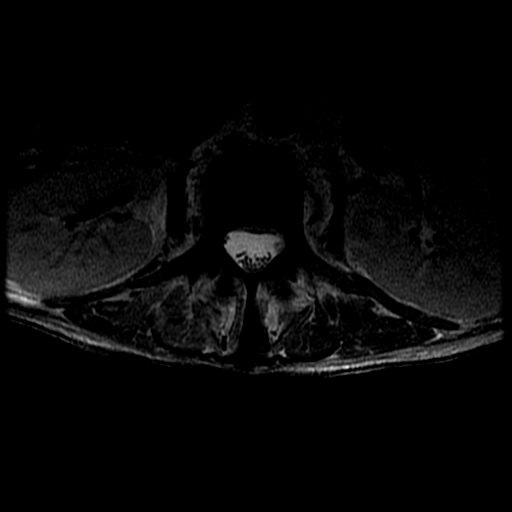
[im 27/40]
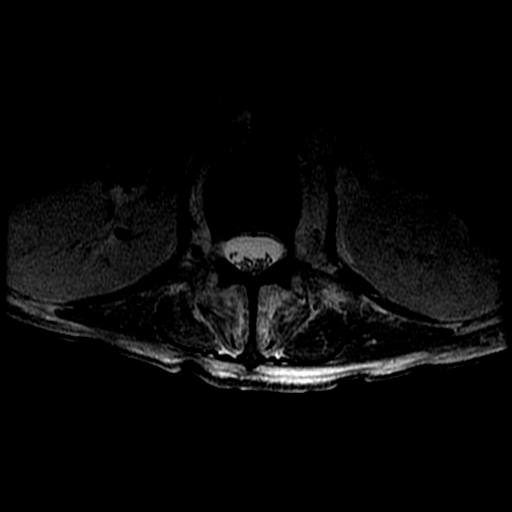
[im 35/40]
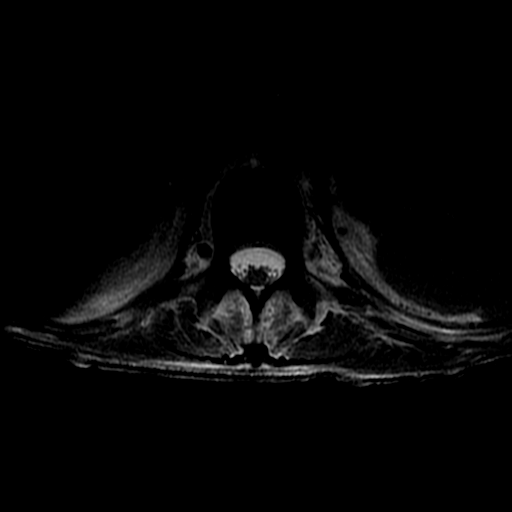

[Series 7: T1 · axial · 5.0mm · 0.39mm/px · z∈[-15,+133]mm · 3 of 40 slices shown (2 of 2)]
[im 5/40]
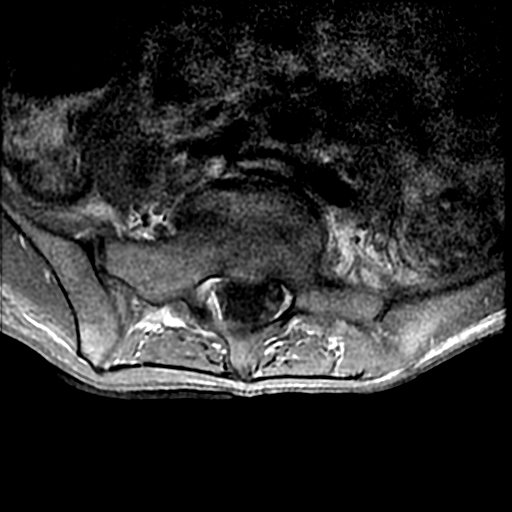
[im 22/40]
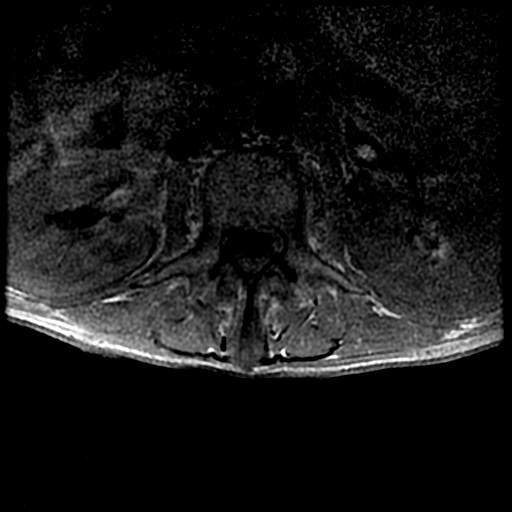
[im 35/40]
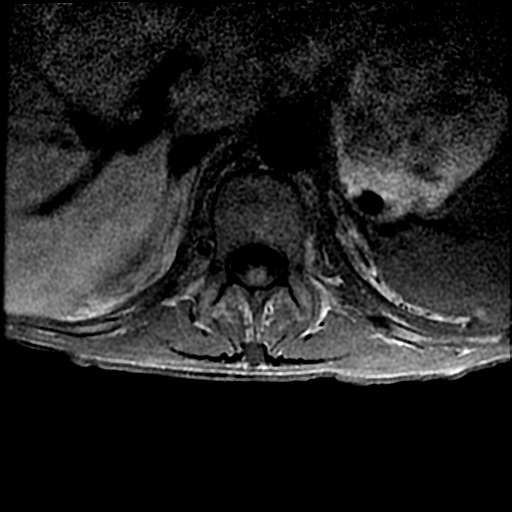

[Series 8: T2 post-contrast · sagittal · 5.0mm · 0.51mm/px · 3 of 14 slices shown]
[im 1/14]
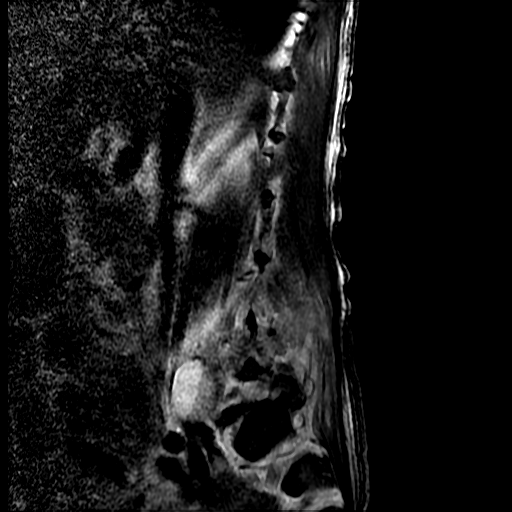
[im 9/14]
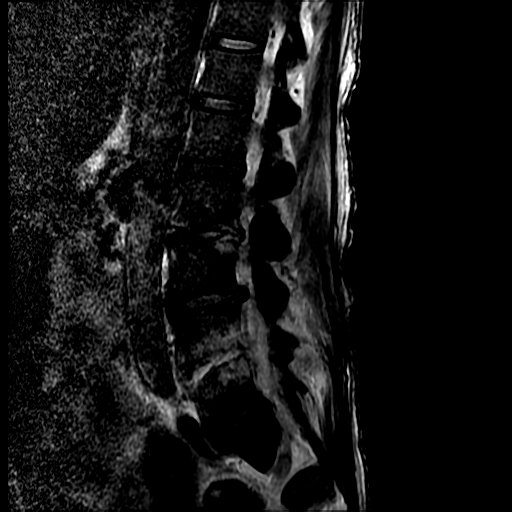
[im 14/14]
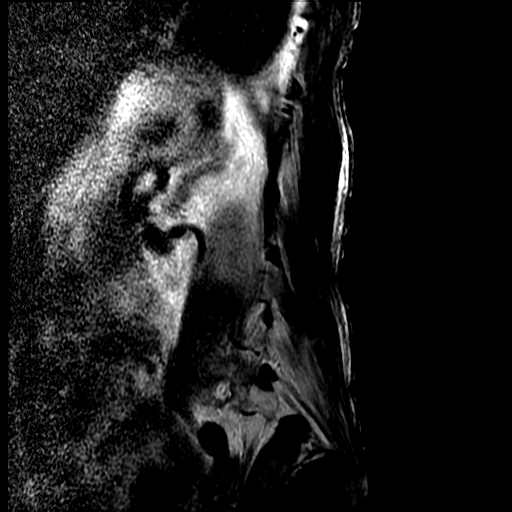

[16 of 48 positions shown; findings below may reference images not displayed]

FINDINGS: Segmentation: For the purposes of this dictation, five lumbar
vertebrae are assumed and the caudal most well-formed intervertebral
disc is designated L5-S1.

Alignment: Straightening of the expected lumbar lordosis. No
significant spondylolisthesis. Lumbar levocurvature.

Vertebrae: Vertebral body height is maintained. Endplate
irregularity at L4-L5 has increased as compared to prior
examination. Again demonstrated there is extensive abnormal T2
hyperintense signal throughout the L4 and L5 vertebrae extending to
the disc space with associated abnormal enhancement. Findings
consistent with discitis/osteomyelitis. As before, there is fluid
within the bilateral L4-L5 facet joints (greater on the right) and
septic arthritis is difficult to exclude (series 11, image 29).

More conspicuous than on prior examination, there is abnormal T2
hyperintensity and enhancement within the L2 and L3 vertebrae along
the disc space. Findings highly suspicious for an additional level
of discitis/osteomyelitis.

Conus medullaris and cauda equina: Conus extends to the L2 level. No
signal abnormality within the visualized distal spinal cord. New
from prior MRI, there is ventral epidural phlegmon at the L4-L5
level measuring 0.6 cm in AP dimension (series 10, image 7).

Paraspinal and other soft tissues: There is nonspecific soft tissue
edema and enhancement within the lumbar paraspinal soft tissues,
greatest at L4-L5 level. More conspicuous than on prior examination.
There is abnormal paraspinal enhancing soft tissue consistent with
phlegmon at the L4-L5 level extending to involve both psoas muscles.
This extends into the bilateral L4-L5 neural foramina and partially
into the bilateral L5-S1 neural foramina. Again demonstrated are
bilateral psoas abscesses, the largest on the right measuring 2.7 x
2.4 x 4.4 cm (series 6, image 33) (series 9, image 13).

Disc levels:

T12-L1: No disc herniation. No significant canal or foraminal
stenosis.

L1-L2: No disc herniation. No significant canal or foraminal
stenosis.

L2-L3: Disc bulge with endplate spurring. New from prior examination
there is a left center/subarticular caudally migrated disc
extrusion. Mild facet arthrosis. The disc extrusion contributes to
significant left subarticular stenosis with encroachment upon the
descending left L3 nerve root. No significant central canal stenosis
or neural foraminal narrowing.

L3-L4: Disc bulge asymmetric to the left with associated endplate
spurring. Mild facet arthrosis. Mild left subarticular and bilateral
neural foraminal narrowing. No significant central canal stenosis.

L4-L5: Disc bulge with endplate remodeling. Ventral epidural
phlegmon as described. No significant spinal canal stenosis. As
before, abnormal enhancement likely reflecting phlegmon extends into
both neural foramina with severe bilateral neural foraminal
narrowing.

L5-S1: Small disc bulge. Mild facet arthrosis. No significant spinal
canal stenosis. Abnormal soft tissue enhancement likely reflecting
phlegmon extends into the bilateral neural foramina.
IMPRESSION: 1. Redemonstrated findings of L4-L5 discitis/osteomyelitis. Endplate
irregularity at this level has progressed. Persistent fluid within
the bilateral L4-L5 facet joints (greater on the right). For which
septic arthritis is difficult to exclude. New ventral epidural
phlegmon at this level measuring 0.6 cm in AP dimension without
significant canal stenosis. Persistent extensive paraspinal phlegmon
at this level extending into the bilateral L4-L5 neural foramina
with resultant severe neural foraminal narrowing. Phlegmon also
extends partially into the bilateral L5-S1 neural foramina.
2. Signal abnormality and abnormal enhancement now present at the
L2-L3 level also suspicious for discitis/osteomyelitis.
3. Redemonstrated bilateral psoas abscesses, the largest on the
right measuring 2.7 x 2.4 x 4.4 cm.
4. New L2-L3 left center/subarticular caudally migrated disc
extrusion. Resultant left subarticular stenosis with encroachment
upon the descending left L3 nerve root.

## 2019-03-14 MED ORDER — LORAZEPAM 2 MG/ML IJ SOLN
2.0000 mg | Freq: Once | INTRAMUSCULAR | Status: AC
  Administered 2019-03-14: 2 mg via INTRAVENOUS
  Filled 2019-03-14: qty 1

## 2019-03-14 MED ORDER — GADOBUTROL 1 MMOL/ML IV SOLN
5.0000 mL | Freq: Once | INTRAVENOUS | Status: AC | PRN
  Administered 2019-03-14: 5 mL via INTRAVENOUS

## 2019-03-14 MED ORDER — HYDROMORPHONE HCL 1 MG/ML IJ SOLN
1.5000 mg | INTRAMUSCULAR | Status: DC | PRN
  Administered 2019-03-14 – 2019-03-15 (×12): 1.5 mg via INTRAVENOUS
  Filled 2019-03-14 (×12): qty 1.5

## 2019-03-14 NOTE — Progress Notes (Signed)
..   NAME:  Franklin Anderson, MRN:  [id number], DOB:  Jun 23, 1970, LOS: 51 ADMISSION DATE:  02/21/2019, CONSULTATION DATE:  02/28/2019 REFERRING MD:  Earnest Conroy MD, CHIEF COMPLAINT:  SOB   Brief History   48 yr old M with PMHx of rheumatic fever, HTN, IV heroin abuse presented on 10/27 w/ back pain and lower ext weakness. 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 Recent TEE showed mobile mass moderate in size located in the mid/ apical  RV that appeared to be attached to a trabeculation. NP overnight evaluated patient at shift change noted to be tachypneic. New CXR 11/3/202  showed left sided loculated pneumothorax. PCCM consulted    Past Medical History  HTN Methadone Use Heroin Use Tobacco Use Rheumatic Fever   Significant Hospital Events   10/27 Admit with worsening back pain and BLE weakness.  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 I 10/28 PCCM evaluation when pt became obtunded and dyspneic. Pt was endotracheally intubated for acute hypoxemic respiratory failure. He received a Left IJ CVC.  He received IV Antibiotics for metastatic MSSAB infection, initially on Vancomycin and Cefepime tapered to Ancef.  He was evaluated with bedside echo and found to have pericardial effusion without tamponade. Formal TTE showed G!DD w EF 60% and possible mitral valve vegetation.  11/03 TEE at [facility] (w/o intubation, tolerated procedure well) and was found to have  mobile mass moderate in size located in the mid/ apical  RV that appears to be attached to a trabeculation.  11/03 NP overnight evaluated patient at shift change noted the pt to be tachypneic. CXR was completed: results were called to [facility] by NP.  It shows a new left sided loculated pneumothorax.  11/04: Spoke w/ Dr Prescott Gum via phone who reviewed imaging and recommended chest tube placement. Chest tube placed, marked improvement in pneumothorax 11/6: Pleural fluid volume much improved.   Ongoing 1 out of 7 airleak.  Suction from pleural tube increased to 30.  Awaiting cultures, currently has gram-positive cocci in clusters 11/09 still w/ airleak. Got symptomatic (more short of breath) when chest tube sxn was not functioning  11/10 on going BPF. Asked for formal thoracic eval. Concerned that we won't be able to get CT out.  11/12: CT of chest: .Persistent loculated LEFT hydropneumothorax, with decrease in pneumothorax component since previous exam.Septic emboli throughout both lungs, little changed  Pain better controlled. Ongoing BPF, with velocity of air leak improved.  11/16 CT placed to water seal   Consults:  10/28 Infectious Disease 10/28 PCCM 10/29 Cardiology 11/03 PCCM re-consult 11/04 Phone consult with thoracic surgery  Procedures:  10/28 CT guided aspiration of right psoas abscesses 10/28 Endotracheally intubated 10/28  LIJ CVC placement 10/28 Bronchoscopy with BAL 10/31 Extubated to 3L [place] by RT 11/3  Transesophageal Echo 11/4 Left-sided chest tube  Micro Data:  BCx2 10/27 >> + MSSA BCx2 10/28 >> + MSSA Abscess Rt Psoas 10/28 >> MSSA Resp Culture 10/28 >> MSSA BCx2 10/30 >> MSSA Pleural fluid 11/4 >> MSSA BCx2 11/5 >> negative   Antimicrobials: per ID   Cefepime10/28 >>10/29 , Vanc 10/28 >>10/30 Cefazolin 10/30 >> planned thru Apr 12 2019 with keflex x 30 d to follow    Subjective   Pt reports ongoing back pain.  Does not want to cough as it "sets him off into fits of pain".  Afebrile.   Objective   Blood pressure 114/77, pulse 91, temperature 97.6 F (36.4 C), resp.  rate 16, height 6' (1.829 m), weight 49.8 kg, SpO2 98 %. on RA.         Intake/Output Summary (Last 24 hours) at 03/14/2019 1043 Last data filed at 03/14/2019 0034 Gross per 24 hour  Intake 120 ml  Output 3375 ml  Net -3255 ml   Filed Weights   03/12/19 0545 03/13/19 0500 03/14/19 0500  Weight: 52.1 kg 51.3 kg 49.8 kg    Examination: General: cachectic adult male  lying in bed in NAD HEENT: MM pink/moist, anicteric, lids/lashes normal, fair dentition, pupils reactive  Neuro: AAOx4, speech clear, MAE, generalized weakness but non-focal exam CV: s1s2 RRR, no m/r/g PULM:  Even/non-labored on RA, soft wheeze bilaterally, left chest tube in place, flushed with 77m NS without difficulty, no air leak noted with deep breath / pt will not cough for exam  GI: soft, bsx4 active  Extremities: warm/dry, no edema, muscle wasting, able to see arterial pulsation in upper arm  Skin: no rashes or lesions, multiple tattoos, [facility]   Uremia and Hypernatremia resolved AKI resolved Acute Respiratory Failure s/p Mechanical Ventilation, Extubated on 10/31   Assessment & Plan:   Left Cavitary PNA with Numerous Rounded and Cavitary fluid-filled lesions  Hydropneumothorax concerning for Empyema BPF Septic VTE Pulmonary Emboli  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 CT Chest 11/11 demonstrated distant loculated left hydropneumothorax with decreasing air component multiple septic emboli throughout both lungs without significant change. Status post ultrasound guided tube thoracostomy on 11/14.   P: Continue antibiotics per ID  No air leak on chest tube, flushes easily > consider removal of tube. Pt wants to wait to hear from Dr. AElsworth SohoFollow intermittent CXR, doubt lung will re-expand.  Reviewed possibility of need of repeat chest tube.  Maximize nutrition support  Defer back pain work up to primary   PCCM will continue to follow.    BNoe Gens NP-C [facility] 03/14/2019, 10:43 AM

## 2019-03-14 NOTE — Progress Notes (Signed)
      INFECTIOUS DISEASE ATTENDING ADDENDUM:   Date: 03/14/2019  Patient name: Franklin Anderson  Medical record number: [id number]  Date of birth: 08/08/1970   Patient with worsening back pain.  Repeat MRI read as :  IMPRESSION:  1. Redemonstrated findings of L4-L5 discitis/osteomyelitis. Endplate irregularity at this level has progressed. Persistent fluid within the bilateral L4-L5 facet joints (greater on the right). For which septic arthritis is difficult to exclude. New ventral epidural phlegmon at this level measuring 0.6 cm in AP dimension without significant canal stenosis. Persistent extensive paraspinal phlegmon at this level extending into the bilateral L4-L5 neural foramina with resultant severe neural foraminal narrowing. Phlegmon also extends partially into the bilateral L5-S1 neural foramina.  2. Signal abnormality and abnormal enhancement now present at the L2-L3 level also suspicious for discitis/osteomyelitis.  3. Redemonstrated bilateral psoas abscesses, the largest on the right measuring 2.7 x 2.4 x 4.4 cm.  4. New L2-L3 left center/subarticular caudally migrated disc extrusion. Resultant left subarticular stenosis with encroachment upon the descending left L3 nerve root.  One could consider consult Neurosurgery though they typically do not operate absent Neurological changes or evidence of impending change.  Certainly he is on the "RIGHT" antibiotics with cefazolin as a beta lactam being bactericidal and superior to non beta lactam therapy  I would see if IR thinks there are any targets they can approach.   If no Neurosurgery or IR intervention would continue current antibiotics with close monitoring of Neuro signs and symptoms.  He is also to have Chest Tube removed.  I hope we can cure this infection medically though I am beginning to have more anxiety that he may need multidisciplinary surgery as well. I appreciate that his health is not optimal.  He has been adherent to therapies offered in the hospital.  Rhina Brackett Dam 03/14/2019, 1:47 PM
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
## 2019-03-14 NOTE — Progress Notes (Signed)
PROGRESS NOTE    Franklin Anderson  HQI:[id number] DOB: 02/10/1971 DOA: 02/21/2019 PCP: Julieanne Manson, MD   Brief Narrative:  48 y.o.malewith medical history significant of rheumatic fever, HTN, ongoingIV heroin abuse, presented to the ED on 10/27 with 3 week course of progressively worsening back pain and BLE weakness associated with some bowel incontinence and trouble walking. Last use of heroin was on the day of admission. He was found to be septic with MSSA bacteremia, endocarditis, osteomyelitis, discitis at L4 and L5, septic pulmonary emboli with loculated effusion and pneumothorax and psoas abscesses. On 10/28 was found to be obtunded and dyspneic and was intubated by critical care, extubated on 10/31. Right psoas abscess was aspirated on 10/28, wound care >MSSA. Respiratory culture from 10/28 also grew MSSA. 11/3: Patient underwent TEE and had acute respiratory distress later that evening with stat chest x-ray showing loculated pneumothorax/CT chest indicating empyema/PE/hydropneumothorax. Now seen by PCCM and post chest tube placement.Code status changed to DNR, later patient made himself full code again.  Subjective: Patient continued to experience worsening lower back pain.  Waiting for repeat MRI today.  Assessment & Plan:   Principal Problem:   Sepsis (HCC) Active Problems:   Dyspnea   Heroin use   Endocarditis   Septic pulmonary embolism (HCC)   Osteomyelitis of thoracic spine (HCC)   Osteomyelitis of lumbar spine (HCC)   Acute encephalopathy   AKI (acute kidney injury) (HCC)   Transaminitis   Psoas muscle abscess (HCC)   Pressure injury of skin   Mucus clot in bronchi   Protein-calorie malnutrition, severe   Abnormal echocardiogram   MSSA bacteremia   Acute pulmonary embolism (HCC)   Pleural effusion on left   Empyema, left (HCC)   Chest tube in place   Pain   Pneumothorax   Fistula  MSSA bacteremia/sepsis with discitis/vertebral  osteomyelitis/psoas abscess/septic pulmonary emboli/cavitary lesion, empyema, mural vegetation.Repeat CT chest  shows persistent loculated left hydropneumothorax with decreased in pneumothorax component. Multiple septic emboli throughout both lungs remained unchanged. ID is following-appreciate the recommendations. CT surgery does not think that he is a good candidate for thoracotomy and muscle flap closure,plan is to continue with pigtail empyema tube for a long time. -Continue Ancef for now.By Dr. Zenaida Niece dam patient should complete 6 weeks of Ancef in hospital and then discharged home on Keflex for another 30 days. Ancef was started on 02/24/2019.  Worsening lower back pain.  Continue to experience worsening lower back pain.  No red flag symptoms.  Left leg raise increases his pain.  No urinary incontinence, saddle anesthesia or focal deficit. 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  Previous MRI was done 1 month ago. -Repeat MRI lumbar spine shows bilateral psoas abscesses with progression of his discitis and osteomyelitis.  There was some nerve compression and worsening of stenosis.  See MRI report. -Talked with ID and IR. -ID to continue current antibiotics, according to Dr. Daiva Eves he might need multidisciplinary surgeries. -IR will drain his abscesses tomorrow.  Order was placed and patient was made n.p.o. after midnight. -Continue pain management. -Continue neuro check, if any neuro deficit will need a neurosurgical consult.  Acute hypoxic respiratory failure.Due to sepsis and multiple pulmonary emboli. Patient was extubated on 10/31. No current respiratory distress.  Psoas abscess. S/PCT-guided right psoas aspiration by IR on 10/28 yielding 18 mL of purulent fluid, cultures grew MSSA -Recurrence of psoas abscesses bilaterally on repeat MRI today. -IR to drain tomorrow. -  Continue pain management.  Constipation.  Most likely  secondary to opioid use for pain. -MiraLAX daily. -Continue stool softener.  Pericardial effusion.Noted on previous echo. Resolved on repeat CT scan   Acute metabolic encephalopathy. Resolved  Hypernatremia. Resolved  AKI. Resolved  Normocytic anemia.Hemoglobin stable.No obvious bleeding. -Continue to monitor, transfuse if drops below 7.  Hypertension.  Normotensive today. -Continue metoprolol, Norvasc and hydralazine IV as needed with parameters.  Pressure injury Sacrum with stage I, wound care per nursing  Severe protein calorie malnutrition, hypoalbuminemia, generalized deconditioning -Albumin level 1.7today. -Working on nutrition.  Objective: Vitals:   03/14/19 0500 03/14/19 0535 03/14/19 0553 03/14/19 1422  BP:  114/77  (!) 132/96  Pulse:  91  (!) 125  Resp:  16  18  Temp:  (!) 97.4 F (36.3 C) 97.6 F (36.4 C) (!) 97.1 F (36.2 C)  TempSrc:    Oral  SpO2:  98%  96%  Weight: 49.8 kg     Height:        Intake/Output Summary (Last 24 hours) at 03/14/2019 1509 Last data filed at 03/14/2019 0711 Gross per 24 hour  Intake --  Output 2675 ml  Net -2675 ml   Filed Weights   03/12/19 0545 03/13/19 0500 03/14/19 0500  Weight: 52.1 kg 51.3 kg 49.8 kg    Examination:  General exam: Appears calm and comfortable  Respiratory system: Few scattered rhonchi. Respiratory effort normal. Cardiovascular system: S1 & S2 heard, RRR. No JVD, murmurs, rubs, gallops or clicks. No pedal edema. Gastrointestinal system: Abdomen is nondistended, soft and nontender. No organomegaly or masses felt. Normal bowel sounds heard. Musculoskeletal.  Tenderness along lower lumbar and sacral region both spinal and paraspinal.  Left leg raise elicit pain in his back.  No focal neurologic deficit. Central nervous system: Alert and oriented. No focal neurological deficits. Extremities: Symmetric 5 x 5 power. Psychiatry: Judgement and insight appear normal. Mood & affect  appropriate.   DVT prophylaxis:Heparin Code Status:DNR  family Communication:No family at bedside. Disposition Plan:Pending improvement and completion of IV antibiotics.  Consultants: Cardiology CCM Infectious disease Palliative care Cardiothoracic surgery.  Procedures:  Antimicrobials: Ancef.  Day 18  Data Reviewed: I have personally reviewed following labs and imaging studies  CBC: Recent Labs  Lab 03/08/19 0610 03/09/19 0916 03/10/19 1029 03/11/19 1124 03/13/19 0936  WBC 8.1 7.2 7.4 8.4 7.7  HGB 10.6* 9.0* 8.3* 8.5* 8.0*  HCT 33.5* 28.3* 26.3* 26.9* 26.1*  MCV 92.0 92.2 91.6 93.4 94.2  PLT 304 349 363 419* 417*   Basic Metabolic Panel: Recent Labs  Lab 03/08/19 0610 03/11/19 1124 03/13/19 0936  NA 134* 134* 133*  K 4.2 4.8 4.1  CL 97* 97* 96*  CO2 28 29 30   GLUCOSE 111* 108* 122*  BUN 11 14 14   CREATININE 0.39* 0.38* 0.38*  CALCIUM 8.2* 8.0* 8.2*   GFR: Estimated Creatinine Clearance: 79.5 mL/min (A) (by C-G formula based on SCr of 0.38 mg/dL (L)). Liver Function Tests: Recent Labs  Lab 03/08/19 0610  AST 73*  ALT 42  ALKPHOS 92  BILITOT 0.4  PROT 6.5  ALBUMIN 1.7*   No results for input(s): LIPASE, AMYLASE in the last 168 hours. No results for input(s): AMMONIA in the last 168 hours. Coagulation Profile: No results for input(s): INR, PROTIME in the last 168 hours. Cardiac Enzymes: No results for input(s): CKTOTAL, CKMB, CKMBINDEX, TROPONINI in the last 168 hours. BNP (last 3 results) No results for input(s): PROBNP in the last 8760 hours. HbA1C:  No results for input(s): HGBA1C in the last 72 hours. CBG: Recent Labs  Lab 03/13/19 1153 03/13/19 1634 03/13/19 2025 03/14/19 0800 03/14/19 1236  GLUCAP 142* 134* 122* 95 157*   Lipid Profile: No results for input(s): CHOL, HDL, LDLCALC, TRIG, CHOLHDL, LDLDIRECT in the last 72 hours. Thyroid Function Tests: No results for input(s): TSH, T4TOTAL, FREET4, T3FREE, THYROIDAB in  the last 72 hours. Anemia Panel: No results for input(s): VITAMINB12, FOLATE, FERRITIN, TIBC, IRON, RETICCTPCT in the last 72 hours. Sepsis Labs: No results for input(s): PROCALCITON, LATICACIDVEN in the last 168 hours.  No results found for this or any previous visit (from the past 240 hour(s)).   Radiology Studies: Mr Lumbar Spine W Wo Contrast  Result Date: 03/14/2019 CLINICAL DATA:  Back pain, infection suspected. Patient with disseminated MSSA infection multiple abscesses. Worsening lower back pain not responding to pain management. Follow-up known osteomyelitis in spine. EXAM: MRI LUMBAR SPINE WITHOUT AND WITH CONTRAST TECHNIQUE: Multiplanar and multiecho pulse sequences of the lumbar spine were obtained without and with intravenous contrast. CONTRAST:  5mL GADAVIST GADOBUTROL 1 MMOL/ML IV SOLN COMPARISON:  Lumbar spine MRI 02/21/2019 FINDINGS: Segmentation: For the purposes of this dictation, five lumbar vertebrae are assumed and the caudal most well-formed intervertebral disc is designated L5-S1. Alignment: Straightening of the expected lumbar lordosis. No significant spondylolisthesis. Lumbar levocurvature. Vertebrae: Vertebral body height is maintained. Endplate irregularity at L4-L5 has increased as compared to prior examination. Again demonstrated there is extensive abnormal T2 hyperintense signal throughout the L4 and L5 vertebrae extending to the disc space with associated abnormal enhancement. Findings consistent with discitis/osteomyelitis. As before, there is fluid within the bilateral L4-L5 facet joints (greater on the right) and septic arthritis is difficult to exclude (series 11, image 29). More conspicuous than on prior examination, there is abnormal T2 hyperintensity and enhancement within the L2 and L3 vertebrae along the disc space. Findings highly suspicious for an additional level of discitis/osteomyelitis. Conus medullaris and cauda equina: Conus extends to the L2 level. No  signal abnormality within the visualized distal spinal cord. New from prior MRI, there is ventral epidural phlegmon at the L4-L5 level measuring 0.6 cm in AP dimension (series 10, image 7). Paraspinal and other soft tissues: There is nonspecific soft tissue edema and enhancement within the lumbar paraspinal soft tissues, greatest at L4-L5 level. More conspicuous than on prior examination. There is abnormal paraspinal enhancing soft tissue consistent with phlegmon at the L4-L5 level extending to involve both psoas muscles. This extends into the bilateral L4-L5 neural foramina and partially into the bilateral L5-S1 neural foramina. Again demonstrated are bilateral psoas abscesses, the largest on the right measuring 2.7 x 2.4 x 4.4 cm (series 6, image 33) (series 9, image 13). Disc levels: T12-L1: No disc herniation. No significant canal or foraminal stenosis. L1-L2: No disc herniation. No significant canal or foraminal stenosis. L2-L3: Disc bulge with endplate spurring. New from prior examination there is a left center/subarticular caudally migrated disc extrusion. Mild facet arthrosis. The disc extrusion contributes to significant left subarticular stenosis with encroachment upon the descending left L3 nerve root. No significant central canal stenosis or neural foraminal narrowing. L3-L4: Disc bulge asymmetric to the left with associated endplate spurring. Mild facet arthrosis. Mild left subarticular and bilateral neural foraminal narrowing. No significant central canal stenosis. L4-L5: Disc bulge with endplate remodeling. Ventral epidural phlegmon as described. No significant spinal canal stenosis. As before, abnormal enhancement likely reflecting phlegmon extends into both neural foramina with severe bilateral neural foraminal  narrowing. L5-S1: Small disc bulge. Mild facet arthrosis. No significant spinal canal stenosis. Abnormal soft tissue enhancement likely reflecting phlegmon extends into the bilateral neural  foramina. IMPRESSION: 1. Redemonstrated findings of L4-L5 discitis/osteomyelitis. Endplate irregularity at this level has progressed. Persistent fluid within the bilateral L4-L5 facet joints (greater on the right). For which septic arthritis is difficult to exclude. New ventral epidural phlegmon at this level measuring 0.6 cm in AP dimension without significant canal stenosis. Persistent extensive paraspinal phlegmon at this level extending into the bilateral L4-L5 neural foramina with resultant severe neural foraminal narrowing. Phlegmon also extends partially into the bilateral L5-S1 neural foramina. 2. Signal abnormality and abnormal enhancement now present at the L2-L3 level also suspicious for discitis/osteomyelitis. 3. Redemonstrated bilateral psoas abscesses, the largest on the right measuring 2.7 x 2.4 x 4.4 cm. 4. New L2-L3 left center/subarticular caudally migrated disc extrusion. Resultant left subarticular stenosis with encroachment upon the descending left L3 nerve root. Electronically Signed   By: Jackey LogeKyle  Golden DO   On: 03/14/2019 13:07   Dg Chest Port 1 View  Result Date: 03/14/2019 CLINICAL DATA:  Hydropneumothorax EXAM: PORTABLE CHEST 1 VIEW COMPARISON:  03/13/2019 FINDINGS: No significant change in AP portable examination with a persistent small left lateral and apical pneumothorax component and a left-sided pigtail chest tube in position about the lateral lung base. Probable small persistent effusion component. No change in multiple masses and nodules bilaterally, some of which are cavitary. The heart and mediastinum are unremarkable. IMPRESSION: 1. No significant change in AP portable examination with a persistent small left lateral and apical pneumothorax component and a left-sided pigtail chest tube in position about the lateral lung base. Probable small persistent effusion component. 2. No change in multiple masses and nodules bilaterally, some of which are cavitary. Electronically Signed    By: Lauralyn PrimesAlex  Bibbey M.D.   On: 03/14/2019 09:58   Dg Chest Port 1 View  Result Date: 03/13/2019 CLINICAL DATA:  77Forty-eight year male with shortness of breath and left-sided pneumothorax. History of septic emboli. EXAM: PORTABLE CHEST 1 VIEW COMPARISON:  Chest radiograph dated 03/10/2019. FINDINGS: Left-sided chest tube in similar position. Overall slight interval increase in the size of the left pneumothorax since the radiograph of 03/10/2019. Continued follow-up recommended. No mediastinal shift. Bilateral nodular and confluent airspace densities similar to prior radiograph. There is a small left pleural effusion. Stable cardiac silhouette. No acute osseous pathology. IMPRESSION: 1. Slight interval increase in the size of the left pneumothorax since the radiograph of 03/10/19. Continued follow-up recommended. 2. Bilateral nodular and confluent airspace densities similar to prior radiograph. Electronically Signed   By: Elgie CollardArash  Radparvar M.D.   On: 03/13/2019 12:26    Scheduled Meds:  amLODipine  10 mg Oral Daily   Chlorhexidine Gluconate Cloth  6 each Topical Daily   feeding supplement (ENSURE ENLIVE)  237 mL Oral BID BM   feeding supplement (PRO-STAT SUGAR FREE 64)  30 mL Oral TID   insulin aspart  0-24 Units Subcutaneous TID AC & HS   lidocaine  1 patch Transdermal Q24H   mouth rinse  15 mL Mouth Rinse BID   metoprolol tartrate  50 mg Oral BID   multivitamin with minerals  1 tablet Oral Daily   oxyCODONE  15 mg Oral Q12H   pantoprazole  40 mg Oral Daily   polyethylene glycol  17 g Oral Daily   sodium chloride flush  10-40 mL Intracatheter Q12H   Continuous Infusions:  sodium chloride 250 mL (03/07/19 2139)  ceFAZolin (ANCEF) IV 2 g (03/14/19 1325)   sodium chloride       LOS: 20 days   Time spent: 45 minutes.  Arnetha Courser, MD [facility] Pager [phone]  If 7PM-7AM, please contact night-coverage [email or web address] Password [facility] 03/14/2019, 3:09 PM    This record has been created using Conservation officer, historic buildings. Errors have been sought and corrected,but may not always be located. Such creation errors do not reflect on the standard of care.

## 2019-03-15 ENCOUNTER — Inpatient Hospital Stay (HOSPITAL_COMMUNITY): Payer: Self-pay

## 2019-03-15 DIAGNOSIS — B1920 Unspecified viral hepatitis C without hepatic coma: Secondary | ICD-10-CM

## 2019-03-15 DIAGNOSIS — G061 Intraspinal abscess and granuloma: Secondary | ICD-10-CM

## 2019-03-15 DIAGNOSIS — G062 Extradural and subdural abscess, unspecified: Secondary | ICD-10-CM

## 2019-03-15 LAB — GLUCOSE, CAPILLARY
Glucose-Capillary: 101 mg/dL — ABNORMAL HIGH (ref 70–99)
Glucose-Capillary: 105 mg/dL — ABNORMAL HIGH (ref 70–99)
Glucose-Capillary: 204 mg/dL — ABNORMAL HIGH (ref 70–99)

## 2019-03-15 LAB — COMPREHENSIVE METABOLIC PANEL
ALT: 21 U/L (ref 0–44)
AST: 25 U/L (ref 15–41)
Albumin: 2 g/dL — ABNORMAL LOW (ref 3.5–5.0)
Alkaline Phosphatase: 99 U/L (ref 38–126)
Anion gap: 9 (ref 5–15)
BUN: 12 mg/dL (ref 6–20)
CO2: 27 mmol/L (ref 22–32)
Calcium: 8.4 mg/dL — ABNORMAL LOW (ref 8.9–10.3)
Chloride: 97 mmol/L — ABNORMAL LOW (ref 98–111)
Creatinine, Ser: 0.36 mg/dL — ABNORMAL LOW (ref 0.61–1.24)
GFR calc Af Amer: >60 mL/min (ref >60)
GFR calc non Af Amer: >60 mL/min (ref >60)
Glucose, Bld: 99 mg/dL (ref 70–99)
Potassium: 4.3 mmol/L (ref 3.5–5.1)
Sodium: 133 mmol/L — ABNORMAL LOW (ref 135–145)
Total Bilirubin: 0.2 mg/dL — ABNORMAL LOW (ref 0.3–1.2)
Total Protein: 6.8 g/dL (ref 6.5–8.1)

## 2019-03-15 LAB — CBC WITH DIFFERENTIAL/PLATELET
Abs Immature Granulocytes: 0.03 10*3/uL (ref 0.00–0.07)
Basophils Absolute: 0.1 10*3/uL (ref 0.0–0.1)
Basophils Relative: 1 %
Eosinophils Absolute: 0.1 10*3/uL (ref 0.0–0.5)
Eosinophils Relative: 2 %
HCT: 28.8 % — ABNORMAL LOW (ref 39.0–52.0)
Hemoglobin: 8.8 g/dL — ABNORMAL LOW (ref 13.0–17.0)
Immature Granulocytes: 0 %
Lymphocytes Relative: 31 %
Lymphs Abs: 2.1 10*3/uL (ref 0.7–4.0)
MCH: 28.7 pg (ref 26.0–34.0)
MCHC: 30.6 g/dL (ref 30.0–36.0)
MCV: 93.8 fL (ref 80.0–100.0)
Monocytes Absolute: 0.7 10*3/uL (ref 0.1–1.0)
Monocytes Relative: 10 %
Neutro Abs: 3.8 10*3/uL (ref 1.7–7.7)
Neutrophils Relative %: 56 %
Platelets: 548 10*3/uL — ABNORMAL HIGH (ref 150–400)
RBC: 3.07 MIL/uL — ABNORMAL LOW (ref 4.22–5.81)
RDW: 17.7 % — ABNORMAL HIGH (ref 11.5–15.5)
WBC: 6.8 10*3/uL (ref 4.0–10.5)
nRBC: 0 % (ref 0.0–0.2)

## 2019-03-15 LAB — PROTIME-INR
INR: 1.2 (ref 0.8–1.2)
Prothrombin Time: 14.8 seconds (ref 11.4–15.2)

## 2019-03-15 IMAGING — DX DG CHEST 1V PORT
1 series · 1 of 1 positions shown · non-contrast
Comparison: [DATE].

CLINICAL DATA: Hydropneumothorax.

EXAM:
PORTABLE CHEST 1 VIEW

[chest ap]
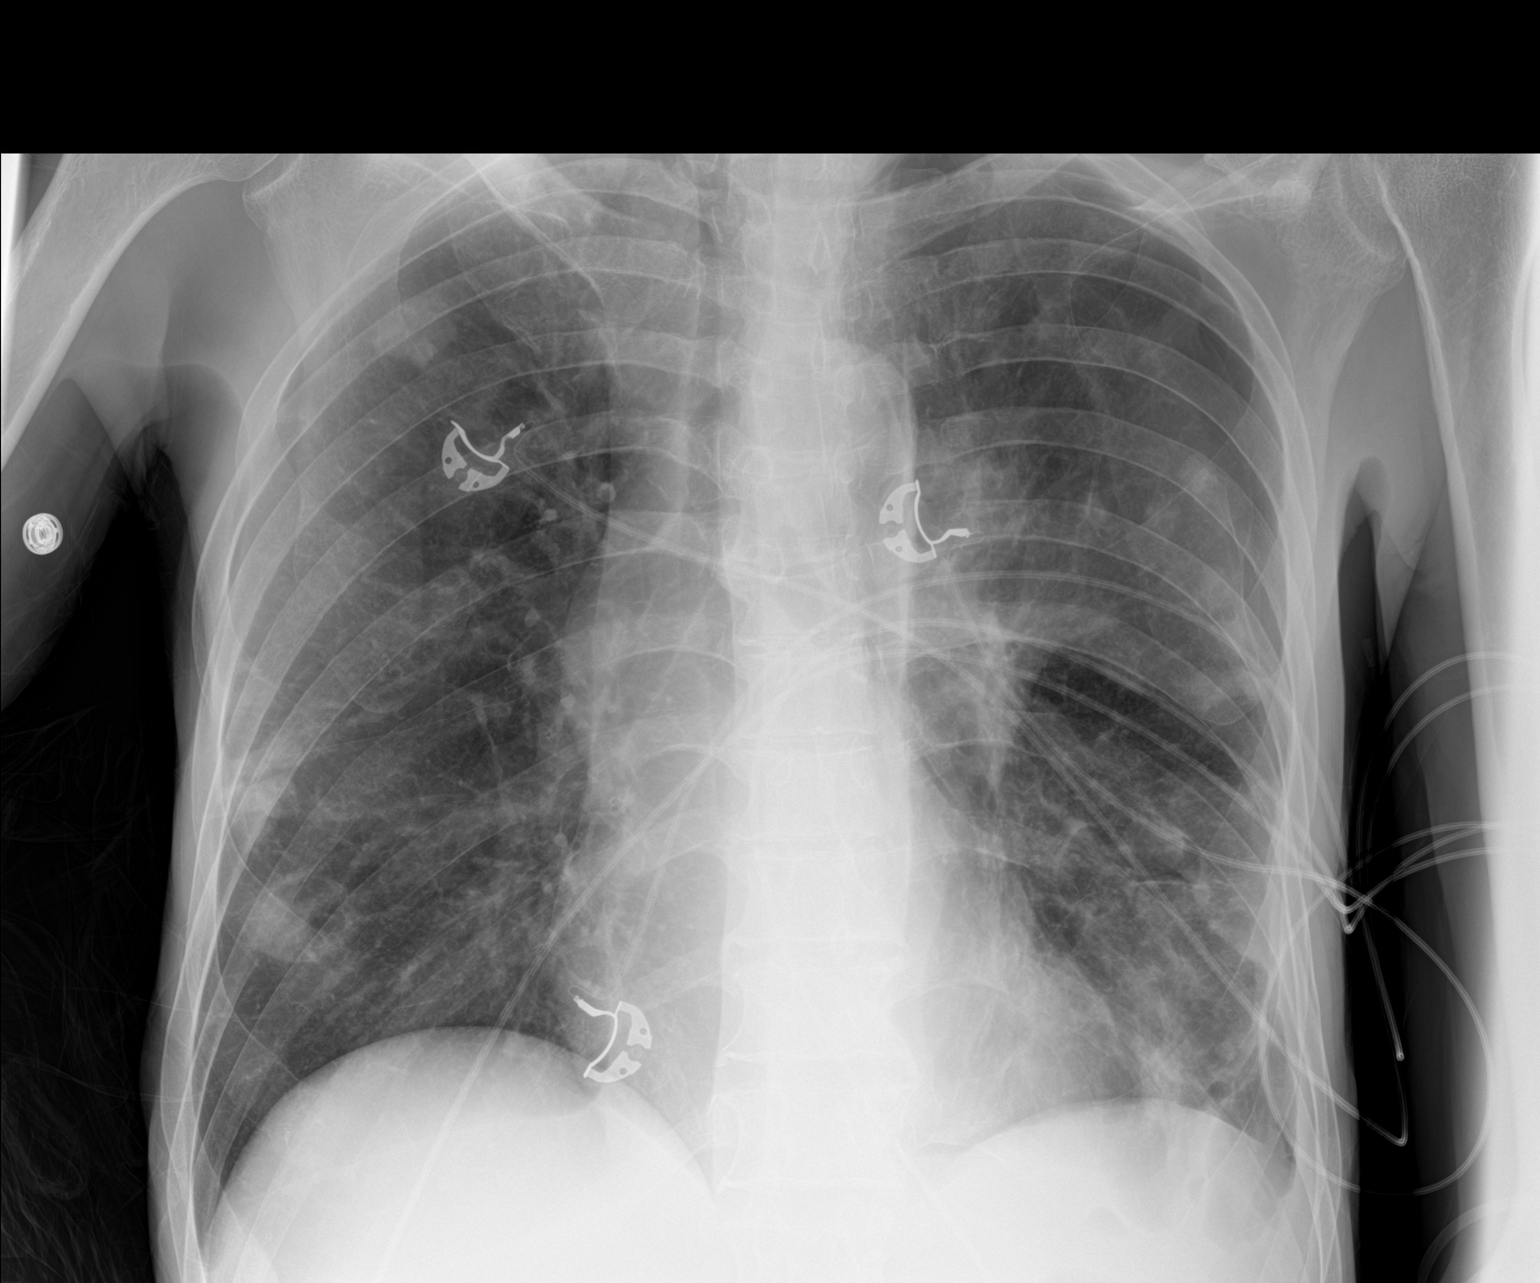

[1 of 1 positions shown; findings below may reference images not displayed]

FINDINGS: The heart size and mediastinal contours are within normal limits.
Stable mild left apical pneumothorax is noted. Stable bilateral
nodular densities are noted some of which are cavitary, consistent
with septic emboli. The visualized skeletal structures are
unremarkable.
IMPRESSION: Stable mild left apical pneumothorax is noted. Stable bilateral
nodular densities are noted, some of which are cavitary, consistent
with septic emboli.

## 2019-03-15 IMAGING — CT CT IMAGE GUIDED DRAINAGE BY PERCUTANEOUS CATHETER
1 of 4 series · 12 of 32 positions shown, 17 images · non-contrast
Comparison: Lumbar spine MRI-[DATE];

INDICATION: History of intravenous drug abuse with recurrent right-sided
paraspinal abscess. Patient presents today for CT-guided aspiration
and/or drainage catheter placement. No, patient previously underwent
CT-guided paraspinal aspiration on [DATE].

EXAM:
CT IMAGE GUIDED DRAINAGE BY PERCUTANEOUS CATHETER

[Series 2: i-spiral 5.0 b31f · axial · 0.98mm/px · z∈[-541,-376]mm · 12 of 55 slices shown, 17 images]
[im 4/55  soft-tissue]
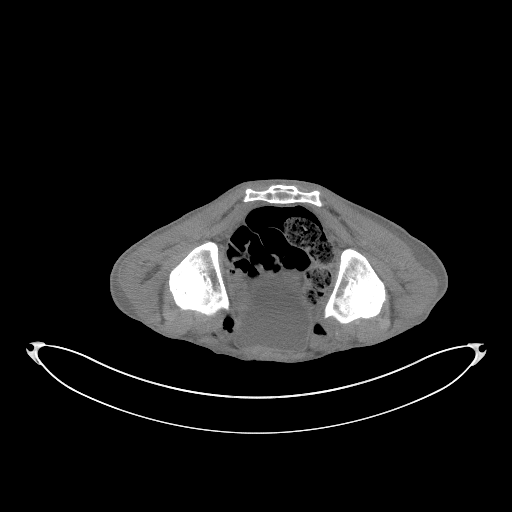
[im 4/55  bone]
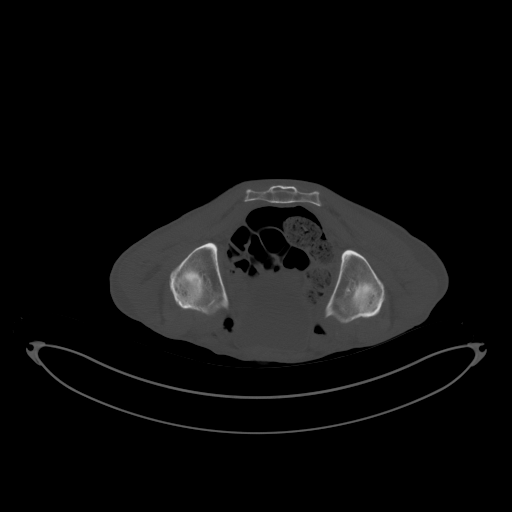
[im 11/55  soft-tissue]
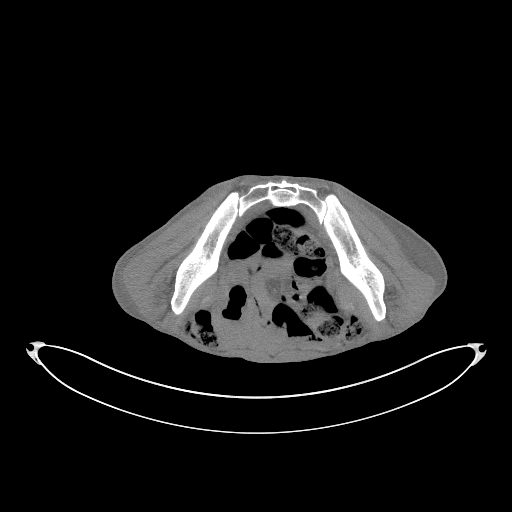
[im 14/55  soft-tissue]
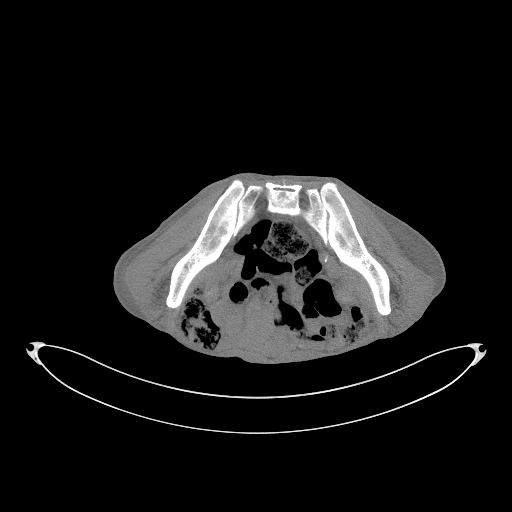
[im 17/55  soft-tissue]
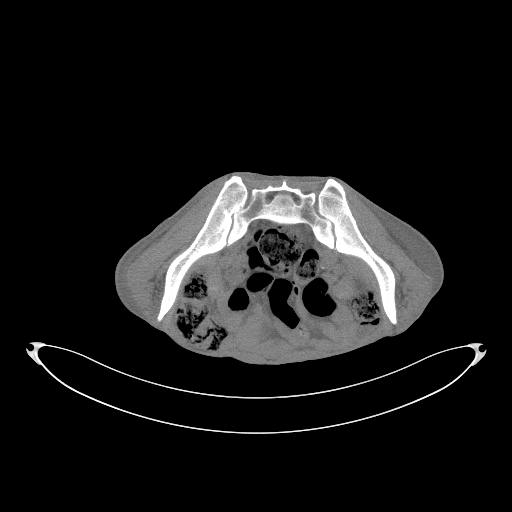
[im 24/55  soft-tissue]
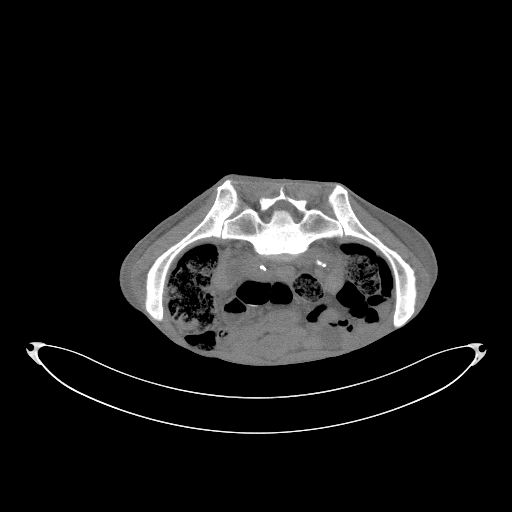
[im 28/55  soft-tissue]
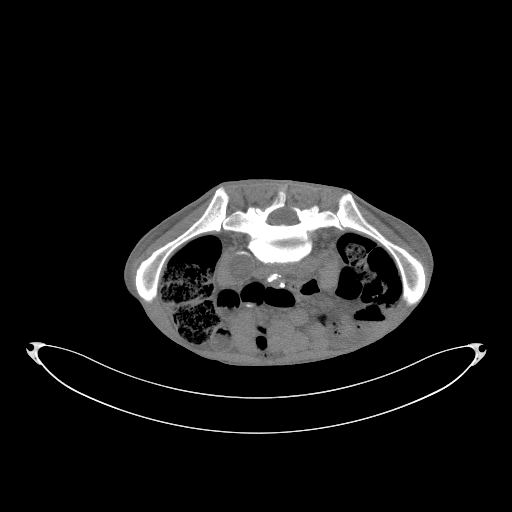
[im 31/55  soft-tissue]
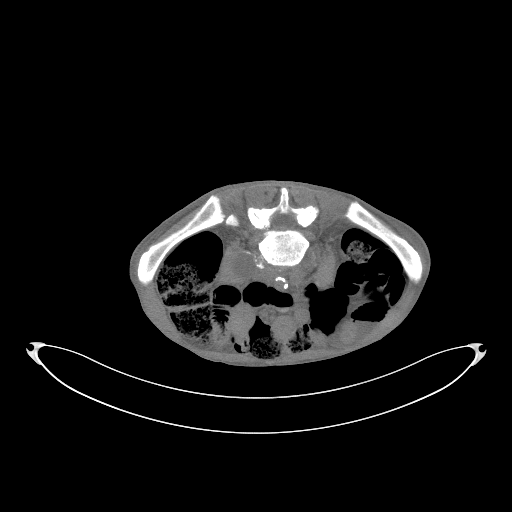
[im 38/55  soft-tissue]
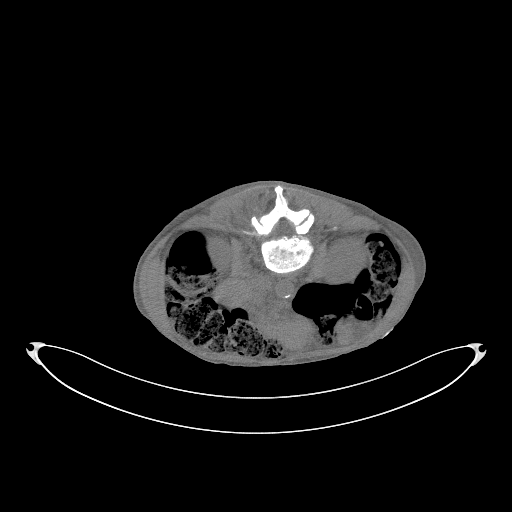
[im 41/55  soft-tissue]
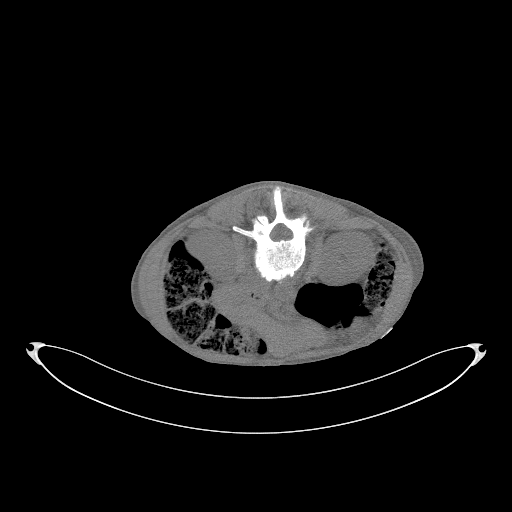
[im 41/55  lung]
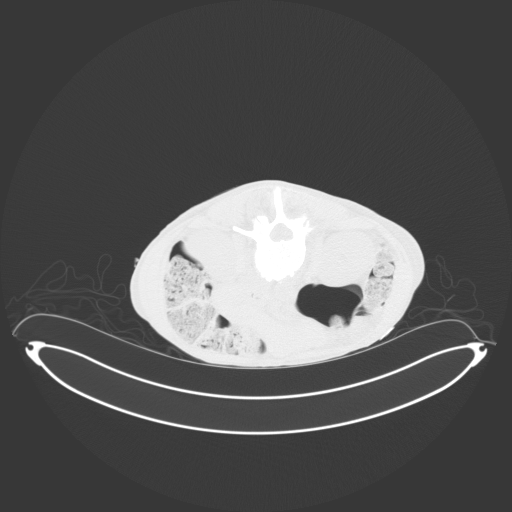
[im 41/55  bone]
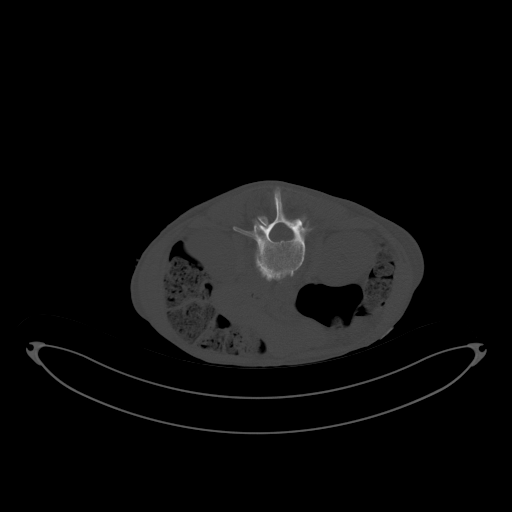
[im 44/55  soft-tissue]
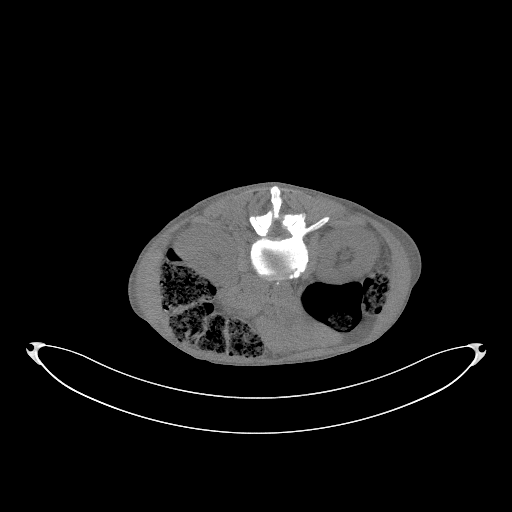
[im 44/55  lung]
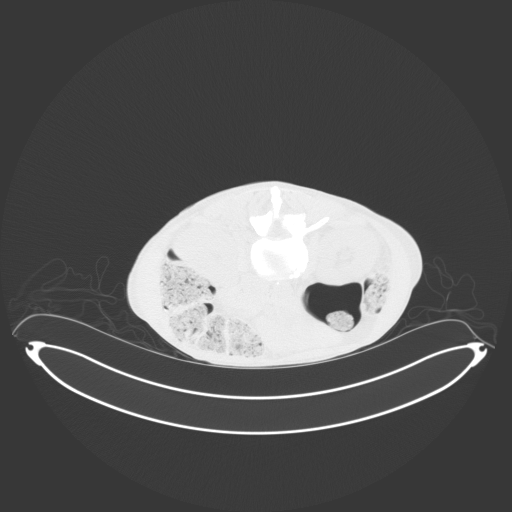
[im 48/55  lung]
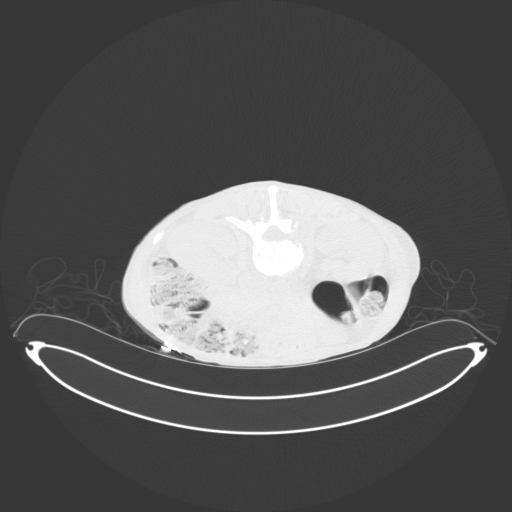
[im 51/55  soft-tissue]
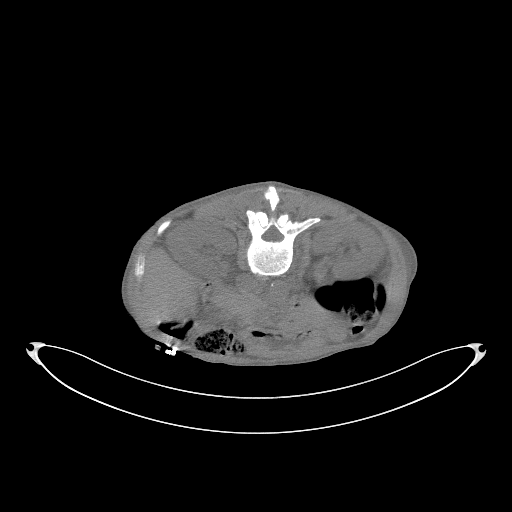
[im 51/55  lung]
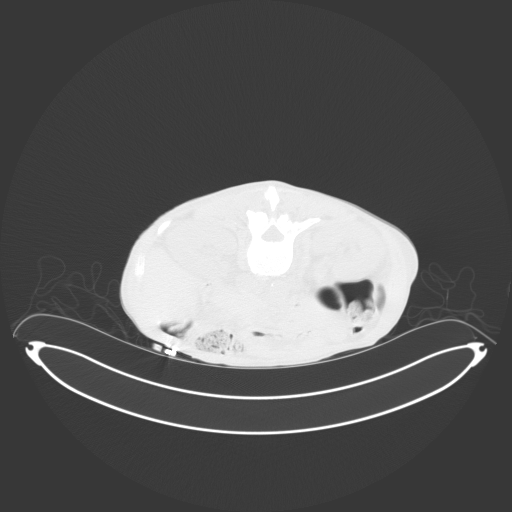

[12 of 32 positions shown; findings below may reference images not displayed]

CT-guided paraspinal
abscess aspiration-[DATE]

MEDICATIONS:
The patient is currently admitted to the hospital and receiving
intravenous antibiotics. The antibiotics were administered within an
appropriate time frame prior to the initiation of the procedure.

ANESTHESIA/SEDATION:
Moderate (conscious) sedation was employed during this procedure. A
total of Benadryl 25 mg IV; versed 3 mg and Fentanyl 100 mcg was
administered intravenously.

Moderate Sedation Time: 14 minutes. The patient's level of
consciousness and vital signs were monitored continuously by
radiology nursing throughout the procedure under my direct
supervision.

CONTRAST:  None

COMPLICATIONS:
None immediate.

PROCEDURE:
Informed written consent was obtained from the patient after a
discussion of the risks, benefits and alternatives to treatment. The
patient was placed prone on the CT gantry and a pre procedural CT
was performed re-demonstrating the known abscess/fluid collection
within the inferior aspect the right iliopsoas musculature with
dominant component measuring approximately 2.6 x 2.4 cm (25, series
2) the procedure was planned. A timeout was performed prior to the
initiation of the procedure.

The skin overlying the posterior inferior back was prepped and
draped in the usual sterile fashion. The overlying soft tissues were
anesthetized with 1% lidocaine with epinephrine. Appropriate
trajectory was planned with the use of a 22 gauge spinal needle. An
18 gauge trocar needle was advanced into the abscess/fluid
collection and a short Amplatz super stiff wire was coiled within
the collection. Appropriate positioning was confirmed with a limited
CT scan. The tract was serially dilated allowing placement of a 10
French all-purpose drainage catheter. Appropriate positioning was
confirmed with a limited postprocedural CT scan.

Approximately 15 ml of purulent fluid was aspirated. The tube was
connected to a JP bulb and sutured in place. A dressing was placed.
The patient tolerated the procedure well without immediate post
procedural complication.
IMPRESSION: Successful CT guided placement of a 10 French all purpose drain
catheter into the recurrent right paraspinal abscess with aspiration
of 15 mL of purulent fluid. Samples were sent to the laboratory as
requested by the ordering clinical team.

PLAN:
- Flush the percutaneous drainage catheter with 10 cc of normal
saline b.i.d.

- Record daily output from the percutaneous drainage catheter.

- once drainage catheter output is less than 10 cc per day
(excluding flush), repeat contrast-enhanced CT scan of the abdomen
and pelvis is recommended to ensure abscess resolution. Ultimately,
the percutaneous drainage catheter may be removed once the abscess
has resolved on CT imaging, not requiring dedicated percutaneous
drainage catheter injection given lack clinical concern for an
enteric fistula.

## 2019-03-15 MED ORDER — FENTANYL CITRATE (PF) 100 MCG/2ML IJ SOLN
INTRAMUSCULAR | Status: AC | PRN
  Administered 2019-03-15 (×2): 50 ug via INTRAVENOUS

## 2019-03-15 MED ORDER — MIDAZOLAM HCL 2 MG/2ML IJ SOLN
INTRAMUSCULAR | Status: AC | PRN
  Administered 2019-03-15 (×3): 1 mg via INTRAVENOUS

## 2019-03-15 MED ORDER — DIPHENHYDRAMINE HCL 50 MG/ML IJ SOLN
INTRAMUSCULAR | Status: AC | PRN
  Administered 2019-03-15: 25 mg via INTRAVENOUS

## 2019-03-15 MED ORDER — DIPHENHYDRAMINE HCL 50 MG/ML IJ SOLN
INTRAMUSCULAR | Status: AC
  Filled 2019-03-15: qty 1

## 2019-03-15 MED ORDER — HYDROMORPHONE HCL 1 MG/ML IJ SOLN
1.5000 mg | INTRAMUSCULAR | Status: DC | PRN
  Administered 2019-03-15 – 2019-03-19 (×41): 1.5 mg via INTRAVENOUS
  Filled 2019-03-15 (×42): qty 1.5

## 2019-03-15 MED ORDER — FENTANYL CITRATE (PF) 100 MCG/2ML IJ SOLN
INTRAMUSCULAR | Status: AC
  Filled 2019-03-15: qty 4

## 2019-03-15 MED ORDER — MIDAZOLAM HCL 2 MG/2ML IJ SOLN
INTRAMUSCULAR | Status: AC
  Filled 2019-03-15: qty 4

## 2019-03-15 MED ORDER — SODIUM CHLORIDE 0.9% FLUSH
5.0000 mL | Freq: Three times a day (TID) | INTRAVENOUS | Status: DC
  Administered 2019-03-15 – 2019-03-26 (×26): 5 mL

## 2019-03-15 MED ORDER — AMLODIPINE BESYLATE 5 MG PO TABS
5.0000 mg | ORAL_TABLET | Freq: Every day | ORAL | Status: DC
  Administered 2019-03-16 – 2019-04-05 (×21): 5 mg via ORAL
  Filled 2019-03-15 (×21): qty 1

## 2019-03-15 NOTE — Progress Notes (Signed)
BP = 129/97. Patient has history of HTN. Complained pain at his back. Pain medication was given as MD ordered.

## 2019-03-15 NOTE — Progress Notes (Signed)
Patient called often for pain medication and RN gave him pain medication as MD ordered. Patient still called for pain and requested RN to give him PO pain med with IV dilaudid. RN explained to patient about safety in medication administration and it is not a good way to give both pain medication at the same time. Patient was very irritated and talk to RN inappropriate way.

## 2019-03-15 NOTE — Progress Notes (Signed)
Nutrition Follow-up  DOCUMENTATION CODES:   Severe malnutrition in context of social or environmental circumstances  INTERVENTION:   -Ensure Enlive po BID, each supplement provides 350 kcal and 20 grams of protein -Prostat liquid protein PO 30 ml TID with meals, each supplement provides 100 kcal, 15 grams protein. -Magic cup TID with meals, each supplement provides 290 kcal and 9 grams of protein -Multivitamin with minerals daily  NUTRITION DIAGNOSIS:   Severe Malnutrition related to social / environmental circumstances(IVDU) as evidenced by severe fat depletion, severe muscle depletion.  Ongoing.  GOAL:   Patient will meet greater than or equal to 90% of their needs  Progressing.  MONITOR:   PO intake, Supplement acceptance, Weight trends, Skin, I & O's, Labs  ASSESSMENT:   48 y.o. male with medical history of IVDU, ongoing heroin use. Patient presented to the ED on 10/27 with 3 week history of progressively worsening back pain and BLE weakness.  Denies any urinary retention but has noticed some bowel incontinence. Stated that he has had increasing trouble walking and that he has lost weight. Last use of heroin was today (10/27).  10/28 - intubated 10/31 - extubated 11/03 - TEE 11/04 - s/p chest tube insertion for empyema and pneumothorax 11/17- Chest tube d/c  Patient currently NPO for image guided aspiration/possible drainage of  right psoas abscess via IR. On 11/17, pt consumed 75-100% of meals. Pt is drinking 1 Ensure and 1 Prostat supplement daily.   Admission weight: 140 lbs. Current weight: 110 lbs.  I/Os: -22.7L since 11/4 UOP: 4L x 24 hrs  Medications: Multivitamin with minerals daily  Labs reviewed: CBGs: 101-145 Low Na  Diet Order:   Diet Order            Diet NPO time specified Except for: Sips with Meds  Diet effective midnight              EDUCATION NEEDS:   No education needs have been identified at this time  Skin:  Skin Assessment:  Skin Integrity Issues: Skin Integrity Issues:: Stage I, DTI DTI: rt hip Stage I: sacrum  Last BM:  11/17 -type 2  Height:   Ht Readings from Last 1 Encounters:  02/28/19 6' (1.829 m)    Weight:   Wt Readings from Last 1 Encounters:  03/15/19 49.9 kg    Ideal Body Weight:  80.9 kg  BMI:  Body mass index is 14.92 kg/m.  Estimated Nutritional Needs:   Kcal:  2100-2300  Protein:  100-120 grams  Fluid:  >/= 2.0 L  Franklin Bibles, MS, RD, LDN Inpatient Clinical Dietitian Pager: [phone] After Hours Pager: [phone]

## 2019-03-15 NOTE — Progress Notes (Signed)
Subjective:  Back pain  Antibiotics:  Anti-infectives (From admission, onward)   Start     Dose/Rate Route Frequency Ordered Stop   02/24/19 2200  vancomycin (VANCOCIN) IVPB 1000 mg/200 mL premix  Status:  Discontinued     1,000 mg 200 mL/hr over 60 Minutes Intravenous Every 12 hours 02/24/19 1010 02/24/19 1253   02/24/19 1600  ceFAZolin (ANCEF) IVPB 2g/100 mL premix     2 g 200 mL/hr over 30 Minutes Intravenous Every 8 hours 02/24/19 1253     02/23/19 2200  vancomycin (VANCOCIN) IVPB 750 mg/150 ml premix  Status:  Discontinued     750 mg 150 mL/hr over 60 Minutes Intravenous Every 24 hours 02/23/19 0853 02/23/19 0909   02/23/19 2200  vancomycin (VANCOCIN) IVPB 750 mg/150 ml premix  Status:  Discontinued     750 mg 150 mL/hr over 60 Minutes Intravenous Every 12 hours 02/23/19 0909 02/24/19 1010   02/22/19 2200  vancomycin (VANCOCIN) IVPB 750 mg/150 ml premix  Status:  Discontinued     750 mg 150 mL/hr over 60 Minutes Intravenous Every 24 hours 02/22/19 0826 02/22/19 0827   02/22/19 2200  vancomycin (VANCOCIN) IVPB 1000 mg/200 mL premix  Status:  Discontinued     1,000 mg 200 mL/hr over 60 Minutes Intravenous Every 24 hours 02/22/19 0827 02/23/19 0853   02/22/19 0900  ceFEPIme (MAXIPIME) 2 g in sodium chloride 0.9 % 100 mL IVPB  Status:  Discontinued     2 g 200 mL/hr over 30 Minutes Intravenous Every 12 hours 02/22/19 0826 02/23/19 1135   02/21/19 2015  ceFEPIme (MAXIPIME) 2 g in sodium chloride 0.9 % 100 mL IVPB     2 g 200 mL/hr over 30 Minutes Intravenous  Once 02/21/19 2013 02/22/19 0031   02/21/19 2015  metroNIDAZOLE (FLAGYL) IVPB 500 mg     500 mg 100 mL/hr over 60 Minutes Intravenous  Once 02/21/19 2013 02/22/19 0031   02/21/19 2015  vancomycin (VANCOCIN) IVPB 1000 mg/200 mL premix     1,000 mg 200 mL/hr over 60 Minutes Intravenous  Once 02/21/19 2013 02/22/19 0200      Medications: Scheduled Meds:  [START ON 03/16/2019] amLODipine  5 mg Oral Daily    Chlorhexidine Gluconate Cloth  6 each Topical Daily   diphenhydrAMINE       feeding supplement (ENSURE ENLIVE)  237 mL Oral BID BM   feeding supplement (PRO-STAT SUGAR FREE 64)  30 mL Oral TID   fentaNYL       insulin aspart  0-24 Units Subcutaneous TID AC & HS   lidocaine  1 patch Transdermal Q24H   mouth rinse  15 mL Mouth Rinse BID   metoprolol tartrate  50 mg Oral BID   midazolam       multivitamin with minerals  1 tablet Oral Daily   oxyCODONE  15 mg Oral Q12H   pantoprazole  40 mg Oral Daily   polyethylene glycol  17 g Oral Daily   sodium chloride flush  10-40 mL Intracatheter Q12H   Continuous Infusions:  sodium chloride 250 mL (03/07/19 2139)    ceFAZolin (ANCEF) IV 2 g (03/15/19 1514)   sodium chloride     PRN Meds:.sodium chloride, acetaminophen **OR** acetaminophen, acetaminophen, clonazePAM, hydrALAZINE, HYDROmorphone (DILAUDID) injection, ipratropium-albuterol, ondansetron **OR** ondansetron (ZOFRAN) IV, oxyCODONE-acetaminophen, senna-docusate, sodium chloride flush    Objective: Weight change: 0.1 kg  Intake/Output Summary (Last 24 hours) at 03/15/2019 1637 Last data filed at 03/15/2019  1600 Gross per 24 hour  Intake 1263.6 ml  Output 3015 ml  Net -1751.4 ml   Blood pressure 120/86, pulse 99, temperature 97.7 F (36.5 C), temperature source Oral, resp. rate 12, height 6' (1.829 m), weight 49.9 kg, SpO2 100 %. Temp:  [97.7 F (36.5 C)-98.1 F (36.7 C)] 97.7 F (36.5 C) (11/18 1343) Pulse Rate:  [85-110] 99 (11/18 1625) Resp:  [12-26] 12 (11/18 1625) BP: (110-139)/(86-97) 120/86 (11/18 1625) SpO2:  [96 %-100 %] 100 % (11/18 1625) Weight:  [49.9 kg] 49.9 kg (11/18 0522)  Physical Exam: General: Alert and awake, oriented x3,cachectic, facial wasting HEENT: anicteric sclera, EOMI CVStach rate, normal  No mgr Chest: , coarse breath sounds, CT in place with bloody , purulent material Abdomen: soft non-distended,  Extremities: 3+ edema in  LE Skin: no rashes, + tatooos Neuro: nonfocal  CBC:    BMET Recent Labs    03/13/19 0936 03/15/19 0545  NA 133* 133*  K 4.1 4.3  CL 96* 97*  CO2 30 27  GLUCOSE 122* 99  BUN 14 12  CREATININE 0.38* 0.36*  CALCIUM 8.2* 8.4*     Liver Panel  Recent Labs    03/15/19 0545  PROT 6.8  ALBUMIN 2.0*  AST 25  ALT 21  ALKPHOS 99  BILITOT 0.2*       Sedimentation Rate No results for input(s): ESRSEDRATE in the last 72 hours. C-Reactive Protein No results for input(s): CRP in the last 72 hours.  Micro Results: Recent Results (from the past 720 hour(s))  Blood Culture (routine x 2)     Status: Abnormal   Collection Time: 02/21/19  8:13 PM   Specimen: BLOOD LEFT FOREARM  Result Value Ref Range Status   Specimen Description   Final    BLOOD LEFT FOREARM Performed at [facility], [address] [place], [place] [address]    Special Requests   Final    BOTTLES DRAWN AEROBIC AND ANAEROBIC Blood Culture results may not be optimal due to an inadequate volume of blood received in culture bottles Performed at [facility] [address] [place], [place] [address]    Culture  Setup Time   Final    GRAM POSITIVE COCCI IN BOTH AEROBIC AND ANAEROBIC BOTTLES CRITICAL RESULT CALLED TO, READ BACK BY AND VERIFIED WITH: PHARMD J LEGGE [id number] AT 1337 BY CM CRITICAL RESULT CALLED TO, READ BACK BY AND VERIFIED WITH: PHARMD E JACKSON [id number] AT 1357 BY CM Performed at [facility], [place] [address], [place], [place] [address]    Culture STAPHYLOCOCCUS AUREUS (A)  Final   Report Status 02/24/2019 FINAL  Final   Organism ID, Bacteria STAPHYLOCOCCUS AUREUS  Final      Susceptibility   Staphylococcus aureus - MIC*    CIPROFLOXACIN <=0.5 SENSITIVE Sensitive     ERYTHROMYCIN >=8 RESISTANT Resistant     GENTAMICIN <=0.5 SENSITIVE Sensitive     OXACILLIN 0.5 SENSITIVE Sensitive     TETRACYCLINE <=1 SENSITIVE Sensitive     VANCOMYCIN 1 SENSITIVE  Sensitive     TRIMETH/SULFA <=10 SENSITIVE Sensitive     CLINDAMYCIN <=0.25 SENSITIVE Sensitive     RIFAMPIN <=0.5 SENSITIVE Sensitive     Inducible Clindamycin NEGATIVE Sensitive     * STAPHYLOCOCCUS AUREUS  SARS Coronavirus 2 by RT PCR (hospital order, performed in [facility] hospital lab) Nasopharyngeal Nasopharyngeal Swab     Status: None   Collection Time: 02/21/19  8:16 PM  Specimen: Nasopharyngeal Swab  Result Value Ref Range Status   SARS Coronavirus 2 NEGATIVE NEGATIVE Final    Comment: (NOTE) If result is NEGATIVE SARS-CoV-2 target nucleic acids are NOT DETECTED. The SARS-CoV-2 RNA is generally detectable in upper and lower  respiratory specimens during the acute phase of infection. The lowest  concentration of SARS-CoV-2 viral copies this assay can detect is 250  copies / mL. A negative result does not preclude SARS-CoV-2 infection  and should not be used as the sole basis for treatment or other  patient management decisions.  A negative result may occur with  improper specimen collection / handling, submission of specimen other  than nasopharyngeal swab, presence of viral mutation(s) within the  areas targeted by this assay, and inadequate number of viral copies  (<250 copies / mL). A negative result must be combined with clinical  observations, patient history, and epidemiological information. If result is POSITIVE SARS-CoV-2 target nucleic acids are DETECTED. The SARS-CoV-2 RNA is generally detectable in upper and lower  respiratory specimens dur ing the acute phase of infection.  Positive  results are indicative of active infection with SARS-CoV-2.  Clinical  correlation with patient history and other diagnostic information is  necessary to determine patient infection status.  Positive results do  not rule out bacterial infection or co-infection with other viruses. If result is PRESUMPTIVE POSTIVE SARS-CoV-2 nucleic acids MAY BE PRESENT.   A presumptive positive  result was obtained on the submitted specimen  and confirmed on repeat testing.  While 2019 novel coronavirus  (SARS-CoV-2) nucleic acids may be present in the submitted sample  additional confirmatory testing may be necessary for epidemiological  and / or clinical management purposes  to differentiate between  SARS-CoV-2 and other Sarbecovirus currently known to infect humans.  If clinically indicated additional testing with an alternate test  methodology [phone]) is advised. The SARS-CoV-2 RNA is generally  detectable in upper and lower respiratory sp ecimens during the acute  phase of infection. The expected result is Negative. Fact Sheet for Patients:  StrictlyIdeas.no Fact Sheet for Healthcare Providers: BankingDealers.co.za This test is not yet approved or cleared by the [place] FDA and has been authorized for detection and/or diagnosis of SARS-CoV-2 by FDA under an Emergency Use Authorization (EUA).  This EUA will remain in effect (meaning this test can be used) for the duration of the COVID-19 declaration under Section 564(b)(1) of the Act, 21 U.S.C. section 360bbb-3(b)(1), unless the authorization is terminated or revoked sooner. Performed at [facility], [place] [address], [place], [place] [address]   MRSA PCR Screening     Status: None   Collection Time: 02/22/19  4:56 AM   Specimen: Nasal Mucosa; Nasopharyngeal  Result Value Ref Range Status   MRSA by PCR NEGATIVE NEGATIVE Final    Comment:        The GeneXpert MRSA Assay (FDA approved for NASAL specimens only), is one component of a comprehensive MRSA colonization surveillance program. It is not intended to diagnose MRSA infection nor to guide or monitor treatment for MRSA infections. Performed at [facility], [place] [address], [place], [place] [address]   Blood Culture (routine x 2)     Status: Abnormal   Collection Time:  02/22/19  5:33 AM   Specimen: BLOOD  Result Value Ref Range Status   Specimen Description   Final    BLOOD LEFT ANTECUBITAL Performed at [facility] [address], [place], [place] [address]  Special Requests   Final    BOTTLES DRAWN AEROBIC AND ANAEROBIC Blood Culture adequate volume Performed at [facility] [address], [place], [place] [address]    Culture  Setup Time   Final    GRAM POSITIVE COCCI ANAEROBIC BOTTLE ONLY CRITICAL RESULT CALLED TO, READ BACK BY AND VERIFIED WITH: PHARMD E JACKSON [id number] AT 1108 BY CM    Culture (A)  Final    STAPHYLOCOCCUS AUREUS SUSCEPTIBILITIES PERFORMED ON PREVIOUS CULTURE WITHIN THE LAST 5 DAYS. Performed at [facility], [place] [address], [place], [place] [address]    Report Status 02/24/2019 FINAL  Final  Aerobic/Anaerobic Culture (surgical/deep wound)     Status: None   Collection Time: 02/22/19 10:05 AM   Specimen: Abscess  Result Value Ref Range Status   Specimen Description   Final    ABSCESS RT PSOAS Performed at [facility] [address] [place], [place] [address]    Special Requests   Final    NONE Performed at [facility], [place] [address] [place], [place] [address]    Gram Stain   Final    RARE WBC PRESENT,BOTH PMN AND MONONUCLEAR FEW GRAM POSITIVE COCCI IN PAIRS    Culture   Final    MODERATE STAPHYLOCOCCUS AUREUS NO ANAEROBES ISOLATED Performed at [facility], [place] [address], [place], [place] [address]    Report Status 02/27/2019 FINAL  Final   Organism ID, Bacteria STAPHYLOCOCCUS AUREUS  Final      Susceptibility   Staphylococcus aureus - MIC*    CIPROFLOXACIN <=0.5 SENSITIVE Sensitive     ERYTHROMYCIN >=8 RESISTANT Resistant     GENTAMICIN <=0.5 SENSITIVE Sensitive     OXACILLIN 0.5 SENSITIVE Sensitive     TETRACYCLINE <=1 SENSITIVE Sensitive     VANCOMYCIN <=0.5 SENSITIVE Sensitive     TRIMETH/SULFA <=10  SENSITIVE Sensitive     CLINDAMYCIN <=0.25 SENSITIVE Sensitive     RIFAMPIN <=0.5 SENSITIVE Sensitive     Inducible Clindamycin NEGATIVE Sensitive     * MODERATE STAPHYLOCOCCUS AUREUS  Culture, respiratory (non-expectorated)     Status: None   Collection Time: 02/22/19  5:30 PM   Specimen: Bronchoalveolar Lavage; Respiratory  Result Value Ref Range Status   Specimen Description   Final    BRONCHIAL ALVEOLAR LAVAGE Performed at [facility] [address], [place], [place] [address]    Special Requests   Final    NONE Performed at [facility], [place] [address] [place], [place] [address]    Gram Stain   Final    ABUNDANT WBC PRESENT, PREDOMINANTLY PMN MODERATE GRAM POSITIVE COCCI Performed at [facility], [place] [address] [place],  [address]    Culture MODERATE STAPHYLOCOCCUS AUREUS  Final   Report Status 02/25/2019 FINAL  Final   Organism ID, Bacteria STAPHYLOCOCCUS AUREUS  Final      Susceptibility   Staphylococcus aureus - MIC*    CIPROFLOXACIN <=0.5 SENSITIVE Sensitive     ERYTHROMYCIN >=8 RESISTANT Resistant     GENTAMICIN <=0.5 SENSITIVE Sensitive     OXACILLIN 0.5 SENSITIVE Sensitive     TETRACYCLINE <=1 SENSITIVE Sensitive     VANCOMYCIN 1 SENSITIVE Sensitive     TRIMETH/SULFA <=10 SENSITIVE Sensitive     CLINDAMYCIN <=0.25 SENSITIVE Sensitive     RIFAMPIN <=0.5 SENSITIVE Sensitive     Inducible Clindamycin NEGATIVE Sensitive     * MODERATE STAPHYLOCOCCUS AUREUS  Culture, blood (Routine X 2) w Reflex to ID Panel     Status: None   Collection Time: 02/24/19 11:47 AM   Specimen: BLOOD LEFT HAND  Result Value Ref Range Status   Specimen Description   Final    BLOOD LEFT HAND Performed at [facility] [address] [place], [place] [address]    Special Requests   Final    BOTTLES DRAWN AEROBIC AND ANAEROBIC Blood Culture adequate volume Performed at [facility]  [address] [place], [place] [address]    Culture   Final    NO GROWTH 5 DAYS Performed at [facility], [place] [address], [place], [place] [address]    Report Status 03/01/2019 FINAL  Final  Body fluid culture     Status: None   Collection Time: 03/01/19  1:30 PM   Specimen: Chest; Body Fluid  Result Value Ref Range Status   Specimen Description   Final    CHEST Performed at [facility] [address], [place], [place] [address]    Special Requests   Final    Normal Performed at [facility], [place] [address], [place], [place] [address]    Gram Stain   Final    MODERATE WBC PRESENT, PREDOMINANTLY PMN FEW GRAM POSITIVE COCCI IN CLUSTERS Performed at [facility], [place] [address] [place], [place] [address]    Culture RARE STAPHYLOCOCCUS AUREUS  Final   Report Status 03/04/2019 FINAL  Final   Organism ID, Bacteria STAPHYLOCOCCUS AUREUS  Final      Susceptibility   Staphylococcus aureus - MIC*    CIPROFLOXACIN <=0.5 SENSITIVE Sensitive     ERYTHROMYCIN >=8 RESISTANT Resistant     GENTAMICIN <=0.5 SENSITIVE Sensitive     OXACILLIN 0.5 SENSITIVE Sensitive     TETRACYCLINE <=1 SENSITIVE Sensitive     VANCOMYCIN <=0.5 SENSITIVE Sensitive     TRIMETH/SULFA <=10 SENSITIVE Sensitive     CLINDAMYCIN <=0.25 SENSITIVE Sensitive     RIFAMPIN <=0.5 SENSITIVE Sensitive     Inducible Clindamycin NEGATIVE Sensitive     * RARE STAPHYLOCOCCUS AUREUS  Culture, blood (routine x 2)     Status: None   Collection Time: 03/02/19  7:10 PM   Specimen: BLOOD RIGHT ARM  Result Value Ref Range Status   Specimen Description   Final    BLOOD RIGHT ARM Performed at [facility], [address], [place], [place] [address]    Special Requests   Final    BOTTLES DRAWN AEROBIC AND ANAEROBIC Blood Culture adequate volume Performed at [facility] [address], [place], [place] [address]    Culture   Final    NO GROWTH 5  DAYS Performed at [facility], [place] [address] [place], [place] [address]    Report Status 03/07/2019 FINAL  Final  Culture, blood (routine x 2)     Status: None   Collection Time: 03/02/19  7:17 PM   Specimen: BLOOD LEFT ARM  Result Value Ref Range Status   Specimen Description   Final    BLOOD LEFT ARM Performed at [facility], [place] [address] [place], [place] [address]    Special Requests   Final    BOTTLES DRAWN AEROBIC ONLY Blood Culture adequate volume Performed at [facility] [address], [place], [place] [address]    Culture   Final    NO GROWTH  5 DAYS Performed at [facility], [place] [address], [place], [place] [address]    Report Status 03/07/2019 FINAL  Final    Studies/Results: Mr Lumbar Spine W Wo Contrast  Result Date: 03/14/2019 CLINICAL DATA:  Back pain, infection suspected. Patient with disseminated MSSA infection multiple abscesses. Worsening lower back pain not responding to pain management. Follow-up known osteomyelitis in spine. EXAM: MRI LUMBAR SPINE WITHOUT AND WITH CONTRAST TECHNIQUE: Multiplanar and multiecho pulse sequences of the lumbar spine were obtained without and with intravenous contrast. CONTRAST:  27m GADAVIST GADOBUTROL 1 MMOL/ML IV SOLN COMPARISON:  Lumbar spine MRI 02/21/2019 FINDINGS: Segmentation: For the purposes of this dictation, five lumbar vertebrae are assumed and the caudal most well-formed intervertebral disc is designated L5-S1. Alignment: Straightening of the expected lumbar lordosis. No significant spondylolisthesis. Lumbar levocurvature. Vertebrae: Vertebral body height is maintained. Endplate irregularity at L4-L5 has increased as compared to prior examination. Again demonstrated there is extensive abnormal T2 hyperintense signal throughout the L4 and L5 vertebrae extending to the disc space with associated abnormal enhancement. Findings consistent with discitis/osteomyelitis. As before, there  is fluid within the bilateral L4-L5 facet joints (greater on the right) and septic arthritis is difficult to exclude (series 11, image 29). More conspicuous than on prior examination, there is abnormal T2 hyperintensity and enhancement within the L2 and L3 vertebrae along the disc space. Findings highly suspicious for an additional level of discitis/osteomyelitis. Conus medullaris and cauda equina: Conus extends to the L2 level. No signal abnormality within the visualized distal spinal cord. New from prior MRI, there is ventral epidural phlegmon at the L4-L5 level measuring 0.6 cm in AP dimension (series 10, image 7). Paraspinal and other soft tissues: There is nonspecific soft tissue edema and enhancement within the lumbar paraspinal soft tissues, greatest at L4-L5 level. More conspicuous than on prior examination. There is abnormal paraspinal enhancing soft tissue consistent with phlegmon at the L4-L5 level extending to involve both psoas muscles. This extends into the bilateral L4-L5 neural foramina and partially into the bilateral L5-S1 neural foramina. Again demonstrated are bilateral psoas abscesses, the largest on the right measuring 2.7 x 2.4 x 4.4 cm (series 6, image 33) (series 9, image 13). Disc levels: T12-L1: No disc herniation. No significant canal or foraminal stenosis. L1-L2: No disc herniation. No significant canal or foraminal stenosis. L2-L3: Disc bulge with endplate spurring. New from prior examination there is a left center/subarticular caudally migrated disc extrusion. Mild facet arthrosis. The disc extrusion contributes to significant left subarticular stenosis with encroachment upon the descending left L3 nerve root. No significant central canal stenosis or neural foraminal narrowing. L3-L4: Disc bulge asymmetric to the left with associated endplate spurring. Mild facet arthrosis. Mild left subarticular and bilateral neural foraminal narrowing. No significant central canal stenosis. L4-L5:  Disc bulge with endplate remodeling. Ventral epidural phlegmon as described. No significant spinal canal stenosis. As before, abnormal enhancement likely reflecting phlegmon extends into both neural foramina with severe bilateral neural foraminal narrowing. L5-S1: Small disc bulge. Mild facet arthrosis. No significant spinal canal stenosis. Abnormal soft tissue enhancement likely reflecting phlegmon extends into the bilateral neural foramina. IMPRESSION: 1. Redemonstrated findings of L4-L5 discitis/osteomyelitis. Endplate irregularity at this level has progressed. Persistent fluid within the bilateral L4-L5 facet joints (greater on the right). For which septic arthritis is difficult to exclude. New ventral epidural phlegmon at this level measuring 0.6 cm in AP dimension without significant canal stenosis. Persistent extensive paraspinal phlegmon at this level extending into the bilateral L4-L5  neural foramina with resultant severe neural foraminal narrowing. Phlegmon also extends partially into the bilateral L5-S1 neural foramina. 2. Signal abnormality and abnormal enhancement now present at the L2-L3 level also suspicious for discitis/osteomyelitis. 3. Redemonstrated bilateral psoas abscesses, the largest on the right measuring 2.7 x 2.4 x 4.4 cm. 4. New L2-L3 left center/subarticular caudally migrated disc extrusion. Resultant left subarticular stenosis with encroachment upon the descending left L3 nerve root. Electronically Signed   By: Kellie Simmering DO   On: 03/14/2019 13:07   Dg Chest Port 1 View  Result Date: 03/15/2019 CLINICAL DATA:  Hydropneumothorax. EXAM: PORTABLE CHEST 1 VIEW COMPARISON:  March 14, 2019. FINDINGS: The heart size and mediastinal contours are within normal limits. Stable mild left apical pneumothorax is noted. Stable bilateral nodular densities are noted some of which are cavitary, consistent with septic emboli. The visualized skeletal structures are unremarkable. IMPRESSION: Stable  mild left apical pneumothorax is noted. Stable bilateral nodular densities are noted, some of which are cavitary, consistent with septic emboli. Electronically Signed   By: Marijo Conception M.D.   On: 03/15/2019 13:03   Dg Chest Port 1 View  Result Date: 03/14/2019 CLINICAL DATA:  Chest tube removal EXAM: PORTABLE CHEST 1 VIEW COMPARISON:  03/14/2019, 03/13/2019, 03/10/2019 FINDINGS: Interim removal of left-sided chest tube. Overall no significant interval change in size of small residual left apicolateral pneumothorax. There is probable trace pneumothorax at the left CP angle. Stable cardiomediastinal silhouette. Multiple bilateral nodules and cavitary lesions as before. IMPRESSION: 1. Removal of left chest tube. 2. Overall no significant interval change in size of small residual left apicolateral pneumothorax. Now seen is a small amount of pneumothorax component at the left CP angle. 3. Stable bilateral lung nodules and cavitary lesions. Electronically Signed   By: Donavan Foil M.D.   On: 03/14/2019 16:55   Dg Chest Port 1 View  Result Date: 03/14/2019 CLINICAL DATA:  Hydropneumothorax EXAM: PORTABLE CHEST 1 VIEW COMPARISON:  03/13/2019 FINDINGS: No significant change in AP portable examination with a persistent small left lateral and apical pneumothorax component and a left-sided pigtail chest tube in position about the lateral lung base. Probable small persistent effusion component. No change in multiple masses and nodules bilaterally, some of which are cavitary. The heart and mediastinum are unremarkable. IMPRESSION: 1. No significant change in AP portable examination with a persistent small left lateral and apical pneumothorax component and a left-sided pigtail chest tube in position about the lateral lung base. Probable small persistent effusion component. 2. No change in multiple masses and nodules bilaterally, some of which are cavitary. Electronically Signed   By: Eddie Candle M.D.   On:  03/14/2019 09:58      Assessment/Plan:  INTERVAL HISTORY:  IR have aspirated psoas abscess  Principal Problem:   Sepsis ([facility]) Active Problems:   Dyspnea   Heroin use   Endocarditis   Septic pulmonary embolism (HCC)   Osteomyelitis of thoracic spine (HCC)   Osteomyelitis of lumbar spine (HCC)   Acute encephalopathy   AKI (acute kidney injury) ([facility] [place])   Transaminitis   Psoas muscle abscess (HCC)   Pressure injury of skin   Mucus clot in bronchi   Protein-calorie malnutrition, severe   Abnormal echocardiogram   MSSA bacteremia   Acute pulmonary embolism (HCC)   Pleural effusion on left   Empyema, left ([place])   Chest tube in place   Pain   Pneumothorax   Fistula    Franklin Anderson is a 48 y.o.  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 He has septic embolization to the lungs with cavitary pathology and now loculated pneumothorax concerning for empyema he also has a pulmonary embolism. He has BP fistula.  He is sp Chest TUBE placement He does also have a small pericardial effusion.    #1 Metastatic MSSAB as described:       [place] [facility] Staphylococcus aureus bacteremia   Staphylococcus aureus bacteremia (SAB) is associated with a high rate of complications and mortality.  Specific aspects of clinical management are critical to optimizing the outcome of patients with SAB.  Therefore, the [facility] Antimicrobial Management Team [facility]) has initiated an intervention aimed at improving the management of SAB at [facility].  To do so, Infectious Diseases physicians are providing an evidence-based consult for the management of all patients with SAB.     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 Gennie Alma who wants to try current therapy rather than hazard thoracotomy at this point  Investigate for metastatic sites of infection _8  _9  Does the patient have ANY symptom or physical exam finding that would suggest a deeper infection (back or neck pain that may be suggestive of vertebral osteomyelitis or epidural abscess, muscle pain that could be a symptom of pyomyositis)?  Keep in mind that for deep seeded infections MRI imaging with contrast is preferred rather than other often insensitive tests such as plain x-rays, especially early in a patient's presentation.  Change antibiotic therapy to cefazolin _10  _11  Beta-lactam antibiotics are preferred for MSSA due to higher cure rates.   If on Vancomycin, goal trough should be 15 - 20 mcg/mL  Estimated duration of IV antibiotic therapy:  6-8 weeks but would need to be int he hospital _12  _13  Consult case management for probably prolonged outpatient IV antibiotic therapy   #2 RVentricular vegeation  Endocarditis Evaluation   Infective endocarditis (IE) is associated with a high rate of complications and mortality.  Specific aspects of clinical management are critical to optimizing the outcome of patients with IE. Therefore, the Pharmacy Residency Program at [facility] has initiated an evidence-based consult aimed at improving the management of  IE at [facility].     Comments  Consult to ID Outpatient follow-up appointment to be made prior to discharge.  Utilization of ID recommended antibiotic therapy   Surgery Evaluation Cardiothoracic surgery consult if the following indications are present:  - Heart failure associated to valve dysfunction - Endocarditis complicated by heart block or aortic abscess - Left-sided endocarditis caused by S. aureus, fungal, or multi-drug resistant organisms - Left-sided endocarditis  with severe valve dysfunction - Large vegetation (>1 cm) on aortic or mitral valve - Persistence of infection 5-7 days after initiation of appropriate antibiotic therapy - Recurrent emboli and persistent vegetations despite appropriate antibiotic therapy - Any history with left-sided embolization with persistent vegetations on TEE  Consult electrophysiologist if presence of implanted cardiac device (pacemaker, ICD)   Consult to cardiology if left ventricular ejection fraction is <40% Evaluation for appropriate heart failure medications upon discharge: - ACE/ARB - Diuretic - Beta blocker  Outpatient follow-up appointment to be made prior to discharge.   Based on the ID MD's evaluation of this patient, the following are recommended to complete a thorough evaluation and care plan to reflect guideline-recommended therapy = ID appt made.    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 Kipp Brood believes that drain intended for empyema may have been  placed into lung abscess  #5  Pericardial effusion:  repeat TTE shows it is smaller  #6 Neck pain at IJ site no clot   #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  I WOULD TREAT for 6 more weeks w IV antibiotics if possible  I will followup cultures from psoas abscess but otherwise sign off for now  Please call with further questions.  .   LOS: 21 days   Alcide Evener 03/15/2019, 4:37 PM

## 2019-03-15 NOTE — Progress Notes (Signed)
OT Cancellation Note  Patient Details Name: Franklin Anderson MRN: [id number] DOB: 12-25-1970   Cancelled Treatment:    Reason Eval/Treat Not Completed: Other (comment).  Pt declined OT; awaiting ID procedure and has been NPO.  Will check back another day  Lorenzo Arscott 03/15/2019, 3:03 PM  Lesle Chris, OTR/L Acute Rehabilitation Services [phone] [facility] pager [phone] office 03/15/2019

## 2019-03-15 NOTE — Progress Notes (Signed)
..   NAME:  Franklin Anderson, MRN:  [id number], DOB:  14-Apr-1971, LOS: 21 ADMISSION DATE:  02/21/2019, CONSULTATION DATE:  02/28/2019 REFERRING MD:  Earnest Conroy MD, CHIEF COMPLAINT:  SOB   Brief History   48 yr old M with PMHx of rheumatic fever, HTN, IV heroin abuse presented on 10/27 w/ back pain and lower ext weakness. 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 Recent TEE showed mobile mass moderate in size located in the mid/ apical  RV that appeared to be attached to a trabeculation. NP overnight evaluated patient at shift change noted to be tachypneic. New CXR 11/3/202  showed left sided loculated pneumothorax. PCCM consulted    Past Medical History  HTN Methadone Use Heroin Use Tobacco Use Rheumatic Fever   Significant Hospital Events   10/27 Admit with worsening back pain and BLE weakness.  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 I 10/28 PCCM evaluation when pt became obtunded and dyspneic. Pt was endotracheally intubated for acute hypoxemic respiratory failure. He received a Left IJ CVC.  He received IV Antibiotics for metastatic MSSAB infection, initially on Vancomycin and Cefepime tapered to Ancef.  He was evaluated with bedside echo and found to have pericardial effusion without tamponade. Formal TTE showed G!DD w EF 60% and possible mitral valve vegetation.  11/03 TEE at [facility] (w/o intubation, tolerated procedure well) and was found to have  mobile mass moderate in size located in the mid/ apical  RV that appears to be attached to a trabeculation.  11/03 NP overnight evaluated patient at shift change noted the pt to be tachypneic. CXR was completed: results were called to [facility] by NP.  It shows a new left sided loculated pneumothorax.  11/04: Spoke w/ Dr Prescott Gum via phone who reviewed imaging and recommended chest tube placement. Chest tube placed, marked improvement in pneumothorax 11/6: Pleural fluid volume much improved.   Ongoing 1 out of 7 airleak.  Suction from pleural tube increased to 30.  Awaiting cultures, currently has gram-positive cocci in clusters 11/09 still w/ airleak. Got symptomatic (more short of breath) when chest tube sxn was not functioning  11/10 on going BPF. Asked for formal thoracic eval. Concerned that we won't be able to get CT out.  11/12: CT of chest: .Persistent loculated LEFT hydropneumothorax, with decrease in pneumothorax component since previous exam.Septic emboli throughout both lungs, little changed  Pain better controlled. Ongoing BPF, with velocity of air leak improved.  11/16 CT placed to water seal   Consults:  10/28 Infectious Disease 10/28 PCCM 10/29 Cardiology 11/03 PCCM re-consult 11/04 Phone consult with thoracic surgery  Procedures:  10/28 CT guided aspiration of right psoas abscesses 10/28 Endotracheally intubated 10/28  LIJ CVC placement 10/28 Bronchoscopy with BAL 10/31 Extubated to 3L [place] by RT 11/3  Transesophageal Echo 11/4 Left-sided chest tube >> 11/17  Micro Data:  BCx2 10/27 >> + MSSA BCx2 10/28 >> + MSSA Abscess Rt Psoas 10/28 >> MSSA Resp Culture 10/28 >> MSSA BCx2 10/30 >> MSSA Pleural fluid 11/4 >> MSSA BCx2 11/5 >> negative   Antimicrobials: per ID   Cefepime10/28 >>10/29 , Vanc 10/28 >>10/30 Cefazolin 10/30 >> planned thru Apr 12 2019 with keflex x 30 d to follow    Subjective   Pt reports he has mild chest discomfort at chest tube site.  States is new post removal.  Denies need for pain medications just noted it "was new".  Appreciative of care.  Objective   Blood pressure (!) 129/97, pulse 94, temperature 97.9 F (36.6 C), temperature source Oral, resp. rate 14, height 6' (1.829 m), weight 49.9 kg, SpO2 96 %. on RA.         Intake/Output Summary (Last 24 hours) at 03/15/2019 1024 Last data filed at 03/15/2019 0950 Gross per 24 hour  Intake 1263.6 ml  Output 3250 ml  Net -1986.4 ml   Filed Weights   03/13/19 0500  03/14/19 0500 03/15/19 0522  Weight: 51.3 kg 49.8 kg 49.9 kg    Examination: General: cachectic adult male lying in bed in AND  HEENT: MM pink/moist, poor dentition, no jvd Neuro: AAOx4, speech clear, MAE, generalized weakness  CV: s1s2 rrr, no m/r/g PULM:  Even/non-labored, diminished on left, chest tube site covered with occlusive dressing  GI: soft, bsx4 active, NPO for procedure but tolerates PO's Extremities: warm/dry, no edema  Skin: no rashes or lesions, old injection sites / scarring noted, multiple tattoos   Resolved Hospital Problem list   Uremia and Hypernatremia resolved AKI resolved Acute Respiratory Failure s/p Mechanical Ventilation, Extubated on 10/31   Assessment & Plan:   Left Cavitary PNA with Numerous Rounded and Cavitary fluid-filled lesions  Hydropneumothorax concerning for Empyema BPF Septic VTE Pulmonary Emboli  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 CT Chest 11/11 demonstrated distant loculated left hydropneumothorax with decreasing air component multiple septic emboli throughout both lungs without significant change. Status post ultrasound guided tube thoracostomy on 11/14.  Removed 11/17 after 24 hours on water seal. Post removal CXR with residual pneumothorax, not likely to re-expand.    P: Continue antibiotics per ID  Pulmonary follow up arranged, will need repeat CXR at time of follow up. Possibly repeat CT imaging.  Will re-image CXR now given new discomfort > likely pleural inflammation but r/o pneumothorax  Plan for weekly CXR to follow residual hydropneumothorax  for changes, doubt lung will re-expand Maximize nutritional support  Will need to work out plan for tapering of narcotics while inpatient  Consider repeat NSGY evaluation, defer to primary    PCCM will be available PRN. Please call if new needs arise.   Noe Gens, NP-C  Pulmonary & Critical Care 03/15/2019, 10:24 AM

## 2019-03-15 NOTE — Progress Notes (Signed)
PROGRESS NOTE    Franklin Anderson  RDE:[id number] DOB: 09/24/1970 DOA: 02/21/2019 PCP: Julieanne Manson, MD  Brief Narrative:48 y.o.malewith medical history significant of rheumatic fever, HTN, ongoingIV heroin abuse, presented to the ED on 10/27 with 3 week course of progressively worsening back pain and BLE weakness associated with some bowel incontinence and trouble walking. Last use of heroin was on the day of admission. He was found to be septic with MSSA bacteremia, endocarditis, osteomyelitis, discitis at L4 and L5, septic pulmonary emboli with loculated effusion and pneumothorax and psoas abscesses. On 10/28 was found to be obtunded and dyspneic and was intubated by critical care, extubated on 10/31. Right psoas abscess was aspirated on 10/28, wound care >MSSA. Respiratory culture from 10/28 also grew MSSA. 11/3: Patient underwent TEE and had acute respiratory distress later that evening with stat chest x-ray showing loculated pneumothorax/CT chest indicating empyema/PE/hydropneumothorax. Now seen by PCCM and post chest tube placement.Code status changed to DNR, later patient made himself full code again.   Assessment & Plan:   Principal Problem:   Sepsis (HCC) Active Problems:   Dyspnea   Heroin use   Endocarditis   Septic pulmonary embolism (HCC)   Osteomyelitis of thoracic spine (HCC)   Osteomyelitis of lumbar spine (HCC)   Acute encephalopathy   AKI (acute kidney injury) (HCC)   Transaminitis   Psoas muscle abscess (HCC)   Pressure injury of skin   Mucus clot in bronchi   Protein-calorie malnutrition, severe   Abnormal echocardiogram   MSSA bacteremia   Acute pulmonary embolism (HCC)   Pleural effusion on left   Empyema, left (HCC)   Chest tube in place   Pain   Pneumothorax   Fistula   #1 MSSA bacteremia with discitis and vertebral osteomyelitis and psoas abscess with septic pulmonary embolism/cavitary lesion and mural vegetation- CT with persistent  loculated left hydropneumothorax with decreased pneumothorax.  Will septic emboli throughout both lungs remain unchanged. CT surgery thinks he is not a good candidate for thoracotomy and muscle flap closure. Followed by ID recommending 6 weeks of Ancef in the hospital and then discharged home on Keflex for another 30 days. Ancef was started 02/24/2019.  #2 back pain secondary to bilateral psoas abscess with progression of discitis and osteomyelitis with nerve compression and worsening of stenosis.  Patient had CT-guided right psoas aspiration by IR 02/22/2019 yielding 18 mL of purulent fluid with MSSA.  Patient has recurrent psoas abscess.  IR to drain his abscess today.  #3 acute hypoxic respiratory failure due to multiple pulmonary embolism and sepsis patient intubated 02/22/2019 and extubated 02/25/2019.  #4 pericardial effusion noted on the echo none on repeat CT scan.  #5 acute metabolic encephalopathy resolved.  #6 AKI resolved.  #7 hypernatremia resolved.  #8 hypertension blood pressure 111/86.  Decrease Norvasc to 5 mg daily.  Continue metoprolol.  Monitor daily.   #9 pressure injury with stage I continue wound care  #10 severe protein calorie malnutrition severe albuminemia dietary consulted and is following.  Recommending Ensure Enlive p.o. twice daily, prostat liquid protein 3 times daily with meals, Magic cup 3 times daily with meals and multivitamins with minerals.   Pressure Injury 02/22/19 Sacrum Mid Stage I -  Intact skin with non-blanchable redness of a localized area usually over a bony prominence. (Active)  02/22/19 0420  Location: Sacrum  Location Orientation: Mid  Staging: Stage I -  Intact skin with non-blanchable redness of a localized area usually over a bony prominence.  Wound  Description (Comments):   Present on Admission: Yes     Pressure Injury 03/13/19 Hip Right Deep Tissue Injury - Purple or maroon localized area of discolored intact skin or blood-filled  blister due to damage of underlying soft tissue from pressure and/or shear. purple area, skin intact (Active)  03/13/19 0800  Location: Hip  Location Orientation: Right  Staging: Deep Tissue Injury - Purple or maroon localized area of discolored intact skin or blood-filled blister due to damage of underlying soft tissue from pressure and/or shear.  Wound Description (Comments): purple area, skin intact  Present on Admission: No      Nutrition Problem: Severe Malnutrition Etiology: social / environmental circumstances(IVDU)     Signs/Symptoms: severe fat depletion, severe muscle depletion    Interventions: Ensure Enlive (each supplement provides 350kcal and 20 grams of protein), MVI  Estimated body mass index is 14.92 kg/m as calculated from the following:   Height as of this encounter: 6' (1.829 m).   Weight as of this encounter: 49.9 kg.  DVT prophylaxis Heparin  code Status: Full code  family Communication: None Disposition Plan: Pending clinical improvement and completion of IV Ancef   consultants:   PCCM, cardiology, ID, palliative care, cardiothoracic surgery  Procedures: CT-guided drainage of right psoas abscess 02/22/2019 Antimicrobials: Ancef started 02/24/2019 Subjective: Patient resting in bed denies any new complaints  Objective: Vitals:   03/14/19 2031 03/14/19 2200 03/15/19 0522 03/15/19 1343  BP: (!) 139/97 138/87 (!) 129/97 111/86  Pulse: (!) 110 (!) 102 94 85  Resp: (!) Temp: 98.1 F (36.7 C)  97.9 F (36.6 C) 97.7 F (36.5 C)  TempSrc: Oral  Oral Oral  SpO2: 96%  96% 96%  Weight:   49.9 kg   Height:        Intake/Output Summary (Last 24 hours) at 03/15/2019 1355 Last data filed at 03/15/2019 1328 Gross per 24 hour  Intake 1263.6 ml  Output 3300 ml  Net -2036.4 ml   Filed Weights   03/13/19 0500 03/14/19 0500 03/15/19 0522  Weight: 51.3 kg 49.8 kg 49.9 kg    Examination:  General exam: Appears calm and comfortable    Respiratory system: Clear to auscultation. Respiratory effort normal. Cardiovascular system: S1 & S2 heard, RRR. No JVD, murmurs, rubs, gallops or clicks. No pedal edema. Gastrointestinal system: Abdomen is nondistended, soft and nontender. No organomegaly or masses felt. Normal bowel sounds heard.  Lower lumbar spinal tenderness Central nervous system: Alert and oriented. No focal neurological deficits. Extremities: No edema Skin: No rashes, lesions or ulcers Psychiatry: Judgement and insight appear normal. Mood & affect appropriate.     Data Reviewed: I have personally reviewed following labs and imaging studies  CBC: Recent Labs  Lab 03/09/19 0916 03/10/19 1029 03/11/19 1124 03/13/19 0936 03/15/19 0545  WBC 7.2 7.4 8.4 7.7 6.8  NEUTROABS  --   --   --   --  3.8  HGB 9.0* 8.3* 8.5* 8.0* 8.8*  HCT 28.3* 26.3* 26.9* 26.1* 28.8*  MCV 92.2 91.6 93.4 94.2 93.8  PLT 349 363 419* 434* 548*   Basic Metabolic Panel: Recent Labs  Lab 03/11/19 1124 03/13/19 0936 03/15/19 0545  NA 134* 133* 133*  K 4.8 4.1 4.3  CL 97* 96* 97*  CO2 GLUCOSE 108* 122* 99  BUN CREATININE 0.38* 0.38* 0.36*  CALCIUM 8.0* 8.2* 8.4*   GFR: Estimated Creatinine Clearance: 78.8 mL/min (A) (by C-G formula based  on SCr of 0.36 mg/dL (L)). Liver Function Tests: Recent Labs  Lab 03/15/19 0545  AST 25  ALT 21  ALKPHOS 99  BILITOT 0.2*  PROT 6.8  ALBUMIN 2.0*   No results for input(s): LIPASE, AMYLASE in the last 168 hours. No results for input(s): AMMONIA in the last 168 hours. Coagulation Profile: Recent Labs  Lab 03/15/19 0545  INR 1.2   Cardiac Enzymes: No results for input(s): CKTOTAL, CKMB, CKMBINDEX, TROPONINI in the last 168 hours. BNP (last 3 results) No results for input(s): PROBNP in the last 8760 hours. HbA1C: No results for input(s): HGBA1C in the last 72 hours. CBG: Recent Labs  Lab 03/14/19 1236 03/14/19 1709 03/14/19 2036 03/15/19 0753  03/15/19 1152  GLUCAP 157* 110* 145* 101* 105*   Lipid Profile: No results for input(s): CHOL, HDL, LDLCALC, TRIG, CHOLHDL, LDLDIRECT in the last 72 hours. Thyroid Function Tests: No results for input(s): TSH, T4TOTAL, FREET4, T3FREE, THYROIDAB in the last 72 hours. Anemia Panel: No results for input(s): VITAMINB12, FOLATE, FERRITIN, TIBC, IRON, RETICCTPCT in the last 72 hours. Sepsis Labs: No results for input(s): PROCALCITON, LATICACIDVEN in the last 168 hours.  No results found for this or any previous visit (from the past 240 hour(s)).       Radiology Studies: Mr Lumbar Spine W Wo Contrast  Result Date: 03/14/2019 CLINICAL DATA:  Back pain, infection suspected. Patient with disseminated MSSA infection multiple abscesses. Worsening lower back pain not responding to pain management. Follow-up known osteomyelitis in spine. EXAM: MRI LUMBAR SPINE WITHOUT AND WITH CONTRAST TECHNIQUE: Multiplanar and multiecho pulse sequences of the lumbar spine were obtained without and with intravenous contrast. CONTRAST:  5mL GADAVIST GADOBUTROL 1 MMOL/ML IV SOLN COMPARISON:  Lumbar spine MRI 02/21/2019 FINDINGS: Segmentation: For the purposes of this dictation, five lumbar vertebrae are assumed and the caudal most well-formed intervertebral disc is designated L5-S1. Alignment: Straightening of the expected lumbar lordosis. No significant spondylolisthesis. Lumbar levocurvature. Vertebrae: Vertebral body height is maintained. Endplate irregularity at L4-L5 has increased as compared to prior examination. Again demonstrated there is extensive abnormal T2 hyperintense signal throughout the L4 and L5 vertebrae extending to the disc space with associated abnormal enhancement. Findings consistent with discitis/osteomyelitis. As before, there is fluid within the bilateral L4-L5 facet joints (greater on the right) and septic arthritis is difficult to exclude (series 11, image 29). More conspicuous than on prior  examination, there is abnormal T2 hyperintensity and enhancement within the L2 and L3 vertebrae along the disc space. Findings highly suspicious for an additional level of discitis/osteomyelitis. Conus medullaris and cauda equina: Conus extends to the L2 level. No signal abnormality within the visualized distal spinal cord. New from prior MRI, there is ventral epidural phlegmon at the L4-L5 level measuring 0.6 cm in AP dimension (series 10, image 7). Paraspinal and other soft tissues: There is nonspecific soft tissue edema and enhancement within the lumbar paraspinal soft tissues, greatest at L4-L5 level. More conspicuous than on prior examination. There is abnormal paraspinal enhancing soft tissue consistent with phlegmon at the L4-L5 level extending to involve both psoas muscles. This extends into the bilateral L4-L5 neural foramina and partially into the bilateral L5-S1 neural foramina. Again demonstrated are bilateral psoas abscesses, the largest on the right measuring 2.7 x 2.4 x 4.4 cm (series 6, image 33) (series 9, image 13). Disc levels: T12-L1: No disc herniation. No significant canal or foraminal stenosis. L1-L2: No disc herniation. No significant canal or foraminal stenosis. L2-L3: Disc bulge with endplate spurring.  New from prior examination there is a left center/subarticular caudally migrated disc extrusion. Mild facet arthrosis. The disc extrusion contributes to significant left subarticular stenosis with encroachment upon the descending left L3 nerve root. No significant central canal stenosis or neural foraminal narrowing. L3-L4: Disc bulge asymmetric to the left with associated endplate spurring. Mild facet arthrosis. Mild left subarticular and bilateral neural foraminal narrowing. No significant central canal stenosis. L4-L5: Disc bulge with endplate remodeling. Ventral epidural phlegmon as described. No significant spinal canal stenosis. As before, abnormal enhancement likely reflecting phlegmon  extends into both neural foramina with severe bilateral neural foraminal narrowing. L5-S1: Small disc bulge. Mild facet arthrosis. No significant spinal canal stenosis. Abnormal soft tissue enhancement likely reflecting phlegmon extends into the bilateral neural foramina. IMPRESSION: 1. Redemonstrated findings of L4-L5 discitis/osteomyelitis. Endplate irregularity at this level has progressed. Persistent fluid within the bilateral L4-L5 facet joints (greater on the right). For which septic arthritis is difficult to exclude. New ventral epidural phlegmon at this level measuring 0.6 cm in AP dimension without significant canal stenosis. Persistent extensive paraspinal phlegmon at this level extending into the bilateral L4-L5 neural foramina with resultant severe neural foraminal narrowing. Phlegmon also extends partially into the bilateral L5-S1 neural foramina. 2. Signal abnormality and abnormal enhancement now present at the L2-L3 level also suspicious for discitis/osteomyelitis. 3. Redemonstrated bilateral psoas abscesses, the largest on the right measuring 2.7 x 2.4 x 4.4 cm. 4. New L2-L3 left center/subarticular caudally migrated disc extrusion. Resultant left subarticular stenosis with encroachment upon the descending left L3 nerve root. Electronically Signed   By: Jackey LogeKyle  Golden DO   On: 03/14/2019 13:07   Dg Chest Port 1 View  Result Date: 03/15/2019 CLINICAL DATA:  Hydropneumothorax. EXAM: PORTABLE CHEST 1 VIEW COMPARISON:  March 14, 2019. FINDINGS: The heart size and mediastinal contours are within normal limits. Stable mild left apical pneumothorax is noted. Stable bilateral nodular densities are noted some of which are cavitary, consistent with septic emboli. The visualized skeletal structures are unremarkable. IMPRESSION: Stable mild left apical pneumothorax is noted. Stable bilateral nodular densities are noted, some of which are cavitary, consistent with septic emboli. Electronically Signed   By:  Lupita RaiderJames  Green Jr M.D.   On: 03/15/2019 13:03   Dg Chest Port 1 View  Result Date: 03/14/2019 CLINICAL DATA:  Chest tube removal EXAM: PORTABLE CHEST 1 VIEW COMPARISON:  03/14/2019, 03/13/2019, 03/10/2019 FINDINGS: Interim removal of left-sided chest tube. Overall no significant interval change in size of small residual left apicolateral pneumothorax. There is probable trace pneumothorax at the left CP angle. Stable cardiomediastinal silhouette. Multiple bilateral nodules and cavitary lesions as before. IMPRESSION: 1. Removal of left chest tube. 2. Overall no significant interval change in size of small residual left apicolateral pneumothorax. Now seen is a small amount of pneumothorax component at the left CP angle. 3. Stable bilateral lung nodules and cavitary lesions. Electronically Signed   By: Jasmine PangKim  Fujinaga M.D.   On: 03/14/2019 16:55   Dg Chest Port 1 View  Result Date: 03/14/2019 CLINICAL DATA:  Hydropneumothorax EXAM: PORTABLE CHEST 1 VIEW COMPARISON:  03/13/2019 FINDINGS: No significant change in AP portable examination with a persistent small left lateral and apical pneumothorax component and a left-sided pigtail chest tube in position about the lateral lung base. Probable small persistent effusion component. No change in multiple masses and nodules bilaterally, some of which are cavitary. The heart and mediastinum are unremarkable. IMPRESSION: 1. No significant change in AP portable examination with a persistent small left  lateral and apical pneumothorax component and a left-sided pigtail chest tube in position about the lateral lung base. Probable small persistent effusion component. 2. No change in multiple masses and nodules bilaterally, some of which are cavitary. Electronically Signed   By: Eddie Candle M.D.   On: 03/14/2019 09:58        Scheduled Meds:  amLODipine  10 mg Oral Daily   Chlorhexidine Gluconate Cloth  6 each Topical Daily   feeding supplement (ENSURE ENLIVE)  237 mL  Oral BID BM   feeding supplement (PRO-STAT SUGAR FREE 64)  30 mL Oral TID   insulin aspart  0-24 Units Subcutaneous TID AC & HS   lidocaine  1 patch Transdermal Q24H   mouth rinse  15 mL Mouth Rinse BID   metoprolol tartrate  50 mg Oral BID   multivitamin with minerals  1 tablet Oral Daily   oxyCODONE  15 mg Oral Q12H   pantoprazole  40 mg Oral Daily   polyethylene glycol  17 g Oral Daily   sodium chloride flush  10-40 mL Intracatheter Q12H   Continuous Infusions:  sodium chloride 250 mL (03/07/19 2139)    ceFAZolin (ANCEF) IV 2 g (03/15/19 0501)   sodium chloride       LOS: 21 days     Georgette Shell, MD [facility]  If 7PM-7AM, please contact night-coverage [email or web address] Password [email or web address] 03/15/2019, 1:55 PM

## 2019-03-15 NOTE — Procedures (Signed)
Pre procedural Dx: Paraspinal abscess Post procedural Dx: Same  Technically successful CT guided placed of a 10 Fr drainage catheter placement into the recurrent right paraspinal abscess yielding 15 cc of purulent material.    All aspirated samples sent to the laboratory for analysis.    EBL: Trace Complications: None immediate  Ronny Bacon, MD Pager #: [phone]

## 2019-03-16 ENCOUNTER — Inpatient Hospital Stay (HOSPITAL_COMMUNITY): Payer: Self-pay

## 2019-03-16 LAB — GLUCOSE, CAPILLARY
Glucose-Capillary: 140 mg/dL — ABNORMAL HIGH (ref 70–99)
Glucose-Capillary: 134 mg/dL — ABNORMAL HIGH (ref 70–99)
Glucose-Capillary: 167 mg/dL — ABNORMAL HIGH (ref 70–99)
Glucose-Capillary: 138 mg/dL — ABNORMAL HIGH (ref 70–99)

## 2019-03-16 IMAGING — DX DG CHEST 1V
1 series · 1 of 1 positions shown · non-contrast
Comparison: [DATE].

CLINICAL DATA: Pleural effusion.

EXAM:
CHEST  1 VIEW

[chest ap]
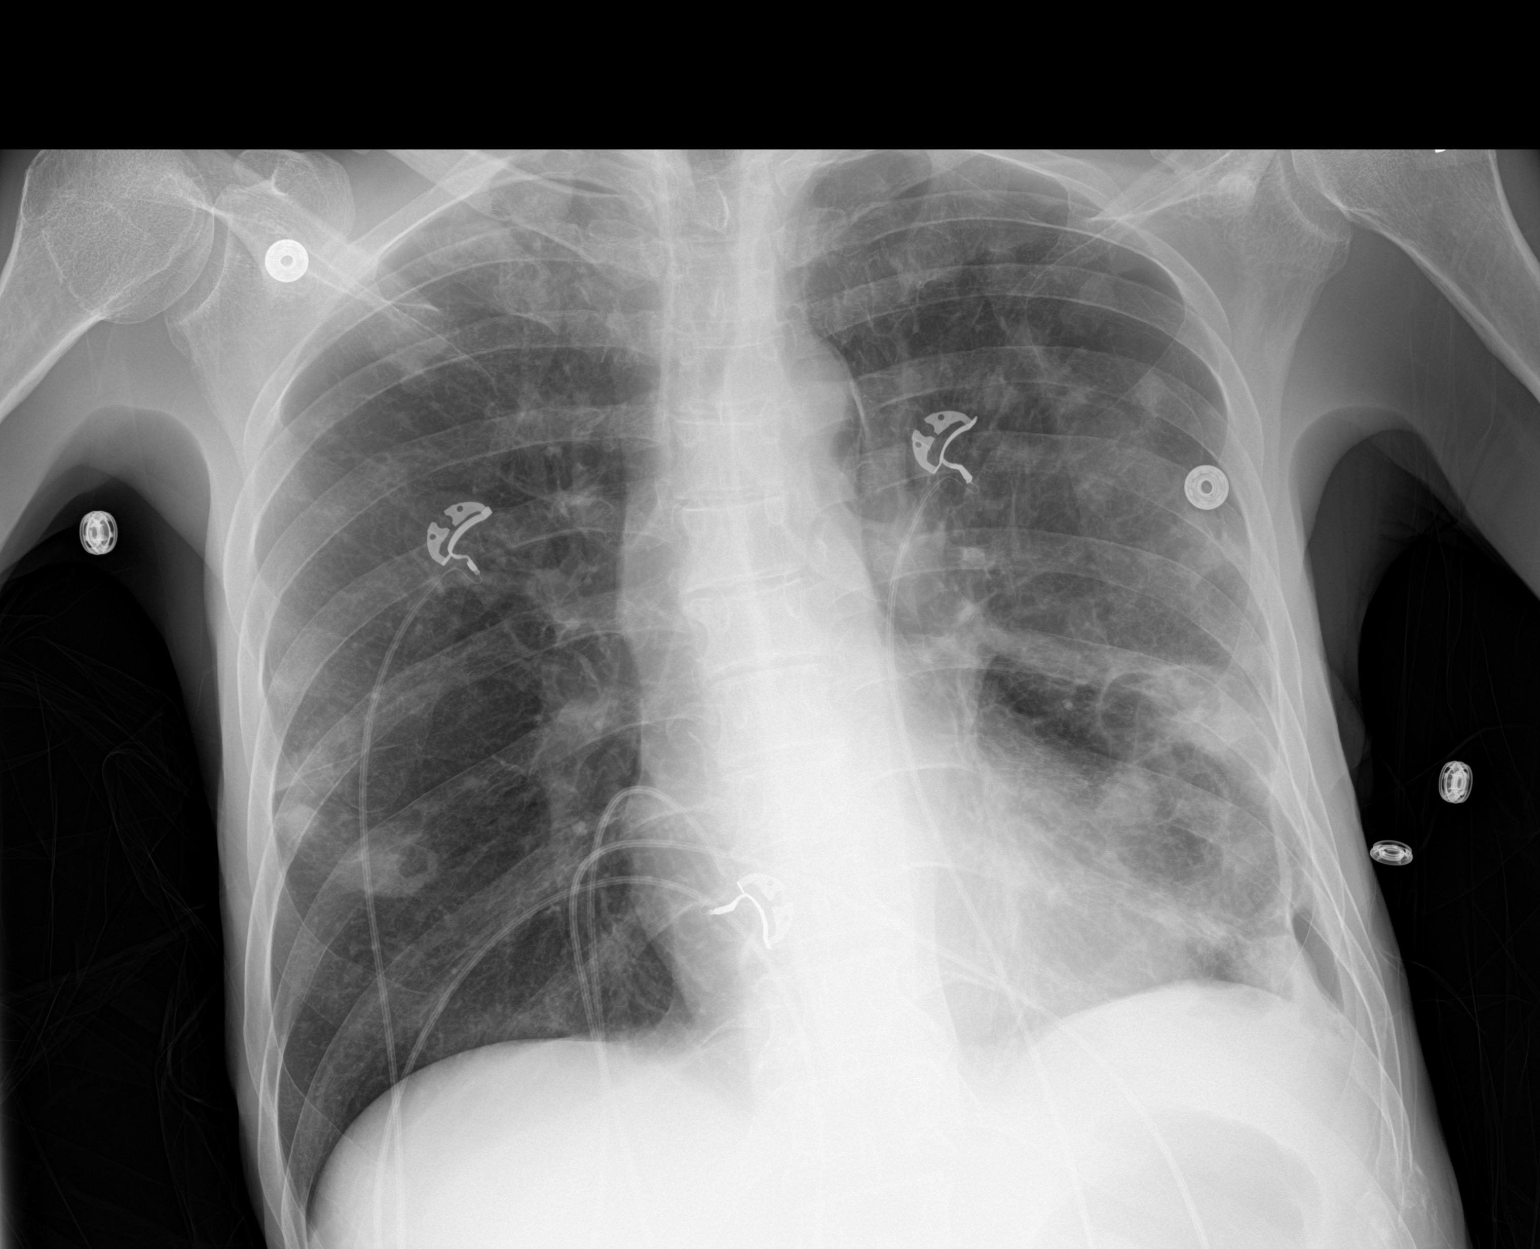

[1 of 1 positions shown; findings below may reference images not displayed]

FINDINGS: The heart size and mediastinal contours are within normal limits.
Stable mild left apical and basilar pneumothorax is noted. Stable
bilateral nodular densities are noted, some of which are cavitary,
consistent with septic emboli. The visualized skeletal structures
are unremarkable.
IMPRESSION: Stable mild left pneumothorax. Stable bilateral nodular densities
are again noted consistent with septic emboli.

## 2019-03-16 MED ORDER — OXYCODONE HCL 5 MG PO TABS
5.0000 mg | ORAL_TABLET | Freq: Four times a day (QID) | ORAL | Status: DC | PRN
  Administered 2019-03-16 – 2019-04-04 (×53): 5 mg via ORAL
  Filled 2019-03-16 (×55): qty 1

## 2019-03-16 NOTE — Progress Notes (Signed)
PROGRESS NOTE    Franklin Anderson  WUJ:[id number] DOB: [birth year] DOA: 02/21/2019 PCP: Julieanne MansonMulberry, Hommer Cunliffe, MD    Brief Narrative: 48 y.o.malewith medical history significant of rheumatic fever, HTN, ongoingIV heroin abuse, presented to the ED on 10/27 with 3 week course of progressively worsening back pain and BLE weakness associated with some bowel incontinence and trouble walking. Last use of heroin was on the day of admission. He was found to be septic with MSSA bacteremia, endocarditis, osteomyelitis, discitis at L4 and L5, septic pulmonary emboli with loculated effusion and pneumothorax and psoas abscesses. On 10/28 was found to be obtunded and dyspneic and was intubated by critical care, extubated on 10/31. Right psoas abscess was aspirated on 10/28, wound care >MSSA. Respiratory culture from 10/28 also grew MSSA. 11/3: Patient underwent TEE and had acute respiratory distress later that evening with stat chest x-ray showing loculated pneumothorax/CT chest indicating empyema/PE/hydropneumothorax. Now seen by PCCM and post chest tube placement.[facility] patient made himself full code again.  Assessment & Plan:   Principal Problem:   Sepsis (HCC) Active Problems:   Dyspnea   Heroin use   Endocarditis   Septic pulmonary embolism (HCC)   Osteomyelitis of thoracic spine (HCC)   Osteomyelitis of lumbar spine (HCC)   Acute encephalopathy   AKI (acute kidney injury) (HCC)   Transaminitis   Psoas abscess (HCC)   Pressure injury of skin   Mucus clot in bronchi   Protein-calorie malnutrition, severe   Abnormal echocardiogram   MSSA bacteremia   Acute pulmonary embolism (HCC)   Pleural effusion on left   Empyema, left (HCC)   Chest tube in place   Pain   Pneumothorax on left   Fistula   Epidural abscess  #1 MSSA bacteremia with discitis and vertebral osteomyelitis and psoas abscess with septic pulmonary embolism/cavitary lesion and right ventricular  vegetation- CT with persistent loculated left hydropneumothorax with decreased pneumothorax.  Will septic emboli throughout both lungs remain unchanged. CT surgery thinks he is not a good candidate for thoracotomy and muscle flap closure. Followed by ID recommending 6 weeks of Ancef in the hospital and then discharged home on Keflex for another 30 days. Ancef was started 02/24/2019. He is status post chest tube placement for bronchopleural fistula. Paraspinal abscess fluid culture 03/15/2019 abend WBC predominantly polymorphonuclear leukocytes no organisms or no growth. PT OT out of bed ambulate  #2 back pain secondary to bilateral psoas abscess with progression of discitis and osteomyelitis with nerve compression and worsening of stenosis.  Patient had CT-guided right psoas aspiration by IR 02/22/2019 yielding 18 mL of purulent fluid with MSSA.  Patient has recurrent psoas abscess.  He is status post CT-guided placement of drainage catheter to the recurrent right paraspinal abscess yielded 15 cc of purulent material 03/16/2019.  This a.m. it is draining serosanguineous liquid.  #3 acute hypoxic respiratory failure due to multiple pulmonary embolism and sepsis patient intubated 02/22/2019 and extubated 02/25/2019.  Patient has loculated effusion with pneumothorax and bronchopleural fistula he is status post chest tube placement which is out now.  Chest x-ray to be followed up today.  #4 pericardial effusion noted on the echo none on repeat CT scan.  #5 acute metabolic encephalopathy resolved.  #6 AKI resolved.  #7 hypernatremia resolved.  #8 hypertension blood pressure 111/86 yesterday improved to 126/87 by decreasing the dose of Norvasc to 5 mg daily.  Continue metoprolol. Monitor daily.    #9 pressure injury with stage I continue wound care  #  10 severe protein calorie malnutrition severe albuminemia dietary consulted and is following.  Recommending Ensure Enlive p.o. twice daily,  prostat liquid protein 3 times daily with meals, Magic cup 3 times daily with meals and multivitamins with minerals.   #11 hepatitis C patient needs HBV and HAV vaccination prior to discharge. Pressure Injury 02/22/19 Sacrum Mid Stage I -  Intact skin with non-blanchable redness of a localized area usually over a bony prominence. (Active)  02/22/19 0420  Location: Sacrum  Location Orientation: Mid  Staging: Stage I -  Intact skin with non-blanchable redness of a localized area usually over a bony prominence.  Wound Description (Comments):   Present on Admission: Yes     Pressure Injury 03/13/19 Hip Right Deep Tissue Injury - Purple or maroon localized area of discolored intact skin or blood-filled blister due to damage of underlying soft tissue from pressure and/or shear. purple area, skin intact (Active)  03/13/19 0800  Location: Hip  Location Orientation: Right  Staging: Deep Tissue Injury - Purple or maroon localized area of discolored intact skin or blood-filled blister due to damage of underlying soft tissue from pressure and/or shear.  Wound Description (Comments): purple area, skin intact  Present on Admission: No      Nutrition Problem: Severe Malnutrition Etiology: social / environmental circumstances(IVDU)     Signs/Symptoms: severe fat depletion, severe muscle depletion    Interventions: Ensure Enlive (each supplement provides 350kcal and 20 grams of protein), MVI  Estimated body mass index is 14.92 kg/m as calculated from the following:   Height as of this encounter: 6' 0.01" (1.829 m).   Weight as of this encounter: 49.9 kg. DVT prophylaxis Heparin  code Status: Full code  family Communication: None Disposition Plan: Pending clinical improvement and completion of IV Ancef   consultants:   PCCM, cardiology, ID, palliative care, cardiothoracic surgery  Procedures: CT-guided drainage of right psoas abscess 02/22/2019 Antimicrobials: Ancef started  02/24/2019  Subjective: Patient resting in bed no new complaints feels his pain is controlled right psoas abscess drain insight draining serosanguineous fluid.  He reports that he lives alone and has not walked and is very unsteady on his gait.  Objective: Vitals:   03/15/19 2000 03/15/19 2112 03/15/19 2129 03/16/19 0503  BP:  119/83  126/87  Pulse:  (!) 117 96 99  Resp:  20  16  Temp:  98.6 F (37 C)  98.5 F (36.9 C)  TempSrc:  Oral  Oral  SpO2:  97%  98%  Weight: 49.9 kg     Height: 6' 0.01" (1.829 m)       Intake/Output Summary (Last 24 hours) at 03/16/2019 1140 Last data filed at 03/16/2019 0600 Gross per 24 hour  Intake 685 ml  Output 1690 ml  Net -1005 ml   Filed Weights   03/14/19 0500 03/15/19 0522 03/15/19 2000  Weight: 49.8 kg 49.9 kg 49.9 kg    Examination:  General exam: Appears calm and comfortable  Respiratory system: Clear to auscultation. Respiratory effort normal. Cardiovascular system: S1 & S2 heard, RRR. No JVD, murmurs, rubs, gallops or clicks. No pedal edema. Gastrointestinal system: Abdomen is nondistended, soft and nontender. No organomegaly or masses felt. Normal bowel sounds heard.  Right sided drain in the right psoas draining serosanguineous liquid Central nervous system: Alert and oriented. No focal neurological deficits. Extremities: Symmetric 5 x 5 power. Skin: No rashes, lesions or ulcers Psychiatry: Judgement and insight appear normal. Mood & affect appropriate.     Data Reviewed:  I have personally reviewed following labs and imaging studies  CBC: Recent Labs  Lab 03/10/19 1029 03/11/19 1124 03/13/19 0936 03/15/19 0545  WBC 7.4 8.4 7.7 6.8  NEUTROABS  --   --   --  3.8  HGB 8.3* 8.5* 8.0* 8.8*  HCT 26.3* 26.9* 26.1* 28.8*  MCV 91.6 93.4 94.2 93.8  PLT 363 419* 434* 548*   Basic Metabolic Panel: Recent Labs  Lab 03/11/19 1124 03/13/19 0936 03/15/19 0545  NA 134* 133* 133*  K 4.8 4.1 4.3  CL 97* 96* 97*  CO2 29 30 27    GLUCOSE 108* 122* 99  BUN 14 14 12   CREATININE 0.38* 0.38* 0.36*  CALCIUM 8.0* 8.2* 8.4*   GFR: Estimated Creatinine Clearance: 78.8 mL/min (A) (by C-G formula based on SCr of 0.36 mg/dL (L)). Liver Function Tests: Recent Labs  Lab 03/15/19 0545  AST 25  ALT 21  ALKPHOS 99  BILITOT 0.2*  PROT 6.8  ALBUMIN 2.0*   No results for input(s): LIPASE, AMYLASE in the last 168 hours. No results for input(s): AMMONIA in the last 168 hours. Coagulation Profile: Recent Labs  Lab 03/15/19 0545  INR 1.2   Cardiac Enzymes: No results for input(s): CKTOTAL, CKMB, CKMBINDEX, TROPONINI in the last 168 hours. BNP (last 3 results) No results for input(s): PROBNP in the last 8760 hours. HbA1C: No results for input(s): HGBA1C in the last 72 hours. CBG: Recent Labs  Lab 03/14/19 2036 03/15/19 0753 03/15/19 1152 03/15/19 2112 03/16/19 0749  GLUCAP 145* 101* 105* 204* 140*   Lipid Profile: No results for input(s): CHOL, HDL, LDLCALC, TRIG, CHOLHDL, LDLDIRECT in the last 72 hours. Thyroid Function Tests: No results for input(s): TSH, T4TOTAL, FREET4, T3FREE, THYROIDAB in the last 72 hours. Anemia Panel: No results for input(s): VITAMINB12, FOLATE, FERRITIN, TIBC, IRON, RETICCTPCT in the last 72 hours. Sepsis Labs: No results for input(s): PROCALCITON, LATICACIDVEN in the last 168 hours.  Recent Results (from the past 240 hour(s))  Aerobic/Anaerobic Culture (surgical/deep wound)     Status: None (Preliminary result)   Collection Time: 03/15/19  4:38 PM   Specimen: Abscess  Result Value Ref Range Status   Specimen Description   Final    ABSCESS PARASPINAL Performed at [facility], [address] [place], [address] [place]    Special Requests   Final    Normal Performed at [facility], [address] [place], [place]    Gram Stain   Final    ABUNDANT WBC PRESENT, PREDOMINANTLY PMN NO ORGANISMS SEEN    Culture   Final    NO GROWTH < 24  HOURS Performed at [facility], [address], [place], [address] [place]    Report Status PENDING  Incomplete         Radiology Studies: Mr Lumbar Spine W Wo Contrast  Result Date: 03/14/2019 CLINICAL DATA:  Back pain, infection suspected. Patient with disseminated MSSA infection multiple abscesses. Worsening lower back pain not responding to pain management. Follow-up known osteomyelitis in spine. EXAM: MRI LUMBAR SPINE WITHOUT AND WITH CONTRAST TECHNIQUE: Multiplanar and multiecho pulse sequences of the lumbar spine were obtained without and with intravenous contrast. CONTRAST:  77mL GADAVIST GADOBUTROL 1 MMOL/ML IV SOLN COMPARISON:  Lumbar spine MRI 02/21/2019 FINDINGS: Segmentation: For the purposes of this dictation, five lumbar vertebrae are assumed and the caudal most well-formed intervertebral disc is designated L5-S1. Alignment: Straightening of the expected lumbar lordosis. No significant spondylolisthesis. Lumbar levocurvature. Vertebrae:  Vertebral body height is maintained. Endplate irregularity at L4-L5 has increased as compared to prior examination. Again demonstrated there is extensive abnormal T2 hyperintense signal throughout the L4 and L5 vertebrae extending to the disc space with associated abnormal enhancement. Findings consistent with discitis/osteomyelitis. As before, there is fluid within the bilateral L4-L5 facet joints (greater on the right) and septic arthritis is difficult to exclude (series 11, image 29). More conspicuous than on prior examination, there is abnormal T2 hyperintensity and enhancement within the L2 and L3 vertebrae along the disc space. Findings highly suspicious for an additional level of discitis/osteomyelitis. Conus medullaris and cauda equina: Conus extends to the L2 level. No signal abnormality within the visualized distal spinal cord. New from prior MRI, there is ventral epidural phlegmon at the L4-L5 level measuring 0.6 cm in AP dimension  (series 10, image 7). Paraspinal and other soft tissues: There is nonspecific soft tissue edema and enhancement within the lumbar paraspinal soft tissues, greatest at L4-L5 level. More conspicuous than on prior examination. There is abnormal paraspinal enhancing soft tissue consistent with phlegmon at the L4-L5 level extending to involve both psoas muscles. This extends into the bilateral L4-L5 neural foramina and partially into the bilateral L5-S1 neural foramina. Again demonstrated are bilateral psoas abscesses, the largest on the right measuring 2.7 x 2.4 x 4.4 cm (series 6, image 33) (series 9, image 13). Disc levels: T12-L1: No disc herniation. No significant canal or foraminal stenosis. L1-L2: No disc herniation. No significant canal or foraminal stenosis. L2-L3: Disc bulge with endplate spurring. New from prior examination there is a left center/subarticular caudally migrated disc extrusion. Mild facet arthrosis. The disc extrusion contributes to significant left subarticular stenosis with encroachment upon the descending left L3 nerve root. No significant central canal stenosis or neural foraminal narrowing. L3-L4: Disc bulge asymmetric to the left with associated endplate spurring. Mild facet arthrosis. Mild left subarticular and bilateral neural foraminal narrowing. No significant central canal stenosis. L4-L5: Disc bulge with endplate remodeling. Ventral epidural phlegmon as described. No significant spinal canal stenosis. As before, abnormal enhancement likely reflecting phlegmon extends into both neural foramina with severe bilateral neural foraminal narrowing. L5-S1: Small disc bulge. Mild facet arthrosis. No significant spinal canal stenosis. Abnormal soft tissue enhancement likely reflecting phlegmon extends into the bilateral neural foramina. IMPRESSION: 1. Redemonstrated findings of L4-L5 discitis/osteomyelitis. Endplate irregularity at this level has progressed. Persistent fluid within the  bilateral L4-L5 facet joints (greater on the right). For which septic arthritis is difficult to exclude. New ventral epidural phlegmon at this level measuring 0.6 cm in AP dimension without significant canal stenosis. Persistent extensive paraspinal phlegmon at this level extending into the bilateral L4-L5 neural foramina with resultant severe neural foraminal narrowing. Phlegmon also extends partially into the bilateral L5-S1 neural foramina. 2. Signal abnormality and abnormal enhancement now present at the L2-L3 level also suspicious for discitis/osteomyelitis. 3. Redemonstrated bilateral psoas abscesses, the largest on the right measuring 2.7 x 2.4 x 4.4 cm. 4. New L2-L3 left center/subarticular caudally migrated disc extrusion. Resultant left subarticular stenosis with encroachment upon the descending left L3 nerve root. Electronically Signed   By: Jackey Loge DO   On: 03/14/2019 13:07   Dg Chest Port 1 View  Result Date: 03/15/2019 CLINICAL DATA:  Hydropneumothorax. EXAM: PORTABLE CHEST 1 VIEW COMPARISON:  March 14, 2019. FINDINGS: The heart size and mediastinal contours are within normal limits. Stable mild left apical pneumothorax is noted. Stable bilateral nodular densities are noted some of which are cavitary, consistent  with septic emboli. The visualized skeletal structures are unremarkable. IMPRESSION: Stable mild left apical pneumothorax is noted. Stable bilateral nodular densities are noted, some of which are cavitary, consistent with septic emboli. Electronically Signed   By: Lupita Raider M.D.   On: 03/15/2019 13:03   Dg Chest Port 1 View  Result Date: 03/14/2019 CLINICAL DATA:  Chest tube removal EXAM: PORTABLE CHEST 1 VIEW COMPARISON:  03/14/2019, 03/13/2019, 03/10/2019 FINDINGS: Interim removal of left-sided chest tube. Overall no significant interval change in size of small residual left apicolateral pneumothorax. There is probable trace pneumothorax at the left CP angle. Stable  cardiomediastinal silhouette. Multiple bilateral nodules and cavitary lesions as before. IMPRESSION: 1. Removal of left chest tube. 2. Overall no significant interval change in size of small residual left apicolateral pneumothorax. Now seen is a small amount of pneumothorax component at the left CP angle. 3. Stable bilateral lung nodules and cavitary lesions. Electronically Signed   By: Jasmine Pang M.D.   On: 03/14/2019 16:55   Ct Image Guided Drainage By Percutaneous Catheter  Result Date: 03/15/2019 INDICATION: History of intravenous drug abuse with recurrent right-sided paraspinal abscess. Patient presents today for CT-guided aspiration and/or drainage catheter placement. No, patient previously underwent CT-guided paraspinal aspiration on 02/22/2019. EXAM: CT IMAGE GUIDED DRAINAGE BY PERCUTANEOUS CATHETER COMPARISON:  Lumbar spine MRI-03/14/2019; CT-guided paraspinal abscess aspiration-02/22/2019 MEDICATIONS: The patient is currently admitted to the hospital and receiving intravenous antibiotics. The antibiotics were administered within an appropriate time frame prior to the initiation of the procedure. ANESTHESIA/SEDATION: Moderate (conscious) sedation was employed during this procedure. A total of Benadryl 25 mg IV; versed 3 mg and Fentanyl 100 mcg was administered intravenously. Moderate Sedation Time: 14 minutes. The patient's level of consciousness and vital signs were monitored continuously by radiology nursing throughout the procedure under my direct supervision. CONTRAST:  None COMPLICATIONS: None immediate. PROCEDURE: Informed written consent was obtained from the patient after a discussion of the risks, benefits and alternatives to treatment. The patient was placed prone on the CT gantry and a pre procedural CT was performed re-demonstrating the known abscess/fluid collection within the inferior aspect the right iliopsoas musculature with dominant component measuring approximately 2.6 x 2.4 cm  (25, series 2) the procedure was planned. A timeout was performed prior to the initiation of the procedure. The skin overlying the posterior inferior back was prepped and draped in the usual sterile fashion. The overlying soft tissues were anesthetized with 1% lidocaine with epinephrine. Appropriate trajectory was planned with the use of a 22 gauge spinal needle. An 18 gauge trocar needle was advanced into the abscess/fluid collection and a short Amplatz super stiff wire was coiled within the collection. Appropriate positioning was confirmed with a limited CT scan. The tract was serially dilated allowing placement of a 10 [place] all-purpose drainage catheter. Appropriate positioning was confirmed with a limited postprocedural CT scan. Approximately 15 ml of purulent fluid was aspirated. The tube was connected to a JP bulb and sutured in place. A dressing was placed. The patient tolerated the procedure well without immediate post procedural complication. IMPRESSION: Successful CT guided placement of a 10 French all purpose drain catheter into the recurrent right paraspinal abscess with aspiration of 15 mL of purulent fluid. Samples were sent to the laboratory as requested by the ordering clinical team. PLAN: - Flush the percutaneous drainage catheter with 10 cc of normal saline b.i.d. - Record daily output from the percutaneous drainage catheter. - once drainage catheter output is less than 10  cc per day (excluding flush), repeat contrast-enhanced CT scan of the abdomen and pelvis is recommended to ensure abscess resolution. Ultimately, the percutaneous drainage catheter may be removed once the abscess has resolved on CT imaging, not requiring dedicated percutaneous drainage catheter injection given lack clinical concern for an enteric fistula. Electronically Signed   By: Sandi Mariscal M.D.   On: 03/15/2019 17:06        Scheduled Meds:  amLODipine  5 mg Oral Daily   Chlorhexidine Gluconate Cloth  6 each  Topical Daily   feeding supplement (ENSURE ENLIVE)  237 mL Oral BID BM   feeding supplement (PRO-STAT SUGAR FREE 64)  30 mL Oral TID   insulin aspart  0-24 Units Subcutaneous TID AC & HS   lidocaine  1 patch Transdermal Q24H   mouth rinse  15 mL Mouth Rinse BID   metoprolol tartrate  50 mg Oral BID   multivitamin with minerals  1 tablet Oral Daily   oxyCODONE  15 mg Oral Q12H   pantoprazole  40 mg Oral Daily   polyethylene glycol  17 g Oral Daily   sodium chloride flush  10-40 mL Intracatheter Q12H   sodium chloride flush  5 mL Intracatheter Q8H   Continuous Infusions:  sodium chloride 250 mL (03/07/19 2139)    ceFAZolin (ANCEF) IV 2 g (03/16/19 0510)   sodium chloride       LOS: 22 days     Georgette Shell, MD [facility]  If 7PM-7AM, please contact night-coverage [email or web address] Password [email or web address] 03/16/2019, 11:40 AM

## 2019-03-16 NOTE — Progress Notes (Signed)
Physical Therapy Treatment Patient Details Name: Franklin Anderson MRN: [id number] DOB: 11-Sep-1970 Today's Date: 03/16/2019    History of Present Illness Pt is 48 y/o M with PMH: IVDA (heroine), who presented with worsening back pain and B LE weakness. 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 Pt was intubated 10/28, extubated 10/31.    PT Comments    Patient agreeable to participate in therapy this date and making gradual progress with mobility. He required cues for sequencing bed mobility and less assistance needed this date to raise trunk and mobilize LE's to sit EOB. Pt required min assist to initiate sit to stand transfers and was able to take several short steps to sit in bedside recliner with encouragement. Pt declined to ambulate greater distance secondary to back pain and RN notified pt requesting medicine at EOS. Acute PT will continue to follow pt and progress as able.   Follow Up Recommendations  SNF     Equipment Recommendations  Other (comment)(TBD)    Recommendations for Other Services       Precautions / Restrictions Precautions Precautions: Fall Precaution Comments: JP drain Lt low back Restrictions Weight Bearing Restrictions: No    Mobility  Bed Mobility Overal bed mobility: Needs Assistance Bed Mobility: Rolling;Supine to Sit Rolling: Min guard Sidelying to sit: Min assist       General bed mobility comments: verbal cues for sequenicng and use of rail at start of roll, assist required to bring Rt LE off EOB and to assist with raising trunk upright  Transfers Overall transfer level: Needs assistance Equipment used: Rolling walker (2 wheeled) Transfers: Sit to/from Stand Sit to Stand: Min assist;+2 safety/equipment;From elevated surface Stand pivot transfers: Min assist;+2 safety/equipment;From elevated surface       General transfer comment: 2 person assist for safety, min assist to  initiate power up from elevated surface and to steady throughout rise, verbal cues required at start to facilitate safe hand placement on RW.  Ambulation/Gait   Gait Distance (Feet): 4 Feet(short steps taken to sit in recliner) Assistive device: Rolling walker (2 wheeled) Gait Pattern/deviations: Antalgic;Step-to pattern;Decreased step length - right;Decreased step length - left;Decreased stride length;Narrow base of support Gait velocity: slow   General Gait Details: after 1 forward step pt stated "thats all I can do" but with encouragement was able to step over to recliner. pt declined to ambulate for greater distance than ~ 4-5 feet to get to bedside recliner; pt with decreased hip/knee flexion for stepping, min assist required to steady throughout turn   Stairs             Wheelchair Mobility    Modified Rankin (Stroke Patients Only)       Balance Overall balance assessment: Needs assistance Sitting-balance support: Bilateral upper extremity supported;Feet supported Sitting balance-Leahy Scale: Fair     Standing balance support: Bilateral upper extremity supported Standing balance-Leahy Scale: Poor Standing balance comment: reliant on external support and PT for static and dynamic standing.               Cognition Arousal/Alertness: Awake/alert Behavior During Therapy: WFL for tasks assessed/performed Overall Cognitive Status: Within Functional Limits for tasks assessed        General Comments: pt more agreeable to therpay today, no agressive language this session, pt complaining of pain throughout      Exercises      General Comments        Pertinent Vitals/Pain Pain Assessment: 0-10  Pain Score: 8  Pain Location: back, with sit to stand Pain Descriptors / Indicators: Discomfort;Guarding;Sharp;Grimacing Pain Intervention(s): Limited activity within patient's tolerance;Monitored during session;Repositioned;Patient requesting pain meds-RN notified            PT Goals (current goals can now be found in the care plan section) Acute Rehab PT Goals Patient Stated Goal: To get stronger and make better decisions PT Goal Formulation: With patient Time For Goal Achievement: 03/20/19 Potential to Achieve Goals: Fair Progress towards PT goals: Progressing toward goals    Frequency    Min 2X/week      PT Plan Current plan remains appropriate       AM-PAC PT "6 Clicks" Mobility   Outcome Measure  Help needed turning from your back to your side while in a flat bed without using bedrails?: A Little Help needed moving from lying on your back to sitting on the side of a flat bed without using bedrails?: A Little Help needed moving to and from a bed to a chair (including a wheelchair)?: A Lot Help needed standing up from a chair using your arms (e.g., wheelchair or bedside chair)?: A Lot Help needed to walk in hospital room?: Total Help needed climbing 3-5 steps with a railing? : Total 6 Click Score: 12    End of Session Equipment Utilized During Treatment: Gait belt Activity Tolerance: Patient limited by pain;Other (comment)(self-limits) Patient left: with call bell/phone within reach;in chair;with chair alarm set(pt up in chair without chair alarm upon PT arrival) Nurse Communication: Mobility status PT Visit Diagnosis: Muscle weakness (generalized) (M62.81);Difficulty in walking, not elsewhere classified (R26.2)     Time: 9983-3825 PT Time Calculation (min) (ACUTE ONLY): 22 min  Charges:  $Therapeutic Activity: 8-22 mins                     Kipp Brood, PT, DPT Physical Therapist with [facility]  03/16/2019 1:41 PM

## 2019-03-16 NOTE — Progress Notes (Signed)
Pt refused PO pain medication stated percocet is not helping him. MD is paged, meds changed. Pt is educated regarding IV dilaudid, verbalizes understanding and states the pain is very bad today comparing to yesterday. Monitoring closely.

## 2019-03-17 LAB — GLUCOSE, CAPILLARY
Glucose-Capillary: 87 mg/dL (ref 70–99)
Glucose-Capillary: 128 mg/dL — ABNORMAL HIGH (ref 70–99)
Glucose-Capillary: 128 mg/dL — ABNORMAL HIGH (ref 70–99)
Glucose-Capillary: 113 mg/dL — ABNORMAL HIGH (ref 70–99)

## 2019-03-17 NOTE — Progress Notes (Signed)
Referring Physician(s): Amin,S  Supervising Physician: Richarda Overlie  Patient Status:  [facility] - In-pt  Chief Complaint:  Back pain/abscess  Subjective: Pt cont to c/o back pain, worse with movement   Allergies: Penicillins  Medications: Prior to Admission medications   Medication Sig Start Date End Date Taking? Authorizing Provider  Multiple Vitamin (MULTIVITAMIN WITH MINERALS) TABS tablet Take 1 tablet by mouth daily.   Yes [provider]  lisinopril (ZESTRIL) 10 MG tablet Take 1 tablet by mouth daily Patient not taking: Reported on 02/21/2019 01/20/19   Julieanne Manson, MD     Vital Signs: BP (!) 132/93 (BP Location: Right Arm)    Pulse 95    Temp 98.1 F (36.7 C) (Oral)    Resp 18    Ht 6' 0.01" (1.829 m)    Wt 110 lb 0.2 oz (49.9 kg)    SpO2 98%    BMI 14.92 kg/m   Physical Exam awake/alert; rt flank drain intact, site mod tender, dressing dry, output about 25 cc yesterday, 15 cc so far today serosang fluid  Imaging: Dg Chest 1 View  Result Date: 03/16/2019 CLINICAL DATA:  Pleural effusion. EXAM: CHEST  1 VIEW COMPARISON:  March 15, 2019. FINDINGS: The heart size and mediastinal contours are within normal limits. Stable mild left apical and basilar pneumothorax is noted. Stable bilateral nodular densities are noted, some of which are cavitary, consistent with septic emboli. The visualized skeletal structures are unremarkable. IMPRESSION: Stable mild left pneumothorax. Stable bilateral nodular densities are again noted consistent with septic emboli. Electronically Signed   By: Lupita Raider M.D.   On: 03/16/2019 13:49   Mr Lumbar Spine W Wo Contrast  Result Date: 03/14/2019 CLINICAL DATA:  Back pain, infection suspected. Patient with disseminated MSSA infection multiple abscesses. Worsening lower back pain not responding to pain management. Follow-up known osteomyelitis in spine. EXAM: MRI LUMBAR SPINE WITHOUT AND WITH CONTRAST TECHNIQUE: Multiplanar  and multiecho pulse sequences of the lumbar spine were obtained without and with intravenous contrast. CONTRAST:  5mL GADAVIST GADOBUTROL 1 MMOL/ML IV SOLN COMPARISON:  Lumbar spine MRI 02/21/2019 FINDINGS: Segmentation: For the purposes of this dictation, five lumbar vertebrae are assumed and the caudal most well-formed intervertebral disc is designated L5-S1. Alignment: Straightening of the expected lumbar lordosis. No significant spondylolisthesis. Lumbar levocurvature. Vertebrae: Vertebral body height is maintained. Endplate irregularity at L4-L5 has increased as compared to prior examination. Again demonstrated there is extensive abnormal T2 hyperintense signal throughout the L4 and L5 vertebrae extending to the disc space with associated abnormal enhancement. Findings consistent with discitis/osteomyelitis. As before, there is fluid within the bilateral L4-L5 facet joints (greater on the right) and septic arthritis is difficult to exclude (series 11, image 29). More conspicuous than on prior examination, there is abnormal T2 hyperintensity and enhancement within the L2 and L3 vertebrae along the disc space. Findings highly suspicious for an additional level of discitis/osteomyelitis. Conus medullaris and cauda equina: Conus extends to the L2 level. No signal abnormality within the visualized distal spinal cord. New from prior MRI, there is ventral epidural phlegmon at the L4-L5 level measuring 0.6 cm in AP dimension (series 10, image 7). Paraspinal and other soft tissues: There is nonspecific soft tissue edema and enhancement within the lumbar paraspinal soft tissues, greatest at L4-L5 level. More conspicuous than on prior examination. There is abnormal paraspinal enhancing soft tissue consistent with phlegmon at the L4-L5 level extending to involve both psoas muscles. This extends into the bilateral L4-L5  neural foramina and partially into the bilateral L5-S1 neural foramina. Again demonstrated are bilateral  psoas abscesses, the largest on the right measuring 2.7 x 2.4 x 4.4 cm (series 6, image 33) (series 9, image 13). Disc levels: T12-L1: No disc herniation. No significant canal or foraminal stenosis. L1-L2: No disc herniation. No significant canal or foraminal stenosis. L2-L3: Disc bulge with endplate spurring. New from prior examination there is a left center/subarticular caudally migrated disc extrusion. Mild facet arthrosis. The disc extrusion contributes to significant left subarticular stenosis with encroachment upon the descending left L3 nerve root. No significant central canal stenosis or neural foraminal narrowing. L3-L4: Disc bulge asymmetric to the left with associated endplate spurring. Mild facet arthrosis. Mild left subarticular and bilateral neural foraminal narrowing. No significant central canal stenosis. L4-L5: Disc bulge with endplate remodeling. Ventral epidural phlegmon as described. No significant spinal canal stenosis. As before, abnormal enhancement likely reflecting phlegmon extends into both neural foramina with severe bilateral neural foraminal narrowing. L5-S1: Small disc bulge. Mild facet arthrosis. No significant spinal canal stenosis. Abnormal soft tissue enhancement likely reflecting phlegmon extends into the bilateral neural foramina. IMPRESSION: 1. Redemonstrated findings of L4-L5 discitis/osteomyelitis. Endplate irregularity at this level has progressed. Persistent fluid within the bilateral L4-L5 facet joints (greater on the right). For which septic arthritis is difficult to exclude. New ventral epidural phlegmon at this level measuring 0.6 cm in AP dimension without significant canal stenosis. Persistent extensive paraspinal phlegmon at this level extending into the bilateral L4-L5 neural foramina with resultant severe neural foraminal narrowing. Phlegmon also extends partially into the bilateral L5-S1 neural foramina. 2. Signal abnormality and abnormal enhancement now present at  the L2-L3 level also suspicious for discitis/osteomyelitis. 3. Redemonstrated bilateral psoas abscesses, the largest on the right measuring 2.7 x 2.4 x 4.4 cm. 4. New L2-L3 left center/subarticular caudally migrated disc extrusion. Resultant left subarticular stenosis with encroachment upon the descending left L3 nerve root. Electronically Signed   By: Jackey LogeKyle  Golden DO   On: 03/14/2019 13:07   Dg Chest Port 1 View  Result Date: 03/15/2019 CLINICAL DATA:  Hydropneumothorax. EXAM: PORTABLE CHEST 1 VIEW COMPARISON:  March 14, 2019. FINDINGS: The heart size and mediastinal contours are within normal limits. Stable mild left apical pneumothorax is noted. Stable bilateral nodular densities are noted some of which are cavitary, consistent with septic emboli. The visualized skeletal structures are unremarkable. IMPRESSION: Stable mild left apical pneumothorax is noted. Stable bilateral nodular densities are noted, some of which are cavitary, consistent with septic emboli. Electronically Signed   By: Lupita RaiderJames  Green Jr M.D.   On: 03/15/2019 13:03   Dg Chest Port 1 View  Result Date: 03/14/2019 CLINICAL DATA:  Chest tube removal EXAM: PORTABLE CHEST 1 VIEW COMPARISON:  03/14/2019, 03/13/2019, 03/10/2019 FINDINGS: Interim removal of left-sided chest tube. Overall no significant interval change in size of small residual left apicolateral pneumothorax. There is probable trace pneumothorax at the left CP angle. Stable cardiomediastinal silhouette. Multiple bilateral nodules and cavitary lesions as before. IMPRESSION: 1. Removal of left chest tube. 2. Overall no significant interval change in size of small residual left apicolateral pneumothorax. Now seen is a small amount of pneumothorax component at the left CP angle. 3. Stable bilateral lung nodules and cavitary lesions. Electronically Signed   By: Jasmine PangKim  Fujinaga M.D.   On: 03/14/2019 16:55   Dg Chest Port 1 View  Result Date: 03/14/2019 CLINICAL DATA:   Hydropneumothorax EXAM: PORTABLE CHEST 1 VIEW COMPARISON:  03/13/2019 FINDINGS: No significant change  in AP portable examination with a persistent small left lateral and apical pneumothorax component and a left-sided pigtail chest tube in position about the lateral lung base. Probable small persistent effusion component. No change in multiple masses and nodules bilaterally, some of which are cavitary. The heart and mediastinum are unremarkable. IMPRESSION: 1. No significant change in AP portable examination with a persistent small left lateral and apical pneumothorax component and a left-sided pigtail chest tube in position about the lateral lung base. Probable small persistent effusion component. 2. No change in multiple masses and nodules bilaterally, some of which are cavitary. Electronically Signed   By: Eddie Candle M.D.   On: 03/14/2019 09:58   Dg Chest Port 1 View  Result Date: 03/13/2019 CLINICAL DATA:  48 year male with shortness of breath and left-sided pneumothorax. History of septic emboli. EXAM: PORTABLE CHEST 1 VIEW COMPARISON:  Chest radiograph dated 03/10/2019. FINDINGS: Left-sided chest tube in similar position. Overall slight interval increase in the size of the left pneumothorax since the radiograph of 03/10/2019. Continued follow-up recommended. No mediastinal shift. Bilateral nodular and confluent airspace densities similar to prior radiograph. There is a small left pleural effusion. Stable cardiac silhouette. No acute osseous pathology. IMPRESSION: 1. Slight interval increase in the size of the left pneumothorax since the radiograph of 03/10/19. Continued follow-up recommended. 2. Bilateral nodular and confluent airspace densities similar to prior radiograph. Electronically Signed   By: Anner Crete M.D.   On: 03/13/2019 12:26   Ct Image Guided Drainage By Percutaneous Catheter  Result Date: 03/15/2019 INDICATION: History of intravenous drug abuse with recurrent  right-sided paraspinal abscess. Patient presents today for CT-guided aspiration and/or drainage catheter placement. No, patient previously underwent CT-guided paraspinal aspiration on 02/22/2019. EXAM: CT IMAGE GUIDED DRAINAGE BY PERCUTANEOUS CATHETER COMPARISON:  Lumbar spine MRI-03/14/2019; CT-guided paraspinal abscess aspiration-02/22/2019 MEDICATIONS: The patient is currently admitted to the hospital and receiving intravenous antibiotics. The antibiotics were administered within an appropriate time frame prior to the initiation of the procedure. ANESTHESIA/SEDATION: Moderate (conscious) sedation was employed during this procedure. A total of Benadryl 25 mg IV; versed 3 mg and Fentanyl 100 mcg was administered intravenously. Moderate Sedation Time: 14 minutes. The patient's level of consciousness and vital signs were monitored continuously by radiology nursing throughout the procedure under my direct supervision. CONTRAST:  None COMPLICATIONS: None immediate. PROCEDURE: Informed written consent was obtained from the patient after a discussion of the risks, benefits and alternatives to treatment. The patient was placed prone on the CT gantry and a pre procedural CT was performed re-demonstrating the known abscess/fluid collection within the inferior aspect the right iliopsoas musculature with dominant component measuring approximately 2.6 x 2.4 cm (25, series 2) the procedure was planned. A timeout was performed prior to the initiation of the procedure. The skin overlying the posterior inferior back was prepped and draped in the usual sterile fashion. The overlying soft tissues were anesthetized with 1% lidocaine with epinephrine. Appropriate trajectory was planned with the use of a 22 gauge spinal needle. An 18 gauge trocar needle was advanced into the abscess/fluid collection and a short Amplatz super stiff wire was coiled within the collection. Appropriate positioning was confirmed with a limited CT scan. The  tract was serially dilated allowing placement of a 10 [place] all-purpose drainage catheter. Appropriate positioning was confirmed with a limited postprocedural CT scan. Approximately 15 ml of purulent fluid was aspirated. The tube was connected to a JP bulb and sutured in place. A dressing was placed. The patient  tolerated the procedure well without immediate post procedural complication. IMPRESSION: Successful CT guided placement of a 10 French all purpose drain catheter into the recurrent right paraspinal abscess with aspiration of 15 mL of purulent fluid. Samples were sent to the laboratory as requested by the ordering clinical team. PLAN: - Flush the percutaneous drainage catheter with 10 cc of normal saline b.i.d. - Record daily output from the percutaneous drainage catheter. - once drainage catheter output is less than 10 cc per day (excluding flush), repeat contrast-enhanced CT scan of the abdomen and pelvis is recommended to ensure abscess resolution. Ultimately, the percutaneous drainage catheter may be removed once the abscess has resolved on CT imaging, not requiring dedicated percutaneous drainage catheter injection given lack clinical concern for an enteric fistula. Electronically Signed   By: Simonne Come M.D.   On: 03/15/2019 17:06    Labs:  CBC: Recent Labs    03/10/19 1029 03/11/19 1124 03/13/19 0936 03/15/19 0545  WBC 7.4 8.4 7.7 6.8  HGB 8.3* 8.5* 8.0* 8.8*  HCT 26.3* 26.9* 26.1* 28.8*  PLT 363 419* 434* 548*    COAGS: Recent Labs    02/21/19 2013 02/22/19 1850 02/28/19 2331 03/15/19 0545  INR 1.4* 1.7* 1.4* 1.2  APTT 24 28 30   --     BMP: Recent Labs    03/08/19 0610 03/11/19 1124 03/13/19 0936 03/15/19 0545  NA 134* 134* 133* 133*  K 4.2 4.8 4.1 4.3  CL 97* 97* 96* 97*  CO2 28 29 30 27   GLUCOSE 111* 108* 122* 99  BUN 11 14 14 12   CALCIUM 8.2* 8.0* 8.2* 8.4*  CREATININE 0.39* 0.38* 0.38* 0.36*  GFRNONAA >60 >60 >60 >60  GFRAA >60 >60 >60 >60    LIVER  FUNCTION TESTS: Recent Labs    02/24/19 0415 02/25/19 0440 03/08/19 0610 03/15/19 0545  BILITOT 0.8 0.6 0.4 0.2*  AST 26 23 73* 25  ALT 39 28 42 21  ALKPHOS 88 81 92 99  PROT 6.2* 6.4* 6.5 6.8  ALBUMIN 1.5* 1.4* 1.7* 2.0*    Assessment and Plan: Patient with history of intravenous drug abuse and recurrent right-sided paraspinal abscess; status post drain placement on 03/15/2019; drain fluid cultures negative to date; patient afebrile.  No new labs.  Cont drain; once output less than 10 to 15 cc/day excluding flush amount obtain f/u CT   Electronically Signed: D. 13/11/20, PA-C 03/17/2019, 11:47 AM   I spent a total of 15 minutes at the the patient's bedside AND on the patient's hospital floor or unit, greater than 50% of which was counseling/coordinating care for right paraspinal abscess drain    Patient ID: 03/17/2019, male   DOB: 1970-09-05, 48 y.o.   MRN: [id number]

## 2019-03-17 NOTE — TOC Progression Note (Signed)
Transition of Care [facility]) - Progression Note    Patient Details  Name: Franklin Anderson MRN: [id number] Date of Birth: [birth year]  Transition of Care [facility] CM/SW [place], [place] Phone Number: 03/17/2019, 9:11 AM  Clinical Narrative:   Left message for Ebony Hail at [facility] inquiring about status of referral.  She returned my call and explained that she has put together a list of potential residents who have substance abuse history who are needing this level of care.  [facility] has agreed to provide LOG for those without a payor source.  She is not sure when the new unit dedicated to this particular population is slated to open, but she sees from the notes here that [name] is compliant with treatment and appears motivated for change.  She is advocating for him to be admitted before the new unit is open.  She will let me know next week where things stand.  TOC will continue to follow during the course of hospitalization.       Expected Discharge Plan: Home/Self Care Barriers to Discharge: No Barriers Identified  Expected Discharge Plan and Services Expected Discharge Plan: Home/Self Care In-house Referral: Clinical Social Work     Living arrangements for the past 2 months: Single Family Home                                       Social Determinants of Health (SDOH) Interventions    Readmission Risk Interventions No flowsheet data found.

## 2019-03-17 NOTE — Progress Notes (Signed)
PROGRESS NOTE    Franklin Anderson  WNU:[id number] DOB: 01/17/1971 DOA: 02/21/2019 PCP: Julieanne MansonMulberry, Damarie Schoolfield, MD    Brief Narrative: 48 y.o.malewith medical history significant of rheumatic fever, HTN, ongoingIV heroin abuse, presented to the ED on 10/27 with 3 week course of progressively worsening back pain and BLE weakness associated with some bowel incontinence and trouble walking. Last use of heroin was on the day of admission. He was found to be septic with MSSA bacteremia, endocarditis, osteomyelitis, discitis at L4 and L5, septic pulmonary emboli with loculated effusion and pneumothorax and psoas abscesses. On 10/28 was found to be obtunded and dyspneic and was intubated by critical care, extubated on 10/31. Right psoas abscess was aspirated on 10/28, wound care >MSSA. Respiratory culture from 10/28 also grew MSSA.  Patient had acute respiratory distress on 02/28/2019 was intubated.  He had chest tube placement for loculated pneumothorax/CT of the chest indicating empyema pulmonary embolism hydropneumothorax.  Assessment & Plan:   Principal Problem:   Sepsis (HCC) Active Problems:   Dyspnea   Heroin use   Endocarditis   Septic pulmonary embolism (HCC)   Osteomyelitis of thoracic spine (HCC)   Osteomyelitis of lumbar spine (HCC)   Acute encephalopathy   AKI (acute kidney injury) (HCC)   Transaminitis   Psoas abscess (HCC)   Pressure injury of skin   Mucus clot in bronchi   Protein-calorie malnutrition, severe   Abnormal echocardiogram   MSSA bacteremia   Acute pulmonary embolism (HCC)   Pleural effusion on left   Empyema, left (HCC)   Chest tube in place   Pain   Pneumothorax on left   Fistula   Epidural abscess   #1 MSSA bacteremia with discitis and vertebral osteomyelitis and psoas abscess with septic pulmonary embolism/cavitary lesion and right ventricular vegetation- CT with persistent loculated left hydropneumothorax with decreased pneumothorax. Will  septic emboli throughout both lungs remain unchanged. CT surgery thinks he is not a good candidate for thoracotomy and muscle flap closure. Followed by ID recommending 6 weeks of Ancef in the hospital and then discharged home on Keflex for another 30 days. Ancef was started 02/24/2019. He is status post chest tube placement for bronchopleural fistula. Paraspinal abscess fluid culture 03/15/2019 abend WBC predominantly polymorphonuclear leukocytes no organisms or no growth. PT OT out of bed ambulate  #2 back pain secondary to bilateral psoas abscess with progression of discitis and osteomyelitis with nerve compression and worsening of stenosis. Patient had CT-guided right psoas aspiration by IR 02/22/2019 yielding 18 mL of purulent fluid with MSSA. Patient has recurrent psoas abscess.  He is status post CT-guided placement of drainage catheter to the recurrent right paraspinal abscess yielded 15 cc of purulent material 03/16/2019.  Drain in place draining serosanguineous.  #3 acute hypoxic respiratory failure due to multiple pulmonary embolism and sepsis patient intubated 02/22/2019 and extubated 02/25/2019.  Patient has loculated effusion with pneumothorax and bronchopleural fistula he is status post chest tube placement which is out now.  Chest x-ray to be followed up today.  #4 pericardial effusion noted on the echo none on repeat CT scan.  #5 acute metabolic encephalopathy resolved.  #6 AKI resolved.  #7 hypernatremia resolved.  #8 hypertension blood pressure 111/86 yesterday improved to 126/87 by decreasing the dose of Norvasc to 5 mg daily.  Continue metoprolol.Monitor daily.   #9 pressure injury with stage I continue wound care  #10 severe protein calorie malnutrition severe albuminemia dietary consulted and is following. Recommending Ensure Enlive p.o. twice daily, prostat  liquid protein 3 times daily with meals, Magic cup 3 times daily with meals and multivitamins with  minerals   #11 pain control continue oxycodone and as needed Dilaudid.  Pressure Injury 02/22/19 Sacrum Mid Stage I -  Intact skin with non-blanchable redness of a localized area usually over a bony prominence. (Active)  02/22/19 0420  Location: Sacrum  Location Orientation: Mid  Staging: Stage I -  Intact skin with non-blanchable redness of a localized area usually over a bony prominence.  Wound Description (Comments):   Present on Admission: Yes     Pressure Injury 03/13/19 Hip Right Deep Tissue Injury - Purple or maroon localized area of discolored intact skin or blood-filled blister due to damage of underlying soft tissue from pressure and/or shear. purple area, skin intact (Active)  03/13/19 0800  Location: Hip  Location Orientation: Right  Staging: Deep Tissue Injury - Purple or maroon localized area of discolored intact skin or blood-filled blister due to damage of underlying soft tissue from pressure and/or shear.  Wound Description (Comments): purple area, skin intact  Present on Admission: No   DVT prophylaxisHeparin  code Status:Full code  family Communication:None Disposition Plan:Pending clinical improvement and completion of IV Ancef   consultants:  PCCM, cardiology, ID, palliative care, cardiothoracic surgery  Procedures:CT-guided drainage of right psoas abscess 02/22/2019 Antimicrobials:Ancef started 02/24/2019   Nutrition Problem: Severe Malnutrition Etiology: social / environmental circumstances(IVDU)     Signs/Symptoms: severe fat depletion, severe muscle depletion    Interventions: Ensure Enlive (each supplement provides 350kcal and 20 grams of protein), MVI  Estimated body mass index is 14.92 kg/m as calculated from the following:   Height as of this encounter: 6' 0.01" (1.829 m).   Weight as of this encounter: 49.9 kg.    Subjective: Patient resting in bed last evening he was refusing to take Percocet as it was not helping him, he  was requesting more Dilaudid.  Objective: Vitals:   03/16/19 0503 03/16/19 1403 03/16/19 2036 03/17/19 0526  BP: 126/87 121/90 133/86 (!) 132/93  Pulse: 99 (!) 104 (!) 108 95  Resp: Temp: 98.5 F (36.9 C) 98 F (36.7 C) 98.9 F (37.2 C) 98.1 F (36.7 C)  TempSrc: Oral Oral Oral Oral  SpO2: 98% 100% 98% 98%  Weight:      Height:        Intake/Output Summary (Last 24 hours) at 03/17/2019 1245 Last data filed at 03/17/2019 1000 Gross per 24 hour  Intake 1485 ml  Output 1750 ml  Net -265 ml   Filed Weights   03/14/19 0500 03/15/19 0522 03/15/19 2000  Weight: 49.8 kg 49.9 kg 49.9 kg    Examination:  General exam: Appears calm and comfortable  Respiratory system: Clear to auscultation. Respiratory effort normal. Cardiovascular system: S1 & S2 heard, RRR. No JVD, murmurs, rubs, gallops or clicks. No pedal edema. Gastrointestinal system: Abdomen is nondistended, soft and nontender. No organomegaly or masses felt. Normal bowel sounds heard.  Drain in place right draining serosanguineous. Central nervous system: Alert and oriented. No focal neurological deficits. Extremities: Symmetric 5 x 5 power. Skin: No rashes, lesions or ulcers Psychiatry: Judgement and insight appear normal. Mood & affect appropriate.     Data Reviewed: I have personally reviewed following labs and imaging studies  CBC: Recent Labs  Lab 03/11/19 1124 03/13/19 0936 03/15/19 0545  WBC 8.4 7.7 6.8  NEUTROABS  --   --  3.8  HGB 8.5* 8.0* 8.8*  HCT 26.9*  26.1* 28.8*  MCV 93.4 94.2 93.8  PLT 419* 434* 548*   Basic Metabolic Panel: Recent Labs  Lab 03/11/19 1124 03/13/19 0936 03/15/19 0545  NA 134* 133* 133*  K 4.8 4.1 4.3  CL 97* 96* 97*  CO2 29 30 27   GLUCOSE 108* 122* 99  BUN 14 14 12   CREATININE 0.38* 0.38* 0.36*  CALCIUM 8.0* 8.2* 8.4*   GFR: Estimated Creatinine Clearance: 78.8 mL/min (A) (by C-G formula based on SCr of 0.36 mg/dL (L)). Liver Function Tests: Recent  Labs  Lab 03/15/19 0545  AST 25  ALT 21  ALKPHOS 99  BILITOT 0.2*  PROT 6.8  ALBUMIN 2.0*   No results for input(s): LIPASE, AMYLASE in the last 168 hours. No results for input(s): AMMONIA in the last 168 hours. Coagulation Profile: Recent Labs  Lab 03/15/19 0545  INR 1.2   Cardiac Enzymes: No results for input(s): CKTOTAL, CKMB, CKMBINDEX, TROPONINI in the last 168 hours. BNP (last 3 results) No results for input(s): PROBNP in the last 8760 hours. HbA1C: No results for input(s): HGBA1C in the last 72 hours. CBG: Recent Labs  Lab 03/16/19 1238 03/16/19 1635 03/16/19 2034 03/17/19 0751 03/17/19 1215  GLUCAP 134* 167* 138* 87 128*   Lipid Profile: No results for input(s): CHOL, HDL, LDLCALC, TRIG, CHOLHDL, LDLDIRECT in the last 72 hours. Thyroid Function Tests: No results for input(s): TSH, T4TOTAL, FREET4, T3FREE, THYROIDAB in the last 72 hours. Anemia Panel: No results for input(s): VITAMINB12, FOLATE, FERRITIN, TIBC, IRON, RETICCTPCT in the last 72 hours. Sepsis Labs: No results for input(s): PROCALCITON, LATICACIDVEN in the last 168 hours.  Recent Results (from the past 240 hour(s))  Aerobic/Anaerobic Culture (surgical/deep wound)     Status: None (Preliminary result)   Collection Time: 03/15/19  4:38 PM   Specimen: Abscess  Result Value Ref Range Status   Specimen Description   Final    ABSCESS PARASPINAL Performed at [facility], [address], [place], [address] [place]    Special Requests   Final    Normal Performed at [facility], [address], [place], [place]    Gram Stain   Final    ABUNDANT WBC PRESENT, PREDOMINANTLY PMN NO ORGANISMS SEEN    Culture   Final    NO GROWTH < 24 HOURS Performed at [facility], [address] [place], [address] [place]    Report Status PENDING  Incomplete         Radiology Studies: Dg Chest 1 View  Result Date: 03/16/2019 CLINICAL DATA:  Pleural effusion.  EXAM: CHEST  1 VIEW COMPARISON:  March 15, 2019. FINDINGS: The heart size and mediastinal contours are within normal limits. Stable mild left apical and basilar pneumothorax is noted. Stable bilateral nodular densities are noted, some of which are cavitary, consistent with septic emboli. The visualized skeletal structures are unremarkable. IMPRESSION: Stable mild left pneumothorax. Stable bilateral nodular densities are again noted consistent with septic emboli. Electronically Signed   By: 03/18/2019 M.D.   On: 03/16/2019 13:49   Dg Chest Port 1 View  Result Date: 03/15/2019 CLINICAL DATA:  Hydropneumothorax. EXAM: PORTABLE CHEST 1 VIEW COMPARISON:  March 14, 2019. FINDINGS: The heart size and mediastinal contours are within normal limits. Stable mild left apical pneumothorax is noted. Stable bilateral nodular densities are noted some of which are cavitary, consistent with septic emboli. The visualized skeletal structures are unremarkable. IMPRESSION: Stable mild left apical pneumothorax is noted.  Stable bilateral nodular densities are noted, some of which are cavitary, consistent with septic emboli. Electronically Signed   By: Marijo Conception M.D.   On: 03/15/2019 13:03   Ct Image Guided Drainage By Percutaneous Catheter  Result Date: 03/15/2019 INDICATION: History of intravenous drug abuse with recurrent right-sided paraspinal abscess. Patient presents today for CT-guided aspiration and/or drainage catheter placement. No, patient previously underwent CT-guided paraspinal aspiration on 02/22/2019. EXAM: CT IMAGE GUIDED DRAINAGE BY PERCUTANEOUS CATHETER COMPARISON:  Lumbar spine MRI-03/14/2019; CT-guided paraspinal abscess aspiration-02/22/2019 MEDICATIONS: The patient is currently admitted to the hospital and receiving intravenous antibiotics. The antibiotics were administered within an appropriate time frame prior to the initiation of the procedure. ANESTHESIA/SEDATION: Moderate (conscious)  sedation was employed during this procedure. A total of Benadryl 25 mg IV; versed 3 mg and Fentanyl 100 mcg was administered intravenously. Moderate Sedation Time: 14 minutes. The patient's level of consciousness and vital signs were monitored continuously by radiology nursing throughout the procedure under my direct supervision. CONTRAST:  None COMPLICATIONS: None immediate. PROCEDURE: Informed written consent was obtained from the patient after a discussion of the risks, benefits and alternatives to treatment. The patient was placed prone on the CT gantry and a pre procedural CT was performed re-demonstrating the known abscess/fluid collection within the inferior aspect the right iliopsoas musculature with dominant component measuring approximately 2.6 x 2.4 cm (25, series 2) the procedure was planned. A timeout was performed prior to the initiation of the procedure. The skin overlying the posterior inferior back was prepped and draped in the usual sterile fashion. The overlying soft tissues were anesthetized with 1% lidocaine with epinephrine. Appropriate trajectory was planned with the use of a 22 gauge spinal needle. An 18 gauge trocar needle was advanced into the abscess/fluid collection and a short Amplatz super stiff wire was coiled within the collection. Appropriate positioning was confirmed with a limited CT scan. The tract was serially dilated allowing placement of a 10 [place] all-purpose drainage catheter. Appropriate positioning was confirmed with a limited postprocedural CT scan. Approximately 15 ml of purulent fluid was aspirated. The tube was connected to a JP bulb and sutured in place. A dressing was placed. The patient tolerated the procedure well without immediate post procedural complication. IMPRESSION: Successful CT guided placement of a 10 French all purpose drain catheter into the recurrent right paraspinal abscess with aspiration of 15 mL of purulent fluid. Samples were sent to the  laboratory as requested by the ordering clinical team. PLAN: - Flush the percutaneous drainage catheter with 10 cc of normal saline b.i.d. - Record daily output from the percutaneous drainage catheter. - once drainage catheter output is less than 10 cc per day (excluding flush), repeat contrast-enhanced CT scan of the abdomen and pelvis is recommended to ensure abscess resolution. Ultimately, the percutaneous drainage catheter may be removed once the abscess has resolved on CT imaging, not requiring dedicated percutaneous drainage catheter injection given lack clinical concern for an enteric fistula. Electronically Signed   By: Sandi Mariscal M.D.   On: 03/15/2019 17:06        Scheduled Meds:  amLODipine  5 mg Oral Daily   Chlorhexidine Gluconate Cloth  6 each Topical Daily   feeding supplement (ENSURE ENLIVE)  237 mL Oral BID BM   feeding supplement (PRO-STAT SUGAR FREE 64)  30 mL Oral TID   insulin aspart  0-24 Units Subcutaneous TID AC & HS   lidocaine  1 patch Transdermal Q24H   mouth rinse  15  mL Mouth Rinse BID   metoprolol tartrate  50 mg Oral BID   multivitamin with minerals  1 tablet Oral Daily   oxyCODONE  15 mg Oral Q12H   pantoprazole  40 mg Oral Daily   polyethylene glycol  17 g Oral Daily   sodium chloride flush  10-40 mL Intracatheter Q12H   sodium chloride flush  5 mL Intracatheter Q8H   Continuous Infusions:  sodium chloride 250 mL (03/07/19 2139)    ceFAZolin (ANCEF) IV 2 g (03/17/19 0521)   sodium chloride       LOS: 23 days      Alwyn Ren, MD [facility]  If 7PM-7AM, please contact night-coverage [email or web address] Password [email or web address] 03/17/2019, 12:45 PM

## 2019-03-18 LAB — GLUCOSE, CAPILLARY
Glucose-Capillary: 103 mg/dL — ABNORMAL HIGH (ref 70–99)
Glucose-Capillary: 145 mg/dL — ABNORMAL HIGH (ref 70–99)
Glucose-Capillary: 104 mg/dL — ABNORMAL HIGH (ref 70–99)
Glucose-Capillary: 100 mg/dL — ABNORMAL HIGH (ref 70–99)

## 2019-03-18 NOTE — Progress Notes (Signed)
PROGRESS NOTE    Franklin Anderson  WUX:[id number] DOB: 1970/09/10 DOA: 02/21/2019 PCP: Mack Hook, MD    Brief Narrative:  48 y.o.malewith medical history significant of rheumatic fever, HTN, ongoingIV heroin abuse, presented to the ED on 10/27 with 3 week course of progressively worsening back pain and BLE weakness associated with some bowel incontinence and trouble walking. Last use of heroin was on the day of admission. He was found to be septic with MSSA bacteremia, endocarditis, osteomyelitis, discitis at L4 and L5, septic pulmonary emboli with loculated effusion and pneumothorax and psoas abscesses. On 10/28 was found to be obtunded and dyspneic and was intubated by critical care, extubated on 10/31. Right psoas abscess was aspirated on 10/28, wound care >MSSA. Respiratory culture from 10/28 also grew MSSA.  Patient had acute respiratory distress on 02/28/2019 was intubated.  He had chest tube placement for loculated pneumothorax/CT of the chest indicating empyema pulmonary embolism hydropneumothorax.   Assessment & Plan:   Principal Problem:   Sepsis ([place]) Active Problems:   Dyspnea   Heroin use   Endocarditis   Septic pulmonary embolism (HCC)   Osteomyelitis of thoracic spine (HCC)   Osteomyelitis of lumbar spine (HCC)   Acute encephalopathy   AKI (acute kidney injury) ([place])   Transaminitis   Psoas abscess (HCC)   Pressure injury of skin   Mucus clot in bronchi   Protein-calorie malnutrition, severe   Abnormal echocardiogram   MSSA bacteremia   Acute pulmonary embolism (HCC)   Pleural effusion on left   Empyema, left (HCC)   Chest tube in place   Pain   Pneumothorax on left   Fistula   Epidural abscess   #1 MSSA bacteremia with discitis and vertebral osteomyelitis and psoas abscess with septic pulmonary embolism/cavitary lesion andright ventricularvegetation- CT with persistent loculated left hydropneumothorax with decreased pneumothorax. Will  septic emboli throughout both lungs remain unchanged. CT surgery thinks he is not a good candidate for thoracotomy and muscle flap closure. Followed by ID recommending 6 weeks of Ancef in the hospital and then discharged home on Keflex for another 30 days. Ancef was started 02/24/2019. He is status post chest tube placement for bronchopleural fistula. Paraspinal abscess fluid culture 03/15/2019 abend WBC predominantly polymorphonuclear leukocytes no organisms or no growth. He reports he got out of bed and sat in the chair for few hours.  However he is asking for every 2 hours IV narcotics.   Drain in place with serosanguineous drainage much less than yesterday.   #2 back pain secondary to bilateral psoas abscess with progression of discitis and osteomyelitis with nerve compression and worsening of stenosis. Patient had CT-guided right psoas aspiration by IR 02/22/2019 yielding 18 mL of purulent fluid with MSSA. Patient has recurrent psoas abscess.He is status post CT-guided placement of drainage catheter to the recurrent right paraspinal abscess yielded 15 cc of purulent material 03/16/2019.  Drain in place draining serosanguineous.  #3 acute hypoxic respiratory failure due to multiple pulmonary embolism and sepsis patient intubated 02/22/2019 and extubated 02/25/2019.Patient has loculated effusion with pneumothorax and bronchopleural fistula he is status post chest tube placement which is out now. Chest x-ray reviewed by me 03/17/2019 shows stable mild left pneumothorax stable bilateral nodular densities consistent with septic emboli. #4 pericardial effusion noted on the echo none on repeat CT scan.  #5 acute metabolic encephalopathy resolved.  #6 AKI resolved.  #7 hypernatremia resolved.  #8 hypertension blood pressure 129/91 continue Norvasc 5 mg daily continue metoprolol.  Follow-up in  a.m.  I will not change any doses today since it was just changed yesterday.  #9 pressure  injury with stage I continue wound care  #10 severe protein calorie malnutrition severe albuminemia dietary consulted and is following. Recommending Ensure Enlive p.o. twice daily, prostat liquid protein 3 times daily with meals, Magic cup 3 times daily with meals and multivitamins with minerals   #11 pain control continue oxycodone and as needed Dilaudid.  Pressure Injury 02/22/19 Sacrum Mid Stage I -  Intact skin with non-blanchable redness of a localized area usually over a bony prominence. (Active)  02/22/19 0420  Location: Sacrum  Location Orientation: Mid  Staging: Stage I -  Intact skin with non-blanchable redness of a localized area usually over a bony prominence.  Wound Description (Comments):   Present on Admission: Yes     Pressure Injury 03/13/19 Hip Right Deep Tissue Injury - Purple or maroon localized area of discolored intact skin or blood-filled blister due to damage of underlying soft tissue from pressure and/or shear. purple area, skin intact (Active)  03/13/19 0800  Location: Hip  Location Orientation: Right  Staging: Deep Tissue Injury - Purple or maroon localized area of discolored intact skin or blood-filled blister due to damage of underlying soft tissue from pressure and/or shear.  Wound Description (Comments): purple area, skin intact  Present on Admission: No      Nutrition Problem: Severe Malnutrition Etiology: social / environmental circumstances(IVDU)     Signs/Symptoms: severe fat depletion, severe muscle depletion    Interventions: Ensure Enlive (each supplement provides 350kcal and 20 grams of protein), MVI  Estimated body mass index is 14.92 kg/m as calculated from the following:   Height as of this encounter: 6' 0.01" (1.829 m).   Weight as of this encounter: 49.9 kg.   DVT prophylaxisHeparin  code Status:Full code  family Communication:None Disposition Plan:Pending clinical improvement and completion of IV Ancef    consultants:  PCCM, cardiology, ID, palliative care, cardiothoracic surgery  Procedures:CT-guided drainage of right psoas abscess 02/22/2019 Antimicrobials:Ancef started 02/24/2019  Subjective:  Resting in bed feels better waiting to have physical therapy get him out of bed so he can sit up and move around better no nausea vomiting diarrhea reported no events overnight reported Objective: Vitals:   03/17/19 1611 03/17/19 2133 03/18/19 0609 03/18/19 1344  BP: (!) 141/84 (!) 123/91 127/84 (!) 129/91  Pulse: (!) 102 (!) 114 97 (!) 112  Resp: 20 20 20 16   Temp: 98.4 F (36.9 C) 98.8 F (37.1 C) 98.3 F (36.8 C) 98.8 F (37.1 C)  TempSrc:  Oral Oral Oral  SpO2: 100% 96% 97% 98%  Weight:      Height:        Intake/Output Summary (Last 24 hours) at 03/18/2019 1406 Last data filed at 03/18/2019 0924 Gross per 24 hour  Intake 250 ml  Output 2300 ml  Net -2050 ml   Filed Weights   03/14/19 0500 03/15/19 0522 03/15/19 2000  Weight: 49.8 kg 49.9 kg 49.9 kg    Examination:  General exam: Appears calm and comfortable  Respiratory system: Clear to auscultation. Respiratory effort normal. Cardiovascular system: S1 & S2 heard, RRR. No JVD, murmurs, rubs, gallops or clicks. No pedal edema. Gastrointestinal system: Abdomen is nondistended, soft and nontender. No organomegaly or masses felt. Normal bowel sounds heard.  Drain right psoas in place with serosanguineous drainage Central nervous system: Alert and oriented. No focal neurological deficits. Extremities: Symmetric 5 x 5 power. Skin: No rashes, lesions  or ulcers Psychiatry: Judgement and insight appear normal. Mood & affect appropriate.     Data Reviewed: I have personally reviewed following labs and imaging studies  CBC: Recent Labs  Lab 03/13/19 0936 03/15/19 0545  WBC 7.7 6.8  NEUTROABS  --  3.8  HGB 8.0* 8.8*  HCT 26.1* 28.8*  MCV 94.2 93.8  PLT 434* 548*   Basic Metabolic Panel: Recent Labs  Lab  03/13/19 0936 03/15/19 0545  NA 133* 133*  K 4.1 4.3  CL 96* 97*  CO2 30 27  GLUCOSE 122* 99  BUN 14 12  CREATININE 0.38* 0.36*  CALCIUM 8.2* 8.4*   GFR: Estimated Creatinine Clearance: 78.8 mL/min (A) (by C-G formula based on SCr of 0.36 mg/dL (L)). Liver Function Tests: Recent Labs  Lab 03/15/19 0545  AST 25  ALT 21  ALKPHOS 99  BILITOT 0.2*  PROT 6.8  ALBUMIN 2.0*   No results for input(s): LIPASE, AMYLASE in the last 168 hours. No results for input(s): AMMONIA in the last 168 hours. Coagulation Profile: Recent Labs  Lab 03/15/19 0545  INR 1.2   Cardiac Enzymes: No results for input(s): CKTOTAL, CKMB, CKMBINDEX, TROPONINI in the last 168 hours. BNP (last 3 results) No results for input(s): PROBNP in the last 8760 hours. HbA1C: No results for input(s): HGBA1C in the last 72 hours. CBG: Recent Labs  Lab 03/17/19 1215 03/17/19 1651 03/17/19 2135 03/18/19 0713 03/18/19 1118  GLUCAP 128* 128* 113* 103* 145*   Lipid Profile: No results for input(s): CHOL, HDL, LDLCALC, TRIG, CHOLHDL, LDLDIRECT in the last 72 hours. Thyroid Function Tests: No results for input(s): TSH, T4TOTAL, FREET4, T3FREE, THYROIDAB in the last 72 hours. Anemia Panel: No results for input(s): VITAMINB12, FOLATE, FERRITIN, TIBC, IRON, RETICCTPCT in the last 72 hours. Sepsis Labs: No results for input(s): PROCALCITON, LATICACIDVEN in the last 168 hours.  Recent Results (from the past 240 hour(s))  Aerobic/Anaerobic Culture (surgical/deep wound)     Status: None (Preliminary result)   Collection Time: 03/15/19  4:38 PM   Specimen: Abscess  Result Value Ref Range Status   Specimen Description   Final    ABSCESS PARASPINAL Performed at [facility], [address], [place], [place] [address]    Special Requests   Final    Normal Performed at [facility], [address], [place], [place] [address]    Gram Stain   Final    ABUNDANT WBC PRESENT, PREDOMINANTLY  PMN NO ORGANISMS SEEN    Culture   Final    NO GROWTH 3 DAYS NO ANAEROBES ISOLATED; CULTURE IN PROGRESS FOR 5 DAYS Performed at [facility], [address] [place], [place] [address]    Report Status PENDING  Incomplete         Radiology Studies: No results found.      Scheduled Meds: . amLODipine  5 mg Oral Daily  . Chlorhexidine Gluconate Cloth  6 each Topical Daily  . feeding supplement (ENSURE ENLIVE)  237 mL Oral BID BM  . feeding supplement (PRO-STAT SUGAR FREE 64)  30 mL Oral TID  . insulin aspart  0-24 Units Subcutaneous TID AC & HS  . lidocaine  1 patch Transdermal Q24H  . mouth rinse  15 mL Mouth Rinse BID  . metoprolol tartrate  50 mg Oral BID  . multivitamin with minerals  1 tablet Oral Daily  . oxyCODONE  15 mg Oral Q12H  . pantoprazole  40 mg Oral Daily  .  polyethylene glycol  17 g Oral Daily  . sodium chloride flush  10-40 mL Intracatheter Q12H  . sodium chloride flush  5 mL Intracatheter Q8H   Continuous Infusions: . sodium chloride 250 mL (03/07/19 2139)  .  ceFAZolin (ANCEF) IV 2 g (03/18/19 0457)  . sodium chloride       LOS: 24 days    Alwyn RenElizabeth G Matson Welch, MD [facility]  If 7PM-7AM, please contact night-coverage [email or web address] Password [email or web address] 03/18/2019, 2:06 PM

## 2019-03-18 NOTE — Progress Notes (Signed)
Pt remains tachy at times. MD aware.

## 2019-03-19 LAB — CBC
HCT: 27.7 % — ABNORMAL LOW (ref 39.0–52.0)
Hemoglobin: 8.7 g/dL — ABNORMAL LOW (ref 13.0–17.0)
MCH: 29.2 pg (ref 26.0–34.0)
MCHC: 31.4 g/dL (ref 30.0–36.0)
MCV: 93 fL (ref 80.0–100.0)
Platelets: 607 10*3/uL — ABNORMAL HIGH (ref 150–400)
RBC: 2.98 MIL/uL — ABNORMAL LOW (ref 4.22–5.81)
RDW: 17.1 % — ABNORMAL HIGH (ref 11.5–15.5)
WBC: 7.9 10*3/uL (ref 4.0–10.5)
nRBC: 0 % (ref 0.0–0.2)

## 2019-03-19 LAB — COMPREHENSIVE METABOLIC PANEL
ALT: 17 U/L (ref 0–44)
AST: 22 U/L (ref 15–41)
Albumin: 2.1 g/dL — ABNORMAL LOW (ref 3.5–5.0)
Alkaline Phosphatase: 91 U/L (ref 38–126)
Anion gap: 9 (ref 5–15)
BUN: 14 mg/dL (ref 6–20)
CO2: 28 mmol/L (ref 22–32)
Calcium: 8.4 mg/dL — ABNORMAL LOW (ref 8.9–10.3)
Chloride: 97 mmol/L — ABNORMAL LOW (ref 98–111)
Creatinine, Ser: 0.48 mg/dL — ABNORMAL LOW (ref 0.61–1.24)
GFR calc Af Amer: >60 mL/min (ref >60)
GFR calc non Af Amer: >60 mL/min (ref >60)
Glucose, Bld: 106 mg/dL — ABNORMAL HIGH (ref 70–99)
Potassium: 3.8 mmol/L (ref 3.5–5.1)
Sodium: 134 mmol/L — ABNORMAL LOW (ref 135–145)
Total Bilirubin: 0.7 mg/dL (ref 0.3–1.2)
Total Protein: 6.8 g/dL (ref 6.5–8.1)

## 2019-03-19 LAB — GLUCOSE, CAPILLARY
Glucose-Capillary: 110 mg/dL — ABNORMAL HIGH (ref 70–99)
Glucose-Capillary: 244 mg/dL — ABNORMAL HIGH (ref 70–99)
Glucose-Capillary: 107 mg/dL — ABNORMAL HIGH (ref 70–99)
Glucose-Capillary: 141 mg/dL — ABNORMAL HIGH (ref 70–99)

## 2019-03-19 MED ORDER — HYDROMORPHONE HCL 1 MG/ML IJ SOLN
1.5000 mg | INTRAMUSCULAR | Status: DC | PRN
  Administered 2019-03-19 – 2019-03-20 (×6): 1.5 mg via INTRAVENOUS
  Filled 2019-03-19 (×6): qty 1.5

## 2019-03-19 NOTE — Progress Notes (Signed)
PROGRESS NOTE    Franklin Anderson  ZOX:[id number] DOB: 08/21/1970 DOA: 02/21/2019 PCP: Julieanne MansonMulberry, Tniyah Nakagawa, MD  Brief Narrative: 48 y.o.malewith medical history significant of rheumatic fever, HTN, ongoingIV heroin abuse, presented to the ED on 10/27 with 3 week course of progressively worsening back pain and BLE weakness associated with some bowel incontinence and trouble walking. Last use of heroin was on the day of admission. He was found to be septic with MSSA bacteremia, endocarditis, osteomyelitis, discitis at L4 and L5, septic pulmonary emboli with loculated effusion and pneumothorax and psoas abscesses. On 10/28 was found to be obtunded and dyspneic and was intubated by critical care, extubated on 10/31. Right psoas abscess was aspirated on 10/28, wound care >MSSA. Respiratory culture from 10/28 also grew MSSA.  Patient had acute respiratory distress on 02/28/2019 was intubated. He had chest tube placement for loculated pneumothorax/CT of the chest indicating empyema pulmonary embolism hydropneumothorax.  Assessment & Plan:   Principal Problem:   Sepsis (HCC) Active Problems:   Dyspnea   Heroin use   Endocarditis   Septic pulmonary embolism (HCC)   Osteomyelitis of thoracic spine (HCC)   Osteomyelitis of lumbar spine (HCC)   Acute encephalopathy   AKI (acute kidney injury) (HCC)   Transaminitis   Psoas abscess (HCC)   Pressure injury of skin   Mucus clot in bronchi   Protein-calorie malnutrition, severe   Abnormal echocardiogram   MSSA bacteremia   Acute pulmonary embolism (HCC)   Pleural effusion on left   Empyema, left (HCC)   Chest tube in place   Pain   Pneumothorax on left   Fistula   Epidural abscess   #1 MSSA bacteremia with discitis and vertebral osteomyelitis and psoas abscess with septic pulmonary embolism/cavitary lesion andright ventricularvegetation- CT with persistent loculated left hydropneumothorax with decreased pneumothorax. Will septic  emboli throughout both lungs remain unchanged. CT surgery thinks he is not a good candidate for thoracotomy and muscle flap closure. Followed by ID recommending 6 weeks of Ancef in the hospital and then discharged home on Keflex for another 30 days. Ancef was started 02/24/2019. He is status post chest tube placement for bronchopleural fistula. Paraspinal abscess fluid culture 03/15/2019 abend WBC predominantly polymorphonuclear leukocytes no organisms or no growth. He reports he got out of bed and sat in the chair for few hours.  However he is asking for every 2 hours IV narcotics.  change to q3 prn Drain in place with serosanguineous drainage much less than yesterday.   #2 back pain secondary to bilateral psoas abscess with progression of discitis and osteomyelitis with nerve compression and worsening of stenosis. Patient had CT-guided right psoas aspiration by IR 02/22/2019 yielding 18 mL of purulent fluid with MSSA. Patient has recurrent psoas abscess.He is status post CT-guided placement of drainage catheter to the recurrent right paraspinal abscess yielded 15 cc of purulent material 03/16/2019.Drain in place draining serosanguineous.  #3 acute hypoxic respiratory failure due to multiple pulmonary embolism and sepsis patient intubated 02/22/2019 and extubated 02/25/2019.Patient has loculated effusion with pneumothorax and bronchopleural fistula he is status post chest tube placement which is out now. Chest x-ray reviewed by me 03/17/2019 shows stable mild left pneumothorax stable bilateral nodular densities consistent with septic emboli. #4 pericardial effusion noted on the echo none on repeat CT scan.  #5 acute metabolic encephalopathy resolved.  #6 AKI resolved.  #7 hypernatremia resolved.  #8 hypertension blood pressure 129/91 continue Norvasc 5 mg daily continue metoprolol.  Follow-up in a.m.  I will  not change any doses today since it was just changed yesterday.  #9  pressure injury with stage I continue wound care  #10 severe protein calorie malnutrition severe albuminemia dietary consulted and is following. Recommending Ensure Enlive p.o. twice daily, prostat liquid protein 3 times daily with meals, Magic cup 3 times daily with meals and multivitamins with minerals   #11 pain control continue oxycodone and as needed Dilaudid.  Pressure Injury 02/22/19 Sacrum Mid Stage I -  Intact skin with non-blanchable redness of a localized area usually over a bony prominence. (Active)  02/22/19 0420  Location: Sacrum  Location Orientation: Mid  Staging: Stage I -  Intact skin with non-blanchable redness of a localized area usually over a bony prominence.  Wound Description (Comments):   Present on Admission: Yes     Pressure Injury 03/13/19 Hip Right Deep Tissue Injury - Purple or maroon localized area of discolored intact skin or blood-filled blister due to damage of underlying soft tissue from pressure and/or shear. purple area, skin intact (Active)  03/13/19 0800  Location: Hip  Location Orientation: Right  Staging: Deep Tissue Injury - Purple or maroon localized area of discolored intact skin or blood-filled blister due to damage of underlying soft tissue from pressure and/or shear.  Wound Description (Comments): purple area, skin intact  Present on Admission: No      Nutrition Problem: Severe Malnutrition Etiology: social / environmental circumstances(IVDU)     Signs/Symptoms: severe fat depletion, severe muscle depletion    Interventions: Ensure Enlive (each supplement provides 350kcal and 20 grams of protein), MVI  Estimated body mass index is 14.92 kg/m as calculated from the following:   Height as of this encounter: 6' 0.01" (1.829 m).   Weight as of this encounter: 49.9 kg.  DVT prophylaxisHeparin  code Status:Full code  family Communication:None Disposition Plan:Pending clinical improvement and completion of IV Ancef    consultants:  PCCM, cardiology, ID, palliative care, cardiothoracic surgery  Procedures:CT-guided drainage of right psoas abscess 02/22/2019 Antimicrobials:Ancef started 02/24/2019   Subjective:  No new complaints lying in bed and eating Objective: Vitals:   03/18/19 1344 03/18/19 2147 03/19/19 0524 03/19/19 1357  BP: (!) 129/91 118/80 (!) 129/91 127/84  Pulse: (!) 112 (!) 104 94 81  Resp: Temp: 98.8 F (37.1 C) 98.3 F (36.8 C) 98.6 F (37 C) 98.4 F (36.9 C)  TempSrc: Oral Oral Oral Oral  SpO2: 98% 97% 97% 98%  Weight:      Height:        Intake/Output Summary (Last 24 hours) at 03/19/2019 1439 Last data filed at 03/19/2019 1359 Gross per 24 hour  Intake 2135 ml  Output 2365 ml  Net -230 ml   Filed Weights   03/14/19 0500 03/15/19 0522 03/15/19 2000  Weight: 49.8 kg 49.9 kg 49.9 kg    Examination:  General exam: Appears calm and comfortable  Respiratory system: Clear to auscultation. Respiratory effort normal. Cardiovascular system: S1 & S2 heard, RRR. No JVD, murmurs, rubs, gallops or clicks. No pedal edema. Gastrointestinal system: Abdomen is nondistended, soft and nontender. No organomegaly or masses felt. Normal bowel sounds heard.drain with serosanguanous  Central nervous system: Alert and oriented. No focal neurological deficits. Extremities: Symmetric 5 x 5 power. Skin: No rashes, lesions or ulcers Psychiatry: Judgement and insight appear normal. Mood & affect appropriate.     Data Reviewed: I have personally reviewed following labs and imaging studies  CBC: Recent Labs  Lab 03/13/19 0936 03/15/19 0545  03/19/19 1103  WBC 7.7 6.8 7.9  NEUTROABS  --  3.8  --   HGB 8.0* 8.8* 8.7*  HCT 26.1* 28.8* 27.7*  MCV 94.2 93.8 93.0  PLT 434* 548* 563*   Basic Metabolic Panel: Recent Labs  Lab 03/13/19 0936 03/15/19 0545 03/19/19 1103  NA 133* 133* 134*  K 4.1 4.3 3.8  CL 96* 97* 97*  CO2 30 27 28   GLUCOSE 122* 99 106*  BUN  14 12 14   CREATININE 0.38* 0.36* 0.48*  CALCIUM 8.2* 8.4* 8.4*   GFR: Estimated Creatinine Clearance: 78.8 mL/min (A) (by C-G formula based on SCr of 0.48 mg/dL (L)). Liver Function Tests: Recent Labs  Lab 03/15/19 0545 03/19/19 1103  AST 25 22  ALT 21 17  ALKPHOS 99 91  BILITOT 0.2* 0.7  PROT 6.8 6.8  ALBUMIN 2.0* 2.1*   No results for input(s): LIPASE, AMYLASE in the last 168 hours. No results for input(s): AMMONIA in the last 168 hours. Coagulation Profile: Recent Labs  Lab 03/15/19 0545  INR 1.2   Cardiac Enzymes: No results for input(s): CKTOTAL, CKMB, CKMBINDEX, TROPONINI in the last 168 hours. BNP (last 3 results) No results for input(s): PROBNP in the last 8760 hours. HbA1C: No results for input(s): HGBA1C in the last 72 hours. CBG: Recent Labs  Lab 03/18/19 1118 03/18/19 1709 03/18/19 2144 03/19/19 0739 03/19/19 1150  GLUCAP 145* 104* 100* 110* 244*   Lipid Profile: No results for input(s): CHOL, HDL, LDLCALC, TRIG, CHOLHDL, LDLDIRECT in the last 72 hours. Thyroid Function Tests: No results for input(s): TSH, T4TOTAL, FREET4, T3FREE, THYROIDAB in the last 72 hours. Anemia Panel: No results for input(s): VITAMINB12, FOLATE, FERRITIN, TIBC, IRON, RETICCTPCT in the last 72 hours. Sepsis Labs: No results for input(s): PROCALCITON, LATICACIDVEN in the last 168 hours.  Recent Results (from the past 240 hour(s))  Aerobic/Anaerobic Culture (surgical/deep wound)     Status: None (Preliminary result)   Collection Time: 03/15/19  4:38 PM   Specimen: Abscess  Result Value Ref Range Status   Specimen Description   Final    ABSCESS PARASPINAL Performed at [facility], [address], [place], [place] [address]    Special Requests   Final    Normal Performed at [facility] [address], [place], [place] [address]    Gram Stain   Final    ABUNDANT WBC PRESENT, PREDOMINANTLY PMN NO ORGANISMS SEEN    Culture   Final    NO  GROWTH 4 DAYS NO ANAEROBES ISOLATED; CULTURE IN PROGRESS FOR 5 DAYS Performed at [facility] [address] [place], [place] [address]    Report Status PENDING  Incomplete         Radiology Studies: No results found.      Scheduled Meds: . amLODipine  5 mg Oral Daily  . Chlorhexidine Gluconate Cloth  6 each Topical Daily  . feeding supplement (ENSURE ENLIVE)  237 mL Oral BID BM  . feeding supplement (PRO-STAT SUGAR FREE 64)  30 mL Oral TID  . insulin aspart  0-24 Units Subcutaneous TID AC & HS  . lidocaine  1 patch Transdermal Q24H  . mouth rinse  15 mL Mouth Rinse BID  . metoprolol tartrate  50 mg Oral BID  . multivitamin with minerals  1 tablet Oral Daily  . oxyCODONE  15 mg Oral Q12H  . pantoprazole  40 mg Oral Daily  . polyethylene glycol  17 g Oral Daily  .  sodium chloride flush  10-40 mL Intracatheter Q12H  . sodium chloride flush  5 mL Intracatheter Q8H   Continuous Infusions: . sodium chloride 250 mL (03/07/19 2139)  .  ceFAZolin (ANCEF) IV 2 g (03/19/19 1424)  . sodium chloride       LOS: 25 days     Alwyn Ren, MD [facility]  If 7PM-7AM, please contact night-coverage [email or web address] Password [facility] 03/19/2019, 2:39 PM

## 2019-03-20 ENCOUNTER — Inpatient Hospital Stay (HOSPITAL_COMMUNITY): Payer: Self-pay

## 2019-03-20 LAB — GLUCOSE, CAPILLARY
Glucose-Capillary: 87 mg/dL (ref 70–99)
Glucose-Capillary: 127 mg/dL — ABNORMAL HIGH (ref 70–99)
Glucose-Capillary: 140 mg/dL — ABNORMAL HIGH (ref 70–99)

## 2019-03-20 LAB — AEROBIC/ANAEROBIC CULTURE W GRAM STAIN (SURGICAL/DEEP WOUND)
Culture: NO GROWTH
Special Requests: NORMAL

## 2019-03-20 IMAGING — CT CT L SPINE W/ CM
3 series · 12 of 33 positions shown, 14 images · IV contrast (omnipaque)
Comparison: MRI of the lumbar spine [DATE]. CT guided drain
placement [DATE]

CLINICAL DATA: Abdominal pain and fever. Abscess. At the drug
abuse. Progressively worse back pain and bilateral lower extremity
weakness.

EXAM:
CT LUMBAR SPINE WITH CONTRAST
TECHNIQUE: Multidetector CT imaging of the lumbar spine was performed with
intravenous contrast administration.
CONTRAST:  100mL OMNIPAQUE IOHEXOL 300 MG/ML  SOLN

[Series 4: l spine soft · axial · 0.29mm/px · z∈[-347,-177]mm · 4 of 123 slices shown, 5 images]
[im 19/123  soft-tissue]
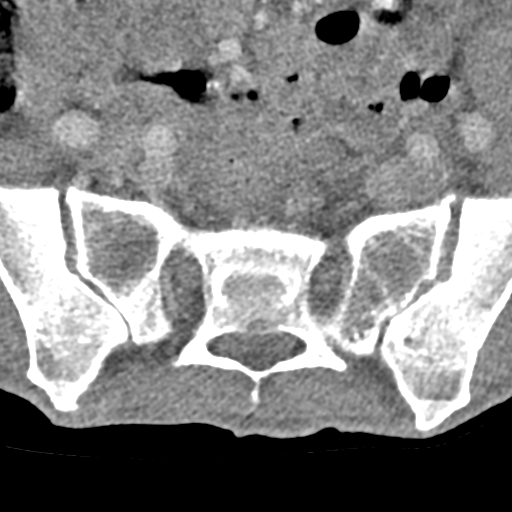
[im 19/123  bone]
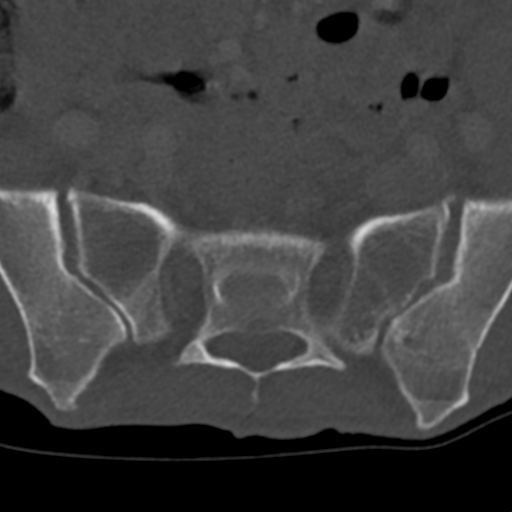
[im 47/123  bone]
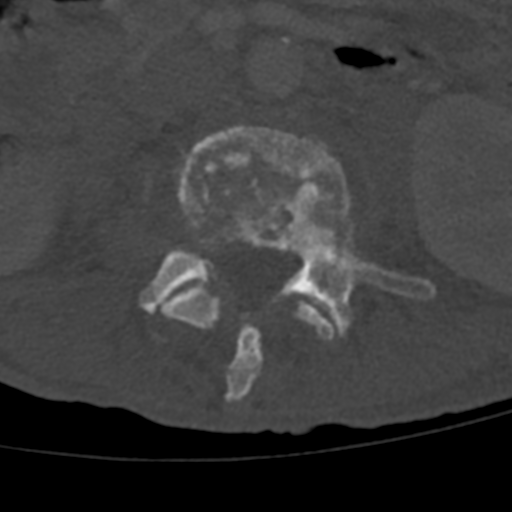
[im 76/123  bone]
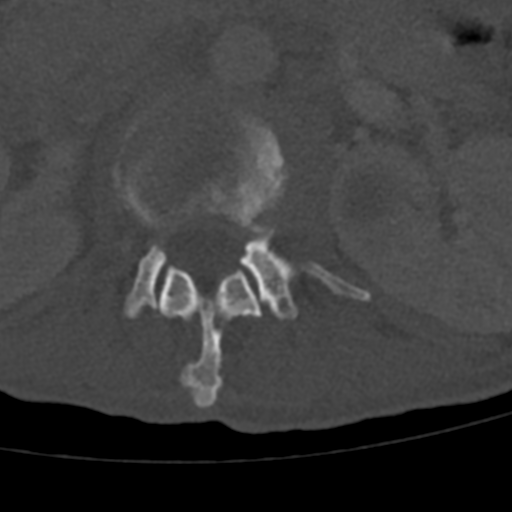
[im 104/123  bone]
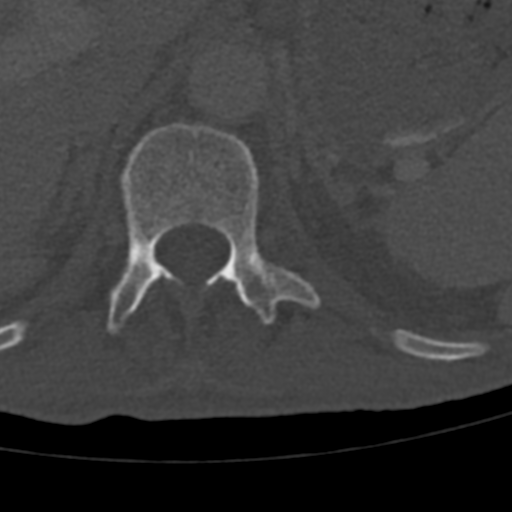

[Series 8: sagittal st · sagittal · 0.38mm/px · 5 of 72 slices shown, 6 images]
[im 24/72  bone]
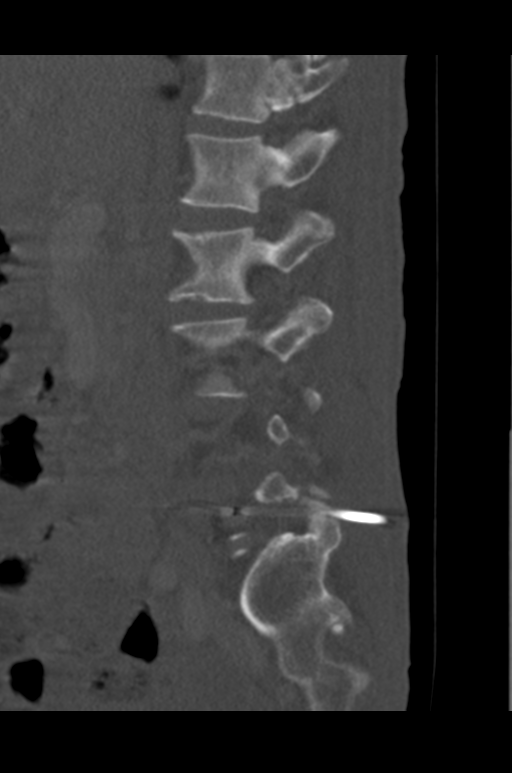
[im 30/72  bone]
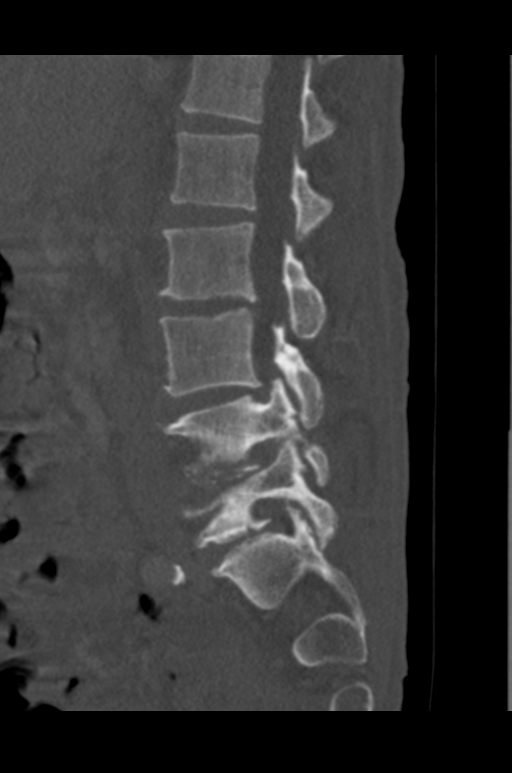
[im 36/72  soft-tissue]
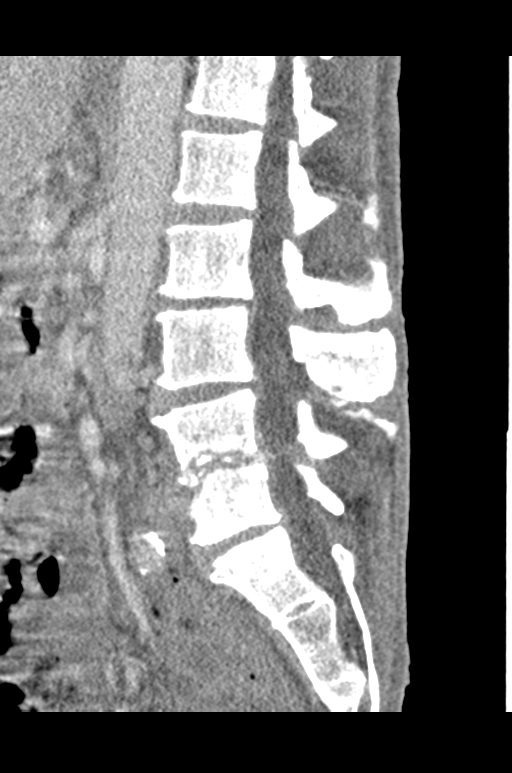
[im 36/72  bone]
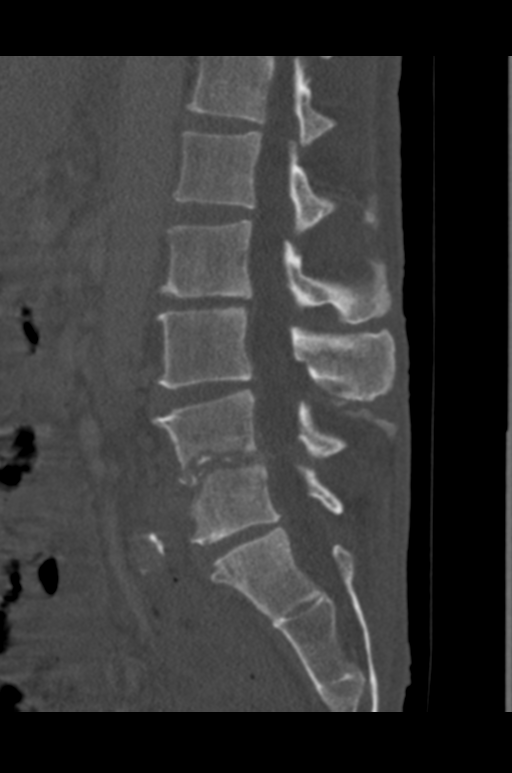
[im 42/72  bone]
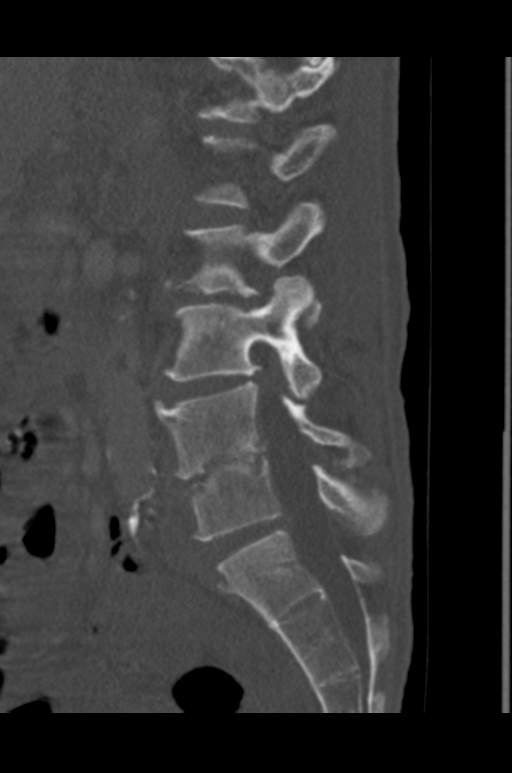
[im 48/72  bone]
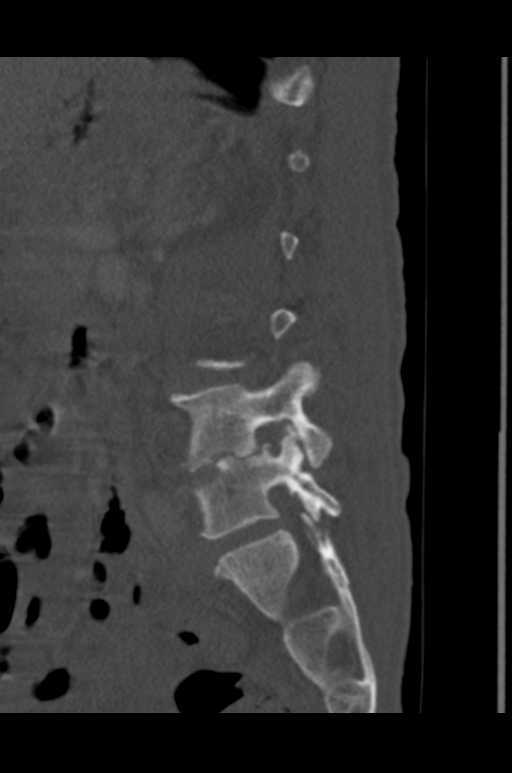

[Series 9: coronal st · coronal · 0.36mm/px · 3 of 59 slices shown]
[im 12/59  bone]
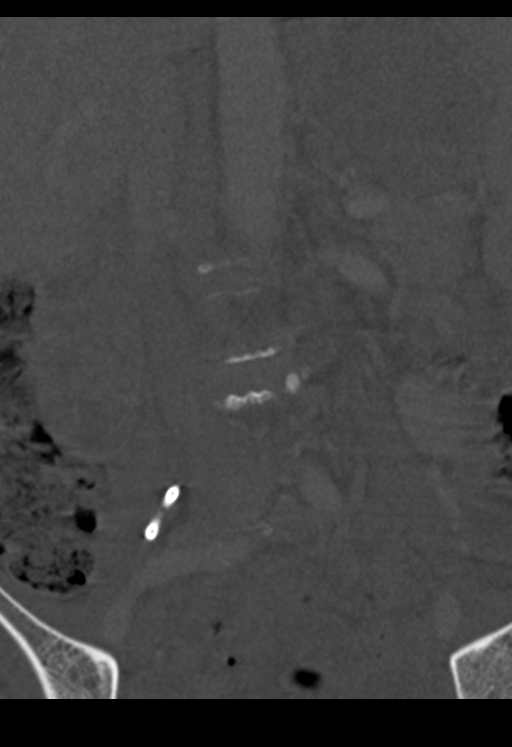
[im 24/59  bone]
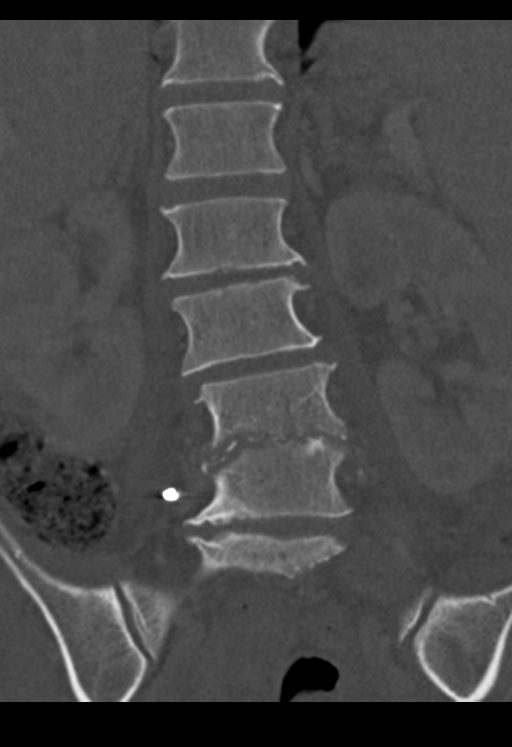
[im 35/59  bone]
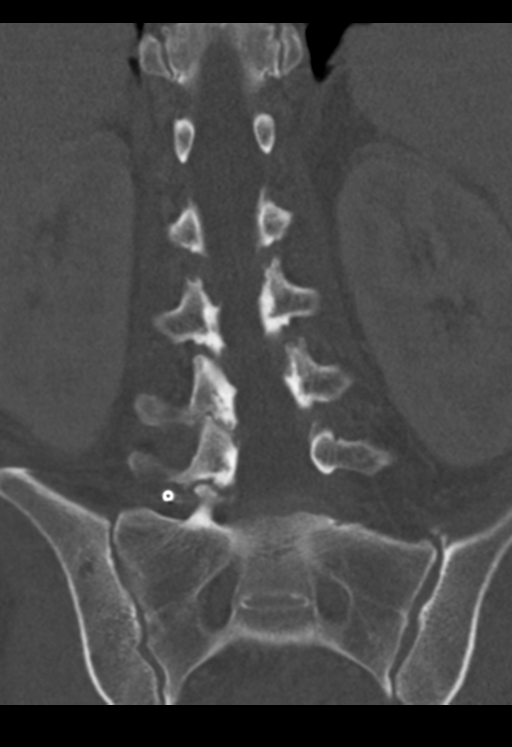

[12 of 33 positions shown; findings below may reference images not displayed]

FINDINGS: Segmentation: 5 non rib-bearing lumbar type vertebral bodies are
present. The lowest fully formed vertebral body is L5.

Alignment: No significant listhesis is present. There is
straightening of the normal lumbar lordosis. Rightward curvature is
present at the thoracolumbar junction. Compensatory leftward
curvature is present at L5-S1.

Vertebrae: Lytic changes are noted at the endplates of L4-5 with
some collapse on right. There is no progression collapse. Vertebral
body heights are otherwise maintained. No other focal lytic or
blastic lesions are present.

Paraspinal and other soft tissues: Right paraspinal drain is in
place. The largest collection collapsed. A more posterior and
slightly superior peripherally enhancing collection measures 12 mm.
No significant adenopathy is present. Atherosclerotic changes are
noted in the aorta without aneurysm.

Disc levels: L1-2: Negative.

L2-3: No significant erosive changes are present. Leftward disc
protrusion is noted.

L3-4: A leftward disc protrusion present. Mild facet hypertrophy is
noted bilaterally.

L4-5: A broad-based disc protrusion is associated with the infected
disc. Moderate foraminal stenosis is again seen bilaterally.

L5-S1: Mild disc bulging is present. Facet hypertrophy is worse on
the right.
IMPRESSION: 1. The right paraspinous scratched at the larger of the 2 right
paraspinous fluid collections is drained.
2. The smaller, more superior posterior collection remains,
measuring up to 12 mm.
3. Stable lytic endplate changes associated with disc osteomyelitis
at L4-5. Collapse is worse on the right, stable from prior exam.
4. Subtle endplate changes posteriorly at L2-3 and at the right
facet of L5-S1 without progression.
5.  Aortic Atherosclerosis ([68]-[68]).

## 2019-03-20 MED ORDER — IOHEXOL 300 MG/ML  SOLN
100.0000 mL | Freq: Once | INTRAMUSCULAR | Status: AC | PRN
  Administered 2019-03-20: 100 mL via INTRAVENOUS

## 2019-03-20 MED ORDER — HYDROMORPHONE HCL 2 MG PO TABS
1.0000 mg | ORAL_TABLET | ORAL | Status: DC | PRN
  Administered 2019-03-20 – 2019-03-21 (×6): 1 mg via ORAL
  Filled 2019-03-20 (×6): qty 1

## 2019-03-20 MED ORDER — SODIUM CHLORIDE (PF) 0.9 % IJ SOLN
INTRAMUSCULAR | Status: AC
  Administered 2019-03-20: 5 mL
  Filled 2019-03-20: qty 50

## 2019-03-20 NOTE — Progress Notes (Signed)
PROGRESS NOTE    Franklin Anderson  GNO:[id number] DOB: Nov 10, 1970 DOA: 02/21/2019 PCP: Julieanne Manson, MD   Brief Narrative:48 y.o.malewith medical history significant of rheumatic fever, HTN, ongoingIV heroin abuse, presented to the ED on 10/27 with 3 week course of progressively worsening back pain and BLE weakness associated with some bowel incontinence and trouble walking. Last use of heroin was on the day of admission. He was found to be septic with MSSA bacteremia, endocarditis, osteomyelitis, discitis at L4 and L5, septic pulmonary emboli with loculated effusion and pneumothorax and psoas abscesses. On 10/28 was found to be obtunded and dyspneic and was intubated by critical care, extubated on 10/31. Right psoas abscess was aspirated on 10/28, wound care >MSSA. Respiratory culture from 10/28 also grew MSSA.  Patient had acute respiratory distress on 02/28/2019 was intubated. He had chest tube placement for loculated pneumothorax/CT of the chest indicating empyema pulmonary embolism hydropneumothorax  Assessment & Plan:   Principal Problem:   Sepsis (HCC) Active Problems:   Dyspnea   Heroin use   Endocarditis   Septic pulmonary embolism (HCC)   Osteomyelitis of thoracic spine (HCC)   Osteomyelitis of lumbar spine (HCC)   Acute encephalopathy   AKI (acute kidney injury) (HCC)   Transaminitis   Psoas abscess (HCC)   Pressure injury of skin   Mucus clot in bronchi   Protein-calorie malnutrition, severe   Abnormal echocardiogram   MSSA bacteremia   Acute pulmonary embolism (HCC)   Pleural effusion on left   Empyema, left (HCC)   Chest tube in place   Pain   Pneumothorax on left   Fistula   Epidural abscess   #1 MSSA bacteremia with discitis and vertebral osteomyelitis and psoas abscess with septic pulmonary embolism/cavitary lesion andright ventricularvegetation- CT with persistent loculated left hydropneumothorax with decreased pneumothorax. Will septic  emboli throughout both lungs remain unchanged. CT surgery thinks he is not a good candidate for thoracotomy and muscle flap closure. Followed by ID recommending 6 weeks of Ancef in the hospital and then discharged home on Keflex for another 30 days. Ancef was started 02/24/2019. He is status post chest tube placement for bronchopleural fistula. Paraspinal abscess fluid culture 03/15/2019 abend WBC predominantly polymorphonuclear leukocytes no organisms or no growth. I have changed to Dilaudid to 1 mg every 4 as needed.  He will need SNF placement upon discharge.   #2 back pain secondary to bilateral psoas abscess with progression of discitis and osteomyelitis with nerve compression and worsening of stenosis. Patient had CT-guided right psoas aspiration by IR 02/22/2019 yielding 18 mL of purulent fluid with MSSA. Patient has recurrent psoas abscess.He is status post CT-guided placement of drainage catheter to the recurrent right paraspinal abscess yielded 15 cc of purulent material 03/16/2019.Drain in place draining serosanguineous. I do not see any drain output recorded.  He has very minimal drainage in his's drainage bulb today.  Will check CT of the lumbar spine to follow-up on the paraspinal abscess.  #3 acute hypoxic respiratory failure due to multiple pulmonary embolism and sepsis patient intubated 02/22/2019 and extubated 02/25/2019.Patient has loculated effusion with pneumothorax and bronchopleural fistula he is status post chest tube placement which is out now. Chest x-rayreviewed by me 03/17/2019 shows stable mild left pneumothorax stable bilateral nodular densities consistent with septic emboli.  #4 pericardial effusion noted on the echo none on repeat CT scan.  #5 acute metabolic encephalopathy resolved.   #6 AKI resolved.  #7 hypernatremia resolved.  #8 hypertension blood pressure124/89 new Norvasc  5 mg daily with metoprolol.    #9 pressure injury with stage I  continue wound care  #10 severe protein calorie malnutrition severe albuminemia dietary consulted and is following. Recommending Ensure Enlive p.o. twice daily, prostat liquid protein 3 times daily with meals, Magic cup 3 times daily with meals and multivitamins with minerals   #11 pain control continue oxycodone and as needed Dilaudid. Pressure Injury 02/22/19 Sacrum Mid Stage I -  Intact skin with non-blanchable redness of a localized area usually over a bony prominence. (Active)  02/22/19 0420  Location: Sacrum  Location Orientation: Mid  Staging: Stage I -  Intact skin with non-blanchable redness of a localized area usually over a bony prominence.  Wound Description (Comments):   Present on Admission: Yes     Pressure Injury 03/13/19 Hip Right Deep Tissue Injury - Purple or maroon localized area of discolored intact skin or blood-filled blister due to damage of underlying soft tissue from pressure and/or shear. purple area, skin intact (Active)  03/13/19 0800  Location: Hip  Location Orientation: Right  Staging: Deep Tissue Injury - Purple or maroon localized area of discolored intact skin or blood-filled blister due to damage of underlying soft tissue from pressure and/or shear.  Wound Description (Comments): purple area, skin intact  Present on Admission: No      Nutrition Problem: Severe Malnutrition Etiology: social / environmental circumstances(IVDU)     Signs/Symptoms: severe fat depletion, severe muscle depletion    Interventions: Ensure Enlive (each supplement provides 350kcal and 20 grams of protein), MVI  Estimated body mass index is 14.92 kg/m as calculated from the following:   Height as of this encounter: 6' 0.01" (1.829 m).   Weight as of this encounter: 49.9 kg.  DVT prophylaxisHeparin  code Status:Full code  family Communication:None Disposition Plan:Pending clinical improvement and completion of IV Ancef   consultants:  PCCM, cardiology,  ID, palliative care, cardiothoracic surgery  Procedures:CT-guided drainage of right psoas abscess 02/22/2019 Antimicrobials:Ancef started 02/24/2019   Subjective:  Staff reports he wants Dilaudid or he is taking Dilaudid every 2 hours even though he looks comfortable.  Was decreased to every 3 yesterday.  Patient requests that for me to keep him on Dilaudid he is okay if I change him from IV to p.o. but he does not want morphine as it will not help him.  He complains of excruciating right-sided paraspinal pain he had does have a drain in place. Objective: Vitals:   03/19/19 0524 03/19/19 1357 03/19/19 2028 03/20/19 0528  BP: (!) 129/91 127/84 130/87 124/89  Pulse: 94 81 (!) 108 90  Resp: 16 16 16 17   Temp: 98.6 F (37 C) 98.4 F (36.9 C) 99.2 F (37.3 C) 97.6 F (36.4 C)  TempSrc: Oral Oral  Oral  SpO2: 97% 98% 97% 98%  Weight:      Height:        Intake/Output Summary (Last 24 hours) at 03/20/2019 1101 Last data filed at 03/20/2019 0852 Gross per 24 hour  Intake 1544 ml  Output 1675 ml  Net -131 ml   Filed Weights   03/14/19 0500 03/15/19 0522 03/15/19 2000  Weight: 49.8 kg 49.9 kg 49.9 kg    Examination:  General exam: Appears calm and comfortable  Respiratory system: Clear to auscultation. Respiratory effort normal. Cardiovascular system: S1 & S2 heard, RRR. No JVD, murmurs, rubs, gallops or clicks. No pedal edema. Gastrointestinal system: Abdomen is nondistended, soft and nontender. No organomegaly or masses felt. Normal bowel sounds heard.  Drain in place right paraspinal serosanguineous drainage noted  Central nervous system: Alert and oriented. No focal neurological deficits. Extremities: Symmetric 5 x 5 power. Skin: No rashes, lesions or ulcers Psychiatry: Judgement and insight appear normal. Mood & affect appropriate.     Data Reviewed: I have personally reviewed following labs and imaging studies  CBC: Recent Labs  Lab 03/15/19 0545 03/19/19 1103   WBC 6.8 7.9  NEUTROABS 3.8  --   HGB 8.8* 8.7*  HCT 28.8* 27.7*  MCV 93.8 93.0  PLT 548* 607*   Basic Metabolic Panel: Recent Labs  Lab 03/15/19 0545 03/19/19 1103  NA 133* 134*  K 4.3 3.8  CL 97* 97*  CO2 27 28  GLUCOSE 99 106*  BUN 12 14  CREATININE 0.36* 0.48*  CALCIUM 8.4* 8.4*   GFR: Estimated Creatinine Clearance: 78.8 mL/min (A) (by C-G formula based on SCr of 0.48 mg/dL (L)). Liver Function Tests: Recent Labs  Lab 03/15/19 0545 03/19/19 1103  AST 25 22  ALT 21 17  ALKPHOS 99 91  BILITOT 0.2* 0.7  PROT 6.8 6.8  ALBUMIN 2.0* 2.1*   No results for input(s): LIPASE, AMYLASE in the last 168 hours. No results for input(s): AMMONIA in the last 168 hours. Coagulation Profile: Recent Labs  Lab 03/15/19 0545  INR 1.2   Cardiac Enzymes: No results for input(s): CKTOTAL, CKMB, CKMBINDEX, TROPONINI in the last 168 hours. BNP (last 3 results) No results for input(s): PROBNP in the last 8760 hours. HbA1C: No results for input(s): HGBA1C in the last 72 hours. CBG: Recent Labs  Lab 03/19/19 0739 03/19/19 1150 03/19/19 1612 03/19/19 2030 03/20/19 0750  GLUCAP 110* 244* 107* 141* 87   Lipid Profile: No results for input(s): CHOL, HDL, LDLCALC, TRIG, CHOLHDL, LDLDIRECT in the last 72 hours. Thyroid Function Tests: No results for input(s): TSH, T4TOTAL, FREET4, T3FREE, THYROIDAB in the last 72 hours. Anemia Panel: No results for input(s): VITAMINB12, FOLATE, FERRITIN, TIBC, IRON, RETICCTPCT in the last 72 hours. Sepsis Labs: No results for input(s): PROCALCITON, LATICACIDVEN in the last 168 hours.  Recent Results (from the past 240 hour(s))  Aerobic/Anaerobic Culture (surgical/deep wound)     Status: None (Preliminary result)   Collection Time: 03/15/19  4:38 PM   Specimen: Abscess  Result Value Ref Range Status   Specimen Description   Final    ABSCESS PARASPINAL Performed at [facility], [address] [place], [place] [address]    Special  Requests   Final    Normal Performed at [facility], [address] [place], [place] [address]    Gram Stain   Final    ABUNDANT WBC PRESENT, PREDOMINANTLY PMN NO ORGANISMS SEEN    Culture   Final    NO GROWTH 4 DAYS NO ANAEROBES ISOLATED; CULTURE IN PROGRESS FOR 5 DAYS Performed at [facility], [address] [place], [place] [address]    Report Status PENDING  Incomplete         Radiology Studies: No results found.      Scheduled Meds: . amLODipine  5 mg Oral Daily  . Chlorhexidine Gluconate Cloth  6 each Topical Daily  . feeding supplement (ENSURE ENLIVE)  237 mL Oral BID BM  . feeding supplement (PRO-STAT SUGAR FREE 64)  30 mL Oral TID  . insulin aspart  0-24 Units Subcutaneous TID AC & HS  . lidocaine  1 patch Transdermal Q24H  . mouth rinse  15 mL Mouth Rinse  BID  . metoprolol tartrate  50 mg Oral BID  . multivitamin with minerals  1 tablet Oral Daily  . oxyCODONE  15 mg Oral Q12H  . pantoprazole  40 mg Oral Daily  . polyethylene glycol  17 g Oral Daily  . sodium chloride flush  10-40 mL Intracatheter Q12H  . sodium chloride flush  5 mL Intracatheter Q8H   Continuous Infusions: . sodium chloride 250 mL (03/07/19 2139)  .  ceFAZolin (ANCEF) IV 2 g (03/20/19 0522)  . sodium chloride       LOS: 26 days     Alwyn Ren, MD [facility]  If 7PM-7AM, please contact night-coverage [email or web address] Password [email or web address] 03/20/2019, 11:01 AM

## 2019-03-20 NOTE — Progress Notes (Signed)
PT Cancellation Note  Patient Details Name: ERYN KREJCI MRN: [id number] DOB: 16-Aug-1970   Cancelled Treatment:     pt out of room for CT.  Pt has been evaluated with rec for SNF.  Will continue to follow during his acute care stay.   Rica Koyanagi  PTA Acute  Rehabilitation Services Pager      [phone] Office      [phone]

## 2019-03-20 NOTE — Progress Notes (Signed)
Occupational Therapy Treatment Patient Details Name: Franklin Anderson MRN: [id number] DOB: [birth year] Today's Date: 03/20/2019    History of present illness Pt is 48 y/o M with PMH: IVDA (heroine), who presented with worsening back pain and B LE weakness. 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 Pt was intubated 10/28, extubated 10/31.   OT comments  Pt agreed to BUE only. Declined OOB due to pain. Pt very focused on lack of IV pain meds.  Follow Up Recommendations  Supervision/Assistance - 24 hour;Other (comment);SNF    Equipment Recommendations  (tba further)       Precautions / Restrictions Precautions Precautions: Fall              ADL either performed or assessed with clinical judgement   ADL Overall ADL's : Needs assistance/impaired                                       General ADL Comments: pt complaining about pain meds ( or lack of).  Pt agreeable to BUE exercise only with orange theraband     Vision Baseline Vision/History: No visual deficits            Cognition Arousal/Alertness: Awake/alert Behavior During Therapy: WFL for tasks assessed/performed Overall Cognitive Status: Within Functional Limits for tasks assessed                                          Exercises General Exercises - Upper Extremity Shoulder Flexion: 10 reps;Supine;Theraband Shoulder ABduction: 10 reps;Supine;Theraband;Both Shoulder ADduction: 10 reps;Supine;Theraband;Both Shoulder Horizontal ABduction: Both;10 reps Elbow Flexion: AROM;Theraband;Supine;Both Elbow Extension: 10 reps;Supine;Both;AROM Other Exercises Other Exercises: performed level 2 theraband for biceps and level one for shoulders within tolerance.  Pt familiar with exercises and didn't want handouts.  Tolerated well   Shoulder Instructions       General Comments      Pertinent Vitals/ Pain       Pain  Score: 10-Worst pain ever Faces Pain Scale: Hurts worst Pain Location: back in bed Pain Descriptors / Indicators: Discomfort;Guarding;Sharp;Grimacing Pain Intervention(s): Premedicated before session;Limited activity within patient's tolerance         Frequency  Min 2X/week        Progress Toward Goals  OT Goals(current goals can now be found in the care plan section)  Progress towards OT goals: Progressing toward goals     Plan Discharge plan remains appropriate       AM-PAC OT "6 Clicks" Daily Activity     Outcome Measure   Help from another person eating meals?: None Help from another person taking care of personal grooming?: A Little Help from another person toileting, which includes using toliet, bedpan, or urinal?: A Little Help from another person bathing (including washing, rinsing, drying)?: A Little Help from another person to put on and taking off regular upper body clothing?: A Little Help from another person to put on and taking off regular lower body clothing?: A Lot 6 Click Score: 18    End of Session    OT Visit Diagnosis: Unsteadiness on feet (R26.81);Muscle weakness (generalized) (M62.81)   Activity Tolerance Patient limited by pain   Patient Left in bed;with call bell/phone within reach   Nurse Communication  Time: 4599-7741 OT Time Calculation (min): 12 min  Charges: OT General Charges $OT Visit: 1 Visit OT Treatments $Self Care/Home Management : 8-22 mins  Kari Baars, [place] [phone] Office- [phone], Thereasa Parkin 03/20/2019, 6:33 PM

## 2019-03-21 LAB — GLUCOSE, CAPILLARY
Glucose-Capillary: 96 mg/dL (ref 70–99)
Glucose-Capillary: 119 mg/dL — ABNORMAL HIGH (ref 70–99)
Glucose-Capillary: 167 mg/dL — ABNORMAL HIGH (ref 70–99)
Glucose-Capillary: 102 mg/dL — ABNORMAL HIGH (ref 70–99)

## 2019-03-21 MED ORDER — HYDROMORPHONE HCL 2 MG PO TABS
2.0000 mg | ORAL_TABLET | ORAL | Status: DC | PRN
  Administered 2019-03-21 – 2019-04-11 (×103): 2 mg via ORAL
  Filled 2019-03-21 (×107): qty 1

## 2019-03-21 NOTE — Progress Notes (Signed)
Pt CBG at 0800 was 94.

## 2019-03-21 NOTE — Progress Notes (Signed)
Nutrition Follow-up  DOCUMENTATION CODES:   Severe malnutrition in context of social or environmental circumstances  INTERVENTION:   -Ensure Enlive po BID, each supplement provides 350 kcal and 20 grams of protein -Prostat liquid protein PO 30 mlTID with meals, each supplement provides 100 kcal, 15 grams protein. -Magic cup TID with meals, each supplement provides 290 kcal and 9 grams of protein -Multivitamin with minerals daily  NUTRITION DIAGNOSIS:   Severe Malnutrition related to social / environmental circumstances(IVDU) as evidenced by severe fat depletion, severe muscle depletion.  Ongoing.  GOAL:   Patient will meet greater than or equal to 90% of their needs  Progressing.  MONITOR:   Diet advancement, Supplement acceptance, Weight trends, Skin, I & O's, Labs  ASSESSMENT:   48 y.o. male with medical history of IVDU, ongoing heroin use. Patient presented to the ED on 10/27 with 3 week history of progressively worsening back pain and BLE weakness.  Denies any urinary retention but has noticed some bowel incontinence. Stated that he has had increasing trouble walking and that he has lost weight. Last use of heroin was today (10/27).  10/28 - intubated 10/31 - extubated 11/03 - TEE 11/04 - s/p chest tube insertion for empyema and pneumothorax 11/17- Chest tube d/c 11/18 - s/p image guided aspiration/possible drainageofright psoas abscess  Patient is currently consuming 100% of meals. Pt is drinking Ensure supplements and taking Prostat supplements.   Admission weight: 140 lbs. Current weight: 110 lbs.   I/Os: -20.4L since 11/10 UOP: 3050 ml x 24 hrs  Medications: Multivitamin with minerals daily   Labs reviewed: CBGs: 96-140  Diet Order:   Diet Order            Diet regular Room service appropriate? Yes; Fluid consistency: Thin  Diet effective now              EDUCATION NEEDS:   No education needs have been identified at this time  Skin:  Skin  Assessment: Skin Integrity Issues: Skin Integrity Issues:: Stage I, DTI DTI: rt hip Stage I: sacrum  Last BM:  11/23- type 4  Height:   Ht Readings from Last 1 Encounters:  03/15/19 6' 0.01" (1.829 m)    Weight:   Wt Readings from Last 1 Encounters:  03/15/19 49.9 kg    Ideal Body Weight:  80.9 kg  BMI:  Body mass index is 14.92 kg/m.  Estimated Nutritional Needs:   Kcal:  2100-2300  Protein:  100-120 grams  Fluid:  >/= 2.0 L  Clayton Bibles, MS, RD, LDN Inpatient Clinical Dietitian Pager: [phone] After Hours Pager: [phone]

## 2019-03-21 NOTE — Progress Notes (Signed)
PROGRESS NOTE    Franklin Anderson  JJK:[id number] DOB: March 21, 1971 DOA: 02/21/2019 PCP: Mack Hook, MD    Brief Narrative: 48 y.o.malewith medical history significant of rheumatic fever, HTN, ongoingIV heroin abuse, presented to the ED on 10/27 with 3 week course of progressively worsening back pain and BLE weakness associated with some bowel incontinence and trouble walking. Last use of heroin was on the day of admission. He was found to be septic with MSSA bacteremia, endocarditis, osteomyelitis, discitis at L4 and L5, septic pulmonary emboli with loculated effusion and pneumothorax and psoas abscesses. On 10/28 was found to be obtunded and dyspneic and was intubated by critical care, extubated on 10/31. Right psoas abscess was aspirated on 10/28, wound care >MSSA. Respiratory culture from 10/28 also grew MSSA.  Patient had acute respiratory distress on 02/28/2019 was intubated. He had chest tube placement for loculated pneumothorax/CT of the chest indicating empyema pulmonary embolism hydropneumothorax   Assessment & Plan:   Principal Problem:   Sepsis ([place]) Active Problems:   Dyspnea   Heroin use   Endocarditis   Septic pulmonary embolism (HCC)   Osteomyelitis of thoracic spine (HCC)   Osteomyelitis of lumbar spine (HCC)   Acute encephalopathy   AKI (acute kidney injury) ([place])   Transaminitis   Psoas abscess (HCC)   Pressure injury of skin   Mucus clot in bronchi   Protein-calorie malnutrition, severe   Abnormal echocardiogram   MSSA bacteremia   Acute pulmonary embolism (HCC)   Pleural effusion on left   Empyema, left (HCC)   Chest tube in place   Pain   Pneumothorax on left   Fistula   Epidural abscess   #1 MSSA bacteremia with discitis and vertebral osteomyelitis and psoas abscess with septic pulmonary embolism/cavitary lesion andright ventricularvegetation- CT with persistent loculated left hydropneumothorax with decreased pneumothorax. Will  septic emboli throughout both lungs remain unchanged. CT surgery thinks he is not a good candidate for thoracotomy and muscle flap closure. Followed by ID recommending 6 weeks of Ancef in the hospital and then discharged home on Keflex for another 30 days. Ancef was started 02/24/2019. He is status post chest tube placement for bronchopleural fistula. Paraspinal abscess fluid culture 03/15/2019 abend WBC predominantly polymorphonuclear leukocytes no organisms or no growth. I have stopped IV Dilaudid.  Increase p.o. Dilaudid to 2 mg every 4 as needed.  Repeat CT 11/23--The right paraspinous scratched at the larger of the 2 right paraspinous fluid collections is drained.  The smaller, more superior posterior collection remains, measuring up to 12 mm.  Stable lytic endplate changes associated with disc osteomyelitis at L4-5. Collapse is worse on the right, stable from prior exam.  Subtle endplate changes posteriorly at L2-3 and at the right facet of L5-S1 without progression.  #2 back pain secondary to bilateral psoas abscess with progression of discitis and osteomyelitis with nerve compression and worsening of stenosis. Patient had CT-guided right psoas aspiration by IR 02/22/2019 yielding 18 mL of purulent fluid with MSSA. Patient has recurrent psoas abscess.He is status post CT-guided placement of drainage catheter to the recurrent right paraspinal abscess yielded 15 cc of purulent material 03/16/2019. He had a drain output of 15 cc yesterday including the flush.  Today with very minimal drainage in the bulb.   #3 acute hypoxic respiratory failure due to multiple pulmonary embolism and sepsis patient intubated 02/22/2019 and extubated 02/25/2019.Patient has loculated effusion with pneumothorax and bronchopleural fistula he is status post chest tube placement which is out now. Chest  x-rayreviewed by me 03/17/2019 shows stable mild left pneumothorax stable bilateral nodular densities  consistent with septic emboli.  #4 pericardial effusion noted on the echo none on repeat CT scan.  #5 acute metabolic encephalopathy resolved.  #6 AKI resolved.  #7 hypernatremia resolved.  #8 hypertension blood pressure138/99 The current medical regimen is effective;  continue present plan and medications.  Norvasc 5 mg daily with metoprolol.    #9 pressure injury with stage I continue wound care  #10 severe protein calorie malnutrition severe albuminemia dietary consulted and is following. Recommending Ensure Enlive p.o. twice daily, prostat liquid protein 3 times daily with meals, Magic cup 3 times daily with meals and multivitamins with mineral  Pressure Injury 02/22/19 Sacrum Mid Stage I -  Intact skin with non-blanchable redness of a localized area usually over a bony prominence. (Active)  02/22/19 0420  Location: Sacrum  Location Orientation: Mid  Staging: Stage I -  Intact skin with non-blanchable redness of a localized area usually over a bony prominence.  Wound Description (Comments):   Present on Admission: Yes     Pressure Injury 03/13/19 Hip Right Deep Tissue Injury - Purple or maroon localized area of discolored intact skin or blood-filled blister due to damage of underlying soft tissue from pressure and/or shear. purple area, skin intact (Active)  03/13/19 0800  Location: Hip  Location Orientation: Right  Staging: Deep Tissue Injury - Purple or maroon localized area of discolored intact skin or blood-filled blister due to damage of underlying soft tissue from pressure and/or shear.  Wound Description (Comments): purple area, skin intact  Present on Admission: No      Nutrition Problem: Severe Malnutrition Etiology: social / environmental circumstances([facility])     Signs/Symptoms: severe fat depletion, severe muscle depletion    Interventions: Ensure Enlive (each supplement provides 350kcal and 20 grams of protein), MVI  Estimated body mass index is  14.92 kg/m as calculated from the following:   Height as of this encounter: 6' 0.01" (1.829 m).   Weight as of this encounter: 49.9 kg.  DVT prophylaxisHeparin  code Status:Full code  family Communication:None Disposition Plan:Pending clinical improvement and completion of IV Ancef   consultants:  PCCM, cardiology, ID, palliative care, cardiothoracic surgery  Procedures:CT-guided drainage of right psoas abscess 02/22/2019 Antimicrobials:Ancef started 02/24/2019  Subjective:  Patient requesting Dilaudid to be increased.  Has a lot of pain cannot get out of bed due to increased pain.  Objective: Vitals:   03/20/19 0528 03/20/19 1422 03/20/19 2004 03/21/19 0453  BP: 124/89 109/73 (!) 133/95 (!) 138/99  Pulse: 90 99 92 94  Resp: 17 16 16 17   Temp: 97.6 F (36.4 C) 98.2 F (36.8 C) 97.6 F (36.4 C) 98.1 F (36.7 C)  TempSrc: Oral Oral Oral Oral  SpO2: 98% 98% 100% 99%  Weight:      Height:        Intake/Output Summary (Last 24 hours) at 03/21/2019 1231 Last data filed at 03/21/2019 1000 Gross per 24 hour  Intake 2100 ml  Output 2765 ml  Net -665 ml   Filed Weights   03/14/19 0500 03/15/19 0522 03/15/19 2000  Weight: 49.8 kg 49.9 kg 49.9 kg    Examination:  General exam: Appears calm and comfortable  Respiratory system: Clear to auscultation. Respiratory effort normal. Cardiovascular system: S1 & S2 heard, RRR. No JVD, murmurs, rubs, gallops or clicks. No pedal edema. Gastrointestinal system: Abdomen is nondistended, soft and nontender. No organomegaly or masses felt. Normal bowel sounds heard.  Right drain in place with minimal serosanguineous Central nervous system: Alert and oriented. No focal neurological deficits. Extremities: Symmetric 5 x 5 power. Skin: No rashes, lesions or ulcers Psychiatry: Judgement and insight appear normal. Mood & affect appropriate.     Data Reviewed: I have personally reviewed following labs and imaging  studies  CBC: Recent Labs  Lab 03/15/19 0545 03/19/19 1103  WBC 6.8 7.9  NEUTROABS 3.8  --   HGB 8.8* 8.7*  HCT 28.8* 27.7*  MCV 93.8 93.0  PLT 548* 607*   Basic Metabolic Panel: Recent Labs  Lab 03/15/19 0545 03/19/19 1103  NA 133* 134*  K 4.3 3.8  CL 97* 97*  CO2 27 28  GLUCOSE 99 106*  BUN 12 14  CREATININE 0.36* 0.48*  CALCIUM 8.4* 8.4*   GFR: Estimated Creatinine Clearance: 78.8 mL/min (A) (by C-G formula based on SCr of 0.48 mg/dL (L)). Liver Function Tests: Recent Labs  Lab 03/15/19 0545 03/19/19 1103  AST 25 22  ALT 21 17  ALKPHOS 99 91  BILITOT 0.2* 0.7  PROT 6.8 6.8  ALBUMIN 2.0* 2.1*   No results for input(s): LIPASE, AMYLASE in the last 168 hours. No results for input(s): AMMONIA in the last 168 hours. Coagulation Profile: Recent Labs  Lab 03/15/19 0545  INR 1.2   Cardiac Enzymes: No results for input(s): CKTOTAL, CKMB, CKMBINDEX, TROPONINI in the last 168 hours. BNP (last 3 results) No results for input(s): PROBNP in the last 8760 hours. HbA1C: No results for input(s): HGBA1C in the last 72 hours. CBG: Recent Labs  Lab 03/20/19 0750 03/20/19 1201 03/20/19 1634 03/21/19 0758 03/21/19 1202  GLUCAP 87 127* 140* 96 119*   Lipid Profile: No results for input(s): CHOL, HDL, LDLCALC, TRIG, CHOLHDL, LDLDIRECT in the last 72 hours. Thyroid Function Tests: No results for input(s): TSH, T4TOTAL, FREET4, T3FREE, THYROIDAB in the last 72 hours. Anemia Panel: No results for input(s): VITAMINB12, FOLATE, FERRITIN, TIBC, IRON, RETICCTPCT in the last 72 hours. Sepsis Labs: No results for input(s): PROCALCITON, LATICACIDVEN in the last 168 hours.  Recent Results (from the past 240 hour(s))  Aerobic/Anaerobic Culture (surgical/deep wound)     Status: None   Collection Time: 03/15/19  4:38 PM   Specimen: Abscess  Result Value Ref Range Status   Specimen Description   Final    ABSCESS PARASPINAL Performed at [facility], [address] [place], [place] [address]    Special Requests   Final    Normal Performed at [facility], [address], [place], [place] [address]    Gram Stain   Final    ABUNDANT WBC PRESENT, PREDOMINANTLY PMN NO ORGANISMS SEEN    Culture   Final    No growth aerobically or anaerobically. Performed at [facility], [address] [place], [place] [address]    Report Status 03/20/2019 FINAL  Final         Radiology Studies: Ct Lumbar Spine W Contrast  Result Date: 03/20/2019 CLINICAL DATA:  Abdominal pain and fever. Abscess. At the drug abuse. Progressively worse back pain and bilateral lower extremity weakness. EXAM: CT LUMBAR SPINE WITH CONTRAST TECHNIQUE: Multidetector CT imaging of the lumbar spine was performed with intravenous contrast administration. CONTRAST:  OMNIPAQUE IOHEXOL 300 MG/ML  SOLN COMPARISON:  MRI of the lumbar spine 03/14/2019. CT guided drain placement 03/15/2019 FINDINGS: Segmentation: 5 non rib-bearing lumbar type vertebral bodies are present. The lowest fully formed vertebral body is L5. Alignment:  No significant listhesis is present. There is straightening of the normal lumbar lordosis. Rightward curvature is present at the thoracolumbar junction. Compensatory leftward curvature is present at L5-S1. Vertebrae: Lytic changes are noted at the endplates of L4-5 with some collapse on right. There is no progression collapse. Vertebral body heights are otherwise maintained. No other focal lytic or blastic lesions are present. Paraspinal and other soft tissues: Right paraspinal drain is in place. The largest collection collapsed. A more posterior and slightly superior peripherally enhancing collection measures 12 mm. No significant adenopathy is present. Atherosclerotic changes are noted in the aorta without aneurysm. Disc levels: L1-2: Negative. L2-3: No significant erosive changes are present. Leftward disc protrusion is noted. L3-4: A leftward  disc protrusion present. Mild facet hypertrophy is noted bilaterally. L4-5: A broad-based disc protrusion is associated with the infected disc. Moderate foraminal stenosis is again seen bilaterally. L5-S1: Mild disc bulging is present. Facet hypertrophy is worse on the right. IMPRESSION: 1. The right paraspinous scratched at the larger of the 2 right paraspinous fluid collections is drained. 2. The smaller, more superior posterior collection remains, measuring up to 12 mm. 3. Stable lytic endplate changes associated with disc osteomyelitis at L4-5. Collapse is worse on the right, stable from prior exam. 4. Subtle endplate changes posteriorly at L2-3 and at the right facet of L5-S1 without progression. 5.  Aortic Atherosclerosis (ICD10-I70.0). Electronically Signed   By: Marin Robertshristopher  Mattern M.D.   On: 03/20/2019 15:41        Scheduled Meds:  amLODipine  5 mg Oral Daily   Chlorhexidine Gluconate Cloth  6 each Topical Daily   feeding supplement (ENSURE ENLIVE)  237 mL Oral BID BM   feeding supplement (PRO-STAT SUGAR FREE 64)  30 mL Oral TID   insulin aspart  0-24 Units Subcutaneous TID AC & HS   lidocaine  1 patch Transdermal Q24H   mouth rinse  15 mL Mouth Rinse BID   metoprolol tartrate  50 mg Oral BID   multivitamin with minerals  1 tablet Oral Daily   oxyCODONE  15 mg Oral Q12H   pantoprazole  40 mg Oral Daily   polyethylene glycol  17 g Oral Daily   sodium chloride flush  10-40 mL Intracatheter Q12H   sodium chloride flush  5 mL Intracatheter Q8H   Continuous Infusions:  sodium chloride 250 mL (03/07/19 2139)    ceFAZolin (ANCEF) IV 2 g (03/21/19 0641)   sodium chloride       LOS: 27 days     Alwyn RenElizabeth G Kavita Bartl, MD [facility]  If 7PM-7AM, please contact night-coverage [email or web address] Password [email or web address] 03/21/2019, 12:31 PM

## 2019-03-22 LAB — GLUCOSE, CAPILLARY
Glucose-Capillary: 104 mg/dL — ABNORMAL HIGH (ref 70–99)
Glucose-Capillary: 136 mg/dL — ABNORMAL HIGH (ref 70–99)
Glucose-Capillary: 152 mg/dL — ABNORMAL HIGH (ref 70–99)
Glucose-Capillary: 117 mg/dL — ABNORMAL HIGH (ref 70–99)

## 2019-03-22 NOTE — Progress Notes (Signed)
Referring Physician(s): Franklin Anderson  Supervising Physician: Franklin Anderson  Patient Status:  Franklin Anderson - In-pt  Chief Complaint:  Back pain/abscess  Subjective: Pt continues to c/o of back pain with little relief from meds, exacerbated by movement   Allergies: Penicillins  Medications: Prior to Admission medications   Medication Sig Start Date End Date Taking? Authorizing Provider  Multiple Vitamin (MULTIVITAMIN WITH MINERALS) TABS tablet Take 1 tablet by mouth daily.   Yes [provider]  lisinopril (ZESTRIL) 10 MG tablet Take 1 tablet by mouth daily Patient not taking: Reported on 02/21/2019 01/20/19   Franklin Manson, MD     Vital Signs: BP (!) 140/98 (BP Location: Left Arm)   Pulse 89   Temp 97.8 F (36.6 C) (Oral)   Resp (!) 21   Ht 6' 0.01" (1.829 m)   Wt 110 lb 0.2 oz (49.9 kg)   SpO2 99%   BMI 14.92 kg/m   Physical Exam awake/alert; rt flank drain intact, insertion site mod tender, output 25 cc serosang fluid; drain flushed without difficulty  Imaging: Ct Lumbar Spine W Contrast  Result Date: 03/20/2019 CLINICAL DATA:  Abdominal pain and fever. Abscess. At the drug abuse. Progressively worse back pain and bilateral lower extremity weakness. EXAM: CT LUMBAR SPINE WITH CONTRAST TECHNIQUE: Multidetector CT imaging of the lumbar spine was performed with intravenous contrast administration. CONTRAST:  OMNIPAQUE IOHEXOL 300 MG/ML  SOLN COMPARISON:  MRI of the lumbar spine 03/14/2019. CT guided drain placement 03/15/2019 FINDINGS: Segmentation: 5 non rib-bearing lumbar type vertebral bodies are present. The lowest fully formed vertebral body is L5. Alignment: No significant listhesis is present. There is straightening of the normal lumbar lordosis. Rightward curvature is present at the thoracolumbar junction. Compensatory leftward curvature is present at L5-S1. Vertebrae: Lytic changes are noted at the endplates of L4-5 with some collapse on right. There is no  progression collapse. Vertebral body heights are otherwise maintained. No other focal lytic or blastic lesions are present. Paraspinal and other soft tissues: Right paraspinal drain is in place. The largest collection collapsed. A more posterior and slightly superior peripherally enhancing collection measures 12 mm. No significant adenopathy is present. Atherosclerotic changes are noted in the aorta without aneurysm. Disc levels: L1-2: Negative. L2-3: No significant erosive changes are present. Leftward disc protrusion is noted. L3-4: A leftward disc protrusion present. Mild facet hypertrophy is noted bilaterally. L4-5: A broad-based disc protrusion is associated with the infected disc. Moderate foraminal stenosis is again seen bilaterally. L5-S1: Mild disc bulging is present. Facet hypertrophy is worse on the right. IMPRESSION: 1. The right paraspinous scratched at the larger of the 2 right paraspinous fluid collections is drained. 2. The smaller, more superior posterior collection remains, measuring up to 12 mm. 3. Stable lytic endplate changes associated with disc osteomyelitis at L4-5. Collapse is worse on the right, stable from prior exam. 4. Subtle endplate changes posteriorly at L2-3 and at the right facet of L5-S1 without progression. 5.  Aortic Atherosclerosis (ICD10-I70.0). Electronically Signed   By: Franklin Anderson M.D.   On: 03/20/2019 15:41    Labs:  CBC: Recent Labs    03/11/19 1124 03/13/19 0936 03/15/19 0545 03/19/19 1103  WBC 8.4 7.7 6.8 7.9  HGB 8.5* 8.0* 8.8* 8.7*  HCT 26.9* 26.1* 28.8* 27.7*  PLT 419* 434* 548* 607*    COAGS: Recent Labs    02/21/19 2013 02/22/19 1850 02/28/19 2331 03/15/19 0545  INR 1.4* 1.7* 1.4* 1.2  APTT 24 28 30   --  BMP: Recent Labs    03/11/19 1124 03/13/19 0936 03/15/19 0545 03/19/19 1103  NA 134* 133* 133* 134*  K 4.8 4.1 4.3 3.8  CL 97* 96* 97* 97*  CO2 29 30 27 28   GLUCOSE 108* 122* 99 106*  BUN 14 14 12 14   CALCIUM  8.0* 8.2* 8.4* 8.4*  CREATININE 0.38* 0.38* 0.36* 0.48*  GFRNONAA >60 >60 >60 >60  GFRAA >60 >60 >60 >60    LIVER FUNCTION TESTS: Recent Labs    02/25/19 0440 03/08/19 0610 03/15/19 0545 03/19/19 1103  BILITOT 0.6 0.4 0.2* 0.7  AST 23 73* 25 22  ALT 28 42 21 17  ALKPHOS 81 92 99 91  PROT 6.4* 6.5 6.8 6.8  ALBUMIN 1.4* 1.7* 2.0* 2.1*    Assessment and Plan: Patient with history of intravenous drug abuse and recurrent right-sided paraspinal abscess; status post drain placement on 03/15/2019; afebrile; no new labs; drain fluid cx neg; latest CT L spine reviewed by Franklin Anderson; rec cont of drain until OP < 10 cc/day excluding flush amount, then can consider removal   Electronically Signed: D. Rowe Robert, PA-C 03/22/2019, 10:41 AM   I spent a total of 15 minutes at the the patient's bedside AND on the patient's hospital floor or unit, greater than 50% of which was counseling/coordinating care for right paraspinal abscess drain    Patient ID: Franklin Anderson, male   DOB: 1970-06-10, 48 y.o.   MRN: [id number]

## 2019-03-22 NOTE — Progress Notes (Signed)
Physical Therapy Treatment Patient Details Name: Franklin Anderson MRN: [id number] DOB: Jul 12, 1970 Today's Date: 03/22/2019    History of Present Illness Pt is 48 y/o M with PMH: IVDA (heroine), who presented with worsening back pain and B LE weakness. 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 Pt was intubated 10/28, extubated 10/31.    PT Comments    Patient continues to be limited by pain with therapy but was agreeable to participate. He required min assist for bed mobility and sit<>stand transfers to complete rise but was able to initiate mobility for each task. Pt had difficulty with managing RW due to pain in low back with use of UE's and required mod assist with cues for step sequencing and to move RW to take lateral steps at EOB. Pt will continue to benefit from skilled PT interventions to address impairments and progress towards goals. Goals extended by additional week.    Follow Up Recommendations  SNF     Equipment Recommendations  Other (comment)(TBD)    Recommendations for Other Services       Precautions / Restrictions Precautions Precautions: Fall Restrictions Weight Bearing Restrictions: No    Mobility  Bed Mobility Overal bed mobility: Needs Assistance Bed Mobility: Rolling;Supine to Sit;Sit to Supine Rolling: Min guard   Supine to sit: HOB elevated;Min assist Sit to supine: Min assist;Mod assist   General bed mobility comments: verbal cues for sequencing use of bed rail, assist to bring bil LE's off EOB and to raise trunk upright. pt required mod assist +2 for sit to supine transfer to manage raising LE's into bed and lowering trunk.  Transfers Overall transfer level: Needs assistance Equipment used: Rolling walker (2 wheeled) Transfers: Sit to/from Stand Sit to Stand: Min assist;+2 safety/equipment;From elevated surface         General transfer comment: pt abelt o scoot atneriorly to  EOB to prepare for stand with min guard for safety, he initaited power up and required min assist to complete rise from 1 therapist, therapy tech guarding for safety.  Ambulation/Gait Ambulation/Gait assistance: Mod assist;+2 safety/equipment Gait Distance (Feet): 4 Feet Assistive device: Rolling walker (2 wheeled) Gait Pattern/deviations: Antalgic;Step-to pattern;Decreased step length - right;Decreased step length - left;Decreased stride length;Narrow base of support Gait velocity: slow   General Gait Details: pt performed lateral stepping with RW along EOB. Pt requried verbal cues to sequence safe step pattern and assist to slide RW as he was limited with lifting/rolling walker due to pain.   Stairs             Wheelchair Mobility    Modified Rankin (Stroke Patients Only)       Balance Overall balance assessment: Needs assistance Sitting-balance support: Bilateral upper extremity supported;Feet supported Sitting balance-Leahy Scale: Fair     Standing balance support: Bilateral upper extremity supported;During functional activity Standing balance-Leahy Scale: Fair             Cognition Arousal/Alertness: Awake/alert Behavior During Therapy: WFL for tasks assessed/performed Overall Cognitive Status: Within Functional Limits for tasks assessed        General Comments: pt agreeable to therpay today, pt complaining of pain throughout      Exercises      General Comments        Pertinent Vitals/Pain Pain Assessment: Faces Faces Pain Scale: Hurts worst Pain Location: back with mobility Pain Descriptors / Indicators: Guarding;Grimacing;Moaning;Sharp Pain Intervention(s): Limited activity within patient's tolerance;Monitored during session  PT Goals (current goals can now be found in the care plan section) Acute Rehab PT Goals Patient Stated Goal: To get stronger and make better decisions PT Goal Formulation: With patient Time For Goal  Achievement: 03/29/19(date extended) Potential to Achieve Goals: Fair Progress towards PT goals: Progressing toward goals    Frequency    Min 2X/week      PT Plan Current plan remains appropriate       AM-PAC PT "6 Clicks" Mobility   Outcome Measure  Help needed turning from your back to your side while in a flat bed without using bedrails?: A Little Help needed moving from lying on your back to sitting on the side of a flat bed without using bedrails?: A Little Help needed moving to and from a bed to a chair (including a wheelchair)?: A Lot Help needed standing up from a chair using your arms (e.g., wheelchair or bedside chair)?: A Lot Help needed to walk in hospital room?: A Lot Help needed climbing 3-5 steps with a railing? : Total 6 Click Score: 13    End of Session Equipment Utilized During Treatment: Gait belt Activity Tolerance: Patient limited by pain;Other (comment)(self-limits) Patient left: with call bell/phone within reach;in bed;with bed alarm set(pt up in chair without chair alarm upon PT arrival) Nurse Communication: Mobility status PT Visit Diagnosis: Muscle weakness (generalized) (M62.81);Difficulty in walking, not elsewhere classified (R26.2)     Time: 1410-1431 PT Time Calculation (min) (ACUTE ONLY): 21 min  Charges:  $Therapeutic Activity: 8-22 mins                     Kipp Brood, PT, DPT Physical Therapist with [facility]  03/22/2019 3:07 PM

## 2019-03-22 NOTE — Progress Notes (Signed)
PROGRESS NOTE    Franklin Anderson  KCL:[id number] DOB: 1970-11-26 DOA: 02/21/2019 PCP: Julieanne Manson, MD    Brief Narrative:48 y.o.malewith medical history significant of rheumatic fever, HTN, ongoingIV heroin abuse, presented to the ED on 10/27 with 3 week course of progressively worsening back pain and BLE weakness associated with some bowel incontinence and trouble walking. Last use of heroin was on the day of admission. He was found to be septic with MSSA bacteremia, endocarditis, osteomyelitis, discitis at L4 and L5, septic pulmonary emboli with loculated effusion and pneumothorax and psoas abscesses. On 10/28 was found to be obtunded and dyspneic and was intubated by critical care, extubated on 10/31. Right psoas abscess was aspirated on 10/28, wound care >MSSA. Respiratory culture from 10/28 also grew MSSA.  Patient had acute respiratory distress on 02/28/2019 was intubated. He had chest tube placement for loculated pneumothorax/CT of the chest indicating empyema pulmonary embolism hydropneumothorax   Assessment & Plan:   Principal Problem:   Sepsis (HCC) Active Problems:   Dyspnea   Heroin use   Endocarditis   Septic pulmonary embolism (HCC)   Osteomyelitis of thoracic spine (HCC)   Osteomyelitis of lumbar spine (HCC)   Acute encephalopathy   AKI (acute kidney injury) (HCC)   Transaminitis   Psoas abscess (HCC)   Pressure injury of skin   Mucus clot in bronchi   Protein-calorie malnutrition, severe   Abnormal echocardiogram   MSSA bacteremia   Acute pulmonary embolism (HCC)   Pleural effusion on left   Empyema, left (HCC)   Chest tube in place   Pain   Pneumothorax on left   Fistula   Epidural abscess   #1 MSSA bacteremia with discitis and vertebral osteomyelitis and psoas abscess with septic pulmonary embolism/cavitary lesion andright ventricularvegetation- CT with persistent loculated left hydropneumothorax with decreased pneumothorax. Will  septic emboli throughout both lungs remain unchanged. CT surgery thinks he is not a good candidate for thoracotomy and muscle flap closure. Followed by ID recommending 6 weeks of Ancef in the hospital and then discharged home on Keflex for another 30 days. Ancef was started 02/24/2019. He is status post chest tube placement for bronchopleural fistula. Paraspinal abscess fluid culture 03/15/2019 abend WBC predominantly polymorphonuclear leukocytes no organisms or no growth. I have stopped IV Dilaudid.  Increase p.o. Dilaudid to 2 mg every 4 as needed. Follow-up labs in a.m. Repeat CT 11/23--The right paraspinous scratched at the larger of the 2 right paraspinous fluid collections is drained.  The smaller, more superior posterior collection remains, measuring up to 12 mm.  Stable lytic endplate changes associated with disc osteomyelitis at L4-5. Collapse is worse on the right, stable from prior exam.  Subtle endplate changes posteriorly at L2-3 and at the right facet of L5-S1 without progression.  #2 back pain secondary to bilateral psoas abscess with progression of discitis and osteomyelitis with nerve compression and worsening of stenosis. Patient had CT-guided right psoas aspiration by IR 02/22/2019 yielding 18 mL of purulent fluid with MSSA. Patient has recurrent psoas abscess.He is status post CT-guided placement of drainage catheter to the recurrent right paraspinal abscess yielded 15 cc of purulent material 03/16/2019. He had a drain output of 15 cc yesterday including the flush.  Today with very minimal drainage in the bulb.    #3 acute hypoxic respiratory failure due to multiple pulmonary embolism and sepsis patient intubated 02/22/2019 and extubated 02/25/2019.Patient has loculated effusion with pneumothorax and bronchopleural fistula he is status post chest tube placement which is  out now. Chest x-rayreviewed by me 03/17/2019 shows stable mild left pneumothorax stable  bilateral nodular densities consistent with septic emboli.  #4 pericardial effusion noted on the echo none on repeat CT scan.  #5 acute metabolic encephalopathy resolved.  #6 AKI resolved.  #7 hypernatremia resolved.  #8 hypertension blood pressure138/99The current medical regimen is effective;  continue present plan and medications.  Norvasc 5 mg daily with metoprolol.   #9 pressure injury with stage I continue wound care  #10 severe protein calorie malnutrition severe albuminemia dietary consulted and is following. Recommending Ensure Enlive p.o. twice daily, prostat liquid protein 3 times daily with meals, Magic cup 3 times daily with meals and multivitamins with mineral  Pressure Injury 02/22/19 Sacrum Mid Stage I -  Intact skin with non-blanchable redness of a localized area usually over a bony prominence. (Active)  02/22/19 0420  Location: Sacrum  Location Orientation: Mid  Staging: Stage I -  Intact skin with non-blanchable redness of a localized area usually over a bony prominence.  Wound Description (Comments):   Present on Admission: Yes     Pressure Injury 03/13/19 Hip Right Deep Tissue Injury - Purple or maroon localized area of discolored intact skin or blood-filled blister due to damage of underlying soft tissue from pressure and/or shear. purple area, skin intact (Active)  03/13/19 0800  Location: Hip  Location Orientation: Right  Staging: Deep Tissue Injury - Purple or maroon localized area of discolored intact skin or blood-filled blister due to damage of underlying soft tissue from pressure and/or shear.  Wound Description (Comments): purple area, skin intact  Present on Admission: No      Nutrition Problem: Severe Malnutrition Etiology: social / environmental circumstances(IVDU)     Signs/Symptoms: severe fat depletion, severe muscle depletion    Interventions: Ensure Enlive (each supplement provides 350kcal and 20 grams of protein),  MVI  Estimated body mass index is 14.92 kg/m as calculated from the following:   Height as of this encounter: 6' 0.01" (1.829 m).   Weight as of this encounter: 49.9 kg.  DVT prophylaxisHeparin  code Status:Full code  family Communication:None Disposition Plan:Pending clinical improvement and completion of IV Ancef /he can really be discharged to a skilled nursing facility since he has IV Ancef with history of IV drug use.  Social worker is working on this.  consultants:  PCCM, cardiology, ID, palliative care, cardiothoracic surgery  Procedures:CT-guided drainage of right psoas abscess 02/22/2019 Antimicrobials:Ancef started 02/24/2019  Subjective:  Resting in bed no new complaints Dilaudid dose was increased yesterday to 2 mg every 4 as needed IV Dilaudid stopped this was discussed with the patient extensively.    Objective: Vitals:   03/21/19 0453 03/21/19 1338 03/21/19 2041 03/22/19 0538  BP: (!) 138/99 (!) 144/90 140/89 (!) 140/98  Pulse: 94 (!) 104 100 89  Resp: (!) 21  Temp: 98.1 F (36.7 C) 98 F (36.7 C) 98.3 F (36.8 C) 97.8 F (36.6 C)  TempSrc: Oral Oral Oral Oral  SpO2: 99% 100% 98% 99%  Weight:      Height:        Intake/Output Summary (Last 24 hours) at 03/22/2019 1133 Last data filed at 03/22/2019 0600 Gross per 24 hour  Intake 725 ml  Output 2675 ml  Net -1950 ml   Filed Weights   03/14/19 0500 03/15/19 0522 03/15/19 2000  Weight: 49.8 kg 49.9 kg 49.9 kg    Examination:  General exam: Appears calm and comfortable  Respiratory system: Clear  to auscultation. Respiratory effort normal. Cardiovascular system: S1 & S2 heard, RRR. No JVD, murmurs, rubs, gallops or clicks. No pedal edema. Gastrointestinal system: Abdomen is nondistended, soft and nontender. No organomegaly or masses felt. Normal bowel sounds heard. Central nervous system: Alert and oriented. No focal neurological deficits. Extremities: Symmetric 5 x 5 power. Skin:  No rashes, lesions or ulcers Psychiatry: Judgement and insight appear normal. Mood & affect appropriate.     Data Reviewed: I have personally reviewed following labs and imaging studies  CBC: Recent Labs  Lab 03/19/19 1103  WBC 7.9  HGB 8.7*  HCT 27.7*  MCV 93.0  PLT 433*   Basic Metabolic Panel: Recent Labs  Lab 03/19/19 1103  NA 134*  K 3.8  CL 97*  CO2 28  GLUCOSE 106*  BUN 14  CREATININE 0.48*  CALCIUM 8.4*   GFR: Estimated Creatinine Clearance: 78.8 mL/min (A) (by C-G formula based on SCr of 0.48 mg/dL (L)). Liver Function Tests: Recent Labs  Lab 03/19/19 1103  AST 22  ALT 17  ALKPHOS 91  BILITOT 0.7  PROT 6.8  ALBUMIN 2.1*   No results for input(s): LIPASE, AMYLASE in the last 168 hours. No results for input(s): AMMONIA in the last 168 hours. Coagulation Profile: No results for input(s): INR, PROTIME in the last 168 hours. Cardiac Enzymes: No results for input(s): CKTOTAL, CKMB, CKMBINDEX, TROPONINI in the last 168 hours. BNP (last 3 results) No results for input(s): PROBNP in the last 8760 hours. HbA1C: No results for input(s): HGBA1C in the last 72 hours. CBG: Recent Labs  Lab 03/21/19 0758 03/21/19 1202 03/21/19 1708 03/21/19 2114 03/22/19 0758  GLUCAP 96 119* 167* 102* 104*   Lipid Profile: No results for input(s): CHOL, HDL, LDLCALC, TRIG, CHOLHDL, LDLDIRECT in the last 72 hours. Thyroid Function Tests: No results for input(s): TSH, T4TOTAL, FREET4, T3FREE, THYROIDAB in the last 72 hours. Anemia Panel: No results for input(s): VITAMINB12, FOLATE, FERRITIN, TIBC, IRON, RETICCTPCT in the last 72 hours. Sepsis Labs: No results for input(s): PROCALCITON, LATICACIDVEN in the last 168 hours.  Recent Results (from the past 240 hour(s))  Aerobic/Anaerobic Culture (surgical/deep wound)     Status: None   Collection Time: 03/15/19  4:38 PM   Specimen: Abscess  Result Value Ref Range Status   Specimen Description   Final    ABSCESS  PARASPINAL Performed at [facility], [address] [place], [place] [address]    Special Requests   Final    Normal Performed at [facility] [address], [place], [place] [address]    Gram Stain   Final    ABUNDANT WBC PRESENT, PREDOMINANTLY PMN NO ORGANISMS SEEN    Culture   Final    No growth aerobically or anaerobically. Performed at [facility], [place] [address] [place], [place] [address]    Report Status 03/20/2019 FINAL  Final         Radiology Studies: Ct Lumbar Spine W Contrast  Result Date: 03/20/2019 CLINICAL DATA:  Abdominal pain and fever. Abscess. At the drug abuse. Progressively worse back pain and bilateral lower extremity weakness. EXAM: CT LUMBAR SPINE WITH CONTRAST TECHNIQUE: Multidetector CT imaging of the lumbar spine was performed with intravenous contrast administration. CONTRAST:  153mL OMNIPAQUE IOHEXOL 300 MG/ML  SOLN COMPARISON:  MRI of the lumbar spine 03/14/2019. CT guided drain placement 03/15/2019 FINDINGS: Segmentation: 5 non rib-bearing lumbar type vertebral bodies are present. The lowest fully formed vertebral body is L5.  Alignment: No significant listhesis is present. There is straightening of the normal lumbar lordosis. Rightward curvature is present at the thoracolumbar junction. Compensatory leftward curvature is present at L5-S1. Vertebrae: Lytic changes are noted at the endplates of L4-5 with some collapse on right. There is no progression collapse. Vertebral body heights are otherwise maintained. No other focal lytic or blastic lesions are present. Paraspinal and other soft tissues: Right paraspinal drain is in place. The largest collection collapsed. A more posterior and slightly superior peripherally enhancing collection measures 12 mm. No significant adenopathy is present. Atherosclerotic changes are noted in the aorta without aneurysm. Disc levels: L1-2: Negative. L2-3: No significant erosive changes are  present. Leftward disc protrusion is noted. L3-4: A leftward disc protrusion present. Mild facet hypertrophy is noted bilaterally. L4-5: A broad-based disc protrusion is associated with the infected disc. Moderate foraminal stenosis is again seen bilaterally. L5-S1: Mild disc bulging is present. Facet hypertrophy is worse on the right. IMPRESSION: 1. The right paraspinous scratched at the larger of the 2 right paraspinous fluid collections is drained. 2. The smaller, more superior posterior collection remains, measuring up to 12 mm. 3. Stable lytic endplate changes associated with disc osteomyelitis at L4-5. Collapse is worse on the right, stable from prior exam. 4. Subtle endplate changes posteriorly at L2-3 and at the right facet of L5-S1 without progression. 5.  Aortic Atherosclerosis (ICD10-I70.0). Electronically Signed   By: Marin Robertshristopher  Mattern M.D.   On: 03/20/2019 15:41        Scheduled Meds:  amLODipine  5 mg Oral Daily   Chlorhexidine Gluconate Cloth  6 each Topical Daily   feeding supplement (ENSURE ENLIVE)  237 mL Oral BID BM   feeding supplement (PRO-STAT SUGAR FREE 64)  30 mL Oral TID   insulin aspart  0-24 Units Subcutaneous TID AC & HS   lidocaine  1 patch Transdermal Q24H   mouth rinse  15 mL Mouth Rinse BID   metoprolol tartrate  50 mg Oral BID   multivitamin with minerals  1 tablet Oral Daily   oxyCODONE  15 mg Oral Q12H   pantoprazole  40 mg Oral Daily   polyethylene glycol  17 g Oral Daily   sodium chloride flush  10-40 mL Intracatheter Q12H   sodium chloride flush  5 mL Intracatheter Q8H   Continuous Infusions:  sodium chloride 250 mL (03/07/19 2139)    ceFAZolin (ANCEF) IV 2 g (03/22/19 0535)   sodium chloride       LOS: 28 days     Alwyn RenElizabeth G Avory Rahimi, MD [facility] If 7PM-7AM, please contact night-coverage [email or web address] Password [email or web address] 03/22/2019, 11:33 AM

## 2019-03-22 NOTE — TOC Progression Note (Signed)
Transition of Care [facility]) - Progression Note    Patient Details  Name: DAMACIO WEISGERBER MRN: [id number] Date of Birth: Apr 30, 1970  Transition of Care [facility]) CM/SW [place], [place] Phone Number: 03/22/2019, 9:29 AM  Clinical Narrative:   Checked in with Mr Thieme today.  He has a good appetite and his mood is also good.  I explained the process that I am involved in for locating rehab for him, how it is paid for, who has expressed interest, etc.  He had many questions, all relevant.  I called Ebony Hail at [facility] and reminded her that we are waiting to move him along.  She will reach out to leadership and get back to me with an update today. TOC will continue to follow during the course of hospitalization.     Expected Discharge Plan: Home/Self Care Barriers to Discharge: No Barriers Identified  Expected Discharge Plan and Services Expected Discharge Plan: Home/Self Care In-house Referral: Clinical Social Work     Living arrangements for the past 2 months: Single Family Home                                       Social Determinants of Health (SDOH) Interventions    Readmission Risk Interventions No flowsheet data found.

## 2019-03-23 LAB — GLUCOSE, CAPILLARY
Glucose-Capillary: 94 mg/dL (ref 70–99)
Glucose-Capillary: 100 mg/dL — ABNORMAL HIGH (ref 70–99)
Glucose-Capillary: 109 mg/dL — ABNORMAL HIGH (ref 70–99)
Glucose-Capillary: 143 mg/dL — ABNORMAL HIGH (ref 70–99)

## 2019-03-23 NOTE — Progress Notes (Signed)
PROGRESS NOTE    Franklin Anderson  UEA:[id number] DOB: 06/08/1970 DOA: 02/21/2019 PCP: Mack Hook, MD  Brief Narrative:48 y.o.malewith medical history significant of rheumatic fever, HTN, ongoingIV heroin abuse, presented to the ED on 10/27 with 3 week course of progressively worsening back pain and BLE weakness associated with some bowel incontinence and trouble walking. Last use of heroin was on the day of admission. He was found to be septic with MSSA bacteremia, endocarditis, osteomyelitis, discitis at L4 and L5, septic pulmonary emboli with loculated effusion and pneumothorax and psoas abscesses. On 10/28 was found to be obtunded and dyspneic and was intubated by critical care, extubated on 10/31. Right psoas abscess was aspirated on 10/28, wound care >MSSA. Respiratory culture from 10/28 also grew MSSA.  Patient had acute respiratory distress on 02/28/2019 was intubated. He had chest tube placement for loculated pneumothorax/CT of the chest indicating empyema pulmonary embolism hydropneumothorax  Assessment & Plan:   Principal Problem:   Sepsis ([place]) Active Problems:   Dyspnea   Heroin use   Endocarditis   Septic pulmonary embolism (HCC)   Osteomyelitis of thoracic spine (HCC)   Osteomyelitis of lumbar spine (HCC)   Acute encephalopathy   AKI (acute kidney injury) ([place])   Transaminitis   Psoas abscess (HCC)   Pressure injury of skin   Mucus clot in bronchi   Protein-calorie malnutrition, severe   Abnormal echocardiogram   MSSA bacteremia   Acute pulmonary embolism (HCC)   Pleural effusion on left   Empyema, left (HCC)   Chest tube in place   Pain   Pneumothorax on left   Fistula   Epidural abscess   #1 MSSA bacteremia with discitis and vertebral osteomyelitis and psoas abscess with septic pulmonary embolism/cavitary lesion andright ventricularvegetation- CT with persistent loculated left hydropneumothorax with decreased pneumothorax. Will septic  emboli throughout both lungs remain unchanged. CT surgery thinks he is not a good candidate for thoracotomy and muscle flap closure. Followed by ID recommending 6 weeks of Ancef in the hospital and then discharged home on Keflex for another 30 days. Ancef was started 02/24/2019. He is status post chest tube placement for bronchopleural fistula. Paraspinal abscess fluid culture 03/15/2019 abend WBC predominantly polymorphonuclear leukocytes no organisms or no growth. I have stopped IV Dilaudid. Increase p.o. Dilaudid to 2 mg every 4 as needed. Follow-up labs in a.m. Repeat CT 11/23--The right paraspinous scratched at the larger of the 2 right paraspinous fluid collections is drained. The smaller, more superior posterior collection remains, measuring up to 12 mm. Stable lytic endplate changes associated with disc osteomyelitis at L4-5. Collapse is worse on the right, stable from prior exam. Subtle endplate changes posteriorly at L2-3 and at the right facet of L5-S1 without progression.  #2 back pain secondary to bilateral psoas abscess with progression of discitis and osteomyelitis with nerve compression and worsening of stenosis. Patient had CT-guided right psoas aspiration by IR 02/22/2019 yielding 18 mL of purulent fluid with MSSA. Patient has recurrent psoas abscess.He is status post CT-guided placement of drainage catheter to the recurrent right paraspinal abscess yielded 15 cc of purulent material 03/16/2019.He had a drain output of 15 cc yesterday including the flush. Today with very minimal drainage in the bulb.   #3 acute hypoxic respiratory failure due to multiple pulmonary embolism and sepsis patient intubated 02/22/2019 and extubated 02/25/2019.Patient has loculated effusion with pneumothorax and bronchopleural fistula he is status post chest tube placement which is out now. Chest x-rayreviewed by me 03/17/2019 shows stable mild  left pneumothorax stable bilateral  nodular densities consistent with septic emboli.  #4 pericardial effusion noted on the echo none on repeat CT scan.  #5 acute metabolic encephalopathy resolved.  #6 AKI resolved.  #7 hypernatremia resolved.  #8 hypertension blood pressure138/99The current medical regimen is effective; continue present plan and medications. Norvasc 5 mg daily with metoprolol.   #9 pressure injury with stage I continue wound care  #10 severe protein calorie malnutrition severe albuminemia dietary consulted and is following. Recommending Ensure Enlive p.o. twice daily, prostat liquid protein 3 times daily with meals, Magic cup 3 times daily with meals and multivitamins with mineral Pressure Injury 02/22/19 Sacrum Mid Stage I -  Intact skin with non-blanchable redness of a localized area usually over a bony prominence. (Active)  02/22/19 0420  Location: Sacrum  Location Orientation: Mid  Staging: Stage I -  Intact skin with non-blanchable redness of a localized area usually over a bony prominence.  Wound Description (Comments):   Present on Admission: Yes     Pressure Injury 03/13/19 Hip Right Deep Tissue Injury - Purple or maroon localized area of discolored intact skin or blood-filled blister due to damage of underlying soft tissue from pressure and/or shear. purple area, skin intact (Active)  03/13/19 0800  Location: Hip  Location Orientation: Right  Staging: Deep Tissue Injury - Purple or maroon localized area of discolored intact skin or blood-filled blister due to damage of underlying soft tissue from pressure and/or shear.  Wound Description (Comments): purple area, skin intact  Present on Admission: No      Nutrition Problem: Severe Malnutrition Etiology: social / environmental circumstances([facility])     Signs/Symptoms: severe fat depletion, severe muscle depletion    Interventions: Ensure Enlive (each supplement provides 350kcal and 20 grams of protein), MVI  Estimated  body mass index is 14.92 kg/m as calculated from the following:   Height as of this encounter: 6' 0.01" (1.829 m).   Weight as of this encounter: 49.9 kg.  DVT prophylaxisHeparin  code Status:Full code  family Communication:None Disposition Plan:Pending clinical improvement patient will need SNF upon discharge  consultants:  PCCM, cardiology, ID, palliative care, cardiothoracic surgery  Procedures:CT-guided drainage of right psoas abscess 02/22/2019 Antimicrobials:Ancef started 02/24/2019  Subjective: Planes that he was not able to work with physical therapy yesterday due to excruciating pain on his back where the drain is.  He says Dilaudid 2 mg p.o. is not getting the pain for him to move around.  As long as he stays in bed without moving Dilaudid 2 mg every 4 is fine per patient.  Objective: Vitals:   03/22/19 2108 03/23/19 0630 03/23/19 0857 03/23/19 0858  BP: 130/83 121/87 133/78 133/78  Pulse: 93 88 (!) 102   Resp: 18 18    Temp: 98.5 F (36.9 C) 98.1 F (36.7 C)    TempSrc: Oral Oral    SpO2: 100% 99%    Weight:      Height:        Intake/Output Summary (Last 24 hours) at 03/23/2019 1108 Last data filed at 03/23/2019 1022 Gross per 24 hour  Intake 1090 ml  Output 2805 ml  Net -1715 ml   Filed Weights   03/14/19 0500 03/15/19 0522 03/15/19 2000  Weight: 49.8 kg 49.9 kg 49.9 kg    Examination:  General exam: Appears calm and comfortable  Respiratory system: Clear to auscultation. Respiratory effort normal. Cardiovascular system: S1 & S2 heard, RRR. No JVD, murmurs, rubs, gallops or clicks. No pedal edema.  Gastrointestinal system: Abdomen is nondistended, soft and nontender. No organomegaly or masses felt. Normal bowel sounds heard.  Drain still draining serosanguineous. Central nervous system: Alert and oriented. No focal neurological deficits. Extremities: Symmetric 5 x 5 power. Skin: No rashes, lesions or ulcers Psychiatry: Judgement and insight  appear normal. Mood & affect appropriate.     Data Reviewed: I have personally reviewed following labs and imaging studies  CBC: Recent Labs  Lab 03/19/19 1103  WBC 7.9  HGB 8.7*  HCT 27.7*  MCV 93.0  PLT 607*   Basic Metabolic Panel: Recent Labs  Lab 03/19/19 1103  NA 134*  K 3.8  CL 97*  CO2 28  GLUCOSE 106*  BUN 14  CREATININE 0.48*  CALCIUM 8.4*   GFR: Estimated Creatinine Clearance: 78.8 mL/min (A) (by C-G formula based on SCr of 0.48 mg/dL (L)). Liver Function Tests: Recent Labs  Lab 03/19/19 1103  AST 22  ALT 17  ALKPHOS 91  BILITOT 0.7  PROT 6.8  ALBUMIN 2.1*   No results for input(s): LIPASE, AMYLASE in the last 168 hours. No results for input(s): AMMONIA in the last 168 hours. Coagulation Profile: No results for input(s): INR, PROTIME in the last 168 hours. Cardiac Enzymes: No results for input(s): CKTOTAL, CKMB, CKMBINDEX, TROPONINI in the last 168 hours. BNP (last 3 results) No results for input(s): PROBNP in the last 8760 hours. HbA1C: No results for input(s): HGBA1C in the last 72 hours. CBG: Recent Labs  Lab 03/22/19 0758 03/22/19 1200 03/22/19 1713 03/22/19 2117 03/23/19 0754  GLUCAP 104* 136* 152* 117* 94   Lipid Profile: No results for input(s): CHOL, HDL, LDLCALC, TRIG, CHOLHDL, LDLDIRECT in the last 72 hours. Thyroid Function Tests: No results for input(s): TSH, T4TOTAL, FREET4, T3FREE, THYROIDAB in the last 72 hours. Anemia Panel: No results for input(s): VITAMINB12, FOLATE, FERRITIN, TIBC, IRON, RETICCTPCT in the last 72 hours. Sepsis Labs: No results for input(s): PROCALCITON, LATICACIDVEN in the last 168 hours.  Recent Results (from the past 240 hour(s))  Aerobic/Anaerobic Culture (surgical/deep wound)     Status: None   Collection Time: 03/15/19  4:38 PM   Specimen: Abscess  Result Value Ref Range Status   Specimen Description   Final    ABSCESS PARASPINAL Performed at [facility], [address],  [place], [place] [address]    Special Requests   Final    Normal Performed at [facility], [address], [place], [place] [address]    Gram Stain   Final    ABUNDANT WBC PRESENT, PREDOMINANTLY PMN NO ORGANISMS SEEN    Culture   Final    No growth aerobically or anaerobically. Performed at [facility], [address], [place], [place] [address]    Report Status 03/20/2019 FINAL  Final         Radiology Studies: No results found.      Scheduled Meds:  amLODipine  5 mg Oral Daily   Chlorhexidine Gluconate Cloth  6 each Topical Daily   feeding supplement (ENSURE ENLIVE)  237 mL Oral BID BM   feeding supplement (PRO-STAT SUGAR FREE 64)  30 mL Oral TID   insulin aspart  0-24 Units Subcutaneous TID AC & HS   lidocaine  1 patch Transdermal Q24H   mouth rinse  15 mL Mouth Rinse BID   metoprolol tartrate  50 mg Oral BID   multivitamin with minerals  1 tablet Oral Daily   oxyCODONE  15 mg Oral  Q12H   pantoprazole  40 mg Oral Daily   polyethylene glycol  17 g Oral Daily   sodium chloride flush  10-40 mL Intracatheter Q12H   sodium chloride flush  5 mL Intracatheter Q8H   Continuous Infusions:  sodium chloride 250 mL (03/07/19 2139)    ceFAZolin (ANCEF) IV 2 g (03/23/19 1610)   sodium chloride       LOS: 29 days     Alwyn Ren, MD [facility] If 7PM-7AM, please contact night-coverage [email or web address] Password [email or web address] 03/23/2019, 11:08 AM

## 2019-03-24 LAB — GLUCOSE, CAPILLARY
Glucose-Capillary: 120 mg/dL — ABNORMAL HIGH (ref 70–99)
Glucose-Capillary: 135 mg/dL — ABNORMAL HIGH (ref 70–99)
Glucose-Capillary: 138 mg/dL — ABNORMAL HIGH (ref 70–99)
Glucose-Capillary: 138 mg/dL — ABNORMAL HIGH (ref 70–99)

## 2019-03-24 MED ORDER — CYCLOBENZAPRINE HCL 5 MG PO TABS
5.0000 mg | ORAL_TABLET | Freq: Three times a day (TID) | ORAL | Status: DC
  Administered 2019-03-24 – 2019-03-29 (×16): 5 mg via ORAL
  Filled 2019-03-24 (×16): qty 1

## 2019-03-24 MED ORDER — FENTANYL 25 MCG/HR TD PT72
1.0000 | MEDICATED_PATCH | TRANSDERMAL | Status: DC
  Administered 2019-03-24 – 2019-04-02 (×4): 1 via TRANSDERMAL
  Filled 2019-03-24 (×4): qty 1

## 2019-03-24 NOTE — Progress Notes (Signed)
Patient reports back pain is 10/10. States he can't tell Fentanyl patch is on. Clonazepam 0.5 mg, Hydromorphone 2 mg, and Cyclobenzaprine 5 mg given PO as ordered. Call bell within reach. Will continue to monitor.

## 2019-03-24 NOTE — Progress Notes (Signed)
Physical Therapy Treatment Patient Details Name: Franklin Anderson MRN: [id number] DOB: [birth year] Today's Date: 03/24/2019    History of Present Illness Pt is 48 y/o M with PMH: IVDA (heroine), who presented with worsening back pain and B LE weakness. 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 Pt was intubated 10/28, extubated 10/31.    PT Comments    Patient required assistance to transfer from [facility] to bed. Despite timing therapy arrival to after pt had pain medication and patch earlier pt c/o pain limiting his mobility. Therapist encouraged pt to achieve upright posture to work on stepping to move back to bed and pt strongly declined. He strongly declined participating in seated or supine bed exercises as well. Patient will continue to benefit from skilled PT interventions to address impairments and progress mobility as able.   Follow Up Recommendations  SNF     Equipment Recommendations  Other (comment)(TBD)    Recommendations for Other Services       Precautions / Restrictions Precautions Precautions: Fall Precaution Comments: JP drain Lt low back Restrictions Weight Bearing Restrictions: No    Mobility  Bed Mobility                  Transfers Overall transfer level: Needs assistance   Transfers: Stand Pivot Transfers   Stand pivot transfers: Min assist;+2 safety/equipment       General transfer comment: pt on BSC at start of session and requesting assistance to get up. pt greatly limited by pain and required assistance and cues for hand placement on BSC to initiate power up. Therapist encouraged pt to use RW however pt strongly declined. 1 HHA and min assist to steady provided to perform stand and pt unable to acheive full upright posture. Pt reaching for bed and sliding arm to rail to pivot and sit EOB despite cues to stand upright. Upon sitting pt stated he would no stand again and declined to  perform seated or supine exercises for LE's.  Ambulation/Gait                 Stairs             Wheelchair Mobility    Modified Rankin (Stroke Patients Only)       Balance Overall balance assessment: Needs assistance Sitting-balance support: Bilateral upper extremity supported;Feet supported Sitting balance-Leahy Scale: Fair     Standing balance support: Bilateral upper extremity supported;During functional activity Standing balance-Leahy Scale: Fair         Cognition Arousal/Alertness: Awake/alert Behavior During Therapy: WFL for tasks assessed/performed       Exercises      General Comments        Pertinent Vitals/Pain Pain Assessment: Faces Faces Pain Scale: Hurts worst Pain Location: back with mobility Pain Descriptors / Indicators: Guarding;Grimacing;Moaning;Sharp Pain Intervention(s): Limited activity within patient's tolerance           PT Goals (current goals can now be found in the care plan section) Acute Rehab PT Goals Patient Stated Goal: To get stronger and make better decisions PT Goal Formulation: With patient Time For Goal Achievement: 03/29/19(date extended) Potential to Achieve Goals: Fair Progress towards PT goals: Not progressing toward goals - comment(pt is very self-limiting with mobility; pt complaining of pain limiting his mobility)    Frequency    Min 2X/week      PT Plan Current plan remains appropriate       AM-PAC PT "6 Clicks" Mobility  Outcome Measure  Help needed turning from your back to your side while in a flat bed without using bedrails?: A Little Help needed moving from lying on your back to sitting on the side of a flat bed without using bedrails?: A Little Help needed moving to and from a bed to a chair (including a wheelchair)?: A Lot Help needed standing up from a chair using your arms (e.g., wheelchair or bedside chair)?: A Lot Help needed to walk in hospital room?: A Lot Help needed  climbing 3-5 steps with a railing? : Total 6 Click Score: 13    End of Session Equipment Utilized During Treatment: Gait belt Activity Tolerance: Patient limited by pain;Other (comment)(self-limits) Patient left: with call bell/phone within reach;in bed;with nursing/sitter in room(pt up in chair without chair alarm upon PT arrival) Nurse Communication: Mobility status PT Visit Diagnosis: Muscle weakness (generalized) (M62.81);Difficulty in walking, not elsewhere classified (R26.2)     Time: 1610-9604 PT Time Calculation (min) (ACUTE ONLY): 6 min  Charges:                        Kipp Brood, PT, DPT Physical Therapist with [facility]  03/24/2019 1:50 PM

## 2019-03-24 NOTE — Progress Notes (Signed)
OT Cancellation Note  Patient Details Name: Franklin Anderson MRN: [id number] DOB: 22-Sep-1970   Cancelled Treatment:    Reason Eval/Treat Not Completed: Patient declined, no reason specified.  Checked back this pm, and pt declines OT. Will reattempt next week  Rishav Rockefeller 03/24/2019, 3:29 PM  Lesle Chris, OTR/L Acute Rehabilitation Services [phone] [facility] pager [phone] office 03/24/2019

## 2019-03-24 NOTE — Progress Notes (Signed)
    Referring Physician(s): Amin,S  Supervising Physician: Sandi Mariscal  Patient Status:  [facility] - In-pt  Chief Complaint:  Back pain/abscess  Subjective: Pt without acute changes; has persistent back pain   Allergies: Penicillins  Medications: Prior to Admission medications   Medication Sig Start Date End Date Taking? Authorizing Provider  Multiple Vitamin (MULTIVITAMIN WITH MINERALS) TABS tablet Take 1 tablet by mouth daily.   Yes [provider]  lisinopril (ZESTRIL) 10 MG tablet Take 1 tablet by mouth daily Patient not taking: Reported on 02/21/2019 01/20/19   Mack Hook, MD     Vital Signs: BP (!) 123/91 (BP Location: Right Arm)   Pulse 100   Temp 99 F (37.2 C) (Oral)   Resp 16   Ht 6' 0.01" (1.829 m)   Wt 110 lb 0.2 oz (49.9 kg)   SpO2 98%   BMI 14.92 kg/m   Physical Exam awake/alert; rt flank drain intact, insertion site ok,  OP minimal over past 48 hrs  Imaging: No results found.  Labs:  CBC: Recent Labs    03/11/19 1124 03/13/19 0936 03/15/19 0545 03/19/19 1103  WBC 8.4 7.7 6.8 7.9  HGB 8.5* 8.0* 8.8* 8.7*  HCT 26.9* 26.1* 28.8* 27.7*  PLT 419* 434* 548* 607*    COAGS: Recent Labs    02/21/19 2013 02/22/19 1850 02/28/19 2331 03/15/19 0545  INR 1.4* 1.7* 1.4* 1.2  APTT 24 28 30   --     BMP: Recent Labs    03/11/19 1124 03/13/19 0936 03/15/19 0545 03/19/19 1103  NA 134* 133* 133* 134*  K 4.8 4.1 4.3 3.8  CL 97* 96* 97* 97*  CO2 29 30 27 28   GLUCOSE 108* 122* 99 106*  BUN 14 14 12 14   CALCIUM 8.0* 8.2* 8.4* 8.4*  CREATININE 0.38* 0.38* 0.36* 0.48*  GFRNONAA >60 >60 >60 >60  GFRAA >60 >60 >60 >60    LIVER FUNCTION TESTS: Recent Labs    02/25/19 0440 03/08/19 0610 03/15/19 0545 03/19/19 1103  BILITOT 0.6 0.4 0.2* 0.7  AST 23 73* 25 22  ALT 28 42 21 17  ALKPHOS 81 92 99 91  PROT 6.4* 6.5 6.8 6.8  ALBUMIN 1.4* 1.7* 2.0* 2.1*    Assessment and Plan: Patient with history of intravenous drug abuse  and recurrent right-sided paraspinal abscess;status post drain placement on 03/15/2019; temp 99; no new labs; drain fluid cx neg; drain OP minimal over past 48 hrs; latest CT /case reviewed again with Dr. Pascal Lux and decision made to remove drain; rt psoas drain removed in its entirety without immediate complications. Gauze dressing applied to site.    Electronically Signed: D. Rowe Robert, PA-C 03/24/2019, 3:16 PM   I spent a total of 15 minutes at the the patient's bedside AND on the patient's hospital floor or unit, greater than 50% of which was counseling/coordinating care for right psoas abscess drain    Patient ID: Franklin Anderson, male   DOB: 1970/06/06, 48 y.o.   MRN: [id number]

## 2019-03-24 NOTE — Progress Notes (Signed)
OT Cancellation Note  Patient Details Name: Franklin Anderson MRN: [id number] DOB: 1970-09-12   Cancelled Treatment:    Reason Eval/Treat Not Completed: Pain limiting ability to participate. Pt states that pain meds are not touching his pain and that they plan to change them.  If schedule permits, I'll try to check back later.  If not, we will check back asap  [facility] 03/24/2019, 8:56 AM  Lesle Chris, OTR/L Acute Rehabilitation Services [phone] [facility] pager [phone] office 03/24/2019

## 2019-03-24 NOTE — Progress Notes (Signed)
PROGRESS NOTE    Franklin Anderson  ZOX:[id number] DOB: 11-24-1970 DOA: 02/21/2019 PCP: Julieanne MansonMulberry, Elizabeth, MD  Brief Narrative:47 y.o.malewith medical history significant of rheumatic fever, HTN, ongoingIV heroin abuse, presented to the ED on 10/27 with 3 week course of progressively worsening back pain and BLE weakness associated with some bowel incontinence and trouble walking. Last use of heroin was on the day of admission. He was found to be septic with MSSA bacteremia, endocarditis, osteomyelitis, discitis at L4 and L5, septic pulmonary emboli with loculated effusion and pneumothorax and psoas abscesses. On 10/28 was found to be obtunded and dyspneic and was intubated by critical care, extubated on 10/31. Right psoas abscess was aspirated on 10/28, wound care >MSSA. Respiratory culture from 10/28 also grew MSSA.  Patient had acute respiratory distress on 02/28/2019 was intubated. He had chest tube placement for loculated pneumothorax/CT of the chest indicating empyema pulmonary embolism hydropneumothorax  Assessment & Plan:   Principal Problem:   Sepsis (HCC) Active Problems:   Dyspnea   Heroin use   Endocarditis   Septic pulmonary embolism (HCC)   Osteomyelitis of thoracic spine (HCC)   Osteomyelitis of lumbar spine (HCC)   Acute encephalopathy   AKI (acute kidney injury) (HCC)   Transaminitis   Psoas abscess (HCC)   Pressure injury of skin   Mucus clot in bronchi   Protein-calorie malnutrition, severe   Abnormal echocardiogram   MSSA bacteremia   Acute pulmonary embolism (HCC)   Pleural effusion on left   Empyema, left (HCC)   Chest tube in place   Pain   Pneumothorax on left   Fistula   Epidural abscess  #1 MSSA bacteremia with discitis and vertebral osteomyelitis and psoas abscess with septic pulmonary embolism/cavitary lesion andright ventricularvegetation- CT with persistent loculated left hydropneumothorax with decreased pneumothorax. Will septic  emboli throughout both lungs remain unchanged. CT surgery thinks he is not a good candidate for thoracotomy and muscle flap closure. Followed by ID recommending 6 weeks of Ancef in the hospital and then discharged home on Keflex for another 30 days. Ancef was started 02/24/2019. He is status post chest tube placement for bronchopleural fistula. Paraspinal abscess fluid culture 03/15/2019 abend WBC predominantly polymorphonuclear leukocytes no organisms or no growth. He continues to complain of pain.  He was initially on Dilaudid IV.  This was stopped he was started on Dilaudid 1 mg every 4 as needed.  He continued to complain of pain Dilaudid was increased to 2 mg 4 times a day as needed.  He still is main complaint is pain he is not able to participate in therapy due to pain. I will add fentanyl patch to Dilaudid. I would be hesitant to restart his IV Dilaudid.  Repeat CT 11/23--The right paraspinous scratched at the larger of the 2 right paraspinous fluid collections is drained. The smaller, more superior posterior collection remains, measuring up to 12 mm. Stable lytic endplate changes associated with disc osteomyelitis at L4-5. Collapse is worse on the right, stable from prior exam. Subtle endplate changes posteriorly at L2-3 and at the right facet of L5-S1 without progression.  IR following.  He has drain in place in the right paraspinal area.  #2 back pain secondary to bilateral psoas abscess with progression of discitis and osteomyelitis with nerve compression and worsening of stenosis. Patient had CT-guided right psoas aspiration by IR 02/22/2019 yielding 18 mL of purulent fluid with MSSA. Patient has recurrent psoas abscess.He is status post CT-guided placement of drainage catheter to the  recurrent right paraspinal abscess yielded 15 cc of purulent material 03/16/2019. He is status post drain in the right paraspinal space with ongoing serosanguineous drainage.  He will need to  follow-up with IR upon discharge.  If there is a facility that is willing to take him with the drain in place he can be discharged he needs to for just follow-up with interventional radiology to get the drain out.    #3 acute hypoxic respiratory failure due to multiple pulmonary embolism and sepsis patient intubated 02/22/2019 and extubated 02/25/2019.Patient has loculated effusion with pneumothorax and bronchopleural fistula he is status post chest tube placement which is out now. Chest x-rayreviewed by me 03/17/2019 shows stable mild left pneumothorax stable bilateral nodular densities consistent with septic emboli.  #4 pericardial effusion noted on the echo none on repeat CT scan.  #5 acute metabolic encephalopathy resolved.  #6 AKI resolved.  #7 hypernatremia resolved.  #8 hypertension blood pressure125/92the current medical regimen is effective; continue present plan and medications. Norvasc 5 mg daily with metoprolol.   #9 pressure injury with stage I continue wound care  #10 severe protein calorie malnutrition severe albuminemia dietary consulted and is following. Recommending Ensure Enlive p.o. twice daily, prostat liquid protein 3 times daily with meals, Magic cup 3 times daily with meals and multivitamins with minerals   Pressure Injury 02/22/19 Sacrum Mid Stage I -  Intact skin with non-blanchable redness of a localized area usually over a bony prominence. (Active)  02/22/19 0420  Location: Sacrum  Location Orientation: Mid  Staging: Stage I -  Intact skin with non-blanchable redness of a localized area usually over a bony prominence.  Wound Description (Comments):   Present on Admission: Yes     Pressure Injury 03/13/19 Hip Right Deep Tissue Injury - Purple or maroon localized area of discolored intact skin or blood-filled blister due to damage of underlying soft tissue from pressure and/or shear. purple area, skin intact (Active)  03/13/19 0800  Location: Hip   Location Orientation: Right  Staging: Deep Tissue Injury - Purple or maroon localized area of discolored intact skin or blood-filled blister due to damage of underlying soft tissue from pressure and/or shear.  Wound Description (Comments): purple area, skin intact  Present on Admission: No      Nutrition Problem: Severe Malnutrition Etiology: social / environmental circumstances(IVDU)     Signs/Symptoms: severe fat depletion, severe muscle depletion    Interventions: Ensure Enlive (each supplement provides 350kcal and 20 grams of protein), MVI  Estimated body mass index is 14.92 kg/m as calculated from the following:   Height as of this encounter: 6' 0.01" (1.829 m).   Weight as of this encounter: 49.9 kg.  DVT prophylaxisHeparin  code Status:Full code  family Communication:None Disposition Plan:Pending clinical improvement patient will need SNF upon discharge .  He will need a Covid test ordered for SNF placement when he is ready to go to SNF or when they have a place for him to go. consultants:  PCCM, cardiology, ID, palliative care, cardiothoracic surgery  Procedures:CT-guided drainage of right psoas abscess 02/22/2019 Antimicrobials:Ancef started 02/24/2019  Subjective: He is resting in bed he does not appear to be in any distress however he says when he moves he is in a lot of pain that is inhibiting his progress.  Denies nausea vomiting diarrhea fever chills or cough.  Objective: Vitals:   03/23/19 0858 03/23/19 1418 03/23/19 2138 03/24/19 0524  BP: 133/78 130/90 (!) 137/94 (!) 125/92  Pulse:  99 99 88  Resp:  18 18 18   Temp:  98.2 F (36.8 C) 98.8 F (37.1 C) 97.7 F (36.5 C)  TempSrc:  Oral Oral Oral  SpO2:  98% 98% 97%  Weight:      Height:        Intake/Output Summary (Last 24 hours) at 03/24/2019 1015 Last data filed at 03/24/2019 0720 Gross per 24 hour  Intake 1564.67 ml  Output 3075 ml  Net -1510.33 ml   Filed Weights   03/14/19  0500 03/15/19 0522 03/15/19 2000  Weight: 49.8 kg 49.9 kg 49.9 kg    Examination:  General exam: Appears calm and comfortable  Respiratory system: Clear to auscultation. Respiratory effort normal. Cardiovascular system: S1 & S2 heard, RRR. No JVD, murmurs, rubs, gallops or clicks. No pedal edema. Gastrointestinal system: Abdomen is nondistended, soft and nontender. No organomegaly or masses felt. Normal bowel sounds heard.  Drain in place in the right paraspinal area with serosanguineous drainage. Central nervous system: Alert and oriented. No focal neurological deficits. Extremities: Symmetric 5 x 5 power. Skin: No rashes, lesions or ulcers Psychiatry: Judgement and insight appear normal. Mood & affect appropriate.     Data Reviewed: I have personally reviewed following labs and imaging studies  CBC: Recent Labs  Lab 03/19/19 1103  WBC 7.9  HGB 8.7*  HCT 27.7*  MCV 93.0  PLT 710*   Basic Metabolic Panel: Recent Labs  Lab 03/19/19 1103  NA 134*  K 3.8  CL 97*  CO2 28  GLUCOSE 106*  BUN 14  CREATININE 0.48*  CALCIUM 8.4*   GFR: Estimated Creatinine Clearance: 78.8 mL/min (A) (by C-G formula based on SCr of 0.48 mg/dL (L)). Liver Function Tests: Recent Labs  Lab 03/19/19 1103  AST 22  ALT 17  ALKPHOS 91  BILITOT 0.7  PROT 6.8  ALBUMIN 2.1*   No results for input(s): LIPASE, AMYLASE in the last 168 hours. No results for input(s): AMMONIA in the last 168 hours. Coagulation Profile: No results for input(s): INR, PROTIME in the last 168 hours. Cardiac Enzymes: No results for input(s): CKTOTAL, CKMB, CKMBINDEX, TROPONINI in the last 168 hours. BNP (last 3 results) No results for input(s): PROBNP in the last 8760 hours. HbA1C: No results for input(s): HGBA1C in the last 72 hours. CBG: Recent Labs  Lab 03/23/19 0754 03/23/19 1142 03/23/19 1641 03/23/19 2140 03/24/19 0750  GLUCAP 94 100* 109* 143* 120*   Lipid Profile: No results for input(s): CHOL,  HDL, LDLCALC, TRIG, CHOLHDL, LDLDIRECT in the last 72 hours. Thyroid Function Tests: No results for input(s): TSH, T4TOTAL, FREET4, T3FREE, THYROIDAB in the last 72 hours. Anemia Panel: No results for input(s): VITAMINB12, FOLATE, FERRITIN, TIBC, IRON, RETICCTPCT in the last 72 hours. Sepsis Labs: No results for input(s): PROCALCITON, LATICACIDVEN in the last 168 hours.  Recent Results (from the past 240 hour(s))  Aerobic/Anaerobic Culture (surgical/deep wound)     Status: None   Collection Time: 03/15/19  4:38 PM   Specimen: Abscess  Result Value Ref Range Status   Specimen Description   Final    ABSCESS PARASPINAL Performed at [facility], [address], [place], [place] [address]    Special Requests   Final    Normal Performed at [facility] [address] [place], [place] [address]    Gram Stain   Final    ABUNDANT WBC PRESENT, PREDOMINANTLY PMN NO ORGANISMS SEEN    Culture   Final    No growth aerobically or  anaerobically. Performed at [facility], [address] [place], [place] [address]    Report Status 03/20/2019 FINAL  Final         Radiology Studies: No results found.      Scheduled Meds: . amLODipine  5 mg Oral Daily  . Chlorhexidine Gluconate Cloth  6 each Topical Daily  . feeding supplement (ENSURE ENLIVE)  237 mL Oral BID BM  . feeding supplement (PRO-STAT SUGAR FREE 64)  30 mL Oral TID  . fentaNYL  1 patch Transdermal Q72H  . insulin aspart  0-24 Units Subcutaneous TID AC & HS  . lidocaine  1 patch Transdermal Q24H  . mouth rinse  15 mL Mouth Rinse BID  . metoprolol tartrate  50 mg Oral BID  . multivitamin with minerals  1 tablet Oral Daily  . pantoprazole  40 mg Oral Daily  . polyethylene glycol  17 g Oral Daily  . sodium chloride flush  10-40 mL Intracatheter Q12H  . sodium chloride flush  5 mL Intracatheter Q8H   Continuous Infusions: . sodium chloride 250 mL (03/07/19 2139)  .  ceFAZolin (ANCEF) IV  2 g (03/24/19 0523)  . sodium chloride       LOS: 30 days      Alwyn Ren, MD [facility]  If 7PM-7AM, please contact night-coverage [email or web address] Password [email or web address] 03/24/2019, 10:15 AM

## 2019-03-25 LAB — GLUCOSE, CAPILLARY
Glucose-Capillary: 104 mg/dL — ABNORMAL HIGH (ref 70–99)
Glucose-Capillary: 145 mg/dL — ABNORMAL HIGH (ref 70–99)
Glucose-Capillary: 110 mg/dL — ABNORMAL HIGH (ref 70–99)
Glucose-Capillary: 122 mg/dL — ABNORMAL HIGH (ref 70–99)

## 2019-03-25 MED ORDER — HYDROMORPHONE HCL 1 MG/ML IJ SOLN
2.0000 mg | INTRAMUSCULAR | Status: AC
  Administered 2019-03-25: 2 mg via INTRAVENOUS
  Filled 2019-03-25: qty 2

## 2019-03-25 MED ORDER — METOPROLOL TARTRATE 50 MG PO TABS
100.0000 mg | ORAL_TABLET | Freq: Two times a day (BID) | ORAL | Status: DC
  Administered 2019-03-25 – 2019-04-14 (×40): 100 mg via ORAL
  Filled 2019-03-25 (×5): qty 2
  Filled 2019-03-25 (×2): qty 4
  Filled 2019-03-25 (×11): qty 2
  Filled 2019-03-25: qty 4
  Filled 2019-03-25 (×6): qty 2
  Filled 2019-03-25: qty 4
  Filled 2019-03-25: qty 2
  Filled 2019-03-25: qty 4
  Filled 2019-03-25 (×6): qty 2
  Filled 2019-03-25: qty 8
  Filled 2019-03-25 (×2): qty 2
  Filled 2019-03-25: qty 4
  Filled 2019-03-25 (×2): qty 2
  Filled 2019-03-25: qty 4

## 2019-03-25 NOTE — Progress Notes (Signed)
PROGRESS NOTE    Franklin Anderson  NFA:[id number] DOB: 17-May-1970 DOA: 02/21/2019 PCP: Julieanne Manson, MD  Brief Narrative:48 y.o.malewith medical history significant of rheumatic fever, HTN, ongoingIV heroin abuse, presented to the ED on 10/27 with 3 week course of progressively worsening back pain and BLE weakness associated with some bowel incontinence and trouble walking. Last use of heroin was on the day of admission. He was found to be septic with MSSA bacteremia, endocarditis, osteomyelitis, discitis at L4 and L5, septic pulmonary emboli with loculated effusion and pneumothorax and psoas abscesses. On 10/28 was found to be obtunded and dyspneic and was intubated by critical care, extubated on 10/31. Right psoas abscess was aspirated on 10/28, wound care >MSSA. Respiratory culture from 10/28 also grew MSSA.  Patient had acute respiratory distress on 02/28/2019 was intubated. He had chest tube placement for loculated pneumothorax/CT of the chest indicating empyema pulmonary embolism hydropneumothorax  Assessment & Plan:   Principal Problem:   Sepsis (HCC) Active Problems:   Dyspnea   Heroin use   Endocarditis   Septic pulmonary embolism (HCC)   Osteomyelitis of thoracic spine (HCC)   Osteomyelitis of lumbar spine (HCC)   Acute encephalopathy   AKI (acute kidney injury) (HCC)   Transaminitis   Psoas abscess (HCC)   Pressure injury of skin   Mucus clot in bronchi   Protein-calorie malnutrition, severe   Abnormal echocardiogram   MSSA bacteremia   Acute pulmonary embolism (HCC)   Pleural effusion on left   Empyema, left (HCC)   Chest tube in place   Pain   Pneumothorax on left   Fistula   Epidural abscess  #1 MSSA bacteremia with discitis and vertebral osteomyelitis and psoas abscess with septic pulmonary embolism/cavitary lesion andright ventricularvegetation- CT with persistent loculated left hydropneumothorax with decreased pneumothorax. Will septic  emboli throughout both lungs remain unchanged. CT surgery thinks he is not a good candidate for thoracotomy and muscle flap closure. Followed by ID recommending 6 weeks of Ancef in the hospital and then discharged home on Keflex for another 30 days. Ancef was started 02/24/2019. He is status post chest tube placement for bronchopleural fistula. Paraspinal abscess fluid culture 03/15/2019 abend WBC predominantly polymorphonuclear leukocytes no organisms or no growth. He continues to complain of pain.  He was initially on Dilaudid IV.  This was stopped he was started on PO Dilaudid 1 mg every 4 as needed.  He continued to complain of pain Dilaudid was increased to 2 mg 4 times a day as needed.  He still is main complaint is pain he is not able to participate in therapy due to pain. I will add fentanyl patch to Dilaudid.continue flexeril. I would be hesitant to restart his IV Dilaudid.  Repeat CT 11/23--The right paraspinous scratched at the larger of the 2 right paraspinous fluid collections is drained. The smaller, more superior posterior collection remains, measuring up to 12 mm. Stable lytic endplate changes associated with disc osteomyelitis at L4-5. Collapse is worse on the right, stable from prior exam. Subtle endplate changes posteriorly at L2-3 and at the right facet of L5-S1 without progression.  #2 back pain secondary to bilateral psoas abscess with progression of discitis and osteomyelitis with nerve compression and worsening of stenosis. Patient had CT-guided right psoas aspiration by IR 02/22/2019 yielding 18 mL of purulent fluid with MSSA. Patient has recurrent psoas abscess.He is status post CT-guided placement of drainage catheter to the recurrent right paraspinal abscess yielded 15 cc of purulent material 03/16/2019.his drain  was taken out by IR on 11/28.   #3 acute hypoxic respiratory failure due to multiple pulmonary embolism and sepsis patient intubated 02/22/2019 and  extubated 02/25/2019.Patient has loculated effusion with pneumothorax and bronchopleural fistula he is status post chest tube placement which is out now. Chest x-rayreviewed by me 03/17/2019 shows stable mild left pneumothorax stable bilateral nodular densities consistent with septic emboli.  #4 pericardial effusion noted on the echo none on repeat CT scan.  #5 acute metabolic encephalopathy resolved.  #6 AKI resolved.  #7 hypernatremia resolved.  #8 hypertension blood pressure139/98 -continue norvasc increase metoprolol.  #9 pressure injury with stage I continue wound care  #10 severe protein calorie malnutrition severe albuminemia dietary consulted and is following. Recommending Ensure Enlive p.o. twice daily, prostat liquid protein 3 times daily with meals, Magic cup 3 times daily with meals and multivitamins with minerals   Pressure Injury 02/22/19 Sacrum Mid Stage I -  Intact skin with non-blanchable redness of a localized area usually over a bony prominence. (Active)  02/22/19 0420  Location: Sacrum  Location Orientation: Mid  Staging: Stage I -  Intact skin with non-blanchable redness of a localized area usually over a bony prominence.  Wound Description (Comments):   Present on Admission: Yes     Pressure Injury 03/13/19 Hip Right Deep Tissue Injury - Purple or maroon localized area of discolored intact skin or blood-filled blister due to damage of underlying soft tissue from pressure and/or shear. purple area, skin intact (Active)  03/13/19 0800  Location: Hip  Location Orientation: Right  Staging: Deep Tissue Injury - Purple or maroon localized area of discolored intact skin or blood-filled blister due to damage of underlying soft tissue from pressure and/or shear.  Wound Description (Comments): purple area, skin intact  Present on Admission: No      Nutrition Problem: Severe Malnutrition Etiology: social / environmental circumstances(IVDU)      Signs/Symptoms: severe fat depletion, severe muscle depletion    Interventions: Ensure Enlive (each supplement provides 350kcal and 20 grams of protein), MVI  Estimated body mass index is 14.92 kg/m as calculated from the following:   Height as of this encounter: 6' 0.01" (1.829 m).   Weight as of this encounter: 49.9 kg.  DVT prophylaxisHeparin  code Status:Full code  family Communication:None Disposition Plan:Pending clinical improvement patient will need SNF upon discharge .  He will need a Covid test ordered for SNF placement when he is ready to go to SNF or when they have a place for him to go. consultants:  PCCM, cardiology, ID, palliative care, cardiothoracic surgery  Procedures:CT-guided drainage of right psoas abscess 02/22/2019 Antimicrobials:Ancef started 02/24/2019  Subjective: He still c/o of severe pain  I told him to give time for the fentanyl patch to work right paraspinal drain removed yesterday Objective: Vitals:   03/24/19 1057 03/24/19 1437 03/24/19 2124 03/25/19 0627  BP: (!) 129/92 (!) 123/91 (!) 136/97 (!) 139/98  Pulse: (!) 106 100 (!) 103 93  Resp:  16 16 20   Temp:  99 F (37.2 C) 98 F (36.7 C) 97.8 F (36.6 C)  TempSrc:  Oral Oral Oral  SpO2:  98% 99% 98%  Weight:      Height:        Intake/Output Summary (Last 24 hours) at 03/25/2019 1243 Last data filed at 03/25/2019 0815 Gross per 24 hour  Intake -  Output 2575 ml  Net -2575 ml   Filed Weights   03/14/19 0500 03/15/19 0522 03/15/19 2000  Weight:  49.8 kg 49.9 kg 49.9 kg    Examination:  General exam: Appears calm and comfortable  Respiratory system: Clear to auscultation. Respiratory effort normal. Cardiovascular system: S1 & S2 heard, RRR. No JVD, murmurs, rubs, gallops or clicks. No pedal edema. Gastrointestinal system: Abdomen is nondistended, soft and nontender. No organomegaly or masses felt. Normal bowel sounds heard.  Drain in place in the right paraspinal area with  serosanguineous drainage. Central nervous system: Alert and oriented. No focal neurological deficits. Extremities: Symmetric 5 x 5 power. Skin: No rashes, lesions or ulcers Psychiatry: Judgement and insight appear normal. Mood & affect appropriate.     Data Reviewed: I have personally reviewed following labs and imaging studies  CBC: Recent Labs  Lab 03/19/19 1103  WBC 7.9  HGB 8.7*  HCT 27.7*  MCV 93.0  PLT 269*   Basic Metabolic Panel: Recent Labs  Lab 03/19/19 1103  NA 134*  K 3.8  CL 97*  CO2 28  GLUCOSE 106*  BUN 14  CREATININE 0.48*  CALCIUM 8.4*   GFR: Estimated Creatinine Clearance: 78.8 mL/min (A) (by C-G formula based on SCr of 0.48 mg/dL (L)). Liver Function Tests: Recent Labs  Lab 03/19/19 1103  AST 22  ALT 17  ALKPHOS 91  BILITOT 0.7  PROT 6.8  ALBUMIN 2.1*   No results for input(s): LIPASE, AMYLASE in the last 168 hours. No results for input(s): AMMONIA in the last 168 hours. Coagulation Profile: No results for input(s): INR, PROTIME in the last 168 hours. Cardiac Enzymes: No results for input(s): CKTOTAL, CKMB, CKMBINDEX, TROPONINI in the last 168 hours. BNP (last 3 results) No results for input(s): PROBNP in the last 8760 hours. HbA1C: No results for input(s): HGBA1C in the last 72 hours. CBG: Recent Labs  Lab 03/24/19 1214 03/24/19 1631 03/24/19 2122 03/25/19 0752 03/25/19 1152  GLUCAP 135* 138* 138* 104* 145*   Lipid Profile: No results for input(s): CHOL, HDL, LDLCALC, TRIG, CHOLHDL, LDLDIRECT in the last 72 hours. Thyroid Function Tests: No results for input(s): TSH, T4TOTAL, FREET4, T3FREE, THYROIDAB in the last 72 hours. Anemia Panel: No results for input(s): VITAMINB12, FOLATE, FERRITIN, TIBC, IRON, RETICCTPCT in the last 72 hours. Sepsis Labs: No results for input(s): PROCALCITON, LATICACIDVEN in the last 168 hours.  Recent Results (from the past 240 hour(s))  Aerobic/Anaerobic Culture (surgical/deep wound)      Status: None   Collection Time: 03/15/19  4:38 PM   Specimen: Abscess  Result Value Ref Range Status   Specimen Description   Final    ABSCESS PARASPINAL Performed at [facility], [address] [place], [place] [address]    Special Requests   Final    Normal Performed at [facility] [address] [place], [place] [address]    Gram Stain   Final    ABUNDANT WBC PRESENT, PREDOMINANTLY PMN NO ORGANISMS SEEN    Culture   Final    No growth aerobically or anaerobically. Performed at [facility], [place] [address] [place],  [address]    Report Status 03/20/2019 FINAL  Final         Radiology Studies: No results found.      Scheduled Meds: . amLODipine  5 mg Oral Daily  . Chlorhexidine Gluconate Cloth  6 each Topical Daily  . cyclobenzaprine  5 mg Oral TID  . feeding supplement (ENSURE ENLIVE)  237 mL Oral BID BM  . feeding supplement (PRO-STAT SUGAR FREE 64)  30 mL  Oral TID  . fentaNYL  1 patch Transdermal Q72H  . insulin aspart  0-24 Units Subcutaneous TID AC & HS  . lidocaine  1 patch Transdermal Q24H  . mouth rinse  15 mL Mouth Rinse BID  . metoprolol tartrate  50 mg Oral BID  . multivitamin with minerals  1 tablet Oral Daily  . pantoprazole  40 mg Oral Daily  . polyethylene glycol  17 g Oral Daily  . sodium chloride flush  10-40 mL Intracatheter Q12H  . sodium chloride flush  5 mL Intracatheter Q8H   Continuous Infusions: . sodium chloride 250 mL (03/07/19 2139)  .  ceFAZolin (ANCEF) IV 2 g (03/25/19 0512)  . sodium chloride       LOS: 31 days      Alwyn Ren, MD [facility]  If 7PM-7AM, please contact night-coverage [email or web address] Password [facility] 03/25/2019, 12:43 PM

## 2019-03-25 NOTE — Progress Notes (Signed)
Physical Therapy Treatment Patient Details Name: Franklin Anderson MRN: [id number] DOB: 28-Apr-1970 Today's Date: 03/25/2019    History of Present Illness back pain secondary to bilateral psoas abscess with progression of discitis and osteomyelitis with nerve compression and worsening of stenosis.  Patient had CT-guided right psoas aspiration by IR 02/22/2019 yielding 18 mL of purulent fluid with MSSA.  Patient has recurrent psoas abscess.  He is status post CT-guided placement of drainage catheter to the recurrent right paraspinal abscess yielded 15 cc of purulent material 03/16/2019.  He is status post drain in the right paraspinal space with ongoing serosanguineous drainage.  He will need to follow-up with IR upon discharge.  If there is a facility that is willing to take him with the drain in place he can be discharged he needs to for just follow-up with interventional radiology to get the drain out     General bed mobility comments: increased, increased time with HOB elevated and use of rails.  Excessive WBing B UE's use to "take pressure off my low back".  Required increased assist back to bed to support B LE's as pt was unable to self perform. General transfer comment: from elevated bed to B platform EVA walker with increased time and difficulty transitioning B UE's fro bed to walker as pt tends to "off load" his upper body weight to relieve amount through low back.   Excessive WBing B UE's.  Increased pain level with sit to stand and esp stand to sit.  Pt unable to tolerate sitting EOB due to spike in pain.General Gait Details: EXCESSIVE WBing through B platform EVA walker pt was able to amb around room a total of 12 feet with very short steps and great difficulty weight shifting as well as great difficulty advancing either LE.  B hips and knees flexed.  Poor ankle motion as he shuffled forward minimizing any heel strike/toe off. Max c/o low back pain.  Unable to stand erect and off load EXCESSIVE  weight pt was putting on B platforms (EVA walker).  B LE's alone will NOT support pt's body weight.          Follow Up Recommendations  SNF     Equipment Recommendations       Recommendations for Other Services       Precautions / Restrictions Precautions Precautions: Fall Restrictions Weight Bearing Restrictions: No    Mobility  Bed Mobility Overal bed mobility: Needs Assistance       Supine to sit: HOB elevated;Min assist   Sit to sidelying: Min assist;Mod assist General bed mobility comments: increased, increased time with HOB elevated and use of rails.  Excessive WBing B UE's use to "take pressure off my low back".  Required increased assist back to bed to support B LE's as pt was unable to self perform.  Transfers Overall transfer level: Needs assistance Equipment used: Bilateral platform walker(EVA walker) Transfers: Sit to/from Stand Sit to Stand: Min assist;+2 safety/equipment;From elevated surface         General transfer comment: from elevated bed to B platform EVA walker with increased time and difficulty transitioning B UE's fro bed to walker as pt tends to "off load" his upper body weight to relieve amount through low back.   Excessive WBing B UE's.  Increased pain level with sit to stand and esp stand to sit.  Pt unable to tolerate sitting EOB due to spike in pain.  Ambulation/Gait Ambulation/Gait assistance: Min assist;Mod assist Gait Distance (Feet): 12 Feet Assistive  device: Bilateral platform walker(EVA walker) Gait Pattern/deviations: Step-to pattern;Decreased step length - right;Decreased step length - left Gait velocity: decreased   General Gait Details: EXCESSIVE WBing through B platform EVA walker pt was able to amb around room a total of 12 feet with very short steps and great difficulty weight shifting as well as great difficulty advancing either LE.  B hips and knees flexed.  Poor ankle motion as he shuffled forward minimizing any heel  strike/toe off. Max c/o low back pain.  Unable to stand erect and off load EXCESSIVE weight pt was putting on B platforms (EVA walker).   Stairs             Wheelchair Mobility    Modified Rankin (Stroke Patients Only)       Balance                                            Cognition Arousal/Alertness: Awake/alert Behavior During Therapy: WFL for tasks assessed/performed Overall Cognitive Status: Within Functional Limits for tasks assessed                                 General Comments: pt agreeable to therpay today, pt complaining of pain throughout      Exercises      General Comments        Pertinent Vitals/Pain Pain Assessment: 0-10 Pain Score: 10-Worst pain ever Pain Location: back with mobility Pain Descriptors / Indicators: Guarding;Grimacing;Moaning;Sharp Pain Intervention(s): Monitored during session;Premedicated before session(pre medicated IV meds)    Home Living                      Prior Function            PT Goals (current goals can now be found in the care plan section) Progress towards PT goals: Progressing toward goals    Frequency    Min 2X/week      PT Plan Current plan remains appropriate    Co-evaluation              AM-PAC PT "6 Clicks" Mobility   Outcome Measure  Help needed turning from your back to your side while in a flat bed without using bedrails?: A Little Help needed moving from lying on your back to sitting on the side of a flat bed without using bedrails?: A Little Help needed moving to and from a bed to a chair (including a wheelchair)?: A Lot Help needed standing up from a chair using your arms (e.g., wheelchair or bedside chair)?: A Lot Help needed to walk in hospital room?: A Lot Help needed climbing 3-5 steps with a railing? : Total 6 Click Score: 13    End of Session Equipment Utilized During Treatment: Gait belt Activity Tolerance: Patient limited  by pain Patient left: with call bell/phone within reach;in bed;with nursing/sitter in room Nurse Communication: Mobility status PT Visit Diagnosis: Muscle weakness (generalized) (M62.81);Difficulty in walking, not elsewhere classified (R26.2)     Time: 3267-1245 PT Time Calculation (min) (ACUTE ONLY): 26 min  Charges:  $Gait Training: 8-22 mins $Therapeutic Activity: 8-22 mins                     Rica Koyanagi  PTA Acute  Rehabilitation Services Pager  [phone] Office      [phone]

## 2019-03-26 LAB — COMPREHENSIVE METABOLIC PANEL
ALT: 15 U/L (ref 0–44)
AST: 18 U/L (ref 15–41)
Albumin: 2.5 g/dL — ABNORMAL LOW (ref 3.5–5.0)
Alkaline Phosphatase: 104 U/L (ref 38–126)
Anion gap: 8 (ref 5–15)
BUN: 20 mg/dL (ref 6–20)
CO2: 28 mmol/L (ref 22–32)
Calcium: 9 mg/dL (ref 8.9–10.3)
Chloride: 102 mmol/L (ref 98–111)
Creatinine, Ser: 0.45 mg/dL — ABNORMAL LOW (ref 0.61–1.24)
GFR calc Af Amer: >60 mL/min (ref >60)
GFR calc non Af Amer: >60 mL/min (ref >60)
Glucose, Bld: 124 mg/dL — ABNORMAL HIGH (ref 70–99)
Potassium: 3.8 mmol/L (ref 3.5–5.1)
Sodium: 138 mmol/L (ref 135–145)
Total Bilirubin: 0.3 mg/dL (ref 0.3–1.2)
Total Protein: 7.5 g/dL (ref 6.5–8.1)

## 2019-03-26 LAB — CBC
HCT: 34.4 % — ABNORMAL LOW (ref 39.0–52.0)
Hemoglobin: 10.4 g/dL — ABNORMAL LOW (ref 13.0–17.0)
MCH: 29.4 pg (ref 26.0–34.0)
MCHC: 30.2 g/dL (ref 30.0–36.0)
MCV: 97.2 fL (ref 80.0–100.0)
Platelets: 487 10*3/uL — ABNORMAL HIGH (ref 150–400)
RBC: 3.54 MIL/uL — ABNORMAL LOW (ref 4.22–5.81)
RDW: 17.2 % — ABNORMAL HIGH (ref 11.5–15.5)
WBC: 7.9 10*3/uL (ref 4.0–10.5)
nRBC: 0 % (ref 0.0–0.2)

## 2019-03-26 LAB — GLUCOSE, CAPILLARY
Glucose-Capillary: 84 mg/dL (ref 70–99)
Glucose-Capillary: 134 mg/dL — ABNORMAL HIGH (ref 70–99)
Glucose-Capillary: 108 mg/dL — ABNORMAL HIGH (ref 70–99)
Glucose-Capillary: 283 mg/dL — ABNORMAL HIGH (ref 70–99)

## 2019-03-26 MED ORDER — KETOROLAC TROMETHAMINE 30 MG/ML IJ SOLN
30.0000 mg | Freq: Four times a day (QID) | INTRAMUSCULAR | Status: AC
  Administered 2019-03-26 – 2019-03-31 (×20): 30 mg via INTRAVENOUS
  Filled 2019-03-26 (×20): qty 1

## 2019-03-26 NOTE — Progress Notes (Signed)
PROGRESS NOTE    Franklin Gaulaul R Daher   ZOX:[id number]  DOB: 1970-06-03  DOA: 02/21/2019 PCP: Julieanne MansonMulberry, Elizabeth, MD   Brief Narrative:  Franklin Anderson 47 y.o.malewith medical history significant of rheumatic fever, HTN, ongoingIV heroin abuse, presented to the ED on 10/27 with 3 week course of progressively worsening back pain and BLE weakness associated with some bowel incontinence and trouble walking. Last use of heroin was on the day of admission. He was found to be septic with MSSA bacteremia, endocarditis, osteomyelitis, discitis at L4 and L5, septic pulmonary emboli with loculated effusion and pneumothorax and psoas abscesses. On 10/28 was found to be obtunded and dyspneic and was intubated by critical care, extubated on 10/31. Right psoas abscess was aspirated on 10/28, wound care >MSSA. Respiratory culture from 10/28 also grew MSSA.  Patient had acute respiratory distress on 02/28/2019 was intubated. He had chest tube placement for loculated pneumothorax/CT of the chest indicating empyema pulmonary embolism hydropneumothorax   Subjective: Ongoing pain in left back.    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- chest tube removed - cont Ancef - per CT surgery, not a good candidate for thoracotomy - s/p drainage cath of right paraspinal abscess by IR- catheter was removed on 11/28  Active Problems:     Septic pulmonary embolism and acute hypoxic respiratory failure - intubated on 10/28- 10/31 - resolved     Acute encephalopathy - due to infection - has resolved   severe protein calorie malnutrition - cont dietary supplements   Time spent in minutes: 35 DVT prophylaxis: TEDS Code Status: Full code Family Communication:  Disposition Plan: home  when IV antibiotics completed Consultants:   PCCM, cardiology, ID, palliative care, cardiothoracic surgery Antimicrobials:  Anti-infectives (From admission, onward)   Start     Dose/Rate Route Frequency Ordered Stop   02/24/19 2200  vancomycin (VANCOCIN) IVPB 1000 mg/200 mL premix  Status:  Discontinued     1,000 mg 200 mL/hr over 60 Minutes Intravenous Every 12 hours 02/24/19 1010 02/24/19 1253   02/24/19 1600  ceFAZolin (ANCEF) IVPB 2g/100 mL premix     2 g 200 mL/hr over 30 Minutes Intravenous Every 8 hours 02/24/19 1253 04/12/19 2359   02/23/19 2200  vancomycin (VANCOCIN) IVPB 750 mg/150 ml premix  Status:  Discontinued     750 mg 150 mL/hr over 60 Minutes Intravenous Every 24 hours 02/23/19 0853 02/23/19 0909   02/23/19 2200  vancomycin (VANCOCIN) IVPB 750 mg/150 ml premix  Status:  Discontinued     750 mg 150 mL/hr over 60 Minutes Intravenous Every 12 hours 02/23/19 0909 02/24/19 1010   02/22/19 2200  vancomycin (VANCOCIN) IVPB 750 mg/150 ml premix  Status:  Discontinued     750 mg 150 mL/hr over 60 Minutes Intravenous Every 24 hours 02/22/19 0826 02/22/19 0827   02/22/19 2200  vancomycin (VANCOCIN) IVPB 1000 mg/200 mL premix  Status:  Discontinued     1,000 mg 200 mL/hr over 60 Minutes Intravenous Every 24 hours 02/22/19 0827 02/23/19 0853   02/22/19 0900  ceFEPIme (MAXIPIME) 2 g in sodium chloride 0.9 % 100 mL IVPB  Status:  Discontinued     2 g 200 mL/hr over 30 Minutes Intravenous Every 12 hours 02/22/19 0826 02/23/19 1135   02/21/19 2015  ceFEPIme (MAXIPIME) 2 g in sodium  chloride 0.9 % 100 mL IVPB     2 g 200 mL/hr over 30 Minutes Intravenous  Once 02/21/19 2013 02/22/19 0031   02/21/19 2015  metroNIDAZOLE (FLAGYL) IVPB 500 mg     500 mg 100 mL/hr over 60 Minutes Intravenous  Once 02/21/19 2013 02/22/19 0031   02/21/19 2015  vancomycin (VANCOCIN) IVPB 1000 mg/200 mL premix     1,000 mg 200 mL/hr over 60 Minutes Intravenous  Once 02/21/19 2013 02/22/19 0200        Objective: Vitals:   03/25/19 1426 03/25/19 2127 03/26/19 0552 03/26/19 1415  BP: 133/85 (!) 132/92 (!) 118/94 (!) 130/92  Pulse: 97 (!) 105 82 97  Resp:  16 18 16   Temp: 97.7 F (36.5 C) 98.8 F (37.1 C) (!) 97.3 F (36.3 C) 98.2 F (36.8 C)  TempSrc: Oral Oral Oral Oral  SpO2: 98% 97% 99% 98%  Weight:      Height:        Intake/Output Summary (Last 24 hours) at 03/26/2019 1640 Last data filed at 03/26/2019 1415 Gross per 24 hour  Intake 480 ml  Output 950 ml  Net -470 ml   Filed Weights   03/14/19 0500 03/15/19 0522 03/15/19 2000  Weight: 49.8 kg 49.9 kg 49.9 kg    Examination: General exam: Appears comfortable  HEENT: PERRLA, oral mucosa moist, no sclera icterus or thrush Respiratory system: Clear to auscultation. Respiratory effort normal. Cardiovascular system: S1 & S2 heard, RRR.   Gastrointestinal system: Abdomen soft, non-tender, nondistended. Normal bowel sounds. Central nervous system: Alert and oriented. No focal neurological deficits. Extremities: No cyanosis, clubbing or edema Skin: No rashes or ulcers Psychiatry:  Mood & affect appropriate.     Data Reviewed: I have personally reviewed following labs and imaging studies  CBC: Recent Labs  Lab 03/26/19 0915  WBC 7.9  HGB 10.4*  HCT 34.4*  MCV 97.2  PLT 487*   Basic Metabolic Panel: Recent Labs  Lab 03/26/19 0915  NA 138  K 3.8  CL 102  CO2 28  GLUCOSE 124*  BUN 20  CREATININE 0.45*  CALCIUM 9.0   GFR: Estimated Creatinine Clearance: 78.8 mL/min (A) (by C-G formula based on SCr of 0.45 mg/dL (L)). Liver Function Tests: Recent Labs  Lab 03/26/19 0915  AST 18  ALT 15  ALKPHOS 104  BILITOT 0.3  PROT 7.5  ALBUMIN 2.5*   No results for input(s): LIPASE, AMYLASE in the last 168 hours. No results for input(s): AMMONIA in the last 168 hours. Coagulation Profile: No results for input(s): INR, PROTIME in the last 168 hours. Cardiac Enzymes: No results for input(s): CKTOTAL, CKMB,  CKMBINDEX, TROPONINI in the last 168 hours. BNP (last 3 results) No results for input(s): PROBNP in the last 8760 hours. HbA1C: No results for input(s): HGBA1C in the last 72 hours. CBG: Recent Labs  Lab 03/25/19 1152 03/25/19 1622 03/25/19 2125 03/26/19 0727 03/26/19 1228  GLUCAP 145* 110* 122* 84 134*   Lipid Profile: No results for input(s): CHOL, HDL, LDLCALC, TRIG, CHOLHDL, LDLDIRECT in the last 72 hours. Thyroid Function Tests: No results for input(s): TSH, T4TOTAL, FREET4, T3FREE, THYROIDAB in the last 72 hours. Anemia Panel: No results for input(s): VITAMINB12, FOLATE, FERRITIN, TIBC, IRON, RETICCTPCT in the last 72 hours. Urine analysis:    Component Value Date/Time   BILIRUBINUR neg 12/21/2017 1551   PROTEINUR Negative 12/21/2017 1551   UROBILINOGEN 0.2 12/21/2017 1551   NITRITE neg 12/21/2017 1551   LEUKOCYTESUR Negative 12/21/2017 1551  Sepsis Labs: @LABRCNTIP (procalcitonin:4,lacticidven:4) )No results found for this or any previous visit (from the past 240 hour(s)).       Radiology Studies: No results found.    Scheduled Meds: . amLODipine  5 mg Oral Daily  . Chlorhexidine Gluconate Cloth  6 each Topical Daily  . cyclobenzaprine  5 mg Oral TID  . feeding supplement (ENSURE ENLIVE)  237 mL Oral BID BM  . feeding supplement (PRO-STAT SUGAR FREE 64)  30 mL Oral TID  . fentaNYL  1 patch Transdermal Q72H  . insulin aspart  0-24 Units Subcutaneous TID AC & HS  . lidocaine  1 patch Transdermal Q24H  . mouth rinse  15 mL Mouth Rinse BID  . metoprolol tartrate  100 mg Oral BID  . multivitamin with minerals  1 tablet Oral Daily  . pantoprazole  40 mg Oral Daily  . polyethylene glycol  17 g Oral Daily  . sodium chloride flush  10-40 mL Intracatheter Q12H  . sodium chloride flush  5 mL Intracatheter Q8H   Continuous Infusions: . sodium chloride 250 mL (03/07/19 2139)  .  ceFAZolin (ANCEF) IV 2 g (03/26/19 1507)  . sodium chloride       LOS: 32 days       Debbe Odea, MD [facility] Pager: [email or web address] Password [email or web address] 03/26/2019, 4:40 PM

## 2019-03-26 NOTE — Progress Notes (Signed)
Patient refused morning labs, states he will think about letting someone get his labs after he eats breakfast. NP Bodenhiemer text paged update.

## 2019-03-27 ENCOUNTER — Inpatient Hospital Stay (HOSPITAL_COMMUNITY): Payer: Self-pay

## 2019-03-27 LAB — GLUCOSE, CAPILLARY
Glucose-Capillary: 129 mg/dL — ABNORMAL HIGH (ref 70–99)
Glucose-Capillary: 123 mg/dL — ABNORMAL HIGH (ref 70–99)
Glucose-Capillary: 117 mg/dL — ABNORMAL HIGH (ref 70–99)
Glucose-Capillary: 276 mg/dL — ABNORMAL HIGH (ref 70–99)

## 2019-03-27 IMAGING — MR MR LUMBAR SPINE WO/W CM
4 of 7 series · 18 of 48 positions shown · IV contrast (yes)
Comparison: CT scan [DATE] MRI from [DATE]

CLINICAL DATA: Back pain, infection.

EXAM:
MRI LUMBAR SPINE WITHOUT AND WITH CONTRAST
TECHNIQUE: Multiplanar and multiecho pulse sequences of the lumbar spine were
obtained without and with intravenous contrast.
CONTRAST:  5mL GADAVIST GADOBUTROL 1 MMOL/ML IV SOLN

[Series 3: T1 · sagittal · 4.0mm · 0.51mm/px · 3 of 14 slices shown (1 of 2)]
[im 1/14]
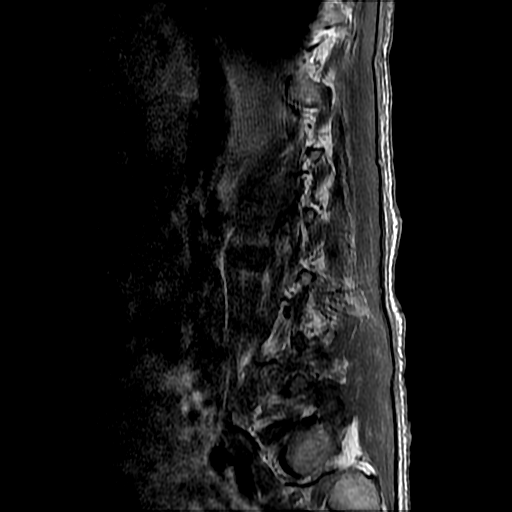
[im 7/14]
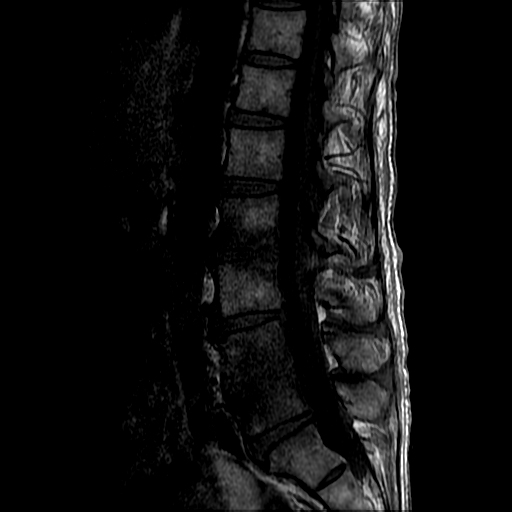
[im 14/14]
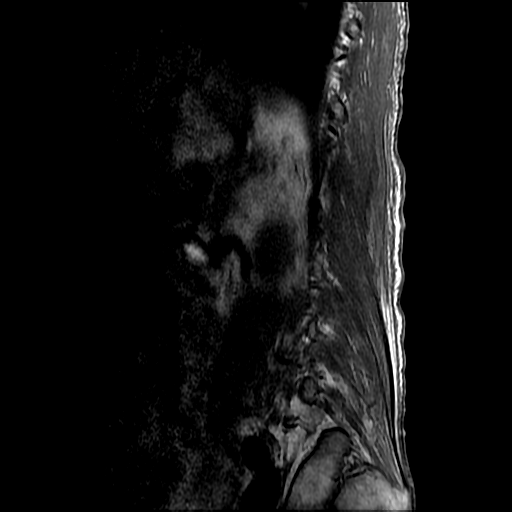

[Series 4: T2 · sagittal · 4.0mm · 0.51mm/px · 4 of 14 slices shown (1 of 2)]
[im 1/14]
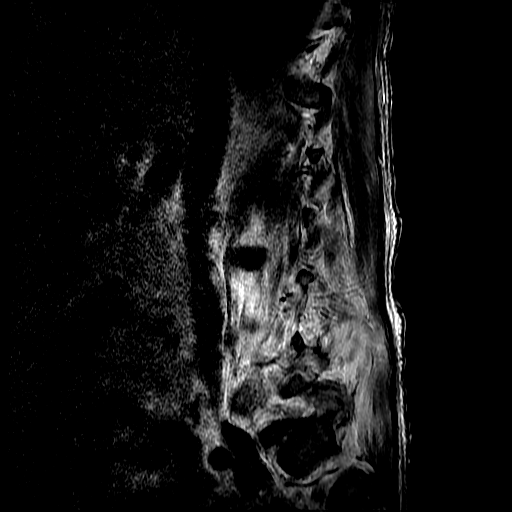
[im 5/14]
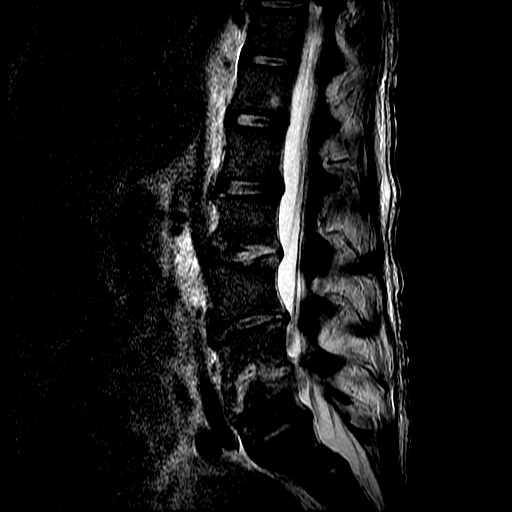
[im 9/14]
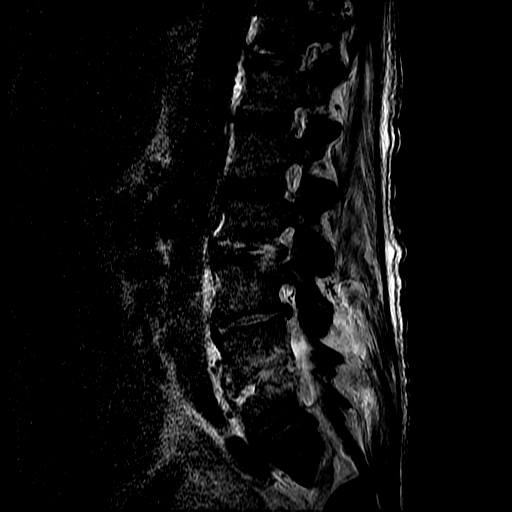
[im 14/14]
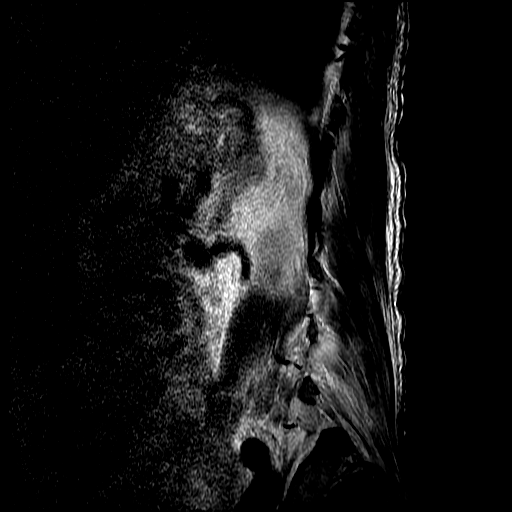

[Series 6: T2 · axial · 4.0mm · 0.39mm/px · z∈[-105,+73]mm · 8 of 41 slices shown (2 of 2)]
[im 1/41]
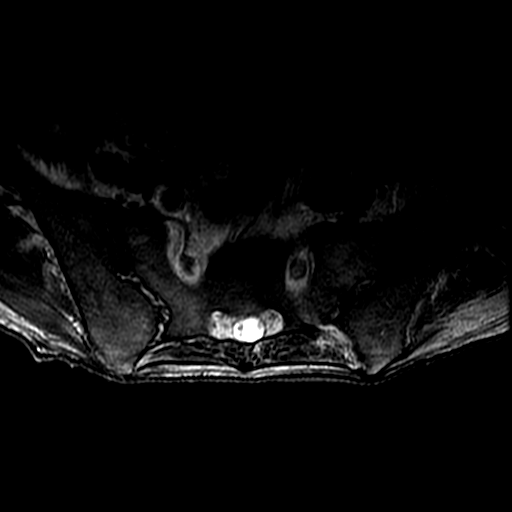
[im 5/41]
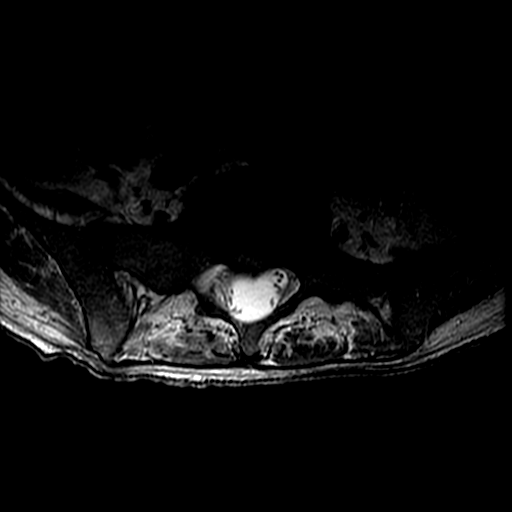
[im 9/41]
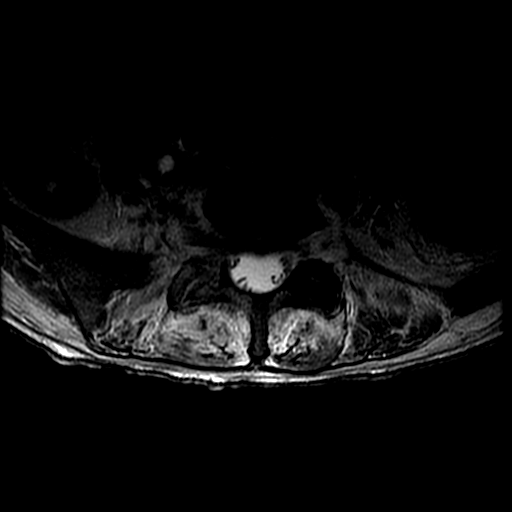
[im 13/41]
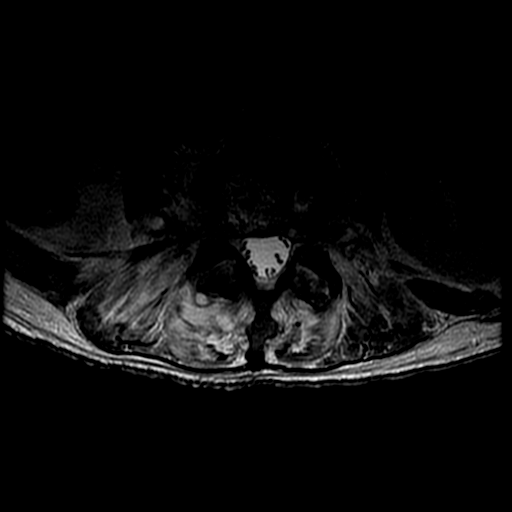
[im 17/41]
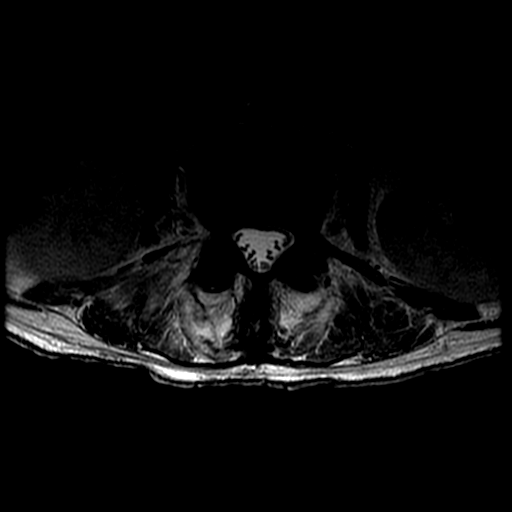
[im 21/41]
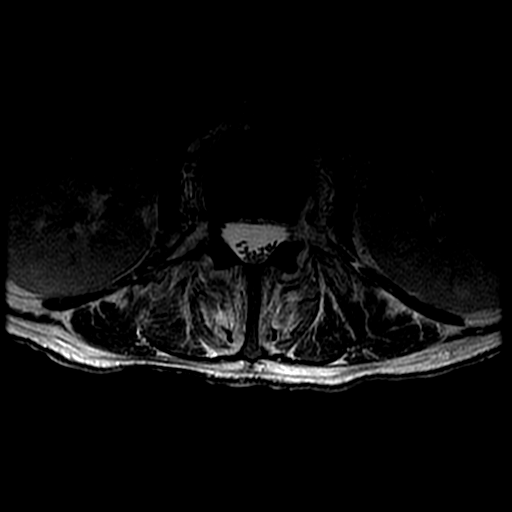
[im 25/41]
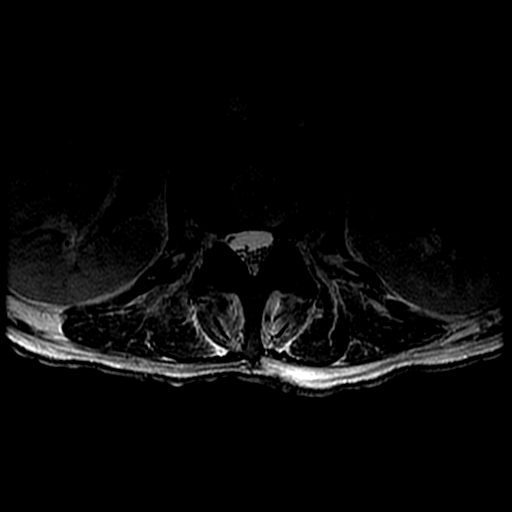
[im 37/41]
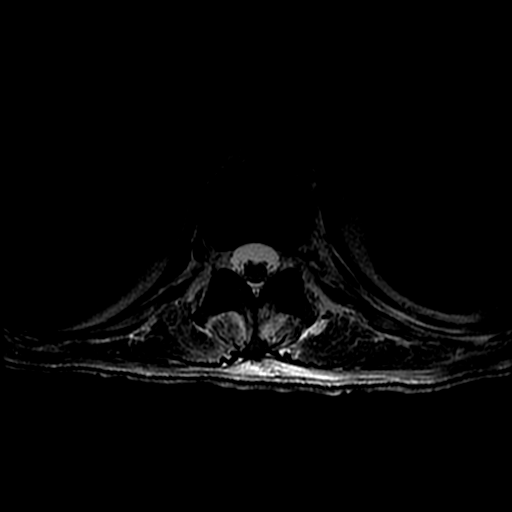

[Series 7: T1 · axial · 4.0mm · 0.39mm/px · z∈[-85,+73]mm · 3 of 41 slices shown (2 of 2)]
[im 5/41]
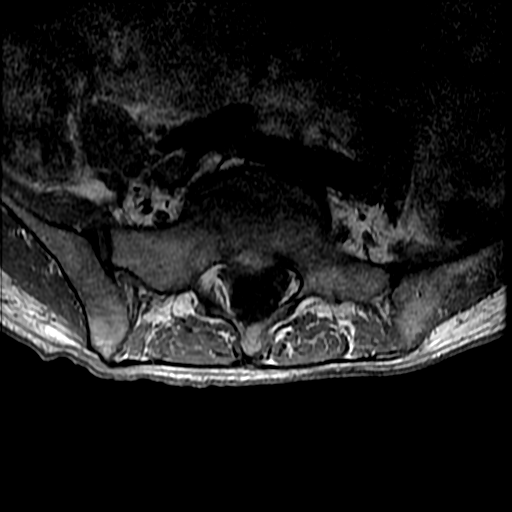
[im 21/41]
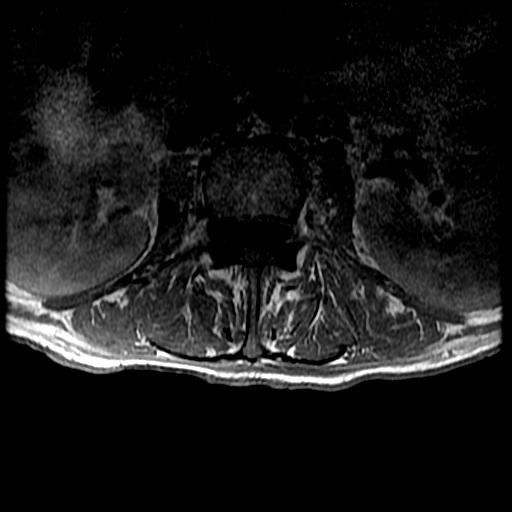
[im 37/41]
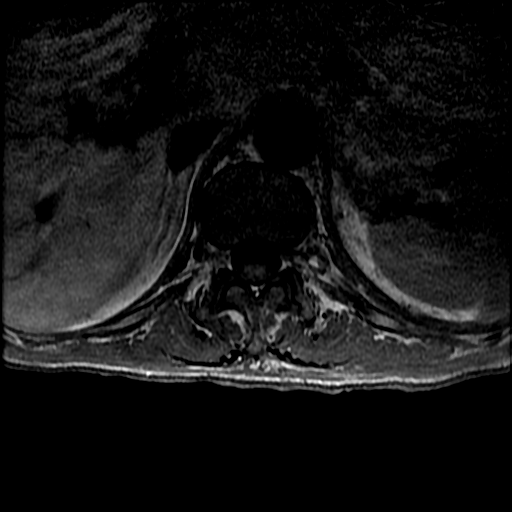

[18 of 48 positions shown; findings below may reference images not displayed]

FINDINGS: Segmentation: The lowest lumbar type non-rib-bearing vertebra is
labeled as L5.

Alignment:  No vertebral subluxation is observed.

Vertebrae: Again observed discitis osteomyelitis at the L4-5 level
with endplate edema and sclerosis, and irregular rim enhancing
collections along the anterior intervertebral disc space. There is
bilateral facet edema and enhancement at this level, and septic
arthritis of the facet joints is not excluded given the appearance.

Endplate enhancement and type 1 degenerative endplate findings at
L2-3, with subtle accentuated T2 signal in the L2-3 intervertebral
disc, a second site of early discitis is likely.

Stable 6 mm focus of T2 hyperintensity in the T12 vertebral body,
probably a small hemangioma or similar benign lesion.

Left eccentric type 1 degenerative endplate findings at T11-12.

Conus medullaris and cauda equina: Conus extends to the L1-2.

Paraspinal and other soft tissues: Small abscesses are present along
the right psoas muscle with surrounding myositis. This includes a
0.9 by 1.0 by 2.0 cm abscess on image 34/6 anteriorly, and a 0.9 cm
posterior right psoas abscess on image [DATE].

Paraspinal phlegmon is most prominent at the L4-5 level and
surrounds the vertebral level, and also at the L2-3 level and
adjacent levels up to T12.

The previous enhancing epidural phlegmon at the L4-5 level is less
striking than on the prior exam. There is abnormal enhancement
extending in the L4-5 and to a lesser extent L5-S1 neural foramina
bilaterally. I do not see a well-defined epidural abscess.

Disc levels:

T12-L1: Unremarkable.

L1-2: No impingement.

L2-3: Borderline left subarticular lateral recess stenosis due to
left eccentric intervertebral spurring and facet spurring.

L3-4: Mild left subarticular lateral recess stenosis and mild left
foraminal stenosis due to left lateral recess and foraminal disc
protrusion, disc bulge, and mild facet arthropathy.

L4-5: At least mild bilateral foraminal stenosis due to facet and
intervertebral spurring.

L5-S1: Borderline bilateral foraminal stenosis due to disc bulge,
intervertebral spurring, and facet arthropathy.
IMPRESSION: 1. Active discitis-osteomyelitis at L4-5 and L2-3, with small right
psoas muscle abscesses (reduced in size from prior exam) and
considerable paraspinal phlegmon tracking between T12 L5. The amount
of anterior epidural phlegmon at the L4-5 level is improved, but
there is continued abnormal enhancement in both neural foramina at
L4-5 and L5-S1. In addition, the facet joints at L4-5 demonstrate
severe arthropathy and septic facet joints are not excluded.
2. Lumbar spondylosis and degenerative disc disease with mild
impingement at L3-4 and L4-5.

## 2019-03-27 MED ORDER — ENOXAPARIN SODIUM 40 MG/0.4ML ~~LOC~~ SOLN
40.0000 mg | SUBCUTANEOUS | Status: DC
  Administered 2019-03-27 – 2019-04-14 (×19): 40 mg via SUBCUTANEOUS
  Filled 2019-03-27 (×19): qty 0.4

## 2019-03-27 MED ORDER — LORAZEPAM 2 MG/ML IJ SOLN
1.0000 mg | Freq: Once | INTRAMUSCULAR | Status: AC
  Administered 2019-03-27: 1 mg via INTRAVENOUS
  Filled 2019-03-27: qty 1

## 2019-03-27 MED ORDER — DICLOFENAC SODIUM 1 % EX GEL
2.0000 g | Freq: Four times a day (QID) | CUTANEOUS | Status: DC
  Administered 2019-03-27 – 2019-04-03 (×28): 2 g via TOPICAL
  Filled 2019-03-27: qty 100

## 2019-03-27 MED ORDER — GADOBUTROL 1 MMOL/ML IV SOLN
5.0000 mL | Freq: Once | INTRAVENOUS | Status: AC | PRN
  Administered 2019-03-27: 5 mL via INTRAVENOUS

## 2019-03-27 NOTE — Progress Notes (Signed)
Physical Therapy Treatment Patient Details Name: Franklin Anderson MRN: [id number] DOB: 04-24-1971 Today's Date: 03/27/2019    History of Present Illness Sepsis - MSSA bacteremia with L4/L5 osteomyelitis and discitis, epidural abscess, psoas abscess, septic pulmonary emboli, empyema and right ventricular mural vegetation    PT Comments    Assisted OOB but unable to amb due to pain.  General bed mobility comments: increased, increased time with HOB elevated and use of rails.  Excessive WBing B UE's use to "take pressure off my low back".  Required increased assist back to bed to support B LE's as pt was unable to self perform. General transfer comment: from elevated bed to B platform EVA walker with increased time and difficulty transitioning B UE's fro bed to walker as pt tends to "off load" his upper body weight to relieve amount through low back.   Excessive WBing B UE's.  Increased pain level with sit to stand and esp stand to sit.  Pt unable to tolerate sitting EOB due to spike in pain.General Gait Details: pt was only able to stand <1 min due to increased pain "I can't walk, I got to sit back down".   Assisted back to bed.    Follow Up Recommendations  SNF     Equipment Recommendations       Recommendations for Other Services       Precautions / Restrictions Restrictions Weight Bearing Restrictions: No    Mobility  Bed Mobility Overal bed mobility: Needs Assistance Bed Mobility: Supine to Sit     Supine to sit: Mod assist Sit to supine: Mod assist;Max assist   General bed mobility comments: increased, increased time with HOB elevated and use of rails.  Excessive WBing B UE's use to "take pressure off my low back".  Required increased assist back to bed to support B LE's as pt was unable to self perform.  Transfers Overall transfer level: Needs assistance Equipment used: Bilateral platform walker Transfers: Sit to/from Stand Sit to Stand: Min assist;+2  safety/equipment;From elevated surface Stand pivot transfers: Min assist;+2 safety/equipment       General transfer comment: from elevated bed to B platform EVA walker with increased time and difficulty transitioning B UE's fro bed to walker as pt tends to "off load" his upper body weight to relieve amount through low back.   Excessive WBing B UE's.  Increased pain level with sit to stand and esp stand to sit.  Pt unable to tolerate sitting EOB due to spike in pain.  Ambulation/Gait             General Gait Details: pt was only able to stand <1 min due to increased pain "I can't walk, I got to sit back down".   Stairs             Wheelchair Mobility    Modified Rankin (Stroke Patients Only)       Balance                                            Cognition Arousal/Alertness: Awake/alert Behavior During Therapy: WFL for tasks assessed/performed Overall Cognitive Status: Within Functional Limits for tasks assessed                                 General Comments: pt agreeable to  therpay today, pt complaining of pain throughout      Exercises      General Comments        Pertinent Vitals/Pain Pain Assessment: 0-10 Pain Score: 8  Pain Location: back with mobility Pain Descriptors / Indicators: Guarding;Grimacing;Moaning;Sharp Pain Intervention(s): Monitored during session;Premedicated before session;Repositioned    Home Living                      Prior Function            PT Goals (current goals can now be found in the care plan section)      Frequency    Min 2X/week      PT Plan Current plan remains appropriate    Co-evaluation              AM-PAC PT "6 Clicks" Mobility   Outcome Measure  Help needed turning from your back to your side while in a flat bed without using bedrails?: A Little Help needed moving from lying on your back to sitting on the side of a flat bed without using  bedrails?: A Little Help needed moving to and from a bed to a chair (including a wheelchair)?: A Lot Help needed standing up from a chair using your arms (e.g., wheelchair or bedside chair)?: A Lot Help needed to walk in hospital room?: A Lot Help needed climbing 3-5 steps with a railing? : Total 6 Click Score: 13    End of Session Equipment Utilized During Treatment: Gait belt Activity Tolerance: Patient limited by pain Patient left: with call bell/phone within reach;in bed;with nursing/sitter in room Nurse Communication: Mobility status PT Visit Diagnosis: Muscle weakness (generalized) (M62.81);Difficulty in walking, not elsewhere classified (R26.2)     Time: 5638-7564 PT Time Calculation (min) (ACUTE ONLY): 18 min  Charges:  $Therapeutic Activity: 8-22 mins                     Felecia Shelling  PTA Acute  Rehabilitation Services Pager      [phone] Office      [phone]

## 2019-03-27 NOTE — Progress Notes (Signed)
PROGRESS NOTE    [name]   WCB:[id number]  DOB: 1970-10-23  DOA: 02/21/2019 PCP: Franklin Hook, MD   Brief Narrative:  Franklin Anderson 47 y.o.malewith medical history significant of rheumatic fever, HTN, ongoingIV heroin abuse, nicotine abuse, presented to the ED on 10/27 with 3 week course of progressively worsening back pain and BLE weakness associated with some bowel incontinence and trouble walking. Last use of heroin was on the day of admission.  In ED > Found to have:sepsis with Tm 100, HR 126, RR 31, WBC 22.4k,AKI with BUN 111 and creat 1.7. Imaging studies revealed osteomyelitis and diskitis of L4-L5,septic pulmonary emboli and psoas abscesses. He was started on IV antibiotics and admitted to [facility].  On 10/28 was found to be obtunded and dyspneic and was intubated. Blood cultures from admission grew out MSSA. Subsequently found to have RV vegetation,  left sided empyema (MSSA) and  a left pneumothorax for which he received a chest tube.  Subjective: Patient insisting that his pain in the back has increased and is requesting another CT. Also asking for more IV pain medications.     Assessment & Plan:   Principal Problem:   Sepsis - MSSA bacteremia with L4/L5 osteomyelitis and discitis, epidural abscess, psoas abscess, septic pulmonary emboli, empyema and right ventricular mural vegetation - Right psoas abscess was aspirated on 10/28 and culture revealedMSSA. - Respiratory culture from 10/28 also grew MSSA. - 10/28 > 2D echo showed possible mitral valve vegetation and moderate pericardial effusion - 11/3> TEE-  There is a medium sized, mobile mass attached to the trabeculations in the RV and moderate sized pericardial effusion - per CT surgery, he was not a good candidate for thoracotomy - increasing pain on 11/17- MRI revealed increasing psoas abscesses- s/p drainage cath of right paraspinal abscess by IR 11/18- catheter was removed on 11/27  -ID plan>  cont  Ancef through 12/16 followed by Keflex x 30 days - he has ongoing complaints of pain and apparently has increasing pain today in his right lumbar area. Although he is likely drug seeking, I don't want to completely disregard his symptoms either - will obtain an MRI of back today   Active Problems:   Acute hypoxic respiratory failure - due to MSSA infection with septic pulmonary emboli, left empyema and left bronchopleural fistula (hydropneumothorax) - intubated on 10/28- 10/31  - 10/28 Bronchoscopy with BAL  - s/p left chest tube- removed on 11/17 - now on room air  Moderate Pericardial effusion - treated conservatively and improving- effusion was mild per last  ECHO on 11/9  HTN - cont Norvasc and Metoprolol     Acute metabolic encephalopathy - due to infection - has resolved   Severe protein calorie malnutrition - cont dietary supplements  HCV + - per ID, seems to have cleared   Time spent in minutes: 35 DVT prophylaxis: Lovenox Code Status: Full code Family Communication:  Disposition Plan: home vs SNF when IV antibiotics completed Consultants:   PCCM, cardiology, ID, palliative care, cardiothoracic surgery Antimicrobials:  Anti-infectives (From admission, onward)   Start     Dose/Rate Route Frequency Ordered Stop   02/24/19 2200  vancomycin (VANCOCIN) IVPB 1000 mg/200 mL premix  Status:  Discontinued     1,000 mg 200 mL/hr over 60 Minutes Intravenous Every 12 hours 02/24/19 1010 02/24/19 1253   02/24/19 1600  ceFAZolin (ANCEF) IVPB 2g/100 mL premix     2 g 200 mL/hr over 30 Minutes Intravenous Every 8 hours  02/24/19 1253 04/12/19 2359   02/23/19 2200  vancomycin (VANCOCIN) IVPB 750 mg/150 ml premix  Status:  Discontinued     750 mg 150 mL/hr over 60 Minutes Intravenous Every 24 hours 02/23/19 0853 02/23/19 0909   02/23/19 2200  vancomycin (VANCOCIN) IVPB 750 mg/150 ml premix  Status:  Discontinued     750 mg 150 mL/hr over 60 Minutes Intravenous Every 12 hours  02/23/19 0909 02/24/19 1010   02/22/19 2200  vancomycin (VANCOCIN) IVPB 750 mg/150 ml premix  Status:  Discontinued     750 mg 150 mL/hr over 60 Minutes Intravenous Every 24 hours 02/22/19 0826 02/22/19 0827   02/22/19 2200  vancomycin (VANCOCIN) IVPB 1000 mg/200 mL premix  Status:  Discontinued     1,000 mg 200 mL/hr over 60 Minutes Intravenous Every 24 hours 02/22/19 0827 02/23/19 0853   02/22/19 0900  ceFEPIme (MAXIPIME) 2 g in sodium chloride 0.9 % 100 mL IVPB  Status:  Discontinued     2 g 200 mL/hr over 30 Minutes Intravenous Every 12 hours 02/22/19 0826 02/23/19 1135   02/21/19 2015  ceFEPIme (MAXIPIME) 2 g in sodium chloride 0.9 % 100 mL IVPB     2 g 200 mL/hr over 30 Minutes Intravenous  Once 02/21/19 2013 02/22/19 0031   02/21/19 2015  metroNIDAZOLE (FLAGYL) IVPB 500 mg     500 mg 100 mL/hr over 60 Minutes Intravenous  Once 02/21/19 2013 02/22/19 0031   02/21/19 2015  vancomycin (VANCOCIN) IVPB 1000 mg/200 mL premix     1,000 mg 200 mL/hr over 60 Minutes Intravenous  Once 02/21/19 2013 02/22/19 0200       Objective: Vitals:   03/26/19 0552 03/26/19 1415 03/26/19 1954 03/27/19 0626  BP: (!) 118/94 (!) 130/92 128/89 (!) 137/96  Pulse: 82 97 91 84  Resp: _0 Temp: (!) 97.3 F (36.3 C) 98.2 F (36.8 C) 98 F (36.7 C) (!) 97.5 F (36.4 C)  TempSrc: Oral Oral Oral Oral  SpO2: 99% 98% 99% 93%  Weight:      Height:        Intake/Output Summary (Last 24 hours) at 03/27/2019 1036 Last data filed at 03/27/2019 0828 Gross per 24 hour  Intake 960 ml  Output 700 ml  Net 260 ml   Filed Weights   03/14/19 0500 03/15/19 0522 03/15/19 2000  Weight: 49.8 kg 49.9 kg 49.9 kg    Examination: General exam: Appears comfortable -  HEENT: PERRLA, oral mucosa moist, no sclera icterus or thrush Respiratory system: Clear to auscultation. Respiratory effort normal. Cardiovascular system: S1 & S2 heard,  No murmurs  Gastrointestinal system: Abdomen soft, non-tender,  nondistended. Normal bowel sounds   Central nervous system: Alert and oriented. No focal neurological deficits. Extremities: No cyanosis, clubbing or edema MSK- tender across lower back with more tenderness in L lumbar area today Skin: No rashes or ulcers Psychiatry:  Mood & affect appropriate.    Data Reviewed: I have personally reviewed following labs and imaging studies  CBC: Recent Labs  Lab 03/26/19 0915  WBC 7.9  HGB 10.4*  HCT 34.4*  MCV 97.2  PLT 268*   Basic Metabolic Panel: Recent Labs  Lab 03/26/19 0915  NA 138  K 3.8  CL 102  CO2 28  GLUCOSE 124*  BUN 20  CREATININE 0.45*  CALCIUM 9.0   GFR: Estimated Creatinine Clearance: 78.8 mL/min (A) (by C-G formula based on SCr of 0.45 mg/dL (L)). Liver Function Tests: Recent Labs  Lab 03/26/19 0915  AST 18  ALT 15  ALKPHOS 104  BILITOT 0.3  PROT 7.5  ALBUMIN 2.5*   No results for input(s): LIPASE, AMYLASE in the last 168 hours. No results for input(s): AMMONIA in the last 168 hours. Coagulation Profile: No results for input(s): INR, PROTIME in the last 168 hours. Cardiac Enzymes: No results for input(s): CKTOTAL, CKMB, CKMBINDEX, TROPONINI in the last 168 hours. BNP (last 3 results) No results for input(s): PROBNP in the last 8760 hours. HbA1C: No results for input(s): HGBA1C in the last 72 hours. CBG: Recent Labs  Lab 03/26/19 0727 03/26/19 1228 03/26/19 1639 03/26/19 2059 03/27/19 0736  GLUCAP 84 134* 108* 283* 129*   Lipid Profile: No results for input(s): CHOL, HDL, LDLCALC, TRIG, CHOLHDL, LDLDIRECT in the last 72 hours. Thyroid Function Tests: No results for input(s): TSH, T4TOTAL, FREET4, T3FREE, THYROIDAB in the last 72 hours. Anemia Panel: No results for input(s): VITAMINB12, FOLATE, FERRITIN, TIBC, IRON, RETICCTPCT in the last 72 hours. Urine analysis:    Component Value Date/Time   BILIRUBINUR neg 12/21/2017 1551   PROTEINUR Negative 12/21/2017 1551   UROBILINOGEN 0.2 12/21/2017  1551   NITRITE neg 12/21/2017 1551   LEUKOCYTESUR Negative 12/21/2017 1551   Sepsis Labs: _0 (procalcitonin:4,lacticidven:4) )No results found for this or any previous visit (from the past 240 hour(s)).       Radiology Studies: No results found.    Scheduled Meds: . amLODipine  5 mg Oral Daily  . Chlorhexidine Gluconate Cloth  6 each Topical Daily  . cyclobenzaprine  5 mg Oral TID  . feeding supplement (ENSURE ENLIVE)  237 mL Oral BID BM  . feeding supplement (PRO-STAT SUGAR FREE 64)  30 mL Oral TID  . fentaNYL  1 patch Transdermal Q72H  . ketorolac  30 mg Intravenous Q6H  . lidocaine  1 patch Transdermal Q24H  . mouth rinse  15 mL Mouth Rinse BID  . metoprolol tartrate  100 mg Oral BID  . multivitamin with minerals  1 tablet Oral Daily  . pantoprazole  40 mg Oral Daily  . polyethylene glycol  17 g Oral Daily   Continuous Infusions: . sodium chloride 250 mL (03/07/19 2139)  .  ceFAZolin (ANCEF) IV 2 g (03/27/19 0558)     LOS: 33 days      Debbe Odea, MD [facility] Pager: [email or web address] Password [email or web address] 03/27/2019, 10:36 AM

## 2019-03-28 LAB — GLUCOSE, CAPILLARY
Glucose-Capillary: 81 mg/dL (ref 70–99)
Glucose-Capillary: 80 mg/dL (ref 70–99)
Glucose-Capillary: 107 mg/dL — ABNORMAL HIGH (ref 70–99)
Glucose-Capillary: 96 mg/dL (ref 70–99)

## 2019-03-28 NOTE — Progress Notes (Signed)
Nutrition Follow-up  DOCUMENTATION CODES:   Severe malnutrition in context of social or environmental circumstances  INTERVENTION:   -Ensure Enlive po BID, each supplement provides 350 kcal and 20 grams of protein -D/c Prostat -not taking -Magic cup TID with meals, each supplement provides 290 kcal and 9 grams of protein -Multivitamin with minerals daily  NUTRITION DIAGNOSIS:   Severe Malnutrition related to social / environmental circumstances(IVDU) as evidenced by severe fat depletion, severe muscle depletion.  Ongoing.  GOAL:   Patient will meet greater than or equal to 90% of their needs  Meeting.  MONITOR:   PO intake, Supplement acceptance, Weight trends, Skin, I & O's, Labs  ASSESSMENT:   48 y.o. male with medical history of IVDU, ongoing heroin use. Patient presented to the ED on 10/27 with 3 week history of progressively worsening back pain and BLE weakness.  Denies any urinary retention but has noticed some bowel incontinence. Stated that he has had increasing trouble walking and that he has lost weight. Last use of heroin was today (10/27).  10/28 - intubated 10/31 - extubated 11/03 - TEE 11/04 - s/p chest tube insertion for empyema and pneumothorax 11/17- Chest tube d/c 11/18 - s/p image guided aspiration/possible drainageofright psoas abscess 11/27- right psoas drain removed via IR  Patient is currently consuming 100% of meals (providing ~1300-1600 kcals per meal). Pt not taking Prostat, will d/c. Pt is drinking Ensure supplements as well. No further nutrition interventions needed at this time.  Admission weight: 140 lbs. Current weight: 110 lbs -last recorded weight from 11/18.   I/Os: -15L since 11/17 UOP:  1L x 24 hrs  Medications: Multivitamin with minerals daily Labs reviewed: CBGs: 80-81  Diet Order:   Diet Order            Diet regular Room service appropriate? Yes; Fluid consistency: Thin  Diet effective now              EDUCATION  NEEDS:   No education needs have been identified at this time  Skin:  Skin Assessment: Skin Integrity Issues: Skin Integrity Issues:: Stage I, DTI DTI: rt hip Stage I: sacrum  Last BM:  12/1 -type 1  Height:   Ht Readings from Last 1 Encounters:  03/15/19 6' 0.01" (1.829 m)    Weight:   Wt Readings from Last 1 Encounters:  03/15/19 49.9 kg    Ideal Body Weight:  80.9 kg  BMI:  Body mass index is 14.92 kg/m.  Estimated Nutritional Needs:   Kcal:  2100-2300  Protein:  100-120 grams  Fluid:  >/= 2.0 L  Clayton Bibles, MS, RD, LDN Inpatient Clinical Dietitian Pager: [phone] After Hours Pager: [phone]

## 2019-03-28 NOTE — Progress Notes (Signed)
PROGRESS NOTE    [name]   YIF:[id number]  DOB: 10/31/1970  DOA: 02/21/2019 PCP: Mack Hook, MD   Brief Narrative:  Franklin Anderson 48 y.o.malewith medical history significant of rheumatic fever, HTN, ongoingIV heroin abuse, nicotine abuse, presented to the ED on 10/27 with 3 week course of progressively worsening back pain and BLE weakness associated with some bowel incontinence and trouble walking. Last use of heroin was on the day of admission.  In ED > Found to have:sepsis with Tm 100, HR 126, RR 31, WBC 22.4k,AKI with BUN 111 and creat 1.7. Imaging studies revealed osteomyelitis and diskitis of L4-L5,septic pulmonary emboli and psoas abscesses. He was started on IV antibiotics and admitted to [facility].  On 10/28 was found to be obtunded and dyspneic and was intubated. Blood cultures from admission grew out MSSA. Subsequently found to have RV vegetation,  left sided empyema (MSSA) and  a left pneumothorax for which he received a chest tube.  Subjective:  Has a new pain today in his right hip today. Asking for IV pain medications.    Assessment & Plan:   Principal Problem:   Sepsis - MSSA bacteremia with L4/L5 osteomyelitis and discitis, epidural abscess, psoas abscess, septic pulmonary emboli, empyema and right ventricular mural vegetation - Right psoas abscess was aspirated on 10/28 and culture revealedMSSA. - Respiratory culture from 10/28 also grew MSSA. - 10/28 > 2D echo showed possible mitral valve vegetation and moderate pericardial effusion - 11/3> TEE-  There is a medium sized, mobile mass attached to the trabeculations in the RV and moderate sized pericardial effusion - per CT surgery, he was not a good candidate for thoracotomy - increasing pain on 11/17- MRI revealed increasing psoas abscesses- s/p drainage cath of right paraspinal abscess by IR 11/18- catheter was removed on 11/27  -ID plan>  cont Ancef through 12/16 followed by Keflex x 30 days - he  has ongoing complaints of pain - interestingly his site of pain is not consistent and changes on a daily basis from his left lumbar to right lumbar to right hip today -  Although he is likely drug seeking, I don't want to completely disregard his symptoms either this a repeat MRI was obtained on 11/30 - this actually show improvement in abscesses and size of phlegmon with ongoing (and expected) discitis and osteomyelitis - I have explained that we will not be increasing his narcotics but will need to try to wean him down prior to discharge   Active Problems:   Acute hypoxic respiratory failure - due to MSSA infection with septic pulmonary emboli, left empyema and left bronchopleural fistula (hydropneumothorax) - intubated on 10/28- 10/31  - 10/28 Bronchoscopy with BAL  - s/p left chest tube- removed on 11/17 - now on room air  Moderate Pericardial effusion - treated conservatively and improving- effusion was mild per last  ECHO on 11/9  HTN - cont Norvasc and Metoprolol     Acute metabolic encephalopathy - due to infection - has resolved   Severe protein calorie malnutrition - cont dietary supplements  HCV + - per ID, seems to have cleared   Time spent in minutes: 35 DVT prophylaxis: Lovenox Code Status: Full code Family Communication:  Disposition Plan: home vs SNF when IV antibiotics completed Consultants:   PCCM, cardiology, ID, palliative care, cardiothoracic surgery Antimicrobials:  Anti-infectives (From admission, onward)   Start     Dose/Rate Route Frequency Ordered Stop   02/24/19 2200  vancomycin (VANCOCIN) IVPB 1000  mg/200 mL premix  Status:  Discontinued     1,000 mg 200 mL/hr over 60 Minutes Intravenous Every 12 hours 02/24/19 1010 02/24/19 1253   02/24/19 1600  ceFAZolin (ANCEF) IVPB 2g/100 mL premix     2 g 200 mL/hr over 30 Minutes Intravenous Every 8 hours 02/24/19 1253 04/12/19 2359   02/23/19 2200  vancomycin (VANCOCIN) IVPB 750 mg/150 ml premix   Status:  Discontinued     750 mg 150 mL/hr over 60 Minutes Intravenous Every 24 hours 02/23/19 0853 02/23/19 0909   02/23/19 2200  vancomycin (VANCOCIN) IVPB 750 mg/150 ml premix  Status:  Discontinued     750 mg 150 mL/hr over 60 Minutes Intravenous Every 12 hours 02/23/19 0909 02/24/19 1010   02/22/19 2200  vancomycin (VANCOCIN) IVPB 750 mg/150 ml premix  Status:  Discontinued     750 mg 150 mL/hr over 60 Minutes Intravenous Every 24 hours 02/22/19 0826 02/22/19 0827   02/22/19 2200  vancomycin (VANCOCIN) IVPB 1000 mg/200 mL premix  Status:  Discontinued     1,000 mg 200 mL/hr over 60 Minutes Intravenous Every 24 hours 02/22/19 0827 02/23/19 0853   02/22/19 0900  ceFEPIme (MAXIPIME) 2 g in sodium chloride 0.9 % 100 mL IVPB  Status:  Discontinued     2 g 200 mL/hr over 30 Minutes Intravenous Every 12 hours 02/22/19 0826 02/23/19 1135   02/21/19 2015  ceFEPIme (MAXIPIME) 2 g in sodium chloride 0.9 % 100 mL IVPB     2 g 200 mL/hr over 30 Minutes Intravenous  Once 02/21/19 2013 02/22/19 0031   02/21/19 2015  metroNIDAZOLE (FLAGYL) IVPB 500 mg     500 mg 100 mL/hr over 60 Minutes Intravenous  Once 02/21/19 2013 02/22/19 0031   02/21/19 2015  vancomycin (VANCOCIN) IVPB 1000 mg/200 mL premix     1,000 mg 200 mL/hr over 60 Minutes Intravenous  Once 02/21/19 2013 02/22/19 0200       Objective: Vitals:   03/27/19 0626 03/27/19 1324 03/27/19 2153 03/28/19 0437  BP: (!) 137/96 (!) 134/96 (!) 132/96 (!) 146/94  Pulse: 84 96 (!) 103 87  Resp: _0 Temp: (!) 97.5 F (36.4 C) 97.9 F (36.6 C) 97.7 F (36.5 C) 97.8 F (36.6 C)  TempSrc: Oral  Oral Oral  SpO2: 93% 99% 98% 99%  Weight:      Height:        Intake/Output Summary (Last 24 hours) at 03/28/2019 1342 Last data filed at 03/28/2019 0900 Gross per 24 hour  Intake 950 ml  Output 1000 ml  Net -50 ml   Filed Weights   03/14/19 0500 03/15/19 0522 03/15/19 2000  Weight: 49.8 kg 49.9 kg 49.9 kg    Examination: General  exam: Appears comfortable  HEENT: PERRLA, oral mucosa moist, no sclera icterus or thrush Respiratory system: Clear to auscultation. Respiratory effort normal. Cardiovascular system: S1 & S2 heard,  No murmurs  Gastrointestinal system: Abdomen soft, non-tender, nondistended. Normal bowel sounds   Central nervous system: Alert and oriented. No focal neurological deficits. MSK: tender in right hip bone today Extremities: No cyanosis, clubbing or edema Skin: No rashes or ulcers Psychiatry:  Mood & affect appropriate.    Data Reviewed: I have personally reviewed following labs and imaging studies  CBC: Recent Labs  Lab 03/26/19 0915  WBC 7.9  HGB 10.4*  HCT 34.4*  MCV 97.2  PLT 827*   Basic Metabolic Panel: Recent Labs  Lab 03/26/19 0915  NA  138  K 3.8  CL 102  CO2 28  GLUCOSE 124*  BUN 20  CREATININE 0.45*  CALCIUM 9.0   GFR: Estimated Creatinine Clearance: 78.8 mL/min (A) (by C-G formula based on SCr of 0.45 mg/dL (L)). Liver Function Tests: Recent Labs  Lab 03/26/19 0915  AST 18  ALT 15  ALKPHOS 104  BILITOT 0.3  PROT 7.5  ALBUMIN 2.5*   No results for input(s): LIPASE, AMYLASE in the last 168 hours. No results for input(s): AMMONIA in the last 168 hours. Coagulation Profile: No results for input(s): INR, PROTIME in the last 168 hours. Cardiac Enzymes: No results for input(s): CKTOTAL, CKMB, CKMBINDEX, TROPONINI in the last 168 hours. BNP (last 3 results) No results for input(s): PROBNP in the last 8760 hours. HbA1C: No results for input(s): HGBA1C in the last 72 hours. CBG: Recent Labs  Lab 03/27/19 1152 03/27/19 1651 03/27/19 2149 03/28/19 0728 03/28/19 1130  GLUCAP 123* 117* 276* 81 80   Lipid Profile: No results for input(s): CHOL, HDL, LDLCALC, TRIG, CHOLHDL, LDLDIRECT in the last 72 hours. Thyroid Function Tests: No results for input(s): TSH, T4TOTAL, FREET4, T3FREE, THYROIDAB in the last 72 hours. Anemia Panel: No results for input(s):  VITAMINB12, FOLATE, FERRITIN, TIBC, IRON, RETICCTPCT in the last 72 hours. Urine analysis:    Component Value Date/Time   BILIRUBINUR neg 12/21/2017 1551   PROTEINUR Negative 12/21/2017 1551   UROBILINOGEN 0.2 12/21/2017 1551   NITRITE neg 12/21/2017 1551   LEUKOCYTESUR Negative 12/21/2017 1551   Sepsis Labs: _0 (procalcitonin:4,lacticidven:4) )No results found for this or any previous visit (from the past 240 hour(s)).       Radiology Studies: Mr Lumbar Spine W Wo Contrast  Result Date: 03/27/2019 CLINICAL DATA:  Back pain, infection. EXAM: MRI LUMBAR SPINE WITHOUT AND WITH CONTRAST TECHNIQUE: Multiplanar and multiecho pulse sequences of the lumbar spine were obtained without and with intravenous contrast. CONTRAST:  34m GADAVIST GADOBUTROL 1 MMOL/ML IV SOLN COMPARISON:  CT scan 03/20/2019 MRI from 03/14/2019 FINDINGS: Segmentation: The lowest lumbar type non-rib-bearing vertebra is labeled as L5. Alignment:  No vertebral subluxation is observed. Vertebrae: Again observed discitis osteomyelitis at the L4-5 level with endplate edema and sclerosis, and irregular rim enhancing collections along the anterior intervertebral disc space. There is bilateral facet edema and enhancement at this level, and septic arthritis of the facet joints is not excluded given the appearance. Endplate enhancement and type 1 degenerative endplate findings at LD1-7 with subtle accentuated T2 signal in the L2-3 intervertebral disc, a second site of early discitis is likely. Stable 6 mm focus of T2 hyperintensity in the T12 vertebral body, probably a small hemangioma or similar benign lesion. Left eccentric type 1 degenerative endplate findings at TO16-07 Conus medullaris and cauda equina: Conus extends to the L1-2. Paraspinal and other soft tissues: Small abscesses are present along the right psoas muscle with surrounding myositis. This includes a 0.9 by 1.0 by 2.0 cm abscess on image 34/6 anteriorly, and a 0.9  cm posterior right psoas abscess on image 30/6. Paraspinal phlegmon is most prominent at the L4-5 level and surrounds the vertebral level, and also at the L2-3 level and adjacent levels up to T12. The previous enhancing epidural phlegmon at the L4-5 level is less striking than on the prior exam. There is abnormal enhancement extending in the L4-5 and to a lesser extent L5-S1 neural foramina bilaterally. I do not see a well-defined epidural abscess. Disc levels: T12-L1: Unremarkable. L1-2: No impingement. L2-3: Borderline left subarticular lateral  recess stenosis due to left eccentric intervertebral spurring and facet spurring. L3-4: Mild left subarticular lateral recess stenosis and mild left foraminal stenosis due to left lateral recess and foraminal disc protrusion, disc bulge, and mild facet arthropathy. L4-5: At least mild bilateral foraminal stenosis due to facet and intervertebral spurring. L5-S1: Borderline bilateral foraminal stenosis due to disc bulge, intervertebral spurring, and facet arthropathy. IMPRESSION: 1. Active discitis-osteomyelitis at L4-5 and L2-3, with small right psoas muscle abscesses (reduced in size from prior exam) and considerable paraspinal phlegmon tracking between T12 L5. The amount of anterior epidural phlegmon at the L4-5 level is improved, but there is continued abnormal enhancement in both neural foramina at L4-5 and L5-S1. In addition, the facet joints at L4-5 demonstrate severe arthropathy and septic facet joints are not excluded. 2. Lumbar spondylosis and degenerative disc disease with mild impingement at L3-4 and L4-5. Electronically Signed   By: Van Clines M.D.   On: 03/27/2019 18:48      Scheduled Meds:  amLODipine  5 mg Oral Daily   Chlorhexidine Gluconate Cloth  6 each Topical Daily   cyclobenzaprine  5 mg Oral TID   diclofenac Sodium  2 g Topical QID   enoxaparin (LOVENOX) injection  40 mg Subcutaneous Q24H   feeding supplement (ENSURE ENLIVE)   237 mL Oral BID BM   feeding supplement (PRO-STAT SUGAR FREE 64)  30 mL Oral TID   fentaNYL  1 patch Transdermal Q72H   ketorolac  30 mg Intravenous Q6H   lidocaine  1 patch Transdermal Q24H   mouth rinse  15 mL Mouth Rinse BID   metoprolol tartrate  100 mg Oral BID   multivitamin with minerals  1 tablet Oral Daily   pantoprazole  40 mg Oral Daily   polyethylene glycol  17 g Oral Daily   Continuous Infusions:  sodium chloride 250 mL (03/07/19 2139)    ceFAZolin (ANCEF) IV 2 g (03/28/19 0534)     LOS: 34 days      Debbe Odea, MD [facility] Pager: [email or web address] Password [email or web address] 03/28/2019, 1:42 PM

## 2019-03-28 NOTE — TOC Progression Note (Signed)
Transition of Care [facility]) - Progression Note    Patient Details  Name: Franklin Anderson MRN: [id number] Date of Birth: 12/27/1970  Transition of Care [facility]) CM/SW [place], [place] Phone Number: 03/28/2019, 2:18 PM  Clinical Narrative:  Checked in with patient, who is wanting to move on to the next facility.  He feels stuck here.  I called Bryson Ha at [facility] and asked her if transfer was possible this week.  She stated she would again reach out to leadership to determine if this is possible. TOC will continue to follow during the course of hospitalization.      Expected Discharge Plan: Home/Self Care Barriers to Discharge: No Barriers Identified  Expected Discharge Plan and Services Expected Discharge Plan: Home/Self Care In-house Referral: Clinical Social Work     Living arrangements for the past 2 months: Single Family Home                                       Social Determinants of Health (SDOH) Interventions    Readmission Risk Interventions No flowsheet data found.

## 2019-03-29 ENCOUNTER — Inpatient Hospital Stay (HOSPITAL_COMMUNITY): Payer: Self-pay

## 2019-03-29 LAB — GLUCOSE, CAPILLARY
Glucose-Capillary: 116 mg/dL — ABNORMAL HIGH (ref 70–99)
Glucose-Capillary: 147 mg/dL — ABNORMAL HIGH (ref 70–99)
Glucose-Capillary: 125 mg/dL — ABNORMAL HIGH (ref 70–99)

## 2019-03-29 IMAGING — DX DG CHEST 1V PORT
1 series · 2 of 2 positions shown · non-contrast
Comparison: [DATE]

CLINICAL DATA: Fever, heroin abuse

EXAM:
PORTABLE CHEST 1 VIEW

[Series 1: chest ap · 0.14mm/px · 2 of 2 slices shown]
[im 1/2]
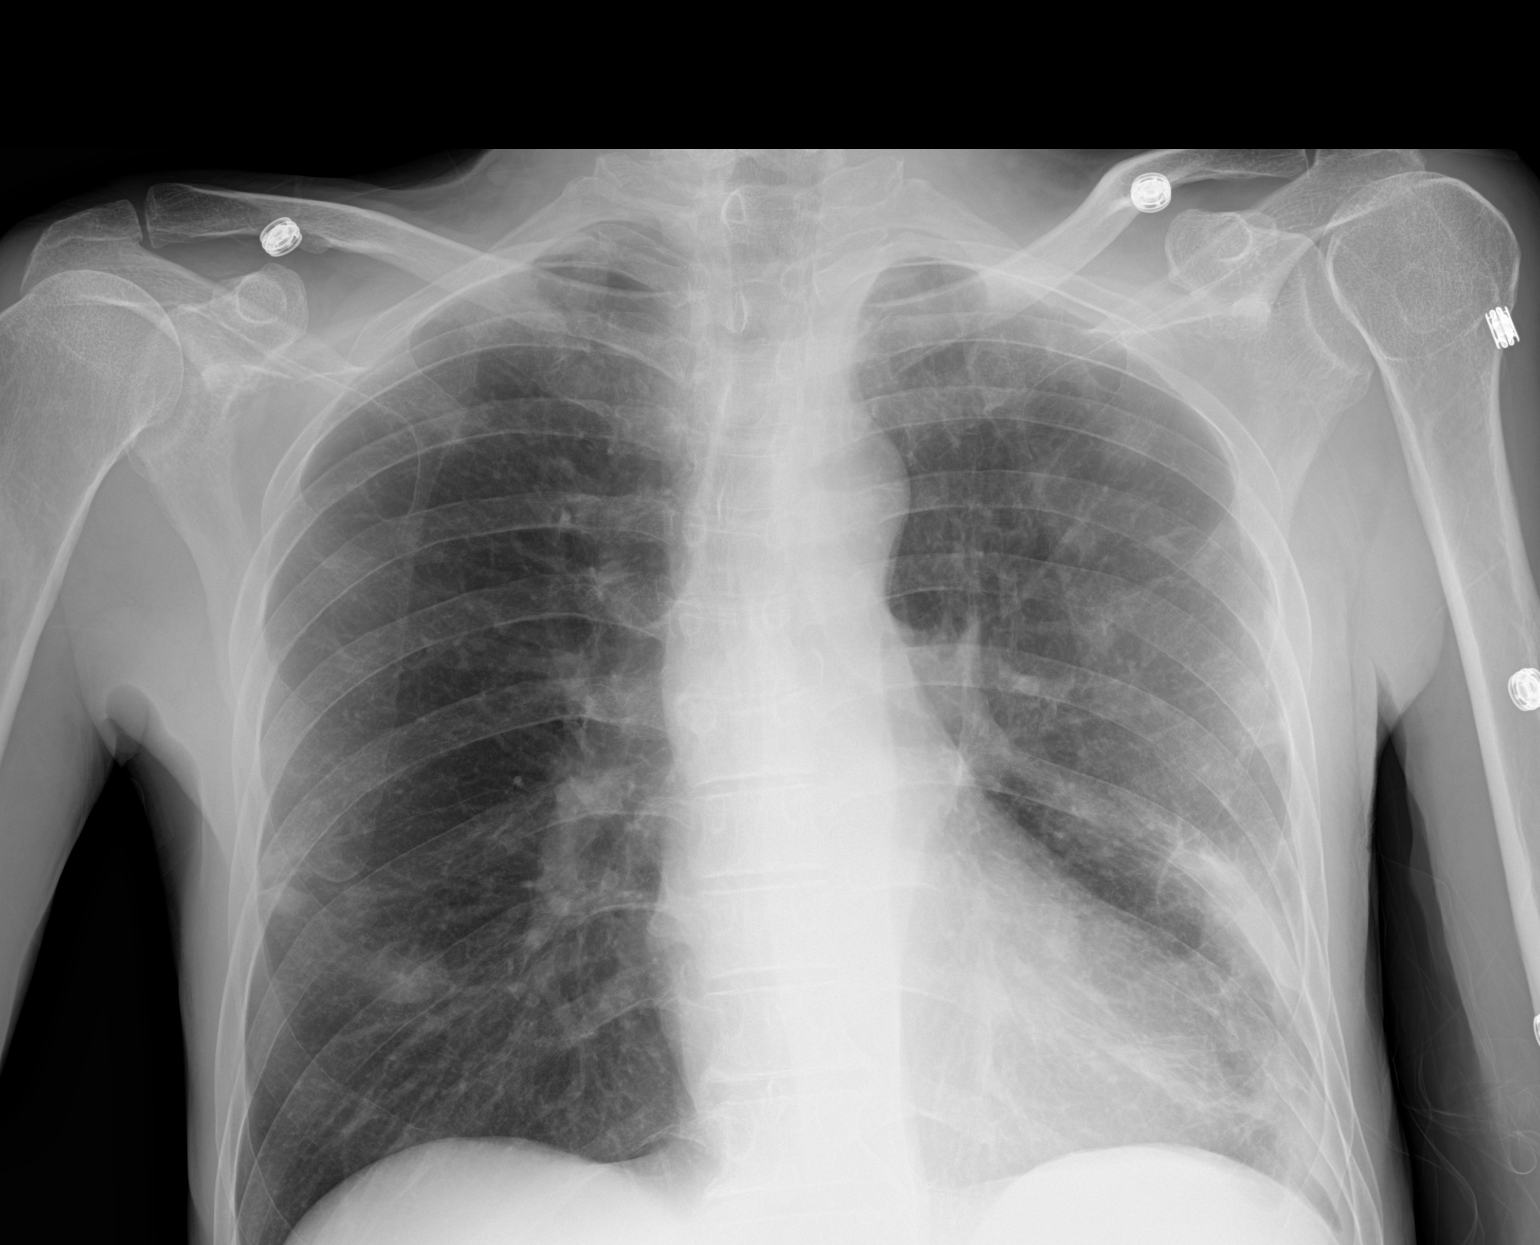
[im 2/2]
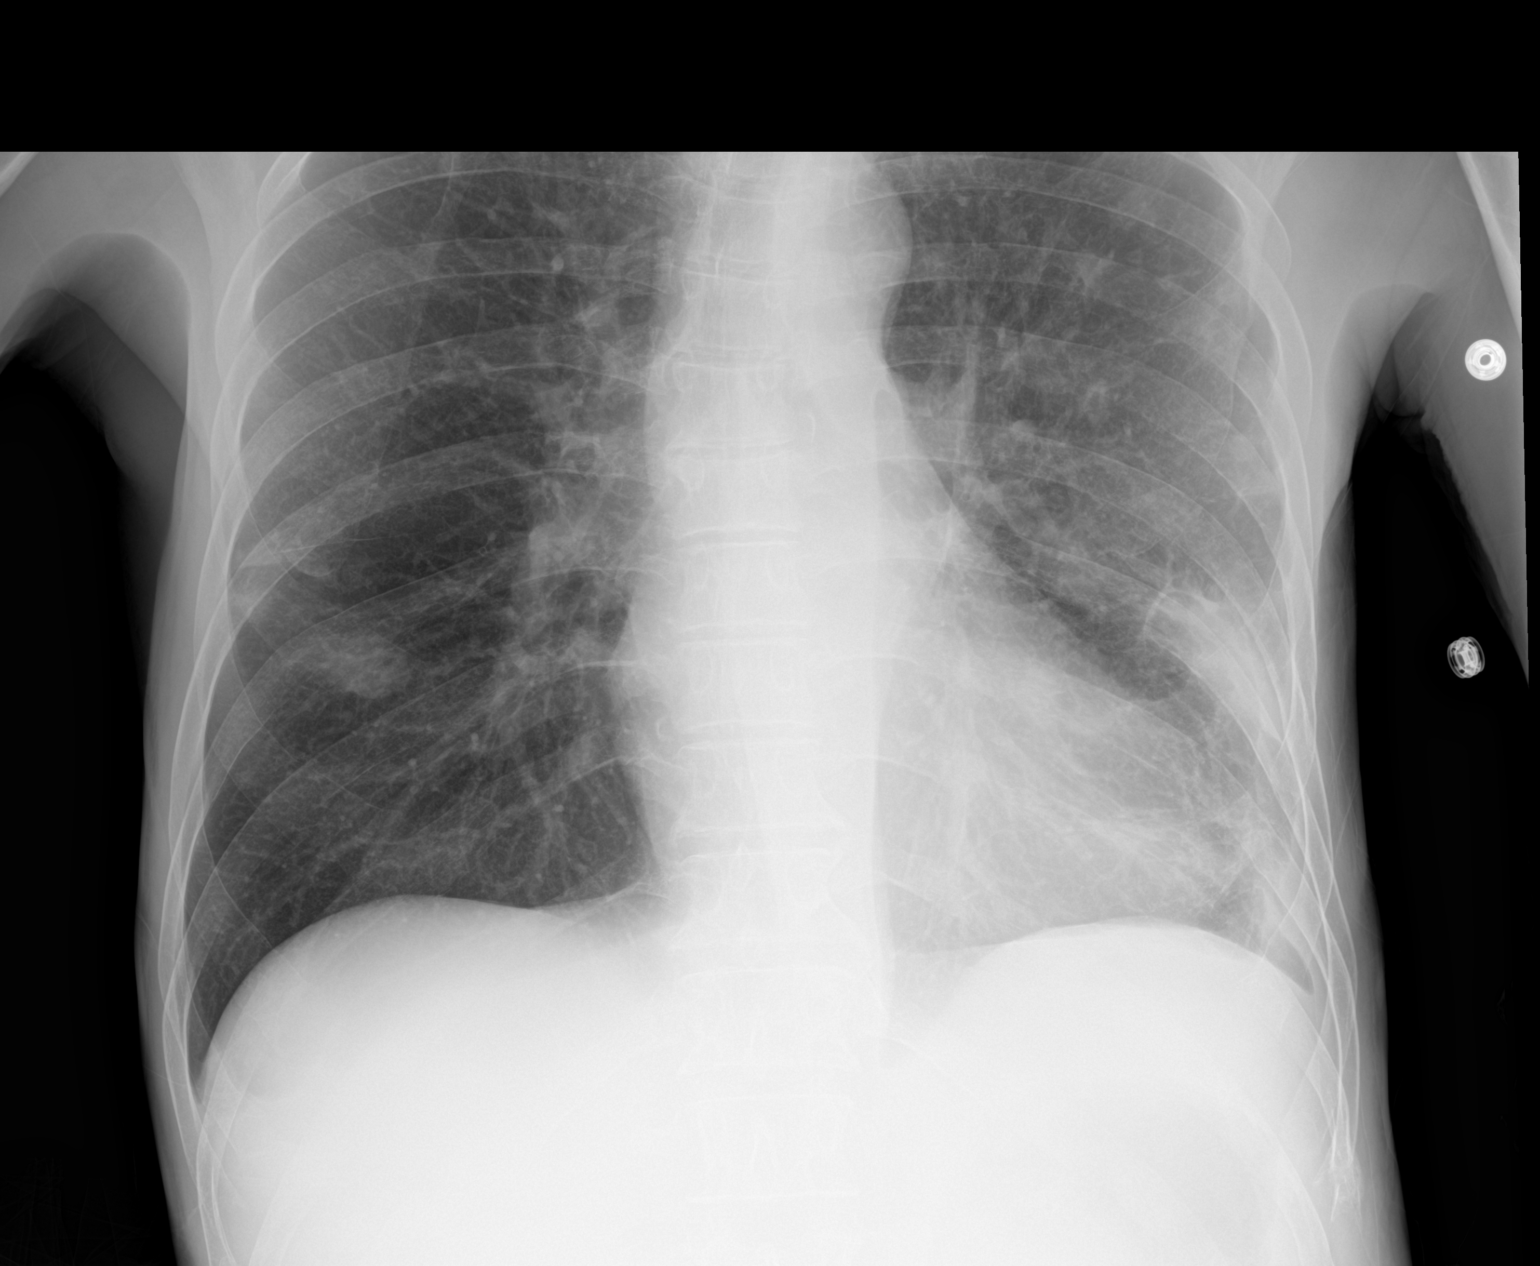

[2 of 2 positions shown; findings below may reference images not displayed]

FINDINGS: Multiple bilateral pulmonary nodules which have improved compared
with the prior examination of [DATE]. No new focal
consolidation. No pleural effusion. Small residual left apical
pneumothorax. Stable cardiomediastinal silhouette. No aggressive
osseous lesion.
IMPRESSION: 1. Bilateral pulmonary nodules which are improved compared with the
prior examination of [DATE]. Small residual left apical
pneumothorax.

## 2019-03-29 MED ORDER — CYCLOBENZAPRINE HCL 5 MG PO TABS
7.5000 mg | ORAL_TABLET | Freq: Three times a day (TID) | ORAL | Status: DC
  Administered 2019-03-29 – 2019-04-03 (×17): 7.5 mg via ORAL
  Filled 2019-03-29 (×17): qty 2

## 2019-03-29 NOTE — Progress Notes (Signed)
PT Cancellation Note  Patient Details Name: Franklin Anderson MRN: [id number] DOB: Apr 20, 1971   Cancelled Treatment:    Reason Eval/Treat Not Completed: Patient declined, no reason specified;Pain limiting ability to participate(pt reporting severe increase in pain in Rt hip and states he cannot participate in therapy with this level of pain. He wants to get pain medicine first and see if it helps at all and he is waiting for his MD to assess his hip.) Will follow up at later date/time as pt is able and schedule allows.   Kipp Brood, PT, DPT Physical Therapist with [facility]  03/29/2019 11:42 AM

## 2019-03-29 NOTE — Progress Notes (Signed)
OT Cancellation Note  Patient Details Name: WESLY WHISENANT MRN: [id number] DOB: October 26, 1970   Cancelled Treatment:    Reason Eval/Treat Not Completed: Pain limiting ability to participate; PT just checked in. Will check back, likely another day  Kimya Mccahill 03/29/2019, 11:52 AM  Lesle Chris, OTR/L Acute Rehabilitation Services [phone] [facility] pager [phone] office 03/29/2019

## 2019-03-29 NOTE — Progress Notes (Signed)
PROGRESS NOTE    Franklin Anderson   IOX:[id number]  DOB: [birth year]  DOA: 02/21/2019 PCP: Mack Hook, MD   Brief Narrative:  Franklin Anderson 47 y.o.malewith medical history significant of rheumatic fever, HTN, ongoingIV heroin abuse, nicotine abuse, presented to the ED on 10/27 with 3 week course of progressively worsening back pain and BLE weakness associated with some bowel incontinence and trouble walking. Last use of heroin was on the day of admission.  In ED > Found to have:sepsis with Tm 100, HR 126, RR 31, WBC 22.4k,AKI with BUN 111 and creat 1.7. Imaging studies revealed osteomyelitis and diskitis of L4-L5,septic pulmonary emboli and psoas abscesses. He was started on IV antibiotics and admitted to [facility].  On 10/28 was found to be obtunded and dyspneic and was intubated. Blood cultures from admission grew out MSSA. Subsequently found to have RV vegetation,  left sided empyema (MSSA) and  a left pneumothorax for which he received a chest tube.  Subjective: He continues to have right hip pain today and wants more pain medications.     Assessment & Plan:   Principal Problem:   Sepsis - MSSA bacteremia with L4/L5 osteomyelitis and discitis, epidural abscess, psoas abscess, septic pulmonary emboli, empyema and right ventricular mural vegetation - Right psoas abscess was aspirated on 10/28 and culture revealedMSSA. - Respiratory culture from 10/28 also grew MSSA. - 10/28 > 2D echo showed possible mitral valve vegetation and moderate pericardial effusion - 11/3> TEE-  There is a medium sized, mobile mass attached to the trabeculations in the RV and moderate sized pericardial effusion - per CT surgery, he was not a good candidate for thoracotomy - increasing pain on 11/17- MRI revealed increasing psoas abscesses- s/p drainage cath of right paraspinal abscess by IR 11/18- catheter was removed on 11/27  -ID plan>  cont Ancef through 12/16 followed by Keflex x 30 days - he  has ongoing complaints of pain - interestingly his site of pain is not consistent and changes on a daily basis from his left lumbar to right lumbar to right hip today -  Although he appears to be drug seeking, I don't want to completely disregard his symptoms, thus a repeat MRI was obtained on 11/30 - this actually show improvement in abscesses and size of phlegmon with ongoing (and expected) discitis and osteomyelitis - I have explained that we will not be increasing his narcotics but will need to try to wean him down prior to discharge - he continue to state that his right hip hurts today - this appears to be muscular- will increase his Flexeril today - have already added Voltaren gel   Active Problems:   Acute hypoxic respiratory failure - due to MSSA infection with septic pulmonary emboli, left empyema and left bronchopleural fistula (hydropneumothorax) - intubated on 10/28- 10/31  - 10/28 Bronchoscopy with BAL  - s/p left chest tube- removed on 11/17 - now on room air- f/u CXR ordered today  Moderate Pericardial effusion - treated conservatively and improving- effusion was mild per last  ECHO on 11/9  HTN - cont Norvasc and Metoprolol     Acute metabolic encephalopathy - due to infection - has resolved   Severe protein calorie malnutrition - cont dietary supplements  HCV + - per ID, seems to have cleared   Time spent in minutes: 35 DVT prophylaxis: Lovenox Code Status: Full code Family Communication:  Disposition Plan: home vs SNF when IV antibiotics completed Consultants:   PCCM, cardiology, ID, palliative  care, cardiothoracic surgery Antimicrobials:  Anti-infectives (From admission, onward)   Start     Dose/Rate Route Frequency Ordered Stop   02/24/19 2200  vancomycin (VANCOCIN) IVPB 1000 mg/200 mL premix  Status:  Discontinued     1,000 mg 200 mL/hr over 60 Minutes Intravenous Every 12 hours 02/24/19 1010 02/24/19 1253   02/24/19 1600  ceFAZolin (ANCEF) IVPB  2g/100 mL premix     2 g 200 mL/hr over 30 Minutes Intravenous Every 8 hours 02/24/19 1253 04/12/19 2359   02/23/19 2200  vancomycin (VANCOCIN) IVPB 750 mg/150 ml premix  Status:  Discontinued     750 mg 150 mL/hr over 60 Minutes Intravenous Every 24 hours 02/23/19 0853 02/23/19 0909   02/23/19 2200  vancomycin (VANCOCIN) IVPB 750 mg/150 ml premix  Status:  Discontinued     750 mg 150 mL/hr over 60 Minutes Intravenous Every 12 hours 02/23/19 0909 02/24/19 1010   02/22/19 2200  vancomycin (VANCOCIN) IVPB 750 mg/150 ml premix  Status:  Discontinued     750 mg 150 mL/hr over 60 Minutes Intravenous Every 24 hours 02/22/19 0826 02/22/19 0827   02/22/19 2200  vancomycin (VANCOCIN) IVPB 1000 mg/200 mL premix  Status:  Discontinued     1,000 mg 200 mL/hr over 60 Minutes Intravenous Every 24 hours 02/22/19 0827 02/23/19 0853   02/22/19 0900  ceFEPIme (MAXIPIME) 2 g in sodium chloride 0.9 % 100 mL IVPB  Status:  Discontinued     2 g 200 mL/hr over 30 Minutes Intravenous Every 12 hours 02/22/19 0826 02/23/19 1135   02/21/19 2015  ceFEPIme (MAXIPIME) 2 g in sodium chloride 0.9 % 100 mL IVPB     2 g 200 mL/hr over 30 Minutes Intravenous  Once 02/21/19 2013 02/22/19 0031   02/21/19 2015  metroNIDAZOLE (FLAGYL) IVPB 500 mg     500 mg 100 mL/hr over 60 Minutes Intravenous  Once 02/21/19 2013 02/22/19 0031   02/21/19 2015  vancomycin (VANCOCIN) IVPB 1000 mg/200 mL premix     1,000 mg 200 mL/hr over 60 Minutes Intravenous  Once 02/21/19 2013 02/22/19 0200       Objective: Vitals:   03/27/19 2153 03/28/19 0437 03/28/19 1505 03/29/19 0529  BP: (!) 132/96 (!) 146/94 133/84 (!) 140/103  Pulse: (!) 103 87 88 82  Resp: _0 Temp: 97.7 F (36.5 C) 97.8 F (36.6 C) 98.1 F (36.7 C) (!) 97.5 F (36.4 C)  TempSrc: Oral Oral Oral Oral  SpO2: 98% 99% 98% 99%  Weight:      Height:        Intake/Output Summary (Last 24 hours) at 03/29/2019 1151 Last data filed at 03/29/2019 1138 Gross per 24  hour  Intake 2414 ml  Output 2550 ml  Net -136 ml   Filed Weights   03/14/19 0500 03/15/19 0522 03/15/19 2000  Weight: 49.8 kg 49.9 kg 49.9 kg    Examination: General exam: Appears comfortable  HEENT: PERRLA, oral mucosa moist, no sclera icterus or thrush Respiratory system: Clear to auscultation. Respiratory effort normal. Cardiovascular system: S1 & S2 heard,  No murmurs  Gastrointestinal system: Abdomen soft, non-tender, nondistended. Normal bowel sounds   Central nervous system: Alert and oriented. No focal neurological deficits. Extremities: No cyanosis, clubbing or edema MSK- tender across lower back and in right hip and upper thigh Skin: No rashes or ulcers Psychiatry:  Mood & affect appropriate.    Data Reviewed: I have personally reviewed following labs and imaging studies  CBC:  Recent Labs  Lab 03/26/19 0915  WBC 7.9  HGB 10.4*  HCT 34.4*  MCV 97.2  PLT 865*   Basic Metabolic Panel: Recent Labs  Lab 03/26/19 0915  NA 138  K 3.8  CL 102  CO2 28  GLUCOSE 124*  BUN 20  CREATININE 0.45*  CALCIUM 9.0   GFR: Estimated Creatinine Clearance: 78.8 mL/min (A) (by C-G formula based on SCr of 0.45 mg/dL (L)). Liver Function Tests: Recent Labs  Lab 03/26/19 0915  AST 18  ALT 15  ALKPHOS 104  BILITOT 0.3  PROT 7.5  ALBUMIN 2.5*   No results for input(s): LIPASE, AMYLASE in the last 168 hours. No results for input(s): AMMONIA in the last 168 hours. Coagulation Profile: No results for input(s): INR, PROTIME in the last 168 hours. Cardiac Enzymes: No results for input(s): CKTOTAL, CKMB, CKMBINDEX, TROPONINI in the last 168 hours. BNP (last 3 results) No results for input(s): PROBNP in the last 8760 hours. HbA1C: No results for input(s): HGBA1C in the last 72 hours. CBG: Recent Labs  Lab 03/28/19 1130 03/28/19 1654 03/28/19 2206 03/29/19 0753 03/29/19 1136  GLUCAP 80 107* 96 116* 147*   Lipid Profile: No results for input(s): CHOL, HDL,  LDLCALC, TRIG, CHOLHDL, LDLDIRECT in the last 72 hours. Thyroid Function Tests: No results for input(s): TSH, T4TOTAL, FREET4, T3FREE, THYROIDAB in the last 72 hours. Anemia Panel: No results for input(s): VITAMINB12, FOLATE, FERRITIN, TIBC, IRON, RETICCTPCT in the last 72 hours. Urine analysis:    Component Value Date/Time   BILIRUBINUR neg 12/21/2017 1551   PROTEINUR Negative 12/21/2017 1551   UROBILINOGEN 0.2 12/21/2017 1551   NITRITE neg 12/21/2017 1551   LEUKOCYTESUR Negative 12/21/2017 1551   Sepsis Labs: _0 (procalcitonin:4,lacticidven:4) )No results found for this or any previous visit (from the past 240 hour(s)).       Radiology Studies: Mr Lumbar Spine W Wo Contrast  Result Date: 03/27/2019 CLINICAL DATA:  Back pain, infection. EXAM: MRI LUMBAR SPINE WITHOUT AND WITH CONTRAST TECHNIQUE: Multiplanar and multiecho pulse sequences of the lumbar spine were obtained without and with intravenous contrast. CONTRAST:  31m GADAVIST GADOBUTROL 1 MMOL/ML IV SOLN COMPARISON:  CT scan 03/20/2019 MRI from 03/14/2019 FINDINGS: Segmentation: The lowest lumbar type non-rib-bearing vertebra is labeled as L5. Alignment:  No vertebral subluxation is observed. Vertebrae: Again observed discitis osteomyelitis at the L4-5 level with endplate edema and sclerosis, and irregular rim enhancing collections along the anterior intervertebral disc space. There is bilateral facet edema and enhancement at this level, and septic arthritis of the facet joints is not excluded given the appearance. Endplate enhancement and type 1 degenerative endplate findings at LH8-4 with subtle accentuated T2 signal in the L2-3 intervertebral disc, a second site of early discitis is likely. Stable 6 mm focus of T2 hyperintensity in the T12 vertebral body, probably a small hemangioma or similar benign lesion. Left eccentric type 1 degenerative endplate findings at TO96-29 Conus medullaris and cauda equina: Conus extends to  the L1-2. Paraspinal and other soft tissues: Small abscesses are present along the right psoas muscle with surrounding myositis. This includes a 0.9 by 1.0 by 2.0 cm abscess on image 34/6 anteriorly, and a 0.9 cm posterior right psoas abscess on image 30/6. Paraspinal phlegmon is most prominent at the L4-5 level and surrounds the vertebral level, and also at the L2-3 level and adjacent levels up to T12. The previous enhancing epidural phlegmon at the L4-5 level is less striking than on the prior exam. There is abnormal  enhancement extending in the L4-5 and to a lesser extent L5-S1 neural foramina bilaterally. I do not see a well-defined epidural abscess. Disc levels: T12-L1: Unremarkable. L1-2: No impingement. L2-3: Borderline left subarticular lateral recess stenosis due to left eccentric intervertebral spurring and facet spurring. L3-4: Mild left subarticular lateral recess stenosis and mild left foraminal stenosis due to left lateral recess and foraminal disc protrusion, disc bulge, and mild facet arthropathy. L4-5: At least mild bilateral foraminal stenosis due to facet and intervertebral spurring. L5-S1: Borderline bilateral foraminal stenosis due to disc bulge, intervertebral spurring, and facet arthropathy. IMPRESSION: 1. Active discitis-osteomyelitis at L4-5 and L2-3, with small right psoas muscle abscesses (reduced in size from prior exam) and considerable paraspinal phlegmon tracking between T12 L5. The amount of anterior epidural phlegmon at the L4-5 level is improved, but there is continued abnormal enhancement in both neural foramina at L4-5 and L5-S1. In addition, the facet joints at L4-5 demonstrate severe arthropathy and septic facet joints are not excluded. 2. Lumbar spondylosis and degenerative disc disease with mild impingement at L3-4 and L4-5. Electronically Signed   By: Van Clines M.D.   On: 03/27/2019 18:48      Scheduled Meds:  amLODipine  5 mg Oral Daily   Chlorhexidine  Gluconate Cloth  6 each Topical Daily   cyclobenzaprine  7.5 mg Oral TID   diclofenac Sodium  2 g Topical QID   enoxaparin (LOVENOX) injection  40 mg Subcutaneous Q24H   feeding supplement (ENSURE ENLIVE)  237 mL Oral BID BM   fentaNYL  1 patch Transdermal Q72H   ketorolac  30 mg Intravenous Q6H   lidocaine  1 patch Transdermal Q24H   mouth rinse  15 mL Mouth Rinse BID   metoprolol tartrate  100 mg Oral BID   multivitamin with minerals  1 tablet Oral Daily   pantoprazole  40 mg Oral Daily   polyethylene glycol  17 g Oral Daily   Continuous Infusions:  sodium chloride 250 mL (03/07/19 2139)    ceFAZolin (ANCEF) IV 2 g (03/29/19 0815)     LOS: 35 days      Debbe Odea, MD [facility] Pager: [email or web address] Password [facility] 03/29/2019, 11:51 AM

## 2019-03-29 NOTE — Progress Notes (Signed)
Pt has been having pain in his rt hip ( intermittant sharp shooting pain into femur) pain so bad he cant lay on his rt side or stand due to rt hip pain- would like to have the hip evaluated

## 2019-03-30 LAB — GLUCOSE, CAPILLARY
Glucose-Capillary: 85 mg/dL (ref 70–99)
Glucose-Capillary: 88 mg/dL (ref 70–99)
Glucose-Capillary: 136 mg/dL — ABNORMAL HIGH (ref 70–99)
Glucose-Capillary: 89 mg/dL (ref 70–99)

## 2019-03-30 NOTE — Progress Notes (Signed)
PROGRESS NOTE    Franklin Anderson   QQI:[id number]  DOB: Dec 24, 1970  DOA: 02/21/2019 PCP: Mack Hook, MD   Brief Narrative:  Franklin Anderson 48 y.o.malewith medical history significant of rheumatic fever, HTN, ongoingIV heroin abuse, nicotine abuse, presented to the ED on 10/27 with 3 week course of progressively worsening back pain and BLE weakness associated with some bowel incontinence and trouble walking. Last use of heroin was on the day of admission.  In ED > Found to have:sepsis with Tm 100, HR 126, RR 31, WBC 22.4k,AKI with BUN 111 and creat 1.7. Imaging studies revealed osteomyelitis and diskitis of L4-L5,septic pulmonary emboli and psoas abscesses. He was started on IV antibiotics and admitted to [facility].  On 10/28 was found to be obtunded and dyspneic and was intubated. Blood cultures from admission grew out MSSA. Subsequently found to have RV vegetation,  left sided empyema (MSSA) and  a left pneumothorax for which he received a chest tube.  Subjective:  Ongoing pain in right hip.   Assessment & Plan:   Principal Problem:   Sepsis - MSSA bacteremia with L4/L5 osteomyelitis and discitis, epidural abscess, psoas abscess, septic pulmonary emboli, empyema and right ventricular mural vegetation - Right psoas abscess was aspirated on 10/28 and culture revealedMSSA. - Respiratory culture from 10/28 also grew MSSA. - 10/28 > 2D echo showed possible mitral valve vegetation and moderate pericardial effusion - 11/3> TEE-  There is a medium sized, mobile mass attached to the trabeculations in the RV and moderate sized pericardial effusion - per CT surgery, he was not a good candidate for thoracotomy - increasing pain on 11/17- MRI revealed increasing psoas abscesses- s/p drainage cath of right paraspinal abscess by IR 11/18- catheter was removed on 11/27  -ID plan>  cont Ancef through 12/16 followed by Keflex x 30 days - he has ongoing complaints of pain - interestingly his  site of pain is not consistent and changes on a daily basis from left to right to hip to legs and then back to his back -  Although he appears to be drug seeking, I don't want to completely disregard his symptoms, thus a repeat MRI was obtained on 11/30 - this actually show improvement in abscesses and size of phlegmon with ongoing (and expected) discitis and osteomyelitis - I have explained that we will not be increasing his narcotics but will need to try to wean him down prior to discharge- he declines this and continues to ask for an increase in narcotics on a daily pasis - he continue to state that his right hip hurts today - on exam, his pain today is in her right lower back and the hip and thigh are non-tender today. Pain continues to appear to be muscular- I increases his Flexeril yesterday - have already added Voltaren gel- he continues to use his Lidocaine patch in areas which are painful.   Active Problems:   Acute hypoxic respiratory failure - due to MSSA infection with septic pulmonary emboli, left empyema and left bronchopleural fistula (hydropneumothorax) - intubated on 10/28- 10/31  - 10/28 Bronchoscopy with BAL  - s/p left chest tube- removed on 11/17 - now on room air-  - repeat CXR > Bilateral pulmonary nodules which are improved compared with the prior examination of 03/16/2019. Small residual left apical pneumothorax.  Moderate Pericardial effusion - treated conservatively and improving- effusion was mild per last  ECHO on 11/9  HTN - cont Norvasc and Metoprolol     Acute  metabolic encephalopathy - due to infection - has resolved   Severe protein calorie malnutrition - cont dietary supplements  HCV + - per ID, seems to have cleared   Time spent in minutes: 35 DVT prophylaxis: Lovenox Code Status: Full code Family Communication:  Disposition Plan: home vs SNF when IV antibiotics completed Consultants:   PCCM, cardiology, ID, palliative care, cardiothoracic  surgery Antimicrobials:  Anti-infectives (From admission, onward)   Start     Dose/Rate Route Frequency Ordered Stop   02/24/19 2200  vancomycin (VANCOCIN) IVPB 1000 mg/200 mL premix  Status:  Discontinued     1,000 mg 200 mL/hr over 60 Minutes Intravenous Every 12 hours 02/24/19 1010 02/24/19 1253   02/24/19 1600  ceFAZolin (ANCEF) IVPB 2g/100 mL premix     2 g 200 mL/hr over 30 Minutes Intravenous Every 8 hours 02/24/19 1253 04/12/19 2359   02/23/19 2200  vancomycin (VANCOCIN) IVPB 750 mg/150 ml premix  Status:  Discontinued     750 mg 150 mL/hr over 60 Minutes Intravenous Every 24 hours 02/23/19 0853 02/23/19 0909   02/23/19 2200  vancomycin (VANCOCIN) IVPB 750 mg/150 ml premix  Status:  Discontinued     750 mg 150 mL/hr over 60 Minutes Intravenous Every 12 hours 02/23/19 0909 02/24/19 1010   02/22/19 2200  vancomycin (VANCOCIN) IVPB 750 mg/150 ml premix  Status:  Discontinued     750 mg 150 mL/hr over 60 Minutes Intravenous Every 24 hours 02/22/19 0826 02/22/19 0827   02/22/19 2200  vancomycin (VANCOCIN) IVPB 1000 mg/200 mL premix  Status:  Discontinued     1,000 mg 200 mL/hr over 60 Minutes Intravenous Every 24 hours 02/22/19 0827 02/23/19 0853   02/22/19 0900  ceFEPIme (MAXIPIME) 2 g in sodium chloride 0.9 % 100 mL IVPB  Status:  Discontinued     2 g 200 mL/hr over 30 Minutes Intravenous Every 12 hours 02/22/19 0826 02/23/19 1135   02/21/19 2015  ceFEPIme (MAXIPIME) 2 g in sodium chloride 0.9 % 100 mL IVPB     2 g 200 mL/hr over 30 Minutes Intravenous  Once 02/21/19 2013 02/22/19 0031   02/21/19 2015  metroNIDAZOLE (FLAGYL) IVPB 500 mg     500 mg 100 mL/hr over 60 Minutes Intravenous  Once 02/21/19 2013 02/22/19 0031   02/21/19 2015  vancomycin (VANCOCIN) IVPB 1000 mg/200 mL premix     1,000 mg 200 mL/hr over 60 Minutes Intravenous  Once 02/21/19 2013 02/22/19 0200       Objective: Vitals:   03/29/19 1522 03/29/19 2059 03/30/19 0546 03/30/19 1342  BP: (!) 155/103 (!)  149/97 (!) 143/93 (!) 138/94  Pulse: 87 87 81 93  Resp: (!) _0 Temp: (!) 97.5 F (36.4 C) 97.8 F (36.6 C) 97.6 F (36.4 C) 97.6 F (36.4 C)  TempSrc: Oral Oral Oral Oral  SpO2: 99% 100% 97% 98%  Weight:      Height:        Intake/Output Summary (Last 24 hours) at 03/30/2019 1357 Last data filed at 03/30/2019 0900 Gross per 24 hour  Intake 240 ml  Output 1900 ml  Net -1660 ml   Filed Weights   03/14/19 0500 03/15/19 0522 03/15/19 2000  Weight: 49.8 kg 49.9 kg 49.9 kg    Examination: General exam: Appears comfortable  HEENT: PERRLA, oral mucosa moist, no sclera icterus or thrush Respiratory system: Clear to auscultation. Respiratory effort normal. Cardiovascular system: S1 & S2 heard,  No murmurs  Gastrointestinal system: Abdomen  soft, non-tender, nondistended. Normal bowel sounds   Central nervous system: Alert and oriented. No focal neurological deficits. Extremities: No cyanosis, clubbing or edema- tender in right lower back today Skin: No rashes or ulcers Psychiatry:  Mood & affect appropriate.   Data Reviewed: I have personally reviewed following labs and imaging studies  CBC: Recent Labs  Lab 03/26/19 0915  WBC 7.9  HGB 10.4*  HCT 34.4*  MCV 97.2  PLT 782*   Basic Metabolic Panel: Recent Labs  Lab 03/26/19 0915  NA 138  K 3.8  CL 102  CO2 28  GLUCOSE 124*  BUN 20  CREATININE 0.45*  CALCIUM 9.0   GFR: Estimated Creatinine Clearance: 78.8 mL/min (A) (by C-G formula based on SCr of 0.45 mg/dL (L)). Liver Function Tests: Recent Labs  Lab 03/26/19 0915  AST 18  ALT 15  ALKPHOS 104  BILITOT 0.3  PROT 7.5  ALBUMIN 2.5*   No results for input(s): LIPASE, AMYLASE in the last 168 hours. No results for input(s): AMMONIA in the last 168 hours. Coagulation Profile: No results for input(s): INR, PROTIME in the last 168 hours. Cardiac Enzymes: No results for input(s): CKTOTAL, CKMB, CKMBINDEX, TROPONINI in the last 168 hours. BNP (last 3  results) No results for input(s): PROBNP in the last 8760 hours. HbA1C: No results for input(s): HGBA1C in the last 72 hours. CBG: Recent Labs  Lab 03/29/19 0753 03/29/19 1136 03/29/19 1651 03/30/19 0744 03/30/19 1146  GLUCAP 116* 147* 125* 85 88   Lipid Profile: No results for input(s): CHOL, HDL, LDLCALC, TRIG, CHOLHDL, LDLDIRECT in the last 72 hours. Thyroid Function Tests: No results for input(s): TSH, T4TOTAL, FREET4, T3FREE, THYROIDAB in the last 72 hours. Anemia Panel: No results for input(s): VITAMINB12, FOLATE, FERRITIN, TIBC, IRON, RETICCTPCT in the last 72 hours. Urine analysis:    Component Value Date/Time   BILIRUBINUR neg 12/21/2017 1551   PROTEINUR Negative 12/21/2017 1551   UROBILINOGEN 0.2 12/21/2017 1551   NITRITE neg 12/21/2017 1551   LEUKOCYTESUR Negative 12/21/2017 1551   Sepsis Labs: _0 (procalcitonin:4,lacticidven:4) )No results found for this or any previous visit (from the past 240 hour(s)).       Radiology Studies: Dg Chest Port 1 View  Result Date: 03/29/2019 CLINICAL DATA:  Fever, heroin abuse EXAM: PORTABLE CHEST 1 VIEW COMPARISON:  03/16/2019 FINDINGS: Multiple bilateral pulmonary nodules which have improved compared with the prior examination of 03/16/2019. No new focal consolidation. No pleural effusion. Small residual left apical pneumothorax. Stable cardiomediastinal silhouette. No aggressive osseous lesion. IMPRESSION: 1. Bilateral pulmonary nodules which are improved compared with the prior examination of 03/16/2019. Small residual left apical pneumothorax. Electronically Signed   By: Kathreen Devoid   On: 03/29/2019 15:32      Scheduled Meds: . amLODipine  5 mg Oral Daily  . Chlorhexidine Gluconate Cloth  6 each Topical Daily  . cyclobenzaprine  7.5 mg Oral TID  . diclofenac Sodium  2 g Topical QID  . enoxaparin (LOVENOX) injection  40 mg Subcutaneous Q24H  . feeding supplement (ENSURE ENLIVE)  237 mL Oral BID BM  . fentaNYL   1 patch Transdermal Q72H  . ketorolac  30 mg Intravenous Q6H  . lidocaine  1 patch Transdermal Q24H  . mouth rinse  15 mL Mouth Rinse BID  . metoprolol tartrate  100 mg Oral BID  . multivitamin with minerals  1 tablet Oral Daily  . pantoprazole  40 mg Oral Daily  . polyethylene glycol  17 g Oral Daily  Continuous Infusions: . sodium chloride 250 mL (03/07/19 2139)  .  ceFAZolin (ANCEF) IV 2 g (03/30/19 1339)     LOS: 36 days      Debbe Odea, MD [facility] Pager: [email or web address] Password [email or web address] 03/30/2019, 1:57 PM

## 2019-03-30 NOTE — Progress Notes (Signed)
Physical Therapy Treatment Patient Details Name: Franklin Anderson MRN: [id number] DOB: 09/20/1970 Today's Date: 03/30/2019    History of Present Illness Sepsis - MSSA bacteremia with L4/L5 osteomyelitis and discitis, epidural abscess, psoas abscess, septic pulmonary emboli, empyema and right ventricular mural vegetation    PT Comments    Pt not progressing with his mobility and continue to c/o "intense" pain.  Now pain has travelled to his R hip. General bed mobility comments: increased time to sit partial EOB.  Increased R hip pain, pt unable to to com,e to full seated position.  Attempted twice.  "you know I want to try". Pt was unable to stand thus unable to attempt gait.  Returned to semisupine position in bed.     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  Increased R hip pain, pt unable to to com,e to full seated position.  Attempted twice.  "you know I want to try".  Transfers                 General transfer comment: unable due to increased R hip pain  Ambulation/Gait             General Gait Details: unable due to increased R hip pain   Stairs             Wheelchair Mobility    Modified Rankin (Stroke Patients Only)       Balance                                            Cognition Arousal/Alertness:  Awake/alert Behavior During Therapy: WFL for tasks assessed/performed Overall Cognitive Status: Within Functional Limits for tasks assessed                                 General Comments: pt agreeable to therpay today, pt complaining of pain throughout and unable to complete session      Exercises      General Comments        Pertinent Vitals/Pain Pain Assessment: 0-10 Pain Score: 10-Worst pain ever Pain Location: R hip Pain Descriptors / Indicators: Guarding;Grimacing;Moaning;Sharp Pain Intervention(s): Monitored during session;Premedicated before session;Repositioned    Home Living                      Prior Function            PT Goals (current goals can now be found in the care plan section) Progress towards PT goals: Progressing toward goals    Frequency    Min 2X/week      PT Plan Current plan remains appropriate    Co-evaluation  AM-PAC PT "6 Clicks" Mobility   Outcome Measure  Help needed turning from your back to your side while in a flat bed without using bedrails?: A Lot Help needed moving from lying on your back to sitting on the side of a flat bed without using bedrails?: A Lot Help needed moving to and from a bed to a chair (including a wheelchair)?: A Lot Help needed standing up from a chair using your arms (e.g., wheelchair or bedside chair)?: Total Help needed to walk in hospital room?: Total Help needed climbing 3-5 steps with a railing? : Total 6 Click Score: 9    End of Session   Activity Tolerance: Patient limited by pain Patient left: with call bell/phone within reach;in bed;with nursing/sitter in room Nurse Communication: Mobility status PT Visit Diagnosis: Muscle weakness (generalized) (M62.81);Difficulty in walking, not elsewhere classified (R26.2)     Time: 9242-6834 PT Time Calculation (min) (ACUTE ONLY): 19 min  Charges:  $Gait Training: 8-22 mins                     {Denicia Pagliarulo  PTA Acute  Rehabilitation Services Pager      [phone] Office      [phone]

## 2019-03-31 ENCOUNTER — Encounter (HOSPITAL_COMMUNITY): Payer: Self-pay | Admitting: Radiology

## 2019-03-31 ENCOUNTER — Inpatient Hospital Stay (HOSPITAL_COMMUNITY): Payer: Self-pay

## 2019-03-31 LAB — BASIC METABOLIC PANEL
Anion gap: 12 (ref 5–15)
BUN: 18 mg/dL (ref 6–20)
CO2: 25 mmol/L (ref 22–32)
Calcium: 8.5 mg/dL — ABNORMAL LOW (ref 8.9–10.3)
Chloride: 100 mmol/L (ref 98–111)
Creatinine, Ser: 0.51 mg/dL — ABNORMAL LOW (ref 0.61–1.24)
GFR calc Af Amer: >60 mL/min (ref >60)
GFR calc non Af Amer: >60 mL/min (ref >60)
Glucose, Bld: 166 mg/dL — ABNORMAL HIGH (ref 70–99)
Potassium: 3.7 mmol/L (ref 3.5–5.1)
Sodium: 137 mmol/L (ref 135–145)

## 2019-03-31 LAB — GLUCOSE, CAPILLARY
Glucose-Capillary: 96 mg/dL (ref 70–99)
Glucose-Capillary: 109 mg/dL — ABNORMAL HIGH (ref 70–99)
Glucose-Capillary: 100 mg/dL — ABNORMAL HIGH (ref 70–99)
Glucose-Capillary: 131 mg/dL — ABNORMAL HIGH (ref 70–99)

## 2019-03-31 IMAGING — CT CT HIP*R* W/O CM
3 of 6 series · 14 of 46 positions shown, 19 images · non-contrast
Comparison: Pelvic CT [DATE].

CLINICAL DATA: Severe right hip pain for 5 days. No known injury.
Suspected hip fracture.

EXAM:
CT OF THE RIGHT HIP WITHOUT CONTRAST
TECHNIQUE: Multidetector CT imaging of the right hip was performed according to
the standard protocol. Multiplanar CT image reconstructions were
also generated.

[Series 4: axial st · axial · 0.46mm/px · z∈[-740,-538]mm · 12 of 117 slices shown, 17 images]
[im 8/117  soft-tissue]
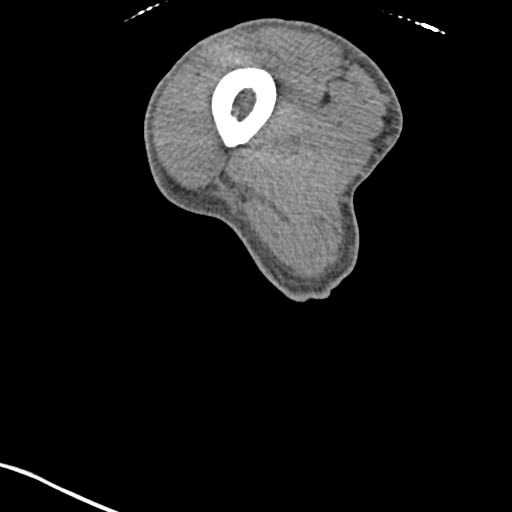
[im 8/117  bone]
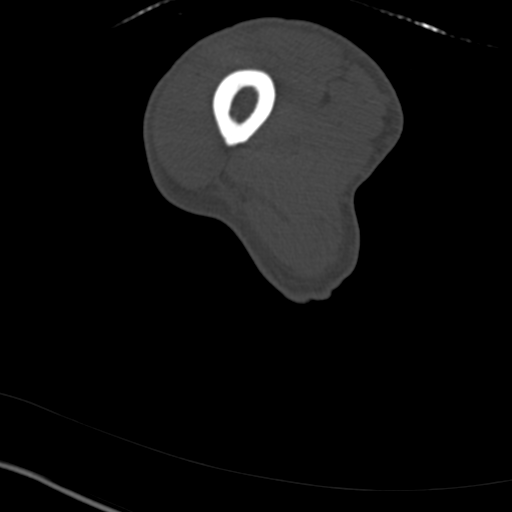
[im 16/117  soft-tissue]
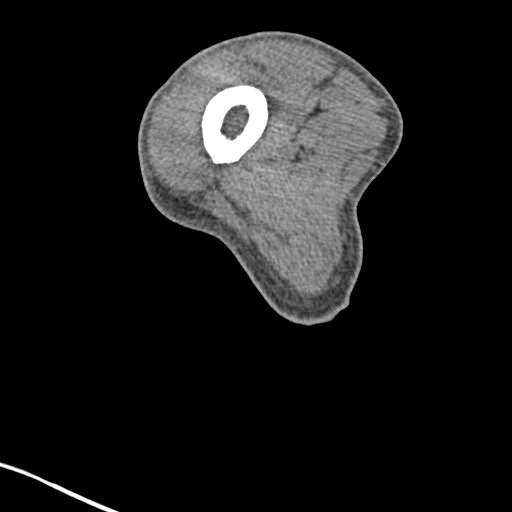
[im 31/117  soft-tissue]
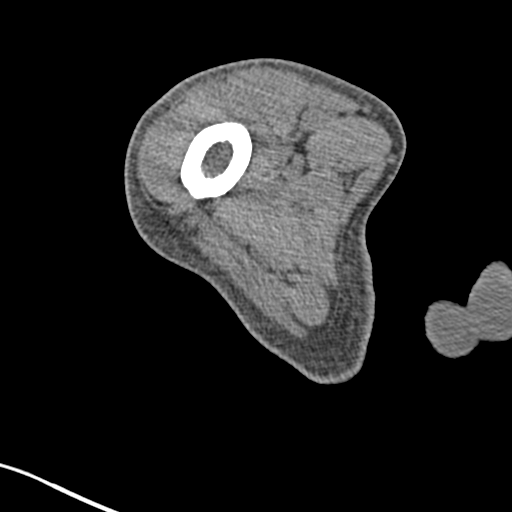
[im 39/117  soft-tissue]
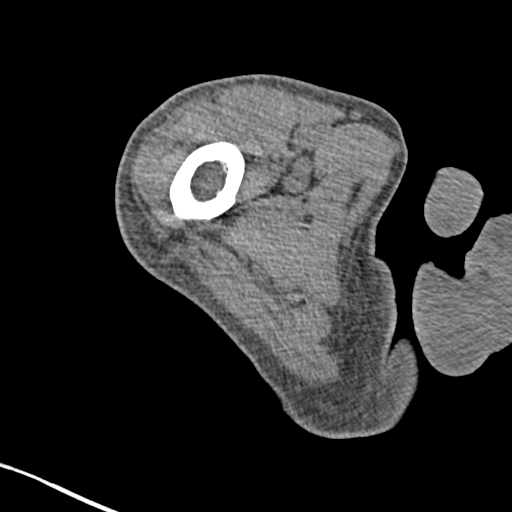
[im 47/117  soft-tissue]
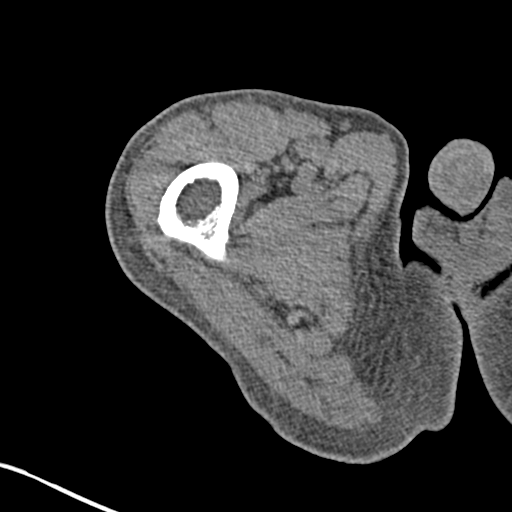
[im 62/117  soft-tissue]
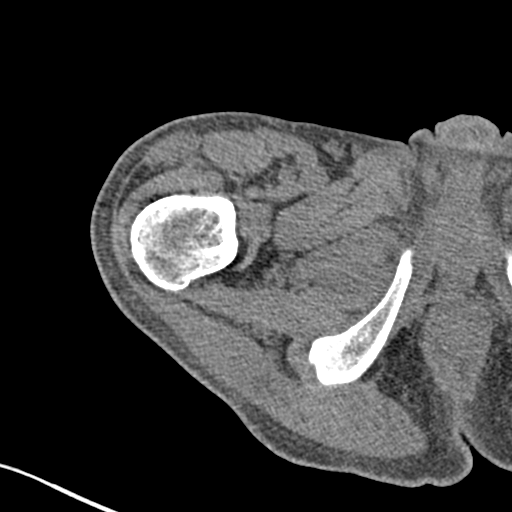
[im 70/117  soft-tissue]
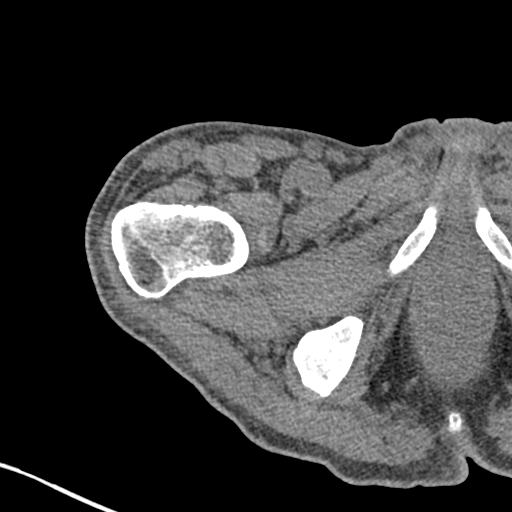
[im 78/117  soft-tissue]
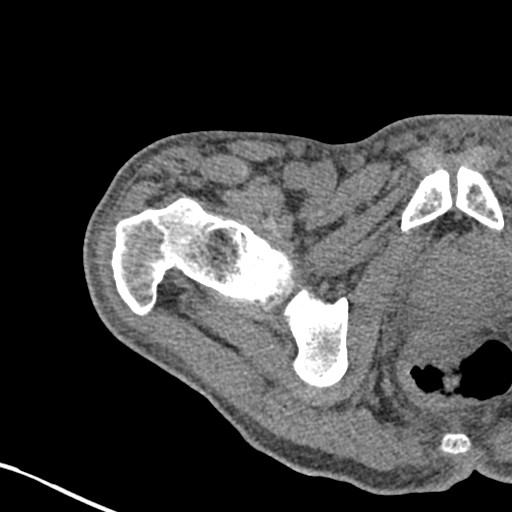
[im 86/117  soft-tissue]
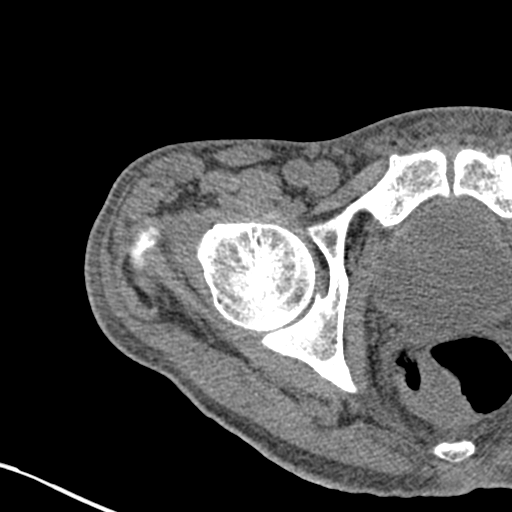
[im 86/117  lung]
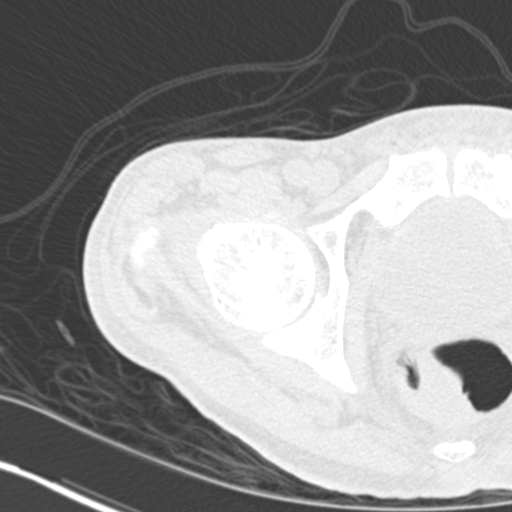
[im 86/117  bone]
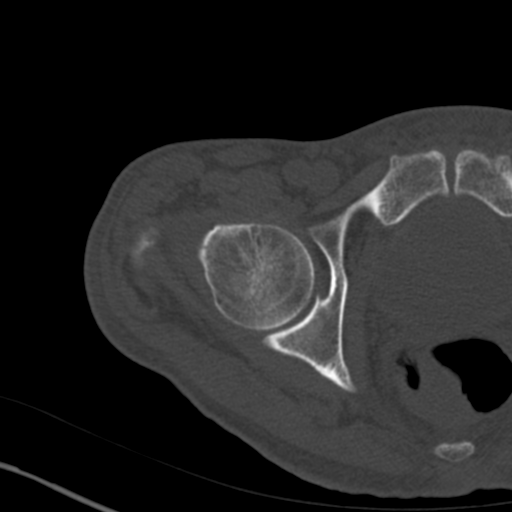
[im 93/117  lung]
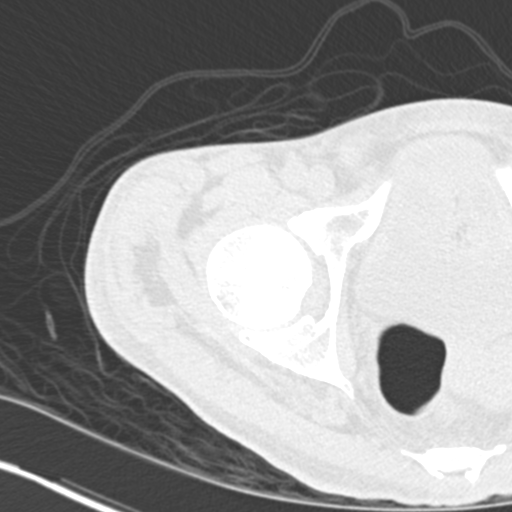
[im 101/117  soft-tissue]
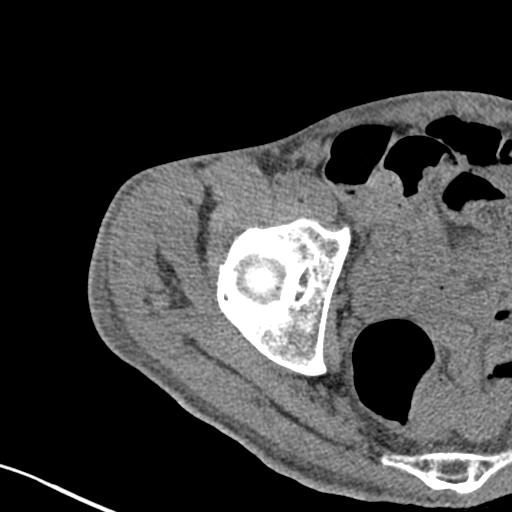
[im 101/117  lung]
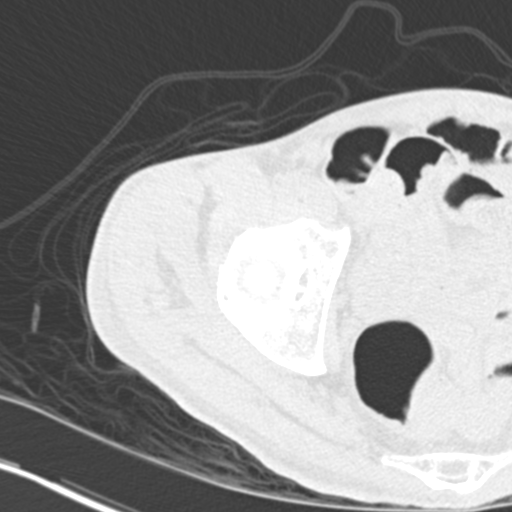
[im 109/117  soft-tissue]
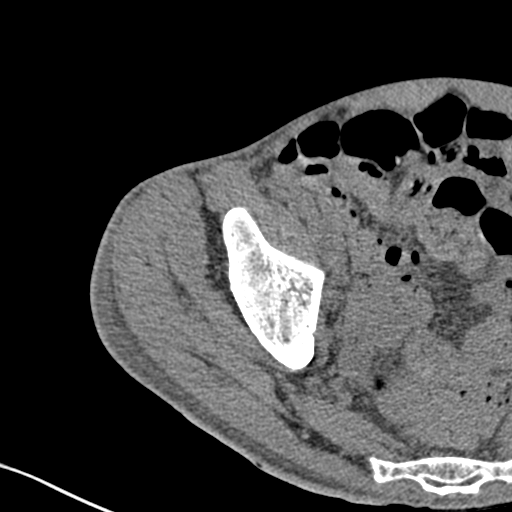
[im 109/117  lung]
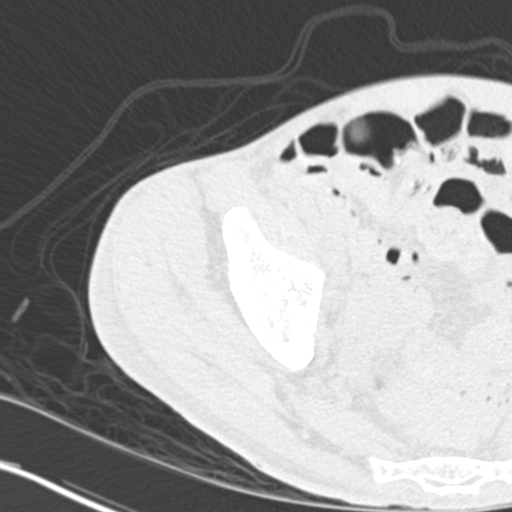

[Series 604: sagittal st · coronal · 0.46mm/px · 1 of 75 slices shown (1 of 2)]
[im 38/75  soft-tissue]
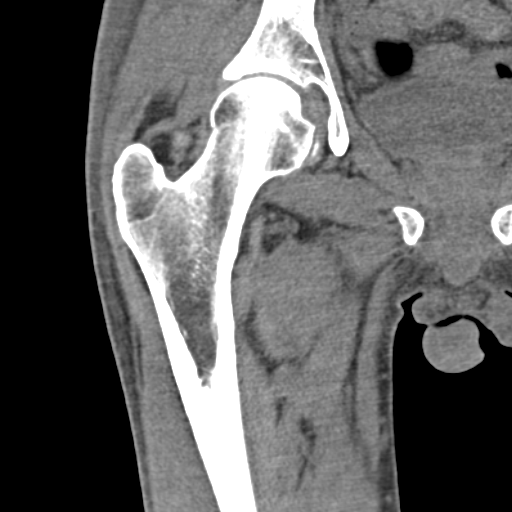

[Series 608: sagittal st · sagittal · 0.46mm/px · 1 of 72 slices shown (2 of 2)]
[im 24/72  soft-tissue]
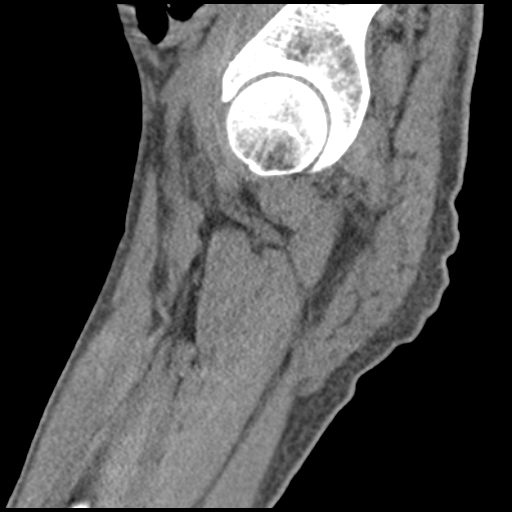

[14 of 46 positions shown; findings below may reference images not displayed]

FINDINGS: Bones/Joint/Cartilage

Examination is targeted to the right hip and inferior right
hemipelvis. There is no evidence of acute fracture, dislocation or
femoral head avascular necrosis. The bones appear adequately
mineralized. There is no significant right hip arthropathy or joint
effusion.

Ligaments

Suboptimally assessed by CT.

Muscles and Tendons

No focal muscular abnormalities are identified.

Soft tissues

There is mild generalized soft tissue edema without focal fluid
collection, foreign body or soft tissue emphysema. The visualized
internal pelvic contents appear unremarkable.
IMPRESSION: 1. No evidence of acute right hip fracture or dislocation.
2. Mild generalized soft tissue edema.

## 2019-03-31 NOTE — Progress Notes (Signed)
PT Cancellation Note  Patient Details Name: Franklin Anderson MRN: [id number] DOB: [birth year]   Cancelled Treatment:     awaiting R Hip CT as pt was unable to tolerate PT yesterday due to pain.   Rica Koyanagi  PTA Acute  Rehabilitation Services Pager      [phone] Office      [phone]

## 2019-03-31 NOTE — TOC Progression Note (Signed)
Transition of Care [facility]) - Progression Note    Patient Details  Name: Franklin Anderson MRN: [id number] Date of Birth: 04-15-1971  Transition of Care [facility]) CM/SW [place], [place] Phone Number: 03/31/2019, 9:17 AM  Clinical Narrative:   Have been texting Bryson Ha daily to remind her of need for bed.  She replied this AM stating that [facility] DON has said no to admission here.  She is waiting to hear back from leadership about possibility of housing at the [facility]. TOC will continue to follow during the course of hospitalization.     Expected Discharge Plan: Home/Self Care Barriers to Discharge: No Barriers Identified  Expected Discharge Plan and Services Expected Discharge Plan: Home/Self Care In-house Referral: Clinical Social Work     Living arrangements for the past 2 months: Single Family Home                                       Social Determinants of Health (SDOH) Interventions    Readmission Risk Interventions No flowsheet data found.

## 2019-03-31 NOTE — Progress Notes (Addendum)
PROGRESS NOTE    DEMPSEY AHONEN   WRU:[id number]  DOB: 08/28/1970  DOA: 02/21/2019 PCP: Mack Hook, MD   Brief Narrative:  MIKAI MEINTS 48 y.o.malewith medical history significant of rheumatic fever, HTN, ongoingIV heroin abuse, nicotine abuse, presented to the ED on 10/27 with 3 week course of progressively worsening back pain and BLE weakness associated with some bowel incontinence and trouble walking. Last use of heroin was on the day of admission.  In ED > Found to have:sepsis with Tm 100, HR 126, RR 31, WBC 22.4k,AKI with BUN 111 and creat 1.7. Imaging studies revealed osteomyelitis and diskitis of L4-L5,septic pulmonary emboli and psoas abscesses. He was started on IV antibiotics and admitted to [facility].  On 10/28 was found to be obtunded and dyspneic and was intubated. Blood cultures from admission grew out MSSA. Subsequently found to have RV vegetation,  left sided empyema (MSSA) and  a left pneumothorax for which he received a chest tube.  He also received a drain in his psoas abscess.  Both chest tube and drain have been removed and ID has signed off with instructions to continue IV antibiotics until 12/16. In the meantime he has not been able to participate with physical therapy due to ongoing complaints of pain.  He is on a number of different narcotics which apparently are not strong enough to control his current pain which appears to move from day-to-day.  Since the day that I became his attending (11/30) he has asked for increasing his pain medications on a daily basis  Subjective: He states he is still having right hip pain and wants me to order a CT scan.  No other complaints.  Assessment & Plan:   Principal Problem:   Sepsis - MSSA bacteremia with L4/L5 osteomyelitis and discitis, epidural abscess, psoas abscess, septic pulmonary emboli, empyema and right ventricular mural vegetation - Right psoas abscess was aspirated on 10/28 and culture revealedMSSA. -  Respiratory culture from 10/28 also grew MSSA. - 10/28 > 2D echo showed possible mitral valve vegetation and moderate pericardial effusion - 11/3> TEE-  There is a medium sized, mobile mass attached to the trabeculations in the RV and moderate sized pericardial effusion - per CT surgery, he was not a good candidate for thoracotomy - increasing pain on 11/17- MRI revealed increasing psoas abscesses- s/p drainage cath of right paraspinal abscess by IR 11/18- catheter was removed on 11/27  -ID plan>  cont Ancef through 12/16 followed by Keflex x 30 days - he has ongoing complaints of pain - interestingly his site of pain is not consistent and changes on a daily basis from left to right to hip to legs and then back to his back -  Although he appears to be drug seeking, I don't want to completely disregard his symptoms, thus a repeat MRI was obtained on 11/30 - this actually showed improvement in abscesses and size of phlegmon with ongoing (and expected) discitis and osteomyelitis - he continue to state that his right hip hurts today - on exam, his pain today is in the lateral part of his right hip- I have already increased his Flexeril, have already added Voltaren gel- he continues to use his Lidocaine patch  -I will obtain a CT of the right hip today - I have explained that we will not be increasing his narcotics but will need to try to wean him down prior to discharge- he declines this and continues to ask for an increase in narcotics on  a daily basis  Active Problems:   Acute hypoxic respiratory failure - due to MSSA infection with septic pulmonary emboli, left empyema and left bronchopleural fistula (hydropneumothorax) - intubated on 10/28- 10/31  - 10/28 Bronchoscopy with BAL  - s/p left chest tube- removed on 11/17 - now on room air-  -12/2 repeat CXR > Bilateral pulmonary nodules which are improved compared with the prior examination of 03/16/2019. Small residual left apical pneumothorax.   Moderate Pericardial effusion - treated conservatively and improving- effusion was mild per last  ECHO on 11/9  HTN - cont Norvasc and Metoprolol     Acute metabolic encephalopathy - due to infection - has resolved   Severe protein calorie malnutrition - cont dietary supplements  HCV + - per ID, seems to have cleared   Time spent in minutes: 35 DVT prophylaxis: Lovenox Code Status: Full code Family Communication:  Disposition Plan: home vs SNF when IV antibiotics completed Consultants:   PCCM, cardiology, ID, palliative care, cardiothoracic surgery Antimicrobials:  Anti-infectives (From admission, onward)   Start     Dose/Rate Route Frequency Ordered Stop   02/24/19 2200  vancomycin (VANCOCIN) IVPB 1000 mg/200 mL premix  Status:  Discontinued     1,000 mg 200 mL/hr over 60 Minutes Intravenous Every 12 hours 02/24/19 1010 02/24/19 1253   02/24/19 1600  ceFAZolin (ANCEF) IVPB 2g/100 mL premix     2 g 200 mL/hr over 30 Minutes Intravenous Every 8 hours 02/24/19 1253 04/12/19 2359   02/23/19 2200  vancomycin (VANCOCIN) IVPB 750 mg/150 ml premix  Status:  Discontinued     750 mg 150 mL/hr over 60 Minutes Intravenous Every 24 hours 02/23/19 0853 02/23/19 0909   02/23/19 2200  vancomycin (VANCOCIN) IVPB 750 mg/150 ml premix  Status:  Discontinued     750 mg 150 mL/hr over 60 Minutes Intravenous Every 12 hours 02/23/19 0909 02/24/19 1010   02/22/19 2200  vancomycin (VANCOCIN) IVPB 750 mg/150 ml premix  Status:  Discontinued     750 mg 150 mL/hr over 60 Minutes Intravenous Every 24 hours 02/22/19 0826 02/22/19 0827   02/22/19 2200  vancomycin (VANCOCIN) IVPB 1000 mg/200 mL premix  Status:  Discontinued     1,000 mg 200 mL/hr over 60 Minutes Intravenous Every 24 hours 02/22/19 0827 02/23/19 0853   02/22/19 0900  ceFEPIme (MAXIPIME) 2 g in sodium chloride 0.9 % 100 mL IVPB  Status:  Discontinued     2 g 200 mL/hr over 30 Minutes Intravenous Every 12 hours 02/22/19 0826 02/23/19  1135   02/21/19 2015  ceFEPIme (MAXIPIME) 2 g in sodium chloride 0.9 % 100 mL IVPB     2 g 200 mL/hr over 30 Minutes Intravenous  Once 02/21/19 2013 02/22/19 0031   02/21/19 2015  metroNIDAZOLE (FLAGYL) IVPB 500 mg     500 mg 100 mL/hr over 60 Minutes Intravenous  Once 02/21/19 2013 02/22/19 0031   02/21/19 2015  vancomycin (VANCOCIN) IVPB 1000 mg/200 mL premix     1,000 mg 200 mL/hr over 60 Minutes Intravenous  Once 02/21/19 2013 02/22/19 0200       Objective: Vitals:   03/30/19 0546 03/30/19 1342 03/30/19 2121 03/31/19 0548  BP: (!) 143/93 (!) 138/94 (!) 139/95 (!) 132/92  Pulse: 81 93 92 85  Resp: _0 Temp: 97.6 F (36.4 C) 97.6 F (36.4 C) 98.1 F (36.7 C) 97.7 F (36.5 C)  TempSrc: Oral Oral Oral Oral  SpO2: 97% 98% 99% 98%  Weight:      Height:        Intake/Output Summary (Last 24 hours) at 03/31/2019 0757 Last data filed at 03/31/2019 0350 Gross per 24 hour  Intake 720 ml  Output 2700 ml  Net -1980 ml   Filed Weights   03/14/19 0500 03/15/19 0522 03/15/19 2000  Weight: 49.8 kg 49.9 kg 49.9 kg    Examination: General exam: Appears comfortable  HEENT: PERRLA, oral mucosa moist, no sclera icterus or thrush Respiratory system: Clear to auscultation. Respiratory effort normal. Cardiovascular system: S1 & S2 heard,  No murmurs  Gastrointestinal system: Abdomen soft, non-tender, nondistended. Normal bowel sounds   Central nervous system: Alert and oriented. No focal neurological deficits. Extremities: No cyanosis, clubbing or edema-he is tender in the right lateral hip today and right lower back Skin: No rashes or ulcers Psychiatry:  Mood & affect appropriate.   Data Reviewed: I have personally reviewed following labs and imaging studies  CBC: Recent Labs  Lab 03/26/19 0915  WBC 7.9  HGB 10.4*  HCT 34.4*  MCV 97.2  PLT 259*   Basic Metabolic Panel: Recent Labs  Lab 03/26/19 0915  NA 138  K 3.8  CL 102  CO2 28  GLUCOSE 124*  BUN 20   CREATININE 0.45*  CALCIUM 9.0   GFR: Estimated Creatinine Clearance: 78.8 mL/min (A) (by C-G formula based on SCr of 0.45 mg/dL (L)). Liver Function Tests: Recent Labs  Lab 03/26/19 0915  AST 18  ALT 15  ALKPHOS 104  BILITOT 0.3  PROT 7.5  ALBUMIN 2.5*   No results for input(s): LIPASE, AMYLASE in the last 168 hours. No results for input(s): AMMONIA in the last 168 hours. Coagulation Profile: No results for input(s): INR, PROTIME in the last 168 hours. Cardiac Enzymes: No results for input(s): CKTOTAL, CKMB, CKMBINDEX, TROPONINI in the last 168 hours. BNP (last 3 results) No results for input(s): PROBNP in the last 8760 hours. HbA1C: No results for input(s): HGBA1C in the last 72 hours. CBG: Recent Labs  Lab 03/30/19 0744 03/30/19 1146 03/30/19 1641 03/30/19 2218 03/31/19 0748  GLUCAP 85 88 136* 89 96   Lipid Profile: No results for input(s): CHOL, HDL, LDLCALC, TRIG, CHOLHDL, LDLDIRECT in the last 72 hours. Thyroid Function Tests: No results for input(s): TSH, T4TOTAL, FREET4, T3FREE, THYROIDAB in the last 72 hours. Anemia Panel: No results for input(s): VITAMINB12, FOLATE, FERRITIN, TIBC, IRON, RETICCTPCT in the last 72 hours. Urine analysis:    Component Value Date/Time   BILIRUBINUR neg 12/21/2017 1551   PROTEINUR Negative 12/21/2017 1551   UROBILINOGEN 0.2 12/21/2017 1551   NITRITE neg 12/21/2017 1551   LEUKOCYTESUR Negative 12/21/2017 1551   Sepsis Labs: _0 (procalcitonin:4,lacticidven:4) )No results found for this or any previous visit (from the past 240 hour(s)).       Radiology Studies: Dg Chest Port 1 View  Result Date: 03/29/2019 CLINICAL DATA:  Fever, heroin abuse EXAM: PORTABLE CHEST 1 VIEW COMPARISON:  03/16/2019 FINDINGS: Multiple bilateral pulmonary nodules which have improved compared with the prior examination of 03/16/2019. No new focal consolidation. No pleural effusion. Small residual left apical pneumothorax. Stable  cardiomediastinal silhouette. No aggressive osseous lesion. IMPRESSION: 1. Bilateral pulmonary nodules which are improved compared with the prior examination of 03/16/2019. Small residual left apical pneumothorax. Electronically Signed   By: Kathreen Devoid   On: 03/29/2019 15:32      Scheduled Meds: . amLODipine  5 mg Oral Daily  . Chlorhexidine Gluconate Cloth  6 each Topical Daily  .  cyclobenzaprine  7.5 mg Oral TID  . diclofenac Sodium  2 g Topical QID  . enoxaparin (LOVENOX) injection  40 mg Subcutaneous Q24H  . feeding supplement (ENSURE ENLIVE)  237 mL Oral BID BM  . fentaNYL  1 patch Transdermal Q72H  . ketorolac  30 mg Intravenous Q6H  . lidocaine  1 patch Transdermal Q24H  . mouth rinse  15 mL Mouth Rinse BID  . metoprolol tartrate  100 mg Oral BID  . multivitamin with minerals  1 tablet Oral Daily  . pantoprazole  40 mg Oral Daily  . polyethylene glycol  17 g Oral Daily   Continuous Infusions: . sodium chloride 250 mL (03/07/19 2139)  .  ceFAZolin (ANCEF) IV 2 g (03/31/19 4801)     LOS: 82 days      Debbe Odea, MD [facility] Pager: [email or web address] Password [email or web address] 03/31/2019, 7:57 AM

## 2019-04-01 DIAGNOSIS — I2601 Septic pulmonary embolism with acute cor pulmonale: Secondary | ICD-10-CM

## 2019-04-01 LAB — GLUCOSE, CAPILLARY: Glucose-Capillary: 91 mg/dL (ref 70–99)

## 2019-04-01 NOTE — Progress Notes (Signed)
[facility]                                                                              Patient Demographics  Franklin Anderson, is a 48 y.o. male, DOB - [birth year], JME:[id number]  Admit date - 02/21/2019   Admitting Physician Etta Quill, DO  Outpatient Primary MD for the patient is Mack Hook, MD  Outpatient specialists:   LOS - 38  days   Medical records reviewed and are as summarized below:    Chief Complaint  Patient presents with   Back Pain   Heroin Detox   Weight Loss       Brief summary   Franklin Anderson 48 y.o.malewith medical history significant of rheumatic fever, HTN, ongoingIV heroin abuse, nicotine abuse, presented to the ED on 10/27 with 3 week course of progressively worsening back pain and BLE weakness associated with some bowel incontinence and trouble walking. Last use of heroin was on the day of admission. In ED > Found to have sepsis with Tm 100, HR 126, RR 31, WBC 22.4k,AKI with BUN 111 and creat 1.7. Imaging studies revealed osteomyelitis and diskitis of L4-L5,septic pulmonary emboli and psoas abscesses. He was started on IV antibiotics and admitted to [facility].  On 10/28 was found to be obtunded and dyspneic and was intubated. Blood cultures from admission grew out MSSA. Subsequently found to have RV vegetation,  left sided empyema (MSSA) and  a left pneumothorax for which he received a chest tube.  He also received a drain in his psoas abscess.  Both chest tube and drain have been removed and ID has signed off with instructions to continue IV antibiotics until 12/16. In the meantime he has not been able to participate with physical therapy due to ongoing complaints of pain.  He is on a number of different narcotics which apparently are not strong enough to control his current pain which appears to move from day-to-day.    Assessment & Plan   Principal problem Sepsis with MSSA bacteremia, L4-L5 osteomyelitis and discitis,  epidural abscess, psoas abscess, septic pulmonary emboli, empyema and right ventricular mural vegetation -Right psoas abscess was aspirated on 10/28, culture positive for MSSA. -Respiratory culture 10/28 also MSSA -2D echo 10/28 showed possible mitral valve vegetation and moderate pericardial effusion -Underwent TEE on 11/3 showed medium sized, mobile mass attached to the trabeculation in the RV and moderate sized pericardial effusion -Per CT surgery, not a good candidate for thoracotomy -MRI on 11/17 showed increasing psoas abscess s/p catheter drainage of right paraspinal abscess by IR on 11/18 -Catheter removed on 11/27 -ID recommended continue IV Ancef through 12/16 followed by Keflex for 30 days -Patient continued to complain of pain in his back, hip, MRI repeat was obtained on 11/30 showed improvement in the abscess in size of phlegmon with ongoing discitis and osteomyelitis -CT of the right hip showed no evidence of acute right hip fracture or dislocation, mild generalized soft tissue edema, no acute abnormality -Today states that Flexeril helped in the morning although he wants increased pain medication prior to PT.  Recommended nursing to give oxycodone prior to physical therapy.  Otherwise no further increase in narcotic regimen   Active problems Acute respiratory failure with hypoxemia secondary to MSSA infection, septic pulmonary emboli, left empyema, left bronchopleural fistula (hydropneumothorax) -Patient was intubated on 10/28-10/31 -Underwent bronchoscopy with BAL, s/p left chest tube, removed on 11/17 -Currently on room air, no acute issues -Chest x-ray 12/2 showed bilateral pulmonary nodules, which are improved compared to prior exam on 11/19.  Small residual left apical pneumothorax   Moderate pericardial effusion -Seen on 2D echo and TEE, treated conservatively and improving -Last echo on 11/9 showed mild effusion  Essential hypertension -Continue Norvasc,  metoprolol  Acute metabolic encephalopathy Due to #1 #2, currently resolved, alert and oriented x3  Severe protein calorie malnutrition, BMI 14.9 -Continue dietary supplements  HCV positive Per ID, seems to have cleared  HCV + - per ID, seems to have cleared  Pressure injury Sacrum with stage I, Right hip deep tissue injury Wound care per nursing   Code Status: Full code DVT Prophylaxis:  Lovenox  Family Communication: Discussed all imaging results, lab results, explained to the patient    Disposition Plan: Remains inpatient till completes IV antibiotics  Time Spent in minutes 25 minutes  Procedures:  2D echo TEE Intubation  Consultants:   PCCM, cardiology, ID, palliative care, cardiothoracic surgery  Antimicrobials:   Anti-infectives (From admission, onward)   Start     Dose/Rate Route Frequency Ordered Stop   02/24/19 2200  vancomycin (VANCOCIN) IVPB 1000 mg/200 mL premix  Status:  Discontinued     1,000 mg 200 mL/hr over 60 Minutes Intravenous Every 12 hours 02/24/19 1010 02/24/19 1253   02/24/19 1600  ceFAZolin (ANCEF) IVPB 2g/100 mL premix     2 g 200 mL/hr over 30 Minutes Intravenous Every 8 hours 02/24/19 1253 04/12/19 2359   02/23/19 2200  vancomycin (VANCOCIN) IVPB 750 mg/150 ml premix  Status:  Discontinued     750 mg 150 mL/hr over 60 Minutes Intravenous Every 24 hours 02/23/19 0853 02/23/19 0909   02/23/19 2200  vancomycin (VANCOCIN) IVPB 750 mg/150 ml premix  Status:  Discontinued     750 mg 150 mL/hr over 60 Minutes Intravenous Every 12 hours 02/23/19 0909 02/24/19 1010   02/22/19 2200  vancomycin (VANCOCIN) IVPB 750 mg/150 ml premix  Status:  Discontinued     750 mg 150 mL/hr over 60 Minutes Intravenous Every 24 hours 02/22/19 0826 02/22/19 0827   02/22/19 2200  vancomycin (VANCOCIN) IVPB 1000 mg/200 mL premix  Status:  Discontinued     1,000 mg 200 mL/hr over 60 Minutes Intravenous Every 24 hours 02/22/19 0827 02/23/19 0853   02/22/19 0900   ceFEPIme (MAXIPIME) 2 g in sodium chloride 0.9 % 100 mL IVPB  Status:  Discontinued     2 g 200 mL/hr over 30 Minutes Intravenous Every 12 hours 02/22/19 0826 02/23/19 1135   02/21/19 2015  ceFEPIme (MAXIPIME) 2 g in sodium chloride 0.9 % 100 mL IVPB     2 g 200 mL/hr over 30 Minutes Intravenous  Once 02/21/19 2013 02/22/19 0031   02/21/19 2015  metroNIDAZOLE (FLAGYL) IVPB 500 mg     500 mg 100 mL/hr over 60 Minutes Intravenous  Once 02/21/19 2013 02/22/19 0031   02/21/19 2015  vancomycin (VANCOCIN) IVPB 1000 mg/200 mL premix     1,000 mg 200 mL/hr over 60 Minutes Intravenous  Once 02/21/19 2013 02/22/19 0200          Medications  Scheduled Meds:  amLODipine  5 mg Oral Daily  Chlorhexidine Gluconate Cloth  6 each Topical Daily   cyclobenzaprine  7.5 mg Oral TID   diclofenac Sodium  2 g Topical QID   enoxaparin (LOVENOX) injection  40 mg Subcutaneous Q24H   feeding supplement (ENSURE ENLIVE)  237 mL Oral BID BM   fentaNYL  1 patch Transdermal Q72H   lidocaine  1 patch Transdermal Q24H   mouth rinse  15 mL Mouth Rinse BID   metoprolol tartrate  100 mg Oral BID   multivitamin with minerals  1 tablet Oral Daily   pantoprazole  40 mg Oral Daily   polyethylene glycol  17 g Oral Daily   Continuous Infusions:  sodium chloride 10 mL/hr at 03/31/19 1800    ceFAZolin (ANCEF) IV 2 g (04/01/19 0556)   PRN Meds:.sodium chloride, acetaminophen **OR** acetaminophen, acetaminophen, clonazePAM, hydrALAZINE, HYDROmorphone, ipratropium-albuterol, ondansetron **OR** ondansetron (ZOFRAN) IV, oxyCODONE, senna-docusate      Subjective:   Franklin Anderson was seen and examined today.  States Flexeril helped this morning.  Wants pain medications prior to PT so he can work with physical therapy.  Patient denies dizziness, chest pain, shortness of breath, abdominal pain, N/V.  No acute events overnight.    Objective:   Vitals:   03/31/19 0548 03/31/19 1421 03/31/19 2103 04/01/19  0446  BP: (!) 132/92 136/84 (!) 148/101 (!) 129/93  Pulse: 85 87 98 86  Resp: _0 Temp: 97.7 F (36.5 C) 98.2 F (36.8 C) 98.2 F (36.8 C) 97.8 F (36.6 C)  TempSrc: Oral Oral Oral Oral  SpO2: 98% 98% 98% 98%  Weight:      Height:        Intake/Output Summary (Last 24 hours) at 04/01/2019 1230 Last data filed at 04/01/2019 2423 Gross per 24 hour  Intake 1245 ml  Output 975 ml  Net 270 ml     Wt Readings from Last 3 Encounters:  03/15/19 49.9 kg  12/21/17 65.3 kg  04/22/16 68 kg     Exam  General: Alert and oriented x 3, NAD  Eyes:  HEENT:  Atraumatic, normocephalic, normal oropharynx  Cardiovascular: S1 S2 auscultated, no murmurs, RRR  Respiratory: Clear to auscultation bilaterally, no wheezing, rales or rhonchi  Gastrointestinal: Soft, nontender, nondistended, + bowel sounds  Ext: no pedal edema bilaterally  Neuro: No new deficits  Musculoskeletal: No digital cyanosis, clubbing  Skin: No rashes  Psych: Normal affect and demeanor, alert and oriented x3    Data Reviewed:  I have personally reviewed following labs and imaging studies  Micro Results No results found for this or any previous visit (from the past 240 hour(s)).  Radiology Reports Dg Chest 1 View  Result Date: 03/16/2019 CLINICAL DATA:  Pleural effusion. EXAM: CHEST  1 VIEW COMPARISON:  March 15, 2019. FINDINGS: The heart size and mediastinal contours are within normal limits. Stable mild left apical and basilar pneumothorax is noted. Stable bilateral nodular densities are noted, some of which are cavitary, consistent with septic emboli. The visualized skeletal structures are unremarkable. IMPRESSION: Stable mild left pneumothorax. Stable bilateral nodular densities are again noted consistent with septic emboli. Electronically Signed   By: Marijo Conception M.D.   On: 03/16/2019 13:49   Dg Chest 1 View  Result Date: 03/03/2019 CLINICAL DATA:  48 year old male with increased  shortness of breath EXAM: CHEST  1 VIEW COMPARISON:  Multiple prior comparison is most recently 03/03/2019, dating to 02/28/2019 FINDINGS: Cardiomediastinal silhouette unchanged in size and contour. Nodules throughout the lungs again  noted, compatible with known septic emboli. Unchanged position of thoracostomy tube at the left lung base. The small pneumothorax at the lateral left lung appears larger than the comparison plain film. Similar appearance of coarsened interstitial markings throughout. IMPRESSION: Unchanged thoracostomy tube. The small left pneumothorax appears slightly larger than the comparison plain film though overall has been improving over the comparison chest x-rays dating to 02/28/2019. Multiple pulmonary nodules compatible with known septic emboli. Electronically Signed   By: Corrie Mckusick D.O.   On: 03/03/2019 14:42   Dg Chest 2 View  Result Date: 03/10/2019 CLINICAL DATA:  Chest tube, left pleural effusion EXAM: CHEST - 2 VIEW COMPARISON:  03/08/2019 FINDINGS: Left-sided pigtail chest tube remains in position about the left lung base, with a small, persistent left apical pneumothorax, not significantly changed compared to prior examination. Small, persistent pleural effusion component about the left lung base. Multiple pulmonary nodules are again noted bilaterally, some of which are cavitary. The heart and mediastinum are unremarkable. IMPRESSION: 1. Left-sided pigtail chest tube remains in position about the left lung base, with a small, persistent left apical pneumothorax, not significantly changed compared to prior examination. Small, persistent pleural effusion component about the left lung base. 2. Multiple pulmonary nodules are again noted bilaterally, some of which are cavitary. Electronically Signed   By: Eddie Candle M.D.   On: 03/10/2019 13:48   Ct Chest W Contrast  Result Date: 03/08/2019 CLINICAL DATA:  Septic pulmonary emboli; history rheumatic fever, hypertension, drug  abuse EXAM: CT CHEST WITH CONTRAST TECHNIQUE: Multidetector CT imaging of the chest was performed during intravenous contrast administration. Sagittal and coronal MPR images reconstructed from axial data set. CONTRAST:  29m OMNIPAQUE IOHEXOL 300 MG/ML  SOLN IV COMPARISON:  02/28/2019 FINDINGS: Cardiovascular: Vascular structures patent. No pericardial effusion. Heart normal size. Pulmonary arteries grossly unremarkable for phase of imaging. Mediastinum/Nodes: Base of cervical region normal appearance. Patient cachectic. No definite adenopathy. Lungs/Pleura: Multiple masses are identified throughout both lungs, many cavitary, consistent with septic emboli. Largest RIGHT lung lesion 2.7 x 2.7 cm RIGHT middle lobe image 123. Largest LEFT lung lesion in lingula 4.7 x 4.5 cm image 101. Dependent atelectasis in the posterior lungs bilaterally. Interval decrease in size of significant LEFT pneumothorax component. Partially loculated LEFT pleural effusion with air-fluid level. Upper Abdomen: Small cyst upper LEFT kidney. Remaining visualized upper abdomen unremarkable. Musculoskeletal: No acute osseous lesions. IMPRESSION: Persistent loculated LEFT hydropneumothorax, with decrease in pneumothorax component since previous exam. Septic emboli throughout both lungs, little changed. Electronically Signed   By: MLavonia DanaM.D.   On: 03/08/2019 10:37   Ct Lumbar Spine W Contrast  Result Date: 03/20/2019 CLINICAL DATA:  Abdominal pain and fever. Abscess. At the drug abuse. Progressively worse back pain and bilateral lower extremity weakness. EXAM: CT LUMBAR SPINE WITH CONTRAST TECHNIQUE: Multidetector CT imaging of the lumbar spine was performed with intravenous contrast administration. CONTRAST:  1030mOMNIPAQUE IOHEXOL 300 MG/ML  SOLN COMPARISON:  MRI of the lumbar spine 03/14/2019. CT guided drain placement 03/15/2019 FINDINGS: Segmentation: 5 non rib-bearing lumbar type vertebral bodies are present. The lowest fully  formed vertebral body is L5. Alignment: No significant listhesis is present. There is straightening of the normal lumbar lordosis. Rightward curvature is present at the thoracolumbar junction. Compensatory leftward curvature is present at L5-S1. Vertebrae: Lytic changes are noted at the endplates of L4G2-9ith some collapse on right. There is no progression collapse. Vertebral body heights are otherwise maintained. No other focal lytic or blastic lesions  are present. Paraspinal and other soft tissues: Right paraspinal drain is in place. The largest collection collapsed. A more posterior and slightly superior peripherally enhancing collection measures 12 mm. No significant adenopathy is present. Atherosclerotic changes are noted in the aorta without aneurysm. Disc levels: L1-2: Negative. L2-3: No significant erosive changes are present. Leftward disc protrusion is noted. L3-4: A leftward disc protrusion present. Mild facet hypertrophy is noted bilaterally. L4-5: A broad-based disc protrusion is associated with the infected disc. Moderate foraminal stenosis is again seen bilaterally. L5-S1: Mild disc bulging is present. Facet hypertrophy is worse on the right. IMPRESSION: 1. The right paraspinous scratched at the larger of the 2 right paraspinous fluid collections is drained. 2. The smaller, more superior posterior collection remains, measuring up to 12 mm. 3. Stable lytic endplate changes associated with disc osteomyelitis at L4-5. Collapse is worse on the right, stable from prior exam. 4. Subtle endplate changes posteriorly at L2-3 and at the right facet of L5-S1 without progression. 5.  Aortic Atherosclerosis (ICD10-I70.0). Electronically Signed   By: San Morelle M.D.   On: 03/20/2019 15:41   Mr Lumbar Spine W Wo Contrast  Result Date: 03/27/2019 CLINICAL DATA:  Back pain, infection. EXAM: MRI LUMBAR SPINE WITHOUT AND WITH CONTRAST TECHNIQUE: Multiplanar and multiecho pulse sequences of the lumbar  spine were obtained without and with intravenous contrast. CONTRAST:  9m GADAVIST GADOBUTROL 1 MMOL/ML IV SOLN COMPARISON:  CT scan 03/20/2019 MRI from 03/14/2019 FINDINGS: Segmentation: The lowest lumbar type non-rib-bearing vertebra is labeled as L5. Alignment:  No vertebral subluxation is observed. Vertebrae: Again observed discitis osteomyelitis at the L4-5 level with endplate edema and sclerosis, and irregular rim enhancing collections along the anterior intervertebral disc space. There is bilateral facet edema and enhancement at this level, and septic arthritis of the facet joints is not excluded given the appearance. Endplate enhancement and type 1 degenerative endplate findings at LD6-3 with subtle accentuated T2 signal in the L2-3 intervertebral disc, a second site of early discitis is likely. Stable 6 mm focus of T2 hyperintensity in the T12 vertebral body, probably a small hemangioma or similar benign lesion. Left eccentric type 1 degenerative endplate findings at TO75-64 Conus medullaris and cauda equina: Conus extends to the L1-2. Paraspinal and other soft tissues: Small abscesses are present along the right psoas muscle with surrounding myositis. This includes a 0.9 by 1.0 by 2.0 cm abscess on image 34/6 anteriorly, and a 0.9 cm posterior right psoas abscess on image 30/6. Paraspinal phlegmon is most prominent at the L4-5 level and surrounds the vertebral level, and also at the L2-3 level and adjacent levels up to T12. The previous enhancing epidural phlegmon at the L4-5 level is less striking than on the prior exam. There is abnormal enhancement extending in the L4-5 and to a lesser extent L5-S1 neural foramina bilaterally. I do not see a well-defined epidural abscess. Disc levels: T12-L1: Unremarkable. L1-2: No impingement. L2-3: Borderline left subarticular lateral recess stenosis due to left eccentric intervertebral spurring and facet spurring. L3-4: Mild left subarticular lateral recess stenosis  and mild left foraminal stenosis due to left lateral recess and foraminal disc protrusion, disc bulge, and mild facet arthropathy. L4-5: At least mild bilateral foraminal stenosis due to facet and intervertebral spurring. L5-S1: Borderline bilateral foraminal stenosis due to disc bulge, intervertebral spurring, and facet arthropathy. IMPRESSION: 1. Active discitis-osteomyelitis at L4-5 and L2-3, with small right psoas muscle abscesses (reduced in size from prior exam) and considerable paraspinal phlegmon tracking between T12 L5.  The amount of anterior epidural phlegmon at the L4-5 level is improved, but there is continued abnormal enhancement in both neural foramina at L4-5 and L5-S1. In addition, the facet joints at L4-5 demonstrate severe arthropathy and septic facet joints are not excluded. 2. Lumbar spondylosis and degenerative disc disease with mild impingement at L3-4 and L4-5. Electronically Signed   By: Van Clines M.D.   On: 03/27/2019 18:48   Mr Lumbar Spine W Wo Contrast  Result Date: 03/14/2019 CLINICAL DATA:  Back pain, infection suspected. Patient with disseminated MSSA infection multiple abscesses. Worsening lower back pain not responding to pain management. Follow-up known osteomyelitis in spine. EXAM: MRI LUMBAR SPINE WITHOUT AND WITH CONTRAST TECHNIQUE: Multiplanar and multiecho pulse sequences of the lumbar spine were obtained without and with intravenous contrast. CONTRAST:  53m GADAVIST GADOBUTROL 1 MMOL/ML IV SOLN COMPARISON:  Lumbar spine MRI 02/21/2019 FINDINGS: Segmentation: For the purposes of this dictation, five lumbar vertebrae are assumed and the caudal most well-formed intervertebral disc is designated L5-S1. Alignment: Straightening of the expected lumbar lordosis. No significant spondylolisthesis. Lumbar levocurvature. Vertebrae: Vertebral body height is maintained. Endplate irregularity at L4-L5 has increased as compared to prior examination. Again demonstrated there is  extensive abnormal T2 hyperintense signal throughout the L4 and L5 vertebrae extending to the disc space with associated abnormal enhancement. Findings consistent with discitis/osteomyelitis. As before, there is fluid within the bilateral L4-L5 facet joints (greater on the right) and septic arthritis is difficult to exclude (series 11, image 29). More conspicuous than on prior examination, there is abnormal T2 hyperintensity and enhancement within the L2 and L3 vertebrae along the disc space. Findings highly suspicious for an additional level of discitis/osteomyelitis. Conus medullaris and cauda equina: Conus extends to the L2 level. No signal abnormality within the visualized distal spinal cord. New from prior MRI, there is ventral epidural phlegmon at the L4-L5 level measuring 0.6 cm in AP dimension (series 10, image 7). Paraspinal and other soft tissues: There is nonspecific soft tissue edema and enhancement within the lumbar paraspinal soft tissues, greatest at L4-L5 level. More conspicuous than on prior examination. There is abnormal paraspinal enhancing soft tissue consistent with phlegmon at the L4-L5 level extending to involve both psoas muscles. This extends into the bilateral L4-L5 neural foramina and partially into the bilateral L5-S1 neural foramina. Again demonstrated are bilateral psoas abscesses, the largest on the right measuring 2.7 x 2.4 x 4.4 cm (series 6, image 33) (series 9, image 13). Disc levels: T12-L1: No disc herniation. No significant canal or foraminal stenosis. L1-L2: No disc herniation. No significant canal or foraminal stenosis. L2-L3: Disc bulge with endplate spurring. New from prior examination there is a left center/subarticular caudally migrated disc extrusion. Mild facet arthrosis. The disc extrusion contributes to significant left subarticular stenosis with encroachment upon the descending left L3 nerve root. No significant central canal stenosis or neural foraminal narrowing.  L3-L4: Disc bulge asymmetric to the left with associated endplate spurring. Mild facet arthrosis. Mild left subarticular and bilateral neural foraminal narrowing. No significant central canal stenosis. L4-L5: Disc bulge with endplate remodeling. Ventral epidural phlegmon as described. No significant spinal canal stenosis. As before, abnormal enhancement likely reflecting phlegmon extends into both neural foramina with severe bilateral neural foraminal narrowing. L5-S1: Small disc bulge. Mild facet arthrosis. No significant spinal canal stenosis. Abnormal soft tissue enhancement likely reflecting phlegmon extends into the bilateral neural foramina. IMPRESSION: 1. Redemonstrated findings of L4-L5 discitis/osteomyelitis. Endplate irregularity at this level has progressed. Persistent fluid within the  bilateral L4-L5 facet joints (greater on the right). For which septic arthritis is difficult to exclude. New ventral epidural phlegmon at this level measuring 0.6 cm in AP dimension without significant canal stenosis. Persistent extensive paraspinal phlegmon at this level extending into the bilateral L4-L5 neural foramina with resultant severe neural foraminal narrowing. Phlegmon also extends partially into the bilateral L5-S1 neural foramina. 2. Signal abnormality and abnormal enhancement now present at the L2-L3 level also suspicious for discitis/osteomyelitis. 3. Redemonstrated bilateral psoas abscesses, the largest on the right measuring 2.7 x 2.4 x 4.4 cm. 4. New L2-L3 left center/subarticular caudally migrated disc extrusion. Resultant left subarticular stenosis with encroachment upon the descending left L3 nerve root. Electronically Signed   By: Kellie Simmering DO   On: 03/14/2019 13:07   Ct Hip Right Wo Contrast  Result Date: 03/31/2019 CLINICAL DATA:  Severe right hip pain for 5 days. No known injury. Suspected hip fracture. EXAM: CT OF THE RIGHT HIP WITHOUT CONTRAST TECHNIQUE: Multidetector CT imaging of the  right hip was performed according to the standard protocol. Multiplanar CT image reconstructions were also generated. COMPARISON:  Pelvic CT 06/14/2006. FINDINGS: Bones/Joint/Cartilage Examination is targeted to the right hip and inferior right hemipelvis. There is no evidence of acute fracture, dislocation or femoral head avascular necrosis. The bones appear adequately mineralized. There is no significant right hip arthropathy or joint effusion. Ligaments Suboptimally assessed by CT. Muscles and Tendons No focal muscular abnormalities are identified. Soft tissues There is mild generalized soft tissue edema without focal fluid collection, foreign body or soft tissue emphysema. The visualized internal pelvic contents appear unremarkable. IMPRESSION: 1. No evidence of acute right hip fracture or dislocation. 2. Mild generalized soft tissue edema. Electronically Signed   By: Richardean Sale M.D.   On: 03/31/2019 15:16   Dg Chest Port 1 View  Result Date: 03/29/2019 CLINICAL DATA:  Fever, heroin abuse EXAM: PORTABLE CHEST 1 VIEW COMPARISON:  03/16/2019 FINDINGS: Multiple bilateral pulmonary nodules which have improved compared with the prior examination of 03/16/2019. No new focal consolidation. No pleural effusion. Small residual left apical pneumothorax. Stable cardiomediastinal silhouette. No aggressive osseous lesion. IMPRESSION: 1. Bilateral pulmonary nodules which are improved compared with the prior examination of 03/16/2019. Small residual left apical pneumothorax. Electronically Signed   By: Kathreen Devoid   On: 03/29/2019 15:32   Dg Chest Port 1 View  Result Date: 03/15/2019 CLINICAL DATA:  Hydropneumothorax. EXAM: PORTABLE CHEST 1 VIEW COMPARISON:  March 14, 2019. FINDINGS: The heart size and mediastinal contours are within normal limits. Stable mild left apical pneumothorax is noted. Stable bilateral nodular densities are noted some of which are cavitary, consistent with septic emboli. The  visualized skeletal structures are unremarkable. IMPRESSION: Stable mild left apical pneumothorax is noted. Stable bilateral nodular densities are noted, some of which are cavitary, consistent with septic emboli. Electronically Signed   By: Marijo Conception M.D.   On: 03/15/2019 13:03   Dg Chest Port 1 View  Result Date: 03/14/2019 CLINICAL DATA:  Chest tube removal EXAM: PORTABLE CHEST 1 VIEW COMPARISON:  03/14/2019, 03/13/2019, 03/10/2019 FINDINGS: Interim removal of left-sided chest tube. Overall no significant interval change in size of small residual left apicolateral pneumothorax. There is probable trace pneumothorax at the left CP angle. Stable cardiomediastinal silhouette. Multiple bilateral nodules and cavitary lesions as before. IMPRESSION: 1. Removal of left chest tube. 2. Overall no significant interval change in size of small residual left apicolateral pneumothorax. Now seen is a small amount of pneumothorax component  at the left CP angle. 3. Stable bilateral lung nodules and cavitary lesions. Electronically Signed   By: Donavan Foil M.D.   On: 03/14/2019 16:55   Dg Chest Port 1 View  Result Date: 03/14/2019 CLINICAL DATA:  Hydropneumothorax EXAM: PORTABLE CHEST 1 VIEW COMPARISON:  03/13/2019 FINDINGS: No significant change in AP portable examination with a persistent small left lateral and apical pneumothorax component and a left-sided pigtail chest tube in position about the lateral lung base. Probable small persistent effusion component. No change in multiple masses and nodules bilaterally, some of which are cavitary. The heart and mediastinum are unremarkable. IMPRESSION: 1. No significant change in AP portable examination with a persistent small left lateral and apical pneumothorax component and a left-sided pigtail chest tube in position about the lateral lung base. Probable small persistent effusion component. 2. No change in multiple masses and nodules bilaterally, some of which are  cavitary. Electronically Signed   By: Eddie Candle M.D.   On: 03/14/2019 09:58   Dg Chest Port 1 View  Result Date: 03/13/2019 CLINICAL DATA:  48 year male with shortness of breath and left-sided pneumothorax. History of septic emboli. EXAM: PORTABLE CHEST 1 VIEW COMPARISON:  Chest radiograph dated 03/10/2019. FINDINGS: Left-sided chest tube in similar position. Overall slight interval increase in the size of the left pneumothorax since the radiograph of 03/10/2019. Continued follow-up recommended. No mediastinal shift. Bilateral nodular and confluent airspace densities similar to prior radiograph. There is a small left pleural effusion. Stable cardiac silhouette. No acute osseous pathology. IMPRESSION: 1. Slight interval increase in the size of the left pneumothorax since the radiograph of 03/10/19. Continued follow-up recommended. 2. Bilateral nodular and confluent airspace densities similar to prior radiograph. Electronically Signed   By: Anner Crete M.D.   On: 03/13/2019 12:26   Dg Chest Port 1 View  Result Date: 03/07/2019 CLINICAL DATA:  Left chest tube in place for pneumothorax. EXAM: PORTABLE CHEST 1 VIEW COMPARISON:  Single-view of the chest 03/04/2019 03/03/2019. CT chest 02/28/2019. History of septic emboli. FINDINGS: Pigtail catheter remains in place in the lower left chest. Left pneumothorax seen on the most recent examination has slightly increased in size. Multiple cavitary nodules are again seen bilaterally. No right pneumothorax. Heart size is normal. IMPRESSION: Small left pneumothorax seen on the most recent examination has mildly increased in size. Left chest tube remains in place. Multiple cavitary nodules bilaterally consistent with septic emboli. Electronically Signed   By: Inge Rise M.D.   On: 03/07/2019 10:33   Dg Chest Port 1 View  Result Date: 03/04/2019 CLINICAL DATA:  Pneumothorax EXAM: PORTABLE CHEST 1 VIEW COMPARISON:  Chest radiograph 03/03/2019.  FINDINGS: Monitoring leads overlie the patient. Stable cardiac and mediastinal contours. Scattered nodules demonstrated throughout the lungs bilaterally. Similar heterogeneous opacities left lung base. Left chest tube remains in position. Similar small pneumothorax within the upper lateral left hemithorax. Thoracic spine degenerative changes. IMPRESSION: 1. Similar small pneumothorax within the upper lateral left hemithorax with chest tube in place. 2. Scattered nodules throughout the lungs bilaterally. 3. Similar heterogeneous opacities left lung base. Electronically Signed   By: Lovey Newcomer M.D.   On: 03/04/2019 09:39   Dg Chest Port 1 View  Result Date: 03/03/2019 CLINICAL DATA:  Left-sided pneumothorax. EXAM: PORTABLE CHEST 1 VIEW COMPARISON:  03/02/2019.  CT FINDINGS: Left chest tube in stable position. Small left pneumothorax unchanged. Multiple bilateral cavitary pulmonary nodules consistent with septic emboli again noted. These have progressed in number and size from prior  exam. Heart size normal. No acute bony abnormality. IMPRESSION: 1. Left chest tube in stable position. Stable small left pneumothorax. 2. Multiple cavitary pulmonary nodules consistent with septic emboli are again noted. These have progressed in number and size from prior exam. 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 Patient presents today for CT-guided aspiration and/or drainage catheter placement. No, patient previously underwent CT-guided paraspinal aspiration on 02/22/2019. EXAM: CT IMAGE GUIDED DRAINAGE BY PERCUTANEOUS CATHETER COMPARISON:  Lumbar spine MRI-03/14/2019; CT-guided paraspinal abscess aspiration-02/22/2019 MEDICATIONS: The patient is currently admitted to the hospital and receiving intravenous antibiotics. The  antibiotics were administered within an appropriate time frame prior to the initiation of the procedure. ANESTHESIA/SEDATION: Moderate (conscious) sedation was employed during this procedure. A total of Benadryl 25 mg IV; versed 3 mg and Fentanyl 100 mcg was administered intravenously. Moderate Sedation Time: 14 minutes. The patient's level of consciousness and vital signs were monitored continuously by radiology nursing throughout the procedure under my direct supervision. CONTRAST:  None COMPLICATIONS: None immediate. PROCEDURE: Informed written consent was obtained from the patient after a discussion of the risks, benefits and alternatives to treatment. The patient was placed prone on the CT gantry and a pre procedural CT was performed re-demonstrating the known abscess/fluid collection within the inferior aspect the right iliopsoas musculature with dominant component measuring approximately 2.6 x 2.4 cm (25, series 2) the procedure was planned. A timeout was performed prior to the initiation of the procedure. The skin overlying the posterior inferior back was prepped and draped in the usual sterile fashion. The overlying soft tissues were anesthetized with 1% lidocaine with epinephrine. Appropriate trajectory was planned with the use of a 22 gauge spinal needle. An 18 gauge trocar needle was advanced into the abscess/fluid collection and a short Amplatz super stiff wire was coiled within the collection. Appropriate positioning was confirmed with a limited CT scan. The tract was serially dilated allowing placement of a 10 [place] all-purpose drainage catheter. Appropriate positioning was confirmed with a limited postprocedural CT scan. Approximately 15 ml of purulent fluid was aspirated. The tube was connected to a JP bulb and sutured in place. A dressing was placed. The patient tolerated the procedure well without immediate post procedural complication. IMPRESSION: Successful CT guided placement of a 10 French  all purpose drain catheter into the recurrent right paraspinal abscess with aspiration of 15 mL of purulent fluid. Samples were sent to the laboratory as requested by the ordering clinical team. PLAN: - Flush the percutaneous drainage catheter with 10 cc of normal saline b.i.d. - Record daily output from the percutaneous drainage catheter. - once drainage catheter output is less than 10 cc per day (excluding flush), repeat contrast-enhanced CT scan of the abdomen and pelvis is recommended to ensure abscess resolution. Ultimately, the percutaneous drainage catheter may be removed once the abscess has resolved on CT imaging, not requiring dedicated percutaneous drainage catheter injection given lack clinical concern for an enteric fistula. Electronically Signed   By: Sandi Mariscal M.D.   On: 03/15/2019 17:06   Vas [place] Upper Extremity Venous Duplex  Result Date: 03/07/2019 UPPER VENOUS STUDY  Indications: Pain Comparison Study: no prior Performing Technologist: Abram Sander RVS  Examination Guidelines: A complete evaluation includes B-mode imaging, spectral Doppler, color Doppler, and power Doppler as needed of all accessible portions of each vessel.  Bilateral testing is considered an integral part of a complete examination. Limited examinations for reoccurring indications may be performed as noted.  Right Findings: +----------+------------+---------+-----------+----------+-------+  RIGHT      Compressible Phasicity Spontaneous Properties Summary  +----------+------------+---------+-----------+----------+-------+  Subclavian                 Yes        Yes                         +----------+------------+---------+-----------+----------+-------+  Left Findings: +----------+------------+---------+-----------+----------+-------+  LEFT       Compressible Phasicity Spontaneous Properties Summary  +----------+------------+---------+-----------+----------+-------+  IJV            Full        Yes        Yes                          +----------+------------+---------+-----------+----------+-------+  Subclavian     Full        Yes        Yes                         +----------+------------+---------+-----------+----------+-------+  Axillary       Full        Yes        Yes                         +----------+------------+---------+-----------+----------+-------+  Brachial       Full        Yes        Yes                         +----------+------------+---------+-----------+----------+-------+  Radial         Full                                               +----------+------------+---------+-----------+----------+-------+  Ulnar          Full                                               +----------+------------+---------+-----------+----------+-------+  Cephalic       Full                                               +----------+------------+---------+-----------+----------+-------+  Basilic        Full                                               +----------+------------+---------+-----------+----------+-------+  Summary:  Right: No evidence of thrombosis in the subclavian.  Left: No evidence of deep vein thrombosis in the upper extremity. No evidence of superficial vein thrombosis in the upper extremity. No evidence of thrombosis in the subclavian.  *See table(s) above for measurements and observations.  Diagnosing physician: Harold Barban MD Electronically signed  by Harold Barban MD on 03/07/2019 at 5:52:52 PM.    Final     Lab Data:  CBC: Recent Labs  Lab 03/26/19 0915  WBC 7.9  HGB 10.4*  HCT 34.4*  MCV 97.2  PLT 833*   Basic Metabolic Panel: Recent Labs  Lab 03/26/19 0915 03/31/19 1020  NA 138 137  K 3.8 3.7  CL 102 100  CO2 28 25  GLUCOSE 124* 166*  BUN 20 18  CREATININE 0.45* 0.51*  CALCIUM 9.0 8.5*   GFR: Estimated Creatinine Clearance: 78.8 mL/min (A) (by C-G formula based on SCr of 0.51 mg/dL (L)). Liver Function Tests: Recent Labs  Lab 03/26/19 0915  AST 18  ALT 15  ALKPHOS 104   BILITOT 0.3  PROT 7.5  ALBUMIN 2.5*   No results for input(s): LIPASE, AMYLASE in the last 168 hours. No results for input(s): AMMONIA in the last 168 hours. Coagulation Profile: No results for input(s): INR, PROTIME in the last 168 hours. Cardiac Enzymes: No results for input(s): CKTOTAL, CKMB, CKMBINDEX, TROPONINI in the last 168 hours. BNP (last 3 results) No results for input(s): PROBNP in the last 8760 hours. HbA1C: No results for input(s): HGBA1C in the last 72 hours. CBG: Recent Labs  Lab 03/31/19 0748 03/31/19 1131 03/31/19 1622 03/31/19 2233 04/01/19 0737  GLUCAP 96 109* 100* 131* 91   Lipid Profile: No results for input(s): CHOL, HDL, LDLCALC, TRIG, CHOLHDL, LDLDIRECT in the last 72 hours. Thyroid Function Tests: No results for input(s): TSH, T4TOTAL, FREET4, T3FREE, THYROIDAB in the last 72 hours. Anemia Panel: No results for input(s): VITAMINB12, FOLATE, FERRITIN, TIBC, IRON, RETICCTPCT in the last 72 hours. Urine analysis:    Component Value Date/Time   BILIRUBINUR neg 12/21/2017 1551   PROTEINUR Negative 12/21/2017 1551   UROBILINOGEN 0.2 12/21/2017 1551   NITRITE neg 12/21/2017 1551   LEUKOCYTESUR Negative 12/21/2017 1551     Franklin Anderson M.D. [facility] 04/01/2019, 12:30 PM   Call night coverage person covering after 7pm

## 2019-04-02 NOTE — Progress Notes (Signed)
[facility]                                                                              Patient Demographics  Franklin Anderson, is a 48 y.o. male, DOB - 02-10-1971, BWI:[id number]  Admit date - 02/21/2019   Admitting Physician Etta Quill, DO  Outpatient Primary MD for the patient is Mack Hook, MD  Outpatient specialists:   LOS - 39  days   Medical records reviewed and are as summarized below:    Chief Complaint  Patient presents with   Back Pain   Heroin Detox   Weight Loss       Brief summary   Franklin Anderson 48 y.o.malewith medical history significant of rheumatic fever, HTN, ongoingIV heroin abuse, nicotine abuse, presented to the ED on 10/27 with 3 week course of progressively worsening back pain and BLE weakness associated with some bowel incontinence and trouble walking. Last use of heroin was on the day of admission. In ED > Found to have sepsis with Tm 100, HR 126, RR 31, WBC 22.4k,AKI with BUN 111 and creat 1.7. Imaging studies revealed osteomyelitis and diskitis of L4-L5,septic pulmonary emboli and psoas abscesses. He was started on IV antibiotics and admitted to [facility].  On 10/28 was found to be obtunded and dyspneic and was intubated. Blood cultures from admission grew out MSSA. Subsequently found to have RV vegetation,  left sided empyema (MSSA) and  a left pneumothorax for which he received a chest tube.  He also received a drain in his psoas abscess.  Both chest tube and drain have been removed and ID has signed off with instructions to continue IV antibiotics until 12/16. In the meantime he has not been able to participate with physical therapy due to ongoing complaints of pain.  He is on a number of different narcotics which apparently are not strong enough to control his current pain which appears to move from day-to-day.    Assessment & Plan   Principal problem Sepsis with MSSA bacteremia, L4-L5 osteomyelitis and discitis,  epidural abscess, psoas abscess, septic pulmonary emboli, empyema and right ventricular mural vegetation -Right psoas abscess was aspirated on 10/28, culture positive for MSSA. -Respiratory culture 10/28 also MSSA -2D echo 10/28 showed possible mitral valve vegetation and moderate pericardial effusion -Underwent TEE on 11/3 showed medium sized, mobile mass attached to the trabeculation in the RV and moderate sized pericardial effusion -Per CT surgery, not a good candidate for thoracotomy -MRI on 11/17 showed increasing psoas abscess s/p catheter drainage of right paraspinal abscess by IR on 11/18 -Catheter removed on 11/27 -ID recommended continue IV Ancef through 12/16 followed by Keflex for 30 days -Patient continued to complain of pain in his back, hip, MRI repeat was obtained on 11/30 showed improvement in the abscess in size of phlegmon with ongoing discitis and osteomyelitis -CT of the right hip showed no evidence of acute right hip fracture or dislocation, mild generalized soft tissue edema, no acute abnormality -At the time of my examination, patient was lying comfortably, stated that he had a good day yesterday but his pain came back with vengeance and asking  to increase his pain medications.  Previous attending physician, Dr. Reggy Eye observation regarding patient's narcotic seeking behavior also reviewed.  Discussed with nursing staff to observe patient's behavior and narcotic demands. Continue IV Ancef  Active problems Acute respiratory failure with hypoxemia secondary to MSSA infection, septic pulmonary emboli, left empyema, left bronchopleural fistula (hydropneumothorax) -Patient was intubated on 10/28-10/31 -Underwent bronchoscopy with BAL, s/p left chest tube, removed on 11/17 -Currently on room air, no acute issues -Chest x-ray 12/2 showed bilateral pulmonary nodules, which are improved compared to prior exam on 11/19.  Small residual left apical pneumothorax   Moderate  pericardial effusion -Seen on 2D echo and TEE, treated conservatively and improving -Last echo on 11/9 showed mild effusion  Essential hypertension -Continue Norvasc, metoprolol  Acute metabolic encephalopathy Due to #1 #2, currently resolved, alert and oriented x3  Severe protein calorie malnutrition, BMI 14.9 -Continue dietary supplements  HCV positive Per ID, seems to have cleared   Pressure injury Sacrum with stage I, Right hip deep tissue injury Wound care per nursing   Code Status: Full code DVT Prophylaxis:  Lovenox  Family Communication: Discussed all imaging results, lab results, explained to the patient    Disposition Plan: Remains inpatient till completes IV antibiotics  Time Spent in minutes 25 minutes  Procedures:  2D echo TEE Intubation He  Consultants:   PCCM, cardiology, ID, palliative care, cardiothoracic surgery  Antimicrobials:   Anti-infectives (From admission, onward)   Start     Dose/Rate Route Frequency Ordered Stop   02/24/19 2200  vancomycin (VANCOCIN) IVPB 1000 mg/200 mL premix  Status:  Discontinued     1,000 mg 200 mL/hr over 60 Minutes Intravenous Every 12 hours 02/24/19 1010 02/24/19 1253   02/24/19 1600  ceFAZolin (ANCEF) IVPB 2g/100 mL premix     2 g 200 mL/hr over 30 Minutes Intravenous Every 8 hours 02/24/19 1253 04/12/19 2359   02/23/19 2200  vancomycin (VANCOCIN) IVPB 750 mg/150 ml premix  Status:  Discontinued     750 mg 150 mL/hr over 60 Minutes Intravenous Every 24 hours 02/23/19 0853 02/23/19 0909   02/23/19 2200  vancomycin (VANCOCIN) IVPB 750 mg/150 ml premix  Status:  Discontinued     750 mg 150 mL/hr over 60 Minutes Intravenous Every 12 hours 02/23/19 0909 02/24/19 1010   02/22/19 2200  vancomycin (VANCOCIN) IVPB 750 mg/150 ml premix  Status:  Discontinued     750 mg 150 mL/hr over 60 Minutes Intravenous Every 24 hours 02/22/19 0826 02/22/19 0827   02/22/19 2200  vancomycin (VANCOCIN) IVPB 1000 mg/200 mL  premix  Status:  Discontinued     1,000 mg 200 mL/hr over 60 Minutes Intravenous Every 24 hours 02/22/19 0827 02/23/19 0853   02/22/19 0900  ceFEPIme (MAXIPIME) 2 g in sodium chloride 0.9 % 100 mL IVPB  Status:  Discontinued     2 g 200 mL/hr over 30 Minutes Intravenous Every 12 hours 02/22/19 0826 02/23/19 1135   02/21/19 2015  ceFEPIme (MAXIPIME) 2 g in sodium chloride 0.9 % 100 mL IVPB     2 g 200 mL/hr over 30 Minutes Intravenous  Once 02/21/19 2013 02/22/19 0031   02/21/19 2015  metroNIDAZOLE (FLAGYL) IVPB 500 mg     500 mg 100 mL/hr over 60 Minutes Intravenous  Once 02/21/19 2013 02/22/19 0031   02/21/19 2015  vancomycin (VANCOCIN) IVPB 1000 mg/200 mL premix     1,000 mg 200 mL/hr over 60 Minutes Intravenous  Once 02/21/19 2013 02/22/19 0200  Medications  Scheduled Meds:  amLODipine  5 mg Oral Daily   Chlorhexidine Gluconate Cloth  6 each Topical Daily   cyclobenzaprine  7.5 mg Oral TID   diclofenac Sodium  2 g Topical QID   enoxaparin (LOVENOX) injection  40 mg Subcutaneous Q24H   feeding supplement (ENSURE ENLIVE)  237 mL Oral BID BM   fentaNYL  1 patch Transdermal Q72H   lidocaine  1 patch Transdermal Q24H   mouth rinse  15 mL Mouth Rinse BID   metoprolol tartrate  100 mg Oral BID   multivitamin with minerals  1 tablet Oral Daily   pantoprazole  40 mg Oral Daily   polyethylene glycol  17 g Oral Daily   Continuous Infusions:  sodium chloride 10 mL/hr at 03/31/19 1800    ceFAZolin (ANCEF) IV 2 g (04/02/19 0604)   PRN Meds:.sodium chloride, acetaminophen **OR** acetaminophen, acetaminophen, clonazePAM, hydrALAZINE, HYDROmorphone, ipratropium-albuterol, ondansetron **OR** ondansetron (ZOFRAN) IV, oxyCODONE, senna-docusate      Subjective:   Dempsey Ahonen was seen and examined today.  States right hip pain bad and radiating to right thigh.  Requesting for pain medications.  Patient denies dizziness, chest pain, shortness of breath, abdominal  pain, N/V.  No acute events overnight.    Objective:   Vitals:   04/01/19 0446 04/01/19 1424 04/01/19 2038 04/02/19 0539  BP: (!) 129/93 (!) 129/93 (!) 144/91 (!) 152/100  Pulse: 86 97 (!) 102 95  Resp: _0 Temp: 97.8 F (36.6 C) 97.8 F (36.6 C) 98.1 F (36.7 C) 98.1 F (36.7 C)  TempSrc: Oral Oral Oral Oral  SpO2: 98% 99% 99% 98%  Weight:      Height:        Intake/Output Summary (Last 24 hours) at 04/02/2019 1146 Last data filed at 04/02/2019 0930 Gross per 24 hour  Intake 2366 ml  Output 3650 ml  Net -1284 ml     Wt Readings from Last 3 Encounters:  03/15/19 49.9 kg  12/21/17 65.3 kg  04/22/16 68 kg   Physical Exam  General: Alert and oriented x 3, NAD  Eyes:   HEENT:  Atraumatic, normocephalic  Cardiovascular: S1 S2 clear, no murmurs, RRR. No pedal edema b/l  Respiratory: CTAB, no wheezing, rales or rhonchi  Gastrointestinal: Soft, nontender, nondistended, NBS  Ext: no pedal edema bilaterally  Neuro: Unable to assess, left subleft lower extremity fine but states pain in the right thigh and hip  Musculoskeletal: No cyanosis, clubbing.  Right hip tender (subjective)  Skin: No rashes  Psych: Normal affect and demeanor, alert and oriented x3    Data Reviewed:  I have personally reviewed following labs and imaging studies  Micro Results No results found for this or any previous visit (from the past 240 hour(s)).  Radiology Reports Dg Chest 1 View  Result Date: 03/16/2019 CLINICAL DATA:  Pleural effusion. EXAM: CHEST  1 VIEW COMPARISON:  March 15, 2019. FINDINGS: The heart size and mediastinal contours are within normal limits. Stable mild left apical and basilar pneumothorax is noted. Stable bilateral nodular densities are noted, some of which are cavitary, consistent with septic emboli. The visualized skeletal structures are unremarkable. IMPRESSION: Stable mild left pneumothorax. Stable bilateral nodular densities are again noted  consistent with septic emboli. Electronically Signed   By: Marijo Conception M.D.   On: 03/16/2019 13:49   Dg Chest 1 View  Result Date: 03/03/2019 CLINICAL DATA:  48 year old male with increased shortness of breath EXAM: CHEST  1  VIEW COMPARISON:  Multiple prior comparison is most recently 03/03/2019, dating to 02/28/2019 FINDINGS: Cardiomediastinal silhouette unchanged in size and contour. Nodules throughout the lungs again noted, compatible with known septic emboli. Unchanged position of thoracostomy tube at the left lung base. The small pneumothorax at the lateral left lung appears larger than the comparison plain film. Similar appearance of coarsened interstitial markings throughout. IMPRESSION: Unchanged thoracostomy tube. The small left pneumothorax appears slightly larger than the comparison plain film though overall has been improving over the comparison chest x-rays dating to 02/28/2019. Multiple pulmonary nodules compatible with known septic emboli. Electronically Signed   By: Corrie Mckusick D.O.   On: 03/03/2019 14:42   Dg Chest 2 View  Result Date: 03/10/2019 CLINICAL DATA:  Chest tube, left pleural effusion EXAM: CHEST - 2 VIEW COMPARISON:  03/08/2019 FINDINGS: Left-sided pigtail chest tube remains in position about the left lung base, with a small, persistent left apical pneumothorax, not significantly changed compared to prior examination. Small, persistent pleural effusion component about the left lung base. Multiple pulmonary nodules are again noted bilaterally, some of which are cavitary. The heart and mediastinum are unremarkable. IMPRESSION: 1. Left-sided pigtail chest tube remains in position about the left lung base, with a small, persistent left apical pneumothorax, not significantly changed compared to prior examination. Small, persistent pleural effusion component about the left lung base. 2. Multiple pulmonary nodules are again noted bilaterally, some of which are cavitary.  Electronically Signed   By: Eddie Candle M.D.   On: 03/10/2019 13:48   Ct Chest W Contrast  Result Date: 03/08/2019 CLINICAL DATA:  Septic pulmonary emboli; history rheumatic fever, hypertension, drug abuse EXAM: CT CHEST WITH CONTRAST TECHNIQUE: Multidetector CT imaging of the chest was performed during intravenous contrast administration. Sagittal and coronal MPR images reconstructed from axial data set. CONTRAST:  75m OMNIPAQUE IOHEXOL 300 MG/ML  SOLN IV COMPARISON:  02/28/2019 FINDINGS: Cardiovascular: Vascular structures patent. No pericardial effusion. Heart normal size. Pulmonary arteries grossly unremarkable for phase of imaging. Mediastinum/Nodes: Base of cervical region normal appearance. Patient cachectic. No definite adenopathy. Lungs/Pleura: Multiple masses are identified throughout both lungs, many cavitary, consistent with septic emboli. Largest RIGHT lung lesion 2.7 x 2.7 cm RIGHT middle lobe image 123. Largest LEFT lung lesion in lingula 4.7 x 4.5 cm image 101. Dependent atelectasis in the posterior lungs bilaterally. Interval decrease in size of significant LEFT pneumothorax component. Partially loculated LEFT pleural effusion with air-fluid level. Upper Abdomen: Small cyst upper LEFT kidney. Remaining visualized upper abdomen unremarkable. Musculoskeletal: No acute osseous lesions. IMPRESSION: Persistent loculated LEFT hydropneumothorax, with decrease in pneumothorax component since previous exam. Septic emboli throughout both lungs, little changed. Electronically Signed   By: MLavonia DanaM.D.   On: 03/08/2019 10:37   Ct Lumbar Spine W Contrast  Result Date: 03/20/2019 CLINICAL DATA:  Abdominal pain and fever. Abscess. At the drug abuse. Progressively worse back pain and bilateral lower extremity weakness. EXAM: CT LUMBAR SPINE WITH CONTRAST TECHNIQUE: Multidetector CT imaging of the lumbar spine was performed with intravenous contrast administration. CONTRAST:  1030mOMNIPAQUE IOHEXOL  300 MG/ML  SOLN COMPARISON:  MRI of the lumbar spine 03/14/2019. CT guided drain placement 03/15/2019 FINDINGS: Segmentation: 5 non rib-bearing lumbar type vertebral bodies are present. The lowest fully formed vertebral body is L5. Alignment: No significant listhesis is present. There is straightening of the normal lumbar lordosis. Rightward curvature is present at the thoracolumbar junction. Compensatory leftward curvature is present at L5-S1. Vertebrae: Lytic changes are noted at the  endplates of U0-4 with some collapse on right. There is no progression collapse. Vertebral body heights are otherwise maintained. No other focal lytic or blastic lesions are present. Paraspinal and other soft tissues: Right paraspinal drain is in place. The largest collection collapsed. A more posterior and slightly superior peripherally enhancing collection measures 12 mm. No significant adenopathy is present. Atherosclerotic changes are noted in the aorta without aneurysm. Disc levels: L1-2: Negative. L2-3: No significant erosive changes are present. Leftward disc protrusion is noted. L3-4: A leftward disc protrusion present. Mild facet hypertrophy is noted bilaterally. L4-5: A broad-based disc protrusion is associated with the infected disc. Moderate foraminal stenosis is again seen bilaterally. L5-S1: Mild disc bulging is present. Facet hypertrophy is worse on the right. IMPRESSION: 1. The right paraspinous scratched at the larger of the 2 right paraspinous fluid collections is drained. 2. The smaller, more superior posterior collection remains, measuring up to 12 mm. 3. Stable lytic endplate changes associated with disc osteomyelitis at L4-5. Collapse is worse on the right, stable from prior exam. 4. Subtle endplate changes posteriorly at L2-3 and at the right facet of L5-S1 without progression. 5.  Aortic Atherosclerosis (ICD10-I70.0). Electronically Signed   By: San Morelle M.D.   On: 03/20/2019 15:41   Mr Lumbar  Spine W Wo Contrast  Result Date: 03/27/2019 CLINICAL DATA:  Back pain, infection. EXAM: MRI LUMBAR SPINE WITHOUT AND WITH CONTRAST TECHNIQUE: Multiplanar and multiecho pulse sequences of the lumbar spine were obtained without and with intravenous contrast. CONTRAST:  44m GADAVIST GADOBUTROL 1 MMOL/ML IV SOLN COMPARISON:  CT scan 03/20/2019 MRI from 03/14/2019 FINDINGS: Segmentation: The lowest lumbar type non-rib-bearing vertebra is labeled as L5. Alignment:  No vertebral subluxation is observed. Vertebrae: Again observed discitis osteomyelitis at the L4-5 level with endplate edema and sclerosis, and irregular rim enhancing collections along the anterior intervertebral disc space. There is bilateral facet edema and enhancement at this level, and septic arthritis of the facet joints is not excluded given the appearance. Endplate enhancement and type 1 degenerative endplate findings at LV4-0 with subtle accentuated T2 signal in the L2-3 intervertebral disc, a second site of early discitis is likely. Stable 6 mm focus of T2 hyperintensity in the T12 vertebral body, probably a small hemangioma or similar benign lesion. Left eccentric type 1 degenerative endplate findings at [id number] Conus medullaris and cauda equina: Conus extends to the L1-2. Paraspinal and other soft tissues: Small abscesses are present along the right psoas muscle with surrounding myositis. This includes a 0.9 by 1.0 by 2.0 cm abscess on image 34/6 anteriorly, and a 0.9 cm posterior right psoas abscess on image 30/6. Paraspinal phlegmon is most prominent at the L4-5 level and surrounds the vertebral level, and also at the L2-3 level and adjacent levels up to T12. The previous enhancing epidural phlegmon at the L4-5 level is less striking than on the prior exam. There is abnormal enhancement extending in the L4-5 and to a lesser extent L5-S1 neural foramina bilaterally. I do not see a well-defined epidural abscess. Disc levels: T12-L1:  Unremarkable. L1-2: No impingement. L2-3: Borderline left subarticular lateral recess stenosis due to left eccentric intervertebral spurring and facet spurring. L3-4: Mild left subarticular lateral recess stenosis and mild left foraminal stenosis due to left lateral recess and foraminal disc protrusion, disc bulge, and mild facet arthropathy. L4-5: At least mild bilateral foraminal stenosis due to facet and intervertebral spurring. L5-S1: Borderline bilateral foraminal stenosis due to disc bulge, intervertebral spurring, and facet arthropathy. IMPRESSION: 1.  Active discitis-osteomyelitis at L4-5 and L2-3, with small right psoas muscle abscesses (reduced in size from prior exam) and considerable paraspinal phlegmon tracking between T12 L5. The amount of anterior epidural phlegmon at the L4-5 level is improved, but there is continued abnormal enhancement in both neural foramina at L4-5 and L5-S1. In addition, the facet joints at L4-5 demonstrate severe arthropathy and septic facet joints are not excluded. 2. Lumbar spondylosis and degenerative disc disease with mild impingement at L3-4 and L4-5. Electronically Signed   By: Van Clines M.D.   On: 03/27/2019 18:48   Mr Lumbar Spine W Wo Contrast  Result Date: 03/14/2019 CLINICAL DATA:  Back pain, infection suspected. Patient with disseminated MSSA infection multiple abscesses. Worsening lower back pain not responding to pain management. Follow-up known osteomyelitis in spine. EXAM: MRI LUMBAR SPINE WITHOUT AND WITH CONTRAST TECHNIQUE: Multiplanar and multiecho pulse sequences of the lumbar spine were obtained without and with intravenous contrast. CONTRAST:  27m GADAVIST GADOBUTROL 1 MMOL/ML IV SOLN COMPARISON:  Lumbar spine MRI 02/21/2019 FINDINGS: Segmentation: For the purposes of this dictation, five lumbar vertebrae are assumed and the caudal most well-formed intervertebral disc is designated L5-S1. Alignment: Straightening of the expected lumbar  lordosis. No significant spondylolisthesis. Lumbar levocurvature. Vertebrae: Vertebral body height is maintained. Endplate irregularity at L4-L5 has increased as compared to prior examination. Again demonstrated there is extensive abnormal T2 hyperintense signal throughout the L4 and L5 vertebrae extending to the disc space with associated abnormal enhancement. Findings consistent with discitis/osteomyelitis. As before, there is fluid within the bilateral L4-L5 facet joints (greater on the right) and septic arthritis is difficult to exclude (series 11, image 29). More conspicuous than on prior examination, there is abnormal T2 hyperintensity and enhancement within the L2 and L3 vertebrae along the disc space. Findings highly suspicious for an additional level of discitis/osteomyelitis. Conus medullaris and cauda equina: Conus extends to the L2 level. No signal abnormality within the visualized distal spinal cord. New from prior MRI, there is ventral epidural phlegmon at the L4-L5 level measuring 0.6 cm in AP dimension (series 10, image 7). Paraspinal and other soft tissues: There is nonspecific soft tissue edema and enhancement within the lumbar paraspinal soft tissues, greatest at L4-L5 level. More conspicuous than on prior examination. There is abnormal paraspinal enhancing soft tissue consistent with phlegmon at the L4-L5 level extending to involve both psoas muscles. This extends into the bilateral L4-L5 neural foramina and partially into the bilateral L5-S1 neural foramina. Again demonstrated are bilateral psoas abscesses, the largest on the right measuring 2.7 x 2.4 x 4.4 cm (series 6, image 33) (series 9, image 13). Disc levels: T12-L1: No disc herniation. No significant canal or foraminal stenosis. L1-L2: No disc herniation. No significant canal or foraminal stenosis. L2-L3: Disc bulge with endplate spurring. New from prior examination there is a left center/subarticular caudally migrated disc extrusion.  Mild facet arthrosis. The disc extrusion contributes to significant left subarticular stenosis with encroachment upon the descending left L3 nerve root. No significant central canal stenosis or neural foraminal narrowing. L3-L4: Disc bulge asymmetric to the left with associated endplate spurring. Mild facet arthrosis. Mild left subarticular and bilateral neural foraminal narrowing. No significant central canal stenosis. L4-L5: Disc bulge with endplate remodeling. Ventral epidural phlegmon as described. No significant spinal canal stenosis. As before, abnormal enhancement likely reflecting phlegmon extends into both neural foramina with severe bilateral neural foraminal narrowing. L5-S1: Small disc bulge. Mild facet arthrosis. No significant spinal canal stenosis. Abnormal soft tissue enhancement likely  reflecting phlegmon extends into the bilateral neural foramina. IMPRESSION: 1. Redemonstrated findings of L4-L5 discitis/osteomyelitis. Endplate irregularity at this level has progressed. Persistent fluid within the bilateral L4-L5 facet joints (greater on the right). For which septic arthritis is difficult to exclude. New ventral epidural phlegmon at this level measuring 0.6 cm in AP dimension without significant canal stenosis. Persistent extensive paraspinal phlegmon at this level extending into the bilateral L4-L5 neural foramina with resultant severe neural foraminal narrowing. Phlegmon also extends partially into the bilateral L5-S1 neural foramina. 2. Signal abnormality and abnormal enhancement now present at the L2-L3 level also suspicious for discitis/osteomyelitis. 3. Redemonstrated bilateral psoas abscesses, the largest on the right measuring 2.7 x 2.4 x 4.4 cm. 4. New L2-L3 left center/subarticular caudally migrated disc extrusion. Resultant left subarticular stenosis with encroachment upon the descending left L3 nerve root. Electronically Signed   By: Kellie Simmering DO   On: 03/14/2019 13:07   Ct Hip  Right Wo Contrast  Result Date: 03/31/2019 CLINICAL DATA:  Severe right hip pain for 5 days. No known injury. Suspected hip fracture. EXAM: CT OF THE RIGHT HIP WITHOUT CONTRAST TECHNIQUE: Multidetector CT imaging of the right hip was performed according to the standard protocol. Multiplanar CT image reconstructions were also generated. COMPARISON:  Pelvic CT 06/14/2006. FINDINGS: Bones/Joint/Cartilage Examination is targeted to the right hip and inferior right hemipelvis. There is no evidence of acute fracture, dislocation or femoral head avascular necrosis. The bones appear adequately mineralized. There is no significant right hip arthropathy or joint effusion. Ligaments Suboptimally assessed by CT. Muscles and Tendons No focal muscular abnormalities are identified. Soft tissues There is mild generalized soft tissue edema without focal fluid collection, foreign body or soft tissue emphysema. The visualized internal pelvic contents appear unremarkable. IMPRESSION: 1. No evidence of acute right hip fracture or dislocation. 2. Mild generalized soft tissue edema. Electronically Signed   By: Richardean Sale M.D.   On: 03/31/2019 15:16   Dg Chest Port 1 View  Result Date: 03/29/2019 CLINICAL DATA:  Fever, heroin abuse EXAM: PORTABLE CHEST 1 VIEW COMPARISON:  03/16/2019 FINDINGS: Multiple bilateral pulmonary nodules which have improved compared with the prior examination of 03/16/2019. No new focal consolidation. No pleural effusion. Small residual left apical pneumothorax. Stable cardiomediastinal silhouette. No aggressive osseous lesion. IMPRESSION: 1. Bilateral pulmonary nodules which are improved compared with the prior examination of 03/16/2019. Small residual left apical pneumothorax. Electronically Signed   By: Kathreen Devoid   On: 03/29/2019 15:32   Dg Chest Port 1 View  Result Date: 03/15/2019 CLINICAL DATA:  Hydropneumothorax. EXAM: PORTABLE CHEST 1 VIEW COMPARISON:  March 14, 2019. FINDINGS: The  heart size and mediastinal contours are within normal limits. Stable mild left apical pneumothorax is noted. Stable bilateral nodular densities are noted some of which are cavitary, consistent with septic emboli. The visualized skeletal structures are unremarkable. IMPRESSION: Stable mild left apical pneumothorax is noted. Stable bilateral nodular densities are noted, some of which are cavitary, consistent with septic emboli. Electronically Signed   By: Marijo Conception M.D.   On: 03/15/2019 13:03   Dg Chest Port 1 View  Result Date: 03/14/2019 CLINICAL DATA:  Chest tube removal EXAM: PORTABLE CHEST 1 VIEW COMPARISON:  03/14/2019, 03/13/2019, 03/10/2019 FINDINGS: Interim removal of left-sided chest tube. Overall no significant interval change in size of small residual left apicolateral pneumothorax. There is probable trace pneumothorax at the left CP angle. Stable cardiomediastinal silhouette. Multiple bilateral nodules and cavitary lesions as before. IMPRESSION: 1. Removal of  left chest tube. 2. Overall no significant interval change in size of small residual left apicolateral pneumothorax. Now seen is a small amount of pneumothorax component at the left CP angle. 3. Stable bilateral lung nodules and cavitary lesions. Electronically Signed   By: Donavan Foil M.D.   On: 03/14/2019 16:55   Dg Chest Port 1 View  Result Date: 03/14/2019 CLINICAL DATA:  Hydropneumothorax EXAM: PORTABLE CHEST 1 VIEW COMPARISON:  03/13/2019 FINDINGS: No significant change in AP portable examination with a persistent small left lateral and apical pneumothorax component and a left-sided pigtail chest tube in position about the lateral lung base. Probable small persistent effusion component. No change in multiple masses and nodules bilaterally, some of which are cavitary. The heart and mediastinum are unremarkable. IMPRESSION: 1. No significant change in AP portable examination with a persistent small left lateral and apical  pneumothorax component and a left-sided pigtail chest tube in position about the lateral lung base. Probable small persistent effusion component. 2. No change in multiple masses and nodules bilaterally, some of which are cavitary. Electronically Signed   By: Eddie Candle M.D.   On: 03/14/2019 09:58   Dg Chest Port 1 View  Result Date: 03/13/2019 CLINICAL DATA:  48 year male with shortness of breath and left-sided pneumothorax. History of septic emboli. EXAM: PORTABLE CHEST 1 VIEW COMPARISON:  Chest radiograph dated 03/10/2019. FINDINGS: Left-sided chest tube in similar position. Overall slight interval increase in the size of the left pneumothorax since the radiograph of 03/10/2019. Continued follow-up recommended. No mediastinal shift. Bilateral nodular and confluent airspace densities similar to prior radiograph. There is a small left pleural effusion. Stable cardiac silhouette. No acute osseous pathology. IMPRESSION: 1. Slight interval increase in the size of the left pneumothorax since the radiograph of 03/10/19. Continued follow-up recommended. 2. Bilateral nodular and confluent airspace densities similar to prior radiograph. Electronically Signed   By: Anner Crete M.D.   On: 03/13/2019 12:26   Dg Chest Port 1 View  Result Date: 03/07/2019 CLINICAL DATA:  Left chest tube in place for pneumothorax. EXAM: PORTABLE CHEST 1 VIEW COMPARISON:  Single-view of the chest 03/04/2019 03/03/2019. CT chest 02/28/2019. History of septic emboli. FINDINGS: Pigtail catheter remains in place in the lower left chest. Left pneumothorax seen on the most recent examination has slightly increased in size. Multiple cavitary nodules are again seen bilaterally. No right pneumothorax. Heart size is normal. IMPRESSION: Small left pneumothorax seen on the most recent examination has mildly increased in size. Left chest tube remains in place. Multiple cavitary nodules bilaterally consistent with septic emboli.  Electronically Signed   By: Inge Rise M.D.   On: 03/07/2019 10:33   Dg Chest Port 1 View  Result Date: 03/04/2019 CLINICAL DATA:  Pneumothorax EXAM: PORTABLE CHEST 1 VIEW COMPARISON:  Chest radiograph 03/03/2019. FINDINGS: Monitoring leads overlie the patient. Stable cardiac and mediastinal contours. Scattered nodules demonstrated throughout the lungs bilaterally. Similar heterogeneous opacities left lung base. Left chest tube remains in position. Similar small pneumothorax within the upper lateral left hemithorax. Thoracic spine degenerative changes. IMPRESSION: 1. Similar small pneumothorax within the upper lateral left hemithorax with chest tube in place. 2. Scattered nodules throughout the lungs bilaterally. 3. Similar heterogeneous opacities left lung base. Electronically Signed   By: Lovey Newcomer M.D.   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 Patient presents today for CT-guided aspiration and/or  drainage catheter placement. No, patient previously underwent CT-guided paraspinal aspiration on 02/22/2019. EXAM: CT IMAGE GUIDED DRAINAGE BY PERCUTANEOUS CATHETER COMPARISON:  Lumbar spine MRI-03/14/2019; CT-guided paraspinal abscess aspiration-02/22/2019 MEDICATIONS: The patient is currently admitted to the hospital and receiving intravenous antibiotics. The antibiotics were administered within an appropriate time frame prior to the initiation of the procedure. ANESTHESIA/SEDATION: Moderate (conscious) sedation was employed during this procedure. A total of Benadryl 25 mg IV; versed 3 mg and Fentanyl 100 mcg was administered intravenously. Moderate Sedation Time: 14 minutes. The patient's level of consciousness and vital signs were monitored continuously by radiology nursing throughout the procedure under my direct supervision. CONTRAST:  None  COMPLICATIONS: None immediate. PROCEDURE: Informed written consent was obtained from the patient after a discussion of the risks, benefits and alternatives to treatment. The patient was placed prone on the CT gantry and a pre procedural CT was performed re-demonstrating the known abscess/fluid collection within the inferior aspect the right iliopsoas musculature with dominant component measuring approximately 2.6 x 2.4 cm (25, series 2) the procedure was planned. A timeout was performed prior to the initiation of the procedure. The skin overlying the posterior inferior back was prepped and draped in the usual sterile fashion. The overlying soft tissues were anesthetized with 1% lidocaine with epinephrine. Appropriate trajectory was planned with the use of a 22 gauge spinal needle. An 18 gauge trocar needle was advanced into the abscess/fluid collection and a short Amplatz super stiff wire was coiled within the collection. Appropriate positioning was confirmed with a limited CT scan. The tract was serially dilated allowing placement of a 10 [place] all-purpose drainage catheter. Appropriate positioning was confirmed with a limited postprocedural CT scan. Approximately 15 ml of purulent fluid was aspirated. The tube was connected to a JP bulb and sutured in place. A dressing was placed. The patient tolerated the procedure well without immediate post procedural complication. IMPRESSION: Successful CT guided placement of a 10 French all purpose drain catheter into the recurrent right paraspinal abscess with aspiration of 15 mL of purulent fluid. Samples were sent to the laboratory as requested by the ordering clinical team. PLAN: - Flush the percutaneous drainage catheter with 10 cc of normal saline b.i.d. - Record daily output from the percutaneous drainage catheter. - once drainage catheter output is less than 10 cc per day (excluding flush), repeat contrast-enhanced CT scan of the abdomen and pelvis is recommended to  ensure abscess resolution. Ultimately, the percutaneous drainage catheter may be removed once the abscess has resolved on CT imaging, not requiring dedicated percutaneous drainage catheter injection given lack clinical concern for an enteric fistula. Electronically Signed   By: Sandi Mariscal M.D.   On: 03/15/2019 17:06   Vas [place] Upper Extremity Venous Duplex  Result Date: 03/07/2019 UPPER VENOUS STUDY  Indications: Pain Comparison Study: no prior Performing Technologist: Abram Sander RVS  Examination Guidelines: A complete evaluation includes B-mode imaging, spectral Doppler, color Doppler, and power Doppler as needed of all accessible portions of each vessel. Bilateral testing is considered an integral part of a complete examination. Limited examinations for reoccurring indications may be performed as noted.  Right Findings: +----------+------------+---------+-----------+----------+-------+  RIGHT      Compressible Phasicity Spontaneous Properties Summary  +----------+------------+---------+-----------+----------+-------+  Subclavian                 Yes        Yes                         +----------+------------+---------+-----------+----------+-------+  Left Findings: +----------+------------+---------+-----------+----------+-------+  LEFT       Compressible Phasicity Spontaneous Properties Summary  +----------+------------+---------+-----------+----------+-------+  IJV            Full        Yes        Yes                         +----------+------------+---------+-----------+----------+-------+  Subclavian     Full        Yes        Yes                         +----------+------------+---------+-----------+----------+-------+  Axillary       Full        Yes        Yes                         +----------+------------+---------+-----------+----------+-------+  Brachial       Full        Yes        Yes                         +----------+------------+---------+-----------+----------+-------+  Radial         Full                                                +----------+------------+---------+-----------+----------+-------+  Ulnar          Full                                               +----------+------------+---------+-----------+----------+-------+  Cephalic       Full                                               +----------+------------+---------+-----------+----------+-------+  Basilic        Full                                               +----------+------------+---------+-----------+----------+-------+  Summary:  Right: No evidence of thrombosis in the subclavian.  Left: No evidence of deep vein thrombosis in the upper extremity. No evidence of superficial vein thrombosis in the upper extremity. No evidence of thrombosis in the subclavian.  *See table(s) above for measurements and observations.  Diagnosing physician: Harold Barban MD Electronically signed by Harold Barban MD on 03/07/2019 at 5:52:52 PM.    Final     Lab Data:  CBC: No results for input(s): WBC, NEUTROABS, HGB, HCT, MCV, PLT in the last 168 hours. Basic Metabolic Panel: Recent Labs  Lab 03/31/19 1020  NA 137  K 3.7  CL 100  CO2 25  GLUCOSE 166*  BUN 18  CREATININE 0.51*  CALCIUM 8.5*   GFR: Estimated Creatinine Clearance: 78.8 mL/min (A) (by C-G formula based on SCr of 0.51 mg/dL (L)). Liver Function Tests: No  results for input(s): AST, ALT, ALKPHOS, BILITOT, PROT, ALBUMIN in the last 168 hours. No results for input(s): LIPASE, AMYLASE in the last 168 hours. No results for input(s): AMMONIA in the last 168 hours. Coagulation Profile: No results for input(s): INR, PROTIME in the last 168 hours. Cardiac Enzymes: No results for input(s): CKTOTAL, CKMB, CKMBINDEX, TROPONINI in the last 168 hours. BNP (last 3 results) No results for input(s): PROBNP in the last 8760 hours. HbA1C: No results for input(s): HGBA1C in the last 72 hours. CBG: Recent Labs  Lab 03/31/19 0748 03/31/19 1131 03/31/19 1622 03/31/19 2233  04/01/19 0737  GLUCAP 96 109* 100* 131* 91   Lipid Profile: No results for input(s): CHOL, HDL, LDLCALC, TRIG, CHOLHDL, LDLDIRECT in the last 72 hours. Thyroid Function Tests: No results for input(s): TSH, T4TOTAL, FREET4, T3FREE, THYROIDAB in the last 72 hours. Anemia Panel: No results for input(s): VITAMINB12, FOLATE, FERRITIN, TIBC, IRON, RETICCTPCT in the last 72 hours. Urine analysis:    Component Value Date/Time   BILIRUBINUR neg 12/21/2017 1551   PROTEINUR Negative 12/21/2017 1551   UROBILINOGEN 0.2 12/21/2017 1551   NITRITE neg 12/21/2017 1551   LEUKOCYTESUR Negative 12/21/2017 1551     Naoki Migliaccio M.D. [facility] 04/02/2019, 11:46 AM   Call night coverage person covering after 7pm

## 2019-04-02 NOTE — Progress Notes (Signed)
During my hourly rounds the pt was resting and expressed that he is experiencing right hip pain. Pt stated this morning that he was unable to move his legs but he is now sitting with both knees bent and he was able to get up to the bedside commode. Will continue to monitor for pain.

## 2019-04-03 LAB — BASIC METABOLIC PANEL
Anion gap: 11 (ref 5–15)
BUN: 14 mg/dL (ref 6–20)
CO2: 29 mmol/L (ref 22–32)
Calcium: 9.5 mg/dL (ref 8.9–10.3)
Chloride: 98 mmol/L (ref 98–111)
Creatinine, Ser: 0.39 mg/dL — ABNORMAL LOW (ref 0.61–1.24)
GFR calc Af Amer: >60 mL/min (ref >60)
GFR calc non Af Amer: >60 mL/min (ref >60)
Glucose, Bld: 96 mg/dL (ref 70–99)
Potassium: 4.1 mmol/L (ref 3.5–5.1)
Sodium: 138 mmol/L (ref 135–145)

## 2019-04-03 LAB — CBC
HCT: 33.4 % — ABNORMAL LOW (ref 39.0–52.0)
Hemoglobin: 10.2 g/dL — ABNORMAL LOW (ref 13.0–17.0)
MCH: 29.3 pg (ref 26.0–34.0)
MCHC: 30.5 g/dL (ref 30.0–36.0)
MCV: 96 fL (ref 80.0–100.0)
Platelets: 385 10*3/uL (ref 150–400)
RBC: 3.48 MIL/uL — ABNORMAL LOW (ref 4.22–5.81)
RDW: 16.9 % — ABNORMAL HIGH (ref 11.5–15.5)
WBC: 7 10*3/uL (ref 4.0–10.5)
nRBC: 0 % (ref 0.0–0.2)

## 2019-04-03 NOTE — Progress Notes (Signed)
Gave patient 2 MG of Dilaudid by mouth at 1009 this morning. During hourly rounds I followed up with patient pain level. Patient reports pain is a 5 at this time he is down from an 8 that was reported at 1000 before Dilaudid was given. Patient was able to tolerate turning to remove Lidocaine patch. Patient is lying supine watching TV when I left the room.

## 2019-04-03 NOTE — Progress Notes (Signed)
[facility]                                                                              Patient Demographics  Franklin Anderson, is a 48 y.o. male, DOB - Mar 07, 1971, WCH:[id number]  Admit date - 02/21/2019   Admitting Physician Etta Quill, DO  Outpatient Primary MD for the patient is Mack Hook, MD  Outpatient specialists:   LOS - 40  days   Medical records reviewed and are as summarized below:    Chief Complaint  Patient presents with   Back Pain   Heroin Detox   Weight Loss       Brief summary   Franklin Anderson 48 y.o.malewith medical history significant of rheumatic fever, HTN, ongoingIV heroin abuse, nicotine abuse, presented to the ED on 10/27 with 3 week course of progressively worsening back pain and BLE weakness associated with some bowel incontinence and trouble walking. Last use of heroin was on the day of admission. In ED > Found to have sepsis with Tm 100, HR 126, RR 31, WBC 22.4k,AKI with BUN 111 and creat 1.7. Imaging studies revealed osteomyelitis and diskitis of L4-L5,septic pulmonary emboli and psoas abscesses. He was started on IV antibiotics and admitted to [facility].  On 10/28 was found to be obtunded and dyspneic and was intubated. Blood cultures from admission grew out MSSA. Subsequently found to have RV vegetation,  left sided empyema (MSSA) and  a left pneumothorax for which he received a chest tube.  He also received a drain in his psoas abscess.  Both chest tube and drain have been removed and ID has signed off with instructions to continue IV antibiotics until 12/16. In the meantime he has not been able to participate with physical therapy due to ongoing complaints of pain.  He is on a number of different narcotics which apparently are not strong enough to control his current pain which appears to move from day-to-day.    Assessment & Plan   Principal problem Sepsis with MSSA bacteremia, L4-L5 osteomyelitis and discitis,  epidural abscess, psoas abscess, septic pulmonary emboli, empyema and right ventricular mural vegetation -Right psoas abscess was aspirated on 10/28, culture positive for MSSA. -Respiratory culture 10/28 also MSSA -2D echo 10/28 showed possible mitral valve vegetation and moderate pericardial effusion -Underwent TEE on 11/3 showed medium sized, mobile mass attached to the trabeculation in the RV and moderate sized pericardial effusion -Per CT surgery, not a good candidate for thoracotomy -MRI on 11/17 showed increasing psoas abscess s/p catheter drainage of right paraspinal abscess by IR on 11/18 -Catheter removed on 11/27 -ID recommended continue IV Ancef through 12/16 followed by Keflex for 30 days -Patient had continued to complain of pain in his back, hip, MRI repeat was obtained on 11/30 showed improvement in the abscess in size of phlegmon with ongoing discitis and osteomyelitis -CT of the right hip showed no evidence of acute right hip fracture or dislocation, mild generalized soft tissue edema, no acute abnormality -Continue IV Ancef -At the time of my encounter, patient was lying supine with knees bent, texting on his phone.  However, then started reporting that  he was in lot of pain, had a rough night.  Active problems Acute respiratory failure with hypoxemia secondary to MSSA infection, septic pulmonary emboli, left empyema, left bronchopleural fistula (hydropneumothorax) -Patient was intubated on 10/28-10/31 -Underwent bronchoscopy with BAL, s/p left chest tube, removed on 11/17 -Currently on room air, no acute issues -Chest x-ray 12/2 showed bilateral pulmonary nodules, which are improved compared to prior exam on 11/19.  Small residual left apical pneumothorax -Avoid increasing narcotics due to risk of respiratory depression.   Moderate pericardial effusion -Seen on 2D echo and TEE, treated conservatively and improving -Last echo on 11/9 showed mild effusion  Essential  hypertension -BP stable, continue Norvasc, metoprolol  Acute metabolic encephalopathy Due to #1 #2, resolved, alert and oriented x3  Severe protein calorie malnutrition, BMI 14.9 -Continue dietary supplements  HCV positive Per ID, seems to have cleared   Pressure injury Sacrum with stage I, Right hip deep tissue injury Wound care per nursing   Code Status: Full code DVT Prophylaxis:  Lovenox  Family Communication: Discussed all imaging results, lab results, explained to the patient    Disposition Plan: Remains inpatient till completes IV antibiotics, completes on 12/16  Time Spent in minutes 25 minutes  Procedures:  2D echo TEE Intubation He  Consultants:   PCCM, cardiology, ID, palliative care, cardiothoracic surgery  Antimicrobials:   Anti-infectives (From admission, onward)   Start     Dose/Rate Route Frequency Ordered Stop   02/24/19 2200  vancomycin (VANCOCIN) IVPB 1000 mg/200 mL premix  Status:  Discontinued     1,000 mg 200 mL/hr over 60 Minutes Intravenous Every 12 hours 02/24/19 1010 02/24/19 1253   02/24/19 1600  ceFAZolin (ANCEF) IVPB 2g/100 mL premix     2 g 200 mL/hr over 30 Minutes Intravenous Every 8 hours 02/24/19 1253 04/12/19 2359   02/23/19 2200  vancomycin (VANCOCIN) IVPB 750 mg/150 ml premix  Status:  Discontinued     750 mg 150 mL/hr over 60 Minutes Intravenous Every 24 hours 02/23/19 0853 02/23/19 0909   02/23/19 2200  vancomycin (VANCOCIN) IVPB 750 mg/150 ml premix  Status:  Discontinued     750 mg 150 mL/hr over 60 Minutes Intravenous Every 12 hours 02/23/19 0909 02/24/19 1010   02/22/19 2200  vancomycin (VANCOCIN) IVPB 750 mg/150 ml premix  Status:  Discontinued     750 mg 150 mL/hr over 60 Minutes Intravenous Every 24 hours 02/22/19 0826 02/22/19 0827   02/22/19 2200  vancomycin (VANCOCIN) IVPB 1000 mg/200 mL premix  Status:  Discontinued     1,000 mg 200 mL/hr over 60 Minutes Intravenous Every 24 hours 02/22/19 0827 02/23/19  0853   02/22/19 0900  ceFEPIme (MAXIPIME) 2 g in sodium chloride 0.9 % 100 mL IVPB  Status:  Discontinued     2 g 200 mL/hr over 30 Minutes Intravenous Every 12 hours 02/22/19 0826 02/23/19 1135   02/21/19 2015  ceFEPIme (MAXIPIME) 2 g in sodium chloride 0.9 % 100 mL IVPB     2 g 200 mL/hr over 30 Minutes Intravenous  Once 02/21/19 2013 02/22/19 0031   02/21/19 2015  metroNIDAZOLE (FLAGYL) IVPB 500 mg     500 mg 100 mL/hr over 60 Minutes Intravenous  Once 02/21/19 2013 02/22/19 0031   02/21/19 2015  vancomycin (VANCOCIN) IVPB 1000 mg/200 mL premix     1,000 mg 200 mL/hr over 60 Minutes Intravenous  Once 02/21/19 2013 02/22/19 0200         Medications  Scheduled Meds:  amLODipine  5 mg Oral Daily   Chlorhexidine Gluconate Cloth  6 each Topical Daily   cyclobenzaprine  7.5 mg Oral TID   diclofenac Sodium  2 g Topical QID   enoxaparin (LOVENOX) injection  40 mg Subcutaneous Q24H   feeding supplement (ENSURE ENLIVE)  237 mL Oral BID BM   fentaNYL  1 patch Transdermal Q72H   lidocaine  1 patch Transdermal Q24H   mouth rinse  15 mL Mouth Rinse BID   metoprolol tartrate  100 mg Oral BID   multivitamin with minerals  1 tablet Oral Daily   pantoprazole  40 mg Oral Daily   polyethylene glycol  17 g Oral Daily   Continuous Infusions:  sodium chloride 10 mL/hr at 03/31/19 1800    ceFAZolin (ANCEF) IV 2 g (04/03/19 0605)   PRN Meds:.sodium chloride, acetaminophen **OR** acetaminophen, acetaminophen, clonazePAM, hydrALAZINE, HYDROmorphone, ipratropium-albuterol, ondansetron **OR** ondansetron (ZOFRAN) IV, oxyCODONE, senna-docusate      Subjective:   Franklin Anderson was seen and examined today.  At the time of my encounter, patient was lying supine with his knees bent and texting on his phone.  However then started requesting pain medications, stating he was feeling miserable and had a rough night.  Patient denies dizziness, chest pain, shortness of breath, abdominal  pain, N/V.  No acute events overnight.    Objective:   Vitals:   04/02/19 1431 04/02/19 2142 04/03/19 0611 04/03/19 0954  BP: 132/90 (!) 153/103 (!) 146/102 (!) 128/92  Pulse: 92 95 96 (!) 109  Resp: _0 Temp: 97.9 F (36.6 C) 97.9 F (36.6 C) 98.3 F (36.8 C)   TempSrc: Oral Oral Oral   SpO2: 98% 100% 97%   Weight:      Height:        Intake/Output Summary (Last 24 hours) at 04/03/2019 1321 Last data filed at 04/03/2019 0200 Gross per 24 hour  Intake 3718.37 ml  Output 1450 ml  Net 2268.37 ml     Wt Readings from Last 3 Encounters:  03/15/19 49.9 kg  12/21/17 65.3 kg  04/22/16 68 kg   Physical Exam  General: Alert and oriented x 3, NAD, appears comfortable, lying supine with knees bent  Eyes:   HEENT:  Atraumatic, normocephalic  Cardiovascular: S1 S2 clear, RRR. No pedal edema b/l  Respiratory: CTAB, no wheezing, rales or rhonchi  Gastrointestinal: Soft, nontender, nondistended, NBS  Ext: no pedal edema bilaterally  Neuro: no new deficits  Musculoskeletal: No cyanosis, clubbing  Skin: No rashes  Psych: Normal affect and demeanor, alert and oriented x3     Data Reviewed:  I have personally reviewed following labs and imaging studies  Micro Results No results found for this or any previous visit (from the past 240 hour(s)).  Radiology Reports Dg Chest 1 View  Result Date: 03/16/2019 CLINICAL DATA:  Pleural effusion. EXAM: CHEST  1 VIEW COMPARISON:  March 15, 2019. FINDINGS: The heart size and mediastinal contours are within normal limits. Stable mild left apical and basilar pneumothorax is noted. Stable bilateral nodular densities are noted, some of which are cavitary, consistent with septic emboli. The visualized skeletal structures are unremarkable. IMPRESSION: Stable mild left pneumothorax. Stable bilateral nodular densities are again noted consistent with septic emboli. Electronically Signed   By: Marijo Conception M.D.   On: 03/16/2019  13:49   Dg Chest 2 View  Result Date: 03/10/2019 CLINICAL DATA:  Chest tube, left pleural effusion EXAM: CHEST - 2 VIEW COMPARISON:  03/08/2019 FINDINGS: Left-sided pigtail chest tube remains in position about the left lung base, with a small, persistent left apical pneumothorax, not significantly changed compared to prior examination. Small, persistent pleural effusion component about the left lung base. Multiple pulmonary nodules are again noted bilaterally, some of which are cavitary. The heart and mediastinum are unremarkable. IMPRESSION: 1. Left-sided pigtail chest tube remains in position about the left lung base, with a small, persistent left apical pneumothorax, not significantly changed compared to prior examination. Small, persistent pleural effusion component about the left lung base. 2. Multiple pulmonary nodules are again noted bilaterally, some of which are cavitary. Electronically Signed   By: Eddie Candle M.D.   On: 03/10/2019 13:48   Ct Chest W Contrast  Result Date: 03/08/2019 CLINICAL DATA:  Septic pulmonary emboli; history rheumatic fever, hypertension, drug abuse EXAM: CT CHEST WITH CONTRAST TECHNIQUE: Multidetector CT imaging of the chest was performed during intravenous contrast administration. Sagittal and coronal MPR images reconstructed from axial data set. CONTRAST:  72m OMNIPAQUE IOHEXOL 300 MG/ML  SOLN IV COMPARISON:  02/28/2019 FINDINGS: Cardiovascular: Vascular structures patent. No pericardial effusion. Heart normal size. Pulmonary arteries grossly unremarkable for phase of imaging. Mediastinum/Nodes: Base of cervical region normal appearance. Patient cachectic. No definite adenopathy. Lungs/Pleura: Multiple masses are identified throughout both lungs, many cavitary, consistent with septic emboli. Largest RIGHT lung lesion 2.7 x 2.7 cm RIGHT middle lobe image 123. Largest LEFT lung lesion in lingula 4.7 x 4.5 cm image 101. Dependent atelectasis in the posterior lungs  bilaterally. Interval decrease in size of significant LEFT pneumothorax component. Partially loculated LEFT pleural effusion with air-fluid level. Upper Abdomen: Small cyst upper LEFT kidney. Remaining visualized upper abdomen unremarkable. Musculoskeletal: No acute osseous lesions. IMPRESSION: Persistent loculated LEFT hydropneumothorax, with decrease in pneumothorax component since previous exam. Septic emboli throughout both lungs, little changed. Electronically Signed   By: MLavonia DanaM.D.   On: 03/08/2019 10:37   Ct Lumbar Spine W Contrast  Result Date: 03/20/2019 CLINICAL DATA:  Abdominal pain and fever. Abscess. At the drug abuse. Progressively worse back pain and bilateral lower extremity weakness. EXAM: CT LUMBAR SPINE WITH CONTRAST TECHNIQUE: Multidetector CT imaging of the lumbar spine was performed with intravenous contrast administration. CONTRAST:  1064mOMNIPAQUE IOHEXOL 300 MG/ML  SOLN COMPARISON:  MRI of the lumbar spine 03/14/2019. CT guided drain placement 03/15/2019 FINDINGS: Segmentation: 5 non rib-bearing lumbar type vertebral bodies are present. The lowest fully formed vertebral body is L5. Alignment: No significant listhesis is present. There is straightening of the normal lumbar lordosis. Rightward curvature is present at the thoracolumbar junction. Compensatory leftward curvature is present at L5-S1. Vertebrae: Lytic changes are noted at the endplates of L4I0-9ith some collapse on right. There is no progression collapse. Vertebral body heights are otherwise maintained. No other focal lytic or blastic lesions are present. Paraspinal and other soft tissues: Right paraspinal drain is in place. The largest collection collapsed. A more posterior and slightly superior peripherally enhancing collection measures 12 mm. No significant adenopathy is present. Atherosclerotic changes are noted in the aorta without aneurysm. Disc levels: L1-2: Negative. L2-3: No significant erosive changes are  present. Leftward disc protrusion is noted. L3-4: A leftward disc protrusion present. Mild facet hypertrophy is noted bilaterally. L4-5: A broad-based disc protrusion is associated with the infected disc. Moderate foraminal stenosis is again seen bilaterally. L5-S1: Mild disc bulging is present. Facet hypertrophy is worse on the right. IMPRESSION: 1. The right paraspinous scratched at the larger of the 2  right paraspinous fluid collections is drained. 2. The smaller, more superior posterior collection remains, measuring up to 12 mm. 3. Stable lytic endplate changes associated with disc osteomyelitis at L4-5. Collapse is worse on the right, stable from prior exam. 4. Subtle endplate changes posteriorly at L2-3 and at the right facet of L5-S1 without progression. 5.  Aortic Atherosclerosis (ICD10-I70.0). Electronically Signed   By: San Morelle M.D.   On: 03/20/2019 15:41   Mr Lumbar Spine W Wo Contrast  Result Date: 03/27/2019 CLINICAL DATA:  Back pain, infection. EXAM: MRI LUMBAR SPINE WITHOUT AND WITH CONTRAST TECHNIQUE: Multiplanar and multiecho pulse sequences of the lumbar spine were obtained without and with intravenous contrast. CONTRAST:  32m GADAVIST GADOBUTROL 1 MMOL/ML IV SOLN COMPARISON:  CT scan 03/20/2019 MRI from 03/14/2019 FINDINGS: Segmentation: The lowest lumbar type non-rib-bearing vertebra is labeled as L5. Alignment:  No vertebral subluxation is observed. Vertebrae: Again observed discitis osteomyelitis at the L4-5 level with endplate edema and sclerosis, and irregular rim enhancing collections along the anterior intervertebral disc space. There is bilateral facet edema and enhancement at this level, and septic arthritis of the facet joints is not excluded given the appearance. Endplate enhancement and type 1 degenerative endplate findings at LJ6-2 with subtle accentuated T2 signal in the L2-3 intervertebral disc, a second site of early discitis is likely. Stable 6 mm focus of T2  hyperintensity in the T12 vertebral body, probably a small hemangioma or similar benign lesion. Left eccentric type 1 degenerative endplate findings at TG31-51 Conus medullaris and cauda equina: Conus extends to the L1-2. Paraspinal and other soft tissues: Small abscesses are present along the right psoas muscle with surrounding myositis. This includes a 0.9 by 1.0 by 2.0 cm abscess on image 34/6 anteriorly, and a 0.9 cm posterior right psoas abscess on image 30/6. Paraspinal phlegmon is most prominent at the L4-5 level and surrounds the vertebral level, and also at the L2-3 level and adjacent levels up to T12. The previous enhancing epidural phlegmon at the L4-5 level is less striking than on the prior exam. There is abnormal enhancement extending in the L4-5 and to a lesser extent L5-S1 neural foramina bilaterally. I do not see a well-defined epidural abscess. Disc levels: T12-L1: Unremarkable. L1-2: No impingement. L2-3: Borderline left subarticular lateral recess stenosis due to left eccentric intervertebral spurring and facet spurring. L3-4: Mild left subarticular lateral recess stenosis and mild left foraminal stenosis due to left lateral recess and foraminal disc protrusion, disc bulge, and mild facet arthropathy. L4-5: At least mild bilateral foraminal stenosis due to facet and intervertebral spurring. L5-S1: Borderline bilateral foraminal stenosis due to disc bulge, intervertebral spurring, and facet arthropathy. IMPRESSION: 1. Active discitis-osteomyelitis at L4-5 and L2-3, with small right psoas muscle abscesses (reduced in size from prior exam) and considerable paraspinal phlegmon tracking between T12 L5. The amount of anterior epidural phlegmon at the L4-5 level is improved, but there is continued abnormal enhancement in both neural foramina at L4-5 and L5-S1. In addition, the facet joints at L4-5 demonstrate severe arthropathy and septic facet joints are not excluded. 2. Lumbar spondylosis and  degenerative disc disease with mild impingement at L3-4 and L4-5. Electronically Signed   By: WVan ClinesM.D.   On: 03/27/2019 18:48   Mr Lumbar Spine W Wo Contrast  Result Date: 03/14/2019 CLINICAL DATA:  Back pain, infection suspected. Patient with disseminated MSSA infection multiple abscesses. Worsening lower back pain not responding to pain management. Follow-up known osteomyelitis in spine. EXAM: MRI LUMBAR  SPINE WITHOUT AND WITH CONTRAST TECHNIQUE: Multiplanar and multiecho pulse sequences of the lumbar spine were obtained without and with intravenous contrast. CONTRAST:  58m GADAVIST GADOBUTROL 1 MMOL/ML IV SOLN COMPARISON:  Lumbar spine MRI 02/21/2019 FINDINGS: Segmentation: For the purposes of this dictation, five lumbar vertebrae are assumed and the caudal most well-formed intervertebral disc is designated L5-S1. Alignment: Straightening of the expected lumbar lordosis. No significant spondylolisthesis. Lumbar levocurvature. Vertebrae: Vertebral body height is maintained. Endplate irregularity at L4-L5 has increased as compared to prior examination. Again demonstrated there is extensive abnormal T2 hyperintense signal throughout the L4 and L5 vertebrae extending to the disc space with associated abnormal enhancement. Findings consistent with discitis/osteomyelitis. As before, there is fluid within the bilateral L4-L5 facet joints (greater on the right) and septic arthritis is difficult to exclude (series 11, image 29). More conspicuous than on prior examination, there is abnormal T2 hyperintensity and enhancement within the L2 and L3 vertebrae along the disc space. Findings highly suspicious for an additional level of discitis/osteomyelitis. Conus medullaris and cauda equina: Conus extends to the L2 level. No signal abnormality within the visualized distal spinal cord. New from prior MRI, there is ventral epidural phlegmon at the L4-L5 level measuring 0.6 cm in AP dimension (series 10, image  7). Paraspinal and other soft tissues: There is nonspecific soft tissue edema and enhancement within the lumbar paraspinal soft tissues, greatest at L4-L5 level. More conspicuous than on prior examination. There is abnormal paraspinal enhancing soft tissue consistent with phlegmon at the L4-L5 level extending to involve both psoas muscles. This extends into the bilateral L4-L5 neural foramina and partially into the bilateral L5-S1 neural foramina. Again demonstrated are bilateral psoas abscesses, the largest on the right measuring 2.7 x 2.4 x 4.4 cm (series 6, image 33) (series 9, image 13). Disc levels: T12-L1: No disc herniation. No significant canal or foraminal stenosis. L1-L2: No disc herniation. No significant canal or foraminal stenosis. L2-L3: Disc bulge with endplate spurring. New from prior examination there is a left center/subarticular caudally migrated disc extrusion. Mild facet arthrosis. The disc extrusion contributes to significant left subarticular stenosis with encroachment upon the descending left L3 nerve root. No significant central canal stenosis or neural foraminal narrowing. L3-L4: Disc bulge asymmetric to the left with associated endplate spurring. Mild facet arthrosis. Mild left subarticular and bilateral neural foraminal narrowing. No significant central canal stenosis. L4-L5: Disc bulge with endplate remodeling. Ventral epidural phlegmon as described. No significant spinal canal stenosis. As before, abnormal enhancement likely reflecting phlegmon extends into both neural foramina with severe bilateral neural foraminal narrowing. L5-S1: Small disc bulge. Mild facet arthrosis. No significant spinal canal stenosis. Abnormal soft tissue enhancement likely reflecting phlegmon extends into the bilateral neural foramina. IMPRESSION: 1. Redemonstrated findings of L4-L5 discitis/osteomyelitis. Endplate irregularity at this level has progressed. Persistent fluid within the bilateral L4-L5 facet  joints (greater on the right). For which septic arthritis is difficult to exclude. New ventral epidural phlegmon at this level measuring 0.6 cm in AP dimension without significant canal stenosis. Persistent extensive paraspinal phlegmon at this level extending into the bilateral L4-L5 neural foramina with resultant severe neural foraminal narrowing. Phlegmon also extends partially into the bilateral L5-S1 neural foramina. 2. Signal abnormality and abnormal enhancement now present at the L2-L3 level also suspicious for discitis/osteomyelitis. 3. Redemonstrated bilateral psoas abscesses, the largest on the right measuring 2.7 x 2.4 x 4.4 cm. 4. New L2-L3 left center/subarticular caudally migrated disc extrusion. Resultant left subarticular stenosis with encroachment upon the descending  left L3 nerve root. Electronically Signed   By: Kellie Simmering DO   On: 03/14/2019 13:07   Ct Hip Right Wo Contrast  Result Date: 03/31/2019 CLINICAL DATA:  Severe right hip pain for 5 days. No known injury. Suspected hip fracture. EXAM: CT OF THE RIGHT HIP WITHOUT CONTRAST TECHNIQUE: Multidetector CT imaging of the right hip was performed according to the standard protocol. Multiplanar CT image reconstructions were also generated. COMPARISON:  Pelvic CT 06/14/2006. FINDINGS: Bones/Joint/Cartilage Examination is targeted to the right hip and inferior right hemipelvis. There is no evidence of acute fracture, dislocation or femoral head avascular necrosis. The bones appear adequately mineralized. There is no significant right hip arthropathy or joint effusion. Ligaments Suboptimally assessed by CT. Muscles and Tendons No focal muscular abnormalities are identified. Soft tissues There is mild generalized soft tissue edema without focal fluid collection, foreign body or soft tissue emphysema. The visualized internal pelvic contents appear unremarkable. IMPRESSION: 1. No evidence of acute right hip fracture or dislocation. 2. Mild  generalized soft tissue edema. Electronically Signed   By: Richardean Sale M.D.   On: 03/31/2019 15:16   Dg Chest Port 1 View  Result Date: 03/29/2019 CLINICAL DATA:  Fever, heroin abuse EXAM: PORTABLE CHEST 1 VIEW COMPARISON:  03/16/2019 FINDINGS: Multiple bilateral pulmonary nodules which have improved compared with the prior examination of 03/16/2019. No new focal consolidation. No pleural effusion. Small residual left apical pneumothorax. Stable cardiomediastinal silhouette. No aggressive osseous lesion. IMPRESSION: 1. Bilateral pulmonary nodules which are improved compared with the prior examination of 03/16/2019. Small residual left apical pneumothorax. Electronically Signed   By: Kathreen Devoid   On: 03/29/2019 15:32   Dg Chest Port 1 View  Result Date: 03/15/2019 CLINICAL DATA:  Hydropneumothorax. EXAM: PORTABLE CHEST 1 VIEW COMPARISON:  March 14, 2019. FINDINGS: The heart size and mediastinal contours are within normal limits. Stable mild left apical pneumothorax is noted. Stable bilateral nodular densities are noted some of which are cavitary, consistent with septic emboli. The visualized skeletal structures are unremarkable. IMPRESSION: Stable mild left apical pneumothorax is noted. Stable bilateral nodular densities are noted, some of which are cavitary, consistent with septic emboli. Electronically Signed   By: Marijo Conception M.D.   On: 03/15/2019 13:03   Dg Chest Port 1 View  Result Date: 03/14/2019 CLINICAL DATA:  Chest tube removal EXAM: PORTABLE CHEST 1 VIEW COMPARISON:  03/14/2019, 03/13/2019, 03/10/2019 FINDINGS: Interim removal of left-sided chest tube. Overall no significant interval change in size of small residual left apicolateral pneumothorax. There is probable trace pneumothorax at the left CP angle. Stable cardiomediastinal silhouette. Multiple bilateral nodules and cavitary lesions as before. IMPRESSION: 1. Removal of left chest tube. 2. Overall no significant interval  change in size of small residual left apicolateral pneumothorax. Now seen is a small amount of pneumothorax component at the left CP angle. 3. Stable bilateral lung nodules and cavitary lesions. Electronically Signed   By: Donavan Foil M.D.   On: 03/14/2019 16:55   Dg Chest Port 1 View  Result Date: 03/14/2019 CLINICAL DATA:  Hydropneumothorax EXAM: PORTABLE CHEST 1 VIEW COMPARISON:  03/13/2019 FINDINGS: No significant change in AP portable examination with a persistent small left lateral and apical pneumothorax component and a left-sided pigtail chest tube in position about the lateral lung base. Probable small persistent effusion component. No change in multiple masses and nodules bilaterally, some of which are cavitary. The heart and mediastinum are unremarkable. IMPRESSION: 1. No significant change in AP portable examination  with a persistent small left lateral and apical pneumothorax component and a left-sided pigtail chest tube in position about the lateral lung base. Probable small persistent effusion component. 2. No change in multiple masses and nodules bilaterally, some of which are cavitary. Electronically Signed   By: Eddie Candle M.D.   On: 03/14/2019 09:58   Dg Chest Port 1 View  Result Date: 03/13/2019 CLINICAL DATA:  48 year male with shortness of breath and left-sided pneumothorax. History of septic emboli. EXAM: PORTABLE CHEST 1 VIEW COMPARISON:  Chest radiograph dated 03/10/2019. FINDINGS: Left-sided chest tube in similar position. Overall slight interval increase in the size of the left pneumothorax since the radiograph of 03/10/2019. Continued follow-up recommended. No mediastinal shift. Bilateral nodular and confluent airspace densities similar to prior radiograph. There is a small left pleural effusion. Stable cardiac silhouette. No acute osseous pathology. IMPRESSION: 1. Slight interval increase in the size of the left pneumothorax since the radiograph of 03/10/19.  Continued follow-up recommended. 2. Bilateral nodular and confluent airspace densities similar to prior radiograph. Electronically Signed   By: Anner Crete M.D.   On: 03/13/2019 12:26   Dg Chest Port 1 View  Result Date: 03/07/2019 CLINICAL DATA:  Left chest tube in place for pneumothorax. EXAM: PORTABLE CHEST 1 VIEW COMPARISON:  Single-view of the chest 03/04/2019 03/03/2019. CT chest 02/28/2019. History of septic emboli. FINDINGS: Pigtail catheter remains in place in the lower left chest. Left pneumothorax seen on the most recent examination has slightly increased in size. Multiple cavitary nodules are again seen bilaterally. No right pneumothorax. Heart size is normal. IMPRESSION: Small left pneumothorax seen on the most recent examination has mildly increased in size. Left chest tube remains in place. Multiple cavitary nodules bilaterally consistent with septic emboli. Electronically Signed   By: Inge Rise M.D.   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 Patient presents today for CT-guided aspiration and/or drainage catheter placement. No, patient previously underwent CT-guided paraspinal aspiration on 02/22/2019. EXAM: CT IMAGE GUIDED DRAINAGE BY PERCUTANEOUS CATHETER COMPARISON:  Lumbar spine MRI-03/14/2019; CT-guided paraspinal abscess aspiration-02/22/2019 MEDICATIONS: The patient is currently admitted to the hospital and receiving intravenous antibiotics. The antibiotics were administered within an appropriate time frame prior to the initiation of the procedure. ANESTHESIA/SEDATION: Moderate (conscious) sedation was employed during this procedure. A total of Benadryl 25 mg IV; versed 3 mg and Fentanyl 100 mcg was administered intravenously. Moderate Sedation Time: 14 minutes. The patient's level of consciousness and vital signs were  monitored continuously by radiology nursing throughout the procedure under my direct supervision. CONTRAST:  None COMPLICATIONS: None immediate. PROCEDURE: Informed written consent was obtained from the patient after a discussion of the risks, benefits and alternatives to treatment. The patient was placed prone on the CT gantry and a pre procedural CT was performed re-demonstrating the known abscess/fluid collection within the inferior aspect the right iliopsoas musculature with dominant component measuring approximately 2.6 x 2.4 cm (25, series 2) the procedure was planned. A timeout was performed prior to the initiation of the procedure. The skin overlying the posterior inferior back was prepped and draped in the usual sterile fashion. The overlying soft tissues were anesthetized with 1% lidocaine with epinephrine. Appropriate trajectory was planned with the use of a 22 gauge spinal needle. An 18 gauge trocar needle was advanced into the abscess/fluid collection and a short Amplatz super stiff wire was coiled  within the collection. Appropriate positioning was confirmed with a limited CT scan. The tract was serially dilated allowing placement of a 10 [place] all-purpose drainage catheter. Appropriate positioning was confirmed with a limited postprocedural CT scan. Approximately 15 ml of purulent fluid was aspirated. The tube was connected to a JP bulb and sutured in place. A dressing was placed. The patient tolerated the procedure well without immediate post procedural complication. IMPRESSION: Successful CT guided placement of a 10 French all purpose drain catheter into the recurrent right paraspinal abscess with aspiration of 15 mL of purulent fluid. Samples were sent to the laboratory as requested by the ordering clinical team. PLAN: - Flush the percutaneous drainage catheter with 10 cc of normal saline b.i.d. - Record daily output from the percutaneous drainage catheter. - once drainage catheter output is less  than 10 cc per day (excluding flush), repeat contrast-enhanced CT scan of the abdomen and pelvis is recommended to ensure abscess resolution. Ultimately, the percutaneous drainage catheter may be removed once the abscess has resolved on CT imaging, not requiring dedicated percutaneous drainage catheter injection given lack clinical concern for an enteric fistula. Electronically Signed   By: Sandi Mariscal M.D.   On: 03/15/2019 17:06   Vas [place] Upper Extremity Venous Duplex  Result Date: 03/07/2019 UPPER VENOUS STUDY  Indications: Pain Comparison Study: no prior Performing Technologist: Abram Sander RVS  Examination Guidelines: A complete evaluation includes B-mode imaging, spectral Doppler, color Doppler, and power Doppler as needed of all accessible portions of each vessel. Bilateral testing is considered an integral part of a complete examination. Limited examinations for reoccurring indications may be performed as noted.  Right Findings: +----------+------------+---------+-----------+----------+-------+  RIGHT      Compressible Phasicity Spontaneous Properties Summary  +----------+------------+---------+-----------+----------+-------+  Subclavian                 Yes        Yes                         +----------+------------+---------+-----------+----------+-------+  Left Findings: +----------+------------+---------+-----------+----------+-------+  LEFT       Compressible Phasicity Spontaneous Properties Summary  +----------+------------+---------+-----------+----------+-------+  IJV            Full        Yes        Yes                         +----------+------------+---------+-----------+----------+-------+  Subclavian     Full        Yes        Yes                         +----------+------------+---------+-----------+----------+-------+  Axillary       Full        Yes        Yes                         +----------+------------+---------+-----------+----------+-------+  Brachial       Full        Yes         Yes                         +----------+------------+---------+-----------+----------+-------+  Radial         Full                                               +----------+------------+---------+-----------+----------+-------+  Ulnar          Full                                               +----------+------------+---------+-----------+----------+-------+  Cephalic       Full                                               +----------+------------+---------+-----------+----------+-------+  Basilic        Full                                               +----------+------------+---------+-----------+----------+-------+  Summary:  Right: No evidence of thrombosis in the subclavian.  Left: No evidence of deep vein thrombosis in the upper extremity. No evidence of superficial vein thrombosis in the upper extremity. No evidence of thrombosis in the subclavian.  *See table(s) above for measurements and observations.  Diagnosing physician: Harold Barban MD Electronically signed by Harold Barban MD on 03/07/2019 at 5:52:52 PM.    Final     Lab Data:  CBC: Recent Labs  Lab 04/03/19 0855  WBC 7.0  HGB 10.2*  HCT 33.4*  MCV 96.0  PLT 672   Basic Metabolic Panel: Recent Labs  Lab 03/31/19 1020 04/03/19 0855  NA 137 138  K 3.7 4.1  CL 100 98  CO2 25 29  GLUCOSE 166* 96  BUN 18 14  CREATININE 0.51* 0.39*  CALCIUM 8.5* 9.5   GFR: Estimated Creatinine Clearance: 78.8 mL/min (A) (by C-G formula based on SCr of 0.39 mg/dL (L)). Liver Function Tests: No results for input(s): AST, ALT, ALKPHOS, BILITOT, PROT, ALBUMIN in the last 168 hours. No results for input(s): LIPASE, AMYLASE in the last 168 hours. No results for input(s): AMMONIA in the last 168 hours. Coagulation Profile: No results for input(s): INR, PROTIME in the last 168 hours. Cardiac Enzymes: No results for input(s): CKTOTAL, CKMB, CKMBINDEX, TROPONINI in the last 168 hours. BNP (last 3 results) No results for input(s): PROBNP in  the last 8760 hours. HbA1C: No results for input(s): HGBA1C in the last 72 hours. CBG: Recent Labs  Lab 03/31/19 0748 03/31/19 1131 03/31/19 1622 03/31/19 2233 04/01/19 0737  GLUCAP 96 109* 100* 131* 91   Lipid Profile: No results for input(s): CHOL, HDL, LDLCALC, TRIG, CHOLHDL, LDLDIRECT in the last 72 hours. Thyroid Function Tests: No results for input(s): TSH, T4TOTAL, FREET4, T3FREE, THYROIDAB in the last 72 hours. Anemia Panel: No results for input(s): VITAMINB12, FOLATE, FERRITIN, TIBC, IRON, RETICCTPCT in the last 72 hours. Urine analysis:    Component Value Date/Time   BILIRUBINUR neg 12/21/2017 1551   PROTEINUR Negative 12/21/2017 1551   UROBILINOGEN 0.2 12/21/2017 1551   NITRITE neg 12/21/2017 1551   LEUKOCYTESUR Negative 12/21/2017 1551     Author Hat[place]ad M.D. [facility] 04/03/2019, 1:21 PM   Call night coverage person covering after 7pm

## 2019-04-03 NOTE — Progress Notes (Signed)
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  General Gait Details: heavy use (lean) on EVA walker with decreased WBing tolerance R LE with hip and knee buckle with stance phase.  Pain "radiating" with activity.  Tolerated an increased distance but with much effort.  Unable to attempt a regular walker at this time.  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  Required Min Assist to support R LE back into bed due to inability and increased pain.  Transfers Overall transfer level: Needs assistance Equipment used: Bilateral platform walker Transfers: Sit to/from Stand Sit to Stand: Min assist;+2 safety/equipment;From elevated surface Stand pivot transfers: Min assist;+2 safety/equipment       General transfer comment: heavy use on B Platform walker and increased time to transfer hands from walker to bed  Ambulation/Gait Ambulation/Gait assistance: Min assist;Mod assist;+2 physical assistance;+2 safety/equipment Gait Distance  (Feet): 55 Feet Assistive device: Bilateral platform walker Gait Pattern/deviations: Step-to pattern;Decreased step length - right;Decreased step length - left Gait velocity: decreased   General Gait Details: heavy use (lean) on EVA walker with decreased WBing tolerance R LE with hip and knee buckle with stance phase.  Pain "radiating" with activity.  Tolerated an increased distance but with much effort.  Unable to attempt a regular walker at this time.   Stairs             Wheelchair Mobility    Modified Rankin (Stroke Patients Only)       Balance                                            Cognition Arousal/Alertness: Awake/alert Behavior During Therapy: WFL for tasks assessed/performed Overall Cognitive Status: Within Functional Limits for tasks assessed                                 General Comments: pt agreeable to therpay today, pt complaining of pain throughout but able to complete session      Exercises      General Comments        Pertinent Vitals/Pain Faces Pain Scale: Hurts whole lot Pain Location: R hip Pain Descriptors / Indicators: Guarding;Grimacing;Moaning;Sharp Pain Intervention(s): Monitored during session;Premedicated before session;Repositioned    Home Living                      Prior Function  PT Goals (current goals can now be found in the care plan section) Progress towards PT goals: Progressing toward goals    Frequency    Min 2X/week      PT Plan Current plan remains appropriate    Co-evaluation              AM-PAC PT "6 Clicks" Mobility   Outcome Measure  Help needed turning from your back to your side while in a flat bed without using bedrails?: A Lot Help needed moving from lying on your back to sitting on the side of a flat bed without using bedrails?: A Lot Help needed moving to and from a bed to a chair (including a wheelchair)?: A Lot Help needed  standing up from a chair using your arms (e.g., wheelchair or bedside chair)?: A Lot Help needed to walk in hospital room?: A Lot Help needed climbing 3-5 steps with a railing? : Total 6 Click Score: 11    End of Session Equipment Utilized During Treatment: Gait belt Activity Tolerance: Patient limited by pain Patient left: with call bell/phone within reach;in bed;with nursing/sitter in room Nurse Communication: Mobility status PT Visit Diagnosis: Muscle weakness (generalized) (M62.81);Difficulty in walking, not elsewhere classified (R26.2)     Time: 1455-1520 PT Time Calculation (min) (ACUTE ONLY): 25 min  Charges:  $Gait Training: 8-22 mins $Therapeutic Activity: 8-22 mins                     Felecia Shelling  PTA Acute  Rehabilitation Services Pager      [phone] Office      [phone]

## 2019-04-03 NOTE — Progress Notes (Signed)
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 Pt states he watches the clock in regards when he can have more.  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  Required Min Assist to support R LE back into bed due to inability and increased pain.  Transfers Overall transfer level: Needs assistance Equipment used: Rolling walker (2 wheeled) Transfers: Sit to/from Stand Sit to Stand: Min assist;From elevated surface Stand pivot transfers: Min assist;+2 safety/equipment       General transfer comment: heavy use on B Platform walker and increased time to transfer hands from walker to bed    Balance Overall balance assessment: Needs assistance Sitting-balance support: Bilateral upper extremity supported;Feet supported Sitting balance-Leahy Scale: Fair     Standing balance support: Bilateral upper extremity supported;During functional  activity Standing balance-Leahy Scale: Fair                             ADL either performed or assessed with clinical judgement   ADL Overall ADL's : Needs assistance/impaired                             Toileting- Clothing Manipulation and Hygiene: Moderate assistance;Cueing for safety;Cueing for sequencing;Cueing for back precautions;Sit to/from stand;Cueing for compensatory techniques Toileting - Clothing Manipulation Details (indicate cue type and reason): pt performed 1 sit to stand with OT.  Pt did scream out several times and state he would up all night due to the pain.  OT explained moving around was beneficial. Pt very resistant and rude at times and want pain medication.             Vision Patient Visual Report: No change from baseline            Cognition Arousal/Alertness: Awake/alert Behavior During Therapy: WFL for tasks assessed/performed;Agitated Overall Cognitive Status: Within Functional Limits for tasks assessed                                 General Comments: pt agreeable to therpay today, pt complaining of pain throughout but able to complete session                   Pertinent Vitals/ Pain       Pain Assessment: 0-10 Faces Pain Scale: Hurts worst Pain Location: R hip Pain Descriptors / Indicators: Guarding;Grimacing;Moaning;Sharp Pain Intervention(s): Monitored during session;Premedicated before session;Repositioned  Frequency  Min 2X/week        Progress Toward Goals  OT Goals(current goals can now be found in the care plan section)  Progress towards OT goals: OT to reassess next treatment     Plan Discharge plan remains appropriate    Co-evaluation                 AM-PAC OT "6 Clicks" Daily Activity     Outcome Measure   Help from another person eating meals?: None Help from another person taking care of personal grooming?: A Little Help from another person toileting,  which includes using toliet, bedpan, or urinal?: A Lot Help from another person bathing (including washing, rinsing, drying)?: A Lot Help from another person to put on and taking off regular upper body clothing?: A Little Help from another person to put on and taking off regular lower body clothing?: A Lot 6 Click Score: 16    End of Session    OT Visit Diagnosis: Unsteadiness on feet (R26.81);Muscle weakness (generalized) (M62.81)   Activity Tolerance Patient limited by pain   Patient Left in bed;with call bell/phone within reach   Nurse Communication          Time: 1007-1219 OT Time Calculation (min): 12 min  Charges: OT General Charges $OT Visit: 1 Visit OT Treatments $Self Care/Home Management : 8-22 mins  Lise Auer, OT Acute Rehabilitation Services [phone] Office- [phone]      Gaylen Venning, Karin Golden D 04/03/2019, 6:39 PM

## 2019-04-04 MED ORDER — CYCLOBENZAPRINE HCL 10 MG PO TABS
10.0000 mg | ORAL_TABLET | Freq: Three times a day (TID) | ORAL | Status: DC
  Administered 2019-04-04 – 2019-04-14 (×32): 10 mg via ORAL
  Filled 2019-04-04 (×32): qty 1

## 2019-04-04 MED ORDER — ACETAMINOPHEN 500 MG PO TABS
1000.0000 mg | ORAL_TABLET | Freq: Three times a day (TID) | ORAL | Status: AC
  Administered 2019-04-04 – 2019-04-06 (×9): 1000 mg via ORAL
  Filled 2019-04-04 (×9): qty 2

## 2019-04-04 MED ORDER — FENTANYL 50 MCG/HR TD PT72
1.0000 | MEDICATED_PATCH | TRANSDERMAL | Status: DC
  Administered 2019-04-04 – 2019-04-13 (×4): 1 via TRANSDERMAL
  Filled 2019-04-04 (×4): qty 1

## 2019-04-04 MED ORDER — KETOROLAC TROMETHAMINE 15 MG/ML IJ SOLN
15.0000 mg | Freq: Four times a day (QID) | INTRAMUSCULAR | Status: AC
  Administered 2019-04-04 – 2019-04-09 (×20): 15 mg via INTRAVENOUS
  Filled 2019-04-04 (×20): qty 1

## 2019-04-04 MED ORDER — OXYCODONE HCL 5 MG PO TABS
5.0000 mg | ORAL_TABLET | Freq: Four times a day (QID) | ORAL | Status: DC | PRN
  Administered 2019-04-04 – 2019-04-13 (×28): 10 mg via ORAL
  Administered 2019-04-13: 21:00:00 5 mg via ORAL
  Administered 2019-04-14: 04:00:00 10 mg via ORAL
  Administered 2019-04-14: 5 mg via ORAL
  Administered 2019-04-14: 16:00:00 10 mg via ORAL
  Filled 2019-04-04 (×33): qty 2

## 2019-04-04 NOTE — Progress Notes (Signed)
Physical Therapy Treatment Patient Details Name: Franklin Anderson MRN: [id number] DOB: 1970/12/22 Today's Date: 04/04/2019    History of Present Illness Sepsis - MSSA bacteremia with L4/L5 osteomyelitis and discitis, epidural abscess, psoas abscess, septic pulmonary emboli, empyema and right ventricular mural vegetation    PT Comments    Pt demonstrated great motivation today. Pt pleasant during treatment. General bed mobility comments: Pt was able to sit up on EOB with increased time and use of bed rails. Pt was able to lift B LE up back into bed on his own. General transfer comment: Pt required heavy use on B Platform walker. Pt seemed to do better today with a slight decrease in pain General Gait Details: Pt required heavy use of B platform walker. Heard audiable pop from R knee. Pt states it does that and is very painful. Pt had decreased complaints of pain today. Pt was able to go twice the distance he did yesterday and stated it was due to a slight increase in his pain meds. Pt states he understands the importance of getting up and walking. Pt states he would like to try the regular walker tomorrow.   Follow Up Recommendations  SNF     Equipment Recommendations  Other (comment)    Recommendations for Other Services       Precautions / Restrictions Precautions Precautions: Fall Precaution Comments: Sepsis - MSSA bacteremia with L4/L5 osteomyelitis and discitis, epidural abscess, psoas abscess, septic pulmonary emboli, empyema and right ventricular mural vegetation Restrictions Weight Bearing Restrictions: No Other Position/Activity Restrictions: WBAT    Mobility  Bed Mobility Overal bed mobility: Modified Independent Bed Mobility: Supine to Sit;Sit to Supine     Supine to sit: Min guard Sit to supine: Min assist   General bed mobility comments: Pt was able to sit up on EOB with increased time and use of bed rails. Pt was able to lift B LE up back into bed on his  own.  Transfers Overall transfer level: Needs assistance Equipment used: Rolling walker (2 wheeled) Transfers: Sit to/from Stand Sit to Stand: From elevated surface;Min guard         General transfer comment: Pt required heavy use on B Platform walker. Pt seemed to do better today with a slight decrease in pain  Ambulation/Gait Ambulation/Gait assistance: Min guard Gait Distance (Feet): 100 Feet Assistive device: Bilateral platform walker Gait Pattern/deviations: Step-to pattern;Decreased step length - right;Decreased step length - left Gait velocity: decreased   General Gait Details: Pt required heavy use of B platform walker. Heard audiable pop from R knee. Pt states it does that and is very painful. Pt had decreased complaints of pain today. Pt was able to go twice the distance he did yesterday and stated it was due to a slight increase in his pain meds.   Stairs             Wheelchair Mobility    Modified Rankin (Stroke Patients Only)       Balance                                            Cognition Arousal/Alertness: Awake/alert Behavior During Therapy: WFL for tasks assessed/performed;Agitated Overall Cognitive Status: Within Functional Limits for tasks assessed  Exercises      General Comments        Pertinent Vitals/Pain Pain Assessment: 0-10 Pain Score: 8  Pain Location: R hip Pain Descriptors / Indicators: Guarding;Grimacing;Sharp;Shooting Pain Intervention(s): Monitored during session;Repositioned;Premedicated before session    Home Living                      Prior Function            PT Goals (current goals can now be found in the care plan section) Progress towards PT goals: Progressing toward goals    Frequency    Min 2X/week      PT Plan Current plan remains appropriate    Co-evaluation              AM-PAC PT "6 Clicks" Mobility    Outcome Measure  Help needed turning from your back to your side while in a flat bed without using bedrails?: A Lot Help needed moving from lying on your back to sitting on the side of a flat bed without using bedrails?: A Lot Help needed moving to and from a bed to a chair (including a wheelchair)?: A Lot Help needed standing up from a chair using your arms (e.g., wheelchair or bedside chair)?: A Lot Help needed to walk in hospital room?: A Lot Help needed climbing 3-5 steps with a railing? : Total 6 Click Score: 11    End of Session Equipment Utilized During Treatment: Gait belt Activity Tolerance: Patient tolerated treatment well Patient left: in bed;with call bell/phone within reach Nurse Communication: Mobility status PT Visit Diagnosis: Muscle weakness (generalized) (M62.81);Difficulty in walking, not elsewhere classified (R26.2)     Time: 1751-0258 PT Time Calculation (min) (ACUTE ONLY): 27 min  Charges:  $Gait Training: 8-22 mins $Therapeutic Activity: 8-22 mins                     Marti Sleigh, SPTA [facility]

## 2019-04-04 NOTE — Progress Notes (Signed)
[facility]                                                                              Patient Demographics  Franklin Anderson, is a 48 y.o. male, DOB - 01/28/1971, PHX:[id number]  Admit date - 02/21/2019   Admitting Physician Etta Quill, DO  Outpatient Primary MD for the patient is Mack Hook, MD  Outpatient specialists:   LOS - 41  days   Medical records reviewed and are as summarized below:    Chief Complaint  Patient presents with   Back Pain   Heroin Detox   Weight Loss       Brief summary   Franklin Anderson 48 y.o.malewith medical history significant of rheumatic fever, HTN, ongoingIV heroin abuse, nicotine abuse, presented to the ED on 10/27 with 3 week course of progressively worsening back pain and BLE weakness associated with some bowel incontinence and trouble walking. Last use of heroin was on the day of admission. In ED > Found to have sepsis with Tm 100, HR 126, RR 31, WBC 22.4k,AKI with BUN 111 and creat 1.7. Imaging studies revealed osteomyelitis and diskitis of L4-L5,septic pulmonary emboli and psoas abscesses. He was started on IV antibiotics and admitted to [facility].  On 10/28 was found to be obtunded and dyspneic and was intubated. Blood cultures from admission grew out MSSA. Subsequently found to have RV vegetation,  left sided empyema (MSSA) and  a left pneumothorax for which he received a chest tube.  He also received a drain in his psoas abscess.  Both chest tube and drain have been removed and ID has signed off with instructions to continue IV antibiotics until 12/16. In the meantime he has not been able to participate with physical therapy due to ongoing complaints of pain.  He is on a number of different narcotics which apparently are not strong enough to control his current pain which appears to move from day-to-day.    Assessment & Plan   Principal problem Sepsis with MSSA bacteremia, L4-L5 osteomyelitis and discitis,  epidural abscess, psoas abscess, septic pulmonary emboli, empyema and right ventricular mural vegetation -Right psoas abscess was aspirated on 10/28, culture positive for MSSA. -Respiratory culture 10/28 also MSSA -2D echo 10/28 showed possible mitral valve vegetation and moderate pericardial effusion -Underwent TEE on 11/3 showed medium sized, mobile mass attached to the trabeculation in the RV and moderate sized pericardial effusion -Per CT surgery, not a good candidate for thoracotomy -MRI on 11/17 showed increasing psoas abscess s/p catheter drainage of right paraspinal abscess by IR on 11/18 -Catheter removed on 11/27 -ID recommended continue IV Ancef through 12/16 followed by Keflex for 30 days -Patient had continued to complain of pain in his back, hip, MRI repeat was obtained on 11/30 showed improvement in the abscess in size of phlegmon with ongoing discitis and osteomyelitis -CT of the right hip showed no evidence of acute right hip fracture or dislocation, mild generalized soft tissue edema, no acute abnormality -Continue IV Ancef -Complaining of pain, reports that he ambulated in the hallway yesterday and since then his pain is flared up and is 10 out  of 10.  Placed on Toradol 15 mg every 6 hours, increased fentanyl patch to 50 MCG every 72 hours, increase Flexeril to 10 mg 3 times daily.  Continue oral Dilaudid as needed and oral oxycodone prn for breakthrough pain.  No further increase in the narcotics until patient is reassessed.  Monitor for narcotic seeking behavior.  Active problems Acute respiratory failure with hypoxemia secondary to MSSA infection, septic pulmonary emboli, left empyema, left bronchopleural fistula (hydropneumothorax) -Patient was intubated on 10/28-10/31 -Underwent bronchoscopy with BAL, s/p left chest tube, removed on 11/17 -Currently on room air, no acute issues -Chest x-ray 12/2 showed bilateral pulmonary nodules, which are improved compared to prior exam  on 11/19.  Small residual left apical pneumothorax -Avoid increasing narcotics due to risk of respiratory depression, monitor for narcotic seeking behavior.   Moderate pericardial effusion -Seen on 2D echo and TEE, treated conservatively and improving -Last echo on 11/9 showed mild effusion  Essential hypertension -BP stable, continue Norvasc, metoprolol  Acute metabolic encephalopathy Due to #1 #2, resolved, alert and oriented x3  Severe protein calorie malnutrition, BMI 14.9 -Continue dietary supplements  HCV positive Per ID, seems to have cleared   Pressure injury Sacrum with stage I, Right hip deep tissue injury Wound care per nursing   Code Status: Full code DVT Prophylaxis:  Lovenox  Family Communication: Discussed all imaging results, lab results, explained to the patient    Disposition Plan: Remains inpatient till completes IV antibiotics, completes on 12/16  Time Spent in minutes 25 minutes  Procedures:  2D echo TEE Intubation He  Consultants:   PCCM, cardiology, ID, palliative care, cardiothoracic surgery  Antimicrobials:   Anti-infectives (From admission, onward)   Start     Dose/Rate Route Frequency Ordered Stop   02/24/19 2200  vancomycin (VANCOCIN) IVPB 1000 mg/200 mL premix  Status:  Discontinued     1,000 mg 200 mL/hr over 60 Minutes Intravenous Every 12 hours 02/24/19 1010 02/24/19 1253   02/24/19 1600  ceFAZolin (ANCEF) IVPB 2g/100 mL premix     2 g 200 mL/hr over 30 Minutes Intravenous Every 8 hours 02/24/19 1253 04/12/19 2359   02/23/19 2200  vancomycin (VANCOCIN) IVPB 750 mg/150 ml premix  Status:  Discontinued     750 mg 150 mL/hr over 60 Minutes Intravenous Every 24 hours 02/23/19 0853 02/23/19 0909   02/23/19 2200  vancomycin (VANCOCIN) IVPB 750 mg/150 ml premix  Status:  Discontinued     750 mg 150 mL/hr over 60 Minutes Intravenous Every 12 hours 02/23/19 0909 02/24/19 1010   02/22/19 2200  vancomycin (VANCOCIN) IVPB 750 mg/150  ml premix  Status:  Discontinued     750 mg 150 mL/hr over 60 Minutes Intravenous Every 24 hours 02/22/19 0826 02/22/19 0827   02/22/19 2200  vancomycin (VANCOCIN) IVPB 1000 mg/200 mL premix  Status:  Discontinued     1,000 mg 200 mL/hr over 60 Minutes Intravenous Every 24 hours 02/22/19 0827 02/23/19 0853   02/22/19 0900  ceFEPIme (MAXIPIME) 2 g in sodium chloride 0.9 % 100 mL IVPB  Status:  Discontinued     2 g 200 mL/hr over 30 Minutes Intravenous Every 12 hours 02/22/19 0826 02/23/19 1135   02/21/19 2015  ceFEPIme (MAXIPIME) 2 g in sodium chloride 0.9 % 100 mL IVPB     2 g 200 mL/hr over 30 Minutes Intravenous  Once 02/21/19 2013 02/22/19 0031   02/21/19 2015  metroNIDAZOLE (FLAGYL) IVPB 500 mg     500 mg 100 mL/hr  over 60 Minutes Intravenous  Once 02/21/19 2013 02/22/19 0031   02/21/19 2015  vancomycin (VANCOCIN) IVPB 1000 mg/200 mL premix     1,000 mg 200 mL/hr over 60 Minutes Intravenous  Once 02/21/19 2013 02/22/19 0200         Medications  Scheduled Meds:  acetaminophen  1,000 mg Oral TID   amLODipine  5 mg Oral Daily   Chlorhexidine Gluconate Cloth  6 each Topical Daily   cyclobenzaprine  10 mg Oral TID   enoxaparin (LOVENOX) injection  40 mg Subcutaneous Q24H   feeding supplement (ENSURE ENLIVE)  237 mL Oral BID BM   fentaNYL  1 patch Transdermal Q72H   ketorolac  15 mg Intravenous Q6H   lidocaine  1 patch Transdermal Q24H   mouth rinse  15 mL Mouth Rinse BID   metoprolol tartrate  100 mg Oral BID   multivitamin with minerals  1 tablet Oral Daily   pantoprazole  40 mg Oral Daily   polyethylene glycol  17 g Oral Daily   Continuous Infusions:  sodium chloride 1,000 mL (04/03/19 2141)    ceFAZolin (ANCEF) IV 2 g (04/04/19 0602)   PRN Meds:.sodium chloride, acetaminophen **OR** acetaminophen, clonazePAM, hydrALAZINE, HYDROmorphone, ipratropium-albuterol, ondansetron **OR** ondansetron (ZOFRAN) IV, oxyCODONE, senna-docusate      Subjective:    Franklin Anderson was seen and examined today.  Complaining of pain in the right hip, leg, states ambulated in the hallway yesterday x2 and since then pain is flared up and did not sleep well last night.  Moaning with pain.   Patient denies dizziness, chest pain, shortness of breath, abdominal pain, N/V.  No fevers.  Objective:   Vitals:   04/03/19 2241 04/03/19 2358 04/04/19 0554 04/04/19 0910  BP: (!) 144/98 124/86 (!) 138/96 (!) 150/89  Pulse: 99 97 92 100  Resp: 17  17   Temp: 98.3 F (36.8 C)  97.8 F (36.6 C)   TempSrc: Oral  Oral   SpO2: 97%  97% 98%  Weight:      Height:        Intake/Output Summary (Last 24 hours) at 04/04/2019 1443 Last data filed at 04/04/2019 0952 Gross per 24 hour  Intake --  Output 1750 ml  Net -1750 ml     Wt Readings from Last 3 Encounters:  03/15/19 49.9 kg  12/21/17 65.3 kg  04/22/16 68 kg    Physical Exam  General: Alert and oriented x 3, NAD, moaning with pain  Eyes:   HEENT:  Atraumatic, normocephalic  Cardiovascular: S1 S2 clear, RRR. No pedal edema b/l  Respiratory: CTAB, no wheezing, rales or rhonchi  Gastrointestinal: Soft, nontender, nondistended, NBS  Ext: no pedal edema bilaterally  Neuro: no new deficits  Musculoskeletal: No cyanosis, clubbing  Skin: No rashes  Psych: Normal affect and demeanor, alert and oriented x3      Data Reviewed:  I have personally reviewed following labs and imaging studies  Micro Results No results found for this or any previous visit (from the past 240 hour(s)).  Radiology Reports Dg Chest 1 View  Result Date: 03/16/2019 CLINICAL DATA:  Pleural effusion. EXAM: CHEST  1 VIEW COMPARISON:  March 15, 2019. FINDINGS: The heart size and mediastinal contours are within normal limits. Stable mild left apical and basilar pneumothorax is noted. Stable bilateral nodular densities are noted, some of which are cavitary, consistent with septic emboli. The visualized skeletal structures are  unremarkable. IMPRESSION: Stable mild left pneumothorax. Stable bilateral nodular densities are again  noted consistent with septic emboli. Electronically Signed   By: Marijo Conception M.D.   On: 03/16/2019 13:49   Dg Chest 2 View  Result Date: 03/10/2019 CLINICAL DATA:  Chest tube, left pleural effusion EXAM: CHEST - 2 VIEW COMPARISON:  03/08/2019 FINDINGS: Left-sided pigtail chest tube remains in position about the left lung base, with a small, persistent left apical pneumothorax, not significantly changed compared to prior examination. Small, persistent pleural effusion component about the left lung base. Multiple pulmonary nodules are again noted bilaterally, some of which are cavitary. The heart and mediastinum are unremarkable. IMPRESSION: 1. Left-sided pigtail chest tube remains in position about the left lung base, with a small, persistent left apical pneumothorax, not significantly changed compared to prior examination. Small, persistent pleural effusion component about the left lung base. 2. Multiple pulmonary nodules are again noted bilaterally, some of which are cavitary. Electronically Signed   By: Eddie Candle M.D.   On: 03/10/2019 13:48   Ct Chest W Contrast  Result Date: 03/08/2019 CLINICAL DATA:  Septic pulmonary emboli; history rheumatic fever, hypertension, drug abuse EXAM: CT CHEST WITH CONTRAST TECHNIQUE: Multidetector CT imaging of the chest was performed during intravenous contrast administration. Sagittal and coronal MPR images reconstructed from axial data set. CONTRAST:  12m OMNIPAQUE IOHEXOL 300 MG/ML  SOLN IV COMPARISON:  02/28/2019 FINDINGS: Cardiovascular: Vascular structures patent. No pericardial effusion. Heart normal size. Pulmonary arteries grossly unremarkable for phase of imaging. Mediastinum/Nodes: Base of cervical region normal appearance. Patient cachectic. No definite adenopathy. Lungs/Pleura: Multiple masses are identified throughout both lungs, many cavitary,  consistent with septic emboli. Largest RIGHT lung lesion 2.7 x 2.7 cm RIGHT middle lobe image 123. Largest LEFT lung lesion in lingula 4.7 x 4.5 cm image 101. Dependent atelectasis in the posterior lungs bilaterally. Interval decrease in size of significant LEFT pneumothorax component. Partially loculated LEFT pleural effusion with air-fluid level. Upper Abdomen: Small cyst upper LEFT kidney. Remaining visualized upper abdomen unremarkable. Musculoskeletal: No acute osseous lesions. IMPRESSION: Persistent loculated LEFT hydropneumothorax, with decrease in pneumothorax component since previous exam. Septic emboli throughout both lungs, little changed. Electronically Signed   By: MLavonia DanaM.D.   On: 03/08/2019 10:37   Ct Lumbar Spine W Contrast  Result Date: 03/20/2019 CLINICAL DATA:  Abdominal pain and fever. Abscess. At the drug abuse. Progressively worse back pain and bilateral lower extremity weakness. EXAM: CT LUMBAR SPINE WITH CONTRAST TECHNIQUE: Multidetector CT imaging of the lumbar spine was performed with intravenous contrast administration. CONTRAST:  1027mOMNIPAQUE IOHEXOL 300 MG/ML  SOLN COMPARISON:  MRI of the lumbar spine 03/14/2019. CT guided drain placement 03/15/2019 FINDINGS: Segmentation: 5 non rib-bearing lumbar type vertebral bodies are present. The lowest fully formed vertebral body is L5. Alignment: No significant listhesis is present. There is straightening of the normal lumbar lordosis. Rightward curvature is present at the thoracolumbar junction. Compensatory leftward curvature is present at L5-S1. Vertebrae: Lytic changes are noted at the endplates of L4U9-8ith some collapse on right. There is no progression collapse. Vertebral body heights are otherwise maintained. No other focal lytic or blastic lesions are present. Paraspinal and other soft tissues: Right paraspinal drain is in place. The largest collection collapsed. A more posterior and slightly superior peripherally enhancing  collection measures 12 mm. No significant adenopathy is present. Atherosclerotic changes are noted in the aorta without aneurysm. Disc levels: L1-2: Negative. L2-3: No significant erosive changes are present. Leftward disc protrusion is noted. L3-4: A leftward disc protrusion present. Mild facet hypertrophy is  noted bilaterally. L4-5: A broad-based disc protrusion is associated with the infected disc. Moderate foraminal stenosis is again seen bilaterally. L5-S1: Mild disc bulging is present. Facet hypertrophy is worse on the right. IMPRESSION: 1. The right paraspinous scratched at the larger of the 2 right paraspinous fluid collections is drained. 2. The smaller, more superior posterior collection remains, measuring up to 12 mm. 3. Stable lytic endplate changes associated with disc osteomyelitis at L4-5. Collapse is worse on the right, stable from prior exam. 4. Subtle endplate changes posteriorly at L2-3 and at the right facet of L5-S1 without progression. 5.  Aortic Atherosclerosis (ICD10-I70.0). Electronically Signed   By: San Morelle M.D.   On: 03/20/2019 15:41   Mr Lumbar Spine W Wo Contrast  Result Date: 03/27/2019 CLINICAL DATA:  Back pain, infection. EXAM: MRI LUMBAR SPINE WITHOUT AND WITH CONTRAST TECHNIQUE: Multiplanar and multiecho pulse sequences of the lumbar spine were obtained without and with intravenous contrast. CONTRAST:  50m GADAVIST GADOBUTROL 1 MMOL/ML IV SOLN COMPARISON:  CT scan 03/20/2019 MRI from 03/14/2019 FINDINGS: Segmentation: The lowest lumbar type non-rib-bearing vertebra is labeled as L5. Alignment:  No vertebral subluxation is observed. Vertebrae: Again observed discitis osteomyelitis at the L4-5 level with endplate edema and sclerosis, and irregular rim enhancing collections along the anterior intervertebral disc space. There is bilateral facet edema and enhancement at this level, and septic arthritis of the facet joints is not excluded given the appearance. Endplate  enhancement and type 1 degenerative endplate findings at LW5-8 with subtle accentuated T2 signal in the L2-3 intervertebral disc, a second site of early discitis is likely. Stable 6 mm focus of T2 hyperintensity in the T12 vertebral body, probably a small hemangioma or similar benign lesion. Left eccentric type 1 degenerative endplate findings at [id number] Conus medullaris and cauda equina: Conus extends to the L1-2. Paraspinal and other soft tissues: Small abscesses are present along the right psoas muscle with surrounding myositis. This includes a 0.9 by 1.0 by 2.0 cm abscess on image 34/6 anteriorly, and a 0.9 cm posterior right psoas abscess on image 30/6. Paraspinal phlegmon is most prominent at the L4-5 level and surrounds the vertebral level, and also at the L2-3 level and adjacent levels up to T12. The previous enhancing epidural phlegmon at the L4-5 level is less striking than on the prior exam. There is abnormal enhancement extending in the L4-5 and to a lesser extent L5-S1 neural foramina bilaterally. I do not see a well-defined epidural abscess. Disc levels: T12-L1: Unremarkable. L1-2: No impingement. L2-3: Borderline left subarticular lateral recess stenosis due to left eccentric intervertebral spurring and facet spurring. L3-4: Mild left subarticular lateral recess stenosis and mild left foraminal stenosis due to left lateral recess and foraminal disc protrusion, disc bulge, and mild facet arthropathy. L4-5: At least mild bilateral foraminal stenosis due to facet and intervertebral spurring. L5-S1: Borderline bilateral foraminal stenosis due to disc bulge, intervertebral spurring, and facet arthropathy. IMPRESSION: 1. Active discitis-osteomyelitis at L4-5 and L2-3, with small right psoas muscle abscesses (reduced in size from prior exam) and considerable paraspinal phlegmon tracking between T12 L5. The amount of anterior epidural phlegmon at the L4-5 level is improved, but there is continued abnormal  enhancement in both neural foramina at L4-5 and L5-S1. In addition, the facet joints at L4-5 demonstrate severe arthropathy and septic facet joints are not excluded. 2. Lumbar spondylosis and degenerative disc disease with mild impingement at L3-4 and L4-5. Electronically Signed   By: WVan ClinesM.D.   On:  03/27/2019 18:48   Mr Lumbar Spine W Wo Contrast  Result Date: 03/14/2019 CLINICAL DATA:  Back pain, infection suspected. Patient with disseminated MSSA infection multiple abscesses. Worsening lower back pain not responding to pain management. Follow-up known osteomyelitis in spine. EXAM: MRI LUMBAR SPINE WITHOUT AND WITH CONTRAST TECHNIQUE: Multiplanar and multiecho pulse sequences of the lumbar spine were obtained without and with intravenous contrast. CONTRAST:  4m GADAVIST GADOBUTROL 1 MMOL/ML IV SOLN COMPARISON:  Lumbar spine MRI 02/21/2019 FINDINGS: Segmentation: For the purposes of this dictation, five lumbar vertebrae are assumed and the caudal most well-formed intervertebral disc is designated L5-S1. Alignment: Straightening of the expected lumbar lordosis. No significant spondylolisthesis. Lumbar levocurvature. Vertebrae: Vertebral body height is maintained. Endplate irregularity at L4-L5 has increased as compared to prior examination. Again demonstrated there is extensive abnormal T2 hyperintense signal throughout the L4 and L5 vertebrae extending to the disc space with associated abnormal enhancement. Findings consistent with discitis/osteomyelitis. As before, there is fluid within the bilateral L4-L5 facet joints (greater on the right) and septic arthritis is difficult to exclude (series 11, image 29). More conspicuous than on prior examination, there is abnormal T2 hyperintensity and enhancement within the L2 and L3 vertebrae along the disc space. Findings highly suspicious for an additional level of discitis/osteomyelitis. Conus medullaris and cauda equina: Conus extends to the L2  level. No signal abnormality within the visualized distal spinal cord. New from prior MRI, there is ventral epidural phlegmon at the L4-L5 level measuring 0.6 cm in AP dimension (series 10, image 7). Paraspinal and other soft tissues: There is nonspecific soft tissue edema and enhancement within the lumbar paraspinal soft tissues, greatest at L4-L5 level. More conspicuous than on prior examination. There is abnormal paraspinal enhancing soft tissue consistent with phlegmon at the L4-L5 level extending to involve both psoas muscles. This extends into the bilateral L4-L5 neural foramina and partially into the bilateral L5-S1 neural foramina. Again demonstrated are bilateral psoas abscesses, the largest on the right measuring 2.7 x 2.4 x 4.4 cm (series 6, image 33) (series 9, image 13). Disc levels: T12-L1: No disc herniation. No significant canal or foraminal stenosis. L1-L2: No disc herniation. No significant canal or foraminal stenosis. L2-L3: Disc bulge with endplate spurring. New from prior examination there is a left center/subarticular caudally migrated disc extrusion. Mild facet arthrosis. The disc extrusion contributes to significant left subarticular stenosis with encroachment upon the descending left L3 nerve root. No significant central canal stenosis or neural foraminal narrowing. L3-L4: Disc bulge asymmetric to the left with associated endplate spurring. Mild facet arthrosis. Mild left subarticular and bilateral neural foraminal narrowing. No significant central canal stenosis. L4-L5: Disc bulge with endplate remodeling. Ventral epidural phlegmon as described. No significant spinal canal stenosis. As before, abnormal enhancement likely reflecting phlegmon extends into both neural foramina with severe bilateral neural foraminal narrowing. L5-S1: Small disc bulge. Mild facet arthrosis. No significant spinal canal stenosis. Abnormal soft tissue enhancement likely reflecting phlegmon extends into the  bilateral neural foramina. IMPRESSION: 1. Redemonstrated findings of L4-L5 discitis/osteomyelitis. Endplate irregularity at this level has progressed. Persistent fluid within the bilateral L4-L5 facet joints (greater on the right). For which septic arthritis is difficult to exclude. New ventral epidural phlegmon at this level measuring 0.6 cm in AP dimension without significant canal stenosis. Persistent extensive paraspinal phlegmon at this level extending into the bilateral L4-L5 neural foramina with resultant severe neural foraminal narrowing. Phlegmon also extends partially into the bilateral L5-S1 neural foramina. 2. Signal abnormality and abnormal enhancement  now present at the L2-L3 level also suspicious for discitis/osteomyelitis. 3. Redemonstrated bilateral psoas abscesses, the largest on the right measuring 2.7 x 2.4 x 4.4 cm. 4. New L2-L3 left center/subarticular caudally migrated disc extrusion. Resultant left subarticular stenosis with encroachment upon the descending left L3 nerve root. Electronically Signed   By: Kellie Simmering DO   On: 03/14/2019 13:07   Ct Hip Right Wo Contrast  Result Date: 03/31/2019 CLINICAL DATA:  Severe right hip pain for 5 days. No known injury. Suspected hip fracture. EXAM: CT OF THE RIGHT HIP WITHOUT CONTRAST TECHNIQUE: Multidetector CT imaging of the right hip was performed according to the standard protocol. Multiplanar CT image reconstructions were also generated. COMPARISON:  Pelvic CT 06/14/2006. FINDINGS: Bones/Joint/Cartilage Examination is targeted to the right hip and inferior right hemipelvis. There is no evidence of acute fracture, dislocation or femoral head avascular necrosis. The bones appear adequately mineralized. There is no significant right hip arthropathy or joint effusion. Ligaments Suboptimally assessed by CT. Muscles and Tendons No focal muscular abnormalities are identified. Soft tissues There is mild generalized soft tissue edema without focal  fluid collection, foreign body or soft tissue emphysema. The visualized internal pelvic contents appear unremarkable. IMPRESSION: 1. No evidence of acute right hip fracture or dislocation. 2. Mild generalized soft tissue edema. Electronically Signed   By: Richardean Sale M.D.   On: 03/31/2019 15:16   Dg Chest Port 1 View  Result Date: 03/29/2019 CLINICAL DATA:  Fever, heroin abuse EXAM: PORTABLE CHEST 1 VIEW COMPARISON:  03/16/2019 FINDINGS: Multiple bilateral pulmonary nodules which have improved compared with the prior examination of 03/16/2019. No new focal consolidation. No pleural effusion. Small residual left apical pneumothorax. Stable cardiomediastinal silhouette. No aggressive osseous lesion. IMPRESSION: 1. Bilateral pulmonary nodules which are improved compared with the prior examination of 03/16/2019. Small residual left apical pneumothorax. Electronically Signed   By: Kathreen Devoid   On: 03/29/2019 15:32   Dg Chest Port 1 View  Result Date: 03/15/2019 CLINICAL DATA:  Hydropneumothorax. EXAM: PORTABLE CHEST 1 VIEW COMPARISON:  March 14, 2019. FINDINGS: The heart size and mediastinal contours are within normal limits. Stable mild left apical pneumothorax is noted. Stable bilateral nodular densities are noted some of which are cavitary, consistent with septic emboli. The visualized skeletal structures are unremarkable. IMPRESSION: Stable mild left apical pneumothorax is noted. Stable bilateral nodular densities are noted, some of which are cavitary, consistent with septic emboli. Electronically Signed   By: Marijo Conception M.D.   On: 03/15/2019 13:03   Dg Chest Port 1 View  Result Date: 03/14/2019 CLINICAL DATA:  Chest tube removal EXAM: PORTABLE CHEST 1 VIEW COMPARISON:  03/14/2019, 03/13/2019, 03/10/2019 FINDINGS: Interim removal of left-sided chest tube. Overall no significant interval change in size of small residual left apicolateral pneumothorax. There is probable trace pneumothorax at  the left CP angle. Stable cardiomediastinal silhouette. Multiple bilateral nodules and cavitary lesions as before. IMPRESSION: 1. Removal of left chest tube. 2. Overall no significant interval change in size of small residual left apicolateral pneumothorax. Now seen is a small amount of pneumothorax component at the left CP angle. 3. Stable bilateral lung nodules and cavitary lesions. Electronically Signed   By: Donavan Foil M.D.   On: 03/14/2019 16:55   Dg Chest Port 1 View  Result Date: 03/14/2019 CLINICAL DATA:  Hydropneumothorax EXAM: PORTABLE CHEST 1 VIEW COMPARISON:  03/13/2019 FINDINGS: No significant change in AP portable examination with a persistent small left lateral and apical pneumothorax component and  a left-sided pigtail chest tube in position about the lateral lung base. Probable small persistent effusion component. No change in multiple masses and nodules bilaterally, some of which are cavitary. The heart and mediastinum are unremarkable. IMPRESSION: 1. No significant change in AP portable examination with a persistent small left lateral and apical pneumothorax component and a left-sided pigtail chest tube in position about the lateral lung base. Probable small persistent effusion component. 2. No change in multiple masses and nodules bilaterally, some of which are cavitary. Electronically Signed   By: Eddie Candle M.D.   On: 03/14/2019 09:58   Dg Chest Port 1 View  Result Date: 03/13/2019 CLINICAL DATA:  48 year male with shortness of breath and left-sided pneumothorax. History of septic emboli. EXAM: PORTABLE CHEST 1 VIEW COMPARISON:  Chest radiograph dated 03/10/2019. FINDINGS: Left-sided chest tube in similar position. Overall slight interval increase in the size of the left pneumothorax since the radiograph of 03/10/2019. Continued follow-up recommended. No mediastinal shift. Bilateral nodular and confluent airspace densities similar to prior radiograph. There is a small  left pleural effusion. Stable cardiac silhouette. No acute osseous pathology. IMPRESSION: 1. Slight interval increase in the size of the left pneumothorax since the radiograph of 03/10/19. Continued follow-up recommended. 2. Bilateral nodular and confluent airspace densities similar to prior radiograph. Electronically Signed   By: Anner Crete M.D.   On: 03/13/2019 12:26   Dg Chest Port 1 View  Result Date: 03/07/2019 CLINICAL DATA:  Left chest tube in place for pneumothorax. EXAM: PORTABLE CHEST 1 VIEW COMPARISON:  Single-view of the chest 03/04/2019 03/03/2019. CT chest 02/28/2019. History of septic emboli. FINDINGS: Pigtail catheter remains in place in the lower left chest. Left pneumothorax seen on the most recent examination has slightly increased in size. Multiple cavitary nodules are again seen bilaterally. No right pneumothorax. Heart size is normal. IMPRESSION: Small left pneumothorax seen on the most recent examination has mildly increased in size. Left chest tube remains in place. Multiple cavitary nodules bilaterally consistent with septic emboli. Electronically Signed   By: Inge Rise M.D.   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 Patient presents today for CT-guided aspiration and/or drainage catheter placement. No, patient previously underwent CT-guided paraspinal aspiration on 02/22/2019. EXAM: CT IMAGE GUIDED DRAINAGE BY PERCUTANEOUS CATHETER COMPARISON:  Lumbar spine MRI-03/14/2019; CT-guided paraspinal abscess aspiration-02/22/2019 MEDICATIONS: The patient is currently admitted to the hospital and receiving intravenous antibiotics. The antibiotics were administered within an appropriate time frame prior to the initiation of the procedure. ANESTHESIA/SEDATION: Moderate (conscious) sedation was employed during this procedure. A  total of Benadryl 25 mg IV; versed 3 mg and Fentanyl 100 mcg was administered intravenously. Moderate Sedation Time: 14 minutes. The patient's level of consciousness and vital signs were monitored continuously by radiology nursing throughout the procedure under my direct supervision. CONTRAST:  None COMPLICATIONS: None immediate. PROCEDURE: Informed written consent was obtained from the patient after a discussion of the risks, benefits and alternatives to treatment. The patient was placed prone on the CT gantry and a pre procedural CT was performed re-demonstrating the known abscess/fluid collection within the inferior aspect the right iliopsoas musculature with dominant component measuring approximately 2.6 x 2.4 cm (25, series 2) the procedure was planned. A timeout was performed prior to the initiation of the procedure. The skin overlying the posterior inferior back was prepped and draped in the usual sterile  fashion. The overlying soft tissues were anesthetized with 1% lidocaine with epinephrine. Appropriate trajectory was planned with the use of a 22 gauge spinal needle. An 18 gauge trocar needle was advanced into the abscess/fluid collection and a short Amplatz super stiff wire was coiled within the collection. Appropriate positioning was confirmed with a limited CT scan. The tract was serially dilated allowing placement of a 10 [place] all-purpose drainage catheter. Appropriate positioning was confirmed with a limited postprocedural CT scan. Approximately 15 ml of purulent fluid was aspirated. The tube was connected to a JP bulb and sutured in place. A dressing was placed. The patient tolerated the procedure well without immediate post procedural complication. IMPRESSION: Successful CT guided placement of a 10 French all purpose drain catheter into the recurrent right paraspinal abscess with aspiration of 15 mL of purulent fluid. Samples were sent to the laboratory as requested by the ordering clinical team.  PLAN: - Flush the percutaneous drainage catheter with 10 cc of normal saline b.i.d. - Record daily output from the percutaneous drainage catheter. - once drainage catheter output is less than 10 cc per day (excluding flush), repeat contrast-enhanced CT scan of the abdomen and pelvis is recommended to ensure abscess resolution. Ultimately, the percutaneous drainage catheter may be removed once the abscess has resolved on CT imaging, not requiring dedicated percutaneous drainage catheter injection given lack clinical concern for an enteric fistula. Electronically Signed   By: Sandi Mariscal M.D.   On: 03/15/2019 17:06   Vas [place] Upper Extremity Venous Duplex  Result Date: 03/07/2019 UPPER VENOUS STUDY  Indications: Pain Comparison Study: no prior Performing Technologist: Abram Sander RVS  Examination Guidelines: A complete evaluation includes B-mode imaging, spectral Doppler, color Doppler, and power Doppler as needed of all accessible portions of each vessel. Bilateral testing is considered an integral part of a complete examination. Limited examinations for reoccurring indications may be performed as noted.  Right Findings: +----------+------------+---------+-----------+----------+-------+  RIGHT      Compressible Phasicity Spontaneous Properties Summary  +----------+------------+---------+-----------+----------+-------+  Subclavian                 Yes        Yes                         +----------+------------+---------+-----------+----------+-------+  Left Findings: +----------+------------+---------+-----------+----------+-------+  LEFT       Compressible Phasicity Spontaneous Properties Summary  +----------+------------+---------+-----------+----------+-------+  IJV            Full        Yes        Yes                         +----------+------------+---------+-----------+----------+-------+  Subclavian     Full        Yes        Yes                          +----------+------------+---------+-----------+----------+-------+  Axillary       Full        Yes        Yes                         +----------+------------+---------+-----------+----------+-------+  Brachial       Full        Yes        Yes                         +----------+------------+---------+-----------+----------+-------+  Radial         Full                                               +----------+------------+---------+-----------+----------+-------+  Ulnar          Full                                               +----------+------------+---------+-----------+----------+-------+  Cephalic       Full                                               +----------+------------+---------+-----------+----------+-------+  Basilic        Full                                               +----------+------------+---------+-----------+----------+-------+  Summary:  Right: No evidence of thrombosis in the subclavian.  Left: No evidence of deep vein thrombosis in the upper extremity. No evidence of superficial vein thrombosis in the upper extremity. No evidence of thrombosis in the subclavian.  *See table(s) above for measurements and observations.  Diagnosing physician: Harold Barban MD Electronically signed by Harold Barban MD on 03/07/2019 at 5:52:52 PM.    Final     Lab Data:  CBC: Recent Labs  Lab 04/03/19 0855  WBC 7.0  HGB 10.2*  HCT 33.4*  MCV 96.0  PLT 160   Basic Metabolic Panel: Recent Labs  Lab 03/31/19 1020 04/03/19 0855  NA 137 138  K 3.7 4.1  CL 100 98  CO2 25 29  GLUCOSE 166* 96  BUN 18 14  CREATININE 0.51* 0.39*  CALCIUM 8.5* 9.5   GFR: Estimated Creatinine Clearance: 78.8 mL/min (A) (by C-G formula based on SCr of 0.39 mg/dL (L)). Liver Function Tests: No results for input(s): AST, ALT, ALKPHOS, BILITOT, PROT, ALBUMIN in the last 168 hours. No results for input(s): LIPASE, AMYLASE in the last 168 hours. No results for input(s): AMMONIA in the last 168  hours. Coagulation Profile: No results for input(s): INR, PROTIME in the last 168 hours. Cardiac Enzymes: No results for input(s): CKTOTAL, CKMB, CKMBINDEX, TROPONINI in the last 168 hours. BNP (last 3 results) No results for input(s): PROBNP in the last 8760 hours. HbA1C: No results for input(s): HGBA1C in the last 72 hours. CBG: Recent Labs  Lab 03/31/19 0748 03/31/19 1131 03/31/19 1622 03/31/19 2233 04/01/19 0737  GLUCAP 96 109* 100* 131* 91   Lipid Profile: No results for input(s): CHOL, HDL, LDLCALC, TRIG, CHOLHDL, LDLDIRECT in the last 72 hours. Thyroid Function Tests: No results for input(s): TSH, T4TOTAL, FREET4, T3FREE, THYROIDAB in the last 72 hours. Anemia Panel: No results for input(s): VITAMINB12, FOLATE, FERRITIN, TIBC, IRON, RETICCTPCT in the last 72 hours. Urine analysis:    Component Value Date/Time   BILIRUBINUR neg 12/21/2017 1551   PROTEINUR Negative 12/21/2017 1551   UROBILINOGEN 0.2 12/21/2017 1551   NITRITE neg 12/21/2017 1551   LEUKOCYTESUR Negative 12/21/2017 1551  Estill Cotta M.D. [facility] 04/04/2019, 2:43 PM   Call night coverage person covering after 7pm

## 2019-04-05 DIAGNOSIS — G062 Extradural and subdural abscess, unspecified: Secondary | ICD-10-CM

## 2019-04-05 LAB — BASIC METABOLIC PANEL
Anion gap: 8 (ref 5–15)
BUN: 28 mg/dL — ABNORMAL HIGH (ref 6–20)
CO2: 29 mmol/L (ref 22–32)
Calcium: 9.2 mg/dL (ref 8.9–10.3)
Chloride: 101 mmol/L (ref 98–111)
Creatinine, Ser: 0.49 mg/dL — ABNORMAL LOW (ref 0.61–1.24)
GFR calc Af Amer: >60 mL/min (ref >60)
GFR calc non Af Amer: >60 mL/min (ref >60)
Glucose, Bld: 93 mg/dL (ref 70–99)
Potassium: 4.4 mmol/L (ref 3.5–5.1)
Sodium: 138 mmol/L (ref 135–145)

## 2019-04-05 LAB — CBC
HCT: 31.7 % — ABNORMAL LOW (ref 39.0–52.0)
Hemoglobin: 9.9 g/dL — ABNORMAL LOW (ref 13.0–17.0)
MCH: 30.4 pg (ref 26.0–34.0)
MCHC: 31.2 g/dL (ref 30.0–36.0)
MCV: 97.2 fL (ref 80.0–100.0)
Platelets: 371 10*3/uL (ref 150–400)
RBC: 3.26 MIL/uL — ABNORMAL LOW (ref 4.22–5.81)
RDW: 16.7 % — ABNORMAL HIGH (ref 11.5–15.5)
WBC: 6.4 10*3/uL (ref 4.0–10.5)
nRBC: 0 % (ref 0.0–0.2)

## 2019-04-05 NOTE — Progress Notes (Signed)
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 Pt was very pleasant. General bed mobility comments: Pt required increased time and use of bed rails to sit up on EOB. Pt was able to lift B LE up back into bed on his own today. General transfer comment: Pt was able to pull up to a standard RW where as yesterday he was using the B platform walker.General Gait Details: Pt was able to use a standard RW rather than the B platform walker. Pt stated that his R hip was feeling better the more he walked. Pt complained less of pain today. Pt was supervision/CGA for safety. Pt stated that he wants to get better and be able to walk without a walker.   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 Pt was able to lift B LE up back into bed on his own today.  Transfers Overall transfer level: Modified independent Equipment used: Rolling walker  (2 wheeled) Transfers: Sit to/from Stand Sit to Stand: From elevated surface;Supervision;Min guard         General transfer comment: Pt was able to pull up to a standard RW where as yesterday he was using the B platform walker.  Ambulation/Gait Ambulation/Gait assistance: Supervision;Min guard Gait Distance (Feet): 200 Feet Assistive device: Rolling walker (2 wheeled) Gait Pattern/deviations: Decreased step length - right;Decreased step length - left;Step-through pattern;Decreased stride length     General Gait Details: Pt was able to use a standard RW rather than the B platform walker. Pt stated that his R hip was feeling better the more he walked. Pt complained less of pain today. Pt was supervision/CGA for safety   Stairs             Wheelchair Mobility    Modified Rankin (Stroke Patients Only)       Balance                                            Cognition Arousal/Alertness: Awake/alert Behavior During Therapy: WFL for tasks assessed/performed Overall Cognitive Status: Within Functional Limits for tasks assessed                                        Exercises      General Comments  Pertinent Vitals/Pain Pain Assessment: 0-10 Pain Score: 7  Pain Location: R hip Pain Descriptors / Indicators: Guarding;Grimacing;Sharp;Shooting;Discomfort Pain Intervention(s): Monitored during session;Repositioned    Home Living                      Prior Function            PT Goals (current goals can now be found in the care plan section) Progress towards PT goals: Progressing toward goals    Frequency    Min 2X/week      PT Plan Current plan remains appropriate    Co-evaluation              AM-PAC PT "6 Clicks" Mobility   Outcome Measure  Help needed turning from your back to your side while in a flat bed without using bedrails?: A Lot Help needed moving from lying on your back to sitting  on the side of a flat bed without using bedrails?: A Lot Help needed moving to and from a bed to a chair (including a wheelchair)?: A Little Help needed standing up from a chair using your arms (e.g., wheelchair or bedside chair)?: A Lot Help needed to walk in hospital room?: A Little Help needed climbing 3-5 steps with a railing? : Total 6 Click Score: 13    End of Session Equipment Utilized During Treatment: Gait belt Activity Tolerance: Patient tolerated treatment well Patient left: in bed;with call bell/phone within reach Nurse Communication: Mobility status PT Visit Diagnosis: Muscle weakness (generalized) (M62.81);Difficulty in walking, not elsewhere classified (R26.2)     Time: 3149-7026 PT Time Calculation (min) (ACUTE ONLY): 27 min  Charges:  $Gait Training: 8-22 mins $Therapeutic Activity: 8-22 mins                     Excell Seltzer, [facility]

## 2019-04-05 NOTE — Progress Notes (Signed)
[facility]                                                                              Patient Demographics  Franklin Anderson, is a 48 y.o. male, DOB - [birth year], PRF:[id number]  Admit date - 02/21/2019   Admitting Physician Etta Quill, DO  Outpatient Primary MD for the patient is Mack Hook, MD  Outpatient specialists:   LOS - 42  days   Medical records reviewed and are as summarized below:    Chief Complaint  Patient presents with   Back Pain   Heroin Detox   Weight Loss       Brief summary   Franklin Anderson 48 y.o.malewith medical history significant of rheumatic fever, HTN, ongoingIV heroin abuse, nicotine abuse, presented to the ED on 10/27 with 3 week course of progressively worsening back pain and BLE weakness associated with some bowel incontinence and trouble walking. Last use of heroin was on the day of admission. In ED > Found to have sepsis with Tm 100, HR 126, RR 31, WBC 22.4k,AKI with BUN 111 and creat 1.7. Imaging studies revealed osteomyelitis and diskitis of L4-L5,septic pulmonary emboli and psoas abscesses. He was started on IV antibiotics and admitted to [facility]. On 10/28 was found to be obtunded and dyspneic and was intubated. Blood cultures from admission grew out MSSA, subsequently found to have RV vegetation,  left sided empyema (MSSA) and  a left pneumothorax for which he received a chest tube.  He also received a drain in his psoas abscess.  Both chest tube and drain have been removed and ID has signed off with instructions to continue IV antibiotics until 12/16. Patient on multiple pain meds due to high pain threshold (IV drug abuse)   Assessment & Plan   Sepsis with MSSA bacteremia, L4-L5 osteomyelitis and discitis, epidural abscess, psoas abscess, septic pulmonary emboli, empyema and right ventricular mural vegetation Currently afebrile, with no leukocytosis Right psoas abscess was aspirated on 10/28, culture positive  for MSSA. Respiratory culture 10/28 also MSSA 2D echo 10/28 showed possible mitral valve vegetation and moderate pericardial effusion Underwent TEE on 11/3 showed medium sized, mobile mass attached to the trabeculation in the RV and moderate sized pericardial effusion Per CT surgery, not a good candidate for thoracotomy MRI on 11/17 showed increasing psoas abscess s/p catheter drainage of right paraspinal abscess by IR on 11/18 ID recommended continue IV Ancef through 12/16 followed by Keflex for 30 days Continue IV Ancef Pain management consists of Toradol, fentanyl patch, Flexeril, oral Dilaudid as needed and oral oxycodone prn for breakthrough pain. Monitor for narcotic seeking behavior  Acute respiratory failure with hypoxemia secondary to MSSA infection, septic pulmonary emboli, left empyema, left bronchopleural fistula (hydropneumothorax) Resolved, currently on RA Patient was intubated on 10/28-10/31 Underwent bronchoscopy with BAL, s/p left chest tube, removed on 11/17 Chest x-ray 12/2 showed bilateral pulmonary nodules, which are improved compared to prior exam on 11/19.  Small residual left apical pneumothorax   Moderate pericardial effusion Seen on 2D echo and TEE, treated conservatively and improving Last echo on 11/9 showed mild effusion (improvement)  Essential hypertension BP  stable Continue Norvasc, metoprolol  Severe protein calorie malnutrition Continue dietary supplements  HCV positive Per ID, seems to have cleared  Pressure injury Sacrum with stage I, Right hip deep tissue injury Wound care per nursing   Code Status: Full code DVT Prophylaxis:  Lovenox  Family Communication: None at bedside Disposition Plan: Remains inpatient till completes IV antibiotics, completes on 12/16   Procedures:  TEE Intubation   Consultants:   PCCM, cardiology, ID, palliative care, cardiothoracic surgery  Antimicrobials:   Anti-infectives (From admission, onward)     Start     Dose/Rate Route Frequency Ordered Stop   02/24/19 2200  vancomycin (VANCOCIN) IVPB 1000 mg/200 mL premix  Status:  Discontinued     1,000 mg 200 mL/hr over 60 Minutes Intravenous Every 12 hours 02/24/19 1010 02/24/19 1253   02/24/19 1600  ceFAZolin (ANCEF) IVPB 2g/100 mL premix     2 g 200 mL/hr over 30 Minutes Intravenous Every 8 hours 02/24/19 1253 04/12/19 2359   02/23/19 2200  vancomycin (VANCOCIN) IVPB 750 mg/150 ml premix  Status:  Discontinued     750 mg 150 mL/hr over 60 Minutes Intravenous Every 24 hours 02/23/19 0853 02/23/19 0909   02/23/19 2200  vancomycin (VANCOCIN) IVPB 750 mg/150 ml premix  Status:  Discontinued     750 mg 150 mL/hr over 60 Minutes Intravenous Every 12 hours 02/23/19 0909 02/24/19 1010   02/22/19 2200  vancomycin (VANCOCIN) IVPB 750 mg/150 ml premix  Status:  Discontinued     750 mg 150 mL/hr over 60 Minutes Intravenous Every 24 hours 02/22/19 0826 02/22/19 0827   02/22/19 2200  vancomycin (VANCOCIN) IVPB 1000 mg/200 mL premix  Status:  Discontinued     1,000 mg 200 mL/hr over 60 Minutes Intravenous Every 24 hours 02/22/19 0827 02/23/19 0853   02/22/19 0900  ceFEPIme (MAXIPIME) 2 g in sodium chloride 0.9 % 100 mL IVPB  Status:  Discontinued     2 g 200 mL/hr over 30 Minutes Intravenous Every 12 hours 02/22/19 0826 02/23/19 1135   02/21/19 2015  ceFEPIme (MAXIPIME) 2 g in sodium chloride 0.9 % 100 mL IVPB     2 g 200 mL/hr over 30 Minutes Intravenous  Once 02/21/19 2013 02/22/19 0031   02/21/19 2015  metroNIDAZOLE (FLAGYL) IVPB 500 mg     500 mg 100 mL/hr over 60 Minutes Intravenous  Once 02/21/19 2013 02/22/19 0031   02/21/19 2015  vancomycin (VANCOCIN) IVPB 1000 mg/200 mL premix     1,000 mg 200 mL/hr over 60 Minutes Intravenous  Once 02/21/19 2013 02/22/19 0200         Medications  Scheduled Meds:  acetaminophen  1,000 mg Oral TID   amLODipine  5 mg Oral Daily   cyclobenzaprine  10 mg Oral TID   enoxaparin (LOVENOX)  injection  40 mg Subcutaneous Q24H   feeding supplement (ENSURE ENLIVE)  237 mL Oral BID BM   fentaNYL  1 patch Transdermal Q72H   ketorolac  15 mg Intravenous Q6H   lidocaine  1 patch Transdermal Q24H   mouth rinse  15 mL Mouth Rinse BID   metoprolol tartrate  100 mg Oral BID   multivitamin with minerals  1 tablet Oral Daily   pantoprazole  40 mg Oral Daily   polyethylene glycol  17 g Oral Daily   Continuous Infusions:  sodium chloride 1,000 mL (04/03/19 2141)    ceFAZolin (ANCEF) IV 2 g (04/05/19 1354)   PRN Meds:.sodium chloride, acetaminophen **OR** acetaminophen, clonazePAM,  hydrALAZINE, HYDROmorphone, ipratropium-albuterol, ondansetron **OR** ondansetron (ZOFRAN) IV, oxyCODONE, senna-docusate      Subjective:  Pt reports pain is better controlled with current pain regimen. Denies any other new complaints, no chest pain, abdominal pain, N/V, fever/chills  Objective:   Vitals:   04/04/19 1449 04/04/19 2109 04/05/19 0455 04/05/19 1432  BP: 122/85 120/89 127/90 (!) 142/99  Pulse: 93 97 77 (!) 105  Resp: _0 Temp: 97.9 F (36.6 C) 97.9 F (36.6 C) (!) 97.4 F (36.3 C) 98 F (36.7 C)  TempSrc:   Oral   SpO2: 100% 97% 98% 98%  Weight:      Height:        Intake/Output Summary (Last 24 hours) at 04/05/2019 1745 Last data filed at 04/05/2019 1355 Gross per 24 hour  Intake 360 ml  Output 1400 ml  Net -1040 ml     Wt Readings from Last 3 Encounters:  03/15/19 49.9 kg  12/21/17 65.3 kg  04/22/16 68 kg    Physical Exam  General: NAD   Cardiovascular: S1, S2 present  Respiratory: CTAB  Abdomen: Soft, nontender, nondistended, bowel sounds present  Musculoskeletal: No bilateral pedal edema noted  Skin: Normal  Psychiatry: Normal mood      Data Reviewed:  I have personally reviewed following labs and imaging studies  Micro Results No results found for this or any previous visit (from the past 240 hour(s)).  Radiology Reports Dg  Chest 1 View  Result Date: 03/16/2019 CLINICAL DATA:  Pleural effusion. EXAM: CHEST  1 VIEW COMPARISON:  March 15, 2019. FINDINGS: The heart size and mediastinal contours are within normal limits. Stable mild left apical and basilar pneumothorax is noted. Stable bilateral nodular densities are noted, some of which are cavitary, consistent with septic emboli. The visualized skeletal structures are unremarkable. IMPRESSION: Stable mild left pneumothorax. Stable bilateral nodular densities are again noted consistent with septic emboli. Electronically Signed   By: Marijo Conception M.D.   On: 03/16/2019 13:49   Dg Chest 2 View  Result Date: 03/10/2019 CLINICAL DATA:  Chest tube, left pleural effusion EXAM: CHEST - 2 VIEW COMPARISON:  03/08/2019 FINDINGS: Left-sided pigtail chest tube remains in position about the left lung base, with a small, persistent left apical pneumothorax, not significantly changed compared to prior examination. Small, persistent pleural effusion component about the left lung base. Multiple pulmonary nodules are again noted bilaterally, some of which are cavitary. The heart and mediastinum are unremarkable. IMPRESSION: 1. Left-sided pigtail chest tube remains in position about the left lung base, with a small, persistent left apical pneumothorax, not significantly changed compared to prior examination. Small, persistent pleural effusion component about the left lung base. 2. Multiple pulmonary nodules are again noted bilaterally, some of which are cavitary. Electronically Signed   By: Eddie Candle M.D.   On: 03/10/2019 13:48   Ct Chest W Contrast  Result Date: 03/08/2019 CLINICAL DATA:  Septic pulmonary emboli; history rheumatic fever, hypertension, drug abuse EXAM: CT CHEST WITH CONTRAST TECHNIQUE: Multidetector CT imaging of the chest was performed during intravenous contrast administration. Sagittal and coronal MPR images reconstructed from axial data set. CONTRAST:  80m  OMNIPAQUE IOHEXOL 300 MG/ML  SOLN IV COMPARISON:  02/28/2019 FINDINGS: Cardiovascular: Vascular structures patent. No pericardial effusion. Heart normal size. Pulmonary arteries grossly unremarkable for phase of imaging. Mediastinum/Nodes: Base of cervical region normal appearance. Patient cachectic. No definite adenopathy. Lungs/Pleura: Multiple masses are identified throughout both lungs, many cavitary, consistent with septic emboli. Largest  RIGHT lung lesion 2.7 x 2.7 cm RIGHT middle lobe image 123. Largest LEFT lung lesion in lingula 4.7 x 4.5 cm image 101. Dependent atelectasis in the posterior lungs bilaterally. Interval decrease in size of significant LEFT pneumothorax component. Partially loculated LEFT pleural effusion with air-fluid level. Upper Abdomen: Small cyst upper LEFT kidney. Remaining visualized upper abdomen unremarkable. Musculoskeletal: No acute osseous lesions. IMPRESSION: Persistent loculated LEFT hydropneumothorax, with decrease in pneumothorax component since previous exam. Septic emboli throughout both lungs, little changed. Electronically Signed   By: Lavonia Dana M.D.   On: 03/08/2019 10:37   Ct Lumbar Spine W Contrast  Result Date: 03/20/2019 CLINICAL DATA:  Abdominal pain and fever. Abscess. At the drug abuse. Progressively worse back pain and bilateral lower extremity weakness. EXAM: CT LUMBAR SPINE WITH CONTRAST TECHNIQUE: Multidetector CT imaging of the lumbar spine was performed with intravenous contrast administration. CONTRAST:  172m OMNIPAQUE IOHEXOL 300 MG/ML  SOLN COMPARISON:  MRI of the lumbar spine 03/14/2019. CT guided drain placement 03/15/2019 FINDINGS: Segmentation: 5 non rib-bearing lumbar type vertebral bodies are present. The lowest fully formed vertebral body is L5. Alignment: No significant listhesis is present. There is straightening of the normal lumbar lordosis. Rightward curvature is present at the thoracolumbar junction. Compensatory leftward curvature is  present at L5-S1. Vertebrae: Lytic changes are noted at the endplates of LB1-4with some collapse on right. There is no progression collapse. Vertebral body heights are otherwise maintained. No other focal lytic or blastic lesions are present. Paraspinal and other soft tissues: Right paraspinal drain is in place. The largest collection collapsed. A more posterior and slightly superior peripherally enhancing collection measures 12 mm. No significant adenopathy is present. Atherosclerotic changes are noted in the aorta without aneurysm. Disc levels: L1-2: Negative. L2-3: No significant erosive changes are present. Leftward disc protrusion is noted. L3-4: A leftward disc protrusion present. Mild facet hypertrophy is noted bilaterally. L4-5: A broad-based disc protrusion is associated with the infected disc. Moderate foraminal stenosis is again seen bilaterally. L5-S1: Mild disc bulging is present. Facet hypertrophy is worse on the right. IMPRESSION: 1. The right paraspinous scratched at the larger of the 2 right paraspinous fluid collections is drained. 2. The smaller, more superior posterior collection remains, measuring up to 12 mm. 3. Stable lytic endplate changes associated with disc osteomyelitis at L4-5. Collapse is worse on the right, stable from prior exam. 4. Subtle endplate changes posteriorly at L2-3 and at the right facet of L5-S1 without progression. 5.  Aortic Atherosclerosis (ICD10-I70.0). Electronically Signed   By: CSan MorelleM.D.   On: 03/20/2019 15:41   Mr Lumbar Spine W Wo Contrast  Result Date: 03/27/2019 CLINICAL DATA:  Back pain, infection. EXAM: MRI LUMBAR SPINE WITHOUT AND WITH CONTRAST TECHNIQUE: Multiplanar and multiecho pulse sequences of the lumbar spine were obtained without and with intravenous contrast. CONTRAST:  525mGADAVIST GADOBUTROL 1 MMOL/ML IV SOLN COMPARISON:  CT scan 03/20/2019 MRI from 03/14/2019 FINDINGS: Segmentation: The lowest lumbar type non-rib-bearing  vertebra is labeled as L5. Alignment:  No vertebral subluxation is observed. Vertebrae: Again observed discitis osteomyelitis at the L4-5 level with endplate edema and sclerosis, and irregular rim enhancing collections along the anterior intervertebral disc space. There is bilateral facet edema and enhancement at this level, and septic arthritis of the facet joints is not excluded given the appearance. Endplate enhancement and type 1 degenerative endplate findings at L2N8-2with subtle accentuated T2 signal in the L2-3 intervertebral disc, a second site of early discitis is likely.  Stable 6 mm focus of T2 hyperintensity in the T12 vertebral body, probably a small hemangioma or similar benign lesion. Left eccentric type 1 degenerative endplate findings at F35-45. Conus medullaris and cauda equina: Conus extends to the L1-2. Paraspinal and other soft tissues: Small abscesses are present along the right psoas muscle with surrounding myositis. This includes a 0.9 by 1.0 by 2.0 cm abscess on image 34/6 anteriorly, and a 0.9 cm posterior right psoas abscess on image 30/6. Paraspinal phlegmon is most prominent at the L4-5 level and surrounds the vertebral level, and also at the L2-3 level and adjacent levels up to T12. The previous enhancing epidural phlegmon at the L4-5 level is less striking than on the prior exam. There is abnormal enhancement extending in the L4-5 and to a lesser extent L5-S1 neural foramina bilaterally. I do not see a well-defined epidural abscess. Disc levels: T12-L1: Unremarkable. L1-2: No impingement. L2-3: Borderline left subarticular lateral recess stenosis due to left eccentric intervertebral spurring and facet spurring. L3-4: Mild left subarticular lateral recess stenosis and mild left foraminal stenosis due to left lateral recess and foraminal disc protrusion, disc bulge, and mild facet arthropathy. L4-5: At least mild bilateral foraminal stenosis due to facet and intervertebral spurring.  L5-S1: Borderline bilateral foraminal stenosis due to disc bulge, intervertebral spurring, and facet arthropathy. IMPRESSION: 1. Active discitis-osteomyelitis at L4-5 and L2-3, with small right psoas muscle abscesses (reduced in size from prior exam) and considerable paraspinal phlegmon tracking between T12 L5. The amount of anterior epidural phlegmon at the L4-5 level is improved, but there is continued abnormal enhancement in both neural foramina at L4-5 and L5-S1. In addition, the facet joints at L4-5 demonstrate severe arthropathy and septic facet joints are not excluded. 2. Lumbar spondylosis and degenerative disc disease with mild impingement at L3-4 and L4-5. Electronically Signed   By: Van Clines M.D.   On: 03/27/2019 18:48   Mr Lumbar Spine W Wo Contrast  Result Date: 03/14/2019 CLINICAL DATA:  Back pain, infection suspected. Patient with disseminated MSSA infection multiple abscesses. Worsening lower back pain not responding to pain management. Follow-up known osteomyelitis in spine. EXAM: MRI LUMBAR SPINE WITHOUT AND WITH CONTRAST TECHNIQUE: Multiplanar and multiecho pulse sequences of the lumbar spine were obtained without and with intravenous contrast. CONTRAST:  31m GADAVIST GADOBUTROL 1 MMOL/ML IV SOLN COMPARISON:  Lumbar spine MRI 02/21/2019 FINDINGS: Segmentation: For the purposes of this dictation, five lumbar vertebrae are assumed and the caudal most well-formed intervertebral disc is designated L5-S1. Alignment: Straightening of the expected lumbar lordosis. No significant spondylolisthesis. Lumbar levocurvature. Vertebrae: Vertebral body height is maintained. Endplate irregularity at L4-L5 has increased as compared to prior examination. Again demonstrated there is extensive abnormal T2 hyperintense signal throughout the L4 and L5 vertebrae extending to the disc space with associated abnormal enhancement. Findings consistent with discitis/osteomyelitis. As before, there is fluid  within the bilateral L4-L5 facet joints (greater on the right) and septic arthritis is difficult to exclude (series 11, image 29). More conspicuous than on prior examination, there is abnormal T2 hyperintensity and enhancement within the L2 and L3 vertebrae along the disc space. Findings highly suspicious for an additional level of discitis/osteomyelitis. Conus medullaris and cauda equina: Conus extends to the L2 level. No signal abnormality within the visualized distal spinal cord. New from prior MRI, there is ventral epidural phlegmon at the L4-L5 level measuring 0.6 cm in AP dimension (series 10, image 7). Paraspinal and other soft tissues: There is nonspecific soft tissue edema and  enhancement within the lumbar paraspinal soft tissues, greatest at L4-L5 level. More conspicuous than on prior examination. There is abnormal paraspinal enhancing soft tissue consistent with phlegmon at the L4-L5 level extending to involve both psoas muscles. This extends into the bilateral L4-L5 neural foramina and partially into the bilateral L5-S1 neural foramina. Again demonstrated are bilateral psoas abscesses, the largest on the right measuring 2.7 x 2.4 x 4.4 cm (series 6, image 33) (series 9, image 13). Disc levels: T12-L1: No disc herniation. No significant canal or foraminal stenosis. L1-L2: No disc herniation. No significant canal or foraminal stenosis. L2-L3: Disc bulge with endplate spurring. New from prior examination there is a left center/subarticular caudally migrated disc extrusion. Mild facet arthrosis. The disc extrusion contributes to significant left subarticular stenosis with encroachment upon the descending left L3 nerve root. No significant central canal stenosis or neural foraminal narrowing. L3-L4: Disc bulge asymmetric to the left with associated endplate spurring. Mild facet arthrosis. Mild left subarticular and bilateral neural foraminal narrowing. No significant central canal stenosis. L4-L5: Disc bulge  with endplate remodeling. Ventral epidural phlegmon as described. No significant spinal canal stenosis. As before, abnormal enhancement likely reflecting phlegmon extends into both neural foramina with severe bilateral neural foraminal narrowing. L5-S1: Small disc bulge. Mild facet arthrosis. No significant spinal canal stenosis. Abnormal soft tissue enhancement likely reflecting phlegmon extends into the bilateral neural foramina. IMPRESSION: 1. Redemonstrated findings of L4-L5 discitis/osteomyelitis. Endplate irregularity at this level has progressed. Persistent fluid within the bilateral L4-L5 facet joints (greater on the right). For which septic arthritis is difficult to exclude. New ventral epidural phlegmon at this level measuring 0.6 cm in AP dimension without significant canal stenosis. Persistent extensive paraspinal phlegmon at this level extending into the bilateral L4-L5 neural foramina with resultant severe neural foraminal narrowing. Phlegmon also extends partially into the bilateral L5-S1 neural foramina. 2. Signal abnormality and abnormal enhancement now present at the L2-L3 level also suspicious for discitis/osteomyelitis. 3. Redemonstrated bilateral psoas abscesses, the largest on the right measuring 2.7 x 2.4 x 4.4 cm. 4. New L2-L3 left center/subarticular caudally migrated disc extrusion. Resultant left subarticular stenosis with encroachment upon the descending left L3 nerve root. Electronically Signed   By: Kellie Simmering DO   On: 03/14/2019 13:07   Ct Hip Right Wo Contrast  Result Date: 03/31/2019 CLINICAL DATA:  Severe right hip pain for 5 days. No known injury. Suspected hip fracture. EXAM: CT OF THE RIGHT HIP WITHOUT CONTRAST TECHNIQUE: Multidetector CT imaging of the right hip was performed according to the standard protocol. Multiplanar CT image reconstructions were also generated. COMPARISON:  Pelvic CT 06/14/2006. FINDINGS: Bones/Joint/Cartilage Examination is targeted to the right  hip and inferior right hemipelvis. There is no evidence of acute fracture, dislocation or femoral head avascular necrosis. The bones appear adequately mineralized. There is no significant right hip arthropathy or joint effusion. Ligaments Suboptimally assessed by CT. Muscles and Tendons No focal muscular abnormalities are identified. Soft tissues There is mild generalized soft tissue edema without focal fluid collection, foreign body or soft tissue emphysema. The visualized internal pelvic contents appear unremarkable. IMPRESSION: 1. No evidence of acute right hip fracture or dislocation. 2. Mild generalized soft tissue edema. Electronically Signed   By: Richardean Sale M.D.   On: 03/31/2019 15:16   Dg Chest Port 1 View  Result Date: 03/29/2019 CLINICAL DATA:  Fever, heroin abuse EXAM: PORTABLE CHEST 1 VIEW COMPARISON:  03/16/2019 FINDINGS: Multiple bilateral pulmonary nodules which have improved compared with the prior examination  of 03/16/2019. No new focal consolidation. No pleural effusion. Small residual left apical pneumothorax. Stable cardiomediastinal silhouette. No aggressive osseous lesion. IMPRESSION: 1. Bilateral pulmonary nodules which are improved compared with the prior examination of 03/16/2019. Small residual left apical pneumothorax. Electronically Signed   By: Kathreen Devoid   On: 03/29/2019 15:32   Dg Chest Port 1 View  Result Date: 03/15/2019 CLINICAL DATA:  Hydropneumothorax. EXAM: PORTABLE CHEST 1 VIEW COMPARISON:  March 14, 2019. FINDINGS: The heart size and mediastinal contours are within normal limits. Stable mild left apical pneumothorax is noted. Stable bilateral nodular densities are noted some of which are cavitary, consistent with septic emboli. The visualized skeletal structures are unremarkable. IMPRESSION: Stable mild left apical pneumothorax is noted. Stable bilateral nodular densities are noted, some of which are cavitary, consistent with septic emboli. Electronically  Signed   By: Marijo Conception M.D.   On: 03/15/2019 13:03   Dg Chest Port 1 View  Result Date: 03/14/2019 CLINICAL DATA:  Chest tube removal EXAM: PORTABLE CHEST 1 VIEW COMPARISON:  03/14/2019, 03/13/2019, 03/10/2019 FINDINGS: Interim removal of left-sided chest tube. Overall no significant interval change in size of small residual left apicolateral pneumothorax. There is probable trace pneumothorax at the left CP angle. Stable cardiomediastinal silhouette. Multiple bilateral nodules and cavitary lesions as before. IMPRESSION: 1. Removal of left chest tube. 2. Overall no significant interval change in size of small residual left apicolateral pneumothorax. Now seen is a small amount of pneumothorax component at the left CP angle. 3. Stable bilateral lung nodules and cavitary lesions. Electronically Signed   By: Donavan Foil M.D.   On: 03/14/2019 16:55   Dg Chest Port 1 View  Result Date: 03/14/2019 CLINICAL DATA:  Hydropneumothorax EXAM: PORTABLE CHEST 1 VIEW COMPARISON:  03/13/2019 FINDINGS: No significant change in AP portable examination with a persistent small left lateral and apical pneumothorax component and a left-sided pigtail chest tube in position about the lateral lung base. Probable small persistent effusion component. No change in multiple masses and nodules bilaterally, some of which are cavitary. The heart and mediastinum are unremarkable. IMPRESSION: 1. No significant change in AP portable examination with a persistent small left lateral and apical pneumothorax component and a left-sided pigtail chest tube in position about the lateral lung base. Probable small persistent effusion component. 2. No change in multiple masses and nodules bilaterally, some of which are cavitary. Electronically Signed   By: Eddie Candle M.D.   On: 03/14/2019 09:58   Dg Chest Port 1 View  Result Date: 03/13/2019 CLINICAL DATA:  48 year male with shortness of breath and left-sided pneumothorax.  History of septic emboli. EXAM: PORTABLE CHEST 1 VIEW COMPARISON:  Chest radiograph dated 03/10/2019. FINDINGS: Left-sided chest tube in similar position. Overall slight interval increase in the size of the left pneumothorax since the radiograph of 03/10/2019. Continued follow-up recommended. No mediastinal shift. Bilateral nodular and confluent airspace densities similar to prior radiograph. There is a small left pleural effusion. Stable cardiac silhouette. No acute osseous pathology. IMPRESSION: 1. Slight interval increase in the size of the left pneumothorax since the radiograph of 03/10/19. Continued follow-up recommended. 2. Bilateral nodular and confluent airspace densities similar to prior radiograph. Electronically Signed   By: Anner Crete M.D.   On: 03/13/2019 12:26   Dg Chest Port 1 View  Result Date: 03/07/2019 CLINICAL DATA:  Left chest tube in place for pneumothorax. EXAM: PORTABLE CHEST 1 VIEW COMPARISON:  Single-view of the chest 03/04/2019 03/03/2019. CT chest 02/28/2019.  History of septic emboli. FINDINGS: Pigtail catheter remains in place in the lower left chest. Left pneumothorax seen on the most recent examination has slightly increased in size. Multiple cavitary nodules are again seen bilaterally. No right pneumothorax. Heart size is normal. IMPRESSION: Small left pneumothorax seen on the most recent examination has mildly increased in size. Left chest tube remains in place. Multiple cavitary nodules bilaterally consistent with septic emboli. Electronically Signed   By: Inge Rise M.D.   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 Patient presents today for CT-guided aspiration and/or drainage catheter placement. No, patient previously underwent CT-guided paraspinal aspiration on 02/22/2019. EXAM: CT IMAGE GUIDED DRAINAGE BY  PERCUTANEOUS CATHETER COMPARISON:  Lumbar spine MRI-03/14/2019; CT-guided paraspinal abscess aspiration-02/22/2019 MEDICATIONS: The patient is currently admitted to the hospital and receiving intravenous antibiotics. The antibiotics were administered within an appropriate time frame prior to the initiation of the procedure. ANESTHESIA/SEDATION: Moderate (conscious) sedation was employed during this procedure. A total of Benadryl 25 mg IV; versed 3 mg and Fentanyl 100 mcg was administered intravenously. Moderate Sedation Time: 14 minutes. The patient's level of consciousness and vital signs were monitored continuously by radiology nursing throughout the procedure under my direct supervision. CONTRAST:  None COMPLICATIONS: None immediate. PROCEDURE: Informed written consent was obtained from the patient after a discussion of the risks, benefits and alternatives to treatment. The patient was placed prone on the CT gantry and a pre procedural CT was performed re-demonstrating the known abscess/fluid collection within the inferior aspect the right iliopsoas musculature with dominant component measuring approximately 2.6 x 2.4 cm (25, series 2) the procedure was planned. A timeout was performed prior to the initiation of the procedure. The skin overlying the posterior inferior back was prepped and draped in the usual sterile fashion. The overlying soft tissues were anesthetized with 1% lidocaine with epinephrine. Appropriate trajectory was planned with the use of a 22 gauge spinal needle. An 18 gauge trocar needle was advanced into the abscess/fluid collection and a short Amplatz super stiff wire was coiled within the collection. Appropriate positioning was confirmed with a limited CT scan. The tract was serially dilated allowing placement of a 10 [place] all-purpose drainage catheter. Appropriate positioning was confirmed with a limited postprocedural CT scan. Approximately 15 ml of purulent fluid was aspirated. The tube  was connected to a JP bulb and sutured in place. A dressing was placed. The patient tolerated the procedure well without immediate post procedural complication. IMPRESSION: Successful CT guided placement of a 10 [place] all purpose drain catheter into the recurrent right paraspinal abscess with aspiration of 15 mL of purulent fluid. Samples were sent to the laboratory as requested by the ordering clinical team. PLAN: - Flush the percutaneous drainage catheter with 10 cc of normal saline b.i.d. - Record daily output from the percutaneous drainage catheter. - once drainage catheter output is less than 10 cc per day (excluding flush), repeat contrast-enhanced CT scan of the abdomen and pelvis is recommended to ensure abscess resolution. Ultimately, the percutaneous drainage catheter may be removed once the abscess has resolved on CT imaging, not requiring dedicated percutaneous drainage catheter injection given lack clinical concern for an enteric fistula. Electronically Signed   By: Sandi Mariscal M.D.   On: 03/15/2019 17:06   Vas [place] Upper Extremity Venous Duplex  Result Date: 03/07/2019 UPPER VENOUS STUDY  Indications: Pain Comparison Study: no prior  Performing Technologist: Abram Sander RVS  Examination Guidelines: A complete evaluation includes B-mode imaging, spectral Doppler, color Doppler, and power Doppler as needed of all accessible portions of each vessel. Bilateral testing is considered an integral part of a complete examination. Limited examinations for reoccurring indications may be performed as noted.  Right Findings: +----------+------------+---------+-----------+----------+-------+  RIGHT      Compressible Phasicity Spontaneous Properties Summary  +----------+------------+---------+-----------+----------+-------+  Subclavian                 Yes        Yes                         +----------+------------+---------+-----------+----------+-------+  Left Findings:  +----------+------------+---------+-----------+----------+-------+  LEFT       Compressible Phasicity Spontaneous Properties Summary  +----------+------------+---------+-----------+----------+-------+  IJV            Full        Yes        Yes                         +----------+------------+---------+-----------+----------+-------+  Subclavian     Full        Yes        Yes                         +----------+------------+---------+-----------+----------+-------+  Axillary       Full        Yes        Yes                         +----------+------------+---------+-----------+----------+-------+  Brachial       Full        Yes        Yes                         +----------+------------+---------+-----------+----------+-------+  Radial         Full                                               +----------+------------+---------+-----------+----------+-------+  Ulnar          Full                                               +----------+------------+---------+-----------+----------+-------+  Cephalic       Full                                               +----------+------------+---------+-----------+----------+-------+  Basilic        Full                                               +----------+------------+---------+-----------+----------+-------+  Summary:  Right: No evidence of thrombosis in the subclavian.  Left: No evidence of deep vein thrombosis in the upper extremity. No evidence of  superficial vein thrombosis in the upper extremity. No evidence of thrombosis in the subclavian.  *See table(s) above for measurements and observations.  Diagnosing physician: Harold Barban MD Electronically signed by Harold Barban MD on 03/07/2019 at 5:52:52 PM.    Final     Lab Data:  CBC: Recent Labs  Lab 04/03/19 0855 04/05/19 0850  WBC 7.0 6.4  HGB 10.2* 9.9*  HCT 33.4* 31.7*  MCV 96.0 97.2  PLT 385 543   Basic Metabolic Panel: Recent Labs  Lab 03/31/19 1020 04/03/19 0855 04/05/19 0850  NA 137 138  138  K 3.7 4.1 4.4  CL 100 98 101  CO2 _0 GLUCOSE 166* 96 93  BUN 18 14 28*  CREATININE 0.51* 0.39* 0.49*  CALCIUM 8.5* 9.5 9.2   GFR: Estimated Creatinine Clearance: 78.8 mL/min (A) (by C-G formula based on SCr of 0.49 mg/dL (L)). Liver Function Tests: No results for input(s): AST, ALT, ALKPHOS, BILITOT, PROT, ALBUMIN in the last 168 hours. No results for input(s): LIPASE, AMYLASE in the last 168 hours. No results for input(s): AMMONIA in the last 168 hours. Coagulation Profile: No results for input(s): INR, PROTIME in the last 168 hours. Cardiac Enzymes: No results for input(s): CKTOTAL, CKMB, CKMBINDEX, TROPONINI in the last 168 hours. BNP (last 3 results) No results for input(s): PROBNP in the last 8760 hours. HbA1C: No results for input(s): HGBA1C in the last 72 hours. CBG: Recent Labs  Lab 03/31/19 0748 03/31/19 1131 03/31/19 1622 03/31/19 2233 04/01/19 0737  GLUCAP 96 109* 100* 131* 91   Lipid Profile: No results for input(s): CHOL, HDL, LDLCALC, TRIG, CHOLHDL, LDLDIRECT in the last 72 hours. Thyroid Function Tests: No results for input(s): TSH, T4TOTAL, FREET4, T3FREE, THYROIDAB in the last 72 hours. Anemia Panel: No results for input(s): VITAMINB12, FOLATE, FERRITIN, TIBC, IRON, RETICCTPCT in the last 72 hours. Urine analysis:    Component Value Date/Time   BILIRUBINUR neg 12/21/2017 1551   PROTEINUR Negative 12/21/2017 1551   UROBILINOGEN 0.2 12/21/2017 1551   NITRITE neg 12/21/2017 1551   LEUKOCYTESUR Negative 12/21/2017 1551     Alma Friendly M.D. [facility] 04/05/2019, 5:45 PM

## 2019-04-06 LAB — BASIC METABOLIC PANEL
Anion gap: 9 (ref 5–15)
BUN: 20 mg/dL (ref 6–20)
CO2: 26 mmol/L (ref 22–32)
Calcium: 9 mg/dL (ref 8.9–10.3)
Chloride: 103 mmol/L (ref 98–111)
Creatinine, Ser: 0.42 mg/dL — ABNORMAL LOW (ref 0.61–1.24)
GFR calc Af Amer: >60 mL/min (ref >60)
GFR calc non Af Amer: >60 mL/min (ref >60)
Glucose, Bld: 86 mg/dL (ref 70–99)
Potassium: 4.3 mmol/L (ref 3.5–5.1)
Sodium: 138 mmol/L (ref 135–145)

## 2019-04-06 MED ORDER — AMLODIPINE BESYLATE 10 MG PO TABS
10.0000 mg | ORAL_TABLET | Freq: Every day | ORAL | Status: DC
  Administered 2019-04-06 – 2019-04-14 (×9): 10 mg via ORAL
  Filled 2019-04-06 (×9): qty 1

## 2019-04-06 NOTE — Progress Notes (Signed)
[facility]                                                                              Patient Demographics  Franklin Anderson, is a 48 y.o. male, DOB - 1970-08-30, PQD:[id number]  Admit date - 02/21/2019   Admitting Physician Etta Quill, DO  Outpatient Primary MD for the patient is Mack Hook, MD  Outpatient specialists:   LOS - 43  days   Medical records reviewed and are as summarized below:    Chief Complaint  Patient presents with  . Back Pain  . Heroin Detox  . Weight Loss       Brief summary   Franklin Anderson 47 y.o.malewith medical history significant of rheumatic fever, HTN, ongoingIV heroin abuse, nicotine abuse, presented to the ED on 10/27 with 3 week course of progressively worsening back pain and BLE weakness associated with some bowel incontinence and trouble walking. Last use of heroin was on the day of admission. In ED > Found to have sepsis with Tm 100, HR 126, RR 31, WBC 22.4k,AKI with BUN 111 and creat 1.7. Imaging studies revealed osteomyelitis and diskitis of L4-L5,septic pulmonary emboli and psoas abscesses. He was started on IV antibiotics and admitted to [facility]. On 10/28 was found to be obtunded and dyspneic and was intubated. Blood cultures from admission grew out MSSA, subsequently found to have RV vegetation,  left sided empyema (MSSA) and  a left pneumothorax for which he received a chest tube.  He also received a drain in his psoas abscess.  Both chest tube and drain have been removed and ID has signed off with instructions to continue IV antibiotics until 12/16. Patient on multiple pain meds due to high pain threshold (IV drug abuse)   Assessment & Plan   Sepsis with MSSA bacteremia, L4-L5 osteomyelitis and discitis, epidural abscess, psoas abscess, septic pulmonary emboli, empyema and right ventricular mural vegetation Currently afebrile, with no leukocytosis Right psoas abscess was aspirated on 10/28, culture positive  for MSSA. Respiratory culture 10/28 also MSSA 2D echo 10/28 showed possible mitral valve vegetation and moderate pericardial effusion Underwent TEE on 11/3 showed medium sized, mobile mass attached to the trabeculation in the RV and moderate sized pericardial effusion Per CT surgery, not a good candidate for thoracotomy MRI on 11/17 showed increasing psoas abscess s/p catheter drainage of right paraspinal abscess by IR on 11/18 ID recommended continue IV Ancef through 12/16 followed by Keflex for 30 days Continue IV Ancef Pain management consists of Toradol, fentanyl patch, Flexeril, oral Dilaudid as needed and oral oxycodone prn for breakthrough pain. Monitor for narcotic seeking behavior  Acute respiratory failure with hypoxemia secondary to MSSA infection, septic pulmonary emboli, left empyema, left bronchopleural fistula (hydropneumothorax) Resolved, currently on RA Patient was intubated on 10/28-10/31 Underwent bronchoscopy with BAL, s/p left chest tube, removed on 11/17 Chest x-ray 12/2 showed bilateral pulmonary nodules, which are improved compared to prior exam on 11/19.  Small residual left apical pneumothorax   Moderate pericardial effusion Seen on 2D echo and TEE, treated conservatively and improving Last echo on 11/9 showed mild effusion (improvement)  Essential hypertension BP  stable Continue Norvasc, metoprolol  Severe protein calorie malnutrition Continue dietary supplements  HCV positive Per ID, seems to have cleared  Pressure injury Sacrum with stage I, Right hip deep tissue injury Wound care per nursing   Code Status: Full code DVT Prophylaxis:  Lovenox  Family Communication: None at bedside Disposition Plan: Remains inpatient till completes IV antibiotics, completes on 12/16   Procedures:  TEE Intubation   Consultants:   PCCM, cardiology, ID, palliative care, cardiothoracic surgery  Antimicrobials:   Anti-infectives (From admission, onward)     Start     Dose/Rate Route Frequency Ordered Stop   02/24/19 2200  vancomycin (VANCOCIN) IVPB 1000 mg/200 mL premix  Status:  Discontinued     1,000 mg 200 mL/hr over 60 Minutes Intravenous Every 12 hours 02/24/19 1010 02/24/19 1253   02/24/19 1600  ceFAZolin (ANCEF) IVPB 2g/100 mL premix     2 g 200 mL/hr over 30 Minutes Intravenous Every 8 hours 02/24/19 1253 04/12/19 2359   02/23/19 2200  vancomycin (VANCOCIN) IVPB 750 mg/150 ml premix  Status:  Discontinued     750 mg 150 mL/hr over 60 Minutes Intravenous Every 24 hours 02/23/19 0853 02/23/19 0909   02/23/19 2200  vancomycin (VANCOCIN) IVPB 750 mg/150 ml premix  Status:  Discontinued     750 mg 150 mL/hr over 60 Minutes Intravenous Every 12 hours 02/23/19 0909 02/24/19 1010   02/22/19 2200  vancomycin (VANCOCIN) IVPB 750 mg/150 ml premix  Status:  Discontinued     750 mg 150 mL/hr over 60 Minutes Intravenous Every 24 hours 02/22/19 0826 02/22/19 0827   02/22/19 2200  vancomycin (VANCOCIN) IVPB 1000 mg/200 mL premix  Status:  Discontinued     1,000 mg 200 mL/hr over 60 Minutes Intravenous Every 24 hours 02/22/19 0827 02/23/19 0853   02/22/19 0900  ceFEPIme (MAXIPIME) 2 g in sodium chloride 0.9 % 100 mL IVPB  Status:  Discontinued     2 g 200 mL/hr over 30 Minutes Intravenous Every 12 hours 02/22/19 0826 02/23/19 1135   02/21/19 2015  ceFEPIme (MAXIPIME) 2 g in sodium chloride 0.9 % 100 mL IVPB     2 g 200 mL/hr over 30 Minutes Intravenous  Once 02/21/19 2013 02/22/19 0031   02/21/19 2015  metroNIDAZOLE (FLAGYL) IVPB 500 mg     500 mg 100 mL/hr over 60 Minutes Intravenous  Once 02/21/19 2013 02/22/19 0031   02/21/19 2015  vancomycin (VANCOCIN) IVPB 1000 mg/200 mL premix     1,000 mg 200 mL/hr over 60 Minutes Intravenous  Once 02/21/19 2013 02/22/19 0200         Medications  Scheduled Meds: . acetaminophen  1,000 mg Oral TID  . amLODipine  10 mg Oral Daily  . cyclobenzaprine  10 mg Oral TID  . enoxaparin (LOVENOX)  injection  40 mg Subcutaneous Q24H  . feeding supplement (ENSURE ENLIVE)  237 mL Oral BID BM  . fentaNYL  1 patch Transdermal Q72H  . ketorolac  15 mg Intravenous Q6H  . lidocaine  1 patch Transdermal Q24H  . mouth rinse  15 mL Mouth Rinse BID  . metoprolol tartrate  100 mg Oral BID  . multivitamin with minerals  1 tablet Oral Daily  . pantoprazole  40 mg Oral Daily  . polyethylene glycol  17 g Oral Daily   Continuous Infusions: . sodium chloride 1,000 mL (04/03/19 2141)  .  ceFAZolin (ANCEF) IV 2 g (04/06/19 1319)   PRN Meds:.sodium chloride, acetaminophen **OR** acetaminophen, clonazePAM,  hydrALAZINE, HYDROmorphone, ipratropium-albuterol, ondansetron **OR** ondansetron (ZOFRAN) IV, oxyCODONE, senna-docusate      Subjective:  Patient denies any new complaints.  Reports pain regimen is working well for him.  Denies any chest pain, shortness of breath, abdominal pain, fever/chills.  Objective:   Vitals:   04/05/19 0455 04/05/19 1432 04/05/19 2100 04/06/19 0502  BP: 127/90 (!) 142/99 (!) 145/100 (!) 136/99  Pulse: 77 (!) 105 (!) 109 79  Resp: _0 Temp: (!) 97.4 F (36.3 C) 98 F (36.7 C) 98.2 F (36.8 C) (!) 97.5 F (36.4 C)  TempSrc: Oral  Oral   SpO2: 98% 98% 99% 99%  Weight:      Height:        Intake/Output Summary (Last 24 hours) at 04/06/2019 1452 Last data filed at 04/06/2019 0272 Gross per 24 hour  Intake 240 ml  Output 1450 ml  Net -1210 ml     Wt Readings from Last 3 Encounters:  03/15/19 49.9 kg  12/21/17 65.3 kg  04/22/16 68 kg    Physical Exam  General: NAD   Cardiovascular: S1, S2 present  Respiratory: CTAB  Abdomen: Soft, nontender, nondistended, bowel sounds present  Musculoskeletal: No bilateral pedal edema noted  Skin: Normal  Psychiatry: Normal mood    Data Reviewed:  I have personally reviewed following labs and imaging studies  Micro Results No results found for this or any previous visit (from the past 240  hour(s)).  Radiology Reports DG Chest 1 View  Result Date: 03/16/2019 CLINICAL DATA:  Pleural effusion. EXAM: CHEST  1 VIEW COMPARISON:  March 15, 2019. FINDINGS: The heart size and mediastinal contours are within normal limits. Stable mild left apical and basilar pneumothorax is noted. Stable bilateral nodular densities are noted, some of which are cavitary, consistent with septic emboli. The visualized skeletal structures are unremarkable. IMPRESSION: Stable mild left pneumothorax. Stable bilateral nodular densities are again noted consistent with septic emboli. Electronically Signed   By: Marijo Conception M.D.   On: 03/16/2019 13:49   DG Chest 2 View  Result Date: 03/10/2019 CLINICAL DATA:  Chest tube, left pleural effusion EXAM: CHEST - 2 VIEW COMPARISON:  03/08/2019 FINDINGS: Left-sided pigtail chest tube remains in position about the left lung base, with a small, persistent left apical pneumothorax, not significantly changed compared to prior examination. Small, persistent pleural effusion component about the left lung base. Multiple pulmonary nodules are again noted bilaterally, some of which are cavitary. The heart and mediastinum are unremarkable. IMPRESSION: 1. Left-sided pigtail chest tube remains in position about the left lung base, with a small, persistent left apical pneumothorax, not significantly changed compared to prior examination. Small, persistent pleural effusion component about the left lung base. 2. Multiple pulmonary nodules are again noted bilaterally, some of which are cavitary. Electronically Signed   By: Eddie Candle M.D.   On: 03/10/2019 13:48   CT CHEST W CONTRAST  Result Date: 03/08/2019 CLINICAL DATA:  Septic pulmonary emboli; history rheumatic fever, hypertension, drug abuse EXAM: CT CHEST WITH CONTRAST TECHNIQUE: Multidetector CT imaging of the chest was performed during intravenous contrast administration. Sagittal and coronal MPR images reconstructed from axial  data set. CONTRAST:  92m OMNIPAQUE IOHEXOL 300 MG/ML  SOLN IV COMPARISON:  02/28/2019 FINDINGS: Cardiovascular: Vascular structures patent. No pericardial effusion. Heart normal size. Pulmonary arteries grossly unremarkable for phase of imaging. Mediastinum/Nodes: Base of cervical region normal appearance. Patient cachectic. No definite adenopathy. Lungs/Pleura: Multiple masses are identified throughout both lungs, many cavitary,  consistent with septic emboli. Largest RIGHT lung lesion 2.7 x 2.7 cm RIGHT middle lobe image 123. Largest LEFT lung lesion in lingula 4.7 x 4.5 cm image 101. Dependent atelectasis in the posterior lungs bilaterally. Interval decrease in size of significant LEFT pneumothorax component. Partially loculated LEFT pleural effusion with air-fluid level. Upper Abdomen: Small cyst upper LEFT kidney. Remaining visualized upper abdomen unremarkable. Musculoskeletal: No acute osseous lesions. IMPRESSION: Persistent loculated LEFT hydropneumothorax, with decrease in pneumothorax component since previous exam. Septic emboli throughout both lungs, little changed. Electronically Signed   By: Lavonia Dana M.D.   On: 03/08/2019 10:37   CT LUMBAR SPINE W CONTRAST  Result Date: 03/20/2019 CLINICAL DATA:  Abdominal pain and fever. Abscess. At the drug abuse. Progressively worse back pain and bilateral lower extremity weakness. EXAM: CT LUMBAR SPINE WITH CONTRAST TECHNIQUE: Multidetector CT imaging of the lumbar spine was performed with intravenous contrast administration. CONTRAST:  110m OMNIPAQUE IOHEXOL 300 MG/ML  SOLN COMPARISON:  MRI of the lumbar spine 03/14/2019. CT guided drain placement 03/15/2019 FINDINGS: Segmentation: 5 non rib-bearing lumbar type vertebral bodies are present. The lowest fully formed vertebral body is L5. Alignment: No significant listhesis is present. There is straightening of the normal lumbar lordosis. Rightward curvature is present at the thoracolumbar junction.  Compensatory leftward curvature is present at L5-S1. Vertebrae: Lytic changes are noted at the endplates of LO5-9with some collapse on right. There is no progression collapse. Vertebral body heights are otherwise maintained. No other focal lytic or blastic lesions are present. Paraspinal and other soft tissues: Right paraspinal drain is in place. The largest collection collapsed. A more posterior and slightly superior peripherally enhancing collection measures 12 mm. No significant adenopathy is present. Atherosclerotic changes are noted in the aorta without aneurysm. Disc levels: L1-2: Negative. L2-3: No significant erosive changes are present. Leftward disc protrusion is noted. L3-4: A leftward disc protrusion present. Mild facet hypertrophy is noted bilaterally. L4-5: A broad-based disc protrusion is associated with the infected disc. Moderate foraminal stenosis is again seen bilaterally. L5-S1: Mild disc bulging is present. Facet hypertrophy is worse on the right. IMPRESSION: 1. The right paraspinous scratched at the larger of the 2 right paraspinous fluid collections is drained. 2. The smaller, more superior posterior collection remains, measuring up to 12 mm. 3. Stable lytic endplate changes associated with disc osteomyelitis at L4-5. Collapse is worse on the right, stable from prior exam. 4. Subtle endplate changes posteriorly at L2-3 and at the right facet of L5-S1 without progression. 5.  Aortic Atherosclerosis (ICD10-I70.0). Electronically Signed   By: CSan MorelleM.D.   On: 03/20/2019 15:41   MR Lumbar Spine W Wo Contrast  Result Date: 03/27/2019 CLINICAL DATA:  Back pain, infection. EXAM: MRI LUMBAR SPINE WITHOUT AND WITH CONTRAST TECHNIQUE: Multiplanar and multiecho pulse sequences of the lumbar spine were obtained without and with intravenous contrast. CONTRAST:  558mGADAVIST GADOBUTROL 1 MMOL/ML IV SOLN COMPARISON:  CT scan 03/20/2019 MRI from 03/14/2019 FINDINGS: Segmentation: The  lowest lumbar type non-rib-bearing vertebra is labeled as L5. Alignment:  No vertebral subluxation is observed. Vertebrae: Again observed discitis osteomyelitis at the L4-5 level with endplate edema and sclerosis, and irregular rim enhancing collections along the anterior intervertebral disc space. There is bilateral facet edema and enhancement at this level, and septic arthritis of the facet joints is not excluded given the appearance. Endplate enhancement and type 1 degenerative endplate findings at L2Y9-2with subtle accentuated T2 signal in the L2-3 intervertebral disc, a second site  of early discitis is likely. Stable 6 mm focus of T2 hyperintensity in the T12 vertebral body, probably a small hemangioma or similar benign lesion. Left eccentric type 1 degenerative endplate findings at [id number]. Conus medullaris and cauda equina: Conus extends to the L1-2. Paraspinal and other soft tissues: Small abscesses are present along the right psoas muscle with surrounding myositis. This includes a 0.9 by 1.0 by 2.0 cm abscess on image 34/6 anteriorly, and a 0.9 cm posterior right psoas abscess on image 30/6. Paraspinal phlegmon is most prominent at the L4-5 level and surrounds the vertebral level, and also at the L2-3 level and adjacent levels up to T12. The previous enhancing epidural phlegmon at the L4-5 level is less striking than on the prior exam. There is abnormal enhancement extending in the L4-5 and to a lesser extent L5-S1 neural foramina bilaterally. I do not see a well-defined epidural abscess. Disc levels: T12-L1: Unremarkable. L1-2: No impingement. L2-3: Borderline left subarticular lateral recess stenosis due to left eccentric intervertebral spurring and facet spurring. L3-4: Mild left subarticular lateral recess stenosis and mild left foraminal stenosis due to left lateral recess and foraminal disc protrusion, disc bulge, and mild facet arthropathy. L4-5: At least mild bilateral foraminal stenosis due to facet  and intervertebral spurring. L5-S1: Borderline bilateral foraminal stenosis due to disc bulge, intervertebral spurring, and facet arthropathy. IMPRESSION: 1. Active discitis-osteomyelitis at L4-5 and L2-3, with small right psoas muscle abscesses (reduced in size from prior exam) and considerable paraspinal phlegmon tracking between T12 L5. The amount of anterior epidural phlegmon at the L4-5 level is improved, but there is continued abnormal enhancement in both neural foramina at L4-5 and L5-S1. In addition, the facet joints at L4-5 demonstrate severe arthropathy and septic facet joints are not excluded. 2. Lumbar spondylosis and degenerative disc disease with mild impingement at L3-4 and L4-5. Electronically Signed   By: Van Clines M.D.   On: 03/27/2019 18:48   MR Lumbar Spine W Wo Contrast  Result Date: 03/14/2019 CLINICAL DATA:  Back pain, infection suspected. Patient with disseminated MSSA infection multiple abscesses. Worsening lower back pain not responding to pain management. Follow-up known osteomyelitis in spine. EXAM: MRI LUMBAR SPINE WITHOUT AND WITH CONTRAST TECHNIQUE: Multiplanar and multiecho pulse sequences of the lumbar spine were obtained without and with intravenous contrast. CONTRAST:  69m GADAVIST GADOBUTROL 1 MMOL/ML IV SOLN COMPARISON:  Lumbar spine MRI 02/21/2019 FINDINGS: Segmentation: For the purposes of this dictation, five lumbar vertebrae are assumed and the caudal most well-formed intervertebral disc is designated L5-S1. Alignment: Straightening of the expected lumbar lordosis. No significant spondylolisthesis. Lumbar levocurvature. Vertebrae: Vertebral body height is maintained. Endplate irregularity at L4-L5 has increased as compared to prior examination. Again demonstrated there is extensive abnormal T2 hyperintense signal throughout the L4 and L5 vertebrae extending to the disc space with associated abnormal enhancement. Findings consistent with discitis/osteomyelitis.  As before, there is fluid within the bilateral L4-L5 facet joints (greater on the right) and septic arthritis is difficult to exclude (series 11, image 29). More conspicuous than on prior examination, there is abnormal T2 hyperintensity and enhancement within the L2 and L3 vertebrae along the disc space. Findings highly suspicious for an additional level of discitis/osteomyelitis. Conus medullaris and cauda equina: Conus extends to the L2 level. No signal abnormality within the visualized distal spinal cord. New from prior MRI, there is ventral epidural phlegmon at the L4-L5 level measuring 0.6 cm in AP dimension (series 10, image 7). Paraspinal and other soft tissues: There is  nonspecific soft tissue edema and enhancement within the lumbar paraspinal soft tissues, greatest at L4-L5 level. More conspicuous than on prior examination. There is abnormal paraspinal enhancing soft tissue consistent with phlegmon at the L4-L5 level extending to involve both psoas muscles. This extends into the bilateral L4-L5 neural foramina and partially into the bilateral L5-S1 neural foramina. Again demonstrated are bilateral psoas abscesses, the largest on the right measuring 2.7 x 2.4 x 4.4 cm (series 6, image 33) (series 9, image 13). Disc levels: T12-L1: No disc herniation. No significant canal or foraminal stenosis. L1-L2: No disc herniation. No significant canal or foraminal stenosis. L2-L3: Disc bulge with endplate spurring. New from prior examination there is a left center/subarticular caudally migrated disc extrusion. Mild facet arthrosis. The disc extrusion contributes to significant left subarticular stenosis with encroachment upon the descending left L3 nerve root. No significant central canal stenosis or neural foraminal narrowing. L3-L4: Disc bulge asymmetric to the left with associated endplate spurring. Mild facet arthrosis. Mild left subarticular and bilateral neural foraminal narrowing. No significant central canal  stenosis. L4-L5: Disc bulge with endplate remodeling. Ventral epidural phlegmon as described. No significant spinal canal stenosis. As before, abnormal enhancement likely reflecting phlegmon extends into both neural foramina with severe bilateral neural foraminal narrowing. L5-S1: Small disc bulge. Mild facet arthrosis. No significant spinal canal stenosis. Abnormal soft tissue enhancement likely reflecting phlegmon extends into the bilateral neural foramina. IMPRESSION: 1. Redemonstrated findings of L4-L5 discitis/osteomyelitis. Endplate irregularity at this level has progressed. Persistent fluid within the bilateral L4-L5 facet joints (greater on the right). For which septic arthritis is difficult to exclude. New ventral epidural phlegmon at this level measuring 0.6 cm in AP dimension without significant canal stenosis. Persistent extensive paraspinal phlegmon at this level extending into the bilateral L4-L5 neural foramina with resultant severe neural foraminal narrowing. Phlegmon also extends partially into the bilateral L5-S1 neural foramina. 2. Signal abnormality and abnormal enhancement now present at the L2-L3 level also suspicious for discitis/osteomyelitis. 3. Redemonstrated bilateral psoas abscesses, the largest on the right measuring 2.7 x 2.4 x 4.4 cm. 4. New L2-L3 left center/subarticular caudally migrated disc extrusion. Resultant left subarticular stenosis with encroachment upon the descending left L3 nerve root. Electronically Signed   By: Kellie Simmering DO   On: 03/14/2019 13:07   CT HIP RIGHT WO CONTRAST  Result Date: 03/31/2019 CLINICAL DATA:  Severe right hip pain for 5 days. No known injury. Suspected hip fracture. EXAM: CT OF THE RIGHT HIP WITHOUT CONTRAST TECHNIQUE: Multidetector CT imaging of the right hip was performed according to the standard protocol. Multiplanar CT image reconstructions were also generated. COMPARISON:  Pelvic CT 06/14/2006. FINDINGS: Bones/Joint/Cartilage Examination  is targeted to the right hip and inferior right hemipelvis. There is no evidence of acute fracture, dislocation or femoral head avascular necrosis. The bones appear adequately mineralized. There is no significant right hip arthropathy or joint effusion. Ligaments Suboptimally assessed by CT. Muscles and Tendons No focal muscular abnormalities are identified. Soft tissues There is mild generalized soft tissue edema without focal fluid collection, foreign body or soft tissue emphysema. The visualized internal pelvic contents appear unremarkable. IMPRESSION: 1. No evidence of acute right hip fracture or dislocation. 2. Mild generalized soft tissue edema. Electronically Signed   By: Richardean Sale M.D.   On: 03/31/2019 15:16   DG CHEST PORT 1 VIEW  Result Date: 03/29/2019 CLINICAL DATA:  Fever, heroin abuse EXAM: PORTABLE CHEST 1 VIEW COMPARISON:  03/16/2019 FINDINGS: Multiple bilateral pulmonary nodules which have improved  compared with the prior examination of 03/16/2019. No new focal consolidation. No pleural effusion. Small residual left apical pneumothorax. Stable cardiomediastinal silhouette. No aggressive osseous lesion. IMPRESSION: 1. Bilateral pulmonary nodules which are improved compared with the prior examination of 03/16/2019. Small residual left apical pneumothorax. Electronically Signed   By: Kathreen Devoid   On: 03/29/2019 15:32   DG CHEST PORT 1 VIEW  Result Date: 03/15/2019 CLINICAL DATA:  Hydropneumothorax. EXAM: PORTABLE CHEST 1 VIEW COMPARISON:  March 14, 2019. FINDINGS: The heart size and mediastinal contours are within normal limits. Stable mild left apical pneumothorax is noted. Stable bilateral nodular densities are noted some of which are cavitary, consistent with septic emboli. The visualized skeletal structures are unremarkable. IMPRESSION: Stable mild left apical pneumothorax is noted. Stable bilateral nodular densities are noted, some of which are cavitary, consistent with septic  emboli. Electronically Signed   By: Marijo Conception M.D.   On: 03/15/2019 13:03   DG CHEST PORT 1 VIEW  Result Date: 03/14/2019 CLINICAL DATA:  Chest tube removal EXAM: PORTABLE CHEST 1 VIEW COMPARISON:  03/14/2019, 03/13/2019, 03/10/2019 FINDINGS: Interim removal of left-sided chest tube. Overall no significant interval change in size of small residual left apicolateral pneumothorax. There is probable trace pneumothorax at the left CP angle. Stable cardiomediastinal silhouette. Multiple bilateral nodules and cavitary lesions as before. IMPRESSION: 1. Removal of left chest tube. 2. Overall no significant interval change in size of small residual left apicolateral pneumothorax. Now seen is a small amount of pneumothorax component at the left CP angle. 3. Stable bilateral lung nodules and cavitary lesions. Electronically Signed   By: Donavan Foil M.D.   On: 03/14/2019 16:55   DG CHEST PORT 1 VIEW  Result Date: 03/14/2019 CLINICAL DATA:  Hydropneumothorax EXAM: PORTABLE CHEST 1 VIEW COMPARISON:  03/13/2019 FINDINGS: No significant change in AP portable examination with a persistent small left lateral and apical pneumothorax component and a left-sided pigtail chest tube in position about the lateral lung base. Probable small persistent effusion component. No change in multiple masses and nodules bilaterally, some of which are cavitary. The heart and mediastinum are unremarkable. IMPRESSION: 1. No significant change in AP portable examination with a persistent small left lateral and apical pneumothorax component and a left-sided pigtail chest tube in position about the lateral lung base. Probable small persistent effusion component. 2. No change in multiple masses and nodules bilaterally, some of which are cavitary. Electronically Signed   By: Eddie Candle M.D.   On: 03/14/2019 09:58   DG CHEST PORT 1 VIEW  Result Date: 03/13/2019 CLINICAL DATA:  48 year male with shortness of breath and  left-sided pneumothorax. History of septic emboli. EXAM: PORTABLE CHEST 1 VIEW COMPARISON:  Chest radiograph dated 03/10/2019. FINDINGS: Left-sided chest tube in similar position. Overall slight interval increase in the size of the left pneumothorax since the radiograph of 03/10/2019. Continued follow-up recommended. No mediastinal shift. Bilateral nodular and confluent airspace densities similar to prior radiograph. There is a small left pleural effusion. Stable cardiac silhouette. No acute osseous pathology. IMPRESSION: 1. Slight interval increase in the size of the left pneumothorax since the radiograph of 03/10/19. Continued follow-up recommended. 2. Bilateral nodular and confluent airspace densities similar to prior radiograph. Electronically Signed   By: Anner Crete M.D.   On: 03/13/2019 12:26   CT IMAGE GUIDED DRAINAGE BY PERCUTANEOUS CATHETER  Result Date: 03/15/2019 INDICATION: History of intravenous drug abuse with recurrent right-sided paraspinal abscess. Patient presents today for CT-guided aspiration and/or drainage  catheter placement. No, patient previously underwent CT-guided paraspinal aspiration on 02/22/2019. EXAM: CT IMAGE GUIDED DRAINAGE BY PERCUTANEOUS CATHETER COMPARISON:  Lumbar spine MRI-03/14/2019; CT-guided paraspinal abscess aspiration-02/22/2019 MEDICATIONS: The patient is currently admitted to the hospital and receiving intravenous antibiotics. The antibiotics were administered within an appropriate time frame prior to the initiation of the procedure. ANESTHESIA/SEDATION: Moderate (conscious) sedation was employed during this procedure. A total of Benadryl 25 mg IV; versed 3 mg and Fentanyl 100 mcg was administered intravenously. Moderate Sedation Time: 14 minutes. The patient's level of consciousness and vital signs were monitored continuously by radiology nursing throughout the procedure under my direct supervision. CONTRAST:  None COMPLICATIONS: None immediate. PROCEDURE:  Informed written consent was obtained from the patient after a discussion of the risks, benefits and alternatives to treatment. The patient was placed prone on the CT gantry and a pre procedural CT was performed re-demonstrating the known abscess/fluid collection within the inferior aspect the right iliopsoas musculature with dominant component measuring approximately 2.6 x 2.4 cm (25, series 2) the procedure was planned. A timeout was performed prior to the initiation of the procedure. The skin overlying the posterior inferior back was prepped and draped in the usual sterile fashion. The overlying soft tissues were anesthetized with 1% lidocaine with epinephrine. Appropriate trajectory was planned with the use of a 22 gauge spinal needle. An 18 gauge trocar needle was advanced into the abscess/fluid collection and a short Amplatz super stiff wire was coiled within the collection. Appropriate positioning was confirmed with a limited CT scan. The tract was serially dilated allowing placement of a 10 [place] all-purpose drainage catheter. Appropriate positioning was confirmed with a limited postprocedural CT scan. Approximately 15 ml of purulent fluid was aspirated. The tube was connected to a JP bulb and sutured in place. A dressing was placed. The patient tolerated the procedure well without immediate post procedural complication. IMPRESSION: Successful CT guided placement of a 10 French all purpose drain catheter into the recurrent right paraspinal abscess with aspiration of 15 mL of purulent fluid. Samples were sent to the laboratory as requested by the ordering clinical team. PLAN: - Flush the percutaneous drainage catheter with 10 cc of normal saline b.i.d. - Record daily output from the percutaneous drainage catheter. - once drainage catheter output is less than 10 cc per day (excluding flush), repeat contrast-enhanced CT scan of the abdomen and pelvis is recommended to ensure abscess resolution. Ultimately, the  percutaneous drainage catheter may be removed once the abscess has resolved on CT imaging, not requiring dedicated percutaneous drainage catheter injection given lack clinical concern for an enteric fistula. Electronically Signed   By: Sandi Mariscal M.D.   On: 03/15/2019 17:06    Lab Data:  CBC: Recent Labs  Lab 04/03/19 0855 04/05/19 0850  WBC 7.0 6.4  HGB 10.2* 9.9*  HCT 33.4* 31.7*  MCV 96.0 97.2  PLT 385 585   Basic Metabolic Panel: Recent Labs  Lab 03/31/19 1020 04/03/19 0855 04/05/19 0850 04/06/19 0630  NA 137 138 138 138  K 3.7 4.1 4.4 4.3  CL 100 98 101 103  CO2 _0 GLUCOSE 166* 96 93 86  BUN 18 14 28* 20  CREATININE 0.51* 0.39* 0.49* 0.42*  CALCIUM 8.5* 9.5 9.2 9.0   GFR: Estimated Creatinine Clearance: 78.8 mL/min (A) (by C-G formula based on SCr of 0.42 mg/dL (L)). Liver Function Tests: No results for input(s): AST, ALT, ALKPHOS, BILITOT, PROT, ALBUMIN in the last 168 hours. No results  for input(s): LIPASE, AMYLASE in the last 168 hours. No results for input(s): AMMONIA in the last 168 hours. Coagulation Profile: No results for input(s): INR, PROTIME in the last 168 hours. Cardiac Enzymes: No results for input(s): CKTOTAL, CKMB, CKMBINDEX, TROPONINI in the last 168 hours. BNP (last 3 results) No results for input(s): PROBNP in the last 8760 hours. HbA1C: No results for input(s): HGBA1C in the last 72 hours. CBG: Recent Labs  Lab 03/31/19 0748 03/31/19 1131 03/31/19 1622 03/31/19 2233 04/01/19 0737  GLUCAP 96 109* 100* 131* 91   Lipid Profile: No results for input(s): CHOL, HDL, LDLCALC, TRIG, CHOLHDL, LDLDIRECT in the last 72 hours. Thyroid Function Tests: No results for input(s): TSH, T4TOTAL, FREET4, T3FREE, THYROIDAB in the last 72 hours. Anemia Panel: No results for input(s): VITAMINB12, FOLATE, FERRITIN, TIBC, IRON, RETICCTPCT in the last 72 hours. Urine analysis:    Component Value Date/Time   BILIRUBINUR neg 12/21/2017 1551    PROTEINUR Negative 12/21/2017 1551   UROBILINOGEN 0.2 12/21/2017 1551   NITRITE neg 12/21/2017 1551   LEUKOCYTESUR Negative 12/21/2017 1551     Alma Friendly M.D. [facility] 04/06/2019, 2:52 PM

## 2019-04-06 NOTE — Progress Notes (Signed)
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  R hip pain too great at this time for standing activities with OT.  Educated on AE for LB adls and performed only at bed level today.  Goals updated  Follow Up Recommendations  SNF    Equipment Recommendations  3 in 1 bedside commode    Recommendations for Other Services      Precautions / Restrictions Precautions Precautions: Fall Precaution Comments: Sepsis - MSSA bacteremia with L4/L5 osteomyelitis and discitis, epidural abscess, psoas abscess, septic pulmonary emboli, empyema and right ventricular mural vegetation Restrictions Other Position/Activity Restrictions: WBAT       Mobility Bed Mobility                  Transfers                      Balance                                           ADL either performed or assessed with clinical judgement   ADL               Lower Body Bathing: Minimal assistance;Bed level;With adaptive equipment       Lower Body Dressing: Moderate assistance;Bed level;With adaptive equipment                 General ADL Comments: Pt in too much pain to work on standing.  Educated on AE and used reacher to don pants (simulated with pillowcase) and sock aide (L foot only due to pain). Pt would benefit from both of these--wide sock aide would work better     [employer]     Praxis      Cognition Arousal/Alertness: Awake/alert Behavior During Therapy: WFL for tasks assessed/performed Overall Cognitive Status: Within Functional Limits for tasks assessed                                          Exercises     Shoulder Instructions       General Comments      Pertinent Vitals/ Pain       Pain Assessment: Faces Faces Pain Scale: Hurts whole lot Pain Location: R hip Pain Descriptors / Indicators: Guarding;Grimacing;Sharp;Shooting;Discomfort Pain Intervention(s): Limited activity within patient's tolerance;Monitored during session;Premedicated before session;Repositioned  Home Living                                          Prior Functioning/Environment              Frequency  Min 2X/week        Progress Toward Goals  OT Goals(current goals can now be found in the care plan section)  Progress towards OT goals: (goals updated)  Acute Rehab OT Goals OT Goal Formulation: With patient  Time For Goal Achievement: 04/20/19 Potential to Achieve Goals: Good ADL Goals Pt Will Perform Lower Body Dressing: with min guard assist;sit to/from stand;with adaptive equipment Pt Will Transfer to Toilet: with min guard assist;ambulating;bedside commode Pt/caregiver will Perform Home Exercise Program: (discontinue) Additional ADL Goal #1: pt will tolerate standing 3 minutes for adls  Plan      Co-evaluation                 AM-PAC OT "6 Clicks" Daily Activity     Outcome Measure   Help from another person eating meals?: None Help from another person taking care of personal grooming?: A Little Help from another person toileting, which includes using toliet, bedpan, or urinal?: A Lot Help from another person bathing (including washing, rinsing, drying)?: A Little Help from another person to put on and taking off regular upper body clothing?: A Little Help from another person to put on and taking off regular lower body clothing?: A Lot 6 Click Score: 17    End of Session    OT Visit Diagnosis: Unsteadiness on feet (R26.81);Muscle weakness (generalized) (M62.81);Pain Pain - Right/Left: Right Pain - part of body:  Shoulder;Hip   Activity Tolerance     Patient Left     Nurse Communication          Time: 0812-0823 OT Time Calculation (min): 11 min  Charges: OT General Charges $OT Visit: 1 Visit OT Treatments $Self Care/Home Management : 8-22 mins  Eudelia Hiltunen S, OTR/L Acute Rehabilitation Services 04/06/2019   Vuong Musa 04/06/2019, 8:29 AM

## 2019-04-06 NOTE — Progress Notes (Signed)
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 Pt stated that he was going to get better and be able to walk out of the hospital. General bed mobility comments: Pt was sitting on EOB when we arrieved. Pt was able to lift B LE up on the bed performing a sit to supine General transfer comment: Pt was able to stand from an elevated surface with supervision for safety General Gait Details: Pt used a RW to ambulate 344ft with no LOB or need for rest breaks. Pt stated that since he had been moving his R hip was feeling better. Pt was supervision for safety. 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 Pt was able to lift B LE up on the bed performing a sit to supine  Transfers Overall transfer level: Modified independent Equipment used: Rolling walker (2 wheeled) Transfers: Sit to/from Stand Sit to Stand: From elevated surface;Supervision         General  transfer comment: Pt was able to stand from an elevated surface with supervision for safety  Ambulation/Gait Ambulation/Gait assistance: Supervision Gait Distance (Feet): 300 Feet Assistive device: Rolling walker (2 wheeled) Gait Pattern/deviations: Decreased step length - right;Decreased step length - left;Step-through pattern;Decreased stride length Gait velocity: decreased   General Gait Details: Pt used a RW to ambulate 376ft with no LOB or need for rest breaks. Pt stated that since he had been moving his R hip was feeling better. Pt was supervision for safety. Pt instucted on technique for turning and safety   Stairs             Wheelchair Mobility    Modified Rankin (Stroke Patients Only)       Balance                                            Cognition Arousal/Alertness: Awake/alert Behavior During Therapy: WFL for tasks assessed/performed Overall Cognitive Status: Within Functional Limits for tasks assessed                                        Exercises      General Comments        Pertinent Vitals/Pain Pain Assessment: 0-10 Pain Score: 4  Pain Location: R hip/back Pain Descriptors / Indicators: Discomfort;Sore;Aching;Guarding Pain Intervention(s): Monitored during session;Repositioned  Home Living                      Prior Function            PT Goals (current goals can now be found in the care plan section) Progress towards PT goals: Progressing toward goals    Frequency    Min 2X/week      PT Plan Current plan remains appropriate    Co-evaluation              AM-PAC PT "6 Clicks" Mobility   Outcome Measure  Help needed turning from your back to your side while in a flat bed without using bedrails?: A Lot Help needed moving from lying on your back to sitting on the side of a flat bed without using bedrails?: A Lot Help needed moving to and from a bed to a chair (including a  wheelchair)?: A Little Help needed standing up from a chair using your arms (e.g., wheelchair or bedside chair)?: A Little Help needed to walk in hospital room?: A Little Help needed climbing 3-5 steps with a railing? : Total 6 Click Score: 14    End of Session Equipment Utilized During Treatment: Gait belt Activity Tolerance: Patient tolerated treatment well Patient left: in bed;with call bell/phone within reach Nurse Communication: Mobility status PT Visit Diagnosis: Muscle weakness (generalized) (M62.81);Difficulty in walking, not elsewhere classified (R26.2)     Time: 4008-6761 PT Time Calculation (min) (ACUTE ONLY): 25 min  Charges:  $Gait Training: 8-22 mins $Therapeutic Activity: 8-22 mins                     Excell Seltzer, [facility]

## 2019-04-07 NOTE — Progress Notes (Signed)
Physical Therapy Treatment Patient Details Name: Franklin Anderson MRN: [id number] DOB: 1970/10/10 Today's Date: 04/07/2019    History of Present Illness Sepsis - MSSA bacteremia with L4/L5 osteomyelitis and discitis, epidural abscess, psoas abscess, septic pulmonary emboli, empyema and right ventricular mural vegetation    PT Comments    Pt "having a bad day today" with increased c/o muscle spasms and Right hip pain.  Assisted OOB to amb in hallway with a walker. General Gait Details: decreased distance due to increased c/o R hip pain > yesterday.  Excessive lean on walker and guarding throughout.  Follow Up Recommendations  SNF     Equipment Recommendations       Recommendations for Other Services       Precautions / Restrictions Precautions Precautions: Fall Precaution Comments: L4L5 osteo with R hip pain Restrictions Weight Bearing Restrictions: No Other Position/Activity Restrictions: WBAT    Mobility  Bed Mobility Overal bed mobility: Modified Independent Bed Mobility: Sit to Supine           General bed mobility comments: increased time  Transfers Overall transfer level: Needs assistance Equipment used: Rolling walker (2 wheeled) Transfers: Sit to/from Stand Sit to Stand: From elevated surface;Supervision Stand pivot transfers: Supervision;Min guard       General transfer comment: Pt was able to stand from an elevated surface with supervision for safety  Ambulation/Gait Ambulation/Gait assistance: Supervision;Min guard Gait Distance (Feet): 175 Feet Assistive device: Rolling walker (2 wheeled) Gait Pattern/deviations: Decreased step length - right;Decreased step length - left;Step-through pattern;Decreased stride length Gait velocity: decreased   General Gait Details: decreased distance due to increased c/o R hip pain > yesterday.  Excessive lean on walker and guarding throughout.   Stairs             Wheelchair Mobility    Modified  Rankin (Stroke Patients Only)       Balance                                            Cognition Arousal/Alertness: Awake/alert Behavior During Therapy: WFL for tasks assessed/performed Overall Cognitive Status: Within Functional Limits for tasks assessed                                 General Comments: increased pain today vs yesterday      Exercises      General Comments        Pertinent Vitals/Pain Pain Assessment: 0-10 Pain Score: 8  Pain Location: R hip "ddep" Pain Descriptors / Indicators: Discomfort;Sore;Aching;Guarding Pain Intervention(s): Monitored during session;Repositioned    Home Living                      Prior Function            PT Goals (current goals can now be found in the care plan section) Progress towards PT goals: Progressing toward goals    Frequency    Min 2X/week      PT Plan Current plan remains appropriate    Co-evaluation              AM-PAC PT "6 Clicks" Mobility   Outcome Measure  Help needed turning from your back to your side while in a flat bed without using bedrails?: A Lot Help needed moving from lying  on your back to sitting on the side of a flat bed without using bedrails?: A Lot Help needed moving to and from a bed to a chair (including a wheelchair)?: A Little Help needed standing up from a chair using your arms (e.g., wheelchair or bedside chair)?: A Little Help needed to walk in hospital room?: A Little Help needed climbing 3-5 steps with a railing? : Total 6 Click Score: 14    End of Session Equipment Utilized During Treatment: Gait belt Activity Tolerance: Patient limited by pain Patient left: in bed;with call bell/phone within reach Nurse Communication: Mobility status PT Visit Diagnosis: Muscle weakness (generalized) (M62.81);Difficulty in walking, not elsewhere classified (R26.2)     Time: 3559-7416 PT Time Calculation (min) (ACUTE ONLY): 24  min  Charges:  $Gait Training: 8-22 mins $Therapeutic Activity: 8-22 mins                     Rica Koyanagi  PTA Acute  Rehabilitation Services Pager      [phone] Office      [phone]

## 2019-04-07 NOTE — Progress Notes (Signed)
PROGRESS NOTE    Franklin Anderson  PQZ:[id number] DOB: 10-15-1970 DOA: 02/21/2019 PCP: Mack Hook, MD    Brief Narrative: Franklin Anderson [phone] y.o.malewith medical history significant of rheumatic fever, HTN, ongoingIV heroin abuse, nicotine abuse, presented to the ED on 10/27 with 3 week course of progressively worsening back pain and BLE weakness associated with some bowel incontinence and trouble walking. Last use of heroin was on the day of admission. In ED > Found to have sepsis with Tm 100, HR 126, RR 31, WBC 22.4k,AKI with BUN 111 and creat 1.7. Imaging studies revealed osteomyelitis and diskitis of L4-L5,septic pulmonary emboli and psoas abscesses. He was started on IV antibiotics and admitted to [facility]. On 10/28 was found to be obtunded and dyspneic and was intubated. Blood cultures from admission grew out MSSA, subsequently found to have RV vegetation, left sided empyema (MSSA) and a left pneumothorax for which he received a chest tube.He also received a drain in his psoas abscess. Both chest tube and drain have been removed and ID has signed off with instructions to continue IV antibiotics until 12/16. Patient on multiple pain meds due to high pain threshold (IV drug abuse)  Assessment & Plan:   Principal Problem:   Sepsis ([place]) Active Problems:   Dyspnea   Heroin use   Endocarditis   Septic pulmonary embolism ([place])   Osteomyelitis of thoracic spine ([place])   Osteomyelitis of lumbar spine (HCC)   Acute encephalopathy   AKI (acute kidney injury) ([place])   Transaminitis   Psoas abscess (HCC)   Pressure injury of skin   Mucus clot in bronchi   Protein-calorie malnutrition, severe   Abnormal echocardiogram   MSSA bacteremia   Acute pulmonary embolism (HCC)   Pleural effusion on left   Empyema, left (HCC)   Chest tube in place   Pain   Pneumothorax on left   Fistula   Epidural abscess  Sepsis with MSSA bacteremia, L4-L5 osteomyelitis and discitis, epidural  abscess, psoas abscess, septic pulmonary emboli, empyema and right ventricular mural vegetation Currently afebrile, with no leukocytosis. Right psoas abscess was aspirated on 10/28, culture positive for MSSA. Respiratory culture 10/28 also MSSA 2D echo 10/28 showed possible mitral valve vegetation and moderate pericardial effusion. Underwent TEE on 11/3 showed medium sized, mobile mass attached to the trabeculation in the RV and moderate sized pericardial effusion Per CT surgery, not a good candidate for thoracotomy MRI on 11/17 showed increasing psoas abscess s/p catheter drainage of right paraspinal abscess by IR on 11/18 ID recommended continue IV Ancef through 12/16 followed by Keflex for 30 days. Continue IV Ancef Pain management consists of Toradol, fentanyl patch, Flexeril, oral Dilaudid as needed and oral oxycodone prn for breakthrough pain. Monitor for narcotic seeking behavior  Acute respiratory failure with hypoxemia secondary to MSSA infection, septic pulmonary emboli, left empyema, left bronchopleural fistula (hydropneumothorax) Resolved, currently on RA Patient was intubated on 10/28-10/31 Underwent bronchoscopy with BAL, s/p left chest tube, removed on 11/17 Chest x-ray 12/2 showed bilateral pulmonary nodules, which are improved compared to prior exam on 11/19.  Small residual left apical pneumothorax  Moderate pericardial effusion Seen on 2D echo and TEE, treated conservatively and improving Last echo on 11/9 showed mild effusion (improvement)  Essential hypertension BP stable Continue Norvasc, metoprolol  Severe protein calorie malnutrition Continue dietary supplements  HCV positive Per ID, seems to have cleared  Pressure injury Sacrum with stage I, Right hip deep tissue injury Wound care per nursing   Code Status:  Full code DVT Prophylaxis:  Lovenox  Family Communication: None at bedside Disposition Plan: Remains inpatient till completes IV  antibiotics, completes on 12/16  Consultants:  Infectious disease, cardiology, palliative care, cardiothoracic surgery  Procedures:  TEE Intubation  Antimicrobials: Anti-infectives (From admission, onward)   Start     Dose/Rate Route Frequency Ordered Stop   02/24/19 2200  vancomycin (VANCOCIN) IVPB 1000 mg/200 mL premix  Status:  Discontinued     1,000 mg 200 mL/hr over 60 Minutes Intravenous Every 12 hours 02/24/19 1010 02/24/19 1253   02/24/19 1600  ceFAZolin (ANCEF) IVPB 2g/100 mL premix     2 g 200 mL/hr over 30 Minutes Intravenous Every 8 hours 02/24/19 1253 04/12/19 2359   02/23/19 2200  vancomycin (VANCOCIN) IVPB 750 mg/150 ml premix  Status:  Discontinued     750 mg 150 mL/hr over 60 Minutes Intravenous Every 24 hours 02/23/19 0853 02/23/19 0909   02/23/19 2200  vancomycin (VANCOCIN) IVPB 750 mg/150 ml premix  Status:  Discontinued     750 mg 150 mL/hr over 60 Minutes Intravenous Every 12 hours 02/23/19 0909 02/24/19 1010   02/22/19 2200  vancomycin (VANCOCIN) IVPB 750 mg/150 ml premix  Status:  Discontinued     750 mg 150 mL/hr over 60 Minutes Intravenous Every 24 hours 02/22/19 0826 02/22/19 0827   02/22/19 2200  vancomycin (VANCOCIN) IVPB 1000 mg/200 mL premix  Status:  Discontinued     1,000 mg 200 mL/hr over 60 Minutes Intravenous Every 24 hours 02/22/19 0827 02/23/19 0853   02/22/19 0900  ceFEPIme (MAXIPIME) 2 g in sodium chloride 0.9 % 100 mL IVPB  Status:  Discontinued     2 g 200 mL/hr over 30 Minutes Intravenous Every 12 hours 02/22/19 0826 02/23/19 1135   02/21/19 2015  ceFEPIme (MAXIPIME) 2 g in sodium chloride 0.9 % 100 mL IVPB     2 g 200 mL/hr over 30 Minutes Intravenous  Once 02/21/19 2013 02/22/19 0031   02/21/19 2015  metroNIDAZOLE (FLAGYL) IVPB 500 mg     500 mg 100 mL/hr over 60 Minutes Intravenous  Once 02/21/19 2013 02/22/19 0031   02/21/19 2015  vancomycin (VANCOCIN) IVPB 1000 mg/200 mL premix     1,000 mg 200 mL/hr over 60 Minutes Intravenous   Once 02/21/19 2013 02/22/19 0200      Subjective: Patient was seen and examined at bedside.  He reports the pain is not controlled, asks for Dilaudid. Denies any chest pain shortness of breath abdominal pain fever chills. Objective: Vitals:   04/07/19 0609 04/07/19 1022 04/07/19 1233 04/07/19 1329  BP: 126/82 (!) 150/89 (!) 149/94 (!) 159/104  Pulse:  (!) 112 (!) 101 (!) 105  Resp:   16 20  Temp:   98.4 F (36.9 C) 98.6 F (37 C)  TempSrc:   Oral Oral  SpO2:   100% 100%  Weight:      Height:        Intake/Output Summary (Last 24 hours) at 04/07/2019 1446 Last data filed at 04/07/2019 1342 Gross per 24 hour  Intake 1862.74 ml  Output 2350 ml  Net -487.26 ml   Filed Weights   03/14/19 0500 03/15/19 0522 03/15/19 2000  Weight: 49.8 kg 49.9 kg 49.9 kg    Examination:  General exam: Appears calm and comfortable  Respiratory system: Clear to auscultation. Respiratory effort normal. Cardiovascular system: S1 & S2 heard, RRR. No JVD, murmurs, rubs, gallops or clicks. No pedal edema. Gastrointestinal system: Abdomen is nondistended, soft and  nontender. No organomegaly or masses felt. Normal bowel sounds heard. Central nervous system: Alert and oriented. No focal neurological deficits. Extremities :  no pedal edema, no calf tenderness. Skin: No rashes, lesions or ulcers Psychiatry: Judgement and insight appear normal. Mood & affect appropriate.     Data Reviewed: I have personally reviewed following labs and imaging studies  CBC: Recent Labs  Lab 04/03/19 0855 04/05/19 0850  WBC 7.0 6.4  HGB 10.2* 9.9*  HCT 33.4* 31.7*  MCV 96.0 97.2  PLT 385 588   Basic Metabolic Panel: Recent Labs  Lab 04/03/19 0855 04/05/19 0850 04/06/19 0630  NA 138 138 138  K 4.1 4.4 4.3  CL 98 101 103  CO2 _0 GLUCOSE 96 93 86  BUN 14 28* 20  CREATININE 0.39* 0.49* 0.42*  CALCIUM 9.5 9.2 9.0   GFR: Estimated Creatinine Clearance: 78.8 mL/min (A) (by C-G formula based on SCr  of 0.42 mg/dL (L)). Liver Function Tests: No results for input(s): AST, ALT, ALKPHOS, BILITOT, PROT, ALBUMIN in the last 168 hours. No results for input(s): LIPASE, AMYLASE in the last 168 hours. No results for input(s): AMMONIA in the last 168 hours. Coagulation Profile: No results for input(s): INR, PROTIME in the last 168 hours. Cardiac Enzymes: No results for input(s): CKTOTAL, CKMB, CKMBINDEX, TROPONINI in the last 168 hours. BNP (last 3 results) No results for input(s): PROBNP in the last 8760 hours. HbA1C: No results for input(s): HGBA1C in the last 72 hours. CBG: Recent Labs  Lab 03/31/19 1622 03/31/19 2233 04/01/19 0737  GLUCAP 100* 131* 91   Lipid Profile: No results for input(s): CHOL, HDL, LDLCALC, TRIG, CHOLHDL, LDLDIRECT in the last 72 hours. Thyroid Function Tests: No results for input(s): TSH, T4TOTAL, FREET4, T3FREE, THYROIDAB in the last 72 hours. Anemia Panel: No results for input(s): VITAMINB12, FOLATE, FERRITIN, TIBC, IRON, RETICCTPCT in the last 72 hours. Sepsis Labs: No results for input(s): PROCALCITON, LATICACIDVEN in the last 168 hours.  No results found for this or any previous visit (from the past 240 hour(s)).   Radiology Studies: No results found.  Scheduled Meds: . amLODipine  10 mg Oral Daily  . cyclobenzaprine  10 mg Oral TID  . enoxaparin (LOVENOX) injection  40 mg Subcutaneous Q24H  . feeding supplement (ENSURE ENLIVE)  237 mL Oral BID BM  . fentaNYL  1 patch Transdermal Q72H  . ketorolac  15 mg Intravenous Q6H  . lidocaine  1 patch Transdermal Q24H  . mouth rinse  15 mL Mouth Rinse BID  . metoprolol tartrate  100 mg Oral BID  . multivitamin with minerals  1 tablet Oral Daily  . pantoprazole  40 mg Oral Daily  . polyethylene glycol  17 g Oral Daily   Continuous Infusions: . sodium chloride 1,000 mL (04/03/19 2141)  .  ceFAZolin (ANCEF) IV 2 g (04/07/19 1346)     LOS: 44 days    Time spent:   Shawna Clamp, MD [facility]   If 7PM-7AM, please contact night-coverage

## 2019-04-07 NOTE — Progress Notes (Signed)
BP= 138/103. Patient has history of HTN. Pain at back at 7. Pain medication was given. Hydralazine IV was given as MD ordered. Will recheck Bp later.

## 2019-04-08 LAB — GLUCOSE, CAPILLARY
Glucose-Capillary: 98 mg/dL (ref 70–99)
Glucose-Capillary: 84 mg/dL (ref 70–99)
Glucose-Capillary: 154 mg/dL — ABNORMAL HIGH (ref 70–99)

## 2019-04-08 LAB — CBC
HCT: 33.1 % — ABNORMAL LOW (ref 39.0–52.0)
Hemoglobin: 10.6 g/dL — ABNORMAL LOW (ref 13.0–17.0)
MCH: 30.5 pg (ref 26.0–34.0)
MCHC: 32 g/dL (ref 30.0–36.0)
MCV: 95.4 fL (ref 80.0–100.0)
Platelets: 362 10*3/uL (ref 150–400)
RBC: 3.47 MIL/uL — ABNORMAL LOW (ref 4.22–5.81)
RDW: 16.6 % — ABNORMAL HIGH (ref 11.5–15.5)
WBC: 5.5 10*3/uL (ref 4.0–10.5)
nRBC: 0 % (ref 0.0–0.2)

## 2019-04-08 NOTE — Progress Notes (Signed)
BP= 157/106. Patient has history of HTN. IV hydralazine PRN was given. Patient is alert and oriented x 4. Will recheck BP later.

## 2019-04-08 NOTE — Progress Notes (Signed)
PROGRESS NOTE    Franklin Anderson  TSV:[id number] DOB: 04-03-1971 DOA: 02/21/2019 PCP: Mack Hook, MD    Brief Narrative: Franklin Anderson [phone] y.o.malewith medical history significant of rheumatic fever, HTN, ongoingIV heroin abuse, nicotine abuse, presented to the ED on 10/27 with 3 week course of progressively worsening back pain and BLE weakness associated with some bowel incontinence and trouble walking. Last use of heroin was on the day of admission. In ED > Found to have sepsis with Tm 100, HR 126, RR 31, WBC 22.4k,AKI with BUN 111 and creat 1.7. Imaging studies revealed osteomyelitis and diskitis of L4-L5,septic pulmonary emboli and psoas abscesses. He was started on IV antibiotics and admitted to [facility]. On 10/28 was found to be obtunded and dyspneic and was intubated. Blood cultures from admission grew out MSSA, subsequently found to have RV vegetation, left sided empyema (MSSA) and a left pneumothorax for which he received a chest tube.He also received a drain in his psoas abscess. Both chest tube and drain have been removed and ID has signed off with instructions to continue IV antibiotics until 12/16. Patient on multiple pain meds due to high pain threshold (IV drug abuse)  Assessment & Plan:   Principal Problem:   Sepsis ([place]) Active Problems:   Dyspnea   Heroin use   Endocarditis   Septic pulmonary embolism ([place])   Osteomyelitis of thoracic spine ([place])   Osteomyelitis of lumbar spine (HCC)   Acute encephalopathy   AKI (acute kidney injury) ([place])   Transaminitis   Psoas abscess (HCC)   Pressure injury of skin   Mucus clot in bronchi   Protein-calorie malnutrition, severe   Abnormal echocardiogram   MSSA bacteremia   Acute pulmonary embolism (HCC)   Pleural effusion on left   Empyema, left (HCC)   Chest tube in place   Pain   Pneumothorax on left   Fistula   Epidural abscess  Sepsis with MSSA bacteremia, L4-L5 osteomyelitis and discitis, epidural  abscess, psoas abscess, septic pulmonary emboli, empyema and right ventricular mural vegetation Currently afebrile, with no leukocytosis. Right psoas abscess was aspirated on 10/28, culture positive for MSSA. Respiratory culture 10/28 also MSSA 2D echo 10/28 showed possible mitral valve vegetation and moderate pericardial effusion. Underwent TEE on 11/3 showed medium sized, mobile mass attached to the trabeculation in the RV and moderate sized pericardial effusion Per CT surgery, not a good candidate for thoracotomy MRI on 11/17 showed increasing psoas abscess s/p catheter drainage of right paraspinal abscess by IR on 11/18 ID recommended continue IV Ancef through 12/16 followed by Keflex for 30 days. Continue IV Ancef Pain management consists of Toradol, fentanyl patch, Flexeril, oral Dilaudid as needed and oral oxycodone prn for breakthrough pain. Monitor for narcotic seeking behavior  Acute respiratory failure with hypoxemia secondary to MSSA infection, septic pulmonary emboli, left empyema, left bronchopleural fistula (hydropneumothorax) Resolved, currently on RA Patient was intubated on 10/28-10/31 Underwent bronchoscopy with BAL, s/p left chest tube, removed on 11/17 Chest x-ray 12/2 showed bilateral pulmonary nodules, which are improved compared to prior exam on 11/19.  Small residual left apical pneumothorax  Moderate pericardial effusion Seen on 2D echo and TEE, treated conservatively and improving Last echo on 11/9 showed mild effusion (improvement)  Essential hypertension BP stable Continue Norvasc, metoprolol  Severe protein calorie malnutrition Continue dietary supplements  HCV positive Per ID, seems to have cleared  Pressure injury Sacrum with stage I, Right hip deep tissue injury Wound care per nursing   Code Status:  Full code DVT Prophylaxis:  Lovenox  Family Communication: None at bedside Disposition Plan: Remains inpatient till completes IV  antibiotics, completes on 12/16  Consultants:  Infectious disease, cardiology, palliative care, cardiothoracic surgery  Procedures:  TEE Intubation  Antimicrobials: Anti-infectives (From admission, onward)   Start     Dose/Rate Route Frequency Ordered Stop   02/24/19 2200  vancomycin (VANCOCIN) IVPB 1000 mg/200 mL premix  Status:  Discontinued     1,000 mg 200 mL/hr over 60 Minutes Intravenous Every 12 hours 02/24/19 1010 02/24/19 1253   02/24/19 1600  ceFAZolin (ANCEF) IVPB 2g/100 mL premix     2 g 200 mL/hr over 30 Minutes Intravenous Every 8 hours 02/24/19 1253 04/12/19 2359   02/23/19 2200  vancomycin (VANCOCIN) IVPB 750 mg/150 ml premix  Status:  Discontinued     750 mg 150 mL/hr over 60 Minutes Intravenous Every 24 hours 02/23/19 0853 02/23/19 0909   02/23/19 2200  vancomycin (VANCOCIN) IVPB 750 mg/150 ml premix  Status:  Discontinued     750 mg 150 mL/hr over 60 Minutes Intravenous Every 12 hours 02/23/19 0909 02/24/19 1010   02/22/19 2200  vancomycin (VANCOCIN) IVPB 750 mg/150 ml premix  Status:  Discontinued     750 mg 150 mL/hr over 60 Minutes Intravenous Every 24 hours 02/22/19 0826 02/22/19 0827   02/22/19 2200  vancomycin (VANCOCIN) IVPB 1000 mg/200 mL premix  Status:  Discontinued     1,000 mg 200 mL/hr over 60 Minutes Intravenous Every 24 hours 02/22/19 0827 02/23/19 0853   02/22/19 0900  ceFEPIme (MAXIPIME) 2 g in sodium chloride 0.9 % 100 mL IVPB  Status:  Discontinued     2 g 200 mL/hr over 30 Minutes Intravenous Every 12 hours 02/22/19 0826 02/23/19 1135   02/21/19 2015  ceFEPIme (MAXIPIME) 2 g in sodium chloride 0.9 % 100 mL IVPB     2 g 200 mL/hr over 30 Minutes Intravenous  Once 02/21/19 2013 02/22/19 0031   02/21/19 2015  metroNIDAZOLE (FLAGYL) IVPB 500 mg     500 mg 100 mL/hr over 60 Minutes Intravenous  Once 02/21/19 2013 02/22/19 0031   02/21/19 2015  vancomycin (VANCOCIN) IVPB 1000 mg/200 mL premix     1,000 mg 200 mL/hr over 60 Minutes Intravenous   Once 02/21/19 2013 02/22/19 0200      Subjective: Patient was seen and examined at bedside.  He reports pain is better controlled.  Denies any chest pain shortness of breath,  abdominal pain, fever ,chills.  Objective: Vitals:   04/08/19 0319 04/08/19 0400 04/08/19 0628 04/08/19 1415  BP: (!) 157/106  (!) 143/96 (!) 131/91  Pulse: 91  (!) 101 100  Resp: _0 Temp: (!) 97.4 F (36.3 C) 97.8 F (36.6 C) 98.2 F (36.8 C) 98 F (36.7 C)  TempSrc: Oral Oral Oral Oral  SpO2: 99%  98% 96%  Weight:      Height:        Intake/Output Summary (Last 24 hours) at 04/08/2019 1439 Last data filed at 04/08/2019 7494 Gross per 24 hour  Intake 1129.42 ml  Output 1250 ml  Net -120.58 ml   Filed Weights   03/14/19 0500 03/15/19 0522 03/15/19 2000  Weight: 49.8 kg 49.9 kg 49.9 kg    Examination:  General exam: Appears calm and comfortable  Respiratory system: Clear to auscultation. Respiratory effort normal. Cardiovascular system: S1 & S2 heard, RRR. No JVD, murmurs, rubs, gallops or clicks. No pedal edema. Gastrointestinal system: Abdomen  is nondistended, soft and nontender. No organomegaly or masses felt. Normal bowel sounds heard. Central nervous system: Alert and oriented. No focal neurological deficits. Extremities :  no pedal edema, no calf tenderness. Skin: No rashes, lesions or ulcers Psychiatry: Judgement and insight appear normal. Mood & affect appropriate.     Data Reviewed: I have personally reviewed following labs and imaging studies  CBC: Recent Labs  Lab 04/03/19 0855 04/05/19 0850 04/08/19 0910  WBC 7.0 6.4 5.5  HGB 10.2* 9.9* 10.6*  HCT 33.4* 31.7* 33.1*  MCV 96.0 97.2 95.4  PLT 385 371 528   Basic Metabolic Panel: Recent Labs  Lab 04/03/19 0855 04/05/19 0850 04/06/19 0630  NA 138 138 138  K 4.1 4.4 4.3  CL 98 101 103  CO2 _0 GLUCOSE 96 93 86  BUN 14 28* 20  CREATININE 0.39* 0.49* 0.42*  CALCIUM 9.5 9.2 9.0   GFR: Estimated  Creatinine Clearance: 78.8 mL/min (A) (by C-G formula based on SCr of 0.42 mg/dL (L)). Liver Function Tests: No results for input(s): AST, ALT, ALKPHOS, BILITOT, PROT, ALBUMIN in the last 168 hours. No results for input(s): LIPASE, AMYLASE in the last 168 hours. No results for input(s): AMMONIA in the last 168 hours. Coagulation Profile: No results for input(s): INR, PROTIME in the last 168 hours. Cardiac Enzymes: No results for input(s): CKTOTAL, CKMB, CKMBINDEX, TROPONINI in the last 168 hours. BNP (last 3 results) No results for input(s): PROBNP in the last 8760 hours. HbA1C: No results for input(s): HGBA1C in the last 72 hours. CBG: Recent Labs  Lab 04/08/19 0751 04/08/19 1201  GLUCAP 98 84   Lipid Profile: No results for input(s): CHOL, HDL, LDLCALC, TRIG, CHOLHDL, LDLDIRECT in the last 72 hours. Thyroid Function Tests: No results for input(s): TSH, T4TOTAL, FREET4, T3FREE, THYROIDAB in the last 72 hours. Anemia Panel: No results for input(s): VITAMINB12, FOLATE, FERRITIN, TIBC, IRON, RETICCTPCT in the last 72 hours. Sepsis Labs: No results for input(s): PROCALCITON, LATICACIDVEN in the last 168 hours.  No results found for this or any previous visit (from the past 240 hour(s)).   Radiology Studies: No results found.  Scheduled Meds: . amLODipine  10 mg Oral Daily  . cyclobenzaprine  10 mg Oral TID  . enoxaparin (LOVENOX) injection  40 mg Subcutaneous Q24H  . feeding supplement (ENSURE ENLIVE)  237 mL Oral BID BM  . fentaNYL  1 patch Transdermal Q72H  . ketorolac  15 mg Intravenous Q6H  . lidocaine  1 patch Transdermal Q24H  . mouth rinse  15 mL Mouth Rinse BID  . metoprolol tartrate  100 mg Oral BID  . multivitamin with minerals  1 tablet Oral Daily  . pantoprazole  40 mg Oral Daily  . polyethylene glycol  17 g Oral Daily   Continuous Infusions: . sodium chloride 1,000 mL (04/03/19 2141)  .  ceFAZolin (ANCEF) IV 2 g (04/08/19 1308)     LOS: 45 days     Time spent:   Shawna Clamp, MD [facility]   If 7PM-7AM, please contact night-coverage

## 2019-04-09 NOTE — Progress Notes (Signed)
PROGRESS NOTE    Franklin Anderson  ZOX:[id number] DOB: December 22, 1970 DOA: 02/21/2019 PCP: Mack Hook, MD    Brief Narrative: Franklin Anderson [phone] y.o.malewith medical history significant of rheumatic fever, HTN, ongoingIV heroin abuse, nicotine abuse, presented to the ED on 10/27 with 3 week course of progressively worsening back pain and BLE weakness associated with some bowel incontinence and trouble walking. Last use of heroin was on the day of admission. In ED > Found to have sepsis with Tm 100, HR 126, RR 31, WBC 22.4k,AKI with BUN 111 and creat 1.7. Imaging studies revealed osteomyelitis and diskitis of L4-L5,septic pulmonary emboli and psoas abscesses. He was started on IV antibiotics and admitted to [facility]. On 10/28 was found to be obtunded and dyspneic and was intubated. Blood cultures from admission grew out MSSA, subsequently found to have RV vegetation, left sided empyema (MSSA) and a left pneumothorax for which he received a chest tube.He also received a drain in his psoas abscess. Both chest tube and drain have been removed and ID has signed off with instructions to continue IV antibiotics until 12/16. Patient on multiple pain meds due to high pain threshold (IV drug abuse)  Assessment & Plan:   Principal Problem:   Sepsis ([place]) Active Problems:   Dyspnea   Heroin use   Endocarditis   Septic pulmonary embolism (HCC)   Osteomyelitis of thoracic spine (HCC)   Osteomyelitis of lumbar spine (HCC)   Acute encephalopathy   AKI (acute kidney injury) ([place])   Transaminitis   Psoas abscess (HCC)   Pressure injury of skin   Mucus clot in bronchi   Protein-calorie malnutrition, severe   Abnormal echocardiogram   MSSA bacteremia   Acute pulmonary embolism (HCC)   Pleural effusion on left   Empyema, left (HCC)   Chest tube in place   Pain   Pneumothorax on left   Fistula   Epidural abscess  Sepsis with MSSA bacteremia, L4-L5 osteomyelitis and discitis, epidural  abscess, psoas abscess, septic pulmonary emboli, empyema and right ventricular mural vegetation. Currently afebrile, with no leukocytosis. Right psoas abscess was aspirated on 10/28, culture positive for MSSA. Respiratory culture 10/28 also MSSA 2D echo 10/28 showed possible mitral valve vegetation and moderate pericardial effusion. Underwent TEE on 11/3 showed medium sized, mobile mass attached to the trabeculation in the RV and moderate sized pericardial effusion Per CT surgery, not a good candidate for thoracotomy MRI on 11/17 showed increasing psoas abscess s/p catheter drainage of right paraspinal abscess by IR on 11/18 ID recommended continue IV Ancef through 12/16 followed by Keflex for 30 days. Continue IV Ancef Pain management consists of Toradol, fentanyl patch, Flexeril, oral Dilaudid as needed and oral oxycodone prn for breakthrough pain. Monitor for narcotic seeking behavior  Acute respiratory failure with hypoxemia secondary to MSSA infection, septic pulmonary emboli, left empyema, left bronchopleural fistula (hydropneumothorax) Resolved, currently on RA Patient was intubated on 10/28-10/31 Underwent bronchoscopy with BAL, s/p left chest tube, removed on 11/17 Chest x-ray 12/2 showed bilateral pulmonary nodules, which are improved compared to prior exam on 11/19.  Small residual left apical pneumothorax  Moderate pericardial effusion Seen on 2D echo and TEE, treated conservatively and improving Last echo on 11/9 showed mild effusion (improvement).  Essential hypertension BP stable Continue Norvasc, metoprolol  Severe protein calorie malnutrition Continue dietary supplements  HCV positive Per ID, seems to have cleared  Pressure injury Sacrum with stage I, Right hip deep tissue injury Wound care per nursing   Code Status:  Full code DVT Prophylaxis:  Lovenox  Family Communication: None at bedside Disposition Plan: Remains inpatient till completes IV  antibiotics, completes on 12/16  Consultants:  Infectious disease, cardiology, palliative care, cardiothoracic surgery  Procedures:  TEE Intubation  Antimicrobials: Anti-infectives (From admission, onward)   Start     Dose/Rate Route Frequency Ordered Stop   02/24/19 2200  vancomycin (VANCOCIN) IVPB 1000 mg/200 mL premix  Status:  Discontinued     1,000 mg 200 mL/hr over 60 Minutes Intravenous Every 12 hours 02/24/19 1010 02/24/19 1253   02/24/19 1600  ceFAZolin (ANCEF) IVPB 2g/100 mL premix     2 g 200 mL/hr over 30 Minutes Intravenous Every 8 hours 02/24/19 1253 04/12/19 2359   02/23/19 2200  vancomycin (VANCOCIN) IVPB 750 mg/150 ml premix  Status:  Discontinued     750 mg 150 mL/hr over 60 Minutes Intravenous Every 24 hours 02/23/19 0853 02/23/19 0909   02/23/19 2200  vancomycin (VANCOCIN) IVPB 750 mg/150 ml premix  Status:  Discontinued     750 mg 150 mL/hr over 60 Minutes Intravenous Every 12 hours 02/23/19 0909 02/24/19 1010   02/22/19 2200  vancomycin (VANCOCIN) IVPB 750 mg/150 ml premix  Status:  Discontinued     750 mg 150 mL/hr over 60 Minutes Intravenous Every 24 hours 02/22/19 0826 02/22/19 0827   02/22/19 2200  vancomycin (VANCOCIN) IVPB 1000 mg/200 mL premix  Status:  Discontinued     1,000 mg 200 mL/hr over 60 Minutes Intravenous Every 24 hours 02/22/19 0827 02/23/19 0853   02/22/19 0900  ceFEPIme (MAXIPIME) 2 g in sodium chloride 0.9 % 100 mL IVPB  Status:  Discontinued     2 g 200 mL/hr over 30 Minutes Intravenous Every 12 hours 02/22/19 0826 02/23/19 1135   02/21/19 2015  ceFEPIme (MAXIPIME) 2 g in sodium chloride 0.9 % 100 mL IVPB     2 g 200 mL/hr over 30 Minutes Intravenous  Once 02/21/19 2013 02/22/19 0031   02/21/19 2015  metroNIDAZOLE (FLAGYL) IVPB 500 mg     500 mg 100 mL/hr over 60 Minutes Intravenous  Once 02/21/19 2013 02/22/19 0031   02/21/19 2015  vancomycin (VANCOCIN) IVPB 1000 mg/200 mL premix     1,000 mg 200 mL/hr over 60 Minutes Intravenous   Once 02/21/19 2013 02/22/19 0200      Subjective: Patient was seen and examined at bedside.  He complains of worsening pain,  denies any chest pain, shortness of breath,  abdominal pain, fever ,chills.  No overnight events.  Objective: Vitals:   04/08/19 1415 04/08/19 2038 04/09/19 0547 04/09/19 1337  BP: (!) 131/91 (!) 149/89 (!) 139/101 (!) 147/102  Pulse: 100 99 90 93  Resp: _0 Temp: 98 F (36.7 C) 98.2 F (36.8 C) 97.7 F (36.5 C) 98.5 F (36.9 C)  TempSrc: Oral Oral Oral Oral  SpO2: 96% 97% 97% 97%  Weight:      Height:        Intake/Output Summary (Last 24 hours) at 04/09/2019 1719 Last data filed at 04/09/2019 1018 Gross per 24 hour  Intake 120 ml  Output 700 ml  Net -580 ml   Filed Weights   03/14/19 0500 03/15/19 0522 03/15/19 2000  Weight: 49.8 kg 49.9 kg 49.9 kg    Examination:  General exam: Appears in a lot of pain. Respiratory system: Clear to auscultation. Respiratory effort normal. Cardiovascular system: S1 & S2 heard, RRR. No JVD, murmurs, rubs, gallops or clicks. No pedal edema.  Gastrointestinal system: Abdomen is nondistended, soft and nontender. No organomegaly or masses felt. Normal bowel sounds heard. Central nervous system: Alert and oriented. No focal neurological deficits. Extremities :  no pedal edema, no calf tenderness. Skin: No rashes, lesions or ulcers Psychiatry: Judgement and insight appear normal. Mood & affect appropriate.     Data Reviewed: I have personally reviewed following labs and imaging studies  CBC: Recent Labs  Lab 04/03/19 0855 04/05/19 0850 04/08/19 0910  WBC 7.0 6.4 5.5  HGB 10.2* 9.9* 10.6*  HCT 33.4* 31.7* 33.1*  MCV 96.0 97.2 95.4  PLT 385 371 952   Basic Metabolic Panel: Recent Labs  Lab 04/03/19 0855 04/05/19 0850 04/06/19 0630  NA 138 138 138  K 4.1 4.4 4.3  CL 98 101 103  CO2 _0 GLUCOSE 96 93 86  BUN 14 28* 20  CREATININE 0.39* 0.49* 0.42*  CALCIUM 9.5 9.2 9.0    GFR: Estimated Creatinine Clearance: 78.8 mL/min (A) (by C-G formula based on SCr of 0.42 mg/dL (L)). Liver Function Tests: No results for input(s): AST, ALT, ALKPHOS, BILITOT, PROT, ALBUMIN in the last 168 hours. No results for input(s): LIPASE, AMYLASE in the last 168 hours. No results for input(s): AMMONIA in the last 168 hours. Coagulation Profile: No results for input(s): INR, PROTIME in the last 168 hours. Cardiac Enzymes: No results for input(s): CKTOTAL, CKMB, CKMBINDEX, TROPONINI in the last 168 hours. BNP (last 3 results) No results for input(s): PROBNP in the last 8760 hours. HbA1C: No results for input(s): HGBA1C in the last 72 hours. CBG: Recent Labs  Lab 04/08/19 0751 04/08/19 1201 04/08/19 1609  GLUCAP 98 84 154*   Lipid Profile: No results for input(s): CHOL, HDL, LDLCALC, TRIG, CHOLHDL, LDLDIRECT in the last 72 hours. Thyroid Function Tests: No results for input(s): TSH, T4TOTAL, FREET4, T3FREE, THYROIDAB in the last 72 hours. Anemia Panel: No results for input(s): VITAMINB12, FOLATE, FERRITIN, TIBC, IRON, RETICCTPCT in the last 72 hours. Sepsis Labs: No results for input(s): PROCALCITON, LATICACIDVEN in the last 168 hours.  No results found for this or any previous visit (from the past 240 hour(s)).   Radiology Studies: No results found.  Scheduled Meds: . amLODipine  10 mg Oral Daily  . cyclobenzaprine  10 mg Oral TID  . enoxaparin (LOVENOX) injection  40 mg Subcutaneous Q24H  . feeding supplement (ENSURE ENLIVE)  237 mL Oral BID BM  . fentaNYL  1 patch Transdermal Q72H  . lidocaine  1 patch Transdermal Q24H  . mouth rinse  15 mL Mouth Rinse BID  . metoprolol tartrate  100 mg Oral BID  . multivitamin with minerals  1 tablet Oral Daily  . pantoprazole  40 mg Oral Daily  . polyethylene glycol  17 g Oral Daily   Continuous Infusions: . sodium chloride 1,000 mL (04/03/19 2141)  .  ceFAZolin (ANCEF) IV 2 g (04/09/19 1403)     LOS: 46 days     Time spent:   Shawna Clamp, MD [facility]   If 7PM-7AM, please contact night-coverage

## 2019-04-09 NOTE — Progress Notes (Signed)
Pt refused to ambulate today due to increased pain and muscle spasms in right leg.

## 2019-04-09 NOTE — Progress Notes (Signed)
RN Notified of BO 147/102; Hydralazine given- See MAR

## 2019-04-10 LAB — BASIC METABOLIC PANEL
Anion gap: 11 (ref 5–15)
BUN: 17 mg/dL (ref 6–20)
CO2: 26 mmol/L (ref 22–32)
Calcium: 9.5 mg/dL (ref 8.9–10.3)
Chloride: 100 mmol/L (ref 98–111)
Creatinine, Ser: 0.46 mg/dL — ABNORMAL LOW (ref 0.61–1.24)
GFR calc Af Amer: >60 mL/min (ref >60)
GFR calc non Af Amer: >60 mL/min (ref >60)
Glucose, Bld: 186 mg/dL — ABNORMAL HIGH (ref 70–99)
Potassium: 3.8 mmol/L (ref 3.5–5.1)
Sodium: 137 mmol/L (ref 135–145)

## 2019-04-10 LAB — CBC
HCT: 34.6 % — ABNORMAL LOW (ref 39.0–52.0)
Hemoglobin: 10.9 g/dL — ABNORMAL LOW (ref 13.0–17.0)
MCH: 29.9 pg (ref 26.0–34.0)
MCHC: 31.5 g/dL (ref 30.0–36.0)
MCV: 95.1 fL (ref 80.0–100.0)
Platelets: 355 10*3/uL (ref 150–400)
RBC: 3.64 MIL/uL — ABNORMAL LOW (ref 4.22–5.81)
RDW: 16.1 % — ABNORMAL HIGH (ref 11.5–15.5)
WBC: 7 10*3/uL (ref 4.0–10.5)
nRBC: 0 % (ref 0.0–0.2)

## 2019-04-10 MED ORDER — HYDROCHLOROTHIAZIDE 25 MG PO TABS
25.0000 mg | ORAL_TABLET | Freq: Every day | ORAL | Status: DC
  Administered 2019-04-10 – 2019-04-14 (×5): 25 mg via ORAL
  Filled 2019-04-10 (×5): qty 1

## 2019-04-10 NOTE — Progress Notes (Signed)
Physical Therapy Treatment Patient Details Name: Franklin Anderson MRN: [id number] DOB: July 31, 1970 Today's Date: 04/10/2019    History of Present Illness Sepsis - MSSA bacteremia with L4/L5 osteomyelitis and discitis, epidural abscess, psoas abscess, septic pulmonary emboli, empyema and right ventricular mural vegetation    PT Comments    Attempted to time pt's session along after pain meds.  Pt was not able to amb and only  Briefly, partially sit EOB due to intense R hip pain.  Pt loudly moaning, gripping leg.  Returned back to supine with B knees flex.  Will attempt again tomorrow to mobilize.   Follow Up Recommendations  SNF     Equipment Recommendations       Recommendations for Other Services       Precautions / Restrictions Precautions Precautions: Fall Precaution Comments: L4L5 osteo with R hip pain Restrictions Weight Bearing Restrictions: No Other Position/Activity Restrictions: WBAT    Mobility  Bed Mobility Overal bed mobility: Needs Assistance Bed Mobility: Supine to Sit;Sit to Supine     Supine to sit: Supervision;Min guard Sit to supine: Min guard;Min assist   General bed mobility comments: increased, increased time to attempt sitting EOB however unable to fully complete due to R HIP pain "deep" with "spasms" pt yelling and gripping with pain.  Transfers                 General transfer comment: unable to due intense pain  Ambulation/Gait             General Gait Details: unable due to intense pain   Stairs             Wheelchair Mobility    Modified Rankin (Stroke Patients Only)       Balance                                            Cognition Arousal/Alertness: Awake/alert Behavior During Therapy: WFL for tasks assessed/performed Overall Cognitive Status: Within Functional Limits for tasks assessed                                 General Comments: increased pain      Exercises       General Comments        Pertinent Vitals/Pain Pain Assessment: 0-10 Pain Score: 10-Worst pain ever Pain Location: R hip "ddep" Pain Descriptors / Indicators: Discomfort;Sore;Aching;Guarding Pain Intervention(s): Monitored during session;Premedicated before session;Repositioned    Home Living                      Prior Function            PT Goals (current goals can now be found in the care plan section) Progress towards PT goals: Progressing toward goals    Frequency    Min 2X/week      PT Plan Current plan remains appropriate    Co-evaluation              AM-PAC PT "6 Clicks" Mobility   Outcome Measure  Help needed turning from your back to your side while in a flat bed without using bedrails?: A Lot Help needed moving from lying on your back to sitting on the side of a flat bed without using bedrails?: A Lot Help needed moving to and  from a bed to a chair (including a wheelchair)?: A Lot Help needed standing up from a chair using your arms (e.g., wheelchair or bedside chair)?: A Lot Help needed to walk in hospital room?: A Lot Help needed climbing 3-5 steps with a railing? : Total 6 Click Score: 11    End of Session Equipment Utilized During Treatment: Gait belt Activity Tolerance: Patient limited by pain Patient left: in bed;with call bell/phone within reach Nurse Communication: Mobility status PT Visit Diagnosis: Muscle weakness (generalized) (M62.81);Difficulty in walking, not elsewhere classified (R26.2)     Time: 1535-1600 PT Time Calculation (min) (ACUTE ONLY): 25 min  Charges:  $Therapeutic Activity: 23-37 mins                     Felecia Shelling  PTA Acute  Rehabilitation Services Pager      [phone] Office      [phone]

## 2019-04-10 NOTE — Progress Notes (Signed)
PROGRESS NOTE    Franklin Anderson  NKN:[id number] DOB: 1971-01-17 DOA: 02/21/2019 PCP: Mack Hook, MD    Brief Narrative: Franklin Anderson [phone] y.o.malewith medical history significant of rheumatic fever, HTN, ongoingIV heroin abuse, nicotine abuse, presented to the ED on 10/27 with 3 week course of progressively worsening back pain and BLE weakness associated with some bowel incontinence and trouble walking. Last use of heroin was on the day of admission. In ED > Found to have sepsis with Tm 100, HR 126, RR 31, WBC 22.4k,AKI with BUN 111 and creat 1.7. Imaging studies revealed osteomyelitis and diskitis of L4-L5,septic pulmonary emboli and psoas abscesses. He was started on IV antibiotics and admitted to [facility]. On 10/28 was found to be obtunded and dyspneic and was intubated. Blood cultures from admission grew out MSSA, subsequently found to have RV vegetation, left sided empyema (MSSA) and a left pneumothorax for which he received a chest tube.He also received a drain in his psoas abscess. Both chest tube and drain have been removed and ID has signed off with instructions to continue IV antibiotics until 12/16. Patient on multiple pain meds due to high pain threshold (IV drug abuse)  Assessment & Plan:   Principal Problem:   Sepsis ([place]) Active Problems:   Dyspnea   Heroin use   Endocarditis   Septic pulmonary embolism (HCC)   Osteomyelitis of thoracic spine (HCC)   Osteomyelitis of lumbar spine (HCC)   Acute encephalopathy   AKI (acute kidney injury) ([place])   Transaminitis   Psoas abscess (HCC)   Pressure injury of skin   Mucus clot in bronchi   Protein-calorie malnutrition, severe   Abnormal echocardiogram   MSSA bacteremia   Acute pulmonary embolism (HCC)   Pleural effusion on left   Empyema, left (HCC)   Chest tube in place   Pain   Pneumothorax on left   Fistula   Epidural abscess  Sepsis with MSSA bacteremia, L4-L5 osteomyelitis and discitis, epidural  abscess, psoas abscess, septic pulmonary emboli, empyema and right ventricular mural vegetation. Currently afebrile, with no leukocytosis. Right psoas abscess was aspirated on 10/28, culture positive for MSSA. Respiratory culture 10/28 also MSSA 2D echo 10/28 showed possible mitral valve vegetation and moderate pericardial effusion. Underwent TEE on 11/3 showed medium sized, mobile mass attached to the trabeculation in the RV and moderate sized pericardial effusion Per CT surgery, not a good candidate for thoracotomy MRI on 11/17 showed increasing psoas abscess s/p catheter drainage of right paraspinal abscess by IR on 11/18 ID recommended continue IV Ancef through 12/16 followed by Keflex for 30 days. Continue IV Ancef Pain management consists of Toradol, fentanyl patch, Flexeril, oral Dilaudid as needed and oral oxycodone prn for breakthrough pain. Monitor for narcotic seeking behavior  Acute respiratory failure with hypoxemia secondary to MSSA infection, septic pulmonary emboli, left empyema, left bronchopleural fistula (hydropneumothorax) Resolved, currently on RA Patient was intubated on 10/28-10/31 Underwent bronchoscopy with BAL, s/p left chest tube, removed on 11/17 Chest x-ray 12/2 showed bilateral pulmonary nodules, which are improved compared to prior exam on 11/19.  Small residual left apical pneumothorax  Moderate pericardial effusion Seen on 2D echo and TEE, treated conservatively and improving Last echo on 11/9 showed mild effusion (improvement).  Essential hypertension BP stable Continue Norvasc, metoprolol  Severe protein calorie malnutrition Continue dietary supplements  HCV positive Per ID, seems to have cleared  Pressure injury Sacrum with stage I, Right hip deep tissue injury Wound care per nursing   Mild hyperglycemai -  check a1c    Code Status: Full code DVT Prophylaxis:  Lovenox  Family Communication: None at bedside Disposition Plan: Remains  inpatient till completes IV antibiotics, completes on 12/16  Consultants:  Infectious disease, cardiology, palliative care, cardiothoracic surgery  Procedures:  TEE Intubation  Antimicrobials: Anti-infectives (From admission, onward)   Start     Dose/Rate Route Frequency Ordered Stop   02/24/19 2200  vancomycin (VANCOCIN) IVPB 1000 mg/200 mL premix  Status:  Discontinued     1,000 mg 200 mL/hr over 60 Minutes Intravenous Every 12 hours 02/24/19 1010 02/24/19 1253   02/24/19 1600  ceFAZolin (ANCEF) IVPB 2g/100 mL premix     2 g 200 mL/hr over 30 Minutes Intravenous Every 8 hours 02/24/19 1253 04/12/19 2359   02/23/19 2200  vancomycin (VANCOCIN) IVPB 750 mg/150 ml premix  Status:  Discontinued     750 mg 150 mL/hr over 60 Minutes Intravenous Every 24 hours 02/23/19 0853 02/23/19 0909   02/23/19 2200  vancomycin (VANCOCIN) IVPB 750 mg/150 ml premix  Status:  Discontinued     750 mg 150 mL/hr over 60 Minutes Intravenous Every 12 hours 02/23/19 0909 02/24/19 1010   02/22/19 2200  vancomycin (VANCOCIN) IVPB 750 mg/150 ml premix  Status:  Discontinued     750 mg 150 mL/hr over 60 Minutes Intravenous Every 24 hours 02/22/19 0826 02/22/19 0827   02/22/19 2200  vancomycin (VANCOCIN) IVPB 1000 mg/200 mL premix  Status:  Discontinued     1,000 mg 200 mL/hr over 60 Minutes Intravenous Every 24 hours 02/22/19 0827 02/23/19 0853   02/22/19 0900  ceFEPIme (MAXIPIME) 2 g in sodium chloride 0.9 % 100 mL IVPB  Status:  Discontinued     2 g 200 mL/hr over 30 Minutes Intravenous Every 12 hours 02/22/19 0826 02/23/19 1135   02/21/19 2015  ceFEPIme (MAXIPIME) 2 g in sodium chloride 0.9 % 100 mL IVPB     2 g 200 mL/hr over 30 Minutes Intravenous  Once 02/21/19 2013 02/22/19 0031   02/21/19 2015  metroNIDAZOLE (FLAGYL) IVPB 500 mg     500 mg 100 mL/hr over 60 Minutes Intravenous  Once 02/21/19 2013 02/22/19 0031   02/21/19 2015  vancomycin (VANCOCIN) IVPB 1000 mg/200 mL premix     1,000 mg 200 mL/hr  over 60 Minutes Intravenous  Once 02/21/19 2013 02/22/19 0200      Subjective: Patient was seen and examined at bedside. He complains of  Rt hip pain which seems to be at baseline, 7/10 but looks comfortable. Walks with walker denies any chest pain, shortness of breath,  abdominal pain, fever ,chills.  No overnight events.  Objective: Vitals:   04/10/19 0856 04/10/19 1052 04/10/19 1254 04/10/19 1611  BP: (!) 138/103 (!) 142/73 133/87 (!) 129/91  Pulse: (!) 102 88 93 (!) 103  Resp: _0 Temp: 97.8 F (36.6 C) 98.1 F (36.7 C) 97.9 F (36.6 C) 97.6 F (36.4 C)  TempSrc: Oral Oral Oral Oral  SpO2: 98% 98% 99% 98%  Weight:      Height:        Intake/Output Summary (Last 24 hours) at 04/10/2019 1840 Last data filed at 04/10/2019 1400 Gross per 24 hour  Intake 240 ml  Output 2250 ml  Net -2010 ml   Filed Weights   03/14/19 0500 03/15/19 0522 03/15/19 2000  Weight: 49.8 kg 49.9 kg 49.9 kg    Examination:  General exam: Appears in a lot of pain. Respiratory system: Clear to  auscultation. Respiratory effort normal. Cardiovascular system: S1 & S2 heard, RRR. No JVD, murmurs, rubs, gallops or clicks. No pedal edema. Gastrointestinal system: Abdomen is nondistended, soft and nontender. No organomegaly or masses felt. Normal bowel sounds heard. Central nervous system: Alert and oriented. No focal neurological deficits. Extremities :  no pedal edema, no calf tenderness. Skin: No rashes, lesions or ulcers Psychiatry: Judgement and insight appear normal. Mood & affect appropriate.     Data Reviewed: I have personally reviewed following labs and imaging studies  CBC: Recent Labs  Lab 04/05/19 0850 04/08/19 0910 04/10/19 1021  WBC 6.4 5.5 7.0  HGB 9.9* 10.6* 10.9*  HCT 31.7* 33.1* 34.6*  MCV 97.2 95.4 95.1  PLT 371 362 833   Basic Metabolic Panel: Recent Labs  Lab 04/05/19 0850 04/06/19 0630 04/10/19 1021  NA 138 138 137  K 4.4 4.3 3.8  CL 101 103 100    CO2 _0 GLUCOSE 93 86 186*  BUN 28* 20 17  CREATININE 0.49* 0.42* 0.46*  CALCIUM 9.2 9.0 9.5   GFR: Estimated Creatinine Clearance: 78.8 mL/min (A) (by C-G formula based on SCr of 0.46 mg/dL (L)). Liver Function Tests: No results for input(s): AST, ALT, ALKPHOS, BILITOT, PROT, ALBUMIN in the last 168 hours. No results for input(s): LIPASE, AMYLASE in the last 168 hours. No results for input(s): AMMONIA in the last 168 hours. Coagulation Profile: No results for input(s): INR, PROTIME in the last 168 hours. Cardiac Enzymes: No results for input(s): CKTOTAL, CKMB, CKMBINDEX, TROPONINI in the last 168 hours. BNP (last 3 results) No results for input(s): PROBNP in the last 8760 hours. HbA1C: No results for input(s): HGBA1C in the last 72 hours. CBG: Recent Labs  Lab 04/08/19 0751 04/08/19 1201 04/08/19 1609  GLUCAP 98 84 154*   Lipid Profile: No results for input(s): CHOL, HDL, LDLCALC, TRIG, CHOLHDL, LDLDIRECT in the last 72 hours. Thyroid Function Tests: No results for input(s): TSH, T4TOTAL, FREET4, T3FREE, THYROIDAB in the last 72 hours. Anemia Panel: No results for input(s): VITAMINB12, FOLATE, FERRITIN, TIBC, IRON, RETICCTPCT in the last 72 hours. Sepsis Labs: No results for input(s): PROCALCITON, LATICACIDVEN in the last 168 hours.  No results found for this or any previous visit (from the past 240 hour(s)).   Radiology Studies: No results found.  Scheduled Meds: . amLODipine  10 mg Oral Daily  . cyclobenzaprine  10 mg Oral TID  . enoxaparin (LOVENOX) injection  40 mg Subcutaneous Q24H  . feeding supplement (ENSURE ENLIVE)  237 mL Oral BID BM  . fentaNYL  1 patch Transdermal Q72H  . hydrochlorothiazide  25 mg Oral Daily  . lidocaine  1 patch Transdermal Q24H  . mouth rinse  15 mL Mouth Rinse BID  . metoprolol tartrate  100 mg Oral BID  . multivitamin with minerals  1 tablet Oral Daily  . pantoprazole  40 mg Oral Daily  . polyethylene glycol  17 g Oral  Daily   Continuous Infusions: . sodium chloride 1,000 mL (04/03/19 2141)  .  ceFAZolin (ANCEF) IV 2 g (04/10/19 1419)     LOS: 47 days    Time spent:   Vicenta Dunning, MD [facility]   If 7PM-7AM, please contact night-coverage

## 2019-04-11 LAB — CBC
HCT: 36.8 % — ABNORMAL LOW (ref 39.0–52.0)
Hemoglobin: 11.6 g/dL — ABNORMAL LOW (ref 13.0–17.0)
MCH: 29.7 pg (ref 26.0–34.0)
MCHC: 31.5 g/dL (ref 30.0–36.0)
MCV: 94.1 fL (ref 80.0–100.0)
Platelets: 374 10*3/uL (ref 150–400)
RBC: 3.91 MIL/uL — ABNORMAL LOW (ref 4.22–5.81)
RDW: 15.9 % — ABNORMAL HIGH (ref 11.5–15.5)
WBC: 7.6 10*3/uL (ref 4.0–10.5)
nRBC: 0 % (ref 0.0–0.2)

## 2019-04-11 LAB — BASIC METABOLIC PANEL
Anion gap: 12 (ref 5–15)
BUN: 24 mg/dL — ABNORMAL HIGH (ref 6–20)
CO2: 28 mmol/L (ref 22–32)
Calcium: 10.1 mg/dL (ref 8.9–10.3)
Chloride: 97 mmol/L — ABNORMAL LOW (ref 98–111)
Creatinine, Ser: 0.48 mg/dL — ABNORMAL LOW (ref 0.61–1.24)
GFR calc Af Amer: >60 mL/min (ref >60)
GFR calc non Af Amer: >60 mL/min (ref >60)
Glucose, Bld: 156 mg/dL — ABNORMAL HIGH (ref 70–99)
Potassium: 4.1 mmol/L (ref 3.5–5.1)
Sodium: 137 mmol/L (ref 135–145)

## 2019-04-11 LAB — HEMOGLOBIN A1C
Hgb A1c MFr Bld: 4.5 % — ABNORMAL LOW (ref 4.8–5.6)
Mean Plasma Glucose: 82.45 mg/dL

## 2019-04-11 LAB — MAGNESIUM: Magnesium: 1.9 mg/dL (ref 1.7–2.4)

## 2019-04-11 MED ORDER — GABAPENTIN 300 MG PO CAPS
300.0000 mg | ORAL_CAPSULE | Freq: Two times a day (BID) | ORAL | Status: DC
  Administered 2019-04-11 – 2019-04-12 (×2): 300 mg via ORAL
  Filled 2019-04-11 (×2): qty 1

## 2019-04-11 MED ORDER — HYDROMORPHONE HCL 2 MG PO TABS
2.0000 mg | ORAL_TABLET | Freq: Two times a day (BID) | ORAL | Status: DC | PRN
  Administered 2019-04-12: 07:00:00 2 mg via ORAL
  Filled 2019-04-11: qty 1

## 2019-04-11 NOTE — Progress Notes (Signed)
PROGRESS NOTE    Franklin Anderson  ALP:[id number] DOB: 1970/12/31 DOA: 02/21/2019 PCP: Mack Hook, MD    Brief Narrative: Franklin Anderson [phone] y.o.malewith medical history significant of rheumatic fever, HTN, ongoingIV heroin abuse, nicotine abuse, presented to the ED on 10/27 with 3 week course of progressively worsening back pain and BLE weakness associated with some bowel incontinence and trouble walking. Last use of heroin was on the day of admission. In ED > Found to have sepsis with Tm 100, HR 126, RR 31, WBC 22.4k,AKI with BUN 111 and creat 1.7. Imaging studies revealed osteomyelitis and diskitis of L4-L5,septic pulmonary emboli and psoas abscesses. He was started on IV antibiotics and admitted to [facility]. On 10/28 was found to be obtunded and dyspneic and was intubated. Blood cultures from admission grew out MSSA, subsequently found to have RV vegetation, left sided empyema (MSSA) and a left pneumothorax for which he received a chest tube.He also received a drain in his psoas abscess. Both chest tube and drain have been removed and ID has signed off with instructions to continue IV antibiotics until 12/16. Patient on multiple pain meds due to high pain threshold (IV drug abuse)  Assessment & Plan:   Principal Problem:   Sepsis ([place]) Active Problems:   Dyspnea   Heroin use   Endocarditis   Septic pulmonary embolism (HCC)   Osteomyelitis of thoracic spine (HCC)   Osteomyelitis of lumbar spine (HCC)   Acute encephalopathy   AKI (acute kidney injury) ([place])   Transaminitis   Psoas abscess (HCC)   Pressure injury of skin   Mucus clot in bronchi   Protein-calorie malnutrition, severe   Abnormal echocardiogram   MSSA bacteremia   Acute pulmonary embolism (HCC)   Pleural effusion on left   Empyema, left (HCC)   Chest tube in place   Pain   Pneumothorax on left   Fistula   Epidural abscess  Sepsis with MSSA bacteremia, L4-L5 osteomyelitis and discitis, epidural  abscess, psoas abscess, septic pulmonary emboli, empyema and right ventricular mural vegetation. Currently afebrile, with no leukocytosis. Right psoas abscess was aspirated on 10/28, culture positive for MSSA. Respiratory culture 10/28 also MSSA 2D echo 10/28 showed possible mitral valve vegetation and moderate pericardial effusion. Underwent TEE on 11/3 showed medium sized, mobile mass attached to the trabeculation in the RV and moderate sized pericardial effusion Per CT surgery, not a good candidate for thoracotomy MRI on 11/17 showed increasing psoas abscess s/p catheter drainage of right paraspinal abscess by IR on 11/18 ID recommended continue IV Ancef through 12/16 followed by Keflex for 30 days. Continue IV Ancef Pain management consists of Toradol, fentanyl patch, Flexeril, oral Dilaudid as needed and oral oxycodone prn for breakthrough pain. Monitor for narcotic seeking behavior  We will decrease Dilaudid to every 12 hours as needed and discontinue. Start Neurontin 300 mg twice a day to help with neuropathy pain  Outpatient follow-up with neurosurgery and pain clinic  Acute respiratory failure with hypoxemia secondary to MSSA infection, septic pulmonary emboli, left empyema, left bronchopleural fistula (hydropneumothorax) Resolved, currently on RA Patient was intubated on 10/28-10/31 Underwent bronchoscopy with BAL, s/p left chest tube, removed on 11/17 Chest x-ray 12/2 showed bilateral pulmonary nodules, which are improved compared to prior exam on 11/19.  Small residual left apical pneumothorax  Moderate pericardial effusion Seen on 2D echo and TEE, treated conservatively and improving Last echo on 11/9 showed mild effusion (improvement).  Essential hypertension BP stable Continue Norvasc, metoprolol  Severe protein calorie  malnutrition Continue dietary supplements  HCV positive Per ID, seems to have cleared  Pressure injury Sacrum with stage I, Right hip deep  tissue injury Wound care per nursing   Mild hyperglycemai - check a1c    Code Status: Full code DVT Prophylaxis:  Lovenox  Family Communication: None at bedside Disposition Plan: Remains inpatient till completes IV antibiotics, completes on 12/16  Consultants:  Infectious disease, cardiology, palliative care, cardiothoracic surgery  Procedures:  TEE Intubation  Antimicrobials: Anti-infectives (From admission, onward)   Start     Dose/Rate Route Frequency Ordered Stop   02/24/19 2200  vancomycin (VANCOCIN) IVPB 1000 mg/200 mL premix  Status:  Discontinued     1,000 mg 200 mL/hr over 60 Minutes Intravenous Every 12 hours 02/24/19 1010 02/24/19 1253   02/24/19 1600  ceFAZolin (ANCEF) IVPB 2g/100 mL premix     2 g 200 mL/hr over 30 Minutes Intravenous Every 8 hours 02/24/19 1253 04/12/19 2359   02/23/19 2200  vancomycin (VANCOCIN) IVPB 750 mg/150 ml premix  Status:  Discontinued     750 mg 150 mL/hr over 60 Minutes Intravenous Every 24 hours 02/23/19 0853 02/23/19 0909   02/23/19 2200  vancomycin (VANCOCIN) IVPB 750 mg/150 ml premix  Status:  Discontinued     750 mg 150 mL/hr over 60 Minutes Intravenous Every 12 hours 02/23/19 0909 02/24/19 1010   02/22/19 2200  vancomycin (VANCOCIN) IVPB 750 mg/150 ml premix  Status:  Discontinued     750 mg 150 mL/hr over 60 Minutes Intravenous Every 24 hours 02/22/19 0826 02/22/19 0827   02/22/19 2200  vancomycin (VANCOCIN) IVPB 1000 mg/200 mL premix  Status:  Discontinued     1,000 mg 200 mL/hr over 60 Minutes Intravenous Every 24 hours 02/22/19 0827 02/23/19 0853   02/22/19 0900  ceFEPIme (MAXIPIME) 2 g in sodium chloride 0.9 % 100 mL IVPB  Status:  Discontinued     2 g 200 mL/hr over 30 Minutes Intravenous Every 12 hours 02/22/19 0826 02/23/19 1135   02/21/19 2015  ceFEPIme (MAXIPIME) 2 g in sodium chloride 0.9 % 100 mL IVPB     2 g 200 mL/hr over 30 Minutes Intravenous  Once 02/21/19 2013 02/22/19 0031   02/21/19 2015  metroNIDAZOLE  (FLAGYL) IVPB 500 mg     500 mg 100 mL/hr over 60 Minutes Intravenous  Once 02/21/19 2013 02/22/19 0031   02/21/19 2015  vancomycin (VANCOCIN) IVPB 1000 mg/200 mL premix     1,000 mg 200 mL/hr over 60 Minutes Intravenous  Once 02/21/19 2013 02/22/19 0200      Subjective: Patient was seen and examined at bedside. He complains of  Rt hip pain which seems to be at baseline, 7/10 but looks comfortable. Walks with walker denies any chest pain, shortness of breath,  abdominal pain, fever ,chills.  No overnight events.  As per nursing staff, patient was not cooperative with physical therapy as he refused to work with them.  I have encouraged him to get some physical therapy.  I also discussed with him regarding substance abuse and to quit to which he verbalizes understanding.  Also discussed with him about weaning down on his pain medicines and finding a PCP, neurosurgeon and pain clinic for management of all his chronic conditions to which he is agreeable. Case management has arranged for an appointment with PCP in 1 week  Objective: Vitals:   04/10/19 1611 04/10/19 2023 04/11/19 0544 04/11/19 1409  BP: (!) 129/91 (!) 132/102 (!) 129/95 119/80  Pulse: Marland Kitchen)  103 (!) 109 (!) 101 (!) 104  Resp: _0 Temp: 97.6 F (36.4 C) 98.2 F (36.8 C) 97.7 F (36.5 C) 97.6 F (36.4 C)  TempSrc: Oral   Oral  SpO2: 98% 96% 95% 93%  Weight:      Height:        Intake/Output Summary (Last 24 hours) at 04/11/2019 1900 Last data filed at 04/11/2019 1500 Gross per 24 hour  Intake 2012.99 ml  Output 1100 ml  Net 912.99 ml   Filed Weights   03/14/19 0500 03/15/19 0522 03/15/19 2000  Weight: 49.8 kg 49.9 kg 49.9 kg    Examination:  General exam: Resting comfortable, not in acute distress Respiratory system: Clear to auscultation. Respiratory effort normal. Cardiovascular system: S1 & S2 heard, RRR. No JVD, murmurs, rubs, gallops or clicks. No pedal edema. Gastrointestinal system: Abdomen is  nondistended, soft and nontender. No organomegaly or masses felt. Normal bowel sounds heard. Central nervous system: Alert and oriented. No focal neurological deficits. Extremities :  no pedal edema, no calf tenderness. Skin: No rashes, lesions or ulcers Psychiatry: Judgement and insight appear normal. Mood & affect appropriate.   Right straight leg raising test positive subjectively as per patient Reproduction of back pain radiating to right limb  Data Reviewed: I have personally reviewed following labs and imaging studies  CBC: Recent Labs  Lab 04/05/19 0850 04/08/19 0910 04/10/19 1021 04/11/19 1002  WBC 6.4 5.5 7.0 7.6  HGB 9.9* 10.6* 10.9* 11.6*  HCT 31.7* 33.1* 34.6* 36.8*  MCV 97.2 95.4 95.1 94.1  PLT 371 362 355 654   Basic Metabolic Panel: Recent Labs  Lab 04/05/19 0850 04/06/19 0630 04/10/19 1021 04/11/19 1002  NA 138 138 137 137  K 4.4 4.3 3.8 4.1  CL 101 103 100 97*  CO2 _1 GLUCOSE 93 86 186* 156*  BUN 28* 20 17 24*  CREATININE 0.49* 0.42* 0.46* 0.48*  CALCIUM 9.2 9.0 9.5 10.1  MG  --   --   --  1.9   GFR: Estimated Creatinine Clearance: 78.8 mL/min (A) (by C-G formula based on SCr of 0.48 mg/dL (L)). Liver Function Tests: No results for input(s): AST, ALT, ALKPHOS, BILITOT, PROT, ALBUMIN in the last 168 hours. No results for input(s): LIPASE, AMYLASE in the last 168 hours. No results for input(s): AMMONIA in the last 168 hours. Coagulation Profile: No results for input(s): INR, PROTIME in the last 168 hours. Cardiac Enzymes: No results for input(s): CKTOTAL, CKMB, CKMBINDEX, TROPONINI in the last 168 hours. BNP (last 3 results) No results for input(s): PROBNP in the last 8760 hours. HbA1C: Recent Labs    04/11/19 1002  HGBA1C 4.5*   CBG: Recent Labs  Lab 04/08/19 0751 04/08/19 1201 04/08/19 1609  GLUCAP 98 84 154*   Lipid Profile: No results for input(s): CHOL, HDL, LDLCALC, TRIG, CHOLHDL, LDLDIRECT in the last 72  hours. Thyroid Function Tests: No results for input(s): TSH, T4TOTAL, FREET4, T3FREE, THYROIDAB in the last 72 hours. Anemia Panel: No results for input(s): VITAMINB12, FOLATE, FERRITIN, TIBC, IRON, RETICCTPCT in the last 72 hours. Sepsis Labs: No results for input(s): PROCALCITON, LATICACIDVEN in the last 168 hours.  No results found for this or any previous visit (from the past 240 hour(s)).   Radiology Studies: No results found.  Scheduled Meds: . amLODipine  10 mg Oral Daily  . cyclobenzaprine  10 mg Oral TID  . enoxaparin (LOVENOX) injection  40 mg Subcutaneous Q24H  .  feeding supplement (ENSURE ENLIVE)  237 mL Oral BID BM  . fentaNYL  1 patch Transdermal Q72H  . gabapentin  300 mg Oral BID  . hydrochlorothiazide  25 mg Oral Daily  . lidocaine  1 patch Transdermal Q24H  . mouth rinse  15 mL Mouth Rinse BID  . metoprolol tartrate  100 mg Oral BID  . multivitamin with minerals  1 tablet Oral Daily  . pantoprazole  40 mg Oral Daily  . polyethylene glycol  17 g Oral Daily   Continuous Infusions: . sodium chloride Stopped (04/09/19 1403)  .  ceFAZolin (ANCEF) IV 2 g (04/11/19 1242)     LOS: 48 days    Time spent:   Joanathan Affeldt Harmon Pier, MD [facility]   If 7PM-7AM, please contact night-coverage

## 2019-04-11 NOTE — Progress Notes (Signed)
Nutrition Follow-up  DOCUMENTATION CODES:   Severe malnutrition in context of social or environmental circumstances  INTERVENTION:   -Ensure Enlive po BID, each supplement provides 350 kcal and 20 grams of protein -Magic cup TID with meals, each supplement provides 290 kcal and 9 grams of protein -Multivitamin with minerals daily  -Recommend new weight measurement for admission (last recorded 11/18)  NUTRITION DIAGNOSIS:   Severe Malnutrition related to social / environmental circumstances(IVDU) as evidenced by severe fat depletion, severe muscle depletion.  Ongoing.  GOAL:   Patient will meet greater than or equal to 90% of their needs    MONITOR:   Diet advancement, Supplement acceptance, Weight trends, Skin, I & O's, Labs  REASON FOR ASSESSMENT:   Consult Assessment of nutrition requirement/status  ASSESSMENT:   48 y.o. male with medical history of IVDU, ongoing heroin use. Patient presented to the ED on 10/27 with 3 week history of progressively worsening back pain and BLE weakness.  Denies any urinary retention but has noticed some bowel incontinence. Stated that he has had increasing trouble walking and that he has lost weight. Last use of heroin was today (10/27).  10/28 - intubated 10/31 - extubated 11/03 - TEE 11/04 - s/p chest tube insertion for empyema and pneumothorax 11/17- Chest tube d/c 11/18 - s/pimage guided aspiration/possible drainageofright psoas abscess 11/27- right psoas drain removed via IR  Patient is currently consuming 100% of meals. Pt is drinking Ensure supplements as well. No further nutrition interventions needed at this time. Per MD note, pt awaiting completion of IV antibiotics prior to discharge. Last dose scheduled for 12/16.  Admission weight: 140 lbs. Last recorded weight from 11/18: 110 lbs.  I/Os: -8.5L since 12/1 UOP:  2.7L x 24 hrs  Medications: Multivitamin with minerals daily Labs reviewed: CBGs: 84-154  Diet  Order:   Diet Order            Diet regular Room service appropriate? Yes; Fluid consistency: Thin  Diet effective now              EDUCATION NEEDS:   No education needs have been identified at this time  Skin:  Skin Assessment: Skin Integrity Issues: Skin Integrity Issues:: Stage I, DTI DTI: rt hip Stage I: sacrum  Last BM:  12/13 -type 1  Height:   Ht Readings from Last 1 Encounters:  03/15/19 6' 0.01" (1.829 m)    Weight:   Wt Readings from Last 1 Encounters:  03/15/19 49.9 kg    Ideal Body Weight:  80.9 kg  BMI:  Body mass index is 14.92 kg/m.  Estimated Nutritional Needs:   Kcal:  2100-2300  Protein:  100-120 grams  Fluid:  >/= 2.0 L  Clayton Bibles, MS, RD, LDN Inpatient Clinical Dietitian Pager: [phone] After Hours Pager: [phone]

## 2019-04-11 NOTE — Progress Notes (Signed)
Physical Therapy Treatment Patient Details Name: Franklin Anderson MRN: [id number] DOB: 1971/02/27 Today's Date: 04/11/2019    History of Present Illness Sepsis - MSSA bacteremia with L4/L5 osteomyelitis and discitis, epidural abscess, psoas abscess, septic pulmonary emboli, empyema and right ventricular mural vegetation    PT Comments    Pt amb only a functional distance due to increased pain.  "Their kicking me out of here tomorrow" stated pt.   If D/C to home pt will need a wheelchair.    Follow Up Recommendations  SNF     Equipment Recommendations  Wheelchair (measurements PT)    Recommendations for Other Services       Precautions / Restrictions Precautions Precautions: Fall Precaution Comments: L4L5 osteo with R hip pain Restrictions Weight Bearing Restrictions: No Other Position/Activity Restrictions: WBAT    Mobility  Bed Mobility Overal bed mobility: Needs Assistance Bed Mobility: Supine to Sit;Sit to Supine     Supine to sit: Supervision;Min guard Sit to supine: Min guard;Min assist   General bed mobility comments: increased, increased time to attempt sitting EOB however unable to fully complete due to R HIP pain "deep" with "spasms"  Transfers Overall transfer level: Needs assistance Equipment used: Rolling walker (2 wheeled) Transfers: Sit to/from Stand Sit to Stand: From elevated surface;Supervision Stand pivot transfers: Supervision;Min guard       General transfer comment: increased, increased time to complete due to pain level.  Also assisted with BSC transfer.  Heavy use B UE's in attempt to decrease pain.  Ambulation/Gait Ambulation/Gait assistance: Supervision;Min guard Gait Distance (Feet): 145 Feet Assistive device: Rolling walker (2 wheeled) Gait Pattern/deviations: Decreased step length - right;Decreased step length - left;Step-through pattern;Decreased stride length Gait velocity: decreased   General Gait Details: tolerated a  functional distance   Optometrist    Modified Rankin (Stroke Patients Only)       Balance                                            Cognition Arousal/Alertness: Awake/alert Behavior During Therapy: WFL for tasks assessed/performed Overall Cognitive Status: Within Functional Limits for tasks assessed                                 General Comments: increased pain      Exercises      General Comments        Pertinent Vitals/Pain Pain Assessment: 0-10 Pain Score: 10-Worst pain ever Pain Location: R hip "deep" Pain Descriptors / Indicators: Discomfort;Sore;Aching;Guarding Pain Intervention(s): Monitored during session;Premedicated before session;Repositioned    Home Living                      Prior Function            PT Goals (current goals can now be found in the care plan section) Progress towards PT goals: Progressing toward goals    Frequency    Min 2X/week      PT Plan Current plan remains appropriate    Co-evaluation              AM-PAC PT "6 Clicks" Mobility   Outcome Measure  Help needed turning from your back to your side while in a flat  bed without using bedrails?: A Lot Help needed moving from lying on your back to sitting on the side of a flat bed without using bedrails?: A Lot Help needed moving to and from a bed to a chair (including a wheelchair)?: A Lot Help needed standing up from a chair using your arms (e.g., wheelchair or bedside chair)?: A Lot Help needed to walk in hospital room?: A Lot Help needed climbing 3-5 steps with a railing? : A Lot 6 Click Score: 12    End of Session Equipment Utilized During Treatment: Gait belt Activity Tolerance: Patient limited by pain Patient left: in bed;with call bell/phone within reach Nurse Communication: Mobility status PT Visit Diagnosis: Muscle weakness (generalized) (M62.81);Difficulty in walking, not  elsewhere classified (R26.2)     Time: 3546-5681 PT Time Calculation (min) (ACUTE ONLY): 27 min  Charges:  $Gait Training: 8-22 mins $Therapeutic Activity: 8-22 mins                     Franklin Anderson  PTA Acute  Rehabilitation Services Pager      [phone] Office      [phone]

## 2019-04-12 LAB — CBC
HCT: 38.1 % — ABNORMAL LOW (ref 39.0–52.0)
Hemoglobin: 11.8 g/dL — ABNORMAL LOW (ref 13.0–17.0)
MCH: 29.6 pg (ref 26.0–34.0)
MCHC: 31 g/dL (ref 30.0–36.0)
MCV: 95.7 fL (ref 80.0–100.0)
Platelets: 336 10*3/uL (ref 150–400)
RBC: 3.98 MIL/uL — ABNORMAL LOW (ref 4.22–5.81)
RDW: 15.8 % — ABNORMAL HIGH (ref 11.5–15.5)
WBC: 7 10*3/uL (ref 4.0–10.5)
nRBC: 0 % (ref 0.0–0.2)

## 2019-04-12 LAB — BASIC METABOLIC PANEL
Anion gap: 11 (ref 5–15)
BUN: 21 mg/dL — ABNORMAL HIGH (ref 6–20)
CO2: 26 mmol/L (ref 22–32)
Calcium: 10 mg/dL (ref 8.9–10.3)
Chloride: 99 mmol/L (ref 98–111)
Creatinine, Ser: 0.43 mg/dL — ABNORMAL LOW (ref 0.61–1.24)
GFR calc Af Amer: >60 mL/min (ref >60)
GFR calc non Af Amer: >60 mL/min (ref >60)
Glucose, Bld: 113 mg/dL — ABNORMAL HIGH (ref 70–99)
Potassium: 4.7 mmol/L (ref 3.5–5.1)
Sodium: 136 mmol/L (ref 135–145)

## 2019-04-12 MED ORDER — GABAPENTIN 300 MG PO CAPS
600.0000 mg | ORAL_CAPSULE | Freq: Two times a day (BID) | ORAL | Status: DC
  Administered 2019-04-12 – 2019-04-14 (×4): 600 mg via ORAL
  Filled 2019-04-12 (×4): qty 2

## 2019-04-12 NOTE — Progress Notes (Signed)
Physical Therapy Treatment Patient Details Name: Franklin Anderson MRN: [id number] DOB: January 04, 1971 Today's Date: 04/12/2019    History of Present Illness Sepsis - MSSA bacteremia with L4/L5 osteomyelitis and discitis, epidural abscess, psoas abscess, septic pulmonary emboli, empyema and right ventricular mural vegetation    PT Comments    Pt was amb last week well over 200 feet with walker.  This week has not been able due to increased pain.  Pt present with R hip radiating pain described as "dep", "sharpe" "intense" .  Pt was only able to transfer to recliner with much effort and obvious discomfort.  Clenching.  Grimacing.  Pt was unable to fully WB thru R LE despite several attempts.  Excessive lean on walker and unsteady with transfer.  Pt at end of his IV antibiotics and still is unable to functionally and safely mobilize.  Follow Up Recommendations  SNF     Equipment Recommendations  Wheelchair (measurements PT)    Recommendations for Other Services       Precautions / Restrictions Precautions Precautions: Fall Precaution Comments: L4L5 osteo with R hip pain Restrictions Weight Bearing Restrictions: No Other Position/Activity Restrictions: WBAT    Mobility  Bed Mobility         Supine to sit: Supervision;Min guard Sit to supine: Min assist   General bed mobility comments: required increased assist back to bed due to increased pain  Transfers Overall transfer level: Needs assistance Equipment used: Rolling walker (2 wheeled) Transfers: Sit to/from Stand Sit to Stand: Mod assist;Max assist Stand pivot transfers: Supervision;Min guard       General transfer comment: modA to powerup and for stability from elevated bed.  Assisted to recliner as pt was unable to amb in hallway due to pain.  Then I was unable to get him out of lower level recliner.  Required + 2 "pick up" assist to stand.  Transfered back to bed.  Ambulation/Gait             General Gait  Details: not able to amb due to pain   Stairs             Wheelchair Mobility    Modified Rankin (Stroke Patients Only)       Balance                                            Cognition Arousal/Alertness: Awake/alert Behavior During Therapy: WFL for tasks assessed/performed Overall Cognitive Status: Within Functional Limits for tasks assessed                                 General Comments: increased pain      Exercises      General Comments        Pertinent Vitals/Pain Pain Score: 10-Worst pain ever Pain Location: radiating R hip pain with mvmt Pain Descriptors / Indicators: Grimacing;Guarding;Moaning Pain Intervention(s): Monitored during session;Premedicated before session;Repositioned    Home Living                      Prior Function            PT Goals (current goals can now be found in the care plan section) Progress towards PT goals: Progressing toward goals    Frequency    Min 2X/week  PT Plan Current plan remains appropriate    Co-evaluation              AM-PAC PT "6 Clicks" Mobility   Outcome Measure  Help needed turning from your back to your side while in a flat bed without using bedrails?: A Lot Help needed moving from lying on your back to sitting on the side of a flat bed without using bedrails?: A Lot Help needed moving to and from a bed to a chair (including a wheelchair)?: A Lot Help needed standing up from a chair using your arms (e.g., wheelchair or bedside chair)?: A Lot Help needed to walk in hospital room?: A Lot Help needed climbing 3-5 steps with a railing? : Total 6 Click Score: 11    End of Session Equipment Utilized During Treatment: Gait belt Activity Tolerance: Patient limited by pain Patient left: in bed;with call bell/phone within reach Nurse Communication: Mobility status PT Visit Diagnosis: Muscle weakness (generalized) (M62.81);Difficulty in  walking, not elsewhere classified (R26.2)     Time: 1525-1550 PT Time Calculation (min) (ACUTE ONLY): 25 min  Charges:  $Therapeutic Activity: 23-37 mins                     Rica Koyanagi  PTA Acute  Rehabilitation Services Pager      [phone] Office      [phone]

## 2019-04-12 NOTE — Progress Notes (Signed)
Occupational Therapy Treatment Patient Details Name: Franklin Anderson MRN: [id number] DOB: 08/02/1970 Today's Date: 04/12/2019    History of present illness Sepsis - MSSA bacteremia with L4/L5 osteomyelitis and discitis, epidural abscess, psoas abscess, septic pulmonary emboli, empyema and right ventricular mural vegetation   OT comments  Pt progressing toward established OT goals. Pt is agreeable to OT session but is limited by pain. Pt completed grooming while sitting edge of bed. He tolerated standing for 3 min but required heavy reliance on bilateral upper extremity support on RW. Resting HR 90s, HR up to 129bpm with standing. Pt limited this session secondary to pain and tachy HR. Continue to recommend SNF level therapies at d/c. Should pt d/c home, he will need HHOT. Pt will continue to benefit from skilled OT services to maximize safety and independence with ADL/IADL and functional mobility. Will continue to follow acutely and progress as tolerated.    Follow Up Recommendations  SNF (HHOT if going home)   Equipment Recommendations  3 in 1 bedside commode    Recommendations for Other Services      Precautions / Restrictions Precautions Precautions: Fall Precaution Comments: L4L5 osteo with R hip pain Restrictions Weight Bearing Restrictions: No Other Position/Activity Restrictions: WBAT       Mobility Bed Mobility Overal bed mobility: Needs Assistance Bed Mobility: Rolling;Sidelying to Sit;Sit to Supine Rolling: Min guard Sidelying to sit: Min assist   Sit to supine: Min guard   General bed mobility comments: minA for BLE management with progressing to EOB;pt able to return to supine with minguard  Transfers Overall transfer level: Needs assistance Equipment used: Rolling walker (2 wheeled) Transfers: Sit to/from Stand;Lateral/Scoot Transfers Sit to Stand: Mod assist        Lateral/Scoot Transfers: Mod assist General transfer comment: modA to powerup and for  stability in standing;lateral side stepping along edge of bed toward head of bed    Balance Overall balance assessment: Needs assistance Sitting-balance support: No upper extremity supported;Single extremity supported;Feet supported Sitting balance-Leahy Scale: Fair Sitting balance - Comments: intermittent single UE support while sitting EOB   Standing balance support: Bilateral upper extremity supported;During functional activity Standing balance-Leahy Scale: Poor Standing balance comment: heavy reliance on BUE support in standing;unable to let go of stability                           ADL either performed or assessed with clinical judgement   ADL Overall ADL's : Needs assistance/impaired     Grooming: Set up;Sitting;Wash/dry face;Oral care;Applying deodorant Grooming Details (indicate cue type and reason): completed sitting EOB Upper Body Bathing: Set up;Sitting   Lower Body Bathing: Moderate assistance;Sit to/from stand Lower Body Bathing Details (indicate cue type and reason): modA to powerup into standing     Lower Body Dressing: Moderate assistance;Sit to/from stand Lower Body Dressing Details (indicate cue type and reason): donned socks long sitting in bed;modA to powerup into standing Toilet Transfer: Moderate assistance;RW Toilet Transfer Details (indicate cue type and reason): modA for stability, side step along edge of bed toward [facility] Toileting- Clothing Manipulation and Hygiene: Moderate assistance;Sit to/from stand       Functional mobility during ADLs: Moderate assistance;Rolling walker General ADL Comments: pt with significant increase in pain with mobility;resting HR 90s, HR 129 with standing     Vision       Perception     Praxis      Cognition Arousal/Alertness: Awake/alert Behavior During Therapy: WFL for  tasks assessed/performed Overall Cognitive Status: Within Functional Limits for tasks assessed                                           Exercises     Shoulder Instructions       General Comments HR up to 129 with mobility    Pertinent Vitals/ Pain       Pain Assessment: Faces Faces Pain Scale: Hurts whole lot Pain Location: R hip "deep" Pain Descriptors / Indicators: Grimacing;Guarding;Moaning Pain Intervention(s): Limited activity within patient's tolerance;Monitored during session  Home Living                                          Prior Functioning/Environment              Frequency  Min 2X/week        Progress Toward Goals  OT Goals(current goals can now be found in the care plan section)  Progress towards OT goals: Progressing toward goals  Acute Rehab OT Goals Patient Stated Goal: To get stronger and make better decisions OT Goal Formulation: With patient Time For Goal Achievement: 04/20/19 Potential to Achieve Goals: Good ADL Goals Pt Will Perform Upper Body Dressing: with set-up;sitting Pt Will Perform Lower Body Dressing: with min guard assist;sit to/from stand;with adaptive equipment Pt Will Transfer to Toilet: with min guard assist;ambulating;bedside commode Pt/caregiver will Perform Home Exercise Program: Increased strength;Both right and left upper extremity;With theraband;With minimal assist;With written HEP provided Additional ADL Goal #1: pt will tolerate standing 3 minutes for adls  Plan Discharge plan remains appropriate    Co-evaluation                 AM-PAC OT "6 Clicks" Daily Activity     Outcome Measure   Help from another person eating meals?: None Help from another person taking care of personal grooming?: A Little Help from another person toileting, which includes using toliet, bedpan, or urinal?: A Lot Help from another person bathing (including washing, rinsing, drying)?: A Little Help from another person to put on and taking off regular upper body clothing?: A Little Help from another person to put on and taking  off regular lower body clothing?: A Lot 6 Click Score: 17    End of Session Equipment Utilized During Treatment: Gait belt;Rolling walker  OT Visit Diagnosis: Unsteadiness on feet (R26.81);Muscle weakness (generalized) (M62.81);Pain Pain - Right/Left: Right Pain - part of body: Shoulder;Hip   Activity Tolerance Patient limited by pain;Patient tolerated treatment well   Patient Left in bed;with call bell/phone within reach   Nurse Communication Mobility status;Patient requests pain meds        Time: 5400-8676 OT Time Calculation (min): 25 min  Charges: OT General Charges $OT Visit: 1 Visit OT Treatments $Self Care/Home Management : 23-37 mins  Diona Browner OTR/L Acute Rehabilitation Services Office: [phone]    Rebeca Alert 04/12/2019, 11:30 AM

## 2019-04-12 NOTE — Progress Notes (Addendum)
PROGRESS NOTE    Franklin Anderson  EHM:[id number] DOB: 08/14/1970 DOA: 02/21/2019 PCP: Mack Hook, MD    Brief Narrative: Franklin Anderson [phone] y.o.malewith medical history significant of rheumatic fever, HTN, ongoingIV heroin abuse, nicotine abuse, presented to the ED on 10/27 with 3 week course of progressively worsening back pain and BLE weakness associated with some bowel incontinence and trouble walking. Last use of heroin was on the day of admission. In ED > Found to have sepsis with Tm 100, HR 126, RR 31, WBC 22.4k,AKI with BUN 111 and creat 1.7. Imaging studies revealed osteomyelitis and diskitis of L4-L5,septic pulmonary emboli and psoas abscesses. He was started on IV antibiotics and admitted to [facility]. On 10/28 was found to be obtunded and dyspneic and was intubated. Blood cultures from admission grew out MSSA, subsequently found to have RV vegetation, left sided empyema (MSSA) and a left pneumothorax for which he received a chest tube.He also received a drain in his psoas abscess. Both chest tube and drain have been removed and ID has signed off with instructions to continue IV antibiotics until 12/16. 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 - POA  Currently afebrile, with no leukocytosis. Right psoas abscess was aspirated on 10/28, culture positive for MSSA. Respiratory culture 10/28 also MSSA 2D echo 10/28 showed possible mitral valve vegetation and moderate pericardial effusion. Underwent TEE on 11/3 showed medium sized, mobile mass attached to the trabeculation in the RV and moderate sized pericardial effusion Per CT surgery, not a good candidate for thoracotomy MRI on 11/17 showed increasing psoas abscess s/p catheter drainage of right paraspinal abscess by IR on 11/18 ID recommended continue IV Ancef through 12/16 followed by Keflex for 30 days. Continue IV Ancef Pain management consists of Toradol, fentanyl patch, Flexeril, oral Dilaudid as needed and oral oxycodone prn for breakthrough pain. Monitor for narcotic seeking behavior  Dilaudid discontinued on 12/16 . Increase  Neurontin 600 mg twice a day to help with neuropathy pain  Outpatient follow-up with neurosurgery and pain clinic  Acute respiratory failure with hypoxemia secondary to MSSA infection, septic pulmonary emboli, left empyema, left bronchopleural fistula (hydropneumothorax) Resolved, currently on RA Patient was intubated on 10/28-10/31 Underwent bronchoscopy with BAL, s/p left chest tube, removed on 11/17 Chest x-ray 12/2 showed bilateral pulmonary nodules, which are improved compared to prior exam on 11/19.  Small residual left apical pneumothorax  Moderate pericardial effusion Seen on 2D echo and TEE, treated conservatively and improving Last echo on 11/9 showed mild effusion (improvement).  Essential hypertension BP stable Continue Norvasc, metoprolol  Severe protein calorie malnutrition Continue dietary  supplements  HCV positive Per ID, seems to have cleared  Pressure injury POA  Sacrum with stage I, Right hip deep tissue injury Wound  care per nursing  Acute  Septic  Encephalopathy due to bactermia POA  - resolved    Mild hyperglycemai -  a1c  4.5  Reactive tachycardia - from underlying endocarditis, pain . cx to monitor    Code Status: Full code DVT Prophylaxis:  Lovenox  Family Communication: None at bedside Disposition Plan: Remains inpatient till completes IV antibiotics, completes on 12/16  Consultants:  Infectious disease, cardiology, palliative care, cardiothoracic surgery  Procedures:  TEE Intubation  Antimicrobials: Anti-infectives (From admission, onward)   Start     Dose/Rate Route Frequency Ordered Stop   02/24/19 2200  vancomycin (VANCOCIN) IVPB 1000 mg/200 mL premix  Status:  Discontinued     1,000 mg 200 mL/hr over 60 Minutes Intravenous Every 12 hours 02/24/19 1010 02/24/19 1253   02/24/19 1600  ceFAZolin (ANCEF) IVPB 2g/100 mL premix     2 g 200 mL/hr over 30 Minutes Intravenous Every 8 hours 02/24/19 1253 04/12/19 2359   02/23/19 2200  vancomycin (VANCOCIN) IVPB 750 mg/150 ml premix  Status:  Discontinued     750 mg 150 mL/hr over 60 Minutes Intravenous Every 24 hours 02/23/19 0853 02/23/19 0909   02/23/19 2200  vancomycin (VANCOCIN) IVPB 750 mg/150 ml premix  Status:  Discontinued     750 mg 150 mL/hr over 60 Minutes Intravenous Every 12 hours 02/23/19 0909 02/24/19 1010   02/22/19 2200  vancomycin (VANCOCIN) IVPB 750 mg/150 ml premix  Status:  Discontinued     750 mg 150 mL/hr over 60 Minutes Intravenous Every 24 hours 02/22/19 0826 02/22/19 0827   02/22/19 2200  vancomycin (VANCOCIN) IVPB 1000 mg/200 mL premix  Status:  Discontinued     1,000 mg 200 mL/hr over 60 Minutes Intravenous Every 24 hours 02/22/19 0827 02/23/19 0853   02/22/19 0900  ceFEPIme (MAXIPIME) 2 g in sodium chloride 0.9 % 100 mL IVPB  Status:  Discontinued     2 g 200 mL/hr over 30 Minutes Intravenous Every 12 hours 02/22/19 0826 02/23/19 1135   02/21/19 2015  ceFEPIme (MAXIPIME) 2 g in sodium chloride 0.9 %  100 mL IVPB     2 g 200 mL/hr over 30 Minutes Intravenous  Once 02/21/19 2013 02/22/19 0031   02/21/19 2015  metroNIDAZOLE (FLAGYL) IVPB 500 mg     500 mg 100 mL/hr over 60 Minutes Intravenous  Once 02/21/19 2013 02/22/19 0031   02/21/19 2015  vancomycin (VANCOCIN) IVPB 1000 mg/200 mL premix     1,000 mg 200 mL/hr over 60 Minutes Intravenous  Once 02/21/19 2013 02/22/19 0200      Subjective: Patient was seen and examined at bedside. He complains of  Rt hip pain which seems to be at baseline, 7/10 but looks comfortable.denies any chest pain, shortness of breath,  abdominal pain, fever ,chills.  No overnight events.  neurontin helped him somewhat .  As per nursing staff, patient was not cooperative with physical therapy as he refused to work with them.  I have encouraged him to get some physical therapy.  I also discussed with him regarding substance abuse and to quit to which he verbalizes understanding.  Also discussed with him about weaning down on his pain medicines and finding a PCP, neurosurgeon and pain clinic for management of all his chronic conditions to which he is agreeable. Case management has arranged for an appointment  with PCP in 1 week  Family is requesting for some time to arrange his home on ground floor so that he can live there which includes access, heat,,water etc.  Objective: Vitals:   04/11/19 2251 04/12/19 0539 04/12/19 1024 04/12/19 1025  BP: 137/90 (!) 129/99 (!) 131/101   Pulse: (!) 114 92  (!) 110  Resp:  19    Temp:  98 F (36.7 C)    TempSrc:  Oral    SpO2:  95%    Weight:      Height:        Intake/Output Summary (Last 24 hours) at 04/12/2019 1320 Last data filed at 04/12/2019 1200 Gross per 24 hour  Intake 560 ml  Output 2625 ml  Net -2065 ml   Filed Weights   03/14/19 0500 03/15/19 0522 03/15/19 2000  Weight: 49.8 kg 49.9 kg 49.9 kg    Examination:  General exam: Resting comfortable, not in acute distress Respiratory system: Clear  to auscultation. Respiratory effort normal. Cardiovascular system: S1 & S2 heard, RRR. No JVD, murmurs, rubs, gallops or clicks. No pedal edema. Gastrointestinal system: Abdomen is nondistended, soft and nontender. No organomegaly or masses felt. Normal bowel sounds heard. Central nervous system: Alert and oriented. No focal neurological deficits. Extremities :  no pedal edema, no calf tenderness. Skin: No rashes, lesions or ulcers Psychiatry: Judgement and insight appear normal. Mood & affect appropriate.   Right straight leg raising test positive subjectively as per patient Reproduction of back pain radiating to right limb  Data Reviewed: I have personally reviewed following labs and imaging studies  CBC: Recent Labs  Lab 04/08/19 0910 04/10/19 1021 04/11/19 1002 04/12/19 0643  WBC 5.5 7.0 7.6 7.0  HGB 10.6* 10.9* 11.6* 11.8*  HCT 33.1* 34.6* 36.8* 38.1*  MCV 95.4 95.1 94.1 95.7  PLT 362 355 374 563   Basic Metabolic Panel: Recent Labs  Lab 04/06/19 0630 04/10/19 1021 04/11/19 1002 04/12/19 0643  NA 138 137 137 136  K 4.3 3.8 4.1 4.7  CL 103 100 97* 99  CO2 _0 GLUCOSE 86 186* 156* 113*  BUN 20 17 24* 21*  CREATININE 0.42* 0.46* 0.48* 0.43*  CALCIUM 9.0 9.5 10.1 10.0  MG  --   --  1.9  --    GFR: Estimated Creatinine Clearance: 78.8 mL/Anderson (A) (by C-G formula based on SCr of 0.43 mg/dL (L)). Liver Function Tests: No results for input(s): AST, ALT, ALKPHOS, BILITOT, PROT, ALBUMIN in the last 168 hours. No results for input(s): LIPASE, AMYLASE in the last 168 hours. No results for input(s): AMMONIA in the last 168 hours. Coagulation Profile: No results for input(s): INR, PROTIME in the last 168 hours. Cardiac Enzymes: No results for input(s): CKTOTAL, CKMB, CKMBINDEX, TROPONINI in the last 168 hours. BNP (last 3 results) No results for input(s): PROBNP in the last 8760 hours. HbA1C: Recent Labs    04/11/19 1002  HGBA1C 4.5*   CBG: Recent Labs    Lab 04/08/19 0751 04/08/19 1201 04/08/19 1609  GLUCAP 98 84 154*   Lipid Profile: No results for input(s): CHOL, HDL, LDLCALC, TRIG, CHOLHDL, LDLDIRECT in the last 72 hours. Thyroid Function Tests: No results for input(s): TSH, T4TOTAL, FREET4, T3FREE, THYROIDAB in the last 72 hours. Anemia Panel: No results for input(s): VITAMINB12, FOLATE, FERRITIN, TIBC, IRON, RETICCTPCT in the last 72 hours. Sepsis Labs: No results for input(s): PROCALCITON, LATICACIDVEN in the last 168 hours.  No results found for this or any  previous visit (from the past 240 hour(s)).   Radiology Studies: No results found.  Scheduled Meds: . amLODipine  10 mg Oral Daily  . cyclobenzaprine  10 mg Oral TID  . enoxaparin (LOVENOX) injection  40 mg Subcutaneous Q24H  . feeding supplement (ENSURE ENLIVE)  237 mL Oral BID BM  . fentaNYL  1 patch Transdermal Q72H  . gabapentin  600 mg Oral BID  . hydrochlorothiazide  25 mg Oral Daily  . lidocaine  1 patch Transdermal Q24H  . mouth rinse  15 mL Mouth Rinse BID  . metoprolol tartrate  100 mg Oral BID  . multivitamin with minerals  1 tablet Oral Daily  . pantoprazole  40 mg Oral Daily  . polyethylene glycol  17 g Oral Daily   Continuous Infusions: . sodium chloride 250 mL (04/12/19 0830)  .  ceFAZolin (ANCEF) IV 2 g (04/12/19 0636)     LOS: 49 days    Time spent:   Vicenta Dunning, MD [facility]   If 7PM-7AM, please contact night-coverage

## 2019-04-13 ENCOUNTER — Inpatient Hospital Stay (HOSPITAL_COMMUNITY): Payer: Self-pay

## 2019-04-13 DIAGNOSIS — R29898 Other symptoms and signs involving the musculoskeletal system: Secondary | ICD-10-CM

## 2019-04-13 LAB — BASIC METABOLIC PANEL
Anion gap: 11 (ref 5–15)
BUN: 24 mg/dL — ABNORMAL HIGH (ref 6–20)
CO2: 29 mmol/L (ref 22–32)
Calcium: 10.1 mg/dL (ref 8.9–10.3)
Chloride: 97 mmol/L — ABNORMAL LOW (ref 98–111)
Creatinine, Ser: 0.42 mg/dL — ABNORMAL LOW (ref 0.61–1.24)
GFR calc Af Amer: >60 mL/min (ref >60)
GFR calc non Af Amer: >60 mL/min (ref >60)
Glucose, Bld: 115 mg/dL — ABNORMAL HIGH (ref 70–99)
Potassium: 4.3 mmol/L (ref 3.5–5.1)
Sodium: 137 mmol/L (ref 135–145)

## 2019-04-13 LAB — CBC
HCT: 34.9 % — ABNORMAL LOW (ref 39.0–52.0)
Hemoglobin: 11 g/dL — ABNORMAL LOW (ref 13.0–17.0)
MCH: 29.8 pg (ref 26.0–34.0)
MCHC: 31.5 g/dL (ref 30.0–36.0)
MCV: 94.6 fL (ref 80.0–100.0)
Platelets: 392 10*3/uL (ref 150–400)
RBC: 3.69 MIL/uL — ABNORMAL LOW (ref 4.22–5.81)
RDW: 15.4 % (ref 11.5–15.5)
WBC: 6.2 10*3/uL (ref 4.0–10.5)
nRBC: 0 % (ref 0.0–0.2)

## 2019-04-13 IMAGING — MR MR LUMBAR SPINE WO/W CM
4 of 8 series · 18 of 48 positions shown · IV contrast (gadavist)
Comparison: MRIs of the lumbar spine dated [DATE] [DATE]
and CT scan of the lumbar spine dated [DATE]

CLINICAL DATA: Two

EXAM:
MRI LUMBAR SPINE WITHOUT AND WITH CONTRAST
TECHNIQUE: Multiplanar and multiecho pulse sequences of the lumbar spine were
obtained without and with intravenous contrast.
CONTRAST:  5mL GADAVIST GADOBUTROL 1 MMOL/ML IV SOLN

[Series 4: T1 · sagittal · 4.0mm · 0.53mm/px · 3 of 12 slices shown]
[im 1/12]
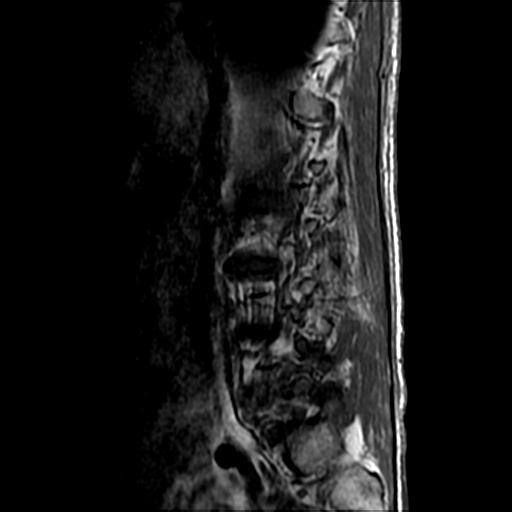
[im 6/12]
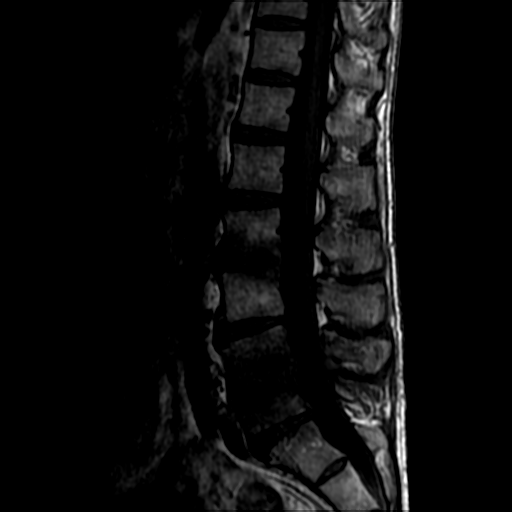
[im 12/12]
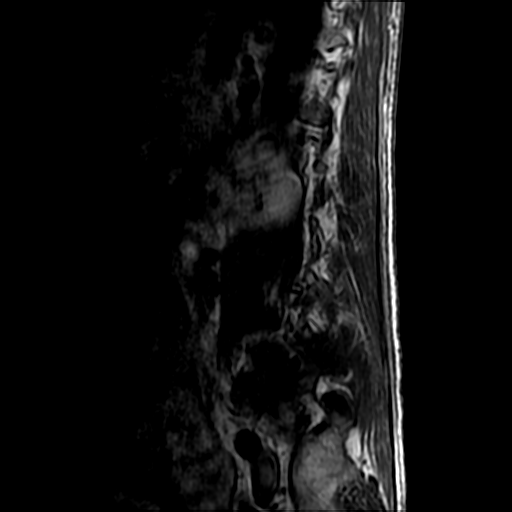

[Series 5: T2 post-contrast · sagittal · 4.0mm · 0.53mm/px · 3 of 12 slices shown (1 of 2)]
[im 1/12]
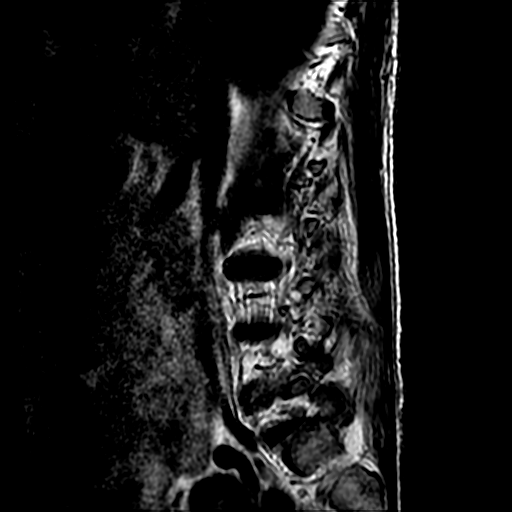
[im 6/12]
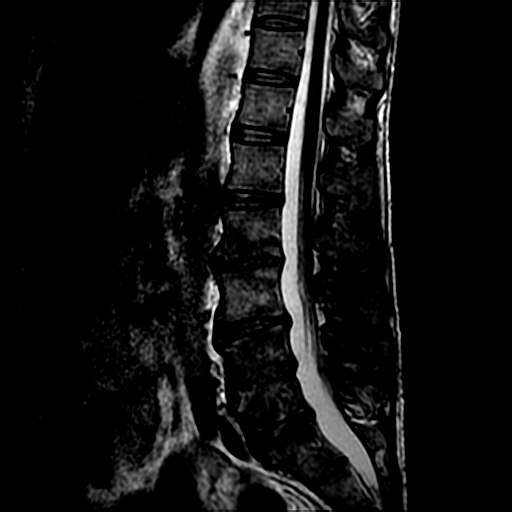
[im 12/12]
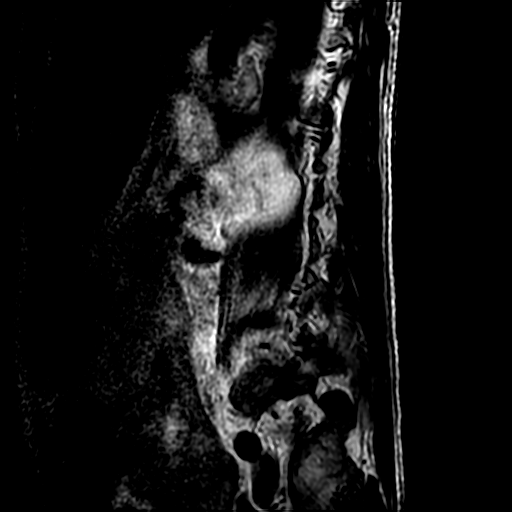

[Series 7: T2 · axial · 4.0mm · 0.39mm/px · z∈[-199,-29]mm · 9 of 39 slices shown]
[im 1/39]
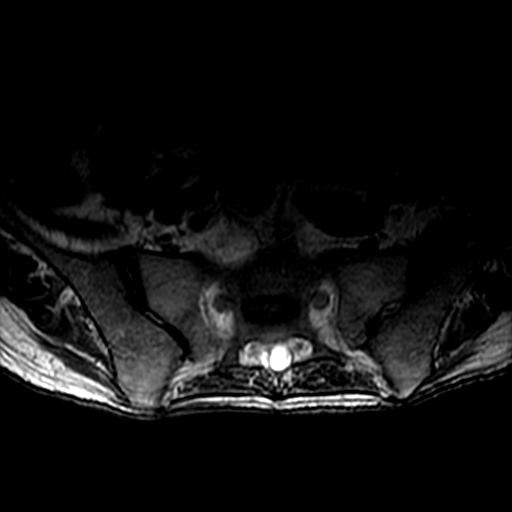
[im 4/39]
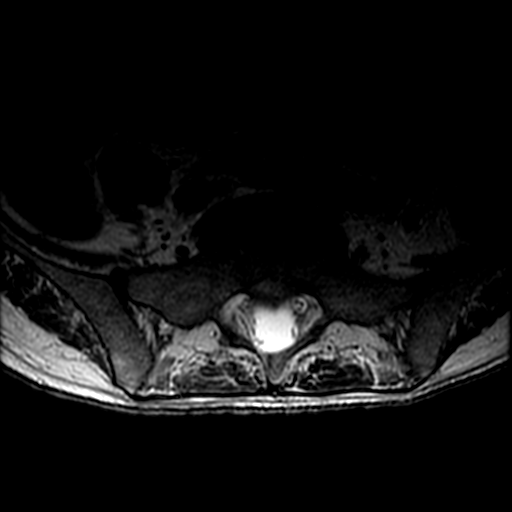
[im 8/39]
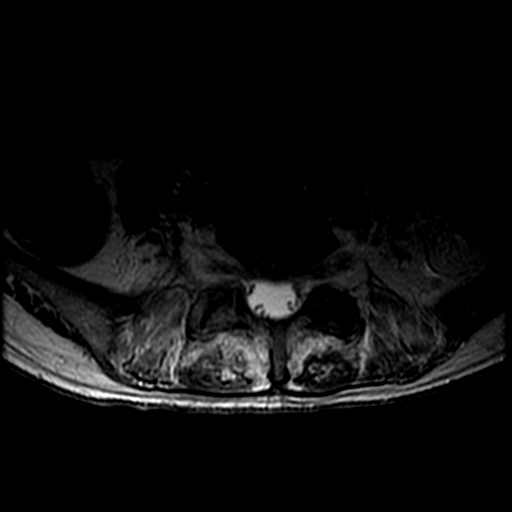
[im 12/39]
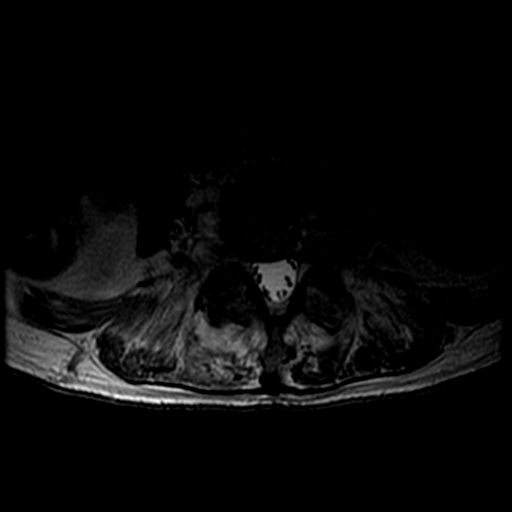
[im 16/39]
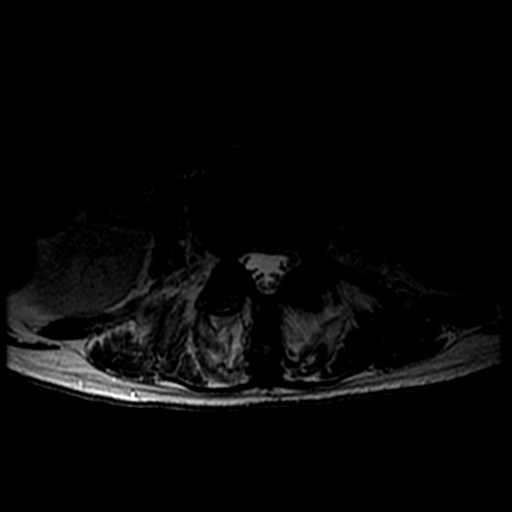
[im 20/39]
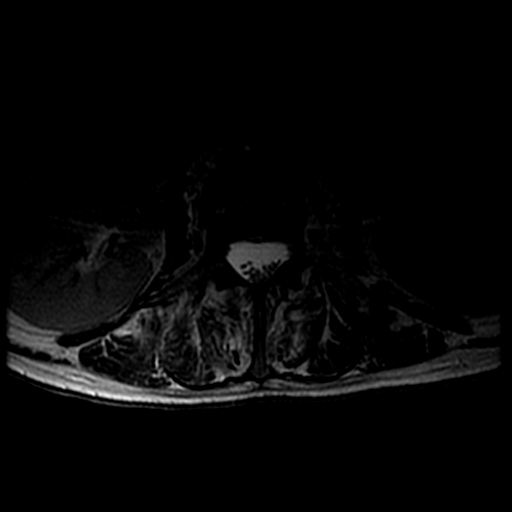
[im 23/39]
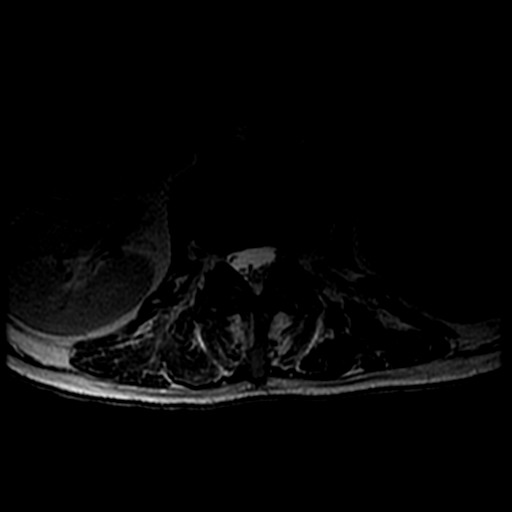
[im 27/39]
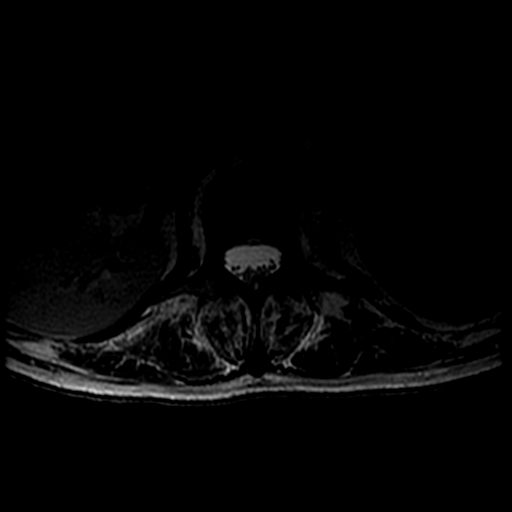
[im 35/39]
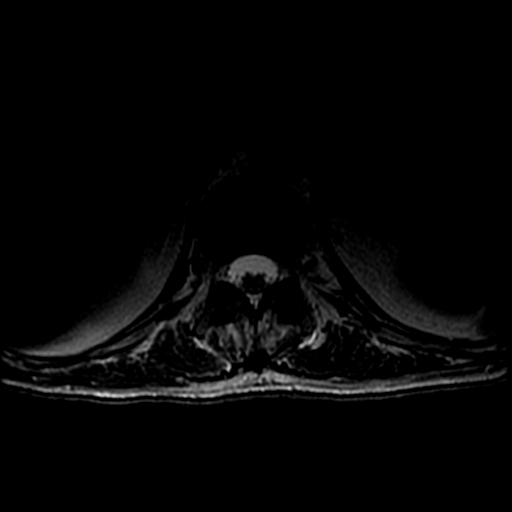

[Series 9: T2 post-contrast · sagittal · 4.0mm · 0.53mm/px · 3 of 12 slices shown (2 of 2)]
[im 1/12]
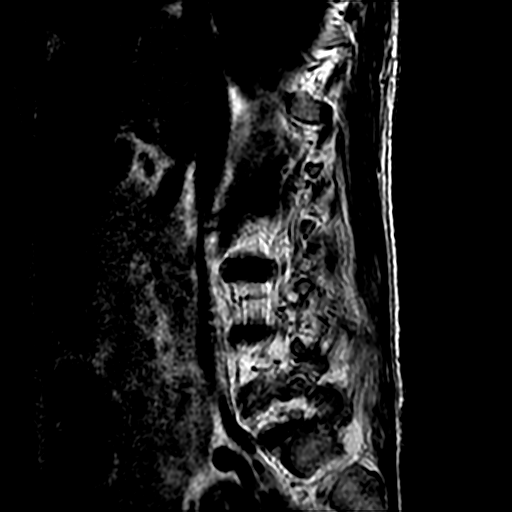
[im 6/12]
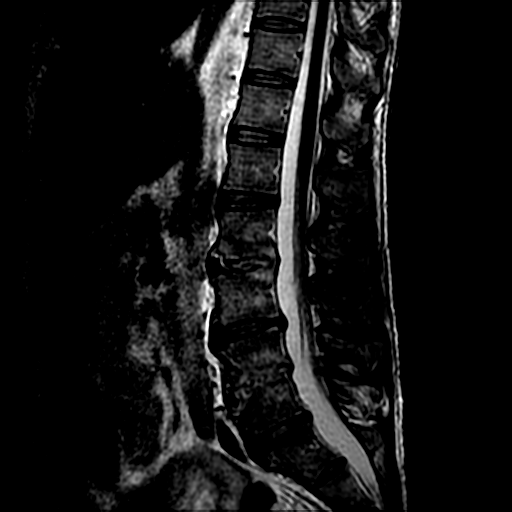
[im 12/12]
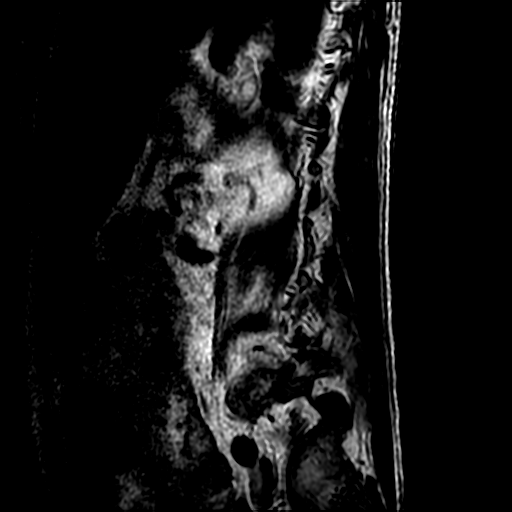

[18 of 48 positions shown; findings below may reference images not displayed]

FINDINGS: Segmentation:  Standard.

Alignment:  Physiologic.

Vertebrae: Again noted is osteomyelitis discitis at L2-3 and L4-5.
See below discussion.

Conus medullaris and cauda equina: Conus extends to the L1-2 level.
Conus and cauda equina appear normal.

Paraspinal and other soft tissues: There is edema deep to the psoas
muscles bilaterally from L2-3 through L4-5 to the level of the L5-S1
disc space. Small right paraspinal abscess at L4-5 is smaller than
on the prior study.

Disc levels:

T12-L1 and L1-2: No significant.

L2-3: Persistent enhancement of the posterior aspect of the disc
space and of the vertebral endplates. No epidural abscess. No
significant change since the prior study.

L3-4: Disc protrusion to the left of midline with a small disc bulge
to the right of midline, unchanged.

L4-5: Discitis and osteomyelitis are again noted at L4-5 pus in the
disc space and extending slightly anterior to the vertebral bodies
just to the right of midline, slightly diminished since the prior
study. There is edema and abnormal enhancement of both facet joints
consistent with osteomyelitis and septic arthritis of those facet
joints. There is increased enhancement of inferior facets of L4
bilaterally as compared to the prior study. There is abnormal
enhancement in both neural foramina at L4-5, unchanged.

L5-S1: Tiny broad-based disc bulge without neural impingement,
unchanged.
IMPRESSION: 1. Slight decrease in the size of the right paraspinal abscess at
L4-5 since the prior study.
2. Increased enhancement of the inferior facets of L4 bilaterally
consistent with osteomyelitis and septic arthritis of those facet
joints.
3. No epidural abscess.
4. Slightly pus in the L4-5 disc space since the prior study.
5. No change in the small disc protrusion at L2-3 without neural
impingement.
6. No new neural impingement since the prior study.

## 2019-04-13 MED ORDER — GADOBUTROL 1 MMOL/ML IV SOLN
5.0000 mL | Freq: Once | INTRAVENOUS | Status: AC | PRN
  Administered 2019-04-13: 5 mL via INTRAVENOUS

## 2019-04-13 MED ORDER — CEPHALEXIN 500 MG PO CAPS
500.0000 mg | ORAL_CAPSULE | Freq: Every day | ORAL | Status: DC
  Administered 2019-04-13 – 2019-04-14 (×2): 500 mg via ORAL
  Filled 2019-04-13 (×2): qty 1

## 2019-04-13 MED ORDER — LORAZEPAM 2 MG/ML IJ SOLN
2.0000 mg | Freq: Once | INTRAMUSCULAR | Status: AC
  Administered 2019-04-13: 20:00:00 2 mg via INTRAVENOUS
  Filled 2019-04-13: qty 1

## 2019-04-13 NOTE — Progress Notes (Signed)
Physical Therapy Treatment Patient Details Name: Franklin Anderson MRN: [id number] DOB: [birth year] Today's Date: 04/13/2019    History of Present Illness Sepsis - MSSA bacteremia with L4/L5 osteomyelitis and discitis, epidural abscess, psoas abscess, septic pulmonary emboli, empyema and right ventricular mural vegetation    PT Comments    Pt not progressing with his mobility due to pain.  Plans are for D/C tomorrow.  IV antibiotics are completed.   Follow Up Recommendations  (pt now D/C to home due to no SNF offers)     Equipment Recommendations  Wheelchair (measurements PT)    Recommendations for Other Services       Precautions / Restrictions Precautions Precautions: Fall Precaution Comments: L4L5 osteo with R hip pain Restrictions Weight Bearing Restrictions: No Other Position/Activity Restrictions: WBAT    Mobility  Bed Mobility Overal bed mobility: Needs Assistance Bed Mobility: Rolling;Sidelying to Sit;Sit to Supine Rolling: Min guard   Supine to sit: Supervision;Min guard Sit to supine: Supervision;Min guard Sit to sidelying: Min assist General bed mobility comments: required increased assist back to bed due to increased pain  Transfers Overall transfer level: Needs assistance Equipment used: Rolling walker (2 wheeled) Transfers: Sit to/from Stand Sit to Stand: Min guard;Min assist         General transfer comment: excessive WBing thru B UE's to power up with much effort and increased sharpe, deep, radiating pain  Ambulation/Gait Ambulation/Gait assistance: Mod assist;Max assist Gait Distance (Feet): 5 Feet Assistive device: Rolling walker (2 wheeled) Gait Pattern/deviations: Decreased step length - right;Decreased step length - left;Step-through pattern;Decreased stride length Gait velocity: decreased   General Gait Details: not able to amb > 5 feet due to pain   Stairs             Wheelchair Mobility    Modified Rankin (Stroke Patients  Only)       Balance                                            Cognition Arousal/Alertness: Awake/alert Behavior During Therapy: WFL for tasks assessed/performed Overall Cognitive Status: Within Functional Limits for tasks assessed                                        Exercises      General Comments        Pertinent Vitals/Pain Pain Assessment: 0-10 Pain Score: 10-Worst pain ever Pain Location: radiating R hip pain with mvmt Pain Descriptors / Indicators: Grimacing;Guarding;Moaning;Stabbing;Sharp Pain Intervention(s): Monitored during session;Repositioned    Home Living                      Prior Function            PT Goals (current goals can now be found in the care plan section) Progress towards PT goals: Progressing toward goals    Frequency    Min 2X/week      PT Plan Current plan remains appropriate    Co-evaluation              AM-PAC PT "6 Clicks" Mobility   Outcome Measure  Help needed turning from your back to your side while in a flat bed without using bedrails?: A Little Help needed moving from lying on your back to  sitting on the side of a flat bed without using bedrails?: A Lot Help needed moving to and from a bed to a chair (including a wheelchair)?: A Lot Help needed standing up from a chair using your arms (e.g., wheelchair or bedside chair)?: A Lot Help needed to walk in hospital room?: A Lot Help needed climbing 3-5 steps with a railing? : Total 6 Click Score: 12    End of Session Equipment Utilized During Treatment: Gait belt Activity Tolerance: Patient limited by pain Patient left: in bed;with call bell/phone within reach Nurse Communication: Mobility status PT Visit Diagnosis: Muscle weakness (generalized) (M62.81);Difficulty in walking, not elsewhere classified (R26.2)     Time: 9629-5284 PT Time Calculation (min) (ACUTE ONLY): 15 min  Charges:  $Gait Training: 8-22  mins                     Rica Koyanagi  PTA Acute  Rehabilitation Services Pager      [phone] Office      [phone]

## 2019-04-13 NOTE — Progress Notes (Signed)
PROGRESS NOTE    Franklin Anderson  HQP:[id number] DOB: 1971/03/09 DOA: 02/21/2019 PCP: Mack Hook, MD    Brief Narrative: Franklin Anderson [phone] y.o.malewith medical history significant of rheumatic fever, HTN, ongoingIV heroin abuse, nicotine abuse, presented to the ED on 10/27 with 3 week course of progressively worsening back pain and BLE weakness associated with some bowel incontinence and trouble walking. Last use of heroin was on the day of admission. In ED > Found to have sepsis with Tm 100, HR 126, RR 31, WBC 22.4k,AKI with BUN 111 and creat 1.7. Imaging studies revealed osteomyelitis and diskitis of L4-L5,septic pulmonary emboli and psoas abscesses. He was started on IV antibiotics and admitted to [facility]. On 10/28 was found to be obtunded and dyspneic and was intubated. Blood cultures from admission grew out MSSA, subsequently found to have RV vegetation, left sided empyema (MSSA) and a left pneumothorax for which he received a chest tube.He also received a drain in his psoas abscess. Both chest tube and drain have been removed and ID has signed off with instructions to continue IV antibiotics until 12/16. 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 - POA  Currently afebrile, with no leukocytosis. Right psoas abscess was aspirated on 10/28, culture positive for MSSA. Respiratory culture 10/28 also MSSA 2D echo 10/28 showed possible mitral valve vegetation and moderate pericardial effusion. Underwent TEE on 11/3 showed medium sized, mobile mass attached to the trabeculation in the RV and moderate sized pericardial effusion Per CT surgery, not a good candidate for thoracotomy MRI on 11/17 showed increasing psoas abscess s/p catheter drainage of right paraspinal abscess by IR on 11/18 ID recommended continue IV Ancef through 12/16 followed by Keflex for 30 days. Continue IV Ancef Pain management consists of Toradol, fentanyl patch, Flexeril, oral Dilaudid as needed and oral oxycodone prn for breakthrough pain. Monitor for narcotic seeking behavior  Dilaudid discontinued on 12/16 . Increase  Neurontin 600 mg twice a day to help with neuropathy pain  Outpatient follow-up with neurosurgery and pain clinic  Repeat MRI on 12/17 to make sure there is no worsening of his underlying discitis/abscess.   Acute respiratory failure with hypoxemia secondary to MSSA infection, septic pulmonary emboli, left empyema, left bronchopleural fistula (hydropneumothorax) Resolved, currently on RA Patient was intubated on 10/28-10/31 Underwent bronchoscopy with BAL, s/p left chest tube, removed on 11/17 Chest x-ray 12/2 showed bilateral pulmonary nodules, which are improved compared to prior exam on 11/19.  Small residual left apical pneumothorax  Moderate pericardial effusion Seen on 2D echo and TEE, treated conservatively and improving Last echo on 11/9 showed mild  effusion (improvement).  Essential hypertension BP stable Continue Norvasc, metoprolol  Severe protein calorie malnutrition Continue dietary supplements  HCV positive Per ID, seems to have  cleared  Pressure injury POA  Sacrum with stage I, Right hip deep tissue injury Wound care per nursing  Acute  Septic  Encephalopathy due to bactermia POA  - resolved    Mild hyperglycemai -  a1c  4.5  Reactive tachycardia - from underlying endocarditis, pain . cx to monitor    Code Status: Full code DVT Prophylaxis:  Lovenox  Family Communication: None at bedside Disposition Plan: Remains inpatient till completes IV antibiotics, completes on 12/16  Consultants:  Infectious disease, cardiology, palliative care, cardiothoracic surgery  Procedures:  TEE Intubation  Antimicrobials: Anti-infectives (From admission, onward)   Start     Dose/Rate Route Frequency Ordered Stop   04/13/19 1230  cephALEXin (KEFLEX) capsule 500 mg     500 mg Oral Daily 04/13/19 1146     02/24/19 2200  vancomycin (VANCOCIN) IVPB 1000 mg/200 mL premix  Status:  Discontinued     1,000 mg 200 mL/hr over 60 Minutes Intravenous Every 12 hours 02/24/19 1010 02/24/19 1253   02/24/19 1600  ceFAZolin (ANCEF) IVPB 2g/100 mL premix     2 g 200 mL/hr over 30 Minutes Intravenous Every 8 hours 02/24/19 1253 04/12/19 2234   02/23/19 2200  vancomycin (VANCOCIN) IVPB 750 mg/150 ml premix  Status:  Discontinued     750 mg 150 mL/hr over 60 Minutes Intravenous Every 24 hours 02/23/19 0853 02/23/19 0909   02/23/19 2200  vancomycin (VANCOCIN) IVPB 750 mg/150 ml premix  Status:  Discontinued     750 mg 150 mL/hr over 60 Minutes Intravenous Every 12 hours 02/23/19 0909 02/24/19 1010   02/22/19 2200  vancomycin (VANCOCIN) IVPB 750 mg/150 ml premix  Status:  Discontinued     750 mg 150 mL/hr over 60 Minutes Intravenous Every 24 hours 02/22/19 0826 02/22/19 0827   02/22/19 2200  vancomycin (VANCOCIN) IVPB 1000 mg/200 mL premix  Status:  Discontinued     1,000 mg 200 mL/hr over 60 Minutes Intravenous Every 24 hours 02/22/19 0827 02/23/19 0853   02/22/19 0900  ceFEPIme (MAXIPIME) 2 g in sodium chloride 0.9 % 100 mL IVPB   Status:  Discontinued     2 g 200 mL/hr over 30 Minutes Intravenous Every 12 hours 02/22/19 0826 02/23/19 1135   02/21/19 2015  ceFEPIme (MAXIPIME) 2 g in sodium chloride 0.9 % 100 mL IVPB     2 g 200 mL/hr over 30 Minutes Intravenous  Once 02/21/19 2013 02/22/19 0031   02/21/19 2015  metroNIDAZOLE (FLAGYL) IVPB 500 mg     500 mg 100 mL/hr over 60 Minutes Intravenous  Once 02/21/19 2013 02/22/19 0031   02/21/19 2015  vancomycin (VANCOCIN) IVPB 1000 mg/200 mL premix     1,000 mg 200 mL/hr over 60 Minutes Intravenous  Once 02/21/19 2013 02/22/19 0200      Subjective: Patient was seen and examined at bedside. He complains of  Rt hip pain which seems to be at baseline, 7/10 but looks comfortable.denies any chest pain, shortness of breath,  abdominal pain, fever ,chills.  No overnight events.  He is unable to work with physical therapy due to worsening of his right hip pain as per him along with right leg weakness.  MRI ordered today for further eval.  neurontin helped him somewhat .  As per nursing staff, patient was not cooperative with physical therapy as he  refused to work with them.  I have encouraged him to get some physical therapy.  I also discussed with him regarding substance abuse and to quit to which he verbalizes understanding.  Also discussed with him about weaning down on his pain medicines and finding a PCP, neurosurgeon and pain clinic for management of all his chronic conditions to which he is agreeable. Case management has arranged for an appointment with PCP in 1 week  Family is requesting for some time to arrange his home on ground floor so that he can live there which includes access, heat,,water etc.  Objective: Vitals:   04/12/19 1541 04/12/19 1950 04/13/19 0444 04/13/19 1343  BP: (!) 130/93 138/90 (!) 137/93 129/84  Pulse: (!) 106 (!) 101 91 94  Resp:  _0 Temp:  (!) 97.3 F (36.3 C) 98.3 F (36.8 C) 98.2 F (36.8 C)  TempSrc:  Oral Oral Oral  SpO2:  97% 96% 97% 97%  Weight:      Height:        Intake/Output Summary (Last 24 hours) at 04/13/2019 1905 Last data filed at 04/13/2019 1430 Gross per 24 hour  Intake --  Output 2050 ml  Net -2050 ml   Filed Weights   03/14/19 0500 03/15/19 0522 03/15/19 2000  Weight: 49.8 kg 49.9 kg 49.9 kg    Examination:  General exam: Resting comfortable, not in acute distress Respiratory system: Clear to auscultation. Respiratory effort normal. Cardiovascular system: S1 & S2 heard, RRR. No JVD, murmurs, rubs, gallops or clicks. No pedal edema. Gastrointestinal system: Abdomen is nondistended, soft and nontender. No organomegaly or masses felt. Normal bowel sounds heard. Central nervous system: Alert and oriented. No focal neurological deficits. Extremities :  no pedal edema, no calf tenderness. Skin: No rashes, lesions or ulcers Psychiatry: Judgement and insight appear normal. Mood & affect appropriate.   Right straight leg raising test positive subjectively as per patient Decreased rt leg strength.  Reproduction of back pain radiating to right limb  Data Reviewed: I have personally reviewed following labs and imaging studies  CBC: Recent Labs  Lab 04/08/19 0910 04/10/19 1021 04/11/19 1002 04/12/19 0643 04/13/19 0603  WBC 5.5 7.0 7.6 7.0 6.2  HGB 10.6* 10.9* 11.6* 11.8* 11.0*  HCT 33.1* 34.6* 36.8* 38.1* 34.9*  MCV 95.4 95.1 94.1 95.7 94.6  PLT 362 355 374 336 169   Basic Metabolic Panel: Recent Labs  Lab 04/10/19 1021 04/11/19 1002 04/12/19 0643 04/13/19 0603  NA 137 137 136 137  K 3.8 4.1 4.7 4.3  CL 100 97* 99 97*  CO2 _1 GLUCOSE 186* 156* 113* 115*  BUN 17 24* 21* 24*  CREATININE 0.46* 0.48* 0.43* 0.42*  CALCIUM 9.5 10.1 10.0 10.1  MG  --  1.9  --   --    GFR: Estimated Creatinine Clearance: 78.8 mL/min (A) (by C-G formula based on SCr of 0.42 mg/dL (L)). Liver Function Tests: No results for input(s): AST, ALT, ALKPHOS, BILITOT, PROT, ALBUMIN in the  last 168 hours. No results for input(s): LIPASE, AMYLASE in the last 168 hours. No results for input(s): AMMONIA in the last 168 hours. Coagulation Profile: No results for input(s): INR, PROTIME in the last 168 hours. Cardiac Enzymes: No results for input(s): CKTOTAL, CKMB, CKMBINDEX, TROPONINI in the last 168 hours. BNP (last 3 results) No results for input(s): PROBNP in the last 8760 hours. HbA1C: Recent Labs    04/11/19 1002  HGBA1C 4.5*   CBG: Recent  Labs  Lab 04/08/19 0751 04/08/19 1201 04/08/19 1609  GLUCAP 98 84 154*   Lipid Profile: No results for input(s): CHOL, HDL, LDLCALC, TRIG, CHOLHDL, LDLDIRECT in the last 72 hours. Thyroid Function Tests: No results for input(s): TSH, T4TOTAL, FREET4, T3FREE, THYROIDAB in the last 72 hours. Anemia Panel: No results for input(s): VITAMINB12, FOLATE, FERRITIN, TIBC, IRON, RETICCTPCT in the last 72 hours. Sepsis Labs: No results for input(s): PROCALCITON, LATICACIDVEN in the last 168 hours.  No results found for this or any previous visit (from the past 240 hour(s)).   Radiology Studies: No results found.  Scheduled Meds: . amLODipine  10 mg Oral Daily  . cephALEXin  500 mg Oral Daily  . cyclobenzaprine  10 mg Oral TID  . enoxaparin (LOVENOX) injection  40 mg Subcutaneous Q24H  . feeding supplement (ENSURE ENLIVE)  237 mL Oral BID BM  . fentaNYL  1 patch Transdermal Q72H  . gabapentin  600 mg Oral BID  . hydrochlorothiazide  25 mg Oral Daily  . lidocaine  1 patch Transdermal Q24H  . LORazepam  2 mg Intravenous Once  . mouth rinse  15 mL Mouth Rinse BID  . metoprolol tartrate  100 mg Oral BID  . multivitamin with minerals  1 tablet Oral Daily  . pantoprazole  40 mg Oral Daily  . polyethylene glycol  17 g Oral Daily   Continuous Infusions: . sodium chloride Stopped (04/13/19 0915)     LOS: 50 days    Time spent:   Athaliah Baumbach Harmon Pier, MD [facility]   If 7PM-7AM, please contact night-coverage

## 2019-04-13 NOTE — Clinical Social Work Note (Addendum)
    Durable Medical Equipment  (From admission, onward)         Start     Ordered   04/13/19 1029  For home use only DME lightweight manual wheelchair with seat cushion  Once    Comments: Patient suffers from  Chronic back and rt hip pain which impairs their ability to perform daily activities like bathing, dressing, feeding and grooming in the home.  A walker will not resolve  issue with performing activities of daily living. A wheelchair will allow patient to safely perform daily activities. Patient is not able to propel themselves in the home using a standard weight wheelchair due to endurance and general weakness. Patient can self propel in the lightweight wheelchair. Length of need Lifetime. Accessories: elevating leg rests (ELRs), wheel locks, extensions and anti-tippers.   04/13/19 1028   04/12/19 1057  For home use only DME wheelchair cushion (seat and back)  (Wheelchairs)  Once     04/12/19 1058

## 2019-04-14 LAB — BASIC METABOLIC PANEL
Anion gap: 7 (ref 5–15)
BUN: 28 mg/dL — ABNORMAL HIGH (ref 6–20)
CO2: 31 mmol/L (ref 22–32)
Calcium: 9.9 mg/dL (ref 8.9–10.3)
Chloride: 100 mmol/L (ref 98–111)
Creatinine, Ser: 0.49 mg/dL — ABNORMAL LOW (ref 0.61–1.24)
GFR calc Af Amer: >60 mL/min (ref >60)
GFR calc non Af Amer: >60 mL/min (ref >60)
Glucose, Bld: 90 mg/dL (ref 70–99)
Potassium: 4.2 mmol/L (ref 3.5–5.1)
Sodium: 138 mmol/L (ref 135–145)

## 2019-04-14 LAB — CBC
HCT: 33.8 % — ABNORMAL LOW (ref 39.0–52.0)
Hemoglobin: 10.6 g/dL — ABNORMAL LOW (ref 13.0–17.0)
MCH: 29.8 pg (ref 26.0–34.0)
MCHC: 31.4 g/dL (ref 30.0–36.0)
MCV: 94.9 fL (ref 80.0–100.0)
Platelets: 369 10*3/uL (ref 150–400)
RBC: 3.56 MIL/uL — ABNORMAL LOW (ref 4.22–5.81)
RDW: 15.2 % (ref 11.5–15.5)
WBC: 6.2 10*3/uL (ref 4.0–10.5)
nRBC: 0 % (ref 0.0–0.2)

## 2019-04-14 MED ORDER — CEPHALEXIN 500 MG PO CAPS: 500.0000 mg | ORAL_CAPSULE | Freq: Four times a day (QID) | ORAL | 0 refills | Status: DC

## 2019-04-14 MED ORDER — LIDOCAINE 5 % EX PTCH: 1.0000 | MEDICATED_PATCH | CUTANEOUS | 0 refills | Status: DC

## 2019-04-14 MED ORDER — HYDROCHLOROTHIAZIDE 25 MG PO TABS: 25.0000 mg | ORAL_TABLET | Freq: Every day | ORAL | 0 refills | Status: DC

## 2019-04-14 MED ORDER — GABAPENTIN 300 MG PO CAPS: 600.0000 mg | ORAL_CAPSULE | Freq: Two times a day (BID) | ORAL | 0 refills | Status: DC

## 2019-04-14 MED ORDER — METOPROLOL TARTRATE 100 MG PO TABS: 100.0000 mg | ORAL_TABLET | Freq: Two times a day (BID) | ORAL | 0 refills | Status: DC

## 2019-04-14 MED ORDER — AMLODIPINE BESYLATE 10 MG PO TABS: 10.0000 mg | ORAL_TABLET | Freq: Every day | ORAL | 0 refills | Status: DC

## 2019-04-14 MED ORDER — IPRATROPIUM-ALBUTEROL 0.5-2.5 (3) MG/3ML IN SOLN: 3.0000 mL | Freq: Four times a day (QID) | RESPIRATORY_TRACT | 0 refills | Status: DC | PRN

## 2019-04-14 MED ORDER — DICLOFENAC SODIUM 50 MG PO TBEC: 50.0000 mg | DELAYED_RELEASE_TABLET | Freq: Two times a day (BID) | ORAL | 0 refills | Status: AC

## 2019-04-14 MED ORDER — ACETAMINOPHEN 325 MG PO TABS: 650.0000 mg | ORAL_TABLET | Freq: Four times a day (QID) | ORAL | 0 refills | Status: DC | PRN

## 2019-04-14 MED ORDER — DICLOFENAC SODIUM 50 MG PO TBEC
50.0000 mg | DELAYED_RELEASE_TABLET | Freq: Two times a day (BID) | ORAL | Status: DC
  Filled 2019-04-14: qty 1

## 2019-04-14 MED ORDER — SENNOSIDES-DOCUSATE SODIUM 8.6-50 MG PO TABS: 2.0000 | ORAL_TABLET | Freq: Every evening | ORAL | 0 refills | Status: DC | PRN

## 2019-04-14 MED ORDER — PANTOPRAZOLE SODIUM 40 MG PO TBEC: 40.0000 mg | DELAYED_RELEASE_TABLET | Freq: Every day | ORAL | 0 refills | Status: DC

## 2019-04-14 MED ORDER — POLYETHYLENE GLYCOL 3350 17 G PO PACK: 17.0000 g | PACK | Freq: Every day | ORAL | 0 refills | Status: DC

## 2019-04-14 MED ORDER — FENTANYL 50 MCG/HR TD PT72: 1.0000 | MEDICATED_PATCH | TRANSDERMAL | 0 refills | Status: AC

## 2019-04-14 MED ORDER — CLONAZEPAM 0.5 MG PO TABS: 0.5000 mg | ORAL_TABLET | Freq: Three times a day (TID) | ORAL | 0 refills | Status: DC | PRN

## 2019-04-14 MED ORDER — ONDANSETRON HCL 4 MG PO TABS: 4.0000 mg | ORAL_TABLET | Freq: Four times a day (QID) | ORAL | 0 refills | Status: DC | PRN

## 2019-04-14 MED ORDER — ENSURE ENLIVE PO LIQD: 237.0000 mL | Freq: Two times a day (BID) | ORAL | 12 refills | Status: DC

## 2019-04-14 MED ORDER — OXYCODONE HCL 5 MG PO TABS: 5.0000 mg | ORAL_TABLET | Freq: Four times a day (QID) | ORAL | 0 refills | Status: AC | PRN

## 2019-04-14 MED ORDER — CYCLOBENZAPRINE HCL 10 MG PO TABS: 10.0000 mg | ORAL_TABLET | Freq: Three times a day (TID) | ORAL | 0 refills | Status: AC | PRN

## 2019-04-14 NOTE — Progress Notes (Signed)
Occupational Therapy Treatment Patient Details Name: Franklin Anderson MRN: [id number] DOB: [birth year] Today's Date: 04/14/2019    History of present illness Sepsis - MSSA bacteremia with L4/L5 osteomyelitis and discitis, epidural abscess, psoas abscess, septic pulmonary emboli, empyema and right ventricular mural vegetation   OT comments  Pt progressing slow but steady towards acute OT goals. Limited by 8/10 (faces scale) pain in low back and RLE>LLE, premedicated. Pt reports d/c plan is home today. Pt's w/c present in room. Session focused on practicing transfers to/from w/c. Discussed toilet and shower transfer options from w/c level.     Follow Up Recommendations  SNF;Supervision/Assistance - 24 hour(noted that plan is now home)    Equipment Recommendations  3 in 1 bedside commode;Tub/shower bench    Recommendations for Other Services      Precautions / Restrictions Precautions Precautions: Fall Precaution Comments: L4L5 osteo with R hip pain Restrictions Weight Bearing Restrictions: No Other Position/Activity Restrictions: WBAT       Mobility Bed Mobility Overal bed mobility: Needs Assistance Bed Mobility: Rolling;Sidelying to Sit;Sit to Supine Rolling: Min guard Sidelying to sit: Min guard   Sit to supine: Min guard;Min assist   General bed mobility comments: assist to powerup RLE onto bed at end of session otherwise min guard for safety  Transfers Overall transfer level: Needs assistance Equipment used: Rolling walker (2 wheeled) Transfers: Sit to/from [facility] Sit to Stand: Min guard;Min assist Stand pivot transfers: Min guard       General transfer comment: heavy offloading onto BUE to decrease RLE>LLE pain that increases with WBing. 1x min A to steady on intial stand. Minguard for safety. Increased pain in standing and during transitions.    Balance Overall balance assessment: Needs assistance Sitting-balance support: No upper extremity  supported;Single extremity supported;Feet supported Sitting balance-Leahy Scale: Fair Sitting balance - Comments: utilizing BUE for back/RLE>LLE pain relief   Standing balance support: Bilateral upper extremity supported;During functional activity Standing balance-Leahy Scale: Poor Standing balance comment: heavy reliance on BUE support of rw to offload as RLE>LLE weightbearing increasing pain. Shaky and mild LOBs but able to self-correct, Improved a bit with repitition. Min guard for safety                           ADL either performed or assessed with clinical judgement   ADL Overall ADL's : Needs assistance/impaired                                       General ADL Comments: Practiced bed mobility amd transfers EOB <> w/c. Pt with w/c in room that he will use at home. Oriented to w/c controls. Pt self-propelled household distances with fair toleration. Much less observable pain once seated in w/c vs standing with rw.     Vision       Perception     Praxis      Cognition Arousal/Alertness: Awake/alert Behavior During Therapy: WFL for tasks assessed/performed Overall Cognitive Status: Within Functional Limits for tasks assessed                                 General Comments: at times internally distracted by pain        Exercises     Shoulder Instructions       General Comments  Pertinent Vitals/ Pain       Pain Assessment: Faces Faces Pain Scale: Hurts whole lot Pain Location: radiating R hip pain with mvmt Pain Descriptors / Indicators: Grimacing;Guarding;Moaning;Stabbing;Sharp Pain Intervention(s): Monitored during session;Repositioned;Limited activity within patient's tolerance;Premedicated before session  Home Living                                          Prior Functioning/Environment              Frequency  Min 2X/week        Progress Toward Goals  OT Goals(current goals can  now be found in the care plan section)  Progress towards OT goals: Progressing toward goals  Acute Rehab OT Goals Patient Stated Goal: To get stronger and make better decisions OT Goal Formulation: With patient Time For Goal Achievement: 04/20/19 Potential to Achieve Goals: Good ADL Goals Pt Will Perform Upper Body Dressing: with set-up;sitting Pt Will Perform Lower Body Dressing: with min guard assist;sit to/from stand;with adaptive equipment Pt Will Transfer to Toilet: with min guard assist;ambulating;bedside commode Pt/caregiver will Perform Home Exercise Program: Increased strength;Both right and left upper extremity;With theraband;With minimal assist;With written HEP provided Additional ADL Goal #1: pt will tolerate standing 3 minutes for adls  Plan Discharge plan remains appropriate    Co-evaluation                 AM-PAC OT "6 Clicks" Daily Activity     Outcome Measure   Help from another person eating meals?: None Help from another person taking care of personal grooming?: A Little Help from another person toileting, which includes using toliet, bedpan, or urinal?: A Little Help from another person bathing (including washing, rinsing, drying)?: A Little Help from another person to put on and taking off regular upper body clothing?: A Little Help from another person to put on and taking off regular lower body clothing?: A Little 6 Click Score: 19    End of Session Equipment Utilized During Treatment: Gait belt;Rolling walker  OT Visit Diagnosis: Unsteadiness on feet (R26.81);Muscle weakness (generalized) (M62.81);Pain Pain - Right/Left: Right Pain - part of body: Shoulder;Hip   Activity Tolerance Patient limited by pain;Patient tolerated treatment well   Patient Left in bed;with call bell/phone within reach   Nurse Communication          Time: 1031-1105 OT Time Calculation (min): 34 min  Charges: OT General Charges $OT Visit: 1 Visit OT  Treatments $Self Care/Home Management : 23-37 mins  Raynald Kemp, OT Acute Rehabilitation Services Pager: [phone] Office: [phone]   Pilar Grammes 04/14/2019, 1:32 PM

## 2019-04-14 NOTE — Progress Notes (Signed)
Went over AVS with patient.  He verbalized understanding.  Given narcotic scripts.  Patient left hospital with brother via w/c taking all belongings including w/c, 3 in 1 and scripts.    Virginia Rochester, RN

## 2019-04-14 NOTE — Discharge Summary (Addendum)
Physician Discharge Summary  Franklin Anderson JJH:[id number] DOB: 02-22-1971 DOA: 02/21/2019  PCP: Mack Hook, MD  Admit date: 02/21/2019 Discharge date: 04/14/2019  Admitted From:   Disposition:    Recommendations for Outpatient Follow-up:  1. Follow up with PCP in 1-2 weeks 2. Please obtain BMP/CBC in one week 3. Please follow up on the following pending results:  Home Health:  Equipment/Devices:  Discharge Condition: stable   CODE STATUS: full   Diet recommendation: Heart Healthy     Brief/Interim Summary:\   Leanard Anderson [id number] y.o.malewith medical history significant of rheumatic fever, HTN, ongoingIV heroin abuse, nicotine abuse, presented to the ED on 10/27 with 3 week course of progressively worsening back pain and BLE weakness associated with some bowel incontinence and trouble walking. Last use of heroin was on the day of admission. In ED > Found to have sepsis with Tm 100, HR 126, RR 31, WBC 22.4k,AKI with BUN 111 and creat 1.7. Imaging studies revealed osteomyelitis and diskitis of L4-L5,septic pulmonary emboli and psoas abscesses. He was started on IV antibiotics and admitted to [facility]. On 10/28 was found to be obtunded and dyspneic and was intubated. Blood cultures from admission grew out MSSA, subsequently found to have RV vegetation, left sided empyema (MSSA) and a left pneumothorax for which he received a chest tube.He also received a drain in his psoas abscess. Both chest tube and drain have been removed andID has signed off with instructions to continue IV antibiotics until 12/16.Patient on multiple pain meds due to high pain threshold (IV drug abuse)   Briefly, patient had an extended stay of about 52 days in the hospital whereby he finished almost 6-week antibiotic course starting with broad-spectrum vancomycin and cefepime and gradually narrowed down to Ancef until 04/12/2019.  He was later on switched to p.o. Keflex 5 mg 4 times a day as per ID  recommendation due to small amount of persistent abscess as seen on repeat MRI along with facet osteomyelitis at L4 which was relatively new.  Patient did exhibit opiate medication seeking behavior, however with appropriate counseling, he was amenable to working with weaning down off opiates.  On December 18, he was felt to be at his new baseline.  Other than right leg weakness and chronic back pain, he did not have any complaints and so he was discharged home with home health care services and appropriate DME.  He was also given prescription for 7 days worth of opiate medication.  To be noted, family initially had asked for 1 or 2 more days to arrange for his stay at home.  However on 18 December, as per case manager note, they were reluctant to pick him up.  They were informed by case manager that in an unfortunate scenario, patient might be asked to leave the hospital since he is medically stable to be discharged.   Patient has been advised to follow-up with PCP, ID, neurosurgery, cardiology, pulmonary, pain clinic in 1 to 2 weeks for management of his complex medical situation.  Case manager has got an appointment with her PCP next week   During his stay ,   Sepsis with MSSA bacteremia, L4-L5 osteomyelitis and discitis, epidural abscess, psoas abscess, septic pulmonary emboli, empyema and right ventricular mural vegetation. - POA  Currently afebrile, with no leukocytosis. Right psoas abscess was aspirated on 10/28, culture positive for MSSA. Respiratory culture 10/28 also MSSA 2D echo 10/28 showed possible mitral valve vegetation and moderate pericardial effusion. Underwent TEE on 11/3 showed medium  sized, mobile mass attached to the trabeculation in the RV and moderate sized pericardial effusion Per CT surgery, not a good candidate for thoracotomy MRI on 11/17 showed increasing psoas abscess s/p catheter drainage of right paraspinal abscess by IR on 11/18 ID recommended continue IV Ancef through  12/16 .  Discussed with ID on 04/14/2019 Dr Talbot Grumbling about new MRI findings of slight worsening of disc abscess and new facetal osteomyelitis at L4.  He mentioned that this could be the evaluation of the septic process.  He recommended increasing Keflex from once a day to 500 mg 4 times a day for at least 1 month and outpatient follow-up with ID  Pain management consists of diclofenac , fentanyl patch, Flexeril,  as needed and oral oxycodone prn for breakthrough pain. Monitor for narcotic seeking behavior  Dilaudid discontinued on 12/16 . Increase  Neurontin 600 mg twice a day to help with neuropathy pain  Outpatient follow-up with neurosurgery and pain clinic  Repeat MRI on 12/17   Acute respiratory failure with hypoxemia secondary to MSSA infection, septic pulmonary emboli, left empyema, left bronchopleural fistula (hydropneumothorax) Resolved, currently on RA Patient was intubated on 10/28-10/31 Underwent bronchoscopy with BAL, s/p left chest tube, removed on 11/17 Chest x-ray 12/2 showed bilateral pulmonary nodules, which are improved compared to prior exam on 11/19. Small residual left apical pneumothorax  Moderate pericardial effusion Seen on 2D echo and TEE, treated conservatively and improving Last echo on 11/9 showed mild effusion (improvement).  Essential hypertension BP stable Continue Norvasc, metoprolol  Severe protein calorie malnutrition Continue dietary supplements  HCV positive Per ID, seems to have cleared  Pressure injury POA  Sacrum with stage I, Right hip deep tissue injury Wound care per nursing  Acute  Septic  Encephalopathy due to bactermia POA  - resolved    Mild hyperglycemai -  a1c  4.5  Reactive tachycardia - from underlying endocarditis, pain . cx to monitor    Discharge Diagnoses:  Principal Problem:   Sepsis ([place]) Active Problems:   Dyspnea   Heroin use   Endocarditis   Septic pulmonary embolism (H[facility])   Osteomyelitis of  thoracic spine (HCC)   Osteomyelitis of lumbar spine (HCC)   Acute encephalopathy   AKI (acute kidney injury) ([place])   Transaminitis   Psoas abscess (HCC)   Pressure injury of skin   Mucus clot in bronchi   Protein-calorie malnutrition, severe   Abnormal echocardiogram   MSSA bacteremia   Acute pulmonary embolism (HCC)   Pleural effusion on left   Empyema, left (HCC)   Chest tube in place   Pain   Pneumothorax on left   Fistula   Epidural abscess    Discharge Instructions  Discharge Instructions    Diet - low sodium heart healthy   Complete by: As directed    Diet - low sodium heart healthy   Complete by: As directed    Diet - low sodium heart healthy   Complete by: As directed    Diet - low sodium heart healthy   Complete by: As directed    For home use only DME Nebulizer machine   Complete by: As directed    Patient needs a nebulizer to treat with the following condition: Pulmonary embolism, septic ([place])   Length of Need: 12 Months   Increase activity slowly   Complete by: As directed    Increase activity slowly   Complete by: As directed    Increase activity slowly   Complete  by: As directed    Increase activity slowly   Complete by: As directed      Allergies as of 04/14/2019      Reactions   Penicillins Other (See Comments)      Medication List    STOP taking these medications   lisinopril 10 MG tablet Commonly known as: ZESTRIL     TAKE these medications   acetaminophen 325 MG tablet Commonly known as: TYLENOL Take 2 tablets (650 mg total) by mouth every 6 (six) hours as needed for mild pain (or Fever >/= 101).   amLODipine 10 MG tablet Commonly known as: NORVASC Take 1 tablet (10 mg total) by mouth daily.   cephALEXin 500 MG capsule Commonly known as: KEFLEX Take 1 capsule (500 mg total) by mouth 4 (four) times daily.   clonazePAM 0.5 MG tablet Commonly known as: KLONOPIN Take 1 tablet (0.5 mg total) by mouth 3 (three) times daily as  needed for up to 7 days (anxiety).   cyclobenzaprine 10 MG tablet Commonly known as: FLEXERIL Take 1 tablet (10 mg total) by mouth 3 (three) times daily as needed for muscle spasms.   diclofenac 50 MG EC tablet Commonly known as: VOLTAREN Take 1 tablet (50 mg total) by mouth 2 (two) times daily for 15 days.   feeding supplement (ENSURE ENLIVE) Liqd Take 237 mLs by mouth 2 (two) times daily between meals.   fentaNYL 50 MCG/HR Commonly known as: [place] 1 patch onto the skin every 3 (three) days for 7 days. Start taking on: April 16, 2019   gabapentin 300 MG capsule Commonly known as: NEURONTIN Take 2 capsules (600 mg total) by mouth 2 (two) times daily.   hydrochlorothiazide 25 MG tablet Commonly known as: HYDRODIURIL Take 1 tablet (25 mg total) by mouth daily.   ipratropium-albuterol 0.5-2.5 (3) MG/3ML Soln Commonly known as: DUONEB Take 3 mLs by nebulization every 6 (six) hours as needed.   lidocaine 5 % Commonly known as: LIDODERM Place 1 patch onto the skin daily. Remove & Discard patch within 12 hours or as directed by MD   metoprolol tartrate 100 MG tablet Commonly known as: LOPRESSOR Take 1 tablet (100 mg total) by mouth 2 (two) times daily.   multivitamin with minerals Tabs tablet Take 1 tablet by mouth daily.   ondansetron 4 MG tablet Commonly known as: ZOFRAN Take 1 tablet (4 mg total) by mouth every 6 (six) hours as needed for nausea.   oxyCODONE 5 MG immediate release tablet Commonly known as: Oxy IR/ROXICODONE Take 1-2 tablets (5-10 mg total) by mouth every 6 (six) hours as needed for up to 7 days for severe pain or breakthrough pain.   pantoprazole 40 MG tablet Commonly known as: PROTONIX Take 1 tablet (40 mg total) by mouth daily. Start taking on: April 15, 2019   polyethylene glycol 17 g packet Commonly known as: MIRALAX / GLYCOLAX Take 17 g by mouth daily. Start taking on: April 15, 2019   senna-docusate 8.6-50 MG  tablet Commonly known as: Senokot-S Take 2 tablets by mouth at bedtime as needed for mild constipation or moderate constipation.            Durable Medical Equipment  (From admission, onward)         Start     Ordered   04/14/19 1523  For home use only DME 3 n 1  Once     04/14/19 1522   04/14/19 0000  For home use only DME Nebulizer  machine    Question Answer Comment  Patient needs a nebulizer to treat with the following condition Pulmonary embolism, septic ([place])   Length of Need 12 Months      04/14/19 1016   04/13/19 1029  For home use only DME lightweight manual wheelchair with seat cushion  Once    Comments: Patient suffers from  Chronic back and rt hip pain which impairs their ability to perform daily activities like bathing, dressing, feeding and grooming in the home.  A walker will not resolve  issue with performing activities of daily living. A wheelchair will allow patient to safely perform daily activities. Patient is not able to propel themselves in the home using a standard weight wheelchair due to endurance and general weakness. Patient can self propel in the lightweight wheelchair. Length of need Lifetime. Accessories: elevating leg rests (ELRs), wheel locks, extensions and anti-tippers.   04/13/19 1028   04/12/19 1057  For home use only DME wheelchair cushion (seat and back)  (Wheelchairs)  Once     04/12/19 1058         Follow-up Information    Services, [facility] Follow up.   Why: 28 day program, for which patient would need to sign a release for a referral, and he would need to be accepted ahead of time there to go from hospital, medical issues not withstanding Contact information: [place] [address] [phone]        [facility], [facility] Follow up.   Specialty: Behavioral Health Why: Methadone maintenance program-outpt  Address is actually [address].  Same [phone number Contact information: [place] [address] [phone]        Tanda Rockers, MD Follow up on 04/24/2019.   Specialty: Pulmonary Disease Why: Appt at 11:00 am to follow up post hospitalization - you were seen in the hospital for hydropneumothorax.   Contact information: [address] [place] [address] [place] Follow up on 04/18/2019.   Why: Tuesday at 3:30 via phone. Nurse will call you day and time of appointment.  Contact information: [place] [phone]         Allergies  Allergen Reactions  . Penicillins Other (See Comments)    Consultations:     Procedures/Studies: DG Chest 1 View  Result Date: 03/16/2019 CLINICAL DATA:  Pleural effusion. EXAM: CHEST  1 VIEW COMPARISON:  March 15, 2019. FINDINGS: The heart size and mediastinal contours are within normal limits. Stable mild left apical and basilar pneumothorax is noted. Stable bilateral nodular densities are noted, some of which are cavitary, consistent with septic emboli. The visualized skeletal structures are unremarkable. IMPRESSION: Stable mild left pneumothorax. Stable bilateral nodular densities are again noted consistent with septic emboli. Electronically Signed   By: Marijo Conception M.D.   On: 03/16/2019 13:49   CT LUMBAR SPINE W CONTRAST  Result Date: 03/20/2019 CLINICAL DATA:  Abdominal pain and fever. Abscess. At the drug abuse. Progressively worse back pain and bilateral lower extremity weakness. EXAM: CT LUMBAR SPINE WITH CONTRAST TECHNIQUE: Multidetector CT imaging of the lumbar spine was performed with intravenous contrast administration. CONTRAST:  126m OMNIPAQUE IOHEXOL 300 MG/ML  SOLN COMPARISON:  MRI of the lumbar spine 03/14/2019. CT guided drain placement 03/15/2019 FINDINGS: Segmentation: 5 non rib-bearing lumbar type vertebral bodies are present. The  lowest fully formed  vertebral body is L5. Alignment: No significant listhesis is present. There is straightening of the normal lumbar lordosis. Rightward curvature is present at the thoracolumbar junction. Compensatory leftward curvature is present at L5-S1. Vertebrae: Lytic changes are noted at the endplates of D7-4 with some collapse on right. There is no progression collapse. Vertebral body heights are otherwise maintained. No other focal lytic or blastic lesions are present. Paraspinal and other soft tissues: Right paraspinal drain is in place. The largest collection collapsed. A more posterior and slightly superior peripherally enhancing collection measures 12 mm. No significant adenopathy is present. Atherosclerotic changes are noted in the aorta without aneurysm. Disc levels: L1-2: Negative. L2-3: No significant erosive changes are present. Leftward disc protrusion is noted. L3-4: A leftward disc protrusion present. Mild facet hypertrophy is noted bilaterally. L4-5: A broad-based disc protrusion is associated with the infected disc. Moderate foraminal stenosis is again seen bilaterally. L5-S1: Mild disc bulging is present. Facet hypertrophy is worse on the right. IMPRESSION: 1. The right paraspinous scratched at the larger of the 2 right paraspinous fluid collections is drained. 2. The smaller, more superior posterior collection remains, measuring up to 12 mm. 3. Stable lytic endplate changes associated with disc osteomyelitis at L4-5. Collapse is worse on the right, stable from prior exam. 4. Subtle endplate changes posteriorly at L2-3 and at the right facet of L5-S1 without progression. 5.  Aortic Atherosclerosis (ICD10-I70.0). Electronically Signed   By: San Morelle M.D.   On: 03/20/2019 15:41   MR Lumbar Spine W Wo Contrast  Result Date: 04/13/2019 CLINICAL DATA:  Two EXAM: MRI LUMBAR SPINE WITHOUT AND WITH CONTRAST TECHNIQUE: Multiplanar and multiecho pulse sequences of the lumbar spine were obtained without  and with intravenous contrast. CONTRAST:  60m GADAVIST GADOBUTROL 1 MMOL/ML IV SOLN COMPARISON:  MRIs of the lumbar spine dated 03/27/2019 03/14/2019 and CT scan of the lumbar spine dated 03/20/2019 FINDINGS: Segmentation:  Standard. Alignment:  Physiologic. Vertebrae: Again noted is osteomyelitis discitis at L2-3 and L4-5. See below discussion. Conus medullaris and cauda equina: Conus extends to the L1-2 level. Conus and cauda equina appear normal. Paraspinal and other soft tissues: There is edema deep to the psoas muscles bilaterally from L2-3 through L4-5 to the level of the L5-S1 disc space. Small right paraspinal abscess at L4-5 is smaller than on the prior study. Disc levels: T12-L1 and L1-2: No significant. L2-3: Persistent enhancement of the posterior aspect of the disc space and of the vertebral endplates. No epidural abscess. No significant change since the prior study. L3-4: Disc protrusion to the left of midline with a small disc bulge to the right of midline, unchanged. L4-5: Discitis and osteomyelitis are again noted at L4-5 pus in the disc space and extending slightly anterior to the vertebral bodies just to the right of midline, slightly diminished since the prior study. There is edema and abnormal enhancement of both facet joints consistent with osteomyelitis and septic arthritis of those facet joints. There is increased enhancement of inferior facets of L4 bilaterally as compared to the prior study. There is abnormal enhancement in both neural foramina at L4-5, unchanged. L5-S1: Tiny broad-based disc bulge without neural impingement, unchanged. IMPRESSION: 1. Slight decrease in the size of the right paraspinal abscess at L4-5 since the prior study. 2. Increased enhancement of the inferior facets of L4 bilaterally consistent with osteomyelitis and septic arthritis of those facet joints. 3. No epidural abscess. 4. Slightly pus in the L4-5 disc space since the prior  study. 5. No change in the small  disc protrusion at L2-3 without neural impingement. 6. No new neural impingement since the prior study. Electronically Signed   By: Lorriane Shire M.D.   On: 04/13/2019 20:39   MR Lumbar Spine W Wo Contrast  Result Date: 03/27/2019 CLINICAL DATA:  Back pain, infection. EXAM: MRI LUMBAR SPINE WITHOUT AND WITH CONTRAST TECHNIQUE: Multiplanar and multiecho pulse sequences of the lumbar spine were obtained without and with intravenous contrast. CONTRAST:  2m GADAVIST GADOBUTROL 1 MMOL/ML IV SOLN COMPARISON:  CT scan 03/20/2019 MRI from 03/14/2019 FINDINGS: Segmentation: The lowest lumbar type non-rib-bearing vertebra is labeled as L5. Alignment:  No vertebral subluxation is observed. Vertebrae: Again observed discitis osteomyelitis at the L4-5 level with endplate edema and sclerosis, and irregular rim enhancing collections along the anterior intervertebral disc space. There is bilateral facet edema and enhancement at this level, and septic arthritis of the facet joints is not excluded given the appearance. Endplate enhancement and type 1 degenerative endplate findings at LC6-2 with subtle accentuated T2 signal in the L2-3 intervertebral disc, a second site of early discitis is likely. Stable 6 mm focus of T2 hyperintensity in the T12 vertebral body, probably a small hemangioma or similar benign lesion. Left eccentric type 1 degenerative endplate findings at [id number] Conus medullaris and cauda equina: Conus extends to the L1-2. Paraspinal and other soft tissues: Small abscesses are present along the right psoas muscle with surrounding myositis. This includes a 0.9 by 1.0 by 2.0 cm abscess on image 34/6 anteriorly, and a 0.9 cm posterior right psoas abscess on image 30/6. Paraspinal phlegmon is most prominent at the L4-5 level and surrounds the vertebral level, and also at the L2-3 level and adjacent levels up to T12. The previous enhancing epidural phlegmon at the L4-5 level is less striking than on the prior exam.  There is abnormal enhancement extending in the L4-5 and to a lesser extent L5-S1 neural foramina bilaterally. I do not see a well-defined epidural abscess. Disc levels: T12-L1: Unremarkable. L1-2: No impingement. L2-3: Borderline left subarticular lateral recess stenosis due to left eccentric intervertebral spurring and facet spurring. L3-4: Mild left subarticular lateral recess stenosis and mild left foraminal stenosis due to left lateral recess and foraminal disc protrusion, disc bulge, and mild facet arthropathy. L4-5: At least mild bilateral foraminal stenosis due to facet and intervertebral spurring. L5-S1: Borderline bilateral foraminal stenosis due to disc bulge, intervertebral spurring, and facet arthropathy. IMPRESSION: 1. Active discitis-osteomyelitis at L4-5 and L2-3, with small right psoas muscle abscesses (reduced in size from prior exam) and considerable paraspinal phlegmon tracking between T12 L5. The amount of anterior epidural phlegmon at the L4-5 level is improved, but there is continued abnormal enhancement in both neural foramina at L4-5 and L5-S1. In addition, the facet joints at L4-5 demonstrate severe arthropathy and septic facet joints are not excluded. 2. Lumbar spondylosis and degenerative disc disease with mild impingement at L3-4 and L4-5. Electronically Signed   By: WVan ClinesM.D.   On: 03/27/2019 18:48   CT HIP RIGHT WO CONTRAST  Result Date: 03/31/2019 CLINICAL DATA:  Severe right hip pain for 5 days. No known injury. Suspected hip fracture. EXAM: CT OF THE RIGHT HIP WITHOUT CONTRAST TECHNIQUE: Multidetector CT imaging of the right hip was performed according to the standard protocol. Multiplanar CT image reconstructions were also generated. COMPARISON:  Pelvic CT 06/14/2006. FINDINGS: Bones/Joint/Cartilage Examination is targeted to the right hip and inferior right hemipelvis. There is no evidence of acute  fracture, dislocation or femoral head avascular necrosis. The  bones appear adequately mineralized. There is no significant right hip arthropathy or joint effusion. Ligaments Suboptimally assessed by CT. Muscles and Tendons No focal muscular abnormalities are identified. Soft tissues There is mild generalized soft tissue edema without focal fluid collection, foreign body or soft tissue emphysema. The visualized internal pelvic contents appear unremarkable. IMPRESSION: 1. No evidence of acute right hip fracture or dislocation. 2. Mild generalized soft tissue edema. Electronically Signed   By: Richardean Sale M.D.   On: 03/31/2019 15:16   DG CHEST PORT 1 VIEW  Result Date: 03/29/2019 CLINICAL DATA:  Fever, heroin abuse EXAM: PORTABLE CHEST 1 VIEW COMPARISON:  03/16/2019 FINDINGS: Multiple bilateral pulmonary nodules which have improved compared with the prior examination of 03/16/2019. No new focal consolidation. No pleural effusion. Small residual left apical pneumothorax. Stable cardiomediastinal silhouette. No aggressive osseous lesion. IMPRESSION: 1. Bilateral pulmonary nodules which are improved compared with the prior examination of 03/16/2019. Small residual left apical pneumothorax. Electronically Signed   By: Kathreen Devoid   On: 03/29/2019 15:32       Subjective:   Discharge Exam: Vitals:   04/14/19 0409 04/14/19 1516  BP: 128/84 (!) 134/91  Pulse: 90 (!) 102  Resp: 18 18  Temp: 97.9 F (36.6 C) 98.4 F (36.9 C)  SpO2: 100% 99%   Vitals:   04/13/19 1343 04/13/19 2041 04/14/19 0409 04/14/19 1516  BP: 129/84 (!) 137/95 128/84 (!) 134/91  Pulse: 94 (!) 103 90 (!) 102  Resp: _0 Temp: 98.2 F (36.8 C) 97.9 F (36.6 C) 97.9 F (36.6 C) 98.4 F (36.9 C)  TempSrc: Oral   Oral  SpO2: 97% 99% 100% 99%  Weight:      Height:        General: Pt is alert, awake, not in acute distress Cardiovascular: RRR, S1/S2 +, no rubs, no gallops Respiratory: CTA bilaterally, no wheezing, no rhonchi Abdominal: Soft, NT, ND, bowel sounds  + Extremities: no edema, no cyanosis    The results of significant diagnostics from this hospitalization (including imaging, microbiology, ancillary and laboratory) are listed below for reference.     Microbiology: No results found for this or any previous visit (from the past 240 hour(s)).   Labs: BNP (last 3 results) Recent Labs    02/23/19 0802  BNP 468.0*   Basic Metabolic Panel: Recent Labs  Lab 04/10/19 1021 04/11/19 1002 04/12/19 0643 04/13/19 0603 04/14/19 0546  NA 137 137 136 137 138  K 3.8 4.1 4.7 4.3 4.2  CL 100 97* 99 97* 100  CO2 _1 GLUCOSE 186* 156* 113* 115* 90  BUN 17 24* 21* 24* 28*  CREATININE 0.46* 0.48* 0.43* 0.42* 0.49*  CALCIUM 9.5 10.1 10.0 10.1 9.9  MG  --  1.9  --   --   --    Liver Function Tests: No results for input(s): AST, ALT, ALKPHOS, BILITOT, PROT, ALBUMIN in the last 168 hours. No results for input(s): LIPASE, AMYLASE in the last 168 hours. No results for input(s): AMMONIA in the last 168 hours. CBC: Recent Labs  Lab 04/10/19 1021 04/11/19 1002 04/12/19 0643 04/13/19 0603 04/14/19 0546  WBC 7.0 7.6 7.0 6.2 6.2  HGB 10.9* 11.6* 11.8* 11.0* 10.6*  HCT 34.6* 36.8* 38.1* 34.9* 33.8*  MCV 95.1 94.1 95.7 94.6 94.9  PLT 355 374 336 392 369   Cardiac Enzymes: No results for input(s): CKTOTAL, CKMB, CKMBINDEX, TROPONINI  in the last 168 hours. BNP: Invalid input(s): POCBNP CBG: Recent Labs  Lab 04/08/19 0751 04/08/19 1201 04/08/19 1609  GLUCAP 98 84 154*   D-Dimer No results for input(s): DDIMER in the last 72 hours. Hgb A1c No results for input(s): HGBA1C in the last 72 hours. Lipid Profile No results for input(s): CHOL, HDL, LDLCALC, TRIG, CHOLHDL, LDLDIRECT in the last 72 hours. Thyroid function studies No results for input(s): TSH, T4TOTAL, T3FREE, THYROIDAB in the last 72 hours.  Invalid input(s): FREET3 Anemia work up No results for input(s): VITAMINB12, FOLATE, FERRITIN, TIBC, IRON, RETICCTPCT in  the last 72 hours. Urinalysis    Component Value Date/Time   BILIRUBINUR neg 12/21/2017 1551   PROTEINUR Negative 12/21/2017 1551   UROBILINOGEN 0.2 12/21/2017 1551   NITRITE neg 12/21/2017 1551   LEUKOCYTESUR Negative 12/21/2017 1551   Sepsis Labs Invalid input(s): PROCALCITONIN,  WBC,  LACTICIDVEN Microbiology No results found for this or any previous visit (from the past 240 hour(s)).   Time coordinating discharge: Over 30 minutes  SIGNED:   Vicenta Dunning, MD  [facility] 04/14/2019, 7:56 PM Pager   If 7PM-7AM, please contact night-coverage [email or web address] Password [email or web address]

## 2019-04-14 NOTE — TOC Transition Note (Signed)
Transition of Care [facility]) - CM/SW Discharge Note   Patient Details  Name: BURDETTE FOREHAND MRN: [id number] Date of Birth: 04/18/1971  [facility] CM/SW Contact:  Trish Mage, LCSW Phone Number: 04/14/2019, 3:49 PM   Clinical Narrative:   Pt to discharge today.  Has DME wheelchair and 3in 1 to take with, as well as nebulizer.  Has MATCH voucher for meds. Is set up for multiple medical appointments, including PCP at [facility] on Tuesday so he can continue to get scripts.  Set up with [facility]. Spoke with brother and sister in law, who asked for extension of his stay here.  I was clear with them that is not an option, that he will be discharged today.  When they asked what would happen if they did not come to pick up, I told them he would be wheeled to the exit and sent out on his own.  I reminded them that we had already given them a 2 day grace period, as he was originally scheduled to d/c Wednesday, and when they requested more time, we held off until today. TOC sign off.        Final next level of care: Home w Home Health Services Barriers to Discharge: No Barriers Identified   Patient Goals and CMS Choice Patient states their goals for this hospitalization and ongoing recovery are:: "God gave me another chance.  I'm here for a reason and I need to do right."      Discharge Placement                       Discharge Plan and Services In-house Referral: Clinical Social Work                                   Social Determinants of Health (SDOH) Interventions     Readmission Risk Interventions No flowsheet data found.

## 2019-04-14 NOTE — Progress Notes (Signed)
PROGRESS NOTE    Franklin Anderson  PFX:[id number] DOB: 1970/11/14 DOA: 02/21/2019 PCP: Mack Hook, MD    Brief Narrative: Franklin Anderson [phone] y.o.malewith medical history significant of rheumatic fever, HTN, ongoingIV heroin abuse, nicotine abuse, presented to the ED on 10/27 with 3 week course of progressively worsening back pain and BLE weakness associated with some bowel incontinence and trouble walking. Last use of heroin was on the day of admission. In ED > Found to have sepsis with Tm 100, HR 126, RR 31, WBC 22.4k,AKI with BUN 111 and creat 1.7. Imaging studies revealed osteomyelitis and diskitis of L4-L5,septic pulmonary emboli and psoas abscesses. He was started on IV antibiotics and admitted to [facility]. On 10/28 was found to be obtunded and dyspneic and was intubated. Blood cultures from admission grew out MSSA, subsequently found to have RV vegetation, left sided empyema (MSSA) and a left pneumothorax for which he received a chest tube.He also received a drain in his psoas abscess. Both chest tube and drain have been removed and ID has signed off with instructions to continue IV antibiotics until 12/16. Patient on multiple pain meds due to high pain threshold (IV drug abuse)  Assessment & Plan:   Principal Problem:   Sepsis ([place]) Active Problems:   Dyspnea   Heroin use   Endocarditis   Septic pulmonary embolism ([place])   Osteomyelitis of thoracic spine ([place])   Osteomyelitis of lumbar spine (HCC)   Acute encephalopathy   AKI (acute kidney injury) ([place])   Transaminitis   Psoas abscess (HCC)   Pressure injury of skin   Mucus clot in bronchi   Protein-calorie malnutrition, severe   Abnormal echocardiogram   MSSA bacteremia   Acute pulmonary embolism (HCC)   Pleural effusion on left   Empyema, left (HCC)   Chest tube in place   Pain   Pneumothorax on left   Fistula   Epidural abscess  Sepsis with MSSA bacteremia, L4-L5 osteomyelitis and discitis, epidural  abscess, psoas abscess, septic pulmonary emboli, empyema and right ventricular mural vegetation. - POA  Currently afebrile, with no leukocytosis. Right psoas abscess was aspirated on 10/28, culture positive for MSSA. Respiratory culture 10/28 also MSSA 2D echo 10/28 showed possible mitral valve vegetation and moderate pericardial effusion. Underwent TEE on 11/3 showed medium sized, mobile mass attached to the trabeculation in the RV and moderate sized pericardial effusion Per CT surgery, not a good candidate for thoracotomy MRI on 11/17 showed increasing psoas abscess s/p catheter drainage of right paraspinal abscess by IR on 11/18 ID recommended continue IV Ancef through 12/16 .  Discussed with ID on 04/14/2019 Dr Talbot Grumbling about new MRI findings of slight worsening of disc abscess and new facetal osteomyelitis at L4.  He mentioned that this could be the evaluation of the septic process.  He recommended increasing Keflex from once a day to 5 mg 4 times a day for at least 1 month and outpatient follow-up with ID  Pain management consists of diclofenac , fentanyl patch, Flexeril,  as needed and oral oxycodone prn for breakthrough pain. Monitor for narcotic seeking behavior  Dilaudid discontinued on 12/16 . Increase  Neurontin 600 mg twice a day to help with neuropathy pain  Outpatient follow-up with neurosurgery and pain clinic  Repeat MRI on 12/17   Acute respiratory failure with hypoxemia secondary to MSSA infection, septic pulmonary emboli, left empyema, left bronchopleural fistula (hydropneumothorax) Resolved, currently on RA Patient was intubated on 10/28-10/31 Underwent bronchoscopy with BAL, s/p left chest  tube, removed on 11/17 Chest x-ray 12/2 showed bilateral pulmonary nodules, which are improved compared to prior exam on 11/19.  Small residual left apical pneumothorax  Moderate pericardial effusion Seen on 2D echo and TEE, treated conservatively and improving Last echo on 11/9  showed mild effusion (improvement).  Essential hypertension BP stable Continue Norvasc, metoprolol  Severe protein calorie malnutrition Continue dietary supplements  HCV positive Per ID, seems to have cleared  Pressure injury POA  Sacrum with stage I, Right hip deep tissue injury Wound care per nursing  Acute  Septic  Encephalopathy due to bactermia POA  - resolved    Mild hyperglycemai -  a1c  4.5  Reactive tachycardia - from underlying endocarditis, pain . cx to monitor   Patient was supposed to be discharged on 04/14/2019.  However at the time of writing this note, patient is still here and it seems like the family has not arrived to pick him up.  As per case manager note, patient has already been given to decrease days to arrange for all his requirements at home.  Patient's pain medication and other prescriptions have already been arranged for along with home health care.  Patient is ready to be discharged    Code Status: Full code DVT Prophylaxis:  Lovenox  Family Communication: None at bedside Disposition Plan: Remains inpatient till completes IV antibiotics, completes on 12/16  Consultants:  Infectious disease, cardiology, palliative care, cardiothoracic surgery  Procedures:  TEE Intubation  Antimicrobials: Anti-infectives (From admission, onward)   Start     Dose/Rate Route Frequency Ordered Stop   04/14/19 0000  cephALEXin (KEFLEX) 500 MG capsule     500 mg Oral 4 times daily 04/14/19 1016 05/14/19 2359   04/13/19 1230  cephALEXin (KEFLEX) capsule 500 mg     500 mg Oral Daily 04/13/19 1146     02/24/19 2200  vancomycin (VANCOCIN) IVPB 1000 mg/200 mL premix  Status:  Discontinued     1,000 mg 200 mL/hr over 60 Minutes Intravenous Every 12 hours 02/24/19 1010 02/24/19 1253   02/24/19 1600  ceFAZolin (ANCEF) IVPB 2g/100 mL premix     2 g 200 mL/hr over 30 Minutes Intravenous Every 8 hours 02/24/19 1253 04/12/19 2234   02/23/19 2200  vancomycin (VANCOCIN)  IVPB 750 mg/150 ml premix  Status:  Discontinued     750 mg 150 mL/hr over 60 Minutes Intravenous Every 24 hours 02/23/19 0853 02/23/19 0909   02/23/19 2200  vancomycin (VANCOCIN) IVPB 750 mg/150 ml premix  Status:  Discontinued     750 mg 150 mL/hr over 60 Minutes Intravenous Every 12 hours 02/23/19 0909 02/24/19 1010   02/22/19 2200  vancomycin (VANCOCIN) IVPB 750 mg/150 ml premix  Status:  Discontinued     750 mg 150 mL/hr over 60 Minutes Intravenous Every 24 hours 02/22/19 0826 02/22/19 0827   02/22/19 2200  vancomycin (VANCOCIN) IVPB 1000 mg/200 mL premix  Status:  Discontinued     1,000 mg 200 mL/hr over 60 Minutes Intravenous Every 24 hours 02/22/19 0827 02/23/19 0853   02/22/19 0900  ceFEPIme (MAXIPIME) 2 g in sodium chloride 0.9 % 100 mL IVPB  Status:  Discontinued     2 g 200 mL/hr over 30 Minutes Intravenous Every 12 hours 02/22/19 0826 02/23/19 1135   02/21/19 2015  ceFEPIme (MAXIPIME) 2 g in sodium chloride 0.9 % 100 mL IVPB     2 g 200 mL/hr over 30 Minutes Intravenous  Once 02/21/19 2013 02/22/19 0031   02/21/19  2015  metroNIDAZOLE (FLAGYL) IVPB 500 mg     500 mg 100 mL/hr over 60 Minutes Intravenous  Once 02/21/19 2013 02/22/19 0031   02/21/19 2015  vancomycin (VANCOCIN) IVPB 1000 mg/200 mL premix     1,000 mg 200 mL/hr over 60 Minutes Intravenous  Once 02/21/19 2013 02/22/19 0200      Subjective: Patient was seen and examined at bedside. He complains of  Rt hip pain which seems to be at baseline, 7/10 but looks comfortable.denies any chest pain, shortness of breath,  abdominal pain, fever ,chills.  No overnight events.  He is unable to work with physical therapy due to worsening of his right hip pain as per him along with right leg weakness.  Mri shows slight  Slight decrease in right paraspinal abscess, inferior facet osteomyelitis at L4 level, select was an L4-L5 disc space since prior study  neurontin helped him somewhat .  Patient is otherwise eating and  drinking fine, no trouble passing urine or stool.  Objective: Vitals:   04/13/19 1343 04/13/19 2041 04/14/19 0409 04/14/19 1516  BP: 129/84 (!) 137/95 128/84 (!) 134/91  Pulse: 94 (!) 103 90 (!) 102  Resp: _0 Temp: 98.2 F (36.8 C) 97.9 F (36.6 C) 97.9 F (36.6 C) 98.4 F (36.9 C)  TempSrc: Oral   Oral  SpO2: 97% 99% 100% 99%  Weight:      Height:        Intake/Output Summary (Last 24 hours) at 04/14/2019 1851 Last data filed at 04/13/2019 1938 Gross per 24 hour  Intake --  Output 400 ml  Net -400 ml   Filed Weights   03/14/19 0500 03/15/19 0522 03/15/19 2000  Weight: 49.8 kg 49.9 kg 49.9 kg    Examination:  General exam: Resting comfortable, not in acute distress Respiratory system: Clear to auscultation. Respiratory effort normal. Cardiovascular system: S1 & S2 heard, RRR. No JVD, murmurs, rubs, gallops or clicks. No pedal edema. Gastrointestinal system: Abdomen is nondistended, soft and nontender. No organomegaly or masses felt. Normal bowel sounds heard. Central nervous system: Alert and oriented. No focal neurological deficits. Extremities :  no pedal edema, no calf tenderness. Skin: No rashes, lesions or ulcers Psychiatry: Judgement and insight appear normal. Mood & affect appropriate.   Right straight leg raising test positive subjectively as per patient Decreased rt leg strength.  Reproduction of back pain radiating to right limb  Data Reviewed: I have personally reviewed following labs and imaging studies  CBC: Recent Labs  Lab 04/10/19 1021 04/11/19 1002 04/12/19 0643 04/13/19 0603 04/14/19 0546  WBC 7.0 7.6 7.0 6.2 6.2  HGB 10.9* 11.6* 11.8* 11.0* 10.6*  HCT 34.6* 36.8* 38.1* 34.9* 33.8*  MCV 95.1 94.1 95.7 94.6 94.9  PLT 355 374 336 392 580   Basic Metabolic Panel: Recent Labs  Lab 04/10/19 1021 04/11/19 1002 04/12/19 0643 04/13/19 0603 04/14/19 0546  NA 137 137 136 137 138  K 3.8 4.1 4.7 4.3 4.2  CL 100 97* 99 97* 100   CO2 _1 GLUCOSE 186* 156* 113* 115* 90  BUN 17 24* 21* 24* 28*  CREATININE 0.46* 0.48* 0.43* 0.42* 0.49*  CALCIUM 9.5 10.1 10.0 10.1 9.9  MG  --  1.9  --   --   --    GFR: Estimated Creatinine Clearance: 78.8 mL/min (A) (by C-G formula based on SCr of 0.49 mg/dL (L)). Liver Function Tests: No results for input(s): AST, ALT, ALKPHOS, BILITOT,  PROT, ALBUMIN in the last 168 hours. No results for input(s): LIPASE, AMYLASE in the last 168 hours. No results for input(s): AMMONIA in the last 168 hours. Coagulation Profile: No results for input(s): INR, PROTIME in the last 168 hours. Cardiac Enzymes: No results for input(s): CKTOTAL, CKMB, CKMBINDEX, TROPONINI in the last 168 hours. BNP (last 3 results) No results for input(s): PROBNP in the last 8760 hours. HbA1C: No results for input(s): HGBA1C in the last 72 hours. CBG: Recent Labs  Lab 04/08/19 0751 04/08/19 1201 04/08/19 1609  GLUCAP 98 84 154*   Lipid Profile: No results for input(s): CHOL, HDL, LDLCALC, TRIG, CHOLHDL, LDLDIRECT in the last 72 hours. Thyroid Function Tests: No results for input(s): TSH, T4TOTAL, FREET4, T3FREE, THYROIDAB in the last 72 hours. Anemia Panel: No results for input(s): VITAMINB12, FOLATE, FERRITIN, TIBC, IRON, RETICCTPCT in the last 72 hours. Sepsis Labs: No results for input(s): PROCALCITON, LATICACIDVEN in the last 168 hours.  No results found for this or any previous visit (from the past 240 hour(s)).   Radiology Studies: MR Lumbar Spine W Wo Contrast  Result Date: 04/13/2019 CLINICAL DATA:  Two EXAM: MRI LUMBAR SPINE WITHOUT AND WITH CONTRAST TECHNIQUE: Multiplanar and multiecho pulse sequences of the lumbar spine were obtained without and with intravenous contrast. CONTRAST:  29m GADAVIST GADOBUTROL 1 MMOL/ML IV SOLN COMPARISON:  MRIs of the lumbar spine dated 03/27/2019 03/14/2019 and CT scan of the lumbar spine dated 03/20/2019 FINDINGS: Segmentation:  Standard. Alignment:   Physiologic. Vertebrae: Again noted is osteomyelitis discitis at L2-3 and L4-5. See below discussion. Conus medullaris and cauda equina: Conus extends to the L1-2 level. Conus and cauda equina appear normal. Paraspinal and other soft tissues: There is edema deep to the psoas muscles bilaterally from L2-3 through L4-5 to the level of the L5-S1 disc space. Small right paraspinal abscess at L4-5 is smaller than on the prior study. Disc levels: T12-L1 and L1-2: No significant. L2-3: Persistent enhancement of the posterior aspect of the disc space and of the vertebral endplates. No epidural abscess. No significant change since the prior study. L3-4: Disc protrusion to the left of midline with a small disc bulge to the right of midline, unchanged. L4-5: Discitis and osteomyelitis are again noted at L4-5 pus in the disc space and extending slightly anterior to the vertebral bodies just to the right of midline, slightly diminished since the prior study. There is edema and abnormal enhancement of both facet joints consistent with osteomyelitis and septic arthritis of those facet joints. There is increased enhancement of inferior facets of L4 bilaterally as compared to the prior study. There is abnormal enhancement in both neural foramina at L4-5, unchanged. L5-S1: Tiny broad-based disc bulge without neural impingement, unchanged. IMPRESSION: 1. Slight decrease in the size of the right paraspinal abscess at L4-5 since the prior study. 2. Increased enhancement of the inferior facets of L4 bilaterally consistent with osteomyelitis and septic arthritis of those facet joints. 3. No epidural abscess. 4. Slightly pus in the L4-5 disc space since the prior study. 5. No change in the small disc protrusion at L2-3 without neural impingement. 6. No new neural impingement since the prior study. Electronically Signed   By: JLorriane ShireM.D.   On: 04/13/2019 20:39    Scheduled Meds: . amLODipine  10 mg Oral Daily  . cephALEXin  500  mg Oral Daily  . cyclobenzaprine  10 mg Oral TID  . enoxaparin (LOVENOX) injection  40 mg Subcutaneous Q24H  . feeding  supplement (ENSURE ENLIVE)  237 mL Oral BID BM  . fentaNYL  1 patch Transdermal Q72H  . gabapentin  600 mg Oral BID  . hydrochlorothiazide  25 mg Oral Daily  . lidocaine  1 patch Transdermal Q24H  . mouth rinse  15 mL Mouth Rinse BID  . metoprolol tartrate  100 mg Oral BID  . multivitamin with minerals  1 tablet Oral Daily  . pantoprazole  40 mg Oral Daily  . polyethylene glycol  17 g Oral Daily   Continuous Infusions: . sodium chloride Stopped (04/13/19 0915)     LOS: 51 days    Time spent:   Vicenta Dunning, MD [facility]   If 7PM-7AM, please contact night-coverage

## 2019-04-18 ENCOUNTER — Encounter: Payer: Self-pay | Admitting: Internal Medicine

## 2019-04-18 ENCOUNTER — Other Ambulatory Visit: Payer: Self-pay

## 2019-04-18 ENCOUNTER — Ambulatory Visit: Payer: Self-pay | Attending: Internal Medicine | Admitting: Internal Medicine

## 2019-04-18 DIAGNOSIS — M4626 Osteomyelitis of vertebra, lumbar region: Secondary | ICD-10-CM

## 2019-04-18 DIAGNOSIS — G062 Extradural and subdural abscess, unspecified: Secondary | ICD-10-CM

## 2019-04-18 DIAGNOSIS — I10 Essential (primary) hypertension: Secondary | ICD-10-CM

## 2019-04-18 DIAGNOSIS — A4101 Sepsis due to Methicillin susceptible Staphylococcus aureus: Secondary | ICD-10-CM

## 2019-04-18 DIAGNOSIS — R6520 Severe sepsis without septic shock: Secondary | ICD-10-CM

## 2019-04-18 DIAGNOSIS — F1910 Other psychoactive substance abuse, uncomplicated: Secondary | ICD-10-CM

## 2019-04-18 NOTE — Progress Notes (Signed)
Virtual Visit via Telephone Note  I connected with Franklin Anderson on 04/18/19 at  3:30 PM EST by telephone and verified that I am speaking with the correct person using two identifiers.  Location: Patient: Brother's house Provider: home   I discussed the limitations, risks, security and privacy concerns of performing an evaluation and management service by telephone and the availability of in person appointments. I also discussed with the patient that there may be a patient responsible charge related to this service. The patient expressed understanding and agreed to proceed.   History of Present Illness: Hospital f/u sepsis, osteomyelitis, diskitis, septic pe, empyema, psoas abscesses, endocarditis. Difficulty walking due to back pain, eating normally, sleeping, regular bm, no f/c, pain slight improvement with muscle relaxant, top lidocaine, continues to take pain meds as prescribed. Scheduled  to get physical therapy tomorrow.    Observations/Objective:   Assessment and Plan: 1. Sepsis due to methicillin susceptible Staphylococcus aureus (MSSA) with acute organ dysfunction, unspecified type, unspecified whether septic shock present ([place]) Stable, check labs, f/u has with ID - Basic metabolic panel - CBC  2. Epidural abscess Continues to have back pain, continue pain meds, f/u w NS  3. Hypertension, unspecified type Continue same meds  4. Osteomyelitis of lumbar spine (HCC) Continue same tx,   5. Drug abuse ([place]) Advised rehab program to assist w weaning off pain meds   Follow Up Instructions: F/u w pcp in 2 weeks or sooner prn   I discussed the assessment and treatment plan with the patient. The patient was provided an opportunity to ask questions and all were answered. The patient agreed with the plan and demonstrated an understanding of the instructions.   The patient was advised to call back or seek an in-person evaluation if the symptoms worsen or if the condition fails  to improve as anticipated.  I provided 25 minutes of non-face-to-face time during this encounter.   Kimber Relic, MD

## 2019-04-18 NOTE — Progress Notes (Signed)
HFU- Sepsis  Pain wakes up during the night-  Back pain request RF

## 2019-04-19 ENCOUNTER — Telehealth: Payer: Self-pay | Admitting: Internal Medicine

## 2019-04-19 NOTE — Telephone Encounter (Signed)
Patient called stating that the provider said she would send him to pain management.

## 2019-04-24 ENCOUNTER — Encounter: Payer: Self-pay | Admitting: Internal Medicine

## 2019-04-24 ENCOUNTER — Other Ambulatory Visit: Payer: Self-pay

## 2019-04-24 ENCOUNTER — Ambulatory Visit (INDEPENDENT_AMBULATORY_CARE_PROVIDER_SITE_OTHER): Payer: Self-pay | Admitting: Internal Medicine

## 2019-04-24 ENCOUNTER — Ambulatory Visit (INDEPENDENT_AMBULATORY_CARE_PROVIDER_SITE_OTHER): Payer: Self-pay

## 2019-04-24 DIAGNOSIS — F17210 Nicotine dependence, cigarettes, uncomplicated: Secondary | ICD-10-CM

## 2019-04-24 DIAGNOSIS — I2690 Septic pulmonary embolism without acute cor pulmonale: Secondary | ICD-10-CM

## 2019-04-24 DIAGNOSIS — J869 Pyothorax without fistula: Secondary | ICD-10-CM

## 2019-04-24 IMAGING — DX DG CHEST 2V
2 series · 2 of 2 positions shown · non-contrast
Comparison: [DATE] [DATE]

CLINICAL DATA: 48-year-old male with a history of chest tube,
empyema, septic emboli

EXAM:
CHEST - 2 VIEW

[chest pa]
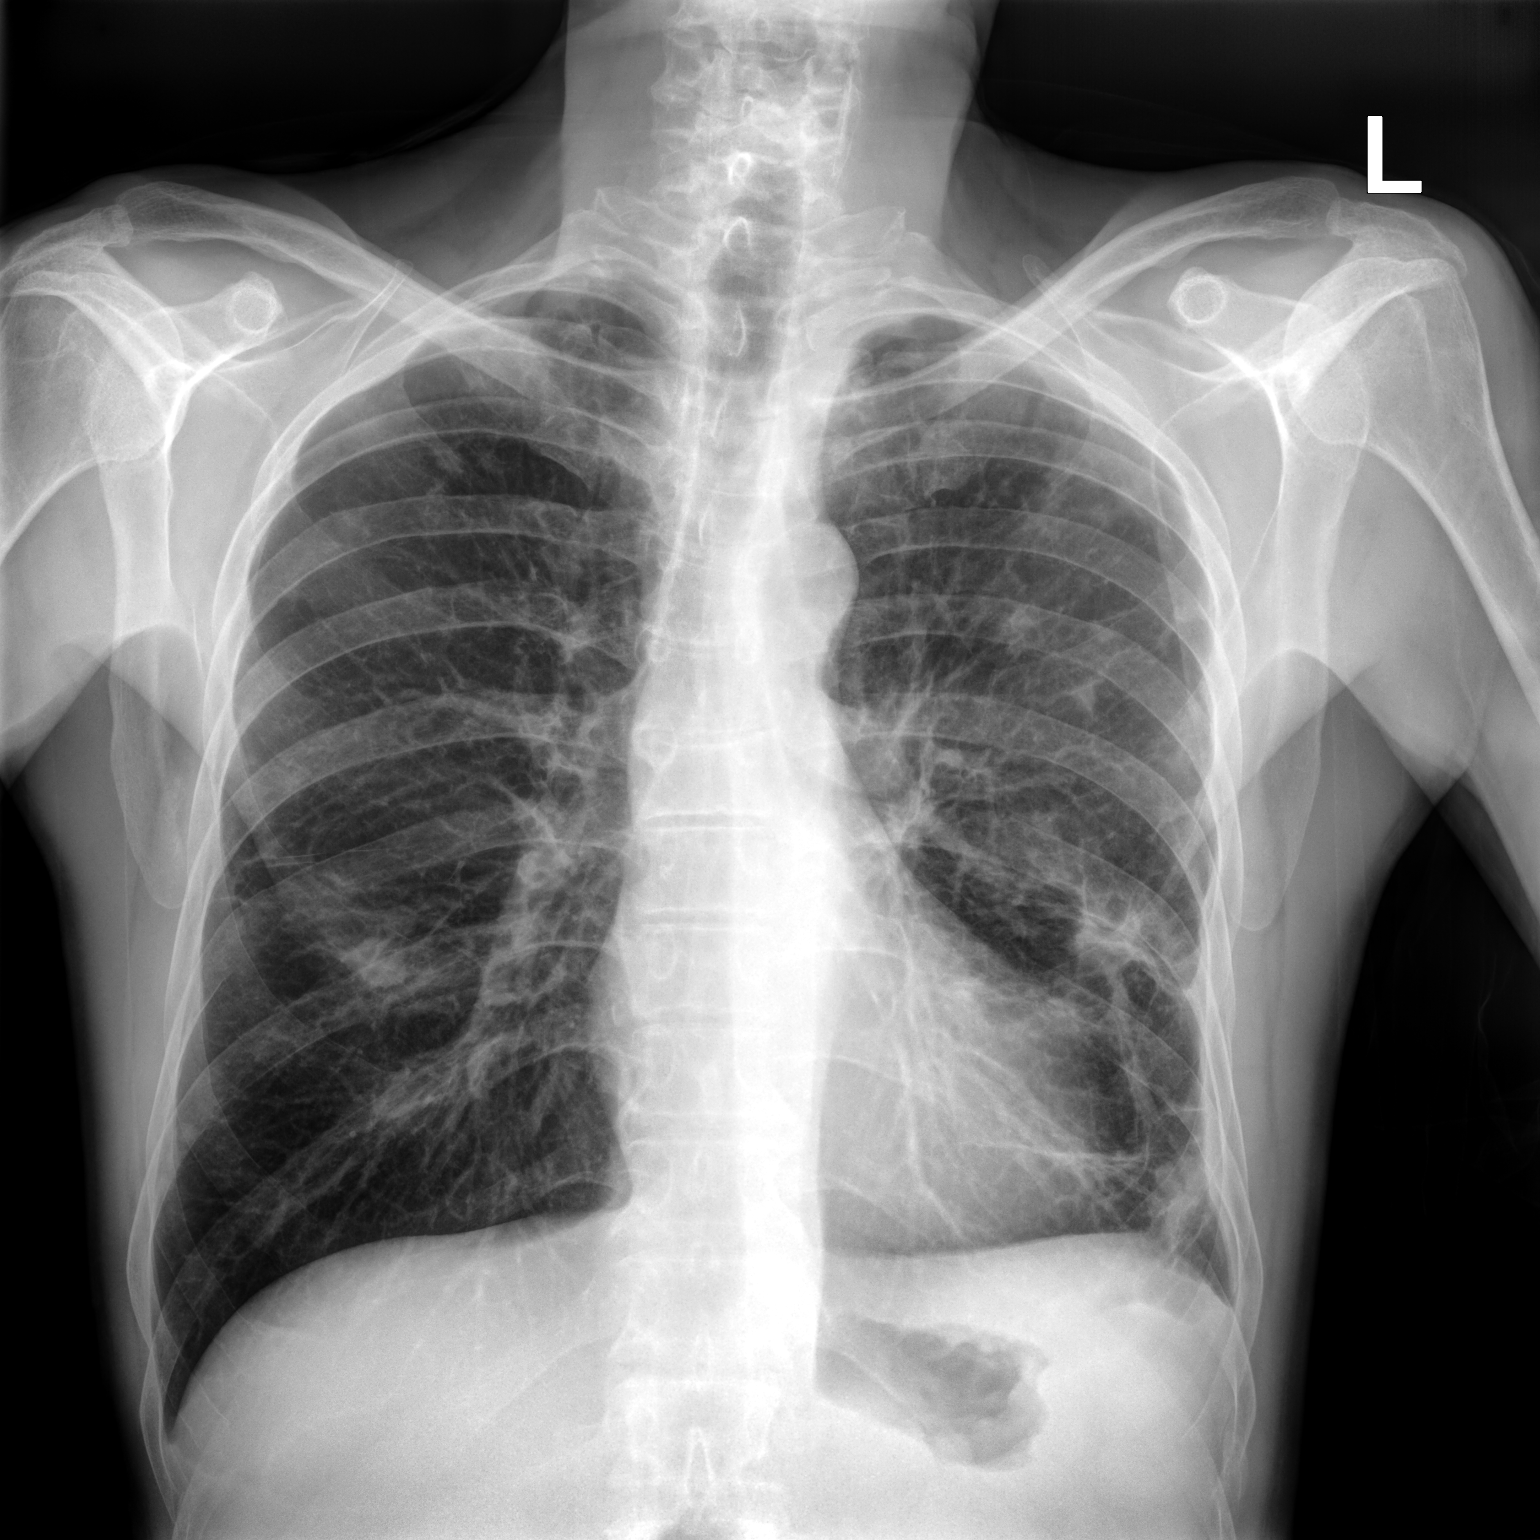

[chest lat]
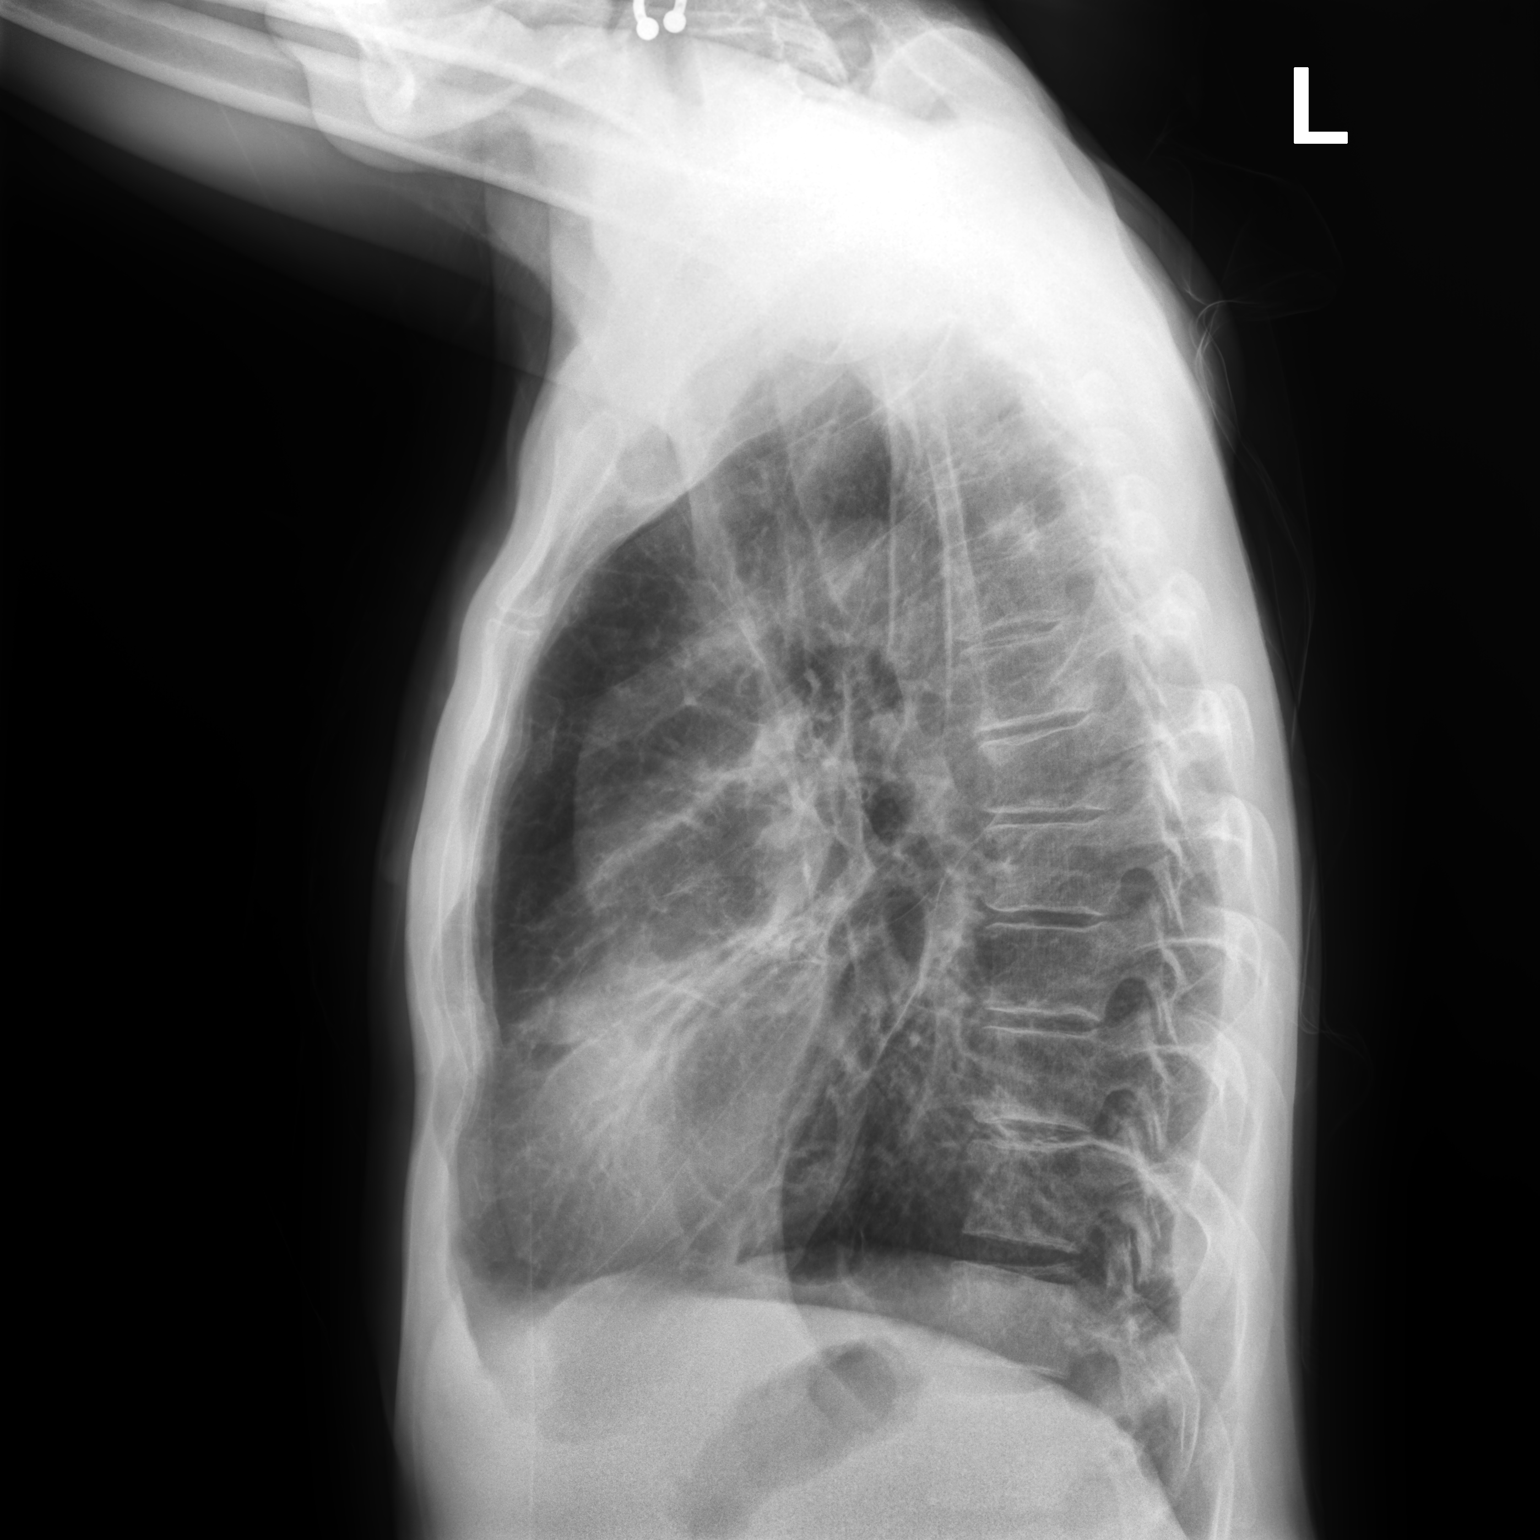

[2 of 2 positions shown; findings below may reference images not displayed]

FINDINGS: Cardiomediastinal silhouette unchanged in size and contour.

No pneumothorax.  No pleural effusion.

Similar distribution of bilateral lung nodules, with decreasing size
and conspicuity of the mid and lower lobe largest nodules. Similar
appearance of reticular opacities throughout the bilateral lungs
with linear opacities at the left lung base.

Degenerative changes of the thoracic spine with no acute displaced
fracture.
IMPRESSION: Improving appearance of bilateral lung nodules, with persisting
reticular opacities compatible with chronic lung changes/scarring
and no evidence of residual pneumothorax.

## 2019-04-24 NOTE — Progress Notes (Signed)
Franklin Anderson, male    DOB: 12/18/1970, 47 y.o.   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 Follow up with PCP in 1-2 weeks 2. Please obtain BMP/CBC in one week 3. Please follow up on the following pending results:  Home Health:  Equipment/Devices:  Discharge Condition: stable   CODE STATUS: full   Diet recommendation: Heart Healthy     Brief/Interim Summary:\   Franklin Anderson [id number] y.o.malewith medical history significant of rheumatic fever, HTN, ongoingIV heroin abuse, nicotine abuse, presented to the ED on 10/27 with 3 week course of progressively worsening back pain and BLE weakness associated with some bowel incontinence and trouble walking. Last use of heroin was on the day of admission. In ED > Found to have sepsis with Tm 100, HR 126, RR 31, WBC 22.4k,AKI with BUN 111 and creat 1.7. Imaging studies revealed osteomyelitis and diskitis of L4-L5,septic pulmonary emboli and psoas abscesses. He was started on IV antibiotics and admitted to [facility]. On 10/28 was found to be obtunded and dyspneic and was intubated. Blood cultures from admission grew out MSSA, subsequently found to have RV vegetation, left sided empyema (MSSA) and a left pneumothorax for which he received a chest tube.He also received a drain in his psoas abscess. Both chest tube and drain have been removed andID has signed off with instructions to continue IV antibiotics until 12/16.Patient on multiple pain meds due to high pain threshold (IV drug abuse)   Briefly, patient had an extended stay of about 52 days in the hospital whereby he finished almost 6-week antibiotic course starting with broad-spectrum vancomycin and cefepime and gradually narrowed down to Ancef until  04/12/2019.  He was later on switched to p.o. Keflex 5 mg 4 times a day as per ID recommendation due to small amount of persistent abscess as seen on repeat MRI along with facet osteomyelitis at L4 which was relatively new.  Patient did exhibit opiate medication seeking behavior, however with appropriate counseling, he was amenable to working with weaning down off opiates.  On December 18, he was felt to be at his new baseline.  Other than right leg weakness and chronic back pain, he did not have any complaints and so he was discharged home with home health care services and appropriate DME.  He was also given prescription for 7 days worth of opiate medication.  To be noted, family initially had asked for 1 or 2 more days to arrange for his stay at home.  However on 18 December, as per case manager note, they were reluctant to pick him up.  They were informed by case manager that in an unfortunate scenario, patient might be asked to leave the hospital since he is medically stable to be discharged.   Patient has been advised to follow-up with PCP, ID, neurosurgery, cardiology, pulmonary, pain clinic in 1 to 2 weeks for management of his complex medical situation.  Case manager has got an appointment with her PCP next week   During his stay ,  Sepsis with MSSA bacteremia, L4-L5 osteomyelitis and discitis, epidural abscess, psoas abscess, septic pulmonary emboli, empyema and right ventricular mural vegetation. - POA  Currently afebrile, with no leukocytosis. Right psoas abscess was aspirated on 10/28, culture positive for MSSA. Respiratory  culture 10/28 also MSSA 2D echo 10/28 showed possible mitral valve vegetation and moderate pericardial effusion. Underwent TEE on 11/3 showed medium sized, mobile mass attached to the trabeculation in the RV and moderate sized pericardial effusion Per CT surgery, not a good candidate for thoracotomy MRI on 11/17 showed increasing psoas abscess s/p catheter drainage of  right paraspinal abscess by IR on 11/18 ID recommended continue IV Ancef through 12/16.Discussed with ID on 04/14/2019 Dr Leonor Liv Comerabout new MRI findings of slight worsening of disc abscess and new facetal osteomyelitis at L4. He mentioned that this could be the evaluation of the septic process. He recommended increasing Keflex from once a day to 500 mg 4 times a day for at least 1 month and outpatient follow-up with ID  Pain management consists ofdiclofenac, fentanyl patch, Flexeril, as needed and oral oxycodone prn for breakthrough pain. Monitor for narcotic seeking behavior  Dilaudid discontinued on 12/16 . Increase Neurontin 600 mg twice a day to help with neuropathy pain  Acute respiratory failure with hypoxemia secondary to MSSA infection, septic pulmonary emboli, left empyema, left bronchopleural fistula (hydropneumothorax)Resolved, currently on RA Patient was intubated on 10/28-10/31 Underwent bronchoscopy with BAL, s/p left chest tube, removed on 11/17 Chest x-ray 12/2 showed bilateral pulmonary nodules, which are improved compared to prior exam on 11/19. Small residual left apical pneumothorax  Moderate pericardial effusion Seen on 2D echo and TEE, treated conservatively and improving Last echo on 11/9 showed mild effusion (improvement).  Essential hypertension BP stable Continue Norvasc, metoprolol  Severe protein calorie malnutrition Continue dietary supplements  HCV positive Per ID, seems to have cleared  Pressure injury POA  Sacrum with stage I, Right hip deep tissue injury Wound care per nursing  Acute Septic Encephalopathy due to bactermia POA - resolved    Mild hyperglycemai - a1c 4.5  Reactive tachycardia - from underlying endocarditis, pain . cx to monitor   Discharge Diagnoses:  Principal Problem:   Sepsis ([place]) Active Problems:   Dyspnea   Heroin use   Endocarditis   Septic pulmonary embolism (HCC)   Osteomyelitis of  thoracic spine (HCC)   Osteomyelitis of lumbar spine (HCC)   Acute encephalopathy   AKI (acute kidney injury) ([place])   Transaminitis   Psoas abscess (HCC)   Pressure injury of skin   Mucus clot in bronchi   Protein-calorie malnutrition, severe   Abnormal echocardiogram   MSSA bacteremia   Acute pulmonary embolism (HCC)   Pleural effusion on left   Empyema, left (HCC)   Chest tube in place   Pain   Pneumothorax on left   Fistula   Epidural abscess    History of Present Illness  04/24/2019  Pulmonary/ 1st office eval/Franklin Anderson s/p MSSA sepsis from IV drug use  Chief Complaint  Patient presents with  . Follow-up    Patient states breathing is ok. Not very active due to pain. Denies any coughing.  Dyspnea:  100 ft s stopping on rolling walker / limited by low back pain  Cough: none  Sleep: 30 degrees recliner  SABA use: does not have neb at all nor hfa but doesn't feel the need for it   No obvious day to day or daytime variability or assoc excess/ purulent sputum or mucus plugs or hemoptysis or cp or chest tightness, subjective wheeze or overt sinus or hb symptoms.   Sleeping as above without nocturnal  or early am exacerbation  of respiratory  c/o's or need for noct saba. Also denies any obvious  fluctuation of symptoms with weather or environmental changes or other aggravating or alleviating factors except as outlined above   No unusual exposure hx or h/o childhood pna/ asthma or knowledge of premature birth.  Current Allergies, Complete Past Medical History, Past Surgical History, Family History, and Social History were reviewed in [employer] record.  ROS  The following are not active complaints unless bolded Hoarseness, sore throat, dysphagia, dental problems, itching, sneezing,  nasal congestion or discharge of excess mucus or purulent secretions, ear ache,   fever, chills, sweats, unintended wt loss or wt gain, classically pleuritic or exertional cp,   orthopnea pnd or arm/hand swelling  or leg swelling, presyncope, palpitations, abdominal pain, anorexia, nausea, vomiting, diarrhea  or change in bowel habits or change in bladder habits, change in stools or change in urine, dysuria, hematuria,  rash, 2 visual complaints, headache, numbness, weakness or ataxia or problems with walking or coordination,  change in mood or  memory.           Past Medical History:  Diagnosis Date  . Drug abuse ([place])   . H/O: rheumatic fever    Diagnosed at 48 y/o.  Resolved at 48 y/o.  States he was diagnosed with heart murmur felt due to this  . Hypertension 2012    Outpatient Medications Prior to Visit  Medication Sig Dispense Refill  . acetaminophen (TYLENOL) 325 MG tablet Take 2 tablets (650 mg total) by mouth every 6 (six) hours as needed for mild pain (or Fever >/= 101). 90 tablet 0  . amLODipine (NORVASC) 10 MG tablet Take 1 tablet (10 mg total) by mouth daily. 30 tablet 0  . cephALEXin (KEFLEX) 500 MG capsule Take 1 capsule (500 mg total) by mouth 4 (four) times daily. 120 capsule 0  . cyclobenzaprine (FLEXERIL) 10 MG tablet Take 1 tablet (10 mg total) by mouth 3 (three) times daily as needed for muscle spasms. 90 tablet 0  . diclofenac (VOLTAREN) 50 MG EC tablet Take 1 tablet (50 mg total) by mouth 2 (two) times daily for 15 days. 30 tablet 0  . feeding supplement, ENSURE ENLIVE, (ENSURE ENLIVE) LIQD Take 237 mLs by mouth 2 (two) times daily between meals. 237 mL 12  . gabapentin (NEURONTIN) 300 MG capsule Take 2 capsules (600 mg total) by mouth 2 (two) times daily. 120 capsule 0  . hydrochlorothiazide (HYDRODIURIL) 25 MG tablet Take 1 tablet (25 mg total) by mouth daily. 30 tablet 0  . lidocaine (LIDODERM) 5 % Place 1 patch onto the skin daily. Remove & Discard patch within 12 hours or as directed by MD 30 patch 0  . metoprolol tartrate (LOPRESSOR) 100 MG tablet Take 1 tablet (100 mg total) by mouth 2 (two) times daily. 60 tablet 0  . Multiple Vitamin  (MULTIVITAMIN WITH MINERALS) TABS tablet Take 1 tablet by mouth daily.    . naproxen sodium (ALEVE) 220 MG tablet Take 220 mg by mouth daily as needed.    . ondansetron (ZOFRAN) 4 MG tablet Take 1 tablet (4 mg total) by mouth every 6 (six) hours as needed for nausea. 20 tablet 0  . pantoprazole (PROTONIX) 40 MG tablet Take 1 tablet (40 mg total) by mouth daily. 30 tablet 0  . polyethylene glycol (MIRALAX / GLYCOLAX) 17 g packet Take 17 g by mouth daily. 14 each 0  . senna-docusate (SENOKOT-S) 8.6-50 MG tablet Take 2 tablets by mouth at bedtime as needed for mild constipation or moderate constipation. 30 tablet 0  .  clonazePAM (KLONOPIN) 0.5 MG tablet Take 1 tablet (0.5 mg total) by mouth 3 (three) times daily as needed for up to 7 days (anxiety). 30 tablet 0  .    0      Objective:     BP 134/86 (BP Location: Left Arm, Patient Position: Sitting, Cuff Size: Large)   Pulse 81   Temp 98.1 F (36.7 C) (Temporal)   Ht 6' (1.829 m)   Wt 128 lb 6.4 oz (58.2 kg)   SpO2 99%   BMI 17.41 kg/m   SpO2: 99 %   amb with walker/ nad   HEENT : pt wearing mask not removed for exam due to covid -19 concerns.    NECK :  without JVD/Nodes/TM/ nl carotid upstrokes bilaterally   LUNGS: no acc muscle use,  Nl contour chest which is clear to A and P bilaterally without cough on insp or exp maneuvers   CV:  RRR  no s3 or murmur or increase in P2, and no edema   ABD:  soft and nontender with nl inspiratory excursion in the supine position. No bruits or organomegaly appreciated, bowel sounds nl  MS:  Nl gait/ ext warm without deformities, calf tenderness, cyanosis or clubbing No obvious joint restrictions   SKIN: warm and dry without lesions  - chest wound sites have healed nicely    NEURO:  alert, approp, nl sensorium with  no motor or cerebellar deficits apparent.    CXR PA and Lateral:   04/24/2019 :    I personally reviewed images and agree with radiology impression as follows:   Improving  appearance of bilateral lung nodules, with persisting reticular opacities compatible with chronic lung changes/scarring and no evidence of residual pneumothorax.      Assessment   No problem-specific Assessment & Plan notes found for this encounter.     Christinia Gully, MD 04/24/2019

## 2019-04-24 NOTE — Patient Instructions (Addendum)
The key is to stop smoking completely before smoking completely stops you!  Please remember to go to the  x-ray department  for your tests - we will call you with the results when they are available     Pulmonary follow up can be as needed for breathing problems or cough

## 2019-04-25 ENCOUNTER — Encounter: Payer: Self-pay | Admitting: Internal Medicine

## 2019-04-25 NOTE — Assessment & Plan Note (Addendum)
He has a few residual nodules but the overall pattern is strikingly improved from his admit studies   >>> f/u by ID planned   He is on no pulmonary meds at this point and more limited by back. Should he begin to be more limited by breathing he was advised to return to clinic for pfts/ trial of maint laba/lama if qualifies. hospt    I had an extended discussion with the patient reviewing all relevant studies completed to date and  lasting 15 to 20 minutes of a 25 minute visit    Each maintenance medication was reviewed in detail including most importantly the difference between maintenance and prns and under what circumstances the prns are to be triggered using an action plan format that is not reflected in the computer generated alphabetically organized AVS.     Please see AVS for specific instructions unique to this visit that I personally wrote and verbalized to the the pt in detail and then reviewed with pt  by my nurse highlighting any  changes in therapy recommended at today's visit to their plan of care.

## 2019-04-25 NOTE — Assessment & Plan Note (Signed)
No significant residual empyema s/p prolonged drainage for hydroptx.  No pulmonary f/u needed

## 2019-04-25 NOTE — Assessment & Plan Note (Signed)
Counseled re importance of smoking cessation but did not meet time criteria for separate billing   °

## 2019-05-01 ENCOUNTER — Ambulatory Visit: Payer: Self-pay | Admitting: Infectious Disease

## 2019-05-03 ENCOUNTER — Encounter: Payer: Self-pay | Admitting: Infectious Disease

## 2019-05-03 ENCOUNTER — Ambulatory Visit (INDEPENDENT_AMBULATORY_CARE_PROVIDER_SITE_OTHER): Payer: Self-pay | Admitting: Infectious Disease

## 2019-05-03 ENCOUNTER — Telehealth: Payer: Self-pay | Admitting: Pharmacy Technician

## 2019-05-03 ENCOUNTER — Other Ambulatory Visit: Payer: Self-pay

## 2019-05-03 VITALS — BP 160/90 | HR 84 | Ht 72.0 in | Wt 136.0 lb

## 2019-05-03 DIAGNOSIS — B9561 Methicillin susceptible Staphylococcus aureus infection as the cause of diseases classified elsewhere: Secondary | ICD-10-CM

## 2019-05-03 DIAGNOSIS — I330 Acute and subacute infective endocarditis: Secondary | ICD-10-CM

## 2019-05-03 DIAGNOSIS — F1910 Other psychoactive substance abuse, uncomplicated: Secondary | ICD-10-CM

## 2019-05-03 DIAGNOSIS — F1120 Opioid dependence, uncomplicated: Secondary | ICD-10-CM

## 2019-05-03 DIAGNOSIS — M4626 Osteomyelitis of vertebra, lumbar region: Secondary | ICD-10-CM

## 2019-05-03 DIAGNOSIS — F1190 Opioid use, unspecified, uncomplicated: Secondary | ICD-10-CM

## 2019-05-03 DIAGNOSIS — G062 Extradural and subdural abscess, unspecified: Secondary | ICD-10-CM

## 2019-05-03 DIAGNOSIS — L988 Other specified disorders of the skin and subcutaneous tissue: Secondary | ICD-10-CM

## 2019-05-03 DIAGNOSIS — Z23 Encounter for immunization: Secondary | ICD-10-CM

## 2019-05-03 DIAGNOSIS — R7881 Bacteremia: Secondary | ICD-10-CM

## 2019-05-03 DIAGNOSIS — J869 Pyothorax without fistula: Secondary | ICD-10-CM

## 2019-05-03 MED ORDER — CEPHALEXIN 500 MG PO CAPS: 500.0000 mg | ORAL_CAPSULE | Freq: Four times a day (QID) | ORAL | 6 refills | Status: AC

## 2019-05-03 MED FILL — CEPHALEXIN 500 MG CAPSULE: 500 | 30 days supply | Qty: 120 | Fill #0

## 2019-05-03 NOTE — Telephone Encounter (Signed)
[facility] Patient Advocate Encounter  Patient's medication has been transferred to [facility] due to cost. With GoodRx it was more expensive at [facility]. His out of pocket cost is $9.47 for 120 capsules. Patient knows to call if he runs into any problems obtaining the medication from the new pharmacy.  Beulah Gandy, CPhT [facility] Patient [facility] Phone: [phone] Fax: [phone] 05/03/2019 1:53 PM
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
## 2019-05-04 LAB — CBC WITH DIFFERENTIAL/PLATELET
Absolute Monocytes: 508 cells/uL (ref 200–950)
Basophils Absolute: 16 cells/uL (ref 0–200)
Basophils Relative: 0.2 %
Eosinophils Absolute: 205 cells/uL (ref 15–500)
Eosinophils Relative: 2.5 %
HCT: 35.3 % — ABNORMAL LOW (ref 38.5–50.0)
Hemoglobin: 11.8 g/dL — ABNORMAL LOW (ref 13.2–17.1)
Lymphs Abs: 2837 cells/uL (ref 850–3900)
MCH: 30.3 pg (ref 27.0–33.0)
MCHC: 33.4 g/dL (ref 32.0–36.0)
MCV: 90.5 fL (ref 80.0–100.0)
MPV: 9.5 fL (ref 7.5–12.5)
Monocytes Relative: 6.2 %
Neutro Abs: 4633 cells/uL (ref 1500–7800)
Neutrophils Relative %: 56.5 %
Platelets: 392 10*3/uL (ref 140–400)
RBC: 3.9 10*6/uL — ABNORMAL LOW (ref 4.20–5.80)
RDW: 13.8 % (ref 11.0–15.0)
Total Lymphocyte: 34.6 %
WBC: 8.2 10*3/uL (ref 3.8–10.8)

## 2019-05-04 LAB — BASIC METABOLIC PANEL WITH GFR
BUN/Creatinine Ratio: 28 (calc) — ABNORMAL HIGH (ref 6–22)
BUN: 13 mg/dL (ref 7–25)
CO2: 24 mmol/L (ref 20–32)
Calcium: 9.6 mg/dL (ref 8.6–10.3)
Chloride: 107 mmol/L (ref 98–110)
Creat: 0.47 mg/dL — ABNORMAL LOW (ref 0.60–1.35)
GFR, Est African American: 151 mL/min/{1.73_m2} (ref >=60)
GFR, Est Non African American: 131 mL/min/{1.73_m2} (ref >=60)
Glucose, Bld: 92 mg/dL (ref 65–99)
Potassium: 4.5 mmol/L (ref 3.5–5.3)
Sodium: 139 mmol/L (ref 135–146)

## 2019-05-04 LAB — SEDIMENTATION RATE: Sed Rate: 25 mm/h — ABNORMAL HIGH (ref 0–15)

## 2019-05-04 LAB — C-REACTIVE PROTEIN: CRP: 2.1 mg/L (ref <8.0)

## 2019-05-05 ENCOUNTER — Ambulatory Visit: Payer: Self-pay | Attending: Family | Admitting: Family

## 2019-05-05 ENCOUNTER — Other Ambulatory Visit: Payer: Self-pay

## 2019-05-05 DIAGNOSIS — M4626 Osteomyelitis of vertebra, lumbar region: Secondary | ICD-10-CM

## 2019-05-05 NOTE — Progress Notes (Signed)
Virtual Visit via Telephone Note  I connected with Franklin Anderson, on 05/05/2019 at 2:08 PM by telephone due to the COVID-19 pandemic and verified that I am speaking with the correct person using two identifiers.   Consent: I discussed the limitations, risks, security and privacy concerns of performing an evaluation and management service by telephone and the availability of in person appointments. I also discussed with the patient that there may be a patient responsible charge related to this service. The patient expressed understanding and agreed to proceed.  Due to current restrictions/limitations of in-office visits due to the COVID-19 pandemic, this scheduled clinical appointment was converted to a telehealth visit  Location of Patient: The patient is at home.  Location of Provider: Clinic  Persons participating in Telemedicine visit: Franklin Anderson, [place], NP   History of Present Illness: Franklin Anderson is a 49 year-old Caucasian male with past history osteomyelitis of thoracic spine, hypertension, endocarditis, septic pulmonary embolism, and acute kidney injury who presents today with concern of not being able to walk.  1. Back Pain & Difficulty Walking: Difficulty walking for the past 10 days. Describes pain at the lower back with a shooting sharp pain into the bilateral pelvic area. Rates pain 9/10 for at least the first 5 hours of the day upon awakening. Sleeping makes pain worse as he is resting in the same position throughout the night which causes pain in the early morning hours upon awakening. Sleeps 3 or 4 hours per night because of discomfort.   Ambulation with walker required all day since December 2020. Able to walk 10 feet without the walker. Decreased flexibility and range of motion of back with difficulty bending, arching, or twisting. Nothing has proven to make back pain better.   Admits stiffness. Denies past back surgeries. 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 Denies fever and weight loss. Completes daily feet and leg exercises taught to him by physical therapists.   Hospitalized from February 21, 2019 through April 14, 2019 (attending Dr. Harriette Ohara) related to sepsis. During the same hospitalization patient was treated for osteomyelitis of the lumbar spine with Oxycodone 5-10 mg by mouth every 6 hours as needed; and Cyclobenzaprine 10 mg by mouth 3 times per day. Upon discharge patient states that he was given a 1 week supply of Oxycodone and Klonopin. After those medications ran out patient began taking Naproxen and Acetaminophen on an alternating schedule for pain management with no relief. April 19, 2019 referred by Dr. Amil Amen to pain medication clinic of which he was unable to successfully make an appointment. When he called the pain management clinic he was told that they had not received his referral and therefore could not book his appointment. Also expresses concerns because he does not have health insurance.   Requesting refill of Cyclobenzaprine, pain medication, and referral to pain management clinic.  Past Medical History:  Diagnosis Date  . Drug abuse ([facility])   . H/O: rheumatic fever    Diagnosed at 49 y/o.  Resolved at 49 y/o.  States he was diagnosed with heart murmur felt due to this  . Hypertension 2012   Allergies  Allergen Reactions  . Penicillins Other (See Comments)    Current Outpatient Medications on File Prior to Visit  Medication Sig Dispense Refill  . acetaminophen (TYLENOL) 325 MG tablet Take 2 tablets (650 mg total) by mouth every 6 (six) hours as needed for mild  pain (or Fever >/= 101). 90 tablet 0  . amLODipine (NORVASC) 10 MG tablet Take 1 tablet (10 mg total) by mouth daily. 30 tablet 0  . cephALEXin (KEFLEX) 500 MG capsule Take 1 capsule (500 mg total) by mouth 4 (four) times daily. 120 capsule 6  . clonazePAM (KLONOPIN) 0.5 MG tablet Take 1 tablet  (0.5 mg total) by mouth 3 (three) times daily as needed for up to 7 days (anxiety). (Patient not taking: Reported on 05/03/2019) 30 tablet 0  . cyclobenzaprine (FLEXERIL) 10 MG tablet Take 1 tablet (10 mg total) by mouth 3 (three) times daily as needed for muscle spasms. (Patient not taking: Reported on 05/03/2019) 90 tablet 0  . feeding supplement, ENSURE ENLIVE, (ENSURE ENLIVE) LIQD Take 237 mLs by mouth 2 (two) times daily between meals. 237 mL 12  . gabapentin (NEURONTIN) 300 MG capsule Take 2 capsules (600 mg total) by mouth 2 (two) times daily. 120 capsule 0  . hydrochlorothiazide (HYDRODIURIL) 25 MG tablet Take 1 tablet (25 mg total) by mouth daily. 30 tablet 0  . lidocaine (LIDODERM) 5 % Place 1 patch onto the skin daily. Remove & Discard patch within 12 hours or as directed by MD 30 patch 0  . metoprolol tartrate (LOPRESSOR) 100 MG tablet Take 1 tablet (100 mg total) by mouth 2 (two) times daily. 60 tablet 0  . Multiple Vitamin (MULTIVITAMIN WITH MINERALS) TABS tablet Take 1 tablet by mouth daily.    . naproxen sodium (ALEVE) 220 MG tablet Take 220 mg by mouth daily as needed.    . ondansetron (ZOFRAN) 4 MG tablet Take 1 tablet (4 mg total) by mouth every 6 (six) hours as needed for nausea. 20 tablet 0  . pantoprazole (PROTONIX) 40 MG tablet Take 1 tablet (40 mg total) by mouth daily. 30 tablet 0   No current facility-administered medications on file prior to visit.    Observations/Objective: Patient is alert and oriented x 3.  Not in acute distress.  Assessment and Plan: 1. Osteomyelitis of Lumbar Spine: -Referral to pain clinic. Upon review referral to pain clinic in December 2020 was not submitted which prevented patient from making appointment. Referral to pain management submitted today pending approval and processing. -Requests for Oxycodone and Klonopin will not be refilled during this encounter. Also, Klonopin is not on the medication list for the October 2020 to December 2020  hospitalization. Oxycodone was present. -Request for refill of Cyclobenzaprine (Flexeril) will not be refilled during this encounter. Current Flexeril prescription lasts until May 14, 2019. -Pain medication not ordered. Patient refusing order of Naproxen for pain management.  Follow Up Instructions: Return for management of chronic conditions with PCP. Keep all appointments with doctor. Return sooner if symptoms worsen or persist.   I discussed the assessment and treatment plan with the patient. The patient was provided an opportunity to ask questions and all were answered. The patient agreed with the plan and demonstrated an understanding of the instructions.   The patient was advised to call back or seek an in-person evaluation if the symptoms worsen or if the condition fails to improve as anticipated.  I provided 30 minutes total of non-face-to-face time during this encounter including median intraservice time, reviewing previous notes, labs, imaging, medications, management and patient verbalized understanding.  Rema Fendt, NP  [facility] and [facility] [place], [place] [phone]   05/05/2019, 8:06 PM

## 2019-05-05 NOTE — Patient Instructions (Signed)
Referral to pain clinic. Keep appointments with primary care doctors and specialists doctors. Osteomyelitis, Adult  Bone infections (osteomyelitis) occur when bacteria or other germs get inside a bone. This can happen if you have an infection in another part of your body that spreads through your blood. Germs from your skin or from outside of your body can also cause this type of infection if you have a wound or a broken bone (fracture) that breaks the skin. Bone infections need to be treated quickly to prevent bone damage and to prevent the infection from spreading to other areas of your body. What are the causes? Most bone infections are caused by bacteria. They can also be caused by other germs, such as viruses and funguses. What increases the risk? You are more likely to develop this condition if you:  Recently had surgery, especially bone or joint surgery.  Have a long-term (chronic) disease, such as: ? Diabetes. ? HIV (human immunodeficiency virus). ? Rheumatoid arthritis. ? Sickle cell anemia. ? Kidney disease that requires dialysis.  Are aged 5 years or older.  Have a condition or take medicines that block or weaken your body's defense system (immune system).  Have a condition that reduces your blood flow.  Have an artificial joint.  Have had a joint or bone repaired with plates or screws (surgical hardware).  Use IV drugs.  Have a central line for IV access.  Have had trauma, such as stepping on a nail or a broken bone that came through the skin. What are the signs or symptoms? Symptoms vary depending on the type and location of your infection. Common symptoms of bone infections include:  Fever and chills.  Skin redness and warmth.  Swelling.  Pain and stiffness.  Drainage of fluid or pus near the infection. How is this diagnosed? This condition may be diagnosed based on:  Your symptoms and medical history.  A physical exam.  Tests, such as: ? A sample  of tissue, fluid, or blood taken to be examined under a microscope. ? Pus or discharge swabbed from a wound for testing to identify germs and to determine what type of medicine will kill them (culture and sensitivity). ? Blood tests.  Imaging studies. These may include: ? X-rays. ? MRI. ? CT scan. ? Bone scan. ? Ultrasound. How is this treated? Treatment for this condition depends on the cause and type of infection. Antibiotic medicines are usually the first treatment for a bone infection. This may be done in a hospital at first. You may have to continue IV antibiotics at home or take antibiotics by mouth for several weeks after that. Other treatments may include surgery to remove:  Dead or dying tissue from a bone.  An infected artificial joint.  Infected plates or screws that were used to repair a broken bone. Follow these instructions at home: Medicines   Take over-the-counter and prescription medicines only as told by your health care provider.  Take your antibiotic medicine as told by your health care provider. Do not stop taking the antibiotic even if you start to feel better.  Follow instructions from your health care provider about how to take IV antibiotics at home. You may need to have a nurse come to your home to give you the IV antibiotics. General instructions   Ask your health care provider if you have any restrictions on your activities.  If directed, put ice on the affected area: ? Put ice in a plastic bag. ? Place a towel  between your skin and the bag. ? Leave the ice on for 20 minutes, 2-3 times a day.  Wash your hands often with soap and water. If soap and water are not available, use hand sanitizer.  Do not use any products that contain nicotine or tobacco, such as cigarettes and e-cigarettes. These can delay bone healing. If you need help quitting, ask your health care provider.  Keep all follow-up visits as told by your health care provider. This is  important. Contact a health care provider if:  You develop a fever or chills.  You have redness, warmth, pain, or swelling that returns after treatment. Get help right away if:  You have rapid breathing or you have trouble breathing.  You have chest pain.  You cannot drink fluids or make urine.  The affected area swells, changes color, or turns blue.  You have numbness or severe pain in the affected area. Summary  Bone infections (osteomyelitis) occur when bacteria or other germs get inside a bone.  You may be more likely to get this type of infection if you have a condition, such as diabetes, that lowers your ability to fight infection or increases your chances of getting an infection.  Most bone infections are caused by bacteria. They can also be caused by other germs, such as viruses and funguses.  Treatment for this condition usually starts with taking antibiotics. Further treatment depends on the cause and type of infection. This information is not intended to replace advice given to you by your health care provider. Make sure you discuss any questions you have with your health care provider. Document Revised: 04/29/2017 Document Reviewed: 04/22/2017 Elsevier Patient Education  2020 [employer].

## 2019-05-16 ENCOUNTER — Emergency Department (HOSPITAL_COMMUNITY)
Admission: EM | Admit: 2019-05-16 | Discharge: 2019-05-17 | Disposition: A | Discharge status: Home / Self Care | Payer: Self-pay | Attending: Emergency Medicine | Admitting: Emergency Medicine

## 2019-05-16 ENCOUNTER — Encounter (HOSPITAL_COMMUNITY): Payer: Self-pay | Admitting: Emergency Medicine

## 2019-05-16 ENCOUNTER — Other Ambulatory Visit: Payer: Self-pay

## 2019-05-16 ENCOUNTER — Emergency Department (HOSPITAL_COMMUNITY): Payer: Self-pay

## 2019-05-16 DIAGNOSIS — I10 Essential (primary) hypertension: Secondary | ICD-10-CM | POA: Insufficient documentation

## 2019-05-16 DIAGNOSIS — M545 Low back pain, unspecified: Secondary | ICD-10-CM

## 2019-05-16 DIAGNOSIS — F17210 Nicotine dependence, cigarettes, uncomplicated: Secondary | ICD-10-CM | POA: Insufficient documentation

## 2019-05-16 DIAGNOSIS — M4626 Osteomyelitis of vertebra, lumbar region: Secondary | ICD-10-CM | POA: Insufficient documentation

## 2019-05-16 LAB — COMPREHENSIVE METABOLIC PANEL
ALT: 12 U/L (ref 0–44)
AST: 15 U/L (ref 15–41)
Albumin: 4 g/dL (ref 3.5–5.0)
Alkaline Phosphatase: 84 U/L (ref 38–126)
Anion gap: 8 (ref 5–15)
BUN: 23 mg/dL — ABNORMAL HIGH (ref 6–20)
CO2: 25 mmol/L (ref 22–32)
Calcium: 9.2 mg/dL (ref 8.9–10.3)
Chloride: 107 mmol/L (ref 98–111)
Creatinine, Ser: 0.53 mg/dL — ABNORMAL LOW (ref 0.61–1.24)
GFR calc Af Amer: >60 mL/min (ref >60)
GFR calc non Af Amer: >60 mL/min (ref >60)
Glucose, Bld: 104 mg/dL — ABNORMAL HIGH (ref 70–99)
Potassium: 4.1 mmol/L (ref 3.5–5.1)
Sodium: 140 mmol/L (ref 135–145)
Total Bilirubin: 0.5 mg/dL (ref 0.3–1.2)
Total Protein: 7.8 g/dL (ref 6.5–8.1)

## 2019-05-16 LAB — CBC WITH DIFFERENTIAL/PLATELET
Abs Immature Granulocytes: 0.03 10*3/uL (ref 0.00–0.07)
Basophils Absolute: 0 10*3/uL (ref 0.0–0.1)
Basophils Relative: 0 %
Eosinophils Absolute: 0.3 10*3/uL (ref 0.0–0.5)
Eosinophils Relative: 3 %
HCT: 42.6 % (ref 39.0–52.0)
Hemoglobin: 14.5 g/dL (ref 13.0–17.0)
Immature Granulocytes: 0 %
Lymphocytes Relative: 36 %
Lymphs Abs: 3.3 10*3/uL (ref 0.7–4.0)
MCH: 31.9 pg (ref 26.0–34.0)
MCHC: 34 g/dL (ref 30.0–36.0)
MCV: 93.8 fL (ref 80.0–100.0)
Monocytes Absolute: 0.6 10*3/uL (ref 0.1–1.0)
Monocytes Relative: 6 %
Neutro Abs: 4.9 10*3/uL (ref 1.7–7.7)
Neutrophils Relative %: 55 %
Platelets: 349 10*3/uL (ref 150–400)
RBC: 4.54 MIL/uL (ref 4.22–5.81)
RDW: 15.3 % (ref 11.5–15.5)
WBC: 9.1 10*3/uL (ref 4.0–10.5)
nRBC: 0 % (ref 0.0–0.2)

## 2019-05-16 LAB — LACTIC ACID, PLASMA: Lactic Acid, Venous: 1.3 mmol/L (ref 0.5–1.9)

## 2019-05-16 IMAGING — MR MR LUMBAR SPINE WO/W CM
4 of 9 series · 19 of 48 positions shown · IV contrast (Yes)
Comparison: [DATE]

CLINICAL DATA: Low back pain

EXAM:
MRI LUMBAR SPINE WITHOUT AND WITH CONTRAST
TECHNIQUE: Multiplanar and multiecho pulse sequences of the lumbar spine were
obtained without and with intravenous contrast.
CONTRAST:  6mL GADAVIST GADOBUTROL 1 MMOL/ML IV SOLN

[Series 3: T1 · sagittal · 4.0mm · 0.51mm/px · 3 of 15 slices shown (1 of 2)]
[im 1/15]
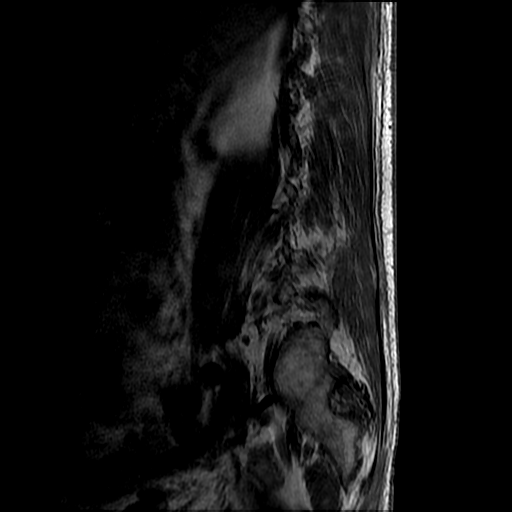
[im 8/15]
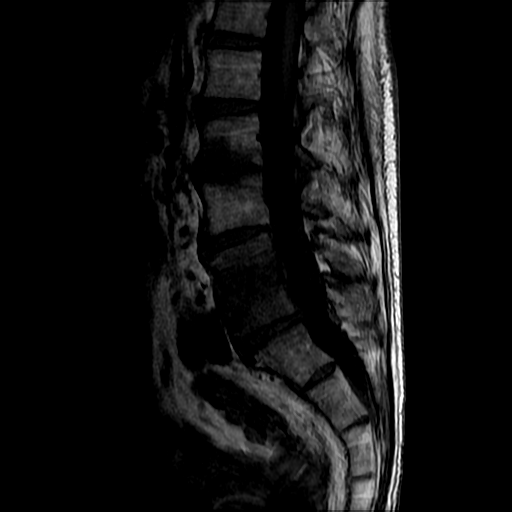
[im 15/15]
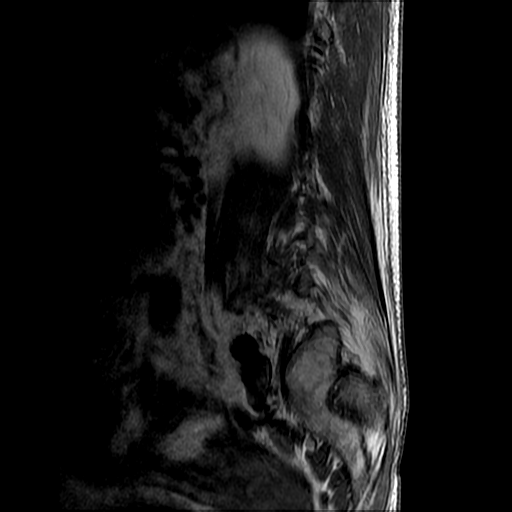

[Series 5: T2 · axial · 4.0mm · 0.39mm/px · z∈[-80,+104]mm · 7 of 38 slices shown]
[im 1/38]
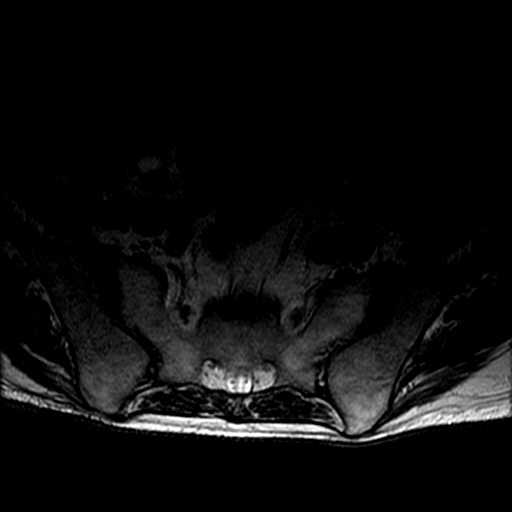
[im 7/38]
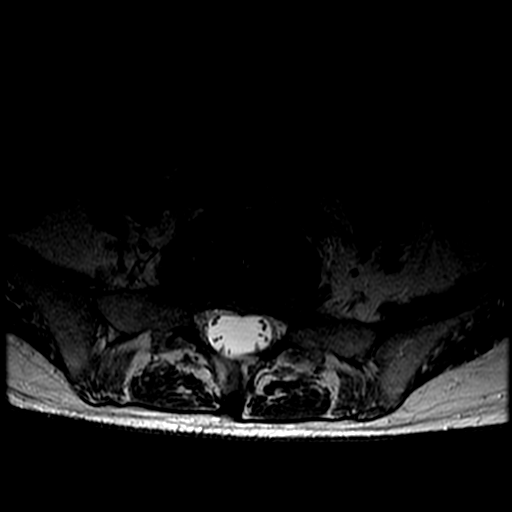
[im 13/38]
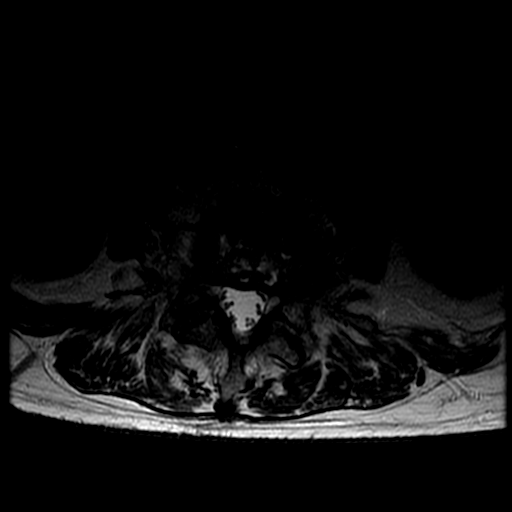
[im 19/38]
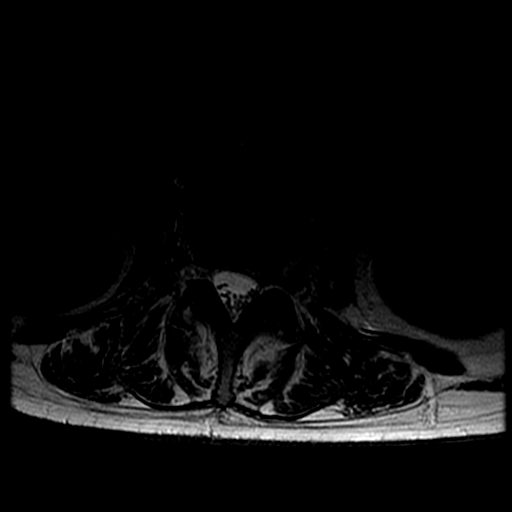
[im 25/38]
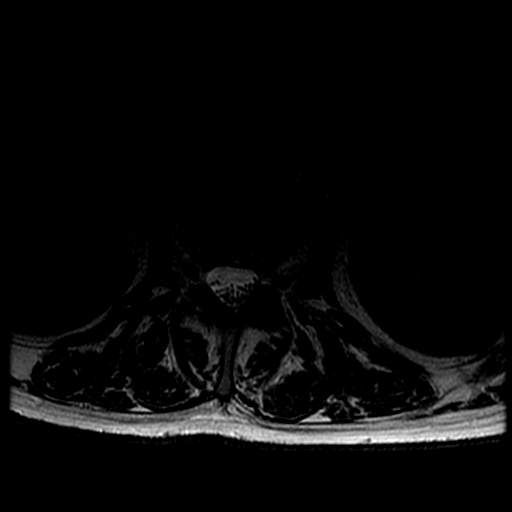
[im 31/38]
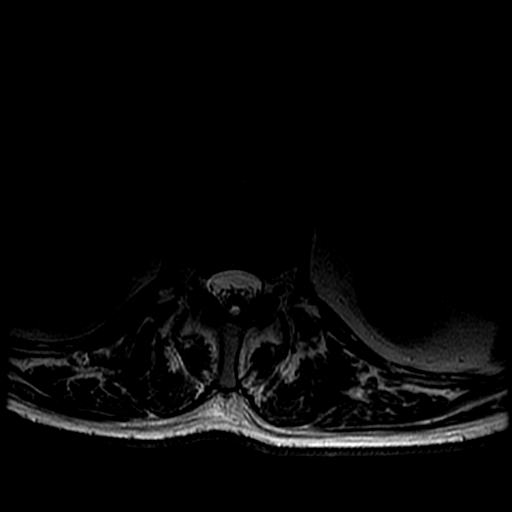
[im 38/38]
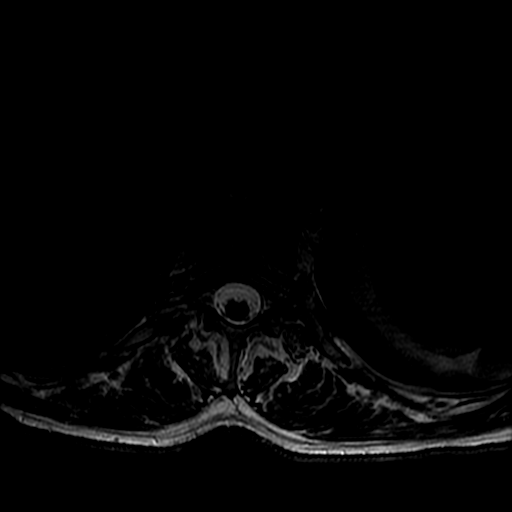

[Series 6: T1 · axial · 4.0mm · 0.39mm/px · z∈[-80,+69]mm · 6 of 38 slices shown (2 of 2)]
[im 1/38]
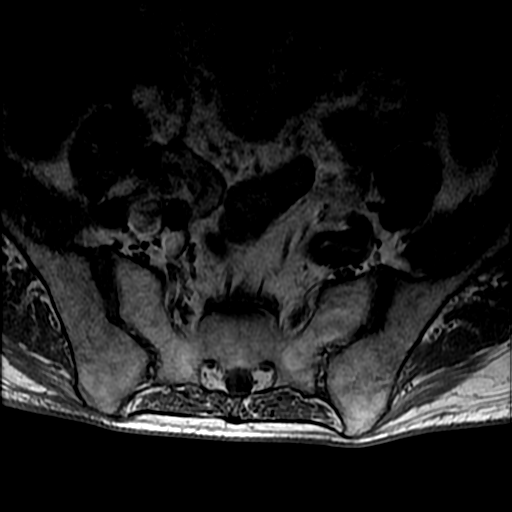
[im 7/38]
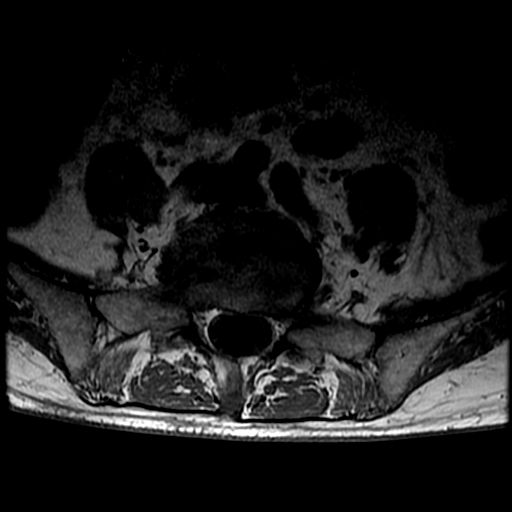
[im 13/38]
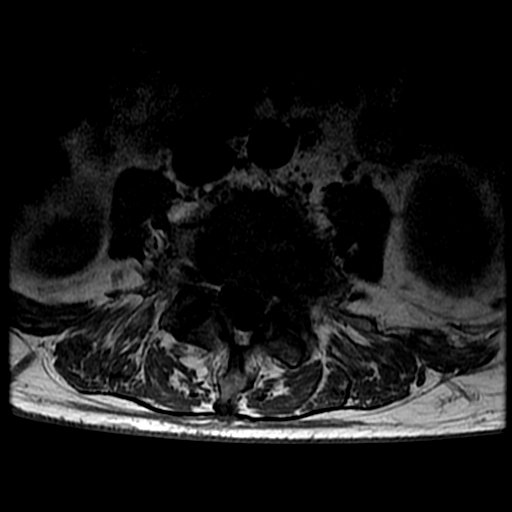
[im 19/38]
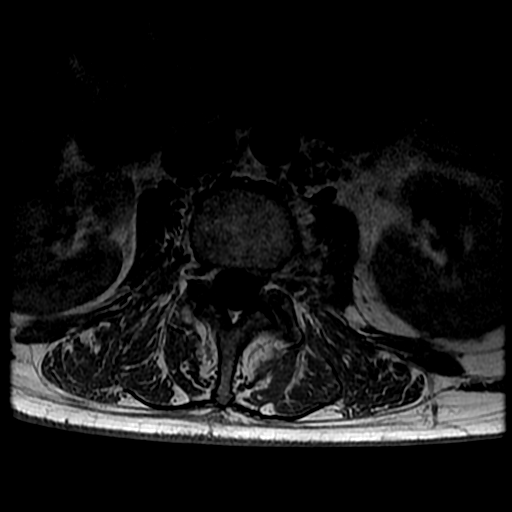
[im 25/38]
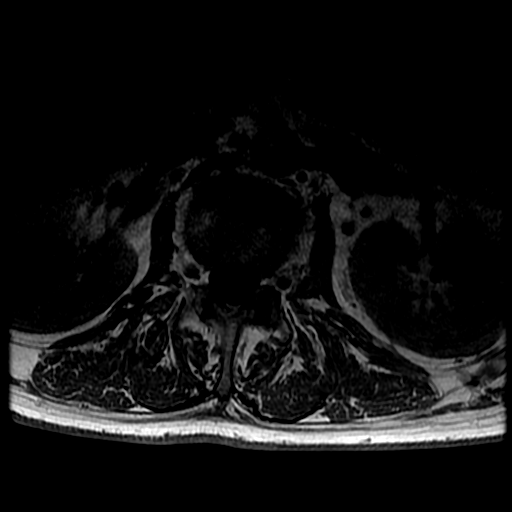
[im 31/38]
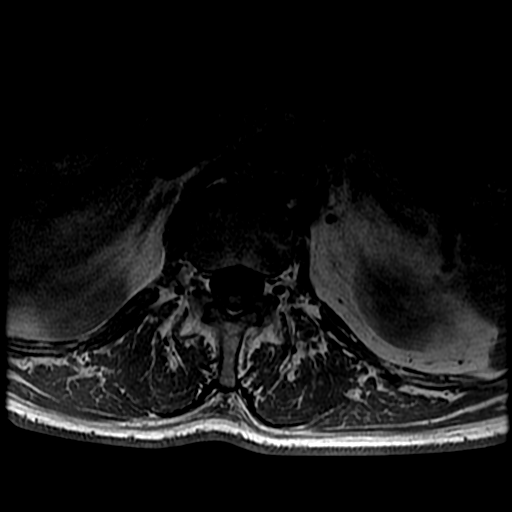

[Series 7: T2 post-contrast · sagittal · 4.0mm · 0.51mm/px · 3 of 15 slices shown]
[im 1/15]
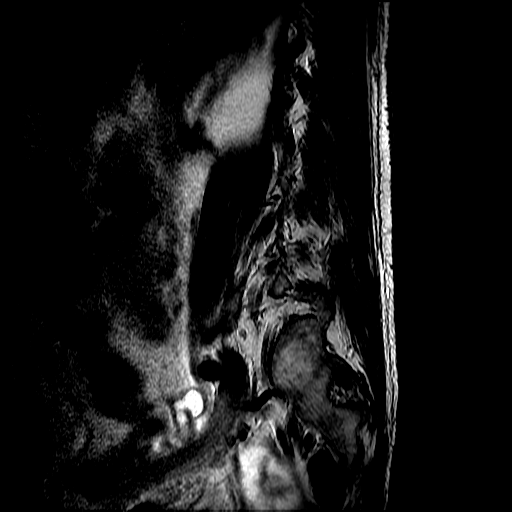
[im 8/15]
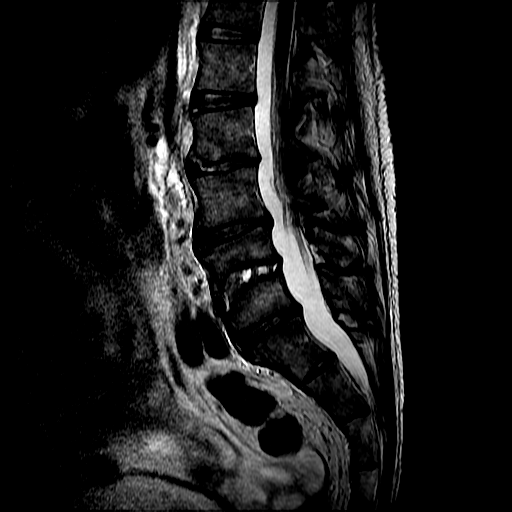
[im 15/15]
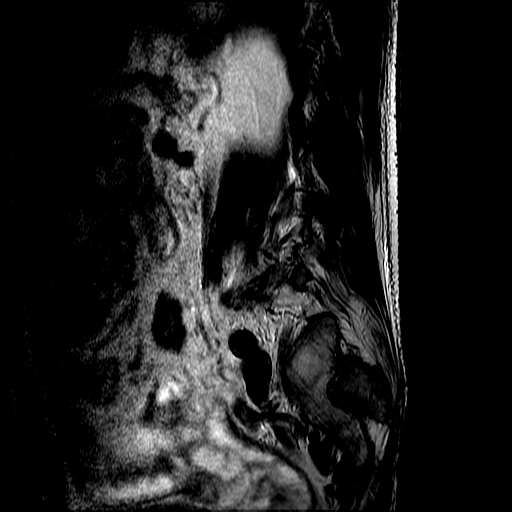

[19 of 48 positions shown; findings below may reference images not displayed]

FINDINGS: Segmentation:  Standard.

Alignment:  Physiologic.

Vertebrae: Persistent abnormal T1-weighted signal at L4 and L5 with
diffuse contrast enhancement. There is peripheral contrast
enhancement surrounding the disc and surrounding the left facet
joint. Mild enhancement also at the right facet joint. Enhancement
within the posterior L2-3 disc space is unchanged.

Conus medullaris and cauda equina: Conus extends to the L1-2 disc
level. Conus and cauda equina appear normal.

Paraspinal and other soft tissues: No discrete paraspinal fluid
collection. No psoas abscess.

Disc levels:

T12-L1: Normal disc space and facets. No spinal canal or
neuroforaminal stenosis.

L1-L2: Normal disc space and facets. No spinal canal or
neuroforaminal stenosis.

L2-L3: Mild disc edema. No herniation or stenosis.

L3-L4: Left asymmetric disc bulge with mild bilateral neural
foraminal stenosis. No spinal canal stenosis.

L4-L5: Disc space narrowing with abnormal contrast enhancement of
the disc space and the left-greater-than-right facet joints, as
above. There is no spinal canal stenosis. Both neural foramina
remains severely narrowed.

L5-S1: Small disc bulge without spinal canal or neural foraminal
stenosis.

Visualized sacrum: Normal.
IMPRESSION: 1. Unchanged appearance of discitis osteomyelitis at L4-L5 with
intradiscal abscess. Abnormal enhancement at the
left-greater-than-right facet joints is slightly decreased. No
epidural abscess.
2. Unchanged mild bilateral L3-L4 neural foraminal stenosis.
3. Unchanged contrast enhancement of the posterior aspect of the
L2-3 disc.

## 2019-05-16 MED ORDER — GADOBUTROL 1 MMOL/ML IV SOLN
6.0000 mL | Freq: Once | INTRAVENOUS | Status: AC | PRN
  Administered 2019-05-16: 23:00:00 6 mL via INTRAVENOUS

## 2019-05-16 MED ORDER — LORAZEPAM 2 MG/ML IJ SOLN
1.0000 mg | Freq: Once | INTRAMUSCULAR | Status: AC | PRN
  Administered 2019-05-16: 1 mg via INTRAMUSCULAR
  Filled 2019-05-16: qty 1

## 2019-05-16 NOTE — ED Triage Notes (Addendum)
Per pt, states patient was discharged for sepsis in his spine due IV drug use-states he has been clean since being discharge-states increased lower back and hip pain since hospitalization-states painful to walk-states he feels like his heart is skipping a beat too

## 2019-05-16 NOTE — ED Notes (Addendum)
Pt transported to MRI 
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
## 2019-05-17 ENCOUNTER — Telehealth: Payer: Self-pay | Admitting: Internal Medicine

## 2019-05-17 ENCOUNTER — Telehealth: Payer: Self-pay

## 2019-05-17 MED ORDER — OXYCODONE HCL 5 MG PO TABS: 5.0000 mg | ORAL_TABLET | Freq: Four times a day (QID) | ORAL | 0 refills | Status: DC | PRN

## 2019-05-17 MED FILL — CEPHALEXIN 500 MG CAPSULE: 500 | 30 days supply | Qty: 120 | Fill #0

## 2019-05-17 NOTE — Telephone Encounter (Signed)
error 

## 2019-05-17 NOTE — Telephone Encounter (Signed)
Patient requesting Dr. Daiva Eves to refill medications previously managed by his PCP. Patient is in the process of switching PCP's and will see new PCP 05/26/19. Patient requesting Metoprolol and Gabapentin. Routing to provider for approval.  Valarie Cones

## 2019-05-17 NOTE — ED Notes (Signed)
Pt was verbalized discharge instructions. Pt had no further questions at this time. NAD. 

## 2019-05-17 NOTE — Telephone Encounter (Signed)
I am OK to fill one month supply but then he needs to find PCP

## 2019-05-17 NOTE — Discharge Instructions (Signed)
You were seen in the emergency department today with worsening back pain.  Your MRI is unchanged.  Continue your antibiotics and follow with your pain management doctors as well as your infectious disease doctors.

## 2019-05-19 MED ORDER — METOPROLOL TARTRATE 100 MG PO TABS: 100.0000 mg | ORAL_TABLET | Freq: Two times a day (BID) | ORAL | 0 refills | Status: DC

## 2019-05-19 MED ORDER — GABAPENTIN 300 MG PO CAPS: 600.0000 mg | ORAL_CAPSULE | Freq: Two times a day (BID) | ORAL | 0 refills | Status: DC

## 2019-05-19 NOTE — Addendum Note (Signed)
Addended by: Valarie Cones on: 05/19/2019 08:56 AM   Modules accepted: Orders

## 2019-05-26 ENCOUNTER — Other Ambulatory Visit: Payer: Self-pay

## 2019-05-26 ENCOUNTER — Ambulatory Visit: Payer: Self-pay | Attending: Family Medicine | Admitting: Family Medicine

## 2019-05-26 ENCOUNTER — Encounter: Payer: Self-pay | Admitting: Family Medicine

## 2019-05-26 VITALS — BP 195/100 | HR 70 | Ht 72.0 in | Wt 151.0 lb

## 2019-05-26 DIAGNOSIS — M545 Low back pain, unspecified: Secondary | ICD-10-CM

## 2019-05-26 DIAGNOSIS — Z09 Encounter for follow-up examination after completed treatment for conditions other than malignant neoplasm: Secondary | ICD-10-CM

## 2019-05-26 DIAGNOSIS — I10 Essential (primary) hypertension: Secondary | ICD-10-CM

## 2019-05-26 DIAGNOSIS — G8929 Other chronic pain: Secondary | ICD-10-CM

## 2019-05-26 DIAGNOSIS — M4626 Osteomyelitis of vertebra, lumbar region: Secondary | ICD-10-CM

## 2019-05-26 MED ORDER — AMLODIPINE BESYLATE 10 MG PO TABS: 10.0000 mg | ORAL_TABLET | Freq: Every day | ORAL | 2 refills | Status: DC

## 2019-05-26 MED ORDER — OXYCODONE HCL 5 MG PO TABS: 5.0000 mg | ORAL_TABLET | Freq: Three times a day (TID) | ORAL | 0 refills | Status: AC | PRN

## 2019-05-26 MED ORDER — IBUPROFEN 600 MG PO TABS: 600.0000 mg | ORAL_TABLET | Freq: Three times a day (TID) | ORAL | 1 refills | Status: DC | PRN
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
## 2019-05-29 ENCOUNTER — Ambulatory Visit: Payer: Self-pay | Attending: Internal Medicine

## 2019-05-29 ENCOUNTER — Other Ambulatory Visit: Payer: Self-pay

## 2019-05-31 LAB — SEDIMENTATION RATE: Sed Rate: 20 mm/h — ABNORMAL HIGH (ref 0–15)

## 2019-06-01 ENCOUNTER — Telehealth: Payer: Self-pay | Admitting: Family Medicine

## 2019-06-01 ENCOUNTER — Telehealth (INDEPENDENT_AMBULATORY_CARE_PROVIDER_SITE_OTHER): Payer: Self-pay

## 2019-06-01 NOTE — Telephone Encounter (Signed)
Patient returned call and verified date of birth. He is aware that sed rate is mildly increased yet decreasing from prior level. Maryjean Morn, CMA

## 2019-06-01 NOTE — Telephone Encounter (Signed)
-----   Message from Cain Saupe, MD sent at 05/31/2019  8:40 AM EST ----- Mild increase in sed rate at 20 but is decreasing from prior level

## 2019-06-01 NOTE — Telephone Encounter (Signed)
Patient returned call regarding lab results. Please f/u  

## 2019-06-02 NOTE — Telephone Encounter (Signed)
Can you find out patient's question regarding his lab results? The sed rate is a marker of inflammation and his is decreasing from the level during his hospitalization. He was hospitalized for osteomyelitis so the inflammation is improving

## 2019-06-06 ENCOUNTER — Telehealth: Payer: Self-pay | Admitting: Family Medicine

## 2019-06-06 NOTE — Telephone Encounter (Signed)
Called patient stated he already spoke to the nurse. No concerns verbalized at this time.

## 2019-06-06 NOTE — Telephone Encounter (Signed)
Patient requested a refill on oxyCODONE (ROXICODONE) 5 MG immediate release tablet.  Patient would like it sent [id number] [place], [place] - [address]  [address] [place], [place] [address].

## 2019-06-06 NOTE — Telephone Encounter (Signed)
Please asked patient to keep his follow-up appointment on February 12.  Patient was aware that this was a short-term medication refill.  He can take the ibuprofen that he was previously prescribed but also ask if he would like a prescription for either Tylenol 3 or tramadol for more severe pain and this can be sent to the [facility] or the pharmacy of patient's choice.

## 2019-06-09 ENCOUNTER — Other Ambulatory Visit: Payer: Self-pay

## 2019-06-09 ENCOUNTER — Encounter: Payer: Self-pay | Admitting: Family Medicine

## 2019-06-09 ENCOUNTER — Ambulatory Visit: Payer: Self-pay | Attending: Family Medicine | Admitting: Family Medicine

## 2019-06-09 VITALS — BP 136/88 | HR 94 | Temp 97.8°F | Ht 72.0 in | Wt 148.0 lb

## 2019-06-09 DIAGNOSIS — M5441 Lumbago with sciatica, right side: Secondary | ICD-10-CM

## 2019-06-09 DIAGNOSIS — M5442 Lumbago with sciatica, left side: Secondary | ICD-10-CM

## 2019-06-09 DIAGNOSIS — Z789 Other specified health status: Secondary | ICD-10-CM

## 2019-06-09 DIAGNOSIS — I10 Essential (primary) hypertension: Secondary | ICD-10-CM

## 2019-06-09 DIAGNOSIS — M62830 Muscle spasm of back: Secondary | ICD-10-CM

## 2019-06-09 DIAGNOSIS — G8929 Other chronic pain: Secondary | ICD-10-CM

## 2019-06-09 MED ORDER — OXYCODONE HCL 5 MG PO TABS: 5.0000 mg | ORAL_TABLET | Freq: Three times a day (TID) | ORAL | 0 refills | Status: AC | PRN

## 2019-06-09 MED ORDER — TIZANIDINE HCL 4 MG PO TABS: ORAL_TABLET | ORAL | 2 refills | Status: DC
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
## 2019-06-26 ENCOUNTER — Telehealth: Payer: Self-pay | Admitting: Family Medicine

## 2019-06-26 MED ORDER — METOPROLOL TARTRATE 100 MG PO TABS: 100.0000 mg | ORAL_TABLET | Freq: Two times a day (BID) | ORAL | 0 refills | Status: DC

## 2019-06-26 NOTE — Telephone Encounter (Signed)
Rx sent 

## 2019-06-26 NOTE — Telephone Encounter (Signed)
1) Medication(s) Requested (by name): metoprolol tartrate (LOPRESSOR) 100 MG    2) [facility] [address]   3) Special Requests:   Approved medications will be sent to the pharmacy, we will reach out if there is an issue.  Requests made after 3pm may not be addressed until the following business day!  If a patient is unsure of the name of the medication(s) please note and ask patient to call back when they are able to provide all info, do not send to responsible party until all information is available!

## 2019-06-28 ENCOUNTER — Telehealth: Payer: Self-pay | Admitting: Family Medicine

## 2019-06-28 ENCOUNTER — Ambulatory Visit (INDEPENDENT_AMBULATORY_CARE_PROVIDER_SITE_OTHER): Payer: Self-pay | Admitting: Infectious Disease

## 2019-06-28 ENCOUNTER — Other Ambulatory Visit: Payer: Self-pay | Admitting: Family Medicine

## 2019-06-28 ENCOUNTER — Encounter: Payer: Self-pay | Admitting: Infectious Disease

## 2019-06-28 ENCOUNTER — Other Ambulatory Visit: Payer: Self-pay

## 2019-06-28 VITALS — BP 143/94 | HR 85 | Temp 97.9°F | Wt 146.0 lb

## 2019-06-28 DIAGNOSIS — M4626 Osteomyelitis of vertebra, lumbar region: Secondary | ICD-10-CM

## 2019-06-28 DIAGNOSIS — Z23 Encounter for immunization: Secondary | ICD-10-CM

## 2019-06-28 DIAGNOSIS — F1120 Opioid dependence, uncomplicated: Secondary | ICD-10-CM

## 2019-06-28 DIAGNOSIS — M4624 Osteomyelitis of vertebra, thoracic region: Secondary | ICD-10-CM

## 2019-06-28 DIAGNOSIS — J869 Pyothorax without fistula: Secondary | ICD-10-CM

## 2019-06-28 DIAGNOSIS — F1190 Opioid use, unspecified, uncomplicated: Secondary | ICD-10-CM

## 2019-06-28 DIAGNOSIS — G8929 Other chronic pain: Secondary | ICD-10-CM

## 2019-06-28 DIAGNOSIS — F1910 Other psychoactive substance abuse, uncomplicated: Secondary | ICD-10-CM

## 2019-06-28 MED ORDER — OXYCODONE HCL 5 MG PO TABS: ORAL_TABLET | ORAL | 0 refills | Status: DC

## 2019-06-28 MED ORDER — CEPHALEXIN 500 MG PO CAPS: 500.0000 mg | ORAL_CAPSULE | Freq: Four times a day (QID) | ORAL | 11 refills | Status: AC

## 2019-06-28 MED FILL — CEPHALEXIN 500 MG CAPSULE: 500 | 30 days supply | Qty: 120 | Fill #0
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
## 2019-06-28 NOTE — Telephone Encounter (Signed)
Please notify patient that a new prescription was sent to his pharmacy for oxycodone 5 mg to take half pill twice daily as needed for severe pain #30, 30-day supply with no refill.  New referral was placed to pain management as it appears on review of chart that his prior visit referral was closed

## 2019-06-28 NOTE — Progress Notes (Signed)
Patient ID: Franklin Anderson, male   DOB: 08-09-1970, 49 y.o.   MRN: [id number]   Message received the patient is requesting a refill of oxycodone for continued treatment of low back pain status post lumbar osteomyelitis and discitis.  Patient's recent infectious disease note reviewed.  Patient was previously referred to pain management however on review of chart, it appears that this referral is closed.  Will place new referral for pain management for patient and will send in new short-term prescription for oxycodone to take as needed for severe pain.

## 2019-06-28 NOTE — Telephone Encounter (Signed)
Patient called and requested for listed medication to be refilled and [facility] [address]   Oxycodone 5 mg

## 2019-06-28 NOTE — Patient Instructions (Signed)
COVID-19 Vaccine Information can be found at: [email or web address] For questions related to vaccine distribution or appointments, please email [email or web address] or call [phone].    

## 2019-06-30 NOTE — Telephone Encounter (Signed)
Called patient at [phone]/ left VM / Name and contact information provided

## 2019-07-18 ENCOUNTER — Other Ambulatory Visit: Payer: Self-pay | Admitting: Family Medicine

## 2019-07-26 ENCOUNTER — Telehealth: Payer: Self-pay | Admitting: Licensed Clinical Social Worker

## 2019-07-26 ENCOUNTER — Other Ambulatory Visit: Payer: Self-pay | Admitting: Family Medicine

## 2019-07-26 DIAGNOSIS — G8929 Other chronic pain: Secondary | ICD-10-CM

## 2019-07-26 DIAGNOSIS — M545 Low back pain, unspecified: Secondary | ICD-10-CM

## 2019-07-26 DIAGNOSIS — M4626 Osteomyelitis of vertebra, lumbar region: Secondary | ICD-10-CM
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
## 2019-07-31 ENCOUNTER — Telehealth: Payer: Self-pay

## 2019-07-31 NOTE — Telephone Encounter (Signed)
Patient called and requested a refill on oxycodone, patient was referred to pain management and due to no insurance or employment he is not able to be seen. Patient is requesting some form of pain management.

## 2019-08-01 ENCOUNTER — Other Ambulatory Visit: Payer: Self-pay | Admitting: Family Medicine

## 2019-08-01 DIAGNOSIS — G8929 Other chronic pain: Secondary | ICD-10-CM

## 2019-08-01 DIAGNOSIS — M5442 Lumbago with sciatica, left side: Secondary | ICD-10-CM

## 2019-08-01 MED ORDER — TRAMADOL HCL 50 MG PO TABS: 50.0000 mg | ORAL_TABLET | Freq: Two times a day (BID) | ORAL | 0 refills | Status: DC | PRN

## 2019-08-01 NOTE — Telephone Encounter (Signed)
[facility] that patient can take as needed for pain.  Please asked patient if he would like a referral to physical therapy.

## 2019-08-01 NOTE — Progress Notes (Signed)
Patient ID: Franklin Anderson, male   DOB: [birth year], 49 y.o.   MRN: [id number]   Patient left message requesting pain medication as he is uninsured and cannot get an appointment with pain management.  Prescription will be sent in for tramadol and patient will need follow-up appointment.

## 2019-08-01 NOTE — Telephone Encounter (Signed)
Patient was called and informed of pain medication being sent over to pharmacy.

## 2019-08-07 ENCOUNTER — Telehealth: Payer: Self-pay | Admitting: Licensed Clinical Social Worker

## 2019-08-07 ENCOUNTER — Ambulatory Visit: Payer: Self-pay | Attending: Family Medicine | Admitting: Licensed Clinical Social Worker

## 2019-08-07 ENCOUNTER — Other Ambulatory Visit: Payer: Self-pay

## 2019-08-07 DIAGNOSIS — Z789 Other specified health status: Secondary | ICD-10-CM

## 2019-08-07 NOTE — Telephone Encounter (Signed)
Call placed to patient regarding scheduled [facility] appointment. Pt's voicemail is not set-up; therefore, pt was unable to leave message to request return call.

## 2019-08-14 ENCOUNTER — Telehealth: Payer: Self-pay | Admitting: Family Medicine

## 2019-08-14 NOTE — Telephone Encounter (Signed)
Patient called in to inform pcp that he now has medicaid. Patient requested for the referral to be placed for the pain management clinic. Please follow up at your earliest convenience.

## 2019-08-14 NOTE — Telephone Encounter (Signed)
Spoke with patient and informed him that this Medicaid is not active and he may need to call Medicaid and ask them if he is still covered. Patient agreed and stated he will call Medicaid. Patient aware to call office back and update staff when he does get approved again and patient agreed.

## 2019-08-18 NOTE — BH Specialist Note (Signed)
Pt did not show to appt. Check-in was not canceled prior to end of day processing.

## 2019-08-22 ENCOUNTER — Other Ambulatory Visit: Payer: Self-pay | Admitting: Family Medicine

## 2019-08-22 DIAGNOSIS — I10 Essential (primary) hypertension: Secondary | ICD-10-CM

## 2019-09-06 ENCOUNTER — Other Ambulatory Visit: Payer: Self-pay

## 2019-09-06 ENCOUNTER — Ambulatory Visit: Payer: Self-pay | Attending: Family Medicine | Admitting: Family Medicine

## 2019-09-06 ENCOUNTER — Encounter: Payer: Self-pay | Admitting: Family Medicine

## 2019-09-06 VITALS — BP 174/97 | HR 62 | Temp 97.7°F | Ht 72.0 in | Wt 147.5 lb

## 2019-09-06 DIAGNOSIS — I10 Essential (primary) hypertension: Secondary | ICD-10-CM

## 2019-09-06 DIAGNOSIS — M792 Neuralgia and neuritis, unspecified: Secondary | ICD-10-CM

## 2019-09-06 DIAGNOSIS — G8929 Other chronic pain: Secondary | ICD-10-CM

## 2019-09-06 DIAGNOSIS — M5442 Lumbago with sciatica, left side: Secondary | ICD-10-CM

## 2019-09-06 DIAGNOSIS — M5441 Lumbago with sciatica, right side: Secondary | ICD-10-CM

## 2019-09-06 DIAGNOSIS — Z720 Tobacco use: Secondary | ICD-10-CM

## 2019-09-06 MED ORDER — DULOXETINE HCL 30 MG PO CPEP: 30.0000 mg | ORAL_CAPSULE | Freq: Two times a day (BID) | ORAL | 3 refills | Status: DC

## 2019-09-06 MED ORDER — AMLODIPINE BESYLATE 10 MG PO TABS: ORAL_TABLET | ORAL | 2 refills | Status: DC

## 2019-09-06 MED ORDER — ACETAMINOPHEN-CODEINE #3 300-30 MG PO TABS: 1.0000 | ORAL_TABLET | Freq: Four times a day (QID) | ORAL | 0 refills | Status: AC | PRN

## 2019-09-06 MED ORDER — METOPROLOL TARTRATE 100 MG PO TABS: 100.0000 mg | ORAL_TABLET | Freq: Two times a day (BID) | ORAL | 4 refills | Status: DC

## 2019-09-06 MED ORDER — GABAPENTIN 300 MG PO CAPS: 600.0000 mg | ORAL_CAPSULE | Freq: Three times a day (TID) | ORAL | 3 refills | Status: DC

## 2019-09-06 MED ORDER — ALBUTEROL SULFATE HFA 108 (90 BASE) MCG/ACT IN AERS: 2.0000 | INHALATION_SPRAY | Freq: Four times a day (QID) | RESPIRATORY_TRACT | 2 refills | Status: DC | PRN
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
## 2019-09-06 NOTE — Progress Notes (Signed)
Back pain 6-7    Med refills for Amlodipine,Klonopin,Gabapentin, Oxycodone, Zanaflex,   36.5 147.5

## 2019-09-11 ENCOUNTER — Ambulatory Visit: Payer: Self-pay | Admitting: Infectious Disease

## 2019-09-22 ENCOUNTER — Other Ambulatory Visit: Payer: Self-pay | Admitting: Family Medicine

## 2019-09-22 DIAGNOSIS — M792 Neuralgia and neuritis, unspecified: Secondary | ICD-10-CM

## 2019-09-22 DIAGNOSIS — G8929 Other chronic pain: Secondary | ICD-10-CM

## 2019-09-27 ENCOUNTER — Ambulatory Visit: Payer: Self-pay | Admitting: Infectious Disease

## 2019-10-02 ENCOUNTER — Ambulatory Visit: Payer: Self-pay | Admitting: Infectious Disease

## 2019-10-03 ENCOUNTER — Telehealth: Payer: Self-pay | Admitting: Family Medicine

## 2019-10-03 NOTE — Telephone Encounter (Signed)
1) Medication(s) Requested (by name): Tylenol # 3  2) Pharmacy of Choice: [facility] pharmacy  3) Special Requests:   Approved medications will be sent to the pharmacy, we will reach out if there is an issue.  Requests made after 3pm may not be addressed until the following business day!  If a patient is unsure of the name of the medication(s) please note and ask patient to call back when they are able to provide all info, do not send to responsible party until all information is available!

## 2019-10-04 NOTE — Telephone Encounter (Signed)
Due to his history he would need to obtain this during his upcoming office visit.  I am uncomfortable prescribing this medication for him.

## 2019-10-05 ENCOUNTER — Ambulatory Visit: Payer: Self-pay | Admitting: Family Medicine

## 2019-10-18 ENCOUNTER — Ambulatory Visit: Payer: Self-pay | Attending: Family Medicine | Admitting: Physician Assistant

## 2019-10-18 ENCOUNTER — Other Ambulatory Visit: Payer: Self-pay

## 2019-10-18 DIAGNOSIS — G8929 Other chronic pain: Secondary | ICD-10-CM

## 2019-10-18 DIAGNOSIS — I10 Essential (primary) hypertension: Secondary | ICD-10-CM

## 2019-10-18 DIAGNOSIS — Z720 Tobacco use: Secondary | ICD-10-CM

## 2019-10-18 DIAGNOSIS — M792 Neuralgia and neuritis, unspecified: Secondary | ICD-10-CM

## 2019-10-18 DIAGNOSIS — M5442 Lumbago with sciatica, left side: Secondary | ICD-10-CM

## 2019-10-18 DIAGNOSIS — M5441 Lumbago with sciatica, right side: Secondary | ICD-10-CM

## 2019-10-18 MED ORDER — METOPROLOL TARTRATE 100 MG PO TABS: 100.0000 mg | ORAL_TABLET | Freq: Two times a day (BID) | ORAL | 4 refills | Status: DC

## 2019-10-18 MED ORDER — GABAPENTIN 300 MG PO CAPS: 600.0000 mg | ORAL_CAPSULE | Freq: Three times a day (TID) | ORAL | 3 refills | Status: DC

## 2019-10-18 MED ORDER — AMLODIPINE BESYLATE 10 MG PO TABS: ORAL_TABLET | ORAL | 2 refills | Status: DC

## 2019-10-18 MED ORDER — ACETAMINOPHEN-CODEINE #3 300-30 MG PO TABS: 1.0000 | ORAL_TABLET | ORAL | 0 refills | Status: DC | PRN

## 2019-10-18 MED ORDER — ALBUTEROL SULFATE HFA 108 (90 BASE) MCG/ACT IN AERS: 2.0000 | INHALATION_SPRAY | Freq: Four times a day (QID) | RESPIRATORY_TRACT | 2 refills | Status: DC | PRN

## 2019-10-18 MED FILL — METOPROLOL TARTRATE 100 MG: 100 | 30 days supply | Qty: 60 | Fill #0

## 2019-10-18 MED FILL — ALBUTEROL SULFATE HFA 108 (: 108 (90 BAS | 25 days supply | Qty: 9 | Fill #0

## 2019-10-18 MED FILL — ACETAMINOPHEN/COD #3 TABLET: 300-30 | 2 days supply | Qty: 15 | Fill #0

## 2019-10-18 MED FILL — GABAPENTIN 300 MG CAPSULE: 300 | 30 days supply | Qty: 180 | Fill #0

## 2019-10-18 MED FILL — AMLODIPINE BESYLATE 10 MG T: 10 | 30 days supply | Qty: 30 | Fill #0

## 2019-10-18 NOTE — Progress Notes (Signed)
Virtual Visit via Telephone Note  I connected with Franklin Anderson on 10/18/19 at  3:50 PM EDT by telephone and verified that I am speaking with the correct person using two identifiers.   I discussed the limitations, risks, security and privacy concerns of performing an evaluation and management service by telephone and the availability of in person appointments. I also discussed with the patient that there may be a patient responsible charge related to this service. The patient expressed understanding and agreed to proceed.  PATIENT visit by telephone virtually in the context of Covid-19 pandemic. Patient location:  Home  My Location:  [facility] office Persons on the call:  Me and the patient   History of Present Illness:  Patient for recheck of BP.  Says Bp has been normal OOO since resuming his BP meds except for "when my pain flares up."  He has not filled out the financial packet but wants me to go ahead and do pain management referral.  He says gabapentin helps but he is not taking the Cymbalta that Dr fulp prescribed.  He says he "read stuff about it" and isn't going to take it.  Requesting tylenol #3    Observations/Objective:  NAD.  A&Ox3   Assessment and Plan: 1. Essential hypertension Controlled based on OOO readings.  continue - amLODipine (NORVASC) 10 MG tablet; TAKE 1 TABLET BY MOUTH DAILY TO LOWER BLOOD PRESSURE  Dispense: 30 tablet; Refill: 2 - metoprolol tartrate (LOPRESSOR) 100 MG tablet; Take 1 tablet (100 mg total) by mouth 2 (two) times daily.  Dispense: 60 tablet; Refill: 4  2. Neuropathic pain - Ambulatory referral to Pain Clinic - gabapentin (NEURONTIN) 300 MG capsule; Take 2 capsules (600 mg total) by mouth 3 (three) times daily.  Dispense: 180 capsule; Refill: 3  3. Chronic midline low back pain with bilateral sciatica I have counseled him on our narcotics policy.  He says he will come to the office today to get a financial packet.   - Ambulatory referral to Pain  Clinic - acetaminophen-codeine (TYLENOL #3) 300-30 MG tablet; Take 1 tablet by mouth every 4 (four) hours as needed for moderate pain.  Dispense: 15 tablet; Refill: 0 - gabapentin (NEURONTIN) 300 MG capsule; Take 2 capsules (600 mg total) by mouth 3 (three) times daily.  Dispense: 180 capsule; Refill: 3  4. Tobacco use Smoking cessation discussed.   - albuterol (VENTOLIN HFA) 108 (90 Base) MCG/ACT inhaler; Inhale 2 puffs into the lungs every 6 (six) hours as needed for wheezing or shortness of breath.  Dispense: 18 g; Refill: 2  Follow Up Instructions: 2-3 months with PCP   I discussed the assessment and treatment plan with the patient. The patient was provided an opportunity to ask questions and all were answered. The patient agreed with the plan and demonstrated an understanding of the instructions.   The patient was advised to call back or seek an in-person evaluation if the symptoms worsen or if the condition fails to improve as anticipated.  I provided 14 minutes of non-face-to-face time during this encounter.   Georgian Co, PA-C  Patient ID: Franklin Anderson, male   DOB: June 05, 1970, 49 y.o.   MRN: [id number]

## 2019-11-03 MED FILL — ALBUTEROL SULFATE HFA 108 (: 108 (90 BAS | 25 days supply | Qty: 9 | Fill #0

## 2019-12-20 ENCOUNTER — Other Ambulatory Visit: Payer: Self-pay | Admitting: Family Medicine

## 2019-12-20 DIAGNOSIS — I10 Essential (primary) hypertension: Secondary | ICD-10-CM

## 2020-04-01 ENCOUNTER — Other Ambulatory Visit: Payer: Self-pay | Admitting: Family Medicine

## 2020-04-01 DIAGNOSIS — I10 Essential (primary) hypertension: Secondary | ICD-10-CM

## 2020-11-23 ENCOUNTER — Other Ambulatory Visit: Payer: Self-pay | Admitting: Physician Assistant

## 2020-11-23 DIAGNOSIS — I10 Essential (primary) hypertension: Secondary | ICD-10-CM

## 2021-03-02 ENCOUNTER — Other Ambulatory Visit: Payer: Self-pay

## 2021-03-02 ENCOUNTER — Emergency Department (HOSPITAL_BASED_OUTPATIENT_CLINIC_OR_DEPARTMENT_OTHER)
Admission: EM | Admit: 2021-03-02 | Discharge: 2021-03-02 | Disposition: A | Discharge status: Home / Self Care | Payer: Self-pay | Attending: Emergency Medicine | Admitting: Emergency Medicine

## 2021-03-02 ENCOUNTER — Emergency Department (HOSPITAL_BASED_OUTPATIENT_CLINIC_OR_DEPARTMENT_OTHER): Payer: Self-pay | Admitting: Radiology

## 2021-03-02 ENCOUNTER — Encounter (HOSPITAL_BASED_OUTPATIENT_CLINIC_OR_DEPARTMENT_OTHER): Payer: Self-pay | Admitting: Obstetrics and Gynecology

## 2021-03-02 DIAGNOSIS — F17210 Nicotine dependence, cigarettes, uncomplicated: Secondary | ICD-10-CM | POA: Insufficient documentation

## 2021-03-02 DIAGNOSIS — M25531 Pain in right wrist: Secondary | ICD-10-CM | POA: Insufficient documentation

## 2021-03-02 DIAGNOSIS — W010XXA Fall on same level from slipping, tripping and stumbling without subsequent striking against object, initial encounter: Secondary | ICD-10-CM | POA: Insufficient documentation

## 2021-03-02 DIAGNOSIS — I10 Essential (primary) hypertension: Secondary | ICD-10-CM | POA: Insufficient documentation

## 2021-03-02 DIAGNOSIS — S63501A Unspecified sprain of right wrist, initial encounter: Secondary | ICD-10-CM | POA: Insufficient documentation

## 2021-03-02 DIAGNOSIS — Z79899 Other long term (current) drug therapy: Secondary | ICD-10-CM | POA: Insufficient documentation

## 2021-03-02 MED ORDER — DICLOFENAC SODIUM 50 MG PO TBEC: 50.0000 mg | DELAYED_RELEASE_TABLET | Freq: Two times a day (BID) | ORAL | 0 refills | Status: AC

## 2021-03-02 NOTE — ED Notes (Signed)
Pt d/c home per MD order. Discharge summary reviewed with pt, pt verbalizes understanding. No s/s of acute distress noted at discharge. Ambulatory off unit. D/C home with visitor.

## 2021-03-02 NOTE — Discharge Instructions (Addendum)
Take diclofenac daily with food.  Discontinue use if you develop any abdominal pain. Wear wrist brace except to bathe.  Follow-up with Ortho if not improving in 1 week.
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
## 2021-03-02 NOTE — ED Triage Notes (Signed)
Patient reports to the ER for a fall that caused pain to the right wrist. Patient got hurt on Wednesday

## 2021-12-23 ENCOUNTER — Other Ambulatory Visit: Payer: Self-pay

## 2021-12-23 ENCOUNTER — Ambulatory Visit: Payer: Self-pay | Admitting: Physician Assistant

## 2021-12-23 ENCOUNTER — Encounter: Payer: Self-pay | Admitting: Physician Assistant

## 2021-12-23 ENCOUNTER — Ambulatory Visit: Payer: Self-pay

## 2021-12-23 VITALS — BP 182/119 | HR 98 | Resp 18 | Ht 72.0 in | Wt 130.0 lb

## 2021-12-23 DIAGNOSIS — G8929 Other chronic pain: Secondary | ICD-10-CM

## 2021-12-23 DIAGNOSIS — R636 Underweight: Secondary | ICD-10-CM

## 2021-12-23 DIAGNOSIS — M5441 Lumbago with sciatica, right side: Secondary | ICD-10-CM

## 2021-12-23 DIAGNOSIS — M5442 Lumbago with sciatica, left side: Secondary | ICD-10-CM

## 2021-12-23 DIAGNOSIS — I10 Essential (primary) hypertension: Secondary | ICD-10-CM

## 2021-12-23 DIAGNOSIS — F4310 Post-traumatic stress disorder, unspecified: Secondary | ICD-10-CM

## 2021-12-23 DIAGNOSIS — F411 Generalized anxiety disorder: Secondary | ICD-10-CM

## 2021-12-23 DIAGNOSIS — I160 Hypertensive urgency: Secondary | ICD-10-CM

## 2021-12-23 MED ORDER — CLONIDINE HCL 0.1 MG PO TABS
0.1000 mg | ORAL_TABLET | Freq: Once | ORAL | Status: AC
  Administered 2021-12-23: 0.1 mg via ORAL

## 2021-12-23 MED ORDER — METHYLPREDNISOLONE SODIUM SUCC 125 MG IJ SOLR: 125.0000 mg | Freq: Once | INTRAMUSCULAR | Status: DC

## 2021-12-23 MED ORDER — METOPROLOL TARTRATE 50 MG PO TABS
50.0000 mg | ORAL_TABLET | Freq: Two times a day (BID) | ORAL | 1 refills | Status: DC
  Filled 2021-12-23: qty 60, 30d supply, fill #0
  Filled 2022-01-28: qty 60, 30d supply, fill #1

## 2021-12-23 MED ORDER — KETOROLAC TROMETHAMINE 60 MG/2ML IM SOLN
60.0000 mg | Freq: Once | INTRAMUSCULAR | Status: AC
  Administered 2021-12-23: 60 mg via INTRAMUSCULAR

## 2021-12-23 MED ORDER — MELOXICAM 15 MG PO TABS
15.0000 mg | ORAL_TABLET | Freq: Every day | ORAL | 0 refills | Status: DC
  Filled 2021-12-23: qty 30, 30d supply, fill #0

## 2021-12-23 MED ORDER — HYDROXYZINE HCL 25 MG PO TABS
25.0000 mg | ORAL_TABLET | Freq: Three times a day (TID) | ORAL | 1 refills | Status: DC | PRN
  Filled 2021-12-23: qty 90, 30d supply, fill #0

## 2021-12-23 NOTE — Progress Notes (Unsigned)
   Established Patient Office Visit  Subjective   Patient ID: Franklin Anderson, male    DOB: 03/01/1971  Age: 51 y.o. MRN: [id number]  No chief complaint on file.   Back pain Anxiety Htn  170/120  Ptsd - wakes up ready to fight Not much sleep  L4 and l5 are ruptered   IMPRESSION: 1. 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 Abnormal enhancement at the left-greater-than-right facet joints is slightly decreased. No epidural abscess. 2. Unchanged mild bilateral L3-L4 neural foraminal stenosis. 3. Unchanged contrast enhancement of the posterior aspect of the L2-3 disc.     Electronically Signed   By: Deatra Robinson M.D.   On: 05/16/2019 23:02  No substance in 13 years   Lots of ibuprofen without relief  Muscle relaxer  - can only use Soma     {History (Optional):23778}  ROS    Objective:     There were no vitals taken for this visit. {Vitals History (Optional):23777}  Physical Exam   No results found for any visits on 12/23/21.  {Labs (Optional):23779}  The ASCVD Risk score (Arnett DK, et al., 2019) failed to calculate for the following reasons:   Cannot find a previous HDL lab   Cannot find a previous total cholesterol lab    Assessment & Plan:   Problem List Items Addressed This Visit   None   No follow-ups on file.    Kasandra Knudsen Mayers, PA-C

## 2021-12-23 NOTE — Telephone Encounter (Signed)
  Chief Complaint: anxiety Symptoms: anxious, PTSD, shaking, crying at times, difficulty sleeping  Frequency: ongoing for years Pertinent Negatives: NA Disposition: [] ED /[] Urgent Care (no appt availability in office) / [] Appointment(In office/virtual)/ []  [facility]/ [] Home Care/ [] Refused Recommended Disposition /[x] [facility]/ []  Follow-up with PCP Additional Notes: spoke with pt's wife. Pt hasnt received care in years and has no insurance and not on current medicines. Pt has been experiencing high anxiety and severe back pain for several years and needs to be seen. Pt was already scheduled for appt by agent and told about mobile unit and location. Wife was very rude and acting like she didn't need to be told anything by nurse since agent had already explained. Wife disconnected call.   Reason for Disposition  MODERATE anxiety (e.g., persistent or frequent anxiety symptoms; interferes with sleep, school, or work)  Answer Assessment - Initial Assessment Questions 1. CONCERN: "Did anything happen that prompted you to call today?"      PTSD and anxiety  2. ANXIETY SYMPTOMS: "Can you describe how you (your loved one; patient) have been feeling?" (e.g., tense, restless, panicky, anxious, keyed up, overwhelmed, sense of impending doom).      Anxious, shaking  3. ONSET: "How long have you been feeling this way?" (e.g., hours, days, weeks)     Years  4. SEVERITY: "How would you rate the level of anxiety?" (e.g., 0 - 10; or mild, moderate, severe).     Mild to moderate  5. FUNCTIONAL IMPAIRMENT: "How have these feelings affected your ability to do daily activities?" "Have you had more difficulty than usual doing your normal daily activities?" (e.g., getting better, same, worse; self-care, school, work, interactions)     Unable to go out in public  7. RISK OF HARM - SUICIDAL IDEATION: "Do you ever have thoughts of hurting or killing yourself?" If Yes, ask:  "Do you have these  feelings now?" "Do you have a plan on how you would do this?"     no 8. TREATMENT:  "What has been done so far to treat this anxiety?" (e.g., medicines, relaxation strategies). "What has helped?"     Relaxation  11. PATIENT SUPPORT: "Who is with you now?" "Who do you live with?" "Do you have family or friends who you can talk to?"        Wife  12. OTHER SYMPTOMS: "Do you have any other symptoms?" (e.g., feeling depressed, trouble concentrating, trouble sleeping, trouble breathing, palpitations or fast heartbeat, chest pain, sweating, nausea, or diarrhea)       Difficulty sleeping  Protocols used: Anxiety and Panic Attack-A-AH

## 2021-12-23 NOTE — Patient Instructions (Signed)
To help with your blood pressure, you are going to restart metoprolol, you will start at 50 mg twice a day.  I do encourage you to check your blood pressure at home, keep a written log and have available for all office visits.  You will review these readings with Marylene Land in 2 weeks and can decide at that time if any changes need to be made.  To help with your anxiety and difficulty sleeping you are going to start taking hydroxyzine, you can use 1/2 tablet up to 2 tablets every 6 hours as needed.  To help with your back pain, you are going to start taking Mobic 15 mg once daily.  I have started a referral for you to be seen by orthopedics for further evaluation.  We will call you with today's lab results.  Roney Jaffe, PA-C Physician Assistant [facility] [email or web address]   How to Take Your Blood Pressure Blood pressure is a measurement of how strongly your blood is pressing against the walls of your arteries. Arteries are blood vessels that carry blood from your heart throughout your body. Your health care provider takes your blood pressure at each office visit. You can also take your own blood pressure at home with a blood pressure monitor. You may need to take your own blood pressure to: Confirm a diagnosis of high blood pressure (hypertension). Monitor your blood pressure over time. Make sure your blood pressure medicine is working. Supplies needed: Blood pressure monitor. A chair to sit in. This should be a chair where you can sit upright with your back supported. Do not sit on a soft couch or an armchair. Table or desk. Small notebook and pencil or pen. How to prepare To get the most accurate reading, avoid the following for 30 minutes before you check your blood pressure: Drinking caffeine. Drinking alcohol. Eating. Smoking. Exercising. Five minutes before you check your blood pressure: Use the bathroom and urinate so  that you have an empty bladder. Sit quietly in a chair. Do not talk. How to take your blood pressure To check your blood pressure, follow the instructions in the manual that came with your blood pressure monitor. If you have a digital blood pressure monitor, the instructions may be as follows: Sit up straight in a chair. Place your feet on the floor. Do not cross your ankles or legs. Rest your left arm at the level of your heart on a table or desk or on the arm of a chair. Pull up your shirt sleeve. Wrap the blood pressure cuff around the upper part of your left arm, 1 inch (2.5 cm) above your elbow. It is best to wrap the cuff around bare skin. Fit the cuff snugly, but not too tightly, around your arm. You should be able to place only one finger between the cuff and your arm. Position the cord so that it rests in the bend of your elbow. Press the power button. Sit quietly while the cuff inflates and deflates. Read the digital reading on the monitor screen and write the numbers down (record them) in a notebook. Wait 2-3 minutes, then repeat the steps, starting at step 1. What does my blood pressure reading mean? A blood pressure reading consists of a higher number over a lower number. Ideally, your blood pressure should be below 120/80. The first ("top") number is called the systolic pressure. It is a measure of the pressure in your arteries as your heart beats. The second ("bottom") number  is called the diastolic pressure. It is a measure of the pressure in your arteries as the heart relaxes. Blood pressure is classified into four stages. The following are the stages for adults who do not have a short-term serious illness or a chronic condition. Systolic pressure and diastolic pressure are measured in a unit called mm Hg (millimeters of mercury).  Normal Systolic pressure: below 120. Diastolic pressure: below 80. Elevated Systolic pressure: 120-129. Diastolic pressure: below 80. Hypertension  stage 1 Systolic pressure: 130-139. Diastolic pressure: 80-89. Hypertension stage 2 Systolic pressure: 140 or above. Diastolic pressure: 90 or above. You can have elevated blood pressure or hypertension even if only the systolic or only the diastolic number in your reading is higher than normal. Follow these instructions at home: Medicines Take over-the-counter and prescription medicines only as told by your health care provider. Tell your health care provider if you are having any side effects from blood pressure medicine. General instructions Check your blood pressure as often as recommended by your health care provider. Check your blood pressure at the same time every day. Take your monitor to the next appointment with your health care provider to make sure that: You are using it correctly. It provides accurate readings. Understand what your goal blood pressure numbers are. Keep all follow-up visits. This is important. General tips Your health care provider can suggest a reliable monitor that will meet your needs. There are several types of home blood pressure monitors. Choose a monitor that has an arm cuff. Do not choose a monitor that measures your blood pressure from your wrist or finger. Choose a cuff that wraps snugly, not too tight or too loose, around your upper arm. You should be able to fit only one finger between your arm and the cuff. You can buy a blood pressure monitor at most drugstores or online. Where to find more information American Heart Association: www.heart.org Contact a health care provider if: Your blood pressure is consistently high. Your blood pressure is suddenly low. Get help right away if: Your systolic blood pressure is higher than 180. Your diastolic blood pressure is higher than 120. These symptoms may be an emergency. Get help right away. Call 911. Do not wait to see if the symptoms will go away. Do not drive yourself to the  hospital. Summary Blood pressure is a measurement of how strongly your blood is pressing against the walls of your arteries. A blood pressure reading consists of a higher number over a lower number. Ideally, your blood pressure should be below 120/80. Check your blood pressure at the same time every day. Avoid caffeine, alcohol, smoking, and exercise for 30 minutes prior to checking your blood pressure. These agents can affect the accuracy of the blood pressure reading. This information is not intended to replace advice given to you by your health care provider. Make sure you discuss any questions you have with your health care provider. Document Revised: 12/26/2020 Document Reviewed: 12/26/2020 Elsevier Patient Education  2023 Elsevier Inc.  Managing Post-Traumatic Stress Disorder If you have been diagnosed with post-traumatic stress disorder (PTSD), you may be relieved that you now know why you have felt or behaved a certain way. Still, you may feel overwhelmed and concerned for your future.  With the right treatment and support, you can live your life with fewer symptoms. What actions can I take to manage PTSD? Manage stress Stress is your body's reaction to life changes and events, both good and bad. Stress can make PTSD  worse. Take the following steps to manage stress: Talk with your health care provider or a counselor about ways to lower your stress. These may include: Physical exercise. Meditation, yoga, or other mind-body exercises. Exercises to relax your muscles. Breathing exercises. Listening to quiet music. Spending time outside. Eat a healthy diet. Get plenty of sleep. Take time to relax. Spend time with others. Talk with them about how you are feeling and what you need. Do activities that you enjoy. Have a schedule and take regular breaks. Take your medicines Symptoms of PTSD can make you feel depressed and anxious. They can also disrupt reality. Medicines can help lessen  symptoms and make you feel better. It is important to: Talk with your pharmacist or health care provider about all medicines you take. Talk about the side effects. Discuss which medicines are safe to take together. Be involved in your care. Decide on the best treatment plan with your health care provider. Be aware that if you decide to take medicine, it can take 4-8 weeks before you notice a difference in your symptoms. Talk to your health care provider before stopping medicines. Stay close to family and friends PTSD can affect relationships. Make an effort to: Trust your friends and loved ones. Trusting others can help you feel safe and connect you with emotional support. Be open and honest about your feelings. Have fun and relax in safe spaces, such as with friends and family. Think about going to couples counseling, family education classes, or family therapy. Therapy can be helpful for everyone. Talk with friends and family about how they can best support you. What are signs that my condition is getting worse? Be aware of your symptoms and how often you have them. The following symptoms mean that you may need additional help: You feel suspicious and angry. You have repeated flashbacks. You avoid going out or being with others. You have more fights with close friends or family members. You have thoughts about hurting yourself or others. You cannot get relief from feelings of depression or anxiety. Follow these instructions at home: Lifestyle Exercise at least 30 minutes a few times a week. Try to get 7-9 hours of sleep each night. To help with sleep: Keep your bedroom cool and dark. Do not eat a heavy meal within 1 hour of bedtime. Avoid caffeine for at least 8 hours before bed. Avoid screen time before bed. This includes television, computers, tablets, and mobile phones. Try to have fun and find humor in your life. Eat a healthy diet. Write your thoughts and feelings down in a  journal or on a tablet or mobile phone. General instructions Take over-the-counter and prescription medicines only as told by your health care provider. Do not useillegal drugs. Know and remember that healing from trauma takes time. Keep your health care team up to date about your PTSD symptoms and treatment. Alcohol use Do not drink alcohol if: Your health care provider tells you not to drink. You are pregnant, may be pregnant, or are planning to become pregnant. If you drink alcohol: Limit how much you have to: 0-1 drink a day for women. 0-2 drinks a day for men. Know how much alcohol is in your drink. In the [place] one drink equals one 12 oz bottle of beer (355 mL), one 5 oz glass of wine (148 mL), or one 1 oz glass of hard liquor (44 mL). Where to find more information For more information about PTSD, treatment, and how to get support, go  to: [employer] Mental [employer]: BloggerCourse.com National Center for PTSD: ptsd.FitBoxer.tn Contact a health care provider if: Your symptoms get worse or do not get better. You have trouble doing daily tasks. You have loss of appetite. You have trouble sleeping. Get help right away if: You have thoughts about hurting yourself or others. Get help right away if you feel like you may hurt yourself or others, or have thoughts about taking your own life. Go to your nearest emergency room or: Call 911. Call the National Suicide Prevention Lifeline at (747)830-3266 or 988. This is open 24 hours a day. Text the Crisis Text Line at 856-417-9955. Summary With the right treatment and support, you can live your life with fewer symptoms. Find supportive people and settings. Spend time in those places. Keep in contact with those people. Work with your health care team to create a plan to manage the symptoms of PTSD. The plan should include counseling, good habits, and techniques to reduce stress. This information is not intended to replace advice given to you by  your health care provider. Make sure you discuss any questions you have with your health care provider. Document Revised: 08/01/2021 Document Reviewed: 08/01/2021 Elsevier Patient Education  2023 [employer].

## 2021-12-24 ENCOUNTER — Other Ambulatory Visit: Payer: Self-pay

## 2021-12-24 MED ORDER — METHYLPREDNISOLONE ACETATE 40 MG/ML IJ SUSP: 40.0000 mg | Freq: Once | INTRAMUSCULAR | Status: DC

## 2021-12-25 ENCOUNTER — Encounter: Payer: Self-pay | Admitting: Physician Assistant

## 2021-12-25 DIAGNOSIS — I160 Hypertensive urgency: Secondary | ICD-10-CM | POA: Insufficient documentation

## 2021-12-25 DIAGNOSIS — F411 Generalized anxiety disorder: Secondary | ICD-10-CM | POA: Insufficient documentation

## 2021-12-25 DIAGNOSIS — G8929 Other chronic pain: Secondary | ICD-10-CM | POA: Insufficient documentation

## 2021-12-25 DIAGNOSIS — F4310 Post-traumatic stress disorder, unspecified: Secondary | ICD-10-CM | POA: Insufficient documentation

## 2022-01-01 ENCOUNTER — Ambulatory Visit: Payer: Self-pay | Attending: Critical Care Medicine

## 2022-01-01 DIAGNOSIS — I160 Hypertensive urgency: Secondary | ICD-10-CM

## 2022-01-01 DIAGNOSIS — F411 Generalized anxiety disorder: Secondary | ICD-10-CM

## 2022-01-02 LAB — CBC WITH DIFFERENTIAL/PLATELET
Basophils Absolute: 0 10*3/uL (ref 0.0–0.2)
Basos: 0 %
EOS (ABSOLUTE): 0.2 10*3/uL (ref 0.0–0.4)
Eos: 3 %
Hematocrit: 41.4 % (ref 37.5–51.0)
Hemoglobin: 14.3 g/dL (ref 13.0–17.7)
Immature Grans (Abs): 0 10*3/uL (ref 0.0–0.1)
Immature Granulocytes: 0 %
Lymphocytes Absolute: 2.2 10*3/uL (ref 0.7–3.1)
Lymphs: 35 %
MCH: 32.3 pg (ref 26.6–33.0)
MCHC: 34.5 g/dL (ref 31.5–35.7)
MCV: 94 fL (ref 79–97)
Monocytes Absolute: 0.4 10*3/uL (ref 0.1–0.9)
Monocytes: 7 %
Neutrophils Absolute: 3.3 10*3/uL (ref 1.4–7.0)
Neutrophils: 55 %
Platelets: 237 10*3/uL (ref 150–450)
RBC: 4.43 x10E6/uL (ref 4.14–5.80)
RDW: 12.1 % (ref 11.6–15.4)
WBC: 6.2 10*3/uL (ref 3.4–10.8)

## 2022-01-02 LAB — COMP. METABOLIC PANEL (12)
AST: 19 IU/L (ref 0–40)
Albumin/Globulin Ratio: 1.9 (ref 1.2–2.2)
Albumin: 4.4 g/dL (ref 3.8–4.9)
Alkaline Phosphatase: 78 IU/L (ref 44–121)
BUN/Creatinine Ratio: 22 — ABNORMAL HIGH (ref 9–20)
BUN: 22 mg/dL (ref 6–24)
Bilirubin Total: <0.2 mg/dL (ref 0.0–1.2)
Calcium: 9.2 mg/dL (ref 8.7–10.2)
Chloride: 105 mmol/L (ref 96–106)
Creatinine, Ser: 1.02 mg/dL (ref 0.76–1.27)
Globulin, Total: 2.3 g/dL (ref 1.5–4.5)
Glucose: 115 mg/dL — ABNORMAL HIGH (ref 70–99)
Potassium: 4.1 mmol/L (ref 3.5–5.2)
Sodium: 143 mmol/L (ref 134–144)
Total Protein: 6.7 g/dL (ref 6.0–8.5)
eGFR: 89 mL/min/{1.73_m2} (ref >59)

## 2022-01-02 LAB — VITAMIN D 25 HYDROXY (VIT D DEFICIENCY, FRACTURES): Vit D, 25-Hydroxy: 63.5 ng/mL (ref 30.0–100.0)

## 2022-01-08 ENCOUNTER — Ambulatory Visit: Payer: Self-pay | Attending: Physician Assistant | Admitting: Physician Assistant

## 2022-01-08 ENCOUNTER — Other Ambulatory Visit: Payer: Self-pay

## 2022-01-08 ENCOUNTER — Encounter: Payer: Self-pay | Admitting: Physician Assistant

## 2022-01-08 VITALS — BP 176/102 | HR 71 | Ht 72.0 in | Wt 130.6 lb

## 2022-01-08 DIAGNOSIS — F411 Generalized anxiety disorder: Secondary | ICD-10-CM

## 2022-01-08 DIAGNOSIS — I10 Essential (primary) hypertension: Secondary | ICD-10-CM

## 2022-01-08 DIAGNOSIS — M5441 Lumbago with sciatica, right side: Secondary | ICD-10-CM

## 2022-01-08 DIAGNOSIS — G8929 Other chronic pain: Secondary | ICD-10-CM

## 2022-01-08 DIAGNOSIS — M5442 Lumbago with sciatica, left side: Secondary | ICD-10-CM

## 2022-01-08 DIAGNOSIS — G4700 Insomnia, unspecified: Secondary | ICD-10-CM

## 2022-01-08 MED ORDER — BUSPIRONE HCL 10 MG PO TABS
10.0000 mg | ORAL_TABLET | Freq: Two times a day (BID) | ORAL | 2 refills | Status: DC
  Filled 2022-01-08: qty 60, 30d supply, fill #0

## 2022-01-08 MED ORDER — LOSARTAN POTASSIUM 50 MG PO TABS
50.0000 mg | ORAL_TABLET | Freq: Every day | ORAL | 1 refills | Status: DC
  Filled 2022-01-08: qty 90, 90d supply, fill #0

## 2022-01-08 MED ORDER — TRAZODONE HCL 50 MG PO TABS
25.0000 mg | ORAL_TABLET | Freq: Every evening | ORAL | 3 refills | Status: DC | PRN
  Filled 2022-01-08: qty 30, 30d supply, fill #0

## 2022-01-08 MED ORDER — CLONIDINE HCL 0.1 MG PO TABS
0.1000 mg | ORAL_TABLET | Freq: Once | ORAL | Status: AC
  Administered 2022-01-08: 0.1 mg via ORAL

## 2022-01-08 NOTE — Progress Notes (Signed)
Patient ID: Franklin Anderson, male   DOB: 1971/04/14, 51 y.o.   MRN: [id number]   Franklin Anderson, is a 51 y.o. male  HOZ:[id number]  BCW:[id number]  DOB - 21-Mar-1971  Chief Complaint  Patient presents with   Back Pain   Hypertension       Subjective:   Franklin Anderson is a 51 y.o. male here today for a follow up visit after being seen by Cari on the mobile unit 12/23/2021.  At that time BP was 182/119-he was given 0.1 clonidine and tolerated well.  He was placed on metoprolol 50 bid which he has been compliant with.  He denies HA/CP/SOB/changed vision.    The ortho referral she placed isn't going to work for them bc the office wanted $104 up front.  He would like pain management Rx.  He also c/o longstanding anxiety and only xanax or clonazepam has worked in the past.  Not interested in counseling.  Just got married 1 month ago and wife is with him today.    Also has trouble sleeping.  Denies SI/HI   No problems updated.  ALLERGIES: Allergies  Allergen Reactions   Sulfa Antibiotics Anaphylaxis   Penicillins Other (See Comments)    PAST MEDICAL HISTORY: Past Medical History:  Diagnosis Date   Drug abuse (HCC)    H/O: rheumatic fever    Diagnosed at 51 y/o.  Resolved at 51 y/o.  States he was diagnosed with heart murmur felt due to this   Hypertension 2012    MEDICATIONS AT HOME: Prior to Admission medications   Medication Sig Start Date End Date Taking? Authorizing Provider  acetaminophen (TYLENOL) 325 MG tablet Take 2 tablets (650 mg total) by mouth every 6 (six) hours as needed for mild pain (or Fever >/= 101). 04/14/19  Yes Sheth, Devam P, MD  busPIRone (BUSPAR) 10 MG tablet Take 1 tablet (10 mg total) by mouth 2 (two) times daily. 01/08/22  Yes Laddie Naeem, Marzella Schlein, PA-C  losartan (COZAAR) 50 MG tablet Take 1 tablet (50 mg total) by mouth daily. 01/08/22  Yes Shaarav Ripple, Marzella Schlein, PA-C  meloxicam (MOBIC) 15 MG tablet Take 1 tablet (15 mg total) by mouth daily. 12/23/21  Yes  Mayers, Cari S, PA-C  metoprolol tartrate (LOPRESSOR) 50 MG tablet Take 1 tablet (50 mg total) by mouth 2 (two) times daily. 12/23/21  Yes Mayers, Cari S, PA-C  traZODone (DESYREL) 50 MG tablet Take 0.5-1 tablets (25-50 mg total) by mouth at bedtime as needed for sleep. 01/08/22  Yes Valerye Kobus, Marzella Schlein, PA-C  acetaminophen-codeine (TYLENOL #3) 300-30 MG tablet Take 1 tablet by mouth every 4 (four) hours as needed for moderate pain. Patient not taking: Reported on 12/23/2021 10/18/19   Anders Simmonds, PA-C  albuterol (VENTOLIN HFA) 108 (90 Base) MCG/ACT inhaler Inhale 2 puffs into the lungs every 6 (six) hours as needed for wheezing or shortness of breath. Patient not taking: Reported on 12/23/2021 10/18/19   Anders Simmonds, PA-C  feeding supplement, ENSURE ENLIVE, (ENSURE ENLIVE) LIQD Take 237 mLs by mouth 2 (two) times daily between meals. Patient not taking: Reported on 06/28/2019 04/14/19   Saundra Shelling, MD  gabapentin (NEURONTIN) 300 MG capsule Take 2 capsules (600 mg total) by mouth 3 (three) times daily. 10/18/19 11/17/19  Anders Simmonds, PA-C  lidocaine (LIDODERM) 5 % Place 1 patch onto the skin daily. Remove & Discard patch within 12 hours or as directed by MD Patient not taking: Reported on 12/23/2021 04/14/19  Sheth, Devam P, MD  ondansetron (ZOFRAN) 4 MG tablet Take 1 tablet (4 mg total) by mouth every 6 (six) hours as needed for nausea. Patient not taking: Reported on 09/06/2019 04/14/19   Saundra Shelling, MD  pantoprazole (PROTONIX) 40 MG tablet Take 1 tablet (40 mg total) by mouth daily. Patient not taking: Reported on 09/06/2019 04/15/19   Saundra Shelling, MD  tiZANidine (ZANAFLEX) 4 MG tablet 1/2 pill every 8 hours as needed for muscle spasm and one pill at bedtime if needed Patient not taking: Reported on 12/23/2021 06/09/19   Fulp, Cammie, MD    ROS: Neg HEENT Neg resp Neg cardiac Neg GI Neg GU Neg psych Neg neuro  Objective:   Vitals:   01/08/22 1617 01/08/22 1646  BP:  (!) 198/115 (!) 176/102  Pulse: 71   SpO2: 96%   Weight: 130 lb 9.6 oz (59.2 kg)   Height: 6' (1.829 m)    Wife paces the room and talks a lot for the patient.  Exam General appearance : Awake, alert, not in any distress. Speech Clear. Not toxic looking; very thin, anxious affect.  Appears older than stated age HEENT: Atraumatic and Normocephalic Neck: Supple, no JVD. No cervical lymphadenopathy.  Chest: Good air entry bilaterally, CTAB.  No rales/rhonchi/wheezing CVS: S1 S2 regular, no murmurs.  Extremities: B/L Lower Ext shows no edema, both legs are warm to touch Neurology: Awake alert, and oriented X 3, CN II-XII intact, Non focal Skin: No Rash  Data Review Lab Results  Component Value Date   HGBA1C 4.5 (L) 04/11/2019   HGBA1C 6.4 (H) 02/28/2019    Assessment & Plan   1. Hypertension, unspecified type Uncontrolled-came down a little after clonidine.  Continue metoprolol - cloNIDine (CATAPRES) tablet 0.1 mg add- losartan (COZAAR) 50 MG tablet; Take 1 tablet (50 mg total) by mouth daily.  Dispense: 90 tablet; Refill: 1 Check blood pressure 3-4 times weekly and record.  Bring numbers to next appt  2. Chronic bilateral low back pain with bilateral sciatica - Ambulatory referral to Pain Clinic  3. GAD (generalized anxiety disorder) Can try - busPIRone (BUSPAR) 10 MG tablet; Take 1 tablet (10 mg total) by mouth 2 (two) times daily.  Dispense: 60 tablet; Refill: 2 -explained why benzos are not safe for ongoing use of anxiety  4. Insomnia, unspecified type - traZODone (DESYREL) 50 MG tablet; Take 0.5-1 tablets (25-50 mg total) by mouth at bedtime as needed for sleep.  Dispense: 30 tablet; Refill: 3    Return for 3 weeks with Franklin Anderson and assign new PCP here in 3 months.  The patient was given clear instructions to go to ER or return to medical center if symptoms don't improve, worsen or new problems develop. The patient verbalized understanding. The patient was told to call to  get lab results if they haven't heard anything in the next week.      Georgian Co, PA-C [facility] [place], [place] [phone]   01/08/2022, 4:48 PM

## 2022-01-08 NOTE — Patient Instructions (Signed)
Check blood pressure 3-4 times weekly and record.  Bring numbers to next appt  Insomnia Insomnia is a sleep disorder that makes it difficult to fall asleep or stay asleep. Insomnia can cause fatigue, low energy, difficulty concentrating, mood swings, and poor performance at work or school. There are three different ways to classify insomnia: Difficulty falling asleep. Difficulty staying asleep. Waking up too early in the morning. Any type of insomnia can be long-term (chronic) or short-term (acute). Both are common. Short-term insomnia usually lasts for 3 months or less. Chronic insomnia occurs at least three times a week for longer than 3 months. What are the causes? Insomnia may be caused by another condition, situation, or substance, such as: Having certain mental health conditions, such as anxiety and depression. Using caffeine, alcohol, tobacco, or drugs. Having gastrointestinal conditions, such as gastroesophageal reflux disease (GERD). Having certain medical conditions. These include: Asthma. Alzheimer's disease. Stroke. Chronic pain. An overactive thyroid gland (hyperthyroidism). Other sleep disorders, such as restless legs syndrome and sleep apnea. Menopause. Sometimes, the cause of insomnia may not be known. What increases the risk? Risk factors for insomnia include: Gender. Females are affected more often than males. Age. Insomnia is more common as people get older. Stress and certain medical and mental health conditions. Lack of exercise. Having an irregular work schedule. This may include working night shifts and traveling between different time zones. What are the signs or symptoms? If you have insomnia, the main symptom is having trouble falling asleep or having trouble staying asleep. This may lead to other symptoms, such as: Feeling tired or having low energy. Feeling nervous about going to sleep. Not feeling rested in the morning. Having trouble  concentrating. Feeling irritable, anxious, or depressed. How is this diagnosed? This condition may be diagnosed based on: Your symptoms and medical history. Your health care provider may ask about: Your sleep habits. Any medical conditions you have. Your mental health. A physical exam. How is this treated? Treatment for insomnia depends on the cause. Treatment may focus on treating an underlying condition that is causing the insomnia. Treatment may also include: Medicines to help you sleep. Counseling or therapy. Lifestyle adjustments to help you sleep better. Follow these instructions at home: Eating and drinking  Limit or avoid alcohol, caffeinated beverages, and products that contain nicotine and tobacco, especially close to bedtime. These can disrupt your sleep. Do not eat a large meal or eat spicy foods right before bedtime. This can lead to digestive discomfort that can make it hard for you to sleep. Sleep habits  Keep a sleep diary to help you and your health care provider figure out what could be causing your insomnia. Write down: When you sleep. When you wake up during the night. How well you sleep and how rested you feel the next day. Any side effects of medicines you are taking. What you eat and drink. Make your bedroom a dark, comfortable place where it is easy to fall asleep. Put up shades or blackout curtains to block light from outside. Use a white noise machine to block noise. Keep the temperature cool. Limit screen use before bedtime. This includes: Not watching TV. Not using your smartphone, tablet, or computer. Stick to a routine that includes going to bed and waking up at the same times every day and night. This can help you fall asleep faster. Consider making a quiet activity, such as reading, part of your nighttime routine. Try to avoid taking naps during the day so that  you sleep better at night. Get out of bed if you are still awake after 15 minutes of  trying to sleep. Keep the lights down, but try reading or doing a quiet activity. When you feel sleepy, go back to bed. General instructions Take over-the-counter and prescription medicines only as told by your health care provider. Exercise regularly as told by your health care provider. However, avoid exercising in the hours right before bedtime. Use relaxation techniques to manage stress. Ask your health care provider to suggest some techniques that may work well for you. These may include: Breathing exercises. Routines to release muscle tension. Visualizing peaceful scenes. Make sure that you drive carefully. Do not drive if you feel very sleepy. Keep all follow-up visits. This is important. Contact a health care provider if: You are tired throughout the day. You have trouble in your daily routine due to sleepiness. You continue to have sleep problems, or your sleep problems get worse. Get help right away if: You have thoughts about hurting yourself or someone else. Get help right away if you feel like you may hurt yourself or others, or have thoughts about taking your own life. Go to your nearest emergency room or: Call 911. Call the National Suicide Prevention Lifeline at (825) 610-1134 or 988. This is open 24 hours a day. Text the Crisis Text Line at (226)759-4734. Summary Insomnia is a sleep disorder that makes it difficult to fall asleep or stay asleep. Insomnia can be long-term (chronic) or short-term (acute). Treatment for insomnia depends on the cause. Treatment may focus on treating an underlying condition that is causing the insomnia. Keep a sleep diary to help you and your health care provider figure out what could be causing your insomnia. This information is not intended to replace advice given to you by your health care provider. Make sure you discuss any questions you have with your health care provider. Document Revised: 03/24/2021 Document Reviewed: 03/24/2021 Elsevier  Patient Education  2023 [employer] Inc. Generalized Anxiety Disorder, Adult Generalized anxiety disorder (GAD) is a mental health condition. Unlike normal worries, anxiety related to GAD is not triggered by a specific event. These worries do not fade or get better with time. GAD interferes with relationships, work, and school. GAD symptoms can vary from mild to severe. People with severe GAD can have intense waves of anxiety with physical symptoms that are similar to panic attacks. What are the causes? The exact cause of GAD is not known, but the following are believed to have an impact: Differences in natural brain chemicals. Genes passed down from parents to children. Differences in the way threats are perceived. Development and stress during childhood. Personality. What increases the risk? The following factors may make you more likely to develop this condition: Being male. Having a family history of anxiety disorders. Being very shy. Experiencing very stressful life events, such as the death of a loved one. Having a very stressful family environment. What are the signs or symptoms? People with GAD often worry excessively about many things in their lives, such as their health and family. Symptoms may also include: Mental and emotional symptoms: Worrying excessively about natural disasters. Fear of being late. Difficulty concentrating. Fears that others are judging your performance. Physical symptoms: Fatigue. Headaches, muscle tension, muscle twitches, trembling, or feeling shaky. Feeling like your heart is pounding or beating very fast. Feeling out of breath or like you cannot take a deep breath. Having trouble falling asleep or staying asleep, or experiencing restlessness. Sweating. Nausea,  diarrhea, or irritable bowel syndrome (IBS). Behavioral symptoms: Experiencing erratic moods or irritability. Avoidance of new situations. Avoidance of people. Extreme difficulty making  decisions. How is this diagnosed? This condition is diagnosed based on your symptoms and medical history. You will also have a physical exam. Your health care provider may perform tests to rule out other possible causes of your symptoms. To be diagnosed with GAD, a person must have anxiety that: Is out of his or her control. Affects several different aspects of his or her life, such as work and relationships. Causes distress that makes him or her unable to take part in normal activities. Includes at least three symptoms of GAD, such as restlessness, fatigue, trouble concentrating, irritability, muscle tension, or sleep problems. Before your health care provider can confirm a diagnosis of GAD, these symptoms must be present more days than they are not, and they must last for 6 months or longer. How is this treated? This condition may be treated with: Medicine. Antidepressant medicine is usually prescribed for long-term daily control. Anti-anxiety medicines may be added in severe cases, especially when panic attacks occur. Talk therapy (psychotherapy). Certain types of talk therapy can be helpful in treating GAD by providing support, education, and guidance. Options include: Cognitive behavioral therapy (CBT). People learn coping skills and self-calming techniques to ease their physical symptoms. They learn to identify unrealistic thoughts and behaviors and to replace them with more appropriate thoughts and behaviors. Acceptance and commitment therapy (ACT). This treatment teaches people how to be mindful as a way to cope with unwanted thoughts and feelings. Biofeedback. This process trains you to manage your body's response (physiological response) through breathing techniques and relaxation methods. You will work with a therapist while machines are used to monitor your physical symptoms. Stress management techniques. These include yoga, meditation, and exercise. A mental health specialist can help  determine which treatment is best for you. Some people see improvement with one type of therapy. However, other people require a combination of therapies. Follow these instructions at home: Lifestyle Maintain a consistent routine and schedule. Anticipate stressful situations. Create a plan and allow extra time to work with your plan. Practice stress management or self-calming techniques that you have learned from your therapist or your health care provider. Exercise regularly and spend time outdoors. Eat a healthy diet that includes plenty of vegetables, fruits, whole grains, low-fat dairy products, and lean protein. Do not eat a lot of foods that are high in fat, added sugar, or salt (sodium). Drink plenty of water. Avoid alcohol. Alcohol can increase anxiety. Avoid caffeine and certain over-the-counter cold medicines. These may make you feel worse. Ask your pharmacist which medicines to avoid. General instructions Take over-the-counter and prescription medicines only as told by your health care provider. Understand that you are likely to have setbacks. Accept this and be kind to yourself as you persist to take better care of yourself. Anticipate stressful situations. Create a plan and allow extra time to work with your plan. Recognize and accept your accomplishments, even if you judge them as small. Spend time with people who care about you. Keep all follow-up visits. This is important. Where to find more information [employer]: http://www.maynard.net/ Substance Abuse and Mental Health Services: [employer].pt Contact a health care provider if: Your symptoms do not get better. Your symptoms get worse. You have signs of depression, such as: A persistently sad or irritable mood. Loss of enjoyment in activities that used to bring you joy. Change  in weight or eating. Changes in sleeping habits. Get help right away if: You have thoughts about hurting yourself or  others. If you ever feel like you may hurt yourself or others, or have thoughts about taking your own life, get help right away. Go to your nearest emergency department or: Call your local emergency services (911 in the [place] Call a suicide crisis helpline, such as the National Suicide Prevention Lifeline at 909-343-2202 or 988 in the [place] This is open 24 hours a day in the [place] Text the Crisis Text Line at 838 343 9763 (in the [place] Summary Generalized anxiety disorder (GAD) is a mental health condition that involves worry that is not triggered by a specific event. People with GAD often worry excessively about many things in their lives, such as their health and family. GAD may cause symptoms such as restlessness, trouble concentrating, sleep problems, frequent sweating, nausea, diarrhea, headaches, and trembling or muscle twitching. A mental health specialist can help determine which treatment is best for you. Some people see improvement with one type of therapy. However, other people require a combination of therapies. This information is not intended to replace advice given to you by your health care provider. Make sure you discuss any questions you have with your health care provider. Document Revised: 11/06/2020 Document Reviewed: 08/04/2020 Elsevier Patient Education  2023 Elsevier Inc. Hypertension, Adult Hypertension is another name for high blood pressure. High blood pressure forces your heart to work harder to pump blood. This can cause problems over time. There are two numbers in a blood pressure reading. There is a top number (systolic) over a bottom number (diastolic). It is best to have a blood pressure that is below 120/80. What are the causes? The cause of this condition is not known. Some other conditions can lead to high blood pressure. What increases the risk? Some lifestyle factors can make you more likely to develop high blood pressure: Smoking. Not getting enough exercise  or physical activity. Being overweight. Having too much fat, sugar, calories, or salt (sodium) in your diet. Drinking too much alcohol. Other risk factors include: Having any of these conditions: Heart disease. Diabetes. High cholesterol. Kidney disease. Obstructive sleep apnea. Having a family history of high blood pressure and high cholesterol. Age. The risk increases with age. Stress. What are the signs or symptoms? High blood pressure may not cause symptoms. Very high blood pressure (hypertensive crisis) may cause: Headache. Fast or uneven heartbeats (palpitations). Shortness of breath. Nosebleed. Vomiting or feeling like you may vomit (nauseous). Changes in how you see. Very bad chest pain. Feeling dizzy. Seizures. How is this treated? This condition is treated by making healthy lifestyle changes, such as: Eating healthy foods. Exercising more. Drinking less alcohol. Your doctor may prescribe medicine if lifestyle changes do not help enough and if: Your top number is above 130. Your bottom number is above 80. Your personal target blood pressure may vary. Follow these instructions at home: Eating and drinking  If told, follow the DASH eating plan. To follow this plan: Fill one half of your plate at each meal with fruits and vegetables. Fill one fourth of your plate at each meal with whole grains. Whole grains include whole-wheat pasta, brown rice, and whole-grain bread. Eat or drink low-fat dairy products, such as skim milk or low-fat yogurt. Fill one fourth of your plate at each meal with low-fat (lean) proteins. Low-fat proteins include fish, chicken without skin, eggs, beans, and tofu. Avoid fatty meat, cured and processed meat,  or chicken with skin. Avoid pre-made or processed food. Limit the amount of salt in your diet to less than 1,500 mg each day. Do not drink alcohol if: Your doctor tells you not to drink. You are pregnant, may be pregnant, or are planning  to become pregnant. If you drink alcohol: Limit how much you have to: 0-1 drink a day for women. 0-2 drinks a day for men. Know how much alcohol is in your drink. In the [place] one drink equals one 12 oz bottle of beer (355 mL), one 5 oz glass of wine (148 mL), or one 1 oz glass of hard liquor (44 mL). Lifestyle  Work with your doctor to stay at a healthy weight or to lose weight. Ask your doctor what the best weight is for you. Get at least 30 minutes of exercise that causes your heart to beat faster (aerobic exercise) most days of the week. This may include walking, swimming, or biking. Get at least 30 minutes of exercise that strengthens your muscles (resistance exercise) at least 3 days a week. This may include lifting weights or doing Pilates. Do not smoke or use any products that contain nicotine or tobacco. If you need help quitting, ask your doctor. Check your blood pressure at home as told by your doctor. Keep all follow-up visits. Medicines Take over-the-counter and prescription medicines only as told by your doctor. Follow directions carefully. Do not skip doses of blood pressure medicine. The medicine does not work as well if you skip doses. Skipping doses also puts you at risk for problems. Ask your doctor about side effects or reactions to medicines that you should watch for. Contact a doctor if: You think you are having a reaction to the medicine you are taking. You have headaches that keep coming back. You feel dizzy. You have swelling in your ankles. You have trouble with your vision. Get help right away if: You get a very bad headache. You start to feel mixed up (confused). You feel weak or numb. You feel faint. You have very bad pain in your: Chest. Belly (abdomen). You vomit more than once. You have trouble breathing. These symptoms may be an emergency. Get help right away. Call 911. Do not wait to see if the symptoms will go away. Do not drive yourself to the  hospital. Summary Hypertension is another name for high blood pressure. High blood pressure forces your heart to work harder to pump blood. For most people, a normal blood pressure is less than 120/80. Making healthy choices can help lower blood pressure. If your blood pressure does not get lower with healthy choices, you may need to take medicine. This information is not intended to replace advice given to you by your health care provider. Make sure you discuss any questions you have with your health care provider. Document Revised: 01/30/2021 Document Reviewed: 01/30/2021 Elsevier Patient Education  2023 [employer]

## 2022-01-09 ENCOUNTER — Other Ambulatory Visit: Payer: Self-pay

## 2022-01-12 ENCOUNTER — Telehealth: Payer: Self-pay | Admitting: *Deleted

## 2022-01-12 ENCOUNTER — Ambulatory Visit: Payer: Self-pay | Admitting: *Deleted

## 2022-01-12 ENCOUNTER — Telehealth: Payer: Self-pay

## 2022-01-12 NOTE — Telephone Encounter (Signed)
Error

## 2022-01-12 NOTE — Telephone Encounter (Signed)
  Chief Complaint: SOB Symptoms: SOB with exertion, coughing. Productive cough, whitish.H/O COPD. Using inhaler more often. Discomfort right sided "Lung" Frequency: 4-5 days ago Pertinent Negatives: Patient denies wheezing, fever Disposition: [] ED /[] Urgent Care (no appt availability in office) / [] Appointment(In office/virtual)/ [x]  [facility]/ [] Home Care/ [] Refused Recommended Disposition /[] [facility]/ []  Follow-up with PCP Additional Notes:Pt called for lab results, mentioned SOB, triaged. [facility] for pt. Has established care at [facility]. Reason for Disposition  [1] Longstanding difficulty breathing (e.g., CHF, COPD, emphysema) AND [2] WORSE than normal  Answer Assessment - Initial Assessment Questions 1. RESPIRATORY STATUS: "Describe your breathing?" (e.g., wheezing, shortness of breath, unable to speak, severe coughing)      With exertion 2. ONSET: "When did this breathing problem begin?"      4-5 days ago 3. PATTERN "Does the difficult breathing come and go, or has it been constant since it started?"      Comes and goes 4. SEVERITY: "How bad is your breathing?" (e.g., mild, moderate, severe)    - MILD: No SOB at rest, mild SOB with walking, speaks normally in sentences, can lie down, no retractions, pulse < 100.    - MODERATE: SOB at rest, SOB with minimal exertion and prefers to sit, cannot lie down flat, speaks in phrases, mild retractions, audible wheezing, pulse 100-120.    - SEVERE: Very SOB at rest, speaks in single words, struggling to breathe, sitting hunched forward, retractions, pulse > 120      moderate 5. RECURRENT SYMPTOM: "Have you had difficulty breathing before?" If Yes, ask: "When was the last time?" and "What happened that time?"      Yes H/O COPD 6. CARDIAC HISTORY: "Do you have any history of heart disease?" (e.g., heart attack, angina, bypass surgery, angioplasty)       7. LUNG HISTORY: "Do you have any history of lung disease?"   (e.g., pulmonary embolus, asthma, emphysema)     COPD 8. CAUSE: "What do you think is causing the breathing problem?"      COPD 9. OTHER SYMPTOMS: "Do you have any other symptoms? (e.g., dizziness, runny nose, cough, chest pain, fever)    Cough, whitish.  Protocols used: Breathing Difficulty-A-AH

## 2022-01-15 ENCOUNTER — Ambulatory Visit: Payer: Self-pay | Admitting: Surgery

## 2022-01-15 ENCOUNTER — Emergency Department (HOSPITAL_COMMUNITY)
Admission: EM | Admit: 2022-01-15 | Discharge: 2022-01-15 | Disposition: A | Discharge status: Home / Self Care | Payer: Self-pay | Attending: Emergency Medicine | Admitting: Emergency Medicine

## 2022-01-15 ENCOUNTER — Emergency Department (HOSPITAL_COMMUNITY): Payer: Self-pay

## 2022-01-15 ENCOUNTER — Encounter (HOSPITAL_COMMUNITY): Payer: Self-pay | Admitting: Emergency Medicine

## 2022-01-15 DIAGNOSIS — I10 Essential (primary) hypertension: Secondary | ICD-10-CM | POA: Insufficient documentation

## 2022-01-15 DIAGNOSIS — Z7982 Long term (current) use of aspirin: Secondary | ICD-10-CM | POA: Insufficient documentation

## 2022-01-15 DIAGNOSIS — F1110 Opioid abuse, uncomplicated: Secondary | ICD-10-CM

## 2022-01-15 DIAGNOSIS — R0789 Other chest pain: Secondary | ICD-10-CM

## 2022-01-15 DIAGNOSIS — R0602 Shortness of breath: Secondary | ICD-10-CM | POA: Insufficient documentation

## 2022-01-15 DIAGNOSIS — Z79899 Other long term (current) drug therapy: Secondary | ICD-10-CM | POA: Insufficient documentation

## 2022-01-15 LAB — CBC WITH DIFFERENTIAL/PLATELET
Abs Immature Granulocytes: 0.02 10*3/uL (ref 0.00–0.07)
Basophils Absolute: 0 10*3/uL (ref 0.0–0.1)
Basophils Relative: 0 %
Eosinophils Absolute: 0 10*3/uL (ref 0.0–0.5)
Eosinophils Relative: 0 %
HCT: 40.2 % (ref 39.0–52.0)
Hemoglobin: 13.7 g/dL (ref 13.0–17.0)
Immature Granulocytes: 0 %
Lymphocytes Relative: 17 %
Lymphs Abs: 1.3 10*3/uL (ref 0.7–4.0)
MCH: 32.4 pg (ref 26.0–34.0)
MCHC: 34.1 g/dL (ref 30.0–36.0)
MCV: 95 fL (ref 80.0–100.0)
Monocytes Absolute: 0.7 10*3/uL (ref 0.1–1.0)
Monocytes Relative: 9 %
Neutro Abs: 5.7 10*3/uL (ref 1.7–7.7)
Neutrophils Relative %: 74 %
Platelets: 203 10*3/uL (ref 150–400)
RBC: 4.23 MIL/uL (ref 4.22–5.81)
RDW: 12.2 % (ref 11.5–15.5)
WBC: 7.8 10*3/uL (ref 4.0–10.5)
nRBC: 0 % (ref 0.0–0.2)

## 2022-01-15 LAB — BASIC METABOLIC PANEL
Anion gap: 7 (ref 5–15)
BUN: 27 mg/dL — ABNORMAL HIGH (ref 6–20)
CO2: 27 mmol/L (ref 22–32)
Calcium: 8.4 mg/dL — ABNORMAL LOW (ref 8.9–10.3)
Chloride: 104 mmol/L (ref 98–111)
Creatinine, Ser: 0.84 mg/dL (ref 0.61–1.24)
GFR, Estimated: >60 mL/min (ref >60)
Glucose, Bld: 119 mg/dL — ABNORMAL HIGH (ref 70–99)
Potassium: 3.7 mmol/L (ref 3.5–5.1)
Sodium: 138 mmol/L (ref 135–145)

## 2022-01-15 LAB — BRAIN NATRIURETIC PEPTIDE: B Natriuretic Peptide: 133 pg/mL — ABNORMAL HIGH (ref 0.0–100.0)

## 2022-01-15 MED ORDER — ALBUTEROL SULFATE HFA 108 (90 BASE) MCG/ACT IN AERS
1.0000 | INHALATION_SPRAY | Freq: Once | RESPIRATORY_TRACT | Status: AC
  Administered 2022-01-15: 2 via RESPIRATORY_TRACT
  Filled 2022-01-15: qty 6.7

## 2022-01-15 MED ORDER — KETOROLAC TROMETHAMINE 30 MG/ML IJ SOLN
30.0000 mg | Freq: Once | INTRAMUSCULAR | Status: AC
  Administered 2022-01-15: 30 mg via INTRAVENOUS
  Filled 2022-01-15: qty 1

## 2022-01-15 MED ORDER — AEROCHAMBER Z-STAT PLUS/MEDIUM MISC
1.0000 | Freq: Once | Status: AC
  Administered 2022-01-15: 1

## 2022-01-15 MED ORDER — IBUPROFEN 600 MG PO TABS: 600.0000 mg | ORAL_TABLET | Freq: Four times a day (QID) | ORAL | 0 refills | Status: DC | PRN

## 2022-01-15 MED ORDER — ACETAMINOPHEN 500 MG PO TABS
1000.0000 mg | ORAL_TABLET | Freq: Once | ORAL | Status: AC
  Administered 2022-01-15: 1000 mg via ORAL
  Filled 2022-01-15: qty 2

## 2022-01-15 MED ORDER — METHYLPREDNISOLONE SODIUM SUCC 125 MG IJ SOLR
125.0000 mg | Freq: Once | INTRAMUSCULAR | Status: AC
  Administered 2022-01-15: 125 mg via INTRAVENOUS
  Filled 2022-01-15: qty 2

## 2022-01-15 MED ORDER — IPRATROPIUM-ALBUTEROL 0.5-2.5 (3) MG/3ML IN SOLN
3.0000 mL | Freq: Once | RESPIRATORY_TRACT | Status: AC
  Administered 2022-01-15: 3 mL via RESPIRATORY_TRACT
  Filled 2022-01-15: qty 3

## 2022-01-15 MED ORDER — PREDNISONE 50 MG PO TABS: 50.0000 mg | ORAL_TABLET | Freq: Every day | ORAL | 0 refills | Status: DC

## 2022-01-15 NOTE — Discharge Instructions (Addendum)
Try to avoid opiates.  Take your blood pressure medication when you get home.

## 2022-01-15 NOTE — ED Provider Notes (Signed)
[facility] EMERGENCY DEPARTMENT Provider Note   CSN: [id number] Arrival date & time: 01/15/22  2029     History  Chief Complaint  Patient presents with   Drug Overdose   Shortness of Breath    Franklin Anderson is a 51 y.o. male.  Pt is a 51 yo male with a pmhx significant for HTN and opiate abuse.  Pt said he overdosed on Fentanyl last night.  His wife gave him narcan and had to CPR.  Pt has been having chest pain and sob all day.  He is worried his wife broke a rib.  The pt said his wife gave him narcan again today, but he denies using fentanyl today.        Home Medications Prior to Admission medications   Medication Sig Start Date End Date Taking? Authorizing Provider  acetaminophen (TYLENOL) 325 MG tablet Take 2 tablets (650 mg total) by mouth every 6 (six) hours as needed for mild pain (or Fever >/= 101). 04/14/19  Yes Sheth, Devam P, MD  Aspirin-Salicylamide-Caffeine (BC HEADACHE POWDER PO) Take by mouth.   Yes [provider]  hydrOXYzine (ATARAX) 25 MG tablet Take 25 mg by mouth 3 (three) times daily as needed for anxiety.   Yes [provider]  ibuprofen (ADVIL) 600 MG tablet Take 1 tablet (600 mg total) by mouth every 6 (six) hours as needed. 01/15/22  Yes Isla Pence, MD  losartan (COZAAR) 50 MG tablet Take 1 tablet (50 mg total) by mouth daily. 01/08/22  Yes Argentina Donovan, PA-C  metoprolol tartrate (LOPRESSOR) 50 MG tablet Take 1 tablet (50 mg total) by mouth 2 (two) times daily. 12/23/21  Yes Mayers, Cari S, PA-C  predniSONE (DELTASONE) 50 MG tablet Take 1 tablet (50 mg total) by mouth daily with breakfast. 01/15/22  Yes Isla Pence, MD  busPIRone (BUSPAR) 10 MG tablet Take 1 tablet (10 mg total) by mouth 2 (two) times daily. Patient not taking: Reported on 01/15/2022 01/08/22   Argentina Donovan, PA-C  lidocaine (LIDODERM) 5 % Place 1 patch onto the skin daily. Remove & Discard patch within 12 hours or as directed by MD Patient not taking:  Reported on 12/23/2021 04/14/19   Vicenta Dunning, MD  meloxicam (MOBIC) 15 MG tablet Take 1 tablet (15 mg total) by mouth daily. Patient not taking: Reported on 01/15/2022 12/23/21   Mayers, Loraine Grip, PA-C  traZODone (DESYREL) 50 MG tablet Take 0.5-1 tablets (25-50 mg total) by mouth at bedtime as needed for sleep. Patient not taking: Reported on 01/15/2022 01/08/22   Argentina Donovan, PA-C      Allergies    Sulfa antibiotics and Penicillins    Review of Systems   Review of Systems  Respiratory:  Positive for shortness of breath.   Cardiovascular:  Positive for chest pain.  All other systems reviewed and are negative.   Physical Exam Updated Vital Signs BP (!) 175/107   Pulse 95   Temp 98.9 F (37.2 C) (Oral)   Resp 17   Ht 6' (1.829 m)   Wt 59.2 kg   SpO2 92%   BMI 17.71 kg/m  Physical Exam Vitals and nursing note reviewed.  Constitutional:      Appearance: He is well-developed.  HENT:     Head: Normocephalic and atraumatic.     Mouth/Throat:     Mouth: Mucous membranes are dry.  Eyes:     Pupils: Pupils are equal, round, and reactive to light.  Cardiovascular:  Rate and Rhythm: Regular rhythm. Tachycardia present.  Pulmonary:     Effort: Tachypnea present.     Breath sounds: Wheezing present.  Abdominal:     General: Bowel sounds are normal.     Palpations: Abdomen is soft.  Musculoskeletal:        General: Normal range of motion.     Cervical back: Normal range of motion and neck supple.  Skin:    General: Skin is warm.     Capillary Refill: Capillary refill takes less than 2 seconds.  Neurological:     General: No focal deficit present.     Mental Status: He is alert and oriented to person, place, and time.  Psychiatric:        Mood and Affect: Mood normal.        Behavior: Behavior normal.     ED Results / Procedures / Treatments   Labs (all labs ordered are listed, but only abnormal results are displayed) Labs Reviewed  BASIC METABOLIC PANEL -  Abnormal; Notable for the following components:      Result Value   Glucose, Bld 119 (*)    BUN 27 (*)    Calcium 8.4 (*)    All other components within normal limits  BRAIN NATRIURETIC PEPTIDE - Abnormal; Notable for the following components:   B Natriuretic Peptide 133.0 (*)    All other components within normal limits  CBC WITH DIFFERENTIAL/PLATELET    EKG EKG Interpretation  Date/Time:  Thursday January 15 2022 20:38:11 EDT Ventricular Rate:  91 PR Interval:  145 QRS Duration: 87 QT Interval:  356 QTC Calculation: 438 R Axis:   64 Text Interpretation: Sinus rhythm Biatrial enlargement Probable left ventricular hypertrophy ST elev, probable normal early repol pattern No significant change since last tracing Confirmed by Jacalyn Lefevre [phone]) on 01/15/2022 9:03:57 PM  Radiology DG Chest Portable 1 View  Result Date: 01/15/2022 CLINICAL DATA:  Fentanyl overdose status post Narcan, short of breath EXAM: PORTABLE CHEST 1 VIEW COMPARISON:  04/24/2019 FINDINGS: Two frontal views of the chest demonstrate a stable cardiac silhouette. No acute airspace disease, effusion, or pneumothorax. Extensive scarring seen throughout the lungs, likely related to the prior history of septic emboli. No acute bony abnormalities. IMPRESSION: 1. Chronic scarring as above.  No acute airspace disease. Electronically Signed   By: Sharlet Salina M.D.   On: 01/15/2022 20:55    Procedures Procedures    Medications Ordered in ED Medications  acetaminophen (TYLENOL) tablet 1,000 mg (has no administration in time range)  albuterol (VENTOLIN HFA) 108 (90 Base) MCG/ACT inhaler 1-2 puff (has no administration in time range)  aerochamber Z-Stat Plus/medium 1 each (has no administration in time range)  ketorolac (TORADOL) 30 MG/ML injection 30 mg (30 mg Intravenous Given 01/15/22 2108)  ipratropium-albuterol (DUONEB) 0.5-2.5 (3) MG/3ML nebulizer solution 3 mL (3 mLs Nebulization Given 01/15/22 2122)   methylPREDNISolone sodium succinate (SOLU-MEDROL) 125 mg/2 mL injection 125 mg (125 mg Intravenous Given 01/15/22 2109)    ED Course/ Medical Decision Making/ A&P                           Medical Decision Making Amount and/or Complexity of Data Reviewed Labs: ordered. Radiology: ordered.  Risk OTC drugs. Prescription drug management.   This patient presents to the ED for concern of sob, this involves an extensive number of treatment options, and is a complaint that carries with it a high risk of complications and  morbidity.  The differential diagnosis includes ptx, rib fx, copd exac   Co morbidities that complicate the patient evaluation  HTN and opiate abuse   Additional history obtained:  Additional history obtained from epic chart review External records from outside source obtained and reviewed including EMS report   Lab Tests:  I Ordered, and personally interpreted labs.  The pertinent results include:  cbc nl, bmp nl, bnp 133   Imaging Studies ordered:  I ordered imaging studies including cxr  I independently visualized and interpreted imaging which showed  IMPRESSION:  1. Chronic scarring as above.  No acute airspace disease.   I agree with the radiologist interpretation   Cardiac Monitoring:  The patient was maintained on a cardiac monitor.  I personally viewed and interpreted the cardiac monitored which showed an underlying rhythm of: nsr   Medicines ordered and prescription drug management:  I ordered medication including toradol  for pain; duoneb and solumedrol for sob  Reevaluation of the patient after these medicines showed that the patient improved I have reviewed the patients home medicines and have made adjustments as needed   Test Considered:  cxr   Critical Interventions:  nebs   Problem List / ED Course:  Chest wall pain:  no rib fx or pta. SOB:  pt likely has undiagnosed COPD from tobacco abuse.  His breathing is better after  the duoneb and solumedrol.  Pt given an inhaler + spacer at d/c. HTN:  pt has not taken his evening metoprolol.  Pt is to take his evening dose tonight.   Reevaluation:  After the interventions noted above, I reevaluated the patient and found that they have :improved   Social Determinants of Health:  Lives at home with his wife   Dispostion:  After consideration of the diagnostic results and the patients response to treatment, I feel that the patent would benefit from discharge with outpatient f/u.          Final Clinical Impression(s) / ED Diagnoses Final diagnoses:  Opiate abuse, episodic (HCC)  Chest wall pain    Rx / DC Orders ED Discharge Orders          Ordered    predniSONE (DELTASONE) 50 MG tablet  Daily with breakfast        01/15/22 2258    ibuprofen (ADVIL) 600 MG tablet  Every 6 hours PRN        01/15/22 2258              Jacalyn Lefevre, MD 01/15/22 2300

## 2022-01-15 NOTE — ED Triage Notes (Signed)
Pt BIB [facility] from home. Pt admits to fentanyl OD earlier today where wife gave him narcan. Pt also states he OD yesterday as well and was given narcan and CPR by wife, no EMS called.   Today pt is c/o congestion, SOB and chest pain. EMS states left side lung sounds diminished.

## 2022-01-28 ENCOUNTER — Other Ambulatory Visit: Payer: Self-pay

## 2022-02-06 ENCOUNTER — Other Ambulatory Visit: Payer: Self-pay

## 2022-02-06 ENCOUNTER — Emergency Department (HOSPITAL_COMMUNITY)
Admission: EM | Admit: 2022-02-06 | Discharge: 2022-02-07 | Disposition: A | Discharge status: Home / Self Care | Payer: Self-pay | Attending: Emergency Medicine | Admitting: Emergency Medicine

## 2022-02-06 ENCOUNTER — Emergency Department (HOSPITAL_COMMUNITY): Payer: Self-pay

## 2022-02-06 ENCOUNTER — Encounter (HOSPITAL_COMMUNITY): Payer: Self-pay | Admitting: Emergency Medicine

## 2022-02-06 DIAGNOSIS — X500XXA Overexertion from strenuous movement or load, initial encounter: Secondary | ICD-10-CM | POA: Insufficient documentation

## 2022-02-06 DIAGNOSIS — Y990 Civilian activity done for income or pay: Secondary | ICD-10-CM | POA: Insufficient documentation

## 2022-02-06 DIAGNOSIS — S29011A Strain of muscle and tendon of front wall of thorax, initial encounter: Secondary | ICD-10-CM | POA: Insufficient documentation

## 2022-02-06 DIAGNOSIS — J449 Chronic obstructive pulmonary disease, unspecified: Secondary | ICD-10-CM | POA: Insufficient documentation

## 2022-02-06 DIAGNOSIS — Z7982 Long term (current) use of aspirin: Secondary | ICD-10-CM | POA: Insufficient documentation

## 2022-02-06 NOTE — ED Triage Notes (Signed)
Pt states he heard a "pop" in his R chest this morning while he was at work. States it hurts in his chest when he tries to move R arm and when he coughs.

## 2022-02-07 MED ORDER — LIDOCAINE 5 % EX PTCH: 1.0000 | MEDICATED_PATCH | CUTANEOUS | 0 refills | Status: DC

## 2022-02-07 MED ORDER — KETOROLAC TROMETHAMINE 30 MG/ML IJ SOLN
30.0000 mg | Freq: Once | INTRAMUSCULAR | Status: AC
  Administered 2022-02-07: 30 mg via INTRAMUSCULAR
  Filled 2022-02-07: qty 1

## 2022-02-07 MED ORDER — NAPROXEN 500 MG PO TABS: 500.0000 mg | ORAL_TABLET | Freq: Two times a day (BID) | ORAL | 0 refills | Status: DC

## 2022-02-07 NOTE — ED Notes (Signed)
ED Provider at bedside. 

## 2022-02-07 NOTE — ED Provider Notes (Signed)
[facility] EMERGENCY DEPARTMENT  Provider Note  CSN: [id number] Arrival date & time: 02/06/22 2334  History Chief Complaint  Patient presents with   Chest Pain    Franklin Anderson is a 51 y.o. 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 He has had significant pain since then, worse with deep breath and with moving his R arm. He denies any changes in his breathing. He was in the ED for similar symptoms a few weeks ago after an opiate overdose and bystander CPR, the pain from that episode had resolved prior to today.    Home Medications Prior to Admission medications   Medication Sig Start Date End Date Taking? Authorizing Provider  naproxen (NAPROSYN) 500 MG tablet Take 1 tablet (500 mg total) by mouth 2 (two) times daily. 02/07/22  Yes Pollyann Savoy, MD  acetaminophen (TYLENOL) 325 MG tablet Take 2 tablets (650 mg total) by mouth every 6 (six) hours as needed for mild pain (or Fever >/= 101). 04/14/19   Sheth, Caren Macadam, MD  Aspirin-Salicylamide-Caffeine (BC HEADACHE POWDER PO) Take by mouth.    [provider]  busPIRone (BUSPAR) 10 MG tablet Take 1 tablet (10 mg total) by mouth 2 (two) times daily. Patient not taking: Reported on 01/15/2022 01/08/22   Anders Simmonds, PA-C  hydrOXYzine (ATARAX) 25 MG tablet Take 25 mg by mouth 3 (three) times daily as needed for anxiety.    [provider]  lidocaine (LIDODERM) 5 % Place 1 patch onto the skin daily. Remove & Discard patch within 12 hours or as directed by MD 02/07/22   Pollyann Savoy, MD  losartan (COZAAR) 50 MG tablet Take 1 tablet (50 mg total) by mouth daily. 01/08/22   Anders Simmonds, PA-C  metoprolol tartrate (LOPRESSOR) 50 MG tablet Take 1 tablet (50 mg total) by mouth 2 (two) times daily. 12/23/21   Mayers, Cari S, PA-C  traZODone (DESYREL) 50 MG tablet Take 0.5-1 tablets (25-50 mg total)  by mouth at bedtime as needed for sleep. Patient not taking: Reported on 01/15/2022 01/08/22   Anders Simmonds, PA-C     Allergies    Sulfa antibiotics and Penicillins   Review of Systems   Review of Systems Please see HPI for pertinent positives and negatives  Physical Exam BP (!) 177/115   Pulse 96   Temp 98 F (36.7 C) (Oral)   Resp 18   Ht 6' (1.829 m)   Wt 59 kg   SpO2 98%   BMI 17.64 kg/m   Physical Exam Vitals and nursing note reviewed.  Constitutional:      Appearance: Normal appearance.  HENT:     Head: Normocephalic and atraumatic.     Nose: Nose normal.     Mouth/Throat:     Mouth: Mucous membranes are moist.  Eyes:     Extraocular Movements: Extraocular movements intact.     Conjunctiva/sclera: Conjunctivae normal.  Cardiovascular:     Rate and Rhythm: Normal rate.  Pulmonary:     Effort: Pulmonary effort is normal.     Breath sounds: Rhonchi present.  Chest:     Chest wall: Tenderness (R upper pectoral area) present.  Abdominal:     General: Abdomen is flat.     Palpations: Abdomen is soft.     Tenderness: There is no abdominal tenderness.  Musculoskeletal:        General: No swelling or deformity.     Cervical back: Neck supple.     Comments: Pain with abduction of R arm  Skin:    General: Skin is warm and dry.  Neurological:     General: No focal deficit present.     Mental Status: He is alert.  Psychiatric:        Mood and Affect: Mood normal.     ED Results / Procedures / Treatments   EKG EKG Interpretation  Date/Time:  Saturday February 07 2022 00:31:39 EDT Ventricular Rate:  74 PR Interval:  144 QRS Duration: 70 QT Interval:  371 QTC Calculation: 412 R Axis:   69 Text Interpretation: Sinus rhythm Biatrial enlargement Left ventricular hypertrophy ST elev, probable normal early repol pattern No significant change since last tracing Confirmed by Calvert Cantor [phone]) on 02/07/2022 1:58:06  AM  Procedures Procedures  Medications Ordered in the ED Medications  ketorolac (TORADOL) 30 MG/ML injection 30 mg (has no administration in time range)    Initial Impression and Plan  Patient with MSK chest/shoulder pain after lifting. Breath sounds are equal. I personally viewed the images from radiology studies and agree with radiologist interpretation: CXR without acute change or signs of PTX. Given recent opiate overdose, will avoid using any narcotics today. Plan toradol, sling for comfort, Rx for naprosyn and rest. Follow up with Ortho if not improving.    ED Course       MDM Rules/Calculators/A&P Medical Decision Making Problems Addressed: Pectoralis muscle strain, initial encounter: acute illness or injury  Amount and/or Complexity of Data Reviewed Radiology: ordered and independent interpretation performed. Decision-making details documented in ED Course. ECG/medicine tests: ordered and independent interpretation performed. Decision-making details documented in ED Course.  Risk Prescription drug management.    Final Clinical Impression(s) / ED Diagnoses Final diagnoses:  Pectoralis muscle strain, initial encounter    Rx / DC Orders ED Discharge Orders          Ordered    lidocaine (LIDODERM) 5 %  Every 24 hours        02/07/22 0203    naproxen (NAPROSYN) 500 MG tablet  2 times daily        02/07/22 0203             Truddie Hidden, MD 02/07/22 [phone]

## 2022-02-13 ENCOUNTER — Ambulatory Visit: Payer: Self-pay | Admitting: Pharmacist

## 2022-02-23 ENCOUNTER — Emergency Department (HOSPITAL_COMMUNITY): Payer: Self-pay

## 2022-02-23 ENCOUNTER — Other Ambulatory Visit: Payer: Self-pay

## 2022-02-23 ENCOUNTER — Inpatient Hospital Stay (HOSPITAL_COMMUNITY)
Admission: EM | Admit: 2022-02-23 | Discharge: 2022-03-05 | DRG: 064 | Disposition: A | Discharge status: Rehabilitation Facility | Payer: Self-pay | Attending: Family Medicine | Admitting: Family Medicine

## 2022-02-23 DIAGNOSIS — R29706 NIHSS score 6: Secondary | ICD-10-CM | POA: Diagnosis present

## 2022-02-23 DIAGNOSIS — F17210 Nicotine dependence, cigarettes, uncomplicated: Secondary | ICD-10-CM | POA: Diagnosis present

## 2022-02-23 DIAGNOSIS — F1410 Cocaine abuse, uncomplicated: Secondary | ICD-10-CM | POA: Diagnosis present

## 2022-02-23 DIAGNOSIS — M6282 Rhabdomyolysis: Secondary | ICD-10-CM | POA: Diagnosis present

## 2022-02-23 DIAGNOSIS — I10 Essential (primary) hypertension: Secondary | ICD-10-CM

## 2022-02-23 DIAGNOSIS — Y92003 Bedroom of unspecified non-institutional (private) residence as the place of occurrence of the external cause: Secondary | ICD-10-CM

## 2022-02-23 DIAGNOSIS — E785 Hyperlipidemia, unspecified: Secondary | ICD-10-CM | POA: Diagnosis present

## 2022-02-23 DIAGNOSIS — N170 Acute kidney failure with tubular necrosis: Secondary | ICD-10-CM | POA: Diagnosis present

## 2022-02-23 DIAGNOSIS — Z8042 Family history of malignant neoplasm of prostate: Secondary | ICD-10-CM

## 2022-02-23 DIAGNOSIS — G9341 Metabolic encephalopathy: Secondary | ICD-10-CM | POA: Insufficient documentation

## 2022-02-23 DIAGNOSIS — R2990 Unspecified symptoms and signs involving the nervous system: Secondary | ICD-10-CM

## 2022-02-23 DIAGNOSIS — F1910 Other psychoactive substance abuse, uncomplicated: Secondary | ICD-10-CM

## 2022-02-23 DIAGNOSIS — E872 Acidosis, unspecified: Secondary | ICD-10-CM | POA: Diagnosis present

## 2022-02-23 DIAGNOSIS — Z8673 Personal history of transient ischemic attack (TIA), and cerebral infarction without residual deficits: Secondary | ICD-10-CM

## 2022-02-23 DIAGNOSIS — E875 Hyperkalemia: Secondary | ICD-10-CM | POA: Diagnosis present

## 2022-02-23 DIAGNOSIS — Z880 Allergy status to penicillin: Secondary | ICD-10-CM

## 2022-02-23 DIAGNOSIS — Z8249 Family history of ischemic heart disease and other diseases of the circulatory system: Secondary | ICD-10-CM

## 2022-02-23 DIAGNOSIS — R4182 Altered mental status, unspecified: Secondary | ICD-10-CM | POA: Diagnosis present

## 2022-02-23 DIAGNOSIS — L89899 Pressure ulcer of other site, unspecified stage: Secondary | ICD-10-CM | POA: Diagnosis present

## 2022-02-23 DIAGNOSIS — N5089 Other specified disorders of the male genital organs: Secondary | ICD-10-CM | POA: Diagnosis present

## 2022-02-23 DIAGNOSIS — I639 Cerebral infarction, unspecified: Secondary | ICD-10-CM | POA: Insufficient documentation

## 2022-02-23 DIAGNOSIS — Z882 Allergy status to sulfonamides status: Secondary | ICD-10-CM

## 2022-02-23 DIAGNOSIS — Z825 Family history of asthma and other chronic lower respiratory diseases: Secondary | ICD-10-CM

## 2022-02-23 DIAGNOSIS — Z807 Family history of other malignant neoplasms of lymphoid, hematopoietic and related tissues: Secondary | ICD-10-CM

## 2022-02-23 DIAGNOSIS — M609 Myositis, unspecified: Secondary | ICD-10-CM | POA: Diagnosis present

## 2022-02-23 DIAGNOSIS — I63442 Cerebral infarction due to embolism of left cerebellar artery: Principal | ICD-10-CM | POA: Diagnosis present

## 2022-02-23 DIAGNOSIS — R54 Age-related physical debility: Secondary | ICD-10-CM | POA: Diagnosis present

## 2022-02-23 DIAGNOSIS — W06XXXA Fall from bed, initial encounter: Secondary | ICD-10-CM | POA: Diagnosis present

## 2022-02-23 DIAGNOSIS — F32A Depression, unspecified: Secondary | ICD-10-CM | POA: Diagnosis present

## 2022-02-23 DIAGNOSIS — N179 Acute kidney failure, unspecified: Secondary | ICD-10-CM | POA: Diagnosis present

## 2022-02-23 DIAGNOSIS — G928 Other toxic encephalopathy: Secondary | ICD-10-CM | POA: Diagnosis present

## 2022-02-23 LAB — URINALYSIS, ROUTINE W REFLEX MICROSCOPIC
Bilirubin Urine: NEGATIVE
Glucose, UA: NEGATIVE mg/dL
Ketones, ur: NEGATIVE mg/dL
Leukocytes,Ua: NEGATIVE
Nitrite: NEGATIVE
Protein, ur: 100 mg/dL — AB
Specific Gravity, Urine: 1.015 (ref 1.005–1.030)
pH: 5 (ref 5.0–8.0)

## 2022-02-23 LAB — RAPID URINE DRUG SCREEN, HOSP PERFORMED
Amphetamines: POSITIVE — AB
Barbiturates: NOT DETECTED
Benzodiazepines: POSITIVE — AB
Cocaine: POSITIVE — AB
Opiates: NOT DETECTED
Tetrahydrocannabinol: NOT DETECTED

## 2022-02-23 LAB — COMPREHENSIVE METABOLIC PANEL
ALT: 700 U/L — ABNORMAL HIGH (ref 0–44)
AST: 1214 U/L — ABNORMAL HIGH (ref 15–41)
Albumin: 4.2 g/dL (ref 3.5–5.0)
Alkaline Phosphatase: 101 U/L (ref 38–126)
Anion gap: 17 — ABNORMAL HIGH (ref 5–15)
BUN: 56 mg/dL — ABNORMAL HIGH (ref 6–20)
CO2: 22 mmol/L (ref 22–32)
Calcium: 7 mg/dL — ABNORMAL LOW (ref 8.9–10.3)
Chloride: 98 mmol/L (ref 98–111)
Creatinine, Ser: 3.33 mg/dL — ABNORMAL HIGH (ref 0.61–1.24)
GFR, Estimated: 22 mL/min — ABNORMAL LOW (ref >60)
Glucose, Bld: 145 mg/dL — ABNORMAL HIGH (ref 70–99)
Potassium: 7.1 mmol/L (ref 3.5–5.1)
Sodium: 137 mmol/L (ref 135–145)
Total Bilirubin: 0.4 mg/dL (ref 0.3–1.2)
Total Protein: 7.6 g/dL (ref 6.5–8.1)

## 2022-02-23 LAB — I-STAT CHEM 8, ED
BUN: 68 mg/dL — ABNORMAL HIGH (ref 6–20)
BUN: 54 mg/dL — ABNORMAL HIGH (ref 6–20)
Calcium, Ion: 0.81 mmol/L — CL (ref 1.15–1.40)
Calcium, Ion: 0.83 mmol/L — CL (ref 1.15–1.40)
Chloride: 101 mmol/L (ref 98–111)
Chloride: 106 mmol/L (ref 98–111)
Creatinine, Ser: 3.4 mg/dL — ABNORMAL HIGH (ref 0.61–1.24)
Creatinine, Ser: 3.4 mg/dL — ABNORMAL HIGH (ref 0.61–1.24)
Glucose, Bld: 139 mg/dL — ABNORMAL HIGH (ref 70–99)
Glucose, Bld: 90 mg/dL (ref 70–99)
HCT: 53 % — ABNORMAL HIGH (ref 39.0–52.0)
HCT: 45 % (ref 39.0–52.0)
Hemoglobin: 18 g/dL — ABNORMAL HIGH (ref 13.0–17.0)
Hemoglobin: 15.3 g/dL (ref 13.0–17.0)
Potassium: 7.1 mmol/L (ref 3.5–5.1)
Potassium: 5.2 mmol/L — ABNORMAL HIGH (ref 3.5–5.1)
Sodium: 137 mmol/L (ref 135–145)
Sodium: 139 mmol/L (ref 135–145)
TCO2: 25 mmol/L (ref 22–32)
TCO2: 21 mmol/L — ABNORMAL LOW (ref 22–32)

## 2022-02-23 LAB — DIFFERENTIAL
Abs Immature Granulocytes: 0.09 10*3/uL — ABNORMAL HIGH (ref 0.00–0.07)
Basophils Absolute: 0 10*3/uL (ref 0.0–0.1)
Basophils Relative: 0 %
Eosinophils Absolute: 0 10*3/uL (ref 0.0–0.5)
Eosinophils Relative: 0 %
Immature Granulocytes: 1 %
Lymphocytes Relative: 6 %
Lymphs Abs: 1 10*3/uL (ref 0.7–4.0)
Monocytes Absolute: 1 10*3/uL (ref 0.1–1.0)
Monocytes Relative: 6 %
Neutro Abs: 14.3 10*3/uL — ABNORMAL HIGH (ref 1.7–7.7)
Neutrophils Relative %: 87 %

## 2022-02-23 LAB — CBC
HCT: 53.1 % — ABNORMAL HIGH (ref 39.0–52.0)
Hemoglobin: 17.3 g/dL — ABNORMAL HIGH (ref 13.0–17.0)
MCH: 31.9 pg (ref 26.0–34.0)
MCHC: 32.6 g/dL (ref 30.0–36.0)
MCV: 97.8 fL (ref 80.0–100.0)
Platelets: 349 10*3/uL (ref 150–400)
RBC: 5.43 MIL/uL (ref 4.22–5.81)
RDW: 12.5 % (ref 11.5–15.5)
WBC: 16.4 10*3/uL — ABNORMAL HIGH (ref 4.0–10.5)
nRBC: 0 % (ref 0.0–0.2)

## 2022-02-23 LAB — PROTIME-INR
INR: 1.2 (ref 0.8–1.2)
Prothrombin Time: 14.8 seconds (ref 11.4–15.2)

## 2022-02-23 LAB — ETHANOL: Alcohol, Ethyl (B): <10 mg/dL (ref <10)

## 2022-02-23 LAB — APTT: aPTT: 43 seconds — ABNORMAL HIGH (ref 24–36)

## 2022-02-23 LAB — CK: Total CK: 43333 U/L — ABNORMAL HIGH (ref 49–397)

## 2022-02-23 LAB — CBG MONITORING, ED: Glucose-Capillary: 142 mg/dL — ABNORMAL HIGH (ref 70–99)

## 2022-02-23 MED ORDER — SODIUM ZIRCONIUM CYCLOSILICATE 10 G PO PACK
10.0000 g | PACK | Freq: Once | ORAL | Status: DC
  Filled 2022-02-23: qty 1

## 2022-02-23 MED ORDER — SODIUM CHLORIDE 0.9 % IV BOLUS
500.0000 mL | Freq: Once | INTRAVENOUS | Status: AC
  Administered 2022-02-23: 500 mL via INTRAVENOUS

## 2022-02-23 MED ORDER — INSULIN ASPART 100 UNIT/ML IV SOLN
10.0000 [IU] | Freq: Once | INTRAVENOUS | Status: AC
  Administered 2022-02-23: 10 [IU] via INTRAVENOUS

## 2022-02-23 MED ORDER — SODIUM CHLORIDE 0.9 % IV SOLN: Freq: Once | INTRAVENOUS | Status: AC

## 2022-02-23 MED ORDER — CALCIUM GLUCONATE 10 % IV SOLN
1.0000 g | Freq: Once | INTRAVENOUS | Status: AC
  Administered 2022-02-23: 1 g via INTRAVENOUS
  Filled 2022-02-23: qty 10

## 2022-02-23 MED ORDER — SODIUM CHLORIDE 0.9 % IV BOLUS
2000.0000 mL | Freq: Once | INTRAVENOUS | Status: AC
  Administered 2022-02-23: 2000 mL via INTRAVENOUS

## 2022-02-23 MED ORDER — DEXTROSE 50 % IV SOLN
1.0000 | Freq: Once | INTRAVENOUS | Status: AC
  Administered 2022-02-23: 50 mL via INTRAVENOUS
  Filled 2022-02-23: qty 50

## 2022-02-23 NOTE — ED Notes (Signed)
Dr.Countryman aware of critical chem-8 results.

## 2022-02-23 NOTE — ED Provider Notes (Signed)
MSE was initiated and I personally evaluated the patient and placed orders (if any) at  8:13 PM on February 23, 2022.  The patient appears stable so that the remainder of the MSE may be completed by another provider.  Patient is here with code stroke.  Patient apparently last normal was around 2 PM.  Patient is a known opiate abuser.  He was noted to be altered and confused and questionable slurred speech and right-sided weakness.  I was able to place left antecubital 18-gauge IV under ultrasound.  Dr. Rory Percy at bedside and recommend CTA head and neck and MRI brain.  Dr. Oswald Hillock to assume care  Angiocath insertion Performed by: Wandra Arthurs  Consent: Verbal consent obtained. Risks and benefits: risks, benefits and alternatives were discussed Time out: Immediately prior to procedure a "time out" was called to verify the correct patient, procedure, equipment, support staff and site/side marked as required.  Preparation: Patient was prepped and draped in the usual sterile fashion.  Vein Location: L antecube  Ultrasound Guided  Gauge: 18 long   Normal blood return and flush without difficulty Patient tolerance: Patient tolerated the procedure well with no immediate complications.     Drenda Freeze, MD 02/23/22 2014

## 2022-02-23 NOTE — ED Notes (Signed)
Patients wife would like a update [phone]

## 2022-02-23 NOTE — Code Documentation (Signed)
Stroke Response Nurse Documentation Code Documentation  Franklin Anderson is a 51 y.o. male arriving to [facility]  via [facility] on 10/30 with past medical hx of IV drug use, HTN, rheumatic fever, endocarditis. On No antithrombotic. Code stroke was activated by EMS.   Patient from home where he was LKW at 1400 and now complaining of right sided weakness.  Stroke team at the bedside on patient arrival. Labs drawn and patient cleared for CT by Dr. Darl Householder. Patient to CT with team. NIHSS 5, see documentation for details and code stroke times. Patient with right arm weakness, right leg weakness, and right decreased sensation on exam. The following imaging was completed:  CT Head. Patient is not a candidate for IV Thrombolytic due to Outside of window. Patient is not a candidate for IR due to low suspicion for LVO.     Bedside handoff with ED RN Kennyth Lose.    Madelynn Done  Rapid Response RN

## 2022-02-23 NOTE — ED Provider Notes (Signed)
[place] [facility] EMERGENCY DEPARTMENT Provider Note   CSN: [id number] Arrival date & time: 02/23/22  2005  An emergency department physician performed an initial assessment on this suspected stroke patient at 2006.  History Chief Complaint  Patient presents with   Code Stroke    HPI Franklin Anderson is a 51 y.o. male presenting for altered mental status.  Patient found down at home.  Per significant other at last known normal 12 today though the story was changing.  Patient was covered in fecal matter, collapsed on the floor when EMS arrived.  Patient has a history of substance use disorder, overdose in the past.  Patient is confused endorsing right-sided severe pain and weakness.  Seen for similar pain 2 weeks ago though the weakness appears to be worsening.  Called in as a code stroke per EMS.  Patient's family arrived later in the evaluation and endorse that he has been able to ambulate as of yesterday, and that he fell earlier today.  He cannot give an exact time for last known normal. Patient's recorded medical, surgical, social, medication list and allergies were reviewed in the Snapshot window as part of the initial history.   Review of Systems   Review of Systems  Constitutional:  Negative for chills and fever.  HENT:  Negative for ear pain and sore throat.   Eyes:  Negative for pain and visual disturbance.  Respiratory:  Negative for cough and shortness of breath.   Cardiovascular:  Negative for chest pain and palpitations.  Gastrointestinal:  Negative for abdominal pain and vomiting.  Genitourinary:  Negative for dysuria and hematuria.  Musculoskeletal:  Negative for arthralgias and back pain.  Skin:  Negative for color change and rash.  Neurological:  Positive for weakness. Negative for seizures and syncope.  Psychiatric/Behavioral:  Positive for confusion.   All other systems reviewed and are negative.   Physical Exam Updated Vital Signs BP (!) 153/103    Pulse (!) 106   Temp 100.1 F (37.8 C) (Rectal)   Resp 16   Ht 6' (1.829 m)   Wt 56.7 kg   SpO2 93%   BMI 16.95 kg/m  Physical Exam Vitals and nursing note reviewed.  Constitutional:      General: He is not in acute distress.    Appearance: He is well-developed.  HENT:     Head: Normocephalic and atraumatic.  Eyes:     Conjunctiva/sclera: Conjunctivae normal.  Cardiovascular:     Rate and Rhythm: Normal rate and regular rhythm.     Heart sounds: No murmur heard. Pulmonary:     Effort: Pulmonary effort is normal. No respiratory distress.     Breath sounds: Normal breath sounds.  Abdominal:     Palpations: Abdomen is soft.     Tenderness: There is no abdominal tenderness.  Musculoskeletal:        General: Tenderness (Tenderness to palpation with movement of the right side.  No obvious deformity.) present. No swelling.     Cervical back: Neck supple.  Skin:    General: Skin is warm and dry.     Capillary Refill: Capillary refill takes less than 2 seconds.  Neurological:     Mental Status: He is alert.  Psychiatric:        Mood and Affect: Mood normal.      ED Course/ Medical Decision Making/ A&P Clinical Course as of 02/23/22 2315  Mon Feb 23, 2022  2044 CC for hyperkalemia and code stroke [CC]  Clinical Course User Index [CC] Tretha Sciara, MD    Procedures .Critical Care  Performed by: Tretha Sciara, MD Authorized by: Tretha Sciara, MD   Critical care provider statement:    Critical care time (minutes):  30   Critical care was necessary to treat or prevent imminent or life-threatening deterioration of the following conditions:  Metabolic crisis and CNS failure or compromise   Critical care was time spent personally by me on the following activities:  Development of treatment plan with patient or surrogate, discussions with consultants, evaluation of patient's response to treatment, examination of patient, ordering and review of laboratory  studies, ordering and review of radiographic studies, ordering and performing treatments and interventions, pulse oximetry, re-evaluation of patient's condition and review of old charts    Medications Ordered in ED Medications  sodium zirconium cyclosilicate (LOKELMA) packet 10 g (0 g Oral Hold 02/23/22 2045)  0.9 %  sodium chloride infusion (has no administration in time range)  sodium chloride 0.9 % bolus 500 mL (0 mLs Intravenous Stopped 02/23/22 2257)  calcium gluconate inj 10% (1 g) URGENT USE ONLY! (1 g Intravenous Given 02/23/22 2050)  insulin aspart (novoLOG) injection 10 Units (10 Units Intravenous Given 02/23/22 2051)    And  dextrose 50 % solution 50 mL (50 mLs Intravenous Given 02/23/22 2051)  sodium chloride 0.9 % bolus 2,000 mL (0 mLs Intravenous Stopped 02/23/22 2215)    Medical Decision Making:    Franklin Anderson is a 51 y.o. male who presented to the ED today with multiple abnormalities detailed above.     Patient's presentation is complicated by their history of multiple comorbid medical problems.  Patient placed on continuous vitals and telemetry monitoring while in ED which was reviewed periodically.   Complete initial physical exam performed, notably the patient  was hemodynamically stable in no acute distress.      Reviewed and confirmed nursing documentation for past medical history, family history, social history.    Initial Assessment:   With the patient's presentation of right-sided pain and weakness, most likely diagnosis is traumatic injury after his fall from bed. This is most consistent with an acute life/limb threatening illness complicated by underlying chronic conditions. His syndrome raises concern for developing rhabdomyolysis as patient was left down for an uncertain amount of time. Initial Plan:  X-ray of the pelvis to evaluate for underlying fracture Screening labs including CBC and Metabolic panel to evaluate for infectious or metabolic etiology  of disease.  Urinalysis with reflex culture ordered to evaluate for UTI or relevant urologic/nephrologic pathology.  CXR to evaluate for structural/infectious intrathoracic pathology.  EKG to evaluate for cardiac pathology. Objective evaluation as below reviewed with plan for close reassessment  Initial Study Results:   Laboratory  Emergent pathology appreciated with a hyperkalemia.  Hyperkalemia treatment protocol activated as there are EKG changes.  Consulted with critical care who felt comfortable with floor level admission.  Consult placed to nephrology.  Patient immediately treated with IM insulin, dextrose, Lokelma, 2 L normal saline and calcium gluconate for cardiac stabilization  EKG EKG was reviewed independently. Rate, rhythm, axis, intervals all examined and without medically relevant abnormality. ST segments without concerns for elevations.    Radiology  All images reviewed independently. Agree with radiology report at this time.   DG Pelvis 1-2 Views  Result Date: 02/23/2022 CLINICAL DATA:  Fall. EXAM: PELVIS - 1-2 VIEW COMPARISON:  None Available. FINDINGS: There is no evidence of pelvic fracture or diastasis. No pelvic bone lesions  are seen. IMPRESSION: Negative. Electronically Signed   By: Ronney Asters M.D.   On: 02/23/2022 21:41   DG Chest Portable 1 View  Result Date: 02/23/2022 CLINICAL DATA:  Fall EXAM: PORTABLE CHEST 1 VIEW COMPARISON:  02/06/2022 FINDINGS: Heart and mediastinal contours are within normal limits. No focal opacities or effusions. No acute bony abnormality. IMPRESSION: No active disease. Electronically Signed   By: Rolm Baptise M.D.   On: 02/23/2022 21:11   CT HEAD CODE STROKE WO CONTRAST  Result Date: 02/23/2022 CLINICAL DATA:  Code stroke. Initial evaluation for stroke, right-sided deficits. EXAM: CT HEAD WITHOUT CONTRAST TECHNIQUE: Contiguous axial images were obtained from the base of the skull through the vertex without intravenous contrast.  RADIATION DOSE REDUCTION: This exam was performed according to the departmental dose-optimization program which includes automated exposure control, adjustment of the mA and/or kV according to patient size and/or use of iterative reconstruction technique. COMPARISON:  Prior CT from 02/22/2019. FINDINGS: Brain: Cerebral volume within normal limits for age. 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 No acute large vessel territory infarct. No acute intracranial hemorrhage. No mass lesion or midline shift. No hydrocephalus or extra-axial fluid collection. Vascular: No asymmetric hyperdense vessel. Skull: Focal soft tissue thickening/swelling present at the central forehead, indeterminate (series 5, image 22). Calvarium intact. Sinuses/Orbits: Globes orbital soft tissues within normal limits. Paranasal sinuses and mastoid air cells are largely clear. Other: None. ASPECTS [facility] Stroke Program Early CT Score) - Ganglionic level infarction (caudate, lentiform nuclei, internal capsule, insula, M1-M3 cortex): 7 - Supraganglionic infarction (M4-M6 cortex): 3 Total score (0-10 with 10 being normal): 10 IMPRESSION: 1. No acute intracranial abnormality. 2. ASPECTS is 10. 3. 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 4. Focal soft tissue thickening and/or swelling at the central forehead. Correlation with physical exam recommended. These results were communicated to Dr. Rory Percy at 8:42 pm on 02/23/2022 by text page via the [facility] messaging system. Electronically Signed   By: Jeannine Boga M.D.   On: 02/23/2022 20:45     Consults:  Case discussed with neurology, critical care.   Final Assessment and Plan:   Given that there is substantial hyperkalemia, we have treated patient with improvement of his hyperkalemia.  Critical care evaluated patient and feels comfortable that patient would be successful  on the floor.  Consulted hospitalist who will admit patient for developing rhabdomyolysis with patient on IV fluid.  Patient does have right hip pain but is tolerating passive range of motion.  He has a leukocytosis without an active fever.  He will need reassessment if his pain continues or if he develops worsening symptoms despite IV fluids and treatment is rhabdo.  If symptoms are worsening, he may have an early developing arthritis may need evaluation for septic arthritis in the a.m. if symptoms are continuing/progressive.   Disposition:   Based on the above findings, I believe this patient is stable for admission.    Patient/family educated about specific findings on our evaluation and explained exact reasons for admission.  Patient/family educated about clinical situation and time was allowed to answer questions.   Admission team communicated with and agreed with need for admission. Patient admitted. Patient ready to move at this time.     Emergency Department Medication Summary:   Medications  sodium zirconium cyclosilicate (LOKELMA) packet 10 g (0 g Oral Hold 02/23/22 2045)  0.9 %  sodium chloride infusion (has no administration in time range)  sodium chloride 0.9 % bolus 500 mL (0 mLs Intravenous Stopped 02/23/22 2257)  calcium gluconate inj 10% (1 g) URGENT USE ONLY! (1 g Intravenous Given 02/23/22 2050)  insulin aspart (novoLOG) injection 10 Units (10 Units Intravenous Given 02/23/22 2051)    And  dextrose 50 % solution 50 mL (50 mLs Intravenous Given 02/23/22 2051)  sodium chloride 0.9 % bolus 2,000 mL (0 mLs Intravenous Stopped 02/23/22 2215)         Clinical Impression:  1. Non-traumatic rhabdomyolysis      Admit   Final Clinical Impression(s) / ED Diagnoses Final diagnoses:  Non-traumatic rhabdomyolysis    Rx / DC Orders ED Discharge Orders     None         Tretha Sciara, MD 02/23/22 2315

## 2022-02-23 NOTE — ED Notes (Signed)
[phone] BROTHER

## 2022-02-23 NOTE — ED Triage Notes (Signed)
Pt arrived by EMS, EMS called a code stroke due to right sided weakness.   Pt reports going to take a nap around 1400 today and when he woke up he was very weak and tried to get out of bed and slide onto ground. Pt states he tried for several hours to get up and call for help. Pt covered in stool and has multiple pressure looking wounds on knees, hip and shin.   Pt is alert on arrival, complaining of feeling tired and having right hip and right chest pain  Per MD pt taken to room to be cleaned prior to CT scan

## 2022-02-23 NOTE — ED Notes (Signed)
Critical lab value 7.1K+ reported to EDP

## 2022-02-23 NOTE — Consult Note (Addendum)
Neurology Consultation  Reason for Consult: right sided weakness, code stroke Referring Physician: Dr Silverio Lay  CC: Right-sided weakness/code stroke initiated by EMS in the field.  History is obtained from: Chart, patient  HPI: Franklin Anderson is a 51 y.o. male past medical history of drug abuse, hypertension brought in by EMS for evaluation of sudden onset of right-sided weakness.  He reports that he had pulled a muscle in his pectoral muscles have been hurting for 2 weeks.  This morning, he was normal when he woke up, went to take a nap at 1400 hrs. which was his last known well.  Wakes up at 1500 hrs. with some weakness on the right side.  He also was unable to get out of the bed.  When EMS arrived, he had soiled himself but stools and was laying in them in bed somewhat slid down. They activated a code stroke. Patient was seen at the ER bridge. Preliminary exam negative for large vessel occlusion. Slight delay in obtaining CT due to having the need to clean them up because of heavy swelling from his own stools. Denied drug abuse for the last few weeks.   Further history taking from the patient reveals that he has been feeling unwell for the past 2 days.  He has been feeling weak all over but the right-sided weakness is somewhat new.  He could not give a clear timeline of the right-sided weakness.  He has a lot of pain in the right upper and lower extremity which is also making it difficult for him to move the right upper and lower extremity at this time.  LKW: 1400 hrs. at best-may be 2 days ago-difficult to ascertain IV thrombolysis given?: no, arrived outside the window Premorbid modified Rankin scale (mRS): 1  ROS: Full ROS was performed and is negative except as noted in the HPI.   Past Medical History:  Diagnosis Date   Drug abuse (HCC)    H/O: rheumatic fever    Diagnosed at 51 y/o.  Resolved at 51 y/o.  States he was diagnosed with heart murmur felt due to this   Hypertension 2012      Family History  Problem Relation Age of Onset   Cancer Father        Prostate cancer--cause of death   Coronary artery disease Father        6 CABG after MI   COPD Mother    Lymphoma Mother 43       Unknown type   Hypertension Mother    Aneurysm Brother 14       Brain-cause of death   Hypertension Brother      Social History:   reports that he has been smoking cigarettes. He has a 24.00 pack-year smoking history. He has been exposed to tobacco smoke. He has quit using smokeless tobacco.  His smokeless tobacco use included chew. He reports that he does not currently use drugs after having used the following drugs: IV. He reports that he does not drink alcohol.  Medications  Current Facility-Administered Medications:    methylPREDNISolone acetate (DEPO-MEDROL) injection 40 mg, 40 mg, Intramuscular, Once, Mayers, Cari S, PA-C   sodium zirconium cyclosilicate (LOKELMA) packet 10 g, 10 g, Oral, Once, Countryman, Chase, MD  Current Outpatient Medications:    acetaminophen (TYLENOL) 325 MG tablet, Take 2 tablets (650 mg total) by mouth every 6 (six) hours as needed for mild pain (or Fever >/= 101)., Disp: 90 tablet, Rfl: 0   Aspirin-Salicylamide-Caffeine (BC  HEADACHE POWDER PO), Take by mouth., Disp: , Rfl:    busPIRone (BUSPAR) 10 MG tablet, Take 1 tablet (10 mg total) by mouth 2 (two) times daily. (Patient not taking: Reported on 01/15/2022), Disp: 60 tablet, Rfl: 2   hydrOXYzine (ATARAX) 25 MG tablet, Take 25 mg by mouth 3 (three) times daily as needed for anxiety., Disp: , Rfl:    lidocaine (LIDODERM) 5 %, Place 1 patch onto the skin daily. Remove & Discard patch within 12 hours or as directed by MD, Disp: 30 patch, Rfl: 0   losartan (COZAAR) 50 MG tablet, Take 1 tablet (50 mg total) by mouth daily., Disp: 90 tablet, Rfl: 1   metoprolol tartrate (LOPRESSOR) 50 MG tablet, Take 1 tablet (50 mg total) by mouth 2 (two) times daily., Disp: 60 tablet, Rfl: 1   naproxen (NAPROSYN) 500  MG tablet, Take 1 tablet (500 mg total) by mouth 2 (two) times daily., Disp: 30 tablet, Rfl: 0   traZODone (DESYREL) 50 MG tablet, Take 0.5-1 tablets (25-50 mg total) by mouth at bedtime as needed for sleep. (Patient not taking: Reported on 01/15/2022), Disp: 30 tablet, Rfl: 3   Exam: Current vital signs: BP (!) 129/98   Pulse 97   Temp 100.1 F (37.8 C) (Rectal)   Resp 16   Ht 6' (1.829 m)   Wt 56.7 kg   SpO2 98%   BMI 16.95 kg/m  Vital signs in last 24 hours: Temp:  [100.1 F (37.8 C)] 100.1 F (37.8 C) (10/30 2044) Pulse Rate:  [97] 97 (10/30 2040) Resp:  [16] 16 (10/30 2040) BP: (129)/(98) 129/98 (10/30 2040) SpO2:  [98 %] 98 % (10/30 2040) Weight:  [56.7 kg] 56.7 kg (10/30 2046)   General: He is awake alert in distress due to pain in the right chest. HEENT: Normocephalic atraumatic Lungs: Clear Cardiovascular: Regular rhythm Abdomen nondistended nontender Extremities with multiple pressure ulcers/wounds.  Palpable right hip tenderness and right shoulder and pectoral region tenderness. Neurological exam He is awake alert oriented x3 Speech is mildly dysarthric No evidence of aphasia Cranial nerves: Pupils equal round react light, extraocular movements intact, visual fields full, face appears symmetric, facial sensation intact, tongue and palate midline. Motor examination with limited movement of the right arm at the shoulder mostly limited by pain with vertical drift.  No drift in the left upper extremity.  Right lower extremity is barely antigravity.  Left lower extremity with normal movement Sensation intact Coordination unable to check on the right, intact on the left NIHSS 1a Level of Conscious.: 0 1b LOC Questions: 0 1c LOC Commands: 0 2 Best Gaze: 0 3 Visual: 0 4 Facial Palsy: 0 5a Motor Arm - left: 0 5b Motor Arm - Right: 2 6a Motor Leg - Left: 0 6b Motor Leg - Right: 3 7 Limb Ataxia: 0 8 Sensory: 0 9 Best Language: 0 10 Dysarthria: 1 11 Extinct. and  Inatten.: 0 TOTAL: 6   Labs I have reviewed labs in epic and the results pertinent to this consultation are:  CBC    Component Value Date/Time   WBC 16.4 (H) 02/23/2022 2008   RBC 5.43 02/23/2022 2008   HGB 18.0 (H) 02/23/2022 2016   HGB 14.3 01/01/2022 1536   HCT 53.0 (H) 02/23/2022 2016   HCT 41.4 01/01/2022 1536   PLT 349 02/23/2022 2008   PLT 237 01/01/2022 1536   MCV 97.8 02/23/2022 2008   MCV 94 01/01/2022 1536   MCH 31.9 02/23/2022 2008   MCHC  32.6 02/23/2022 2008   RDW 12.5 02/23/2022 2008   RDW 12.1 01/01/2022 1536   LYMPHSABS 1.0 02/23/2022 2008   LYMPHSABS 2.2 01/01/2022 1536   MONOABS 1.0 02/23/2022 2008   EOSABS 0.0 02/23/2022 2008   EOSABS 0.2 01/01/2022 1536   BASOSABS 0.0 02/23/2022 2008   BASOSABS 0.0 01/01/2022 1536    CMP     Component Value Date/Time   NA 137 02/23/2022 2016   NA 143 01/01/2022 1536   K 7.1 (HH) 02/23/2022 2016   CL 101 02/23/2022 2016   CO2 22 02/23/2022 2008   GLUCOSE 139 (H) 02/23/2022 2016   BUN 68 (H) 02/23/2022 2016   BUN 22 01/01/2022 1536   CREATININE 3.40 (H) 02/23/2022 2016   CREATININE 0.47 (L) 05/03/2019 1216   CALCIUM 7.0 (L) 02/23/2022 2008   PROT 7.6 02/23/2022 2008   PROT 6.7 01/01/2022 1536   ALBUMIN 4.2 02/23/2022 2008   ALBUMIN 4.4 01/01/2022 1536   AST 1,214 (H) 02/23/2022 2008   ALT 700 (H) 02/23/2022 2008   ALKPHOS 101 02/23/2022 2008   BILITOT 0.4 02/23/2022 2008   BILITOT <0.2 01/01/2022 1536   GFRNONAA 22 (L) 02/23/2022 2008   GFRNONAA 131 05/03/2019 1216   GFRAA >60 05/16/2019 1735   GFRAA 151 05/03/2019 1216   Imaging I have reviewed the images obtained:  CT-head no acute changes Exam not consistent with LVO hence emergent CT angiography not performed given deranged renal function  Assessment:  51 year old with above past medical history presenting for evaluation of sudden onset of generalized weakness worse on the right with unclear last known well-at best the last known well was 2 PM,  which still puts him at outside the window for IV thrombolysis. He is also complaining of a lot of pain in the right upper and lower extremity. Looking at him it looks like he probably has been immobilized for more than a few hours and the fact that he was found soiled with his stools raises a question as to when he ambulated last. His creatinine that was normal 2 weeks ago is now 3.33.  His potassium which was also normal 2 weeks ago was not 7.1. Ionized calcium is low at 0.81, AST elevated at 1214, ALT 700  At this time, his symptoms could be consistent with an acute ischemic stroke but he is outside the window for IV TNKase.  His exam does not look consistent with an LVO and given his acutely deranged renal function, I decided not to pursue emergent CT angiography as that would not change his course of management.  Toxic metabolic encephalopathy (From leukocytosis, deranged renal function) versus stroke versus rhabdomyolysis causing AKI versus other causes of AKI are all in the differentials.  Impression: Strokelike symptoms-rule out stroke Toxic metabolic encephalopathy Rhabdomyolysis AKI-new onset-unclear etiology  Recommendations: I would recommend from a neurological standpoint to obtain an MRI of the brain and pursue a stroke work-up only if that is positive for stroke. If the MRI is negative for stroke, he needs work-up for toxic metabolic derangements with ER/medicine services. Neurology will follow imaging studies with you and be available to provide further recommendations should the MRI of the brain be positive for stroke. I would also recommend checking x-rays of his chest/shoulder as well as right hip Check toxicology screen Plan was discussed with the ED provider Dr. Jobe Igo  -- Milon Dikes, MD Neurologist [facility] Pager: [phone]  ------ DELAY in CT Head Patient had soiled himself very badly with  stools and needed to be cleaned up prior to  CT -----

## 2022-02-24 ENCOUNTER — Emergency Department (HOSPITAL_COMMUNITY): Payer: Self-pay

## 2022-02-24 ENCOUNTER — Inpatient Hospital Stay (HOSPITAL_COMMUNITY): Payer: Self-pay

## 2022-02-24 DIAGNOSIS — F1910 Other psychoactive substance abuse, uncomplicated: Secondary | ICD-10-CM

## 2022-02-24 DIAGNOSIS — R4182 Altered mental status, unspecified: Secondary | ICD-10-CM

## 2022-02-24 DIAGNOSIS — I639 Cerebral infarction, unspecified: Secondary | ICD-10-CM

## 2022-02-24 DIAGNOSIS — M6282 Rhabdomyolysis: Secondary | ICD-10-CM | POA: Diagnosis present

## 2022-02-24 DIAGNOSIS — I10 Essential (primary) hypertension: Secondary | ICD-10-CM

## 2022-02-24 DIAGNOSIS — N179 Acute kidney failure, unspecified: Secondary | ICD-10-CM

## 2022-02-24 LAB — RENAL FUNCTION PANEL
Albumin: 2.7 g/dL — ABNORMAL LOW (ref 3.5–5.0)
Albumin: 2.3 g/dL — ABNORMAL LOW (ref 3.5–5.0)
Anion gap: 14 (ref 5–15)
Anion gap: 17 — ABNORMAL HIGH (ref 5–15)
BUN: 71 mg/dL — ABNORMAL HIGH (ref 6–20)
BUN: 75 mg/dL — ABNORMAL HIGH (ref 6–20)
CO2: 19 mmol/L — ABNORMAL LOW (ref 22–32)
CO2: 20 mmol/L — ABNORMAL LOW (ref 22–32)
Calcium: 6.8 mg/dL — ABNORMAL LOW (ref 8.9–10.3)
Calcium: 6.7 mg/dL — ABNORMAL LOW (ref 8.9–10.3)
Chloride: 105 mmol/L (ref 98–111)
Chloride: 99 mmol/L (ref 98–111)
Creatinine, Ser: 4.12 mg/dL — ABNORMAL HIGH (ref 0.61–1.24)
Creatinine, Ser: 4.77 mg/dL — ABNORMAL HIGH (ref 0.61–1.24)
GFR, Estimated: 17 mL/min — ABNORMAL LOW (ref >60)
GFR, Estimated: 14 mL/min — ABNORMAL LOW (ref >60)
Glucose, Bld: 95 mg/dL (ref 70–99)
Glucose, Bld: 156 mg/dL — ABNORMAL HIGH (ref 70–99)
Phosphorus: 6.6 mg/dL — ABNORMAL HIGH (ref 2.5–4.6)
Phosphorus: 6 mg/dL — ABNORMAL HIGH (ref 2.5–4.6)
Potassium: 5.7 mmol/L — ABNORMAL HIGH (ref 3.5–5.1)
Potassium: 4.3 mmol/L (ref 3.5–5.1)
Sodium: 138 mmol/L (ref 135–145)
Sodium: 136 mmol/L (ref 135–145)

## 2022-02-24 LAB — I-STAT VENOUS BLOOD GAS, ED
Acid-base deficit: 8 mmol/L — ABNORMAL HIGH (ref 0.0–2.0)
Acid-base deficit: 3 mmol/L — ABNORMAL HIGH (ref 0.0–2.0)
Bicarbonate: 19 mmol/L — ABNORMAL LOW (ref 20.0–28.0)
Bicarbonate: 21.1 mmol/L (ref 20.0–28.0)
Calcium, Ion: 0.85 mmol/L — CL (ref 1.15–1.40)
Calcium, Ion: 0.85 mmol/L — CL (ref 1.15–1.40)
HCT: 46 % (ref 39.0–52.0)
HCT: 41 % (ref 39.0–52.0)
Hemoglobin: 15.6 g/dL (ref 13.0–17.0)
Hemoglobin: 13.9 g/dL (ref 13.0–17.0)
O2 Saturation: 75 %
O2 Saturation: 91 %
Potassium: 5.7 mmol/L — ABNORMAL HIGH (ref 3.5–5.1)
Potassium: 4.2 mmol/L (ref 3.5–5.1)
Sodium: 137 mmol/L (ref 135–145)
Sodium: 134 mmol/L — ABNORMAL LOW (ref 135–145)
TCO2: 20 mmol/L — ABNORMAL LOW (ref 22–32)
TCO2: 22 mmol/L (ref 22–32)
pCO2, Ven: 44 mmHg (ref 44–60)
pCO2, Ven: 33.8 mmHg — ABNORMAL LOW (ref 44–60)
pH, Ven: 7.244 — ABNORMAL LOW (ref 7.25–7.43)
pH, Ven: 7.404 (ref 7.25–7.43)
pO2, Ven: 47 mmHg — ABNORMAL HIGH (ref 32–45)
pO2, Ven: 59 mmHg — ABNORMAL HIGH (ref 32–45)

## 2022-02-24 LAB — PROCALCITONIN: Procalcitonin: 18.85 ng/mL

## 2022-02-24 LAB — CBC
HCT: 47.8 % (ref 39.0–52.0)
HCT: 50.2 % (ref 39.0–52.0)
Hemoglobin: 15.7 g/dL (ref 13.0–17.0)
Hemoglobin: 17.2 g/dL — ABNORMAL HIGH (ref 13.0–17.0)
MCH: 32.4 pg (ref 26.0–34.0)
MCH: 33.1 pg (ref 26.0–34.0)
MCHC: 32.8 g/dL (ref 30.0–36.0)
MCHC: 34.3 g/dL (ref 30.0–36.0)
MCV: 98.6 fL (ref 80.0–100.0)
MCV: 96.5 fL (ref 80.0–100.0)
Platelets: 266 10*3/uL (ref 150–400)
Platelets: 283 10*3/uL (ref 150–400)
RBC: 4.85 MIL/uL (ref 4.22–5.81)
RBC: 5.2 MIL/uL (ref 4.22–5.81)
RDW: 12.6 % (ref 11.5–15.5)
RDW: 12.5 % (ref 11.5–15.5)
WBC: 15.6 10*3/uL — ABNORMAL HIGH (ref 4.0–10.5)
WBC: 16 10*3/uL — ABNORMAL HIGH (ref 4.0–10.5)
nRBC: 0 % (ref 0.0–0.2)
nRBC: 0 % (ref 0.0–0.2)

## 2022-02-24 LAB — COMPREHENSIVE METABOLIC PANEL
ALT: 596 U/L — ABNORMAL HIGH (ref 0–44)
ALT: 627 U/L — ABNORMAL HIGH (ref 0–44)
AST: 1134 U/L — ABNORMAL HIGH (ref 15–41)
AST: 1173 U/L — ABNORMAL HIGH (ref 15–41)
Albumin: 3 g/dL — ABNORMAL LOW (ref 3.5–5.0)
Albumin: 3.1 g/dL — ABNORMAL LOW (ref 3.5–5.0)
Alkaline Phosphatase: 78 U/L (ref 38–126)
Alkaline Phosphatase: 89 U/L (ref 38–126)
Anion gap: 13 (ref 5–15)
Anion gap: 13 (ref 5–15)
BUN: 63 mg/dL — ABNORMAL HIGH (ref 6–20)
BUN: 62 mg/dL — ABNORMAL HIGH (ref 6–20)
CO2: 17 mmol/L — ABNORMAL LOW (ref 22–32)
CO2: 18 mmol/L — ABNORMAL LOW (ref 22–32)
Calcium: 6.2 mg/dL — CL (ref 8.9–10.3)
Calcium: 6.5 mg/dL — ABNORMAL LOW (ref 8.9–10.3)
Chloride: 105 mmol/L (ref 98–111)
Chloride: 107 mmol/L (ref 98–111)
Creatinine, Ser: 3.44 mg/dL — ABNORMAL HIGH (ref 0.61–1.24)
Creatinine, Ser: 3.53 mg/dL — ABNORMAL HIGH (ref 0.61–1.24)
GFR, Estimated: 21 mL/min — ABNORMAL LOW (ref >60)
GFR, Estimated: 20 mL/min — ABNORMAL LOW (ref >60)
Glucose, Bld: 111 mg/dL — ABNORMAL HIGH (ref 70–99)
Glucose, Bld: 126 mg/dL — ABNORMAL HIGH (ref 70–99)
Potassium: 5.7 mmol/L — ABNORMAL HIGH (ref 3.5–5.1)
Potassium: 6.1 mmol/L — ABNORMAL HIGH (ref 3.5–5.1)
Sodium: 135 mmol/L (ref 135–145)
Sodium: 138 mmol/L (ref 135–145)
Total Bilirubin: 0.6 mg/dL (ref 0.3–1.2)
Total Bilirubin: 0.7 mg/dL (ref 0.3–1.2)
Total Protein: 5.7 g/dL — ABNORMAL LOW (ref 6.5–8.1)
Total Protein: 6.2 g/dL — ABNORMAL LOW (ref 6.5–8.1)

## 2022-02-24 LAB — LIPID PANEL
Cholesterol: 102 mg/dL (ref 0–200)
HDL: 34 mg/dL — ABNORMAL LOW (ref >40)
LDL Cholesterol: 58 mg/dL (ref 0–99)
Total CHOL/HDL Ratio: 3 RATIO
Triglycerides: 49 mg/dL (ref <150)
VLDL: 10 mg/dL (ref 0–40)

## 2022-02-24 LAB — CK
Total CK: >50000 U/L — ABNORMAL HIGH (ref 49–397)
Total CK: >50000 U/L — ABNORMAL HIGH (ref 49–397)
Total CK: 38091 U/L — ABNORMAL HIGH (ref 49–397)

## 2022-02-24 LAB — ACETAMINOPHEN LEVEL: Acetaminophen (Tylenol), Serum: <10 ug/mL — ABNORMAL LOW (ref 10–30)

## 2022-02-24 LAB — C-REACTIVE PROTEIN: CRP: 7.4 mg/dL — ABNORMAL HIGH (ref <1.0)

## 2022-02-24 LAB — HEMOGLOBIN A1C
Hgb A1c MFr Bld: 5.7 % — ABNORMAL HIGH (ref 4.8–5.6)
Mean Plasma Glucose: 116.89 mg/dL

## 2022-02-24 LAB — HIV ANTIBODY (ROUTINE TESTING W REFLEX): HIV Screen 4th Generation wRfx: NONREACTIVE

## 2022-02-24 LAB — CBG MONITORING, ED: Glucose-Capillary: 131 mg/dL — ABNORMAL HIGH (ref 70–99)

## 2022-02-24 LAB — SEDIMENTATION RATE: Sed Rate: 1 mm/hr (ref 0–16)

## 2022-02-24 MED ORDER — CALCIUM GLUCONATE 10 % IV SOLN
2.0000 g | Freq: Once | INTRAVENOUS | Status: AC
  Administered 2022-02-24: 2 g via INTRAVENOUS
  Filled 2022-02-24: qty 10

## 2022-02-24 MED ORDER — PIPERACILLIN-TAZOBACTAM 3.375 G IVPB
3.3750 g | Freq: Three times a day (TID) | INTRAVENOUS | Status: DC
  Administered 2022-02-24 – 2022-02-25 (×3): 3.375 g via INTRAVENOUS
  Filled 2022-02-24 (×3): qty 50

## 2022-02-24 MED ORDER — VANCOMYCIN VARIABLE DOSE PER UNSTABLE RENAL FUNCTION (PHARMACIST DOSING): Status: DC

## 2022-02-24 MED ORDER — METOPROLOL TARTRATE 25 MG PO TABS: 50.0000 mg | ORAL_TABLET | Freq: Two times a day (BID) | ORAL | Status: DC

## 2022-02-24 MED ORDER — NALOXONE HCL 0.4 MG/ML IJ SOLN: 0.4000 mg | INTRAMUSCULAR | Status: DC | PRN

## 2022-02-24 MED ORDER — LACTATED RINGERS IV SOLN: INTRAVENOUS | Status: DC

## 2022-02-24 MED ORDER — ONDANSETRON HCL 4 MG/2ML IJ SOLN
4.0000 mg | Freq: Four times a day (QID) | INTRAMUSCULAR | Status: DC | PRN
  Administered 2022-02-24 – 2022-02-26 (×2): 4 mg via INTRAVENOUS
  Filled 2022-02-24 (×2): qty 2

## 2022-02-24 MED ORDER — STROKE: EARLY STAGES OF RECOVERY BOOK
Freq: Once | Status: AC
  Filled 2022-02-24 (×2): qty 1

## 2022-02-24 MED ORDER — HYDROMORPHONE HCL 1 MG/ML IJ SOLN
0.5000 mg | Freq: Once | INTRAMUSCULAR | Status: AC
  Administered 2022-02-24: 0.5 mg via INTRAVENOUS
  Filled 2022-02-24: qty 1

## 2022-02-24 MED ORDER — VANCOMYCIN HCL 1250 MG/250ML IV SOLN
1250.0000 mg | Freq: Two times a day (BID) | INTRAVENOUS | Status: DC
  Administered 2022-02-24: 1250 mg via INTRAVENOUS
  Filled 2022-02-24 (×2): qty 250

## 2022-02-24 MED ORDER — SODIUM CHLORIDE 0.9 % IV BOLUS (SEPSIS)
1000.0000 mL | Freq: Once | INTRAVENOUS | Status: AC
  Administered 2022-02-24: 1000 mL via INTRAVENOUS

## 2022-02-24 MED ORDER — ONDANSETRON HCL 4 MG PO TABS
4.0000 mg | ORAL_TABLET | Freq: Four times a day (QID) | ORAL | Status: DC | PRN
  Administered 2022-02-27: 4 mg via ORAL
  Filled 2022-02-24: qty 1

## 2022-02-24 MED ORDER — HYDROMORPHONE HCL 1 MG/ML IJ SOLN
1.0000 mg | INTRAMUSCULAR | Status: DC | PRN
  Administered 2022-02-24 (×4): 1 mg via INTRAVENOUS
  Filled 2022-02-24 (×4): qty 1

## 2022-02-24 MED ORDER — SODIUM CHLORIDE 0.9 % IV SOLN: INTRAVENOUS | Status: DC

## 2022-02-24 MED ORDER — METOPROLOL TARTRATE 50 MG PO TABS
50.0000 mg | ORAL_TABLET | Freq: Two times a day (BID) | ORAL | Status: DC
  Administered 2022-02-24 – 2022-02-28 (×8): 50 mg via ORAL
  Filled 2022-02-24 (×3): qty 1
  Filled 2022-02-24: qty 2
  Filled 2022-02-24 (×4): qty 1

## 2022-02-24 MED ORDER — LABETALOL HCL 5 MG/ML IV SOLN
5.0000 mg | INTRAVENOUS | Status: DC | PRN
  Administered 2022-02-24: 5 mg via INTRAVENOUS
  Filled 2022-02-24: qty 4

## 2022-02-24 MED ORDER — SODIUM ZIRCONIUM CYCLOSILICATE 10 G PO PACK
10.0000 g | PACK | Freq: Three times a day (TID) | ORAL | Status: DC
  Administered 2022-02-24 (×3): 10 g via ORAL
  Filled 2022-02-24 (×3): qty 1

## 2022-02-24 MED ORDER — SODIUM BICARBONATE 8.4 % IV SOLN
INTRAVENOUS | Status: AC
  Filled 2022-02-24 (×3): qty 1000

## 2022-02-24 MED ORDER — HEPARIN SODIUM (PORCINE) 5000 UNIT/ML IJ SOLN
5000.0000 [IU] | Freq: Three times a day (TID) | INTRAMUSCULAR | Status: DC
  Administered 2022-02-24 – 2022-03-05 (×28): 5000 [IU] via SUBCUTANEOUS
  Filled 2022-02-24 (×29): qty 1

## 2022-02-24 MED ORDER — OXYCODONE HCL 5 MG PO TABS
5.0000 mg | ORAL_TABLET | ORAL | Status: DC | PRN
  Administered 2022-02-24 – 2022-03-04 (×36): 5 mg via ORAL
  Filled 2022-02-24 (×37): qty 1

## 2022-02-24 MED ORDER — HYDROMORPHONE HCL 1 MG/ML IJ SOLN
1.0000 mg | INTRAMUSCULAR | Status: DC | PRN
  Administered 2022-02-24 – 2022-03-04 (×41): 1 mg via INTRAVENOUS
  Filled 2022-02-24 (×43): qty 1

## 2022-02-24 MED ORDER — LORAZEPAM 2 MG/ML IJ SOLN
0.5000 mg | Freq: Once | INTRAMUSCULAR | Status: AC
  Administered 2022-02-24: 0.5 mg via INTRAVENOUS
  Filled 2022-02-24: qty 1

## 2022-02-24 MED ORDER — LINEZOLID 600 MG/300ML IV SOLN
600.0000 mg | Freq: Two times a day (BID) | INTRAVENOUS | Status: DC
  Administered 2022-02-24 – 2022-02-26 (×6): 600 mg via INTRAVENOUS
  Filled 2022-02-24 (×7): qty 300

## 2022-02-24 MED ORDER — ALBUTEROL SULFATE (2.5 MG/3ML) 0.083% IN NEBU
10.0000 mg | INHALATION_SOLUTION | Freq: Once | RESPIRATORY_TRACT | Status: AC
  Administered 2022-02-24: 10 mg via RESPIRATORY_TRACT
  Filled 2022-02-24: qty 12

## 2022-02-24 MED ORDER — HYDRALAZINE HCL 20 MG/ML IJ SOLN
5.0000 mg | INTRAMUSCULAR | Status: DC | PRN
  Administered 2022-02-24: 5 mg via INTRAVENOUS
  Filled 2022-02-24: qty 1

## 2022-02-24 NOTE — ED Notes (Signed)
This RN spoke with Dr. Marlowe Sax and informed her of the patient's BP of 187/123 and HR of 112 despite giving the PRN hydralazine and scheduled Metoprolol. She stated she would look through his chart and call me back soon and will place orders.

## 2022-02-24 NOTE — ED Notes (Signed)
This RN has messaged Dr. Marlowe Sax to ask if she wants to consult ortho again to come reassess the patient's leg for compartment syndrome due the patients right leg and hip appearing swollen and firm. Awaiting response.

## 2022-02-24 NOTE — ED Provider Notes (Signed)
Patient appears more comfortable.  He remains neurovascularly intact in the right lower extremity.  There is no worsening of his leg symptoms. He is awaiting admission   Ripley Fraise, MD 02/24/22 [phone]

## 2022-02-24 NOTE — ED Notes (Addendum)
This RN spoke to Dr. Rory Percy via telephonic communication and he stated that the BP parameters for the PRN Labetalol were fine, to treat BP of systolic >552 per permissive hypertension due to stroke.

## 2022-02-24 NOTE — ED Notes (Signed)
Pt notified of NPO status.

## 2022-02-24 NOTE — ED Notes (Signed)
Pt transported to MRI 

## 2022-02-24 NOTE — ED Notes (Addendum)
This RN spoke to Dr. Marlowe Sax via telephone conversation to inform her the patient's BP is still 187/126. She stated she placed parameters for the PRN Labetalol and instructed me to call neurology MD to see if they would rather the BP parameters be different. This RN has now paged neurology MD Dr Rory Percy.

## 2022-02-24 NOTE — ED Notes (Signed)
This RN has paged Dr. Marlowe Sax again.
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
## 2022-02-24 NOTE — ED Notes (Addendum)
No response from Dr. Marlowe Sax via epic secure chat. This RN has now paged her. Awaiting call back.

## 2022-02-24 NOTE — Progress Notes (Addendum)
NAME:  ORDELL Anderson, MRN:  [id number], DOB:  [birth year], LOS: 0 ADMISSION DATE:  02/23/2022 CONSULTATION DATE:  02/23/2022 REFERRING MD:  Doran Durand - EDP CHIEF COMPLAINT:  R-sided numbness/weakness, AMS, ARF   History of Present Illness:  51 year old man who presented to [facility] ED 10/30 via EMS for acute onset of right-sided weakness. Initial concern for CVA and Code Stroke called; LKW 1400 10/30. S/p fall at home on day of admission. 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 Some IVDA in the past (last 2020); current tobacco use. Per patient, he has been clean from drugs since 01/15/2022, previously was snorting fentanyl (UDS on admission 10/30 positive for cocaine, BZD, amphetamines). Recently presented for fentanyl OD (wife administered Narcan and provided CPR, EMS not called) and R focal chest pain.  On ED arrival, Code Stroke was activated. CT Head with NAICA, small remote R cerebellar infarct. Given c/o RUE/RLE pain, XR CXR and Pelvis were obtained and negative.Vitals demonstrated temp 100.56F, HR 106, RR 16, SpO2 93%. Labs were notable for WBC 16.4, concentrated H&H Hgb 17.3, Plt 349, INR 1.2, Na 137, K 7.1, BUN 56 (baseline 20s), Cr 3.33 (baseline 0.8), elevated transaminases with AST 1214, ALT 700. CK severely elevated at 43K. UA with moderage Hgb, protein. UDS positive for cocaine, benzodiazepines, amphetamines.  PCCM consulted for possible ICU admission.  Pertinent Medical History:   Past Medical History:  Diagnosis Date   Drug abuse (HCC)    H/O: rheumatic fever    Diagnosed at 51 y/o.  Resolved at 51 y/o.  States he was diagnosed with heart murmur felt due to this   Hypertension 2012   Significant Hospital Events: Including procedures, antibiotic start and stop dates in addition to other pertinent events   10/30 - Presented as possible Code Stroke to [facility]  ED. CT Head negative. Severe rhabdo, AKI/ARF, transaminitis after being found down. CK 43,000. PCCM consulted.  Interim History / Subjective:  Intensive care not needed at this time pulmonary critical care will be available as needed  Objective:  Blood pressure (!) 158/92, pulse (!) 107, temperature 98.6 F (37 C), temperature source Oral, resp. rate 17, height 6' (1.829 m), weight 56.7 kg, SpO2 92 %.        Intake/Output Summary (Last 24 hours) at 02/24/2022 0905 Last data filed at 02/24/2022 5009 Gross per 24 hour  Intake 3750 ml  Output --  Net 3750 ml   Filed Weights   02/23/22 2046  Weight: 56.7 kg   Physical Examination: General: Awake alert HEENT: MM pink/moist dry oral mucosa Neuro: Somewhat stunned follows commands moves all extremities CV: Heart sounds are regular PULM: Mild rhonchi GI: soft, bsx4 active  GU: Voided 300 cc Extremities: warm/dry, right hip with evidence of edema without breakdown   Skin: no rashes or lesions continue to monitor edema   Resolved Hospital Problem List:    Assessment & Plan:   Mr. Franklin Anderson is seen in consultation at the request of Dr. Doran Durand (EDP) for further evaluation and management of ARF in the setting of severe rhabdomyolysis and possible ICU admission.  51 year old man who presented to [facility] ED 10/30 via EMS for acute onset of right-sided weakness. Initial concern for CVA and Code Stroke called; LKW 1400 10/30. S/p fall at home on day of admission. Per patient, he has been clean from drugs since 01/15/2022, previously was snorting fentanyl (UDS on admission 10/30 positive for  cocaine, BZD, amphetamines).  Severe AKI with acute renal failure in the setting of rhabdomyolysis Hyperkalemia Concern for/at risk for compartment syndrome Transaminitis Metabolic derangements in the setting of prolonged downtime Polysubstance abuse  Currently appears to be stable Does not need to be in the intensive care unit at this  time Appears to be in acute kidney injury with rhabdo with right-sided hip edema Creatinine remains elevated Needs renal consult Questionable he will need hemodialysis Hydration Continue to monitor creatinine  PCCM will be available as needed    Best Practice: (right click and "Reselect all SmartList Selections" daily)   Per Primary Team  Labs:  CBC: Recent Labs  Lab 02/23/22 2008 02/23/22 2016 02/23/22 2301 02/24/22 0205 02/24/22 0252 02/24/22 0338  WBC 16.4*  --   --  15.6*  --  16.0*  NEUTROABS 14.3*  --   --   --   --   --   HGB 17.3* 18.0* 15.3 15.7 15.6 17.2*  HCT 53.1* 53.0* 45.0 47.8 46.0 50.2  MCV 97.8  --   --  98.6  --  96.5  PLT 349  --   --  266  --  283   Basic Metabolic Panel: Recent Labs  Lab 02/23/22 2008 02/23/22 2016 02/23/22 2301 02/24/22 0205 02/24/22 0252 02/24/22 0338  NA 137 137 139 135 137 138  K 7.1* 7.1* 5.2* 5.7* 5.7* 6.1*  CL 98 101 106 105  --  107  CO2 22  --   --  17*  --  18*  GLUCOSE 145* 139* 90 111*  --  126*  BUN 56* 68* 54* 63*  --  62*  CREATININE 3.33* 3.40* 3.40* 3.44*  --  3.53*  CALCIUM 7.0*  --   --  6.2*  --  6.5*   GFR: Estimated Creatinine Clearance: 19.9 mL/min (A) (by C-G formula based on SCr of 3.53 mg/dL (H)). Recent Labs  Lab 02/23/22 2008 02/24/22 0205 02/24/22 0338  PROCALCITON  --   --  18.85  WBC 16.4* 15.6* 16.0*   Liver Function Tests: Recent Labs  Lab 02/23/22 2008 02/24/22 0205 02/24/22 0338  AST 1,214* 1,134* 1,173*  ALT 700* 596* 627*  ALKPHOS 101 78 89  BILITOT 0.4 0.6 0.7  PROT 7.6 5.7* 6.2*  ALBUMIN 4.2 3.0* 3.1*   No results for input(s): "LIPASE", "AMYLASE" in the last 168 hours. No results for input(s): "AMMONIA" in the last 168 hours.  ABG:    Component Value Date/Time   PHART 7.368 02/22/2019 2133   PCO2ART 42.7 02/22/2019 2133   PO2ART 302 (H) 02/22/2019 2133   HCO3 19.0 (L) 02/24/2022 0252   TCO2 20 (L) 02/24/2022 0252   ACIDBASEDEF 8.0 (H) 02/24/2022 0252    O2SAT 75 02/24/2022 0252    Coagulation Profile: Recent Labs  Lab 02/23/22 2008  INR 1.2   Cardiac Enzymes: Recent Labs  Lab 02/23/22 2016 02/24/22 0454  CKTOTAL 43,333* >50,000*   HbA1C: Hgb A1c MFr Bld  Date/Time Value Ref Range Status  02/24/2022 02:05 AM 5.7 (H) 4.8 - 5.6 % Final    Comment:    (NOTE) Pre diabetes:          5.7%-6.4%  Diabetes:              >6.4%  Glycemic control for   <7.0% adults with diabetes   04/11/2019 10:02 AM 4.5 (L) 4.8 - 5.6 % Final    Comment:    (NOTE) Pre diabetes:  5.7%-6.4% Diabetes:              >6.4% Glycemic control for   <7.0% adults with diabetes    CBG: Recent Labs  Lab 02/23/22 2008 02/24/22 0747  GLUCAP 142* 131*    Critical care time: 33 minutes   Richardson Landry Taven Strite ACNP Acute Care Nurse Practitioner Regino Ramirez Please consult [place] 02/24/2022, 9:05 AM

## 2022-02-24 NOTE — ED Notes (Signed)
Pt's BP continues to be elevated at 187/123. This RN has paged floor coverage MD Dr. Marlowe Sax.

## 2022-02-24 NOTE — ED Notes (Signed)
Pt's wife at bedside.

## 2022-02-24 NOTE — Progress Notes (Addendum)
Pharmacy Antibiotic Note  Franklin Anderson is a 51 y.o. 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  Pharmacy has been consulted for vancomycin dosing. Baseline SCr is 0.45-1.02 and current SCr is ~3.4.  Plan: Vancomycin 1250 mg IV x1.  Will continue to monitor for when next vanc dose would be appropriate based on renal function.  Height: 6' (182.9 cm) Weight: 56.7 kg (125 lb) IBW/kg (Calculated) : 77.6  Temp (24hrs), Avg:99.4 F (37.4 C), Min:98.6 F (37 C), Max:100.1 F (37.8 C)  Recent Labs  Lab 02/23/22 2008 02/23/22 2016 02/23/22 2301 2022-03-04 0205 2022/03/04 0338  WBC 16.4*  --   --  15.6* 16.0*  CREATININE 3.33* 3.40* 3.40* 3.44* 3.53*    Estimated Creatinine Clearance: 19.9 mL/min (A) (by C-G formula based on SCr of 3.53 mg/dL (H)).    Allergies  Allergen Reactions   Sulfa Antibiotics Anaphylaxis   Penicillins Other (See Comments)    "Near death" reaction per pt 04-Mar-2022, could not elaborate.   Microbiology results: 05-Mar-2023 BCx: IP March 05, 2023 UCx: IP   Thank you for allowing pharmacy to be a part of this patient's care.  Lerry Liner, PharmD Candidate class of 2024 03-04-22 7:10 AM

## 2022-02-24 NOTE — Progress Notes (Signed)
Pt dw Dr. Marcello Moores.   No surgical issues at this time.  Pt with hx of poly substance abuse and now RUE and RLE pain and devloping rhabdo.  Recommend monitoring and medical management of the rhabdomyolysis.
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
## 2022-02-24 NOTE — Progress Notes (Signed)
Subjective: Severe RLE pain involving hip and knee. Knee is swollen and warm.   Objective: Current vital signs: BP (!) 187/118   Pulse (!) 109   Temp 98.3 F (36.8 C) (Oral)   Resp 11   Ht 6' (1.829 m)   Wt 56.7 kg   SpO2 94%   BMI 16.95 kg/m  Vital signs in last 24 hours: Temp:  [98.3 F (36.8 C)-100.1 F (37.8 C)] 98.3 F (36.8 C) (10/31 1159) Pulse Rate:  [97-125] 109 (10/31 1915) Resp:  [11-22] 11 (10/31 1915) BP: (129-208)/(90-133) 187/118 (10/31 1915) SpO2:  [90 %-98 %] 94 % (10/31 1915) Weight:  [56.7 kg] 56.7 kg (10/30 2046)  Intake/Output from previous day: 10/30 0701 - 10/31 0700 In: 2750 [IV Piggyback:2750] Out: -  Intake/Output this shift: No intake/output data recorded. Nutritional status:  Diet Order             DIET SOFT Room service appropriate? Yes; Fluid consistency: Thin  Diet effective now                  HEENT: [place]/AT Lungs: Respirations unlabored:  Ext: Right knee is swollen and warm; patient experiences excruciating pain when it is moved.   Neurologic Exam: Ment: Awake and alert. Speech fluent with intact comprehension and naming.  CN: Fixates and tracks normally. PERRL. Temp sensation equal bilaterally. Face symmetric. Phonation intact. Tongue protrudes midline.  Motor:  BUE 5/5.  RLE unable to move due to severe pain in hip and knee with evidence for knee inflammation.  LLE 4/5 in the context of pain Sensory: Intact to touch x 4 Reflexes: 2+ bilateral brachioradialis. Deferred patellar reflexes due to knee pain.  Cerebellar: No ataxia noted Gait: Unable to assess due to severe pain   Lab Results: Results for orders placed or performed during the hospital encounter of 02/23/22 (from the past 48 hour(s))  Ethanol     Status: None   Collection Time: 02/23/22  8:08 PM  Result Value Ref Range   Alcohol, Ethyl (B) <10 <10 mg/dL    Comment: (NOTE) Lowest detectable limit for serum alcohol is 10 mg/dL.  For medical purposes  only. Performed at [facility], [place] [address] [place], [place] [address]   Protime-INR     Status: None   Collection Time: 02/23/22  8:08 PM  Result Value Ref Range   Prothrombin Time 14.8 11.4 - 15.2 seconds   INR 1.2 0.8 - 1.2    Comment: (NOTE) INR goal varies based on device and disease states. Performed at [facility], [place] [address] [place], [place] [address]   APTT     Status: Abnormal   Collection Time: 02/23/22  8:08 PM  Result Value Ref Range   aPTT 43 (H) 24 - 36 seconds    Comment:        IF BASELINE aPTT IS ELEVATED, SUGGEST PATIENT RISK ASSESSMENT BE USED TO DETERMINE APPROPRIATE ANTICOAGULANT THERAPY. Performed at [facility], [place] [address], [place], [place] [address]   CBC     Status: Abnormal   Collection Time: 02/23/22  8:08 PM  Result Value Ref Range   WBC 16.4 (H) 4.0 - 10.5 K/uL   RBC 5.43 4.22 - 5.81 MIL/uL   Hemoglobin 17.3 (H) 13.0 - 17.0 g/dL   HCT 53.1 (H) 39.0 - 52.0 %   MCV 97.8 80.0 - 100.0 fL   MCH 31.9 26.0 - 34.0 pg   MCHC 32.6 30.0 -  36.0 g/dL   RDW 12.5 11.5 - 15.5 %   Platelets 349 150 - 400 K/uL   nRBC 0.0 0.0 - 0.2 %    Comment: Performed at [facility], [place] [address], [place], [place] [address]  Differential     Status: Abnormal   Collection Time: 02/23/22  8:08 PM  Result Value Ref Range   Neutrophils Relative % 87 %   Neutro Abs 14.3 (H) 1.7 - 7.7 K/uL   Lymphocytes Relative 6 %   Lymphs Abs 1.0 0.7 - 4.0 K/uL   Monocytes Relative 6 %   Monocytes Absolute 1.0 0.1 - 1.0 K/uL   Eosinophils Relative 0 %   Eosinophils Absolute 0.0 0.0 - 0.5 K/uL   Basophils Relative 0 %   Basophils Absolute 0.0 0.0 - 0.1 K/uL   Immature Granulocytes 1 %   Abs Immature Granulocytes 0.09 (H) 0.00 - 0.07 K/uL    Comment: Performed at [facility] [address], [place], [place] [address]  Comprehensive metabolic panel     Status: Abnormal   Collection Time: 02/23/22  8:08 PM  Result Value Ref Range    Sodium 137 135 - 145 mmol/L   Potassium 7.1 (HH) 3.5 - 5.1 mmol/L    Comment: CRITICAL RESULT CALLED TO, READ BACK BY AND VERIFIED WITH J DODD,RN 2106 02/23/2022 WBOND   Chloride 98 98 - 111 mmol/L   CO2 22 22 - 32 mmol/L   Glucose, Bld 145 (H) 70 - 99 mg/dL    Comment: Glucose reference range applies only to samples taken after fasting for at least 8 hours.   BUN 56 (H) 6 - 20 mg/dL   Creatinine, Ser 3.33 (H) 0.61 - 1.24 mg/dL   Calcium 7.0 (L) 8.9 - 10.3 mg/dL   Total Protein 7.6 6.5 - 8.1 g/dL   Albumin 4.2 3.5 - 5.0 g/dL   AST 1,214 (H) 15 - 41 U/L   ALT 700 (H) 0 - 44 U/L   Alkaline Phosphatase 101 38 - 126 U/L   Total Bilirubin 0.4 0.3 - 1.2 mg/dL   GFR, Estimated 22 (L) >60 mL/min    Comment: (NOTE) Calculated using the CKD-EPI Creatinine Equation (2021)    Anion gap 17 (H) 5 - 15    Comment: Performed at [facility], [place] [address] [place], [place] [address]  CBG monitoring, ED     Status: Abnormal   Collection Time: 02/23/22  8:08 PM  Result Value Ref Range   Glucose-Capillary 142 (H) 70 - 99 mg/dL    Comment: Glucose reference range applies only to samples taken after fasting for at least 8 hours.  I-stat chem 8, ED     Status: Abnormal   Collection Time: 02/23/22  8:16 PM  Result Value Ref Range   Sodium 137 135 - 145 mmol/L   Potassium 7.1 (HH) 3.5 - 5.1 mmol/L   Chloride 101 98 - 111 mmol/L   BUN 68 (H) 6 - 20 mg/dL   Creatinine, Ser 3.40 (H) 0.61 - 1.24 mg/dL   Glucose, Bld 139 (H) 70 - 99 mg/dL    Comment: Glucose reference range applies only to samples taken after fasting for at least 8 hours.   Calcium, Ion 0.81 (LL) 1.15 - 1.40 mmol/L   TCO2 25 22 - 32 mmol/L   Hemoglobin 18.0 (H) 13.0 - 17.0 g/dL   HCT 53.0 (H) 39.0 - 52.0 %   Comment NOTIFIED PHYSICIAN   CK  Status: Abnormal   Collection Time: 02/23/22  8:16 PM  Result Value Ref Range   Total CK 43,333 (H) 49 - 397 U/L    Comment: RESULT CONFIRMED BY MANUAL DILUTION Performed at [facility], [place] [address], [place], [place] [address]   I-stat chem 8, ED (not at [facility] or [facility])     Status: Abnormal   Collection Time: 02/23/22 11:01 PM  Result Value Ref Range   Sodium 139 135 - 145 mmol/L   Potassium 5.2 (H) 3.5 - 5.1 mmol/L   Chloride 106 98 - 111 mmol/L   BUN 54 (H) 6 - 20 mg/dL   Creatinine, Ser 3.40 (H) 0.61 - 1.24 mg/dL   Glucose, Bld 90 70 - 99 mg/dL    Comment: Glucose reference range applies only to samples taken after fasting for at least 8 hours.   Calcium, Ion 0.83 (LL) 1.15 - 1.40 mmol/L   TCO2 21 (L) 22 - 32 mmol/L   Hemoglobin 15.3 13.0 - 17.0 g/dL   HCT 45.0 39.0 - 52.0 %   Comment NOTIFIED PHYSICIAN   Urine rapid drug screen (hosp performed)     Status: Abnormal   Collection Time: 02/23/22 11:23 PM  Result Value Ref Range   Opiates NONE DETECTED NONE DETECTED   Cocaine POSITIVE (A) NONE DETECTED   Benzodiazepines POSITIVE (A) NONE DETECTED   Amphetamines POSITIVE (A) NONE DETECTED   Tetrahydrocannabinol NONE DETECTED NONE DETECTED   Barbiturates NONE DETECTED NONE DETECTED    Comment: (NOTE) DRUG SCREEN FOR MEDICAL PURPOSES ONLY.  IF CONFIRMATION IS NEEDED FOR ANY PURPOSE, NOTIFY LAB WITHIN 5 DAYS.  LOWEST DETECTABLE LIMITS FOR URINE DRUG SCREEN Drug Class                     Cutoff (ng/mL) Amphetamine and metabolites    1000 Barbiturate and metabolites    200 Benzodiazepine                 200 Opiates and metabolites        300 Cocaine and metabolites        300 THC                            50 Performed at [facility], [place] [address], [place], [place] [address]   Urinalysis, Routine w reflex microscopic     Status: Abnormal   Collection Time: 02/23/22 11:23 PM  Result Value Ref Range   Color, Urine AMBER (A) YELLOW    Comment: BIOCHEMICALS MAY BE AFFECTED BY COLOR   APPearance HAZY (A) CLEAR   Specific Gravity, Urine 1.015 1.005 - 1.030   pH 5.0 5.0 - 8.0   Glucose, UA NEGATIVE NEGATIVE mg/dL   Hgb urine dipstick MODERATE  (A) NEGATIVE   Bilirubin Urine NEGATIVE NEGATIVE   Ketones, ur NEGATIVE NEGATIVE mg/dL   Protein, ur 100 (A) NEGATIVE mg/dL   Nitrite NEGATIVE NEGATIVE   Leukocytes,Ua NEGATIVE NEGATIVE   RBC / HPF 0-5 0 - 5 RBC/hpf   WBC, UA 6-10 0 - 5 WBC/hpf   Bacteria, UA RARE (A) NONE SEEN   Squamous Epithelial / LPF 0-5 0 - 5   Mucus PRESENT     Comment: Performed at [facility], [address], [place], [place] [address]  HIV Antibody (routine testing w rflx)     Status: None   Collection Time: 02/24/22  2:05 AM  Result Value Ref Range   HIV Screen 4th Generation wRfx Non Reactive Non Reactive    Comment: Performed at [facility], [place] [address] [place], [place] [address]  CBC     Status: Abnormal   Collection Time: 02/24/22  2:05 AM  Result Value Ref Range   WBC 15.6 (H) 4.0 - 10.5 K/uL   RBC 4.85 4.22 - 5.81 MIL/uL   Hemoglobin 15.7 13.0 - 17.0 g/dL   HCT 47.8 39.0 - 52.0 %   MCV 98.6 80.0 - 100.0 fL   MCH 32.4 26.0 - 34.0 pg   MCHC 32.8 30.0 - 36.0 g/dL   RDW 12.6 11.5 - 15.5 %   Platelets 266 150 - 400 K/uL   nRBC 0.0 0.0 - 0.2 %    Comment: Performed at [facility], [place] [address] [place], [place] [address]  Comprehensive metabolic panel     Status: Abnormal   Collection Time: 02/24/22  2:05 AM  Result Value Ref Range   Sodium 135 135 - 145 mmol/L   Potassium 5.7 (H) 3.5 - 5.1 mmol/L   Chloride 105 98 - 111 mmol/L   CO2 17 (L) 22 - 32 mmol/L   Glucose, Bld 111 (H) 70 - 99 mg/dL    Comment: Glucose reference range applies only to samples taken after fasting for at least 8 hours.   BUN 63 (H) 6 - 20 mg/dL   Creatinine, Ser 3.44 (H) 0.61 - 1.24 mg/dL   Calcium 6.2 (LL) 8.9 - 10.3 mg/dL    Comment: CRITICAL RESULT CALLED TO, READ BACK BY AND VERIFIED WITH JACKIE DODD RN 02/24/22 0338 M KOROLESKI   Total Protein 5.7 (L) 6.5 - 8.1 g/dL   Albumin 3.0 (L) 3.5 - 5.0 g/dL   AST 1,134 (H) 15 - 41 U/L   ALT 596 (H) 0 - 44 U/L   Alkaline Phosphatase 78 38 - 126  U/L   Total Bilirubin 0.6 0.3 - 1.2 mg/dL   GFR, Estimated 21 (L) >60 mL/min    Comment: (NOTE) Calculated using the CKD-EPI Creatinine Equation (2021)    Anion gap 13 5 - 15    Comment: Performed at [facility] [address], [place], [place] [address]  Hemoglobin A1c     Status: Abnormal   Collection Time: 02/24/22  2:05 AM  Result Value Ref Range   Hgb A1c MFr Bld 5.7 (H) 4.8 - 5.6 %    Comment: (NOTE) Pre diabetes:          5.7%-6.4%  Diabetes:              >6.4%  Glycemic control for   <7.0% adults with diabetes    Mean Plasma Glucose 116.89 mg/dL    Comment: Performed at [facility] [address] [place],  [address]  I-Stat venous blood gas, ED     Status: Abnormal   Collection Time: 02/24/22  2:52 AM  Result Value Ref Range   pH, Ven 7.244 (L) 7.25 - 7.43   pCO2, Ven 44.0 44 - 60 mmHg   pO2, Ven 47 (H) 32 - 45 mmHg   Bicarbonate 19.0 (L) 20.0 - 28.0 mmol/L   TCO2 20 (L) 22 - 32 mmol/L   O2 Saturation 75 %   Acid-base deficit 8.0 (H) 0.0 - 2.0 mmol/L   Sodium 137 135 - 145 mmol/L   Potassium 5.7 (H) 3.5 - 5.1 mmol/L   Calcium, Ion 0.85 (LL)  1.15 - 1.40 mmol/L   HCT 46.0 39.0 - 52.0 %   Hemoglobin 15.6 13.0 - 17.0 g/dL   Sample type VENOUS    Comment NOTIFIED PHYSICIAN   CBC     Status: Abnormal   Collection Time: 02/24/22  3:38 AM  Result Value Ref Range   WBC 16.0 (H) 4.0 - 10.5 K/uL   RBC 5.20 4.22 - 5.81 MIL/uL   Hemoglobin 17.2 (H) 13.0 - 17.0 g/dL   HCT 50.2 39.0 - 52.0 %   MCV 96.5 80.0 - 100.0 fL   MCH 33.1 26.0 - 34.0 pg   MCHC 34.3 30.0 - 36.0 g/dL   RDW 12.5 11.5 - 15.5 %   Platelets 283 150 - 400 K/uL   nRBC 0.0 0.0 - 0.2 %    Comment: Performed at [facility], [place] [address] [place], [place] [address]  Comprehensive metabolic panel     Status: Abnormal   Collection Time: 02/24/22  3:38 AM  Result Value Ref Range   Sodium 138 135 - 145 mmol/L   Potassium 6.1 (H) 3.5 - 5.1 mmol/L   Chloride 107 98 - 111 mmol/L    CO2 18 (L) 22 - 32 mmol/L   Glucose, Bld 126 (H) 70 - 99 mg/dL    Comment: Glucose reference range applies only to samples taken after fasting for at least 8 hours.   BUN 62 (H) 6 - 20 mg/dL   Creatinine, Ser 3.53 (H) 0.61 - 1.24 mg/dL   Calcium 6.5 (L) 8.9 - 10.3 mg/dL   Total Protein 6.2 (L) 6.5 - 8.1 g/dL   Albumin 3.1 (L) 3.5 - 5.0 g/dL   AST 1,173 (H) 15 - 41 U/L   ALT 627 (H) 0 - 44 U/L   Alkaline Phosphatase 89 38 - 126 U/L   Total Bilirubin 0.7 0.3 - 1.2 mg/dL   GFR, Estimated 20 (L) >60 mL/min    Comment: (NOTE) Calculated using the CKD-EPI Creatinine Equation (2021)    Anion gap 13 5 - 15    Comment: Performed at [facility] [address], [place], [place] [address]  Sedimentation rate     Status: None   Collection Time: 02/24/22  3:38 AM  Result Value Ref Range   Sed Rate 1 0 - 16 mm/hr    Comment: Performed at [facility] [address] [place], [place] [address]  Procalcitonin - Baseline     Status: None   Collection Time: 02/24/22  3:38 AM  Result Value Ref Range   Procalcitonin 18.85 ng/mL    Comment:        Interpretation: PCT >= 10 ng/mL: Important systemic inflammatory response, almost exclusively due to severe bacterial sepsis or septic shock. (NOTE)       Sepsis PCT Algorithm           Lower Respiratory Tract                                      Infection PCT Algorithm    ----------------------------     ----------------------------         PCT < 0.25 ng/mL                PCT < 0.10 ng/mL          Strongly encourage  Strongly discourage   discontinuation of antibiotics    initiation of antibiotics    ----------------------------     -----------------------------       PCT 0.25 - 0.50 ng/mL            PCT 0.10 - 0.25 ng/mL               OR       >80% decrease in PCT            Discourage initiation of                                            antibiotics      Encourage discontinuation           of antibiotics     ----------------------------     -----------------------------         PCT >= 0.50 ng/mL              PCT 0.26 - 0.50 ng/mL                AND       <80% decrease in PCT             Encourage initiation of                                             antibiotics       Encourage continuation           of antibiotics    ----------------------------     -----------------------------        PCT >= 0.50 ng/mL                  PCT > 0.50 ng/mL               AND         increase in PCT                  Strongly encourage                                      initiation of antibiotics    Strongly encourage escalation           of antibiotics                                     -----------------------------                                           PCT <= 0.25 ng/mL                                                 OR                                        >  80% decrease in PCT                                      Discontinue / Do not initiate                                             antibiotics  Performed at [facility], [place] [address] [place], [place] [address]   Culture, blood (Routine X 2) w Reflex to ID Panel     Status: None (Preliminary result)   Collection Time: 02/24/22  3:38 AM   Specimen: BLOOD LEFT HAND  Result Value Ref Range   Specimen Description BLOOD LEFT HAND    Special Requests      BOTTLES DRAWN AEROBIC ONLY Blood Culture results may not be optimal due to an inadequate volume of blood received in culture bottles   Culture      NO GROWTH < 12 HOURS Performed at [facility] [address], [place], [place] [address]    Report Status PENDING   Culture, blood (Routine X 2) w Reflex to ID Panel     Status: None (Preliminary result)   Collection Time: 02/24/22  3:41 AM   Specimen: BLOOD  Result Value Ref Range   Specimen Description BLOOD LEFT ANTECUBITAL    Special Requests      BOTTLES DRAWN AEROBIC AND ANAEROBIC Blood Culture adequate volume    Culture      NO GROWTH < 12 HOURS Performed at [facility], [place] [address] [place], [place] [address]    Report Status PENDING   Acetaminophen level     Status: Abnormal   Collection Time: 02/24/22  4:54 AM  Result Value Ref Range   Acetaminophen (Tylenol), Serum <10 (L) 10 - 30 ug/mL    Comment: (NOTE) Therapeutic concentrations vary significantly. A range of 10-30 ug/mL  may be an effective concentration for many patients. However, some  are best treated at concentrations outside of this range. Acetaminophen concentrations >150 ug/mL at 4 hours after ingestion  and >50 ug/mL at 12 hours after ingestion are often associated with  toxic reactions.  Performed at [facility], [place] [address] [place], [place] [address]   Lipid panel     Status: Abnormal   Collection Time: 02/24/22  4:54 AM  Result Value Ref Range   Cholesterol 102 0 - 200 mg/dL   Triglycerides 49 <150 mg/dL   HDL 34 (L) >40 mg/dL   Total CHOL/HDL Ratio 3.0 RATIO   VLDL 10 0 - 40 mg/dL   LDL Cholesterol 58 0 - 99 mg/dL    Comment:        Total Cholesterol/HDL:CHD Risk Coronary Heart Disease Risk Table                     Men   Women  1/2 Average Risk   3.4   3.3  Average Risk       5.0   4.4  2 X Average Risk   9.6   7.1  3 X Average Risk  23.4   11.0        Use the calculated Patient Ratio above and the CHD Risk Table to determine the patient's CHD Risk.  ATP III CLASSIFICATION (LDL):  <100     mg/dL   Optimal  100-129  mg/dL   Near or Above                    Optimal  130-159  mg/dL   Borderline  160-189  mg/dL   High  >190     mg/dL   Very High Performed at [facility] [address] [place], [place] [address]   C-reactive protein     Status: Abnormal   Collection Time: 02/24/22  4:54 AM  Result Value Ref Range   CRP 7.4 (H) <1.0 mg/dL    Comment: Performed at [facility] [address] [place], [place] [address]  CK     Status: Abnormal   Collection Time:  02/24/22  4:54 AM  Result Value Ref Range   Total CK >50,000 (H) 49 - 397 U/L    Comment: RESULT CONFIRMED BY MANUAL DILUTION Performed at [facility], [place] [address] [place], [place] [address]   CBG monitoring, ED     Status: Abnormal   Collection Time: 02/24/22  7:47 AM  Result Value Ref Range   Glucose-Capillary 131 (H) 70 - 99 mg/dL    Comment: Glucose reference range applies only to samples taken after fasting for at least 8 hours.  Renal function panel     Status: Abnormal   Collection Time: 02/24/22 12:31 PM  Result Value Ref Range   Sodium 138 135 - 145 mmol/L   Potassium 5.7 (H) 3.5 - 5.1 mmol/L    Comment: HEMOLYSIS AT THIS LEVEL MAY AFFECT RESULT   Chloride 105 98 - 111 mmol/L   CO2 19 (L) 22 - 32 mmol/L   Glucose, Bld 95 70 - 99 mg/dL    Comment: Glucose reference range applies only to samples taken after fasting for at least 8 hours.   BUN 71 (H) 6 - 20 mg/dL   Creatinine, Ser 4.12 (H) 0.61 - 1.24 mg/dL   Calcium 6.8 (L) 8.9 - 10.3 mg/dL   Phosphorus 6.6 (H) 2.5 - 4.6 mg/dL   Albumin 2.7 (L) 3.5 - 5.0 g/dL   GFR, Estimated 17 (L) >60 mL/min    Comment: (NOTE) Calculated using the CKD-EPI Creatinine Equation (2021)    Anion gap 14 5 - 15    Comment: Performed at [facility] [address] [place], [place] [address]  CK     Status: Abnormal   Collection Time: 02/24/22 12:49 PM  Result Value Ref Range   Total CK >50,000 (H) 49 - 397 U/L    Comment: HEMOLYSIS AT THIS LEVEL MAY AFFECT RESULT RESULT CONFIRMED BY MANUAL DILUTION Performed at [facility], [place] [address], [place], [place] [address]     Recent Results (from the past 240 hour(s))  Culture, blood (Routine X 2) w Reflex to ID Panel     Status: None (Preliminary result)   Collection Time: 02/24/22  3:38 AM   Specimen: BLOOD LEFT HAND  Result Value Ref Range Status   Specimen Description BLOOD LEFT HAND  Final   Special Requests   Final    BOTTLES DRAWN AEROBIC ONLY Blood Culture  results may not be optimal due to an inadequate volume of blood received in culture bottles   Culture   Final    NO GROWTH < 12 HOURS Performed at [facility] [address] [place], [place] [address]  Report Status PENDING  Incomplete  Culture, blood (Routine X 2) w Reflex to ID Panel     Status: None (Preliminary result)   Collection Time: 02/24/22  3:41 AM   Specimen: BLOOD  Result Value Ref Range Status   Specimen Description BLOOD LEFT ANTECUBITAL  Final   Special Requests   Final    BOTTLES DRAWN AEROBIC AND ANAEROBIC Blood Culture adequate volume   Culture   Final    NO GROWTH < 12 HOURS Performed at [facility], [address], [place], [place] [address]    Report Status PENDING  Incomplete    Lipid Panel Recent Labs    02/24/22 0454  CHOL 102  TRIG 49  HDL 34*  CHOLHDL 3.0  VLDL 10  LDLCALC 58    Studies/Results: MR BRAIN WO CONTRAST  Result Date: 02/24/2022 CLINICAL DATA:  Altered mental status.  Found down. EXAM: MRI HEAD WITHOUT CONTRAST TECHNIQUE: Multiplanar, multiecho pulse sequences of the brain and surrounding structures were obtained without intravenous contrast. 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 No evidence of hemorrhage. No extra-axial fluid collection. No hydrocephalus. 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 Vascular: Normal flow voids. Skull and upper cervical spine: Normal marrow signal. Sinuses/Orbits: Negative. Other: There is a soft tissue hematoma along the midline frontal scalp. IMPRESSION: 1. 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 No hemorrhage or mass effect. 2. Soft tissue hematoma along the midline frontal scalp. Electronically Signed   By: Marin Roberts M.D.   On: 02/24/2022 10:10   CT  EXTREMITY LOWER RIGHT WO CONTRAST  Addendum Date: 02/24/2022   ADDENDUM REPORT: 02/24/2022 03:59 ADDENDUM: Discussed over the phone in detail with Dr. Marcello Moores at 3:51 a.m., 02/24/2022, with verbal acknowledgement of the key findings. Electronically Signed   By: Telford Nab M.D.   On: 02/24/2022 03:59   Result Date: 02/24/2022 CLINICAL DATA:  Severe rhabdomyolysis. Evaluate for compartment syndrome. EXAM: CT OF THE LOWER RIGHT EXTREMITY WITHOUT CONTRAST TECHNIQUE: Multidetector CT imaging of the entire right lower extremity was performed according to the standard protocol. RADIATION DOSE REDUCTION: This exam was performed according to the departmental dose-optimization program which includes automated exposure control, adjustment of the mA and/or kV according to patient size and/or use of iterative reconstruction technique. COMPARISON:  Limited comparison is available with a right hip CT from 03/31/2019. FINDINGS: Bones/Joint/Cartilage Normal bone mineralization. There is no evidence of fractures or aggressive bone lesions and no findings of right femoral head avascular necrosis. 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 No significant ankle or foot arthropathy is seen. No evidence of joint effusions or hemarthrosis. There is a small accessory navicular bone incidentally noted as well as a tiny os peroneum alongside the cuboid bone. Ligaments Suboptimally assessed by CT. Muscles and Tendons There is diffuse edema and swelling of the right gluteus medius muscle, the dorsal hip adductors in the proximal thigh and the distal aspect of the vastus medialis. The foreleg and plantar foot musculature are unremarkable without contrast. Area tendons are grossly intact, as visualized. Soft tissues No mass, soft tissue gas or localizing fluid collection is seen. There is scattered nonlocalizing fluid in the dorsal proximal thigh between the muscle  groups.  There is mild superficial edema in the thigh. IMPRESSION: 1. Diffuse edema and swelling of the right gluteus medius muscle, the dorsal hip adductors in the proximal thigh, and the distal aspect of the vastus medialis. 2. 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 3. No evidence of fractures or aggressive bone lesions. 4. Mild nonerosive degenerative changes of the right hip and lateral patellofemoral joint. Electronically Signed: By: Telford Nab M.D. On: 02/24/2022 02:26   CT Extrem Up Entire Arm R WO/CM  Addendum Date: 02/24/2022   ADDENDUM REPORT: 02/24/2022 02:27 ADDENDUM: Not mentioned above, there are coarse linear scar-like opacities in the right lung apex and right lower lung field. There is a 4 mm chronic nodule in right lower lung field which was seen on a 2008 abdomen pelvis CT and is unchanged. Electronically Signed   By: Telford Nab M.D.   On: 02/24/2022 02:27   Result Date: 02/24/2022 CLINICAL DATA:  Severe rhabdomyolysis. Evaluate for compartment syndrome findings. EXAM: CT OF THE UPPER RIGHT EXTREMITY WITHOUT CONTRAST TECHNIQUE: Multidetector CT imaging of the upper right extremity was performed according to the standard protocol. RADIATION DOSE REDUCTION: This exam was performed according to the departmental dose-optimization program which includes automated exposure control, adjustment of the mA and/or kV according to patient size and/or use of iterative reconstruction technique. COMPARISON:  None Available. FINDINGS: Bones/Joint/Cartilage There is normal bone mineralization without acute fractures. There is a cortical depression in the posterosuperior right humeral head suggesting an age-indeterminate Hill-Sachs impaction deformity such as due to an old anterior shoulder dislocation. 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 There is no evidence of joint effusions or hemarthrosis. No destructive bone lesion or aggressive periostitis is seen in the right upper extremity. Ligaments Suboptimally assessed by CT. Muscles and Tendons There are considerable beam hardening artifacts through the upper extremity due to the study having been performed with his arm by his side. This limits fine detail regarding the muscles and tendons. There is a grossly normal muscle bulk for the patient's age. Whether or not there are tendon abnormalities is indeterminate. Soft tissues No soft tissue gas or fluid collections are identified. IMPRESSION: 1. No soft tissue gas or focal fluid collections are identified. Absence of these findings however, should not delay surgical exploration if there is strong clinical concern for compartment syndrome with necrotizing fasciitis. 2. Cortical depression in the posterosuperior right humeral head suggesting an age-indeterminate Hill-Sachs impaction deformity such as due to an old anterior shoulder dislocation. 3. 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 No evidence of joint effusions or hemarthrosis. 4. No destructive bone lesion or aggressive periostitis in the right upper extremity. 5. Significant beam hardening artifacts through the upper extremity due to technique. This limits fine detail regarding the muscles and tendons. Electronically Signed: By: Telford Nab M.D. On: 02/24/2022 02:03   DG Pelvis 1-2 Views  Result Date: 02/23/2022 CLINICAL DATA:  Fall. EXAM: PELVIS - 1-2 VIEW COMPARISON:  None Available. FINDINGS: There is no evidence of pelvic fracture or diastasis. No pelvic bone lesions are seen. IMPRESSION: Negative. Electronically Signed   By: Ronney Asters M.D.   On: 02/23/2022 21:41   DG Chest Portable 1 View  Result Date: 02/23/2022 CLINICAL DATA:  Fall EXAM: PORTABLE CHEST 1 VIEW COMPARISON:  02/06/2022 FINDINGS: Heart and  mediastinal contours  are within normal limits. No focal opacities or effusions. No acute bony abnormality. IMPRESSION: No active disease. Electronically Signed   By: Rolm Baptise M.D.   On: 02/23/2022 21:11   CT HEAD CODE STROKE WO CONTRAST  Result Date: 02/23/2022 CLINICAL DATA:  Code stroke. Initial evaluation for stroke, right-sided deficits. EXAM: CT HEAD WITHOUT CONTRAST TECHNIQUE: Contiguous axial images were obtained from the base of the skull through the vertex without intravenous contrast. RADIATION DOSE REDUCTION: This exam was performed according to the departmental dose-optimization program which includes automated exposure control, adjustment of the mA and/or kV according to patient size and/or use of iterative reconstruction technique. COMPARISON:  Prior CT from 02/22/2019. FINDINGS: Brain: Cerebral volume within normal limits for age. 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 No acute large vessel territory infarct. No acute intracranial hemorrhage. No mass lesion or midline shift. No hydrocephalus or extra-axial fluid collection. Vascular: No asymmetric hyperdense vessel. Skull: Focal soft tissue thickening/swelling present at the central forehead, indeterminate (series 5, image 22). Calvarium intact. Sinuses/Orbits: Globes orbital soft tissues within normal limits. Paranasal sinuses and mastoid air cells are largely clear. Other: None. ASPECTS [facility] Stroke Program Early CT Score) - Ganglionic level infarction (caudate, lentiform nuclei, internal capsule, insula, M1-M3 cortex): 7 - Supraganglionic infarction (M4-M6 cortex): 3 Total score (0-10 with 10 being normal): 10 IMPRESSION: 1. No acute intracranial abnormality. 2. ASPECTS is 10. 3. 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 4. Focal soft tissue thickening and/or swelling at the central forehead. Correlation with  physical exam recommended. These results were communicated to Dr. Rory Percy at 8:42 pm on 02/23/2022 by text page via the [facility] messaging system. Electronically Signed   By: Jeannine Boga M.D.   On: 02/23/2022 20:45    Medications: Prior to Admission: (Not in a hospital admission)  Scheduled:  [START ON 02/25/2022]  stroke: early stages of recovery book   Does not apply Once   heparin  5,000 Units Subcutaneous Q8H   methylPREDNISolone acetate  40 mg Intramuscular Once   metoprolol tartrate  50 mg Oral BID   sodium zirconium cyclosilicate  10 g Oral TID   Continuous:  linezolid (ZYVOX) IV Stopped (02/24/22 1329)   piperacillin-tazobactam (ZOSYN)  IV Stopped (02/24/22 1916)   sodium bicarbonate 150 mEq in dextrose 5 % 1,150 mL infusion 200 mL/hr at 02/24/22 1551   Assessment: 51 year old with above past medical history presenting for evaluation of sudden onset of generalized weakness worse on the right after found down on the floor suspected for an extended period of time. Severe rhabdomyolysis along with AKI on labs. Was positive for cocaine, methamphetamine and benzodiazepine on UDS. Patient recalls using cocaine  the night before and then collapsing the next day being unable to get up.  - His creatinine was normal 2 weeks ago but on admission was 3.33.  His potassium which was also normal 2 weeks ago was 7.1 on admission. Ionized calcium was low at 0.81, AST elevated at 1214, ALT 700 - MRI brain reveals acute infarcts in the left cerebellum and in the corona radiata on the right. 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 No hemorrhage or mass effect. Soft tissue hematoma along the midline frontal scalp. 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  His two acute strokes in separate vascular territories could also be due to septic emboli.     Recommendations: - TTE to evaluate for mural thrombus or valvular vegetation. If negative, may need TEE.  - Blood cultures - MRA of head - Carotid ultrasound - Cardiac telemetry - Patient educated extensively on the dangers of drug abuse including infection, heart failure and stroke as well as sudden death - Severely hypertensive. Out of the permissive HTN time window. Discussed management options with primary team who are reinitiating beta blocker therapy, which patient was on at home but most likely missed doses of while immobilized. Also with PRN hydralazine. I have recommended clevidipine drip if these are ineffective.  - Hold off on ASA at this time given risk of hemorrhage if the strokes are due to septic emboli.  - Cannot obtain contrasted MRI to assess the acute DWI abnormalities for enhancement, given patient's AKI - Management of rhabdomyolysis per primary team.  - Stroke Team to follow in the AM   LOS: 0 days   Electronically signed: Dr. Kerney Elbe

## 2022-02-24 NOTE — Consult Note (Addendum)
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 hemarthrosis. No destructive bone lesion or aggressive periostitis is seen in the right upper extremity. Ligaments Suboptimally assessed by CT. Muscles and Tendons There are considerable beam hardening artifacts through the upper extremity due to the study having been performed with his arm by his side. This limits fine detail regarding the muscles and tendons. There is a grossly normal muscle bulk for the patient's age. Whether or not there are tendon abnormalities is indeterminate. Soft tissues No soft tissue gas or fluid  collections are identified. IMPRESSION: 1. No soft tissue gas or focal fluid collections are identified. Absence of these findings however, should not delay surgical exploration if there is strong clinical concern for compartment syndrome with necrotizing fasciitis. 2. Cortical depression in the posterosuperior right humeral head suggesting an age-indeterminate Hill-Sachs impaction deformity such as due to an old anterior shoulder dislocation. 3. 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 No evidence of joint effusions or hemarthrosis. 4. No destructive bone lesion or aggressive periostitis in the right upper extremity. 5. Significant beam hardening artifacts through the upper extremity due to technique. This limits fine detail regarding the muscles and tendons. Electronically Signed: By: Telford Nab M.D. On: 02/24/2022 02:03   DG Pelvis 1-2 Views  Result Date: 02/23/2022 CLINICAL DATA:  Fall. EXAM: PELVIS - 1-2 VIEW COMPARISON:  None Available. FINDINGS: There is no evidence of pelvic fracture or diastasis. No pelvic bone lesions are seen. IMPRESSION: Negative. Electronically Signed   By: Ronney Asters M.D.   On: 02/23/2022 21:41   DG Chest Portable 1 View  Result Date: 02/23/2022 CLINICAL DATA:  Fall EXAM: PORTABLE CHEST 1 VIEW COMPARISON:  02/06/2022 FINDINGS: Heart and mediastinal contours are within normal limits. No focal opacities or effusions. No acute bony abnormality. IMPRESSION: No active disease. Electronically Signed   By: Rolm Baptise M.D.   On: 02/23/2022 21:11   CT HEAD CODE STROKE WO CONTRAST  Result Date: 02/23/2022 CLINICAL DATA:  Code stroke. Initial evaluation for stroke, right-sided deficits. EXAM: CT HEAD WITHOUT CONTRAST TECHNIQUE: Contiguous axial images were obtained from the base of the skull through the vertex without intravenous contrast. RADIATION DOSE REDUCTION: This exam was performed according to the departmental  dose-optimization program which includes automated exposure control, adjustment of the mA and/or kV according to patient size and/or use of iterative reconstruction technique. COMPARISON:  Prior CT from 02/22/2019. FINDINGS: Brain: Cerebral volume within normal limits for age. 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 No acute large vessel territory infarct. No acute intracranial hemorrhage. No mass lesion or midline shift. No  hydrocephalus or extra-axial fluid collection. Vascular: No asymmetric hyperdense vessel. Skull: Focal soft tissue thickening/swelling present at the central forehead, indeterminate (series 5, image 22). Calvarium intact. Sinuses/Orbits: Globes orbital soft tissues within normal limits. Paranasal sinuses and mastoid air cells are largely clear. Other: None. ASPECTS [facility] Stroke Program Early CT Score) - Ganglionic level infarction (caudate, lentiform nuclei, internal capsule, insula, M1-M3 cortex): 7 - Supraganglionic infarction (M4-M6 cortex): 3 Total score (0-10 with 10 being normal): 10 IMPRESSION: 1. No acute intracranial abnormality. 2. ASPECTS is 10. 3. 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 4. Focal soft tissue thickening and/or swelling at the central forehead. Correlation with physical exam recommended. These results were communicated to Dr. Wilford Corner at 8:42 pm on 02/23/2022 by text page via the [facility] messaging system. Electronically Signed   By: Rise Mu M.D.   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  Making urine but may not be enough to get by without dialysis.  I discussed this with the pt and he is in agreement if we need to proceed - ordering another 1 L NS bolus - continue bicarb gtt - serial BMP checks - place Foley - may need RRT - aggressive K management as below  2.   Hyperkalemia: rhabdo + losartan + NSAIDs  - calcium, insulin/ dextrose in ED  - started on bicarb gtt  - albuterol 10 mg neb ordered  - lokelma TID  - if no improvement and UOP not great will do RRT  3.  Hypocalcemia:  - repletion as above  4.  HTN  - holding losartan  5.  Polysubstance abuse  - CIWA protocol  6.  Elevated LFTS:  - likely d/t rhabdo, consider RUQ imaging, exam benign, per primary  7.  Dispo: admitted  Bufford Buttner 02/24/2022, 10:28 AM

## 2022-02-24 NOTE — Progress Notes (Signed)
PT Cancellation Note  Patient Details Name: Franklin Anderson MRN: [id number] DOB: Dec 29, 1970   Cancelled Treatment:    Reason Eval/Treat Not Completed: Pain limiting ability to participate; patient in distress and reporting 10/10 pain in R groin.  Assisted to reposition for comfort with wife present.  Will attempt again for mobility evaluation another day.    Reginia Naas 02/24/2022, 3:30 PM Magda Kiel, PT Acute Rehabilitation Services Office:[phone] 02/24/2022

## 2022-02-24 NOTE — Progress Notes (Addendum)
Pharmacy Antibiotic Note  Franklin Anderson is a 51 y.o. male admitted on 02/23/2022 with AMS, and R sided weakness after a fall overnight. Septic on admit with suspected septic R hip. CT scan revealed severe muscle inflammation concerning for possible necrotizing fasciitis. Pharmacy has been consulted for linezolid and zosyn dosing. Pt currently has an AKI with Scr 3.5 (BL ~0.8)  Plan: Zosyn 3.375g IV q8h (4 hour infusion). Linezolid 600mg  IV Q12h Monitor renal function, resolution of infection and cultures for pathogen directed therapy.  Height: 6' (182.9 cm) Weight: 56.7 kg (125 lb) IBW/kg (Calculated) : 77.6  Temp (24hrs), Avg:99.4 F (37.4 C), Min:98.6 F (37 C), Max:100.1 F (37.8 C)  Recent Labs  Lab 02/23/22 2008 02/23/22 2016 02/23/22 2301 09-Mar-2022 0205 03-09-22 0338  WBC 16.4*  --   --  15.6* 16.0*  CREATININE 3.33* 3.40* 3.40* 3.44* 3.53*    Estimated Creatinine Clearance: 19.9 mL/min (A) (by C-G formula based on SCr of 3.53 mg/dL (H)).    Allergies  Allergen Reactions   Sulfa Antibiotics Anaphylaxis   Penicillins Other (See Comments)    "Near death" reaction per pt 03-09-22, could not elaborate.    Antimicrobials this admission: Vanc 10/30  Zosyn 03-10-23 >> Linezolid 10-Mar-2023 >>   Dose adjustments this admission: N/a  Microbiology results: 03/10/23 BCx: collected 10/30 UCx: collected  Thank you for allowing pharmacy to be a part of this patient's care.  Titus Dubin, PharmD PGY1 Pharmacy Resident 2022-03-09 8:11 AM

## 2022-02-24 NOTE — ED Provider Notes (Signed)
Patient has been boarding in the emergency department.  He is awaiting admission.  Patient has multiple acute issues at this time including acute renal failure, rhabdomyolysis.  Multiple service lines have been consulted for this patient.  He is going to the stepdown unit June and his right leg was performed which revealed edema and swelling in the right gluteus.  On exam patient has tenderness and firmness noted to this region.  There is no crepitus.  Distal pulses are intact.  The rest of his leg is soft to palpation and no discoloration.  D/w Dr. Gilman Schmidt who is the hospitalist.  She has consulted orthopedist Dr. Stann Mainland about this patient.  In his opinion, there is no emergent indication for operative management.  Plan to continue to treat his underlying medical conditions including rhabdomyolysis.   Ripley Fraise, MD 02/24/22 [phone]

## 2022-02-24 NOTE — Progress Notes (Signed)
PROGRESS NOTE   Franklin Anderson  [id number] DOB: 30-Mar-1971 DOA: 02/23/2022 PCP: Antony Blackbird, MD  Brief Narrative:  51 year old white male ?  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--hospitalization for 58 days 10/27-->04/16/19 10/30 found down covered in his own feces with altered mental status-code stroke called and canceled--apparently had been down for over 12 hours Work-up revealed concern of right septic hip with severe inflammation and early necrotizing fasciitis-Ortho consulted not recommending any intervention at this time Also found with hyperkalemia in the setting of rhabdo  Potassium 7.1 bicarb 22 BUN/creatinine 56/3.3 (baseline 20/0.8) AST/ALT 1214/700 Calcium 0.8 CRP 7.4 procalcitonin 18 WBC 16 X-ray left hip   [-], CXR negative, CT of right hip did not confirm any fluid or dislocation or arthritis Tox screen positive for amphetamine benzo cocaine   Hospital-Problem based course  Unresponsiveness in setting of drug use Has resolved patient is coherent now Counseled against illicit substance abuse positive drug  Initial concern for code stroke MRI brain is pending as per neurology recommendation although it is unlikely that he has had a stroke based on exam-he is moving limbs equally but does have discomfort in right hip likely from being found down Start soft diet as he is coherent and probably will pass swallow screen Right hip pain [prior hospitalization for discitis over 58 days in 2020 with bacteremia] CT scan does not reveal any overt pathology other than mild swelling--Dr. Stann Mainland of orthopedics evaluated patient and did not feel there was any surgical intervention warranted He has some induration of the skin and discomfort when I see him and  do not see any redness only induration/edema suggestive of him being down start him on Oxy IR 5 every 4 as needed moderate pain and continue Dilaudid 1 mg every 4 as needed for severe pain-it will need to be explained to him that we will not be discharging him on opiates given his substance abuse history I think it is reasonable to continue linezolid and Zosyn at this time for at least another day --follow cultures before discontinuation-  do not see any acute infectious indication and would stop if work-up negative If bacteria is positive would get work-up with echo  Severe rhabdomyolysis, AKI and electrolyte abnormalities CK still above 50,000--hydrate copiously with bicarb at 200 cc/h stop normal saline Hyperkalemia secondary to AKI--given Lokelma 10 X1-we will give 3 times daily until can be seen by renal AKI on admit--creatinine has not really changed continue copious fluids expect will take some time to resolve-prior to admission losartan 50 and ibuprofen/Naprosyn and Goody powders held Hypocalcemia 6.2 on admit--repeat dose of calcium gluconate but give 2 g dose Altered LFTs--likely from rhabdo and treatment is supportive May require renal replacement therapy-nephrology has been consulted  BMI 16 in the setting of drug use A1c 5.7 Only monitor would not aggressively control given low BMI  Depression Holding BuSpar 10 twice daily for now Resume when kidneys are better   DVT prophylaxis: Heparin Code Status: Full Family Communication: None Disposition:  Status is: Inpatient Remains inpatient appropriate because:   Severe electrolyte abnormalities needing close monitoring and further work-up for fall   Consultants:  Orthopedics Critical care Renal  Procedures:   Antimicrobials: Currently linezolid and Zosyn   Subjective: Awake coherent has moderate to severe right hip pain is able to move  all Xarelto 4 limbs he is oriented to place person  Objective: Vitals:    02/24/22 0315 02/24/22 0639 02/24/22 0700 02/24/22 0715  BP: (!) 167/106 (!) 164/105 (!) 166/111 (!) 165/118  Pulse: (!) 101 (!) 102 (!) 109 (!) 108  Resp: 16 15 16 18   Temp: 98.6 F (37 C)     TempSrc: Oral     SpO2: 92% 96% 91% 91%  Weight:      Height:        Intake/Output Summary (Last 24 hours) at 02/24/2022 0725 Last data filed at 02/24/2022 0631 Gross per 24 hour  Intake 2750 ml  Output --  Net 2750 ml   Filed Weights   02/23/22 2046  Weight: 56.7 kg    Examination:  EOMI NCAT no focal deficit moving all 4 limbs equally other than right hip secondary to pain and swelling-I do not appreciate any significant redness of the area or any fluctuance on my exam Chest is clear no rales no rhonchi S1-S2 no murmur no rub no gallop No icterus Multiple tattoos Other than hip swelling no other edema  Data Reviewed: personally reviewed   CBC    Component Value Date/Time   WBC 16.0 (H) 02/24/2022 0338   RBC 5.20 02/24/2022 0338   HGB 17.2 (H) 02/24/2022 0338   HGB 14.3 01/01/2022 1536   HCT 50.2 02/24/2022 0338   HCT 41.4 01/01/2022 1536   PLT 283 02/24/2022 0338   PLT 237 01/01/2022 1536   MCV 96.5 02/24/2022 0338   MCV 94 01/01/2022 1536   MCH 33.1 02/24/2022 0338   MCHC 34.3 02/24/2022 0338   RDW 12.5 02/24/2022 0338   RDW 12.1 01/01/2022 1536   LYMPHSABS 1.0 02/23/2022 2008   LYMPHSABS 2.2 01/01/2022 1536   MONOABS 1.0 02/23/2022 2008   EOSABS 0.0 02/23/2022 2008   EOSABS 0.2 01/01/2022 1536   BASOSABS 0.0 02/23/2022 2008   BASOSABS 0.0 01/01/2022 1536      Latest Ref Rng & Units 02/24/2022    3:38 AM 02/24/2022    2:52 AM 02/24/2022    2:05 AM  CMP  Glucose 70 - 99 mg/dL 126   111   BUN 6 - 20 mg/dL 62   63   Creatinine 0.61 - 1.24 mg/dL 3.53   3.44   Sodium 135 - 145 mmol/L 138  137  135   Potassium 3.5 - 5.1 mmol/L 6.1  5.7  5.7   Chloride 98 - 111 mmol/L 107   105   CO2 22 - 32 mmol/L 18   17   Calcium 8.9 - 10.3 mg/dL 6.5   6.2   Total  Protein 6.5 - 8.1 g/dL 6.2   5.7   Total Bilirubin 0.3 - 1.2 mg/dL 0.7   0.6   Alkaline Phos 38 - 126 U/L 89   78   AST 15 - 41 U/L 1,173   1,134   ALT 0 - 44 U/L 627   596      Radiology Studies: CT EXTREMITY LOWER RIGHT WO CONTRAST  Addendum Date: 02/24/2022   ADDENDUM REPORT: 02/24/2022 03:59 ADDENDUM: Discussed over the phone in detail with Dr. Marcello Moores at 3:51 a.m., 02/24/2022, with verbal acknowledgement of the key findings. Electronically Signed   By: Telford Nab M.D.   On: 02/24/2022 03:59   Result Date: 02/24/2022 CLINICAL DATA:  Severe rhabdomyolysis. Evaluate for compartment syndrome. EXAM: CT OF THE LOWER RIGHT EXTREMITY WITHOUT CONTRAST TECHNIQUE: Multidetector CT imaging of the entire right  lower extremity was performed according to the standard protocol. RADIATION DOSE REDUCTION: This exam was performed according to the departmental dose-optimization program which includes automated exposure control, adjustment of the mA and/or kV according to patient size and/or use of iterative reconstruction technique. COMPARISON:  Limited comparison is available with a right hip CT from 03/31/2019. FINDINGS: Bones/Joint/Cartilage Normal bone mineralization. There is no evidence of fractures or aggressive bone lesions and no findings of right femoral head avascular necrosis. 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 No significant ankle or foot arthropathy is seen. No evidence of joint effusions or hemarthrosis. There is a small accessory navicular bone incidentally noted as well as a tiny os peroneum alongside the cuboid bone. Ligaments Suboptimally assessed by CT. Muscles and Tendons There is diffuse edema and swelling of the right gluteus medius muscle, the dorsal hip adductors in the proximal thigh and the distal aspect of the vastus medialis. The foreleg and plantar foot musculature are unremarkable without contrast. Area  tendons are grossly intact, as visualized. Soft tissues No mass, soft tissue gas or localizing fluid collection is seen. There is scattered nonlocalizing fluid in the dorsal proximal thigh between the muscle groups. There is mild superficial edema in the thigh. IMPRESSION: 1. Diffuse edema and swelling of the right gluteus medius muscle, the dorsal hip adductors in the proximal thigh, and the distal aspect of the vastus medialis. 2. 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 3. No evidence of fractures or aggressive bone lesions. 4. Mild nonerosive degenerative changes of the right hip and lateral patellofemoral joint. Electronically Signed: By: Almira Bar M.D. On: 02/24/2022 02:26   CT Extrem Up Entire Arm R WO/CM  Addendum Date: 02/24/2022   ADDENDUM REPORT: 02/24/2022 02:27 ADDENDUM: Not mentioned above, there are coarse linear scar-like opacities in the right lung apex and right lower lung field. There is a 4 mm chronic nodule in right lower lung field which was seen on a 2008 abdomen pelvis CT and is unchanged. Electronically Signed   By: Almira Bar M.D.   On: 02/24/2022 02:27   Result Date: 02/24/2022 CLINICAL DATA:  Severe rhabdomyolysis. Evaluate for compartment syndrome findings. EXAM: CT OF THE UPPER RIGHT EXTREMITY WITHOUT CONTRAST TECHNIQUE: Multidetector CT imaging of the upper right extremity was performed according to the standard protocol. RADIATION DOSE REDUCTION: This exam was performed according to the departmental dose-optimization program which includes automated exposure control, adjustment of the mA and/or kV according to patient size and/or use of iterative reconstruction technique. COMPARISON:  None Available. FINDINGS: Bones/Joint/Cartilage There is normal bone mineralization without acute fractures. There is a cortical depression in the posterosuperior right humeral  head suggesting an age-indeterminate Hill-Sachs impaction deformity such as due to an old anterior shoulder dislocation. 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 There is no evidence of joint effusions or hemarthrosis. No destructive bone lesion or aggressive periostitis is seen in the right upper extremity. Ligaments Suboptimally assessed by CT. Muscles and Tendons There are considerable beam hardening artifacts through the upper extremity due to the study having been performed with his arm by his side. This limits fine detail regarding the muscles and tendons. There is a grossly normal muscle bulk for the patient's age. Whether or not there are tendon abnormalities is indeterminate. Soft tissues No soft  tissue gas or fluid collections are identified. IMPRESSION: 1. No soft tissue gas or focal fluid collections are identified. Absence of these findings however, should not delay surgical exploration if there is strong clinical concern for compartment syndrome with necrotizing fasciitis. 2. Cortical depression in the posterosuperior right humeral head suggesting an age-indeterminate Hill-Sachs impaction deformity such as due to an old anterior shoulder dislocation. 3. 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 No evidence of joint effusions or hemarthrosis. 4. No destructive bone lesion or aggressive periostitis in the right upper extremity. 5. Significant beam hardening artifacts through the upper extremity due to technique. This limits fine detail regarding the muscles and tendons. Electronically Signed: By: Telford Nab M.D. On: 02/24/2022 02:03   DG Pelvis 1-2 Views  Result Date: 02/23/2022 CLINICAL DATA:  Fall. EXAM: PELVIS - 1-2 VIEW COMPARISON:  None Available. FINDINGS: There is no evidence of pelvic fracture or diastasis. No pelvic bone lesions are seen. IMPRESSION: Negative. Electronically  Signed   By: Ronney Asters M.D.   On: 02/23/2022 21:41   DG Chest Portable 1 View  Result Date: 02/23/2022 CLINICAL DATA:  Fall EXAM: PORTABLE CHEST 1 VIEW COMPARISON:  02/06/2022 FINDINGS: Heart and mediastinal contours are within normal limits. No focal opacities or effusions. No acute bony abnormality. IMPRESSION: No active disease. Electronically Signed   By: Rolm Baptise M.D.   On: 02/23/2022 21:11   CT HEAD CODE STROKE WO CONTRAST  Result Date: 02/23/2022 CLINICAL DATA:  Code stroke. Initial evaluation for stroke, right-sided deficits. EXAM: CT HEAD WITHOUT CONTRAST TECHNIQUE: Contiguous axial images were obtained from the base of the skull through the vertex without intravenous contrast. RADIATION DOSE REDUCTION: This exam was performed according to the departmental dose-optimization program which includes automated exposure control, adjustment of the mA and/or kV according to patient size and/or use of iterative reconstruction technique. COMPARISON:  Prior CT from 02/22/2019. FINDINGS: Brain: Cerebral volume within normal limits for age. 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 No acute large vessel territory infarct. No acute intracranial hemorrhage. No mass lesion or midline shift. No hydrocephalus or extra-axial fluid collection. Vascular: No asymmetric hyperdense vessel. Skull: Focal soft tissue thickening/swelling present at the central forehead, indeterminate (series 5, image 22). Calvarium intact. Sinuses/Orbits: Globes orbital soft tissues within normal limits. Paranasal sinuses and mastoid air cells are largely clear. Other: None. ASPECTS [facility] Stroke Program Early CT Score) - Ganglionic level infarction (caudate, lentiform nuclei, internal capsule, insula, M1-M3 cortex): 7 - Supraganglionic infarction (M4-M6 cortex): 3 Total score (0-10 with 10 being normal): 10 IMPRESSION: 1. No acute intracranial abnormality. 2. ASPECTS is  10. 3. 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 4. Focal soft tissue thickening and/or swelling at the central forehead. Correlation with physical exam recommended. These results were communicated to Dr. Rory Percy at 8:42 pm on 02/23/2022 by text page via the [facility] messaging system. Electronically Signed   By: Jeannine Boga M.D.   On: 02/23/2022 20:45     Scheduled Meds:  [START ON 02/25/2022]  stroke: early stages of recovery book   Does not apply Once   heparin  5,000 Units Subcutaneous Q8H   LORazepam  0.5 mg Intravenous Once   methylPREDNISolone acetate  40 mg Intramuscular Once   sodium zirconium cyclosilicate  10 g Oral Once   vancomycin variable dose per unstable renal function (pharmacist  dosing)   Does not apply See admin instructions   Continuous Infusions:  lactated ringers Stopped (02/24/22 0650)   sodium bicarbonate 150 mEq in dextrose 5 % 1,150 mL infusion 125 mL/hr at 02/24/22 0250   vancomycin Stopped (02/24/22 0631)     LOS: 0 days   Time spent: [place], MD [facility] To contact the attending provider between 7A-7P or the covering provider during after hours 7P-7A, please log into the web site [email or web address] and access using universal [place] password for that web site. If you do not have the password, please call the hospital operator.  02/24/2022, 7:25 AM

## 2022-02-25 ENCOUNTER — Inpatient Hospital Stay (HOSPITAL_COMMUNITY): Payer: Self-pay

## 2022-02-25 ENCOUNTER — Encounter (HOSPITAL_COMMUNITY): Payer: Self-pay | Admitting: Internal Medicine

## 2022-02-25 DIAGNOSIS — I639 Cerebral infarction, unspecified: Secondary | ICD-10-CM

## 2022-02-25 DIAGNOSIS — I6389 Other cerebral infarction: Secondary | ICD-10-CM

## 2022-02-25 DIAGNOSIS — I6340 Cerebral infarction due to embolism of unspecified cerebral artery: Secondary | ICD-10-CM

## 2022-02-25 LAB — COMPREHENSIVE METABOLIC PANEL
ALT: 440 U/L — ABNORMAL HIGH (ref 0–44)
AST: 645 U/L — ABNORMAL HIGH (ref 15–41)
Albumin: 2.1 g/dL — ABNORMAL LOW (ref 3.5–5.0)
Alkaline Phosphatase: 62 U/L (ref 38–126)
Anion gap: 15 (ref 5–15)
BUN: 74 mg/dL — ABNORMAL HIGH (ref 6–20)
CO2: 23 mmol/L (ref 22–32)
Calcium: 6.4 mg/dL — CL (ref 8.9–10.3)
Chloride: 95 mmol/L — ABNORMAL LOW (ref 98–111)
Creatinine, Ser: 5.41 mg/dL — ABNORMAL HIGH (ref 0.61–1.24)
GFR, Estimated: 12 mL/min — ABNORMAL LOW (ref >60)
Glucose, Bld: 162 mg/dL — ABNORMAL HIGH (ref 70–99)
Potassium: 3.6 mmol/L (ref 3.5–5.1)
Sodium: 133 mmol/L — ABNORMAL LOW (ref 135–145)
Total Bilirubin: 0.6 mg/dL (ref 0.3–1.2)
Total Protein: 4.2 g/dL — ABNORMAL LOW (ref 6.5–8.1)

## 2022-02-25 LAB — CBC
HCT: 38.1 % — ABNORMAL LOW (ref 39.0–52.0)
Hemoglobin: 13.4 g/dL (ref 13.0–17.0)
MCH: 32.4 pg (ref 26.0–34.0)
MCHC: 35.2 g/dL (ref 30.0–36.0)
MCV: 92.3 fL (ref 80.0–100.0)
Platelets: 198 10*3/uL (ref 150–400)
RBC: 4.13 MIL/uL — ABNORMAL LOW (ref 4.22–5.81)
RDW: 12.5 % (ref 11.5–15.5)
WBC: 9.8 10*3/uL (ref 4.0–10.5)
nRBC: 0 % (ref 0.0–0.2)

## 2022-02-25 LAB — CK: Total CK: 27858 U/L — ABNORMAL HIGH (ref 49–397)

## 2022-02-25 LAB — URINE CULTURE: Culture: NO GROWTH

## 2022-02-25 LAB — ECHOCARDIOGRAM COMPLETE
Area-P 1/2: 3.06 cm2
Calc EF: 41.8 %
Height: 72 in
MV VTI: 2.88 cm2
S' Lateral: 4 cm
Single Plane A2C EF: 44 %
Single Plane A4C EF: 43.3 %
Weight: 2000 oz

## 2022-02-25 MED ORDER — CLONIDINE HCL 0.1 MG PO TABS: 0.1000 mg | ORAL_TABLET | Freq: Three times a day (TID) | ORAL | Status: DC

## 2022-02-25 MED ORDER — SODIUM BICARBONATE 8.4 % IV SOLN
INTRAVENOUS | Status: DC
  Filled 2022-02-25 (×6): qty 1000

## 2022-02-25 MED ORDER — AMLODIPINE BESYLATE 10 MG PO TABS
10.0000 mg | ORAL_TABLET | Freq: Every day | ORAL | Status: DC
  Administered 2022-02-25 – 2022-03-05 (×9): 10 mg via ORAL
  Filled 2022-02-25 (×9): qty 1

## 2022-02-25 MED ORDER — CALCIUM GLUCONATE-NACL 1-0.675 GM/50ML-% IV SOLN
1.0000 g | Freq: Once | INTRAVENOUS | Status: AC
  Administered 2022-02-25: 1000 mg via INTRAVENOUS
  Filled 2022-02-25: qty 50

## 2022-02-25 MED ORDER — CLONIDINE HCL 0.1 MG PO TABS
0.1000 mg | ORAL_TABLET | Freq: Two times a day (BID) | ORAL | Status: DC
  Administered 2022-02-25 – 2022-03-01 (×9): 0.1 mg via ORAL
  Filled 2022-02-25 (×8): qty 1

## 2022-02-25 MED ORDER — ASPIRIN 325 MG PO TBEC
325.0000 mg | DELAYED_RELEASE_TABLET | Freq: Every day | ORAL | Status: DC
  Administered 2022-02-25 – 2022-03-05 (×9): 325 mg via ORAL
  Filled 2022-02-25 (×9): qty 1

## 2022-02-25 MED ORDER — PIPERACILLIN-TAZOBACTAM IN DEX 2-0.25 GM/50ML IV SOLN
2.2500 g | Freq: Three times a day (TID) | INTRAVENOUS | Status: DC
  Administered 2022-02-25 – 2022-02-27 (×6): 2.25 g via INTRAVENOUS
  Filled 2022-02-25 (×8): qty 50

## 2022-02-25 MED ORDER — HYDRALAZINE HCL 20 MG/ML IJ SOLN
10.0000 mg | INTRAMUSCULAR | Status: DC | PRN
  Administered 2022-02-27 – 2022-03-02 (×5): 10 mg via INTRAVENOUS
  Filled 2022-02-25 (×6): qty 1

## 2022-02-25 MED ORDER — AMLODIPINE BESYLATE 10 MG PO TABS: 10.0000 mg | ORAL_TABLET | Freq: Every day | ORAL | Status: DC

## 2022-02-25 NOTE — ED Notes (Signed)
Spoke with hospitalist and Ortho to see in the AM

## 2022-02-25 NOTE — Progress Notes (Signed)
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  Making urine but may not be enough to get by without dialysis.  I discussed this with the pt and he is in agreement if we need to proceed - continue bicarb gtt - serial BMP checks - Foley - may need RRT - aggressive K management as below--> now normalized - if Cr continues to uptrend we may need to do HD but for now no indication   2.  Hyperkalemia: rhabdo + losartan + NSAIDs             - calcium, insulin/ dextrose in ED             - bicarb gtt             - s/p albuterol 10 mg neb             - lokelma TID--> holding today             - if no improvement and UOP not great will do RRT   3.  Hypocalcemia:             - repletion as above   4.  HTN             - holding losartan   5.  Polysubstance abuse             - CIWA protocol   6.  Elevated LFTS:             - likely d/t rhabdo, consider RUQ imaging, exam benign, per primary  7.  Acute CVA  - workup per neuro   8.  Dispo: admitted  Subjective:    Seen in room.  Cr cotninues to trend upwards but UOP OK.  K is getting better.  Ca repleted.   Objective:   BP (!) 192/108 (BP Location: Right Arm)   Pulse 89   Temp 98.2 F (36.8 C) (Oral)   Resp 18   Ht 6' (1.829 m)   Wt 56.7 kg   SpO2 100%   BMI 16.95 kg/m   Intake/Output Summary (Last 24 hours) at 02/25/2022 1322 Last data filed at 02/25/2022 8119 Gross per 24 hour  Intake 1351.2 ml  Output 600 ml  Net 751.2 ml   Weight change:   Physical Exam: GEN: awake and alert, sitting in bed HEENT EOMI PERRL, poor dentition NECK flat neck veins PULM clear CV tachycardic ABD thin EXT no edema NEURO AAO x 3 SKIN dry, ruddy complexion, mult tattooos MSK severe R hip pain, can't move it, + swelling today  Imaging: VAS [place] LOWER EXTREMITY VENOUS (DVT)  Result Date: 02/25/2022  Lower Venous DVT Study Patient Name:  Franklin Anderson  Date of Exam:    02/25/2022 Medical Rec #: [id number]       Accession #:    [id number] Date of Birth: [birth year]      Patient Gender: M Patient Age:   51 years Exam Location:  [facility] Procedure:      VAS [place] LOWER EXTREMITY VENOUS (DVT) Referring Phys: Scheryl Marten XU --------------------------------------------------------------------------------  Indications: Stroke.  Comparison Study: prior 03/01/19 Performing Technologist: [place] Ponder RVS  Examination Guidelines: A complete evaluation includes B-mode imaging, spectral Doppler, color Doppler, and power Doppler as needed of all accessible portions of each vessel. Bilateral testing is considered an integral part of a complete examination. Limited examinations for reoccurring indications may be performed  as noted. The reflux portion of the exam is performed with the patient in reverse Trendelenburg.  +--------+---------------+---------+-----------+----------+--------------------+ RIGHT   CompressibilityPhasicitySpontaneityPropertiesThrombus Aging       +--------+---------------+---------+-----------+----------+--------------------+ CFV     Full           Yes      Yes                                       +--------+---------------+---------+-----------+----------+--------------------+ SFJ     Full                                                              +--------+---------------+---------+-----------+----------+--------------------+ FV Prox Full                                                              +--------+---------------+---------+-----------+----------+--------------------+ FV Mid  Full           Yes      Yes                                       +--------+---------------+---------+-----------+----------+--------------------+ FV      Full                                                              Distal                                                                     +--------+---------------+---------+-----------+----------+--------------------+ PFV     Full                                                              +--------+---------------+---------+-----------+----------+--------------------+ POP     Full           Yes      Yes                                       +--------+---------------+---------+-----------+----------+--------------------+ PTV     Full           Yes      Yes                  patent by color  doppler              +--------+---------------+---------+-----------+----------+--------------------+ PERO    Full           Yes      Yes                  patent by color                                                           doppler              +--------+---------------+---------+-----------+----------+--------------------+   +---------+---------------+---------+-----------+----------+--------------+ LEFT     CompressibilityPhasicitySpontaneityPropertiesThrombus Aging +---------+---------------+---------+-----------+----------+--------------+ CFV      Full           Yes      Yes                                 +---------+---------------+---------+-----------+----------+--------------+ SFJ      Full                                                        +---------+---------------+---------+-----------+----------+--------------+ FV Prox  Full                                                        +---------+---------------+---------+-----------+----------+--------------+ FV Mid   Full                                                        +---------+---------------+---------+-----------+----------+--------------+ FV DistalFull                                                        +---------+---------------+---------+-----------+----------+--------------+ PFV      Full                                                         +---------+---------------+---------+-----------+----------+--------------+ POP      Full           Yes      Yes                                 +---------+---------------+---------+-----------+----------+--------------+ PTV      Full                                                        +---------+---------------+---------+-----------+----------+--------------+  PERO     Full                                                        +---------+---------------+---------+-----------+----------+--------------+     Summary: BILATERAL: - No evidence of deep vein thrombosis seen in the lower extremities, bilaterally. -No evidence of popliteal cyst, bilaterally.   *See table(s) above for measurements and observations. Electronically signed by Sherald Hess MD on 02/25/2022 at 12:07:47 PM.    Final    VAS US CAROTID  Result Date: 02/25/2022 Carotid Arterial Duplex Study Patient Name:  Franklin Anderson  Date of Exam:   02/25/2022 Medical Rec #: [id number]       Accession #:    [id number] Date of Birth: 19-Sep-1970      Patient Gender: M Patient Age:   69 years Exam Location:  [facility] Procedure:      VAS US CAROTID Referring Phys: Marvel Plan --------------------------------------------------------------------------------  Indications:       CVA. Risk Factors:      Hypertension. Comparison Study:  no prior Performing Technologist: [place] Ponder RVS  Examination Guidelines: A complete evaluation includes B-mode imaging, spectral Doppler, color Doppler, and power Doppler as needed of all accessible portions of each vessel. Bilateral testing is considered an integral part of a complete examination. Limited examinations for reoccurring indications may be performed as noted.  Right Carotid Findings: +----------+--------+--------+--------+------------------+--------+           PSV cm/sEDV cm/sStenosisPlaque DescriptionComments  +----------+--------+--------+--------+------------------+--------+ CCA Prox  90      29              heterogenous               +----------+--------+--------+--------+------------------+--------+ CCA Distal57      24              heterogenous               +----------+--------+--------+--------+------------------+--------+ ICA Prox  53      21              heterogenous               +----------+--------+--------+--------+------------------+--------+ ICA Distal71      30                                         +----------+--------+--------+--------+------------------+--------+ ECA       81      27                                         +----------+--------+--------+--------+------------------+--------+ +----------+--------+-------+--------+-------------------+           PSV cm/sEDV cmsDescribeArm Pressure (mmHG) +----------+--------+-------+--------+-------------------+ Subclavian89                                         +----------+--------+-------+--------+-------------------+ +---------+--------+--+--------+--+---------+ VertebralPSV cm/s41EDV cm/s18Antegrade +---------+--------+--+--------+--+---------+  Left Carotid Findings: +----------+--------+--------+--------+------------------+--------+           PSV cm/sEDV cm/sStenosisPlaque DescriptionComments +----------+--------+--------+--------+------------------+--------+ CCA Prox  93      29  heterogenous               +----------+--------+--------+--------+------------------+--------+ CCA Distal73      27              heterogenous               +----------+--------+--------+--------+------------------+--------+ ICA Prox  64      35              heterogenous               +----------+--------+--------+--------+------------------+--------+ ICA Distal83      40                                          +----------+--------+--------+--------+------------------+--------+ ECA       91      23                                         +----------+--------+--------+--------+------------------+--------+ +----------+--------+--------+--------+-------------------+           PSV cm/sEDV cm/sDescribeArm Pressure (mmHG) +----------+--------+--------+--------+-------------------+ [id number]                                          +----------+--------+--------+--------+-------------------+ +---------+--------+--+--------+--+---------+ VertebralPSV cm/s35EDV cm/s10Antegrade +---------+--------+--+--------+--+---------+   Summary: Right Carotid: The extracranial vessels were near-normal with only minimal wall                thickening or plaque. Left Carotid: The extracranial vessels were near-normal with only minimal wall               thickening or plaque. Vertebrals: Bilateral vertebral arteries demonstrate antegrade flow. *See table(s) above for measurements and observations.     Preliminary    MR ANGIO HEAD WO CONTRAST  Result Date: 02/25/2022 CLINICAL DATA:  Stroke, follow up EXAM: MRA HEAD WITHOUT CONTRAST TECHNIQUE: Angiographic images of the Circle of Willis were acquired using MRA technique without intravenous contrast. COMPARISON:  MRI head February 24, 2022. FINDINGS: Anterior circulation: Bilateral intracranial ICAs, MCAs, and ACAs are patent without proximal hemodynamically significant stenosis. Posterior circulation: Bilateral intradural vertebral arteries, basilar artery and bilateral posterior cerebral arteries are patent without proximal hemodynamically significant stenosis. Left fetal type PCA, anatomic variant. IMPRESSION: No emergent large vessel occlusion or proximal hemodynamically significant stenosis. Electronically Signed   By: Feliberto Harts M.D.   On: 02/25/2022 09:01   MR BRAIN WO CONTRAST  Result Date: 02/24/2022 CLINICAL DATA:  Altered mental status.  Found  down. EXAM: MRI HEAD WITHOUT CONTRAST TECHNIQUE: Multiplanar, multiecho pulse sequences of the brain and surrounding structures were obtained without intravenous contrast. 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 No evidence of hemorrhage. No extra-axial fluid collection. No hydrocephalus. 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 Vascular: Normal flow voids. Skull and upper cervical spine: Normal marrow signal. Sinuses/Orbits: Negative. Other: There is a soft tissue hematoma along the midline frontal scalp. IMPRESSION: 1. 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 No hemorrhage or mass effect. 2. Soft tissue  hematoma along the midline frontal scalp. Electronically Signed   By: Marin Roberts M.D.   On: 02/24/2022 10:10   CT EXTREMITY LOWER RIGHT WO CONTRAST  Addendum Date: 02/24/2022   ADDENDUM REPORT: 02/24/2022 03:59 ADDENDUM: Discussed over the phone in detail with Dr. Marcello Moores at 3:51 a.m., 02/24/2022, with verbal acknowledgement of the key findings. Electronically Signed   By: Telford Nab M.D.   On: 02/24/2022 03:59   Result Date: 02/24/2022 CLINICAL DATA:  Severe rhabdomyolysis. Evaluate for compartment syndrome. EXAM: CT OF THE LOWER RIGHT EXTREMITY WITHOUT CONTRAST TECHNIQUE: Multidetector CT imaging of the entire right lower extremity was performed according to the standard protocol. RADIATION DOSE REDUCTION: This exam was performed according to the departmental dose-optimization program which includes automated exposure control, adjustment of the mA and/or kV according to patient size and/or use of iterative reconstruction technique. COMPARISON:  Limited comparison is available with a right hip CT from 03/31/2019. FINDINGS: Bones/Joint/Cartilage Normal bone  mineralization. There is no evidence of fractures or aggressive bone lesions and no findings of right femoral head avascular necrosis. 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 No significant ankle or foot arthropathy is seen. No evidence of joint effusions or hemarthrosis. There is a small accessory navicular bone incidentally noted as well as a tiny os peroneum alongside the cuboid bone. Ligaments Suboptimally assessed by CT. Muscles and Tendons There is diffuse edema and swelling of the right gluteus medius muscle, the dorsal hip adductors in the proximal thigh and the distal aspect of the vastus medialis. The foreleg and plantar foot musculature are unremarkable without contrast. Area tendons are grossly intact, as visualized. Soft tissues No mass, soft tissue gas or localizing fluid collection is seen. There is scattered nonlocalizing fluid in the dorsal proximal thigh between the muscle groups. There is mild superficial edema in the thigh. IMPRESSION: 1. Diffuse edema and swelling of the right gluteus medius muscle, the dorsal hip adductors in the proximal thigh, and the distal aspect of the vastus medialis. 2. 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 3. No evidence of fractures or aggressive bone lesions. 4. Mild nonerosive degenerative changes of the right hip and lateral patellofemoral joint. Electronically Signed: By: Telford Nab M.D. On: 02/24/2022 02:26   CT Extrem Up Entire Arm R WO/CM  Addendum Date: 02/24/2022   ADDENDUM REPORT: 02/24/2022 02:27 ADDENDUM: Not mentioned above, there are coarse linear scar-like opacities in the right lung apex and right lower lung field. There is a 4 mm chronic nodule in right lower lung field which was seen on a 2008 abdomen pelvis CT and is unchanged. Electronically Signed   By:  Telford Nab M.D.   On: 02/24/2022 02:27   Result Date: 02/24/2022 CLINICAL DATA:  Severe rhabdomyolysis. Evaluate for compartment syndrome findings. EXAM: CT OF THE UPPER RIGHT EXTREMITY WITHOUT CONTRAST TECHNIQUE: Multidetector CT imaging of the upper right extremity was performed according to the standard protocol. RADIATION DOSE REDUCTION: This exam was performed according to the departmental dose-optimization program which includes automated exposure control, adjustment of the mA and/or kV according to patient size and/or use of iterative reconstruction technique. COMPARISON:  None Available. FINDINGS: Bones/Joint/Cartilage There is normal bone mineralization without acute fractures. There is a cortical depression in the posterosuperior right humeral head suggesting an age-indeterminate Hill-Sachs impaction deformity such as due to an old anterior shoulder  dislocation. 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 There is no evidence of joint effusions or hemarthrosis. No destructive bone lesion or aggressive periostitis is seen in the right upper extremity. Ligaments Suboptimally assessed by CT. Muscles and Tendons There are considerable beam hardening artifacts through the upper extremity due to the study having been performed with his arm by his side. This limits fine detail regarding the muscles and tendons. There is a grossly normal muscle bulk for the patient's age. Whether or not there are tendon abnormalities is indeterminate. Soft tissues No soft tissue gas or fluid collections are identified. IMPRESSION: 1. No soft tissue gas or focal fluid collections are identified. Absence of these findings however, should not delay surgical exploration if there is strong clinical concern for compartment syndrome with necrotizing fasciitis. 2. Cortical depression in the posterosuperior right humeral head suggesting an age-indeterminate Hill-Sachs  impaction deformity such as due to an old anterior shoulder dislocation. 3. 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 No evidence of joint effusions or hemarthrosis. 4. No destructive bone lesion or aggressive periostitis in the right upper extremity. 5. Significant beam hardening artifacts through the upper extremity due to technique. This limits fine detail regarding the muscles and tendons. Electronically Signed: By: Almira Bar M.D. On: 02/24/2022 02:03   DG Pelvis 1-2 Views  Result Date: 02/23/2022 CLINICAL DATA:  Fall. EXAM: PELVIS - 1-2 VIEW COMPARISON:  None Available. FINDINGS: There is no evidence of pelvic fracture or diastasis. No pelvic bone lesions are seen. IMPRESSION: Negative. Electronically Signed   By: Darliss Cheney M.D.   On: 02/23/2022 21:41   DG Chest Portable 1 View  Result Date: 02/23/2022 CLINICAL DATA:  Fall EXAM: PORTABLE CHEST 1 VIEW COMPARISON:  02/06/2022 FINDINGS: Heart and mediastinal contours are within normal limits. No focal opacities or effusions. No acute bony abnormality. IMPRESSION: No active disease. Electronically Signed   By: Charlett Nose M.D.   On: 02/23/2022 21:11   CT HEAD CODE STROKE WO CONTRAST  Result Date: 02/23/2022 CLINICAL DATA:  Code stroke. Initial evaluation for stroke, right-sided deficits. EXAM: CT HEAD WITHOUT CONTRAST TECHNIQUE: Contiguous axial images were obtained from the base of the skull through the vertex without intravenous contrast. RADIATION DOSE REDUCTION: This exam was performed according to the departmental dose-optimization program which includes automated exposure control, adjustment of the mA and/or kV according to patient size and/or use of iterative reconstruction technique. COMPARISON:  Prior CT from 02/22/2019. FINDINGS: Brain: Cerebral volume within normal limits for age. 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 No  acute large vessel territory infarct. No acute intracranial hemorrhage. No mass lesion or midline shift. No hydrocephalus or extra-axial fluid collection. Vascular: No asymmetric hyperdense vessel. Skull: Focal soft tissue thickening/swelling present at the central forehead, indeterminate (series 5, image 22). Calvarium intact. Sinuses/Orbits: Globes orbital soft tissues within normal limits. Paranasal sinuses and mastoid air cells are largely clear. Other: None. ASPECTS [facility] Stroke Program Early CT Score) - Ganglionic level infarction (caudate, lentiform nuclei, internal capsule, insula, M1-M3 cortex): 7 - Supraganglionic infarction (M4-M6 cortex): 3 Total score (0-10 with 10 being normal): 10 IMPRESSION: 1. No acute intracranial abnormality. 2. ASPECTS is 10. 3. 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 4. Focal soft tissue thickening and/or swelling at the central forehead. Correlation with physical  exam recommended. These results were communicated to Dr. Wilford CornerArora at 8:42 pm on 02/23/2022 by text page via the [facility] messaging system. Electronically Signed   By: Rise MuBenjamin  McClintock M.D.   On: 02/23/2022 20:45    Labs: BMET Recent Labs  Lab 02/23/22 2008 02/23/22 2016 02/23/22 2301 02/24/22 0205 02/24/22 0252 02/24/22 0338 02/24/22 1231 02/24/22 1947 02/24/22 1950 02/25/22 0332  NA 137 137 139 135 137 138 138 136 134* 133*  K 7.1* 7.1* 5.2* 5.7* 5.7* 6.1* 5.7* 4.3 4.2 3.6  CL 98 101 106 105  --  107 105 99  --  95*  CO2 22  --   --  17*  --  18* 19* 20*  --  23  GLUCOSE 145* 139* 90 111*  --  126* 95 156*  --  162*  BUN 56* 68* 54* 63*  --  62* 71* 75*  --  74*  CREATININE 3.33* 3.40* 3.40* 3.44*  --  3.53* 4.12* 4.77*  --  5.41*  CALCIUM 7.0*  --   --  6.2*  --  6.5* 6.8* 6.7*  --  6.4*  PHOS  --   --   --   --   --   --  6.6* 6.0*  --   --    CBC Recent Labs  Lab 02/23/22 2008 02/23/22 2016 02/24/22 0205 02/24/22 0252  02/24/22 0338 02/24/22 1950 02/25/22 0332  WBC 16.4*  --  15.6*  --  16.0*  --  9.8  NEUTROABS 14.3*  --   --   --   --   --   --   HGB 17.3*   < > 15.7 15.6 17.2* 13.9 13.4  HCT 53.1*   < > 47.8 46.0 50.2 41.0 38.1*  MCV 97.8  --  98.6  --  96.5  --  92.3  PLT 349  --  266  --  283  --  198   < > = values in this interval not displayed.    Medications:      stroke: early stages of recovery book   Does not apply Once   amLODipine  10 mg Oral Daily   aspirin EC  325 mg Oral Daily   cloNIDine  0.1 mg Oral TID   heparin  5,000 Units Subcutaneous Q8H   metoprolol tartrate  50 mg Oral BID   Bufford ButtnerElizabeth Myron Lona, MD 02/25/2022, 1:22 PM

## 2022-02-25 NOTE — Progress Notes (Signed)
Carotid duplex & lower extremity venous has been completed.   Preliminary results in CV Proc.   Franklin Anderson 02/25/2022 9:24 AM

## 2022-02-25 NOTE — Progress Notes (Signed)
Overnight progress note  Received secure chat message from RN (not urgently paged as per hospital protocol) requesting orthopedic evaluation of the patient's leg for compartment syndrome due to patient's right leg and hip appearing swollen and firm.   Chart reviewed.  In brief, 51 year old male with history of polysubstance abuse admitted 24 hours ago for severe rhabdomyolysis, AKI, hyperkalemia, hypocalcemia, elevated transaminases, and acute stroke.  CK peaked at >50,000 and trended down to 38,091 on most recent labs.  Creatinine trending up, 4.7 on most recent labs.  Admitting physician had discussed the case with Dr. Marcello Moores who had recommended nonsurgical management.  He was placed on IV bicarb for hydration.  CT of right lower extremity done yesterday at the time of admission: "IMPRESSION: 1. Diffuse edema and swelling of the right gluteus medius muscle, the dorsal hip adductors in the proximal thigh, and the distal aspect of the vastus medialis. 2. 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 3. No evidence of fractures or aggressive bone lesions. 4. Mild nonerosive degenerative changes of the right hip and lateral patellofemoral joint."  Patient immediately seen and examined at bedside.  Resting comfortably, watching television.  He is reporting increasing pain and swelling of his right leg.  Reporting slight numbness in his upper leg since the time of admission.  On exam, right lower extremity swollen and very firm to touch from the hip down.  Dorsalis pedis pulse strong and intact.  Blood pressure elevated with systolic in the 287O but remainder of vital signs stable. I urgently paged [facility] and spoke to Dr. Kathaleen Bury.  He has reviewed the patient's chart and recommending continuing medical management for rhabdomyolysis and requesting that dayteam contact Dr. Stann Mainland for  further recommendations.

## 2022-02-25 NOTE — Progress Notes (Signed)
No orthopedic surgery recommeded at this time.  Pt with significant crush injury to RLE  during substance abuse episode and prolonged period on floor. , and subsequent myositis and now rhabdo.  Continue medical management.

## 2022-02-25 NOTE — ED Notes (Signed)
Attempted to obtain 2nd IV unsuccessful x 2

## 2022-02-25 NOTE — ED Notes (Signed)
This RN just spoke with Dr. Marlowe Sax regarding my concern for reassessment of his right leg. Dr. Marlowe Sax states she will come down to assess him as well as call ortho.

## 2022-02-25 NOTE — Progress Notes (Signed)
OT Cancellation Note  Patient Details Name: CANDLER GINSBERG MRN: [id number] DOB: May 17, 1970   Cancelled Treatment:    Reason Eval/Treat Not Completed: Medical issues which prohibited therapy Per PT and nursing, pt in significant pain even with rolling in bed. Also noted ED notes for ortho to reassess this am for compartment syndrome.  Will follow up for OT eval as schedule permits, likely tomorrow 11/2.  Layla Maw 02/25/2022, 2:08 PM

## 2022-02-25 NOTE — Plan of Care (Signed)
  Problem: Education: Goal: Knowledge of disease or condition will improve Outcome: Progressing   Problem: Nutrition: Goal: Risk of aspiration will decrease Outcome: Progressing   Problem: Education: Goal: Knowledge of General Education information will improve Description: Including pain rating scale, medication(s)/side effects and non-pharmacologic comfort measures Outcome: Progressing   Problem: Activity: Goal: Risk for activity intolerance will decrease Outcome: Not Progressing   Problem: Pain Managment: Goal: General experience of comfort will improve Outcome: Progressing   Problem: Safety: Goal: Ability to remain free from injury will improve Outcome: Progressing   Problem: Skin Integrity: Goal: Risk for impaired skin integrity will decrease Outcome: Progressing

## 2022-02-25 NOTE — Progress Notes (Addendum)
STROKE TEAM PROGRESS NOTE   INTERVAL HISTORY No family at the bedside. Patient is c/o of right leg pain from hip to foot. He is alert and oriented. Moves bilateral uppers and left leg. Can not move right leg or wiggle toes due to pain. Endorses he wants to stop cocaine use MRA no LVO.  Recommend TEE when patient is stable enough  Vitals:   02/25/22 0445 02/25/22 0615 02/25/22 0715 02/25/22 0741  BP: (!) 174/123 (!) 188/112 (!) 197/121   Pulse: 86 91 92   Resp: 17 (!) 21 18   Temp:    98.1 F (36.7 C)  TempSrc:    Oral  SpO2: 96% 93% 97%   Weight:      Height:       CBC:  Recent Labs  Lab 02/23/22 2008 02/23/22 2016 02/24/22 0338 02/24/22 1950 02/25/22 0332  WBC 16.4*   < > 16.0*  --  9.8  NEUTROABS 14.3*  --   --   --   --   HGB 17.3*   < > 17.2* 13.9 13.4  HCT 53.1*   < > 50.2 41.0 38.1*  MCV 97.8   < > 96.5  --  92.3  PLT 349   < > 283  --  198   < > = values in this interval not displayed.   Basic Metabolic Panel:  Recent Labs  Lab 02/24/22 1231 02/24/22 1947 02/24/22 1950 02/25/22 0332  NA 138 136 134* 133*  K 5.7* 4.3 4.2 3.6  CL 105 99  --  95*  CO2 19* 20*  --  23  GLUCOSE 95 156*  --  162*  BUN 71* 75*  --  74*  CREATININE 4.12* 4.77*  --  5.41*  CALCIUM 6.8* 6.7*  --  6.4*  PHOS 6.6* 6.0*  --   --    Lipid Panel:  Recent Labs  Lab 02/24/22 0454  CHOL 102  TRIG 49  HDL 34*  CHOLHDL 3.0  VLDL 10  LDLCALC 58   HgbA1c:  Recent Labs  Lab 02/24/22 0205  HGBA1C 5.7*   Urine Drug Screen:  Recent Labs  Lab 02/23/22 2323  LABOPIA NONE DETECTED  COCAINSCRNUR POSITIVE*  LABBENZ POSITIVE*  AMPHETMU POSITIVE*  THCU NONE DETECTED  LABBARB NONE DETECTED    Alcohol Level  Recent Labs  Lab 02/23/22 2008  ETH <10    IMAGING past 24 hours VAS US CAROTID  Result Date: 02/25/2022 Carotid Arterial Duplex Study Patient Name:  RYLON POITRA  Date of Exam:   02/25/2022 Medical Rec #: [id number]       Accession #:    [id number] Date of Birth:  Jun 09, 1970      Patient Gender: M Patient Age:   51 years Exam Location:  [facility] Procedure:      VAS US CAROTID Referring Phys: Scheryl Marten Buffie Herne --------------------------------------------------------------------------------  Indications:       CVA. Risk Factors:      Hypertension. Comparison Study:  no prior Performing Technologist: [place] Ponder RVS  Examination Guidelines: A complete evaluation includes B-mode imaging, spectral Doppler, color Doppler, and power Doppler as needed of all accessible portions of each vessel. Bilateral testing is considered an integral part of a complete examination. Limited examinations for reoccurring indications may be performed as noted.  Right Carotid Findings: +----------+--------+--------+--------+------------------+--------+           PSV cm/sEDV cm/sStenosisPlaque DescriptionComments +----------+--------+--------+--------+------------------+--------+ CCA Prox  90      29  heterogenous               +----------+--------+--------+--------+------------------+--------+ CCA Distal57      24              heterogenous               +----------+--------+--------+--------+------------------+--------+ ICA Prox  53      21              heterogenous               +----------+--------+--------+--------+------------------+--------+ ICA Distal71      30                                         +----------+--------+--------+--------+------------------+--------+ ECA       81      27                                         +----------+--------+--------+--------+------------------+--------+ +----------+--------+-------+--------+-------------------+           PSV cm/sEDV cmsDescribeArm Pressure (mmHG) +----------+--------+-------+--------+-------------------+ Subclavian89                                         +----------+--------+-------+--------+-------------------+ +---------+--------+--+--------+--+---------+  VertebralPSV cm/s41EDV cm/s18Antegrade +---------+--------+--+--------+--+---------+  Left Carotid Findings: +----------+--------+--------+--------+------------------+--------+           PSV cm/sEDV cm/sStenosisPlaque DescriptionComments +----------+--------+--------+--------+------------------+--------+ CCA Prox  93      29              heterogenous               +----------+--------+--------+--------+------------------+--------+ CCA Distal73      27              heterogenous               +----------+--------+--------+--------+------------------+--------+ ICA Prox  64      35              heterogenous               +----------+--------+--------+--------+------------------+--------+ ICA Distal83      40                                         +----------+--------+--------+--------+------------------+--------+ ECA       91      23                                         +----------+--------+--------+--------+------------------+--------+ +----------+--------+--------+--------+-------------------+           PSV cm/sEDV cm/sDescribeArm Pressure (mmHG) +----------+--------+--------+--------+-------------------+ [id number]                                          +----------+--------+--------+--------+-------------------+ +---------+--------+--+--------+--+---------+ VertebralPSV cm/s35EDV cm/s10Antegrade +---------+--------+--+--------+--+---------+   Summary: Right Carotid: The extracranial vessels were near-normal with only minimal wall                thickening or plaque. Left Carotid: The  extracranial vessels were near-normal with only minimal wall               thickening or plaque. Vertebrals: Bilateral vertebral arteries demonstrate antegrade flow. *See table(s) above for measurements and observations.     Preliminary    VAS US LOWER EXTREMITY VENOUS (DVT)  Result Date: 02/25/2022  Lower Venous DVT Study Patient Name:  Si GaulUL R Appelhans  Date  of Exam:   02/25/2022 Medical Rec #: [id number]       Accession #:    [id number] Date of Birth: 10-01-1970      Patient Gender: M Patient Age:   [age] years [facility] Procedure:      VAS US LOWER EXTREMITY VENOUS (DVT) Referring Phys: Scheryl MartenJINDONG Magic Mohler --------------------------------------------------------------------------------  Indications: Stroke.  Comparison Study: prior 03/01/19 Performing Technologist: [place] PonderMegan Stricklin RVS  Examination Guidelines: A complete evaluation includes B-mode imaging, spectral Doppler, color Doppler, and power Doppler as needed of all accessible portions of each vessel. Bilateral testing is considered an integral part of a complete examination. Limited examinations for reoccurring indications may be performed as noted. The reflux portion of the exam is performed with the patient in reverse Trendelenburg.  +--------+---------------+---------+-----------+----------+--------------------+ RIGHT   CompressibilityPhasicitySpontaneityPropertiesThrombus Aging       +--------+---------------+---------+-----------+----------+--------------------+ CFV     Full           Yes      Yes                                       +--------+---------------+---------+-----------+----------+--------------------+ SFJ     Full                                                              +--------+---------------+---------+-----------+----------+--------------------+ FV Prox Full                                                              +--------+---------------+---------+-----------+----------+--------------------+ FV Mid  Full           Yes      Yes                                       +--------+---------------+---------+-----------+----------+--------------------+ FV      Full                                                              Distal                                                                     +--------+---------------+---------+-----------+----------+--------------------+  PFV     Full                                                              +--------+---------------+---------+-----------+----------+--------------------+ POP     Full           Yes      Yes                                       +--------+---------------+---------+-----------+----------+--------------------+ PTV     Full           Yes      Yes                  patent by color                                                           doppler              +--------+---------------+---------+-----------+----------+--------------------+ PERO    Full           Yes      Yes                  patent by color                                                           doppler              +--------+---------------+---------+-----------+----------+--------------------+   +---------+---------------+---------+-----------+----------+--------------+ LEFT     CompressibilityPhasicitySpontaneityPropertiesThrombus Aging +---------+---------------+---------+-----------+----------+--------------+ CFV      Full           Yes      Yes                                 +---------+---------------+---------+-----------+----------+--------------+ SFJ      Full                                                        +---------+---------------+---------+-----------+----------+--------------+ FV Prox  Full                                                        +---------+---------------+---------+-----------+----------+--------------+ FV Mid   Full                                                        +---------+---------------+---------+-----------+----------+--------------+  FV DistalFull                                                        +---------+---------------+---------+-----------+----------+--------------+ PFV      Full                                                         +---------+---------------+---------+-----------+----------+--------------+ POP      Full           Yes      Yes                                 +---------+---------------+---------+-----------+----------+--------------+ PTV      Full                                                        +---------+---------------+---------+-----------+----------+--------------+ PERO     Full                                                        +---------+---------------+---------+-----------+----------+--------------+     Summary: BILATERAL: - No evidence of deep vein thrombosis seen in the lower extremities, bilaterally. -No evidence of popliteal cyst, bilaterally.   *See table(s) above for measurements and observations.    Preliminary    MR ANGIO HEAD WO CONTRAST  Result Date: 02/25/2022 CLINICAL DATA:  Stroke, follow up EXAM: MRA HEAD WITHOUT CONTRAST TECHNIQUE: Angiographic images of the Circle of Willis were acquired using MRA technique without intravenous contrast. COMPARISON:  MRI head February 24, 2022. FINDINGS: Anterior circulation: Bilateral intracranial ICAs, MCAs, and ACAs are patent without proximal hemodynamically significant stenosis. Posterior circulation: Bilateral intradural vertebral arteries, basilar artery and bilateral posterior cerebral arteries are patent without proximal hemodynamically significant stenosis. Left fetal type PCA, anatomic variant. IMPRESSION: No emergent large vessel occlusion or proximal hemodynamically significant stenosis. Electronically Signed   By: Margaretha Sheffield M.D.   On: 02/25/2022 09:01    PHYSICAL EXAM  Temp:  [98.1 F (36.7 C)-98.9 F (37.2 C)] 98.2 F (36.8 C) (11/01 1145) Pulse Rate:  [86-125] 89 (11/01 1145) Resp:  [11-23] 18 (11/01 1145) BP: (167-197)/(108-136) 192/108 (11/01 1145) SpO2:  [91 %-100 %] 100 % (11/01 1145)  General - Well nourished, well developed, in no apparent distress. Cardiovascular - Regular rhythm and  rate.  Mental Status -  Level of arousal and orientation to time, place, and person were intact. Language including expression, naming, repetition, comprehension was assessed and found intact. Attention span and concentration were normal. Recent and remote memory were intact. Fund of Knowledge was assessed and was intact.  Cranial Nerves II - XII - II - Visual field intact OU. III, IV, VI - Extraocular movements intact. V - Facial sensation intact bilaterally. VII - Facial movement intact  bilaterally. VIII - Hearing & vestibular intact bilaterally. X - Palate elevates symmetrically. XI - Chin turning & shoulder shrug intact bilaterally. XII - Tongue protrusion intact.  Motor Strength - The patient's strength was normal in bilateral uppers and left lower and pronator drift was absent.  Right lower unable to wiggle or lift leg due to swelling and painBulk was normal and fasciculations were absent.   Motor Tone - Muscle tone was assessed at the neck and appendages and was normal.  Sensory - Light touch, temperature/pinprick were assessed and were symmetrical.    Coordination - The patient had normal movements in the hands and feet with no ataxia or dysmetria.  Tremor was absent.  Gait and Station - deferred.   ASSESSMENT/PLAN Mr. JEFFORY SNELGROVE is a 51 y.o. male with history of polysubstance abuse, essential hypertension , rheumatic fever with subsequent heart murmur who presents to ED BIB EMS after being found down at home in his own feces,after falling out of bed last pm. 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 Severe rhabdomyolysis   Code Stroke CT head No acute abnormality. 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 MRI acute infarcts in the left cerebellum and in the corona  radiata on the right.   MRA head No emergent large vessel occlusion or proximal hemodynamically significant stenosis. Carotid Doppler unremarkable 2D Echo EF 55% Recommend TEE when stable to rule out endocarditis [place] LE bilateral negative for DVT LDL 58 HgbA1c 5.7 UDS positive for cocaine, amphetamine, benzo VTE prophylaxis - Heparin No antithrombotic prior to admission, now on aspirin 325 mg daily. No DAPT in case needing surgical intervnention Therapy recommendations:  pending Disposition:  pending  Severe rhabdomyolysis AKI CK significantly elevated, trending down AST and ALT also elevated and trending down Cre 3.33->4.12->5.41 On bicarb 200cc/h IV BMP monitoring  Right leg swelling Could be related to severe rhabdo Ortho on board On zyvox and zosyn ? Compartment syndrome  Hypertension Home meds:  losartan metoprolol Significantly elevated Now on norvasc, clonidine, metorolol Long-term BP goal normotensive  Hyperlipidemia Home meds:  none LDL 58, goal < 70 High intensity statin not indicated below goal   Tobacco abuse Current smoker Smoking cessation counseling provided Nicotine patch provided Pt is willing to quit  Cocaine abuse Cessation education provided.  Other Stroke Risk Factors Hx stroke/TIA by imaging  Other Active Problems   Hospital day # 1  Marvel Plan, MD PhD Stroke Neurology 02/25/2022 11:27 PM   To contact Stroke Continuity provider, please refer to [email or web address]. After hours, contact General Neurology

## 2022-02-25 NOTE — ED Notes (Signed)
Pt placed into a hospital bed.

## 2022-02-25 NOTE — Progress Notes (Signed)
ED provider able to place PIV. No additional access needed at this time.

## 2022-02-25 NOTE — Progress Notes (Signed)
Pharmacy Antibiotic Note  Franklin Anderson is a 51 y.o. male admitted on 02/23/2022 with AMS, and R sided weakness after a fall overnight. Septic on admit with suspected septic R hip. CT scan revealed severe muscle inflammation concerning for possible necrotizing fasciitis. Pharmacy has been consulted for linezolid and zosyn dosing. Pt currently has  worsening AKI with Scr up to 5.41 (BL ~0.8).   Plan: Change zosyn to 2.25g IV Q8H F/u renal fxn, C&S, clinical status  Height: 6' (182.9 cm) Weight: 56.7 kg (125 lb) IBW/kg (Calculated) : 77.6  Temp (24hrs), Avg:98.5 F (36.9 C), Min:98.1 F (36.7 C), Max:98.9 F (37.2 C)  Recent Labs  Lab 02/23/22 2008 02/23/22 2016 2022/03/18 0205 03-18-22 0338 03/18/2022 1231 18-Mar-2022 1947 02/25/22 0332  WBC 16.4*  --  15.6* 16.0*  --   --  9.8  CREATININE 3.33*   < > 3.44* 3.53* 4.12* 4.77* 5.41*   < > = values in this interval not displayed.     Estimated Creatinine Clearance: 13 mL/min (A) (by C-G formula based on SCr of 5.41 mg/dL (H)).    Allergies  Allergen Reactions   Sulfa Antibiotics Anaphylaxis   Penicillins Other (See Comments)    "Near death" reaction per pt 2022/03/18, could not elaborate.    Antimicrobials this admission: Vanc x 1 03/19/2023  Zosyn 2023-03-19 >> Linezolid 03-19-23 >>   Dose adjustments this admission: N/a  Microbiology results: 2023-03-19 BCx: NGTD 10/30 UCx: collected  Thank you for allowing pharmacy to be a part of this patient's care.  Salome Arnt, PharmD, BCPS, BCEMP Clinical Pharmacist Please see AMION for all pharmacy numbers 02/25/2022 7:30 AM

## 2022-02-25 NOTE — TOC Initial Note (Signed)
Transition of Care [facility]) - Initial/Assessment Note    Patient Details  Name: Franklin Anderson MRN: [id number] Date of Birth: 02-Feb-1971  Transition of Care [facility]) CM/SW Contact:    Pollie Friar, RN Phone Number: 02/25/2022, 2:43 PM  Clinical Narrative:                 Pt is from home with his spouse. He states she is with hm most of the time. No DME at home.  Pt manages his own medications and drives self as needed. Wife drives also.  Awaiting PT/OT evals.  Pt is without insurance but uses [facility] and their pharmacy.  TOC following.  Expected Discharge Plan: Home/Self Care Barriers to Discharge: Continued Medical Work up   Patient Goals and CMS Choice        Expected Discharge Plan and Services Expected Discharge Plan: Home/Self Care       Living arrangements for the past 2 months: Single Family Home                                      Prior Living Arrangements/Services Living arrangements for the past 2 months: Single Family Home Lives with:: Spouse Patient language and need for interpreter reviewed:: Yes Do you feel safe going back to the place where you live?: Yes        Care giver support system in place?: Yes (comment)   Criminal Activity/Legal Involvement Pertinent to Current Situation/Hospitalization: No - Comment as needed  Activities of Daily Living Home Assistive Devices/Equipment: None ADL Screening (condition at time of admission) Patient's cognitive ability adequate to safely complete daily activities?: Yes Is the patient deaf or have difficulty hearing?: No Does the patient have difficulty seeing, even when wearing glasses/contacts?: No Does the patient have difficulty concentrating, remembering, or making decisions?: No Patient able to express need for assistance with ADLs?: Yes Does the patient have difficulty dressing or bathing?: No Independently performs ADLs?: Yes (appropriate for developmental age) Does the patient have difficulty  walking or climbing stairs?: Yes ([place]) Weakness of Legs: Right Weakness of Arms/Hands: Both  Permission Sought/Granted                  Emotional Assessment Appearance:: Appears stated age Attitude/Demeanor/Rapport: Engaged Affect (typically observed): Accepting Orientation: : Oriented to Self, Oriented to Place, Oriented to  Time, Oriented to Situation   Psych Involvement: No (comment)  Admission diagnosis:  Change in mental status [R41.82] Non-traumatic rhabdomyolysis [M62.82] Patient Active Problem List   Diagnosis Date Noted   Change in mental status 02/24/2022   Non-traumatic rhabdomyolysis    Hypertensive urgency 12/25/2021   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   GAD (generalized anxiety disorder) 12/25/2021   PTSD (post-traumatic stress disorder) 12/25/2021   Epidural abscess    Chest tube in place    Pain    Pneumothorax on left    Fistula    Empyema, left (HCC)    Acute pulmonary embolism (HCC)    Pleural effusion on left    MSSA bacteremia    Abnormal echocardiogram    Protein-calorie malnutrition, severe 02/23/2019   Sepsis ([place]) 02/22/2019   Endocarditis 02/22/2019   Septic pulmonary embolism ([place]) 02/22/2019   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   Acute encephalopathy 02/22/2019   AKI (acute kidney injury) ([place]) 02/22/2019  Transaminitis 02/22/2019   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 [place] [address] Phone: [phone] Fax: [phone]     Social Determinants of Health (SDOH) Interventions    Readmission Risk Interventions     No data to display

## 2022-02-25 NOTE — Progress Notes (Addendum)
PROGRESS NOTE    Franklin Anderson  JXB:[id number] DOB: 1970/05/17 DOA: 02/23/2022 PCP: Cain Saupe, MD   Chief Complaint  Patient presents with   Code Stroke    Brief Narrative:    Franklin Anderson is a 51 y.o. male with medical history significant of polysubstance abuse, essential hypertension , rheumatic fever with subsequent heart murmur who presents to ED BIB EMS after being found down at home in his own feces,after falling out of bed last pm.  Arrival to ED patient was noted to have significant right-sided swelling, pain, CT of right hip was significant for severe muscle inflammation of the hip/gluteal muscle edema, as well he was noted to have acute renal failure, and work-up significant for CVA.  Assessment & Plan:   Principal Problem:   Change in mental status Active Problems:   Uncontrolled hypertension   Polysubstance abuse (HCC)  AKI Severe rhabdomyolysis -Renal input greatly appreciated, total CK improving on IV fluids -Unfortunately creatinine continues to trend up -Continue with Foley catheter -We will hold Lokelma given potassium 3.6 today -Further management per renal   Acute Metabolic  Encephalopathy  -due to polysubstance abuse and acute CVA . -Mental status significantly improved .  Hyperkalemia -Improved, hold Lokelma  Hypertension -Blood pressure significantly elevated, (patient is out of permissive HTN period per neuro note))will start on clonidine which should help with withdrawals as well, amlodipine, will add as needed hydralazine    Acute CVA -He denies any IV drug abuse, -Neurology on board  Polysubstance abuse - CIWA protocol -Patient denies any IV drug abuse, reports he just snorted, he is adamant that no IV drug abuse since 2020.    Elevated LFTS: -Most likely due to above, trending down. -We will check hepatitis panel given history of presubstance abuse   Severe muscle inflammation of right hip and gluteal muscle  -Is most likely  due to severe rhabdomyolysis-most likely in the setting of cocaine use, as well possibly related to laying on the floor for unknown period of time -total CK trending down, continue to monitor-continues to have significant pain and swelling, has good distal pulses, discussed with Dr. Aundria Rud who will see today. -Continue empirically on broad antibiotic coverage  AKI -with initial hyperkalemia /in setting of rhabdomyolysis  -poor intake , ace use  -Total CK>50000 -ivfs /bicarb drip  -vbg notesph 7.244 -recently started on bicarb drip   -repeat vbg in few hours -renal consult for further assistance  -monitor strict I/o  -hold nephrotoxic medications      DVT prophylaxis: [place] heaprin Code Status: Full Family Communication: none at bedside Disposition:   Status is: Inpatient    Consultants:  Neurology Nephrology Orthopedic   Subjective:  Patient continues to have pain at right lower extremity  Objective: Vitals:   02/25/22 0715 02/25/22 0741 02/25/22 1030 02/25/22 1145  BP: (!) 197/121  (!) 179/112 (!) 192/108  Pulse: 92  94 89  Resp: Temp:  98.1 F (36.7 C) 98.6 F (37 C) 98.2 F (36.8 C)  TempSrc:  Oral Oral Oral  SpO2: 97%  95% 100%  Weight:      Height:        Intake/Output Summary (Last 24 hours) at 02/25/2022 1305 Last data filed at 02/25/2022 0738 Gross per 24 hour  Intake 1351.2 ml  Output 600 ml  Net 751.2 ml   Filed Weights   02/23/22 2046  Weight: 56.7 kg    Examination:  Awake Alert, Oriented X  3, frail, appears older than stated age Symmetrical Chest wall movement, Good air movement bilaterally, CTAB RRR,No Gallops,Rubs or new Murmurs, No Parasternal Heave +ve B.Sounds, Abd Soft, No tenderness, No rebound - guarding or rigidity. No Cyanosis, lower extremity with swelling, tenderness to palpation, had good pulses distally.    Data Reviewed: I have personally reviewed following labs and imaging studies  CBC: Recent Labs  Lab  02/23/22 2008 02/23/22 2016 02/24/22 0205 02/24/22 0252 02/24/22 0338 02/24/22 1950 02/25/22 0332  WBC 16.4*  --  15.6*  --  16.0*  --  9.8  NEUTROABS 14.3*  --   --   --   --   --   --   HGB 17.3*   < > 15.7 15.6 17.2* 13.9 13.4  HCT 53.1*   < > 47.8 46.0 50.2 41.0 38.1*  MCV 97.8  --  98.6  --  96.5  --  92.3  PLT 349  --  266  --  283  --  198   < > = values in this interval not displayed.    Basic Metabolic Panel: Recent Labs  Lab 02/24/22 0205 02/24/22 0252 02/24/22 0338 02/24/22 1231 02/24/22 1947 02/24/22 1950 02/25/22 0332  NA 135   < > 138 138 136 134* 133*  K 5.7*   < > 6.1* 5.7* 4.3 4.2 3.6  CL 105  --  107 105 99  --  95*  CO2 17*  --  18* 19* 20*  --  23  GLUCOSE 111*  --  126* 95 156*  --  162*  BUN 63*  --  62* 71* 75*  --  74*  CREATININE 3.44*  --  3.53* 4.12* 4.77*  --  5.41*  CALCIUM 6.2*  --  6.5* 6.8* 6.7*  --  6.4*  PHOS  --   --   --  6.6* 6.0*  --   --    < > = values in this interval not displayed.    GFR: Estimated Creatinine Clearance: 13 mL/min (A) (by C-G formula based on SCr of 5.41 mg/dL (H)).  Liver Function Tests: Recent Labs  Lab 02/23/22 2008 02/24/22 0205 02/24/22 0338 02/24/22 1231 02/24/22 1947 02/25/22 0332  AST 1,214* 1,134* 1,173*  --   --  645*  ALT 700* 596* 627*  --   --  440*  ALKPHOS 101 78 89  --   --  62  BILITOT 0.4 0.6 0.7  --   --  0.6  PROT 7.6 5.7* 6.2*  --   --  4.2*  ALBUMIN 4.2 3.0* 3.1* 2.7* 2.3* 2.1*    CBG: Recent Labs  Lab 02/23/22 2008 02/24/22 0747  GLUCAP 142* 131*     Recent Results (from the past 240 hour(s))  Urine Culture     Status: None   Collection Time: 02/23/22 11:23 PM   Specimen: Urine, Clean Catch  Result Value Ref Range Status   Specimen Description URINE, CLEAN CATCH  Final   Special Requests NONE  Final   Culture   Final    NO GROWTH Performed at [facility], [place] [address], [place], [place] [address]    Report Status 02/25/2022 FINAL  Final  Culture,  blood (Routine X 2) w Reflex to ID Panel     Status: None (Preliminary result)   Collection Time: 02/24/22  3:38 AM   Specimen: BLOOD LEFT HAND  Result Value Ref Range Status   Specimen Description BLOOD LEFT HAND  Final   Special Requests   Final    BOTTLES DRAWN AEROBIC ONLY Blood Culture results may not be optimal due to an inadequate volume of blood received in culture bottles   Culture   Final    NO GROWTH 1 DAY Performed at [facility], [address] [place], [place] [address]    Report Status PENDING  Incomplete  Culture, blood (Routine X 2) w Reflex to ID Panel     Status: None (Preliminary result)   Collection Time: 02/24/22  3:41 AM   Specimen: BLOOD  Result Value Ref Range Status   Specimen Description BLOOD LEFT ANTECUBITAL  Final   Special Requests   Final    BOTTLES DRAWN AEROBIC AND ANAEROBIC Blood Culture adequate volume   Culture   Final    NO GROWTH 1 DAY Performed at [facility], [address], [place], [place] [address]    Report Status PENDING  Incomplete         Radiology Studies: VAS [place] LOWER EXTREMITY VENOUS (DVT)  Result Date: 02/25/2022  Lower Venous DVT Study Patient Name:  Franklin Anderson  Date of Exam:   02/25/2022 Medical Rec #: [id number]       Accession #:    [id number] Date of Birth: [birth year]      Patient Gender: M Patient Age:   36 years Exam Location:  [facility] Procedure:      VAS [place] LOWER EXTREMITY VENOUS (DVT) Referring Phys: Scheryl Marten XU --------------------------------------------------------------------------------  Indications: Stroke.  Comparison Study: prior 03/01/19 Performing Technologist: [place] Ponder RVS  Examination Guidelines: A complete evaluation includes B-mode imaging, spectral Doppler, color Doppler, and power Doppler as needed of all accessible portions of each vessel. Bilateral testing is considered an integral part of a complete examination. Limited examinations for reoccurring indications may be  performed as noted. The reflux portion of the exam is performed with the patient in reverse Trendelenburg.  +--------+---------------+---------+-----------+----------+--------------------+ RIGHT   CompressibilityPhasicitySpontaneityPropertiesThrombus Aging       +--------+---------------+---------+-----------+----------+--------------------+ CFV     Full           Yes      Yes                                       +--------+---------------+---------+-----------+----------+--------------------+ SFJ     Full                                                              +--------+---------------+---------+-----------+----------+--------------------+ FV Prox Full                                                              +--------+---------------+---------+-----------+----------+--------------------+ FV Mid  Full           Yes      Yes                                       +--------+---------------+---------+-----------+----------+--------------------+  FV      Full                                                              Distal                                                                    +--------+---------------+---------+-----------+----------+--------------------+ PFV     Full                                                              +--------+---------------+---------+-----------+----------+--------------------+ POP     Full           Yes      Yes                                       +--------+---------------+---------+-----------+----------+--------------------+ PTV     Full           Yes      Yes                  patent by color                                                           doppler              +--------+---------------+---------+-----------+----------+--------------------+ PERO    Full           Yes      Yes                  patent by color                                                            doppler              +--------+---------------+---------+-----------+----------+--------------------+   +---------+---------------+---------+-----------+----------+--------------+ LEFT     CompressibilityPhasicitySpontaneityPropertiesThrombus Aging +---------+---------------+---------+-----------+----------+--------------+ CFV      Full           Yes      Yes                                 +---------+---------------+---------+-----------+----------+--------------+ SFJ      Full                                                        +---------+---------------+---------+-----------+----------+--------------+  FV Prox  Full                                                        +---------+---------------+---------+-----------+----------+--------------+ FV Mid   Full                                                        +---------+---------------+---------+-----------+----------+--------------+ FV DistalFull                                                        +---------+---------------+---------+-----------+----------+--------------+ PFV      Full                                                        +---------+---------------+---------+-----------+----------+--------------+ POP      Full           Yes      Yes                                 +---------+---------------+---------+-----------+----------+--------------+ PTV      Full                                                        +---------+---------------+---------+-----------+----------+--------------+ PERO     Full                                                        +---------+---------------+---------+-----------+----------+--------------+     Summary: BILATERAL: - No evidence of deep vein thrombosis seen in the lower extremities, bilaterally. -No evidence of popliteal cyst, bilaterally.   *See table(s) above for measurements and observations. Electronically signed by Sherald Hess MD on 02/25/2022 at 12:07:47 PM.    Final    VAS US CAROTID  Result Date: 02/25/2022 Carotid Arterial Duplex Study Patient Name:  Franklin Anderson  Date of Exam:   02/25/2022 Medical Rec #: [id number]       Accession #:    [id number] Date of Birth: 1970/06/25      Patient Gender: M Patient Age:   68 years Exam Location:  [facility] Procedure:      VAS US CAROTID Referring Phys: Marvel Plan --------------------------------------------------------------------------------  Indications:       CVA. Risk Factors:      Hypertension. Comparison Study:  no prior Performing Technologist: [place] Ponder RVS  Examination Guidelines: A complete evaluation includes B-mode imaging, spectral Doppler, color Doppler, and power Doppler as needed  of all accessible portions of each vessel. Bilateral testing is considered an integral part of a complete examination. Limited examinations for reoccurring indications may be performed as noted.  Right Carotid Findings: +----------+--------+--------+--------+------------------+--------+           PSV cm/sEDV cm/sStenosisPlaque DescriptionComments +----------+--------+--------+--------+------------------+--------+ CCA Prox  90      29              heterogenous               +----------+--------+--------+--------+------------------+--------+ CCA Distal57      24              heterogenous               +----------+--------+--------+--------+------------------+--------+ ICA Prox  53      21              heterogenous               +----------+--------+--------+--------+------------------+--------+ ICA Distal71      30                                         +----------+--------+--------+--------+------------------+--------+ ECA       81      27                                         +----------+--------+--------+--------+------------------+--------+ +----------+--------+-------+--------+-------------------+           PSV cm/sEDV  cmsDescribeArm Pressure (mmHG) +----------+--------+-------+--------+-------------------+ Subclavian89                                         +----------+--------+-------+--------+-------------------+ +---------+--------+--+--------+--+---------+ VertebralPSV cm/s41EDV cm/s18Antegrade +---------+--------+--+--------+--+---------+  Left Carotid Findings: +----------+--------+--------+--------+------------------+--------+           PSV cm/sEDV cm/sStenosisPlaque DescriptionComments +----------+--------+--------+--------+------------------+--------+ CCA Prox  93      29              heterogenous               +----------+--------+--------+--------+------------------+--------+ CCA Distal73      27              heterogenous               +----------+--------+--------+--------+------------------+--------+ ICA Prox  64      35              heterogenous               +----------+--------+--------+--------+------------------+--------+ ICA Distal83      40                                         +----------+--------+--------+--------+------------------+--------+ ECA       91      23                                         +----------+--------+--------+--------+------------------+--------+ +----------+--------+--------+--------+-------------------+           PSV cm/sEDV cm/sDescribeArm Pressure (mmHG) +----------+--------+--------+--------+-------------------+ [id number]                                          +----------+--------+--------+--------+-------------------+ +---------+--------+--+--------+--+---------+  VertebralPSV cm/s35EDV cm/s10Antegrade +---------+--------+--+--------+--+---------+   Summary: Right Carotid: The extracranial vessels were near-normal with only minimal wall                thickening or plaque. Left Carotid: The extracranial vessels were near-normal with only minimal wall               thickening or plaque. Vertebrals:  Bilateral vertebral arteries demonstrate antegrade flow. *See table(s) above for measurements and observations.     Preliminary    MR ANGIO HEAD WO CONTRAST  Result Date: 02/25/2022 CLINICAL DATA:  Stroke, follow up EXAM: MRA HEAD WITHOUT CONTRAST TECHNIQUE: Angiographic images of the Circle of Willis were acquired using MRA technique without intravenous contrast. COMPARISON:  MRI head February 24, 2022. FINDINGS: Anterior circulation: Bilateral intracranial ICAs, MCAs, and ACAs are patent without proximal hemodynamically significant stenosis. Posterior circulation: Bilateral intradural vertebral arteries, basilar artery and bilateral posterior cerebral arteries are patent without proximal hemodynamically significant stenosis. Left fetal type PCA, anatomic variant. IMPRESSION: No emergent large vessel occlusion or proximal hemodynamically significant stenosis. Electronically Signed   By: Feliberto Harts M.D.   On: 02/25/2022 09:01   MR BRAIN WO CONTRAST  Result Date: 02/24/2022 CLINICAL DATA:  Altered mental status.  Found down. EXAM: MRI HEAD WITHOUT CONTRAST TECHNIQUE: Multiplanar, multiecho pulse sequences of the brain and surrounding structures were obtained without intravenous contrast. 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 No evidence of hemorrhage. No extra-axial fluid collection. No hydrocephalus. 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 Vascular: Normal flow voids. Skull and upper cervical spine: Normal marrow signal. Sinuses/Orbits: Negative. Other: There is a soft tissue hematoma along the midline frontal scalp. IMPRESSION: 1. 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 No hemorrhage or mass effect. 2. Soft tissue hematoma along the midline frontal scalp.  Electronically Signed   By: Lorenza Cambridge M.D.   On: 02/24/2022 10:10   CT EXTREMITY LOWER RIGHT WO CONTRAST  Addendum Date: 02/24/2022   ADDENDUM REPORT: 02/24/2022 03:59 ADDENDUM: Discussed over the phone in detail with Dr. Maisie Fus at 3:51 a.m., 02/24/2022, with verbal acknowledgement of the key findings. Electronically Signed   By: Almira Bar M.D.   On: 02/24/2022 03:59   Result Date: 02/24/2022 CLINICAL DATA:  Severe rhabdomyolysis. Evaluate for compartment syndrome. EXAM: CT OF THE LOWER RIGHT EXTREMITY WITHOUT CONTRAST TECHNIQUE: Multidetector CT imaging of the entire right lower extremity was performed according to the standard protocol. RADIATION DOSE REDUCTION: This exam was performed according to the departmental dose-optimization program which includes automated exposure control, adjustment of the mA and/or kV according to patient size and/or use of iterative reconstruction technique. COMPARISON:  Limited comparison is available with a right hip CT from 03/31/2019. FINDINGS: Bones/Joint/Cartilage Normal bone mineralization. There is no evidence of fractures or aggressive bone lesions and no findings of right femoral head avascular necrosis. 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 No significant ankle or foot arthropathy is seen. No evidence of joint effusions or hemarthrosis. There is a small accessory navicular bone incidentally noted as well as a tiny os peroneum alongside the cuboid bone. Ligaments Suboptimally assessed by CT. Muscles and Tendons There is diffuse edema and swelling of the right gluteus medius muscle, the  dorsal hip adductors in the proximal thigh and the distal aspect of the vastus medialis. The foreleg and plantar foot musculature are unremarkable without contrast. Area tendons are grossly intact, as visualized. Soft tissues No mass, soft tissue gas or localizing fluid collection is seen. There is  scattered nonlocalizing fluid in the dorsal proximal thigh between the muscle groups. There is mild superficial edema in the thigh. IMPRESSION: 1. Diffuse edema and swelling of the right gluteus medius muscle, the dorsal hip adductors in the proximal thigh, and the distal aspect of the vastus medialis. 2. 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 3. No evidence of fractures or aggressive bone lesions. 4. Mild nonerosive degenerative changes of the right hip and lateral patellofemoral joint. Electronically Signed: By: Almira Bar M.D. On: 02/24/2022 02:26   CT Extrem Up Entire Arm R WO/CM  Addendum Date: 02/24/2022   ADDENDUM REPORT: 02/24/2022 02:27 ADDENDUM: Not mentioned above, there are coarse linear scar-like opacities in the right lung apex and right lower lung field. There is a 4 mm chronic nodule in right lower lung field which was seen on a 2008 abdomen pelvis CT and is unchanged. Electronically Signed   By: Almira Bar M.D.   On: 02/24/2022 02:27   Result Date: 02/24/2022 CLINICAL DATA:  Severe rhabdomyolysis. Evaluate for compartment syndrome findings. EXAM: CT OF THE UPPER RIGHT EXTREMITY WITHOUT CONTRAST TECHNIQUE: Multidetector CT imaging of the upper right extremity was performed according to the standard protocol. RADIATION DOSE REDUCTION: This exam was performed according to the departmental dose-optimization program which includes automated exposure control, adjustment of the mA and/or kV according to patient size and/or use of iterative reconstruction technique. COMPARISON:  None Available. FINDINGS: Bones/Joint/Cartilage There is normal bone mineralization without acute fractures. There is a cortical depression in the posterosuperior right humeral head suggesting an age-indeterminate Hill-Sachs impaction deformity such as due to an old anterior shoulder dislocation. There are  early findings of degenerative arthrosis of the right glenohumeral joint, elbow joint and wrist. There is no evidence of joint effusions or hemarthrosis. No destructive bone lesion or aggressive periostitis is seen in the right upper extremity. Ligaments Suboptimally assessed by CT. Muscles and Tendons There are considerable beam hardening artifacts through the upper extremity due to the study having been performed with his arm by his side. This limits fine detail regarding the muscles and tendons. There is a grossly normal muscle bulk for the patient's age. Whether or not there are tendon abnormalities is indeterminate. Soft tissues No soft tissue gas or fluid collections are identified. IMPRESSION: 1. No soft tissue gas or focal fluid collections are identified. Absence of these findings however, should not delay surgical exploration if there is strong clinical concern for compartment syndrome with necrotizing fasciitis. 2. Cortical depression in the posterosuperior right humeral head suggesting an age-indeterminate Hill-Sachs impaction deformity such as due to an old anterior shoulder dislocation. 3. 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 No evidence of joint effusions or hemarthrosis. 4. No destructive bone lesion or aggressive periostitis in the right upper extremity. 5. Significant beam hardening artifacts through the upper extremity due to technique. This limits fine detail regarding the muscles and tendons. Electronically Signed: By: Almira Bar M.D. On: 02/24/2022 02:03   DG Pelvis 1-2 Views  Result Date: 02/23/2022 CLINICAL DATA:  Fall. EXAM: PELVIS - 1-2 VIEW COMPARISON:  None Available. FINDINGS:  There is no evidence of pelvic fracture or diastasis. No pelvic bone lesions are seen. IMPRESSION: Negative. Electronically Signed   By: Darliss Cheney M.D.   On: 02/23/2022 21:41   DG Chest Portable 1 View  Result Date: 02/23/2022 CLINICAL DATA:  Fall  EXAM: PORTABLE CHEST 1 VIEW COMPARISON:  02/06/2022 FINDINGS: Heart and mediastinal contours are within normal limits. No focal opacities or effusions. No acute bony abnormality. IMPRESSION: No active disease. Electronically Signed   By: Charlett Nose M.D.   On: 02/23/2022 21:11   CT HEAD CODE STROKE WO CONTRAST  Result Date: 02/23/2022 CLINICAL DATA:  Code stroke. Initial evaluation for stroke, right-sided deficits. EXAM: CT HEAD WITHOUT CONTRAST TECHNIQUE: Contiguous axial images were obtained from the base of the skull through the vertex without intravenous contrast. RADIATION DOSE REDUCTION: This exam was performed according to the departmental dose-optimization program which includes automated exposure control, adjustment of the mA and/or kV according to patient size and/or use of iterative reconstruction technique. COMPARISON:  Prior CT from 02/22/2019. FINDINGS: Brain: Cerebral volume within normal limits for age. 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 No acute large vessel territory infarct. No acute intracranial hemorrhage. No mass lesion or midline shift. No hydrocephalus or extra-axial fluid collection. Vascular: No asymmetric hyperdense vessel. Skull: Focal soft tissue thickening/swelling present at the central forehead, indeterminate (series 5, image 22). Calvarium intact. Sinuses/Orbits: Globes orbital soft tissues within normal limits. Paranasal sinuses and mastoid air cells are largely clear. Other: None. ASPECTS [facility] Stroke Program Early CT Score) - Ganglionic level infarction (caudate, lentiform nuclei, internal capsule, insula, M1-M3 cortex): 7 - Supraganglionic infarction (M4-M6 cortex): 3 Total score (0-10 with 10 being normal): 10 IMPRESSION: 1. No acute intracranial abnormality. 2. ASPECTS is 10. 3. 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 4. Focal soft tissue  thickening and/or swelling at the central forehead. Correlation with physical exam recommended. These results were communicated to Dr. Wilford Corner at 8:42 pm on 02/23/2022 by text page via the [facility] messaging system. Electronically Signed   By: Rise Mu M.D.   On: 02/23/2022 20:45        Scheduled Meds:   stroke: early stages of recovery book   Does not apply Once   aspirin EC  325 mg Oral Daily   heparin  5,000 Units Subcutaneous Q8H   metoprolol tartrate  50 mg Oral BID   Continuous Infusions:  calcium gluconate     linezolid (ZYVOX) IV 600 mg (02/25/22 1151)   piperacillin-tazobactam (ZOSYN)  IV     sodium bicarbonate 150 mEq in dextrose 5 % 1,150 mL infusion 200 mL/hr at 02/25/22 0740     LOS: 1 day       Huey Bienenstock, MD [facility]   To contact the attending provider between 7A-7P or the covering provider during after hours 7P-7A, please log into the web site [email or web address] and access using universal [place] password for that web site. If you do not have the password, please call the hospital operator.  02/25/2022, 1:05 PM

## 2022-02-25 NOTE — Progress Notes (Signed)
  Echocardiogram 2D Echocardiogram has been performed.  Franklin Anderson 02/25/2022, 2:12 PM

## 2022-02-25 NOTE — Progress Notes (Signed)
PT Cancellation Note  Patient Details Name: Franklin Anderson MRN: [id number] DOB: [birth year]   Cancelled Treatment:    Reason Eval/Treat Not Completed: Pain limiting ability to participate  Spoke with RN who reports pt in significant pain even with rolling.  Also, noted ED notes for ortho to reassess this am for compartment syndrome.  Will f/u at later time as able.  Abran Richard, PT Acute Rehab [facility] [phone]  Karlton Lemon 02/25/2022, 1:00 PM

## 2022-02-26 LAB — CBC
HCT: 34.5 % — ABNORMAL LOW (ref 39.0–52.0)
Hemoglobin: 12.1 g/dL — ABNORMAL LOW (ref 13.0–17.0)
MCH: 32.4 pg (ref 26.0–34.0)
MCHC: 35.1 g/dL (ref 30.0–36.0)
MCV: 92.5 fL (ref 80.0–100.0)
Platelets: 178 10*3/uL (ref 150–400)
RBC: 3.73 MIL/uL — ABNORMAL LOW (ref 4.22–5.81)
RDW: 12.3 % (ref 11.5–15.5)
WBC: 7.5 10*3/uL (ref 4.0–10.5)
nRBC: 0 % (ref 0.0–0.2)

## 2022-02-26 LAB — HEPATITIS PANEL, ACUTE
HCV Ab: REACTIVE — AB
Hep A IgM: NONREACTIVE
Hep B C IgM: NONREACTIVE
Hepatitis B Surface Ag: NONREACTIVE

## 2022-02-26 LAB — COMPREHENSIVE METABOLIC PANEL
ALT: 362 U/L — ABNORMAL HIGH (ref 0–44)
AST: 546 U/L — ABNORMAL HIGH (ref 15–41)
Albumin: 1.9 g/dL — ABNORMAL LOW (ref 3.5–5.0)
Alkaline Phosphatase: 55 U/L (ref 38–126)
Anion gap: 16 — ABNORMAL HIGH (ref 5–15)
BUN: 79 mg/dL — ABNORMAL HIGH (ref 6–20)
CO2: 31 mmol/L (ref 22–32)
Calcium: 6.1 mg/dL — CL (ref 8.9–10.3)
Chloride: 88 mmol/L — ABNORMAL LOW (ref 98–111)
Creatinine, Ser: 7.07 mg/dL — ABNORMAL HIGH (ref 0.61–1.24)
GFR, Estimated: 9 mL/min — ABNORMAL LOW (ref >60)
Glucose, Bld: 131 mg/dL — ABNORMAL HIGH (ref 70–99)
Potassium: 3.7 mmol/L (ref 3.5–5.1)
Sodium: 135 mmol/L (ref 135–145)
Total Bilirubin: 0.8 mg/dL (ref 0.3–1.2)
Total Protein: 4.1 g/dL — ABNORMAL LOW (ref 6.5–8.1)

## 2022-02-26 LAB — CK: Total CK: 25880 U/L — ABNORMAL HIGH (ref 49–397)

## 2022-02-26 LAB — GLUCOSE, CAPILLARY: Glucose-Capillary: 142 mg/dL — ABNORMAL HIGH (ref 70–99)

## 2022-02-26 MED ORDER — CALCIUM GLUCONATE-NACL 1-0.675 GM/50ML-% IV SOLN
1.0000 g | Freq: Once | INTRAVENOUS | Status: AC
  Administered 2022-02-26: 1000 mg via INTRAVENOUS
  Filled 2022-02-26: qty 50

## 2022-02-26 MED ORDER — SODIUM CHLORIDE 0.9 % IV SOLN: INTRAVENOUS | Status: AC

## 2022-02-26 NOTE — Evaluation (Signed)
Occupational Therapy Evaluation Patient Details Name: KUMAR FALWELL MRN: [id number] DOB: [birth year] Today's Date: 02/26/2022   History of Present Illness TARYN NAVE is a 51 y.o. 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  Patient was noted to have significant right-sided swelling, pain, CT of right hip was significant for severe muscle inflammation of the hip/gluteal muscle edema, as well he was noted to have acute renal failure, and work-up significant for CVA.   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 Pt now presents with decreased functional mobility due to RLE pain, as well as decreased balance, strength, and safety awareness. Pt requiring max cues with all mobility to engage in deep breathing for pain management as pt observed with max attempts to hold breath. Pt performing bed mobility with mod A +2 and LB ADL with with max A +2. Pt performing sit<>stand transfers with mod A +2. Pt with scrotal edema, thus, fabricating scrotal sling to reduce pain experience with mobility as well as manage edema. Recommending discharge at [facility] to optimize safety and independence with ADL and IADL.      Recommendations for follow up therapy are one component of a multi-disciplinary discharge planning process, led by the attending physician.  Recommendations may be updated based on patient status, additional functional criteria and insurance authorization.   Follow Up Recommendations  Acute inpatient rehab (3hours/day)    Assistance Recommended at Discharge Frequent or constant Supervision/Assistance  Patient can return home with the following Help with stairs or ramp for entrance;Assist for  transportation;Assistance with cooking/housework;A lot of help with bathing/dressing/bathroom;A lot of help with walking and/or transfers    Functional Status Assessment  Patient has had a recent decline in their functional status and demonstrates the ability to make significant improvements in function in a reasonable and predictable amount of time.  Equipment Recommendations  Other (comment) (TBD next venue)    Recommendations for Other Services Rehab consult     Precautions / Restrictions Precautions Precautions: Fall Precaution Comments: scrotal edema Required Braces or Orthoses: Other Brace Other Brace: OT made scrotal sling Restrictions Weight Bearing Restrictions: No      Mobility Bed Mobility Overal bed mobility: Needs Assistance Bed Mobility: Supine to Sit     Supine to sit: +2 for physical assistance, HOB elevated, Mod assist     General bed mobility comments: assist to scoot hips, move R leg and pt needing HHA to lift trunk upright    Transfers Overall transfer level: Needs assistance Equipment used: Rolling walker (2 wheels) Transfers: Sit to/from Stand Sit to Stand: From elevated surface, +2 physical assistance, Mod assist           General transfer comment: some lifting help to stand at bedside with RW and limited R weight bearing with pain      Balance Overall balance assessment: Needs assistance Sitting-balance support: Feet supported Sitting balance-Leahy Scale: Fair     Standing balance support: Reliant on assistive device for balance, Bilateral upper extremity supported Standing balance-Leahy Scale: Poor Standing balance comment: UE support due to pain                           ADL either performed or  assessed with clinical judgement   ADL Overall ADL's : Needs assistance/impaired Eating/Feeding: Modified independent;Bed level   Grooming: Min guard;Sitting   Upper Body Bathing: Sitting;Minimal assistance   Lower Body  Bathing: Maximal assistance;+2 for physical assistance;+2 for safety/equipment;Sit to/from stand   Upper Body Dressing : Minimal assistance;Sitting   Lower Body Dressing: Maximal assistance;+2 for physical assistance;+2 for safety/equipment;Sit to/from stand   Toilet Transfer: Moderate assistance;+2 for physical assistance;+2 for safety/equipment;BSC/3in1;Rolling walker (2 wheels) Toilet Transfer Details (indicate cue type and reason): Taking side steps with shuffle step toward HOB. Pain limited         Functional mobility during ADLs: Moderate assistance;+2 for physical assistance;+2 for safety/equipment;Rolling walker (2 wheels) General ADL Comments: Mod A and increased time for steps towards TOB. Pt usually independent, so cues for mobility to minimize pain. OT making scrotal sling for pt comfort and to minimize edema     Vision Ability to See in Adequate Light: 0 Adequate Patient Visual Report: No change from baseline Vision Assessment?: No apparent visual deficits Additional Comments: WFL for tasks assessed     Perception     Praxis      Pertinent Vitals/Pain Pain Assessment Pain Assessment: 0-10 Pain Score: 9  Pain Location: R hip Pain Descriptors / Indicators: Sharp, Aching Pain Intervention(s): Limited activity within patient's tolerance, Monitored during session, Repositioned, Premedicated before session     Hand Dominance Right   Extremity/Trunk Assessment Upper Extremity Assessment Upper Extremity Assessment: Generalized weakness (4/5)   Lower Extremity Assessment Lower Extremity Assessment: Defer to PT evaluation RLE Deficits / Details: edematous throughout and held in external rotation with leg elevated on pillows; AAROM limited hip flexion and knee flexion with pt able to activate gluts, quads and calf though tender/painful and tight heel cord   Cervical / Trunk Assessment Cervical / Trunk Assessment: Normal   Communication Communication Communication:  No difficulties   Cognition Arousal/Alertness: Awake/alert Behavior During Therapy: WFL for tasks assessed/performed Overall Cognitive Status: Within Functional Limits for tasks assessed                                 General Comments: Pt reporting his thinking feels normal. Following all commands, increased time but due to pain. Oriented to date, but reports it has been hard to keep up with day of the week.     General Comments  Heat packs applied to groing and outside of hip    Exercises     Shoulder Instructions      Home Living Family/patient expects to be discharged to:: Private residence Living Arrangements: Spouse/significant other Available Help at Discharge: Family;Available 24 hours/day Type of Home: Mobile home Home Access: Stairs to enter Entrance Stairs-Number of Steps: 3 Entrance Stairs-Rails: Right;Left;Can reach both Home Layout: One level     Bathroom Shower/Tub: Chief Strategy Officer: Standard     Home Equipment: Grab bars - tub/shower          Prior Functioning/Environment Prior Level of Function : Independent/Modified Independent             Mobility Comments: no AD ADLs Comments: Independent, was carpenter        OT Problem List: Decreased strength;Decreased activity tolerance;Decreased range of motion;Impaired balance (sitting and/or standing);Decreased knowledge of use of DME or AE;Pain      OT Treatment/Interventions: Self-care/ADL training;Therapeutic exercise;DME and/or AE instruction;Therapeutic activities;Patient/family education;Balance training    OT Goals(Current goals can be  found in the care plan section) Acute Rehab OT Goals Patient Stated Goal: no pain OT Goal Formulation: With patient Time For Goal Achievement: 03/12/22 Potential to Achieve Goals: Good  OT Frequency: Min 2X/week    Co-evaluation PT/OT/SLP Co-Evaluation/Treatment: Yes Reason for Co-Treatment: For patient/therapist safety;To  address functional/ADL transfers PT goals addressed during session: Mobility/safety with mobility OT goals addressed during session: ADL's and self-care;Proper use of Adaptive equipment and DME      AM-PAC OT "6 Clicks" Daily Activity     Outcome Measure Help from another person eating meals?: None Help from another person taking care of personal grooming?: A Little Help from another person toileting, which includes using toliet, bedpan, or urinal?: A Lot Help from another person bathing (including washing, rinsing, drying)?: A Lot Help from another person to put on and taking off regular upper body clothing?: A Little Help from another person to put on and taking off regular lower body clothing?: A Lot 6 Click Score: 16   End of Session Equipment Utilized During Treatment: Gait belt;Rolling walker (2 wheels);Other (comment) (scrotal sling) Nurse Communication: Mobility status  Activity Tolerance: Patient tolerated treatment well Patient left: in bed;with call bell/phone within reach;with bed alarm set  OT Visit Diagnosis: Unsteadiness on feet (R26.81);Muscle weakness (generalized) (M62.81);Other abnormalities of gait and mobility (R26.89);Pain Pain - Right/Left: Right Pain - part of body: Leg;Hip                Time: 4287-6811 OT Time Calculation (min): 48 min Charges:  OT General Charges $OT Visit: 1 Visit OT Evaluation $OT Eval Moderate Complexity: 1 Mod  Shanda Howells, OTR/L [facility] Acute Rehabilitation Office: [phone]   Lula Olszewski 02/26/2022, 1:09 PM

## 2022-02-26 NOTE — Evaluation (Addendum)
Physical Therapy Evaluation Patient Details Name: RAI SEVERNS MRN: [id number] DOB: 12-Dec-1970 Today's Date: 02/26/2022  History of Present Illness  Franklin Anderson is a 51 y.o. 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  Patient was noted to have significant right-sided swelling, pain, CT of right hip was significant for severe muscle inflammation of the hip/gluteal muscle edema, as well he was noted to have acute renal failure, and work-up significant for CVA.  Clinical Impression  Patient presents with decreased mobility due to pain in R LE and decreased strength/ROM and decreased safety awareness.  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  Currently mod A of 2 for up to EOB and to stand briefly with RW.  He was medicated but has significant R hip pain and nausea today with mobility.  Hopeful for improvement with continued attempts, but he may need acute inpatient rehab prior to d/c home with wife assist.  PT will continue to follow acutely.        Recommendations for follow up therapy are one component of a multi-disciplinary discharge planning process, led by the attending physician.  Recommendations may be updated based on patient status, additional functional criteria and insurance authorization.  Follow Up Recommendations Acute inpatient rehab (3hours/day)      Assistance Recommended at Discharge Intermittent Supervision/Assistance  Patient can return home with the following  Two people to help with walking and/or transfers;Two people to help with bathing/dressing/bathroom    Equipment Recommendations BSC/3in1;Rolling walker (2 wheels);Wheelchair (measurements PT)   Recommendations for Other Services  Rehab consult    Functional Status Assessment Patient has had a recent decline in their functional status and demonstrates the ability to make significant improvements in function in a reasonable and predictable amount of time.     Precautions / Restrictions Precautions Precautions: Fall Precaution Comments: scrotal edema Required Braces or Orthoses: Other Brace Other Brace: OT made scrotal sling      Mobility  Bed Mobility Overal bed mobility: Needs Assistance Bed Mobility: Supine to Sit     Supine to sit: +2 for physical assistance, HOB elevated, Mod assist     General bed mobility comments: assist to scoot hips, move R leg and pt needing HHA to lift trunk upright    Transfers Overall transfer level: Needs assistance Equipment used: Rolling walker (2 wheels) Transfers: Sit to/from Stand Sit to Stand: From elevated surface, +2 physical assistance, Mod assist           General transfer comment: some lifting help to stand at bedside with RW and limited R weight bearing with pain    Ambulation/Gait Ambulation/Gait assistance: Mod assist Gait Distance (Feet): 1 Feet Assistive device: Rolling walker (2 wheels)         General Gait Details: "shimmy" steps with L toward HOB x 2  Stairs            Wheelchair Mobility    Modified Rankin (Stroke Patients Only)       Balance Overall balance assessment: Needs assistance Sitting-balance support: Feet supported Sitting balance-Leahy Scale: Fair     Standing balance support: Reliant on assistive device for balance, Bilateral upper extremity supported Standing balance-Leahy Scale: Poor Standing balance comment: UE support due  to pain                             Pertinent Vitals/Pain Pain Assessment Pain Assessment: 0-10 Pain Score: 9  Pain Location: R hip Pain Descriptors / Indicators: Sharp, Aching Pain Intervention(s): Monitored during session,  Premedicated before session, Repositioned    Home Living Family/patient expects to be discharged to:: Private residence Living Arrangements: Spouse/significant other Available Help at Discharge: Family;Available 24 hours/day Type of Home: Mobile home Home Access: Stairs to enter Entrance Stairs-Rails: Right;Left;Can reach both Entrance Stairs-Number of Steps: 3   Home Layout: One level Home Equipment: Grab bars - tub/shower      Prior Function Prior Level of Function : Independent/Modified Independent             Mobility Comments: no AD ADLs Comments: Independent, was Research scientist (physical sciences) Dominance        Extremity/Trunk Assessment   Upper Extremity Assessment Upper Extremity Assessment: Defer to OT evaluation    Lower Extremity Assessment Lower Extremity Assessment: RLE deficits/detail RLE Deficits / Details: edematous throughout and held in external rotation with leg elevated on pillows; AAROM limited hip flexion and knee flexion with pt able to activate gluts, quads and calf though tender/painful and tight heel cord       Communication      Cognition Arousal/Alertness: Awake/alert Behavior During Therapy: WFL for tasks assessed/performed Overall Cognitive Status: Within Functional Limits for tasks assessed                                          General Comments General comments (skin integrity, edema, etc.): Heat packs applied to groing and outside of hip    Exercises General Exercises - Lower Extremity Ankle Circles/Pumps: AAROM, Right, 5 reps, Supine Quad Sets: AROM, 5 reps, Right, Supine Gluteal Sets: AROM, 5 reps, Right, Supine, Both   Assessment/Plan    PT Assessment Patient needs continued PT services  PT Problem List Decreased strength;Decreased balance;Decreased knowledge of precautions;Decreased knowledge of use of DME;Decreased mobility;Decreased range of motion;Decreased activity tolerance;Decreased safety awareness        PT Treatment Interventions DME instruction;Balance training;Functional mobility training;Patient/family education;Therapeutic activities;Gait training;Stair training;Therapeutic exercise    PT Goals (Current goals can be found in the Care Plan section)  Acute Rehab PT Goals Patient Stated Goal: to return to independent PT Goal Formulation: With patient Time For Goal Achievement: 03/12/22 Potential to Achieve Goals: Good    Frequency Min 4X/week     Co-evaluation PT/OT/SLP Co-Evaluation/Treatment: Yes Reason for Co-Treatment: To address functional/ADL transfers PT goals addressed during session: Mobility/safety with mobility;Proper use of DME;Strengthening/ROM         AM-PAC PT "6 Clicks" Mobility  Outcome Measure Help needed turning from your back to your side while in a flat bed without using bedrails?: Total Help needed moving from lying on your back to sitting on the side of a flat bed without using bedrails?: Total Help needed moving to and from a bed to a chair (including a wheelchair)?: Total Help needed standing up from a chair using your arms (e.g., wheelchair or bedside chair)?: Total Help needed to walk in hospital room?: Total Help needed climbing 3-5 steps with a railing? : Total 6 Click Score: 6    End of Session Equipment Utilized During Treatment: Gait belt Activity Tolerance:  Patient limited by pain Patient left: in bed;with call bell/phone within reach Nurse Communication: Patient requests pain meds PT Visit Diagnosis: Difficulty in walking, not elsewhere classified (R26.2);Pain;History of falling (Z91.81) Pain - Right/Left: Right Pain - part of body: Hip    Time: 0174-9449 PT Time Calculation (min) (ACUTE ONLY): 44 min   Charges:   PT Evaluation $PT Eval Moderate Complexity: 1 Mod PT Treatments $Therapeutic Activity: 8-22 mins        Magda Kiel, PT Acute Rehabilitation Services Office:[phone] 02/26/2022   Reginia Naas 02/26/2022,  12:35 PM

## 2022-02-26 NOTE — Progress Notes (Signed)
Inpatient Rehab Admissions:  Inpatient Rehab Consult received.  I met with patient at the bedside for rehabilitation assessment and to discuss goals and expectations of an inpatient rehab admission.  Discussed average length of stay, discharge home after completion of CIR, and room & board cost of CIR for an uninsured pt. Pt acknowledged understanding. Pt interested in pursuing CIR. Pt requested AC contact financial counseling. Sent a message to [facility]. Pt gave permission to contact wife, Diane. Spoke with Diane on the telephone. She acknowledged understanding of CIR goals and expectations. She is supportive of pt pursuing CIR. Will continue to follow.  Signed: Gayland Curry, [place], [place] Admissions Coordinator [phone]

## 2022-02-26 NOTE — Progress Notes (Signed)
? ?  Inpatient Rehab Admissions Coordinator : ? ?Per therapy recommendations, patient was screened for CIR candidacy by Briana Farner RN MSN.  At this time patient appears to be a potential candidate for CIR. I will place a rehab consult per protocol for full assessment. Please call me with any questions. ? ?Srinivas Lippman RN MSN ?Admissions Coordinator ?[phone] ?  ?

## 2022-02-26 NOTE — Progress Notes (Addendum)
PROGRESS NOTE    Franklin Anderson  WJX:[id number] DOB: Oct 06, 1970 DOA: 02/23/2022 PCP: Cain Saupe, MD   Chief Complaint  Patient presents with   Code Stroke    Brief Narrative:    Franklin Anderson is a 51 y.o. male with medical history significant of polysubstance abuse, essential hypertension , rheumatic fever with subsequent heart murmur who presents to ED BIB EMS after being found down at home in his own feces,after falling out of bed last pm.  Arrival to ED patient was noted to have significant right-sided swelling, pain, CT of right hip was significant for severe muscle inflammation of the hip/gluteal muscle edema, as well he was noted to have acute renal failure, and work-up significant for CVA.  Assessment & Plan:   Principal Problem:   Change in mental status Active Problems:   Uncontrolled hypertension   Polysubstance abuse (HCC)  AKI Severe rhabdomyolysis -Renal input greatly appreciated, total CK improving on IV fluids -Unfortunately creatinine continues to trend up, it is 7.07 today, but potassium has been stable, will await further recommendation from renal regarding need for RRT. -Continue with Foley catheter -Bicarb is 31 today, so bicarb drip has been discontinued. -Further management per renal   Acute Metabolic  Encephalopathy  -due to polysubstance abuse and acute CVA . -Mental status significantly improved .  Hyperkalemia -Improved, hold Lokelma  Hypocalcemia -Replete as needed  Hypertension -Blood pressure significantly elevated, (patient is out of permissive HTN period per neuro note) -Currently improved on clonidine and amlodipine, continue with as needed hydralazine.    Acute CVA -He denies any IV drug abuse, -Neurology on board  Polysubstance abuse - CIWA protocol -Patient denies any IV drug abuse, reports he just snorted, he is adamant that no IV drug abuse since 2020.    Elevated LFTS: -Most likely due to above, trending down. -HCV  AB is positive, but has been in the past, PCR has been negative and 2020.  Marland Kitchen   Severe muscle inflammation of right hip and gluteal muscle/crush injury -Is most likely due to severe rhabdomyolysis/myositis/ crush injury-most likely in the setting of cocaine use, as well possibly related to laying on the floor for unknown period of time  -No orthopedic surgery recommended at this time per orthopedic .     DVT prophylaxis: Hollins heaprin Code Status: Full Family Communication: none at bedside Disposition:   Status is: Inpatient    Consultants:  Neurology Nephrology Orthopedic   Subjective:  No significant events overnight, still reports right lower extremity pain, but appears to be improving.  Objective: Vitals:   02/26/22 0307 02/26/22 0500 02/26/22 0802 02/26/22 1207  BP: (!) 154/104  (!) 161/95 (!) 147/97  Pulse: 78  77 76  Resp: Temp: 98 F (36.7 C)  98 F (36.7 C) 98.3 F (36.8 C)  TempSrc: Oral  Oral Oral  SpO2: 93%  91% 91%  Weight:  77.3 kg    Height:        Intake/Output Summary (Last 24 hours) at 02/26/2022 1213 Last data filed at 02/26/2022 0803 Gross per 24 hour  Intake 4124.76 ml  Output 1751 ml  Net 2373.76 ml   Filed Weights   02/23/22 2046 02/26/22 0500  Weight: 56.7 kg 77.3 kg    Examination:   Awake Alert, Oriented X 3, frail, ill-appearing, appears older than stated age Symmetrical Chest wall movement, Good air movement bilaterally, CTAB RRR,No Gallops,Rubs or new Murmurs, No Parasternal Heave +ve B.Sounds, Abd  Soft, No tenderness, No rebound - guarding or rigidity. Significant right lower extremity edema, minimal tenderness to palpation today, has good distal pulses, has scrotal edema.    Data Reviewed: I have personally reviewed following labs and imaging studies  CBC: Recent Labs  Lab 02/23/22 2008 02/23/22 2016 02/24/22 0205 02/24/22 0252 02/24/22 0338 02/24/22 1950 02/25/22 0332 02/26/22 0349  WBC 16.4*  --  15.6*   --  16.0*  --  9.8 7.5  NEUTROABS 14.3*  --   --   --   --   --   --   --   HGB 17.3*   < > 15.7 15.6 17.2* 13.9 13.4 12.1*  HCT 53.1*   < > 47.8 46.0 50.2 41.0 38.1* 34.5*  MCV 97.8  --  98.6  --  96.5  --  92.3 92.5  PLT 349  --  266  --  283  --  198 178   < > = values in this interval not displayed.    Basic Metabolic Panel: Recent Labs  Lab 02/24/22 0338 02/24/22 1231 02/24/22 1947 02/24/22 1950 02/25/22 0332 02/26/22 0349  NA 138 138 136 134* 133* 135  K 6.1* 5.7* 4.3 4.2 3.6 3.7  CL 107 105 99  --  95* 88*  CO2 18* 19* 20*  --  23 31  GLUCOSE 126* 95 156*  --  162* 131*  BUN 62* 71* 75*  --  74* 79*  CREATININE 3.53* 4.12* 4.77*  --  5.41* 7.07*  CALCIUM 6.5* 6.8* 6.7*  --  6.4* 6.1*  PHOS  --  6.6* 6.0*  --   --   --     GFR: Estimated Creatinine Clearance: 13.5 mL/min (A) (by C-G formula based on SCr of 7.07 mg/dL (H)).  Liver Function Tests: Recent Labs  Lab 02/23/22 2008 02/24/22 0205 02/24/22 0338 02/24/22 1231 02/24/22 1947 02/25/22 0332 02/26/22 0349  AST 1,214* 1,134* 1,173*  --   --  645* 546*  ALT 700* 596* 627*  --   --  440* 362*  ALKPHOS 101 78 89  --   --  62 55  BILITOT 0.4 0.6 0.7  --   --  0.6 0.8  PROT 7.6 5.7* 6.2*  --   --  4.2* 4.1*  ALBUMIN 4.2 3.0* 3.1* 2.7* 2.3* 2.1* 1.9*    CBG: Recent Labs  Lab 02/23/22 2008 02/24/22 0747 02/26/22 0826  GLUCAP 142* 131* 142*     Recent Results (from the past 240 hour(s))  Urine Culture     Status: None   Collection Time: 02/23/22 11:23 PM   Specimen: Urine, Clean Catch  Result Value Ref Range Status   Specimen Description URINE, CLEAN CATCH  Final   Special Requests NONE  Final   Culture   Final    NO GROWTH Performed at [facility], [address], [place], [place] [address]    Report Status 02/25/2022 FINAL  Final  Culture, blood (Routine X 2) w Reflex to ID Panel     Status: None (Preliminary result)   Collection Time: 02/24/22  3:38 AM   Specimen: BLOOD LEFT HAND   Result Value Ref Range Status   Specimen Description BLOOD LEFT HAND  Final   Special Requests   Final    BOTTLES DRAWN AEROBIC ONLY Blood Culture results may not be optimal due to an inadequate volume of blood received in culture bottles   Culture   Final    NO  GROWTH 2 DAYS Performed at [facility], [address], [place], [place] [address]    Report Status PENDING  Incomplete  Culture, blood (Routine X 2) w Reflex to ID Panel     Status: None (Preliminary result)   Collection Time: 02/24/22  3:41 AM   Specimen: BLOOD  Result Value Ref Range Status   Specimen Description BLOOD LEFT ANTECUBITAL  Final   Special Requests   Final    BOTTLES DRAWN AEROBIC AND ANAEROBIC Blood Culture adequate volume   Culture   Final    NO GROWTH 2 DAYS Performed at [facility], [address], [place], [place] [address]    Report Status PENDING  Incomplete         Radiology Studies: VAS US CAROTID  Result Date: 02/26/2022 Carotid Arterial Duplex Study Patient Name:  Franklin Anderson  Date of Exam:   02/25/2022 Medical Rec #: [id number]       Accession #:    [id number] Date of Birth: [birth year]      Patient Gender: M Patient Age:   79 years Exam Location:  [facility] Procedure:      VAS US CAROTID Referring Phys: Scheryl Marten XU --------------------------------------------------------------------------------  Indications:       CVA. Risk Factors:      Hypertension. Comparison Study:  no prior Performing Technologist: Argentina Ponder RVS  Examination Guidelines: A complete evaluation includes B-mode imaging, spectral Doppler, color Doppler, and power Doppler as needed of all accessible portions of each vessel. Bilateral testing is considered an integral part of a complete examination. Limited examinations for reoccurring indications may be performed as noted.  Right Carotid Findings: +----------+--------+--------+--------+------------------+--------+           PSV cm/sEDV  cm/sStenosisPlaque DescriptionComments +----------+--------+--------+--------+------------------+--------+ CCA Prox  90      29              heterogenous               +----------+--------+--------+--------+------------------+--------+ CCA Distal57      24              heterogenous               +----------+--------+--------+--------+------------------+--------+ ICA Prox  53      21              heterogenous               +----------+--------+--------+--------+------------------+--------+ ICA Distal71      30                                         +----------+--------+--------+--------+------------------+--------+ ECA       81      27                                         +----------+--------+--------+--------+------------------+--------+ +----------+--------+-------+--------+-------------------+           PSV cm/sEDV cmsDescribeArm Pressure (mmHG) +----------+--------+-------+--------+-------------------+ Subclavian89                                         +----------+--------+-------+--------+-------------------+ +---------+--------+--+--------+--+---------+ VertebralPSV cm/s41EDV cm/s18Antegrade +---------+--------+--+--------+--+---------+  Left Carotid Findings: +----------+--------+--------+--------+------------------+--------+  PSV cm/sEDV cm/sStenosisPlaque DescriptionComments +----------+--------+--------+--------+------------------+--------+ CCA Prox  93      29              heterogenous               +----------+--------+--------+--------+------------------+--------+ CCA Distal73      27              heterogenous               +----------+--------+--------+--------+------------------+--------+ ICA Prox  64      35              heterogenous               +----------+--------+--------+--------+------------------+--------+ ICA Distal83      40                                          +----------+--------+--------+--------+------------------+--------+ ECA       91      23                                         +----------+--------+--------+--------+------------------+--------+ +----------+--------+--------+--------+-------------------+           PSV cm/sEDV cm/sDescribeArm Pressure (mmHG) +----------+--------+--------+--------+-------------------+ [id number]                                          +----------+--------+--------+--------+-------------------+ +---------+--------+--+--------+--+---------+ VertebralPSV cm/s35EDV cm/s10Antegrade +---------+--------+--+--------+--+---------+   Summary: Right Carotid: The extracranial vessels were near-normal with only minimal wall                thickening or plaque. Left Carotid: The extracranial vessels were near-normal with only minimal wall               thickening or plaque. Vertebrals: Bilateral vertebral arteries demonstrate antegrade flow. *See table(s) above for measurements and observations.  Electronically signed by Delia Heady MD on 02/26/2022 at 8:04:03 AM.    Final    ECHOCARDIOGRAM COMPLETE  Result Date: 02/25/2022    ECHOCARDIOGRAM REPORT   Patient Name:   Franklin Anderson Date of Exam: 02/25/2022 Medical Rec #:  [id number]      Height:       72.0 in Accession #:    [id number]     Weight:       125.0 lb Date of Birth:  27-Dec-1970     BSA:          1.745 m Patient Age:    50 years       BP:           174/116 mmHg Patient Gender: M              HR:           83 bpm. Exam Location:  Inpatient Procedure: 2D Echo, Color Doppler and Cardiac Doppler Indications:    Stroke  History:        Patient has no prior history of Echocardiogram examinations.                 Signs/Symptoms:Murmur; Risk Factors:Hypertension, Family History                 of Coronary Artery Disease and Current Smoker. Polysubstance  abuse, rheumatic fever (as a child).  Sonographer:    Milda Smart Referring Phys: [id number]  JYNWGNF XU  Sonographer Comments: Image acquisition challenging due to patient body habitus and Image acquisition challenging due to respiratory motion. IMPRESSIONS  1. Left ventricular ejection fraction, by estimation, is 55%. The left ventricle has normal function. The left ventricle has no regional wall motion abnormalities. There is mild concentric left ventricular hypertrophy. Left ventricular diastolic parameters are consistent with Grade I diastolic dysfunction (impaired relaxation).  2. Right ventricular systolic function is normal. The right ventricular size is normal. Tricuspid regurgitation signal is inadequate for assessing PA pressure.  3. The mitral valve is normal in structure. Trivial mitral valve regurgitation. No evidence of mitral stenosis.  4. The aortic valve is tricuspid. Aortic valve regurgitation is trivial. No aortic stenosis is present.  5. The inferior vena cava is normal in size with greater than 50% respiratory variability, suggesting right atrial pressure of 3 mmHg. FINDINGS  Left Ventricle: Left ventricular ejection fraction, by estimation, is 55%. The left ventricle has normal function. The left ventricle has no regional wall motion abnormalities. The left ventricular internal cavity size was normal in size. There is mild concentric left ventricular hypertrophy. Left ventricular diastolic parameters are consistent with Grade I diastolic dysfunction (impaired relaxation). Right Ventricle: The right ventricular size is normal. No increase in right ventricular wall thickness. Right ventricular systolic function is normal. Tricuspid regurgitation signal is inadequate for assessing PA pressure. Left Atrium: Left atrial size was normal in size. Right Atrium: Right atrial size was normal in size. Pericardium: Trivial pericardial effusion is present. Mitral Valve: The mitral valve is normal in structure. Trivial mitral valve regurgitation. No evidence of mitral valve stenosis. MV peak  gradient, 4.0 mmHg. The mean mitral valve gradient is 2.0 mmHg. Tricuspid Valve: The tricuspid valve is normal in structure. Tricuspid valve regurgitation is not demonstrated. Aortic Valve: The aortic valve is tricuspid. Aortic valve regurgitation is trivial. No aortic stenosis is present. Pulmonic Valve: The pulmonic valve was normal in structure. Pulmonic valve regurgitation is not visualized. Aorta: The aortic root is normal in size and structure. Venous: The inferior vena cava is normal in size with greater than 50% respiratory variability, suggesting right atrial pressure of 3 mmHg. IAS/Shunts: No atrial level shunt detected by color flow Doppler.  LEFT VENTRICLE PLAX 2D LVIDd:         4.50 cm      Diastology LVIDs:         4.00 cm      LV e' medial:    8.55 cm/s LV PW:         1.20 cm      LV E/e' medial:  7.8 LV IVS:        0.90 cm      LV e' lateral:   8.86 cm/s LVOT diam:     2.10 cm      LV E/e' lateral: 7.5 LV SV:         67 LV SV Index:   38 LVOT Area:     3.46 cm  LV Volumes (MOD) LV vol d, MOD A2C: 93.4 ml LV vol d, MOD A4C: 149.0 ml LV vol s, MOD A2C: 52.3 ml LV vol s, MOD A4C: 84.5 ml LV SV MOD A2C:     41.1 ml LV SV MOD A4C:     149.0 ml LV SV MOD BP:      48.2 ml RIGHT VENTRICLE  IVC RV S prime:     15.50 cm/s  IVC diam: 2.00 cm TAPSE (M-mode): 1.8 cm LEFT ATRIUM             Index        RIGHT ATRIUM           Index LA diam:        2.90 cm 1.66 cm/m   RA Area:     13.90 cm LA Vol (A2C):   63.8 ml 36.57 ml/m  RA Volume:   34.50 ml  19.77 ml/m LA Vol (A4C):   28.5 ml 16.33 ml/m LA Biplane Vol: 43.1 ml 24.70 ml/m  AORTIC VALVE LVOT Vmax:   132.00 cm/s LVOT Vmean:  87.200 cm/s LVOT VTI:    0.193 m  AORTA Ao Root diam: 2.90 cm Ao Asc diam:  3.20 cm MITRAL VALVE MV Area (PHT): 3.06 cm    SHUNTS MV Area VTI:   2.88 cm    Systemic VTI:  0.19 m MV Peak grad:  4.0 mmHg    Systemic Diam: 2.10 cm MV Mean grad:  2.0 mmHg MV Vmax:       1.00 m/s MV Vmean:      64.2 cm/s MV Decel Time: 248  msec MV E velocity: 66.70 cm/s MV A velocity: 68.90 cm/s MV E/A ratio:  0.97 Dalton McleanMD Electronically signed by Franki Monte Signature Date/Time: 02/25/2022/3:04:29 PM    Final    VAS [place] LOWER EXTREMITY VENOUS (DVT)  Result Date: 02/25/2022  Lower Venous DVT Study Patient Name:  Franklin Anderson  Date of Exam:   02/25/2022 Medical Rec #: [id number]       Accession #:    [id number] Date of Birth: 04/25/1971      Patient Gender: M Patient Age:   67 years Exam Location:  [facility] Procedure:      VAS [place] LOWER EXTREMITY VENOUS (DVT) Referring Phys: Cornelius Moras XU --------------------------------------------------------------------------------  Indications: Stroke.  Comparison Study: prior 03/01/19 Performing Technologist: Archie Patten RVS  Examination Guidelines: A complete evaluation includes B-mode imaging, spectral Doppler, color Doppler, and power Doppler as needed of all accessible portions of each vessel. Bilateral testing is considered an integral part of a complete examination. Limited examinations for reoccurring indications may be performed as noted. The reflux portion of the exam is performed with the patient in reverse Trendelenburg.  +--------+---------------+---------+-----------+----------+--------------------+ RIGHT   CompressibilityPhasicitySpontaneityPropertiesThrombus Aging       +--------+---------------+---------+-----------+----------+--------------------+ CFV     Full           Yes      Yes                                       +--------+---------------+---------+-----------+----------+--------------------+ SFJ     Full                                                              +--------+---------------+---------+-----------+----------+--------------------+ FV Prox Full                                                              +--------+---------------+---------+-----------+----------+--------------------+  FV Mid  Full           Yes      Yes                                        +--------+---------------+---------+-----------+----------+--------------------+ FV      Full                                                              Distal                                                                    +--------+---------------+---------+-----------+----------+--------------------+ PFV     Full                                                              +--------+---------------+---------+-----------+----------+--------------------+ POP     Full           Yes      Yes                                       +--------+---------------+---------+-----------+----------+--------------------+ PTV     Full           Yes      Yes                  patent by color                                                           doppler              +--------+---------------+---------+-----------+----------+--------------------+ PERO    Full           Yes      Yes                  patent by color                                                           doppler              +--------+---------------+---------+-----------+----------+--------------------+   +---------+---------------+---------+-----------+----------+--------------+ LEFT     CompressibilityPhasicitySpontaneityPropertiesThrombus Aging +---------+---------------+---------+-----------+----------+--------------+ CFV      Full           Yes      Yes                                 +---------+---------------+---------+-----------+----------+--------------+  SFJ      Full                                                        +---------+---------------+---------+-----------+----------+--------------+ FV Prox  Full                                                        +---------+---------------+---------+-----------+----------+--------------+ FV Mid   Full                                                         +---------+---------------+---------+-----------+----------+--------------+ FV DistalFull                                                        +---------+---------------+---------+-----------+----------+--------------+ PFV      Full                                                        +---------+---------------+---------+-----------+----------+--------------+ POP      Full           Yes      Yes                                 +---------+---------------+---------+-----------+----------+--------------+ PTV      Full                                                        +---------+---------------+---------+-----------+----------+--------------+ PERO     Full                                                        +---------+---------------+---------+-----------+----------+--------------+     Summary: BILATERAL: - No evidence of deep vein thrombosis seen in the lower extremities, bilaterally. -No evidence of popliteal cyst, bilaterally.   *See table(s) above for measurements and observations. Electronically signed by Sherald Hess MD on 02/25/2022 at 12:07:47 PM.    Final    MR ANGIO HEAD WO CONTRAST  Result Date: 02/25/2022 CLINICAL DATA:  Stroke, follow up EXAM: MRA HEAD WITHOUT CONTRAST TECHNIQUE: Angiographic images of the Circle of Willis were acquired using MRA technique without intravenous contrast. COMPARISON:  MRI head February 24, 2022. FINDINGS: Anterior circulation: Bilateral intracranial ICAs, MCAs, and ACAs are patent without proximal hemodynamically significant stenosis. Posterior  circulation: Bilateral intradural vertebral arteries, basilar artery and bilateral posterior cerebral arteries are patent without proximal hemodynamically significant stenosis. Left fetal type PCA, anatomic variant. IMPRESSION: No emergent large vessel occlusion or proximal hemodynamically significant stenosis. Electronically Signed   By: Feliberto HartsFrederick S Jones M.D.   On: 02/25/2022 09:01         Scheduled Meds:  amLODipine  10 mg Oral Daily   aspirin EC  325 mg Oral Daily   cloNIDine  0.1 mg Oral BID   heparin  5,000 Units Subcutaneous Q8H   metoprolol tartrate  50 mg Oral BID   Continuous Infusions:  linezolid (ZYVOX) IV 600 mg (02/26/22 0944)   piperacillin-tazobactam (ZOSYN)  IV 2.25 g (02/26/22 0503)     LOS: 2 days       Huey Bienenstockawood Genice Kimberlin, MD [facility]   To contact the attending provider between 7A-7P or the covering provider during after hours 7P-7A, please log into the web site [email or web address] and access using universal [place] password for that web site. If you do not have the password, please call the hospital operator.  02/26/2022, 12:13 PM

## 2022-02-26 NOTE — Progress Notes (Addendum)
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  Making urine but may not be enough to get by without dialysis.  I discussed this with the pt and he is in agreement if we need to proceed - swtich bicarb to NS - serial BMP checks - Foley - may need RRT - aggressive K management as below--> now normalized - if Cr continues to uptrend we may need to do HD but for now no indication--> not uremic and looks like rate of rise decreasing   2.  Hyperkalemia: rhabdo + losartan + NSAIDs             - calcium, insulin/ dextrose in ED             - s/p bicarb gtt             - s/p albuterol 10 mg neb             - s/p lokelma TID             - if no improvement and UOP not great will do RRT - on empiric antibiotics with linezolid and zosyn   3.  Hypocalcemia:             - raggressive repletion   4.  HTN             - holding losartan   5.  Polysubstance abuse             - CIWA protocol   6.  Elevated LFTS:             - likely d/t rhabdo, consider RUQ imaging, exam benign, per primary  7.  Acute CVA  - workup per neuro   8.  Dispo: admitted  Subjective:    Seen in room.  Cr continues to rise but rate of rise decreasing. UOP OK.  Will switch to isotonic saline   Objective:   BP (!) 147/97 (BP Location: Right Arm)   Pulse 76   Temp 98.3 F (36.8 C) (Oral)   Resp 18   Ht 6' (1.829 m)   Wt 77.3 kg   SpO2 91%   BMI 23.11 kg/m   Intake/Output Summary (Last 24 hours) at 02/26/2022 1229 Last data filed at 02/26/2022 0803 Gross per 24 hour  Intake 4124.76 ml  Output 1751 ml  Net 2373.76 ml   Weight change:   Physical Exam: GEN: awake and alert, sitting in bed HEENT EOMI PERRL, poor dentition NECK flat neck veins PULM clear CV tachycardic ABD thin EXT no edema NEURO AAO x 3 SKIN dry, ruddy complexion, mult tattooos MSK severe R hip pain, can't move it, + swelling today  Imaging: VAS US  CAROTID  Result Date: 02/26/2022 Carotid Arterial Duplex Study Patient Name:  WAYNE WICKLUND Baar  Date of Exam:   02/25/2022 Medical Rec #: [id number]       Accession #:    [id number] Date of Birth: 1971-04-08      Patient Gender: M Patient Age:   51 years Exam Location:  [facility] Procedure:      VAS US CAROTID Referring Phys: Scheryl Marten XU --------------------------------------------------------------------------------  Indications:       CVA. Risk Factors:      Hypertension. Comparison Study:  no prior Performing Technologist: [place] Ponder RVS  Examination Guidelines: A complete evaluation includes B-mode imaging, spectral Doppler, color Doppler, and power Doppler as needed of  all accessible portions of each vessel. Bilateral testing is considered an integral part of a complete examination. Limited examinations for reoccurring indications may be performed as noted.  Right Carotid Findings: +----------+--------+--------+--------+------------------+--------+           PSV cm/sEDV cm/sStenosisPlaque DescriptionComments +----------+--------+--------+--------+------------------+--------+ CCA Prox  90      29              heterogenous               +----------+--------+--------+--------+------------------+--------+ CCA Distal57      24              heterogenous               +----------+--------+--------+--------+------------------+--------+ ICA Prox  53      21              heterogenous               +----------+--------+--------+--------+------------------+--------+ ICA Distal71      30                                         +----------+--------+--------+--------+------------------+--------+ ECA       81      27                                         +----------+--------+--------+--------+------------------+--------+ +----------+--------+-------+--------+-------------------+           PSV cm/sEDV cmsDescribeArm Pressure (mmHG)  +----------+--------+-------+--------+-------------------+ Subclavian89                                         +----------+--------+-------+--------+-------------------+ +---------+--------+--+--------+--+---------+ VertebralPSV cm/s41EDV cm/s18Antegrade +---------+--------+--+--------+--+---------+  Left Carotid Findings: +----------+--------+--------+--------+------------------+--------+           PSV cm/sEDV cm/sStenosisPlaque DescriptionComments +----------+--------+--------+--------+------------------+--------+ CCA Prox  93      29              heterogenous               +----------+--------+--------+--------+------------------+--------+ CCA Distal73      27              heterogenous               +----------+--------+--------+--------+------------------+--------+ ICA Prox  64      35              heterogenous               +----------+--------+--------+--------+------------------+--------+ ICA Distal83      40                                         +----------+--------+--------+--------+------------------+--------+ ECA       91      23                                         +----------+--------+--------+--------+------------------+--------+ +----------+--------+--------+--------+-------------------+           PSV cm/sEDV cm/sDescribeArm Pressure (mmHG) +----------+--------+--------+--------+-------------------+ [id number]                                          +----------+--------+--------+--------+-------------------+ +---------+--------+--+--------+--+---------+  VertebralPSV cm/s35EDV cm/s10Antegrade +---------+--------+--+--------+--+---------+   Summary: Right Carotid: The extracranial vessels were near-normal with only minimal wall                thickening or plaque. Left Carotid: The extracranial vessels were near-normal with only minimal wall               thickening or plaque. Vertebrals: Bilateral vertebral arteries  demonstrate antegrade flow. *See table(s) above for measurements and observations.  Electronically signed by Antony Contras MD on 02/26/2022 at 8:04:03 AM.    Final    ECHOCARDIOGRAM COMPLETE  Result Date: 02/25/2022    ECHOCARDIOGRAM REPORT   Patient Name:   ZIAIR PENSON Ige Date of Exam: 02/25/2022 Medical Rec #:  [id number]      Height:       72.0 in Accession #:    [id number]     Weight:       125.0 lb Date of Birth:  November 22, 1970     BSA:          1.745 m Patient Age:    56 years       BP:           174/116 mmHg Patient Gender: M              HR:           83 bpm. Exam Location:  Inpatient Procedure: 2D Echo, Color Doppler and Cardiac Doppler Indications:    Stroke  History:        Patient has no prior history of Echocardiogram examinations.                 Signs/Symptoms:Murmur; Risk Factors:Hypertension, Family History                 of Coronary Artery Disease and Current Smoker. Polysubstance                 abuse, rheumatic fever (as a child).  Sonographer:    Eartha Inch Referring Phys: [id number] HALPFXT XU  Sonographer Comments: Image acquisition challenging due to patient body habitus and Image acquisition challenging due to respiratory motion. IMPRESSIONS  1. Left ventricular ejection fraction, by estimation, is 55%. The left ventricle has normal function. The left ventricle has no regional wall motion abnormalities. There is mild concentric left ventricular hypertrophy. Left ventricular diastolic parameters are consistent with Grade I diastolic dysfunction (impaired relaxation).  2. Right ventricular systolic function is normal. The right ventricular size is normal. Tricuspid regurgitation signal is inadequate for assessing PA pressure.  3. The mitral valve is normal in structure. Trivial mitral valve regurgitation. No evidence of mitral stenosis.  4. The aortic valve is tricuspid. Aortic valve regurgitation is trivial. No aortic stenosis is present.  5. The inferior vena cava is normal in size with  greater than 50% respiratory variability, suggesting right atrial pressure of 3 mmHg. FINDINGS  Left Ventricle: Left ventricular ejection fraction, by estimation, is 55%. The left ventricle has normal function. The left ventricle has no regional wall motion abnormalities. The left ventricular internal cavity size was normal in size. There is mild concentric left ventricular hypertrophy. Left ventricular diastolic parameters are consistent with Grade I diastolic dysfunction (impaired relaxation). Right Ventricle: The right ventricular size is normal. No increase in right ventricular wall thickness. Right ventricular systolic function is normal. Tricuspid regurgitation signal is inadequate for assessing PA pressure. Left Atrium: Left atrial size was normal in size. Right Atrium: Right atrial size was normal in  size. Pericardium: Trivial pericardial effusion is present. Mitral Valve: The mitral valve is normal in structure. Trivial mitral valve regurgitation. No evidence of mitral valve stenosis. MV peak gradient, 4.0 mmHg. The mean mitral valve gradient is 2.0 mmHg. Tricuspid Valve: The tricuspid valve is normal in structure. Tricuspid valve regurgitation is not demonstrated. Aortic Valve: The aortic valve is tricuspid. Aortic valve regurgitation is trivial. No aortic stenosis is present. Pulmonic Valve: The pulmonic valve was normal in structure. Pulmonic valve regurgitation is not visualized. Aorta: The aortic root is normal in size and structure. Venous: The inferior vena cava is normal in size with greater than 50% respiratory variability, suggesting right atrial pressure of 3 mmHg. IAS/Shunts: No atrial level shunt detected by color flow Doppler.  LEFT VENTRICLE PLAX 2D LVIDd:         4.50 cm      Diastology LVIDs:         4.00 cm      LV e' medial:    8.55 cm/s LV PW:         1.20 cm      LV E/e' medial:  7.8 LV IVS:        0.90 cm      LV e' lateral:   8.86 cm/s LVOT diam:     2.10 cm      LV E/e' lateral: 7.5  LV SV:         67 LV SV Index:   38 LVOT Area:     3.46 cm  LV Volumes (MOD) LV vol d, MOD A2C: 93.4 ml LV vol d, MOD A4C: 149.0 ml LV vol s, MOD A2C: 52.3 ml LV vol s, MOD A4C: 84.5 ml LV SV MOD A2C:     41.1 ml LV SV MOD A4C:     149.0 ml LV SV MOD BP:      48.2 ml RIGHT VENTRICLE             IVC RV S prime:     15.50 cm/s  IVC diam: 2.00 cm TAPSE (M-mode): 1.8 cm LEFT ATRIUM             Index        RIGHT ATRIUM           Index LA diam:        2.90 cm 1.66 cm/m   RA Area:     13.90 cm LA Vol (A2C):   63.8 ml 36.57 ml/m  RA Volume:   34.50 ml  19.77 ml/m LA Vol (A4C):   28.5 ml 16.33 ml/m LA Biplane Vol: 43.1 ml 24.70 ml/m  AORTIC VALVE LVOT Vmax:   132.00 cm/s LVOT Vmean:  87.200 cm/s LVOT VTI:    0.193 m  AORTA Ao Root diam: 2.90 cm Ao Asc diam:  3.20 cm MITRAL VALVE MV Area (PHT): 3.06 cm    SHUNTS MV Area VTI:   2.88 cm    Systemic VTI:  0.19 m MV Peak grad:  4.0 mmHg    Systemic Diam: 2.10 cm MV Mean grad:  2.0 mmHg MV Vmax:       1.00 m/s MV Vmean:      64.2 cm/s MV Decel Time: 248 msec MV E velocity: 66.70 cm/s MV A velocity: 68.90 cm/s MV E/A ratio:  0.97 Dalton McleanMD Electronically signed by Wilfred Lacy Signature Date/Time: 02/25/2022/3:04:29 PM    Final    VAS [place] LOWER EXTREMITY VENOUS (DVT)  Result Date: 02/25/2022  Lower Venous  DVT Study Patient Name:  Si GaulUL R Sandstrom  Date of Exam:   02/25/2022 Medical Rec #: [id number]       Accession #:    [id number] Date of Birth: [birth year]      Patient Gender: M Patient Age:   [age] years [facility] Procedure:      VAS US LOWER EXTREMITY VENOUS (DVT) Referring Phys: Scheryl MartenJINDONG XU --------------------------------------------------------------------------------  Indications: Stroke.  Comparison Study: prior 03/01/19 Performing Technologist: [place] PonderMegan Stricklin RVS  Examination Guidelines: A complete evaluation includes B-mode imaging, spectral Doppler, color Doppler, and power Doppler as needed of all accessible portions of each  vessel. Bilateral testing is considered an integral part of a complete examination. Limited examinations for reoccurring indications may be performed as noted. The reflux portion of the exam is performed with the patient in reverse Trendelenburg.  +--------+---------------+---------+-----------+----------+--------------------+ RIGHT   CompressibilityPhasicitySpontaneityPropertiesThrombus Aging       +--------+---------------+---------+-----------+----------+--------------------+ CFV     Full           Yes      Yes                                       +--------+---------------+---------+-----------+----------+--------------------+ SFJ     Full                                                              +--------+---------------+---------+-----------+----------+--------------------+ FV Prox Full                                                              +--------+---------------+---------+-----------+----------+--------------------+ FV Mid  Full           Yes      Yes                                       +--------+---------------+---------+-----------+----------+--------------------+ FV      Full                                                              Distal                                                                    +--------+---------------+---------+-----------+----------+--------------------+ PFV     Full                                                              +--------+---------------+---------+-----------+----------+--------------------+  POP     Full           Yes      Yes                                       +--------+---------------+---------+-----------+----------+--------------------+ PTV     Full           Yes      Yes                  patent by color                                                           doppler              +--------+---------------+---------+-----------+----------+--------------------+  PERO    Full           Yes      Yes                  patent by color                                                           doppler              +--------+---------------+---------+-----------+----------+--------------------+   +---------+---------------+---------+-----------+----------+--------------+ LEFT     CompressibilityPhasicitySpontaneityPropertiesThrombus Aging +---------+---------------+---------+-----------+----------+--------------+ CFV      Full           Yes      Yes                                 +---------+---------------+---------+-----------+----------+--------------+ SFJ      Full                                                        +---------+---------------+---------+-----------+----------+--------------+ FV Prox  Full                                                        +---------+---------------+---------+-----------+----------+--------------+ FV Mid   Full                                                        +---------+---------------+---------+-----------+----------+--------------+ FV DistalFull                                                        +---------+---------------+---------+-----------+----------+--------------+  PFV      Full                                                        +---------+---------------+---------+-----------+----------+--------------+ POP      Full           Yes      Yes                                 +---------+---------------+---------+-----------+----------+--------------+ PTV      Full                                                        +---------+---------------+---------+-----------+----------+--------------+ PERO     Full                                                        +---------+---------------+---------+-----------+----------+--------------+     Summary: BILATERAL: - No evidence of deep vein thrombosis seen in the lower extremities, bilaterally.  -No evidence of popliteal cyst, bilaterally.   *See table(s) above for measurements and observations. Electronically signed by Sherald Hess MD on 02/25/2022 at 12:07:47 PM.    Final    MR ANGIO HEAD WO CONTRAST  Result Date: 02/25/2022 CLINICAL DATA:  Stroke, follow up EXAM: MRA HEAD WITHOUT CONTRAST TECHNIQUE: Angiographic images of the Circle of Willis were acquired using MRA technique without intravenous contrast. COMPARISON:  MRI head February 24, 2022. FINDINGS: Anterior circulation: Bilateral intracranial ICAs, MCAs, and ACAs are patent without proximal hemodynamically significant stenosis. Posterior circulation: Bilateral intradural vertebral arteries, basilar artery and bilateral posterior cerebral arteries are patent without proximal hemodynamically significant stenosis. Left fetal type PCA, anatomic variant. IMPRESSION: No emergent large vessel occlusion or proximal hemodynamically significant stenosis. Electronically Signed   By: Feliberto Harts M.D.   On: 02/25/2022 09:01    Labs: BMET Recent Labs  Lab 02/23/22 2008 02/23/22 2016 02/23/22 2301 02/24/22 0205 02/24/22 0252 02/24/22 6387 02/24/22 1231 02/24/22 1947 02/24/22 1950 02/25/22 0332 02/26/22 0349  NA 137   < > 139 135 137 138 138 136 134* 133* 135  K 7.1*   < > 5.2* 5.7* 5.7* 6.1* 5.7* 4.3 4.2 3.6 3.7  CL 98   < > 106 105  --  107 105 99  --  95* 88*  CO2 22  --   --  17*  --  18* 19* 20*  --  23 31  GLUCOSE 145*   < > 90 111*  --  126* 95 156*  --  162* 131*  BUN 56*   < > 54* 63*  --  62* 71* 75*  --  74* 79*  CREATININE 3.33*   < > 3.40* 3.44*  --  3.53* 4.12* 4.77*  --  5.41* 7.07*  CALCIUM 7.0*  --   --  6.2*  --  6.5* 6.8* 6.7*  --  6.4* 6.1*  PHOS  --   --   --   --   --   --  6.6* 6.0*  --   --   --    < > = values in this interval not displayed.   CBC Recent Labs  Lab 02/23/22 2008 02/23/22 2016 02/24/22 0205 02/24/22 0252 02/24/22 0338 02/24/22 1950 02/25/22 0332 02/26/22 0349  WBC 16.4*   --  15.6*  --  16.0*  --  9.8 7.5  NEUTROABS 14.3*  --   --   --   --   --   --   --   HGB 17.3*   < > 15.7   < > 17.2* 13.9 13.4 12.1*  HCT 53.1*   < > 47.8   < > 50.2 41.0 38.1* 34.5*  MCV 97.8  --  98.6  --  96.5  --  92.3 92.5  PLT 349  --  266  --  283  --  198 178   < > = values in this interval not displayed.    Medications:     amLODipine  10 mg Oral Daily   aspirin EC  325 mg Oral Daily   cloNIDine  0.1 mg Oral BID   heparin  5,000 Units Subcutaneous Q8H   metoprolol tartrate  50 mg Oral BID   Bufford Buttner, MD 02/26/2022, 12:29 PM

## 2022-02-26 NOTE — PMR Pre-admission (Signed)
PMR Admission Coordinator Pre-Admission Assessment  Patient: Franklin Anderson is an 51 y.o., male MRN: [id number] DOB: Nov 24, 1970 Height: 6' (182.9 cm) Weight: 77.3 kg  Insurance Information HMO:     PPO:      PCP:      IPA:      80/20:      OTHER:  PRIMARY: UNINSURED      Policy#:       Subscriber:  CM Name:       Phone#:      Fax#:  Pre-Cert#:       Employer:  Benefits:  Phone #:      Name:  Eff. Date:      Deduct:       Out of Pocket Max:       Life Max:  CIR:       SNF:  Outpatient:      Co-Pay:  Home Health:       Co-Pay:  DME:      Co-Pay:  Providers:  SECONDARY:       Policy#:      Phone#:   Development worker, community:       Phone#:   The Engineer, petroleum" for patients in Inpatient Rehabilitation Facilities with attached "Privacy Act [place] Records" was provided and verbally reviewed with: N/A  Emergency Contact Information Contact Information     Name Relation Home Work Mobile   [place] Diane Spouse   [phone]       Current Medical History  Patient Admitting Diagnosis: CVA*** History of Present Illness: Pt is a 51 year old male with medical hx significant for: HTN, polysubstance abuse,rheumatic fever, endocarditis. Pt presented to [facility] on 02/23/22 d/t sudden onset of right-sided weakness.  Pt had AMS and was found down on the floor covered in fecal matter at home. Pt reported not feeling well x2 days.  EMS activated Code Stroke. Pt not a candidate for TNK. CT head negative for acute changes. Labs significant for hyperkalemia. Chest x-ray negative. Pelvic x-ray negative. Pt also noted to be developing non-traumatic rhabdomyolysis. CT hip found severe muscle inflammation of hip/gluteal muscles with concern for early necrotizing fascitis and compartment syndrome. Orthopedics consulted and recommended no surgical intervention at this time. 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 No  hemorrhage or mass effect. MRA negative for LVO. Nephrology consulted d/t creatinine up to 3.5. Echo showed EF 55%. Creatinine continued to trend up-7.07 on 11/2. Pt diuresed on 11/3. *** Therapy evaluations completed and CIR recommended d/t pt's deficits in functional mobility and inability to complete ADLs independently.  Complete NIHSS TOTAL: 3  Patient's medical record from [facility] has been reviewed by the rehabilitation admission coordinator and physician.  Past Medical History  Past Medical History:  Diagnosis Date   Drug abuse ([place])    H/O: rheumatic fever    Diagnosed at 51 y/o.  Resolved at 51 y/o.  States he was diagnosed with heart murmur felt due to this   Hypertension 2012    Has the patient had major surgery during 100 days prior to admission? No  Family History   family history includes Aneurysm (age of onset: 76) in his brother; COPD in his mother; Cancer in his father; Coronary artery disease in his father; Hypertension in his brother and mother; Lymphoma (age of onset: 31) in his mother.  Current Medications  Current Facility-Administered Medications:    0.9 %  sodium chloride infusion, ,  Intravenous, Continuous, Madelon Lips, MD   amLODipine (NORVASC) tablet 10 mg, 10 mg, Oral, Daily, Elgergawy, Silver Huguenin, MD, 10 mg at 02/26/22 2536   aspirin EC tablet 325 mg, 325 mg, Oral, Daily, Rosalin Hawking, MD, 325 mg at 02/26/22 6440   calcium gluconate 1 g/ 50 mL sodium chloride IVPB, 1 g, Intravenous, Once, Madelon Lips, MD   cloNIDine (CATAPRES) tablet 0.1 mg, 0.1 mg, Oral, BID, Elgergawy, Silver Huguenin, MD, 0.1 mg at 02/26/22 0837   heparin injection 5,000 Units, 5,000 Units, Subcutaneous, Q8H, Myles Rosenthal A, MD, 5,000 Units at 02/26/22 0502   hydrALAZINE (APRESOLINE) injection 10 mg, 10 mg, Intravenous, Q4H PRN, Elgergawy, Silver Huguenin, MD   HYDROmorphone (DILAUDID) injection 1 mg, 1 mg, Intravenous, Q4H PRN, Shela Leff, MD, 1 mg at 02/26/22 1129    labetalol (NORMODYNE) injection 5 mg, 5 mg, Intravenous, Q2H PRN, Shela Leff, MD, 5 mg at 02/24/22 2046   linezolid (ZYVOX) IVPB 600 mg, 600 mg, Intravenous, Q12H, Samtani, Jai-Gurmukh, MD, Last Rate: 300 mL/hr at 02/26/22 0944, 600 mg at 02/26/22 0944   metoprolol tartrate (LOPRESSOR) tablet 50 mg, 50 mg, Oral, BID, Samtani, Jai-Gurmukh, MD, 50 mg at 02/26/22 0837   naloxone (NARCAN) injection 0.4 mg, 0.4 mg, Intravenous, PRN, Shela Leff, MD   ondansetron (ZOFRAN) tablet 4 mg, 4 mg, Oral, Q6H PRN **OR** ondansetron (ZOFRAN) injection 4 mg, 4 mg, Intravenous, Q6H PRN, Myles Rosenthal A, MD, 4 mg at 02/24/22 0414   oxyCODONE (Oxy IR/ROXICODONE) immediate release tablet 5 mg, 5 mg, Oral, Q4H PRN, Verlon Au, Jai-Gurmukh, MD, 5 mg at 02/26/22 0944   piperacillin-tazobactam (ZOSYN) IVPB 2.25 g, 2.25 g, Intravenous, Q8H, Rumbarger, Valeda Malm, RPH, Last Rate: 100 mL/hr at 02/26/22 0503, 2.25 g at 02/26/22 0503  Patients Current Diet:  Diet Order             DIET SOFT Room service appropriate? Yes; Fluid consistency: Thin  Diet effective now                   Precautions / Restrictions Precautions Precautions: Fall Precaution Comments: scrotal edema Other Brace: OT made scrotal sling Restrictions Weight Bearing Restrictions: No   Has the patient had 2 or more falls or a fall with injury in the past year? No  Prior Activity Level Community (5-7x/wk): drives, works, gets out of house daily  Prior Functional Level Self Care: Did the patient need help bathing, dressing, using the toilet or eating? Independent  Indoor Mobility: Did the patient need assistance with walking from room to room (with or without device)? Independent  Stairs: Did the patient need assistance with internal or external stairs (with or without device)? Independent  Functional Cognition: Did the patient need help planning regular tasks such as shopping or remembering to take medications?  Independent  Patient Information Are you of Hispanic, Latino/a,or Spanish origin?: A. No, not of Hispanic, Latino/a, or Spanish origin What is your race?: A. White Do you need or want an interpreter to communicate with a doctor or health care staff?: 0. No  Patient's Response To:  Health Literacy and Transportation Is the patient able to respond to health literacy and transportation needs?: Yes Health Literacy - How often do you need to have someone help you when you read instructions, pamphlets, or other written material from your doctor or pharmacy?: Never In the past 12 months, has lack of transportation kept you from medical appointments or from getting medications?: No In the past 12 months, has lack of  transportation kept you from meetings, work, or from getting things needed for daily living?: No  Development worker, international aid / [place] Devices/Equipment: None Home Equipment: Grab bars - tub/shower  Prior Device Use: Indicate devices/aids used by the patient prior to current illness, exacerbation or injury? None of the above  Current Functional Level Cognition  Overall Cognitive Status: Within Functional Limits for tasks assessed Orientation Level: Oriented X4 General Comments: Pt reporting his thinking feels normal. Following all commands, increased time but due to pain. Oriented to date, but reports it has been hard to keep up with day of the week.    Extremity Assessment (includes Sensation/Coordination)  Upper Extremity Assessment: Generalized weakness (4/5)  Lower Extremity Assessment: Defer to PT evaluation RLE Deficits / Details: edematous throughout and held in external rotation with leg elevated on pillows; AAROM limited hip flexion and knee flexion with pt able to activate gluts, quads and calf though tender/painful and tight heel cord    ADLs  Overall ADL's : Needs assistance/impaired Eating/Feeding: Modified independent, Bed level Grooming: Min guard,  Sitting Upper Body Bathing: Sitting, Minimal assistance Lower Body Bathing: Maximal assistance, +2 for physical assistance, +2 for safety/equipment, Sit to/from stand Upper Body Dressing : Minimal assistance, Sitting Lower Body Dressing: Maximal assistance, +2 for physical assistance, +2 for safety/equipment, Sit to/from stand Toilet Transfer: Moderate assistance, +2 for physical assistance, +2 for safety/equipment, BSC/3in1, Rolling walker (2 wheels) Toilet Transfer Details (indicate cue type and reason): Taking side steps with shuffle step toward HOB. Pain limited Functional mobility during ADLs: Moderate assistance, +2 for physical assistance, +2 for safety/equipment, Rolling walker (2 wheels) General ADL Comments: Mod A and increased time for steps towards TOB. Pt usually independent, so cues for mobility to minimize pain. OT making scrotal sling for pt comfort and to minimize edema    Mobility  Overal bed mobility: Needs Assistance Bed Mobility: Supine to Sit Supine to sit: +2 for physical assistance, HOB elevated, Mod assist General bed mobility comments: assist to scoot hips, move R leg and pt needing HHA to lift trunk upright    Transfers  Overall transfer level: Needs assistance Equipment used: Rolling walker (2 wheels) Transfers: Sit to/from Stand Sit to Stand: From elevated surface, +2 physical assistance, Mod assist General transfer comment: some lifting help to stand at bedside with RW and limited R weight bearing with pain    Ambulation / Gait / Stairs / Wheelchair Mobility  Ambulation/Gait Ambulation/Gait assistance: Mod assist Gait Distance (Feet): 1 Feet Assistive device: Rolling walker (2 wheels) General Gait Details: "shimmy" steps with L toward HOB x 2    Posture / Balance Balance Overall balance assessment: Needs assistance Sitting-balance support: Feet supported Sitting balance-Leahy Scale: Fair Standing balance support: Reliant on assistive device for balance,  Bilateral upper extremity supported Standing balance-Leahy Scale: Poor Standing balance comment: UE support due to pain    Special needs/care consideration Continuous Drip IV  *** and Skin ***   Previous Home Environment (from acute therapy documentation) Living Arrangements: Spouse/significant other  Lives With: Spouse Available Help at Discharge: Family, Available 24 hours/day Type of Home: Mobile home Home Layout: One level Home Access: Stairs to enter Entrance Stairs-Rails: Can reach both Entrance Stairs-Number of Steps: 3 Bathroom Shower/Tub: Chiropodist: Standard Bathroom Accessibility: Yes How Accessible: Accessible via walker [place]: No  Discharge Living Setting Plans for Discharge Living Setting: Patient's home Type of Home at Discharge: Mobile home Discharge Home Layout: One level Discharge Home Access:  Stairs to enter Entrance Stairs-Rails: Can reach both Entrance Stairs-Number of Steps: 3 Discharge Bathroom Shower/Tub: Tub/shower unit Discharge Bathroom Toilet: Standard Discharge Bathroom Accessibility: Yes How Accessible: Accessible via walker Does the patient have any problems obtaining your medications?: Yes (Describe) (cost)  Social/Family/Support Systems Anticipated Caregiver: Dartanyon Frankowski, wife Anticipated Caregiver's Contact Information: [phone] Caregiver Availability: 24/7 Discharge Plan Discussed with Primary Caregiver: Yes Is Caregiver In Agreement with Plan?: Yes Does Caregiver/Family have Issues with Lodging/Transportation while Pt is in Rehab?: No  Goals Patient/Family Goal for Rehab: *** Expected length of stay: *** Pt/Family Agrees to Admission and willing to participate: Yes Program Orientation Provided & Reviewed with Pt/Caregiver Including Roles  & Responsibilities: Yes  Decrease burden of Care through IP rehab admission: NA  Possible need for SNF placement upon discharge: Not anticipated  Patient  Condition: I have reviewed medical records from [facility], spoken with {CHL IP CSW OI:[id number]}, and patient and spouse. I met with patient at the bedside and discussed via phone for inpatient rehabilitation assessment.  Patient will benefit from ongoing {CHL IP PT OT BRA:[id number]}, can actively participate in 3 hours of therapy a day 5 days of the week, and can make measurable gains during the admission.  Patient will also benefit from the coordinated team approach during an Inpatient Acute Rehabilitation admission.  The patient will receive intensive therapy as well as Rehabilitation physician, nursing, social worker, and care management interventions.  Due to bladder management, safety, skin/wound care, disease management, medication administration, pain management, and patient education the patient requires 24 hour a day rehabilitation nursing.  The patient is currently *** with mobility and basic ADLs.  Discharge setting and therapy post discharge at home with home health is anticipated.  Patient has agreed to participate in the Acute Inpatient Rehabilitation Program and will admit {Time; today/tomorrow:10263}.  Preadmission Screen Completed By:  Bethel Born, 02/26/2022 2:00 PM ______________________________________________________________________   Discussed status with Dr. Marland Kitchen on *** at *** and received approval for admission today.  Admission Coordinator:  Bethel Born, CCC-SLP, time ***/Date ***   Assessment/Plan: Diagnosis: Does the need for close, 24 hr/day Medical supervision in concert with the patient's rehab needs make it unreasonable for this patient to be served in a less intensive setting? {yes_no_potentially:3041433} Co-Morbidities requiring supervision/potential complications: *** Due to {due SU:1103159}, does the patient require 24 hr/day rehab nursing? {yes_no_potentially:3041433} Does the patient require coordinated care of a physician, rehab nurse,  PT, OT, and SLP to address physical and functional deficits in the context of the above medical diagnosis(es)? {yes_no_potentially:3041433} Addressing deficits in the following areas: {deficits:3041436} Can the patient actively participate in an intensive therapy program of at least 3 hrs of therapy 5 days a week? {yes_no_potentially:3041433} The potential for patient to make measurable gains while on inpatient rehab is {potential:3041437} Anticipated functional outcomes upon discharge from inpatient rehab: {functional outcomes:304600100} PT, {functional outcomes:304600100} OT, {functional outcomes:304600100} SLP Estimated rehab length of stay to reach the above functional goals is: *** Anticipated discharge destination: {anticipated dc setting:21604} 10. Overall Rehab/Functional Prognosis: {potential:3041437}   MD Signature: ***

## 2022-02-26 NOTE — Progress Notes (Addendum)
STROKE TEAM PROGRESS NOTE   INTERVAL HISTORY No family at the bedside. Patient seems more alert and watching TV.He is alert and oriented. Moves bilateral uppers and left leg. Still c/o not able to move right leg or wiggle toes due to pain. Pain radiated to his hip. He did try to work with PT. Discussed to quit use of illicit drugs. Patient stated that he is ready  Recommend TEE when patient is stable enough  Vitals:   02/26/22 0307 02/26/22 0500 02/26/22 0802 02/26/22 1207  BP: (!) 154/104  (!) 161/95 (!) 147/97  Pulse: 78  77 76  Resp: 18  18 18   Temp: 98 F (36.7 C)  98 F (36.7 C) 98.3 F (36.8 C)  TempSrc: Oral  Oral Oral  SpO2: 93%  91% 91%  Weight:  77.3 kg    Height:       CBC:  Recent Labs  Lab 02/23/22 2008 02/23/22 2016 02/25/22 0332 02/26/22 0349  WBC 16.4*   < > 9.8 7.5  NEUTROABS 14.3*  --   --   --   HGB 17.3*   < > 13.4 12.1*  HCT 53.1*   < > 38.1* 34.5*  MCV 97.8   < > 92.3 92.5  PLT 349   < > 198 178   < > = values in this interval not displayed.   Basic Metabolic Panel:  Recent Labs  Lab 02/24/22 1231 02/24/22 1947 02/24/22 1950 02/25/22 0332 02/26/22 0349  NA 138 136   < > 133* 135  K 5.7* 4.3   < > 3.6 3.7  CL 105 99  --  95* 88*  CO2 19* 20*  --  23 31  GLUCOSE 95 156*  --  162* 131*  BUN 71* 75*  --  74* 79*  CREATININE 4.12* 4.77*  --  5.41* 7.07*  CALCIUM 6.8* 6.7*  --  6.4* 6.1*  PHOS 6.6* 6.0*  --   --   --    < > = values in this interval not displayed.   Lipid Panel:  Recent Labs  Lab 02/24/22 0454  CHOL 102  TRIG 49  HDL 34*  CHOLHDL 3.0  VLDL 10  LDLCALC 58   HgbA1c:  Recent Labs  Lab 02/24/22 0205  HGBA1C 5.7*   Urine Drug Screen:  Recent Labs  Lab 02/23/22 2323  LABOPIA NONE DETECTED  COCAINSCRNUR POSITIVE*  LABBENZ POSITIVE*  AMPHETMU POSITIVE*  THCU NONE DETECTED  LABBARB NONE DETECTED    Alcohol Level  Recent Labs  Lab 02/23/22 2008  ETH <10    IMAGING past 24 hours ECHOCARDIOGRAM  COMPLETE  Result Date: 02/25/2022    ECHOCARDIOGRAM REPORT   Patient Name:   Franklin Anderson Date of Exam: 02/25/2022 Medical Rec #:  [id number]      Height:       72.0 in Accession #:    [id number]     Weight:       125.0 lb Date of Birth:  [birth year]     BSA:          1.745 m Patient Age:    50 years       BP:           174/116 mmHg Patient Gender: M              HR:           83 bpm. Exam Location:  Inpatient Procedure: 2D Echo, Color Doppler and Cardiac Doppler  Indications:    Stroke  History:        Patient has no prior history of Echocardiogram examinations.                 Signs/Symptoms:Murmur; Risk Factors:Hypertension, Family History                 of Coronary Artery Disease and Current Smoker. Polysubstance                 abuse, rheumatic fever (as a child).  Sonographer:    Milda Smart Referring Phys: [id number] ZWCHENI Brookes Craine  Sonographer Comments: Image acquisition challenging due to patient body habitus and Image acquisition challenging due to respiratory motion. IMPRESSIONS  1. Left ventricular ejection fraction, by estimation, is 55%. The left ventricle has normal function. The left ventricle has no regional wall motion abnormalities. There is mild concentric left ventricular hypertrophy. Left ventricular diastolic parameters are consistent with Grade I diastolic dysfunction (impaired relaxation).  2. Right ventricular systolic function is normal. The right ventricular size is normal. Tricuspid regurgitation signal is inadequate for assessing PA pressure.  3. The mitral valve is normal in structure. Trivial mitral valve regurgitation. No evidence of mitral stenosis.  4. The aortic valve is tricuspid. Aortic valve regurgitation is trivial. No aortic stenosis is present.  5. The inferior vena cava is normal in size with greater than 50% respiratory variability, suggesting right atrial pressure of 3 mmHg. FINDINGS  Left Ventricle: Left ventricular ejection fraction, by estimation, is 55%. The left  ventricle has normal function. The left ventricle has no regional wall motion abnormalities. The left ventricular internal cavity size was normal in size. There is mild concentric left ventricular hypertrophy. Left ventricular diastolic parameters are consistent with Grade I diastolic dysfunction (impaired relaxation). Right Ventricle: The right ventricular size is normal. No increase in right ventricular wall thickness. Right ventricular systolic function is normal. Tricuspid regurgitation signal is inadequate for assessing PA pressure. Left Atrium: Left atrial size was normal in size. Right Atrium: Right atrial size was normal in size. Pericardium: Trivial pericardial effusion is present. Mitral Valve: The mitral valve is normal in structure. Trivial mitral valve regurgitation. No evidence of mitral valve stenosis. MV peak gradient, 4.0 mmHg. The mean mitral valve gradient is 2.0 mmHg. Tricuspid Valve: The tricuspid valve is normal in structure. Tricuspid valve regurgitation is not demonstrated. Aortic Valve: The aortic valve is tricuspid. Aortic valve regurgitation is trivial. No aortic stenosis is present. Pulmonic Valve: The pulmonic valve was normal in structure. Pulmonic valve regurgitation is not visualized. Aorta: The aortic root is normal in size and structure. Venous: The inferior vena cava is normal in size with greater than 50% respiratory variability, suggesting right atrial pressure of 3 mmHg. IAS/Shunts: No atrial level shunt detected by color flow Doppler.  LEFT VENTRICLE PLAX 2D LVIDd:         4.50 cm      Diastology LVIDs:         4.00 cm      LV e' medial:    8.55 cm/s LV PW:         1.20 cm      LV E/e' medial:  7.8 LV IVS:        0.90 cm      LV e' lateral:   8.86 cm/s LVOT diam:     2.10 cm      LV E/e' lateral: 7.5 LV SV:         67 LV  SV Index:   38 LVOT Area:     3.46 cm  LV Volumes (MOD) LV vol d, MOD A2C: 93.4 ml LV vol d, MOD A4C: 149.0 ml LV vol s, MOD A2C: 52.3 ml LV vol s, MOD A4C:  84.5 ml LV SV MOD A2C:     41.1 ml LV SV MOD A4C:     149.0 ml LV SV MOD BP:      48.2 ml RIGHT VENTRICLE             IVC RV S prime:     15.50 cm/s  IVC diam: 2.00 cm TAPSE (M-mode): 1.8 cm LEFT ATRIUM             Index        RIGHT ATRIUM           Index LA diam:        2.90 cm 1.66 cm/m   RA Area:     13.90 cm LA Vol (A2C):   63.8 ml 36.57 ml/m  RA Volume:   34.50 ml  19.77 ml/m LA Vol (A4C):   28.5 ml 16.33 ml/m LA Biplane Vol: 43.1 ml 24.70 ml/m  AORTIC VALVE LVOT Vmax:   132.00 cm/s LVOT Vmean:  87.200 cm/s LVOT VTI:    0.193 m  AORTA Ao Root diam: 2.90 cm Ao Asc diam:  3.20 cm MITRAL VALVE MV Area (PHT): 3.06 cm    SHUNTS MV Area VTI:   2.88 cm    Systemic VTI:  0.19 m MV Peak grad:  4.0 mmHg    Systemic Diam: 2.10 cm MV Mean grad:  2.0 mmHg MV Vmax:       1.00 m/s MV Vmean:      64.2 cm/s MV Decel Time: 248 msec MV E velocity: 66.70 cm/s MV A velocity: 68.90 cm/s MV E/A ratio:  0.97 Dalton McleanMD Electronically signed by Wilfred Lacy Signature Date/Time: 02/25/2022/3:04:29 PM    Final     PHYSICAL EXAM  Temp:  [97.6 F (36.4 C)-98.3 F (36.8 C)] 98.3 F (36.8 C) (11/02 1207) Pulse Rate:  [76-84] 76 (11/02 1207) Resp:  [16-18] 18 (11/02 1207) BP: (147-180)/(95-120) 147/97 (11/02 1207) SpO2:  [91 %-95 %] 91 % (11/02 1207) Weight:  [77.3 kg] 77.3 kg (11/02 0500)  General - Well nourished, well developed, in no apparent distress. Cardiovascular - Regular rhythm and rate.  Mental Status -  Level of arousal and orientation to time, place, and person is intact.He was able to recite place ([place]), month/November and year (2023) He is aware of  reasoning his hospitalization  Language including expression, naming, repetition, comprehension was assessed and found intact. Attention span and concentration were normal. Recent and remote memory were intact. Fund of Knowledge was assessed and was intact.  Cranial Nerves II - XII - II - Visual field intact OU. III, IV, VI -  Extraocular movements intact. V - Facial sensation intact bilaterally. VII - Facial movement intact bilaterally. VIII - Hearing & vestibular intact bilaterally. X - Palate elevates symmetrically. XI - Chin turning & shoulder shrug intact bilaterally. XII - Tongue protrusion intact.  Motor Strength - The patient's strength was normal in bilateral uppers and left lower and pronator drift was absent.  Right lower unable to lift leg due to swelling and pain, but able to wiggle toes slowly. Bulk was normal and fasciculations were absent.   Motor Tone - Muscle tone was assessed at the neck and appendages and was normal.  Sensory -  Light touch, temperature were assessed and were symmetrical.    Coordination - The patient had normal movements in the hands and feet with no ataxia or dysmetria.  Tremor was absent.  Gait and Station - deferred.   ASSESSMENT/PLAN Franklin Anderson is a 51 y.o. male with history of polysubstance abuse, essential hypertension , rheumatic fever with subsequent heart murmur who presents to ED VIA EMS after being found down at home in his own feces,after falling out of bed last pm. 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 Severe rhabdomyolysis   Code Stroke CT head No acute abnormality. 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 MRI acute infarcts in the left cerebellum and in the corona radiata on the right.   MRA head No emergent large vessel occlusion or proximal hemodynamically significant stenosis. Carotid Doppler unremarkable 2D Echo EF 55% Recommend TEE when stable to rule out endocarditis [place] LE bilateral negative for DVT LDL 58 HgbA1c 5.7 UDS positive for cocaine, amphetamine, benzo VTE prophylaxis - Heparin No antithrombotic prior to admission, now on  aspirin 325 mg daily. No DAPT in case needing surgical intervnention Therapy recommendations:  pending Disposition:  pending  Severe rhabdomyolysis AKI CK significantly elevated, trending down today @ 25,880 AST and ALT also elevated and trending down Cre 3.33->4.12->5.41 > 7.07 BMP monitoring  Right leg swelling Could be related to severe rhabdo Ortho on board On zyvox and zosyn ? Compartment syndrome  Hypertension Home meds:  losartan metoprolol Significantly elevated Now on norvasc, clonidine, metorolol Long-term BP goal normotensive  Hyperlipidemia Home meds:  none LDL 58, goal < 70 High intensity statin not indicated below goal   Tobacco abuse Current smoker Smoking cessation counseling provided Nicotine patch provided Pt is willing to quit  Cocaine abuse Cessation education provided.  Other Stroke Risk Factors Hx stroke/TIA by imaging    Hospital day # 2   Rosalin Hawking, MD PhD Stroke Neurology 02/26/2022 7:42 PM   To contact Stroke Continuity provider, please refer to [email or web address] After hours, contact General Neurology
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
## 2022-02-27 DIAGNOSIS — F1410 Cocaine abuse, uncomplicated: Secondary | ICD-10-CM

## 2022-02-27 LAB — COMPREHENSIVE METABOLIC PANEL
ALT: 351 U/L — ABNORMAL HIGH (ref 0–44)
AST: 518 U/L — ABNORMAL HIGH (ref 15–41)
Albumin: 1.9 g/dL — ABNORMAL LOW (ref 3.5–5.0)
Alkaline Phosphatase: 51 U/L (ref 38–126)
Anion gap: 15 (ref 5–15)
BUN: 85 mg/dL — ABNORMAL HIGH (ref 6–20)
CO2: 31 mmol/L (ref 22–32)
Calcium: 6.2 mg/dL — CL (ref 8.9–10.3)
Chloride: 90 mmol/L — ABNORMAL LOW (ref 98–111)
Creatinine, Ser: 8.3 mg/dL — ABNORMAL HIGH (ref 0.61–1.24)
GFR, Estimated: 7 mL/min — ABNORMAL LOW (ref >60)
Glucose, Bld: 106 mg/dL — ABNORMAL HIGH (ref 70–99)
Potassium: 3.6 mmol/L (ref 3.5–5.1)
Sodium: 136 mmol/L (ref 135–145)
Total Bilirubin: 0.8 mg/dL (ref 0.3–1.2)
Total Protein: 4.6 g/dL — ABNORMAL LOW (ref 6.5–8.1)

## 2022-02-27 LAB — CBC
HCT: 34.1 % — ABNORMAL LOW (ref 39.0–52.0)
Hemoglobin: 12.1 g/dL — ABNORMAL LOW (ref 13.0–17.0)
MCH: 32.6 pg (ref 26.0–34.0)
MCHC: 35.5 g/dL (ref 30.0–36.0)
MCV: 91.9 fL (ref 80.0–100.0)
Platelets: 182 10*3/uL (ref 150–400)
RBC: 3.71 MIL/uL — ABNORMAL LOW (ref 4.22–5.81)
RDW: 12.2 % (ref 11.5–15.5)
WBC: 6.7 10*3/uL (ref 4.0–10.5)
nRBC: 0 % (ref 0.0–0.2)

## 2022-02-27 LAB — CK: Total CK: 23566 U/L — ABNORMAL HIGH (ref 49–397)

## 2022-02-27 MED ORDER — CALCIUM GLUCONATE-NACL 1-0.675 GM/50ML-% IV SOLN
1.0000 g | Freq: Once | INTRAVENOUS | Status: AC
  Administered 2022-02-27: 1000 mg via INTRAVENOUS
  Filled 2022-02-27: qty 50

## 2022-02-27 MED ORDER — FUROSEMIDE 10 MG/ML IJ SOLN
80.0000 mg | Freq: Once | INTRAMUSCULAR | Status: AC
  Administered 2022-02-27: 80 mg via INTRAVENOUS
  Filled 2022-02-27: qty 8

## 2022-02-27 MED ORDER — CALCIUM CARBONATE ANTACID 500 MG PO CHEW
2.0000 | CHEWABLE_TABLET | Freq: Three times a day (TID) | ORAL | Status: DC
  Administered 2022-02-27 – 2022-03-01 (×9): 400 mg via ORAL
  Filled 2022-02-27 (×9): qty 2

## 2022-02-27 MED ORDER — CHLORHEXIDINE GLUCONATE CLOTH 2 % EX PADS
6.0000 | MEDICATED_PAD | Freq: Every day | CUTANEOUS | Status: DC
  Administered 2022-02-27 – 2022-03-05 (×7): 6 via TOPICAL

## 2022-02-27 NOTE — Progress Notes (Signed)
PROGRESS NOTE    Franklin Anderson  ZOX:[id number] DOB: 10-13-1970 DOA: 02/23/2022 PCP: Cain Saupe, MD   Chief Complaint  Patient presents with   Code Stroke    Brief Narrative:    Franklin Anderson is a 51 y.o. male with medical history significant of polysubstance abuse, essential hypertension , rheumatic fever with subsequent heart murmur who presents to ED BIB EMS after being found down at home in his own feces,after falling out of bed last pm.  Arrival to ED patient was noted to have significant right-sided swelling, pain, CT of right hip was significant for severe muscle inflammation of the hip/gluteal muscle edema, as well he was noted to have acute renal failure, and work-up significant for CVA.  Assessment & Plan:   Principal Problem:   Change in mental status Active Problems:   Uncontrolled hypertension   Polysubstance abuse (HCC)  AKI Severe rhabdomyolysis -Renal input greatly appreciated, total CK improving on IV fluids -Unfortunately creatinine continues to trend up, it is 8.3today, but potassium has been stable, will await further recommendation from renal regarding need for RRT. -BUN is trending up, will continue to monitor closely -Continue with Foley catheter -Bicarb is 31 today, so bicarb drip has been discontinued. -Further management per renal   Acute Metabolic  Encephalopathy  -due to polysubstance abuse and acute CVA . -Mental status significantly improved .  Hyperkalemia -Improved, hold Lokelma  Hypocalcemia -Replete as needed  Hypertension -Blood pressure significantly elevated, (patient is out of permissive HTN period per neuro note) -Currently improved on clonidine and amlodipine, continue with as needed hydralazine.    Acute CVA -He denies any IV drug abuse, -Neurology on board  Polysubstance abuse - CIWA protocol -Patient denies any IV drug abuse, reports he just snorted, he is adamant that no IV drug abuse since 2020.    Elevated  LFTS: -Most likely due to above, trending down. 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  Franklin Anderson Kitchen  Follow on HCVRNA.   Severe muscle inflammation of right hip and gluteal muscle/crush injury -Is most likely due to severe rhabdomyolysis/myositis/ crush injury-most likely in the setting of cocaine use, as well possibly related to laying on the floor for unknown period of time  -No orthopedic surgery recommended at this time per orthopedic .     DVT prophylaxis: [place] heaprin Code Status: Full Family Communication: discussed with his wife per his permission. Disposition:   Status is: Inpatient    Consultants:  Neurology Nephrology Orthopedic   Subjective:  reports right lower extremity pain has improved  Objective: Vitals:   02/27/22 0354 02/27/22 0734 02/27/22 1115 02/27/22 1515  BP:  (!) 170/99 (!) 160/110 (!) 179/100  Pulse: 80 80 80 74  Resp: 15 18 20 18   Temp: 98.4 F (36.9 C) 98.7 F (37.1 C) 98.5 F (36.9 C) 97.8 F (36.6 C)  TempSrc: Oral Oral Oral Oral  SpO2: 92% (!) 89% 96% 93%  Weight:      Height:        Intake/Output Summary (Last 24 hours) at 02/27/2022 1517 Last data filed at 02/27/2022 1500 Gross per 24 hour  Intake 5117.43 ml  Output 2650 ml  Net 2467.43 ml   Filed Weights   02/23/22 2046 02/26/22 0500  Weight: 56.7 kg 77.3 kg    Examination:   Awake Alert, Oriented X 3, frail, deconditioned, chronically ill-appearing Symmetrical Chest wall movement, Good air movement bilaterally, CTAB RRR,No Gallops,Rubs or  new Murmurs, No Parasternal Heave +ve B.Sounds, Abd Soft, No tenderness, No rebound - guarding or rigidity. Lower extremity with less swelling and tenderness today.  Is with worsening scrotal edema.    Data Reviewed: I have personally reviewed following labs and imaging studies  CBC: Recent Labs  Lab 02/23/22 2008 02/23/22 2016 02/24/22 0205 02/24/22 0252 02/24/22 0338 02/24/22 1950  02/25/22 0332 02/26/22 0349 02/27/22 0326  WBC 16.4*  --  15.6*  --  16.0*  --  9.8 7.5 6.7  NEUTROABS 14.3*  --   --   --   --   --   --   --   --   HGB 17.3*   < > 15.7   < > 17.2* 13.9 13.4 12.1* 12.1*  HCT 53.1*   < > 47.8   < > 50.2 41.0 38.1* 34.5* 34.1*  MCV 97.8  --  98.6  --  96.5  --  92.3 92.5 91.9  PLT 349  --  266  --  283  --  198 178 182   < > = values in this interval not displayed.    Basic Metabolic Panel: Recent Labs  Lab 02/24/22 1231 02/24/22 1947 02/24/22 1950 02/25/22 0332 02/26/22 0349 02/27/22 0326  NA 138 136 134* 133* 135 136  K 5.7* 4.3 4.2 3.6 3.7 3.6  CL 105 99  --  95* 88* 90*  CO2 19* 20*  --  23 31 31   GLUCOSE 95 156*  --  162* 131* 106*  BUN 71* 75*  --  74* 79* 85*  CREATININE 4.12* 4.77*  --  5.41* 7.07* 8.30*  CALCIUM 6.8* 6.7*  --  6.4* 6.1* 6.2*  PHOS 6.6* 6.0*  --   --   --   --     GFR: Estimated Creatinine Clearance: 11.5 mL/min (A) (by C-G formula based on SCr of 8.3 mg/dL (H)).  Liver Function Tests: Recent Labs  Lab 02/24/22 0205 02/24/22 0338 02/24/22 1231 02/24/22 1947 02/25/22 0332 02/26/22 0349 02/27/22 0326  AST 1,134* 1,173*  --   --  645* 546* 518*  ALT 596* 627*  --   --  440* 362* 351*  ALKPHOS 78 89  --   --  62 55 51  BILITOT 0.6 0.7  --   --  0.6 0.8 0.8  PROT 5.7* 6.2*  --   --  4.2* 4.1* 4.6*  ALBUMIN 3.0* 3.1* 2.7* 2.3* 2.1* 1.9* 1.9*    CBG: Recent Labs  Lab 02/23/22 2008 02/24/22 0747 02/26/22 0826  GLUCAP 142* 131* 142*     Recent Results (from the past 240 hour(s))  Urine Culture     Status: None   Collection Time: 02/23/22 11:23 PM   Specimen: Urine, Clean Catch  Result Value Ref Range Status   Specimen Description URINE, CLEAN CATCH  Final   Special Requests NONE  Final   Culture   Final    NO GROWTH Performed at [facility], [place] [address], [place], [place] [address]    Report Status 02/25/2022 FINAL  Final  Culture, blood (Routine X 2) w Reflex to ID Panel     Status:  None (Preliminary result)   Collection Time: 02/24/22  3:38 AM   Specimen: BLOOD LEFT HAND  Result Value Ref Range Status   Specimen Description BLOOD LEFT HAND  Final   Special Requests   Final    BOTTLES DRAWN AEROBIC ONLY Blood Culture results may not be optimal  due to an inadequate volume of blood received in culture bottles   Culture   Final    NO GROWTH 3 DAYS Performed at [facility], [address], [place], [place] [address]    Report Status PENDING  Incomplete  Culture, blood (Routine X 2) w Reflex to ID Panel     Status: None (Preliminary result)   Collection Time: 02/24/22  3:41 AM   Specimen: BLOOD  Result Value Ref Range Status   Specimen Description BLOOD LEFT ANTECUBITAL  Final   Special Requests   Final    BOTTLES DRAWN AEROBIC AND ANAEROBIC Blood Culture adequate volume   Culture   Final    NO GROWTH 3 DAYS Performed at [facility], [address] [place], [place] [address]    Report Status PENDING  Incomplete         Radiology Studies: No results found.      Scheduled Meds:  amLODipine  10 mg Oral Daily   aspirin EC  325 mg Oral Daily   calcium carbonate  2 tablet Oral TID   Chlorhexidine Gluconate Cloth  6 each Topical Daily   cloNIDine  0.1 mg Oral BID   heparin  5,000 Units Subcutaneous Q8H   metoprolol tartrate  50 mg Oral BID   Continuous Infusions:  sodium chloride 150 mL/hr at 02/27/22 1500     LOS: 3 days       Huey Bienenstock, MD [facility]   To contact the attending provider between 7A-7P or the covering provider during after hours 7P-7A, please log into the web site [email or web address] and access using universal [place] password for that web site. If you do not have the password, please call the hospital operator.  02/27/2022, 3:17 PM

## 2022-02-27 NOTE — Progress Notes (Signed)
Physical Therapy Treatment Patient Details Name: Franklin Anderson MRN: [id number] DOB: May 19, 1970 Today's Date: 02/27/2022   History of Present Illness Franklin Anderson is a 51 y.o. 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  Patient was noted to have significant right-sided swelling, pain, CT of right hip was significant for severe muscle inflammation of the hip/gluteal muscle edema, as well he was noted to have acute renal failure, and work-up significant for CVA.    PT Comments    Pt received supine and agreeable to session with continued progress towards acute goals, however pt continues to be limited by pain despite premedication. Pt able to come to sitting EOB with mod assist and increased time with step by step cues needed to bring BLE to and off EOB. Pt able to come to standing with mod assist to RW and step pivot to recliner with min assist to manage RW during SPT and for sequencing with noted minimal Wbing through RLE secondary to pain, with a more hop-to pattern on LLE. Current plan remains appropriate to address deficits and maximize functional independence and decrease caregiver burden. Pt continues to benefit from skilled PT services to progress toward functional mobility goals.    Recommendations for follow up therapy are one component of a multi-disciplinary discharge planning process, led by the attending physician.  Recommendations may be updated based on patient status, additional functional criteria and insurance authorization.  Follow Up Recommendations  Acute inpatient rehab (3hours/day)     Assistance Recommended at Discharge Intermittent Supervision/Assistance  Patient can return home with the following Two  people to help with walking and/or transfers;Two people to help with bathing/dressing/bathroom   Equipment Recommendations  BSC/3in1;Rolling walker (2 wheels);Wheelchair (measurements PT)    Recommendations for Other Services       Precautions / Restrictions Precautions Precautions: Fall Precaution Comments: scrotal edema Required Braces or Orthoses: Other Brace Other Brace: OT made scrotal sling Restrictions Weight Bearing Restrictions: No     Mobility  Bed Mobility Overal bed mobility: Needs Assistance Bed Mobility: Supine to Sit     Supine to sit: Mod assist, HOB elevated     General bed mobility comments: assist to scoot hips, move R leg and pt needing HHA to lift trunk upright    Transfers Overall transfer level: Needs assistance Equipment used: Rolling walker (2 wheels) Transfers: Sit to/from Stand, Bed to chair/wheelchair/BSC Sit to Stand: Min assist, From elevated surface   Step pivot transfers: Min assist       General transfer comment: some lifting help to stand at bedside with RW and limited R weight bearing with pain, min asssit for sequencing and RW management to stwep pivot bed>chair    Ambulation/Gait                   Stairs             Wheelchair Mobility    Modified Rankin (Stroke Patients Only)       Balance Overall balance assessment: Needs assistance Sitting-balance support: Feet supported Sitting balance-Leahy Scale: Fair     Standing balance support: Reliant on assistive device for balance, Bilateral upper extremity supported Standing balance-Leahy Scale: Poor Standing balance comment: UE support due to pain  Cognition Arousal/Alertness: Awake/alert Behavior During Therapy: WFL for tasks assessed/performed Overall Cognitive Status: Within Functional Limits for tasks assessed                                 General Comments: Following all commands, increased  time but due to pain.        Exercises General Exercises - Lower Extremity Ankle Circles/Pumps: AAROM, Right, Seated    General Comments        Pertinent Vitals/Pain Pain Assessment Pain Assessment: Faces Faces Pain Scale: Hurts worst Pain Location: R hip Pain Descriptors / Indicators: Sharp, Aching Pain Intervention(s): Monitored during session, Limited activity within patient's tolerance, Repositioned, Premedicated before session    Home Living                          Prior Function            PT Goals (current goals can now be found in the care plan section) Acute Rehab PT Goals Patient Stated Goal: to return to independent PT Goal Formulation: With patient Time For Goal Achievement: 03/12/22    Frequency    Min 4X/week      PT Plan      Co-evaluation              AM-PAC PT "6 Clicks" Mobility   Outcome Measure  Help needed turning from your back to your side while in a flat bed without using bedrails?: Total Help needed moving from lying on your back to sitting on the side of a flat bed without using bedrails?: Total Help needed moving to and from a bed to a chair (including a wheelchair)?: A Lot Help needed standing up from a chair using your arms (e.g., wheelchair or bedside chair)?: A Lot Help needed to walk in hospital room?: A Lot Help needed climbing 3-5 steps with a railing? : Total 6 Click Score: 9    End of Session Equipment Utilized During Treatment: Gait belt Activity Tolerance: Patient limited by pain Patient left: with call bell/phone within reach;in chair;with chair alarm set Nurse Communication: Mobility status PT Visit Diagnosis: Difficulty in walking, not elsewhere classified (R26.2);Pain;History of falling (Z91.81) Pain - Right/Left: Right Pain - part of body: Hip     Time: 5625-6389 PT Time Calculation (min) (ACUTE ONLY): 35 min  Charges:  $Therapeutic Activity: 23-37 mins                     Alyssandra Hulsebus R.  PTA Acute Rehabilitation Services Office: [place] 02/27/2022, 2:23 PM

## 2022-02-27 NOTE — Progress Notes (Signed)
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  Making urine but may not be enough to get by without dialysis.  I discussed this with the pt and he is in agreement if we need to proceed - continue NS - serial BMP checks - Foley - may need RRT - aggressive K management as below--> now normalized - if Cr continues to uptrend we may need to do HD but for now no indication--> not uremic and looks like rate of rise decreasing - forced diuresis today- lasix 80 IV x 1 in addition to fluids   2.  Hyperkalemia: rhabdo + losartan + NSAIDs             - calcium, insulin/ dextrose in ED             - s/p bicarb gtt             - s/p albuterol 10 mg neb             - s/p lokelma TID             - if no improvement and UOP not great will do RRT - on empiric antibiotics with linezolid and zosyn-- - ortho c/s, appreciate assistance   3.  Hypocalcemia:             - raggressive repletion   4.  HTN             - holding losartan   5.  Polysubstance abuse             - CIWA protocol   6.  Elevated LFTS:             - likely d/t rhabdo, consider RUQ imaging, exam benign, per primary  7.  Acute CVA  - workup per neuro   8.  Dispo: admitted  Subjective:    CK downtrending albeit slowly.  Ortho following, no surgical intervention recommended.  Still making lots of urine- I/O doesn't seem to be accurate.   Objective:   BP (!) 160/110 (BP Location: Right Arm)   Pulse 80   Temp 98.5 F (36.9 C) (Oral)   Resp 20   Ht 6' (1.829 m)   Wt 77.3 kg   SpO2 96%   BMI 23.11 kg/m   Intake/Output Summary (Last 24 hours) at 02/27/2022 1118 Last data filed at 02/27/2022 0815 Gross per 24 hour  Intake 3829.28 ml  Output 800 ml  Net 3029.28 ml   Weight change:   Physical Exam: GEN: awake and alert, sitting in bed HEENT EOMI PERRL, poor dentition NECK some increased JVD today PULM clear CV tachycardic ABD thin EXT no  edema NEURO AAO x 3 SKIN dry, ruddy complexion, mult tattooos MSK severe R hip pain, trying to move  Imaging: ECHOCARDIOGRAM COMPLETE  Result Date: 02/25/2022    ECHOCARDIOGRAM REPORT   Patient Name:   Franklin Anderson Date of Exam: 02/25/2022 Medical Rec #:  [id number]      Height:       72.0 in Accession #:    [id number]     Weight:       125.0 lb Date of Birth:  [birth year]     BSA:          1.745 m Patient Age:    51 years       BP:           174/116  mmHg Patient Gender: M              HR:           83 bpm. Exam Location:  Inpatient Procedure: 2D Echo, Color Doppler and Cardiac Doppler Indications:    Stroke  History:        Patient has no prior history of Echocardiogram examinations.                 Signs/Symptoms:Murmur; Risk Factors:Hypertension, Family History                 of Coronary Artery Disease and Current Smoker. Polysubstance                 abuse, rheumatic fever (as a child).  Sonographer:    Eartha Inch Referring Phys: [id number]  Sonographer Comments: Image acquisition challenging due to patient body habitus and Image acquisition challenging due to respiratory motion. IMPRESSIONS  1. Left ventricular ejection fraction, by estimation, is 55%. The left ventricle has normal function. The left ventricle has no regional wall motion abnormalities. There is mild concentric left ventricular hypertrophy. Left ventricular diastolic parameters are consistent with Grade I diastolic dysfunction (impaired relaxation).  2. Right ventricular systolic function is normal. The right ventricular size is normal. Tricuspid regurgitation signal is inadequate for assessing PA pressure.  3. The mitral valve is normal in structure. Trivial mitral valve regurgitation. No evidence of mitral stenosis.  4. The aortic valve is tricuspid. Aortic valve regurgitation is trivial. No aortic stenosis is present.  5. The inferior vena cava is normal in size with greater than 50% respiratory variability, suggesting  right atrial pressure of 3 mmHg. FINDINGS  Left Ventricle: Left ventricular ejection fraction, by estimation, is 55%. The left ventricle has normal function. The left ventricle has no regional wall motion abnormalities. The left ventricular internal cavity size was normal in size. There is mild concentric left ventricular hypertrophy. Left ventricular diastolic parameters are consistent with Grade I diastolic dysfunction (impaired relaxation). Right Ventricle: The right ventricular size is normal. No increase in right ventricular wall thickness. Right ventricular systolic function is normal. Tricuspid regurgitation signal is inadequate for assessing PA pressure. Left Atrium: Left atrial size was normal in size. Right Atrium: Right atrial size was normal in size. Pericardium: Trivial pericardial effusion is present. Mitral Valve: The mitral valve is normal in structure. Trivial mitral valve regurgitation. No evidence of mitral valve stenosis. MV peak gradient, 4.0 mmHg. The mean mitral valve gradient is 2.0 mmHg. Tricuspid Valve: The tricuspid valve is normal in structure. Tricuspid valve regurgitation is not demonstrated. Aortic Valve: The aortic valve is tricuspid. Aortic valve regurgitation is trivial. No aortic stenosis is present. Pulmonic Valve: The pulmonic valve was normal in structure. Pulmonic valve regurgitation is not visualized. Aorta: The aortic root is normal in size and structure. Venous: The inferior vena cava is normal in size with greater than 50% respiratory variability, suggesting right atrial pressure of 3 mmHg. IAS/Shunts: No atrial level shunt detected by color flow Doppler.  LEFT VENTRICLE PLAX 2D LVIDd:         4.50 cm      Diastology LVIDs:         4.00 cm      LV e' medial:    8.55 cm/s LV PW:         1.20 cm      LV E/e' medial:  7.8 LV IVS:        0.90  cm      LV e' lateral:   8.86 cm/s LVOT diam:     2.10 cm      LV E/e' lateral: 7.5 LV SV:         67 LV SV Index:   38 LVOT Area:      3.46 cm  LV Volumes (MOD) LV vol d, MOD A2C: 93.4 ml LV vol d, MOD A4C: 149.0 ml LV vol s, MOD A2C: 52.3 ml LV vol s, MOD A4C: 84.5 ml LV SV MOD A2C:     41.1 ml LV SV MOD A4C:     149.0 ml LV SV MOD BP:      48.2 ml RIGHT VENTRICLE             IVC RV S prime:     15.50 cm/s  IVC diam: 2.00 cm TAPSE (M-mode): 1.8 cm LEFT ATRIUM             Index        RIGHT ATRIUM           Index LA diam:        2.90 cm 1.66 cm/m   RA Area:     13.90 cm LA Vol (A2C):   63.8 ml 36.57 ml/m  RA Volume:   34.50 ml  19.77 ml/m LA Vol (A4C):   28.5 ml 16.33 ml/m LA Biplane Vol: 43.1 ml 24.70 ml/m  AORTIC VALVE LVOT Vmax:   132.00 cm/s LVOT Vmean:  87.200 cm/s LVOT VTI:    0.193 m  AORTA Ao Root diam: 2.90 cm Ao Asc diam:  3.20 cm MITRAL VALVE MV Area (PHT): 3.06 cm    SHUNTS MV Area VTI:   2.88 cm    Systemic VTI:  0.19 m MV Peak grad:  4.0 mmHg    Systemic Diam: 2.10 cm MV Mean grad:  2.0 mmHg MV Vmax:       1.00 m/s MV Vmean:      64.2 cm/s MV Decel Time: 248 msec MV E velocity: 66.70 cm/s MV A velocity: 68.90 cm/s MV E/A ratio:  0.97 Dalton McleanMD Electronically signed by Franki Monte Signature Date/Time: 02/25/2022/3:04:29 PM    Final     Labs: BMET Recent Labs  Lab 02/24/22 0205 02/24/22 [id number] 02/24/22 [id number] 02/24/22 1231 02/24/22 1947 02/24/22 1950 02/25/22 0332 02/26/22 0349 02/27/22 0326  NA 135   < > 138 138 136 134* 133* 135 136  K 5.7*   < > 6.1* 5.7* 4.3 4.2 3.6 3.7 3.6  CL 105  --  107 105 99  --  95* 88* 90*  CO2 17*  --  18* 19* 20*  --  23 31 31   GLUCOSE 111*  --  126* 95 156*  --  162* 131* 106*  BUN 63*  --  62* 71* 75*  --  74* 79* 85*  CREATININE 3.44*  --  3.53* 4.12* 4.77*  --  5.41* 7.07* 8.30*  CALCIUM 6.2*  --  6.5* 6.8* 6.7*  --  6.4* 6.1* 6.2*  PHOS  --   --   --  6.6* 6.0*  --   --   --   --    < > = values in this interval not displayed.   CBC Recent Labs  Lab 02/23/22 2008 02/23/22 2016 02/24/22 0338 02/24/22 1950 02/25/22 0332 02/26/22 0349 02/27/22 0326  WBC  16.4*   < > 16.0*  --  9.8 7.5 6.7  NEUTROABS 14.3*  --   --   --   --   --   --  HGB 17.3*   < > 17.2* 13.9 13.4 12.1* 12.1*  HCT 53.1*   < > 50.2 41.0 38.1* 34.5* 34.1*  MCV 97.8   < > 96.5  --  92.3 92.5 91.9  PLT 349   < > 283  --  198 178 182   < > = values in this interval not displayed.    Medications:     amLODipine  10 mg Oral Daily   aspirin EC  325 mg Oral Daily   calcium carbonate  2 tablet Oral TID   Chlorhexidine Gluconate Cloth  6 each Topical Daily   cloNIDine  0.1 mg Oral BID   furosemide  80 mg Intravenous Once   heparin  5,000 Units Subcutaneous Q8H   metoprolol tartrate  50 mg Oral BID   Franklin Lips, MD 02/27/2022, 11:18 AM

## 2022-02-27 NOTE — Progress Notes (Addendum)
STROKE TEAM PROGRESS NOTE   INTERVAL HISTORY Patient laying in bed in NAD. He c/o pain in right leg. Able to move that right leg more today but very painful. No new neurological events overnight. Neuro exam is stable.   Vitals:   02/26/22 2000 02/27/22 0027 02/27/22 0354 02/27/22 0734  BP: (!) 156/97 (!) 155/100  (!) 170/99  Pulse: 89 78 80 80  Resp: 16 20 15 18   Temp: 98.4 F (36.9 C) 98.6 F (37 C) 98.4 F (36.9 C) 98.7 F (37.1 C)  TempSrc: Oral Oral Oral Oral  SpO2: 92% 94% 92% (!) 89%  Weight:      Height:       CBC:  Recent Labs  Lab 02/23/22 2008 02/23/22 2016 02/26/22 0349 02/27/22 0326  WBC 16.4*   < > 7.5 6.7  NEUTROABS 14.3*  --   --   --   HGB 17.3*   < > 12.1* 12.1*  HCT 53.1*   < > 34.5* 34.1*  MCV 97.8   < > 92.5 91.9  PLT 349   < > 178 182   < > = values in this interval not displayed.    Basic Metabolic Panel:  Recent Labs  Lab 02/24/22 1231 02/24/22 1947 02/24/22 1950 02/26/22 0349 02/27/22 0326  NA 138 136   < > 135 136  K 5.7* 4.3   < > 3.7 3.6  CL 105 99   < > 88* 90*  CO2 19* 20*   < > 31 31  GLUCOSE 95 156*   < > 131* 106*  BUN 71* 75*   < > 79* 85*  CREATININE 4.12* 4.77*   < > 7.07* 8.30*  CALCIUM 6.8* 6.7*   < > 6.1* 6.2*  PHOS 6.6* 6.0*  --   --   --    < > = values in this interval not displayed.    Lipid Panel:  Recent Labs  Lab 02/24/22 0454  CHOL 102  TRIG 49  HDL 34*  CHOLHDL 3.0  VLDL 10  LDLCALC 58    HgbA1c:  Recent Labs  Lab 02/24/22 0205  HGBA1C 5.7*    Urine Drug Screen:  Recent Labs  Lab 02/23/22 2323  LABOPIA NONE DETECTED  COCAINSCRNUR POSITIVE*  LABBENZ POSITIVE*  AMPHETMU POSITIVE*  THCU NONE DETECTED  LABBARB NONE DETECTED     Alcohol Level  Recent Labs  Lab 02/23/22 2008  ETH <10     IMAGING past 24 hours No results found.  PHYSICAL EXAM  Temp:  [97.9 F (36.6 C)-98.7 F (37.1 C)] 98.7 F (37.1 C) (11/03 0734) Pulse Rate:  [76-89] 80 (11/03 0734) Resp:  [15-20] 18  (11/03 0734) BP: (147-170)/(95-100) 170/99 (11/03 0734) SpO2:  [89 %-96 %] 89 % (11/03 0734)  General - Well nourished, well developed, in no apparent distress. Cardiovascular - Regular rhythm and rate.  Mental Status -  Level of arousal and orientation to time, place, and person is intact.He was able to recite place ([place]), month/November and year (2023) He is aware of  reasoning his hospitalization  Language including expression, naming, repetition, comprehension was assessed and found intact. Attention span and concentration were normal. Recent and remote memory were intact. Fund of Knowledge was assessed and was intact.  Cranial Nerves II - XII - II - Visual field intact OU. III, IV, VI - Extraocular movements intact. V - Facial sensation intact bilaterally. VII - Facial movement intact bilaterally. VIII - Hearing & vestibular intact  bilaterally. X - Palate elevates symmetrically. XI - Chin turning & shoulder shrug intact bilaterally. XII - Tongue protrusion intact.  Motor Strength - The patient's strength was normal in bilateral uppers and left lower and pronator drift was absent.  Right lower unable to lift leg due to swelling and pain, but able to wiggle toes slowly. Bulk was normal and fasciculations were absent.   Motor Tone - Muscle tone was assessed at the neck and appendages and was normal.  Sensory - Light touch, temperature were assessed and were symmetrical.    Coordination - The patient had normal movements in the hands and feet with no ataxia or dysmetria.  Tremor was absent.  Gait and Station - deferred.   ASSESSMENT/PLAN Mr. ANTWANN PREZIOSI is a 51 y.o. male with history of polysubstance abuse, essential hypertension , rheumatic fever with subsequent heart murmur who presents to ED VIA EMS after being found down at home in his own feces,after falling out of bed last pm. 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 Severe rhabdomyolysis   Code Stroke CT head No acute abnormality. 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 MRI acute infarcts in the left cerebellum and in the corona radiata on the right.   MRA head No emergent large vessel occlusion or proximal hemodynamically significant stenosis. Carotid Doppler unremarkable 2D Echo EF 55% Recommend TEE when stable to rule out endocarditis and for stroke work up given young age [place] LE bilateral negative for DVT LDL 42 HgbA1c 5.7 UDS positive for cocaine, amphetamine, benzo VTE prophylaxis - Heparin No antithrombotic prior to admission, now on aspirin 325 mg daily. No DAPT in case needing surgical intervention Therapy recommendations:  CIR Disposition:  pending  Severe rhabdomyolysis AKI CK significantly elevated, trending down today @ 223,566 AST and ALT also elevated and trending down Cre 3.33->4.12->5.41 > 7.07->8.30 Nephrology consulted - may need CRRT BMP monitoring  Right leg swelling Could be related to severe rhabdo Ortho on board On zyvox and zosyn ? Compartment syndrome No surgical intervention at this time  Hypertension Home meds:  losartan metoprolol Significantly elevated Now on norvasc, clonidine, metorolol Long-term BP goal normotensive  Hyperlipidemia Home meds:  none LDL 58, goal < 70 High intensity statin not indicated below goal   Tobacco abuse Current smoker Smoking cessation counseling provided Nicotine patch provided Pt is willing to quit  Cocaine abuse Cessation education provided.  Other Stroke Risk Factors Hx stroke/TIA by imaging  Neurology will sign off. Please call with questions or concerns   Hospital day # 3   Beulah Gandy DNP, ACNPC-AG   ATTENDING NOTE: I reviewed above note and agree with the assessment and plan.    Neuro stable overnight, no acute event. Still has right leg pain, able to wiggle toes but still painful. Cre continues to rise and nephrology consulted and may need CRRT. Continue ASA for now given potential procedure and surgery. Cessation of illicit drug and smoking education again provided. Recommend TEE for stroke work up and to r/u endocarditis once stable.   For detailed assessment and plan, please refer to above/below as I have made changes wherever appropriate.   Neurology will follow peripherally and after clinically more stabilized. Please feel free to call us with questions in the meantime.  Marvel Plan, MD PhD Stroke Neurology 02/27/2022 6:18 PM    To contact Stroke Continuity provider, please refer to [email or web address]. After hours, contact General Neurology

## 2022-02-27 NOTE — TOC Progression Note (Signed)
Transition of Care [facility]) - Progression Note    Patient Details  Name: Franklin Anderson MRN: [id number] Date of Birth: Mar 05, 1971  Transition of [facility]) CM/SW Contact  Pollie Friar, RN Phone Number: 02/27/2022, 11:42 AM  Clinical Narrative:    CIR following for admission.  SDOH Interventions Today    Flowsheet Row Most Recent Value  SDOH Interventions   Utilities Interventions Inpatient TOC  Financial Strain Interventions Patient Refused  [pt denies]         Expected Discharge Plan: Home/Self Care Barriers to Discharge: Continued Medical Work up  Expected Discharge Plan and Services Expected Discharge Plan: Home/Self Care       Living arrangements for the past 2 months: Single Family Home                                       Social Determinants of Health (SDOH) Interventions Utilities Interventions: Inpatient TOC Financial Strain Interventions: Patient Refused (pt denies)  Readmission Risk Interventions     No data to display

## 2022-02-28 LAB — CBC
HCT: 38 % — ABNORMAL LOW (ref 39.0–52.0)
Hemoglobin: 13.2 g/dL (ref 13.0–17.0)
MCH: 32.5 pg (ref 26.0–34.0)
MCHC: 34.7 g/dL (ref 30.0–36.0)
MCV: 93.6 fL (ref 80.0–100.0)
Platelets: 201 10*3/uL (ref 150–400)
RBC: 4.06 MIL/uL — ABNORMAL LOW (ref 4.22–5.81)
RDW: 12.3 % (ref 11.5–15.5)
WBC: 7.4 10*3/uL (ref 4.0–10.5)
nRBC: 0 % (ref 0.0–0.2)

## 2022-02-28 LAB — HCV RNA QUANT
HCV Quantitative Log: 6.498 log10 IU/mL (ref >1.70)
HCV Quantitative: 3150000 IU/mL (ref >50)

## 2022-02-28 LAB — COMPREHENSIVE METABOLIC PANEL
ALT: 313 U/L — ABNORMAL HIGH (ref 0–44)
AST: 368 U/L — ABNORMAL HIGH (ref 15–41)
Albumin: 2.1 g/dL — ABNORMAL LOW (ref 3.5–5.0)
Alkaline Phosphatase: 49 U/L (ref 38–126)
Anion gap: 18 — ABNORMAL HIGH (ref 5–15)
BUN: 91 mg/dL — ABNORMAL HIGH (ref 6–20)
CO2: 27 mmol/L (ref 22–32)
Calcium: 7.5 mg/dL — ABNORMAL LOW (ref 8.9–10.3)
Chloride: 94 mmol/L — ABNORMAL LOW (ref 98–111)
Creatinine, Ser: 8.48 mg/dL — ABNORMAL HIGH (ref 0.61–1.24)
GFR, Estimated: 7 mL/min — ABNORMAL LOW (ref >60)
Glucose, Bld: 136 mg/dL — ABNORMAL HIGH (ref 70–99)
Potassium: 3.8 mmol/L (ref 3.5–5.1)
Sodium: 139 mmol/L (ref 135–145)
Total Bilirubin: 0.8 mg/dL (ref 0.3–1.2)
Total Protein: 4.7 g/dL — ABNORMAL LOW (ref 6.5–8.1)

## 2022-02-28 LAB — GLUCOSE, CAPILLARY: Glucose-Capillary: 122 mg/dL — ABNORMAL HIGH (ref 70–99)

## 2022-02-28 LAB — CK: Total CK: 17819 U/L — ABNORMAL HIGH (ref 49–397)

## 2022-02-28 MED ORDER — METOPROLOL TARTRATE 50 MG PO TABS
75.0000 mg | ORAL_TABLET | Freq: Two times a day (BID) | ORAL | Status: DC
  Administered 2022-02-28 – 2022-03-01 (×2): 75 mg via ORAL
  Filled 2022-02-28 (×2): qty 1

## 2022-02-28 NOTE — Plan of Care (Signed)
  Problem: Education: Goal: Knowledge of disease or condition will improve Outcome: Progressing Goal: Knowledge of secondary prevention will improve (MUST DOCUMENT ALL) Outcome: Progressing Goal: Knowledge of patient specific risk factors will improve [place] N/A or DELETE if not current risk factor) Outcome: Progressing   Problem: Ischemic Stroke/TIA Tissue Perfusion: Goal: Complications of ischemic stroke/TIA will be minimized Outcome: Progressing   Problem: Coping: Goal: Will verbalize positive feelings about self Outcome: Progressing Goal: Will identify appropriate support needs Outcome: Progressing   Problem: Health Behavior/Discharge Planning: Goal: Ability to manage health-related needs will improve Outcome: Progressing Goal: Goals will be collaboratively established with patient/family Outcome: Progressing   Problem: Self-Care: Goal: Ability to participate in self-care as condition permits will improve Outcome: Progressing Goal: Verbalization of feelings and concerns over difficulty with self-care will improve Outcome: Progressing Goal: Ability to communicate needs accurately will improve Outcome: Progressing   Problem: Nutrition: Goal: Risk of aspiration will decrease Outcome: Progressing Goal: Dietary intake will improve Outcome: Progressing   Problem: Education: Goal: Knowledge of General Education information will improve Description: Including pain rating scale, medication(s)/side effects and non-pharmacologic comfort measures Outcome: Progressing   Problem: Health Behavior/Discharge Planning: Goal: Ability to manage health-related needs will improve Outcome: Progressing   Problem: Clinical Measurements: Goal: Ability to maintain clinical measurements within normal limits will improve Outcome: Progressing Goal: Will remain free from infection Outcome: Progressing Goal: Diagnostic test results will improve Outcome: Progressing Goal: Respiratory  complications will improve Outcome: Progressing Goal: Cardiovascular complication will be avoided Outcome: Progressing   Problem: Activity: Goal: Risk for activity intolerance will decrease Outcome: Progressing   Problem: Nutrition: Goal: Adequate nutrition will be maintained Outcome: Progressing   Problem: Coping: Goal: Level of anxiety will decrease Outcome: Progressing   Problem: Elimination: Goal: Will not experience complications related to bowel motility Outcome: Progressing Goal: Will not experience complications related to urinary retention Outcome: Progressing   Problem: Safety: Goal: Ability to remain free from injury will improve Outcome: Progressing   Problem: Skin Integrity: Goal: Risk for impaired skin integrity will decrease Outcome: Progressing

## 2022-02-28 NOTE — Progress Notes (Signed)
PROGRESS NOTE    Franklin Anderson  BWL:[id number] DOB: [birth year] DOA: 02/23/2022 PCP: Cain Saupe, MD   Chief Complaint  Patient presents with   Code Stroke    Brief Narrative:    Franklin Anderson is a 51 y.o. male with medical history significant of polysubstance abuse, essential hypertension , rheumatic fever with subsequent heart murmur who presents to ED BIB EMS after being found down at home in his own feces,after falling out of bed last pm.  Arrival to ED patient was noted to have significant right-sided swelling, pain, CT of right hip was significant for severe muscle inflammation of the hip/gluteal muscle edema, as well he was noted to have acute renal failure, and work-up significant for CVA.  Assessment & Plan:   Principal Problem:   Change in mental status Active Problems:   Uncontrolled hypertension   Polysubstance abuse (HCC)  AKI Severe rhabdomyolysis -Renal input greatly appreciated, total CK improving on IV fluids -Creatinine continues to trend up, but appears to be finally plateauing  -Continue with aggressive hydration with IV normal sent on 50 cc/h, as needed Lasix per renal . -Continue with Foley catheter  -May need RRT,  -Bicarb Drip has been discontinued as bicarb level is normalized  Acute Metabolic  Encephalopathy  -due to polysubstance abuse and acute CVA . -Mental status significantly improved .  Hyperkalemia -Improved,  Hypocalcemia -Repleted, continue to monitor  Hypertension -Blood pressure significantly elevated, (patient is out of permissive HTN period per neuro note) -Being on clonidine and amlodipine, will increase metoprolol to 75 mg twice daily for better control.     Acute CVA -He denies any IV drug abuse, -Neurology on board, may need TEE once stable  Polysubstance abuse - CIWA protocol -Patient denies any IV drug abuse, reports he just snorted, he is adamant that no IV drug abuse since 2020.    Elevated LFTS: -Most likely  due to above, trending down. 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  Marland Kitchen  Follow on HCV RNA.   Severe muscle inflammation of right hip and gluteal muscle/crush injury -Is most likely due to severe rhabdomyolysis/myositis/ crush injury-most likely in the setting of cocaine use, as well possibly related to laying on the floor for unknown period of time  -No orthopedic surgery recommended at this time per orthopedic .     DVT prophylaxis: [place] heaprin Code Status: Full Family Communication: discussed with his wife per his permission. Disposition:   Status is: Inpatient    Consultants:  Neurology Nephrology Orthopedic   Subjective:  No significant events overnight, he had 5000 cc urine output after receiving Lasix  Objective: Vitals:   02/27/22 2028 02/28/22 0027 02/28/22 0405 02/28/22 0810  BP: (!) 173/97 (!) 170/107 (!) 163/105 (!) 177/106  Pulse: 90 81 85 89  Resp: 16 18 20 18   Temp: 97.6 F (36.4 C) 98 F (36.7 C) 98.2 F (36.8 C) 98.1 F (36.7 C)  TempSrc: Oral Oral Oral Oral  SpO2: 90% 91% 90% 92%  Weight:      Height:        Intake/Output Summary (Last 24 hours) at 02/28/2022 1127 Last data filed at 02/28/2022 0904 Gross per 24 hour  Intake 3804.56 ml  Output 4775 ml  Net -970.44 ml   Filed Weights   02/23/22 2046 02/26/22 0500  Weight: 56.7 kg 77.3 kg    Examination:   Awake Alert, Oriented X 3, frail, appears older  than stated age Symmetrical Chest wall movement, Good air movement bilaterally, CTAB RRR,No Gallops,Rubs or new Murmurs, No Parasternal Heave +ve B.Sounds, Abd Soft, No tenderness, No rebound - guarding or rigidity. Right lower extremity edema, significant scrotal edema  Foley present   Data Reviewed: I have personally reviewed following labs and imaging studies  CBC: Recent Labs  Lab 02/23/22 2008 02/23/22 2016 02/24/22 0338 02/24/22 1950 02/25/22 0332 02/26/22 0349 02/27/22 0326  02/28/22 0317  WBC 16.4*   < > 16.0*  --  9.8 7.5 6.7 7.4  NEUTROABS 14.3*  --   --   --   --   --   --   --   HGB 17.3*   < > 17.2* 13.9 13.4 12.1* 12.1* 13.2  HCT 53.1*   < > 50.2 41.0 38.1* 34.5* 34.1* 38.0*  MCV 97.8   < > 96.5  --  92.3 92.5 91.9 93.6  PLT 349   < > 283  --  198 178 182 201   < > = values in this interval not displayed.    Basic Metabolic Panel: Recent Labs  Lab 02/24/22 1231 02/24/22 1947 02/24/22 1950 02/25/22 0332 02/26/22 0349 02/27/22 0326 02/28/22 0317  NA 138 136 134* 133* 135 136 139  K 5.7* 4.3 4.2 3.6 3.7 3.6 3.8  CL 105 99  --  95* 88* 90* 94*  CO2 19* 20*  --  23 31 31 27   GLUCOSE 95 156*  --  162* 131* 106* 136*  BUN 71* 75*  --  74* 79* 85* 91*  CREATININE 4.12* 4.77*  --  5.41* 7.07* 8.30* 8.48*  CALCIUM 6.8* 6.7*  --  6.4* 6.1* 6.2* 7.5*  PHOS 6.6* 6.0*  --   --   --   --   --     GFR: Estimated Creatinine Clearance: 11.3 mL/min (A) (by C-G formula based on SCr of 8.48 mg/dL (H)).  Liver Function Tests: Recent Labs  Lab 02/24/22 0338 02/24/22 1231 02/24/22 1947 02/25/22 0332 02/26/22 0349 02/27/22 0326 02/28/22 0317  AST 1,173*  --   --  645* 546* 518* 368*  ALT 627*  --   --  440* 362* 351* 313*  ALKPHOS 89  --   --  62 55 51 49  BILITOT 0.7  --   --  0.6 0.8 0.8 0.8  PROT 6.2*  --   --  4.2* 4.1* 4.6* 4.7*  ALBUMIN 3.1*   < > 2.3* 2.1* 1.9* 1.9* 2.1*   < > = values in this interval not displayed.    CBG: Recent Labs  Lab 02/23/22 2008 02/24/22 0747 02/26/22 0826 02/28/22 0606  GLUCAP 142* 131* 142* 122*     Recent Results (from the past 240 hour(s))  Urine Culture     Status: None   Collection Time: 02/23/22 11:23 PM   Specimen: Urine, Clean Catch  Result Value Ref Range Status   Specimen Description URINE, CLEAN CATCH  Final   Special Requests NONE  Final   Culture   Final    NO GROWTH Performed at [facility], [place] [address] [place],  [address]    Report Status 02/25/2022 FINAL  Final   Culture, blood (Routine X 2) w Reflex to ID Panel     Status: None (Preliminary result)   Collection Time: 02/24/22  3:38 AM   Specimen: BLOOD LEFT HAND  Result Value Ref Range Status   Specimen Description BLOOD LEFT HAND  Final  Special Requests   Final    BOTTLES DRAWN AEROBIC ONLY Blood Culture results may not be optimal due to an inadequate volume of blood received in culture bottles   Culture   Final    NO GROWTH 4 DAYS Performed at [facility], [address] [place], [place] [address]    Report Status PENDING  Incomplete  Culture, blood (Routine X 2) w Reflex to ID Panel     Status: None (Preliminary result)   Collection Time: 02/24/22  3:41 AM   Specimen: BLOOD  Result Value Ref Range Status   Specimen Description BLOOD LEFT ANTECUBITAL  Final   Special Requests   Final    BOTTLES DRAWN AEROBIC AND ANAEROBIC Blood Culture adequate volume   Culture   Final    NO GROWTH 4 DAYS Performed at [facility], [address], [place], [place] [address]    Report Status PENDING  Incomplete         Radiology Studies: No results found.      Scheduled Meds:  amLODipine  10 mg Oral Daily   aspirin EC  325 mg Oral Daily   calcium carbonate  2 tablet Oral TID   Chlorhexidine Gluconate Cloth  6 each Topical Daily   cloNIDine  0.1 mg Oral BID   heparin  5,000 Units Subcutaneous Q8H   metoprolol tartrate  50 mg Oral BID   Continuous Infusions:  sodium chloride 150 mL/hr at 02/28/22 0904     LOS: 4 days       Huey Bienenstock, MD [facility]   To contact the attending provider between 7A-7P or the covering provider during after hours 7P-7A, please log into the web site [email or web address] and access using universal [place] password for that web site. If you do not have the password, please call the hospital operator.  02/28/2022, 11:27 AM

## 2022-02-28 NOTE — Progress Notes (Signed)
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  Making urine but may not be enough to get by without dialysis.  I discussed this with the pt and he is in agreement if we need to proceed - continue NS - serial BMP checks - Foley - may need RRT - aggressive K management as below--> now normalized - Cr finally plateauing, got Lasix 80 IV x 1 with almost 5l UOP yesterday, will hold today   2.  Hyperkalemia: rhabdo + losartan + NSAIDs             - calcium, insulin/ dextrose in ED             - s/p bicarb gtt             - s/p albuterol 10 mg neb             - s/p lokelma TID     - on empiric antibiotics with linezolid and zosyn-- - ortho c/s, appreciate assistance   3.  Hypocalcemia:             - aggressive repletion  - TUMS 2 TID   - better   4.  HTN             - holding losartan  - amlodipine, clonidine, metop   5.  Polysubstance abuse             - CIWA protocol   6.  Elevated LFTS:             - likely d/t rhabdo, consider RUQ imaging, exam benign, per primary  7.  Acute CVA  - workup per neuro   8.  Dispo: admitted  Subjective:    CK improving, Cr plateauing and UOP better/     Objective:   BP (!) 177/106 (BP Location: Left Arm)   Pulse 89   Temp 98.1 F (36.7 C) (Oral)   Resp 18   Ht 6' (1.829 m)   Wt 77.3 kg   SpO2 92%   BMI 23.11 kg/m   Intake/Output Summary (Last 24 hours) at 02/28/2022 1023 Last data filed at 02/28/2022 2993 Gross per 24 hour  Intake 3804.56 ml  Output 4775 ml  Net -970.44 ml   Weight change:   Physical Exam: GEN: awake and alert, sitting in bed HEENT EOMI PERRL, poor dentition NECK some increased JVD today PULM clear CV tachycardic ABD thin EXT no edema NEURO AAO x 3 SKIN dry, ruddy complexion, mult tattooos MSK severe R hip pain, trying to move  Imaging: No results found.  Labs: BMET Recent Labs  Lab 02/24/22 0338 02/24/22 1231  02/24/22 1947 02/24/22 1950 02/25/22 0332 02/26/22 0349 02/27/22 0326 02/28/22 0317  NA 138 138 136 134* 133* 135 136 139  K 6.1* 5.7* 4.3 4.2 3.6 3.7 3.6 3.8  CL 107 105 99  --  95* 88* 90* 94*  CO2 18* 19* 20*  --  23 31 31 27   GLUCOSE 126* 95 156*  --  162* 131* 106* 136*  BUN 62* 71* 75*  --  74* 79* 85* 91*  CREATININE 3.53* 4.12* 4.77*  --  5.41* 7.07* 8.30* 8.48*  CALCIUM 6.5* 6.8* 6.7*  --  6.4* 6.1* 6.2* 7.5*  PHOS  --  6.6* 6.0*  --   --   --   --   --    CBC Recent Labs  Lab 02/23/22  2008 02/23/22 2016 02/25/22 0332 02/26/22 0349 02/27/22 0326 02/28/22 0317  WBC 16.4*   < > 9.8 7.5 6.7 7.4  NEUTROABS 14.3*  --   --   --   --   --   HGB 17.3*   < > 13.4 12.1* 12.1* 13.2  HCT 53.1*   < > 38.1* 34.5* 34.1* 38.0*  MCV 97.8   < > 92.3 92.5 91.9 93.6  PLT 349   < > 198 178 182 201   < > = values in this interval not displayed.    Medications:     amLODipine  10 mg Oral Daily   aspirin EC  325 mg Oral Daily   calcium carbonate  2 tablet Oral TID   Chlorhexidine Gluconate Cloth  6 each Topical Daily   cloNIDine  0.1 mg Oral BID   heparin  5,000 Units Subcutaneous Q8H   metoprolol tartrate  50 mg Oral BID   Bufford Buttner, MD 02/28/2022, 10:23 AM

## 2022-02-28 NOTE — Progress Notes (Signed)
Physical Therapy Treatment Patient Details Name: Franklin Anderson MRN: [id number] DOB: May 01, 1970 Today's Date: 02/28/2022   History of Present Illness Franklin Anderson is a 51 y.o. 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  Patient was noted to have significant right-sided swelling, pain, CT of right hip was significant for severe muscle inflammation of the hip/gluteal muscle edema, as well he was noted to have acute renal failure, and work-up significant for CVA.    PT Comments    Pt supine in bed remains in pain, which is his largest limitation.  Pt able to move to edge of bed with mod assistance, use of bed pad and support of RLE to move to edge of bed. Pt very painful but does flex RLE over bed.  Cues for hand placement to rise into standing.  Noticeable DOE during mobility.  Pt was able to take sidesteps to move to [facility].  Continue to recommend aggressive rehab in a post acute setting.    Recommendations for follow up therapy are one component of a multi-disciplinary discharge planning process, led by the attending physician.  Recommendations may be updated based on patient status, additional functional criteria and insurance authorization.  Follow Up Recommendations  Acute inpatient rehab (3hours/day)     Assistance Recommended at Discharge Intermittent Supervision/Assistance  Patient can return home with the following Two people to help with walking and/or transfers;Two people to help with bathing/dressing/bathroom   Equipment Recommendations  BSC/3in1;Rolling walker (2 wheels);Wheelchair (measurements PT)    Recommendations for Other Services Rehab consult     Precautions / Restrictions Precautions Precautions:  Fall Precaution Comments: scrotal edema Required Braces or Orthoses: Other Brace Other Brace: OT made scrotal sling Restrictions Weight Bearing Restrictions: No     Mobility  Bed Mobility Overal bed mobility: Needs Assistance Bed Mobility: Supine to Sit, Sit to Supine     Supine to sit: Mod assist, HOB elevated Sit to supine: Mod assist, HOB elevated   General bed mobility comments: assist to scoot hips, move R leg and pt needing HHA to lift trunk upright    Transfers Overall transfer level: Needs assistance Equipment used: Rolling walker (2 wheels) Transfers: Sit to/from Stand Sit to Stand: Min assist, From elevated surface           General transfer comment: Pt unable to stand from standard seat height due to edema from rhabdo. Elevated bed with cues for hand placement to and from seated surface.    Ambulation/Gait Ambulation/Gait assistance: Mod assist Gait Distance (Feet): 3 Feet Assistive device: Rolling walker (2 wheels)         General Gait Details: "shimmy" steps with R toward HOB x 5   Stairs             Wheelchair Mobility    Modified Rankin (Stroke Patients Only) Modified Rankin (Stroke Patients Only) Pre-Morbid Rankin Score: No symptoms Modified Rankin: Moderately severe disability     Balance Overall balance assessment: Needs assistance Sitting-balance support: Feet supported Sitting balance-Leahy Scale: Fair       Standing balance-Leahy Scale: Poor                              Cognition Arousal/Alertness: Awake/alert Behavior  During Therapy: WFL for tasks assessed/performed Overall Cognitive Status: Within Functional Limits for tasks assessed                                 General Comments: Following all commands, increased time but due to pain.        Exercises General Exercises - Lower Extremity Ankle Circles/Pumps: AAROM, Right, Supine    General Comments        Pertinent Vitals/Pain  Pain Assessment Pain Assessment: 0-10 Pain Score: 9  Pain Descriptors / Indicators: Sharp, Aching Pain Intervention(s): Monitored during session, Patient requesting pain meds-RN notified, RN gave pain meds during session (IV pain meds)    Home Living                          Prior Function            PT Goals (current goals can now be found in the care plan section) Acute Rehab PT Goals Patient Stated Goal: to return to independent Potential to Achieve Goals: Good Progress towards PT goals: Progressing toward goals    Frequency    Min 4X/week      PT Plan Current plan remains appropriate    Co-evaluation              AM-PAC PT "6 Clicks" Mobility   Outcome Measure  Help needed turning from your back to your side while in a flat bed without using bedrails?: Total Help needed moving from lying on your back to sitting on the side of a flat bed without using bedrails?: Total Help needed moving to and from a bed to a chair (including a wheelchair)?: A Lot Help needed standing up from a chair using your arms (e.g., wheelchair or bedside chair)?: A Lot Help needed to walk in hospital room?: A Lot Help needed climbing 3-5 steps with a railing? : Total 6 Click Score: 9    End of Session Equipment Utilized During Treatment: Gait belt Activity Tolerance: Patient limited by pain Patient left: with call bell/phone within reach;in chair;with chair alarm set Nurse Communication: Mobility status PT Visit Diagnosis: Difficulty in walking, not elsewhere classified (R26.2);Pain;History of falling (Z91.81) Pain - Right/Left: Right Pain - part of body: Hip     Time: 8299-3716 PT Time Calculation (min) (ACUTE ONLY): 32 min  Charges:  $Gait Training: 8-22 mins $Therapeutic Activity: 8-22 mins                     Erasmo Leventhal , PTA Acute Rehabilitation Services Office [phone]    Kaitland Lewellyn Eli Hose 02/28/2022, 1:53 PM

## 2022-02-28 NOTE — Progress Notes (Signed)
Inpatient Rehab Admissions Coordinator:  Pt pain limited in therapy. Also awaiting medical clearance. Will continue to follow.   Gayland Curry, [place], [place] Admissions Coordinator [phone]

## 2022-03-01 DIAGNOSIS — B1920 Unspecified viral hepatitis C without hepatic coma: Secondary | ICD-10-CM

## 2022-03-01 LAB — COMPREHENSIVE METABOLIC PANEL
ALT: 261 U/L — ABNORMAL HIGH (ref 0–44)
AST: 240 U/L — ABNORMAL HIGH (ref 15–41)
Albumin: 2.2 g/dL — ABNORMAL LOW (ref 3.5–5.0)
Alkaline Phosphatase: 52 U/L (ref 38–126)
Anion gap: 15 (ref 5–15)
BUN: 93 mg/dL — ABNORMAL HIGH (ref 6–20)
CO2: 26 mmol/L (ref 22–32)
Calcium: 7.9 mg/dL — ABNORMAL LOW (ref 8.9–10.3)
Chloride: 98 mmol/L (ref 98–111)
Creatinine, Ser: 7.91 mg/dL — ABNORMAL HIGH (ref 0.61–1.24)
GFR, Estimated: 8 mL/min — ABNORMAL LOW (ref >60)
Glucose, Bld: 129 mg/dL — ABNORMAL HIGH (ref 70–99)
Potassium: 3.7 mmol/L (ref 3.5–5.1)
Sodium: 139 mmol/L (ref 135–145)
Total Bilirubin: 0.6 mg/dL (ref 0.3–1.2)
Total Protein: 4.8 g/dL — ABNORMAL LOW (ref 6.5–8.1)

## 2022-03-01 LAB — CBC
HCT: 37.8 % — ABNORMAL LOW (ref 39.0–52.0)
Hemoglobin: 12.9 g/dL — ABNORMAL LOW (ref 13.0–17.0)
MCH: 31.9 pg (ref 26.0–34.0)
MCHC: 34.1 g/dL (ref 30.0–36.0)
MCV: 93.6 fL (ref 80.0–100.0)
Platelets: 192 10*3/uL (ref 150–400)
RBC: 4.04 MIL/uL — ABNORMAL LOW (ref 4.22–5.81)
RDW: 12.2 % (ref 11.5–15.5)
WBC: 9.6 10*3/uL (ref 4.0–10.5)
nRBC: 0 % (ref 0.0–0.2)

## 2022-03-01 LAB — CK TOTAL AND CKMB (NOT AT ARMC)
CK, MB: 7 ng/mL — ABNORMAL HIGH (ref 0.5–5.0)
Relative Index: 0.1 (ref 0.0–2.5)
Total CK: 10527 U/L — ABNORMAL HIGH (ref 49–397)

## 2022-03-01 LAB — CULTURE, BLOOD (ROUTINE X 2)
Culture: NO GROWTH
Culture: NO GROWTH
Special Requests: ADEQUATE

## 2022-03-01 MED ORDER — SODIUM CHLORIDE 0.9 % IV SOLN: INTRAVENOUS | Status: DC

## 2022-03-01 MED ORDER — METOPROLOL TARTRATE 50 MG PO TABS
100.0000 mg | ORAL_TABLET | Freq: Two times a day (BID) | ORAL | Status: DC
  Administered 2022-03-01 – 2022-03-05 (×8): 100 mg via ORAL
  Filled 2022-03-01 (×8): qty 2

## 2022-03-01 MED ORDER — CLONIDINE HCL 0.1 MG PO TABS
0.1000 mg | ORAL_TABLET | Freq: Three times a day (TID) | ORAL | Status: DC
  Administered 2022-03-01 – 2022-03-03 (×7): 0.1 mg via ORAL
  Filled 2022-03-01 (×7): qty 1

## 2022-03-01 NOTE — Progress Notes (Signed)
PROGRESS NOTE    Franklin Anderson  MPN:[id number] DOB: [birth year] DOA: 02/23/2022 PCP: Cain Saupe, MD   Chief Complaint  Patient presents with   Code Stroke    Brief Narrative:    Franklin Anderson is a 51 y.o. male with medical history significant of polysubstance abuse, essential hypertension , rheumatic fever with subsequent heart murmur who presents to ED BIB EMS after being found down at home in his own feces,after falling out of bed last pm.  Arrival to ED patient was noted to have significant right-sided swelling, pain, CT of right hip was significant for severe muscle inflammation of the hip/gluteal muscle edema, as well he was noted to have acute renal failure, and work-up significant for CVA.  Assessment & Plan:   Principal Problem:   Change in mental status Active Problems:   Uncontrolled hypertension   Polysubstance abuse (HCC)  AKI Severe rhabdomyolysis -Renal input greatly appreciated, total CK improving on IV fluids -Creatinine continues to trend up, but appears to be finally plateauing  -With IV fluids, likely for next 24 hours then can be discontinued given he started to develop edema needed Lasix per renal . -Continue with Foley catheter  -Bicarb Drip has been discontinued as bicarb level is normalized -Management per nephrology  Acute Metabolic  Encephalopathy  -due to polysubstance abuse and acute CVA . -Mental status significantly improved .  Hyperkalemia -Improved,  Hypocalcemia -Repleted, continue to monitor  Hypertension -Blood pressure significantly elevated, (patient is out of permissive HTN period per neuro note) -Significantly elevated, will increase his clonidine and metoprolol (specially he remains tachycardic), continue with amlodipine and as needed hydralazine.      Acute CVA -He denies any IV drug abuse, -Neurology on board, will be requested for TEE per neuro recommendations  Polysubstance abuse - CIWA protocol -Patient denies any  IV drug abuse, reports he just snorted, he is adamant that no IV drug abuse since 2020.    Elevated LFTS: -Most likely due to above, trending down. 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  Marland Kitchen  HCV RNA is elevated, will need to follow-up with ID as an outpatient regarding treatment   Severe muscle inflammation of right hip and gluteal muscle/crush injury -Is most likely due to severe rhabdomyolysis/myositis/ crush injury-most likely in the setting of cocaine use, as well possibly related to laying on the floor for unknown period of time  -No orthopedic surgery recommended at this time per orthopedic .     DVT prophylaxis: [place] heaprin Code Status: Full Family Communication: discussed with his wife per his permission on 11/3 Disposition:   Status is: Inpatient    Consultants:  Neurology Nephrology Orthopedic   Subjective:  Significant events overnight, no significant change in function and left lower extremity pain. Objective: Vitals:   02/28/22 2345 03/01/22 0335 03/01/22 0730 03/01/22 1108  BP: (!) 153/94 (!) 177/109 (!) 187/107 (!) 171/102  Pulse: 83 92 95 89  Resp: (!) 22 20 16 18   Temp: 98.4 F (36.9 C) 99.5 F (37.5 C) 98 F (36.7 C) 98 F (36.7 C)  TempSrc: Oral Axillary    SpO2: 93% 94% 93% 94%  Weight:      Height:        Intake/Output Summary (Last 24 hours) at 03/01/2022 1227 Last data filed at 03/01/2022 1049 Gross per 24 hour  Intake 815 ml  Output 3950 ml  Net -3135 ml   13/08/2021  02/23/22 2046 02/26/22 0500  Weight: 56.7 kg 77.3 kg    Examination:   Awake Alert, Oriented X 3, No new F.N deficits, Normal affect Symmetrical Chest wall movement, Good air movement bilaterally, CTAB RRR,No Gallops,Rubs or new Murmurs, No Parasternal Heave +ve B.Sounds, Abd Soft, No tenderness, No rebound - guarding or rigidity. Lower extremity edema, significant scrotal edema   Foley present   Data Reviewed: I have  personally reviewed following labs and imaging studies  CBC: Recent Labs  Lab 02/23/22 2008 02/23/22 2016 02/25/22 0332 02/26/22 0349 02/27/22 0326 02/28/22 0317 03/01/22 0258  WBC 16.4*   < > 9.8 7.5 6.7 7.4 9.6  NEUTROABS 14.3*  --   --   --   --   --   --   HGB 17.3*   < > 13.4 12.1* 12.1* 13.2 12.9*  HCT 53.1*   < > 38.1* 34.5* 34.1* 38.0* 37.8*  MCV 97.8   < > 92.3 92.5 91.9 93.6 93.6  PLT 349   < > 198 178 182 201 192   < > = values in this interval not displayed.    Basic Metabolic Panel: Recent Labs  Lab 02/24/22 1231 02/24/22 1947 02/24/22 1950 02/25/22 0332 02/26/22 0349 02/27/22 0326 02/28/22 0317 03/01/22 0258  NA 138 136   < > 133* 135 136 139 139  K 5.7* 4.3   < > 3.6 3.7 3.6 3.8 3.7  CL 105 99  --  95* 88* 90* 94* 98  CO2 19* 20*  --  23 31 31 27 26   GLUCOSE 95 156*  --  162* 131* 106* 136* 129*  BUN 71* 75*  --  74* 79* 85* 91* 93*  CREATININE 4.12* 4.77*  --  5.41* 7.07* 8.30* 8.48* 7.91*  CALCIUM 6.8* 6.7*  --  6.4* 6.1* 6.2* 7.5* 7.9*  PHOS 6.6* 6.0*  --   --   --   --   --   --    < > = values in this interval not displayed.    GFR: Estimated Creatinine Clearance: 12.1 mL/min (A) (by C-G formula based on SCr of 7.91 mg/dL (H)).  Liver Function Tests: Recent Labs  Lab 02/25/22 0332 02/26/22 0349 02/27/22 0326 02/28/22 0317 03/01/22 0258  AST 645* 546* 518* 368* 240*  ALT 440* 362* 351* 313* 261*  ALKPHOS 62 55 51 49 52  BILITOT 0.6 0.8 0.8 0.8 0.6  PROT 4.2* 4.1* 4.6* 4.7* 4.8*  ALBUMIN 2.1* 1.9* 1.9* 2.1* 2.2*    CBG: Recent Labs  Lab 02/23/22 2008 02/24/22 0747 02/26/22 0826 02/28/22 0606  GLUCAP 142* 131* 142* 122*     Recent Results (from the past 240 hour(s))  Urine Culture     Status: None   Collection Time: 02/23/22 11:23 PM   Specimen: Urine, Clean Catch  Result Value Ref Range Status   Specimen Description URINE, CLEAN CATCH  Final   Special Requests NONE  Final   Culture   Final    NO GROWTH Performed at  [facility], [address], [place], [place]    Report Status 02/25/2022 FINAL  Final  Culture, blood (Routine X 2) w Reflex to ID Panel     Status: None   Collection Time: 02/24/22  3:38 AM   Specimen: BLOOD LEFT HAND  Result Value Ref Range Status   Specimen Description BLOOD LEFT HAND  Final   Special Requests   Final    BOTTLES DRAWN AEROBIC ONLY  Blood Culture results may not be optimal due to an inadequate volume of blood received in culture bottles   Culture   Final    NO GROWTH 5 DAYS Performed at [facility] [address] [place], [place] [address]    Report Status 03/01/2022 FINAL  Final  Culture, blood (Routine X 2) w Reflex to ID Panel     Status: None   Collection Time: 02/24/22  3:41 AM   Specimen: BLOOD  Result Value Ref Range Status   Specimen Description BLOOD LEFT ANTECUBITAL  Final   Special Requests   Final    BOTTLES DRAWN AEROBIC AND ANAEROBIC Blood Culture adequate volume   Culture   Final    NO GROWTH 5 DAYS Performed at [facility], [place] [address], [place], [place] [address]    Report Status 03/01/2022 FINAL  Final         Radiology Studies: No results found.      Scheduled Meds:  amLODipine  10 mg Oral Daily   aspirin EC  325 mg Oral Daily   calcium carbonate  2 tablet Oral TID   Chlorhexidine Gluconate Cloth  6 each Topical Daily   cloNIDine  0.1 mg Oral TID   heparin  5,000 Units Subcutaneous Q8H   metoprolol tartrate  100 mg Oral BID   Continuous Infusions:  sodium chloride 100 mL/hr at 03/01/22 1049     LOS: 5 days       Phillips Climes, MD [facility]   To contact the attending provider between 7A-7P or the covering provider during after hours 7P-7A, please log into the web site [email or web address] and access using universal [place] password for that web site. If you do not have the password, please call the hospital operator.  03/01/2022, 12:27 PM

## 2022-03-01 NOTE — Progress Notes (Signed)
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  Making urine but may not be enough to get by without dialysis.  I discussed this with the pt and he is in agreement if we need to proceed - continue NS @ 100 mL/ hr- looks like we can continue for at least another 24 hrs - serial BMP checks - Foley - looks like we have avoided dialysis - aggressive K management as below--> now normalized - Cr finally plateauing, got Lasix 80 IV x 1 with almost 5l UOP yesterday, will hold today   2.  Hyperkalemia: rhabdo + losartan + NSAIDs             - calcium, insulin/ dextrose in ED             - s/p bicarb gtt             - s/p albuterol 10 mg neb             - s/p lokelma TID     - on empiric antibiotics with linezolid and zosyn-- now off - ortho c/s, appreciate assistance   3.  Hypocalcemia:             - aggressive repletion  - TUMS 2 TID   - better   4.  HTN             - holding losartan  - amlodipine, clonidine, metop   5.  Polysubstance abuse             - CIWA protocol   6.  Elevated LFTS:             - likely d/t rhabdo, consider RUQ imaging, exam benign, per primary  7.  Acute CVA  - workup per neuro   8.  Dispo: admitted  Subjective:    Cr is improving finally UOP good.     Objective:   BP (!) 187/107 (BP Location: Left Arm)   Pulse 95   Temp 98 F (36.7 C)   Resp 16   Ht 6' (1.829 m)   Wt 77.3 kg   SpO2 93%   BMI 23.11 kg/m   Intake/Output Summary (Last 24 hours) at 03/01/2022 1026 Last data filed at 02/28/2022 2353 Gross per 24 hour  Intake 800 ml  Output 2100 ml  Net -1300 ml   Weight change:   Physical Exam: GEN: awake and alert, sitting in bed HEENT EOMI PERRL, poor dentition NECK some increased JVD today PULM clear CV tachycardic ABD thin EXT no edema NEURO AAO x 3 SKIN dry, ruddy complexion, mult tattooos MSK severe R hip pain, trying to move  Imaging: No results  found.  Labs: BMET Recent Labs  Lab 02/24/22 1231 02/24/22 1947 02/24/22 1950 02/25/22 0332 02/26/22 0349 02/27/22 0326 02/28/22 0317 03/01/22 0258  NA 138 136 134* 133* 135 136 139 139  K 5.7* 4.3 4.2 3.6 3.7 3.6 3.8 3.7  CL 105 99  --  95* 88* 90* 94* 98  CO2 19* 20*  --  23 31 31 27 26   GLUCOSE 95 156*  --  162* 131* 106* 136* 129*  BUN 71* 75*  --  74* 79* 85* 91* 93*  CREATININE 4.12* 4.77*  --  5.41* 7.07* 8.30* 8.48* 7.91*  CALCIUM 6.8* 6.7*  --  6.4* 6.1* 6.2* 7.5* 7.9*  PHOS 6.6* 6.0*  --   --   --   --   --   --  CBC Recent Labs  Lab 02/23/22 2008 02/23/22 2016 02/26/22 0349 02/27/22 0326 02/28/22 0317 03/01/22 0258  WBC 16.4*   < > 7.5 6.7 7.4 9.6  NEUTROABS 14.3*  --   --   --   --   --   HGB 17.3*   < > 12.1* 12.1* 13.2 12.9*  HCT 53.1*   < > 34.5* 34.1* 38.0* 37.8*  MCV 97.8   < > 92.5 91.9 93.6 93.6  PLT 349   < > 178 182 201 192   < > = values in this interval not displayed.    Medications:     amLODipine  10 mg Oral Daily   aspirin EC  325 mg Oral Daily   calcium carbonate  2 tablet Oral TID   Chlorhexidine Gluconate Cloth  6 each Topical Daily   cloNIDine  0.1 mg Oral BID   heparin  5,000 Units Subcutaneous Q8H   metoprolol tartrate  75 mg Oral BID   Bufford Buttner, MD 03/01/2022, 10:26 AM

## 2022-03-01 NOTE — Plan of Care (Signed)

## 2022-03-02 LAB — COMPREHENSIVE METABOLIC PANEL
ALT: 214 U/L — ABNORMAL HIGH (ref 0–44)
AST: 163 U/L — ABNORMAL HIGH (ref 15–41)
Albumin: 2.2 g/dL — ABNORMAL LOW (ref 3.5–5.0)
Alkaline Phosphatase: 46 U/L (ref 38–126)
Anion gap: 14 (ref 5–15)
BUN: 92 mg/dL — ABNORMAL HIGH (ref 6–20)
CO2: 23 mmol/L (ref 22–32)
Calcium: 8 mg/dL — ABNORMAL LOW (ref 8.9–10.3)
Chloride: 100 mmol/L (ref 98–111)
Creatinine, Ser: 6.82 mg/dL — ABNORMAL HIGH (ref 0.61–1.24)
GFR, Estimated: 9 mL/min — ABNORMAL LOW (ref >60)
Glucose, Bld: 125 mg/dL — ABNORMAL HIGH (ref 70–99)
Potassium: 3.7 mmol/L (ref 3.5–5.1)
Sodium: 137 mmol/L (ref 135–145)
Total Bilirubin: 1 mg/dL (ref 0.3–1.2)
Total Protein: 5 g/dL — ABNORMAL LOW (ref 6.5–8.1)

## 2022-03-02 LAB — CBC
HCT: 37.8 % — ABNORMAL LOW (ref 39.0–52.0)
Hemoglobin: 13.3 g/dL (ref 13.0–17.0)
MCH: 32.2 pg (ref 26.0–34.0)
MCHC: 35.2 g/dL (ref 30.0–36.0)
MCV: 91.5 fL (ref 80.0–100.0)
Platelets: 186 10*3/uL (ref 150–400)
RBC: 4.13 MIL/uL — ABNORMAL LOW (ref 4.22–5.81)
RDW: 12.2 % (ref 11.5–15.5)
WBC: 11 10*3/uL — ABNORMAL HIGH (ref 4.0–10.5)
nRBC: 0 % (ref 0.0–0.2)

## 2022-03-02 LAB — CK: Total CK: 6954 U/L — ABNORMAL HIGH (ref 49–397)

## 2022-03-02 MED ORDER — MEDIHONEY WOUND/BURN DRESSING EX PSTE
1.0000 | PASTE | Freq: Every day | CUTANEOUS | Status: DC
  Administered 2022-03-02 – 2022-03-05 (×4): 1 via TOPICAL
  Filled 2022-03-02: qty 44

## 2022-03-02 MED ORDER — CALCIUM CARBONATE ANTACID 500 MG PO CHEW
2.0000 | CHEWABLE_TABLET | Freq: Two times a day (BID) | ORAL | Status: DC
  Administered 2022-03-02 – 2022-03-04 (×5): 400 mg via ORAL
  Administered 2022-03-04: 2 via ORAL
  Administered 2022-03-05 (×2): 400 mg via ORAL
  Filled 2022-03-02 (×8): qty 2

## 2022-03-02 NOTE — Progress Notes (Signed)
Occupational Therapy Treatment Patient Details Name: Franklin Anderson MRN: [id number] DOB: April 03, 1971 Today's Date: 03/02/2022   History of present illness Franklin Anderson is a 51 y.o. 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  Patient was noted to have significant right-sided swelling, pain, CT of right hip was significant for severe muscle inflammation of the hip/gluteal muscle edema, as well he was noted to have acute renal failure, and work-up significant for CVA.   OT comments  Patient continues to make steady progress towards goals in skilled OT session. Patient's session encompassed co-treat with PT to advance gait and promote overall independence. Patients main limitation is significant pain in R hip and scrotum. Scrotal sling soiled upon arrival therefore OT washing and hanging dry with education provided to patient with regard to elevation and decreasing swelling during the day. Patient mod A of 2 to come into standing and advance gait, with improved weight bearing on R leg. OT continues to advocate for AIR placement to promote return to previous level of independence. OT will follow acutely.    Recommendations for follow up therapy are one component of a multi-disciplinary discharge planning process, led by the attending physician.  Recommendations may be updated based on patient status, additional functional criteria and insurance authorization.    Follow Up Recommendations  Acute inpatient rehab (3hours/day)    Assistance Recommended at Discharge Frequent or constant Supervision/Assistance  Patient can return home with the following  Help with stairs or ramp for entrance;Assist for transportation;Assistance with  cooking/housework;A lot of help with bathing/dressing/bathroom;A lot of help with walking and/or transfers   Equipment Recommendations  Other (comment) (defer to next venue)    Recommendations for Other Services Rehab consult    Precautions / Restrictions Precautions Precautions: Fall Precaution Comments: scrotal edema Required Braces or Orthoses: Other Brace Other Brace: OT made scrotal sling Restrictions Weight Bearing Restrictions: No       Mobility Bed Mobility Overal bed mobility: Needs Assistance Bed Mobility: Rolling, Sidelying to Sit Rolling: Min assist Sidelying to sit: Mod assist       General bed mobility comments: Min assist to fully roll to Rt side for linen clean up due to BM. Mod assist to manage Rt LE off EOB for pain management and use of bed pad to scoot anteriorly to place feet on floor. pt able to raise trunk using bed rail.    Transfers Overall transfer level: Needs assistance Equipment used: Rolling walker (2 wheels) Transfers: Sit to/from Stand Sit to Stand: Mod assist, +2 safety/equipment, From elevated surface           General transfer comment: Mod +2 for safety due to pain. EOB significantly elevated for power up. Mod Assist to control lowering with tactile for hand placement at recliner. in recliner pt repositioned with Rt LE and scrotum elevated for edema relief.     Balance Overall balance assessment: Needs assistance Sitting-balance support: Feet supported Sitting balance-Leahy Scale: Fair     Standing balance support: Reliant on assistive device for balance, Bilateral upper extremity supported Standing balance-Leahy Scale: Poor Standing balance comment: UE support due to pain  ADL either performed or assessed with clinical judgement   ADL Overall ADL's : Needs assistance/impaired     Grooming: Min guard;Sitting;Wash/dry face;Wash/dry hands       Lower Body Bathing: Maximal assistance;+2  for physical assistance;+2 for safety/equipment;Sit to/from stand           Toilet Transfer: Moderate assistance;+2 for physical assistance;+2 for safety/equipment;Rolling walker (2 wheels)   Toileting- Clothing Manipulation and Hygiene: Total assistance;Bed level Toileting - Clothing Manipulation Details (indicate cue type and reason): patient with BM at beginning of session     Functional mobility during ADLs: Moderate assistance;+2 for physical assistance;+2 for safety/equipment;Rolling walker (2 wheels) General ADL Comments: Session focus on advancing gait and education on scrotal edema    Extremity/Trunk Assessment              Vision       Perception     Praxis      Cognition Arousal/Alertness: Awake/alert Behavior During Therapy: WFL for tasks assessed/performed                                   General Comments: Following all commands, increased time but due to pain. very willing to participate but pain is limiting.        Exercises      Shoulder Instructions       General Comments      Pertinent Vitals/ Pain       Pain Assessment Pain Assessment: Faces Faces Pain Scale: Hurts worst Pain Location: Rt hip and scrotum Pain Descriptors / Indicators: Sharp, Aching, Grimacing, Guarding Pain Intervention(s): Limited activity within patient's tolerance, Monitored during session, Premedicated before session, Patient requesting pain meds-RN notified  Home Living                                          Prior Functioning/Environment              Frequency  Min 2X/week        Progress Toward Goals  OT Goals(current goals can now be found in the care plan section)  Progress towards OT goals: Progressing toward goals  Acute Rehab OT Goals Patient Stated Goal: no pain OT Goal Formulation: With patient Time For Goal Achievement: 03/12/22 Potential to Achieve Goals: Good ADL Goals Pt Will Perform Lower Body  Bathing: Independently;sit to/from stand;sitting/lateral leans Pt Will Perform Lower Body Dressing: Independently;sit to/from stand;sitting/lateral leans Pt Will Transfer to Toilet: Independently;ambulating Pt Will Perform Toileting - Clothing Manipulation and hygiene: Independently Additional ADL Goal #1: Patient will be able to verbalize steps to aid in decreased scrotal edema to promote increased independence in ADLs. Additional ADL Goal #2: Patient will be able to complete functional task in standing for 3-5 minutes to promote increased activity tolerance to return to prior level of function.  Plan Discharge plan remains appropriate    Co-evaluation      Reason for Co-Treatment: For patient/therapist safety;To address functional/ADL transfers PT goals addressed during session: Mobility/safety with mobility;Balance;Proper use of DME OT goals addressed during session: ADL's and self-care      AM-PAC OT "6 Clicks" Daily Activity     Outcome Measure   Help from another person eating meals?: None Help from another person taking care of personal grooming?: A Little Help from another person toileting, which includes using  toliet, bedpan, or urinal?: A Lot Help from another person bathing (including washing, rinsing, drying)?: A Lot Help from another person to put on and taking off regular upper body clothing?: A Little Help from another person to put on and taking off regular lower body clothing?: A Lot 6 Click Score: 16    End of Session Equipment Utilized During Treatment: Gait belt;Rolling walker (2 wheels);Other (comment) (OT washing scrotal sling)  OT Visit Diagnosis: Unsteadiness on feet (R26.81);Muscle weakness (generalized) (M62.81);Other abnormalities of gait and mobility (R26.89);Pain Pain - Right/Left: Right Pain - part of body: Leg;Hip   Activity Tolerance Patient tolerated treatment well;Patient limited by pain   Patient Left in chair;with call bell/phone within  reach   Nurse Communication Mobility status        Time: 1132-1205 OT Time Calculation (min): 33 min  Charges: OT General Charges $OT Visit: 1 Visit OT Treatments $Self Care/Home Management : 8-22 mins  Pollyann Glen E. [place] Rogacki, OTR/L Acute Rehabilitation Services [phone]   Cherlyn Cushing 03/02/2022, 3:30 PM

## 2022-03-02 NOTE — Progress Notes (Signed)
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  Making urine but may not be enough to get by without dialysis.  I discussed this with the pt and he is in agreement if we need to proceed - serial BMP checks - Foley - looks like we have avoided dialysis - aggressive K management as below--> now normalized - Cr finally plateauing and improving each day, last Lasix 80 IV on 11/3, still great UOP. Would continue holding the Lasix. Right leg certainly > Left LE + scrotal edema. Scrotal sling to see if it will help 2.  Hyperkalemia: rhabdo + losartan + NSAIDs             - calcium, insulin/ dextrose in ED             - s/p bicarb gtt             - s/p albuterol 10 mg neb             - s/p lokelma TID (no longer on lokelma)     - on empiric antibiotics with linezolid and zosyn-- now off - ortho c/s, appreciate assistance   3.  Hypocalcemia:             - aggressive repletion  - TUMS 2 TID   - better   4.  HTN             - holding losartan  - amlodipine, clonidine, metop   5.  Polysubstance abuse             - CIWA protocol   6.  Elevated LFTS:             - likely d/t rhabdo, consider RUQ imaging, exam benign, per primary  7.  Acute CVA  - workup per neuro   8.  Dispo: admitted  Subjective:    Cr is improving finally UOP good; main complaint is right hip pain. Some nausea but denies SOB.     Objective:   BP (!) 178/100 (BP Location: Right Arm) Comment: RN NJotified  Pulse 93   Temp 97.6 F (36.4 C) (Oral)   Resp 20   Ht 6' (1.829 m)   Wt 76.1 kg   SpO2 95%   BMI 22.75 kg/m   Intake/Output Summary (Last 24 hours) at 03/02/2022 0847 Last data filed at 03/01/2022 2229 Gross per 24 hour  Intake 255 ml  Output 4050 ml  Net -3795 ml   Weight change:   Physical Exam: GEN: awake and alert, sitting in bed HEENT EOMI PERRL, poor dentition NECK some increased JVD today PULM clear CV RRR ABD thin EXT  right lower ext edema, scrotal edema NEURO AAO x 3 SKIN dry, ruddy complexion, mult tattooos MSK severe R hip pain, trying to move  Imaging: No results found.  Labs: BMET Recent Labs  Lab 02/24/22 1231 02/24/22 1947 02/24/22 1950 02/25/22 0332 02/26/22 0349 02/27/22 0326 02/28/22 0317 03/01/22 0258 03/02/22 0328  NA 138 136 134* 133* 135 136 139 139 137  K 5.7* 4.3 4.2 3.6 3.7 3.6 3.8 3.7 3.7  CL 105 99  --  95* 88* 90* 94* 98 100  CO2 19* 20*  --  23 31 31 27 26 23   GLUCOSE 95 156*  --  162* 131* 106* 136* 129* 125*  BUN 71* 75*  --  74* 79* 85* 91* 93* 92*  CREATININE 4.12* 4.77*  --  5.41* 7.07* 8.30* 8.48* 7.91* 6.82*  CALCIUM 6.8* 6.7*  --  6.4* 6.1* 6.2* 7.5* 7.9* 8.0*  PHOS 6.6* 6.0*  --   --   --   --   --   --   --    CBC Recent Labs  Lab 02/23/22 2008 02/23/22 2016 02/27/22 0326 02/28/22 0317 03/01/22 0258 03/02/22 0328  WBC 16.4*   < > 6.7 7.4 9.6 11.0*  NEUTROABS 14.3*  --   --   --   --   --   HGB 17.3*   < > 12.1* 13.2 12.9* 13.3  HCT 53.1*   < > 34.1* 38.0* 37.8* 37.8*  MCV 97.8   < > 91.9 93.6 93.6 91.5  PLT 349   < > 182 201 192 186   < > = values in this interval not displayed.    Medications:     amLODipine  10 mg Oral Daily   aspirin EC  325 mg Oral Daily   calcium carbonate  2 tablet Oral BID WC   Chlorhexidine Gluconate Cloth  6 each Topical Daily   cloNIDine  0.1 mg Oral TID   heparin  5,000 Units Subcutaneous Q8H   metoprolol tartrate  100 mg Oral BID   Bufford Buttner, MD 03/02/2022, 8:47 AM

## 2022-03-02 NOTE — Progress Notes (Signed)
PROGRESS NOTE    Franklin Anderson  XLK:[id number] DOB: 03-15-1971 DOA: 02/23/2022 PCP: Antony Blackbird, MD   Chief Complaint  Patient presents with   Code Stroke    Brief Narrative:    Franklin Anderson is a 51 y.o. male with medical history significant of polysubstance abuse, essential hypertension , rheumatic fever with subsequent heart murmur who presents to ED BIB EMS after being found down at home in his own feces,after falling out of bed last pm.  Arrival to ED patient was noted to have significant right-sided swelling, pain, CT of right hip was significant for severe muscle inflammation of the hip/gluteal muscle edema, as well he was noted to have acute renal failure, and work-up significant for CVA.  Assessment & Plan:   Principal Problem:   Change in mental status Active Problems:   Uncontrolled hypertension   Polysubstance abuse ([place])  AKI Severe rhabdomyolysis -Renal input greatly appreciated, total CK improving on IV fluids -Creatinine trending up initially, currently plateauing and trending down minimally -Continue with Foley catheter  -Bicarb Drip has been discontinued as bicarb level is normalized -Management per nephrology  Acute Metabolic  Encephalopathy  -due to polysubstance abuse and acute CVA . -Mental status significantly improved .  Back to baseline  Hyperkalemia -Improved,  Hypocalcemia -Repleted, continue to monitor, will decrease supplements to twice daily  Hypertension -Blood pressure significantly elevated, (patient is out of permissive HTN period per neuro note) -Improved after increasing his clonidine and metoprolol, continue with amlodipine and as needed hydralazine, if remains on the higher side over the next 24 hours then we will add oral hydralazine as well.     Acute CVA -He denies any IV drug abuse, -By neurology, recommendation for TEE, scheduled for Wednesday.  Polysubstance abuse - CIWA protocol -Patient denies any IV drug abuse,  reports he just snorted, he is adamant that no IV drug abuse since 2020.    Elevated LFTS: -Most likely due to above, trending down. 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  Marland Kitchen  HCV RNA is elevated, ID to schedule an appointment in 1 month.  I have discussed with his wife as well after I obtained his permission, and notified her of above finding and she will follow with her PCP to get tested as well.     Severe muscle inflammation of right hip and gluteal muscle/crush injury -Is most likely due to severe rhabdomyolysis/myositis/ crush injury-most likely in the setting of cocaine use, as well possibly related to laying on the floor for unknown period of time  -No orthopedic surgery recommended at this time per orthopedic .     DVT prophylaxis: [place] heaprin Code Status: Full Family Communication: discussed with his wife per his permission on 11/3, 11/6 Disposition:   Status is: Inpatient    Consultants:  Neurology Nephrology Orthopedic   Subjective:  No significant events overnight Objective: Vitals:   03/02/22 0334 03/02/22 0500 03/02/22 0813 03/02/22 1234  BP: (!) 170/95  (!) 178/100 (!) 155/99  Pulse: 91  93 87  Resp: 14  20 20   Temp: 97.9 F (36.6 C)  97.6 F (36.4 C) 98.9 F (37.2 C)  TempSrc:   Oral Oral  SpO2: 95%  95% 97%  Weight:  76.1 kg    Height:        Intake/Output Summary (Last 24 hours) at 03/02/2022 1244 Last data filed at 03/02/2022 1030 Gross per 24 hour  Intake 357  ml  Output 2200 ml  Net -1843 ml   Filed Weights   02/23/22 2046 02/26/22 0500 03/02/22 0500  Weight: 56.7 kg 77.3 kg 76.1 kg    Examination:   Awake Alert, Oriented X 3, frail, appears older than stated age Symmetrical Chest wall movement, Good air movement bilaterally, CTAB RRR,No Gallops,Rubs or new Murmurs, No Parasternal Heave +ve B.Sounds, Abd Soft, No tenderness, No rebound - guarding or rigidity. No Cyanosis, right lower extremity  swelling, started to develop bruising on the lateral right lower extremity. Foley present   Data Reviewed: I have personally reviewed following labs and imaging studies  CBC: Recent Labs  Lab 02/23/22 2008 02/23/22 2016 02/26/22 0349 02/27/22 0326 02/28/22 0317 03/01/22 0258 03/02/22 0328  WBC 16.4*   < > 7.5 6.7 7.4 9.6 11.0*  NEUTROABS 14.3*  --   --   --   --   --   --   HGB 17.3*   < > 12.1* 12.1* 13.2 12.9* 13.3  HCT 53.1*   < > 34.5* 34.1* 38.0* 37.8* 37.8*  MCV 97.8   < > 92.5 91.9 93.6 93.6 91.5  PLT 349   < > 178 182 201 192 186   < > = values in this interval not displayed.    Basic Metabolic Panel: Recent Labs  Lab 02/24/22 1231 02/24/22 1947 02/24/22 1950 02/26/22 0349 02/27/22 0326 02/28/22 0317 03/01/22 0258 03/02/22 0328  NA 138 136   < > 135 136 139 139 137  K 5.7* 4.3   < > 3.7 3.6 3.8 3.7 3.7  CL 105 99   < > 88* 90* 94* 98 100  CO2 19* 20*   < > 31 31 27 26 23   GLUCOSE 95 156*   < > 131* 106* 136* 129* 125*  BUN 71* 75*   < > 79* 85* 91* 93* 92*  CREATININE 4.12* 4.77*   < > 7.07* 8.30* 8.48* 7.91* 6.82*  CALCIUM 6.8* 6.7*   < > 6.1* 6.2* 7.5* 7.9* 8.0*  PHOS 6.6* 6.0*  --   --   --   --   --   --    < > = values in this interval not displayed.    GFR: Estimated Creatinine Clearance: 13.8 mL/min (A) (by C-G formula based on SCr of 6.82 mg/dL (H)).  Liver Function Tests: Recent Labs  Lab 02/26/22 0349 02/27/22 0326 02/28/22 0317 03/01/22 0258 03/02/22 0328  AST 546* 518* 368* 240* 163*  ALT 362* 351* 313* 261* 214*  ALKPHOS 55 51 49 52 46  BILITOT 0.8 0.8 0.8 0.6 1.0  PROT 4.1* 4.6* 4.7* 4.8* 5.0*  ALBUMIN 1.9* 1.9* 2.1* 2.2* 2.2*    CBG: Recent Labs  Lab 02/23/22 2008 02/24/22 0747 02/26/22 0826 02/28/22 0606  GLUCAP 142* 131* 142* 122*     Recent Results (from the past 240 hour(s))  Urine Culture     Status: None   Collection Time: 02/23/22 11:23 PM   Specimen: Urine, Clean Catch  Result Value Ref Range Status    Specimen Description URINE, CLEAN CATCH  Final   Special Requests NONE  Final   Culture   Final    NO GROWTH Performed at [facility], [address], [place], [place]    Report Status 02/25/2022 FINAL  Final  Culture, blood (Routine X 2) w Reflex to ID Panel     Status: None   Collection Time: 02/24/22  3:38 AM  Specimen: BLOOD LEFT HAND  Result Value Ref Range Status   Specimen Description BLOOD LEFT HAND  Final   Special Requests   Final    BOTTLES DRAWN AEROBIC ONLY Blood Culture results may not be optimal due to an inadequate volume of blood received in culture bottles   Culture   Final    NO GROWTH 5 DAYS Performed at [facility], [address], [place], [place] [address]    Report Status 03/01/2022 FINAL  Final  Culture, blood (Routine X 2) w Reflex to ID Panel     Status: None   Collection Time: 02/24/22  3:41 AM   Specimen: BLOOD  Result Value Ref Range Status   Specimen Description BLOOD LEFT ANTECUBITAL  Final   Special Requests   Final    BOTTLES DRAWN AEROBIC AND ANAEROBIC Blood Culture adequate volume   Culture   Final    NO GROWTH 5 DAYS Performed at [facility], [address], [place], [place] [address]    Report Status 03/01/2022 FINAL  Final         Radiology Studies: No results found.      Scheduled Meds:  amLODipine  10 mg Oral Daily   aspirin EC  325 mg Oral Daily   calcium carbonate  2 tablet Oral BID WC   Chlorhexidine Gluconate Cloth  6 each Topical Daily   cloNIDine  0.1 mg Oral TID   heparin  5,000 Units Subcutaneous Q8H   metoprolol tartrate  100 mg Oral BID   Continuous Infusions:  sodium chloride 100 mL/hr at 03/02/22 0952     LOS: 6 days       Huey Bienenstock, MD [facility]   To contact the attending provider between 7A-7P or the covering provider during after hours 7P-7A, please log into the web site [email or web address] and access using universal [place] password for that web  site. If you do not have the password, please call the hospital operator.  03/02/2022, 12:44 PM

## 2022-03-02 NOTE — Consult Note (Addendum)
[place] Nurse Consult Note: Reason for Consult: Consult requested for right leg wound.  Pt was found down for an extended period of time prior to admission.  Wound type: Right outer calf with previous bruise evolving into full thickness tissue loss. 4X3cm, 50% red, 50% yellow slough.  No odor, drainage, or fluctuance.  Dressing procedure/placement/frequency: Topical treatment orders provided for bedside nurses to perform as follows to assist with removal of nonviable tissue: Apply Medihoney to right leg wound Q day, then cover with foam dressing.  (Change foam dressing Q 3 days or PRN soiling.) Please re-consult if further assistance is needed.  Thank-you,  Julien Girt MSN, [place], [place], [place], [place]

## 2022-03-02 NOTE — Progress Notes (Signed)
Physical Therapy Treatment Patient Details Name: Franklin Anderson MRN: [id number] DOB: [birth year] Today's Date: 03/02/2022   History of Present Illness Franklin Anderson is a 51 y.o. 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  Patient was noted to have significant right-sided swelling, pain, CT of right hip was significant for severe muscle inflammation of the hip/gluteal muscle edema, as well he was noted to have acute renal failure, and work-up significant for CVA.    PT Comments    Patient received in bed requesting assistance for BM clean up. Min assist required to roll and total assist for peri-care due to pain limitations of Rt LE and scrotum. Foley bag noted to be full and RN arrived to room, assisted with emptying. Pt required Mod assist to pivot to EOB with use of bed pad and support of Rt LE to manage pain. Mod+2 to stand with RW, pt preferring NWB on Rt LE due to pain with short bout of gait around bed to recliner. EOS pt repositioned in recliner with Rt LE and scrotum elevated for comfort. Will continue to progress as able in acute setting, continue to recommend intense rehab at [facility] setting.    Recommendations for follow up therapy are one component of a multi-disciplinary discharge planning process, led by the attending physician.  Recommendations may be updated based on patient status, additional functional criteria and insurance authorization.  Follow Up Recommendations  Acute inpatient rehab (3hours/day)     Assistance Recommended at Discharge Intermittent Supervision/Assistance  Patient can return home with the following Two people to help with walking and/or transfers;Two people to help with  bathing/dressing/bathroom   Equipment Recommendations  BSC/3in1;Rolling walker (2 wheels);Wheelchair (measurements PT)    Recommendations for Other Services Rehab consult     Precautions / Restrictions Precautions Precautions: Fall Precaution Comments: scrotal edema Required Braces or Orthoses: Other Brace Other Brace: OT made scrotal sling Restrictions Weight Bearing Restrictions: No     Mobility  Bed Mobility Overal bed mobility: Needs Assistance Bed Mobility: Rolling, Sidelying to Sit Rolling: Min assist Sidelying to sit: Mod assist       General bed mobility comments: Min assist to fully roll to Rt side for linen clean up due to BM. Mod assist to manage Rt LE off EOB for pain management and use of bed pad to scoot anteriorly to place feet on floor. pt able to raise trunk using bed rail.    Transfers Overall transfer level: Needs assistance Equipment used: Rolling walker (2 wheels) Transfers: Sit to/from Stand Sit to Stand: Mod assist, +2 safety/equipment, From elevated surface           General transfer comment: Mod +2 for safety due to pain. EOB significantly elevated for power up. Mod Assist to control lowering with tactile for hand placement at recliner. in recliner pt repositioned with Rt LE and scrotum elevated for edema relief.    Ambulation/Gait Ambulation/Gait assistance: Mod assist, +2 safety/equipment Gait Distance (Feet): 8 Feet Assistive device: Rolling walker (2 wheels) Gait Pattern/deviations: Step-to pattern, Decreased weight shift to right, Decreased stride length, Decreased stance time - right, Antalgic, Trunk flexed, Trendelenburg, Wide base of support Gait velocity: decr     General Gait Details: Mod assist to guide walker with cues for step pattern.  Pt avoiding WB on Rt LE and hop through gait completed >75% of time with toe touch <25% gait. Pt heavily reliant on UE support with RW and bil UE's shaking and fatiguing after ~7' of ambulation.  pt able to turn to sit in recliner.   Stairs             Wheelchair Mobility    Modified Rankin (Stroke Patients Only)       Balance Overall balance assessment: Needs assistance Sitting-balance support: Feet supported Sitting balance-Leahy Scale: Fair     Standing balance support: Reliant on assistive device for balance, Bilateral upper extremity supported Standing balance-Leahy Scale: Poor Standing balance comment: UE support due to pain                            Cognition Arousal/Alertness: Awake/alert Behavior During Therapy: WFL for tasks assessed/performed Overall Cognitive Status: Within Functional Limits for tasks assessed                                 General Comments: Following all commands, increased time but due to pain. very willing to participate but pain is limiting.        Exercises      General Comments        Pertinent Vitals/Pain Pain Assessment Pain Assessment: Faces Faces Pain Scale: Hurts worst Pain Location: Rt hip and scrotum Pain Descriptors / Indicators: Sharp, Aching, Grimacing, Guarding Pain Intervention(s): Limited activity within patient's tolerance, Monitored during session, Repositioned, Patient requesting pain meds-RN notified    Home Living                          Prior Function            PT Goals (current goals can now be found in the care plan section) Acute Rehab PT Goals Patient Stated Goal: to return to independent PT Goal Formulation: With patient Time For Goal Achievement: 03/12/22 Potential to Achieve Goals: Good Progress towards PT goals: Progressing toward goals    Frequency    Min 4X/week      PT Plan Current plan remains appropriate    Co-evaluation PT/OT/SLP Co-Evaluation/Treatment: Yes Reason for Co-Treatment: For patient/therapist safety;To address functional/ADL transfers PT goals addressed during session: Mobility/safety with  mobility;Balance;Proper use of DME OT goals addressed during session: ADL's and self-care      AM-PAC PT "6 Clicks" Mobility   Outcome Measure  Help needed turning from your back to your side while in a flat bed without using bedrails?: A Little Help needed moving from lying on your back to sitting on the side of a flat bed without using bedrails?: A Lot Help needed moving to and from a bed to a chair (including a wheelchair)?: A Lot Help needed standing up from a chair using your arms (e.g., wheelchair or bedside chair)?: A Lot Help needed to walk in hospital room?: A Lot Help needed climbing 3-5 steps with a railing? : Total 6 Click Score: 12    End of Session Equipment Utilized During Treatment: Gait belt Activity Tolerance: Patient limited by pain Patient left: with call bell/phone within reach;in chair;with chair alarm set Nurse Communication: Mobility status PT Visit Diagnosis: Difficulty in walking, not elsewhere classified (R26.2);Pain;History of falling (Z91.81) Pain - Right/Left: Right Pain - part of body: Hip     Time: 1131-1205  PT Time Calculation (min) (ACUTE ONLY): 34 min  Charges:  $Gait Training: 8-22 mins                     Verner Mould, DPT Acute Rehabilitation Services Office [phone]  03/02/22 1:55 PM

## 2022-03-02 NOTE — Progress Notes (Signed)
Mobility Specialist Progress Note   03/02/22 1500  Mobility  Activity Transferred from chair to bed  Level of Assistance Moderate assist, patient does 50-74%  Assistive Device Front wheel walker  Range of Motion/Exercises Passive;Right leg  Activity Response Tolerated fair   Patient received in recliner requesting assistance back to bed. Was having 10/10 pain in scrotum and RLE which limited strength. Completed education on relaxation breathing techniques to improve the onset of pain. Stood impulsively requiring mod A + cues to breath. Was able to pivot back to bed with heavy mod A. Tolerated without incident. Was left in bed with all needs met, call bell in reach.   [place] Tonishia Steffy, [place], [employer]  Office: [phone]

## 2022-03-02 NOTE — TOC Progression Note (Signed)
Transition of Care [facility]) - Progression Note    Patient Details  Name: Franklin Anderson MRN: [id number] Date of Birth: 25-Feb-1971  Transition of [facility]) CM/SW Contact  Pollie Friar, RN Phone Number: 03/02/2022, 1:43 PM  Clinical Narrative:    Plan is for TEE. CIR following for admission when medically ready.  TOC following.   Expected Discharge Plan: Home/Self Care Barriers to Discharge: Continued Medical Work up  Expected Discharge Plan and Services Expected Discharge Plan: Home/Self Care       Living arrangements for the past 2 months: Single Family Home                                       Social Determinants of Health (SDOH) Interventions Utilities Interventions: Inpatient TOC Financial Strain Interventions: Patient Refused (pt denies)  Readmission Risk Interventions     No data to display

## 2022-03-02 NOTE — Progress Notes (Signed)
Inpatient Rehab Admissions Coordinator:  Await medical clearance and bed availability. Will continue to follow for medical work up and progress with therapies.   Gayland Curry, [place], [place] Admissions Coordinator [phone]

## 2022-03-03 LAB — COMPREHENSIVE METABOLIC PANEL
ALT: 172 U/L — ABNORMAL HIGH (ref 0–44)
AST: 117 U/L — ABNORMAL HIGH (ref 15–41)
Albumin: 2.3 g/dL — ABNORMAL LOW (ref 3.5–5.0)
Alkaline Phosphatase: 42 U/L (ref 38–126)
Anion gap: 13 (ref 5–15)
BUN: 90 mg/dL — ABNORMAL HIGH (ref 6–20)
CO2: 21 mmol/L — ABNORMAL LOW (ref 22–32)
Calcium: 8.2 mg/dL — ABNORMAL LOW (ref 8.9–10.3)
Chloride: 103 mmol/L (ref 98–111)
Creatinine, Ser: 5.27 mg/dL — ABNORMAL HIGH (ref 0.61–1.24)
GFR, Estimated: 12 mL/min — ABNORMAL LOW (ref >60)
Glucose, Bld: 132 mg/dL — ABNORMAL HIGH (ref 70–99)
Potassium: 3.5 mmol/L (ref 3.5–5.1)
Sodium: 137 mmol/L (ref 135–145)
Total Bilirubin: 0.9 mg/dL (ref 0.3–1.2)
Total Protein: 5 g/dL — ABNORMAL LOW (ref 6.5–8.1)

## 2022-03-03 LAB — CBC
HCT: 35.4 % — ABNORMAL LOW (ref 39.0–52.0)
Hemoglobin: 12.7 g/dL — ABNORMAL LOW (ref 13.0–17.0)
MCH: 32.9 pg (ref 26.0–34.0)
MCHC: 35.9 g/dL (ref 30.0–36.0)
MCV: 91.7 fL (ref 80.0–100.0)
Platelets: 187 10*3/uL (ref 150–400)
RBC: 3.86 MIL/uL — ABNORMAL LOW (ref 4.22–5.81)
RDW: 12.7 % (ref 11.5–15.5)
WBC: 10.1 10*3/uL (ref 4.0–10.5)
nRBC: 0 % (ref 0.0–0.2)

## 2022-03-03 LAB — CK: Total CK: 4170 U/L — ABNORMAL HIGH (ref 49–397)

## 2022-03-03 MED ORDER — FUROSEMIDE 10 MG/ML IJ SOLN
80.0000 mg | Freq: Once | INTRAMUSCULAR | Status: AC
  Administered 2022-03-03: 80 mg via INTRAVENOUS
  Filled 2022-03-03: qty 8

## 2022-03-03 MED ORDER — CLONIDINE HCL 0.1 MG PO TABS
0.2000 mg | ORAL_TABLET | Freq: Two times a day (BID) | ORAL | Status: DC
  Administered 2022-03-03 – 2022-03-05 (×4): 0.2 mg via ORAL
  Filled 2022-03-03 (×4): qty 2

## 2022-03-03 MED ORDER — NICOTINE 7 MG/24HR TD PT24
7.0000 mg | MEDICATED_PATCH | Freq: Once | TRANSDERMAL | Status: AC
  Administered 2022-03-04: 7 mg via TRANSDERMAL
  Filled 2022-03-03: qty 1

## 2022-03-03 MED ORDER — ALBUTEROL SULFATE (2.5 MG/3ML) 0.083% IN NEBU
2.5000 mg | INHALATION_SOLUTION | Freq: Four times a day (QID) | RESPIRATORY_TRACT | Status: DC | PRN
  Administered 2022-03-03: 2.5 mg via RESPIRATORY_TRACT
  Filled 2022-03-03: qty 3

## 2022-03-03 NOTE — H&P (View-Only) (Signed)
PROGRESS NOTE    Franklin Anderson  YOV:[id number] DOB: Oct 05, 1970 DOA: 02/23/2022 PCP: Cain Saupe, MD   Chief Complaint  Patient presents with   Code Stroke    Brief Narrative:    Franklin Anderson is a 51 y.o. male with medical history significant of polysubstance abuse, essential hypertension , rheumatic fever with subsequent heart murmur who presents to ED BIB EMS after being found down at home in his own feces,after falling out of bed last pm.  Arrival to ED patient was noted to have significant right-sided swelling, pain, CT of right hip was significant for severe muscle inflammation of the hip/gluteal muscle edema, as well he was noted to have acute renal failure, and work-up significant for CVA.  Does endorse polysubstance abuse, he denies IV injection, reports snorting.    Assessment & Plan:   Principal Problem:   Change in mental status Active Problems:   Uncontrolled hypertension   Polysubstance abuse (HCC)  AKI Severe rhabdomyolysis -Renal input greatly appreciated, total CK improving on IV fluids -Creatinine trending up initially, currently plateauing and trending down minimally -Continue with Foley catheter  -Bicarb Drip has been discontinued as bicarb level is normalized -Management per nephrology, fluids, will receive IV Lasix today.  Acute Metabolic  Encephalopathy  -due to polysubstance abuse and acute CVA . -Mental status significantly improved .  Back to baseline  Hyperkalemia -Resolved  Hypocalcemia -Repleted, continue to monitor, will decrease supplements to twice daily  Hypertension -Blood pressure significantly elevated, (patient is out of permissive HTN period per neuro note) -Improved after increasing his clonidine and metoprolol, continue with amlodipine and as needed hydralazine, is elevated, will increase his clonidine dosing today.     Acute CVA -He denies any IV drug abuse, -By neurology, recommendation for TEE, scheduled for  Wednesday.  Polysubstance abuse - CIWA protocol -Patient denies any IV drug abuse, reports he just snorted, he is adamant that no IV drug abuse since 2020.    Elevated LFTS: -Most likely due to above, trending down. 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  Marland Kitchen  HCV RNA is elevated, ID to schedule an appointment in 1 month.  I have discussed with his wife as well after I obtained his permission, and notified her of above finding and she will follow with her PCP to get tested as well.     Severe muscle inflammation of right hip and gluteal muscle/crush injury -Is most likely due to severe rhabdomyolysis/myositis/ crush injury-most likely in the setting of cocaine use, as well possibly related to laying on the floor for unknown period of time  -No orthopedic surgery recommended at this time per orthopedic . -Right lower extremity movement started to improve, upon presentation,he  could not move his right leg at all   Right outer calf pressure ulcer -Initially bruise on admission, progressed to full-thickness pressure ulcer -Wound care consulted   DVT prophylaxis: [place] heaprin Code Status: Full Family Communication: discussed with his wife per his permission on 11/3, 11/6 Disposition:   Status is: Inpatient    Consultants:  Neurology Nephrology Orthopedic   Subjective:  No significant events overnight, still reports pain at right lower extremity but has improved Objective: Vitals:   03/03/22 0500 03/03/22 0807 03/03/22 0811 03/03/22 1207  BP:  (!) 162/92 (!) 162/92 (!) 146/90  Pulse:  92 92 83  Resp:   16 17  Temp:   98.4 F (36.9 C) 98 F (36.7  C)  TempSrc:   Oral Oral  SpO2:   95% 98%  Weight: 73.9 kg     Height:        Intake/Output Summary (Last 24 hours) at 03/03/2022 1549 Last data filed at 03/03/2022 1214 Gross per 24 hour  Intake 1240 ml  Output 6175 ml  Net -4935 ml   Filed Weights   02/26/22 0500 03/02/22 0500 03/03/22  0500  Weight: 77.3 kg 76.1 kg 73.9 kg    Examination:   Awake Alert, Oriented X 3, No new F.N deficits, Normal affect Symmetrical Chest wall movement, Good air movement bilaterally, CTAB RRR,No Gallops,Rubs or new Murmurs, No Parasternal Heave +ve B.Sounds, Abd Soft, No tenderness, No rebound - guarding or rigidity. No Cyanosis, right lower extremity swelling, started to develop bruising on the lateral right lower extremity. + scrotal edema Foley present   Data Reviewed: I have personally reviewed following labs and imaging studies  CBC: Recent Labs  Lab 02/27/22 0326 02/28/22 0317 03/01/22 0258 03/02/22 0328 03/03/22 0353  WBC 6.7 7.4 9.6 11.0* 10.1  HGB 12.1* 13.2 12.9* 13.3 12.7*  HCT 34.1* 38.0* 37.8* 37.8* 35.4*  MCV 91.9 93.6 93.6 91.5 91.7  PLT 182 201 192 186 025    Basic Metabolic Panel: Recent Labs  Lab 02/24/22 1947 02/24/22 1950 02/27/22 0326 02/28/22 0317 03/01/22 0258 03/02/22 0328 03/03/22 0353  NA 136   < > 136 139 139 137 137  K 4.3   < > 3.6 3.8 3.7 3.7 3.5  CL 99   < > 90* 94* 98 100 103  CO2 20*   < > 31 27 26 23  21*  GLUCOSE 156*   < > 106* 136* 129* 125* 132*  BUN 75*   < > 85* 91* 93* 92* 90*  CREATININE 4.77*   < > 8.30* 8.48* 7.91* 6.82* 5.27*  CALCIUM 6.7*   < > 6.2* 7.5* 7.9* 8.0* 8.2*  PHOS 6.0*  --   --   --   --   --   --    < > = values in this interval not displayed.    GFR: Estimated Creatinine Clearance: 17.3 mL/min (A) (by C-G formula based on SCr of 5.27 mg/dL (H)).  Liver Function Tests: Recent Labs  Lab 02/27/22 0326 02/28/22 0317 03/01/22 0258 03/02/22 0328 03/03/22 0353  AST 518* 368* 240* 163* 117*  ALT 351* 313* 261* 214* 172*  ALKPHOS 51 49 52 46 42  BILITOT 0.8 0.8 0.6 1.0 0.9  PROT 4.6* 4.7* 4.8* 5.0* 5.0*  ALBUMIN 1.9* 2.1* 2.2* 2.2* 2.3*    CBG: Recent Labs  Lab 02/26/22 0826 02/28/22 0606  GLUCAP 142* 122*     Recent Results (from the past 240 hour(s))  Urine Culture     Status: None    Collection Time: 02/23/22 11:23 PM   Specimen: Urine, Clean Catch  Result Value Ref Range Status   Specimen Description URINE, CLEAN CATCH  Final   Special Requests NONE  Final   Culture   Final    NO GROWTH Performed at [facility], [address], [place], [place] [address]    Report Status 02/25/2022 FINAL  Final  Culture, blood (Routine X 2) w Reflex to ID Panel     Status: None   Collection Time: 02/24/22  3:38 AM   Specimen: BLOOD LEFT HAND  Result Value Ref Range Status   Specimen Description BLOOD LEFT HAND  Final   Special Requests  Final    BOTTLES DRAWN AEROBIC ONLY Blood Culture results may not be optimal due to an inadequate volume of blood received in culture bottles   Culture   Final    NO GROWTH 5 DAYS Performed at [facility], [address] [place], [place] [address]    Report Status 03/01/2022 FINAL  Final  Culture, blood (Routine X 2) w Reflex to ID Panel     Status: None   Collection Time: 02/24/22  3:41 AM   Specimen: BLOOD  Result Value Ref Range Status   Specimen Description BLOOD LEFT ANTECUBITAL  Final   Special Requests   Final    BOTTLES DRAWN AEROBIC AND ANAEROBIC Blood Culture adequate volume   Culture   Final    NO GROWTH 5 DAYS Performed at [facility], [address], [place], [place] [address]    Report Status 03/01/2022 FINAL  Final         Radiology Studies: No results found.      Scheduled Meds:  amLODipine  10 mg Oral Daily   aspirin EC  325 mg Oral Daily   calcium carbonate  2 tablet Oral BID WC   Chlorhexidine Gluconate Cloth  6 each Topical Daily   cloNIDine  0.1 mg Oral TID   heparin  5,000 Units Subcutaneous Q8H   leptospermum manuka honey  1 Application Topical Daily   metoprolol tartrate  100 mg Oral BID   Continuous Infusions:  sodium chloride Stopped (03/03/22 0834)     LOS: 7 days       Huey Bienenstock, MD [facility]   To contact the attending provider between 7A-7P  or the covering provider during after hours 7P-7A, please log into the web site [email or web address] and access using universal [place] password for that web site. If you do not have the password, please call the hospital operator.  03/03/2022, 3:49 PM

## 2022-03-03 NOTE — Progress Notes (Signed)
Mobility Specialist: Progress Note   03/03/22 1424  Mobility  Activity Transferred from chair to bed  Level of Assistance Minimal assist, patient does 75% or more  Assistive Device Front wheel walker  Distance Ambulated (ft) 16 ft  Activity Response Tolerated fair  Mobility Referral Yes  $Mobility charge 1 Mobility   Pt received in the chair and requesting to return to bed. Required minA to stand and ambulated around the end of the bed. RLE TWB fashion during ambulation secondary to pain. Pt back in bed with call bell and phone in reach. Bed alarm is on.   [place] Amoura Ransier Mobility Specialist Secure Chat Only

## 2022-03-03 NOTE — Progress Notes (Signed)
Physical Therapy Treatment Patient Details Name: Franklin Anderson MRN: [id number] DOB: Aug 04, 1970 Today's Date: 03/03/2022   History of Present Illness Franklin Anderson is a 51 y.o. 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  Patient was noted to have significant right-sided swelling, pain, CT of right hip was significant for severe muscle inflammation of the hip/gluteal muscle edema, as well he was noted to have acute renal failure, and work-up significant for CVA.    PT Comments    Patient continues to be limited by Rt hip>scrotal pain. Requires incr time and assist for all basic mobility. Able to progress to ambulating 40 ft with RW with pt using RLE in TDWB fashion (improved from yesterday and NWB). Will continue to benefit from PT to progress activity and independence.     Recommendations for follow up therapy are one component of a multi-disciplinary discharge planning process, led by the attending physician.  Recommendations may be updated based on patient status, additional functional criteria and insurance authorization.  Follow Up Recommendations  Acute inpatient rehab (3hours/day)     Assistance Recommended at Discharge Intermittent Supervision/Assistance  Patient can return home with the following Two people to help with bathing/dressing/bathroom;A little help with walking and/or transfers;Help with stairs or ramp for entrance   Equipment Recommendations  BSC/3in1;Rolling walker (2 wheels)    Recommendations for Other Services       Precautions / Restrictions Precautions Precautions: Fall Precaution Comments: scrotal edema Required Braces or Orthoses: Other Brace Other Brace: OT made scrotal  sling Restrictions Weight Bearing Restrictions: No     Mobility  Bed Mobility Overal bed mobility: Needs Assistance Bed Mobility: Supine to Sit     Supine to sit: Min assist     General bed mobility comments: Min assist to manage RLE due to pain; vc for sequencing to scoot out to EOB (with PT holding/elevating scrotum to allow scooting). Scrotal sling donned in sitting. Incr time for coming to EOB due to pain    Transfers Overall transfer level: Needs assistance Equipment used: Rolling walker (2 wheels) Transfers: Sit to/from Stand Sit to Stand: From elevated surface, Min assist           General transfer comment: pt able to bring legs close enough together to get both feet inside frame of RW; required elevated bed due to pain and pt self-limits use of RLE    Ambulation/Gait Ambulation/Gait assistance: Min assist Gait Distance (Feet): 40 Feet Assistive device: Rolling walker (2 wheels) Gait Pattern/deviations: Step-to pattern, Decreased weight shift to right, Decreased stride length, Decreased stance time - right, Antalgic, Trunk flexed, Trendelenburg, Wide base of support Gait velocity: decr     General Gait Details: pt guiding RW wihtout assist; using RLE in TDWB capacity (improved from NWB 11/6) with incr distance with encouragement   Stairs             Wheelchair Mobility    Modified Rankin (Stroke Patients Only) Modified Rankin (Stroke Patients Only) Pre-Morbid Rankin Score: No symptoms Modified Rankin: Moderately severe disability     Balance Overall balance assessment: Needs assistance Sitting-balance support: Feet supported Sitting balance-Leahy Scale: Fair     Standing balance support: Reliant on assistive device for balance, Bilateral upper extremity supported Standing balance-Leahy Scale: Poor Standing  balance comment: UE support due to pain                            Cognition Arousal/Alertness: Awake/alert Behavior During  Therapy: WFL for tasks assessed/performed Overall Cognitive Status: Within Functional Limits for tasks assessed                                 General Comments: Following all commands, increased time but due to pain. very willing to participate but pain is limiting.        Exercises      General Comments General comments (skin integrity, edema, etc.): Sling slipped loose during ambulation. OT notified ?now too loose/big?      Pertinent Vitals/Pain Pain Assessment Pain Assessment: 0-10 Pain Score: 9  Pain Location: Rt hip and scrotum Pain Descriptors / Indicators: Sharp, Aching, Grimacing, Guarding Pain Intervention(s): Limited activity within patient's tolerance, Monitored during session, Patient requesting pain meds-RN notified, Other (comment) (scrotal sling utilized)    Home Living Family/patient expects to be discharged to:: Private residence Living Arrangements: Spouse/significant other Available Help at Discharge: Family;Available 24 hours/day Type of Home: Mobile home Home Access: Stairs to enter Entrance Stairs-Rails: Can reach both Entrance Stairs-Number of Steps: 3   Home Layout: One level Home Equipment: Grab bars - tub/shower      Prior Function            PT Goals (current goals can now be found in the care plan section) Acute Rehab PT Goals Patient Stated Goal: to return to independent PT Goal Formulation: With patient Time For Goal Achievement: 03/12/22 Potential to Achieve Goals: Good Progress towards PT goals: Progressing toward goals    Frequency    Min 4X/week      PT Plan Current plan remains appropriate    Co-evaluation              AM-PAC PT "6 Clicks" Mobility   Outcome Measure  Help needed turning from your back to your side while in a flat bed without using bedrails?: A Little Help needed moving from lying on your back to sitting on the side of a flat bed without using bedrails?: A Little Help needed moving  to and from a bed to a chair (including a wheelchair)?: A Lot Help needed standing up from a chair using your arms (e.g., wheelchair or bedside chair)?: A Lot Help needed to walk in hospital room?: A Little Help needed climbing 3-5 steps with a railing? : Total 6 Click Score: 14    End of Session   Activity Tolerance: Patient limited by pain Patient left: with call bell/phone within reach;in chair (chair alarm not working and RN made aware) Nurse Communication: Mobility status PT Visit Diagnosis: Difficulty in walking, not elsewhere classified (R26.2);Pain;History of falling (Z91.81) Pain - Right/Left: Right Pain - part of body: Hip     Time: 0952-1020 PT Time Calculation (min) (ACUTE ONLY): 28 min  Charges:  $Gait Training: 8-22 mins $Therapeutic Activity: 8-22 mins                      [place]  Office [phone]    Rexanne Mano 03/03/2022, 10:58 AM

## 2022-03-03 NOTE — Progress Notes (Signed)
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  Making urine but may not be enough to get by without dialysis.  I discussed this with the pt and he is in agreement if we need to proceed - serial BMP checks - Foley - looks like we have avoided dialysis - aggressive K management as below--> now normalized - Cr finally plateauing and improving each day, last Lasix 80 IV on 11/3, still great UOP. Right leg certainly > Left LE + scrotal edema. Scrotal sling to see if it will help; was on for some time yest but off currently. - net neg 2174 so far during this hospitalization; will give a single dose of Lasix 80mg  and monitor response. 2.  Hyperkalemia: rhabdo + losartan + NSAIDs             - calcium, insulin/ dextrose in ED             - s/p bicarb gtt             - s/p albuterol 10 mg neb             - s/p lokelma TID (no longer on lokelma)     - on empiric antibiotics with linezolid and zosyn-- now off - ortho c/s, appreciate assistance   3.  Hypocalcemia:             - aggressive repletion  - TUMS 2 TID   - better   4.  HTN             - holding losartan  - amlodipine, clonidine, metop   5.  Polysubstance abuse             - CIWA protocol   6.  Elevated LFTS:             - likely d/t rhabdo, consider RUQ imaging, exam benign, per primary  7.  Acute CVA  - workup per neuro   8.  Dispo: admitted  Subjective:    Cr improving w/ good UOP; main complaint is right hip pain.  Denies SOB but more positive over the past 24hrs now only net neg 2135mL.     Objective:   BP (!) 162/92 (BP Location: Right Arm)   Pulse 92   Temp 98.4 F (36.9 C) (Oral)   Resp 16   Ht 6' (1.829 m)   Wt 73.9 kg   SpO2 95%   BMI 22.10 kg/m   Intake/Output Summary (Last 24 hours) at 03/03/2022 0816 Last data filed at 03/03/2022 0500 Gross per 24 hour  Intake 117 ml  Output 6450 ml  Net -6333 ml   Weight change: -2.2  kg  Physical Exam: GEN: awake and alert, sitting in bed HEENT EOMI PERRL, poor dentition NECK some increased JVD  PULM clear CV RRR ABD thin EXT right lower ext edema, scrotal edema, left leg only edema high in the thigh NEURO AAO x 3 SKIN dry, ruddy complexion, mult tattooos MSK severe R hip pain, able to move right leg slightly more than yest  Imaging: No results found.  Labs: BMET Recent Labs  Lab 02/24/22 1231 02/24/22 1947 02/24/22 1950 02/25/22 0332 02/26/22 0349 02/27/22 0326 02/28/22 0317 03/01/22 0258 03/02/22 0328 03/03/22 0353  NA 138 136   < > 133* 135 136 139 139 137 137  K 5.7* 4.3   < > 3.6 3.7 3.6 3.8 3.7 3.7 3.5  CL 105 99  --  95* 88* 90* 94* 98 100 103  CO2 19* 20*  --  23 31 31 27 26 23  21*  GLUCOSE 95 156*  --  162* 131* 106* 136* 129* 125* 132*  BUN 71* 75*  --  74* 79* 85* 91* 93* 92* 90*  CREATININE 4.12* 4.77*  --  5.41* 7.07* 8.30* 8.48* 7.91* 6.82* 5.27*  CALCIUM 6.8* 6.7*  --  6.4* 6.1* 6.2* 7.5* 7.9* 8.0* 8.2*  PHOS 6.6* 6.0*  --   --   --   --   --   --   --   --    < > = values in this interval not displayed.   CBC Recent Labs  Lab 02/28/22 0317 03/01/22 0258 03/02/22 0328 03/03/22 0353  WBC 7.4 9.6 11.0* 10.1  HGB 13.2 12.9* 13.3 12.7*  HCT 38.0* 37.8* 37.8* 35.4*  MCV 93.6 93.6 91.5 91.7  PLT 201 192 186 187    Medications:     amLODipine  10 mg Oral Daily   aspirin EC  325 mg Oral Daily   calcium carbonate  2 tablet Oral BID WC   Chlorhexidine Gluconate Cloth  6 each Topical Daily   cloNIDine  0.1 mg Oral TID   heparin  5,000 Units Subcutaneous Q8H   leptospermum manuka honey  1 Application Topical Daily   metoprolol tartrate  100 mg Oral BID    03/03/2022, 8:16 AM

## 2022-03-03 NOTE — Progress Notes (Signed)
PROGRESS NOTE    Franklin Anderson  MRN:[id number] DOB: 05/01/1970 DOA: 02/23/2022 PCP: Fulp, Cammie, MD   Chief Complaint  Patient presents with   Code Stroke    Brief Narrative:    Franklin Anderson is a 51 y.o. male with medical history significant of polysubstance abuse, essential hypertension , rheumatic fever with subsequent heart murmur who presents to ED BIB EMS after being found down at home in his own feces,after falling out of bed last pm.  Arrival to ED patient was noted to have significant right-sided swelling, pain, CT of right hip was significant for severe muscle inflammation of the hip/gluteal muscle edema, as well he was noted to have acute renal failure, and work-up significant for CVA.  Does endorse polysubstance abuse, he denies IV injection, reports snorting.    Assessment & Plan:   Principal Problem:   Change in mental status Active Problems:   Uncontrolled hypertension   Polysubstance abuse (HCC)  AKI Severe rhabdomyolysis -Renal input greatly appreciated, total CK improving on IV fluids -Creatinine trending up initially, currently plateauing and trending down minimally -Continue with Foley catheter  -Bicarb Drip has been discontinued as bicarb level is normalized -Management per nephrology, fluids, will receive IV Lasix today.  Acute Metabolic  Encephalopathy  -due to polysubstance abuse and acute CVA . -Mental status significantly improved .  Back to baseline  Hyperkalemia -Resolved  Hypocalcemia -Repleted, continue to monitor, will decrease supplements to twice daily  Hypertension -Blood pressure significantly elevated, (patient is out of permissive HTN period per neuro note) -Improved after increasing his clonidine and metoprolol, continue with amlodipine and as needed hydralazine, is elevated, will increase his clonidine dosing today.     Acute CVA -He denies any IV drug abuse, -By neurology, recommendation for TEE, scheduled for  Wednesday.  Polysubstance abuse - CIWA protocol -Patient denies any IV drug abuse, reports he just snorted, he is adamant that no IV drug abuse since 2020.    Elevated LFTS: -Most likely due to above, trending down. 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  .  HCV RNA is elevated, ID to schedule an appointment in 1 month.  I have discussed with his wife as well after I obtained his permission, and notified her of above finding and she will follow with her PCP to get tested as well.     Severe muscle inflammation of right hip and gluteal muscle/crush injury -Is most likely due to severe rhabdomyolysis/myositis/ crush injury-most likely in the setting of cocaine use, as well possibly related to laying on the floor for unknown period of time  -No orthopedic surgery recommended at this time per orthopedic . -Right lower extremity movement started to improve, upon presentation,he  could not move his right leg at all   Right outer calf pressure ulcer -Initially bruise on admission, progressed to full-thickness pressure ulcer -Wound care consulted   DVT prophylaxis: Freedom heaprin Code Status: Full Family Communication: discussed with his wife per his permission on 11/3, 11/6 Disposition:   Status is: Inpatient    Consultants:  Neurology Nephrology Orthopedic   Subjective:  No significant events overnight, still reports pain at right lower extremity but has improved Objective: Vitals:   03/03/22 0500 03/03/22 0807 03/03/22 0811 03/03/22 1207  BP:  (!) 162/92 (!) 162/92 (!) 146/90  Pulse:  92 92 83  Resp:   16 17  Temp:   98.4 F (36.9 C) 98 F (36.7   C)  TempSrc:   Oral Oral  SpO2:   95% 98%  Weight: 73.9 kg     Height:        Intake/Output Summary (Last 24 hours) at 03/03/2022 1549 Last data filed at 03/03/2022 1214 Gross per 24 hour  Intake 1240 ml  Output 6175 ml  Net -4935 ml   Filed Weights   02/26/22 0500 03/02/22 0500 03/03/22  0500  Weight: 77.3 kg 76.1 kg 73.9 kg    Examination:   Awake Alert, Oriented X 3, No new F.N deficits, Normal affect Symmetrical Chest wall movement, Good air movement bilaterally, CTAB RRR,No Gallops,Rubs or new Murmurs, No Parasternal Heave +ve B.Sounds, Abd Soft, No tenderness, No rebound - guarding or rigidity. No Cyanosis, right lower extremity swelling, started to develop bruising on the lateral right lower extremity. + scrotal edema Foley present   Data Reviewed: I have personally reviewed following labs and imaging studies  CBC: Recent Labs  Lab 02/27/22 0326 02/28/22 0317 03/01/22 0258 03/02/22 0328 03/03/22 0353  WBC 6.7 7.4 9.6 11.0* 10.1  HGB 12.1* 13.2 12.9* 13.3 12.7*  HCT 34.1* 38.0* 37.8* 37.8* 35.4*  MCV 91.9 93.6 93.6 91.5 91.7  PLT 182 201 192 186 025    Basic Metabolic Panel: Recent Labs  Lab 02/24/22 1947 02/24/22 1950 02/27/22 0326 02/28/22 0317 03/01/22 0258 03/02/22 0328 03/03/22 0353  NA 136   < > 136 139 139 137 137  K 4.3   < > 3.6 3.8 3.7 3.7 3.5  CL 99   < > 90* 94* 98 100 103  CO2 20*   < > 31 27 26 23  21*  GLUCOSE 156*   < > 106* 136* 129* 125* 132*  BUN 75*   < > 85* 91* 93* 92* 90*  CREATININE 4.77*   < > 8.30* 8.48* 7.91* 6.82* 5.27*  CALCIUM 6.7*   < > 6.2* 7.5* 7.9* 8.0* 8.2*  PHOS 6.0*  --   --   --   --   --   --    < > = values in this interval not displayed.    GFR: Estimated Creatinine Clearance: 17.3 mL/min (A) (by C-G formula based on SCr of 5.27 mg/dL (H)).  Liver Function Tests: Recent Labs  Lab 02/27/22 0326 02/28/22 0317 03/01/22 0258 03/02/22 0328 03/03/22 0353  AST 518* 368* 240* 163* 117*  ALT 351* 313* 261* 214* 172*  ALKPHOS 51 49 52 46 42  BILITOT 0.8 0.8 0.6 1.0 0.9  PROT 4.6* 4.7* 4.8* 5.0* 5.0*  ALBUMIN 1.9* 2.1* 2.2* 2.2* 2.3*    CBG: Recent Labs  Lab 02/26/22 0826 02/28/22 0606  GLUCAP 142* 122*     Recent Results (from the past 240 hour(s))  Urine Culture     Status: None    Collection Time: 02/23/22 11:23 PM   Specimen: Urine, Clean Catch  Result Value Ref Range Status   Specimen Description URINE, CLEAN CATCH  Final   Special Requests NONE  Final   Culture   Final    NO GROWTH Performed at [facility], [address], [place], [place] [address]    Report Status 02/25/2022 FINAL  Final  Culture, blood (Routine X 2) w Reflex to ID Panel     Status: None   Collection Time: 02/24/22  3:38 AM   Specimen: BLOOD LEFT HAND  Result Value Ref Range Status   Specimen Description BLOOD LEFT HAND  Final   Special Requests  Final    BOTTLES DRAWN AEROBIC ONLY Blood Culture results may not be optimal due to an inadequate volume of blood received in culture bottles   Culture   Final    NO GROWTH 5 DAYS Performed at [facility], [address] [place], [place] [address]    Report Status 03/01/2022 FINAL  Final  Culture, blood (Routine X 2) w Reflex to ID Panel     Status: None   Collection Time: 02/24/22  3:41 AM   Specimen: BLOOD  Result Value Ref Range Status   Specimen Description BLOOD LEFT ANTECUBITAL  Final   Special Requests   Final    BOTTLES DRAWN AEROBIC AND ANAEROBIC Blood Culture adequate volume   Culture   Final    NO GROWTH 5 DAYS Performed at [facility], [address] [place], [place] [address]    Report Status 03/01/2022 FINAL  Final         Radiology Studies: No results found.      Scheduled Meds:  amLODipine  10 mg Oral Daily   aspirin EC  325 mg Oral Daily   calcium carbonate  2 tablet Oral BID WC   Chlorhexidine Gluconate Cloth  6 each Topical Daily   cloNIDine  0.1 mg Oral TID   heparin  5,000 Units Subcutaneous Q8H   leptospermum manuka honey  1 Application Topical Daily   metoprolol tartrate  100 mg Oral BID   Continuous Infusions:  sodium chloride Stopped (03/03/22 0834)     LOS: 7 days       Yatziri Wainwright, MD [facility]   To contact the attending provider between 7A-7P  or the covering provider during after hours 7P-7A, please log into the web site [email or web address] and access using universal [place] password for that web site. If you do not have the password, please call the hospital operator.  03/03/2022, 3:49 PM    

## 2022-03-03 NOTE — Progress Notes (Signed)
Inpatient Rehab Admissions Coordinator:  Await medical clearance and bed availability. Will continue to follow.   Johnthan Axtman Graves Madden, MS, CCC-SLP Admissions Coordinator [phone]  

## 2022-03-03 NOTE — Progress Notes (Signed)
   [facility] has been requested to perform a transesophageal echocardiogram on Franklin Anderson for CVA.  Hx of poly substance abuse (denies IV drug use) and rheumatic fever in past with heart murmur, admitted with severe rhabdomyolysis.   After careful review of history and examination, the risks and benefits of transesophageal echocardiogram have been explained including risks of esophageal damage, perforation (1:10,000 risk), bleeding, pharyngeal hematoma as well as other potential complications associated with conscious sedation including aspiration, arrhythmia, respiratory failure and death. Alternatives to treatment were discussed, questions were answered. Patient is willing to proceed.    Cecilie Kicks, NP  03/03/2022 3:42 PM

## 2022-03-04 ENCOUNTER — Encounter (HOSPITAL_COMMUNITY)
Admission: EM | Disposition: A | Discharge status: Rehabilitation Facility | Payer: Self-pay | Source: Home / Self Care | Attending: Internal Medicine

## 2022-03-04 ENCOUNTER — Inpatient Hospital Stay (HOSPITAL_COMMUNITY): Payer: Self-pay | Admitting: Anesthesiology

## 2022-03-04 ENCOUNTER — Encounter (HOSPITAL_COMMUNITY): Payer: Self-pay | Admitting: Internal Medicine

## 2022-03-04 ENCOUNTER — Inpatient Hospital Stay (HOSPITAL_COMMUNITY)
Admission: EM | Admit: 2022-03-04 | Discharge: 2022-03-04 | Disposition: A | Discharge status: Home / Self Care | Payer: Self-pay | Source: Home / Self Care | Attending: Cardiology | Admitting: Cardiology

## 2022-03-04 DIAGNOSIS — I700 Atherosclerosis of aorta: Secondary | ICD-10-CM

## 2022-03-04 DIAGNOSIS — I639 Cerebral infarction, unspecified: Secondary | ICD-10-CM

## 2022-03-04 DIAGNOSIS — I10 Essential (primary) hypertension: Secondary | ICD-10-CM

## 2022-03-04 DIAGNOSIS — F17210 Nicotine dependence, cigarettes, uncomplicated: Secondary | ICD-10-CM

## 2022-03-04 DIAGNOSIS — Z8673 Personal history of transient ischemic attack (TIA), and cerebral infarction without residual deficits: Secondary | ICD-10-CM

## 2022-03-04 HISTORY — PX: BUBBLE STUDY: SHX6837

## 2022-03-04 HISTORY — PX: TEE WITHOUT CARDIOVERSION: SHX5443

## 2022-03-04 LAB — CBC
HCT: 34.2 % — ABNORMAL LOW (ref 39.0–52.0)
Hemoglobin: 11.7 g/dL — ABNORMAL LOW (ref 13.0–17.0)
MCH: 32.1 pg (ref 26.0–34.0)
MCHC: 34.2 g/dL (ref 30.0–36.0)
MCV: 93.7 fL (ref 80.0–100.0)
Platelets: 212 10*3/uL (ref 150–400)
RBC: 3.65 MIL/uL — ABNORMAL LOW (ref 4.22–5.81)
RDW: 12.8 % (ref 11.5–15.5)
WBC: 8.5 10*3/uL (ref 4.0–10.5)
nRBC: 0 % (ref 0.0–0.2)

## 2022-03-04 LAB — PROTIME-INR
INR: 1 (ref 0.8–1.2)
Prothrombin Time: 13.1 seconds (ref 11.4–15.2)

## 2022-03-04 LAB — COMPREHENSIVE METABOLIC PANEL
ALT: 137 U/L — ABNORMAL HIGH (ref 0–44)
AST: 80 U/L — ABNORMAL HIGH (ref 15–41)
Albumin: 2.3 g/dL — ABNORMAL LOW (ref 3.5–5.0)
Alkaline Phosphatase: 43 U/L (ref 38–126)
Anion gap: 8 (ref 5–15)
BUN: 84 mg/dL — ABNORMAL HIGH (ref 6–20)
CO2: 23 mmol/L (ref 22–32)
Calcium: 8.4 mg/dL — ABNORMAL LOW (ref 8.9–10.3)
Chloride: 108 mmol/L (ref 98–111)
Creatinine, Ser: 3.9 mg/dL — ABNORMAL HIGH (ref 0.61–1.24)
GFR, Estimated: 18 mL/min — ABNORMAL LOW (ref >60)
Glucose, Bld: 118 mg/dL — ABNORMAL HIGH (ref 70–99)
Potassium: 3.7 mmol/L (ref 3.5–5.1)
Sodium: 139 mmol/L (ref 135–145)
Total Bilirubin: 0.8 mg/dL (ref 0.3–1.2)
Total Protein: 5 g/dL — ABNORMAL LOW (ref 6.5–8.1)

## 2022-03-04 LAB — CK: Total CK: 2338 U/L — ABNORMAL HIGH (ref 49–397)

## 2022-03-04 SURGERY — ECHOCARDIOGRAM, TRANSESOPHAGEAL
Anesthesia: Monitor Anesthesia Care

## 2022-03-04 MED ORDER — ORAL CARE MOUTH RINSE: 15.0000 mL | OROMUCOSAL | Status: DC | PRN

## 2022-03-04 MED ORDER — HYDROMORPHONE HCL 1 MG/ML IJ SOLN
1.0000 mg | INTRAMUSCULAR | Status: DC | PRN
  Administered 2022-03-04 – 2022-03-05 (×5): 1 mg via INTRAVENOUS
  Filled 2022-03-04 (×5): qty 1

## 2022-03-04 MED ORDER — LIDOCAINE 2% (20 MG/ML) 5 ML SYRINGE: INTRAMUSCULAR | Status: DC | PRN

## 2022-03-04 MED ORDER — SODIUM CHLORIDE 0.9 % IV SOLN: INTRAVENOUS | Status: DC

## 2022-03-04 MED ORDER — PROPOFOL 500 MG/50ML IV EMUL
INTRAVENOUS | Status: DC | PRN
  Administered 2022-03-04: 100 ug/kg/min via INTRAVENOUS

## 2022-03-04 MED ORDER — OXYCODONE HCL 5 MG PO TABS
5.0000 mg | ORAL_TABLET | ORAL | Status: DC | PRN
  Administered 2022-03-04 – 2022-03-05 (×6): 10 mg via ORAL
  Filled 2022-03-04 (×6): qty 2

## 2022-03-04 MED ORDER — LIDOCAINE 2% (20 MG/ML) 5 ML SYRINGE
INTRAMUSCULAR | Status: DC | PRN
  Administered 2022-03-04: 60 mg via INTRAVENOUS

## 2022-03-04 NOTE — CV Procedure (Signed)
    TRANSESOPHAGEAL ECHOCARDIOGRAM   NAME:  Franklin Anderson    MRN: [id number] DOB:  [birth year]    ADMIT DATE: 02/23/2022  INDICATIONS: CVA  PROCEDURE:   Informed consent was obtained prior to the procedure. The risks, benefits and alternatives for the procedure were discussed and the patient comprehended these risks.  Risks include, but are not limited to, cough, sore throat, vomiting, nausea, somnolence, esophageal and stomach trauma or perforation, bleeding, low blood pressure, aspiration, pneumonia, infection, trauma to the teeth and death.    Procedural time out performed. The oropharynx was anesthetized with viscous lidocaine.  Anesthesia was administered by the anaesthesilogy team to achieve and maintain moderate to deep conscious sedation.  The patient's heart rate, blood pressure, and oxygen saturation were monitored continuously during the procedure.  The transesophageal probe was inserted in the esophagus and stomach without difficulty and multiple views were obtained.   The patient tolerated the procedure well.  COMPLICATIONS:    There were no immediate complications.  KEY FINDINGS:  EF normal, No PFO, No left atrial appendage clot, atherosclerotic plaque in the descending aorta.  Full report to follow. Further management per primary team.   Thomasene Ripple, DO [facility] [place]  [facility]  1:51 PM

## 2022-03-04 NOTE — Interval H&P Note (Signed)
History and Physical Interval Note:  03/04/2022 12:45 PM  Franklin Anderson  has presented today for surgery, with the diagnosis of STROKE.  The various methods of treatment have been discussed with the patient and family. After consideration of risks, benefits and other options for treatment, the patient has consented to  Procedure(s): TRANSESOPHAGEAL ECHOCARDIOGRAM (TEE) (N/A) as a surgical intervention.  The patient's history has been reviewed, patient examined, no change in status, stable for surgery.  I have reviewed the patient's chart and labs.  Questions were answered to the patient's satisfaction.     Aqua Denslow

## 2022-03-04 NOTE — Anesthesia Procedure Notes (Signed)
Procedure Name: MAC Date/Time: 03/04/2022 1:19 PM  Performed by: Kyung Rudd, CRNAPre-anesthesia Checklist: Patient identified, Emergency Drugs available, Suction available and Patient being monitored Patient Re-evaluated:Patient Re-evaluated prior to induction Oxygen Delivery Method: Nasal cannula Preoxygenation: Pre-oxygenation with 100% oxygen Induction Type: IV induction Placement Confirmation: positive ETCO2 Dental Injury: Teeth and Oropharynx as per pre-operative assessment

## 2022-03-04 NOTE — Anesthesia Postprocedure Evaluation (Signed)
Anesthesia Post Note  Patient: Franklin Anderson  Procedure(s) Performed: TRANSESOPHAGEAL ECHOCARDIOGRAM (TEE) BUBBLE STUDY     Patient location during evaluation: PACU Anesthesia Type: MAC Level of consciousness: awake and alert Pain management: pain level controlled Vital Signs Assessment: post-procedure vital signs reviewed and stable Respiratory status: spontaneous breathing, nonlabored ventilation, respiratory function stable and patient connected to nasal cannula oxygen Cardiovascular status: stable and blood pressure returned to baseline Postop Assessment: no apparent nausea or vomiting Anesthetic complications: no   No notable events documented.  Last Vitals:  Vitals:   03/04/22 1417 03/04/22 1551  BP: (!) 149/98 (!) 140/86  Pulse: 75 85  Resp: 19 19  Temp: (!) 36.4 C 36.6 C  SpO2: 99% 96%    Last Pain:  Vitals:   03/04/22 1551  TempSrc: Oral  PainSc:                  March Rummage Katrina Brosh

## 2022-03-04 NOTE — Anesthesia Preprocedure Evaluation (Signed)
Anesthesia Evaluation  Patient identified by MRN, date of birth, ID band Patient awake    Reviewed: Allergy & Precautions, NPO status , Patient's Chart, lab work & pertinent test results  Airway Mallampati: II  TM Distance: >3 FB Neck ROM: Full    Dental no notable dental hx. (+) Loose,    Pulmonary neg pulmonary ROS, Current Smoker and Patient abstained from smoking.   Pulmonary exam normal        Cardiovascular hypertension, Pt. on medications and Pt. on home beta blockers  Rhythm:Regular Rate:Normal  ECHO 11/23:  1. Left ventricular ejection fraction, by estimation, is 55%. The left  ventricle has normal function. The left ventricle has no regional wall  motion abnormalities. There is mild concentric left ventricular  hypertrophy. Left ventricular diastolic  parameters are consistent with Grade I diastolic dysfunction (impaired  relaxation).   2. Right ventricular systolic function is normal. The right ventricular  size is normal. Tricuspid regurgitation signal is inadequate for assessing  PA pressure.   3. The mitral valve is normal in structure. Trivial mitral valve  regurgitation. No evidence of mitral stenosis.   4. The aortic valve is tricuspid. Aortic valve regurgitation is trivial.  No aortic stenosis is present.   5. The inferior vena cava is normal in size with greater than 50%  respiratory variability, suggesting right atrial pressure of 3 mmHg.     Neuro/Psych   Anxiety     CVA    GI/Hepatic negative GI ROS,,,(+)     substance abuse  IV drug use  Endo/Other  negative endocrine ROS    Renal/GU   negative genitourinary   Musculoskeletal negative musculoskeletal ROS (+)    Abdominal Normal abdominal exam  (+)   Peds  Hematology negative hematology ROS (+)   Anesthesia Other Findings   Reproductive/Obstetrics                             Anesthesia Physical Anesthesia  Plan  ASA: 3  Anesthesia Plan: MAC   Post-op Pain Management:    Induction: Intravenous  PONV Risk Score and Plan: Propofol infusion and Treatment may vary due to age or medical condition  Airway Management Planned: Simple Face Mask, Natural Airway and Nasal Cannula  Additional Equipment: None  Intra-op Plan:   Post-operative Plan:   Informed Consent: I have reviewed the patients History and Physical, chart, labs and discussed the procedure including the risks, benefits and alternatives for the proposed anesthesia with the patient or authorized representative who has indicated his/her understanding and acceptance.     Dental advisory given  Plan Discussed with: CRNA  Anesthesia Plan Comments:        Anesthesia Quick Evaluation

## 2022-03-04 NOTE — Progress Notes (Signed)
Progress Note   Patient: Franklin Anderson MPN:[id number] DOB: 24-Dec-1970 DOA: 02/23/2022     8 DOS: the patient was seen and examined on 03/04/2022   Brief hospital course: 51 year old man presented after being found down at home with significant right-sided swelling and pain, CT right hip showed severe muscle inflammation and work-up revealed acute renal failure as well as CVA.  Assessment and Plan: No notes have been filed under this hospital service. Service: Hospitalist  AKI Severe rhabdomyolysis -Renal input greatly appreciated, total CK continues to improve on IV fluids -Creatinine trending down -Continue with Foley catheter , excellent UOP, expect continued improvement. -Nephrology following peripherally, fluids, will receive IV Lasix today.   Acute Metabolic  Encephalopathy  --resolved, secondary  to polysubstance abuse and acute CVA .   Hyperkalemia -Resolved   Hypocalcemia -Repleted, continue to monitor, will decrease supplements to twice daily   Essential hypertension -Improved after increasing his clonidine and metoprolol, continue with amlodipine and as needed hydralazine    Acute CVA --TEE unrevealing, continue ASA, no DAPT per neurology presently, but will revisit tomorrow   Polysubstance abuse - CIWA protocolm no s/s of withdrawal -Patient denies any IV drug abuse, reports he just snorted, he is adamant that no IV drug abuse since 2020.    Elevated LFTS: -Most likely due to above, trending down. 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  Marland Kitchen  HCV RNA is elevated, ID to schedule an appointment in 1 month.  Dr. Bea Laura discussed with his wife as well after obtained his permission, and notified her of above finding and she will follow with her PCP to get tested as well.     Severe muscle inflammation of right hip and gluteal muscle/crush injury -Is most likely due to severe rhabdomyolysis/myositis/ crush injury-most likely in the  setting of cocaine use, as well possibly related to laying on the floor for unknown period of time  -No orthopedic surgery recommended at this time per orthopedic . -Right lower extremity movement started to improve, upon presentation,he  could not move his right leg at all   Right outer calf pressure ulcer -Initially bruise on admission, progressed to full-thickness pressure ulcer -Wound care consulted  Subjective:  Feels better except right hip  Physical Exam: Vitals:   03/04/22 1405 03/04/22 1417 03/04/22 1551 03/04/22 1938  BP: 134/89 (!) 149/98 (!) 140/86 (!) 153/97  Pulse: 76 75 85 93  Resp: (!) 29 19 19 18   Temp:  (!) 97.5 F (36.4 C) 97.9 F (36.6 C) 98.4 F (36.9 C)  TempSrc:  Oral Oral Oral  SpO2: 97% 99% 96% 95%  Weight:      Height:       Physical Exam Vitals reviewed.  Constitutional:      General: He is not in acute distress.    Appearance: He is not ill-appearing or toxic-appearing.  Cardiovascular:     Rate and Rhythm: Normal rate and regular rhythm.     Heart sounds: No murmur heard. Pulmonary:     Effort: Pulmonary effort is normal. No respiratory distress.     Breath sounds: No wheezing, rhonchi or rales.  Neurological:     Mental Status: He is alert.  Psychiatric:        Mood and Affect: Mood normal.        Behavior: Behavior normal.     Data Reviewed: Creatinine 3.9 AST down to 89 ALT down to 137 CK down to  2338 Hgb stable TEE noted  Family Communication: none  Disposition: Status is: Inpatient Remains inpatient appropriate because: needs CIR  Planned Discharge Destination:  CIR    Time spent: 25 minutes  Author: Brendia Sacks, MD 03/04/2022 7:49 PM  For on call review [email or web address].

## 2022-03-04 NOTE — Transfer of Care (Signed)
Immediate Anesthesia Transfer of Care Note  Patient: HATCHER FRONING  Procedure(s) Performed: TRANSESOPHAGEAL ECHOCARDIOGRAM (TEE) BUBBLE STUDY  Patient Location: Endoscopy Unit  Anesthesia Type:MAC  Level of Consciousness: awake, alert , and oriented  Airway & Oxygen Therapy: Patient Spontanous Breathing  Post-op Assessment: Report given to RN, Post -op Vital signs reviewed and stable, and Patient moving all extremities X 4  Post vital signs: Reviewed and stable  Last Vitals:  Vitals Value Taken Time  BP 115/86 03/04/22 1350  Temp 37.1 C 03/04/22 1347  Pulse 79 03/04/22 1352  Resp 25 03/04/22 1352  SpO2 95 % 03/04/22 1352  Vitals shown include unvalidated device data.  Last Pain:  Vitals:   03/04/22 1347  TempSrc: Temporal  PainSc: 9       Patients Stated Pain Goal: 2 (01/77/93 9030)  Complications: No notable events documented.

## 2022-03-04 NOTE — Progress Notes (Signed)
Physical Therapy Treatment Patient Details Name: Franklin Anderson MRN: [id number] DOB: 1970-10-03 Today's Date: 03/04/2022   History of Present Illness Franklin Anderson is a 51 y.o. 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  Patient was noted to have significant right-sided swelling, pain, CT of right hip was significant for severe muscle inflammation of the hip/gluteal muscle edema, as well he was noted to have acute renal failure, and work-up significant for CVA.    PT Comments    Pt with continued progress towards acute goals, however pt limited secondary to pain and increased fatigue this session with NPO status for procedure. Pt mobilizing OOB with up to mod asssit and increased time and demonstrating gait with RW with min assist demonstrating TDWB of RLE with cues, as pt with tendency for NWB due to pain. Pain continues to be most limiting factor, however pt cooperative and motivated to progress. Current plan remains appropriate to address deficits and maximize functional independence and decrease caregiver burden. Pt continues to benefit from skilled PT services to progress toward functional mobility goals.     Recommendations for follow up therapy are one component of a multi-disciplinary discharge planning process, led by the attending physician.  Recommendations may be updated based on patient status, additional functional criteria and insurance authorization.  Follow Up Recommendations  Acute inpatient rehab (3hours/day)     Assistance Recommended at Discharge Intermittent Supervision/Assistance  Patient can return home with the following Two people to help with bathing/dressing/bathroom;A little help with walking and/or  transfers;Help with stairs or ramp for entrance   Equipment Recommendations  BSC/3in1;Rolling walker (2 wheels)    Recommendations for Other Services       Precautions / Restrictions Precautions Precautions: Fall Precaution Comments: scrotal edema Required Braces or Orthoses: Other Brace Other Brace: OT made scrotal sling Restrictions Weight Bearing Restrictions: No     Mobility  Bed Mobility Overal bed mobility: Needs Assistance Bed Mobility: Supine to Sit     Supine to sit: Min assist Sit to supine: Mod assist   General bed mobility comments: Min assist to manage RLE due to pain; Incr time for coming to EOB due to pain, mod asssit to return RLE to bed    Transfers Overall transfer level: Needs assistance Equipment used: Rolling walker (2 wheels) Transfers: Sit to/from Stand Sit to Stand: From elevated surface, Min assist           General transfer comment: pt able to bring legs close enough together to get both feet inside frame of RW; required elevated bed due to pain and pt self-limits use of RLE    Ambulation/Gait Ambulation/Gait assistance: Min assist Gait Distance (Feet): 30 Feet Assistive device: Rolling walker (2 wheels) Gait Pattern/deviations: Step-to pattern, Decreased weight shift to right, Decreased stride length, Decreased stance time - right, Antalgic, Trunk flexed, Trendelenburg, Wide base of support Gait velocity: decr     General Gait Details: pt guiding RW wihtout assist; using RLE in very light TDWB with cues with more NWB secondary to incr pain   Stairs             Wheelchair Mobility    Modified Rankin (Stroke Patients Only) Modified Rankin (Stroke Patients Only) Pre-Morbid Rankin Score: No symptoms Modified Rankin: Moderately severe  disability     Balance Overall balance assessment: Needs assistance Sitting-balance support: Feet supported Sitting balance-Leahy Scale: Fair     Standing balance support: Reliant on  assistive device for balance, Bilateral upper extremity supported Standing balance-Leahy Scale: Poor Standing balance comment: UE support due to pain                            Cognition Arousal/Alertness: Awake/alert Behavior During Therapy: WFL for tasks assessed/performed Overall Cognitive Status: Within Functional Limits for tasks assessed                                 General Comments: Following all commands, increased time but due to pain. very willing to participate but pain is limiting.        Exercises      General Comments General comments (skin integrity, edema, etc.): pt limited this session secondary to increased pain and fatigue with NPO status for TEE      Pertinent Vitals/Pain Pain Assessment Pain Assessment: Faces Faces Pain Scale: Hurts whole lot Pain Location: Rt hip and scrotum Pain Descriptors / Indicators: Sharp, Aching, Grimacing, Guarding Pain Intervention(s): Monitored during session, Limited activity within patient's tolerance, Patient requesting pain meds-RN notified    Home Living                          Prior Function            PT Goals (current goals can now be found in the care plan section) Acute Rehab PT Goals Patient Stated Goal: to return to independent PT Goal Formulation: With patient Time For Goal Achievement: 03/12/22    Frequency    Min 4X/week      PT Plan Current plan remains appropriate    Co-evaluation              AM-PAC PT "6 Clicks" Mobility   Outcome Measure  Help needed turning from your back to your side while in a flat bed without using bedrails?: A Little Help needed moving from lying on your back to sitting on the side of a flat bed without using bedrails?: A Little Help needed moving to and from a bed to a chair (including a wheelchair)?: A Lot Help needed standing up from a chair using your arms (e.g., wheelchair or bedside chair)?: A Lot Help needed to  walk in hospital room?: A Little Help needed climbing 3-5 steps with a railing? : Total 6 Click Score: 14    End of Session Equipment Utilized During Treatment: Gait belt Activity Tolerance: Patient limited by pain Patient left: with call bell/phone within reach;in bed;with bed alarm set Nurse Communication: Mobility status PT Visit Diagnosis: Difficulty in walking, not elsewhere classified (R26.2);Pain;History of falling (Z91.81) Pain - Right/Left: Right Pain - part of body: Hip     Time: 7829-5621 PT Time Calculation (min) (ACUTE ONLY): 18 min  Charges:  $Gait Training: 8-22 mins                     Franklin Anderson R. PTA Acute Rehabilitation Services Office: [phone]    Catalina Antigua 03/04/2022, 9:01 AM

## 2022-03-05 ENCOUNTER — Encounter (HOSPITAL_COMMUNITY): Payer: Self-pay | Admitting: Physical Medicine & Rehabilitation

## 2022-03-05 ENCOUNTER — Inpatient Hospital Stay (HOSPITAL_COMMUNITY)
Admission: RE | Admit: 2022-03-05 | Discharge: 2022-03-13 | DRG: 057 | Disposition: A | Discharge status: Home / Self Care | Payer: Self-pay | Source: Intra-hospital | Attending: Physical Medicine & Rehabilitation | Admitting: Physical Medicine & Rehabilitation

## 2022-03-05 DIAGNOSIS — Z807 Family history of other malignant neoplasms of lymphoid, hematopoietic and related tissues: Secondary | ICD-10-CM

## 2022-03-05 DIAGNOSIS — Z79899 Other long term (current) drug therapy: Secondary | ICD-10-CM

## 2022-03-05 DIAGNOSIS — Z825 Family history of asthma and other chronic lower respiratory diseases: Secondary | ICD-10-CM

## 2022-03-05 DIAGNOSIS — M6282 Rhabdomyolysis: Secondary | ICD-10-CM | POA: Diagnosis present

## 2022-03-05 DIAGNOSIS — L89896 Pressure-induced deep tissue damage of other site: Secondary | ICD-10-CM | POA: Diagnosis present

## 2022-03-05 DIAGNOSIS — I6300 Cerebral infarction due to thrombosis of unspecified precerebral artery: Secondary | ICD-10-CM

## 2022-03-05 DIAGNOSIS — I69398 Other sequelae of cerebral infarction: Principal | ICD-10-CM

## 2022-03-05 DIAGNOSIS — F17210 Nicotine dependence, cigarettes, uncomplicated: Secondary | ICD-10-CM | POA: Diagnosis present

## 2022-03-05 DIAGNOSIS — G9341 Metabolic encephalopathy: Secondary | ICD-10-CM | POA: Insufficient documentation

## 2022-03-05 DIAGNOSIS — W06XXXD Fall from bed, subsequent encounter: Secondary | ICD-10-CM | POA: Diagnosis present

## 2022-03-05 DIAGNOSIS — Z8042 Family history of malignant neoplasm of prostate: Secondary | ICD-10-CM

## 2022-03-05 DIAGNOSIS — M609 Myositis, unspecified: Secondary | ICD-10-CM | POA: Diagnosis present

## 2022-03-05 DIAGNOSIS — I639 Cerebral infarction, unspecified: Secondary | ICD-10-CM | POA: Diagnosis present

## 2022-03-05 DIAGNOSIS — M60851 Other myositis, right thigh: Secondary | ICD-10-CM | POA: Diagnosis present

## 2022-03-05 DIAGNOSIS — S7722XD Crushing injury of left hip with thigh, subsequent encounter: Secondary | ICD-10-CM

## 2022-03-05 DIAGNOSIS — I63442 Cerebral infarction due to embolism of left cerebellar artery: Secondary | ICD-10-CM

## 2022-03-05 DIAGNOSIS — R7401 Elevation of levels of liver transaminase levels: Secondary | ICD-10-CM | POA: Diagnosis present

## 2022-03-05 DIAGNOSIS — N179 Acute kidney failure, unspecified: Secondary | ICD-10-CM | POA: Diagnosis present

## 2022-03-05 DIAGNOSIS — S7401XD Injury of sciatic nerve at hip and thigh level, right leg, subsequent encounter: Secondary | ICD-10-CM

## 2022-03-05 DIAGNOSIS — Z5912 Inadequate housing utilities: Secondary | ICD-10-CM

## 2022-03-05 DIAGNOSIS — R011 Cardiac murmur, unspecified: Secondary | ICD-10-CM | POA: Diagnosis present

## 2022-03-05 DIAGNOSIS — Z8249 Family history of ischemic heart disease and other diseases of the circulatory system: Secondary | ICD-10-CM

## 2022-03-05 DIAGNOSIS — Z8673 Personal history of transient ischemic attack (TIA), and cerebral infarction without residual deficits: Secondary | ICD-10-CM

## 2022-03-05 DIAGNOSIS — D649 Anemia, unspecified: Secondary | ICD-10-CM | POA: Diagnosis present

## 2022-03-05 DIAGNOSIS — F1410 Cocaine abuse, uncomplicated: Secondary | ICD-10-CM | POA: Diagnosis present

## 2022-03-05 DIAGNOSIS — I10 Essential (primary) hypertension: Secondary | ICD-10-CM | POA: Diagnosis present

## 2022-03-05 LAB — BASIC METABOLIC PANEL
Anion gap: 7 (ref 5–15)
BUN: 70 mg/dL — ABNORMAL HIGH (ref 6–20)
CO2: 22 mmol/L (ref 22–32)
Calcium: 8.3 mg/dL — ABNORMAL LOW (ref 8.9–10.3)
Chloride: 108 mmol/L (ref 98–111)
Creatinine, Ser: 2.76 mg/dL — ABNORMAL HIGH (ref 0.61–1.24)
GFR, Estimated: 27 mL/min — ABNORMAL LOW (ref >60)
Glucose, Bld: 111 mg/dL — ABNORMAL HIGH (ref 70–99)
Potassium: 3.9 mmol/L (ref 3.5–5.1)
Sodium: 137 mmol/L (ref 135–145)

## 2022-03-05 LAB — CBC
HCT: 34.5 % — ABNORMAL LOW (ref 39.0–52.0)
Hemoglobin: 12.1 g/dL — ABNORMAL LOW (ref 13.0–17.0)
MCH: 33.5 pg (ref 26.0–34.0)
MCHC: 35.1 g/dL (ref 30.0–36.0)
MCV: 95.6 fL (ref 80.0–100.0)
Platelets: 294 10*3/uL (ref 150–400)
RBC: 3.61 MIL/uL — ABNORMAL LOW (ref 4.22–5.81)
RDW: 13.1 % (ref 11.5–15.5)
WBC: 10.2 10*3/uL (ref 4.0–10.5)
nRBC: 0 % (ref 0.0–0.2)

## 2022-03-05 LAB — CREATININE, SERUM
Creatinine, Ser: 2.66 mg/dL — ABNORMAL HIGH (ref 0.61–1.24)
GFR, Estimated: 28 mL/min — ABNORMAL LOW (ref >60)

## 2022-03-05 LAB — GLUCOSE, CAPILLARY: Glucose-Capillary: 118 mg/dL — ABNORMAL HIGH (ref 70–99)

## 2022-03-05 MED ORDER — METOPROLOL TARTRATE 50 MG PO TABS
100.0000 mg | ORAL_TABLET | Freq: Two times a day (BID) | ORAL | Status: DC
  Administered 2022-03-05 – 2022-03-13 (×16): 100 mg via ORAL
  Filled 2022-03-05 (×17): qty 2

## 2022-03-05 MED ORDER — CLONIDINE HCL 0.1 MG PO TABS
0.2000 mg | ORAL_TABLET | Freq: Two times a day (BID) | ORAL | Status: DC
  Administered 2022-03-05 – 2022-03-13 (×16): 0.2 mg via ORAL
  Filled 2022-03-05 (×17): qty 2

## 2022-03-05 MED ORDER — ONDANSETRON HCL 4 MG/2ML IJ SOLN: 4.0000 mg | Freq: Four times a day (QID) | INTRAMUSCULAR | Status: DC | PRN

## 2022-03-05 MED ORDER — OXYCODONE HCL 5 MG PO TABS: 5.0000 mg | ORAL_TABLET | ORAL | Status: DC | PRN

## 2022-03-05 MED ORDER — AMLODIPINE BESYLATE 10 MG PO TABS
10.0000 mg | ORAL_TABLET | Freq: Every day | ORAL | Status: DC
  Administered 2022-03-06 – 2022-03-13 (×8): 10 mg via ORAL
  Filled 2022-03-05 (×8): qty 1

## 2022-03-05 MED ORDER — ASPIRIN 325 MG PO TBEC: 325.0000 mg | DELAYED_RELEASE_TABLET | Freq: Every day | ORAL | Status: DC

## 2022-03-05 MED ORDER — ALBUTEROL SULFATE (2.5 MG/3ML) 0.083% IN NEBU: 2.5000 mg | INHALATION_SOLUTION | Freq: Four times a day (QID) | RESPIRATORY_TRACT | Status: DC | PRN

## 2022-03-05 MED ORDER — OXYCODONE HCL 5 MG PO TABS
5.0000 mg | ORAL_TABLET | ORAL | Status: DC | PRN
  Administered 2022-03-05 – 2022-03-08 (×17): 10 mg via ORAL
  Administered 2022-03-09: 5 mg via ORAL
  Administered 2022-03-09 – 2022-03-10 (×9): 10 mg via ORAL
  Administered 2022-03-11: 5 mg via ORAL
  Administered 2022-03-11 – 2022-03-13 (×12): 10 mg via ORAL
  Filled 2022-03-05 (×41): qty 2

## 2022-03-05 MED ORDER — CALCIUM CARBONATE ANTACID 500 MG PO CHEW
2.0000 | CHEWABLE_TABLET | Freq: Two times a day (BID) | ORAL | Status: DC
  Administered 2022-03-06 – 2022-03-13 (×11): 400 mg via ORAL
  Filled 2022-03-05 (×12): qty 2

## 2022-03-05 MED ORDER — BLOOD PRESSURE CONTROL BOOK
Freq: Once | Status: AC
  Filled 2022-03-05: qty 1

## 2022-03-05 MED ORDER — CLOPIDOGREL BISULFATE 75 MG PO TABS: 75.0000 mg | ORAL_TABLET | Freq: Every day | ORAL | Status: DC

## 2022-03-05 MED ORDER — MEDIHONEY WOUND/BURN DRESSING EX PSTE
1.0000 | PASTE | Freq: Every day | CUTANEOUS | Status: DC
  Administered 2022-03-06 – 2022-03-13 (×8): 1 via TOPICAL
  Filled 2022-03-05: qty 44

## 2022-03-05 MED ORDER — AMLODIPINE BESYLATE 10 MG PO TABS: 10.0000 mg | ORAL_TABLET | Freq: Every day | ORAL | Status: DC

## 2022-03-05 MED ORDER — HEPARIN SODIUM (PORCINE) 5000 UNIT/ML IJ SOLN
5000.0000 [IU] | Freq: Three times a day (TID) | INTRAMUSCULAR | Status: DC
  Administered 2022-03-05 – 2022-03-11 (×17): 5000 [IU] via SUBCUTANEOUS
  Filled 2022-03-05 (×17): qty 1

## 2022-03-05 MED ORDER — METOPROLOL TARTRATE 100 MG PO TABS: 100.0000 mg | ORAL_TABLET | Freq: Two times a day (BID) | ORAL | Status: DC

## 2022-03-05 MED ORDER — HEPARIN SODIUM (PORCINE) 5000 UNIT/ML IJ SOLN: 5000.0000 [IU] | Freq: Three times a day (TID) | INTRAMUSCULAR | Status: DC

## 2022-03-05 MED ORDER — ASPIRIN 81 MG PO CHEW: 81.0000 mg | CHEWABLE_TABLET | Freq: Every day | ORAL | Status: DC

## 2022-03-05 MED ORDER — MEDIHONEY WOUND/BURN DRESSING EX PSTE: 1.0000 | PASTE | Freq: Every day | CUTANEOUS | Status: DC

## 2022-03-05 MED ORDER — CLONIDINE HCL 0.2 MG PO TABS: 0.2000 mg | ORAL_TABLET | Freq: Two times a day (BID) | ORAL | Status: DC

## 2022-03-05 MED ORDER — ONDANSETRON HCL 4 MG PO TABS: 4.0000 mg | ORAL_TABLET | Freq: Four times a day (QID) | ORAL | Status: DC | PRN

## 2022-03-05 NOTE — Progress Notes (Signed)
IP rehab admissions - Bed available today and will admit to CIR today.  Call me for questions.  [phone]

## 2022-03-05 NOTE — Progress Notes (Signed)
Patient arrived on unit escorted by 2 NT by bed with fluids running; patient oriented to unit and room. C/o of pain addressed.

## 2022-03-05 NOTE — Progress Notes (Signed)
PMR Admission Coordinator Pre-Admission Assessment   Patient: Franklin Anderson is an 51 y.o., male MRN: [id number] DOB: 1970/11/27 Height: 6' (182.9 cm) Weight: 71 kg   Insurance Information HMO:     PPO:      PCP:      IPA:      80/20:      OTHER:  PRIMARY: UNINSURED      Policy#:       Subscriber:  CM Name:       Phone#:      Fax#:  Pre-Cert#:       Employer:  Benefits:  Phone #:      Name:  Eff. Date:      Deduct:       Out of Pocket Max:       Life Max:  CIR:       SNF:  Outpatient:      Co-Pay:  Home Health:       Co-Pay:  DME:      Co-Pay:  Providers:  SECONDARY:       Policy#:      Phone#:    Development worker, community:       Phone#:    The Engineer, petroleum" for patients in Inpatient Rehabilitation Facilities with attached "Privacy Act [place] Records" was provided and verbally reviewed with: N/A   Emergency Contact Information Contact Information       Name Relation Home Work Mobile    Lima Diane Spouse     [phone]           Current Medical History  Patient Admitting Diagnosis: B CVA   History of Present Illness: A 51 year old right-handed male with history significant for polysubstance use.  Hypertension, rheumatic fever with subsequent heart murmur.  Per chart review patient lives with spouse.  1 level home 3 steps to entry.  Independent prior to admission.  Presented 02/23/2022 after being found down in his home covered in feces after apparent fall from the bed.  Cranial CT scan showed no acute intracranial abnormality.  Small remote right cerebellar infarct chronic in appearance but new as compared to prior CT from 2020.  Focal soft tissue thickening and/or swelling at the central forehead.  Pelvis films negative.  MRI of the brain showed acute infarction left cerebellum and corona radiata on the right.  No hemorrhage or mass effect.  Soft tissue hematoma along the midline frontal scalp.  CT of right lower extremity showed  diffuse edema and swelling of the right gluteus medius muscle, the dorsal hip abductors in the proximal thigh, and the distal aspect of the vastus medialis.  No evidence of fracture or bone lesion.  CT right upper extremity showed no soft tissue gas or fluid collection.  Admission chemistries alcohol negative, WBC 16,400, potassium 7.1 glucose 145 BUN 56 creatinine 3.33 peaking at 8.48, CK 43,333, urine drug screen positive cocaine benzos as well as amphetamines.  Echocardiogram with ejection fraction of 55% no wall motion abnormalities.  Grade 1 diastolic dysfunction.  TEE completed showing no PFO or clot.  Follow-up renal services for severe rhabdo/AKI with latest creatinine 2.76 and CK improved 2338 and currently no hemodialysis initiated.  Patient with elevated LFTs most likely due to rhabdo HCV AB+ but has been in the past PCR negative in 2020 with HCV-RNA being elevated ID to schedule an appointment as an outpatient in approximately 1 month.  WOC consulted for right leg wound felt to be due to being down for an  extended amount of time with wound care as directed.  Follow-up orthopedic services Dr. Victorino December in regards to right lower extremity compression myonecrosis no weightbearing restrictions.  No orthopedic interventions recommended at this time.  Patient was cleared to begin subcutaneous heparin for DVT prophylaxis.  In regards to patient's right corona radiata left cerebellar CVA recommendation by neurology service for full-strength aspirin.  Therapy evaluations completed due to patient decreased functional mobility was admitted for a comprehensive rehab program.    Patient's medical record from [facility] has been reviewed by the rehabilitation admission coordinator and physician.   Past Medical History      Past Medical History:  Diagnosis Date   Drug abuse ([place])     H/O: rheumatic fever      Diagnosed at 51 y/o.  Resolved at 51 y/o.  States he was diagnosed with heart murmur felt  due to this   Hypertension 2012      Has the patient had major surgery during 100 days prior to admission? No   Family History   family history includes Aneurysm (age of onset: 54) in his brother; COPD in his mother; Cancer in his father; Coronary artery disease in his father; Hypertension in his brother and mother; Lymphoma (age of onset: 69) in his mother.   Current Medications   Current Facility-Administered Medications:    0.9 %  sodium chloride infusion, , Intravenous, Continuous, Elgergawy, Silver Huguenin, MD, Last Rate: 100 mL/hr at 03/05/22 1006, New Bag at 03/05/22 1006   albuterol (PROVENTIL) (2.5 MG/3ML) 0.083% nebulizer solution 2.5 mg, 2.5 mg, Nebulization, Q6H PRN, Elgergawy, Silver Huguenin, MD, 2.5 mg at 03/03/22 1205   amLODipine (NORVASC) tablet 10 mg, 10 mg, Oral, Daily, Elgergawy, Silver Huguenin, MD, 10 mg at 03/05/22 7846   aspirin EC tablet 325 mg, 325 mg, Oral, Daily, Rosalin Hawking, MD, 325 mg at 03/05/22 0834   calcium carbonate (TUMS - dosed in mg elemental calcium) chewable tablet 400 mg of elemental calcium, 2 tablet, Oral, BID WC, Elgergawy, Silver Huguenin, MD, 400 mg of elemental calcium at 03/05/22 0834   Chlorhexidine Gluconate Cloth 2 % PADS 6 each, 6 each, Topical, Daily, Elgergawy, Silver Huguenin, MD, 6 each at 03/05/22 0834   cloNIDine (CATAPRES) tablet 0.2 mg, 0.2 mg, Oral, BID, Elgergawy, Silver Huguenin, MD, 0.2 mg at 03/05/22 0833   heparin injection 5,000 Units, 5,000 Units, Subcutaneous, Q8H, Myles Rosenthal A, MD, 5,000 Units at 03/05/22 1410   hydrALAZINE (APRESOLINE) injection 10 mg, 10 mg, Intravenous, Q4H PRN, Elgergawy, Silver Huguenin, MD, 10 mg at 03/02/22 2055   HYDROmorphone (DILAUDID) injection 1 mg, 1 mg, Intravenous, Q4H PRN, Samuella Cota, MD, 1 mg at 03/05/22 1408   labetalol (NORMODYNE) injection 5 mg, 5 mg, Intravenous, Q2H PRN, Shela Leff, MD, 5 mg at 02/24/22 2046   leptospermum manuka honey (MEDIHONEY) paste 1 Application, 1 Application, Topical, Daily, Elgergawy,  Silver Huguenin, MD, 1 Application at 96/29/52 0946   metoprolol tartrate (LOPRESSOR) tablet 100 mg, 100 mg, Oral, BID, Elgergawy, Silver Huguenin, MD, 100 mg at 03/05/22 0834   naloxone (NARCAN) injection 0.4 mg, 0.4 mg, Intravenous, PRN, Shela Leff, MD   ondansetron (ZOFRAN) tablet 4 mg, 4 mg, Oral, Q6H PRN, 4 mg at 02/27/22 0216 **OR** ondansetron (ZOFRAN) injection 4 mg, 4 mg, Intravenous, Q6H PRN, Clance Boll, MD, 4 mg at 02/26/22 1639   Oral care mouth rinse, 15 mL, Mouth Rinse, PRN, Samuella Cota, MD   oxyCODONE (Oxy IR/ROXICODONE) immediate release  tablet 5-10 mg, 5-10 mg, Oral, Q4H PRN, Samuella Cota, MD, 10 mg at 03/05/22 1221   Patients Current Diet:  Diet Order                  Diet regular Room service appropriate? Yes; Fluid consistency: Thin  Diet effective now                         Precautions / Restrictions Precautions Precautions: Fall Precaution Comments: scrotal edema Other Brace: OT made scrotal sling Restrictions Weight Bearing Restrictions: No    Has the patient had 2 or more falls or a fall with injury in the past year? No   Prior Activity Level Community (5-7x/wk): drives, works, gets out of house daily   Prior Functional Level Self Care: Did the patient need help bathing, dressing, using the toilet or eating? Independent   Indoor Mobility: Did the patient need assistance with walking from room to room (with or without device)? Independent   Stairs: Did the patient need assistance with internal or external stairs (with or without device)? Independent   Functional Cognition: Did the patient need help planning regular tasks such as shopping or remembering to take medications? Independent   Patient Information Are you of Hispanic, Latino/a,or Spanish origin?: A. No, not of Hispanic, Latino/a, or Spanish origin What is your race?: A. White Do you need or want an interpreter to communicate with a doctor or health care staff?: 0. No    Patient's Response To:  Health Literacy and Transportation Is the patient able to respond to health literacy and transportation needs?: Yes Health Literacy - How often do you need to have someone help you when you read instructions, pamphlets, or other written material from your doctor or pharmacy?: Never In the past 12 months, has lack of transportation kept you from medical appointments or from getting medications?: No In the past 12 months, has lack of transportation kept you from meetings, work, or from getting things needed for daily living?: No   Development worker, international aid / [place] Devices/Equipment: None Home Equipment: Grab bars - tub/shower   Prior Device Use: Indicate devices/aids used by the patient prior to current illness, exacerbation or injury? None of the above   Current Functional Level Cognition   Overall Cognitive Status: Within Functional Limits for tasks assessed Orientation Level: Oriented X4 General Comments: Following all commands, increased time but due to pain. very willing to participate but pain is limiting.    Extremity Assessment (includes Sensation/Coordination)   Upper Extremity Assessment: Generalized weakness (4/5)  Lower Extremity Assessment: Defer to PT evaluation RLE Deficits / Details: edematous throughout and held in external rotation with leg elevated on pillows; AAROM limited hip flexion and knee flexion with pt able to activate gluts, quads and calf though tender/painful and tight heel cord     ADLs   Overall ADL's : Needs assistance/impaired Eating/Feeding: Modified independent, Bed level Grooming: Min guard, Sitting, Wash/dry face, Wash/dry hands Upper Body Bathing: Sitting, Minimal assistance Lower Body Bathing: Maximal assistance, +2 for physical assistance, +2 for safety/equipment, Sit to/from stand Upper Body Dressing : Minimal assistance, Sitting Lower Body Dressing: Maximal assistance, +2 for physical assistance, +2 for  safety/equipment, Sit to/from stand Toilet Transfer: Moderate assistance, +2 for physical assistance, +2 for safety/equipment, Rolling walker (2 wheels) Toilet Transfer Details (indicate cue type and reason): Taking side steps with shuffle step toward HOB. Pain limited Toileting- Clothing Manipulation and Hygiene: Total  assistance, Bed level Toileting - Clothing Manipulation Details (indicate cue type and reason): patient with BM at beginning of session Functional mobility during ADLs: Moderate assistance, +2 for physical assistance, +2 for safety/equipment, Rolling walker (2 wheels) General ADL Comments: Session focus on advancing gait and education on scrotal edema     Mobility   Overal bed mobility: Needs Assistance Bed Mobility: Supine to Sit Rolling: Min assist Sidelying to sit: Mod assist Supine to sit: Min assist Sit to supine: Mod assist General bed mobility comments: Min assist to manage RLE due to pain; Incr time for coming to EOB due to pain, mod asssit to return RLE to bed     Transfers   Overall transfer level: Needs assistance Equipment used: Rolling walker (2 wheels) Transfers: Sit to/from Stand Sit to Stand: From elevated surface, Min assist Bed to/from chair/wheelchair/BSC transfer type:: Step pivot Step pivot transfers: Min assist General transfer comment: pt able to bring legs close enough together to get both feet inside frame of RW; required elevated bed due to pain and pt self-limits use of RLE     Ambulation / Gait / Stairs / Wheelchair Mobility   Ambulation/Gait Ambulation/Gait assistance: Herbalist (Feet): 30 Feet Assistive device: Rolling walker (2 wheels) Gait Pattern/deviations: Step-to pattern, Decreased weight shift to right, Decreased stride length, Decreased stance time - right, Antalgic, Trunk flexed, Trendelenburg, Wide base of support General Gait Details: pt guiding RW wihtout assist; using RLE in very light TDWB with cues with more  NWB secondary to incr pain Gait velocity: decr     Posture / Balance Balance Overall balance assessment: Needs assistance Sitting-balance support: Feet supported Sitting balance-Leahy Scale: Fair Standing balance support: Reliant on assistive device for balance, Bilateral upper extremity supported Standing balance-Leahy Scale: Poor Standing balance comment: UE support due to pain     Special needs/care consideration IV NS at 100 ml/hr    Previous Home Environment (from acute therapy documentation) Living Arrangements: Spouse/significant other  Lives With: Spouse Available Help at Discharge: Family, Available 24 hours/day Type of Home: Mobile home Home Layout: One level Home Access: Stairs to enter Entrance Stairs-Rails: Can reach both Entrance Stairs-Number of Steps: 3 Bathroom Shower/Tub: Optometrist: Yes How Accessible: Accessible via walker [place]: No   Discharge Living Setting Plans for Discharge Living Setting: Patient's home Type of Home at Discharge: Mobile home Discharge Home Layout: One level Discharge Home Access: Stairs to enter Entrance Stairs-Rails: Can reach both Entrance Stairs-Number of Steps: 3 Discharge Bathroom Shower/Tub: Tub/shower unit Discharge Bathroom Toilet: Standard Discharge Bathroom Accessibility: Yes How Accessible: Accessible via walker Does the patient have any problems obtaining your medications?: Yes (Describe) (cost)   Social/Family/Support Systems Anticipated Caregiver: Nickholas Goldston, wife Anticipated Caregiver's Contact Information: [phone] Caregiver Availability: 24/7 Discharge Plan Discussed with Primary Caregiver: Yes Is Caregiver In Agreement with Plan?: Yes Does Caregiver/Family have Issues with Lodging/Transportation while Pt is in Rehab?: No   Goals Patient/Family Goal for Rehab: PT/OT supervision goals Expected length of stay: 10-14 days Pt/Family  Agrees to Admission and willing to participate: Yes Program Orientation Provided & Reviewed with Pt/Caregiver Including Roles  & Responsibilities: Yes   Decrease burden of Care through IP rehab admission: NA   Possible need for SNF placement upon discharge: Not anticipated   Patient Condition: I have reviewed medical records from [facility], spoken with CM, and patient and spouse. I met with patient at the bedside and discussed via phone  for inpatient rehabilitation assessment.  Patient will benefit from ongoing PT and OT, can actively participate in 3 hours of therapy a day 5 days of the week, and can make measurable gains during the admission.  Patient will also benefit from the coordinated team approach during an Inpatient Acute Rehabilitation admission.  The patient will receive intensive therapy as well as Rehabilitation physician, nursing, social worker, and care management interventions.  Due to bladder management, safety, skin/wound care, disease management, medication administration, pain management, and patient education the patient requires 24 hour a day rehabilitation nursing.  The patient is currently min to mod assist with mobility and basic ADLs.  Discharge setting and therapy post discharge at home with home health is anticipated.  Patient has agreed to participate in the Acute Inpatient Rehabilitation Program and will admit today.   Preadmission Screen Completed By:  Bethel Born, 03/05/2022 2:17 PM ______________________________________________________________________   Discussed status with Dr. Naaman Plummer and Tressa Busman on 03/05/22 at 1531 and received approval for admission today.   Admission Coordinator:  Bethel Born, CCC-SLP, time 1531/Date 03/05/2022    Assessment/Plan: Diagnosis: Does the need for close, 24 hr/day Medical supervision in concert with the patient's rehab needs make it unreasonable for this patient to be served in a less intensive setting?  Yes Co-Morbidities requiring supervision/potential complications: Acute renal failure due to rhabdomyolysis, pain management, wounds, hypertension, polysubstance abuse, bladder retention, transaminitis, and anemia  Due to bladder management, safety, skin/wound care, disease management, medication administration, pain management, and patient education, does the patient require 24 hr/day rehab nursing? Yes Does the patient require coordinated care of a physician, rehab nurse, PT, OT  to address physical and functional deficits in the context of the above medical diagnosis(es)? Yes Addressing deficits in the following areas: balance, endurance, locomotion, strength, transferring, bowel/bladder control, bathing, dressing, feeding, grooming, toileting, and psychosocial support Can the patient actively participate in an intensive therapy program of at least 3 hrs of therapy 5 days a week? Yes The potential for patient to make measurable gains while on inpatient rehab is good Anticipated functional outcomes upon discharge from inpatient rehab: supervision PT, supervision OT,  Estimated rehab length of stay to reach the above functional goals is: 10-14 days Anticipated discharge destination: To wife's home  10. Overall Rehab/Functional Prognosis: good     MD Signature:   Gertie Gowda, DO 03/05/2022          Revision History

## 2022-03-05 NOTE — Progress Notes (Signed)
Occupational Therapy Treatment Patient Details Name: Franklin Anderson MRN: [id number] DOB: 1971/02/26 Today's Date: 03/05/2022   History of present illness Franklin Anderson is a 51 y.o. 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  Patient was noted to have significant right-sided swelling, pain, CT of right hip was significant for severe muscle inflammation of the hip/gluteal muscle edema, as well he was noted to have acute renal failure, and work-up significant for CVA.   OT comments  Pt progressing towards established OT goals. Motivated to participate in therapy. Pt continues to be limited by pain, decreased balance, and activity tolerance. Pt with continued scrotal edema requiring continued education regarding elevation; repositioning with elevation via folded pillow cases at end of session and bringing sling next to pt to be reapplied with movement. Pt performing transfers with min A this session and donning socks with min-mod A. Pt requiring education regarding AE for LBD of R foot and with education for compensatory techniques to dress L foot while minimizing pressure on scrotum. Performing chair push ups to optimize UB strengthening. Continue to highly recommend AIR to optimize safety and independence with ADL and IADL.    Recommendations for follow up therapy are one component of a multi-disciplinary discharge planning process, led by the attending physician.  Recommendations may be updated based on patient status, additional functional criteria and insurance authorization.    Follow Up Recommendations  Acute inpatient rehab (3hours/day)    Assistance Recommended at Discharge Frequent or constant Supervision/Assistance  Patient  can return home with the following  Help with stairs or ramp for entrance;Assist for transportation;Assistance with cooking/housework;A lot of help with bathing/dressing/bathroom;A lot of help with walking and/or transfers   Equipment Recommendations  Other (comment) (TBD next venue)    Recommendations for Other Services Rehab consult    Precautions / Restrictions Precautions Precautions: Fall Precaution Comments: scrotal edema Required Braces or Orthoses: Other Brace Other Brace: OT made scrotal sling Restrictions Weight Bearing Restrictions: No       Mobility Bed Mobility Overal bed mobility: Needs Assistance Bed Mobility: Sit to Supine       Sit to supine: Min assist   General bed mobility comments: Min A to manage RLE    Transfers Overall transfer level: Needs assistance Equipment used: Rolling walker (2 wheels) Transfers: Sit to/from Stand Sit to Stand: Min assist           General transfer comment: pt able to bring legs close enough together to get both feet inside frame of RW; min assist to power up from recliner. Cues for safety with descent onto bed     Balance Overall balance assessment: Needs assistance Sitting-balance support: Feet supported Sitting balance-Leahy Scale: Fair     Standing balance support: Reliant on assistive device for balance, Bilateral upper extremity supported Standing balance-Leahy Scale: Poor Standing balance comment: UE support due to pain                           ADL either performed or assessed with clinical judgement   ADL Overall ADL's : Needs assistance/impaired                     Lower Body Dressing: Minimal assistance;Cueing  for compensatory techniques;Sitting/lateral leans Lower Body Dressing Details (indicate cue type and reason): Pt requiring max increased time and effort for LB dressing. Introducing AE for dressing R foot and pt requiring min A for use. Pt able to don L sock, but with max  increased time and effort as well as education to avoid pressure to scrotal area with movement of L leg. Toilet Transfer: Minimal assistance;Rolling walker (2 wheels);Regular Toilet;Ambulation Toilet Transfer Details (indicate cue type and reason): simulated in room,         Functional mobility during ADLs: Minimal assistance;Min guard;Rolling walker (2 wheels) General ADL Comments: Min A with fatigue for functional mobility, but initially min guard A after initial stand    Extremity/Trunk Assessment Upper Extremity Assessment Upper Extremity Assessment: Generalized weakness (complaining fo numbness along ulnar nerve distribution.)   Lower Extremity Assessment Lower Extremity Assessment: Defer to PT evaluation        Vision   Vision Assessment?: No apparent visual deficits   Perception     Praxis      Cognition Arousal/Alertness: Awake/alert Behavior During Therapy: WFL for tasks assessed/performed Overall Cognitive Status: Within Functional Limits for tasks assessed                                 General Comments: Following all commands, increased time but due to pain. very willing to participate,a dn expressing gratitude at end of session. Perhaps poor historian as reporting he had same conversation with OT and PT this morning about them having a meeting and PT with no meeting.        Exercises Exercises: General Upper Extremity General Exercises - Upper Extremity Chair Push Up: 5 reps, Seated    Shoulder Instructions       General Comments      Pertinent Vitals/ Pain       Pain Assessment Pain Assessment: Faces Faces Pain Scale: Hurts even more Pain Location: Rt hip and scrotum Pain Descriptors / Indicators: Sharp, Aching, Grimacing, Guarding Pain Intervention(s): Limited activity within patient's tolerance, Premedicated before session, Monitored during session  Home Living                                          Prior  Functioning/Environment              Frequency  Min 2X/week        Progress Toward Goals  OT Goals(current goals can now be found in the care plan section)  Progress towards OT goals: Progressing toward goals  Acute Rehab OT Goals Patient Stated Goal: no pain OT Goal Formulation: With patient Time For Goal Achievement: 03/12/22 Potential to Achieve Goals: Good ADL Goals Pt Will Perform Lower Body Bathing: Independently;sit to/from stand;sitting/lateral leans Pt Will Perform Lower Body Dressing: Independently;sit to/from stand;sitting/lateral leans Pt Will Transfer to Toilet: Independently;ambulating Pt Will Perform Toileting - Clothing Manipulation and hygiene: Independently Additional ADL Goal #1: Patient will be able to verbalize steps to aid in decreased scrotal edema to promote increased independence in ADLs. Additional ADL Goal #2: Patient will be able to complete functional task in standing for 3-5 minutes to promote increased activity tolerance to return to prior level of function.  Plan Discharge plan remains appropriate    Co-evaluation  AM-PAC OT "6 Clicks" Daily Activity     Outcome Measure   Help from another person eating meals?: None Help from another person taking care of personal grooming?: A Little Help from another person toileting, which includes using toliet, bedpan, or urinal?: A Little Help from another person bathing (including washing, rinsing, drying)?: A Lot Help from another person to put on and taking off regular upper body clothing?: A Little Help from another person to put on and taking off regular lower body clothing?: A Lot 6 Click Score: 17    End of Session Equipment Utilized During Treatment: Gait belt;Rolling walker (2 wheels);Other (comment)  OT Visit Diagnosis: Unsteadiness on feet (R26.81);Muscle weakness (generalized) (M62.81);Other abnormalities of gait and mobility (R26.89);Pain Pain - Right/Left:  Right Pain - part of body: Leg;Hip   Activity Tolerance Patient tolerated treatment well;Patient limited by pain   Patient Left in bed;with call bell/phone within reach;with bed alarm set   Nurse Communication Mobility status;Other (comment) (numbness in hands)        Time: 4259-5638 OT Time Calculation (min): 31 min  Charges: OT General Charges $OT Visit: 1 Visit OT Treatments $Self Care/Home Management : 23-37 mins  Ladene Artist, OTR/L [facility] Office: [phone]   Drue Novel 03/05/2022, 5:29 PM
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
## 2022-03-05 NOTE — Progress Notes (Signed)
Physical Therapy Treatment Patient Details Name: Franklin Anderson MRN: [id number] DOB: [birth year] Today's Date: 03/05/2022   History of Present Illness Franklin Anderson is a 51 y.o. 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  Patient was noted to have significant right-sided swelling, pain, CT of right hip was significant for severe muscle inflammation of the hip/gluteal muscle edema, as well he was noted to have acute renal failure, and work-up significant for CVA.    PT Comments    Pt with continued progress towards acute goals, however limited by pain despite premedication. Pt pleasant and motivated throughout session demonstrating increased tolerance for gait training with RW and min guard with cues needed throughout for increased weightbearing through RLE as pt with tendency to default to NWB secondary to pain. Encouraged pt to rest with RLE in neutral rotation with pt verbalizing understanding of importance. Pt continues to benefit from skilled PT services to progress toward functional mobility goals.    Recommendations for follow up therapy are one component of a multi-disciplinary discharge planning process, led by the attending physician.  Recommendations may be updated based on patient status, additional functional criteria and insurance authorization.  Follow Up Recommendations  Acute inpatient rehab (3hours/day)     Assistance Recommended at Discharge Intermittent Supervision/Assistance  Patient can return home with the following Two people to help with bathing/dressing/bathroom;A little help with walking and/or transfers;Help with stairs or ramp for entrance   Equipment Recommendations  BSC/3in1;Rolling walker (2  wheels)    Recommendations for Other Services       Precautions / Restrictions Precautions Precautions: Fall Precaution Comments: scrotal edema Required Braces or Orthoses: Other Brace Other Brace: OT made scrotal sling Restrictions Weight Bearing Restrictions: No     Mobility  Bed Mobility Overal bed mobility: Needs Assistance Bed Mobility: Supine to Sit     Supine to sit: Min assist     General bed mobility comments: Min assist to manage RLE due to pain; Incr time for coming to EOB due to pain,    Transfers Overall transfer level: Needs assistance Equipment used: Rolling walker (2 wheels) Transfers: Sit to/from Stand Sit to Stand: Min assist           General transfer comment: pt able to bring legs close enough together to get both feet inside frame of RW; min assist to power up from bede at lowest height    Ambulation/Gait Ambulation/Gait assistance: Min assist Gait Distance (Feet): 75 Feet Assistive device: Rolling walker (2 wheels) Gait Pattern/deviations: Step-to pattern, Decreased weight shift to right, Decreased stride length, Decreased stance time - right, Antalgic, Trunk flexed, Trendelenburg, Wide base of support Gait velocity: decr     General Gait Details: pt guiding RW wihtout assist; using RLE in TDWB with cues, defaults to NWB secondary to incr pain   Stairs             Wheelchair Mobility    Modified Rankin (Stroke Patients Only) Modified Rankin (Stroke Patients Only) Pre-Morbid Rankin Score: No symptoms Modified Rankin: Moderately severe disability     Balance Overall balance assessment: Needs assistance Sitting-balance support: Feet supported Sitting balance-Leahy Scale: Fair     Standing balance support: Reliant on assistive device for balance, Bilateral upper extremity supported Standing balance-Leahy Scale:  Poor Standing balance comment: UE support due to pain                            Cognition  Arousal/Alertness: Awake/alert Behavior During Therapy: WFL for tasks assessed/performed Overall Cognitive Status: Within Functional Limits for tasks assessed                                 General Comments: Following all commands, increased time but due to pain. very willing to participate        Exercises General Exercises - Lower Extremity Quad Sets: AROM, 5 reps, Right, Supine Other Exercises Other Exercises: long axis IR/ER x5 with continued education and encouragement for neutral positioning    General Comments        Pertinent Vitals/Pain Pain Assessment Pain Assessment: Faces Faces Pain Scale: Hurts even more Pain Location: Rt hip and scrotum Pain Descriptors / Indicators: Sharp, Aching, Grimacing, Guarding Pain Intervention(s): Premedicated before session, Monitored during session, Limited activity within patient's tolerance, Repositioned    Home Living                          Prior Function            PT Goals (current goals can now be found in the care plan section) Acute Rehab PT Goals Patient Stated Goal: to return to independent PT Goal Formulation: With patient Time For Goal Achievement: 03/12/22    Frequency    Min 4X/week      PT Plan Current plan remains appropriate    Co-evaluation              AM-PAC PT "6 Clicks" Mobility   Outcome Measure  Help needed turning from your back to your side while in a flat bed without using bedrails?: A Little Help needed moving from lying on your back to sitting on the side of a flat bed without using bedrails?: A Little Help needed moving to and from a bed to a chair (including a wheelchair)?: A Lot Help needed standing up from a chair using your arms (e.g., wheelchair or bedside chair)?: A Lot Help needed to walk in hospital room?: A Little Help needed climbing 3-5 steps with a railing? : Total 6 Click Score: 14    End of Session Equipment Utilized During Treatment:  Gait belt Activity Tolerance: Patient tolerated treatment well Patient left: with call bell/phone within reach;in chair;with chair alarm set Nurse Communication: Mobility status PT Visit Diagnosis: Difficulty in walking, not elsewhere classified (R26.2);Pain;History of falling (Z91.81) Pain - Right/Left: Right Pain - part of body: Hip     Time: 7989-2119 PT Time Calculation (min) (ACUTE ONLY): 28 min  Charges:  $Gait Training: 8-22 mins $Therapeutic Exercise: 8-22 mins                     Elexia Friedt R. PTA Acute Rehabilitation Services Office: [phone]    Catalina Antigua 03/05/2022, 2:47 PM

## 2022-03-05 NOTE — Discharge Summary (Addendum)
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 Encourage him to be actively moving the ankle and knee while in bed to improve for treatment of muscle motor end units.     Elevated LFTS: -Most likely due to above, trending down. 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  Marland Kitchen  HCV RNA is elevated, ID to schedule an appointment in 1 month.  Dr. Johnette Abraham discussed with his wife as well after obtained his permission, and notified her of above finding and she will follow with her PCP to get tested as well.      Discharge Diagnoses: Principal Problem:   AKI (acute kidney injury) ([place]) Active Problems:   Uncontrolled hypertension   Polysubstance abuse ([place])   Change in mental status   Rhabdomyolysis   History of CVA (cerebrovascular accident)   Acute CVA (cerebrovascular accident) ([place])   Acute metabolic encephalopathy  Resolved Problems:   * No resolved hospital problems. *  Hospital Course: 51 year old man presented after being found down at home with significant right-sided swelling and pain, CT right hip showed severe muscle inflammation and work-up revealed acute renal failure as well as CVA.  Patient gradually improved, plan for CIR.  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 --Creatinine trending down --remove Foley catheter  Acute left cerebellum and right insular cortex and CR infarcts, embolic pattern, could be related to substance use vs. xevere rhabdomyolysis    These are likely secondary to a central embolic etiology. No hemorrhage or mass effect  --no s/s of infection --TEE unrevealing, continue ASA, await respond from neurology on  DAPT   Severe muscle inflammation of right hip and gluteal muscle/crush injury Right lower extremity rhabdomyolysis Right lower extremity compression myonecrosis --in the setting of cocaine use, as well possibly prolonged immobility --No orthopedic surgery per orthopedics --Right lower extremity movement started to improve, upon presentation -No weightbearing or range of motion restrictions. Encourage him to be actively moving the ankle and knee while in bed to improve for treatment of muscle motor end units.    Acute metabolic  encephalopathy  --resolved, secondary to polysubstance abuse and acute CVA .  Polysubstance abuse - CIWA protocol, no s/s of withdrawal --UDS on admission 10/30 positive for cocaine, BZD, amphetamines  -Patient denied IV drug abuse, no IV drug abuse since 2020.   Hyperkalemia, secondary to AKI -Resolved   Hypocalcemia -Repleted, continue to monitor, will decrease supplements to twice daily   Essential hypertension -Improved after increasing his clonidine and metoprolol, continue with amlodipine and as needed hydralazine       Elevated LFTS: -Most likely due to above, trending down. 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  Marland Kitchen  HCV RNA is elevated, ID to schedule an appointment in 1 month.  Dr. Johnette Abraham discussed with his wife as well  after obtained his permission, and notified her of above finding and she will follow with her PCP to get tested as well.      Right outer calf pressure ulcer -Initially bruise on admission, progressed to full-thickness  pressure ulcer --Apply Medihoney to right leg wound Q day, then cover with foam dressing.  (Change foam dressing Q 3 days or PRN soiling.)   Consultants:  Neurology Orthopedics    Procedures performed:  TEE KEY FINDINGS:   EF normal, No PFO, No left atrial appendage clot, atherosclerotic plaque in the descending aorta.  Full report to follow. Further management per primary team  Disposition:  CIR Diet recommendation:  Regular diet DISCHARGE MEDICATION: Allergies as of 03/05/2022       Reactions   Sulfa Antibiotics Anaphylaxis   Penicillins Other (See Comments)   "Near death" reaction per pt 03/17/22, could not elaborate.        Medication List     STOP taking these medications    BC HEADACHE POWDER PO   busPIRone 10 MG tablet Commonly known as: BUSPAR   hydrOXYzine 25 MG tablet Commonly known as: ATARAX   ibuprofen 200 MG tablet Commonly known as: ADVIL   lidocaine 5 % Commonly known as: LIDODERM   losartan 50 MG tablet Commonly known as: COZAAR   naproxen 500 MG tablet Commonly known as: NAPROSYN   traZODone 50 MG tablet Commonly known as: DESYREL       TAKE these medications    amLODipine 10 MG tablet Commonly known as: NORVASC Take 1 tablet (10 mg total) by mouth daily. Start taking on: March 06, 2022   aspirin 81 MG chewable tablet Chew 1 tablet (81 mg total) by mouth daily.   cloNIDine 0.2 MG tablet Commonly known as: CATAPRES Take 1 tablet (0.2 mg total) by mouth 2 (two) times daily.   clopidogrel 75 MG tablet Commonly known as: Plavix Take 1 tablet (75 mg total) by mouth daily. Total 21 days then stop 11/30   leptospermum manuka honey Pste paste Apply 1 Application topically daily.   metoprolol tartrate 100 MG tablet Commonly known as: LOPRESSOR Take 1 tablet (100 mg total) by mouth 2 (two) times daily. What changed:  medication strength how much to take   oxyCODONE 5 MG immediate release tablet Commonly known as: Oxy  IR/ROXICODONE Take 1-2 tablets (5-10 mg total) by mouth every 4 (four) hours as needed for moderate pain or severe pain.               Discharge Care Instructions  (From admission, onward)           Start     Ordered   03/05/22 0000  Discharge wound care:       Comments: Apply Medihoney to right leg wound Q day, then cover with foam dressing.  (Change foam dressing Q 3 days or PRN soiling.)   03/05/22 1540            Follow-up Information     [facility]. Schedule an appointment as soon as possible for a visit in 1 month(s).   Specialty: Neurology Why: stroke clinic Contact information: [address] [place] [phone]        Antony Blackbird, MD. Schedule an appointment as soon as possible for a visit in 2 week(s).   Specialty: Family Medicine Contact information: [address] [place] [address] [phone]  Feels better Still has right leg pain   Discharge Exam: Filed Weights   03/03/22 0500 03/04/22 0500 03/05/22 0500  Weight: 73.9 kg 73.9 kg 71 kg   Physical Exam Vitals reviewed.  Constitutional:      General: He is not in acute distress. Cardiovascular:     Rate and Rhythm: Normal rate and regular rhythm.     Heart sounds: No murmur heard. Pulmonary:     Effort: Pulmonary effort is normal. No respiratory distress.     Breath sounds: No wheezing, rhonchi or rales.  Neurological:     Mental Status: He is alert.  Psychiatric:        Behavior: Behavior normal.    Creatinine down to 3.9 ASt down tto 90 ALT down to 137 CK down to 2338  Condition at discharge: good  The results of significant diagnostics from this hospitalization (including imaging, microbiology, ancillary and laboratory) are listed below for reference.   Imaging Studies: ECHO TEE  Result Date: 03/04/2022    TRANSESOPHOGEAL ECHO REPORT   Patient Name:   MATTINGLY KILKENNY Date of Exam:  03/04/2022 Medical Rec #:  [id number]      Height:       72.0 in Accession #:    [id number]     Weight:       162.9 lb Date of Birth:  1970/12/22     BSA:          1.953 m Patient Age:    75 years       BP:           149/105 mmHg Patient Gender: M              HR:           85 bpm. Exam Location:  Inpatient Procedure: Transesophageal Echo, Color Doppler and Cardiac Doppler Indications:     Stroke i63.9  History:         Patient has prior history of Echocardiogram examinations, most                  recent 02/25/2022. Risk Factors:Hypertension and Polysubstance                  Abuse.  Sonographer:     Raquel Sarna Senior RDCS Referring Phys:  Cecilie Kicks, R Diagnosing Phys: Berniece Salines DO PROCEDURE: After discussion of the risks and benefits of a TEE, an informed consent was obtained from the patient. The transesophogeal probe was passed without difficulty through the esophogus of the patient. Sedation performed by different physician. The patient was monitored while under deep sedation. Anesthestetic sedation was provided intravenously by Anesthesiology: 142mg  of Propofol, 60mg  of Lidocaine. The patient's vital signs; including heart rate, blood pressure, and oxygen saturation; remained stable throughout the procedure. The patient developed no complications during the procedure.  IMPRESSIONS  1. Left ventricular ejection fraction, by estimation, is 55 to 60%. The left ventricle has normal function. The left ventricle has no regional wall motion abnormalities.  2. Right ventricular systolic function is normal. The right ventricular size is normal.  3. No left atrial/left atrial appendage thrombus was detected. The LAA emptying velocity was 96 cm/s.  4. The mitral valve is normal in structure. No evidence of mitral valve regurgitation. No evidence of mitral stenosis.  5. The aortic valve is normal in structure. Aortic valve regurgitation is trivial. No aortic stenosis is present.  6. There is Moderate (Grade III) layered  and protruding plaque involving the  descending aorta and aortic arch.  7. The inferior vena cava is normal in size with greater than 50% respiratory variability, suggesting right atrial pressure of 3 mmHg.  8. Agitated saline contrast bubble study was negative, with no evidence of any interatrial shunt. Conclusion(s)/Recommendation(s): Normal biventricular function without evidence of hemodynamically significant valvular heart disease. FINDINGS  Left Ventricle: Left ventricular ejection fraction, by estimation, is 55 to 60%. The left ventricle has normal function. The left ventricle has no regional wall motion abnormalities. The left ventricular internal cavity size was normal in size. There is  no left ventricular hypertrophy. Right Ventricle: The right ventricular size is normal. No increase in right ventricular wall thickness. Right ventricular systolic function is normal. Left Atrium: Left atrial size was normal in size. No left atrial/left atrial appendage thrombus was detected. The LAA emptying velocity was 96 cm/s. Right Atrium: Right atrial size was normal in size. Pericardium: There is no evidence of pericardial effusion. Mitral Valve: The mitral valve is normal in structure. No evidence of mitral valve regurgitation. No evidence of mitral valve stenosis. Tricuspid Valve: The tricuspid valve is normal in structure. Tricuspid valve regurgitation is trivial. No evidence of tricuspid stenosis. Aortic Valve: The aortic valve is normal in structure. Aortic valve regurgitation is trivial. No aortic stenosis is present. Pulmonic Valve: The pulmonic valve was normal in structure. Pulmonic valve regurgitation is not visualized. No evidence of pulmonic stenosis. Aorta: The aortic root is normal in size and structure. There is moderate (Grade III) layered and protruding plaque involving the descending aorta and aortic arch. Venous: The left upper pulmonary vein, left lower pulmonary vein, right upper pulmonary vein  and right lower pulmonary vein are normal. A normal flow pattern is recorded from the left upper pulmonary vein. The inferior vena cava is normal in size with greater than 50% respiratory variability, suggesting right atrial pressure of 3 mmHg. IAS/Shunts: No atrial level shunt detected by color flow Doppler. Agitated saline contrast bubble study was negative, with no evidence of any interatrial shunt. Additional Comments: Spectral Doppler performed. Berniece Salines DO Electronically signed by Berniece Salines DO Signature Date/Time: 03/04/2022/2:09:33 PM    Final    VAS US CAROTID  Result Date: 02/26/2022 Carotid Arterial Duplex Study Patient Name:  DONNAVIN BELFLOWER  Date of Exam:   02/25/2022 Medical Rec #: [id number]       Accession #:    [id number] Date of Birth: 03/22/1971      Patient Gender: M Patient Age:   33 years Exam Location:  [facility] Procedure:      VAS US CAROTID Referring Phys: Cornelius Moras XU --------------------------------------------------------------------------------  Indications:       CVA. Risk Factors:      Hypertension. Comparison Study:  no prior Performing Technologist: Archie Patten RVS  Examination Guidelines: A complete evaluation includes B-mode imaging, spectral Doppler, color Doppler, and power Doppler as needed of all accessible portions of each vessel. Bilateral testing is considered an integral part of a complete examination. Limited examinations for reoccurring indications may be performed as noted.  Right Carotid Findings: +----------+--------+--------+--------+------------------+--------+           PSV cm/sEDV cm/sStenosisPlaque DescriptionComments +----------+--------+--------+--------+------------------+--------+ CCA Prox  90      29              heterogenous               +----------+--------+--------+--------+------------------+--------+ CCA Distal57      24  heterogenous                +----------+--------+--------+--------+------------------+--------+ ICA Prox  53      21              heterogenous               +----------+--------+--------+--------+------------------+--------+ ICA Distal71      30                                         +----------+--------+--------+--------+------------------+--------+ ECA       81      27                                         +----------+--------+--------+--------+------------------+--------+ +----------+--------+-------+--------+-------------------+           PSV cm/sEDV cmsDescribeArm Pressure (mmHG) +----------+--------+-------+--------+-------------------+ Subclavian89                                         +----------+--------+-------+--------+-------------------+ +---------+--------+--+--------+--+---------+ VertebralPSV cm/s41EDV cm/s18Antegrade +---------+--------+--+--------+--+---------+  Left Carotid Findings: +----------+--------+--------+--------+------------------+--------+           PSV cm/sEDV cm/sStenosisPlaque DescriptionComments +----------+--------+--------+--------+------------------+--------+ CCA Prox  93      29              heterogenous               +----------+--------+--------+--------+------------------+--------+ CCA Distal73      27              heterogenous               +----------+--------+--------+--------+------------------+--------+ ICA Prox  64      35              heterogenous               +----------+--------+--------+--------+------------------+--------+ ICA Distal83      40                                         +----------+--------+--------+--------+------------------+--------+ ECA       91      23                                         +----------+--------+--------+--------+------------------+--------+ +----------+--------+--------+--------+-------------------+           PSV cm/sEDV cm/sDescribeArm Pressure (mmHG)  +----------+--------+--------+--------+-------------------+ [id number]                                          +----------+--------+--------+--------+-------------------+ +---------+--------+--+--------+--+---------+ VertebralPSV cm/s35EDV cm/s10Antegrade +---------+--------+--+--------+--+---------+   Summary: Right Carotid: The extracranial vessels were near-normal with only minimal wall                thickening or plaque. Left Carotid: The extracranial vessels were near-normal with only minimal wall               thickening or plaque. Vertebrals: Bilateral vertebral arteries demonstrate antegrade flow. *See table(s) above for  measurements and observations.  Electronically signed by Antony Contras MD on 02/26/2022 at 8:04:03 AM.    Final    ECHOCARDIOGRAM COMPLETE  Result Date: 02/25/2022    ECHOCARDIOGRAM REPORT   Patient Name:   OLAJIDE BARGERSTOCK Pavlovich Date of Exam: 02/25/2022 Medical Rec #:  [id number]      Height:       72.0 in Accession #:    [id number]     Weight:       125.0 lb Date of Birth:  29-Aug-1970     BSA:          1.745 m Patient Age:    78 years       BP:           174/116 mmHg Patient Gender: M              HR:           83 bpm. Exam Location:  Inpatient Procedure: 2D Echo, Color Doppler and Cardiac Doppler Indications:    Stroke  History:        Patient has no prior history of Echocardiogram examinations.                 Signs/Symptoms:Murmur; Risk Factors:Hypertension, Family History                 of Coronary Artery Disease and Current Smoker. Polysubstance                 abuse, rheumatic fever (as a child).  Sonographer:    Eartha Inch Referring Phys: [id number]  Sonographer Comments: Image acquisition challenging due to patient body habitus and Image acquisition challenging due to respiratory motion. IMPRESSIONS  1. Left ventricular ejection fraction, by estimation, is 55%. The left ventricle has normal function. The left ventricle has no regional wall motion  abnormalities. There is mild concentric left ventricular hypertrophy. Left ventricular diastolic parameters are consistent with Grade I diastolic dysfunction (impaired relaxation).  2. Right ventricular systolic function is normal. The right ventricular size is normal. Tricuspid regurgitation signal is inadequate for assessing PA pressure.  3. The mitral valve is normal in structure. Trivial mitral valve regurgitation. No evidence of mitral stenosis.  4. The aortic valve is tricuspid. Aortic valve regurgitation is trivial. No aortic stenosis is present.  5. The inferior vena cava is normal in size with greater than 50% respiratory variability, suggesting right atrial pressure of 3 mmHg. FINDINGS  Left Ventricle: Left ventricular ejection fraction, by estimation, is 55%. The left ventricle has normal function. The left ventricle has no regional wall motion abnormalities. The left ventricular internal cavity size was normal in size. There is mild concentric left ventricular hypertrophy. Left ventricular diastolic parameters are consistent with Grade I diastolic dysfunction (impaired relaxation). Right Ventricle: The right ventricular size is normal. No increase in right ventricular wall thickness. Right ventricular systolic function is normal. Tricuspid regurgitation signal is inadequate for assessing PA pressure. Left Atrium: Left atrial size was normal in size. Right Atrium: Right atrial size was normal in size. Pericardium: Trivial pericardial effusion is present. Mitral Valve: The mitral valve is normal in structure. Trivial mitral valve regurgitation. No evidence of mitral valve stenosis. MV peak gradient, 4.0 mmHg. The mean mitral valve gradient is 2.0 mmHg. Tricuspid Valve: The tricuspid valve is normal in structure. Tricuspid valve regurgitation is not demonstrated. Aortic Valve: The aortic valve is tricuspid. Aortic valve regurgitation is trivial. No aortic stenosis is present. Pulmonic Valve: The pulmonic  valve was normal in structure. Pulmonic valve regurgitation is not visualized. Aorta: The aortic root is normal in size and structure. Venous: The inferior vena cava is normal in size with greater than 50% respiratory variability, suggesting right atrial pressure of 3 mmHg. IAS/Shunts: No atrial level shunt detected by color flow Doppler.  LEFT VENTRICLE PLAX 2D LVIDd:         4.50 cm      Diastology LVIDs:         4.00 cm      LV e' medial:    8.55 cm/s LV PW:         1.20 cm      LV E/e' medial:  7.8 LV IVS:        0.90 cm      LV e' lateral:   8.86 cm/s LVOT diam:     2.10 cm      LV E/e' lateral: 7.5 LV SV:         67 LV SV Index:   38 LVOT Area:     3.46 cm  LV Volumes (MOD) LV vol d, MOD A2C: 93.4 ml LV vol d, MOD A4C: 149.0 ml LV vol s, MOD A2C: 52.3 ml LV vol s, MOD A4C: 84.5 ml LV SV MOD A2C:     41.1 ml LV SV MOD A4C:     149.0 ml LV SV MOD BP:      48.2 ml RIGHT VENTRICLE             IVC RV S prime:     15.50 cm/s  IVC diam: 2.00 cm TAPSE (M-mode): 1.8 cm LEFT ATRIUM             Index        RIGHT ATRIUM           Index LA diam:        2.90 cm 1.66 cm/m   RA Area:     13.90 cm LA Vol (A2C):   63.8 ml 36.57 ml/m  RA Volume:   34.50 ml  19.77 ml/m LA Vol (A4C):   28.5 ml 16.33 ml/m LA Biplane Vol: 43.1 ml 24.70 ml/m  AORTIC VALVE LVOT Vmax:   132.00 cm/s LVOT Vmean:  87.200 cm/s LVOT VTI:    0.193 m  AORTA Ao Root diam: 2.90 cm Ao Asc diam:  3.20 cm MITRAL VALVE MV Area (PHT): 3.06 cm    SHUNTS MV Area VTI:   2.88 cm    Systemic VTI:  0.19 m MV Peak grad:  4.0 mmHg    Systemic Diam: 2.10 cm MV Mean grad:  2.0 mmHg MV Vmax:       1.00 m/s MV Vmean:      64.2 cm/s MV Decel Time: 248 msec MV E velocity: 66.70 cm/s MV A velocity: 68.90 cm/s MV E/A ratio:  0.97 Dalton McleanMD Electronically signed by Franki Monte Signature Date/Time: 02/25/2022/3:04:29 PM    Final    VAS [place] LOWER EXTREMITY VENOUS (DVT)  Result Date: 02/25/2022  Lower Venous DVT Study Patient Name:  PARNELL CAEZ  Date of Exam:    02/25/2022 Medical Rec #: [id number]       Accession #:    [id number] Date of Birth: Jun 04, 1970      Patient Gender: M Patient Age:   43 years Exam Location:  [facility] Procedure:      VAS [place] LOWER EXTREMITY VENOUS (DVT) Referring Phys: Cornelius Moras XU --------------------------------------------------------------------------------  Indications: Stroke.  Comparison Study: prior 03/01/19 Performing Technologist: [place] Ponder RVS  Examination Guidelines: A complete evaluation includes B-mode imaging, spectral Doppler, color Doppler, and power Doppler as needed of all accessible portions of each vessel. Bilateral testing is considered an integral part of a complete examination. Limited examinations for reoccurring indications may be performed as noted. The reflux portion of the exam is performed with the patient in reverse Trendelenburg.  +--------+---------------+---------+-----------+----------+--------------------+ RIGHT   CompressibilityPhasicitySpontaneityPropertiesThrombus Aging       +--------+---------------+---------+-----------+----------+--------------------+ CFV     Full           Yes      Yes                                       +--------+---------------+---------+-----------+----------+--------------------+ SFJ     Full                                                              +--------+---------------+---------+-----------+----------+--------------------+ FV Prox Full                                                              +--------+---------------+---------+-----------+----------+--------------------+ FV Mid  Full           Yes      Yes                                       +--------+---------------+---------+-----------+----------+--------------------+ FV      Full                                                              Distal                                                                     +--------+---------------+---------+-----------+----------+--------------------+ PFV     Full                                                              +--------+---------------+---------+-----------+----------+--------------------+ POP     Full           Yes      Yes                                       +--------+---------------+---------+-----------+----------+--------------------+  PTV     Full           Yes      Yes                  patent by color                                                           doppler              +--------+---------------+---------+-----------+----------+--------------------+ PERO    Full           Yes      Yes                  patent by color                                                           doppler              +--------+---------------+---------+-----------+----------+--------------------+   +---------+---------------+---------+-----------+----------+--------------+ LEFT     CompressibilityPhasicitySpontaneityPropertiesThrombus Aging +---------+---------------+---------+-----------+----------+--------------+ CFV      Full           Yes      Yes                                 +---------+---------------+---------+-----------+----------+--------------+ SFJ      Full                                                        +---------+---------------+---------+-----------+----------+--------------+ FV Prox  Full                                                        +---------+---------------+---------+-----------+----------+--------------+ FV Mid   Full                                                        +---------+---------------+---------+-----------+----------+--------------+ FV DistalFull                                                        +---------+---------------+---------+-----------+----------+--------------+ PFV      Full                                                         +---------+---------------+---------+-----------+----------+--------------+  POP      Full           Yes      Yes                                 +---------+---------------+---------+-----------+----------+--------------+ PTV      Full                                                        +---------+---------------+---------+-----------+----------+--------------+ PERO     Full                                                        +---------+---------------+---------+-----------+----------+--------------+     Summary: BILATERAL: - No evidence of deep vein thrombosis seen in the lower extremities, bilaterally. -No evidence of popliteal cyst, bilaterally.   *See table(s) above for measurements and observations. Electronically signed by Monica Martinez MD on 02/25/2022 at 12:07:47 PM.    Final    MR ANGIO HEAD WO CONTRAST  Result Date: 02/25/2022 CLINICAL DATA:  Stroke, follow up EXAM: MRA HEAD WITHOUT CONTRAST TECHNIQUE: Angiographic images of the Circle of Willis were acquired using MRA technique without intravenous contrast. COMPARISON:  MRI head February 24, 2022. FINDINGS: Anterior circulation: Bilateral intracranial ICAs, MCAs, and ACAs are patent without proximal hemodynamically significant stenosis. Posterior circulation: Bilateral intradural vertebral arteries, basilar artery and bilateral posterior cerebral arteries are patent without proximal hemodynamically significant stenosis. Left fetal type PCA, anatomic variant. IMPRESSION: No emergent large vessel occlusion or proximal hemodynamically significant stenosis. Electronically Signed   By: Margaretha Sheffield M.D.   On: 02/25/2022 09:01   MR BRAIN WO CONTRAST  Result Date: 02/24/2022 CLINICAL DATA:  Altered mental status.  Found down. EXAM: MRI HEAD WITHOUT CONTRAST TECHNIQUE: Multiplanar, multiecho pulse sequences of the brain and surrounding structures were obtained without intravenous contrast. 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 No evidence of hemorrhage. No extra-axial fluid collection. No hydrocephalus. 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 Vascular: Normal flow voids. Skull and upper cervical spine: Normal marrow signal. Sinuses/Orbits: Negative. Other: There is a soft tissue hematoma along the midline frontal scalp. IMPRESSION: 1. 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 No hemorrhage or mass effect. 2. Soft tissue hematoma along the midline frontal scalp. Electronically Signed   By: Marin Roberts M.D.   On: 02/24/2022 10:10   CT EXTREMITY LOWER RIGHT WO CONTRAST  Addendum Date: 02/24/2022   ADDENDUM REPORT: 02/24/2022 03:59 ADDENDUM: Discussed over the phone in detail with Dr. Marcello Moores at 3:51 a.m., 02/24/2022, with verbal acknowledgement of the key findings. Electronically Signed   By: Telford Nab M.D.   On: 02/24/2022 03:59   Result Date: 02/24/2022 CLINICAL DATA:  Severe rhabdomyolysis. Evaluate for compartment syndrome. EXAM: CT OF THE LOWER RIGHT EXTREMITY WITHOUT CONTRAST TECHNIQUE: Multidetector CT imaging of the entire right lower extremity was performed according to  the standard protocol. RADIATION DOSE REDUCTION: This exam was performed according to the departmental dose-optimization program which includes automated exposure control, adjustment of the mA and/or kV according to patient size and/or use of iterative reconstruction technique. COMPARISON:  Limited comparison is available with a right hip CT from 03/31/2019. FINDINGS: Bones/Joint/Cartilage Normal bone mineralization. There is no evidence of fractures or aggressive bone lesions and no findings of right femoral head avascular necrosis. There are mild features of nonerosive degenerative arthrosis of the hip  joint and the lateral patellofemoral joint. No significant ankle or foot arthropathy is seen. No evidence of joint effusions or hemarthrosis. There is a small accessory navicular bone incidentally noted as well as a tiny os peroneum alongside the cuboid bone. Ligaments Suboptimally assessed by CT. Muscles and Tendons There is diffuse edema and swelling of the right gluteus medius muscle, the dorsal hip adductors in the proximal thigh and the distal aspect of the vastus medialis. The foreleg and plantar foot musculature are unremarkable without contrast. Area tendons are grossly intact, as visualized. Soft tissues No mass, soft tissue gas or localizing fluid collection is seen. There is scattered nonlocalizing fluid in the dorsal proximal thigh between the muscle groups. There is mild superficial edema in the thigh. IMPRESSION: 1. Diffuse edema and swelling of the right gluteus medius muscle, the dorsal hip adductors in the proximal thigh, and the distal aspect of the vastus medialis. 2. 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 3. No evidence of fractures or aggressive bone lesions. 4. Mild nonerosive degenerative changes of the right hip and lateral patellofemoral joint. Electronically Signed: By: Almira Bar M.D. On: 02/24/2022 02:26   CT Extrem Up Entire Arm R WO/CM  Addendum Date: 02/24/2022   ADDENDUM REPORT: 02/24/2022 02:27 ADDENDUM: Not mentioned above, there are coarse linear scar-like opacities in the right lung apex and right lower lung field. There is a 4 mm chronic nodule in right lower lung field which was seen on a 2008 abdomen pelvis CT and is unchanged. Electronically Signed   By: Almira Bar M.D.   On: 02/24/2022 02:27   Result Date: 02/24/2022 CLINICAL DATA:  Severe rhabdomyolysis. Evaluate for compartment syndrome findings. EXAM: CT OF THE UPPER RIGHT EXTREMITY WITHOUT  CONTRAST TECHNIQUE: Multidetector CT imaging of the upper right extremity was performed according to the standard protocol. RADIATION DOSE REDUCTION: This exam was performed according to the departmental dose-optimization program which includes automated exposure control, adjustment of the mA and/or kV according to patient size and/or use of iterative reconstruction technique. COMPARISON:  None Available. FINDINGS: Bones/Joint/Cartilage There is normal bone mineralization without acute fractures. There is a cortical depression in the posterosuperior right humeral head suggesting an age-indeterminate Hill-Sachs impaction deformity such as due to an old anterior shoulder dislocation. 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 There is no evidence of joint effusions or hemarthrosis. No destructive bone lesion or aggressive periostitis is seen in the right upper extremity. Ligaments Suboptimally assessed by CT. Muscles and Tendons There are considerable beam hardening artifacts through the upper extremity due to the study having been performed with his arm by his side. This limits fine detail regarding the muscles and tendons. There is a grossly normal muscle bulk for the patient's age. Whether or not there are tendon abnormalities is indeterminate. Soft tissues No soft tissue gas or fluid collections are  identified. IMPRESSION: 1. No soft tissue gas or focal fluid collections are identified. Absence of these findings however, should not delay surgical exploration if there is strong clinical concern for compartment syndrome with necrotizing fasciitis. 2. Cortical depression in the posterosuperior right humeral head suggesting an age-indeterminate Hill-Sachs impaction deformity such as due to an old anterior shoulder dislocation. 3. 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 No evidence of joint effusions or  hemarthrosis. 4. No destructive bone lesion or aggressive periostitis in the right upper extremity. 5. Significant beam hardening artifacts through the upper extremity due to technique. This limits fine detail regarding the muscles and tendons. Electronically Signed: By: Telford Nab M.D. On: 02/24/2022 02:03   DG Pelvis 1-2 Views  Result Date: 02/23/2022 CLINICAL DATA:  Fall. EXAM: PELVIS - 1-2 VIEW COMPARISON:  None Available. FINDINGS: There is no evidence of pelvic fracture or diastasis. No pelvic bone lesions are seen. IMPRESSION: Negative. Electronically Signed   By: Ronney Asters M.D.   On: 02/23/2022 21:41   DG Chest Portable 1 View  Result Date: 02/23/2022 CLINICAL DATA:  Fall EXAM: PORTABLE CHEST 1 VIEW COMPARISON:  02/06/2022 FINDINGS: Heart and mediastinal contours are within normal limits. No focal opacities or effusions. No acute bony abnormality. IMPRESSION: No active disease. Electronically Signed   By: Rolm Baptise M.D.   On: 02/23/2022 21:11   CT HEAD CODE STROKE WO CONTRAST  Result Date: 02/23/2022 CLINICAL DATA:  Code stroke. Initial evaluation for stroke, right-sided deficits. EXAM: CT HEAD WITHOUT CONTRAST TECHNIQUE: Contiguous axial images were obtained from the base of the skull through the vertex without intravenous contrast. RADIATION DOSE REDUCTION: This exam was performed according to the departmental dose-optimization program which includes automated exposure control, adjustment of the mA and/or kV according to patient size and/or use of iterative reconstruction technique. COMPARISON:  Prior CT from 02/22/2019. FINDINGS: Brain: Cerebral volume within normal limits for age. 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 No acute large vessel territory infarct. No acute intracranial hemorrhage. No mass lesion or midline shift. No hydrocephalus or extra-axial fluid collection. Vascular: No asymmetric hyperdense vessel.  Skull: Focal soft tissue thickening/swelling present at the central forehead, indeterminate (series 5, image 22). Calvarium intact. Sinuses/Orbits: Globes orbital soft tissues within normal limits. Paranasal sinuses and mastoid air cells are largely clear. Other: None. ASPECTS [facility] Stroke Program Early CT Score) - Ganglionic level infarction (caudate, lentiform nuclei, internal capsule, insula, M1-M3 cortex): 7 - Supraganglionic infarction (M4-M6 cortex): 3 Total score (0-10 with 10 being normal): 10 IMPRESSION: 1. No acute intracranial abnormality. 2. ASPECTS is 10. 3. 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 4. Focal soft tissue thickening and/or swelling at the central forehead. Correlation with physical exam recommended. These results were communicated to Dr. Rory Percy at 8:42 pm on 02/23/2022 by text page via the [facility] messaging system. Electronically Signed   By: Jeannine Boga M.D.   On: 02/23/2022 20:45   DG Chest Portable 1 View  Result Date: 02/07/2022 CLINICAL DATA:  Pain with cough. Heard a pop in the right chest this morning. EXAM: PORTABLE CHEST 1 VIEW COMPARISON:  01/15/2022 FINDINGS: Heart size and pulmonary vascularity are normal. Emphysematous changes in the lungs with scattered fibrosis. No airspace disease or consolidation. No pleural effusions. No pneumothorax. Mediastinal contours appear intact. Visualized ribs are nondisplaced. IMPRESSION: Emphysematous changes and fibrosis in  the lungs. No evidence of active pulmonary disease. Electronically Signed   By: Lucienne Capers M.D.   On: 02/07/2022 00:28    Microbiology: Results for orders placed or performed during the hospital encounter of 02/23/22  Urine Culture     Status: None   Collection Time: 02/23/22 11:23 PM   Specimen: Urine, Clean Catch  Result Value Ref Range Status   Specimen Description URINE, CLEAN CATCH  Final   Special Requests NONE  Final    Culture   Final    NO GROWTH Performed at [facility], [place] [address], [place], [place] [address]    Report Status 02/25/2022 FINAL  Final  Culture, blood (Routine X 2) w Reflex to ID Panel     Status: None   Collection Time: 02/24/22  3:38 AM   Specimen: BLOOD LEFT HAND  Result Value Ref Range Status   Specimen Description BLOOD LEFT HAND  Final   Special Requests   Final    BOTTLES DRAWN AEROBIC ONLY Blood Culture results may not be optimal due to an inadequate volume of blood received in culture bottles   Culture   Final    NO GROWTH 5 DAYS Performed at [facility], [place] [address], [place], [place] [address]    Report Status 03/01/2022 FINAL  Final  Culture, blood (Routine X 2) w Reflex to ID Panel     Status: None   Collection Time: 02/24/22  3:41 AM   Specimen: BLOOD  Result Value Ref Range Status   Specimen Description BLOOD LEFT ANTECUBITAL  Final   Special Requests   Final    BOTTLES DRAWN AEROBIC AND ANAEROBIC Blood Culture adequate volume   Culture   Final    NO GROWTH 5 DAYS Performed at [facility], [place] [address] [place], [place] [address]    Report Status 03/01/2022 FINAL  Final    Labs: CBC: Recent Labs  Lab 02/28/22 0317 03/01/22 0258 03/02/22 0328 03/03/22 0353 03/04/22 0437  WBC 7.4 9.6 11.0* 10.1 8.5  HGB 13.2 12.9* 13.3 12.7* 11.7*  HCT 38.0* 37.8* 37.8* 35.4* 34.2*  MCV 93.6 93.6 91.5 91.7 93.7  PLT 201 192 186 187 [id number]   Basic Metabolic Panel: Recent Labs  Lab 03/01/22 0258 03/02/22 0328 03/03/22 0353 03/04/22 0437 03/05/22 0635  NA 139 137 137 139 137  K 3.7 3.7 3.5 3.7 3.9  CL 98 100 103 108 108  CO2 26 23 21* 23 22  GLUCOSE 129* 125* 132* 118* 111*  BUN 93* 92* 90* 84* 70*  CREATININE 7.91* 6.82* 5.27* 3.90* 2.76*  CALCIUM 7.9* 8.0* 8.2* 8.4* 8.3*   Liver Function Tests: Recent Labs  Lab 02/28/22 0317 03/01/22 0258 03/02/22 0328 03/03/22 0353 03/04/22 0437  AST 368* 240* 163* 117* 80*  ALT 313* 261*  214* 172* 137*  ALKPHOS 49 52 46 42 43  BILITOT 0.8 0.6 1.0 0.9 0.8  PROT 4.7* 4.8* 5.0* 5.0* 5.0*  ALBUMIN 2.1* 2.2* 2.2* 2.3* 2.3*   CBG: Recent Labs  Lab 02/28/22 0606 03/05/22 0813  GLUCAP 122* 118*    Discharge time spent: greater than 30 minutes.  Signed: Murray Hodgkins, MD [facility] 03/05/2022

## 2022-03-05 NOTE — TOC Transition Note (Signed)
Transition of Care [facility]) - CM/SW Discharge Note   Patient Details  Name: Franklin Anderson MRN: [id number] Date of Birth: 02/14/1971  [facility]) CM/SW Contact:  Kermit Balo, RN Phone Number: 03/05/2022, 2:30 PM   Clinical Narrative:    Pt is discharging to CIR today. CM signing off.    Final next level of care: IP Rehab Facility Barriers to Discharge: Inadequate or no insurance, Barriers Unresolved (comment)   Patient Goals and CMS Choice   CMS Medicare.gov Compare Post Acute Care list provided to:: Patient Choice offered to / list presented to : Patient  Discharge Placement                       Discharge Plan and Services                                     Social Determinants of Health (SDOH) Interventions Utilities Interventions: Inpatient TOC Financial Strain Interventions: Patient Refused (pt denies)   Readmission Risk Interventions     No data to display

## 2022-03-06 ENCOUNTER — Encounter (HOSPITAL_COMMUNITY): Payer: Self-pay | Admitting: Cardiology

## 2022-03-06 ENCOUNTER — Other Ambulatory Visit: Payer: Self-pay

## 2022-03-06 DIAGNOSIS — R7989 Other specified abnormal findings of blood chemistry: Secondary | ICD-10-CM

## 2022-03-06 DIAGNOSIS — M6282 Rhabdomyolysis: Secondary | ICD-10-CM

## 2022-03-06 DIAGNOSIS — D649 Anemia, unspecified: Secondary | ICD-10-CM

## 2022-03-06 DIAGNOSIS — I10 Essential (primary) hypertension: Secondary | ICD-10-CM

## 2022-03-06 DIAGNOSIS — N179 Acute kidney failure, unspecified: Secondary | ICD-10-CM

## 2022-03-06 LAB — CBC WITH DIFFERENTIAL/PLATELET
Abs Immature Granulocytes: 0.05 10*3/uL (ref 0.00–0.07)
Basophils Absolute: 0 10*3/uL (ref 0.0–0.1)
Basophils Relative: 0 %
Eosinophils Absolute: 0.2 10*3/uL (ref 0.0–0.5)
Eosinophils Relative: 3 %
HCT: 35.4 % — ABNORMAL LOW (ref 39.0–52.0)
Hemoglobin: 12.2 g/dL — ABNORMAL LOW (ref 13.0–17.0)
Immature Granulocytes: 1 %
Lymphocytes Relative: 19 %
Lymphs Abs: 1.8 10*3/uL (ref 0.7–4.0)
MCH: 33 pg (ref 26.0–34.0)
MCHC: 34.5 g/dL (ref 30.0–36.0)
MCV: 95.7 fL (ref 80.0–100.0)
Monocytes Absolute: 0.8 10*3/uL (ref 0.1–1.0)
Monocytes Relative: 8 %
Neutro Abs: 6.6 10*3/uL (ref 1.7–7.7)
Neutrophils Relative %: 69 %
Platelets: 309 10*3/uL (ref 150–400)
RBC: 3.7 MIL/uL — ABNORMAL LOW (ref 4.22–5.81)
RDW: 13 % (ref 11.5–15.5)
WBC: 9.4 10*3/uL (ref 4.0–10.5)
nRBC: 0 % (ref 0.0–0.2)

## 2022-03-06 LAB — COMPREHENSIVE METABOLIC PANEL
ALT: 100 U/L — ABNORMAL HIGH (ref 0–44)
AST: 50 U/L — ABNORMAL HIGH (ref 15–41)
Albumin: 2.6 g/dL — ABNORMAL LOW (ref 3.5–5.0)
Alkaline Phosphatase: 44 U/L (ref 38–126)
Anion gap: 12 (ref 5–15)
BUN: 59 mg/dL — ABNORMAL HIGH (ref 6–20)
CO2: 22 mmol/L (ref 22–32)
Calcium: 8.9 mg/dL (ref 8.9–10.3)
Chloride: 107 mmol/L (ref 98–111)
Creatinine, Ser: 2.3 mg/dL — ABNORMAL HIGH (ref 0.61–1.24)
GFR, Estimated: 34 mL/min — ABNORMAL LOW (ref >60)
Glucose, Bld: 114 mg/dL — ABNORMAL HIGH (ref 70–99)
Potassium: 4.4 mmol/L (ref 3.5–5.1)
Sodium: 141 mmol/L (ref 135–145)
Total Bilirubin: 0.6 mg/dL (ref 0.3–1.2)
Total Protein: 5.1 g/dL — ABNORMAL LOW (ref 6.5–8.1)

## 2022-03-06 MED ORDER — GABAPENTIN 100 MG PO CAPS
100.0000 mg | ORAL_CAPSULE | Freq: Three times a day (TID) | ORAL | Status: DC
  Administered 2022-03-06 – 2022-03-09 (×9): 100 mg via ORAL
  Filled 2022-03-06 (×10): qty 1

## 2022-03-06 MED ORDER — CLOPIDOGREL BISULFATE 75 MG PO TABS
75.0000 mg | ORAL_TABLET | Freq: Every day | ORAL | Status: DC
  Administered 2022-03-06 – 2022-03-13 (×8): 75 mg via ORAL
  Filled 2022-03-06 (×8): qty 1

## 2022-03-06 MED ORDER — ASPIRIN 81 MG PO TBEC
81.0000 mg | DELAYED_RELEASE_TABLET | Freq: Every day | ORAL | Status: DC
  Administered 2022-03-06 – 2022-03-13 (×8): 81 mg via ORAL
  Filled 2022-03-06 (×8): qty 1

## 2022-03-06 MED ORDER — DICLOFENAC SODIUM 1 % EX GEL
4.0000 g | Freq: Four times a day (QID) | CUTANEOUS | Status: DC
  Administered 2022-03-06 – 2022-03-12 (×5): 4 g via TOPICAL
  Filled 2022-03-06: qty 100

## 2022-03-06 NOTE — Plan of Care (Signed)
  Problem: Sit to Stand Goal: LTG:  Patient will perform sit to stand in prep for activites of daily living with assistance level (OT) Description: LTG:  Patient will perform sit to stand in prep for activites of daily living with assistance level (OT) Flowsheets (Taken 03/06/2022 1330) LTG: PT will perform sit to stand in prep for activites of daily living with assistance level: Independent with assistive device   Problem: RH Grooming Goal: LTG Patient will perform grooming w/assist,cues/equip (OT) Description: LTG: Patient will perform grooming with assist, with/without cues using equipment (OT) Flowsheets (Taken 03/06/2022 1330) LTG: Pt will perform grooming with assistance level of: Independent with assistive device    Problem: RH Bathing Goal: LTG Patient will bathe all body parts with assist levels (OT) Description: LTG: Patient will bathe all body parts with assist levels (OT) Flowsheets (Taken 03/06/2022 1330) LTG: Pt will perform bathing with assistance level/cueing: Independent with assistive device    Problem: RH Dressing Goal: LTG Patient will perform upper body dressing (OT) Description: LTG Patient will perform upper body dressing with assist, with/without cues (OT). Flowsheets (Taken 03/06/2022 1330) LTG: Pt will perform upper body dressing with assistance level of: Independent with assistive device Goal: LTG Patient will perform lower body dressing w/assist (OT) Description: LTG: Patient will perform lower body dressing with assist, with/without cues in positioning using equipment (OT) Flowsheets (Taken 03/06/2022 1330) LTG: Pt will perform lower body dressing with assistance level of: Independent with assistive device   Problem: RH Toileting Goal: LTG Patient will perform toileting task (3/3 steps) with assistance level (OT) Description: LTG: Patient will perform toileting task (3/3 steps) with assistance level (OT)  Flowsheets (Taken 03/06/2022 1330) LTG: Pt will  perform toileting task (3/3 steps) with assistance level: Independent with assistive device   Problem: RH Functional Use of Upper Extremity Goal: LTG Patient will use RT/LT upper extremity as a (OT) Description: LTG: Patient will use right/left upper extremity as a stabilizer/gross assist/diminished/nondominant/dominant level with assist, with/without cues during functional activity (OT) Flowsheets (Taken 03/06/2022 1330) LTG: Use of upper extremity in functional activities: RUE as dominant level LTG: Pt will use upper extremity in functional activity with assistance level of: Independent with assistive device   Problem: RH Toilet Transfers Goal: LTG Patient will perform toilet transfers w/assist (OT) Description: LTG: Patient will perform toilet transfers with assist, with/without cues using equipment (OT) Flowsheets (Taken 03/06/2022 1330) LTG: Pt will perform toilet transfers with assistance level of: Independent with assistive device   Problem: RH Tub/Shower Transfers Goal: LTG Patient will perform tub/shower transfers w/assist (OT) Description: LTG: Patient will perform tub/shower transfers with assist, with/without cues using equipment (OT) Flowsheets (Taken 03/06/2022 1330) LTG: Pt will perform tub/shower stall transfers with assistance level of: Independent with assistive device LTG: Pt will perform tub/shower transfers from: Walk in shower

## 2022-03-06 NOTE — Discharge Instructions (Signed)
Inpatient Rehab Discharge Instructions  LAURO MANLOVE Discharge date and time: No discharge date for patient encounter.   Activities/Precautions/ Functional Status: Activity: activity as tolerated Diet: regular diet Wound Care: Routine skin checks Functional status:  ___ No restrictions     ___ Walk up steps independently ___ 24/7 supervision/assistance   ___ Walk up steps with assistance ___ Intermittent supervision/assistance  ___ Bathe/dress independently ___ Walk with walker     _x__ Bathe/dress with assistance ___ Walk Independently    ___ Shower independently ___ Walk with assistance    ___ Shower with assistance ___ No alcohol     ___ Return to work/school ________  Special Instructions: No driving smoking or alcoholSTROKE/TIA DISCHARGE INSTRUCTIONS SMOKING Cigarette smoking nearly doubles your risk of having a stroke & is the single most alterable risk factor  If you smoke or have smoked in the last 12 months, you are advised to quit smoking for your health. Most of the excess cardiovascular risk related to smoking disappears within a year of stopping. Ask you doctor about anti-smoking medications [employer] Quit Line: 1-800-QUIT NOW Free Smoking Cessation Classes [phone]  CHOLESTEROL Know your levels; limit fat & cholesterol in your diet  Lipid Panel     Component Value Date/Time   CHOL 102 02/24/2022 0454   TRIG 49 02/24/2022 0454   HDL 34 (L) 02/24/2022 0454   CHOLHDL 3.0 02/24/2022 0454   VLDL 10 02/24/2022 0454   LDLCALC 58 02/24/2022 0454     Many patients benefit from treatment even if their cholesterol is at goal. Goal: Total Cholesterol (CHOL) less than 160 Goal:  Triglycerides (TRIG) less than 150 Goal:  HDL greater than 40 Goal:  LDL (LDLCALC) less than 100   BLOOD PRESSURE American Stroke Association blood pressure target is less that 120/80 mm/Hg  Your discharge blood pressure is:  BP: (!) 181/96 Monitor your blood pressure Limit your salt and alcohol  intake Many individuals will require more than one medication for high blood pressure  DIABETES (A1c is a blood sugar average for last 3 months) Goal HGBA1c is under 7% (HBGA1c is blood sugar average for last 3 months)  Diabetes: No known diagnosis of diabetes    Lab Results  Component Value Date   HGBA1C 5.7 (H) 02/24/2022    Your HGBA1c can be lowered with medications, healthy diet, and exercise. Check your blood sugar as directed by your physician Call your physician if you experience unexplained or low blood sugars.  PHYSICAL ACTIVITY/REHABILITATION Goal is 30 minutes at least 4 days per week  Activity: Increase activity slowly, Therapies: Physical Therapy: Home Health Return to work:  Activity decreases your risk of heart attack and stroke and makes your heart stronger.  It helps control your weight and blood pressure; helps you relax and can improve your mood. Participate in a regular exercise program. Talk with your doctor about the best form of exercise for you (dancing, walking, swimming, cycling).  DIET/WEIGHT Goal is to maintain a healthy weight  Your discharge diet is:  Diet Order             Diet regular Room service appropriate? Yes; Fluid consistency: Thin  Diet effective now                   liquids Your height is:    Your current weight is:   Your Body Mass Index (BMI) is:    Following the type of diet specifically designed for you will help prevent  another stroke. Your goal weight range is:   Your goal Body Mass Index (BMI) is 19-24. Healthy food habits can help reduce 3 risk factors for stroke:  High cholesterol, hypertension, and excess weight.  RESOURCES Stroke/Support Group:  Call [phone]   STROKE EDUCATION PROVIDED/REVIEWED AND GIVEN TO PATIENT Stroke warning signs and symptoms How to activate emergency medical system (call 911). Medications prescribed at discharge. Need for follow-up after discharge. Personal risk factors for  stroke. Pneumonia vaccine given: No Flu vaccine given: No My questions have been answered, the writing is legible, and I understand these instructions.  I will adhere to these goals & educational materials that have been provided to me after my discharge from the hospital.      My questions have been answered and I understand these instructions. I will adhere to these goals and the provided educational materials after my discharge from the hospital.  Patient/Caregiver Signature _______________________________ Date __________  Clinician Signature _______________________________________ Date __________  Please bring this form and your medication list with you to all your follow-up doctor's appointments.

## 2022-03-06 NOTE — Progress Notes (Addendum)
PROGRESS NOTE   Subjective/Complaints: Reports continued pain RLE greatest at hip. No additional concerns.   Review of Systems  Constitutional:  Negative for chills and fever.  HENT:  Negative for congestion.   Eyes:  Negative for blurred vision.  Respiratory:  Negative for shortness of breath.   Cardiovascular:  Negative for chest pain.  Gastrointestinal:  Negative for abdominal pain, constipation, diarrhea, nausea and vomiting.  Musculoskeletal:  Positive for joint pain.  Neurological:  Positive for weakness.    Objective:   ECHO TEE  Result Date: 03/04/2022    TRANSESOPHOGEAL ECHO REPORT   Patient Name:   Franklin Anderson Date of Exam: 03/04/2022 Medical Rec #:  [id number]      Height:       72.0 in Accession #:    [id number]     Weight:       162.9 lb Date of Birth:  1970-09-29     BSA:          1.953 m Patient Age:    50 years       BP:           149/105 mmHg Patient Gender: M              HR:           85 bpm. Exam Location:  Inpatient Procedure: Transesophageal Echo, Color Doppler and Cardiac Doppler Indications:     Stroke i63.9  History:         Patient has prior history of Echocardiogram examinations, most                  recent 02/25/2022. Risk Factors:Hypertension and Polysubstance                  Abuse.  Sonographer:     Irving Burton Senior RDCS Referring Phys:  Nada Boozer, R Diagnosing Phys: Thomasene Ripple DO PROCEDURE: After discussion of the risks and benefits of a TEE, an informed consent was obtained from the patient. The transesophogeal probe was passed without difficulty through the esophogus of the patient. Sedation performed by different physician. The patient was monitored while under deep sedation. Anesthestetic sedation was provided intravenously by Anesthesiology: 142mg  of Propofol, 60mg  of Lidocaine. The patient's vital signs; including heart rate, blood pressure, and oxygen saturation; remained stable throughout the  procedure. The patient developed no complications during the procedure.  IMPRESSIONS  1. Left ventricular ejection fraction, by estimation, is 55 to 60%. The left ventricle has normal function. The left ventricle has no regional wall motion abnormalities.  2. Right ventricular systolic function is normal. The right ventricular size is normal.  3. No left atrial/left atrial appendage thrombus was detected. The LAA emptying velocity was 96 cm/s.  4. The mitral valve is normal in structure. No evidence of mitral valve regurgitation. No evidence of mitral stenosis.  5. The aortic valve is normal in structure. Aortic valve regurgitation is trivial. No aortic stenosis is present.  6. There is Moderate (Grade III) layered and protruding plaque involving the descending aorta and aortic arch.  7. The inferior vena cava is normal in size with greater than 50% respiratory variability, suggesting right  atrial pressure of 3 mmHg.  8. Agitated saline contrast bubble study was negative, with no evidence of any interatrial shunt. Conclusion(s)/Recommendation(s): Normal biventricular function without evidence of hemodynamically significant valvular heart disease. FINDINGS  Left Ventricle: Left ventricular ejection fraction, by estimation, is 55 to 60%. The left ventricle has normal function. The left ventricle has no regional wall motion abnormalities. The left ventricular internal cavity size was normal in size. There is  no left ventricular hypertrophy. Right Ventricle: The right ventricular size is normal. No increase in right ventricular wall thickness. Right ventricular systolic function is normal. Left Atrium: Left atrial size was normal in size. No left atrial/left atrial appendage thrombus was detected. The LAA emptying velocity was 96 cm/s. Right Atrium: Right atrial size was normal in size. Pericardium: There is no evidence of pericardial effusion. Mitral Valve: The mitral valve is normal in structure. No evidence of  mitral valve regurgitation. No evidence of mitral valve stenosis. Tricuspid Valve: The tricuspid valve is normal in structure. Tricuspid valve regurgitation is trivial. No evidence of tricuspid stenosis. Aortic Valve: The aortic valve is normal in structure. Aortic valve regurgitation is trivial. No aortic stenosis is present. Pulmonic Valve: The pulmonic valve was normal in structure. Pulmonic valve regurgitation is not visualized. No evidence of pulmonic stenosis. Aorta: The aortic root is normal in size and structure. There is moderate (Grade III) layered and protruding plaque involving the descending aorta and aortic arch. Venous: The left upper pulmonary vein, left lower pulmonary vein, right upper pulmonary vein and right lower pulmonary vein are normal. A normal flow pattern is recorded from the left upper pulmonary vein. The inferior vena cava is normal in size with greater than 50% respiratory variability, suggesting right atrial pressure of 3 mmHg. IAS/Shunts: No atrial level shunt detected by color flow Doppler. Agitated saline contrast bubble study was negative, with no evidence of any interatrial shunt. Additional Comments: Spectral Doppler performed. Kardie Tobb DO Electronically signed by Thomasene Ripple DO Signature Date/Time: 03/04/2022/2:09:33 PM    Final    Recent Labs    03/05/22 1947 03/06/22 0603  WBC 10.2 9.4  HGB 12.1* 12.2*  HCT 34.5* 35.4*  PLT 294 309   Recent Labs    03/05/22 0635 03/05/22 1947 03/06/22 0603  NA 137  --  141  K 3.9  --  4.4  CL 108  --  107  CO2 22  --  22  GLUCOSE 111*  --  114*  BUN 70*  --  59*  CREATININE 2.76* 2.66* 2.30*  CALCIUM 8.3*  --  8.9    Intake/Output Summary (Last 24 hours) at 03/06/2022 1312 Last data filed at 03/06/2022 1145 Gross per 24 hour  Intake --  Output 2450 ml  Net -2450 ml     Pressure Injury 03/05/22 Tibial Posterior;Right Deep Tissue Pressure Injury - Purple or maroon localized area of discolored intact skin or  blood-filled blister due to damage of underlying soft tissue from pressure and/or shear. (Active)  03/05/22 2000  Location: Tibial  Location Orientation: Posterior;Right  Staging: Deep Tissue Pressure Injury - Purple or maroon localized area of discolored intact skin or blood-filled blister due to damage of underlying soft tissue from pressure and/or shear.  Wound Description (Comments):   Present on Admission: Yes    Physical Exam: Vital Signs Blood pressure (!) 159/103, pulse 88, temperature 98.3 F (36.8 C), temperature source Oral, resp. rate 16, height 6' (1.829 m), weight 66.1 kg, SpO2 97 %.   General:  Alert and oriented x 3, No apparent distress HEENT: Head is normocephalic, atraumatic, PERRLA, EOMI, sclera anicteric, oral mucosa pink and moist Neck: Supple without JVD or lymphadenopathy Heart: Reg rate and rhythm. + murmur Chest: CTA bilaterally without wheezes, rales, or rhonchi; no distress Abdomen: Soft, non-tender, non-distended, bowel sounds positive. Extremities: No clubbing, cyanosis. RLE edema 1+ Psych: a little anxious Skin: Clean and intact without signs of breakdown, R lateral calf covered in foam dressing Neuro:  Follows commands, makes eye contact, no dysarthria, Reduced sensation R foot, CN 2-12 grossly intact Musculoskeletal: No abdominal tone noted Tenderness throughout RLE greatest around hip and thigh     Strength:                RUE: 5/5 SA, 5/5 EF, 5/5 EE, 5/5 WE, 5/5 FF, 5/5 FA                 LUE: 5/5 SA, 5/5 EF, 5/5 EE, 5/5 WE, 5/5 FF, 5/5 FA                 RLE: 2+/5 HF, 2+/5 KE, 3+/5 DF, 4/5 EHL, 4/5 PF  - limited by pain                LLE:  5/5 HF, 5/5 KE, 5/5 DF, 5/5 EHL, 5/5 PF     Assessment/Plan: 1. Functional deficits which require 3+ hours per day of interdisciplinary therapy in a comprehensive inpatient rehab setting. Physiatrist is providing close team supervision and 24 hour management of active medical problems listed  below. Physiatrist and rehab team continue to assess barriers to discharge/monitor patient progress toward functional and medical goals  Care Tool:  Bathing              Bathing assist       Upper Body Dressing/Undressing Upper body dressing        Upper body assist      Lower Body Dressing/Undressing Lower body dressing            Lower body assist       Toileting Toileting    Toileting assist       Transfers Chair/bed transfer  Transfers assist           Locomotion Ambulation   Ambulation assist              Walk 10 feet activity   Assist           Walk 50 feet activity   Assist           Walk 150 feet activity   Assist           Walk 10 feet on uneven surface  activity   Assist           Wheelchair     Assist               Wheelchair 50 feet with 2 turns activity    Assist            Wheelchair 150 feet activity     Assist          Blood pressure (!) 159/103, pulse 88, temperature 98.3 F (36.8 C), temperature source Oral, resp. rate 16, height 6' (1.829 m), weight 66.1 kg, SpO2 97 %.  Medical Problem List and Plan: 1. 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 Siblings  -Continue CIR with PT/OT  2.  Antithrombotics: -DVT/anticoagulation:  Pharmaceutical: Heparin             -antiplatelet therapy:  Aspirin 81 mg daily and Plavix 75 mg day x21 days stopping 03/26/2022 then ASA alone for CVA prophylaxis  3. Pain Management: Oxycodone as needed             - Previously on dilaudid for breakthrough             - Endorses on Buprenorphine as OP although didn't see this recently on PDMP review   -11/10 Will add 100mg  gabapentin TID and Voltaren gel for pain 4. Mood/Behavior/Sleep:  Provide emotional support             -antipsychotic agents: N/A 5. Neuropsych/cognition: This patient is capable of making decisions on his own behalf. 6. Skin/Wound Care/right outer calf pressure ulcer: Routine skin checks.  WOC follow-up wound care 7. Fluids/Electrolytes/Nutrition: Routine in and outs with follow-up chemistries 8.  Severe rhabdo/AKI.  Follow-up nephrology services no current plan for hemodialysis.                        - Latest CK 2338/CRT 2.76                    - On 100 ml/hr IVF for maintenance - should be able to wean now that CK <5k; may get additional input from Nephrology                     - Strict Is and Os via foley - goal 200 cc/hr output                    - 11/10 Cr Down to 2.3, recheck CK with next labs  9.  Elevated LFTs.  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  HCV RNA elevated.  ID to schedule follow-up outpatient 1 month  -LFTs improved on CMP 03/06/22, continue to monitor 10.  Severe muscle inflammation of right hip and gluteal muscle/crush injury.  Follow-up orthopedic service Dr. 13/10/23.  No surgical intervention at this time.  Weightbearing as tolerated 11.  History of polysubstance + tobacco abuse.  Provide counseling.  Urine drug screen was positive for cocaine.                  - Pt endorses Hx prior SCI 2020; endorses no IVDU since then, no residual deficits                  - On buprenorphine as OP  12.  Hypertension.  Norvasc 10 mg daily, clonidine 0.2 mg twice daily, Lopressor 100 mg twice daily.  Monitor with increased mobility  -11/10 BP has been elevated intermittently, continue to monitor trend 13. Anemia   -Stable HGB at 12.2 03/06/22   LOS: 1 days A FACE TO FACE EVALUATION WAS PERFORMED  13/10/23 03/06/2022, 1:12 PM

## 2022-03-06 NOTE — Progress Notes (Signed)
0800 Lopressor given at 0630, BP 159/103 pulse 88. Patient in bed w/ call bell in reach.
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
## 2022-03-06 NOTE — Progress Notes (Signed)
Occupational Therapy Note  Patient Details  Name: Franklin Anderson MRN: [id number] Date of Birth: [birth year]  Today's Date: 03/06/2022 OT Missed Time: 60 Minutes Missed Time Reason: Pain;Other (comment) (wife arrived and pt requested time to speak with her alone)  Pt greeted supine in bed, pt reporting increased pain. Alerted LPN who reported pt could have pain meds however when OTA arrived back in room, pts wife present. Pt requested time to speak with wife alone.    Pollyann Glen [facility] 03/06/2022, 3:59 PM

## 2022-03-06 NOTE — Consult Note (Addendum)
WOC Nurse Consult Note: Reason for Consult:Right LE wound. Patient seen by my associate, D. Engels on 03/02/22 for this wound that was present on admission, a deep tissue pressure injury evolving into full thickness wound as is the anticipated trajectory. Please see note from that encounter. Wound type: Pressure Pressure Injury POA: Yes  POC established by Ms. Sylvie Farrier is for daily application of MediHoney after cleansing and for covering with a silicone foam dressing. The foam dressing may be reused for up to 3 days, change PRN rolling of dressing edges or drainage strike-through onto exterior of dressing. I am in agreement with that POC. I will add a Prevalon boot to the RLE while in bed.  WOC nursing team will not follow, but will remain available to this patient, the nursing and medical teams.  Please re-consult if needed.  Thank you for inviting [place] to participate in this patient's Plan of Care.  Ladona Mow, MSN, RN, CNS, GNP, Leda Min, [employer], [employer] phone:  [phone]

## 2022-03-06 NOTE — Plan of Care (Signed)
  Problem: RH Balance Goal: LTG Patient will maintain dynamic sitting balance (PT) Description: LTG:  Patient will maintain dynamic sitting balance with assistance during mobility activities (PT) Flowsheets (Taken 03/06/2022 1335) LTG: Pt will maintain dynamic sitting balance during mobility activities with:: Independent with assistive device  Goal: LTG Patient will maintain dynamic standing balance (PT) Description: LTG:  Patient will maintain dynamic standing balance with assistance during mobility activities (PT) Flowsheets (Taken 03/06/2022 1335) LTG: Pt will maintain dynamic standing balance during mobility activities with:: Independent with assistive device  Note: With LRAD   Problem: Sit to Stand Goal: LTG:  Patient will perform sit to stand with assistance level (PT) Description: LTG:  Patient will perform sit to stand with assistance level (PT) Flowsheets (Taken 03/06/2022 1335) LTG: PT will perform sit to stand in preparation for functional mobility with assistance level: Independent with assistive device Note: With LRAD   Problem: RH Bed Mobility Goal: LTG Patient will perform bed mobility with assist (PT) Description: LTG: Patient will perform bed mobility with assistance, with/without cues (PT). Flowsheets (Taken 03/06/2022 1335) LTG: Pt will perform bed mobility with assistance level of: Independent with assistive device    Problem: RH Bed to Chair Transfers Goal: LTG Patient will perform bed/chair transfers w/assist (PT) Description: LTG: Patient will perform bed to chair transfers with assistance (PT). Flowsheets (Taken 03/06/2022 1335) LTG: Pt will perform Bed to Chair Transfers with assistance level: Independent with assistive device  Note: With LRAD   Problem: RH Car Transfers Goal: LTG Patient will perform car transfers with assist (PT) Description: LTG: Patient will perform car transfers with assistance (PT). Flowsheets (Taken 03/06/2022 1335) LTG: Pt will  perform car transfers with assist:: Independent with assistive device  Note: With LRAD   Problem: RH Ambulation Goal: LTG Patient will ambulate in controlled environment (PT) Description: LTG: Patient will ambulate in a controlled environment, # of feet with assistance (PT). Flowsheets (Taken 03/06/2022 1335) LTG: Pt will ambulate in controlled environ  assist needed:: Independent with assistive device LTG: Ambulation distance in controlled environment: 373ft With LRAD Goal: LTG Patient will ambulate in home environment (PT) Description: LTG: Patient will ambulate in home environment, # of feet with assistance (PT). Flowsheets (Taken 03/06/2022 1335) LTG: Ambulation distance in home environment: 169ft With LRAD   Problem: RH Stairs Goal: LTG Patient will ambulate up and down stairs w/assist (PT) Description: LTG: Patient will ambulate up and down # of stairs with assistance (PT) Flowsheets (Taken 03/06/2022 1335) LTG: Pt will ambulate up/down stairs assist needed:: Independent with assistive device LTG: Pt will  ambulate up and down number of stairs: 4 steps with B HR support

## 2022-03-06 NOTE — Progress Notes (Signed)
After discussion with neurology services plan was to begin low-dose aspirin 81 mg daily and Plavix 75 mg day x21 days stopping 03/26/2022 and then aspirin alone for CVA prophylaxis.
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
## 2022-03-06 NOTE — Progress Notes (Signed)
Inpatient Rehabilitation Center Individual Statement of Services  Patient Name:  Franklin Anderson  Date:  03/06/2022  Welcome to the Inpatient Rehabilitation Center.  Our goal is to provide you with an individualized program based on your diagnosis and situation, designed to meet your specific needs.  With this comprehensive rehabilitation program, you will be expected to participate in at least 3 hours of rehabilitation therapies Monday-Friday, with modified therapy programming on the weekends.  Your rehabilitation program will include the following services:  Physical Therapy (PT), Occupational Therapy (OT), Speech Therapy (ST), 24 hour per day rehabilitation nursing, Therapeutic Recreaction (TR), Neuropsychology, Care Coordinator, Rehabilitation Medicine, Nutrition Services, Pharmacy Services, and Other  Weekly team conferences will be held on Wednesdays to discuss your progress.  Your Inpatient Rehabilitation Care Coordinator will talk with you frequently to get your input and to update you on team discussions.  Team conferences with you and your family in attendance may also be held.  Expected length of stay:  10-14 Days  Overall anticipated outcome:  Supervision  Depending on your progress and recovery, your program may change. Your Inpatient Rehabilitation Care Coordinator will coordinate services and will keep you informed of any changes. Your Inpatient Rehabilitation Care Coordinator's name and contact numbers are listed  below.  The following services may also be recommended but are not provided by the Inpatient Rehabilitation Center:   Home Health Rehabiltiation Services Outpatient Rehabilitation Services  Arrangements will be made to provide these services after discharge if needed.  Arrangements include referral to agencies that provide these services.  Your insurance has been verified to be:   uninsured Your primary doctor is:  Cain Saupe, MD  Pertinent information will be  shared with your doctor and your insurance company.  Inpatient Rehabilitation Care Coordinator:  Lavera Guise, [place] [phone] or [phone]  Information discussed with and copy given to patient by: Andria Rhein, 03/06/2022, 10:52 AM

## 2022-03-06 NOTE — Progress Notes (Signed)
Inpatient Rehabilitation  Patient information reviewed and entered into eRehab system by Michaila Kenney Claude Waldman, OTR/L, Rehab Quality Coordinator.   Information including medical coding, functional ability and quality indicators will be reviewed and updated through discharge.   

## 2022-03-06 NOTE — Progress Notes (Addendum)
Inpatient Rehabilitation Admission Medication Review by a Pharmacist  A complete drug regimen review was completed for this patient to identify any potential clinically significant medication issues.  High Risk Drug Classes Is patient taking? Indication by Medication  Antipsychotic No   Anticoagulant Yes Sq heparin - VTE ppx  Antibiotic No   Opioid Yes Oxycodone - prn pain  Antiplatelet No   Hypoglycemics/insulin No   Vasoactive Medication Yes Clonidine, metoprolol, amlodipine- BP  Chemotherapy No   Other Yes Zofran - prn N/V  Medihoney - wound treatment      Type of Medication Issue Identified Description of Issue Recommendation(s)  Drug Interaction(s) (clinically significant)     Duplicate Therapy     Allergy     No Medication Administration End Date     Incorrect Dose     Additional Drug Therapy Needed     Significant med changes from prior encounter (inform family/care partners about these prior to discharge).    Other       Clinically significant medication issues were identified that warrant physician communication and completion of prescribed/recommended actions by midnight of the next day:  No   Pharmacist comments: Need to clarify aspirin, clopidogrel - clarified and added  Time spent performing this drug regimen review (minutes):  20 minutes  Thank you Okey Regal, PharmD
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
## 2022-03-07 NOTE — Progress Notes (Addendum)
Physical Therapy Session Note  Patient Details  Name: Franklin Anderson MRN: [id number] Date of Birth: December 23, 1970  Today's Date: 03/07/2022 PT Individual Time:  0800-0855   Pt time calculation (min): 55 min   Short Term Goals: Week 1:  PT Short Term Goal 1 (Week 1): = to LTG due to ELOS  Session 1  Skilled Therapeutic Interventions/Progress Updates:   PT supine in bed on entrance. Pt alert and agreeable for treatment session. Pt reports his hip pain to be 10/10 but was able to perform mobility activities throughout treatment session. Pt is oriented x4 with mild agitation earlier in session due to right hip pain.  Therapeutic Activity:  Bed mobility: Pt performed supine to sit EOB with CGA to Min A for right leg guidance.   Transfers:   Pt demonstrated sit<> stand, stand pivot, and ambulatory transfers throughout treatment session using RW with close supervision.   Gait: Patient able to ambulate to day room using RW with CGA x 153f. Pt demonstrated step to gait pattern and was provided intermittent cuing for maintaining upright posture and reducing foot drop.   LE Therapeutic Exercise: Pt completed modified glute bridges (3 x 10 sec) and AB hip isometric (3 x 8 w/ 3 sec hold)  w/ manual resistance at knees. Exercises were performed supine on wedge w/ two pillows behind head, knees bent w/ blue cylinder wedge behind knees. Pt provided with intermittent verbal cuing for sustaining muscle contraction. Performed PROM on right leg to reduce right hip pain and improve Pt agitation.  Balance: Pt attempted Berg balance assessment and scored a 21 but was unable to complete full assessment due to right hip pain. Pt completed 5 STS with a time of 49 sec  from high mat table using RW w/ close supervision.    Pt showed required frequent rest breaks throughout treatment session due to right pain. Pt showed increase agitation with functional activities throughout treatment session.  Pt responds well  to positive reinforcement and encouragement.  Pt ambulated back to room using RW w/ close supervision x 1455f Performed ambulatory transfer and sit to supine with min assist for right leg. Pt supine in in bed with call bell bed alarm on call bell present and all needs met.  Therapy Documentation Precautions:  Precautions Precautions: Fall Precaution Comments: scrotal edema, severe right hip pain with all mobility. Restrictions Weight Bearing Restrictions: No   Therapy Vitals Pulse Rate: 94 Resp: 18 BP: (!) 154/93 Patient Position (if appropriate): Lying Pain: Pain Assessment Pain Scale: 0-10 Pain Score: 10-Worst pain ever Faces Pain Scale: Hurts even more Pain Type: Acute pain Pain Location: Hip Pain Orientation: Right Pain Radiating Towards:  (Right leg/right foot) Pain Descriptors / Indicators: Constant Pain Frequency: Constant Pain Onset: On-going Patients Stated Pain Goal: 0 Pain Intervention(s): Medication (See eMAR) Multiple Pain Sites: No  Therapy/Group: Individual Therapy  EvHilary HertzT, SPT   EvHilary Hertz1/02/2022, 9:45 AM

## 2022-03-07 NOTE — IPOC Note (Signed)
Overall Plan of Care [facility]) Patient Details Name: Franklin Anderson MRN: [id number] DOB: 30-Aug-1970  Admitting Diagnosis: CVA (cerebral vascular accident) [facility])  Hospital Problems: Principal Problem:   CVA (cerebral vascular accident) [facility])     Functional Problem List: Nursing Bowel, Bladder, Endurance, Medication Management, Pain, Safety, Skin Integrity  PT Balance, Endurance, Motor, Pain, Skin Integrity, Sensory  OT Balance, Motor, Endurance, Sensory, Edema, Pain, Skin Integrity, Safety  SLP    TR         Basic ADL's: OT Grooming, Bathing, Dressing, Toileting     Advanced  ADL's: OT       Transfers: PT Bed Mobility, Bed to Chair, Banker, Toilet     Locomotion: PT Ambulation, Stairs     Additional Impairments: OT    SLP        TR      Anticipated Outcomes Item Anticipated Outcome  Self Feeding Independent  Swallowing      Basic self-care  Mod I  Toileting  Mod I   Bathroom Transfers Mod I  Bowel/Bladder  manage bowel w mod I assist and bladder w toileting  Transfers  mod i  Locomotion  mod i  Communication     Cognition     Pain  < 4 with prns  Safety/Judgment  manage w cues   Therapy Plan: PT Intensity: Minimum of 1-2 x/day ,45 to 90 minutes PT Frequency: 5 out of 7 days PT Duration Estimated Length of Stay: 7-9 days OT Intensity: Minimum of 1-2 x/day, 45 to 90 minutes OT Frequency: 5 out of 7 days OT Duration/Estimated Length of Stay: 7-9 days     Team Interventions: Nursing Interventions Patient/Family Education, Bladder Management, Skin Care/Wound Management, Bowel Management, Discharge Planning, Disease Management/Prevention, Pain Management, Medication Management  PT interventions Ambulation/gait training, Discharge planning, DME/adaptive equipment instruction, Functional mobility training, Pain management, Psychosocial support, Splinting/orthotics, Therapeutic Activities, UE/LE Strength taining/ROM, Designer, jewellery, Community reintegration, Disease management/prevention, Development worker, international aid stimulation, Neuromuscular re-education, Patient/family education, Skin care/wound management, Stair training, UE/LE Coordination activities, Therapeutic Exercise, Wheelchair propulsion/positioning  OT Interventions Warden/ranger, Community reintegration, Disease mangement/prevention, Neuromuscular re-education, Equities trader education, Self Care/advanced ADL retraining, Therapeutic Exercise, UE/LE Coordination activities, Discharge planning, DME/adaptive equipment instruction, Functional mobility training, Pain management, Psychosocial support, Skin care/wound managment, Therapeutic Activities, UE/LE Strength taining/ROM  SLP Interventions    TR Interventions    SW/CM Interventions Discharge Planning, Psychosocial Support, Patient/Family Education, Disease Management/Prevention   Barriers to Discharge MD  Medical stability and Home enviroment access/loayout  Nursing Decreased caregiver support, Home environment access/layout 1 level 3 ste mobile home w wife  PT Decreased caregiver support, Lack of/limited family support, Community education officer for SNF coverage    OT      SLP      SW Insurance for SNF coverage, Lack of/limited family support, Inaccessible home environment, Decreased caregiver support, IV antibiotics     Team Discharge Planning: Destination: PT-Home ,OT- Home , SLP-  Projected Follow-up: PT-Outpatient PT, OT-  Outpatient OT, SLP-  Projected Equipment Needs: PT-To be determined, OT- To be determined, SLP-  Equipment Details: PT- , OT-  Patient/family involved in discharge planning: PT- Patient,  OT-Patient, SLP-   MD ELOS: 10-14 Medical Rehab Prognosis:  Excellent Assessment: The patient has been admitted for CIR therapies with the diagnosis of 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 The team will be addressing functional mobility, strength, stamina,  balance, safety, adaptive techniques and equipment, self-care, bowel and  bladder mgt, patient and caregiver education. Goals have been set at supervision. Anticipated discharge destination is home.        See Team Conference Notes for weekly updates to the plan of care

## 2022-03-07 NOTE — Progress Notes (Signed)
Occupational Therapy Session Note  Patient Details  Name: Franklin Anderson MRN: [id number] Date of Birth: 15-Dec-1970  Today's Date: 03/07/2022 OT Individual Time: 1015-1030 and 1500-1545 OT Individual Time Calculation (min): 15 min  (missed 30 min due to pain)  and 45 min        Short Term Goals: Week 1:  OT Short Term Goal 1 (Week 1): Pt will tolerate standing 3 minutes during functional activities OT Short Term Goal 2 (Week 1): Pt will demonstrate learning of 2 pain management techniques with min A OT Short Term Goal 3 (Week 1): Pt will complete U/LB bathing with CGA OT Short Term Goal 4 (Week 1): Pt will complete LB dressing with min A  Skilled Therapeutic Interventions/Progress Updates:    Visit 1: Pain: 10/10 - pt stated his R leg pain was a nerve feeling pain and it was intolerable.  Pt stated that he was given the medication he was allowed to have.   Due to pt's intense hip pain, provided him with a hot pack to see if it would provide any relief (it did not after checking on him later).  Spent time talking with pt as he was very concerned it would be perceived that he was refusing to participate. Pt explained in lengthy detail that he has excellent pain tolerance due to his intense work in the past and previous injuries.  Reassured pt that therapy will do our best to work with him to get him to a more functional level and if his pain is extreme, he can rest.  Pt appreciated this.  Pt continuing to rest.    Visit 2: Pain: 7/10 - but pt agreeable to participating.   Pt already in wc, self propelled to day room. Used RW with close S to move to [employer].  Resistance 1 at a slow pace. Pt unable to tolerate pushing through foot on new step, but did tolerate the PROM of his hip and knee when it was mobilized with arms and L leg moving other parts of Nustep.  Pt worked on slow ROM and stated his hip started to feel better as he progressed.  Pt talked a lot about his past work and a previous  nerve injury in 2000.  Pt then ambulated with RW with S back to his room.  To make getting in bed easier, had pt use extended gait belt as a leg loop to lift leg onto bed.  He said this was helpful.  Pt resting in bed with all needs met and alarm set.   Therapy Documentation Precautions:  Precautions Precautions: Fall Precaution Comments: scrotal edema, severe right hip pain with all mobility. Restrictions Weight Bearing Restrictions: No    ADL: ADL Eating: Set up Where Assessed-Eating: Edge of bed Grooming: Contact guard Where Assessed-Grooming: Wheelchair Upper Body Bathing: Contact guard Where Assessed-Upper Body Bathing: Shower Lower Body Bathing: Minimal assistance Where Assessed-Lower Body Bathing: Shower Upper Body Dressing: Contact guard Where Assessed-Upper Body Dressing: Edge of bed Lower Body Dressing: Moderate assistance Where Assessed-Lower Body Dressing: Edge of bed Toileting: Minimal assistance Where Assessed-Toileting: Glass blower/designer: Therapist, music Method: Counselling psychologist: Energy manager: Curator Method: Heritage manager: Radio broadcast assistant   Therapy/Group: Individual Therapy  Royalty Fakhouri 03/07/2022, 7:53 AM

## 2022-03-07 NOTE — Progress Notes (Signed)
Physical Therapy Session Note  Patient Details  Name: Franklin Anderson MRN: [id number] Date of Birth: April 02, 1971  Today's Date: 03/07/2022 PT Individual Time: 1300-1402 PT Individual Time Calculation (min): 62 min   Short Term Goals: Week 1:  PT Short Term Goal 1 (Week 1): = to LTG due to ELOS   Skilled Therapeutic Interventions/Progress Updates:   Pt received supine in bed and agreeable to PT. Supine>sit transfer with min assist at the RLE for pain management.  Ambulatory transfer to bathroom with RW and CGA. Mild increase in pain in the RLE. Hand hygiene at sink performed with supervision assist and intermittent UE support.   Pt transported to rehab gym in [facility].   Dynamic gait training in parallel bars:  Forward reverse 99f x 2  Side stepping R and L 834fx 1 side stepping Stepping over balance block x 2 Bil lateral  step up x 6 Bil with min assist from PT for safety.  Side stepping up/down 4inch step, step over balance block performed 15f35f 2 bil.     Cues for improved ankle DF to allow heel contact and well as increases step length and improved posture as tolerated. CGA from PT for safety with facilitation for pelvic rotation to decrease compensation through trunk. Noted heavy use of BUE support when standing on RLE due to Pain throughout dynamic gait training.  Ball toss off rebounder 2kg ball 10x 2 and 4kg ball 10x 1 with CGA progressing to supervision assist from PT for balance and safety. RW available for safety standing on level surface throughout.   Balance training performed in parallel bars.  Complete moderate difficulty peg board puzzle with no UE support on level surface x 1 and then on airex pad x 2. Supervision assist from PT for safety with min cues for decreased UE support as tolerated.     Patient returned to room and left sitting in WC [facility] call bell in reach and all needs met.      Therapy Documentation Precautions:  Precautions Precautions: Fall Precaution  Comments: scrotal edema, severe right hip pain with all mobility. Restrictions Weight Bearing Restrictions: No  Vital Signs: Therapy Vitals Temp: 98 F (36.7 C) Pulse Rate: 87 Resp: 17 BP: (!) 143/83 Patient Position (if appropriate): Sitting Oxygen Therapy SpO2: 99 % O2 Device: Room Air Pain: Pain Assessment Pain Score: 7  RLE Ambulation increased.   Therapy/Group: Individual Therapy  AusLorie Phenix/02/2022, 2:10 PM

## 2022-03-08 DIAGNOSIS — S7401XD Injury of sciatic nerve at hip and thigh level, right leg, subsequent encounter: Secondary | ICD-10-CM

## 2022-03-08 MED ORDER — LIDOCAINE 5 % EX PTCH
1.0000 | MEDICATED_PATCH | CUTANEOUS | Status: DC
  Filled 2022-03-08 (×5): qty 1

## 2022-03-08 MED ORDER — ORAL CARE MOUTH RINSE: 15.0000 mL | OROMUCOSAL | Status: DC | PRN

## 2022-03-08 NOTE — Progress Notes (Addendum)
Entered room patient observed lying in bed eyes closed and resting. Having to wake patient to give him scheduled medication. During this interaction patient did not complain of any pain or discomfort. Exiting room patient did ask about PRN pain medication. PRN pain medication was given as patient rated pain "10". Endorses not wanting to take the future scheduled medication of transdermal lidocaine patch as well as refusing scheduled VOLTAREN topical medication this evening as patient quoted medication does not provide relief for pain. After administering medication did reposition patient to provide non-pharmaceutical relief for current pain which patient rates pain level as a "10".  Tula Nakayama, LPN  [id number]

## 2022-03-08 NOTE — Progress Notes (Addendum)
PROGRESS NOTE   Subjective/Complaints:  Pt comfortable , no pain today , but had pain with PT yesterday at the right hip   Review of Systems  Constitutional:  Negative for chills and fever.  HENT:  Negative for congestion.   Eyes:  Negative for blurred vision.  Respiratory:  Negative for shortness of breath.   Cardiovascular:  Negative for chest pain.  Gastrointestinal:  Negative for abdominal pain, constipation, diarrhea, nausea and vomiting.  Musculoskeletal:  Positive for joint pain.  Neurological:  Positive for weakness.    Objective:   No results found. Recent Labs    03/05/22 1947 03/06/22 0603  WBC 10.2 9.4  HGB 12.1* 12.2*  HCT 34.5* 35.4*  PLT 294 309    Recent Labs    03/05/22 1947 03/06/22 0603  NA  --  141  K  --  4.4  CL  --  107  CO2  --  22  GLUCOSE  --  114*  BUN  --  59*  CREATININE 2.66* 2.30*  CALCIUM  --  8.9     Intake/Output Summary (Last 24 hours) at 03/08/2022 1124 Last data filed at 03/08/2022 0730 Gross per 24 hour  Intake 474 ml  Output 2700 ml  Net -2226 ml      Pressure Injury 03/05/22 Tibial Posterior;Right Deep Tissue Pressure Injury - Purple or maroon localized area of discolored intact skin or blood-filled blister due to damage of underlying soft tissue from pressure and/or shear. (Active)  03/05/22 2000  Location: Tibial  Location Orientation: Posterior;Right  Staging: Deep Tissue Pressure Injury - Purple or maroon localized area of discolored intact skin or blood-filled blister due to damage of underlying soft tissue from pressure and/or shear.  Wound Description (Comments):   Present on Admission: Yes    Physical Exam: Vital Signs Blood pressure (!) 156/90, pulse 92, temperature 98.5 F (36.9 C), temperature source Oral, resp. rate 18, height 6' (1.829 m), weight 65.2 kg, SpO2 97 %.   General: No acute distress Mood and affect are appropriate Heart:  Regular rate and rhythm no rubs murmurs or extra sounds Lungs: Clear to auscultation, breathing unlabored, no rales or wheezes Abdomen: Positive bowel sounds, soft nontender to palpation, nondistended Extremities: No clubbing, cyanosis, or edema Skin: No evidence of breakdown, no evidence of rash  Musculoskeletal: No abdominal tone noted Tenderness throughout RLE greatest around hip and thigh     Strength:                RUE: 5/5 SA, 5/5 EF, 5/5 EE, 5/5 WE, 5/5 FF, 5/5 FA                 LUE: 5/5 SA, 5/5 EF, 5/5 EE, 5/5 WE, 5/5 FF, 5/5 FA                 RLE: 2+/5 HF, 2+/5 KE, 3+/5 DF, 4/5 EHL, 4/5 PF  - limited by pain proximally but does have foot drop as well on the right side  Reduced sensation RIght dorsal foot                 LLE:  5/5 HF, 5/5 KE,  5/5 DF, 5/5 EHL, 5/5 PF     Assessment/Plan: 1. Functional deficits which require 3+ hours per day of interdisciplinary therapy in a comprehensive inpatient rehab setting. Physiatrist is providing close team supervision and 24 hour management of active medical problems listed below. Physiatrist and rehab team continue to assess barriers to discharge/monitor patient progress toward functional and medical goals  Care Tool:  Bathing    Body parts bathed by patient: Chest, Right arm, Left arm, Abdomen, Front perineal area, Left upper leg, Right upper leg, Face   Body parts bathed by helper: Right lower leg, Buttocks, Left lower leg     Bathing assist Assist Level: Minimal Assistance - Patient > 75%     Upper Body Dressing/Undressing Upper body dressing   What is the patient wearing?: Pull over shirt    Upper body assist Assist Level: Contact Guard/Touching assist    Lower Body Dressing/Undressing Lower body dressing            Lower body assist Assist for lower body dressing: Moderate Assistance - Patient 50 - 74%     Toileting Toileting    Toileting assist Assist for toileting: Contact Guard/Touching assist      Transfers Chair/bed transfer  Transfers assist     Chair/bed transfer assist level: Contact Guard/Touching assist Chair/bed transfer assistive device: Programmer, multimedia   Ambulation assist      Assist level: Contact Guard/Touching assist Assistive device: Walker-rolling Max distance: 57ft   Walk 10 feet activity   Assist     Assist level: Contact Guard/Touching assist Assistive device: Walker-rolling   Walk 50 feet activity   Assist    Assist level: Contact Guard/Touching assist Assistive device: Walker-rolling    Walk 150 feet activity   Assist Walk 150 feet activity did not occur: Safety/medical concerns         Walk 10 feet on uneven surface  activity   Assist     Assist level: Contact Guard/Touching assist Assistive device: Walker-rolling   Wheelchair     Assist Is the patient using a wheelchair?: Yes Type of Wheelchair: Manual    Wheelchair assist level: Supervision/Verbal cueing Max wheelchair distance: 4ft    Wheelchair 50 feet with 2 turns activity    Assist        Assist Level: Total Assistance - Patient < 25%   Wheelchair 150 feet activity     Assist      Assist Level: Total Assistance - Patient < 25%   Blood pressure (!) 156/90, pulse 92, temperature 98.5 F (36.9 C), temperature source Oral, resp. rate 18, height 6' (1.829 m), weight 65.2 kg, SpO2 97 %.  Medical Problem List and Plan: 1. 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 Siblings  -Continue CIR with PT/OT  2.  Antithrombotics: -DVT/anticoagulation:  Pharmaceutical: Heparin             -antiplatelet therapy:  Aspirin 81 mg daily and Plavix 75 mg day x21 days  stopping 03/26/2022 then ASA alone for CVA prophylaxis  3. Pain Management: Oxycodone as  needed taking 60mg  per day              - Previously on dilaudid for breakthrough             - Endorses on Buprenorphine as OP although didn't see this recently on PDMP review- pt will need to give name of prescriber as he gets closer to discharge    -11/10 Will add 100mg  gabapentin TID and dc  Voltaren gel  11/12 trial Lidoderm patch , start in am   4. Mood/Behavior/Sleep: Provide emotional support             -antipsychotic agents: N/A 5. Neuropsych/cognition: This patient is capable of making decisions on his own behalf. 6. Skin/Wound Care/right outer calf pressure ulcer: Routine skin checks.  WOC follow-up wound care 7. Fluids/Electrolytes/Nutrition: Routine in and outs with follow-up chemistries 8.  Severe rhabdo/AKI.  Follow-up nephrology services no current plan for hemodialysis.                        - Latest CK 2338/CRT 2.76                    - On 100 ml/hr IVF for maintenance - should be able to wean now that CK <5k; may get additional input from Nephrology                     - Strict Is and Os via foley - goal 200 cc/hr output                    - 11/10 Cr Down to 2.3, recheck CK with next labs  9.  Elevated LFTs.  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  HCV RNA elevated.  ID to schedule follow-up outpatient 1 month  -LFTs improved on CMP 03/06/22, continue to monitor 10.  Severe muscle inflammation of right hip and gluteal muscle/crush injury.  Follow-up orthopedic service Dr. 13/10.  No surgical intervention at this time.  Weightbearing as tolerated 11.  History of polysubstance + tobacco abuse.  Provide counseling.  Urine drug screen was positive for cocaine.                  - Pt endorses Hx prior SCI 2020; endorses no IVDU since then, no residual deficits                  - On buprenorphine as OP - pt will need to f/u at that clinic post discharge  12.   Hypertension.  Norvasc 10 mg daily, clonidine 0.2 mg twice daily, Lopressor 100 mg twice daily.  Monitor with increased mobility   Vitals:   03/07/22 1400 03/07/22 1922  BP: (!) 143/83 (!) 156/90  Pulse: 87 92  Resp: 17 18  Temp: 98 F (36.7 C) 98.5 F (36.9 C)  SpO2: 99% 97%  Cont to monitor may need to increase clonidine dose if BP remains elevated   13. Anemia   -Stable HGB at 12.2 03/06/22   LOS: 3 days A FACE TO FACE EVALUATION WAS PERFORMED  13/11/23 03/08/2022, 11:24 AM

## 2022-03-08 NOTE — IPOC Note (Signed)
Overall Plan of Care [facility]) Patient Details Name: Franklin Anderson MRN: [id number] DOB: [birth year]  Admitting Diagnosis: Right sciatic nerve injury  Hospital Problems: Right sciatic nerve injury      Functional Problem List: Nursing Bowel, Bladder, Endurance, Medication Management, Pain, Safety, Skin Integrity  PT Balance, Endurance, Motor, Pain, Skin Integrity, Sensory  OT Balance, Motor, Endurance, Sensory, Edema, Pain, Skin Integrity, Safety  SLP    TR         Basic ADL's: OT Grooming, Bathing, Dressing, Toileting     Advanced  ADL's: OT       Transfers: PT Bed Mobility, Bed to Chair, Banker, Toilet     Locomotion: PT Ambulation, Stairs     Additional Impairments: OT    SLP        TR      Anticipated Outcomes Item Anticipated Outcome  Self Feeding Independent  Swallowing      Basic self-care  Mod I  Toileting  Mod I   Bathroom Transfers Mod I  Bowel/Bladder  manage bowel w mod I assist and bladder w toileting  Transfers  mod i  Locomotion  mod i  Communication     Cognition     Pain  < 4 with prns  Safety/Judgment  manage w cues   Therapy Plan: PT Intensity: Minimum of 1-2 x/day ,45 to 90 minutes PT Frequency: 5 out of 7 days PT Duration Estimated Length of Stay: 7-9 days OT Intensity: Minimum of 1-2 x/day, 45 to 90 minutes OT Frequency: 5 out of 7 days OT Duration/Estimated Length of Stay: 7-9 days     Team Interventions: Nursing Interventions Patient/Family Education, Bladder Management, Skin Care/Wound Management, Bowel Management, Discharge Planning, Disease Management/Prevention, Pain Management, Medication Management  PT interventions Ambulation/gait training, Discharge planning, DME/adaptive equipment instruction, Functional mobility training, Pain management, Psychosocial support, Splinting/orthotics, Therapeutic Activities, UE/LE Strength taining/ROM, Warden/ranger, Community reintegration, Disease  management/prevention, Development worker, international aid stimulation, Neuromuscular re-education, Patient/family education, Skin care/wound management, Stair training, UE/LE Coordination activities, Therapeutic Exercise, Wheelchair propulsion/positioning  OT Interventions Warden/ranger, Community reintegration, Disease mangement/prevention, Neuromuscular re-education, Equities trader education, Self Care/advanced ADL retraining, Therapeutic Exercise, UE/LE Coordination activities, Discharge planning, DME/adaptive equipment instruction, Functional mobility training, Pain management, Psychosocial support, Skin care/wound managment, Therapeutic Activities, UE/LE Strength taining/ROM  SLP Interventions    TR Interventions    SW/CM Interventions Discharge Planning, Psychosocial Support, Patient/Family Education, Disease Management/Prevention   Barriers to Discharge MD  Medical stability and substance abuse  Nursing Decreased caregiver support, Home environment access/layout 1 level 3 ste mobile home w wife  PT Decreased caregiver support, Lack of/limited family support, Community education officer for SNF coverage    OT      SLP      SW Insurance for SNF coverage, Lack of/limited family support, Inaccessible home environment, Decreased caregiver support, IV antibiotics     Team Discharge Planning: Destination: PT-Home ,OT- Home , SLP-  Projected Follow-up: PT-Outpatient PT, OT-  Outpatient OT, SLP-  Projected Equipment Needs: PT-To be determined, OT- To be determined, SLP-  Equipment Details: PT- , OT-  Patient/family involved in discharge planning: PT- Patient,  OT-Patient, SLP-   MD ELOS: 10-14d Medical Rehab Prognosis:  Good Assessment: The patient has been admitted for CIR therapies with the diagnosis of RIght sciatic nerve injury . The team will be addressing functional mobility, strength, stamina, balance, safety, adaptive techniques and equipment, self-care, bowel and bladder mgt, patient and caregiver  education, acute renal failure. Goals have been set at  Mod I. Anticipated discharge destination is Home .  See Team Conference Notes for weekly updates to the plan of care

## 2022-03-09 DIAGNOSIS — M79604 Pain in right leg: Secondary | ICD-10-CM

## 2022-03-09 LAB — COMPREHENSIVE METABOLIC PANEL
ALT: 63 U/L — ABNORMAL HIGH (ref 0–44)
AST: 28 U/L (ref 15–41)
Albumin: 2.7 g/dL — ABNORMAL LOW (ref 3.5–5.0)
Alkaline Phosphatase: 48 U/L (ref 38–126)
Anion gap: 9 (ref 5–15)
BUN: 37 mg/dL — ABNORMAL HIGH (ref 6–20)
CO2: 27 mmol/L (ref 22–32)
Calcium: 9.2 mg/dL (ref 8.9–10.3)
Chloride: 108 mmol/L (ref 98–111)
Creatinine, Ser: 1.56 mg/dL — ABNORMAL HIGH (ref 0.61–1.24)
GFR, Estimated: 53 mL/min — ABNORMAL LOW (ref >60)
Glucose, Bld: 109 mg/dL — ABNORMAL HIGH (ref 70–99)
Potassium: 4 mmol/L (ref 3.5–5.1)
Sodium: 144 mmol/L (ref 135–145)
Total Bilirubin: 0.3 mg/dL (ref 0.3–1.2)
Total Protein: 5.8 g/dL — ABNORMAL LOW (ref 6.5–8.1)

## 2022-03-09 LAB — CBC
HCT: 30.3 % — ABNORMAL LOW (ref 39.0–52.0)
Hemoglobin: 10.3 g/dL — ABNORMAL LOW (ref 13.0–17.0)
MCH: 32.7 pg (ref 26.0–34.0)
MCHC: 34 g/dL (ref 30.0–36.0)
MCV: 96.2 fL (ref 80.0–100.0)
Platelets: 400 10*3/uL (ref 150–400)
RBC: 3.15 MIL/uL — ABNORMAL LOW (ref 4.22–5.81)
RDW: 12.9 % (ref 11.5–15.5)
WBC: 6.8 10*3/uL (ref 4.0–10.5)
nRBC: 0 % (ref 0.0–0.2)

## 2022-03-09 LAB — CK: Total CK: 459 U/L — ABNORMAL HIGH (ref 49–397)

## 2022-03-09 MED ORDER — NICOTINE 21 MG/24HR TD PT24
21.0000 mg | MEDICATED_PATCH | Freq: Every day | TRANSDERMAL | Status: DC
  Administered 2022-03-09 – 2022-03-11 (×3): 21 mg via TRANSDERMAL
  Filled 2022-03-09 (×3): qty 1

## 2022-03-09 MED ORDER — GABAPENTIN 100 MG PO CAPS
200.0000 mg | ORAL_CAPSULE | Freq: Three times a day (TID) | ORAL | Status: DC
  Administered 2022-03-09 – 2022-03-10 (×3): 200 mg via ORAL
  Filled 2022-03-09 (×3): qty 2

## 2022-03-09 NOTE — Progress Notes (Signed)
PROGRESS NOTE   Subjective/Complaints:  Reports continued pain in her RLE. He says mediations help but he doesn't think they are strong enough. No additional concerns.   Review of Systems  Constitutional:  Negative for chills and fever.  HENT:  Negative for congestion.   Eyes:  Negative for blurred vision and double vision.  Respiratory:  Negative for shortness of breath.   Cardiovascular:  Negative for chest pain and palpitations.  Gastrointestinal:  Negative for abdominal pain, constipation, diarrhea, nausea and vomiting.  Musculoskeletal:  Positive for joint pain.  Neurological:  Positive for weakness.    Objective:   No results found. Recent Labs    03/09/22 0451  WBC 6.8  HGB 10.3*  HCT 30.3*  PLT 400    Recent Labs    03/09/22 0451  NA 144  K 4.0  CL 108  CO2 27  GLUCOSE 109*  BUN 37*  CREATININE 1.56*  CALCIUM 9.2     Intake/Output Summary (Last 24 hours) at 03/09/2022 1259 Last data filed at 03/09/2022 1200 Gross per 24 hour  Intake 1197 ml  Output 3050 ml  Net -1853 ml      Pressure Injury 03/05/22 Tibial Posterior;Right Deep Tissue Pressure Injury - Purple or maroon localized area of discolored intact skin or blood-filled blister due to damage of underlying soft tissue from pressure and/or shear. (Active)  03/05/22 2000  Location: Tibial  Location Orientation: Posterior;Right  Staging: Deep Tissue Pressure Injury - Purple or maroon localized area of discolored intact skin or blood-filled blister due to damage of underlying soft tissue from pressure and/or shear.  Wound Description (Comments):   Present on Admission: Yes    Physical Exam: Vital Signs Blood pressure (!) 142/90, pulse 80, temperature 98 F (36.7 C), temperature source Oral, resp. rate 20, height 6' (1.829 m), weight 65 kg, SpO2 100 %.   General: No acute distress Mood and affect are appropriate Heart: Regular rate and  rhythm no rubs murmurs or extra sounds Lungs: Clear to auscultation, breathing unlabored, no rales or wheezes Abdomen: Positive bowel sounds, soft nontender to palpation, nondistended Extremities: No clubbing, cyanosis, or edema Skin: warm and dry, no breakdown noted  Musculoskeletal: No abdominal tone noted Tenderness throughout RLE greatest around hip and thigh     Strength:                RUE: 5/5 SA, 5/5 EF, 5/5 EE, 5/5 WE, 5/5 FF, 5/5 FA                 LUE: 5/5 SA, 5/5 EF, 5/5 EE, 5/5 WE, 5/5 FF, 5/5 FA                 RLE: 2/5 HF, 3/5 KE, 3+/5 DF, 4/5 EHL, 4/5 PF  - limited by pain proximally but does have foot drop as well on the right side  Reduced sensation RIght dorsal foot                 LLE:  5/5 HF, 5/5 KE, 5/5 DF, 5/5 EHL, 5/5 PF     Assessment/Plan: 1. Functional deficits which require 3+ hours per day of interdisciplinary therapy  in a comprehensive inpatient rehab setting. Physiatrist is providing close team supervision and 24 hour management of active medical problems listed below. Physiatrist and rehab team continue to assess barriers to discharge/monitor patient progress toward functional and medical goals  Care Tool:  Bathing    Body parts bathed by patient: Chest, Right arm, Left arm, Abdomen, Front perineal area, Left upper leg, Right upper leg, Face   Body parts bathed by helper: Right lower leg, Buttocks, Left lower leg     Bathing assist Assist Level: Minimal Assistance - Patient > 75%     Upper Body Dressing/Undressing Upper body dressing   What is the patient wearing?: Pull over shirt    Upper body assist Assist Level: Contact Guard/Touching assist    Lower Body Dressing/Undressing Lower body dressing            Lower body assist Assist for lower body dressing: Moderate Assistance - Patient 50 - 74%     Toileting Toileting    Toileting assist Assist for toileting: Independent with assistive device Assistive Device Comment: urinal    Transfers Chair/bed transfer  Transfers assist     Chair/bed transfer assist level: Contact Guard/Touching assist Chair/bed transfer assistive device: Programmer, multimedia   Ambulation assist      Assist level: Contact Guard/Touching assist Assistive device: Walker-rolling Max distance: 62ft   Walk 10 feet activity   Assist     Assist level: Contact Guard/Touching assist Assistive device: Walker-rolling   Walk 50 feet activity   Assist    Assist level: Contact Guard/Touching assist Assistive device: Walker-rolling    Walk 150 feet activity   Assist Walk 150 feet activity did not occur: Safety/medical concerns         Walk 10 feet on uneven surface  activity   Assist     Assist level: Contact Guard/Touching assist Assistive device: Walker-rolling   Wheelchair     Assist Is the patient using a wheelchair?: Yes Type of Wheelchair: Manual    Wheelchair assist level: Supervision/Verbal cueing Max wheelchair distance: 73ft    Wheelchair 50 feet with 2 turns activity    Assist        Assist Level: Total Assistance - Patient < 25%   Wheelchair 150 feet activity     Assist      Assist Level: Total Assistance - Patient < 25%   Blood pressure (!) 142/90, pulse 80, temperature 98 F (36.7 C), temperature source Oral, resp. rate 20, height 6' (1.829 m), weight 65 kg, SpO2 100 %.  Medical Problem List and Plan: 1. 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 Siblings  -Continue CIR with PT/OT  2.  Antithrombotics: -DVT/anticoagulation:  Pharmaceutical: Heparin             -antiplatelet therapy:  Aspirin 81 mg daily and Plavix 75 mg day x21 days  stopping 03/26/2022 then ASA alone for CVA prophylaxis  3. Pain Management: Oxycodone as needed taking 60mg  per day              - Previously  on dilaudid for breakthrough             - Endorses on Buprenorphine as OP although didn't see this recently on PDMP review- pt will need to give name of prescriber as he gets closer to discharge    -11/10 Will add 100mg  gabapentin TID and dc  Voltaren gel  11/12 trial Lidoderm patch , start in am  11/13 increase gabapentin to 200mg  TID  4. Mood/Behavior/Sleep: Provide emotional support             -antipsychotic agents: N/A 5. Neuropsych/cognition: This patient is capable of making decisions on his own behalf. 6. Skin/Wound Care/right outer calf pressure ulcer: Routine skin checks.  WOC follow-up wound care 7. Fluids/Electrolytes/Nutrition: Routine in and outs with follow-up chemistries 8.  Severe rhabdo/AKI.  Follow-up nephrology services no current plan for hemodialysis.                        - Latest CK 2338/CRT 2.76                    - On 100 ml/hr IVF for maintenance - should be able to wean now that CK <5k; may get additional input from Nephrology                     - Strict Is and Os via foley - goal 200 cc/hr output                    - 11/10 Cr Down to 2.3, recheck CK with next labs    -11/13 AkI improving CR down to 1.56/BUN 37,improved, continue to follow and encourage fluid intake   -11/13 CK town from 2,338 11/8 to 459 today, improved, continue to monitor 9.  Elevated LFTs.  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  HCV RNA elevated.  ID to schedule follow-up outpatient 1 month  -LFTs improved on CMP 03/06/22, continue to monitor 10.  Severe muscle inflammation of right hip and gluteal muscle/crush injury.  Follow-up orthopedic service Dr. Stann Mainland.  No surgical intervention at this time.  Weightbearing as tolerated 11.  History of polysubstance + tobacco abuse.  Provide counseling.  Urine drug screen was positive  for cocaine.                  - Pt endorses Hx prior SCI 2020; endorses no IVDU since then, no residual deficits                  - On buprenorphine as OP - pt will need to f/u at that clinic post discharge  12.  Hypertension.  Norvasc 10 mg daily, clonidine 0.2 mg twice daily, Lopressor 100 mg twice daily.  Monitor with increased mobility   Vitals:   03/09/22 0318 03/09/22 1253  BP: 136/82 (!) 142/90  Pulse: 69 80  Resp: 18 20  Temp: 97.6 F (36.4 C) 98 F (36.7 C)  SpO2: 96% 100%  Cont to monitor may need to increase clonidine dose if BP remains elevated  11/13 improved, intermittently elevated, follow trend  13. Anemia   -Down to 10.3 today, may partially be dilutional, continue to monitor HGB   LOS: 4 days A FACE TO FACE EVALUATION WAS PERFORMED  Jennye Boroughs 03/09/2022, 12:59 PM

## 2022-03-09 NOTE — Progress Notes (Signed)
Physical Therapy Session Note  Patient Details  Name: Franklin Anderson MRN: [id number] Date of Birth: 08-Jan-1971  Today's Date: 03/09/2022 PT Individual Time: 1015-1045  PT Individual Time Calculation (min): 30 min   Short Term Goals: Week 1:  PT Short Term Goal 1 (Week 1): = to LTG due to ELOS  Skilled Therapeutic Interventions/Progress Updates:      1st session: Pt sitting in w/c - agreeable to therapy session. Pt is aware of his scheduled pain medication, due around 10:30. RN notified during session and pt received pain Rx for his R hip. Pt perseverating on his pain and reporting lack of sufficient pain medication. Reports frutration with recommendations for lidocaine patches or voltaren gels. Redirection provided as needed during session.  Pt propelled himself in w/c at mod I level to day room rehab gym, ~172f. Gait training aroud nurses station ~1646fwith supervision and RW. Gait antalgic with decreased wight shift to R, R toe drag due to decreased DF (strength 2-/5).   Assisted on Kinetron and completed 3 x 2.5 minutes with rest breaks b/w sets with resistance set to 50 cm/sec. Emphasis on full ROM for R side - pt with slow stepping but able to copmlete. Reports he hasn't done an exercise yet that caused him this much pain.   Pt returned to his room in w/c for time. Concluded session in w/c with all needs met. NT in room changing bed linen.    Therapy Documentation Precautions:  Precautions Precautions: Fall Precaution Comments: scrotal edema, severe right hip pain with all mobility. Restrictions Weight Bearing Restrictions: No General:   Vital Signs:    Mobility:   Locomotion :    Trunk/Postural Assessment :    Balance:   Exercises:   Other Treatments:      Therapy/Group: Individual Therapy  [name]1/13/2023, 7:35 AM

## 2022-03-09 NOTE — Progress Notes (Signed)
Occupational Therapy Session Note  Patient Details  Name: Franklin Anderson MRN: [id number] Date of Birth: 1971/01/28  Session 1 Today's Date: 03/09/2022 OT Individual Time: 5697-9480 OT Individual Time Calculation (min): 15 min  and Today's Date: 03/09/2022 OT Missed Time: 30 Minutes Missed Time Reason: Patient unwilling/refused to participate without medical reason   Session 2 Today's Date: 03/09/2022 OT Individual Time: 1407-1501 OT Individual Time Calculation (min): 54 min     Short Term Goals: Week 1:  OT Short Term Goal 1 (Week 1): Pt will tolerate standing 3 minutes during functional activities OT Short Term Goal 2 (Week 1): Pt will demonstrate learning of 2 pain management techniques with min A OT Short Term Goal 3 (Week 1): Pt will complete U/LB bathing with CGA OT Short Term Goal 4 (Week 1): Pt will complete LB dressing with min A  Skilled Therapeutic Interventions/Progress Updates:     Session 1  Pt received supine, upon entry pt visibly upset and stating "you're early". OT informed pt session began at 7:30. He stated "no that is way too early, you can come back at 8". Informed scheduling of pt request to start at 8 am. Initial 30 min of session missed. Upon re-entry to room pt c/p 10/10 pain in his R hip and stating that his "doctors are not listening to him". He was willing to participate despite pain. RN reporting pt is premedicated. He came to EOB with (S). Shirt and pants doffed and donned with (S). He completed sit > stand from EOB with the RW with (S). He was able to remain standing for 5+ min as he voided urine in standing and completed hand hygiene at the sink. Several steps taken in room to transfer to the w/c with (S). Pt was left sitting up in the w/c with all needs met and call bell within reach.    Session 2  Pt received supine in high levels of R hip pain, unrated. He stated to OT "my pain medicine is due at 2:37". He came to EOB with min cueing and mod I.  He stood from EOB and used the urinal with mod I. Ambulatory transfer to the w/c with the RW with (S). He was able to propel the w/c 125 ft to the therapy gym with (S). Transfer to the NuStep with (S). Nustep used to promote increase cardiorespiratory endurance and flexibility/mobility of his R hip to promote increased independence with ADLs and transfers. He completed 10 min on level 1 resistance. RN did bring pain mediation during session. He completed 15 ft of functional mobility to the mat where he sat unsupported for UE focus the remainder of the session. He completed 3x10 repetitions of tricep extension superset with bicep curls. He then did superset chest press seated with overhead press, superset 3x8 repetitions. All UE exercises performed to promote increased stability and endurance of UE support on the RW and for w/c mobility. Pt completed functional mobility back to his room, initially with the RW with (S) and then without a device with CGA. Pt was left sitting up with all needs within reach.     Therapy Documentation Precautions:  Precautions Precautions: Fall Precaution Comments: scrotal edema, severe right hip pain with all mobility. Restrictions Weight Bearing Restrictions: No  Therapy/Group: Individual Therapy  Curtis Sites 03/09/2022, 6:49 AM

## 2022-03-09 NOTE — Progress Notes (Signed)
Occupational Therapy Session Note  Patient Details  Name: Franklin Anderson MRN: [id number] Date of Birth: October 06, 1970  Today's Date: 03/09/2022 OT Individual Time: 1050-1200 OT Individual Time Calculation (min): 70 min    Short Term Goals: Week 1:  OT Short Term Goal 1 (Week 1): Pt will tolerate standing 3 minutes during functional activities OT Short Term Goal 2 (Week 1): Pt will demonstrate learning of 2 pain management techniques with min A OT Short Term Goal 3 (Week 1): Pt will complete U/LB bathing with CGA OT Short Term Goal 4 (Week 1): Pt will complete LB dressing with min A  Skilled Therapeutic Interventions/Progress Updates:    Pt received in wc agreeable to a shower.   Retrieved more disposable scrub clothing for pt and he got new underwear out of his bag.  Pt was overall S with all tasks, except for A with R socks. He had edema in R ankle so applied TED hose.  He was able to tolerate walking in and out of bathroom with RW with S and stood for several min to use urinal. Post dressing, he wanted to try to take a few steps without RW he was able to go a few steps but was clearly in more pain.  Recommended he keep using RW for support until his hip pain subsides a great deal.   Pt resting in wc with all needs met.    Therapy Documentation Precautions:  Precautions Precautions: Fall Precaution Comments: scrotal edema, severe right hip pain with all mobility. Restrictions Weight Bearing Restrictions: No   Pain: Pt stated that his hip pain was 8/10 - RN and MD aware ADL: ADL Eating: Independent Where Assessed-Eating: Edge of bed Grooming: Independent Where Assessed-Grooming: Standing at sink Upper Body Bathing: Independent Where Assessed-Upper Body Bathing: Shower Lower Body Bathing: Setup Where Assessed-Lower Body Bathing: Shower Upper Body Dressing: Independent Where Assessed-Upper Body Dressing: Chair Lower Body Dressing: Minimal assistance (min A for R sock) Where  Assessed-Lower Body Dressing: Chair Toileting: Supervision/safety Where Assessed-Toileting: Glass blower/designer: Close supervision Toilet Transfer Method: Counselling psychologist: Energy manager: Close supervision Social research officer, government Method: Heritage manager: Radio broadcast assistant      Therapy/Group: Individual Therapy  Maikol Grassia 03/09/2022, 12:36 PM

## 2022-03-10 MED ORDER — GABAPENTIN 300 MG PO CAPS
300.0000 mg | ORAL_CAPSULE | Freq: Three times a day (TID) | ORAL | Status: DC
  Administered 2022-03-10 – 2022-03-13 (×9): 300 mg via ORAL
  Filled 2022-03-10 (×9): qty 1

## 2022-03-10 MED ORDER — NORTRIPTYLINE HCL 10 MG PO CAPS
10.0000 mg | ORAL_CAPSULE | Freq: Every day | ORAL | Status: DC
  Administered 2022-03-10: 10 mg via ORAL
  Filled 2022-03-10: qty 1

## 2022-03-10 NOTE — Progress Notes (Signed)
Occupational Therapy Session Note  Patient Details  Name: Franklin Anderson MRN: [id number] Date of Birth: 1970-08-27  Today's Date: 03/10/2022 OT Individual Time: 0850-1015 OT Individual Time Calculation (min): 85 min    Short Term Goals: Week 1:  OT Short Term Goal 1 (Week 1): Pt will tolerate standing 3 minutes during functional activities OT Short Term Goal 2 (Week 1): Pt will demonstrate learning of 2 pain management techniques with min A OT Short Term Goal 3 (Week 1): Pt will complete U/LB bathing with CGA OT Short Term Goal 4 (Week 1): Pt will complete LB dressing with min A  Skilled Therapeutic Interventions/Progress Updates:  Pt greeted supine in bed, pt  agreeable to OT intervention. Session focus on BADL reeducation, functional mobility, dynamic standing balance, BUE strength/endurance and decreasing overall caregiver burden.   Pt completed supine>sit MODI and completed ambulatory ADL transfer to sink with Rw and supervision. Pt stood to complete grooming tasks at sink with no UE support and supervision.   Pt transported to ADL apt for time mgmt. Pt completed ambulatory shower transfer to walkin shower with pt holding onto handle on L side ( per his shower set- up at home) with supervision, pt has shower seat at home.  Pt also completed ambulatory transfers in apt to couch and flat bed with rw and supervision, pt did need assist to elevate RLE onto bed. Education provided on using gait belt as leg lifter as needed.   Pt completed > 100 ft of functional ambulation in hallway to facilitate improved endurance for higher level ADLs with Rw and supervision, no LOB with close chair follow.  Worked on seated BUE strength/endurance for higher level functional mobility tasks. Pt completed below therex: X20 bicep curls with 9 lb hand weights  X20 chest presses with 9 lb weights  Graded task up and provided dual task with pt Standing on airex cushion to challenge balance and pt completing  front raises with 5 lb weights, pt also completed  X20 front raises with 5 lb weights  Pt also able to Pass one 5 lb weight around back in standing to challenge dynamic balance and simulate toileting tasks with no LOB and supervision.    Standing balance on airex with catching and releasing ball, one LOB but able to recover with no external assist.   Pt also able to stand at "thankful tree" to reach out of BOS to write on leaf what pt was thankful for.   Ended session with pt supine in bed with all needs within reach.   Therapy Documentation Precautions:  Precautions Precautions: Fall Precaution Comments: scrotal edema, severe right hip pain with all mobility. Restrictions Weight Bearing Restrictions: No   Pain: 9/10 pain reported in R hip/knee, rest breaks provided as needed.     Therapy/Group: Individual Therapy  Pollyann Glen [facility] 03/10/2022, 10:19 AM

## 2022-03-10 NOTE — Progress Notes (Signed)
Physical Therapy Session Note  Patient Details  Name: Franklin Anderson MRN: [id number] Date of Birth: Aug 31, 1970  Today's Date: 03/10/2022 PT Individual Time: 1416-1530 PT Individual Time Calculation (min): 74 min   Short Term Goals: Week 1:  PT Short Term Goal 1 (Week 1): = to LTG due to ELOS  Skilled Therapeutic Interventions/Progress Updates: Pt presents supine in bed and agreeable to therapy.  Pt transfers sup to sit w/ supervision.  Pt transfers sit to stand w/ supervision throughout session.  Pt amb multiple trials w/ RW and supervision during session.  RW height adjusted for improved positioning and decreased antalgic gait.  Pt negotiated on ramped surface w/ supervision.  Pt negotiated through cone obstacle course and then stepping over hockey sticks.  Pt performed standing hooking horseshoes over hoop using B UE s simultaneously.  Pt amb to room and performed stand to sit to supine transfers w/ supervision.  Bed alarm on and all needs in reach.      Therapy Documentation Precautions:  Precautions Precautions: Fall Precaution Comments: scrotal edema, severe right hip pain with all mobility. Restrictions Weight Bearing Restrictions: No General:   Vital Signs: Therapy Vitals Temp: 98.4 F (36.9 C) Temp Source: Oral Pulse Rate: 86 Resp: 18 BP: (!) 147/92 Patient Position (if appropriate): Lying Oxygen Therapy SpO2: 100 % O2 Device: Room Air Pain:6/10 Pain Assessment Pain Score: 6  Mobility:    Therapy/Group: Individual Therapy  Lucio Edward 03/10/2022, 3:57 PM

## 2022-03-10 NOTE — Progress Notes (Signed)
Physical Therapy Note  Patient Details  Name: Franklin Anderson MRN: [id number] Date of Birth: 06-03-1970 Today's Date: 03/10/2022    Missed PT session x 45' 2/2 therapist unavailable for session.   Lucio Edward 03/10/2022, 12:13 PM

## 2022-03-10 NOTE — Progress Notes (Signed)
PROGRESS NOTE   Subjective/Complaints:  Mainly nocturnal pain shooting down the RIght leg  Numbness in RIght foot , top=bottom of foot No bowel or bladder issues   Review of Systems  Constitutional:  Negative for chills and fever.  HENT:  Negative for congestion.   Eyes:  Negative for blurred vision and double vision.  Respiratory:  Negative for shortness of breath.   Cardiovascular:  Negative for chest pain and palpitations.  Gastrointestinal:  Negative for abdominal pain, constipation, diarrhea, nausea and vomiting.  Musculoskeletal:  Positive for joint pain.  Neurological:  Positive for weakness.    Objective:   No results found. Recent Labs    03/09/22 0451  WBC 6.8  HGB 10.3*  HCT 30.3*  PLT 400    Recent Labs    03/09/22 0451  NA 144  K 4.0  CL 108  CO2 27  GLUCOSE 109*  BUN 37*  CREATININE 1.56*  CALCIUM 9.2     Intake/Output Summary (Last 24 hours) at 03/10/2022 0905 Last data filed at 03/10/2022 0600 Gross per 24 hour  Intake 237 ml  Output 4200 ml  Net -3963 ml      Pressure Injury 03/05/22 Tibial Posterior;Right Deep Tissue Pressure Injury - Purple or maroon localized area of discolored intact skin or blood-filled blister due to damage of underlying soft tissue from pressure and/or shear. (Active)  03/05/22 2000  Location: Tibial  Location Orientation: Posterior;Right  Staging: Deep Tissue Pressure Injury - Purple or maroon localized area of discolored intact skin or blood-filled blister due to damage of underlying soft tissue from pressure and/or shear.  Wound Description (Comments):   Present on Admission: Yes    Physical Exam: Vital Signs Blood pressure (!) 163/92, pulse 81, temperature (!) 97.5 F (36.4 C), temperature source Oral, resp. rate 19, height 6' (1.829 m), weight 65 kg, SpO2 96 %.   General: No acute distress Mood and affect are appropriate Heart: Regular rate and  rhythm no rubs murmurs or extra sounds Lungs: Clear to auscultation, breathing unlabored, no rales or wheezes Abdomen: Positive bowel sounds, soft nontender to palpation, nondistended Extremities: No clubbing, cyanosis, or edema Skin: warm and dry, no breakdown noted  Musculoskeletal: No abdominal tone noted Tenderness throughout RLE greatest around hip and thigh     Strength:                RUE: 5/5 SA, 5/5 EF, 5/5 EE, 5/5 WE, 5/5 FF, 5/5 FA                 LUE: 5/5 SA, 5/5 EF, 5/5 EE, 5/5 WE, 5/5 FF, 5/5 FA                 RLE: 2/5 HF, 3/5 KE, 3+/5 DF, 4/5 EHL, 4/5 PF  - limited by pain proximally but does have foot drop as well on the right side  Reduced sensation RIght dorsal> plantar foot                 LLE:  5/5 HF, 5/5 KE, 5/5 DF, 5/5 EHL, 5/5 PF     Assessment/Plan: 1. Functional deficits which require 3+ hours per day  of interdisciplinary therapy in a comprehensive inpatient rehab setting. Physiatrist is providing close team supervision and 24 hour management of active medical problems listed below. Physiatrist and rehab team continue to assess barriers to discharge/monitor patient progress toward functional and medical goals  Care Tool:  Bathing    Body parts bathed by patient: Chest, Right arm, Left arm, Abdomen, Front perineal area, Left upper leg, Right upper leg, Face   Body parts bathed by helper: Right lower leg, Buttocks, Left lower leg     Bathing assist Assist Level: Minimal Assistance - Patient > 75%     Upper Body Dressing/Undressing Upper body dressing   What is the patient wearing?: Pull over shirt    Upper body assist Assist Level: Contact Guard/Touching assist    Lower Body Dressing/Undressing Lower body dressing            Lower body assist Assist for lower body dressing: Moderate Assistance - Patient 50 - 74%     Toileting Toileting    Toileting assist Assist for toileting: Independent with assistive device Assistive Device Comment:  urinal   Transfers Chair/bed transfer  Transfers assist     Chair/bed transfer assist level: Contact Guard/Touching assist Chair/bed transfer assistive device: Geologist, engineering   Ambulation assist      Assist level: Contact Guard/Touching assist Assistive device: Walker-rolling Max distance: 25ft   Walk 10 feet activity   Assist     Assist level: Contact Guard/Touching assist Assistive device: Walker-rolling   Walk 50 feet activity   Assist    Assist level: Contact Guard/Touching assist Assistive device: Walker-rolling    Walk 150 feet activity   Assist Walk 150 feet activity did not occur: Safety/medical concerns         Walk 10 feet on uneven surface  activity   Assist     Assist level: Contact Guard/Touching assist Assistive device: Walker-rolling   Wheelchair     Assist Is the patient using a wheelchair?: Yes Type of Wheelchair: Manual    Wheelchair assist level: Supervision/Verbal cueing Max wheelchair distance: 61ft    Wheelchair 50 feet with 2 turns activity    Assist        Assist Level: Total Assistance - Patient < 25%   Wheelchair 150 feet activity     Assist      Assist Level: Total Assistance - Patient < 25%   Blood pressure (!) 163/92, pulse 81, temperature (!) 97.5 F (36.4 C), temperature source Oral, resp. rate 19, height 6' (1.829 m), weight 65 kg, SpO2 96 %.  Medical Problem List and Plan: 1. 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 Siblings  -Continue CIR with PT/OT  2.  Antithrombotics: -DVT/anticoagulation:  Pharmaceutical: Heparin             -antiplatelet therapy:  Aspirin 81 mg daily and Plavix 75 mg  day x21 days stopping 03/26/2022 then ASA alone for CVA prophylaxis  3. Pain Management: Oxycodone as needed taking 60mg  per day              -  Previously on dilaudid for breakthrough             - Endorses on Buprenorphine as OP although didn't see this recently on PDMP review- pt will need to give name of prescriber as he gets closer to discharge    -11/10 Will add 100mg  gabapentin TID and dc  Voltaren gel  11/12 trial Lidoderm patch , start in am  11/13 increase gabapentin to 300mg  TID Add nortriptyline at noc  4. Mood/Behavior/Sleep: Provide emotional support             -antipsychotic agents: N/A 5. Neuropsych/cognition: This patient is capable of making decisions on his own behalf. 6. Skin/Wound Care/right outer calf pressure ulcer: Routine skin checks.  WOC follow-up wound care 7. Fluids/Electrolytes/Nutrition: Routine in and outs with follow-up chemistries 8.  Severe rhabdo/AKI.  Follow-up nephrology services no current plan for hemodialysis.                        - Latest CK 2338/CRT 2.76                    - On 100 ml/hr IVF for maintenance - should be able to wean now that CK <5k; may get additional input from Nephrology                     - Strict Is and Os via foley - goal 200 cc/hr output                    - 11/10 Cr Down to 2.3, recheck CK with next labs    -11/13 AkI improving CR down to 1.56/BUN 37,improved, continue to follow and encourage fluid intake   -11/13 CK town from 2,338 11/8 to 459 today, improved, continue to monitor 9.  Elevated LFTs.  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  HCV RNA elevated.  ID to schedule follow-up outpatient 1 month  -LFTs improved on CMP 03/06/22, continue to monitor 10.  Severe muscle inflammation of right hip and gluteal muscle/crush injury.  Follow-up orthopedic service Dr. 13/8.  No surgical intervention at this time.  Weightbearing as tolerated 11.  History of polysubstance + tobacco abuse.  Provide  counseling.  Urine drug screen was positive for cocaine.                  - Pt endorses Hx prior SCI 2020; endorses no IVDU since then, no residual deficits                  - On buprenorphine as OP - pt will need to f/u at that clinic post discharge  12.  Hypertension.  Norvasc 10 mg daily, clonidine 0.2 mg twice daily, Lopressor 100 mg twice daily.  Monitor with increased mobility   Vitals:   03/09/22 1951 03/10/22 0410  BP: (!) 162/95 (!) 163/92  Pulse: 100 81  Resp: 18 19  Temp: 98.6 F (37 C) (!) 97.5 F (36.4 C)  SpO2: 98% 96%  Cont to monitor may need to increase clonidine dose if BP remains elevated  11/13 improved, intermittently elevated, follow trend  13. Anemia   -Down to 10.3 today, may partially be dilutional, continue to monitor HGB   LOS: 5 days A FACE TO FACE EVALUATION WAS PERFORMED  03/11/22 03/10/2022, 9:05 AM

## 2022-03-10 NOTE — Progress Notes (Signed)
Occupational Therapy Session Note  Patient Details  Name: Franklin Anderson MRN: [id number] Date of Birth: 25-Aug-1970  Today's Date: 03/10/2022 OT Individual Time: 2111-7356 OT Individual Time Calculation (min): 30 min    Short Term Goals: Week 1:  OT Short Term Goal 1 (Week 1): Pt will tolerate standing 3 minutes during functional activities OT Short Term Goal 2 (Week 1): Pt will demonstrate learning of 2 pain management techniques with min A OT Short Term Goal 3 (Week 1): Pt will complete U/LB bathing with CGA OT Short Term Goal 4 (Week 1): Pt will complete LB dressing with min A  Skilled Therapeutic Interventions/Progress Updates:    Pt greeted seated supine in bed and agreeable to OT treatment session. Pt reported need to go to the bathroom with RW and CGA. Pt stood with RW to urinate with CGA. Pt propelled wc to therapy gym and completed stand-pivot with CGA A without AD. Pt sat on therapy mat for UB there-ex. 3 sets of 10 chest press, bicep curl, straight arm raise, and seated row using 4 lb weighted bar. 9/10 hip pain with seated row and increased anterior pelvic tilt. Stand-pivot back to wc with CGA. Pt propelled wc back to room then ambulated 5 feet back to bed with RW and CGA. Pt returned to bed and left semi-reclined with bed alarm on, call bell in reach, and needs met.   Therapy Documentation Precautions:  Precautions Precautions: Fall Precaution Comments: scrotal edema, severe right hip pain with all mobility. Restrictions Weight Bearing Restrictions: No  Pain:  9/10 hip pain, rest and repositioned   Therapy/Group: Individual Therapy  Valma Cava 03/10/2022, 1:21 PM

## 2022-03-11 MED ORDER — NORTRIPTYLINE HCL 25 MG PO CAPS
25.0000 mg | ORAL_CAPSULE | Freq: Every day | ORAL | Status: DC
  Administered 2022-03-11 – 2022-03-12 (×2): 25 mg via ORAL
  Filled 2022-03-11 (×2): qty 1

## 2022-03-11 MED ORDER — NICOTINE 14 MG/24HR TD PT24
14.0000 mg | MEDICATED_PATCH | Freq: Every day | TRANSDERMAL | Status: DC
  Administered 2022-03-12 – 2022-03-13 (×2): 14 mg via TRANSDERMAL
  Filled 2022-03-11 (×2): qty 1

## 2022-03-11 NOTE — Progress Notes (Signed)
Occupational Therapy Discharge Summary  Patient Details  Name: Franklin Anderson MRN: [id number] Date of Birth: 1970-08-02  Date of Discharge from OT service:March 14, 2022   Patient has met 9 of 9 long term goals due to improved activity tolerance, improved balance, postural control, and ability to compensate for deficits.  Patient to discharge at overall Modified Independent level.  Patient's care partner is independent to provide the necessary physical assistance at discharge.  No formal family education with wife as pt is competent.  Reasons goals not met: n/a  Recommendation:  No further OT services needed.   Equipment: No equipment provided - pt has a shower seat  Reasons for discharge: treatment goals met  Patient/family agrees with progress made and goals achieved: Yes  OT Discharge Precautions/Restrictions  Precautions Precautions: Fall Restrictions Weight Bearing Restrictions: No   ADL ADL Eating: Independent Where Assessed-Eating: Edge of bed Grooming: Independent Where Assessed-Grooming: Standing at sink Upper Body Bathing: Independent Where Assessed-Upper Body Bathing: Shower Lower Body Bathing: Modified independent Where Assessed-Lower Body Bathing: Shower Upper Body Dressing: Independent Where Assessed-Upper Body Dressing: Chair Lower Body Dressing: Modified independent (pt able to don socks.) Where Assessed-Lower Body Dressing: Chair Toileting: Modified independent Where Assessed-Toileting: Glass blower/designer: Diplomatic Services operational officer Method: Counselling psychologist: Retail buyer Method: Heritage manager: Gaffer Baseline Vision/History: 0 No visual deficits Patient Visual Report: No change from baseline Vision Assessment?: No apparent visual deficits Perception   Perception: Within Functional Limits Praxis Praxis: Intact Cognition Cognition Overall Cognitive Status: Within Functional Limits for tasks assessed Arousal/Alertness: Awake/alert Orientation Level: Person;Place;Situation Person: Oriented Place: Oriented Situation: Oriented Memory: Appears intact Awareness: Appears intact Problem Solving: Appears intact Safety/Judgment: Appears intact Brief Interview for Mental Status (BIMS) Repetition of Three Words (First Attempt): 3 Temporal Orientation: Year: Correct Temporal Orientation: Month: Accurate within 5 days Temporal Orientation: Day: Correct Recall: "Sock": Yes, no cue required Recall: "Blue": Yes, no cue required Recall: "Bed": Yes, no cue required BIMS Summary Score: 15 Sensation Sensation Light Touch: Appears Intact Hot/Cold: Appears Intact Proprioception: Appears Intact Stereognosis: Appears Intact Additional Comments: numbness and tingling throughout R LE, decreased light touch sensation in R foot compared to L foot. Coordination Gross Motor Movements are Fluid and Coordinated: No Fine Motor Movements are Fluid and Coordinated: Yes Coordination and Movement Description: Decreased movement in R LE due to pain. Coordinated in L LE. Motor  Motor Motor - Discharge Observations: WFL in UEs Mobility  Bed Mobility Rolling Right: Independent Supine to Sit: Independent Sit to Supine: Independent with assistive device (uses belt as a leg lifter)  Trunk/Postural Assessment  Cervical Assessment Cervical Assessment: Within Functional Limits Thoracic Assessment Thoracic Assessment: Within Functional Limits Lumbar Assessment Lumbar Assessment: Within Functional Limits Postural Control Postural Control: Within Functional Limits  Balance Static Sitting Balance Static Sitting - Level of Assistance: 7: Independent Dynamic Sitting Balance Dynamic Sitting - Level of Assistance: 7: Independent Static Standing Balance Static  Standing - Level of Assistance: 6: Modified independent (Device/Increase time) Dynamic Standing Balance Dynamic Standing - Level of Assistance: 6: Modified independent (Device/Increase time) Extremity/Trunk Assessment RUE Assessment RUE Assessment: Within Functional Limits LUE Assessment LUE Assessment: Within Functional Limits   SAGUIER,JULIA 03/11/2022, 12:32 PM

## 2022-03-11 NOTE — Progress Notes (Signed)
Occupational Therapy Session Note  Patient Details  Name: Franklin Anderson MRN: [id number] Date of Birth: Feb 21, 1971  Today's Date: 03/11/2022 OT Individual Time: 1950-9326 OT Individual Time Calculation (min): 45 min    Short Term Goals: Week 1:  OT Short Term Goal 1 (Week 1): Pt will tolerate standing 3 minutes during functional activities OT Short Term Goal 2 (Week 1): Pt will demonstrate learning of 2 pain management techniques with min A OT Short Term Goal 3 (Week 1): Pt will complete U/LB bathing with CGA OT Short Term Goal 4 (Week 1): Pt will complete LB dressing with min A  Skilled Therapeutic Interventions/Progress Updates:    Pt received in bed and sat to EOB, stood to RW independently. Mod I to walk to bathroom, doff clothing and get in shower.  Showered on seat with mod I. He then dressed with mod I.  Pt ambulated to wc to rest. Able to place leg rests on himself.  Discussed how his wife has gotten a Office manager seat for walkin shower and he feels he will be ready to go home soon.  Pt would like to go home before his birthday.   Pt continues to have R leg pain but is able to overcome it to be able to accomplish his self care and mobility.  Pt was made mod I in the room.  Pt in agreement and is aware to call for help should he feel dizzy or have extreme pain.   Therapy Documentation Precautions:  Precautions Precautions: Fall Precaution Comments: scrotal edema, severe right hip pain with all mobility. Restrictions Weight Bearing Restrictions: No    Vital Signs: Therapy Vitals Pulse Rate: 88 BP: (Abnormal) 161/98 Pain: Pain Assessment Pain Scale: 0-10 Pain Score: 9  Pain Type: Acute pain Pain Location: Hip Pain Orientation: Right Pain Radiating Towards: knee Pain Descriptors / Indicators: Aching Pain Frequency: Constant Pain Onset: On-going Pain Intervention(s): Medication (See eMAR) ADL: ADL Eating: Independent Where Assessed-Eating: Edge of  bed Grooming: Independent Where Assessed-Grooming: Standing at sink Upper Body Bathing: Independent Where Assessed-Upper Body Bathing: Shower Lower Body Bathing: Modified independent Where Assessed-Lower Body Bathing: Shower Upper Body Dressing: Independent Where Assessed-Upper Body Dressing: Chair Lower Body Dressing: Modified independent (pt able to don socks.) Where Assessed-Lower Body Dressing: Chair Toileting: Modified independent Where Assessed-Toileting: Teacher, adult education: Engineer, agricultural Method: Proofreader: Engineer, technical sales: Cytogeneticist: Cytogeneticist Method: Designer, industrial/product: Emergency planning/management officer      Therapy/Group: Individual Therapy  Chayim Bialas 03/11/2022, 10:19 AM

## 2022-03-11 NOTE — Progress Notes (Signed)
Physical Therapy Session Note  Patient Details  Name: Franklin Anderson MRN: [id number] Date of Birth: [birth year]  Today's Date: 03/11/2022 PT Individual Time: 8466 - 5993  PT Individual Time Calculation (min): 42 min  Short Term Goals: Week 1:  PT Short Term Goal 1 (Week 1): = to LTG due to ELOS  Skilled Therapeutic Interventions/Progress Updates:  Pt supine in bed on entrance. Pt alert and agreeable to treatment session. Pt pain level remains the same to previous treatment session.  Therapeutic Activity:  Transfers: Patient performed sit<> stand, stand pivot, and ambulatory transfers at Mod I with intermittent cuing for RW and chair postioning for increased safety  LE strengthening: The following was performed w/ red resistance band with emphasis on glute activation and improved activity tolerance. Red band was positioned above the knee. Pt was instructed to maintain resistance within the band during movements.  Alt lateral steps (1 x 8 ea), alt backward steps (1 x 8 ea), seated hip abduction.   Gait: Patient was ambulatory throughout treatment session andcompleted a distance of 220 ft before requiring rest. Pt provided with foot brace to improve foot drop and given intermittent verbal cues for right knee elevation. Pt demonstrated 3 attempts of stepping  on to and down 4 inch curb step using RW with close supervision, and another two attempts with curb step followed by gait on uneven surface ( purple exercise mat) with RW.   Neuromuscular Re-education: NMR was performed to improve coordination, activity tolerance, sequence, strength, confidence and judgement in all aspect of mobility at the highest level.    Stairs Pt completed stair navigation progression w/ 4 attempts of stepping up and down 6 inch steps without breaks, and w/ 3 attempts of stepping up 6 inch steps and down 4 inch steps  without breaks. Pt complete stairs using both hand rails with close supervision. Pt initially used  a step to pattern but progressed to step through pattern after the second attempt.   Balance:  Pt demonstrated throwing 8 bean bags on to corn hole board, 3 attempts standing on even surface and two attempts standing on uneven surface ( blue foam pad) without RW and with close supervision. Provided intermittent cuing for maintaining upright posture.  At parallel bars:  Pt completed progression of walking forwards and backwards x3, forwards and backwards while stepping over rectangular wedge and red rectangular board x3 w/ close supervision. Pt complete first attempted using both hand rails, performed the remaining attempts with only using left hand rail. Provided verbal cuing for foot positioning and maintaining upright posture during backward walking.  Pt ambulated back to room and requested to get into bed. Pt performed ambulatory transfer to EOB and transitioned from sit to supine at Mod I.  Pt in supine with wife present, call/ bell close by and all needs met.   Therapy Documentation Precautions:  Precautions Precautions: Fall Precaution Comments: scrotal edema, severe right hip pain with all mobility. Restrictions Weight Bearing Restrictions: No General:   Vital Signs: Therapy Vitals Temp: 98.8 F (37.1 C) Temp Source: Oral Pulse Rate: 90 Resp: 18 BP: (!) 138/97 Patient Position (if appropriate): Sitting Oxygen Therapy SpO2: 97 % O2 Device: Room Air Pain: Pain Assessment Pain Scale: 0-10 Pain Score: 9  Pain Type: Acute pain Pain Location: Hip Pain Orientation: Right Pain Radiating Towards: knee Pain Descriptors / Indicators: Aching Pain Frequency: Constant Pain Onset: On-going Pain Intervention(s): Medication (See eMAR) Mobility: Bed Mobility Rolling Right: Independent Supine to Sit: Independent Sit to  Supine: Independent with assistive device (uses belt as a leg lifter)    Trunk/Postural Assessment : Cervical Assessment Cervical Assessment: Within  Functional Limits Thoracic Assessment Thoracic Assessment: Within Functional Limits Lumbar Assessment Lumbar Assessment: Within Functional Limits Postural Control Postural Control: Within Functional Limits  Balance: Static Sitting Balance Static Sitting - Level of Assistance: 7: Independent Dynamic Sitting Balance Dynamic Sitting - Level of Assistance: 7: Independent Static Standing Balance Static Standing - Level of Assistance: 6: Modified independent (Device/Increase time) Dynamic Standing Balance Dynamic Standing - Level of Assistance: 6: Modified independent (Device/Increase time) Exercises:    Therapy/Group: Individual Therapy  Hilary Hertz PT, SPT   Hilary Hertz 03/11/2022, 4:17 PM

## 2022-03-11 NOTE — Patient Care Conference (Signed)
Inpatient RehabilitationTeam Conference and Plan of Care Update Date: 03/11/2022   Time: 10:21 AM    Patient Name: Franklin Anderson      Medical Record Number: [id number]  Date of Birth: [birth year] Sex: Male         Room/Bed: 4W06C/4W06C-01 Payor Info: Payor: /    Admit Date/Time:  03/05/2022  6:52 PM  Primary Diagnosis:  CVA (cerebral vascular accident) [facility])  Hospital Problems: Principal Problem:   CVA (cerebral vascular accident) [facility])    Expected Discharge Date: Expected Discharge Date: 03/13/22  Team Members Present: Physician leading conference: Dr. Claudette Laws Social Worker Present: Lavera Guise, BSW Nurse Present: Chana Bode, RN PT Present: Grier Rocher, PT OT Present: Primitivo Gauze, OT SLP Present: Eilene Ghazi, SLP PPS Coordinator present : Fae Pippin, SLP     Current Status/Progress Goal Weekly Team Focus  Bowel/Bladder   Pt is continent of bowel/bladder   Pt will remain continent of bowel/bladder   Will assess qshift and PRN    Swallow/Nutrition/ Hydration               ADL's   supervision- MOD I overall, continues to have pain in R hip>knee but very participatort and motivated. neurospych to see? reports no longer on anxiety and PTSD meds   Mod I overall   family ed, endurance, transfer training, pain mgmt    Mobility   supevrision assist with LRAD for all moblity   Mod I with RW ambulatory. stair managemetn without assist from PT  improved activity tolerance, endurance, gait training comunity mobility, balance    Communication                Safety/Cognition/ Behavioral Observations               Pain   Pt complains of pain   Pt's pain will be managed   Will assess qshift and PRN    Skin   Pt has a wound on the outside of right leg covered with foam   Pt's wound will heal  Will assess qshift and PRN      Discharge Planning:  Discharging home with spouse able to provide 24/7.   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 Chronic pain issues limiting progress, perseverates on pain and poor effort by patient exerted. Smoking cessation addressed. Dressings to pressure wound on leg.   Patient on target to meet rehab goals: yes, currently needs supervision - Mod I with a RW  *See Care Plan and progress notes for long and short-term goals.   Revisions to Treatment Plan:  Referral to [facility] post discharge   Teaching Needs: Safety, medications, skin care, transfers, etc  Current Barriers to Discharge: Decreased caregiver support, Home enviroment access/layout, and Behavior  Possible Resolutions to Barriers: Family education DME: has a Psychologist, prison and probation services Summary Current Status: Uncontrolled pain, RIght glut medius inflammation and nerve pain RIght foot  Barriers to Discharge: Uncontrolled Pain;Medical stability   Possible Resolutions to Barriers/Weekly Focus: hypertension , adjust meds , will adjust nonnarcotic pain meds   Continued Need for Acute Rehabilitation Level of Care: The patient requires daily medical management by a physician with specialized training in physical medicine and rehabilitation for the following reasons: Direction of a multidisciplinary physical rehabilitation program to maximize functional independence : Yes Medical management of patient stability for increased activity during participation  in an intensive rehabilitation regime.: Yes Analysis of laboratory values and/or radiology reports with any subsequent need for medication adjustment and/or medical intervention. : Yes   I attest that I was present, lead the team conference, and concur with the assessment and plan of the team.   Dorien Chihuahua B 03/11/2022, 4:21 PM

## 2022-03-11 NOTE — Discharge Summary (Signed)
Physician Discharge Summary  Patient ID: ELDRIC JOACHIM MRN: [id number] DOB/AGE: [birth year] 51 y.o.  Admit date: 03/05/2022 Discharge date: 03/13/2022  Discharge Diagnoses:  Principal Problem:   CVA (cerebral vascular accident) [facility]) DVT prophylaxis Pain management Severe rhabdo/AKI Elevated LFTs Severe muscle inflammation of right hip and gluteal muscle/crush injury History of polysubstance abuse Hypertension Anemia  Discharged Condition: Stable  Significant Diagnostic Studies: ECHO TEE  Result Date: 03/04/2022    TRANSESOPHOGEAL ECHO REPORT   Patient Name:   RONDIE PARTEE Date of Exam: 03/04/2022 Medical Rec #:  [id number]      Height:       72.0 in Accession #:    [id number]     Weight:       162.9 lb Date of Birth:  15-Jul-1970     BSA:          1.953 m Patient Age:    51 years       BP:           149/105 mmHg Patient Gender: M              HR:           85 bpm. Exam Location:  Inpatient Procedure: Transesophageal Echo, Color Doppler and Cardiac Doppler Indications:     Stroke i63.9  History:         Patient has prior history of Echocardiogram examinations, most                  recent 02/25/2022. Risk Factors:Hypertension and Polysubstance                  Abuse.  Sonographer:     Raquel Sarna Senior RDCS Referring Phys:  Cecilie Kicks, R Diagnosing Phys: Berniece Salines DO PROCEDURE: After discussion of the risks and benefits of a TEE, an informed consent was obtained from the patient. The transesophogeal probe was passed without difficulty through the esophogus of the patient. Sedation performed by different physician. The patient was monitored while under deep sedation. Anesthestetic sedation was provided intravenously by Anesthesiology: 142mg  of Propofol, 60mg  of Lidocaine. The patient's vital signs; including heart rate, blood pressure, and oxygen saturation; remained stable throughout the procedure. The patient developed no complications during the procedure.  IMPRESSIONS  1. Left ventricular  ejection fraction, by estimation, is 55 to 60%. The left ventricle has normal function. The left ventricle has no regional wall motion abnormalities.  2. Right ventricular systolic function is normal. The right ventricular size is normal.  3. No left atrial/left atrial appendage thrombus was detected. The LAA emptying velocity was 96 cm/s.  4. The mitral valve is normal in structure. No evidence of mitral valve regurgitation. No evidence of mitral stenosis.  5. The aortic valve is normal in structure. Aortic valve regurgitation is trivial. No aortic stenosis is present.  6. There is Moderate (Grade III) layered and protruding plaque involving the descending aorta and aortic arch.  7. The inferior vena cava is normal in size with greater than 50% respiratory variability, suggesting right atrial pressure of 3 mmHg.  8. Agitated saline contrast bubble study was negative, with no evidence of any interatrial shunt. Conclusion(s)/Recommendation(s): Normal biventricular function without evidence of hemodynamically significant valvular heart disease. FINDINGS  Left Ventricle: Left ventricular ejection fraction, by estimation, is 55 to 60%. The left ventricle has normal function. The left ventricle has no regional wall motion abnormalities. The left ventricular internal cavity size was normal in size. There is  no left  ventricular hypertrophy. Right Ventricle: The right ventricular size is normal. No increase in right ventricular wall thickness. Right ventricular systolic function is normal. Left Atrium: Left atrial size was normal in size. No left atrial/left atrial appendage thrombus was detected. The LAA emptying velocity was 96 cm/s. Right Atrium: Right atrial size was normal in size. Pericardium: There is no evidence of pericardial effusion. Mitral Valve: The mitral valve is normal in structure. No evidence of mitral valve regurgitation. No evidence of mitral valve stenosis. Tricuspid Valve: The tricuspid valve is  normal in structure. Tricuspid valve regurgitation is trivial. No evidence of tricuspid stenosis. Aortic Valve: The aortic valve is normal in structure. Aortic valve regurgitation is trivial. No aortic stenosis is present. Pulmonic Valve: The pulmonic valve was normal in structure. Pulmonic valve regurgitation is not visualized. No evidence of pulmonic stenosis. Aorta: The aortic root is normal in size and structure. There is moderate (Grade III) layered and protruding plaque involving the descending aorta and aortic arch. Venous: The left upper pulmonary vein, left lower pulmonary vein, right upper pulmonary vein and right lower pulmonary vein are normal. A normal flow pattern is recorded from the left upper pulmonary vein. The inferior vena cava is normal in size with greater than 50% respiratory variability, suggesting right atrial pressure of 3 mmHg. IAS/Shunts: No atrial level shunt detected by color flow Doppler. Agitated saline contrast bubble study was negative, with no evidence of any interatrial shunt. Additional Comments: Spectral Doppler performed. Berniece Salines DO Electronically signed by Berniece Salines DO Signature Date/Time: 03/04/2022/2:09:33 PM    Final    VAS US CAROTID  Result Date: 02/26/2022 Carotid Arterial Duplex Study Patient Name:  JERAN LEPAGE  Date of Exam:   02/25/2022 Medical Rec #: [id number]       Accession #:    [id number] Date of Birth: 1970-08-21      Patient Gender: M Patient Age:   51 years Exam Location:  [facility] Procedure:      VAS US CAROTID Referring Phys: Cornelius Moras XU --------------------------------------------------------------------------------  Indications:       CVA. Risk Factors:      Hypertension. Comparison Study:  no prior Performing Technologist: Archie Patten RVS  Examination Guidelines: A complete evaluation includes B-mode imaging, spectral Doppler, color Doppler, and power Doppler as needed of all accessible portions of each vessel. Bilateral testing  is considered an integral part of a complete examination. Limited examinations for reoccurring indications may be performed as noted.  Right Carotid Findings: +----------+--------+--------+--------+------------------+--------+           PSV cm/sEDV cm/sStenosisPlaque DescriptionComments +----------+--------+--------+--------+------------------+--------+ CCA Prox  90      29              heterogenous               +----------+--------+--------+--------+------------------+--------+ CCA Distal57      24              heterogenous               +----------+--------+--------+--------+------------------+--------+ ICA Prox  53      21              heterogenous               +----------+--------+--------+--------+------------------+--------+ ICA Distal71      30                                         +----------+--------+--------+--------+------------------+--------+  ECA       81      27                                         +----------+--------+--------+--------+------------------+--------+ +----------+--------+-------+--------+-------------------+           PSV cm/sEDV cmsDescribeArm Pressure (mmHG) +----------+--------+-------+--------+-------------------+ Subclavian89                                         +----------+--------+-------+--------+-------------------+ +---------+--------+--+--------+--+---------+ VertebralPSV cm/s41EDV cm/s18Antegrade +---------+--------+--+--------+--+---------+  Left Carotid Findings: +----------+--------+--------+--------+------------------+--------+           PSV cm/sEDV cm/sStenosisPlaque DescriptionComments +----------+--------+--------+--------+------------------+--------+ CCA Prox  93      29              heterogenous               +----------+--------+--------+--------+------------------+--------+ CCA Distal73      27              heterogenous                +----------+--------+--------+--------+------------------+--------+ ICA Prox  64      35              heterogenous               +----------+--------+--------+--------+------------------+--------+ ICA Distal83      40                                         +----------+--------+--------+--------+------------------+--------+ ECA       91      23                                         +----------+--------+--------+--------+------------------+--------+ +----------+--------+--------+--------+-------------------+           PSV cm/sEDV cm/sDescribeArm Pressure (mmHG) +----------+--------+--------+--------+-------------------+ [id number]                                          +----------+--------+--------+--------+-------------------+ +---------+--------+--+--------+--+---------+ VertebralPSV cm/s35EDV cm/s10Antegrade +---------+--------+--+--------+--+---------+   Summary: Right Carotid: The extracranial vessels were near-normal with only minimal wall                thickening or plaque. Left Carotid: The extracranial vessels were near-normal with only minimal wall               thickening or plaque. Vertebrals: Bilateral vertebral arteries demonstrate antegrade flow. *See table(s) above for measurements and observations.  Electronically signed by Antony Contras MD on 02/26/2022 at 8:04:03 AM.    Final    ECHOCARDIOGRAM COMPLETE  Result Date: 02/25/2022    ECHOCARDIOGRAM REPORT   Patient Name:   HOWIE BETTON Hetland Date of Exam: 02/25/2022 Medical Rec #:  [id number]      Height:       72.0 in Accession #:    [id number]     Weight:       125.0 lb Date of Birth:  06-Mar-1971     BSA:          1.745 m  Patient Age:    56 years       BP:           174/116 mmHg Patient Gender: M              HR:           83 bpm. Exam Location:  Inpatient Procedure: 2D Echo, Color Doppler and Cardiac Doppler Indications:    Stroke  History:        Patient has no prior history of Echocardiogram  examinations.                 Signs/Symptoms:Murmur; Risk Factors:Hypertension, Family History                 of Coronary Artery Disease and Current Smoker. Polysubstance                 abuse, rheumatic fever (as a child).  Sonographer:    Eartha Inch Referring Phys: [id number]  Sonographer Comments: Image acquisition challenging due to patient body habitus and Image acquisition challenging due to respiratory motion. IMPRESSIONS  1. Left ventricular ejection fraction, by estimation, is 55%. The left ventricle has normal function. The left ventricle has no regional wall motion abnormalities. There is mild concentric left ventricular hypertrophy. Left ventricular diastolic parameters are consistent with Grade I diastolic dysfunction (impaired relaxation).  2. Right ventricular systolic function is normal. The right ventricular size is normal. Tricuspid regurgitation signal is inadequate for assessing PA pressure.  3. The mitral valve is normal in structure. Trivial mitral valve regurgitation. No evidence of mitral stenosis.  4. The aortic valve is tricuspid. Aortic valve regurgitation is trivial. No aortic stenosis is present.  5. The inferior vena cava is normal in size with greater than 50% respiratory variability, suggesting right atrial pressure of 3 mmHg. FINDINGS  Left Ventricle: Left ventricular ejection fraction, by estimation, is 55%. The left ventricle has normal function. The left ventricle has no regional wall motion abnormalities. The left ventricular internal cavity size was normal in size. There is mild concentric left ventricular hypertrophy. Left ventricular diastolic parameters are consistent with Grade I diastolic dysfunction (impaired relaxation). Right Ventricle: The right ventricular size is normal. No increase in right ventricular wall thickness. Right ventricular systolic function is normal. Tricuspid regurgitation signal is inadequate for assessing PA pressure. Left Atrium: Left  atrial size was normal in size. Right Atrium: Right atrial size was normal in size. Pericardium: Trivial pericardial effusion is present. Mitral Valve: The mitral valve is normal in structure. Trivial mitral valve regurgitation. No evidence of mitral valve stenosis. MV peak gradient, 4.0 mmHg. The mean mitral valve gradient is 2.0 mmHg. Tricuspid Valve: The tricuspid valve is normal in structure. Tricuspid valve regurgitation is not demonstrated. Aortic Valve: The aortic valve is tricuspid. Aortic valve regurgitation is trivial. No aortic stenosis is present. Pulmonic Valve: The pulmonic valve was normal in structure. Pulmonic valve regurgitation is not visualized. Aorta: The aortic root is normal in size and structure. Venous: The inferior vena cava is normal in size with greater than 50% respiratory variability, suggesting right atrial pressure of 3 mmHg. IAS/Shunts: No atrial level shunt detected by color flow Doppler.  LEFT VENTRICLE PLAX 2D LVIDd:         4.50 cm      Diastology LVIDs:         4.00 cm      LV e' medial:    8.55 cm/s LV PW:  1.20 cm      LV E/e' medial:  7.8 LV IVS:        0.90 cm      LV e' lateral:   8.86 cm/s LVOT diam:     2.10 cm      LV E/e' lateral: 7.5 LV SV:         67 LV SV Index:   38 LVOT Area:     3.46 cm  LV Volumes (MOD) LV vol d, MOD A2C: 93.4 ml LV vol d, MOD A4C: 149.0 ml LV vol s, MOD A2C: 52.3 ml LV vol s, MOD A4C: 84.5 ml LV SV MOD A2C:     41.1 ml LV SV MOD A4C:     149.0 ml LV SV MOD BP:      48.2 ml RIGHT VENTRICLE             IVC RV S prime:     15.50 cm/s  IVC diam: 2.00 cm TAPSE (M-mode): 1.8 cm LEFT ATRIUM             Index        RIGHT ATRIUM           Index LA diam:        2.90 cm 1.66 cm/m   RA Area:     13.90 cm LA Vol (A2C):   63.8 ml 36.57 ml/m  RA Volume:   34.50 ml  19.77 ml/m LA Vol (A4C):   28.5 ml 16.33 ml/m LA Biplane Vol: 43.1 ml 24.70 ml/m  AORTIC VALVE LVOT Vmax:   132.00 cm/s LVOT Vmean:  87.200 cm/s LVOT VTI:    0.193 m  AORTA Ao Root  diam: 2.90 cm Ao Asc diam:  3.20 cm MITRAL VALVE MV Area (PHT): 3.06 cm    SHUNTS MV Area VTI:   2.88 cm    Systemic VTI:  0.19 m MV Peak grad:  4.0 mmHg    Systemic Diam: 2.10 cm MV Mean grad:  2.0 mmHg MV Vmax:       1.00 m/s MV Vmean:      64.2 cm/s MV Decel Time: 248 msec MV E velocity: 66.70 cm/s MV A velocity: 68.90 cm/s MV E/A ratio:  0.97 Dalton McleanMD Electronically signed by Franki Monte Signature Date/Time: 02/25/2022/3:04:29 PM    Final    VAS [place] LOWER EXTREMITY VENOUS (DVT)  Result Date: 02/25/2022  Lower Venous DVT Study Patient Name:  GEOVANI ISA  Date of Exam:   02/25/2022 Medical Rec #: [id number]       Accession #:    [id number] Date of Birth: 15-Feb-1971      Patient Gender: M Patient Age:   13 years Exam Location:  [facility] Procedure:      VAS [place] LOWER EXTREMITY VENOUS (DVT) Referring Phys: Cornelius Moras XU --------------------------------------------------------------------------------  Indications: Stroke.  Comparison Study: prior 03/01/19 Performing Technologist: Archie Patten RVS  Examination Guidelines: A complete evaluation includes B-mode imaging, spectral Doppler, color Doppler, and power Doppler as needed of all accessible portions of each vessel. Bilateral testing is considered an integral part of a complete examination. Limited examinations for reoccurring indications may be performed as noted. The reflux portion of the exam is performed with the patient in reverse Trendelenburg.  +--------+---------------+---------+-----------+----------+--------------------+ RIGHT   CompressibilityPhasicitySpontaneityPropertiesThrombus Aging       +--------+---------------+---------+-----------+----------+--------------------+ CFV     Full           Yes      Yes                                       +--------+---------------+---------+-----------+----------+--------------------+  SFJ     Full                                                               +--------+---------------+---------+-----------+----------+--------------------+ FV Prox Full                                                              +--------+---------------+---------+-----------+----------+--------------------+ FV Mid  Full           Yes      Yes                                       +--------+---------------+---------+-----------+----------+--------------------+ FV      Full                                                              Distal                                                                    +--------+---------------+---------+-----------+----------+--------------------+ PFV     Full                                                              +--------+---------------+---------+-----------+----------+--------------------+ POP     Full           Yes      Yes                                       +--------+---------------+---------+-----------+----------+--------------------+ PTV     Full           Yes      Yes                  patent by color                                                           doppler              +--------+---------------+---------+-----------+----------+--------------------+ PERO    Full           Yes      Yes  patent by color                                                           doppler              +--------+---------------+---------+-----------+----------+--------------------+   +---------+---------------+---------+-----------+----------+--------------+ LEFT     CompressibilityPhasicitySpontaneityPropertiesThrombus Aging +---------+---------------+---------+-----------+----------+--------------+ CFV      Full           Yes      Yes                                 +---------+---------------+---------+-----------+----------+--------------+ SFJ      Full                                                         +---------+---------------+---------+-----------+----------+--------------+ FV Prox  Full                                                        +---------+---------------+---------+-----------+----------+--------------+ FV Mid   Full                                                        +---------+---------------+---------+-----------+----------+--------------+ FV DistalFull                                                        +---------+---------------+---------+-----------+----------+--------------+ PFV      Full                                                        +---------+---------------+---------+-----------+----------+--------------+ POP      Full           Yes      Yes                                 +---------+---------------+---------+-----------+----------+--------------+ PTV      Full                                                        +---------+---------------+---------+-----------+----------+--------------+ PERO     Full                                                        +---------+---------------+---------+-----------+----------+--------------+  Summary: BILATERAL: - No evidence of deep vein thrombosis seen in the lower extremities, bilaterally. -No evidence of popliteal cyst, bilaterally.   *See table(s) above for measurements and observations. Electronically signed by Monica Martinez MD on 02/25/2022 at 12:07:47 PM.    Final    MR ANGIO HEAD WO CONTRAST  Result Date: 02/25/2022 CLINICAL DATA:  Stroke, follow up EXAM: MRA HEAD WITHOUT CONTRAST TECHNIQUE: Angiographic images of the Circle of Willis were acquired using MRA technique without intravenous contrast. COMPARISON:  MRI head February 24, 2022. FINDINGS: Anterior circulation: Bilateral intracranial ICAs, MCAs, and ACAs are patent without proximal hemodynamically significant stenosis. Posterior circulation: Bilateral intradural vertebral arteries, basilar artery and bilateral  posterior cerebral arteries are patent without proximal hemodynamically significant stenosis. Left fetal type PCA, anatomic variant. IMPRESSION: No emergent large vessel occlusion or proximal hemodynamically significant stenosis. Electronically Signed   By: Margaretha Sheffield M.D.   On: 02/25/2022 09:01   MR BRAIN WO CONTRAST  Result Date: 02/24/2022 CLINICAL DATA:  Altered mental status.  Found down. EXAM: MRI HEAD WITHOUT CONTRAST TECHNIQUE: Multiplanar, multiecho pulse sequences of the brain and surrounding structures were obtained without intravenous contrast. 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 No evidence of hemorrhage. No extra-axial fluid collection. No hydrocephalus. 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 Vascular: Normal flow voids. Skull and upper cervical spine: Normal marrow signal. Sinuses/Orbits: Negative. Other: There is a soft tissue hematoma along the midline frontal scalp. IMPRESSION: 1. 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 No hemorrhage or mass effect. 2. Soft tissue hematoma along the midline frontal scalp. Electronically Signed   By: Marin Roberts M.D.   On: 02/24/2022 10:10   CT EXTREMITY LOWER RIGHT WO CONTRAST  Addendum Date: 02/24/2022   ADDENDUM REPORT: 02/24/2022 03:59 ADDENDUM: Discussed over the phone in detail with Dr. Marcello Moores at 3:51 a.m., 02/24/2022, with verbal acknowledgement of the key findings. Electronically Signed   By: Telford Nab M.D.   On: 02/24/2022 03:59   Result Date: 02/24/2022 CLINICAL DATA:  Severe rhabdomyolysis. Evaluate for compartment syndrome. EXAM: CT OF THE LOWER RIGHT EXTREMITY WITHOUT CONTRAST TECHNIQUE: Multidetector CT imaging of the entire right lower extremity was performed according to the standard  protocol. RADIATION DOSE REDUCTION: This exam was performed according to the departmental dose-optimization program which includes automated exposure control, adjustment of the mA and/or kV according to patient size and/or use of iterative reconstruction technique. COMPARISON:  Limited comparison is available with a right hip CT from 03/31/2019. FINDINGS: Bones/Joint/Cartilage Normal bone mineralization. There is no evidence of fractures or aggressive bone lesions and no findings of right femoral head avascular necrosis. 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 No significant ankle or foot arthropathy is seen. No evidence of joint effusions or hemarthrosis. There is a small accessory navicular bone incidentally noted as well as a tiny os peroneum alongside the cuboid bone. Ligaments Suboptimally assessed by CT. Muscles and Tendons There is diffuse edema and swelling of the right gluteus medius muscle, the dorsal hip adductors in the proximal thigh and the distal aspect of the vastus medialis. The foreleg and plantar foot musculature are unremarkable without contrast. Area tendons are grossly intact, as visualized. Soft tissues No mass, soft tissue  gas or localizing fluid collection is seen. There is scattered nonlocalizing fluid in the dorsal proximal thigh between the muscle groups. There is mild superficial edema in the thigh. IMPRESSION: 1. Diffuse edema and swelling of the right gluteus medius muscle, the dorsal hip adductors in the proximal thigh, and the distal aspect of the vastus medialis. 2. 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 3. No evidence of fractures or aggressive bone lesions. 4. Mild nonerosive degenerative changes of the right hip and lateral patellofemoral joint. Electronically Signed: By: Almira Bar M.D. On:  02/24/2022 02:26   CT Extrem Up Entire Arm R WO/CM  Addendum Date: 02/24/2022   ADDENDUM REPORT: 02/24/2022 02:27 ADDENDUM: Not mentioned above, there are coarse linear scar-like opacities in the right lung apex and right lower lung field. There is a 4 mm chronic nodule in right lower lung field which was seen on a 2008 abdomen pelvis CT and is unchanged. Electronically Signed   By: Almira Bar M.D.   On: 02/24/2022 02:27   Result Date: 02/24/2022 CLINICAL DATA:  Severe rhabdomyolysis. Evaluate for compartment syndrome findings. EXAM: CT OF THE UPPER RIGHT EXTREMITY WITHOUT CONTRAST TECHNIQUE: Multidetector CT imaging of the upper right extremity was performed according to the standard protocol. RADIATION DOSE REDUCTION: This exam was performed according to the departmental dose-optimization program which includes automated exposure control, adjustment of the mA and/or kV according to patient size and/or use of iterative reconstruction technique. COMPARISON:  None Available. FINDINGS: Bones/Joint/Cartilage There is normal bone mineralization without acute fractures. There is a cortical depression in the posterosuperior right humeral head suggesting an age-indeterminate Hill-Sachs impaction deformity such as due to an old anterior shoulder dislocation. 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 There is no evidence of joint effusions or hemarthrosis. No destructive bone lesion or aggressive periostitis is seen in the right upper extremity. Ligaments Suboptimally assessed by CT. Muscles and Tendons There are considerable beam hardening artifacts through the upper extremity due to the study having been performed with his arm by his side. This limits fine detail regarding the muscles and tendons. There is a grossly normal muscle bulk for the patient's age. Whether or not there are tendon abnormalities is indeterminate. Soft tissues No soft tissue  gas or fluid collections are identified. IMPRESSION: 1. No soft tissue gas or focal fluid collections are identified. Absence of these findings however, should not delay surgical exploration if there is strong clinical concern for compartment syndrome with necrotizing fasciitis. 2. Cortical depression in the posterosuperior right humeral head suggesting an age-indeterminate Hill-Sachs impaction deformity such as due to an old anterior shoulder dislocation. 3. 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 No evidence of joint effusions or hemarthrosis. 4. No destructive bone lesion or aggressive periostitis in the right upper extremity. 5. Significant beam hardening artifacts through the upper extremity due to technique. This limits fine detail regarding the muscles and tendons. Electronically Signed: By: Almira Bar M.D. On: 02/24/2022 02:03   DG Pelvis 1-2 Views  Result Date: 02/23/2022 CLINICAL DATA:  Fall. EXAM: PELVIS - 1-2 VIEW COMPARISON:  None Available. FINDINGS: There is no evidence of pelvic fracture or diastasis. No pelvic bone lesions are seen. IMPRESSION: Negative. Electronically Signed   By: Darliss Cheney M.D.   On: 02/23/2022 21:41   DG Chest Portable 1 View  Result Date: 02/23/2022 CLINICAL DATA:  Fall EXAM: PORTABLE CHEST 1 VIEW COMPARISON:  02/06/2022 FINDINGS: Heart and mediastinal contours are within normal limits. No focal opacities or effusions. No acute bony abnormality. IMPRESSION: No active disease. Electronically Signed   By: Rolm Baptise M.D.   On: 02/23/2022 21:11   CT HEAD CODE STROKE WO CONTRAST  Result Date: 02/23/2022 CLINICAL DATA:  Code stroke. Initial evaluation for stroke, right-sided deficits. EXAM: CT HEAD WITHOUT CONTRAST TECHNIQUE: Contiguous axial images were obtained from the base of the skull through the vertex without intravenous contrast. RADIATION DOSE REDUCTION: This exam was performed according to the  departmental dose-optimization program which includes automated exposure control, adjustment of the mA and/or kV according to patient size and/or use of iterative reconstruction technique. COMPARISON:  Prior CT from 02/22/2019. FINDINGS: Brain: Cerebral volume within normal limits for age. 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 No acute large vessel territory infarct. No acute intracranial hemorrhage. No mass lesion or midline shift. No hydrocephalus or extra-axial fluid collection. Vascular: No asymmetric hyperdense vessel. Skull: Focal soft tissue thickening/swelling present at the central forehead, indeterminate (series 5, image 22). Calvarium intact. Sinuses/Orbits: Globes orbital soft tissues within normal limits. Paranasal sinuses and mastoid air cells are largely clear. Other: None. ASPECTS [facility] Stroke Program Early CT Score) - Ganglionic level infarction (caudate, lentiform nuclei, internal capsule, insula, M1-M3 cortex): 7 - Supraganglionic infarction (M4-M6 cortex): 3 Total score (0-10 with 10 being normal): 10 IMPRESSION: 1. No acute intracranial abnormality. 2. ASPECTS is 10. 3. 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 4. Focal soft tissue thickening and/or swelling at the central forehead. Correlation with physical exam recommended. These results were communicated to Dr. Rory Percy at 8:42 pm on 02/23/2022 by text page via the [facility] messaging system. Electronically Signed   By: Jeannine Boga M.D.   On: 02/23/2022 20:45    Labs:  Basic Metabolic Panel: Recent Labs  Lab 03/06/22 0603 03/09/22 0451 03/12/22 0528  NA 141 144 139  K 4.4 4.0 4.1  CL 107 108 103  CO2 22 27 25   GLUCOSE 114* 109* 113*  BUN 59* 37* 33*  CREATININE 2.30* 1.56* 1.20  CALCIUM 8.9 9.2 9.6    CBC: Recent Labs  Lab 03/06/22 0603 03/09/22 0451  WBC 9.4 6.8  NEUTROABS 6.6  --   HGB  12.2* 10.3*  HCT 35.4* 30.3*  MCV 95.7 96.2  PLT 309 400    CBG: No results for input(s): "GLUCAP" in the last 168 hours.  Family history.  Father with CAD mother with COPD and lymphoma.  Denies any colon cancer esophageal cancer or rectal cancer  Brief HPI:   EAGAN BAERGA is a 51 y.o. 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  Independent prior to admission.  Presented 02/23/2022 after being found down in his home covered in feces after apparent fall from the bed.  Cranial CT scan showed no acute intracranial abnormality.  Small remote right cerebellar infarct chronic in appearance but new as compared to prior CT of 2020.  Focal soft tissue thickening and/or swelling at the central forehead.  Pelvic films negative.  MRI of the brain showed acute infarction left cerebellum and corona radiata on the right.  No hemorrhage or mass effect.  Soft tissue hematoma along the midline frontal scalp.  CT of right lower extremity showed diffuse edema and swelling of the right gluteus medius muscle, the dorsal hip abductors and the proximal thigh, and the distal aspect of the vastus medialis.  No evidence of fracture or bone lesion.  CT right upper extremity showed no soft tissue gas or fluid collection.  Admission chemistries unremarkable except WBC 16,400 potassium 7.1 BUN 56 creatinine 3.33 peaking at 8.48, CK 43,333 urine drug screen positive cocaine benzos as well as amphetamines.  Echocardiogram with ejection fraction of 55% no wall motion abnormalities.  Grade 1 diastolic dysfunction.  TEE completed showing no PFO or clot.  Follow-up renal services for severe rhabdo AKI with latest creatinine 2.76 and CK improved to 2338 and currently no plan for hemodialysis.  Patient with elevated LFTs most likely due to rhabdo and monitored.  Wound care nurse consulted for right leg wound felt to be due to being down for an extended amount of time  with wound care as directed.  Follow-up orthopedic service Dr. Victorino December in regards to right lower extremity compression myonecrosis no weightbearing restrictions.  No orthopedic interventions recommended at this time.  Patient was cleared to begin heparin for DVT prophylaxis.  In regards to patient's right corona radiata left cerebellar CVA recommendations for aspirin and Plavix x21 days then aspirin alone.  Therapy evaluations completed due to patient decreased functional mobility was admitted for a comprehensive rehab program.   Hospital Course: OMKAR EBEL was admitted to rehab 03/05/2022 for inpatient therapies to consist of PT, ST and OT at least three hours five days a week. Past admission physiatrist, therapy team and rehab RN have worked together to provide customized collaborative inpatient rehab.  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  Subcutaneous heparin for DVT prophylaxis no bleeding episodes.  Pain managed with use of oxycodone as needed as well as initiation of gabapentin and Lidoderm patch.  Added nortriptyline at nighttime.  Severe rhabdo/AKI follow-up renal services initially on gentle IV fluids with latest CK4 5 9, creatinine 1.56.  LFTs improved HCV-AB+ but had been in the past PCR negative ID to schedule follow-up outpatient 1 month.  Severe muscle inflammation of right hip and gluteus muscle/crush injury follow-up orthopedic services no surgical intervention weightbearing as tolerated.  History of polysubstance tobacco abuse providing counseling regarding cessation of nicotine and illicit drug use and patient was referred to [facility] [phone] for follow-up.  Blood pressure controlled on present regimen stressed the need to follow-up outpatient.   Blood pressures were monitored on TID basis and  controlled     Rehab course: During patient's stay in rehab weekly team conferences were held to monitor patient's progress, set goals and discuss barriers to discharge. At admission, patient required minimal assist 30 feet rolling walker minimal assist step pivot transfers  Physical exam.  Blood pressure 155/90 pulse 76 temperature 91 respirations 18 oxygen saturation 95% room air Constitutional.  No acute distress HEENT Head.  Normocephalic and atraumatic Eyes.  Pupils round and reactive to light no discharge without nystagmus Neck.  Supple nontender no JVD without thyromegaly Cardiac regular rate and rhythm without any extra sounds or murmur heard Abdomen.  Soft nontender positive bowel sounds without rebound Respiratory effort normal no respiratory distress without wheeze Skin.  Healing scab right lateral calf covered with foam dressing Musculoskeletal.  Strength right upper extremity 5/5 SA 5/5 DF  5/5 EE, W EE, FF and 5/5 FA Left upper extremity 5/5 Right lower extremity 2+/5 HF 2+/5 KE 3+/5 DF/5 EHL, 4/5 PF limited by pain Left lower extremity 5/5 Neurologic.  Alert no dysarthria names 3 out of 3 objects memory recall 3 out of 5  He/She  has had improvement in activity tolerance, balance, postural control as well as ability to compensate for deficits. He/She has had improvement in functional use RUE/LUE  and RLE/LLE as well as improvement in awareness.  Transfers supine to sit supervision.  Transfers sit to stand supervision.  Ambulates multiple trials rolling walker and supervision.  Negotiated ramp surface with supervision.  Ambulates to the bathroom rolling walker contact-guard.  Gather his belongings for activities day living and homemaking.  Full family teaching completed plan discharge to home       Disposition: Discharge to home    Diet: Regular  Special Instructions: No driving smoking or alcohol  Apply Medihoney to right leg wound daily then cover with foam  dressing.  Change foam dressing every 3 days or as needed soiling.  Follow-up [facility] [phone] between the hours of 5 AM and 9 AM Monday, 03/16/2022  Medications at discharge 1.  Norvasc 10 mg p.o. daily 2.  Aspirin 81 mg p.o. daily 3.  Clonidine 0.2 mg p.o. twice daily 4.  Plavix 75 mg p.o. daily until 03/26/2022 and stop 5.  Voltaren gel 4 g 4 times daily to affected area 6.  Neurontin 300 mg p.o. 3 times daily 7.  Lidoderm patch change as directed 8.  Lopressor 100 mg p.o. twice daily 9.  NicoDerm patch taper as directed 10.  Pamelor 25 mg p.o. nightly 11.  Oxycodone 5 to 10 mg every 4 hours as needed pain  30-35 minutes were spent completing discharge summary and discharge planning   Discharge Instructions     Ambulatory referral to Neurology   Complete by: As directed    An appointment is requested in approximately: 4 weeks left cerebellum corona radiata infarction        Follow-up Information     Kirsteins, Luanna Salk, MD Follow up.   Specialty: Physical Medicine and Rehabilitation Why: Office to call for appointment Contact information: [place] [address] [phone]         Nicholes Stairs, MD Follow up.   Specialty: Orthopedic Surgery Why: Call for appointment as needed Contact information: [address] [place] [address] [phone]         Madelon Lips, MD Follow up.   Specialty: Nephrology Why: Call for appointment Contact information: [place] [address] [phone]         Antony Blackbird, MD Follow up.   Specialty: Family Medicine Contact information: [place] [address] [phone]                 Signed: Cathlyn Parsons 03/13/2022, 5:05 AM

## 2022-03-11 NOTE — Progress Notes (Addendum)
Patient ID: Franklin Anderson, male   DOB: 02/17/1971, 51 y.o.   MRN: [id number]  Team Conference Report to Patient/Family  Team Conference discussion was reviewed with the patient and caregiver, including goals, any changes in plan of care and target discharge date.  Patient and caregiver express understanding and are in agreement.  The patient has a target discharge date of 03/13/22.  Sw met with patient and provided team conference updates. Patient reports 9/10 pain levels. Patient reports that he will discharge to his spouses home. Patient shares that he will not discharge to any location but his spouses home.Brothers are not local and currently work.  Patient home currently does not have running water/electricity but will be available at the spouses home.Patient prefers to discharge home with spouse. SW provided spouse with conference updates and spouse agreeable to assisting patient at home. Spouse will be present today around 3:30 PM. No additional questions or concerns.  Dyanne Iha 03/11/2022, 1:57 PM

## 2022-03-11 NOTE — Progress Notes (Signed)
Occupational Therapy Session Note  Patient Details  Name: Franklin Anderson MRN: [id number] Date of Birth: 07-22-1970  Today's Date: 03/11/2022 OT Individual Time: 1310-1418 OT Individual Time Calculation (min): 68 min  7 mins missed as pt eating lunch   Short Term Goals: Week 1:  OT Short Term Goal 1 (Week 1): Pt will tolerate standing 3 minutes during functional activities OT Short Term Goal 2 (Week 1): Pt will demonstrate learning of 2 pain management techniques with min A OT Short Term Goal 3 (Week 1): Pt will complete U/LB bathing with CGA OT Short Term Goal 4 (Week 1): Pt will complete LB dressing with min A  Skilled Therapeutic Interventions/Progress Updates:   Pt greeted supine in bed, pt reports not receiving lunch tray yet. Nurse called to inquire about tray with report that it was on the way. Pt agreeable to engage in session until tray arrived. Retrieved BUE HEP print out for home however when OTA returned, food tray arrived. Allowed pt 7  mins to eat and returned 7 mins later with pt agreeable to OT session. Pt completed ambulatory toilet transfer with Rw MODI and able to complete all toileting tasks MOD I as well as standing at sink to complete grooming tasks.  Pt completed below BUE therex with level 4 theraband: X10 shoulder flexion  X10 bicep curls X10 shoulder horizontal ABD X10 shoulder diagonal pulls X10 shoulder extension  X10 alternating punches X10 bilateral shoulder external rotation   Issued pt written HEP to increase carryover, pt even able to teach back therex.   Remainder of session focus on challenging dynamic balance with pt able to stand on balance board at hi low table to engage in peg board task,pt needed unilateral support on table to keep board in middle with close CGA, no LOB noted.   Pt also completed 3 steps with bilateral hand rails in prep for DC to wifes house, pt completed stairs with supervision, no LOB.   Pt completed functional ambulation  back to room with Rw MODI. Pt left supine in bed, all needs within reach.     Therapy Documentation Precautions:  Precautions Precautions: Fall Precaution Comments: scrotal edema, severe right hip pain with all mobility. Restrictions Weight Bearing Restrictions: No  Pain: unrated pain reported in RLE, rest breaks provided as needed and session graded down to pts pain tolerance.    Therapy/Group: Individual Therapy  Barron Schmid 03/11/2022, 3:40 PM

## 2022-03-11 NOTE — Progress Notes (Signed)
Physical Therapy Session Note  Patient Details  Name: Franklin Anderson MRN: [id number] Date of Birth: 12-31-1970  Today's Date: 03/11/2022 PT Individual Time: 1115-1200 45 min     Skilled Therapeutic Interventions/Progress Updates:   Pt received sitting in [facility] and agreeable to PT  Gait training in hall with RW 2 x 129f with distant supervision assist and noted foot drop. Trialed Foot up brace with improved DF and foot clearance from foot drop on the RLE 2 x 1097fand 15022fDynamic balance training on wedge wedge to perform visual scanning sequence and visual pursuit sequencing in rotation feach performed x 2 min with intermittent UE support and supervision assist from PT. Pt reports  mild increase pain on wedge, but able to perform all task asked by PT. Patient returned to room and left sitting in [facility] reach and all needs met.         Therapy Documentation Precautions:  Precautions Precautions: Fall Precaution Comments: scrotal edema, severe right hip pain with all mobility. Restrictions Weight Bearing Restrictions: No  Vital Signs: Therapy Vitals Pulse Rate: 88 BP: (!) 161/98 Pain: Pain Assessment Pain Scale: 0-10 Pain Score: 9  Pain Type: Acute pain Pain Location: Hip Pain Orientation: Right Pain Radiating Towards: knee Pain Descriptors / Indicators: Aching Pain Frequency: Constant Pain Onset: On-going Pain Intervention(s): Medication (See eMAR)    Therapy/Group: Individual Therapy  AusLorie Phenix/15/2023, 11:14 AM

## 2022-03-12 ENCOUNTER — Other Ambulatory Visit (HOSPITAL_COMMUNITY): Payer: Self-pay

## 2022-03-12 LAB — COMPREHENSIVE METABOLIC PANEL
ALT: 42 U/L (ref 0–44)
AST: 22 U/L (ref 15–41)
Albumin: 3.2 g/dL — ABNORMAL LOW (ref 3.5–5.0)
Alkaline Phosphatase: 57 U/L (ref 38–126)
Anion gap: 11 (ref 5–15)
BUN: 33 mg/dL — ABNORMAL HIGH (ref 6–20)
CO2: 25 mmol/L (ref 22–32)
Calcium: 9.6 mg/dL (ref 8.9–10.3)
Chloride: 103 mmol/L (ref 98–111)
Creatinine, Ser: 1.2 mg/dL (ref 0.61–1.24)
GFR, Estimated: >60 mL/min (ref >60)
Glucose, Bld: 113 mg/dL — ABNORMAL HIGH (ref 70–99)
Potassium: 4.1 mmol/L (ref 3.5–5.1)
Sodium: 139 mmol/L (ref 135–145)
Total Bilirubin: 0.3 mg/dL (ref 0.3–1.2)
Total Protein: 6.4 g/dL — ABNORMAL LOW (ref 6.5–8.1)

## 2022-03-12 MED ORDER — GABAPENTIN 300 MG PO CAPS
300.0000 mg | ORAL_CAPSULE | Freq: Three times a day (TID) | ORAL | 0 refills | Status: DC
  Filled 2022-03-12: qty 90, 30d supply, fill #0

## 2022-03-12 MED ORDER — METOPROLOL TARTRATE 100 MG PO TABS
100.0000 mg | ORAL_TABLET | Freq: Two times a day (BID) | ORAL | 0 refills | Status: DC
  Filled 2022-03-12: qty 60, 30d supply, fill #0

## 2022-03-12 MED ORDER — NORTRIPTYLINE HCL 25 MG PO CAPS
25.0000 mg | ORAL_CAPSULE | Freq: Every day | ORAL | 0 refills | Status: DC
  Filled 2022-03-12: qty 30, 30d supply, fill #0

## 2022-03-12 MED ORDER — OXYCODONE HCL 5 MG PO TABS
5.0000 mg | ORAL_TABLET | ORAL | 0 refills | Status: DC | PRN
  Filled 2022-03-12: qty 20, 2d supply, fill #0

## 2022-03-12 MED ORDER — CLONIDINE HCL 0.2 MG PO TABS
0.2000 mg | ORAL_TABLET | Freq: Two times a day (BID) | ORAL | 0 refills | Status: DC
  Filled 2022-03-12: qty 60, 30d supply, fill #0

## 2022-03-12 MED ORDER — CLOPIDOGREL BISULFATE 75 MG PO TABS
75.0000 mg | ORAL_TABLET | Freq: Every day | ORAL | 0 refills | Status: DC
  Filled 2022-03-12: qty 14, 14d supply, fill #0

## 2022-03-12 MED ORDER — NICOTINE 14 MG/24HR TD PT24
MEDICATED_PATCH | TRANSDERMAL | 0 refills | Status: DC
  Filled 2022-03-12: qty 28, fill #0

## 2022-03-12 MED ORDER — MEDIHONEY WOUND/BURN DRESSING EX PSTE
1.0000 | PASTE | Freq: Every day | CUTANEOUS | 0 refills | Status: DC
  Filled 2022-03-12: qty 44, 44d supply, fill #0

## 2022-03-12 MED ORDER — DICLOFENAC SODIUM 1 % EX GEL
4.0000 g | Freq: Four times a day (QID) | CUTANEOUS | 0 refills | Status: DC
  Filled 2022-03-12: qty 100, 7d supply, fill #0

## 2022-03-12 MED ORDER — LIDOCAINE 5 % EX PTCH
1.0000 | MEDICATED_PATCH | CUTANEOUS | 0 refills | Status: DC
  Filled 2022-03-12 (×2): qty 30, 30d supply, fill #0

## 2022-03-12 MED ORDER — AMLODIPINE BESYLATE 10 MG PO TABS
10.0000 mg | ORAL_TABLET | Freq: Every day | ORAL | 0 refills | Status: DC
  Filled 2022-03-12: qty 30, 30d supply, fill #0

## 2022-03-12 NOTE — Progress Notes (Signed)
Occupational Therapy Session Note  Patient Details  Name: Franklin Anderson MRN: [id number] Date of Birth: 1971-03-21  Today's Date: 03/12/2022 OT Individual Time: 0805-0900 OT Individual Time Calculation (min): 55 min    Short Term Goals: Week 1:  OT Short Term Goal 1 (Week 1): Pt will tolerate standing 3 minutes during functional activities OT Short Term Goal 2 (Week 1): Pt will demonstrate learning of 2 pain management techniques with min A OT Short Term Goal 3 (Week 1): Pt will complete U/LB bathing with CGA OT Short Term Goal 4 (Week 1): Pt will complete LB dressing with min A  Skilled Therapeutic Interventions/Progress Updates:  Pt greeted supine in bed reporting increased pain but agreeable to OT intervention.   Pt completed functional ambulation obstacle course in gym to challenge dynamic balance with pt walking with RW over and around obstacles to simulate potential obstacles at home. Pt completed task with CGA with no LOB, education provided on trying to eliminate as many obstacles as possible at home to decrease pts risk for falls, pt verbalized understanding of education.   Attmepted sit>stands with no UE support with pt unable to stand without use of UEs even with hands on knees.   Pt completed standing BUE therex x20 OH presses with 5 lb weights with CGA, attempted chest presses however pt with pain in L clavicle from previous injury therefore terminated task   Pt completed reciprocal stair stepping with no UE support for LB strengthening/endurance for 2 mins. Pt completed task with CGA.   Worked on IADLs in kitchen with pt able to retrieve items in cabinets MOD I with rw and walker bag however pt unable to reach below knee level d/t pain, education provided on using reacher as needed, rearranging frequently used items or having wife assist as needed. Pt also able to ambulate around apt with RW and MODI to collect ADL items to simulate cleaning house, pt utilized walker bag as  needed to transport items. Pt also able to make bed with RW MODI to challenge dynamic balance.   Pt completed functional ambulation back to room with RW MODI.   Ended session with pt supine in bed.   Therapy Documentation Precautions:  Precautions Precautions: Fall Precaution Comments: scrotal edema, severe right hip pain with all mobility. Restrictions Weight Bearing Restrictions: No  Pain: unrated pain reported in RLE, utilized rest breaks, and distraction as pain mgmt interventions.     Therapy/Group: Individual Therapy  Pollyann Glen [facility] 03/12/2022, 12:04 PM

## 2022-03-12 NOTE — Progress Notes (Signed)
Physical Therapy Session Note  Patient Details  Name: Franklin Anderson MRN: [id number] Date of Birth: 1970-07-25  Today's Date: 03/12/2022 PT Individual Time: 1007-1120 PT Individual Time Calculation (min): 73 min   Short Term Goals: Week 1:  PT Short Term Goal 1 (Week 1): = to LTG due to ELOS   Skilled Therapeutic Interventions/Progress Updates:  Patient supine in bed on entrance to room. Patient alert and agreeable to PT session.   Patient with pain complaint at start of session relating radiation from hip to knee. Educated on pain referral zones from hip and not necessarily pain indicating problem at knee. Pt complains that he believes he was not provided with enough pain meds throughout stay.  Therapeutic Activity: Bed Mobility: Pt performed supine <> sit with Mod I requring extra time and no cueing to complete.  Transfers: Pt performed sit<>stand and stand pivot transfers throughout session with Mod I requiring BUE assist for power up. Provided verbal cues for technique.  Gait Training:  Pt ambulated ~150' x2 using RW with supervision/ ModI. Demonstrated increased UE support into RW with RLE stance phase. Pt is able to manage step height/ length of RLE with pain related.   Neuromuscular Re-ed: NMR facilitated during session with focus on standing balance, pain mgmt. Pt guided in sit<>stand from elevated height without use of BUE. Requires elevated height d/t pain in R hip and hand placement on knees. Attempt to lower mat table height with decreased ability to maintain hands on knees and requiring light BUE assist to power up from seat. Education re: need for more posterior foot placement and increased forward lean. Pt guided in standing balance on Airex pad with and without UE support to increase ankle musculature activation. Self perturbations with UE positioning and no LOB. Pt also guided in ambulation over low height obstacle and on/ off Airex pad.  NMR performed for improvements in  motor control and coordination, balance, sequencing, judgement, and self confidence/ efficacy in performing all aspects of mobility at highest level of independence.   Therapeutic Exercise: Pt performed the following exercises with vc/ tc for proper technique. - standing hip flexion/ abd/ ext 2x5 reps - seated LAQs x5 reps with 5 sec count concentrically, 5 sec hold in max range, and 5sec eccentric lowering - seated resisted hip abd with red t-band x10 - standing minisquats 2x5 -seated and supine ankle pumps and recommendation for additional focus to R foot in reverse "C" movements.  HEP provided as per below.   Patient ambulating to bathroom at end of session using RW and all needs within reach as pt is mod I in room with RW. Retrieved ginger ale for pt per request.    Access Code: [email or web address] URL: [email or web address] Date: 03/12/2022 Prepared by: Sharol Harness  Exercises - Standing March with Counter Support  - 1 x daily - 7 x weekly - 1 sets - 10 reps - Standing Hip Abduction  - 1 x daily - 7 x weekly - 1 sets - 10 reps - Standing Hip Extension  - 1 x daily - 7 x weekly - 1 sets - 10 reps - Standing Knee Flexion  - 1 x daily - 7 x weekly - 2 sets - 10 reps - Mini Squat with Counter Support  - 3 x daily - 7 x weekly - 1 sets - 5 reps - Seated Long Arc Quad  - 3 x daily - 7 x weekly - 1 sets - 10 reps - 5 hold - Seated  Hip Abduction with Resistance  - 3 x daily - 7 x weekly - 1 sets - 10 reps - Supine Bridge  - 3 x daily - 7 x weekly - 1 sets - 10 reps  Therapy Documentation Precautions:  Precautions Precautions: Fall Precaution Comments: scrotal edema, severe right hip pain with all mobility. Restrictions Weight Bearing Restrictions: No General:   Vital Signs:  Pain: Pain Assessment Pain Score: 4  at rest; increases to 6-7 with WB and AROM.  Therapy/Group: Individual Therapy  Loel Dubonnet PT, DPT, CSRS 03/12/2022, 1:13 PM

## 2022-03-12 NOTE — Progress Notes (Signed)
PROGRESS NOTE   Subjective/Complaints:  Improved shooting pains below the knee, per OT, pt ambulated around room without c/o discomfort   Discussed possiblity of 1 mo supply of meds [facility]   Also discussed that his new PCP is Dr Danise Mina , has appt next month   Review of Systems  Constitutional:  Negative for chills and fever.  HENT:  Negative for congestion.   Eyes:  Negative for blurred vision and double vision.  Respiratory:  Negative for shortness of breath.   Cardiovascular:  Negative for chest pain and palpitations.  Gastrointestinal:  Negative for abdominal pain, constipation, diarrhea, nausea and vomiting.  Musculoskeletal:  Positive for joint pain.  Neurological:  Positive for weakness.    Objective:   No results found. No results for input(s): "WBC", "HGB", "HCT", "PLT" in the last 72 hours.  Recent Labs    03/12/22 0528  NA 139  K 4.1  CL 103  CO2 25  GLUCOSE 113*  BUN 33*  CREATININE 1.20  CALCIUM 9.6     Intake/Output Summary (Last 24 hours) at 03/12/2022 0916 Last data filed at 03/12/2022 0804 Gross per 24 hour  Intake 880 ml  Output 250 ml  Net 630 ml      Pressure Injury 03/05/22 Tibial Posterior;Right Deep Tissue Pressure Injury - Purple or maroon localized area of discolored intact skin or blood-filled blister due to damage of underlying soft tissue from pressure and/or shear. (Active)  03/05/22 2000  Location: Tibial  Location Orientation: Posterior;Right  Staging: Deep Tissue Pressure Injury - Purple or maroon localized area of discolored intact skin or blood-filled blister due to damage of underlying soft tissue from pressure and/or shear.  Wound Description (Comments):   Present on Admission: Yes    Physical Exam: Vital Signs Blood pressure (!) 144/86, pulse 89, temperature 97.7 F (36.5 C), temperature source Oral, resp. rate 16, height 6' (1.829 m), weight  60.1 kg, SpO2 97 %.   General: No acute distress Mood and affect are appropriate Heart: Regular rate and rhythm no rubs murmurs or extra sounds Lungs: Clear to auscultation, breathing unlabored, no rales or wheezes Abdomen: Positive bowel sounds, soft nontender to palpation, nondistended Extremities: No clubbing, cyanosis, or edema Skin: warm and dry, no breakdown noted  Musculoskeletal: No abdominal tone noted Tenderness throughout RLE greatest around hip and thigh     Strength:                RUE: 5/5 SA, 5/5 EF, 5/5 EE, 5/5 WE, 5/5 FF, 5/5 FA                 LUE: 5/5 SA, 5/5 EF, 5/5 EE, 5/5 WE, 5/5 FF, 5/5 FA                 RLE: 4/5 HF, 4/5 KE, 3+/5 DF, 4/5 EHL, 4/5 PF  - limited by pain proximally but does have foot drop as well on the right side  Mod I sit to stand and stand to sit  Reduced sensation RIght dorsal> plantar foot                 LLE:  5/5 HF, 5/5 KE, 5/5 DF, 5/5 EHL, 5/5 PF     Assessment/Plan: 1. Functional deficits which require 3+ hours per day of interdisciplinary therapy in a comprehensive inpatient rehab setting. Physiatrist is providing close team supervision and 24 hour management of active medical problems listed below. Physiatrist and rehab team continue to assess barriers to discharge/monitor patient progress toward functional and medical goals  Care Tool:  Bathing    Body parts bathed by patient: Chest, Right arm, Left arm, Abdomen, Front perineal area, Left upper leg, Right upper leg, Face, Right lower leg, Left lower leg, Buttocks   Body parts bathed by helper: Right lower leg, Buttocks, Left lower leg     Bathing assist Assist Level: Independent with assistive device     Upper Body Dressing/Undressing Upper body dressing   What is the patient wearing?: Pull over shirt    Upper body assist Assist Level: Independent    Lower Body Dressing/Undressing Lower body dressing      What is the patient wearing?: Underwear/pull up, Pants      Lower body assist Assist for lower body dressing: Independent with assitive device     Toileting Toileting    Toileting assist Assist for toileting: Independent with assistive device Assistive Device Comment: uses RW for support   Transfers Chair/bed transfer  Transfers assist     Chair/bed transfer assist level: Independent with assistive device Chair/bed transfer assistive device: Arboriculturist assist      Assist level: Supervision/Verbal cueing Assistive device: Walker-rolling Max distance: 150+   Walk 10 feet activity   Assist     Assist level: Supervision/Verbal cueing Assistive device: Walker-rolling   Walk 50 feet activity   Assist    Assist level: Supervision/Verbal cueing Assistive device: Walker-rolling    Walk 150 feet activity   Assist Walk 150 feet activity did not occur: Safety/medical concerns  Assist level: Supervision/Verbal cueing Assistive device: Walker-rolling    Walk 10 feet on uneven surface  activity   Assist     Assist level: Supervision/Verbal cueing Assistive device: Walker-rolling   Wheelchair     Assist Is the patient using a wheelchair?: Yes Type of Wheelchair: Manual    Wheelchair assist level: Supervision/Verbal cueing Max wheelchair distance: 66ft    Wheelchair 50 feet with 2 turns activity    Assist        Assist Level: Total Assistance - Patient < 25%   Wheelchair 150 feet activity     Assist      Assist Level: Total Assistance - Patient < 25%   Blood pressure (!) 144/86, pulse 89, temperature 97.7 F (36.5 C), temperature source Oral, resp. rate 16, height 6' (1.829 m), weight 60.1 kg, SpO2 97 %.  Medical Problem List and Plan: 1. 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 Siblings  -Continue CIR with PT/OT  2.  Antithrombotics: -DVT/anticoagulation:  Pharmaceutical: Heparin             -antiplatelet therapy:  Aspirin 81 mg daily and Plavix 75 mg day  x21 days stopping 03/26/2022 then ASA alone for CVA prophylaxis  3. Pain Management: Oxycodone as needed taking 60mg  per day              - will d/c with 30tab of Oxy 5mg  IR - referral to [facility] for opioid maintenance program  11/13 increase gabapentin to 300mg  TID Add nortriptyline at noc 25mg  qhs 4. Mood/Behavior/Sleep: Provide emotional support             -antipsychotic agents: N/A 5. Neuropsych/cognition: This patient is capable of making decisions on his own behalf. 6. Skin/Wound Care/right outer calf pressure ulcer: Routine skin checks.  WOC follow-up wound care 7. Fluids/Electrolytes/Nutrition: Routine in and outs with follow-up chemistries 8.  Severe rhabdo/AKI.  AKI resolved - discussed no Ibuprofen after d/c                    -     Latest Ref Rng & Units 03/12/2022    5:28 AM 03/09/2022    4:51 AM 03/06/2022    6:03 AM  BMP  Glucose 70 - 99 mg/dL  03/14/2022  03/11/2022   BUN 6 - 20 mg/dL 33  37  59   Creatinine 0.61 - 1.24 mg/dL 13/01/2022  944  967   Sodium 135 - 145 mmol/L 139  144  141   Potassium 3.5 - 5.1 mmol/L 4.1  4.0  4.4   Chloride 98 - 111 mmol/L 103  108  107   CO2 22 - 32 mmol/L 25  27  22    Calcium 8.9 - 10.3 mg/dL 9.6  9.2  8.9                      9.  Elevated LFTs.  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  HCV RNA elevated.  ID to schedule follow-up outpatient 1 month  -LFTs improved on CMP 03/06/22, continue to monitor 10.  Severe muscle inflammation of right hip and gluteal muscle/crush injury.  Follow-up orthopedic service Dr. 6.38.  No surgical intervention at this time.  Weightbearing as tolerated 11.  History of polysubstance + tobacco abuse.   Provide counseling.  Urine drug screen was positive for cocaine.                  - Pt endorses Hx prior SCI 2020; endorses no IVDU since then, no residual deficits          referral to [facility] ? Methadone maintenance vs buprenorphine  12.  Hypertension.  Norvasc 10 mg daily, clonidine 0.2 mg twice daily, Lopressor 100 mg twice daily.  Monitor with increased mobility   Vitals:   03/11/22 2028 03/12/22 0409  BP: (!) 161/99 (!) 144/86  Pulse: 96 89  Resp: 18 16  Temp: 98.1 F (36.7 C) 97.7 F (36.5 C)  SpO2: 98% 97%  Discussed severe HTN if pt uses cocaine while on metoprolol, pt states this will "not happen"  13. Anemia   F/u labs with PCP   LOS: 7 days A FACE TO FACE EVALUATION WAS PERFORMED  Aundria Rud 03/12/2022, 9:16 AM

## 2022-03-12 NOTE — Progress Notes (Signed)
Inpatient Rehabilitation Discharge Medication Review by a Pharmacist  A complete drug regimen review was completed for this patient to identify any potential clinically significant medication issues.  High Risk Drug Classes Is patient taking? Indication by Medication  Antipsychotic No   Anticoagulant No   Antibiotic No   Opioid Yes Oxycodone for acute pain  Antiplatelet Yes Aspirin and clopidogrel for stroke prophylaxis   Hypoglycemics/insulin No   Vasoactive Medication Yes Amlodipine, clonidine, metoprolol for HTN  Chemotherapy No   Other Yes Gabapentin for nerve pain  Nicotine patch for tobacco cessation Nortriptyline for sleep     Type of Medication Issue Identified Description of Issue Recommendation(s)  Drug Interaction(s) (clinically significant)  If patient resumes using cocaine, metoprolol, which was a home medication, may theoretically cause unopposed alpha-stimulation, acute BP rise and sudden death though some reviews show this is very rare with weak data to support Discussed with team, plan to have patient follow up at drug rehab to maintain abstinence. OK to continue metoprolol for now  Duplicate Therapy     Allergy     No Medication Administration End Date     Incorrect Dose     Additional Drug Therapy Needed     Significant med changes from prior encounter (inform family/care partners about these prior to discharge). STOP TAKING BC powder, ibuprofen, losartan 50  INCREASED metoprolol tartrate dose  NEW amlodipine, aspirin, clonidine, clopidogrel, gabapentin, diclofenac gel, lidocaine patch, nicotine patch, nortriptyline, and oxycodone Team to educate before discharge   Other       Clinically significant medication issues were identified that warrant physician communication and completion of prescribed/recommended actions by midnight of the next day: Yes   Name of provider notified for urgent issues identified: Deatra Ina  Provider Method of Notification:  Secure chat     Pharmacist comments: Clinically significant medication issue with metoprolol was resolved per above.     Time spent performing this drug regimen review (minutes):  30  Alphia Moh, PharmD, [place], [facility] Clinical Pharmacist  Please check AMION for all [facility] Pharmacy phone numbers After 10:00 PM, call Main Pharmacy [phone]

## 2022-03-12 NOTE — Progress Notes (Signed)
Patient ID: Franklin Anderson, male   DOB: 12-11-1970, 51 y.o.   MRN: [id number]  RW ordered through [facility]

## 2022-03-12 NOTE — Progress Notes (Signed)
Physical Therapy Session Note  Patient Details  Name: Franklin Anderson MRN: [id number] Date of Birth: [birth year]  Today's Date: 03/12/2022 PT Individual Time: 1300-1358 PT Individual Time Calculation (min): 58 min   Short Term Goals: Week 1:  PT Short Term Goal 1 (Week 1): = to LTG due to ELOS  Skilled Therapeutic Interventions/Progress Updates:  Pt supine in bed on entrance with RN present attending to leg wound. Patient alert and agreeable to treatment session. Pt transported downstairs to perform therapy session outside to improve Pt motivation.   Therapeutic Activity:  Transfers: Pt supine to sit with Mod I. Patient performed sit to stand, stand pivot, and ambulatory transfers throughout treatment session using RW with close supervision.  Stairs: Ambulated x4 up & down 6 steps using single hand rail with close supervision. Performed lateral step ups (3x6) BLE. Cued for foot positioning and maintaining  erect posture.   Standing balance:  Pt successfully retrieved object from even surface x2 using grab & reach stick with one hand on RW and no verbal cues.  Gait: Pt performed gait x 4 with direction changes around stationary columns positioned 6 ft apart, distance progressed from 50 ft to 100 ft using RW with close supervision. Pt longest distance of ambulating was 150 ft before requiring rest break.  Pt showed increase foot drop with decrease gait speed and foot step on right. Provided with verbal cuing for increase right knee elevation. Ambulated 40 ft w/ uneven surface w/ moderate incline/decline.   Pt transported back to room,transfered to bed in supine position using RW at Mod I.  Pt supine with call/bell close by and all needs met.     Therapy Documentation Precautions:  Precautions Precautions: Fall Precaution Comments: scrotal edema, severe right hip pain with all mobility. Restrictions Weight Bearing Restrictions: No   Pain: Pain Assessment Pain Score: 3      Therapy/Group: Individual Therapy  Hilary Hertz PT, SPT  Hilary Hertz 03/12/2022, 3:07 PM

## 2022-03-12 NOTE — Progress Notes (Signed)
Orthopedic Tech Progress Note Patient Details:  Franklin Anderson Mar 09, 1971 [id number] Called in order to [facility] for foot up Patient ID: Franklin Anderson, male   DOB: 22-Jul-1970, 51 y.o.   MRN: [id number]  Lovett Calender 03/12/2022, 9:13 AM

## 2022-03-12 NOTE — Progress Notes (Signed)
Patient being DC home tomorrow. Patient educated on wound care to his right posterior Tibia. Patient understands to cleanse with soap and water, pat dry apply medi honey directly to open area and cover with foam dressing. This is to be completed daily and PRN. Patient states he is going home with his wife. Will educate wife tomorrow.

## 2022-03-13 ENCOUNTER — Other Ambulatory Visit (HOSPITAL_COMMUNITY): Payer: Self-pay

## 2022-03-13 DIAGNOSIS — G5731 Lesion of lateral popliteal nerve, right lower limb: Secondary | ICD-10-CM

## 2022-03-13 DIAGNOSIS — M21371 Foot drop, right foot: Secondary | ICD-10-CM

## 2022-03-13 NOTE — Progress Notes (Signed)
Physical Therapy Discharge Summary  Patient Details  Name: Franklin Anderson MRN: [id number] Date of Birth: 11/01/1970  Date of Discharge from PT service:November 74, 2023  Today's Date: 03/12/2022   Patient has met 8 of 9 long term goals due to improved activity tolerance, improved balance, increased strength, decreased pain, and ability to compensate for deficits.  Patient to discharge at an ambulatory level Modified Independent.   Patient's care partner is independent to provide the necessary physical assistance at discharge.  Reasons goals not met: Pt on track to meet ambulation distance of 300 ft in LTG but currently reaching 150 ft regularly with longest distance of 239f. With continuation of HEP and daily walking program, pt's pain expected to decrease with ability to reach longer distances.   Recommendation:  Patient will benefit from ongoing skilled PT services in outpatient setting to continue to advance safe functional mobility, address ongoing impairments in strength, coordination, balance, activity tolerance, safety awareness, and to minimize fall risk.  Equipment: RW  Reasons for discharge: treatment goals met and discharge from hospital  Patient/family agrees with progress made and goals achieved: Yes  PT Discharge Precautions/Restrictions Precautions Precautions: Fall Precaution Comments: severe right hip pain with all mobilitythat reaches to knee Other Brace: Toe/ Foot Up brace ordered for pt Restrictions Weight Bearing Restrictions: No Vital Signs  Pain Pain Assessment Pain Scale: 0-10 Pain Score: 4  Faces Pain Scale: No hurt Pain Type: Acute pain Pain Location: Hip Pain Orientation: Right Pain Descriptors / Indicators: Aching;Guarding;Discomfort;Constant Pain Frequency: Constant Pain Onset: On-going Patients Stated Pain Goal: 0 Pain Intervention(s): Repositioned Multiple Pain Sites: No PAINAD (Pain Assessment in Advanced Dementia) Breathing:  normal Negative Vocalization: none Facial Expression: smiling or inexpressive Body Language: relaxed Consolability: no need to console PAINAD Score: 0 Pain Interference Pain Interference Pain Effect on Sleep: 4. Almost constantly Pain Interference with Therapy Activities: 2. Occasionally Pain Interference with Day-to-Day Activities: 4. Almost constantly Vision/Perception  Vision - History Ability to See in Adequate Light: 0 Adequate Perception Perception: Within Functional Limits Praxis Praxis: Intact  Cognition Overall Cognitive Status: Within Functional Limits for tasks assessed Arousal/Alertness: Awake/alert Orientation Level: Oriented X4 Memory: Appears intact Awareness: Appears intact Problem Solving: Appears intact Behaviors: Other (comment) (Focused on pain and perceived treatment from healthcare d/t previous addictions.) Safety/Judgment: Appears intact Sensation Sensation Light Touch: Appears Intact Additional Comments: numbness and tingling throughout R LE, decreased light touch sensation in R foot compared to L foot. Coordination Gross Motor Movements are Fluid and Coordinated: No Fine Motor Movements are Fluid and Coordinated: Yes Coordination and Movement Description: Decreased movement in R LE due to pain. Coordinated in L LE. Motor  Motor Motor - Discharge Observations: WFL in UEs; decreased motor in RLE d/t overriding pain  Mobility Bed Mobility Bed Mobility: Rolling Right;Supine to Sit;Sit to Supine Rolling Right: Independent Supine to Sit: Independent Sit to Supine: Independent Transfers Transfers: Sit to Stand;Stand to Sit;Stand Pivot Transfers Sit to Stand: Independent with assistive device Stand to Sit: Independent with assistive device Stand Pivot Transfers: Independent with assistive device Transfer (Assistive device): Rolling walker Locomotion  Gait Ambulation: Yes Gait Assistance: Independent with assistive device Gait Distance (Feet): 220  Feet Assistive device: Rolling walker Gait Gait: Yes Gait Pattern: Impaired Gait Pattern: Step-to pattern;Decreased hip/knee flexion - right;Antalgic;Decreased stance time - right;Decreased dorsiflexion - right Gait velocity: decreased Stairs / Additional Locomotion Stairs: Yes Stairs Assistance: Independent with assistive device Stair Management Technique: Two rails;Forwards Number of Stairs: 16 Height of Stairs: 6 Ramp:  Independent with assistive device Curb: Independent with assistive Production manager Mobility: Yes Wheelchair Assistance: Independent with Camera operator: Both upper extremities Wheelchair Parts Management: Supervision/cueing Distance: 150 ft  Trunk/Postural Assessment  Cervical Assessment Cervical Assessment: Within Functional Limits Thoracic Assessment Thoracic Assessment: Within Functional Limits Lumbar Assessment Lumbar Assessment: Within Functional Limits Postural Control Postural Control: Within Functional Limits  Balance Balance Balance Assessed: Yes Static Sitting Balance Static Sitting - Balance Support: Feet supported;Bilateral upper extremity supported Static Sitting - Level of Assistance: 7: Independent Dynamic Sitting Balance Dynamic Sitting - Balance Support: During functional activity;Feet supported;No upper extremity supported Dynamic Sitting - Level of Assistance: 7: Independent Static Standing Balance Static Standing - Balance Support: During functional activity;Bilateral upper extremity supported Static Standing - Level of Assistance: 6: Modified independent (Device/Increase time) Dynamic Standing Balance Dynamic Standing - Balance Support: During functional activity;Bilateral upper extremity supported Dynamic Standing - Level of Assistance: 6: Modified independent (Device/Increase time) Extremity Assessment      RLE Assessment RLE Assessment: Exceptions to [facility] Passive Range of Motion  (PROM) Comments: impaired due to pain Active Range of Motion (AROM) Comments: impaired due to pain RLE Strength Right Hip Flexion: 3-/5 Right Knee Flexion: 3+/5 Right Knee Extension: 3-/5 Right Ankle Dorsiflexion: 3-/5 Right Ankle Plantar Flexion: 3-/5 LLE Assessment LLE Assessment: Within Functional Limits LLE Strength Left Hip Flexion: 4+/5 Left Knee Flexion: 5/5 Left Knee Extension: 5/5 Left Ankle Dorsiflexion: 5/5 Left Ankle Plantar Flexion: 5/5   Alger Simons 03/12/2022, 1:33 PM

## 2022-03-13 NOTE — Progress Notes (Signed)
Inpatient Rehabilitation Care Coordinator Discharge Note   Patient Details  Name: Franklin Anderson MRN: [id number] Date of Birth: June 15, 1970   Discharge location: Home W/ Spouse  Length of Stay: 8 Days  Discharge activity level: AMB MOD I  Home/community participation: SPOUSE  Patient response BP:ZWCHEN Literacy - How often do you need to have someone help you when you read instructions, pamphlets, or other written material from your doctor or pharmacy?: Never  Patient response ID:[id number] Isolation - How often do you feel lonely or isolated from those around you?: Never  Services provided included: MD, RD, PT, OT, SLP, RN, CM, TR, Pharmacy, Neuropsych, SW  Financial Services:  Field seismologist Utilized: Other (Comment) (uninsured)    Choices offered to/list presented to: HEP  Follow-up services arranged:  Other (Comment) (HEP &Follow-up [facility] [phone] between the hours of 5 AM and 9 AM 03/16/2022.  Please bring $14 for co-pay as well as form of identification)           Patient response to transportation need: Is the patient able to respond to transportation needs?: Yes In the past 12 months, has lack of transportation kept you from medical appointments or from getting medications?: No In the past 12 months, has lack of transportation kept you from meetings, work, or from getting things needed for daily living?: No    Comments (or additional information):  Patient/Family verbalized understanding of follow-up arrangements:  Yes  Individual responsible for coordination of the follow-up plan: self or spouse  Confirmed correct DME delivered: Andria Rhein 03/13/2022    Andria Rhein
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
## 2022-03-13 NOTE — Plan of Care (Signed)
  Problem: RH Ambulation Goal: LTG Patient will ambulate in controlled environment (PT) Description: LTG: Patient will ambulate in a controlled environment, # of feet with assistance (PT). Outcome: Progressing Flowsheets Taken 03/13/2022 1610 by Alger Simons, PT LTG: Ambulation distance in controlled environment: Reaching up to 220 ft but expected to be able to reach longer distances with decrease in pain Taken 03/06/2022 1335 by Kayleen Memos, Student-PT LTG: Pt will ambulate in controlled environ  assist needed:: Independent with assistive device   Problem: RH Balance Goal: LTG Patient will maintain dynamic sitting balance (PT) Description: LTG:  Patient will maintain dynamic sitting balance with assistance during mobility activities (PT) Outcome: Completed/Met Flowsheets (Taken 03/06/2022 1335 by Kayleen Memos, Student-PT) LTG: Pt will maintain dynamic sitting balance during mobility activities with:: Independent with assistive device  Goal: LTG Patient will maintain dynamic standing balance (PT) Description: LTG:  Patient will maintain dynamic standing balance with assistance during mobility activities (PT) Outcome: Completed/Met Flowsheets (Taken 03/06/2022 1335 by Kayleen Memos, Student-PT) LTG: Pt will maintain dynamic standing balance during mobility activities with:: Independent with assistive device    Problem: Sit to Stand Goal: LTG:  Patient will perform sit to stand with assistance level (PT) Description: LTG:  Patient will perform sit to stand with assistance level (PT) Outcome: Completed/Met Flowsheets (Taken 03/06/2022 1335 by Kayleen Memos, Student-PT) LTG: PT will perform sit to stand in preparation for functional mobility with assistance level: Independent with assistive device   Problem: RH Bed Mobility Goal: LTG Patient will perform bed mobility with assist (PT) Description: LTG: Patient will perform bed mobility with assistance, with/without cues (PT). Outcome:  Completed/Met Flowsheets (Taken 03/06/2022 1335 by Kayleen Memos, Student-PT) LTG: Pt will perform bed mobility with assistance level of: Independent with assistive device    Problem: RH Bed to Chair Transfers Goal: LTG Patient will perform bed/chair transfers w/assist (PT) Description: LTG: Patient will perform bed to chair transfers with assistance (PT). Outcome: Completed/Met Flowsheets (Taken 03/06/2022 1335 by Kayleen Memos, Student-PT) LTG: Pt will perform Bed to Chair Transfers with assistance level: Independent with assistive device    Problem: RH Car Transfers Goal: LTG Patient will perform car transfers with assist (PT) Description: LTG: Patient will perform car transfers with assistance (PT). Outcome: Completed/Met Flowsheets (Taken 03/06/2022 1335 by Kayleen Memos, Student-PT) LTG: Pt will perform car transfers with assist:: Independent with assistive device    Problem: RH Ambulation Goal: LTG Patient will ambulate in home environment (PT) Description: LTG: Patient will ambulate in home environment, # of feet with assistance (PT). Outcome: Completed/Met Flowsheets Taken 03/13/2022 9604 by Alger Simons, PT LTG: Pt will ambulate in home environ  assist needed:: Independent with assistive device Taken 03/06/2022 1335 by Kayleen Memos, Student-PT LTG: Ambulation distance in home environment: 129f With LRAD   Problem: RH Stairs Goal: LTG Patient will ambulate up and down stairs w/assist (PT) Description: LTG: Patient will ambulate up and down # of stairs with assistance (PT) Outcome: Completed/Met Flowsheets (Taken 03/06/2022 1335 by KKayleen Memos Student-PT) LTG: Pt will ambulate up/down stairs assist needed:: Independent with assistive device LTG: Pt will  ambulate up and down number of stairs: 4 steps with B HR support

## 2022-03-16 ENCOUNTER — Observation Stay (HOSPITAL_COMMUNITY)
Admission: EM | Admit: 2022-03-16 | Discharge: 2022-03-17 | Disposition: A | Discharge status: Home / Self Care | Payer: Self-pay | Attending: Internal Medicine | Admitting: Internal Medicine

## 2022-03-16 ENCOUNTER — Emergency Department (HOSPITAL_COMMUNITY): Payer: Self-pay

## 2022-03-16 DIAGNOSIS — I10 Essential (primary) hypertension: Secondary | ICD-10-CM | POA: Insufficient documentation

## 2022-03-16 DIAGNOSIS — R400 Somnolence: Secondary | ICD-10-CM | POA: Insufficient documentation

## 2022-03-16 DIAGNOSIS — Z7982 Long term (current) use of aspirin: Secondary | ICD-10-CM | POA: Insufficient documentation

## 2022-03-16 DIAGNOSIS — Z7902 Long term (current) use of antithrombotics/antiplatelets: Secondary | ICD-10-CM | POA: Insufficient documentation

## 2022-03-16 DIAGNOSIS — F1910 Other psychoactive substance abuse, uncomplicated: Secondary | ICD-10-CM | POA: Diagnosis present

## 2022-03-16 DIAGNOSIS — F17210 Nicotine dependence, cigarettes, uncomplicated: Secondary | ICD-10-CM | POA: Insufficient documentation

## 2022-03-16 DIAGNOSIS — Z8673 Personal history of transient ischemic attack (TIA), and cerebral infarction without residual deficits: Secondary | ICD-10-CM | POA: Insufficient documentation

## 2022-03-16 DIAGNOSIS — T40601A Poisoning by unspecified narcotics, accidental (unintentional), initial encounter: Principal | ICD-10-CM | POA: Diagnosis present

## 2022-03-16 DIAGNOSIS — Z79899 Other long term (current) drug therapy: Secondary | ICD-10-CM | POA: Insufficient documentation

## 2022-03-16 LAB — I-STAT VENOUS BLOOD GAS, ED
Acid-base deficit: 1 mmol/L (ref 0.0–2.0)
Bicarbonate: 25.5 mmol/L (ref 20.0–28.0)
Calcium, Ion: 1.24 mmol/L (ref 1.15–1.40)
HCT: 35 % — ABNORMAL LOW (ref 39.0–52.0)
Hemoglobin: 11.9 g/dL — ABNORMAL LOW (ref 13.0–17.0)
O2 Saturation: 68 %
Potassium: 3.9 mmol/L (ref 3.5–5.1)
Sodium: 141 mmol/L (ref 135–145)
TCO2: 27 mmol/L (ref 22–32)
pCO2, Ven: 51 mmHg (ref 44–60)
pH, Ven: 7.308 (ref 7.25–7.43)
pO2, Ven: 39 mmHg (ref 32–45)

## 2022-03-16 LAB — COMPREHENSIVE METABOLIC PANEL
ALT: 36 U/L (ref 0–44)
AST: 32 U/L (ref 15–41)
Albumin: 3.9 g/dL (ref 3.5–5.0)
Alkaline Phosphatase: 65 U/L (ref 38–126)
Anion gap: 13 (ref 5–15)
BUN: 24 mg/dL — ABNORMAL HIGH (ref 6–20)
CO2: 24 mmol/L (ref 22–32)
Calcium: 9.5 mg/dL (ref 8.9–10.3)
Chloride: 103 mmol/L (ref 98–111)
Creatinine, Ser: 1.15 mg/dL (ref 0.61–1.24)
GFR, Estimated: >60 mL/min (ref >60)
Glucose, Bld: 101 mg/dL — ABNORMAL HIGH (ref 70–99)
Potassium: 4 mmol/L (ref 3.5–5.1)
Sodium: 140 mmol/L (ref 135–145)
Total Bilirubin: 0.3 mg/dL (ref 0.3–1.2)
Total Protein: 7.3 g/dL (ref 6.5–8.1)

## 2022-03-16 LAB — CBC WITH DIFFERENTIAL/PLATELET
Abs Immature Granulocytes: 0.03 10*3/uL (ref 0.00–0.07)
Basophils Absolute: 0 10*3/uL (ref 0.0–0.1)
Basophils Relative: 0 %
Eosinophils Absolute: 0.1 10*3/uL (ref 0.0–0.5)
Eosinophils Relative: 1 %
HCT: 35.4 % — ABNORMAL LOW (ref 39.0–52.0)
Hemoglobin: 11.8 g/dL — ABNORMAL LOW (ref 13.0–17.0)
Immature Granulocytes: 0 %
Lymphocytes Relative: 7 %
Lymphs Abs: 0.7 10*3/uL (ref 0.7–4.0)
MCH: 32.9 pg (ref 26.0–34.0)
MCHC: 33.3 g/dL (ref 30.0–36.0)
MCV: 98.6 fL (ref 80.0–100.0)
Monocytes Absolute: 0.5 10*3/uL (ref 0.1–1.0)
Monocytes Relative: 5 %
Neutro Abs: 8.8 10*3/uL — ABNORMAL HIGH (ref 1.7–7.7)
Neutrophils Relative %: 87 %
Platelets: 511 10*3/uL — ABNORMAL HIGH (ref 150–400)
RBC: 3.59 MIL/uL — ABNORMAL LOW (ref 4.22–5.81)
RDW: 12.1 % (ref 11.5–15.5)
WBC: 10.2 10*3/uL (ref 4.0–10.5)
nRBC: 0 % (ref 0.0–0.2)

## 2022-03-16 LAB — LACTIC ACID, PLASMA: Lactic Acid, Venous: 3.1 mmol/L (ref 0.5–1.9)

## 2022-03-16 LAB — TROPONIN I (HIGH SENSITIVITY): Troponin I (High Sensitivity): 17 ng/L (ref <18)

## 2022-03-16 LAB — CBG MONITORING, ED: Glucose-Capillary: 90 mg/dL (ref 70–99)

## 2022-03-16 LAB — ETHANOL: Alcohol, Ethyl (B): <10 mg/dL (ref <10)

## 2022-03-16 LAB — CK: Total CK: 301 U/L (ref 49–397)

## 2022-03-16 MED ORDER — LACTATED RINGERS IV BOLUS
1000.0000 mL | Freq: Once | INTRAVENOUS | Status: AC
  Administered 2022-03-16: 1000 mL via INTRAVENOUS

## 2022-03-16 MED ORDER — SODIUM CHLORIDE 0.9 % IV SOLN
3500.0000 mg | Freq: Once | INTRAVENOUS | Status: AC
  Administered 2022-03-17: 3500 mg via INTRAVENOUS
  Filled 2022-03-16: qty 35

## 2022-03-16 NOTE — ED Provider Notes (Signed)
[facility] EMERGENCY DEPARTMENT Provider Note   CSN: [id number] Arrival date & time: 03/16/22  2109     History {Add pertinent medical, surgical, social history, OB history to HPI:1} Chief Complaint  Patient presents with   Altered Mental Status    LUKIS BUNT is a 51 y.o. male.   Altered Mental Status  The patient is a 51 year old male with past medical history of recent CVA (discharged 3 days ago), HTN, polysubstance use, presenting from home by EMS for altered mental status.  Per EMS, they were called to scene after the patient became acutely altered at around 1830 this evening.  They were called out because the patient stopped speaking and was flailing his arms around wildly.  On EMSs arrival, the patient was still flailing his arms however, during transport patient became unresponsive with a GCS of 3.  Patient was noted to be tachycardic with heart rate around 110, but otherwise hemodynamically stable.  EMS was unable to obtain a temperature however, they were able to obtain a blood sugar which was within normal limits.     Home Medications Prior to Admission medications   Medication Sig Start Date End Date Taking? Authorizing Provider  amLODipine (NORVASC) 10 MG tablet Take 1 tablet (10 mg total) by mouth daily. 03/12/22   Angiulli, Mcarthur Rossetti, PA-C  aspirin 81 MG chewable tablet Chew 1 tablet (81 mg total) by mouth daily. 03/05/22   Standley Brooking, MD  cloNIDine (CATAPRES) 0.2 MG tablet Take 1 tablet (0.2 mg total) by mouth 2 (two) times daily. 03/12/22   Angiulli, Mcarthur Rossetti, PA-C  clopidogrel (PLAVIX) 75 MG tablet Take 1 tablet (75 mg total) by mouth daily. 03/12/22   Angiulli, Mcarthur Rossetti, PA-C  diclofenac Sodium (VOLTAREN) 1 % GEL Apply 4 g topically 4 (four) times daily. 03/12/22   Angiulli, Mcarthur Rossetti, PA-C  gabapentin (NEURONTIN) 300 MG capsule Take 1 capsule (300 mg total) by mouth 3 (three) times daily. 03/12/22   Angiulli, Mcarthur Rossetti, PA-C   leptospermum manuka honey (MEDIHONEY) PSTE paste Apply 1 Application topically daily. 03/12/22   Angiulli, Mcarthur Rossetti, PA-C  lidocaine (LIDODERM) 5 % Place 1 patch onto the skin daily. Remove & Discard patch within 12 hours or as directed by MD 03/12/22   Angiulli, Mcarthur Rossetti, PA-C  metoprolol tartrate (LOPRESSOR) 100 MG tablet Take 1 tablet (100 mg total) by mouth 2 (two) times daily. 03/12/22   Angiulli, Mcarthur Rossetti, PA-C  nicotine (NICODERM CQ - DOSED IN MG/24 HOURS) 14 mg/24hr patch 14 mg patch daily x3 weeks then 7 mg patch daily x3 weeks and stop 03/12/22   Angiulli, Mcarthur Rossetti, PA-C  nortriptyline (PAMELOR) 25 MG capsule Take 1 capsule (25 mg total) by mouth at bedtime. 03/12/22   Angiulli, Mcarthur Rossetti, PA-C  oxyCODONE (OXY IR/ROXICODONE) 5 MG immediate release tablet Take 1-2 tablets (5-10 mg total) by mouth every 4 (four) hours as needed for moderate pain or severe pain. 03/12/22   Angiulli, Mcarthur Rossetti, PA-C      Allergies    Sulfa antibiotics and Penicillins    Review of Systems   Review of Systems  See HPI  Physical Exam Updated Vital Signs Pulse (!) 107   Resp 20   SpO2 97% Comment: [place] @ 2LPM Physical Exam Vitals and nursing note reviewed.  Constitutional:      General: He is not in acute distress.    Appearance: He is well-developed.     Comments: GCS 10 (E4:  V1: M5)  HENT:     Head: Normocephalic and atraumatic.     Nose: Nose normal.  Eyes:     Conjunctiva/sclera: Conjunctivae normal.  Cardiovascular:     Rate and Rhythm: Regular rhythm. Tachycardia present.     Heart sounds: No murmur heard. Pulmonary:     Effort: Pulmonary effort is normal. No respiratory distress.     Breath sounds: Normal breath sounds.  Abdominal:     Palpations: Abdomen is soft.     Tenderness: There is no abdominal tenderness.  Musculoskeletal:        General: No swelling.     Cervical back: Neck supple.  Skin:    General: Skin is warm and dry.     Capillary Refill: Capillary refill takes less  than 2 seconds.     ED Results / Procedures / Treatments   Labs (all labs ordered are listed, but only abnormal results are displayed) Labs Reviewed  COMPREHENSIVE METABOLIC PANEL  LACTIC ACID, PLASMA  LACTIC ACID, PLASMA  CBC WITH DIFFERENTIAL/PLATELET  URINALYSIS, ROUTINE W REFLEX MICROSCOPIC  RAPID URINE DRUG SCREEN, HOSP PERFORMED  CK  ETHANOL  CBG MONITORING, ED  I-STAT VENOUS BLOOD GAS, ED  CBG MONITORING, ED  TROPONIN I (HIGH SENSITIVITY)    EKG EKG Interpretation  Date/Time:  Monday March 16 2022 21:10:45 EST Ventricular Rate:  104 PR Interval:  150 QRS Duration: 82 QT Interval:  364 QTC Calculation: 479 R Axis:   58 Text Interpretation: Sinus tachycardia Left atrial enlargement Left ventricular hypertrophy ST elev, probable normal early repol pattern Borderline prolonged QT interval Confirmed by Davonna Belling [phone]) on 03/16/2022 9:18:12 PM  Radiology No results found.  Procedures Procedures  {Document cardiac monitor, telemetry assessment procedure when appropriate:1}  Medications Ordered in ED Medications - No data to display  ED Course/ Medical Decision Making/ A&P                           Medical Decision Making The patient is a 51 year old male with past medical history of recent CVA (discharged 3 days ago), HTN, polysubstance use, presenting from home by EMS for altered mental status.  The differential diagnosis considered includes: Seizure, CVA, substance use, intoxication, medication side effect, metabolic abnormality, sepsis, infection, hypoglycemia, thyroid dysfunction.  On initial evaluation, the patient was tachycardic with a heart rate of 107.  He was noted to have a GCS of 10 (E4: V1: M5).  The patient's work-up included a point-of-care glucose of 90.  His EKG showed left ventricular hypertrophy, sinus tachycardia, peaked T waves in leads V3 through V5 with ST segment elevation noted in V4-V5 without reciprocal changes.  The  patient's diagnostic work-up included a VBG which showed pH of 7.308 with PCO2 of 51.0; CK of 301; troponin 17; lactic acid elevated to 3.1; ethanol less than 10; CBC with white blood cell count of 10.2 hemoglobin 11.8; CMP with BUN elevated 24 but without additional significant abnormality.  The patient had a UDS and urinalysis which were ordered and pending.  The patient's imaging included a chest x-ray which showed patchy bilateral mid and left lower lung opacities which may reflect multifocal aspiration or pneumonia; he also received a CT head which showed no acute intracranial abnormality with decreased attenuation at the site of recent acute lacunar infarct.   Amount and/or Complexity of Data Reviewed Independent Historian: EMS External Data Reviewed: labs, radiology and notes. Labs: ordered. Decision-making details documented in ED Course.  Radiology: ordered and independent interpretation performed. Decision-making details documented in ED Course. ECG/medicine tests: ordered and independent interpretation performed. Decision-making details documented in ED Course.   ***  {Document critical care time when appropriate:1} {Document review of labs and clinical decision tools ie heart score, Chads2Vasc2 etc:1}  {Document your independent review of radiology images, and any outside records:1} {Document your discussion with family members, caretakers, and with consultants:1} {Document social determinants of health affecting pt's care:1} {Document your decision making why or why not admission, treatments were needed:1} Final Clinical Impression(s) / ED Diagnoses Final diagnoses:  None    Rx / DC Orders ED Discharge Orders     None

## 2022-03-16 NOTE — ED Provider Notes (Incomplete)
[facility] EMERGENCY DEPARTMENT Provider Note   CSN: [id number] Arrival date & time: 03/16/22  2109     History {Add pertinent medical, surgical, social history, OB history to HPI:1} Chief Complaint  Patient presents with   Altered Mental Status    Franklin Anderson is a 51 y.o. male.   Altered Mental Status  The patient is a 51 year old male with past medical history of CVA (discharged 3 days ago with right-sided deficits), hypertension, polysubstance use who is presenting with EMS for altered mental status.  Per EMS they were called to scene after the patient became altered at home and was flailing his arms.  He was apparently witnessed by roommates to be in his usual state of health throughout the day and became acutely altered at around 1830 this evening.  EMS reports that the patient became unresponsive with a GCS of 3 and route.  He was tachycardic to around 110 but otherwise normotensive.    Home Medications Prior to Admission medications   Medication Sig Start Date End Date Taking? Authorizing Provider  amLODipine (NORVASC) 10 MG tablet Take 1 tablet (10 mg total) by mouth daily. 03/12/22   Angiulli, Lavon Paganini, PA-C  aspirin 81 MG chewable tablet Chew 1 tablet (81 mg total) by mouth daily. 03/05/22   Samuella Cota, MD  cloNIDine (CATAPRES) 0.2 MG tablet Take 1 tablet (0.2 mg total) by mouth 2 (two) times daily. 03/12/22   Angiulli, Lavon Paganini, PA-C  clopidogrel (PLAVIX) 75 MG tablet Take 1 tablet (75 mg total) by mouth daily. 03/12/22   Angiulli, Lavon Paganini, PA-C  diclofenac Sodium (VOLTAREN) 1 % GEL Apply 4 g topically 4 (four) times daily. 03/12/22   Angiulli, Lavon Paganini, PA-C  gabapentin (NEURONTIN) 300 MG capsule Take 1 capsule (300 mg total) by mouth 3 (three) times daily. 03/12/22   Angiulli, Lavon Paganini, PA-C  leptospermum manuka honey (MEDIHONEY) PSTE paste Apply 1 Application topically daily. 03/12/22   Angiulli, Lavon Paganini, PA-C  lidocaine (LIDODERM) 5 %  Place 1 patch onto the skin daily. Remove & Discard patch within 12 hours or as directed by MD 03/12/22   Angiulli, Lavon Paganini, PA-C  metoprolol tartrate (LOPRESSOR) 100 MG tablet Take 1 tablet (100 mg total) by mouth 2 (two) times daily. 03/12/22   Angiulli, Lavon Paganini, PA-C  nicotine (NICODERM CQ - DOSED IN MG/24 HOURS) 14 mg/24hr patch 14 mg patch daily x3 weeks then 7 mg patch daily x3 weeks and stop 03/12/22   Angiulli, Lavon Paganini, PA-C  nortriptyline (PAMELOR) 25 MG capsule Take 1 capsule (25 mg total) by mouth at bedtime. 03/12/22   Angiulli, Lavon Paganini, PA-C  oxyCODONE (OXY IR/ROXICODONE) 5 MG immediate release tablet Take 1-2 tablets (5-10 mg total) by mouth every 4 (four) hours as needed for moderate pain or severe pain. 03/12/22   Angiulli, Lavon Paganini, PA-C      Allergies    Sulfa antibiotics and Penicillins    Review of Systems   Review of Systems  Physical Exam Updated Vital Signs Pulse (!) 107   Resp 20   SpO2 97% Comment: [place] @ 2LPM Physical Exam  ED Results / Procedures / Treatments   Labs (all labs ordered are listed, but only abnormal results are displayed) Labs Reviewed  COMPREHENSIVE METABOLIC PANEL  LACTIC ACID, PLASMA  LACTIC ACID, PLASMA  CBC WITH DIFFERENTIAL/PLATELET  URINALYSIS, ROUTINE W REFLEX MICROSCOPIC  RAPID URINE DRUG SCREEN, HOSP PERFORMED  CK  ETHANOL  I-STAT VENOUS BLOOD  GAS, ED  TROPONIN I (HIGH SENSITIVITY)    EKG EKG Interpretation  Date/Time:  Monday March 16 2022 21:10:45 EST Ventricular Rate:  104 PR Interval:  150 QRS Duration: 82 QT Interval:  364 QTC Calculation: 479 R Axis:   58 Text Interpretation: Sinus tachycardia Left atrial enlargement Left ventricular hypertrophy ST elev, probable normal early repol pattern Borderline prolonged QT interval Confirmed by Benjiman Core [phone]) on 03/16/2022 9:18:12 PM  Radiology No results found.  Procedures Procedures  {Document cardiac monitor, telemetry assessment procedure when  appropriate:1}  Medications Ordered in ED Medications - No data to display  ED Course/ Medical Decision Making/ A&P                           Medical Decision Making Amount and/or Complexity of Data Reviewed Labs: ordered. Radiology: ordered.   ***  {Document critical care time when appropriate:1} {Document review of labs and clinical decision tools ie heart score, Chads2Vasc2 etc:1}  {Document your independent review of radiology images, and any outside records:1} {Document your discussion with family members, caretakers, and with consultants:1} {Document social determinants of health affecting pt's care:1} {Document your decision making why or why not admission, treatments were needed:1} Final Clinical Impression(s) / ED Diagnoses Final diagnoses:  None    Rx / DC Orders ED Discharge Orders     None

## 2022-03-16 NOTE — ED Provider Notes (Incomplete)
[facility] EMERGENCY DEPARTMENT Provider Note   CSN: [id number] Arrival date & time: 03/16/22  2109     History {Add pertinent medical, surgical, social history, OB history to HPI:1} Chief Complaint  Patient presents with   Altered Mental Status    Franklin Anderson is a 51 y.o. male.   Altered Mental Status      Home Medications Prior to Admission medications   Medication Sig Start Date End Date Taking? Authorizing Provider  amLODipine (NORVASC) 10 MG tablet Take 1 tablet (10 mg total) by mouth daily. 03/12/22   Angiulli, Mcarthur Rossetti, PA-C  aspirin 81 MG chewable tablet Chew 1 tablet (81 mg total) by mouth daily. 03/05/22   Standley Brooking, MD  cloNIDine (CATAPRES) 0.2 MG tablet Take 1 tablet (0.2 mg total) by mouth 2 (two) times daily. 03/12/22   Angiulli, Mcarthur Rossetti, PA-C  clopidogrel (PLAVIX) 75 MG tablet Take 1 tablet (75 mg total) by mouth daily. 03/12/22   Angiulli, Mcarthur Rossetti, PA-C  diclofenac Sodium (VOLTAREN) 1 % GEL Apply 4 g topically 4 (four) times daily. 03/12/22   Angiulli, Mcarthur Rossetti, PA-C  gabapentin (NEURONTIN) 300 MG capsule Take 1 capsule (300 mg total) by mouth 3 (three) times daily. 03/12/22   Angiulli, Mcarthur Rossetti, PA-C  leptospermum manuka honey (MEDIHONEY) PSTE paste Apply 1 Application topically daily. 03/12/22   Angiulli, Mcarthur Rossetti, PA-C  lidocaine (LIDODERM) 5 % Place 1 patch onto the skin daily. Remove & Discard patch within 12 hours or as directed by MD 03/12/22   Angiulli, Mcarthur Rossetti, PA-C  metoprolol tartrate (LOPRESSOR) 100 MG tablet Take 1 tablet (100 mg total) by mouth 2 (two) times daily. 03/12/22   Angiulli, Mcarthur Rossetti, PA-C  nicotine (NICODERM CQ - DOSED IN MG/24 HOURS) 14 mg/24hr patch 14 mg patch daily x3 weeks then 7 mg patch daily x3 weeks and stop 03/12/22   Angiulli, Mcarthur Rossetti, PA-C  nortriptyline (PAMELOR) 25 MG capsule Take 1 capsule (25 mg total) by mouth at bedtime. 03/12/22   Angiulli, Mcarthur Rossetti, PA-C  oxyCODONE (OXY IR/ROXICODONE)  5 MG immediate release tablet Take 1-2 tablets (5-10 mg total) by mouth every 4 (four) hours as needed for moderate pain or severe pain. 03/12/22   Angiulli, Mcarthur Rossetti, PA-C      Allergies    Sulfa antibiotics and Penicillins    Review of Systems   Review of Systems  Physical Exam Updated Vital Signs Pulse (!) 107   Resp 20   SpO2 97% Comment: [place] @ 2LPM Physical Exam  ED Results / Procedures / Treatments   Labs (all labs ordered are listed, but only abnormal results are displayed) Labs Reviewed  COMPREHENSIVE METABOLIC PANEL  LACTIC ACID, PLASMA  LACTIC ACID, PLASMA  CBC WITH DIFFERENTIAL/PLATELET  URINALYSIS, ROUTINE W REFLEX MICROSCOPIC  RAPID URINE DRUG SCREEN, HOSP PERFORMED  CK  ETHANOL  CBG MONITORING, ED  I-STAT VENOUS BLOOD GAS, ED  CBG MONITORING, ED  TROPONIN I (HIGH SENSITIVITY)    EKG EKG Interpretation  Date/Time:  Monday March 16 2022 21:10:45 EST Ventricular Rate:  104 PR Interval:  150 QRS Duration: 82 QT Interval:  364 QTC Calculation: 479 R Axis:   58 Text Interpretation: Sinus tachycardia Left atrial enlargement Left ventricular hypertrophy ST elev, probable normal early repol pattern Borderline prolonged QT interval Confirmed by Benjiman Core [phone]) on 03/16/2022 9:18:12 PM  Radiology No results found.  Procedures Procedures  {Document cardiac monitor, telemetry assessment procedure when appropriate:1}  Medications  Ordered in ED Medications - No data to display  ED Course/ Medical Decision Making/ A&P                           Medical Decision Making Amount and/or Complexity of Data Reviewed Labs: ordered. Radiology: ordered.   ***  {Document critical care time when appropriate:1} {Document review of labs and clinical decision tools ie heart score, Chads2Vasc2 etc:1}  {Document your independent review of radiology images, and any outside records:1} {Document your discussion with family members, caretakers, and with  consultants:1} {Document social determinants of health affecting pt's care:1} {Document your decision making why or why not admission, treatments were needed:1} Final Clinical Impression(s) / ED Diagnoses Final diagnoses:  None    Rx / DC Orders ED Discharge Orders     None

## 2022-03-16 NOTE — ED Triage Notes (Signed)
BIB [facility]. AMS on scene with friends, combative flailing arms confused. EMS gives versed. Warm to touch. Loss of bowel enroute. Pt remains >93% on [place] @ 2LPM. GCS 3 on arrival.

## 2022-03-16 NOTE — ED Notes (Signed)
This NT assisted Francine Graven with changing the patients linen. The patient was incontinent x2. Suction set up at beside. Bed in lowest patient. Patients necklace, socks, medications and wallets are at bedside in patient belonging bag. RN notified.

## 2022-03-16 NOTE — ED Provider Notes (Incomplete)
[facility] EMERGENCY DEPARTMENT Provider Note   CSN: [id number] Arrival date & time: 03/16/22  2109     History {Add pertinent medical, surgical, social history, OB history to HPI:1} Chief Complaint  Patient presents with   Altered Mental Status    Franklin Anderson is a 51 y.o. male.   Altered Mental Status      Home Medications Prior to Admission medications   Medication Sig Start Date End Date Taking? Authorizing Provider  amLODipine (NORVASC) 10 MG tablet Take 1 tablet (10 mg total) by mouth daily. 03/12/22   Angiulli, Mcarthur Rossetti, PA-C  aspirin 81 MG chewable tablet Chew 1 tablet (81 mg total) by mouth daily. 03/05/22   Standley Brooking, MD  cloNIDine (CATAPRES) 0.2 MG tablet Take 1 tablet (0.2 mg total) by mouth 2 (two) times daily. 03/12/22   Angiulli, Mcarthur Rossetti, PA-C  clopidogrel (PLAVIX) 75 MG tablet Take 1 tablet (75 mg total) by mouth daily. 03/12/22   Angiulli, Mcarthur Rossetti, PA-C  diclofenac Sodium (VOLTAREN) 1 % GEL Apply 4 g topically 4 (four) times daily. 03/12/22   Angiulli, Mcarthur Rossetti, PA-C  gabapentin (NEURONTIN) 300 MG capsule Take 1 capsule (300 mg total) by mouth 3 (three) times daily. 03/12/22   Angiulli, Mcarthur Rossetti, PA-C  leptospermum manuka honey (MEDIHONEY) PSTE paste Apply 1 Application topically daily. 03/12/22   Angiulli, Mcarthur Rossetti, PA-C  lidocaine (LIDODERM) 5 % Place 1 patch onto the skin daily. Remove & Discard patch within 12 hours or as directed by MD 03/12/22   Angiulli, Mcarthur Rossetti, PA-C  metoprolol tartrate (LOPRESSOR) 100 MG tablet Take 1 tablet (100 mg total) by mouth 2 (two) times daily. 03/12/22   Angiulli, Mcarthur Rossetti, PA-C  nicotine (NICODERM CQ - DOSED IN MG/24 HOURS) 14 mg/24hr patch 14 mg patch daily x3 weeks then 7 mg patch daily x3 weeks and stop 03/12/22   Angiulli, Mcarthur Rossetti, PA-C  nortriptyline (PAMELOR) 25 MG capsule Take 1 capsule (25 mg total) by mouth at bedtime. 03/12/22   Angiulli, Mcarthur Rossetti, PA-C  oxyCODONE (OXY IR/ROXICODONE)  5 MG immediate release tablet Take 1-2 tablets (5-10 mg total) by mouth every 4 (four) hours as needed for moderate pain or severe pain. 03/12/22   Angiulli, Mcarthur Rossetti, PA-C      Allergies    Sulfa antibiotics and Penicillins    Review of Systems   Review of Systems  Physical Exam Updated Vital Signs Pulse (!) 107   Resp 20   SpO2 97%  Physical Exam  ED Results / Procedures / Treatments   Labs (all labs ordered are listed, but only abnormal results are displayed) Labs Reviewed - No data to display  EKG None  Radiology No results found.  Procedures Procedures  {Document cardiac monitor, telemetry assessment procedure when appropriate:1}  Medications Ordered in ED Medications - No data to display  ED Course/ Medical Decision Making/ A&P                           Medical Decision Making  ***  {Document critical care time when appropriate:1} {Document review of labs and clinical decision tools ie heart score, Chads2Vasc2 etc:1}  {Document your independent review of radiology images, and any outside records:1} {Document your discussion with family members, caretakers, and with consultants:1} {Document social determinants of health affecting pt's care:1} {Document your decision making why or why not admission, treatments were needed:1} Final Clinical Impression(s) / ED Diagnoses  Final diagnoses:  None    Rx / DC Orders ED Discharge Orders     None

## 2022-03-17 ENCOUNTER — Emergency Department (HOSPITAL_COMMUNITY): Payer: Self-pay

## 2022-03-17 ENCOUNTER — Other Ambulatory Visit (HOSPITAL_COMMUNITY): Payer: Self-pay

## 2022-03-17 ENCOUNTER — Other Ambulatory Visit: Payer: Self-pay

## 2022-03-17 ENCOUNTER — Other Ambulatory Visit: Payer: Self-pay | Admitting: Internal Medicine

## 2022-03-17 ENCOUNTER — Encounter (HOSPITAL_COMMUNITY): Payer: Self-pay | Admitting: Internal Medicine

## 2022-03-17 DIAGNOSIS — F1910 Other psychoactive substance abuse, uncomplicated: Secondary | ICD-10-CM

## 2022-03-17 DIAGNOSIS — T40601A Poisoning by unspecified narcotics, accidental (unintentional), initial encounter: Secondary | ICD-10-CM

## 2022-03-17 DIAGNOSIS — T40601S Poisoning by unspecified narcotics, accidental (unintentional), sequela: Secondary | ICD-10-CM

## 2022-03-17 LAB — TROPONIN I (HIGH SENSITIVITY): Troponin I (High Sensitivity): 25 ng/L — ABNORMAL HIGH (ref <18)

## 2022-03-17 LAB — LACTIC ACID, PLASMA: Lactic Acid, Venous: 1 mmol/L (ref 0.5–1.9)

## 2022-03-17 LAB — URINALYSIS, ROUTINE W REFLEX MICROSCOPIC
Bilirubin Urine: NEGATIVE
Glucose, UA: NEGATIVE mg/dL
Ketones, ur: NEGATIVE mg/dL
Leukocytes,Ua: NEGATIVE
Nitrite: NEGATIVE
Protein, ur: NEGATIVE mg/dL
RBC / HPF: >50 RBC/hpf — ABNORMAL HIGH (ref 0–5)
Specific Gravity, Urine: 1.014 (ref 1.005–1.030)
pH: 5 (ref 5.0–8.0)

## 2022-03-17 LAB — RAPID URINE DRUG SCREEN, HOSP PERFORMED
Amphetamines: NOT DETECTED
Barbiturates: NOT DETECTED
Benzodiazepines: POSITIVE — AB
Cocaine: NOT DETECTED
Opiates: POSITIVE — AB
Tetrahydrocannabinol: NOT DETECTED

## 2022-03-17 MED ORDER — ONDANSETRON HCL 4 MG PO TABS: 4.0000 mg | ORAL_TABLET | Freq: Four times a day (QID) | ORAL | Status: DC | PRN

## 2022-03-17 MED ORDER — ENOXAPARIN SODIUM 40 MG/0.4ML IJ SOSY
40.0000 mg | PREFILLED_SYRINGE | INTRAMUSCULAR | Status: DC
  Administered 2022-03-17: 40 mg via SUBCUTANEOUS
  Filled 2022-03-17: qty 0.4

## 2022-03-17 MED ORDER — NALOXONE HCL 0.4 MG/ML IJ SOLN
INTRAMUSCULAR | Status: AC
  Administered 2022-03-17: 0.4 mg via INTRAVENOUS
  Filled 2022-03-17: qty 1

## 2022-03-17 MED ORDER — ONDANSETRON HCL 4 MG/2ML IJ SOLN: 4.0000 mg | Freq: Four times a day (QID) | INTRAMUSCULAR | Status: DC | PRN

## 2022-03-17 MED ORDER — NALOXONE HCL 0.4 MG/ML IJ SOLN: 0.4000 mg | Freq: Once | INTRAMUSCULAR | Status: AC

## 2022-03-17 MED ORDER — ACETAMINOPHEN 325 MG PO TABS
650.0000 mg | ORAL_TABLET | Freq: Four times a day (QID) | ORAL | Status: DC | PRN
  Filled 2022-03-17: qty 2

## 2022-03-17 MED ORDER — LACTATED RINGERS IV SOLN: INTRAVENOUS | Status: DC

## 2022-03-17 MED ORDER — ACETAMINOPHEN 650 MG RE SUPP: 650.0000 mg | Freq: Four times a day (QID) | RECTAL | Status: DC | PRN

## 2022-03-17 MED ORDER — IOHEXOL 350 MG/ML SOLN
75.0000 mL | Freq: Once | INTRAVENOUS | Status: AC | PRN
  Administered 2022-03-17: 75 mL via INTRAVENOUS

## 2022-03-17 NOTE — ED Notes (Signed)
Per MD Elgergawy patient is ok to be discharged. Will call patient's wife to come pick up patient at this time.

## 2022-03-17 NOTE — ED Notes (Signed)
Patient's brother here to pick patient up and give him a safe ride home. This RN provided patient with paper scrubs prior to leaving department and all belongings were sent home with patient. All discharge instructions including follow up care reviewed with patient and patient verbalized understanding of same. Patient stable at time of discharge.

## 2022-03-17 NOTE — Discharge Instructions (Signed)
Follow with Primary MD Cain Saupe, MD in 7 days   Get CBC, CMP,  checked  by Primary MD next visit.    Activity: As tolerated with Full fall precautions use walker/cane & assistance as needed   Disposition Home    Diet: Heart Healthy   On your next visit with your primary care physician please Get Medicines reviewed and adjusted.   Please request your Prim.MD to go over all Hospital Tests and Procedure/Radiological results at the follow up, please get all Hospital records sent to your Prim MD by signing hospital release before you go home.   If you experience worsening of your admission symptoms, develop shortness of breath, life threatening emergency, suicidal or homicidal thoughts you must seek medical attention immediately by calling 911 or calling your MD immediately  if symptoms less severe.  You Must read complete instructions/literature along with all the possible adverse reactions/side effects for all the Medicines you take and that have been prescribed to you. Take any new Medicines after you have completely understood and accpet all the possible adverse reactions/side effects.   Do not drive, operating heavy machinery, perform activities at heights, swimming or participation in water activities or provide baby sitting services if your were admitted for syncope or siezures until you have seen by Primary MD or a Neurologist and advised to do so again.  Do not drive when taking Pain medications.    Do not take more than prescribed Pain, Sleep and Anxiety Medications  Special Instructions: If you have smoked or chewed Tobacco  in the last 2 yrs please stop smoking, stop any regular Alcohol  and or any Recreational drug use.  Wear Seat belts while driving.   Please note  You were cared for by a hospitalist during your hospital stay. If you have any questions about your discharge medications or the care you received while you were in the hospital after you are discharged,  you can call the unit and asked to speak with the hospitalist on call if the hospitalist that took care of you is not available. Once you are discharged, your primary care physician will handle any further medical issues. Please note that NO REFILLS for any discharge medications will be authorized once you are discharged, as it is imperative that you return to your primary care physician (or establish a relationship with a primary care physician if you do not have one) for your aftercare needs so that they can reassess your need for medications and monitor your lab values.

## 2022-03-17 NOTE — ED Notes (Signed)
Upon attempting to discharge patient, patient started complaining of a severe headache and severe pain in his legs. This RN administered PRN tylenol to patient. When this RN asked patient for his wife's name in order to call her to come pick patient up patient was unable to remember her name. Admitting provider made aware of same.

## 2022-03-17 NOTE — ED Notes (Signed)
Respirations 7/min. This RN counts at bedside to confirm. Does not wake with sternal rub. Pupils 11mm non-reactive. Remains >98% on [place]. Verbal order for narcan. Given immediately. Pt wakes, able to speak, and RR return to 15RR/min.

## 2022-03-17 NOTE — ED Notes (Signed)
Pt awake and confused about event that led up to arriving in the ED. Pt states that he doesn't remember being sent home from rehab several days ago.  Dr. Julian Reil at bedside speaking with pt

## 2022-03-17 NOTE — ED Notes (Signed)
This RN spoke with admitting provider. Per admitting provider patient is ok to be discharged home and is medically cleared at this time. Will update patient of same.

## 2022-03-17 NOTE — Discharge Summary (Signed)
Physician Discharge Summary  Franklin Anderson:[id number] DOB: Nov 23, 1970 DOA: 03/16/2022  PCP: Cain Saupe, MD  Admit date: 03/16/2022 Discharge date: 03/17/2022  Admitted From: (Home) Disposition:  (Home)  Recommendations for Outpatient Follow-up:  Follow up with PCP in 1-2 weeks Please obtain BMP/CBC in one week   Discharge Condition: (Stable)  Diet recommendation: Heart Healthy   Brief/Interim Summary:   Franklin POYER is a 51 y.o. male with medical history significant of Polysubstance abuse, HTN.  Pt with recent admission for severe rhabdomyolysis and lacunar infarct followed by rehab stay.  Just discharged from rehab on 11/17.   Pt presents to ED with AMS.    Per EMS, they were called to scene after the patient became acutely altered at around 1830 this evening.  They were called out because the patient stopped speaking and was flailing his arms around wildly.  On EMSs arrival, the patient was still flailing his arms however, during transport patient became unresponsive with a GCS of 3 after being given versed.  Patient was noted to be tachycardic with heart rate around 110, but otherwise hemodynamically stable.  EMS was unable to obtain a temperature however, they were able to obtain a blood sugar which was within normal limits.   ED course: Pt loaded with keppra given question of seizures.   Pt later noted to be lethargic with RR of 7, given narcan and mental status and RR showed sudden and marked improvement.   He is now AAOx3, denying doing any substances last night, seems genuinely confused how he could have possibly ODd on opiates.  Overdose of opiate or related narcotic (HCC) Toxic encephalopathy Pt in to ED with AMS, lethargy.  Finally had reduced respiratory rate around 1am prompting ED to give him narcan which reversed his mental status.  Much improved after that, awake alert x3, patient adamantly denied taking any illegal substance, but patient was discharged  from CIR 4 days ago on oxycodone, , he was under the impression he can take up to 10 mg every 4 hours. -Back to baseline, patient will be discharged today with recommendation to crease his oxycodone. Discharge Diagnoses:  Principal Problem:   Overdose of opiate or related narcotic (HCC) Active Problems:   Polysubstance abuse [facility])    Discharge Instructions  Discharge Instructions     Diet - low sodium heart healthy   Complete by: As directed    Discharge instructions   Complete by: As directed    Follow with Primary MD Jillyn Hidden, Cammie, MD in 7 days   Get CBC, CMP,  checked  by Primary MD next visit.    Activity: As tolerated with Full fall precautions use walker/cane & assistance as needed   Disposition Home    Diet: Heart Healthy   On your next visit with your primary care physician please Get Medicines reviewed and adjusted.   Please request your Prim.MD to go over all Hospital Tests and Procedure/Radiological results at the follow up, please get all Hospital records sent to your Prim MD by signing hospital release before you go home.   If you experience worsening of your admission symptoms, develop shortness of breath, life threatening emergency, suicidal or homicidal thoughts you must seek medical attention immediately by calling 911 or calling your MD immediately  if symptoms less severe.  You Must read complete instructions/literature along with all the possible adverse reactions/side effects for all the Medicines you take and that have been prescribed to you. Take any new Medicines after  you have completely understood and accpet all the possible adverse reactions/side effects.   Do not drive, operating heavy machinery, perform activities at heights, swimming or participation in water activities or provide baby sitting services if your were admitted for syncope or siezures until you have seen by Primary MD or a Neurologist and advised to do so again.  Do not drive when  taking Pain medications.    Do not take more than prescribed Pain, Sleep and Anxiety Medications  Special Instructions: If you have smoked or chewed Tobacco  in the last 2 yrs please stop smoking, stop any regular Alcohol  and or any Recreational drug use.  Wear Seat belts while driving.   Please note  You were cared for by a hospitalist during your hospital stay. If you have any questions about your discharge medications or the care you received while you were in the hospital after you are discharged, you can call the unit and asked to speak with the hospitalist on call if the hospitalist that took care of you is not available. Once you are discharged, your primary care physician will handle any further medical issues. Please note that NO REFILLS for any discharge medications will be authorized once you are discharged, as it is imperative that you return to your primary care physician (or establish a relationship with a primary care physician if you do not have one) for your aftercare needs so that they can reassess your need for medications and monitor your lab values.   Increase activity slowly   Complete by: As directed       Allergies as of 03/17/2022       Reactions   Sulfa Antibiotics Anaphylaxis   Penicillins Other (See Comments)   "Near death" reaction per pt 03-20-2022, could not elaborate.        Medication List     STOP taking these medications    oxyCODONE 5 MG immediate release tablet Commonly known as: Oxy IR/ROXICODONE       TAKE these medications    amLODipine 10 MG tablet Commonly known as: NORVASC Take 1 tablet (10 mg total) by mouth daily.   aspirin 81 MG chewable tablet Chew 1 tablet (81 mg total) by mouth daily.   cloNIDine 0.2 MG tablet Commonly known as: CATAPRES Take 1 tablet (0.2 mg total) by mouth 2 (two) times daily.   clopidogrel 75 MG tablet Commonly known as: PLAVIX Take 1 tablet (75 mg total) by mouth daily.   diclofenac Sodium 1 %  Gel Commonly known as: VOLTAREN Apply 4 g topically 4 (four) times daily.   gabapentin 300 MG capsule Commonly known as: NEURONTIN Take 1 capsule (300 mg total) by mouth 3 (three) times daily.   leptospermum manuka honey Pste paste Apply 1 Application topically daily.   lidocaine 5 % Commonly known as: LIDODERM Place 1 patch onto the skin daily. Remove & Discard patch within 12 hours or as directed by MD   metoprolol tartrate 100 MG tablet Commonly known as: LOPRESSOR Take 1 tablet (100 mg total) by mouth 2 (two) times daily.   nicotine 14 mg/24hr patch Commonly known as: NICODERM CQ - dosed in mg/24 hours 14 mg patch daily x3 weeks then 7 mg patch daily x3 weeks and stop   nortriptyline 25 MG capsule Commonly known as: PAMELOR Take 1 capsule (25 mg total) by mouth at bedtime.        Allergies  Allergen Reactions   Sulfa Antibiotics Anaphylaxis   Penicillins  Other (See Comments)    "Near death" reaction per pt 03-01-22, could not elaborate.    Consultations: none   Procedures/Studies: CT Angio Head Neck W WO CM  Result Date: 03/17/2022 CLINICAL DATA:  Altered mental status EXAM: CT ANGIOGRAPHY HEAD AND NECK TECHNIQUE: Multidetector CT imaging of the head and neck was performed using the standard protocol during bolus administration of intravenous contrast. Multiplanar CT image reconstructions and MIPs were obtained to evaluate the vascular anatomy. Carotid stenosis measurements (when applicable) are obtained utilizing NASCET criteria, using the distal internal carotid diameter as the denominator. RADIATION DOSE REDUCTION: This exam was performed according to the departmental dose-optimization program which includes automated exposure control, adjustment of the mA and/or kV according to patient size and/or use of iterative reconstruction technique. CONTRAST:  75mL OMNIPAQUE IOHEXOL 350 MG/ML SOLN COMPARISON:  No prior CTA, correlation is made with MRA head 02/25/2022 and  CT head 03/16/2022 FINDINGS: CT HEAD FINDINGS For noncontrast findings, please see same day CT head. CTA NECK FINDINGS Evaluation is somewhat limited by motion artifact. Aortic arch: Standard branching. Imaged portion shows no evidence of aneurysm or dissection. No significant stenosis of the major arch vessel origins. Right carotid system: No evidence of dissection, occlusion, or hemodynamically significant stenosis (greater than 50%). Left carotid system: No evidence of dissection, occlusion, or hemodynamically significant stenosis (greater than 50%). Vertebral arteries: Evaluation of the proximal vertebral arteries bilaterally is somewhat limited by beam hardening artifact from the contrast bolus. Within this limitation, no evidence of dissection, occlusion, or hemodynamically significant stenosis (greater than 50%). Skeleton: No acute osseous abnormality. Likely degenerative fusion of C6 and C7. Other neck: Negative. Upper chest: Several small pulmonary nodules the largest, which measures up to 5 mm. No pleural effusion. Emphysema. Review of the MIP images confirms the above findings CTA HEAD FINDINGS Anterior circulation: Both internal carotid arteries are patent to the termini, without significant stenosis. A1 segments patent. Normal anterior communicating artery. Anterior cerebral arteries are patent to their distal aspects. No M1 stenosis or occlusion. MCA branches perfused and symmetric. Posterior circulation: Vertebral arteries patent to the vertebrobasilar junction without stenosis. Posterior inferior cerebellar arteries patent proximally. Basilar patent to its distal aspect. Superior cerebellar arteries patent proximally. Patent P1 segments, diminutive on the left. Near fetal origin of the left PCA from a prominent left posterior communicating artery. PCAs perfused to their distal aspects without stenosis. The right posterior communicating artery is not visualized. Venous sinuses: As permitted by  contrast timing, patent. Anatomic variants: None significant. Review of the MIP images confirms the above findings IMPRESSION: 1. No intracranial large vessel occlusion or significant stenosis. 2. No hemodynamically significant stenosis in the neck. 3. Several small pulmonary nodules, the largest of which measures up to 5 mm. No follow-up needed if patient is low-risk (and has no known or suspected primary neoplasm). Non-contrast chest CT can be considered in 12 months if patient is high-risk. This recommendation follows the consensus statement: Guidelines for Management of Incidental Pulmonary Nodules Detected on CT Images: From the Fleischner Society 2017; Radiology 2017; [id number]. 2017; 782:956-213. 4. Emphysema. Emphysema (ICD10-J43.9). Electronically Signed   By: Wiliam Ke M.D.   On: 03/17/2022 02:37   CT Head Wo Contrast  Result Date: 03/16/2022 CLINICAL DATA:  Mental status change, unknown cause EXAM: CT HEAD WITHOUT CONTRAST TECHNIQUE: Contiguous axial images were obtained from the base of the skull through the vertex without intravenous contrast. RADIATION DOSE REDUCTION: This exam was performed according to the departmental dose-optimization program which  includes automated exposure control, adjustment of the mA and/or kV according to patient size and/or use of iterative reconstruction technique. COMPARISON:  Head CT 02/23/2022, brain MRI 02/24/2022 FINDINGS: Brain: No intracranial hemorrhage, mass effect, or midline shift. No hydrocephalus. The basilar cisterns are patent. Faint area of decreased attenuation at site of recent acute lacunar infarct in the right corona radiata. No definite CT correlate of the recent left cerebellar infarct. Small remote right cerebellar infarct. No evidence of territorial infarct or acute ischemia. No extra-axial or intracranial fluid collection. Vascular: No hyperdense vessel or unexpected calcification. Skull: No fracture or focal lesion. Sinuses/Orbits:  Paranasal sinuses and mastoid air cells are clear. The visualized orbits are unremarkable. Other: None. IMPRESSION: 1. No acute intracranial abnormality. 2. Faint area of decreased attenuation at site of recent acute lacunar infarct in the right corona radiata. No definite CT correlate of the recent left cerebellar infarct. Small remote right cerebellar infarct. Electronically Signed   By: Narda Rutherford M.D.   On: 03/16/2022 22:26   DG Chest Portable 1 View  Result Date: 03/16/2022 CLINICAL DATA:  Altered mental status EXAM: PORTABLE CHEST 1 VIEW COMPARISON:  Chest radiograph dated 02/24/2019 FINDINGS: Normal lung volumes. Patchy bilateral mid and left lower lung opacities. No pleural effusion or pneumothorax. The heart size and mediastinal contours are within normal limits. The visualized skeletal structures are unremarkable. IMPRESSION: Patchy bilateral mid and left lower lung opacities, which may represent multifocal aspiration or pneumonia. Electronically Signed   By: Agustin Cree M.D.   On: 03/16/2022 21:43   ECHO TEE  Result Date: 03/04/2022    TRANSESOPHOGEAL ECHO REPORT   Patient Name:   CHRSTOPHER MALENFANT Date of Exam: 03/04/2022 Medical Rec #:  [id number]      Height:       72.0 in Accession #:    [id number]     Weight:       162.9 lb Date of Birth:  [birth year]     BSA:          1.953 m Patient Age:    50 years       BP:           149/105 mmHg Patient Gender: M              HR:           85 bpm. Exam Location:  Inpatient Procedure: Transesophageal Echo, Color Doppler and Cardiac Doppler Indications:     Stroke i63.9  History:         Patient has prior history of Echocardiogram examinations, most                  recent 02/25/2022. Risk Factors:Hypertension and Polysubstance                  Abuse.  Sonographer:     Irving Burton Senior RDCS Referring Phys:  Nada Boozer, R Diagnosing Phys: Thomasene Ripple DO PROCEDURE: After discussion of the risks and benefits of a TEE, an informed consent was obtained from the  patient. The transesophogeal probe was passed without difficulty through the esophogus of the patient. Sedation performed by different physician. The patient was monitored while under deep sedation. Anesthestetic sedation was provided intravenously by Anesthesiology:  of Propofol,  of Lidocaine. The patient's vital signs; including heart rate, blood pressure, and oxygen saturation; remained stable throughout the procedure. The patient developed no complications during the procedure.  IMPRESSIONS  1. Left ventricular ejection fraction, by estimation, is 55  to 60%. The left ventricle has normal function. The left ventricle has no regional wall motion abnormalities.  2. Right ventricular systolic function is normal. The right ventricular size is normal.  3. No left atrial/left atrial appendage thrombus was detected. The LAA emptying velocity was 96 cm/s.  4. The mitral valve is normal in structure. No evidence of mitral valve regurgitation. No evidence of mitral stenosis.  5. The aortic valve is normal in structure. Aortic valve regurgitation is trivial. No aortic stenosis is present.  6. There is Moderate (Grade III) layered and protruding plaque involving the descending aorta and aortic arch.  7. The inferior vena cava is normal in size with greater than 50% respiratory variability, suggesting right atrial pressure of 3 mmHg.  8. Agitated saline contrast bubble study was negative, with no evidence of any interatrial shunt. Conclusion(s)/Recommendation(s): Normal biventricular function without evidence of hemodynamically significant valvular heart disease. FINDINGS  Left Ventricle: Left ventricular ejection fraction, by estimation, is 55 to 60%. The left ventricle has normal function. The left ventricle has no regional wall motion abnormalities. The left ventricular internal cavity size was normal in size. There is  no left ventricular hypertrophy. Right Ventricle: The right ventricular size is normal. No  increase in right ventricular wall thickness. Right ventricular systolic function is normal. Left Atrium: Left atrial size was normal in size. No left atrial/left atrial appendage thrombus was detected. The LAA emptying velocity was 96 cm/s. Right Atrium: Right atrial size was normal in size. Pericardium: There is no evidence of pericardial effusion. Mitral Valve: The mitral valve is normal in structure. No evidence of mitral valve regurgitation. No evidence of mitral valve stenosis. Tricuspid Valve: The tricuspid valve is normal in structure. Tricuspid valve regurgitation is trivial. No evidence of tricuspid stenosis. Aortic Valve: The aortic valve is normal in structure. Aortic valve regurgitation is trivial. No aortic stenosis is present. Pulmonic Valve: The pulmonic valve was normal in structure. Pulmonic valve regurgitation is not visualized. No evidence of pulmonic stenosis. Aorta: The aortic root is normal in size and structure. There is moderate (Grade III) layered and protruding plaque involving the descending aorta and aortic arch. Venous: The left upper pulmonary vein, left lower pulmonary vein, right upper pulmonary vein and right lower pulmonary vein are normal. A normal flow pattern is recorded from the left upper pulmonary vein. The inferior vena cava is normal in size with greater than 50% respiratory variability, suggesting right atrial pressure of 3 mmHg. IAS/Shunts: No atrial level shunt detected by color flow Doppler. Agitated saline contrast bubble study was negative, with no evidence of any interatrial shunt. Additional Comments: Spectral Doppler performed. Thomasene Ripple DO Electronically signed by Thomasene Ripple DO Signature Date/Time: 03/04/2022/2:09:33 PM    Final    VAS US CAROTID  Result Date: 02/26/2022 Carotid Arterial Duplex Study Patient Name:  SHAYLON ADEN  Date of Exam:   02/25/2022 Medical Rec #: [id number]       Accession #:    [id number] Date of Birth: November 18, 1970      Patient  Gender: M Patient Age:   62 years Exam Location[facility]  [facility] Procedure:      VAS US CAROTID Referring Phys: Scheryl Marten XU --------------------------------------------------------------------------------  Indications:       CVA. Risk Factors:      Hypertension. Comparison Study:  no prior Performing Technologist: [place] Ponder RVS  Examination Guidelines: A complete evaluation includes B-mode imaging, spectral Doppler, color Doppler, and power Doppler as needed of all accessible  portions of each vessel. Bilateral testing is considered an integral part of a complete examination. Limited examinations for reoccurring indications may be performed as noted.  Right Carotid Findings: +----------+--------+--------+--------+------------------+--------+           PSV cm/sEDV cm/sStenosisPlaque DescriptionComments +----------+--------+--------+--------+------------------+--------+ CCA Prox  90      29              heterogenous               +----------+--------+--------+--------+------------------+--------+ CCA Distal57      24              heterogenous               +----------+--------+--------+--------+------------------+--------+ ICA Prox  53      21              heterogenous               +----------+--------+--------+--------+------------------+--------+ ICA Distal71      30                                         +----------+--------+--------+--------+------------------+--------+ ECA       81      27                                         +----------+--------+--------+--------+------------------+--------+ +----------+--------+-------+--------+-------------------+           PSV cm/sEDV cmsDescribeArm Pressure (mmHG) +----------+--------+-------+--------+-------------------+ Subclavian89                                         +----------+--------+-------+--------+-------------------+ +---------+--------+--+--------+--+---------+ VertebralPSV cm/s41EDV  cm/s18Antegrade +---------+--------+--+--------+--+---------+  Left Carotid Findings: +----------+--------+--------+--------+------------------+--------+           PSV cm/sEDV cm/sStenosisPlaque DescriptionComments +----------+--------+--------+--------+------------------+--------+ CCA Prox  93      29              heterogenous               +----------+--------+--------+--------+------------------+--------+ CCA Distal73      27              heterogenous               +----------+--------+--------+--------+------------------+--------+ ICA Prox  64      35              heterogenous               +----------+--------+--------+--------+------------------+--------+ ICA Distal83      40                                         +----------+--------+--------+--------+------------------+--------+ ECA       91      23                                         +----------+--------+--------+--------+------------------+--------+ +----------+--------+--------+--------+-------------------+           PSV cm/sEDV cm/sDescribeArm Pressure (mmHG) +----------+--------+--------+--------+-------------------+ [id number]                                          +----------+--------+--------+--------+-------------------+ +---------+--------+--+--------+--+---------+  VertebralPSV cm/s35EDV cm/s10Antegrade +---------+--------+--+--------+--+---------+   Summary: Right Carotid: The extracranial vessels were near-normal with only minimal wall                thickening or plaque. Left Carotid: The extracranial vessels were near-normal with only minimal wall               thickening or plaque. Vertebrals: Bilateral vertebral arteries demonstrate antegrade flow. *See table(s) above for measurements and observations.  Electronically signed by Delia HeadyPramod Sethi MD on 02/26/2022 at 8:04:03 AM.    Final    ECHOCARDIOGRAM COMPLETE  Result Date: 02/25/2022    ECHOCARDIOGRAM REPORT   Patient  Name:   Cornell BarmanUL R Nass Date of Exam: 02/25/2022 Medical Rec #:  [id number]      Height:       72.0 in Accession #:    [id number]     Weight:       125.0 lb Date of Birth:  1970/09/17     BSA:          1.745 m Patient Age:    50 years       BP:           174/116 mmHg Patient Gender: M              HR:           83 bpm. Exam Location:  Inpatient Procedure: 2D Echo, Color Doppler and Cardiac Doppler Indications:    Stroke  History:        Patient has no prior history of Echocardiogram examinations.                 Signs/Symptoms:Murmur; Risk Factors:Hypertension, Family History                 of Coronary Artery Disease and Current Smoker. Polysubstance                 abuse, rheumatic fever (as a child).  Sonographer:    Milda SmartShannon O'Grady Referring Phys: [id number] JINDONG XU  Sonographer Comments: Image acquisition challenging due to patient body habitus and Image acquisition challenging due to respiratory motion. IMPRESSIONS  1. Left ventricular ejection fraction, by estimation, is 55%. The left ventricle has normal function. The left ventricle has no regional wall motion abnormalities. There is mild concentric left ventricular hypertrophy. Left ventricular diastolic parameters are consistent with Grade I diastolic dysfunction (impaired relaxation).  2. Right ventricular systolic function is normal. The right ventricular size is normal. Tricuspid regurgitation signal is inadequate for assessing PA pressure.  3. The mitral valve is normal in structure. Trivial mitral valve regurgitation. No evidence of mitral stenosis.  4. The aortic valve is tricuspid. Aortic valve regurgitation is trivial. No aortic stenosis is present.  5. The inferior vena cava is normal in size with greater than 50% respiratory variability, suggesting right atrial pressure of 3 mmHg. FINDINGS  Left Ventricle: Left ventricular ejection fraction, by estimation, is 55%. The left ventricle has normal function. The left ventricle has no regional wall  motion abnormalities. The left ventricular internal cavity size was normal in size. There is mild concentric left ventricular hypertrophy. Left ventricular diastolic parameters are consistent with Grade I diastolic dysfunction (impaired relaxation). Right Ventricle: The right ventricular size is normal. No increase in right ventricular wall thickness. Right ventricular systolic function is normal. Tricuspid regurgitation signal is inadequate for assessing PA pressure. Left Atrium: Left atrial size was normal in size. Right Atrium: Right atrial size was normal in  size. Pericardium: Trivial pericardial effusion is present. Mitral Valve: The mitral valve is normal in structure. Trivial mitral valve regurgitation. No evidence of mitral valve stenosis. MV peak gradient, 4.0 mmHg. The mean mitral valve gradient is 2.0 mmHg. Tricuspid Valve: The tricuspid valve is normal in structure. Tricuspid valve regurgitation is not demonstrated. Aortic Valve: The aortic valve is tricuspid. Aortic valve regurgitation is trivial. No aortic stenosis is present. Pulmonic Valve: The pulmonic valve was normal in structure. Pulmonic valve regurgitation is not visualized. Aorta: The aortic root is normal in size and structure. Venous: The inferior vena cava is normal in size with greater than 50% respiratory variability, suggesting right atrial pressure of 3 mmHg. IAS/Shunts: No atrial level shunt detected by color flow Doppler.  LEFT VENTRICLE PLAX 2D LVIDd:         4.50 cm      Diastology LVIDs:         4.00 cm      LV e' medial:    8.55 cm/s LV PW:         1.20 cm      LV E/e' medial:  7.8 LV IVS:        0.90 cm      LV e' lateral:   8.86 cm/s LVOT diam:     2.10 cm      LV E/e' lateral: 7.5 LV SV:         67 LV SV Index:   38 LVOT Area:     3.46 cm  LV Volumes (MOD) LV vol d, MOD A2C: 93.4 ml LV vol d, MOD A4C: 149.0 ml LV vol s, MOD A2C: 52.3 ml LV vol s, MOD A4C: 84.5 ml LV SV MOD A2C:     41.1 ml LV SV MOD A4C:     149.0 ml LV SV MOD  BP:      48.2 ml RIGHT VENTRICLE             IVC RV S prime:     15.50 cm/s  IVC diam: 2.00 cm TAPSE (M-mode): 1.8 cm LEFT ATRIUM             Index        RIGHT ATRIUM           Index LA diam:        2.90 cm 1.66 cm/m   RA Area:     13.90 cm LA Vol (A2C):   63.8 ml 36.57 ml/m  RA Volume:   34.50 ml  19.77 ml/m LA Vol (A4C):   28.5 ml 16.33 ml/m LA Biplane Vol: 43.1 ml 24.70 ml/m  AORTIC VALVE LVOT Vmax:   132.00 cm/s LVOT Vmean:  87.200 cm/s LVOT VTI:    0.193 m  AORTA Ao Root diam: 2.90 cm Ao Asc diam:  3.20 cm MITRAL VALVE MV Area (PHT): 3.06 cm    SHUNTS MV Area VTI:   2.88 cm    Systemic VTI:  0.19 m MV Peak grad:  4.0 mmHg    Systemic Diam: 2.10 cm MV Mean grad:  2.0 mmHg MV Vmax:       1.00 m/s MV Vmean:      64.2 cm/s MV Decel Time: 248 msec MV E velocity: 66.70 cm/s MV A velocity: 68.90 cm/s MV E/A ratio:  0.97 Dalton McleanMD Electronically signed by Wilfred Lacy Signature Date/Time: 02/25/2022/3:04:29 PM    Final    VAS [place] LOWER EXTREMITY VENOUS (DVT)  Result Date: 02/25/2022  Lower Venous  DVT Study Patient Name:  SANTHIAGO COLLINGSWORTH  Date of Exam:   02/25/2022 Medical Rec #: [id number]       Accession #:    [id number] Date of Birth: 01-17-1971      Patient Gender: M Patient Age:   75 years Exam Location:  [facility] Procedure:      VAS [place] LOWER EXTREMITY VENOUS (DVT) Referring Phys: Scheryl Marten XU --------------------------------------------------------------------------------  Indications: Stroke.  Comparison Study: prior 03/01/19 Performing Technologist: [place] Ponder RVS  Examination Guidelines: A complete evaluation includes B-mode imaging, spectral Doppler, color Doppler, and power Doppler as needed of all accessible portions of each vessel. Bilateral testing is considered an integral part of a complete examination. Limited examinations for reoccurring indications may be performed as noted. The reflux portion of the exam is performed with the patient in reverse Trendelenburg.   +--------+---------------+---------+-----------+----------+--------------------+ RIGHT   CompressibilityPhasicitySpontaneityPropertiesThrombus Aging       +--------+---------------+---------+-----------+----------+--------------------+ CFV     Full           Yes      Yes                                       +--------+---------------+---------+-----------+----------+--------------------+ SFJ     Full                                                              +--------+---------------+---------+-----------+----------+--------------------+ FV Prox Full                                                              +--------+---------------+---------+-----------+----------+--------------------+ FV Mid  Full           Yes      Yes                                       +--------+---------------+---------+-----------+----------+--------------------+ FV      Full                                                              Distal                                                                    +--------+---------------+---------+-----------+----------+--------------------+ PFV     Full                                                              +--------+---------------+---------+-----------+----------+--------------------+  POP     Full           Yes      Yes                                       +--------+---------------+---------+-----------+----------+--------------------+ PTV     Full           Yes      Yes                  patent by color                                                           doppler              +--------+---------------+---------+-----------+----------+--------------------+ PERO    Full           Yes      Yes                  patent by color                                                           doppler              +--------+---------------+---------+-----------+----------+--------------------+    +---------+---------------+---------+-----------+----------+--------------+ LEFT     CompressibilityPhasicitySpontaneityPropertiesThrombus Aging +---------+---------------+---------+-----------+----------+--------------+ CFV      Full           Yes      Yes                                 +---------+---------------+---------+-----------+----------+--------------+ SFJ      Full                                                        +---------+---------------+---------+-----------+----------+--------------+ FV Prox  Full                                                        +---------+---------------+---------+-----------+----------+--------------+ FV Mid   Full                                                        +---------+---------------+---------+-----------+----------+--------------+ FV DistalFull                                                        +---------+---------------+---------+-----------+----------+--------------+  PFV      Full                                                        +---------+---------------+---------+-----------+----------+--------------+ POP      Full           Yes      Yes                                 +---------+---------------+---------+-----------+----------+--------------+ PTV      Full                                                        +---------+---------------+---------+-----------+----------+--------------+ PERO     Full                                                        +---------+---------------+---------+-----------+----------+--------------+     Summary: BILATERAL: - No evidence of deep vein thrombosis seen in the lower extremities, bilaterally. -No evidence of popliteal cyst, bilaterally.   *See table(s) above for measurements and observations. Electronically signed by Sherald Hess MD on 02/25/2022 at 12:07:47 PM.    Final    MR ANGIO HEAD WO CONTRAST  Result Date:  02/25/2022 CLINICAL DATA:  Stroke, follow up EXAM: MRA HEAD WITHOUT CONTRAST TECHNIQUE: Angiographic images of the Circle of Willis were acquired using MRA technique without intravenous contrast. COMPARISON:  MRI head February 24, 2022. FINDINGS: Anterior circulation: Bilateral intracranial ICAs, MCAs, and ACAs are patent without proximal hemodynamically significant stenosis. Posterior circulation: Bilateral intradural vertebral arteries, basilar artery and bilateral posterior cerebral arteries are patent without proximal hemodynamically significant stenosis. Left fetal type PCA, anatomic variant. IMPRESSION: No emergent large vessel occlusion or proximal hemodynamically significant stenosis. Electronically Signed   By: Feliberto Harts M.D.   On: 02/25/2022 09:01   MR BRAIN WO CONTRAST  Result Date: 02/24/2022 CLINICAL DATA:  Altered mental status.  Found down. EXAM: MRI HEAD WITHOUT CONTRAST TECHNIQUE: Multiplanar, multiecho pulse sequences of the brain and surrounding structures were obtained without intravenous contrast. 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 No evidence of hemorrhage. No extra-axial fluid collection. No hydrocephalus. 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 Vascular: Normal flow voids. Skull and upper cervical spine: Normal marrow signal. Sinuses/Orbits: Negative. Other: There is a soft tissue hematoma along the midline frontal scalp. IMPRESSION: 1. 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 No hemorrhage or mass effect. 2. Soft tissue hematoma along the midline frontal scalp. Electronically Signed   By: Lorenza Cambridge M.D.   On: 02/24/2022 10:10   CT EXTREMITY LOWER RIGHT WO CONTRAST  Addendum Date: 02/24/2022   ADDENDUM REPORT: 02/24/2022 03:59 ADDENDUM:  Discussed over the phone in detail with  Dr. Maisie Fus at 3:51 a.m., 02/24/2022, with verbal acknowledgement of the key findings. Electronically Signed   By: Almira Bar M.D.   On: 02/24/2022 03:59   Result Date: 02/24/2022 CLINICAL DATA:  Severe rhabdomyolysis. Evaluate for compartment syndrome. EXAM: CT OF THE LOWER RIGHT EXTREMITY WITHOUT CONTRAST TECHNIQUE: Multidetector CT imaging of the entire right lower extremity was performed according to the standard protocol. RADIATION DOSE REDUCTION: This exam was performed according to the departmental dose-optimization program which includes automated exposure control, adjustment of the mA and/or kV according to patient size and/or use of iterative reconstruction technique. COMPARISON:  Limited comparison is available with a right hip CT from 03/31/2019. FINDINGS: Bones/Joint/Cartilage Normal bone mineralization. There is no evidence of fractures or aggressive bone lesions and no findings of right femoral head avascular necrosis. 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 No significant ankle or foot arthropathy is seen. No evidence of joint effusions or hemarthrosis. There is a small accessory navicular bone incidentally noted as well as a tiny os peroneum alongside the cuboid bone. Ligaments Suboptimally assessed by CT. Muscles and Tendons There is diffuse edema and swelling of the right gluteus medius muscle, the dorsal hip adductors in the proximal thigh and the distal aspect of the vastus medialis. The foreleg and plantar foot musculature are unremarkable without contrast. Area tendons are grossly intact, as visualized. Soft tissues No mass, soft tissue gas or localizing fluid collection is seen. There is scattered nonlocalizing fluid in the dorsal proximal thigh between the muscle groups. There is mild superficial edema in the thigh. IMPRESSION: 1. Diffuse edema and swelling of the right  gluteus medius muscle, the dorsal hip adductors in the proximal thigh, and the distal aspect of the vastus medialis. 2. 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 3. No evidence of fractures or aggressive bone lesions. 4. Mild nonerosive degenerative changes of the right hip and lateral patellofemoral joint. Electronically Signed: By: Almira Bar M.D. On: 02/24/2022 02:26   CT Extrem Up Entire Arm R WO/CM  Addendum Date: 02/24/2022   ADDENDUM REPORT: 02/24/2022 02:27 ADDENDUM: Not mentioned above, there are coarse linear scar-like opacities in the right lung apex and right lower lung field. There is a 4 mm chronic nodule in right lower lung field which was seen on a 2008 abdomen pelvis CT and is unchanged. Electronically Signed   By: Almira Bar M.D.   On: 02/24/2022 02:27   Result Date: 02/24/2022 CLINICAL DATA:  Severe rhabdomyolysis. Evaluate for compartment syndrome findings. EXAM: CT OF THE UPPER RIGHT EXTREMITY WITHOUT CONTRAST TECHNIQUE: Multidetector CT imaging of the upper right extremity was performed according to the standard protocol. RADIATION DOSE REDUCTION: This exam was performed according to the departmental dose-optimization program which includes automated exposure control, adjustment of the mA and/or kV according to patient size and/or use of iterative reconstruction technique. COMPARISON:  None Available. FINDINGS: Bones/Joint/Cartilage There is normal bone mineralization without acute fractures. There is a cortical depression in the posterosuperior right humeral head suggesting an age-indeterminate Hill-Sachs impaction deformity such as due to an old anterior shoulder dislocation. 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 There is no evidence of joint effusions or hemarthrosis. No destructive bone lesion or  aggressive periostitis is seen in the right upper extremity. Ligaments Suboptimally assessed by CT. Muscles  and Tendons There are considerable beam hardening artifacts through the upper extremity due to the study having been performed with his arm by his side. This limits fine detail regarding the muscles and tendons. There is a grossly normal muscle bulk for the patient's age. Whether or not there are tendon abnormalities is indeterminate. Soft tissues No soft tissue gas or fluid collections are identified. IMPRESSION: 1. No soft tissue gas or focal fluid collections are identified. Absence of these findings however, should not delay surgical exploration if there is strong clinical concern for compartment syndrome with necrotizing fasciitis. 2. Cortical depression in the posterosuperior right humeral head suggesting an age-indeterminate Hill-Sachs impaction deformity such as due to an old anterior shoulder dislocation. 3. 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 No evidence of joint effusions or hemarthrosis. 4. No destructive bone lesion or aggressive periostitis in the right upper extremity. 5. Significant beam hardening artifacts through the upper extremity due to technique. This limits fine detail regarding the muscles and tendons. Electronically Signed: By: Almira Bar M.D. On: 02/24/2022 02:03   DG Pelvis 1-2 Views  Result Date: 02/23/2022 CLINICAL DATA:  Fall. EXAM: PELVIS - 1-2 VIEW COMPARISON:  None Available. FINDINGS: There is no evidence of pelvic fracture or diastasis. No pelvic bone lesions are seen. IMPRESSION: Negative. Electronically Signed   By: Darliss Cheney M.D.   On: 02/23/2022 21:41   DG Chest Portable 1 View  Result Date: 02/23/2022 CLINICAL DATA:  Fall EXAM: PORTABLE CHEST 1 VIEW COMPARISON:  02/06/2022 FINDINGS: Heart and mediastinal contours are within normal limits. No focal opacities or effusions. No acute bony abnormality.  IMPRESSION: No active disease. Electronically Signed   By: Charlett Nose M.D.   On: 02/23/2022 21:11   CT HEAD CODE STROKE WO CONTRAST  Result Date: 02/23/2022 CLINICAL DATA:  Code stroke. Initial evaluation for stroke, right-sided deficits. EXAM: CT HEAD WITHOUT CONTRAST TECHNIQUE: Contiguous axial images were obtained from the base of the skull through the vertex without intravenous contrast. RADIATION DOSE REDUCTION: This exam was performed according to the departmental dose-optimization program which includes automated exposure control, adjustment of the mA and/or kV according to patient size and/or use of iterative reconstruction technique. COMPARISON:  Prior CT from 02/22/2019. FINDINGS: Brain: Cerebral volume within normal limits for age. 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 No acute large vessel territory infarct. No acute intracranial hemorrhage. No mass lesion or midline shift. No hydrocephalus or extra-axial fluid collection. Vascular: No asymmetric hyperdense vessel. Skull: Focal soft tissue thickening/swelling present at the central forehead, indeterminate (series 5, image 22). Calvarium intact. Sinuses/Orbits: Globes orbital soft tissues within normal limits. Paranasal sinuses and mastoid air cells are largely clear. Other: None. ASPECTS [facility] Stroke Program Early CT Score) - Ganglionic level infarction (caudate, lentiform nuclei, internal capsule, insula, M1-M3 cortex): 7 - Supraganglionic infarction (M4-M6 cortex): 3 Total score (0-10 with 10 being normal): 10 IMPRESSION: 1. No acute intracranial abnormality. 2. ASPECTS is 10. 3. 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 4. Focal soft tissue thickening and/or swelling at the central forehead. Correlation with physical exam recommended. These results were communicated to Dr. Wilford Corner at 8:42 pm on 02/23/2022 by text page  via the [facility] messaging system. Electronically Signed   By: Rise Mu M.D.   On: 02/23/2022 20:45      Subjective: Significant events overnight, he  denies any complaints.  Discharge Exam: Vitals:   03/17/22 1240 03/17/22 1400  BP:  (!) 159/97  Pulse: 96 97  Resp:  15  Temp:  98.2 F (36.8 C)  SpO2: 98% 98%   Vitals:   03/17/22 1151 03/17/22 1235 03/17/22 1240 03/17/22 1400  BP: (!) 148/96 (!) 174/91  (!) 159/97  Pulse:  99 96 97  Resp: 16   15  Temp:    98.2 F (36.8 C)  TempSrc:      SpO2:  99% 98% 98%  Weight:      Height:        General: Pt is alert, awake, not in acute distress Cardiovascular: RRR, S1/S2 +, no rubs, no gallops Respiratory: CTA bilaterally, no wheezing, no rhonchi Abdominal: Soft, NT, ND, bowel sounds + Extremities: no edema, no cyanosis    The results of significant diagnostics from this hospitalization (including imaging, microbiology, ancillary and laboratory) are listed below for reference.     Microbiology: No results found for this or any previous visit (from the past 240 hour(s)).   Labs: BNP (last 3 results) Recent Labs    01/15/22 2122  BNP 133.0*   Basic Metabolic Panel: Recent Labs  Lab 03/12/22 0528 03/16/22 2110 03/16/22 2214  NA 139 140 141  K 4.1 4.0 3.9  CL 103 103  --   CO2 25 24  --   GLUCOSE 113* 101*  --   BUN 33* 24*  --   CREATININE 1.20 1.15  --   CALCIUM 9.6 9.5  --    Liver Function Tests: Recent Labs  Lab 03/12/22 0528 03/16/22 2110  AST 22 32  ALT 42 36  ALKPHOS 57 65  BILITOT 0.3 0.3  PROT 6.4* 7.3  ALBUMIN 3.2* 3.9   No results for input(s): "LIPASE", "AMYLASE" in the last 168 hours. No results for input(s): "AMMONIA" in the last 168 hours. CBC: Recent Labs  Lab 03/16/22 2110 03/16/22 2214  WBC 10.2  --   NEUTROABS 8.8*  --   HGB 11.8* 11.9*  HCT 35.4* 35.0*  MCV 98.6  --   PLT 511*  --    Cardiac Enzymes: Recent Labs  Lab 03/16/22 2110  CKTOTAL 301    BNP: Invalid input(s): "POCBNP" CBG: Recent Labs  Lab 03/16/22 2121  GLUCAP 90   D-Dimer No results for input(s): "DDIMER" in the last 72 hours. Hgb A1c No results for input(s): "HGBA1C" in the last 72 hours. Lipid Profile No results for input(s): "CHOL", "HDL", "LDLCALC", "TRIG", "CHOLHDL", "LDLDIRECT" in the last 72 hours. Thyroid function studies No results for input(s): "TSH", "T4TOTAL", "T3FREE", "THYROIDAB" in the last 72 hours.  Invalid input(s): "FREET3" Anemia work up No results for input(s): "VITAMINB12", "FOLATE", "FERRITIN", "TIBC", "IRON", "RETICCTPCT" in the last 72 hours. Urinalysis    Component Value Date/Time   COLORURINE YELLOW 03/17/2022 0053   APPEARANCEUR HAZY (A) 03/17/2022 0053   LABSPEC 1.014 03/17/2022 0053   PHURINE 5.0 03/17/2022 0053   GLUCOSEU NEGATIVE 03/17/2022 0053   HGBUR LARGE (A) 03/17/2022 0053   BILIRUBINUR NEGATIVE 03/17/2022 0053   BILIRUBINUR neg 12/21/2017 1551   KETONESUR NEGATIVE 03/17/2022 0053   PROTEINUR NEGATIVE 03/17/2022 0053   UROBILINOGEN 0.2 12/21/2017 1551   NITRITE NEGATIVE 03/17/2022 0053   LEUKOCYTESUR NEGATIVE 03/17/2022 0053   Sepsis Labs Recent Labs  Lab 03/16/22 2110  WBC 10.2   Microbiology No results found for this or any previous visit (from the past 240 hour(s)).   Time coordinating  discharge: Over 30 minutes  SIGNED:   Huey Bienenstock, MD  [facility] 03/17/2022, 4:01 PM Pager   If 7PM-7AM, please contact night-coverage [email or web address]
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
## 2022-03-17 NOTE — Assessment & Plan Note (Signed)
Pt in to ED with AMS, lethargy.  Finally had reduced respiratory rate around 1am prompting ED to give him narcan which reversed his mental status. Now AAOx3, but adamantly denying taking any opiates yesterday. DDx includes seizure given HPI, but that wouldn't explain marked and sudden improvement in mental status post narcan. Getting admitted for observation for the remainder of the night Tele monitor Mental status already markedly improved since presentation

## 2022-03-30 ENCOUNTER — Encounter (HOSPITAL_COMMUNITY): Payer: Self-pay

## 2022-03-30 ENCOUNTER — Other Ambulatory Visit: Payer: Self-pay

## 2022-03-30 ENCOUNTER — Ambulatory Visit (HOSPITAL_COMMUNITY)
Admission: RE | Admit: 2022-03-30 | Discharge: 2022-03-30 | Disposition: A | Discharge status: Home / Self Care | Payer: Self-pay | Source: Ambulatory Visit | Attending: Internal Medicine | Admitting: Internal Medicine

## 2022-03-30 VITALS — BP 158/102 | HR 81 | Temp 98.0°F | Resp 16

## 2022-03-30 DIAGNOSIS — L089 Local infection of the skin and subcutaneous tissue, unspecified: Secondary | ICD-10-CM

## 2022-03-30 DIAGNOSIS — R21 Rash and other nonspecific skin eruption: Secondary | ICD-10-CM

## 2022-03-30 DIAGNOSIS — B9689 Other specified bacterial agents as the cause of diseases classified elsewhere: Secondary | ICD-10-CM

## 2022-03-30 MED ORDER — MUPIROCIN 2 % EX OINT
1.0000 | TOPICAL_OINTMENT | Freq: Two times a day (BID) | CUTANEOUS | 0 refills | Status: DC
  Filled 2022-03-30: qty 22, 11d supply, fill #0

## 2022-03-30 MED ORDER — DOXYCYCLINE HYCLATE 100 MG PO TABS
100.0000 mg | ORAL_TABLET | Freq: Two times a day (BID) | ORAL | 0 refills | Status: DC
  Filled 2022-03-30: qty 20, 10d supply, fill #0

## 2022-03-30 NOTE — Discharge Instructions (Addendum)
Take doxycycline antibiotic twice daily for the next 7 days. Apply mupirocin ointment to the wounds twice daily for the next 7 days. Apply warm compresses to the abscess of the left leg to allow it to drain and heal.  If you develop any new or worsening symptoms or do not improve in the next 2 to 3 days, please return.  If your symptoms are severe, please go to the emergency room.  Follow-up with your primary care provider for further evaluation and management of your symptoms as well as ongoing wellness visits.  I hope you feel better!

## 2022-03-30 NOTE — ED Triage Notes (Signed)
Pt is here for right leg pain  with a burning sensation in foot . Rash on scrotum and right arm x 1wk
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
## 2022-03-31 ENCOUNTER — Other Ambulatory Visit (HOSPITAL_COMMUNITY): Payer: Self-pay

## 2022-03-31 ENCOUNTER — Telehealth: Payer: Self-pay

## 2022-03-31 ENCOUNTER — Other Ambulatory Visit: Payer: Self-pay

## 2022-03-31 NOTE — Telephone Encounter (Signed)
[facility] Patient Advocate Encounter ? ?Insurance verification completed.   ? ?The patient is uninsured and will need patient assistance for medication. ? ?We can complete the application and will need to meet with the patient for signatures and income documentation. ? ?Justinn Welter, CPhT ?Specialty Pharmacy Patient Advocate ?[facility] ?Phone: [phone] ?Fax:  [phone]  ?

## 2022-03-31 NOTE — Telephone Encounter (Signed)
Patient do not have insurance so he will need to sign the paperwork on his visit tomorrow.

## 2022-03-31 NOTE — Telephone Encounter (Signed)
Patient walked in office today stating he was told to follow up with our office due to positive Hep C results by the ED Patient seen by Dr. Daiva Eves in the past and would like to see Dr. Daiva Eves for Hep C. Patient scheduled tomorrow. Labs in the chart. Jahayra Mazo T Pricilla Loveless

## 2022-04-01 ENCOUNTER — Ambulatory Visit: Payer: Self-pay | Admitting: Infectious Disease

## 2022-04-06 DIAGNOSIS — Z7902 Long term (current) use of antithrombotics/antiplatelets: Secondary | ICD-10-CM | POA: Insufficient documentation

## 2022-04-06 DIAGNOSIS — Z7982 Long term (current) use of aspirin: Secondary | ICD-10-CM | POA: Insufficient documentation

## 2022-04-06 DIAGNOSIS — Z79899 Other long term (current) drug therapy: Secondary | ICD-10-CM | POA: Insufficient documentation

## 2022-04-06 DIAGNOSIS — M5416 Radiculopathy, lumbar region: Secondary | ICD-10-CM | POA: Insufficient documentation

## 2022-04-07 ENCOUNTER — Ambulatory Visit: Payer: Self-pay | Admitting: Infectious Diseases

## 2022-04-07 ENCOUNTER — Other Ambulatory Visit: Payer: Self-pay

## 2022-04-07 ENCOUNTER — Emergency Department (HOSPITAL_COMMUNITY)
Admission: EM | Admit: 2022-04-07 | Discharge: 2022-04-07 | Disposition: A | Discharge status: Home / Self Care | Payer: Self-pay | Attending: Emergency Medicine | Admitting: Emergency Medicine

## 2022-04-07 ENCOUNTER — Encounter (HOSPITAL_COMMUNITY): Payer: Self-pay | Admitting: Emergency Medicine

## 2022-04-07 DIAGNOSIS — M5416 Radiculopathy, lumbar region: Secondary | ICD-10-CM

## 2022-04-07 MED ORDER — OXYCODONE-ACETAMINOPHEN 5-325 MG PO TABS: 1.0000 | ORAL_TABLET | Freq: Four times a day (QID) | ORAL | 0 refills | Status: DC | PRN

## 2022-04-07 MED ORDER — OXYCODONE-ACETAMINOPHEN 5-325 MG PO TABS
1.0000 | ORAL_TABLET | Freq: Once | ORAL | Status: AC
  Administered 2022-04-07: 1 via ORAL
  Filled 2022-04-07: qty 1

## 2022-04-07 MED ORDER — GABAPENTIN 300 MG PO CAPS
300.0000 mg | ORAL_CAPSULE | Freq: Once | ORAL | Status: AC
  Administered 2022-04-07: 300 mg via ORAL
  Filled 2022-04-07: qty 1

## 2022-04-07 MED ORDER — GABAPENTIN 100 MG PO CAPS
200.0000 mg | ORAL_CAPSULE | Freq: Three times a day (TID) | ORAL | 2 refills | Status: DC
  Filled 2022-04-07: qty 180, 30d supply, fill #0

## 2022-04-07 MED ORDER — OXYCODONE-ACETAMINOPHEN 5-325 MG PO TABS
ORAL_TABLET | ORAL | 0 refills | Status: DC
  Filled 2022-04-07: qty 15, 3d supply, fill #0

## 2022-04-07 NOTE — ED Triage Notes (Addendum)
  Patient comes in with R leg pain that has been going on for 3 days.  Patient states he was recently admitted for stroke and was in the hospital for 2+ weeks.  Patient states he has been doing physical therapy for the leg and not needed his pain medication until these past few days.  Patient endorses a shooting pain from R hip down to arch of R foot.  Pain 8/10, shooting/throbbing.    Strong pedal pulses palpated.

## 2022-04-07 NOTE — Progress Notes (Deleted)
Patient Name: Franklin Anderson  Date of Birth: 03-05-1971  MRN: [id number]  PCP: Cain Saupe, MD  Referring Provider: Cain Saupe, MD, Ph#: [phone]    CC:  New patient - initial evaluation and management of chronic hepatitis C infection.   HPI/ROS:  Franklin Anderson is a 51 y.o. male. 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 Last saw Dr. Daiva Eves on cephalexin. He did not follow up after this visit  He received a screening test for hepatitis C Feb 26 2022 at [facility] encounter with viral load 3.15 million copies. He had a negative RNA test in 2020 and at that point seemed to have spontaneously cleared.  Patient tested positive {time; misc:30499}. Hepatitis C-associated risk factors present are: {hep c risks pos:13207}. Patient denies {hep c risks neg:13208}. Patient {has/not:15037} had other studies performed. Results: {hep c studies:13209}. Patient {has/not:15037} had prior treatment for Hepatitis C. Patient {does/do/not:33181} have a past history of liver disease. Patient {does/do/not:33181} have a family history of liver disease. Patient {does/do/not:33181}  have associated signs or symptoms related to liver disease.  Labs reviewed and confirm chronic hepatitis C with a positive viral load.   Records reviewed from ***  ***  Patient {does/does not:19866} have documented immunity to Hepatitis A. Patient {does/does not:19867} have documented immunity to Hepatitis B.    {ros - complete:22885} All other systems reviewed and are negative      Past Medical History:  Diagnosis Date   Drug abuse (HCC)    H/O: rheumatic fever    Diagnosed at 51 y/o.  Resolved at 51 y/o.  States he was diagnosed with heart murmur felt due to this   Hypertension 2012    Prior to Admission medications   Medication Sig Start Date End Date Taking? Authorizing Provider  amLODipine (NORVASC) 10 MG tablet Take 1 tablet (10 mg total) by  mouth daily. 03/12/22   Angiulli, Mcarthur Rossetti, PA-C  aspirin 81 MG chewable tablet Chew 1 tablet (81 mg total) by mouth daily. 03/05/22   Standley Brooking, MD  cloNIDine (CATAPRES) 0.2 MG tablet Take 1 tablet (0.2 mg total) by mouth 2 (two) times daily. 03/12/22   Angiulli, Mcarthur Rossetti, PA-C  clopidogrel (PLAVIX) 75 MG tablet Take 1 tablet (75 mg total) by mouth daily. 03/12/22   Angiulli, Mcarthur Rossetti, PA-C  diclofenac Sodium (VOLTAREN) 1 % GEL Apply 4 g topically 4 (four) times daily. 03/12/22   Angiulli, Mcarthur Rossetti, PA-C  doxycycline (VIBRA-TABS) 100 MG tablet Take 1 tablet (100 mg total) by mouth 2 (two) times daily. 03/30/22   Carlisle Beers, FNP  gabapentin (NEURONTIN) 100 MG capsule Take 2 capsules (200 mg total) by mouth 3 (three) times daily. 200 mg three times daily for 5 days then increase to 300 mg three times daily for at least 5 days before increasing by 100 per dose each dose until pain is more tolerable 04/07/22   Mesner, Barbara Cower, MD  gabapentin (NEURONTIN) 300 MG capsule Take 1 capsule (300 mg total) by mouth 3 (three) times daily. 03/12/22   Angiulli, Mcarthur Rossetti, PA-C  leptospermum manuka honey (MEDIHONEY) PSTE paste Apply 1 Application topically daily. 03/12/22   Angiulli, Mcarthur Rossetti, PA-C  lidocaine (LIDODERM) 5 % Place 1 patch onto the skin daily. Remove & Discard patch within 12 hours or as directed by MD 03/12/22   Angiulli, Mcarthur Rossetti, PA-C  metoprolol tartrate (LOPRESSOR) 100 MG tablet Take  1 tablet (100 mg total) by mouth 2 (two) times daily. 03/12/22   Angiulli, Mcarthur Rossetti, PA-C  mupirocin ointment (BACTROBAN) 2 % Apply 1 Application topically 2 (two) times daily. 03/30/22   Carlisle Beers, FNP  nicotine (NICODERM CQ - DOSED IN MG/24 HOURS) 14 mg/24hr patch 14 mg patch daily x3 weeks then 7 mg patch daily x3 weeks and stop 03/12/22   Angiulli, Mcarthur Rossetti, PA-C  nortriptyline (PAMELOR) 25 MG capsule Take 1 capsule (25 mg total) by mouth at bedtime. 03/12/22   Angiulli, Mcarthur Rossetti, PA-C   oxyCODONE-acetaminophen (PERCOCET/ROXICET) 5-325 MG tablet Take 1 tablet by mouth every 6 (six) hours as needed for severe pain. 04/07/22   Mesner, Barbara Cower, MD    Allergies  Allergen Reactions   Sulfa Antibiotics Anaphylaxis   Penicillins Other (See Comments)    "Near death" reaction per pt 03/25/22, could not elaborate.    Social History   Tobacco Use   Smoking status: Every Day    Packs/day: 1.50    Years: 16.00    Total pack years: 24.00    Types: Cigarettes    Passive exposure: Past   Smokeless tobacco: Former    Types: Associate Professor Use: Former   Quit date: 04/27/2017  Substance Use Topics   Alcohol use: No    Alcohol/week: 0.0 standard drinks of alcohol    Comment: Clean since Sep 08, 2008   Drug use: Not Currently    Types: IV    Comment: Heroin--clean since 01/2019    Family History  Problem Relation Age of Onset   Cancer Father        Prostate cancer--cause of death   Coronary artery disease Father        6 CABG after MI   COPD Mother    Lymphoma Mother 48       Unknown type   Hypertension Mother    Aneurysm Brother 48       Brain-cause of death   Hypertension Brother    ***  Objective:  There were no vitals filed for this visit. Constitutional: {EXAM; GENERAL APPEARANCE:5021} Eyes: anicteric Cardiovascular: {Mis exam cardio:32073} Respiratory: {Exam; lungs brief:12271} Gastrointestinal: {Exam; abdomen:5794} Musculoskeletal: {extremities:315109} Skin: {Skin exam gi:12013}; no porphyria cutanea tarda Lymphatic: no cervical lymphadenopathy   Laboratory: Genotype:  Lab Results  Component Value Date   HCVGENOTYPE RTNI 02/22/2019   HCV viral load:  Lab Results  Component Value Date   HCVQUANT 3,150,000 02/27/2022   Lab Results  Component Value Date   WBC 10.2 03/16/2022   HGB 11.9 (L) 03/16/2022   HCT 35.0 (L) 03/16/2022   MCV 98.6 03/16/2022   PLT 511 (H) 03/16/2022    Lab Results  Component Value Date   CREATININE 1.15  03/16/2022   BUN 24 (H) 03/16/2022   NA 141 03/16/2022   K 3.9 03/16/2022   CL 103 03/16/2022   CO2 24 03/16/2022    Lab Results  Component Value Date   ALT 36 03/16/2022   AST 32 03/16/2022   ALKPHOS 65 03/16/2022    Lab Results  Component Value Date   INR 1.0 03/04/2022   BILITOT 0.3 03/16/2022   ALBUMIN 3.9 03/16/2022    APRI ***   FIB-4 ***   Imaging:  ***  Assessment & Plan:   Problem List Items Addressed This Visit   None   I spent 45 minutes with the patient including greater than 70% of time in face to face  counsel of the patient re hepatitis c and the details described above and in coordination of their care.  Rexene Alberts, MSN, NP-C [facility]  [email or web address] Pager: [phone] Office: [phone] [facility] Main Line: [phone]

## 2022-04-07 NOTE — ED Provider Notes (Signed)
[facility] EMERGENCY DEPARTMENT Provider Note   CSN: [id number] Arrival date & time: 04/06/22  2251     History  Chief Complaint  Patient presents with   Leg Pain    Franklin Anderson is a 51 y.o. male.  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 Has had some radiculopathic pain from it since the hospitalization controlled with gabapentin. Over the last couple days it has worsened and is now spreading down his leg. Severe in nature. Stabbing. Shooting.    Leg Pain      Home Medications Prior to Admission medications   Medication Sig Start Date End Date Taking? Authorizing Provider  gabapentin (NEURONTIN) 100 MG capsule Take 2 capsules (200 mg total) by mouth 3 (three) times daily. 200 mg three times daily for 5 days then increase to 300 mg three times daily for at least 5 days before increasing by 100 per dose each dose until pain is more tolerable 04/07/22  Yes Herrick Hartog, Barbara Cower, MD  oxyCODONE-acetaminophen (PERCOCET/ROXICET) 5-325 MG tablet Take 1 tablet by mouth every 6 (six) hours as needed for severe pain. 04/07/22  Yes Xavious Sharrar, Barbara Cower, MD  amLODipine (NORVASC) 10 MG tablet Take 1 tablet (10 mg total) by mouth daily. 03/12/22   Angiulli, Mcarthur Rossetti, PA-C  aspirin 81 MG chewable tablet Chew 1 tablet (81 mg total) by mouth daily. 03/05/22   Standley Brooking, MD  cloNIDine (CATAPRES) 0.2 MG tablet Take 1 tablet (0.2 mg total) by mouth 2 (two) times daily. 03/12/22   Angiulli, Mcarthur Rossetti, PA-C  clopidogrel (PLAVIX) 75 MG tablet Take 1 tablet (75 mg total) by mouth daily. 03/12/22   Angiulli, Mcarthur Rossetti, PA-C  diclofenac Sodium (VOLTAREN) 1 % GEL Apply 4 g topically 4 (four) times daily. 03/12/22   Angiulli, Mcarthur Rossetti, PA-C  doxycycline (VIBRA-TABS) 100 MG tablet Take 1 tablet (100 mg total) by mouth 2 (two) times daily. 03/30/22   Carlisle Beers, FNP  gabapentin (NEURONTIN) 300 MG capsule Take 1 capsule (300 mg total) by  mouth 3 (three) times daily. 03/12/22   Angiulli, Mcarthur Rossetti, PA-C  leptospermum manuka honey (MEDIHONEY) PSTE paste Apply 1 Application topically daily. 03/12/22   Angiulli, Mcarthur Rossetti, PA-C  lidocaine (LIDODERM) 5 % Place 1 patch onto the skin daily. Remove & Discard patch within 12 hours or as directed by MD 03/12/22   Angiulli, Mcarthur Rossetti, PA-C  metoprolol tartrate (LOPRESSOR) 100 MG tablet Take 1 tablet (100 mg total) by mouth 2 (two) times daily. 03/12/22   Angiulli, Mcarthur Rossetti, PA-C  mupirocin ointment (BACTROBAN) 2 % Apply 1 Application topically 2 (two) times daily. 03/30/22   Carlisle Beers, FNP  nicotine (NICODERM CQ - DOSED IN MG/24 HOURS) 14 mg/24hr patch 14 mg patch daily x3 weeks then 7 mg patch daily x3 weeks and stop 03/12/22   Angiulli, Mcarthur Rossetti, PA-C  nortriptyline (PAMELOR) 25 MG capsule Take 1 capsule (25 mg total) by mouth at bedtime. 03/12/22   Angiulli, Mcarthur Rossetti, PA-C      Allergies    Sulfa antibiotics and Penicillins    Review of Systems   Review of Systems  Physical Exam Updated Vital Signs BP 134/88 (BP Location: Left Arm)   Pulse 82   Temp 98 F (36.7 C) (Oral)   Resp 18   Ht 6' (1.829 m)   Wt 61.2 kg   SpO2 98%   BMI 18.31 kg/m  Physical Exam Vitals and nursing note reviewed.  Constitutional:      Appearance: He is well-developed.  HENT:     Head: Normocephalic and atraumatic.     Mouth/Throat:     Mouth: Mucous membranes are moist.     Pharynx: Oropharynx is clear.  Eyes:     Pupils: Pupils are equal, round, and reactive to light.  Cardiovascular:     Rate and Rhythm: Normal rate.  Pulmonary:     Effort: Pulmonary effort is normal. No respiratory distress.  Abdominal:     General: Abdomen is flat. There is no distension.  Musculoskeletal:        General: Normal range of motion.     Cervical back: Normal range of motion.  Skin:    General: Skin is warm and dry.     Findings: No rash.  Neurological:     Mental Status: He is alert.      Comments: Equal plantar/dorsal flexion on both sies. Decreased DTR in patella on right.      ED Results / Procedures / Treatments   Labs (all labs ordered are listed, but only abnormal results are displayed) Labs Reviewed - No data to display  EKG None  Radiology No results found.  Procedures Procedures    Medications Ordered in ED Medications  gabapentin (NEURONTIN) capsule 300 mg (300 mg Oral Given 04/07/22 0210)  oxyCODONE-acetaminophen (PERCOCET/ROXICET) 5-325 MG per tablet 1 tablet (1 tablet Oral Given 04/07/22 0210)    ED Course/ Medical Decision Making/ A&P                           Medical Decision Making Risk Prescription drug management.   Worsening radiculopathic pain without trauma, incontinence or other red flag symptoms possibly from improving palsy from prolonged compression.  Will increase neurontin w/ short course of narcotics.    Final Clinical Impression(s) / ED Diagnoses Final diagnoses:  Lumbar radiculopathy    Rx / DC Orders ED Discharge Orders          Ordered    gabapentin (NEURONTIN) 100 MG capsule  3 times daily        04/07/22 0204    oxyCODONE-acetaminophen (PERCOCET/ROXICET) 5-325 MG tablet  Every 6 hours PRN        04/07/22 0206              Suvi Archuletta, Barbara Cower, MD 04/07/22 0430

## 2022-04-08 ENCOUNTER — Other Ambulatory Visit: Payer: Self-pay

## 2022-04-09 ENCOUNTER — Other Ambulatory Visit: Payer: Self-pay

## 2022-04-14 ENCOUNTER — Other Ambulatory Visit: Payer: Self-pay

## 2022-04-14 ENCOUNTER — Ambulatory Visit: Payer: Self-pay | Admitting: Critical Care Medicine

## 2022-04-14 NOTE — Progress Notes (Deleted)
New Patient Office Visit  Subjective    Patient ID: Franklin Anderson, male    DOB: 09/07/1970  Age: 51 y.o. MRN: [id number]  CC: No chief complaint on file.   HPI Franklin Gaulaul R Journey presents to establish care Flu  tdap colon Recent ED lumbar radiculopathy, rash, somnolence CVA 02/2022 Rehab DC in NOve[place] Admit date: 03/05/2022 Discharge date: 03/13/2022   Discharge Diagnoses:  Principal Problem:   CVA (cerebral vascular accident) [facility](HCC) DVT prophylaxis Pain management Severe rhabdo/AKI Elevated LFTs Severe muscle inflammation of right hip and gluteal muscle/crush injury History of polysubstance abuse Hypertension Anemia   Discharged Condition: Stable   Significant Diagnostic Studies:  Imaging Results  ECHO TEE   Result Date: 03/04/2022    TRANSESOPHOGEAL ECHO REPORT   Patient Name:   Franklin Gaulaul R Grossi Date of Exam: 03/04/2022 Medical Rec #:  [id number]      Height:       72.0 in Accession #:    [id number]     Weight:       162.9 lb Date of Birth:  09/07/1970     BSA:          1.953 m Patient Age:    51 years       BP:           149/105 mmHg Patient Gender: M              HR:           85 bpm. Exam Location:  Inpatient Procedure: Transesophageal Echo, Color Doppler and Cardiac Doppler Indications:     Stroke i63.9  History:         Patient has prior history of Echocardiogram examinations, most                  recent 02/25/2022. Risk Factors:Hypertension and Polysubstance                  Abuse.  Sonographer:     Irving BurtonEmily Senior RDCS Referring Phys:  Nada BoozerINGOLD, LAURA, R Diagnosing Phys: Thomasene RippleKardie Tobb DO PROCEDURE: After discussion of the risks and benefits of a TEE, an informed consent was obtained from the patient. The transesophogeal probe was passed without difficulty through the esophogus of the patient. Sedation performed by different physician. The patient was monitored while under deep sedation. Anesthestetic sedation was provided intravenously by Anesthesiology: 142mg  of Propofol, 60mg  of  Lidocaine. The patient's vital signs; including heart rate, blood pressure, and oxygen saturation; remained stable throughout the procedure. The patient developed no complications during the procedure.  IMPRESSIONS  1. Left ventricular ejection fraction, by estimation, is 55 to 60%. The left ventricle has normal function. The left ventricle has no regional wall motion abnormalities.  2. Right ventricular systolic function is normal. The right ventricular size is normal.  3. No left atrial/left atrial appendage thrombus was detected. The LAA emptying velocity was 96 cm/s.  4. The mitral valve is normal in structure. No evidence of mitral valve regurgitation. No evidence of mitral stenosis.  5. The aortic valve is normal in structure. Aortic valve regurgitation is trivial. No aortic stenosis is present.  6. There is Moderate (Grade III) layered and protruding plaque involving the descending aorta and aortic arch.  7. The inferior vena cava is normal in size with greater than 50% respiratory variability, suggesting right atrial pressure of 3 mmHg.  8. Agitated saline contrast bubble study was negative, with no evidence of any interatrial shunt. Conclusion(s)/Recommendation(s): Normal biventricular function  without evidence of hemodynamically significant valvular heart disease. FINDINGS  Left Ventricle: Left ventricular ejection fraction, by estimation, is 55 to 60%. The left ventricle has normal function. The left ventricle has no regional wall motion abnormalities. The left ventricular internal cavity size was normal in size. There is  no left ventricular hypertrophy. Right Ventricle: The right ventricular size is normal. No increase in right ventricular wall thickness. Right ventricular systolic function is normal. Left Atrium: Left atrial size was normal in size. No left atrial/left atrial appendage thrombus was detected. The LAA emptying velocity was 96 cm/s. Right Atrium: Right atrial size was normal in size.  Pericardium: There is no evidence of pericardial effusion. Mitral Valve: The mitral valve is normal in structure. No evidence of mitral valve regurgitation. No evidence of mitral valve stenosis. Tricuspid Valve: The tricuspid valve is normal in structure. Tricuspid valve regurgitation is trivial. No evidence of tricuspid stenosis. Aortic Valve: The aortic valve is normal in structure. Aortic valve regurgitation is trivial. No aortic stenosis is present. Pulmonic Valve: The pulmonic valve was normal in structure. Pulmonic valve regurgitation is not visualized. No evidence of pulmonic stenosis. Aorta: The aortic root is normal in size and structure. There is moderate (Grade III) layered and protruding plaque involving the descending aorta and aortic arch. Venous: The left upper pulmonary vein, left lower pulmonary vein, right upper pulmonary vein and right lower pulmonary vein are normal. A normal flow pattern is recorded from the left upper pulmonary vein. The inferior vena cava is normal in size with greater than 50% respiratory variability, suggesting right atrial pressure of 3 mmHg. IAS/Shunts: No atrial level shunt detected by color flow Doppler. Agitated saline contrast bubble study was negative, with no evidence of any interatrial shunt. Additional Comments: Spectral Doppler performed. Thomasene Ripple DO Electronically signed by Thomasene Ripple DO Signature Date/Time: 03/04/2022/2:09:33 PM    Final     VAS US CAROTID   Result Date: 02/26/2022 Carotid Arterial Duplex Study Patient Name:  MAXAMUS COLAO  Date of Exam:   02/25/2022 Medical Rec #: [id number]       Accession #:    [id number] Date of Birth: November 05, 1970      Patient Gender: M Patient Age:   51 years Exam Location:  [facility] Procedure:      VAS US CAROTID Referring Phys: Scheryl Marten XU --------------------------------------------------------------------------------  Indications:       CVA. Risk Factors:      Hypertension. Comparison Study:  no prior  Performing Technologist: [place] Ponder RVS  Examination Guidelines: A complete evaluation includes B-mode imaging, spectral Doppler, color Doppler, and power Doppler as needed of all accessible portions of each vessel. Bilateral testing is considered an integral part of a complete examination. Limited examinations for reoccurring indications may be performed as noted.  Right Carotid Findings: +----------+--------+--------+--------+------------------+--------+           PSV cm/sEDV cm/sStenosisPlaque DescriptionComments +----------+--------+--------+--------+------------------+--------+ CCA Prox  90      29              heterogenous               +----------+--------+--------+--------+------------------+--------+ CCA Distal57      24              heterogenous               +----------+--------+--------+--------+------------------+--------+ ICA Prox  53      21              heterogenous               +----------+--------+--------+--------+------------------+--------+  ICA Distal71      30                                         +----------+--------+--------+--------+------------------+--------+ ECA       81      27                                         +----------+--------+--------+--------+------------------+--------+ +----------+--------+-------+--------+-------------------+           PSV cm/sEDV cmsDescribeArm Pressure (mmHG) +----------+--------+-------+--------+-------------------+ Subclavian89                                         +----------+--------+-------+--------+-------------------+ +---------+--------+--+--------+--+---------+ VertebralPSV cm/s41EDV cm/s18Antegrade +---------+--------+--+--------+--+---------+  Left Carotid Findings: +----------+--------+--------+--------+------------------+--------+           PSV cm/sEDV cm/sStenosisPlaque DescriptionComments +----------+--------+--------+--------+------------------+--------+ CCA  Prox  93      29              heterogenous               +----------+--------+--------+--------+------------------+--------+ CCA Distal73      27              heterogenous               +----------+--------+--------+--------+------------------+--------+ ICA Prox  64      35              heterogenous               +----------+--------+--------+--------+------------------+--------+ ICA Distal83      40                                         +----------+--------+--------+--------+------------------+--------+ ECA       91      23                                         +----------+--------+--------+--------+------------------+--------+ +----------+--------+--------+--------+-------------------+           PSV cm/sEDV cm/sDescribeArm Pressure (mmHG) +----------+--------+--------+--------+-------------------+ [id number]                                          +----------+--------+--------+--------+-------------------+ +---------+--------+--+--------+--+---------+ VertebralPSV cm/s35EDV cm/s10Antegrade +---------+--------+--+--------+--+---------+   Summary: Right Carotid: The extracranial vessels were near-normal with only minimal wall                thickening or plaque. Left Carotid: The extracranial vessels were near-normal with only minimal wall               thickening or plaque. Vertebrals: Bilateral vertebral arteries demonstrate antegrade flow. *See table(s) above for measurements and observations.  Electronically signed by Delia Heady MD on 02/26/2022 at 8:04:03 AM.    Final     ECHOCARDIOGRAM COMPLETE   Result Date: 02/25/2022    ECHOCARDIOGRAM REPORT   Patient Name:   LANDEN KNOEDLER Parlier Date of Exam: 02/25/2022 Medical Rec #:  [id number]  Height:       72.0 in Accession #:    [id number]     Weight:       125.0 lb Date of Birth:  Sep 15, 1970     BSA:          1.745 m Patient Age:    51 years       BP:           174/116 mmHg Patient Gender: M               HR:           83 bpm. Exam Location:  Inpatient Procedure: 2D Echo, Color Doppler and Cardiac Doppler Indications:    Stroke  History:        Patient has no prior history of Echocardiogram examinations.                 Signs/Symptoms:Murmur; Risk Factors:Hypertension, Family History                 of Coronary Artery Disease and Current Smoker. Polysubstance                 abuse, rheumatic fever (as a child).  Sonographer:    Milda Smart Referring Phys: [id number] XU  Sonographer Comments: Image acquisition challenging due to patient body habitus and Image acquisition challenging due to respiratory motion. IMPRESSIONS  1. Left ventricular ejection fraction, by estimation, is 55%. The left ventricle has normal function. The left ventricle has no regional wall motion abnormalities. There is mild concentric left ventricular hypertrophy. Left ventricular diastolic parameters are consistent with Grade I diastolic dysfunction (impaired relaxation).  2. Right ventricular systolic function is normal. The right ventricular size is normal. Tricuspid regurgitation signal is inadequate for assessing PA pressure.  3. The mitral valve is normal in structure. Trivial mitral valve regurgitation. No evidence of mitral stenosis.  4. The aortic valve is tricuspid. Aortic valve regurgitation is trivial. No aortic stenosis is present.  5. The inferior vena cava is normal in size with greater than 50% respiratory variability, suggesting right atrial pressure of 3 mmHg. FINDINGS  Left Ventricle: Left ventricular ejection fraction, by estimation, is 55%. The left ventricle has normal function. The left ventricle has no regional wall motion abnormalities. The left ventricular internal cavity size was normal in size. There is mild concentric left ventricular hypertrophy. Left ventricular diastolic parameters are consistent with Grade I diastolic dysfunction (impaired relaxation). Right Ventricle: The right ventricular size is  normal. No increase in right ventricular wall thickness. Right ventricular systolic function is normal. Tricuspid regurgitation signal is inadequate for assessing PA pressure. Left Atrium: Left atrial size was normal in size. Right Atrium: Right atrial size was normal in size. Pericardium: Trivial pericardial effusion is present. Mitral Valve: The mitral valve is normal in structure. Trivial mitral valve regurgitation. No evidence of mitral valve stenosis. MV peak gradient, 4.0 mmHg. The mean mitral valve gradient is 2.0 mmHg. Tricuspid Valve: The tricuspid valve is normal in structure. Tricuspid valve regurgitation is not demonstrated. Aortic Valve: The aortic valve is tricuspid. Aortic valve regurgitation is trivial. No aortic stenosis is present. Pulmonic Valve: The pulmonic valve was normal in structure. Pulmonic valve regurgitation is not visualized. Aorta: The aortic root is normal in size and structure. Venous: The inferior vena cava is normal in size with greater than 50% respiratory variability, suggesting right atrial pressure of 3 mmHg. IAS/Shunts: No atrial level shunt detected by color flow Doppler.  LEFT VENTRICLE  PLAX 2D LVIDd:         4.50 cm      Diastology LVIDs:         4.00 cm      LV e' medial:    8.55 cm/s LV PW:         1.20 cm      LV E/e' medial:  7.8 LV IVS:        0.90 cm      LV e' lateral:   8.86 cm/s LVOT diam:     2.10 cm      LV E/e' lateral: 7.5 LV SV:         67 LV SV Index:   38 LVOT Area:     3.46 cm  LV Volumes (MOD) LV vol d, MOD A2C: 93.4 ml LV vol d, MOD A4C: 149.0 ml LV vol s, MOD A2C: 52.3 ml LV vol s, MOD A4C: 84.5 ml LV SV MOD A2C:     41.1 ml LV SV MOD A4C:     149.0 ml LV SV MOD BP:      48.2 ml RIGHT VENTRICLE             IVC RV S prime:     15.50 cm/s  IVC diam: 2.00 cm TAPSE (M-mode): 1.8 cm LEFT ATRIUM             Index        RIGHT ATRIUM           Index LA diam:        2.90 cm 1.66 cm/m   RA Area:     13.90 cm LA Vol (A2C):   63.8 ml 36.57 ml/m  RA Volume:    34.50 ml  19.77 ml/m LA Vol (A4C):   28.5 ml 16.33 ml/m LA Biplane Vol: 43.1 ml 24.70 ml/m  AORTIC VALVE LVOT Vmax:   132.00 cm/s LVOT Vmean:  87.200 cm/s LVOT VTI:    0.193 m  AORTA Ao Root diam: 2.90 cm Ao Asc diam:  3.20 cm MITRAL VALVE MV Area (PHT): 3.06 cm    SHUNTS MV Area VTI:   2.88 cm    Systemic VTI:  0.19 m MV Peak grad:  4.0 mmHg    Systemic Diam: 2.10 cm MV Mean grad:  2.0 mmHg MV Vmax:       1.00 m/s MV Vmean:      64.2 cm/s MV Decel Time: 248 msec MV E velocity: 66.70 cm/s MV A velocity: 68.90 cm/s MV E/A ratio:  0.97 Dalton McleanMD Electronically signed by Wilfred Lacy Signature Date/Time: 02/25/2022/3:04:29 PM    Final     VAS [place] LOWER EXTREMITY VENOUS (DVT)   Result Date: 02/25/2022  Lower Venous DVT Study Patient Name:  MUHAMMADALI RIES  Date of Exam:   02/25/2022 Medical Rec #: [id number]       Accession #:    [id number] Date of Birth: [birth year]      Patient Gender: M Patient Age:   73 years Exam Location:  [facility] Procedure:      VAS [place] LOWER EXTREMITY VENOUS (DVT) Referring Phys: Scheryl Marten XU --------------------------------------------------------------------------------  Indications: Stroke.  Comparison Study: prior 03/01/19 Performing Technologist: [place] Ponder RVS  Examination Guidelines: A complete evaluation includes B-mode imaging, spectral Doppler, color Doppler, and power Doppler as needed of all accessible portions of each vessel. Bilateral testing is considered an integral part of a complete examination. Limited examinations for reoccurring indications may be performed  as noted. The reflux portion of the exam is performed with the patient in reverse Trendelenburg.  +--------+---------------+---------+-----------+----------+--------------------+ RIGHT   CompressibilityPhasicitySpontaneityPropertiesThrombus Aging       +--------+---------------+---------+-----------+----------+--------------------+ CFV     Full           Yes      Yes                                        +--------+---------------+---------+-----------+----------+--------------------+ SFJ     Full                                                              +--------+---------------+---------+-----------+----------+--------------------+ FV Prox Full                                                              +--------+---------------+---------+-----------+----------+--------------------+ FV Mid  Full           Yes      Yes                                       +--------+---------------+---------+-----------+----------+--------------------+ FV      Full                                                              Distal                                                                    +--------+---------------+---------+-----------+----------+--------------------+ PFV     Full                                                              +--------+---------------+---------+-----------+----------+--------------------+ POP     Full           Yes      Yes                                       +--------+---------------+---------+-----------+----------+--------------------+ PTV     Full           Yes      Yes                  patent by color  doppler              +--------+---------------+---------+-----------+----------+--------------------+ PERO    Full           Yes      Yes                  patent by color                                                           doppler              +--------+---------------+---------+-----------+----------+--------------------+   +---------+---------------+---------+-----------+----------+--------------+ LEFT     CompressibilityPhasicitySpontaneityPropertiesThrombus Aging +---------+---------------+---------+-----------+----------+--------------+ CFV      Full           Yes      Yes                                  +---------+---------------+---------+-----------+----------+--------------+ SFJ      Full                                                        +---------+---------------+---------+-----------+----------+--------------+ FV Prox  Full                                                        +---------+---------------+---------+-----------+----------+--------------+ FV Mid   Full                                                        +---------+---------------+---------+-----------+----------+--------------+ FV DistalFull                                                        +---------+---------------+---------+-----------+----------+--------------+ PFV      Full                                                        +---------+---------------+---------+-----------+----------+--------------+ POP      Full           Yes      Yes                                 +---------+---------------+---------+-----------+----------+--------------+ PTV      Full                                                        +---------+---------------+---------+-----------+----------+--------------+  PERO     Full                                                        +---------+---------------+---------+-----------+----------+--------------+     Summary: BILATERAL: - No evidence of deep vein thrombosis seen in the lower extremities, bilaterally. -No evidence of popliteal cyst, bilaterally.   *See table(s) above for measurements and observations. Electronically signed by Sherald Hess MD on 02/25/2022 at 12:07:47 PM.    Final     MR ANGIO HEAD WO CONTRAST   Result Date: 02/25/2022 CLINICAL DATA:  Stroke, follow up EXAM: MRA HEAD WITHOUT CONTRAST TECHNIQUE: Angiographic images of the Circle of Willis were acquired using MRA technique without intravenous contrast. COMPARISON:  MRI head February 24, 2022. FINDINGS: Anterior circulation: Bilateral intracranial ICAs, MCAs, and ACAs are  patent without proximal hemodynamically significant stenosis. Posterior circulation: Bilateral intradural vertebral arteries, basilar artery and bilateral posterior cerebral arteries are patent without proximal hemodynamically significant stenosis. Left fetal type PCA, anatomic variant. IMPRESSION: No emergent large vessel occlusion or proximal hemodynamically significant stenosis. Electronically Signed   By: Feliberto Harts M.D.   On: 02/25/2022 09:01    MR BRAIN WO CONTRAST   Result Date: 02/24/2022 CLINICAL DATA:  Altered mental status.  Found down. EXAM: MRI HEAD WITHOUT CONTRAST TECHNIQUE: Multiplanar, multiecho pulse sequences of the brain and surrounding structures were obtained without intravenous contrast. 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 No evidence of hemorrhage. No extra-axial fluid collection. No hydrocephalus. 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 Vascular: Normal flow voids. Skull and upper cervical spine: Normal marrow signal. Sinuses/Orbits: Negative. Other: There is a soft tissue hematoma along the midline frontal scalp. IMPRESSION: 1. 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 No hemorrhage or mass effect. 2. Soft tissue hematoma along the midline frontal scalp. Electronically Signed   By: Lorenza Cambridge M.D.   On: 02/24/2022 10:10    CT EXTREMITY LOWER RIGHT WO CONTRAST   Addendum Date: 02/24/2022   ADDENDUM REPORT: 02/24/2022 03:59 ADDENDUM: Discussed over the phone in detail with Dr. Maisie Fus at 3:51 a.m., 02/24/2022, with verbal acknowledgement of the key findings. Electronically Signed   By: Almira Bar M.D.   On: 02/24/2022 03:59    Result Date: 02/24/2022 CLINICAL DATA:  Severe rhabdomyolysis. Evaluate for compartment syndrome. EXAM: CT  OF THE LOWER RIGHT EXTREMITY WITHOUT CONTRAST TECHNIQUE: Multidetector CT imaging of the entire right lower extremity was performed according to the standard protocol. RADIATION DOSE REDUCTION: This exam was performed according to the departmental dose-optimization program which includes automated exposure control, adjustment of the mA and/or kV according to patient size and/or use of iterative reconstruction technique. COMPARISON:  Limited comparison is available with a right hip CT from 03/31/2019. FINDINGS: Bones/Joint/Cartilage Normal bone mineralization. There is no evidence of fractures or aggressive bone lesions and no findings of right femoral head avascular necrosis. 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 No significant ankle or foot arthropathy is seen. No evidence of joint effusions or hemarthrosis. There is a small accessory navicular  bone incidentally noted as well as a tiny os peroneum alongside the cuboid bone. Ligaments Suboptimally assessed by CT. Muscles and Tendons There is diffuse edema and swelling of the right gluteus medius muscle, the dorsal hip adductors in the proximal thigh and the distal aspect of the vastus medialis. The foreleg and plantar foot musculature are unremarkable without contrast. Area tendons are grossly intact, as visualized. Soft tissues No mass, soft tissue gas or localizing fluid collection is seen. There is scattered nonlocalizing fluid in the dorsal proximal thigh between the muscle groups. There is mild superficial edema in the thigh. IMPRESSION: 1. Diffuse edema and swelling of the right gluteus medius muscle, the dorsal hip adductors in the proximal thigh, and the distal aspect of the vastus medialis. 2. 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 3. No evidence of fractures or  aggressive bone lesions. 4. Mild nonerosive degenerative changes of the right hip and lateral patellofemoral joint. Electronically Signed: By: Almira Bar M.D. On: 02/24/2022 02:26    CT Extrem Up Entire Arm R WO/CM   Addendum Date: 02/24/2022   ADDENDUM REPORT: 02/24/2022 02:27 ADDENDUM: Not mentioned above, there are coarse linear scar-like opacities in the right lung apex and right lower lung field. There is a 4 mm chronic nodule in right lower lung field which was seen on a 2008 abdomen pelvis CT and is unchanged. Electronically Signed   By: Almira Bar M.D.   On: 02/24/2022 02:27    Result Date: 02/24/2022 CLINICAL DATA:  Severe rhabdomyolysis. Evaluate for compartment syndrome findings. EXAM: CT OF THE UPPER RIGHT EXTREMITY WITHOUT CONTRAST TECHNIQUE: Multidetector CT imaging of the upper right extremity was performed according to the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

## 2022-04-15 ENCOUNTER — Encounter: Payer: Self-pay | Admitting: Nurse Practitioner

## 2022-04-15 ENCOUNTER — Ambulatory Visit: Payer: Self-pay | Admitting: Critical Care Medicine

## 2022-04-15 ENCOUNTER — Other Ambulatory Visit: Payer: Self-pay

## 2022-04-15 ENCOUNTER — Ambulatory Visit (INDEPENDENT_AMBULATORY_CARE_PROVIDER_SITE_OTHER): Payer: Self-pay | Admitting: Nurse Practitioner

## 2022-04-15 VITALS — BP 180/120 | HR 102 | Temp 98.0°F | Ht 72.0 in | Wt 134.8 lb

## 2022-04-15 DIAGNOSIS — M5416 Radiculopathy, lumbar region: Secondary | ICD-10-CM | POA: Insufficient documentation

## 2022-04-15 DIAGNOSIS — I639 Cerebral infarction, unspecified: Secondary | ICD-10-CM

## 2022-04-15 DIAGNOSIS — F1910 Other psychoactive substance abuse, uncomplicated: Secondary | ICD-10-CM

## 2022-04-15 DIAGNOSIS — I10 Essential (primary) hypertension: Secondary | ICD-10-CM

## 2022-04-15 DIAGNOSIS — J438 Other emphysema: Secondary | ICD-10-CM

## 2022-04-15 DIAGNOSIS — Z716 Tobacco abuse counseling: Secondary | ICD-10-CM | POA: Insufficient documentation

## 2022-04-15 DIAGNOSIS — R911 Solitary pulmonary nodule: Secondary | ICD-10-CM | POA: Insufficient documentation

## 2022-04-15 DIAGNOSIS — D649 Anemia, unspecified: Secondary | ICD-10-CM | POA: Insufficient documentation

## 2022-04-15 DIAGNOSIS — R7989 Other specified abnormal findings of blood chemistry: Secondary | ICD-10-CM | POA: Insufficient documentation

## 2022-04-15 DIAGNOSIS — J439 Emphysema, unspecified: Secondary | ICD-10-CM | POA: Insufficient documentation

## 2022-04-15 DIAGNOSIS — N179 Acute kidney failure, unspecified: Secondary | ICD-10-CM

## 2022-04-15 MED ORDER — METHYLPREDNISOLONE SODIUM SUCC 40 MG IJ SOLR
40.0000 mg | Freq: Once | INTRAMUSCULAR | Status: AC
  Administered 2022-04-15: 40 mg via INTRAMUSCULAR

## 2022-04-15 MED ORDER — CLONIDINE HCL 0.2 MG PO TABS
0.2000 mg | ORAL_TABLET | Freq: Two times a day (BID) | ORAL | 3 refills | Status: DC
  Filled 2022-04-15: qty 60, 30d supply, fill #0
  Filled 2022-05-11: qty 60, 30d supply, fill #1
  Filled 2022-06-12: qty 60, 30d supply, fill #2

## 2022-04-15 MED ORDER — ASPIRIN 81 MG PO CHEW: 81.0000 mg | CHEWABLE_TABLET | Freq: Every day | ORAL | Status: DC

## 2022-04-15 MED ORDER — METOPROLOL TARTRATE 100 MG PO TABS
100.0000 mg | ORAL_TABLET | Freq: Two times a day (BID) | ORAL | 3 refills | Status: AC
  Filled 2022-04-15: qty 60, 30d supply, fill #0
  Filled 2022-05-11: qty 60, 30d supply, fill #1
  Filled 2022-06-12: qty 60, 30d supply, fill #2

## 2022-04-15 MED ORDER — NORTRIPTYLINE HCL 25 MG PO CAPS
25.0000 mg | ORAL_CAPSULE | Freq: Every day | ORAL | 0 refills | Status: DC
  Filled 2022-04-15: qty 90, 90d supply, fill #0
  Filled 2022-04-15: qty 30, 30d supply, fill #0

## 2022-04-15 MED ORDER — GABAPENTIN 600 MG PO TABS
600.0000 mg | ORAL_TABLET | Freq: Three times a day (TID) | ORAL | 3 refills | Status: AC
  Filled 2022-04-15: qty 90, 30d supply, fill #0
  Filled 2022-05-11: qty 90, 30d supply, fill #1
  Filled 2022-06-12: qty 90, 30d supply, fill #2

## 2022-04-15 MED ORDER — KETOROLAC TROMETHAMINE 30 MG/ML IJ SOLN
30.0000 mg | Freq: Once | INTRAMUSCULAR | Status: AC
  Administered 2022-04-15: 30 mg via INTRAMUSCULAR

## 2022-04-15 MED ORDER — AMLODIPINE BESYLATE 10 MG PO TABS
10.0000 mg | ORAL_TABLET | Freq: Every day | ORAL | 0 refills | Status: AC
  Filled 2022-04-15: qty 90, 90d supply, fill #0
  Filled 2022-04-16: qty 30, 30d supply, fill #0
  Filled 2022-05-11: qty 30, 30d supply, fill #1
  Filled 2022-06-12: qty 30, 30d supply, fill #2

## 2022-04-15 MED ORDER — CLONIDINE HCL 0.1 MG PO TABS
0.1000 mg | ORAL_TABLET | Freq: Once | ORAL | Status: AC
  Administered 2022-04-15: 0.1 mg via ORAL

## 2022-04-15 MED ORDER — METHYLPREDNISOLONE 4 MG PO TBPK
ORAL_TABLET | ORAL | 0 refills | Status: DC
  Filled 2022-04-15: qty 21, 6d supply, fill #0

## 2022-04-15 NOTE — Assessment & Plan Note (Signed)
Lab Results  Component Value Date   NA 141 03/16/2022   K 3.9 03/16/2022   CO2 24 03/16/2022   BUN 24 (H) 03/16/2022   CREATININE 1.15 03/16/2022   CALCIUM 9.5 03/16/2022   GLUCOSE 101 (H) 03/16/2022  Creatinine and GFR normal.  Avoid nephrotoxic agents drink at least 64 ounces of water daily to maintain hydration

## 2022-04-15 NOTE — Patient Instructions (Signed)
1. Lumbar radiculopathy  - gabapentin (NEURONTIN) 600 MG tablet; Take 1 tablet (600 mg total) by mouth 3 (three) times daily.  Dispense: 90 tablet; Refill: 3 - methylPREDNISolone (MEDROL DOSEPAK) 4 MG TBPK tablet; Take as instructed on the packaging  Dispense: 1 each; Refill: 0 - nortriptyline (PAMELOR) 25 MG capsule; Take 1 capsule (25 mg total) by mouth at bedtime.  Dispense: 90 capsule; Refill: 0  CVA - aspirin 81 MG chewable tablet; Chew 1 tablet (81 mg total) by mouth daily.   Primary hypertension  - amLODipine (NORVASC) 10 MG tablet; Take 1 tablet (10 mg total) by mouth daily.  Dispense: 90 tablet; Refill: 0 - cloNIDine (CATAPRES) 0.2 MG tablet; Take 1 tablet (0.2 mg total) by mouth 2 (two) times daily.  Dispense: 60 tablet; Refill: 3 - metoprolol tartrate (LOPRESSOR) 100 MG tablet; Take 1 tablet (100 mg total) by mouth 2 (two) times daily.  Dispense: 60 tablet; Refill: 3  It is important that you exercise regularly at least 30 minutes 5 times a week as tolerated  Think about what you will eat, plan ahead. Choose " clean, green, fresh or frozen" over canned, processed or packaged foods which are more sugary, salty and fatty. 70 to 75% of food eaten should be vegetables and fruit. Three meals at set times with snacks allowed between meals, but they must be fruit or vegetables. Aim to eat over a 12 hour period , example 7 am to 7 pm, and STOP after  your last meal of the day. Drink water,generally about 64 ounces per day, no other drink is as healthy. Fruit juice is best enjoyed in a healthy way, by EATING the fruit.  Thanks for choosing [facility] we consider it a privelige to serve you.

## 2022-04-15 NOTE — Assessment & Plan Note (Signed)
Need to avoid using illicit strength including risk of blood infection, heart disease, death discussed with the patient verbalized understanding.

## 2022-04-15 NOTE — Assessment & Plan Note (Signed)
Smoking cessation education completed.
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
## 2022-04-15 NOTE — Assessment & Plan Note (Signed)
Smokes about 0.5 pack/day  Asked about quitting: confirms that he/she currently smokes cigarettes Advise to quit smoking: Educated about QUITTING to reduce the risk of cancer, cardio and cerebrovascular disease. Assess willingness: Unwilling to quit at this time, but is working on cutting back. Assist with counseling and pharmacotherapy: Counseled for 5 minutes and literature provided. Arrange for follow up: follow up in 1 months and continue to offer help.  

## 2022-04-15 NOTE — Assessment & Plan Note (Signed)
Currently on gabapentin 300 mg twice daily Stated that he ran out of this medication Due to his increasing pain gabapentin 600 mg 3 times daily started Continue Pamelor 25 mg at bedtime Medrol dose 4 mg ordered today, Toradol 30 mg IM injection, Solu-Medrol 40 mg IM injection given in the office today He was encouraged to follow-up with orthopedics

## 2022-04-15 NOTE — Assessment & Plan Note (Signed)
BP Readings from Last 3 Encounters:  04/15/22 (!) 180/120  04/07/22 134/88  03/30/22 (!) 158/102  Chronic uncontrolled condition Patient stated that he has run out of amlodipine 10 mg daily, clonidine 0.2 mg twice daily, metoprolol 100 mg twice daily.  Medications were refilled today Clonidine 0.1 mg one-time dose given unable patient left the office before we could recheck his blood pressure.  Counseled on DASH diet.

## 2022-04-15 NOTE — Assessment & Plan Note (Addendum)
Now resolved liver enzymes back to normal Patient encouraged to avoid alcohol He was encouraged to follow-up with infectious disease for treatment for his hepatitis C

## 2022-04-15 NOTE — Assessment & Plan Note (Signed)
Patient has completed full course of Plavix Was told to continue taking aspirin 81 mg tablet daily Smoking cessation completed Need to get blood pressure under control discussed Patient was encouraged to follow-up with neurology as planned

## 2022-04-16 ENCOUNTER — Other Ambulatory Visit: Payer: Self-pay

## 2022-04-16 ENCOUNTER — Telehealth: Payer: Self-pay | Admitting: Clinical

## 2022-04-16 NOTE — Telephone Encounter (Signed)
[facility]ated [facility]al [facility] Case Management Referral Note  04/16/2022 Name: AVIK LEONI MRN: [id number] DOB: 03-15-1971 DAVAUN QUINTELA is a 51 y.o. year old male who sees Fulp, Cammie, MD for primary care. LCSW was consulted to assess patient's needs and assist the patient with financial constraints related to lack of health coverage.  Interpreter: No.   Interpreter Name & Language: none  Assessment: Patient experiencing financial constraints related to lack of health coverage.  Intervention: CSW called mobile number listed for patient and patient's wife answered. Advised wife that patient should receive a financial assistance application soon. He should complete that application including any necessary documents and submit it for review. Advised wife to contact CSW with questions once they receive the packet.  Review of patient status, including review of consultants reports, relevant laboratory and other test results, and collaboration with appropriate care team members and the patient's provider was performed as part of comprehensive patient evaluation and provision of services.    Abigail Butts, LCSW [facility] [phone]

## 2022-04-29 ENCOUNTER — Ambulatory Visit (INDEPENDENT_AMBULATORY_CARE_PROVIDER_SITE_OTHER): Payer: Self-pay

## 2022-04-29 ENCOUNTER — Other Ambulatory Visit: Payer: Self-pay

## 2022-04-29 ENCOUNTER — Ambulatory Visit (INDEPENDENT_AMBULATORY_CARE_PROVIDER_SITE_OTHER): Payer: 59 | Admitting: Orthopedic Surgery

## 2022-04-29 ENCOUNTER — Encounter: Payer: Self-pay | Admitting: Orthopedic Surgery

## 2022-04-29 VITALS — BP 177/107 | HR 66 | Ht 72.0 in | Wt 135.0 lb

## 2022-04-29 DIAGNOSIS — M545 Low back pain, unspecified: Secondary | ICD-10-CM

## 2022-04-29 DIAGNOSIS — M5416 Radiculopathy, lumbar region: Secondary | ICD-10-CM

## 2022-04-29 MED ORDER — METHOCARBAMOL 750 MG PO TABS
750.0000 mg | ORAL_TABLET | Freq: Four times a day (QID) | ORAL | 0 refills | Status: DC
  Filled 2022-04-29: qty 120, 30d supply, fill #0

## 2022-04-29 NOTE — Progress Notes (Signed)
Orthopedic Spine Surgery Office Note  Assessment: Patient is a 52 y.o. male with chronic low back pain with acute onset of radicular right leg pain. Leg pain could be residual from his rhabdomyolysis     Plan: -Explained that initially conservative treatment is tried as a significant number of patients may experience relief with these treatment modalities. Discussed that the conservative treatments include:  -activity modification  -physical therapy  -over the counter pain medications  -medrol dosepak  -lumbar steroid injections -Patient has tried 6 weeks of NSAIDs, tylenol, 6 weeks of PT and home exercises, gabapentin, narcotics  -Asked if there are any medications that he has not tried that I could prescribe. Prescribed him robaxin since he has not tried that. He was interested in something stronger than a muscle relaxer so I referred him to pain management since I use narcotics to control post-operative pain in my practice -Recommended MRI of the lumbar spine to evaluate for radiculopathy -Explained that if we ever decide to do surgery for his problem, he would need to be nicotine free for 8 weeks in advance of surgery. Would do a urine test too -Patient should return to office in 4 weeks, x-rays at next visit: none   Patient expressed understanding of the plan and all questions were answered to the patient's satisfaction.   ___________________________________________________________________________   History:  Patient is a 53 y.o. male who presents today for lumbar spine. Patient reports years of low back pain. States he had a prior infection in his back which was treated with antibiotics. He had been able to live with his chronic back pain until about 6 weeks ago. He states he had a stroke 6 weeks ago and was in the hospital. Since that time, he has noticed acute worsening of his low back pain with pain radiating down the posterior aspect of the his right leg and into the dorsal aspect  of his right foot. He states he was unconscious due to the stroke but he is only aware of falling from standing height. No other trauma that caused onset of this pain. Noted records from his admission state he had passed out from drug abuse and had rhabdomyolysis of the right lower extremity. He states he was unable to move his right leg but has regained a lot of strength since admission. He has been working with PT and doing home exercises. No symptoms on the left lower extremity.    Weakness: yes, right leg still feels weaker than the left. No other weakness Symptoms of imbalance: denies Paresthesias and numbness: yes, down the posterior aspect of the right leg. No other numbness or paresthesias Bowel or bladder incontinence: denies Saddle anesthesia: denies  Treatments tried: NSAIDs, tylenol, PT, home exercises, gabapentin, narcotics  Review of systems: Denies fevers and chills, night sweats, unexplained weight loss, history of cancer. Has had pain that wakes him at night  Past medical history: Drug abuse HTN Hepatitis  Depression Stroke Chronic pain  Allergies: sulfa, penicllins  Past surgical history:  Exploratory laparotomy  Social history: Reports use of nicotine product (smoking, vaping, patches, smokeless) - 1 ppd Alcohol use: denies Denies recreational drug use   Physical Exam:  General: no acute distress, appears stated age Neurologic: alert, answering questions appropriately, following commands Respiratory: unlabored breathing on room air, symmetric chest rise Psychiatric: appropriate affect, normal cadence to speech   MSK (spine):  -Strength exam      Left  Right EHL    5/5  1/5 TA  5/5  5/5 GSC    5/5  5/5 Knee extension  5/5  4/5 Hip flexion   5/5  4/5  -Sensory exam    Sensation intact to light touch in L3-S1 nerve distributions of bilateral lower extremities  -Achilles DTR: 2/4 on the left, 2/4 on the right -Patellar tendon DTR: 2/4 on the  left, 2/4 on the right  -Straight leg raise: positive -Contralateral straight leg raise: negative -Clonus: no beats bilaterally  -Left hip exam: no pain through range of motion, negative FADIR, negative stinchfield, negative FABER -Right hip exam: positive FADIR, no pain through remainder of range of motion, negative stinchfield, negative FABER  Imaging: XR of the lumbar spine from 04/29/2022 was independently reviewed and interpreted, showing suspected autofusion at L4/5. Disc height loss at L3/4. No fracture or dislocation. No evidence of instability on flexion/extension.    Patient name: Franklin Anderson Patient MRN: [id number] Date of visit: 04/29/22

## 2022-04-30 ENCOUNTER — Other Ambulatory Visit: Payer: Self-pay

## 2022-05-05 ENCOUNTER — Other Ambulatory Visit: Payer: Self-pay

## 2022-05-07 ENCOUNTER — Telehealth: Payer: Self-pay | Admitting: Orthopedic Surgery

## 2022-05-07 ENCOUNTER — Telehealth: Payer: Self-pay | Admitting: Physical Medicine and Rehabilitation

## 2022-05-07 NOTE — Telephone Encounter (Signed)
I called and lmo for patients wife, advised that his last MRI of his back was 04/2019 and since it has been 3 years that we need to get an updated MRI.

## 2022-05-07 NOTE — Telephone Encounter (Signed)
Pt's wife called again stating she remember her husband having a back MRI while in [facility] while he was admitted and asking if Dr. Laurance Flatten can read from the MRI from [facility] instead of getting another one for lower back. Please call pt's wife [name] and pt to discuss results if possible at [phone].

## 2022-05-07 NOTE — Telephone Encounter (Signed)
Pt's wife called again stating Dr Laurance Flatten was to send in referral for back injection for her husband. She is asking for Dr Laurance Flatten to send referral Dr. Ernestina Patches. Also she also states a referral was sent to [facility] for pt to have a MRI. Please call Pt's wife [name] at [phone].

## 2022-05-07 NOTE — Telephone Encounter (Signed)
LVM to return call to clinic concerning referral

## 2022-05-07 NOTE — Telephone Encounter (Signed)
Pt's wife [name] called requesting a call to set an referral appt. Please call pt at [phone].

## 2022-05-07 NOTE — Telephone Encounter (Signed)
I called and spoke to his wife, I gave her the number to call and get him scheduled [phone]. She states that she will call them. I adivsed that as far as the injection we can not order that till we have seen the MRI and know where to put it which [place] why we need the MRI first.  She states that she understands

## 2022-05-11 ENCOUNTER — Other Ambulatory Visit: Payer: Self-pay

## 2022-05-14 ENCOUNTER — Other Ambulatory Visit: Payer: Self-pay

## 2022-05-17 ENCOUNTER — Emergency Department (HOSPITAL_COMMUNITY)
Admission: EM | Admit: 2022-05-17 | Discharge: 2022-05-17 | Disposition: A | Discharge status: Home / Self Care | Payer: 59 | Attending: Emergency Medicine | Admitting: Emergency Medicine

## 2022-05-17 ENCOUNTER — Encounter (HOSPITAL_COMMUNITY): Payer: Self-pay

## 2022-05-17 ENCOUNTER — Other Ambulatory Visit: Payer: Self-pay

## 2022-05-17 ENCOUNTER — Emergency Department (HOSPITAL_COMMUNITY): Payer: 59

## 2022-05-17 DIAGNOSIS — S6992XA Unspecified injury of left wrist, hand and finger(s), initial encounter: Secondary | ICD-10-CM | POA: Diagnosis not present

## 2022-05-17 DIAGNOSIS — Z7982 Long term (current) use of aspirin: Secondary | ICD-10-CM | POA: Diagnosis not present

## 2022-05-17 DIAGNOSIS — Z8673 Personal history of transient ischemic attack (TIA), and cerebral infarction without residual deficits: Secondary | ICD-10-CM | POA: Insufficient documentation

## 2022-05-17 DIAGNOSIS — Z23 Encounter for immunization: Secondary | ICD-10-CM | POA: Diagnosis not present

## 2022-05-17 DIAGNOSIS — S61412A Laceration without foreign body of left hand, initial encounter: Secondary | ICD-10-CM | POA: Insufficient documentation

## 2022-05-17 DIAGNOSIS — W010XXA Fall on same level from slipping, tripping and stumbling without subsequent striking against object, initial encounter: Secondary | ICD-10-CM | POA: Diagnosis not present

## 2022-05-17 DIAGNOSIS — M19032 Primary osteoarthritis, left wrist: Secondary | ICD-10-CM | POA: Diagnosis not present

## 2022-05-17 DIAGNOSIS — I10 Essential (primary) hypertension: Secondary | ICD-10-CM | POA: Insufficient documentation

## 2022-05-17 MED ORDER — LIDOCAINE HCL (PF) 2 % IJ SOLN: 10.0000 mL | Freq: Once | INTRAMUSCULAR | Status: AC

## 2022-05-17 MED ORDER — LIDOCAINE HCL (PF) 2 % IJ SOLN: 10.0000 mL | Freq: Once | INTRAMUSCULAR | Status: DC

## 2022-05-17 MED ORDER — OXYCODONE-ACETAMINOPHEN 5-325 MG PO TABS
1.0000 | ORAL_TABLET | Freq: Once | ORAL | Status: AC
  Administered 2022-05-17: 1 via ORAL
  Filled 2022-05-17: qty 1

## 2022-05-17 MED ORDER — TETANUS-DIPHTH-ACELL PERTUSSIS 5-2.5-18.5 LF-MCG/0.5 IM SUSY
0.5000 mL | PREFILLED_SYRINGE | Freq: Once | INTRAMUSCULAR | Status: AC
  Administered 2022-05-17: 0.5 mL via INTRAMUSCULAR
  Filled 2022-05-17: qty 0.5

## 2022-05-17 MED ORDER — OXYCODONE HCL 5 MG PO TABS
5.0000 mg | ORAL_TABLET | Freq: Once | ORAL | Status: AC
  Administered 2022-05-17: 5 mg via ORAL
  Filled 2022-05-17: qty 1

## 2022-05-17 MED ORDER — CLINDAMYCIN HCL 150 MG PO CAPS
300.0000 mg | ORAL_CAPSULE | Freq: Once | ORAL | Status: AC
  Administered 2022-05-17: 300 mg via ORAL
  Filled 2022-05-17: qty 2

## 2022-05-17 MED ORDER — CLINDAMYCIN HCL 300 MG PO CAPS
300.0000 mg | ORAL_CAPSULE | Freq: Three times a day (TID) | ORAL | 0 refills | Status: AC
  Filled 2022-05-17: qty 21, 7d supply, fill #0

## 2022-05-17 MED ORDER — OXYCODONE-ACETAMINOPHEN 5-325 MG PO TABS
1.0000 | ORAL_TABLET | Freq: Once | ORAL | Status: DC
  Filled 2022-05-17: qty 1

## 2022-05-17 MED ORDER — POVIDONE-IODINE 10 % EX SOLN
CUTANEOUS | Status: AC
  Administered 2022-05-17: 1
  Filled 2022-05-17: qty 14.8

## 2022-05-17 MED ORDER — OXYCODONE HCL 5 MG PO TABS: 5.0000 mg | ORAL_TABLET | Freq: Once | ORAL | Status: DC

## 2022-05-17 MED ORDER — LIDOCAINE HCL (PF) 2 % IJ SOLN
INTRAMUSCULAR | Status: AC
  Administered 2022-05-17: 10 mL
  Filled 2022-05-17: qty 5

## 2022-05-17 NOTE — Discharge Instructions (Addendum)
You were seen in the emergency department for hand laceration.   We have closed your laceration(s) with sutures. These need to be removed in 10 days. This can be done at any doctor's office, urgent care, or emergency department.   If any of the sutures come out before it is time for removal, that is okay. Make sure to keep the area as clean and dry as possible. You can let warm soapy warm run over the area, but do NOT scrub it.   Watch out for signs of infection, like we discussed, including: increased redness, tenderness, or drainage of pus from the area.   You can take over the counter pain medicine like ibuprofen or tylenol as needed.   I've sent a prescription for antibiotics. This antibiotic is safe to use with your allergies, but it can have more side effects, most commonly, stomach upset. Make sure you're staying well hydrated. You can take probiotics from the drug store.

## 2022-05-17 NOTE — ED Triage Notes (Addendum)
Pt reports he tripped and fell into his tool box and his left hand gripped a machete.  Wound is wrapped and bleeding is controled.

## 2022-05-17 NOTE — ED Provider Notes (Signed)
[facility] Provider Note   CSN: [id number] Arrival date & time: 05/17/22  1412     History  Chief Complaint  Patient presents with   Extremity Laceration    Franklin Anderson is a 52 y.o. 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 Patient states he tripped and fell, with his hand landing in his tooth box. Cut himself on a machete in the tool box. He notes the machete is serrated on one end and smooth knife on the other. Reports it was clean. Unknown last tetanus. No other injuries noted.   HPI     Home Medications Prior to Admission medications   Medication Sig Start Date End Date Taking? Authorizing Provider  clindamycin (CLEOCIN) 300 MG capsule Take 1 capsule (300 mg total) by mouth 3 (three) times daily for 7 days. 05/17/22 05/24/22 Yes Adeyemi Hamad T, PA-C  amLODipine (NORVASC) 10 MG tablet Take 1 tablet (10 mg total) by mouth daily. 04/15/22   Renee Rival, FNP  aspirin 81 MG chewable tablet Chew 1 tablet (81 mg total) by mouth daily. 04/15/22 07/14/22  Renee Rival, FNP  cloNIDine (CATAPRES) 0.2 MG tablet Take 1 tablet (0.2 mg total) by mouth 2 (two) times daily. 04/15/22   Renee Rival, FNP  clopidogrel (PLAVIX) 75 MG tablet Take 1 tablet (75 mg total) by mouth daily. 03/12/22   Angiulli, Lavon Paganini, PA-C  diclofenac Sodium (VOLTAREN) 1 % GEL Apply 4 g topically 4 (four) times daily. 03/12/22   Angiulli, Lavon Paganini, PA-C  gabapentin (NEURONTIN) 600 MG tablet Take 1 tablet (600 mg total) by mouth 3 (three) times daily. 04/15/22   Paseda, Dewaine Conger, FNP  leptospermum manuka honey (MEDIHONEY) PSTE paste Apply 1 Application topically daily. 03/12/22   Angiulli, Lavon Paganini, PA-C  lidocaine (LIDODERM) 5 % Place 1 patch onto the skin daily. Remove & Discard patch within 12 hours or as directed by MD 03/12/22   Angiulli,  Lavon Paganini, PA-C  methocarbamol (ROBAXIN-750) 750 MG tablet Take 1 tablet (750 mg total) by mouth 4 (four) times daily. 04/29/22   Callie Fielding, MD  methylPREDNISolone (MEDROL DOSEPAK) 4 MG TBPK tablet Take as instructed on the packaging 04/15/22   Renee Rival, FNP  metoprolol tartrate (LOPRESSOR) 100 MG tablet Take 1 tablet (100 mg total) by mouth 2 (two) times daily. 04/15/22   Renee Rival, FNP  mupirocin ointment (BACTROBAN) 2 % Apply 1 Application topically 2 (two) times daily. Patient not taking: Reported on 04/15/2022 03/30/22   Talbot Grumbling, FNP  nicotine (NICODERM CQ - DOSED IN MG/24 HOURS) 14 mg/24hr patch 14 mg patch daily x3 weeks then 7 mg patch daily x3 weeks and stop Patient not taking: Reported on 04/15/2022 03/12/22   Angiulli, Lavon Paganini, PA-C  nortriptyline (PAMELOR) 25 MG capsule Take 1 capsule (25 mg total) by mouth at bedtime. 04/15/22   Renee Rival, FNP  oxyCODONE-acetaminophen (PERCOCET/ROXICET) 5-325 MG tablet Take 1 tablet by mouth every 6 (six) hours as needed for severe pain. Patient not taking: Reported on 04/15/2022 04/07/22   Mesner, Corene Cornea, MD  oxyCODONE-acetaminophen (PERCOCET/ROXICET) 5-325 MG tablet take 1 tablet by mouth every 6 hours as needed for severe pain Patient not taking: Reported on 04/15/2022 04/07/22   Mesner, Corene Cornea, MD      Allergies    Sulfa antibiotics and Penicillins  Review of Systems   Review of Systems  Skin:  Positive for wound.  All other systems reviewed and are negative.   Physical Exam Updated Vital Signs BP (!) 162/114 (BP Location: Right Arm)   Pulse 83   Temp 98.1 F (36.7 C) (Oral)   Resp 18   Ht 5\' 11"  (1.803 m)   Wt 59 kg   SpO2 98%   BMI 18.13 kg/m  Physical Exam Vitals and nursing note reviewed.  Constitutional:      Appearance: Normal appearance.  HENT:     Head: Normocephalic and atraumatic.  Eyes:     Conjunctiva/sclera: Conjunctivae normal.  Pulmonary:     Effort:  Pulmonary effort is normal. No respiratory distress.  Musculoskeletal:     Comments: Normal range of motion of all digits of the left hand and left wrist  Skin:    General: Skin is warm and dry.     Capillary Refill: Capillary refill takes less than 2 seconds.     Comments: Approximately 4 cm linear laceration noted to the dorsum of the left hand, with partial extension to the lateral aspect of the hand.  Just proximal to the left fourth and fifth MCPs.  Neurological:     Mental Status: He is alert.  Psychiatric:        Mood and Affect: Mood normal.        Behavior: Behavior normal.     ED Results / Procedures / Treatments   Labs (all labs ordered are listed, but only abnormal results are displayed) Labs Reviewed - No data to display  EKG None  Radiology DG Hand Complete Left  Result Date: 05/17/2022 CLINICAL DATA:  Machete injury EXAM: LEFT HAND - COMPLETE 3+ VIEW COMPARISON:  None Available. FINDINGS: There is no evidence of fracture or dislocation. There is radiocarpal joint space narrowing compatible with mild degenerative change. Joint spaces are otherwise well maintained. There is a healed fifth metacarpal neck fracture. Soft tissues are unremarkable. No foreign body identified. IMPRESSION: 1. No acute fracture or dislocation. 2. Mild radiocarpal degenerative change. Electronically Signed   By: 05/19/2022 M.D.   On: 05/17/2022 16:09    Procedures Procedures    Medications Ordered in ED Medications  lidocaine HCl (PF) (XYLOCAINE) 2 % injection 10 mL (10 mLs Infiltration Given 05/17/22 1645)  povidone-iodine (BETADINE) 10 % external solution (1 Application  Given 05/17/22 1616)  Tdap (BOOSTRIX) injection 0.5 mL (0.5 mLs Intramuscular Given 05/17/22 1613)  oxyCODONE (Oxy IR/ROXICODONE) immediate release tablet 5 mg (5 mg Oral Given 05/17/22 1610)  oxyCODONE-acetaminophen (PERCOCET/ROXICET) 5-325 MG per tablet 1 tablet (1 tablet Oral Given 05/17/22 1715)  clindamycin (CLEOCIN)  capsule 300 mg (300 mg Oral Given 05/17/22 1715)    ED Course/ Medical Decision Making/ A&P                             Medical Decision Making Amount and/or Complexity of Data Reviewed Radiology: ordered.  Risk Prescription drug management.   Patient is a 52 y.o. male who presents to the emergency department with concern for hand laceration. Wound occurred <3 hrs prior to ER arrival.   Physical exam: 4 cm linear laceration to dorsum of left hand. Normal ROM of digits.  Imaging: X-ray of the left hand without foreign bodies or bony abnormalities. I reviewed and interpreted the images and agree with the radiologist interpretation.   Procedure: Wound explored and base of wound visualized  in a bloodless field without evidence of foreign body. Laceration was anesthestized with lidocaine and closed with sutures. Patient tolerated procedure well with no immediate complications. Tdap was updates.   Medications: Given pain medications and empiric antibiotics. I have reviewed patient's home medications and made changes as needed.   Disposition: Patient has several comorbidities to effect normal wound healing. Patient discharged with antibiotics.  Severe allergies to PCN and sulfa antibiotics so give clindamycin. Given information about side effects .Discussed suture home care with patient and answered questions. Patient to follow-up for wound check and suture removal in 10 days; they are to return to the ED sooner for signs of infection. Pt is hemodynamically stable with no complaints prior to discharge.  Final Clinical Impression(s) / ED Diagnoses Final diagnoses:  Laceration of left hand without foreign body, initial encounter    Rx / DC Orders ED Discharge Orders          Ordered    clindamycin (CLEOCIN) 300 MG capsule  3 times daily        05/17/22 1712           Portions of this report may have been transcribed using voice recognition software. Every effort was made to  ensure accuracy; however, inadvertent computerized transcription errors may be present.    Estill Cotta 05/17/22 1720    Godfrey Pick, MD 05/20/22 [phone]

## 2022-05-18 ENCOUNTER — Telehealth: Payer: Self-pay | Admitting: Clinical

## 2022-05-18 ENCOUNTER — Other Ambulatory Visit: Payer: Self-pay

## 2022-05-18 NOTE — Telephone Encounter (Signed)
[facility] General Follow Up Note  05/18/2022 Name: Franklin Anderson MRN: [id number] DOB: Jun 16, 1970 Franklin Anderson is a 52 y.o. year old male who sees Fulp, Ander Gaster, MD for primary care. LCSW was initially consulted to assist the patient with financial constraints related to lack of health coverage.   Interpreter: No.   Interpreter Name & Language: none  Assessment: Patient experiencing financial constraints related to lack of health coverage.   Ongoing Intervention: CSW called patient's listed mobile number and wife answered. Reviewed [employer] application required documents with her over the phone. Answered questions. They will still need to gather patient's IRS non-filing letter, and wife's tax information, bank statements, and food stamp award letter. Advised wife of this and provided CSW contact information. CSW available for assistance if needed.  Review of patient status, including review of consultants reports, relevant laboratory and other test results, and collaboration with appropriate care team members and the patient's provider was performed as part of comprehensive patient evaluation and provision of services.    Estanislado Emms, [facility] [phone]

## 2022-05-20 ENCOUNTER — Telehealth: Payer: Self-pay | Admitting: Orthopedic Surgery

## 2022-05-20 ENCOUNTER — Ambulatory Visit (HOSPITAL_COMMUNITY): Payer: 59

## 2022-05-20 NOTE — Telephone Encounter (Signed)
Pt's wife [name] called requesting for Dr Laurance Flatten to go over the MRI results. Pt appt is Feb. 4. Please call pt about this matter at [phone]

## 2022-05-21 ENCOUNTER — Inpatient Hospital Stay: Payer: Self-pay | Admitting: Neurology

## 2022-05-21 ENCOUNTER — Encounter: Payer: Self-pay | Admitting: Neurology

## 2022-05-21 NOTE — Telephone Encounter (Signed)
I called and Franklin Anderson that Dr. Laurance Flatten would be out of the office the following week after his MRI, but I moved the review appt up to 06/10/22 @9am 

## 2022-05-25 ENCOUNTER — Ambulatory Visit: Payer: Self-pay | Admitting: Orthopedic Surgery

## 2022-05-25 ENCOUNTER — Other Ambulatory Visit: Payer: Self-pay

## 2022-05-31 ENCOUNTER — Inpatient Hospital Stay: Admission: RE | Admit: 2022-05-31 | Payer: 59 | Source: Ambulatory Visit

## 2022-06-10 ENCOUNTER — Ambulatory Visit: Payer: 59 | Admitting: Orthopedic Surgery

## 2022-06-12 ENCOUNTER — Other Ambulatory Visit: Payer: Self-pay

## 2022-06-18 ENCOUNTER — Other Ambulatory Visit: Payer: Self-pay | Admitting: Nurse Practitioner

## 2022-06-18 ENCOUNTER — Ambulatory Visit: Payer: Self-pay | Admitting: Orthopedic Surgery

## 2022-06-18 ENCOUNTER — Telehealth: Payer: Self-pay | Admitting: Nurse Practitioner

## 2022-06-18 ENCOUNTER — Other Ambulatory Visit: Payer: Self-pay

## 2022-06-18 NOTE — Telephone Encounter (Signed)
Pt's wife Gaspard Wilder) LVM on Nurse Line 06/18/22 at 11:10 AM requesting inhaler. She is requesting a return call at [phone] ASAP due to husband's need for inhaler.

## 2022-06-19 ENCOUNTER — Other Ambulatory Visit: Payer: Self-pay | Admitting: Nurse Practitioner

## 2022-06-19 DIAGNOSIS — Z720 Tobacco use: Secondary | ICD-10-CM

## 2022-06-19 DIAGNOSIS — J438 Other emphysema: Secondary | ICD-10-CM

## 2022-06-19 MED ORDER — ALBUTEROL SULFATE HFA 108 (90 BASE) MCG/ACT IN AERS: 2.0000 | INHALATION_SPRAY | Freq: Four times a day (QID) | RESPIRATORY_TRACT | 2 refills | Status: AC | PRN

## 2022-06-24 NOTE — Telephone Encounter (Signed)
Pt was made an appointment to go over all medications . Pt was advise to bring all med. [place]

## 2022-06-29 ENCOUNTER — Ambulatory Visit: Payer: 59 | Admitting: Orthopedic Surgery

## 2022-06-29 ENCOUNTER — Ambulatory Visit (HOSPITAL_COMMUNITY)
Admission: EM | Admit: 2022-06-29 | Discharge: 2022-06-29 | Disposition: A | Discharge status: Home / Self Care | Payer: 59 | Attending: Urology | Admitting: Urology

## 2022-06-29 ENCOUNTER — Encounter (HOSPITAL_COMMUNITY): Payer: Self-pay | Admitting: Behavioral Health

## 2022-06-29 DIAGNOSIS — F1910 Other psychoactive substance abuse, uncomplicated: Secondary | ICD-10-CM | POA: Diagnosis not present

## 2022-06-29 DIAGNOSIS — F32A Depression, unspecified: Secondary | ICD-10-CM | POA: Insufficient documentation

## 2022-06-29 DIAGNOSIS — Z1152 Encounter for screening for COVID-19: Secondary | ICD-10-CM | POA: Diagnosis not present

## 2022-06-29 DIAGNOSIS — F1994 Other psychoactive substance use, unspecified with psychoactive substance-induced mood disorder: Secondary | ICD-10-CM | POA: Diagnosis present

## 2022-06-29 DIAGNOSIS — F4310 Post-traumatic stress disorder, unspecified: Secondary | ICD-10-CM | POA: Diagnosis not present

## 2022-06-29 DIAGNOSIS — F1914 Other psychoactive substance abuse with psychoactive substance-induced mood disorder: Secondary | ICD-10-CM | POA: Insufficient documentation

## 2022-06-29 DIAGNOSIS — F1990 Other psychoactive substance use, unspecified, uncomplicated: Secondary | ICD-10-CM | POA: Diagnosis not present

## 2022-06-29 LAB — COMPREHENSIVE METABOLIC PANEL
ALT: 20 U/L (ref 0–44)
AST: 21 U/L (ref 15–41)
Albumin: 4.1 g/dL (ref 3.5–5.0)
Alkaline Phosphatase: 77 U/L (ref 38–126)
Anion gap: 7 (ref 5–15)
BUN: 19 mg/dL (ref 6–20)
CO2: 31 mmol/L (ref 22–32)
Calcium: 9.4 mg/dL (ref 8.9–10.3)
Chloride: 102 mmol/L (ref 98–111)
Creatinine, Ser: 0.87 mg/dL (ref 0.61–1.24)
GFR, Estimated: >60 mL/min (ref >60)
Glucose, Bld: 96 mg/dL (ref 70–99)
Potassium: 5.1 mmol/L (ref 3.5–5.1)
Sodium: 140 mmol/L (ref 135–145)
Total Bilirubin: 0.5 mg/dL (ref 0.3–1.2)
Total Protein: 7.1 g/dL (ref 6.5–8.1)

## 2022-06-29 LAB — CBC WITH DIFFERENTIAL/PLATELET
Abs Immature Granulocytes: 0.03 10*3/uL (ref 0.00–0.07)
Basophils Absolute: 0 10*3/uL (ref 0.0–0.1)
Basophils Relative: 0 %
Eosinophils Absolute: 0.1 10*3/uL (ref 0.0–0.5)
Eosinophils Relative: 1 %
HCT: 46.2 % (ref 39.0–52.0)
Hemoglobin: 15.6 g/dL (ref 13.0–17.0)
Immature Granulocytes: 0 %
Lymphocytes Relative: 27 %
Lymphs Abs: 2.5 10*3/uL (ref 0.7–4.0)
MCH: 32 pg (ref 26.0–34.0)
MCHC: 33.8 g/dL (ref 30.0–36.0)
MCV: 94.9 fL (ref 80.0–100.0)
Monocytes Absolute: 0.6 10*3/uL (ref 0.1–1.0)
Monocytes Relative: 7 %
Neutro Abs: 5.8 10*3/uL (ref 1.7–7.7)
Neutrophils Relative %: 65 %
Platelets: 332 10*3/uL (ref 150–400)
RBC: 4.87 MIL/uL (ref 4.22–5.81)
RDW: 12.4 % (ref 11.5–15.5)
WBC: 9.1 10*3/uL (ref 4.0–10.5)
nRBC: 0 % (ref 0.0–0.2)

## 2022-06-29 LAB — POCT URINE DRUG SCREEN - MANUAL ENTRY (I-SCREEN)
POC Amphetamine UR: NOT DETECTED
POC Buprenorphine (BUP): NOT DETECTED
POC Cocaine UR: POSITIVE — AB
POC Marijuana UR: NOT DETECTED
POC Methadone UR: NOT DETECTED
POC Methamphetamine UR: NOT DETECTED
POC Morphine: NOT DETECTED
POC Oxazepam (BZO): POSITIVE — AB
POC Oxycodone UR: NOT DETECTED
POC Secobarbital (BAR): NOT DETECTED

## 2022-06-29 LAB — RESP PANEL BY RT-PCR (RSV, FLU A&B, COVID)  RVPGX2
Influenza A by PCR: NEGATIVE
Influenza B by PCR: NEGATIVE
Resp Syncytial Virus by PCR: NEGATIVE
SARS Coronavirus 2 by RT PCR: NEGATIVE

## 2022-06-29 LAB — POC SARS CORONAVIRUS 2 AG: SARSCOV2ONAVIRUS 2 AG: NEGATIVE

## 2022-06-29 LAB — TSH: TSH: 1.032 u[IU]/mL (ref 0.350–4.500)

## 2022-06-29 LAB — ETHANOL: Alcohol, Ethyl (B): <10 mg/dL (ref <10)

## 2022-06-29 MED ORDER — HYDROXYZINE HCL 25 MG PO TABS: 25.0000 mg | ORAL_TABLET | Freq: Three times a day (TID) | ORAL | Status: DC | PRN

## 2022-06-29 MED ORDER — ALUM & MAG HYDROXIDE-SIMETH 200-200-20 MG/5ML PO SUSP: 30.0000 mL | ORAL | Status: DC | PRN

## 2022-06-29 MED ORDER — ACETAMINOPHEN 325 MG PO TABS: 650.0000 mg | ORAL_TABLET | Freq: Four times a day (QID) | ORAL | Status: DC | PRN

## 2022-06-29 MED ORDER — TRAZODONE HCL 50 MG PO TABS: 50.0000 mg | ORAL_TABLET | Freq: Every evening | ORAL | Status: DC | PRN

## 2022-06-29 MED ORDER — MAGNESIUM HYDROXIDE 400 MG/5ML PO SUSP: 30.0000 mL | Freq: Every day | ORAL | Status: DC | PRN

## 2022-06-29 NOTE — Discharge Instructions (Addendum)
Substance Abuse Treatment Programs ° °Intensive Outpatient Programs °[facility]     °[address]      °[place], [place]                   °[phone]      ° °[facility] °[address] °[place], [place] °[phone] ° °[facility]     °(Inpatient and outpatient)     °[address]           °[phone]   ° °[facility] °[phone] (Suboxone and Methadone) ° °[address]      °[place], [place] [address]      °[phone]      ° °[address] °[place], [place] °852-3033 ° °[facility] (Outpatient/Inpatient, [facility])    °(insurance only) [phone]      °       °[facility] (Groups & Residential) °[place], [place] °[phone] ° °   °[facility]     °[address]     °[place], [place]      °[phone]      ° °[facility] (for caregivers and family) °[address] °[place], [place] °[phone] ° ° ° ° ° °[facility] °[facility]      °[address], [place], [place] [address]  °[phone]      ° °[facility] °[address] [place], [place] [address] °[phone] ° °[facility]        °[phone]      ° °[facility] °[phone] ° °[facility]             °[address]                                         °[place], [place]                                                °[phone] or [phone]                              ° °[facility] [place] °[address] °[place], [address] °[phone] ° °[facility]    °[address]      °[place], [place]     °[phone]      ° °[facility] °[address] °, [place] °[phone] ° °[facility]   °[address]     °[place], [place] [address]     °[phone]      °Admissions: 8am-3pm M-F ° °[facility] °[address] °[place],  [place] °[phone] ° °[facility]: Residential Program (90 Days)   °[place]      °336-725-8389 or [phone]    ° °[facility] °[place], [place] °(Walk in Hours over the weekend or by referral) ° °[facility] °[address], [place],  [address] °([phone] ° °Crisis Mobile: [facility]:  1-877-626-1772 (for crisis response 24 hours a day) °[facility] Hotline:      1-800-256-2452 °Outpatient Psychiatry and [facility] ° °[facility]: Mobile Crisis   Management 24 hours:  1-877-626-1772 ° °[facility] sliding scale fee and walk in schedule: M-F 8am-12pm/1pm-3pm °[address]  °[place], [place] [address] °[phone] ° °[facility] °[address] °[place], [place] [address] °[phone] ° °[facility] ([facility])- new patient walk-in appointments available Monday - Friday 8am -3pm.          °[address] °[place], [place] [address] °336-676-6840 or crisis line- 336-676-6905 ° °[facility]ensive [facility]patient [facility] °[address] °[place], [place] [address] °336-832-9804 ° °[facility]                  °[facility]      °[phone]      °[address]     °[place], [place] [address]                ° °[facility]   °[facility] °[phone] °[address] °[place], [place] [address] ° ° °[facility]          °[address]  °[place], [place] [address]       °[phone] ° °[facility] °[address] °[place], [place] [address] °336-292-1510 ° °[facility]    °[address]    °[place], [place] [address]     °[phone]      ° °[facility], [facility]     °[address]     °[place] [address]     °336-282-1251     °  °[facility] °[address] °[place] [address] ° °[facility]     °[address]     °[place], [place]      °336-542-2076      ° °[facility] °Franklin Ahlu[place]ia, [facility] Anderson °[address] °[place], [place] [address] °[phone] ° °[facility]     °[address]     °[place], [place] [address]     °336-274-1237      ° °[facility] °[address] °[place], [place] [address] °336-854-4450 °Resources for Temporary Residential Assistance/Crisis Centers ° °DAY CENTERS °[facility] °M-F 8am-3pm   °[address] [place], [place] [address]   336-332-0824 °[facility] include: laundry, barbering, support groups, case management, phone  & computer access, showers, AA/NA mtgs, mental health/substance abuse nurse, job skills class, disability information, VA assistance, spiritual classes, etc.  ° °HOMELESS SHELTERS ° °[facility]     °[facility]   °[address], [place]     °[phone]       °       °[facility] (women and children)       °[address] °, [place] [address] °336-275-0820 °[email or web address] for application and process °Application Required ° °[facility]   °[address]    °[place] [address]     °[phone]       °             °[facility] °[address] °,  [address] °336.273.5572 °336-235-0363(schedule application appt.) °Application Required ° °[facility] (women only)    °[address]     °[place],  [address]     °336-884-1039      °  Intake starts 6pm daily °Need valid ID, SSC, & Police report °[facility] [place] °[address] °[place], [place] °336-881-5420 °Application Required ° °[facility] (men only)     °[address]      °[place], [place]     °[phone]      ° °[facility] °(Pregnant women only) °[address] °[place], [place] °336-275-0206 ° °[facility]      °[address]      °[place], [place] [address]     °[phone]      °       °[facility] °[address] °[place], [place] °336-723-1848 °90 day commitment/SA/Application process ° °[facility](men only)     °[address]     °[place], [place]     °[phone]       °Check-in at 7pm     °       °[facility] °[address] °[place], [place] [address] °336-248-6684 °Men/Women/Women and Children must be there by 7 pm ° °[facility] °[place],  °336-722-8721                ° °

## 2022-06-29 NOTE — ED Notes (Signed)
Patient reported to staff that he has hypertension and that he would like his metoprolol that he takes at home. I advised there are no orders for it however, I will make the provider aware. Provider was notified.

## 2022-06-29 NOTE — ED Provider Notes (Signed)
Rescind IVC After thorough evaluation and review of information currently presented on assessment of Franklin Anderson (respondent), there is insufficient findings to indicate respondent meets criteria for involuntary commitment or require an inpatient level of care.  Respondent is alert/oriented x 4, calm, cooperative, and mood congruent with affect.  Respondent is speaking in a clear tone at moderate volume, and normal pace, with good eye contact.  Respondents' thought process is coherent, relevant, and there is no indication that the respondent is currently responding to internal/external stimuli or experiencing delusional thought content.  Respondent denies suicidal/self-harm/homicidal ideation, psychosis, and paranoia.  Respondent has remained calm and cooperative throughout assessment and responded to questions appropriately.  Currently respondent is not significantly impaired, psychotic, or manic on exam.  A detailed risk assessment has been completed based on clinical exam and individual risk factors.  There is no evidence of imminent risk to self or others at present and respondent does not meet criteria for psychiatric inpatient admission.  Franklin Rabalais B. Jhonny Calixto, NP

## 2022-06-29 NOTE — ED Provider Notes (Signed)
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 Final   Calcium 01/01/2022 9.2  8.7 - 10.2 mg/dL Final   Total Protein 01/01/2022 6.7  6.0 - 8.5 g/dL Final   Albumin 01/01/2022 4.4  3.8 - 4.9 g/dL Final   Globulin, Total 01/01/2022 2.3  1.5 - 4.5 g/dL Final   Albumin/Globulin Ratio 01/01/2022 1.9  1.2 - 2.2 Final   Bilirubin Total 01/01/2022 <0.2  0.0 - 1.2 mg/dL Final   Alkaline Phosphatase 01/01/2022 78  44 - 121 IU/L Final   AST 01/01/2022 19  0 - 40 IU/L Final   WBC 01/01/2022 6.2  3.4 - 10.8 x10E3/uL Final   RBC 01/01/2022 4.43  4.14 - 5.80 x10E6/uL Final   Hemoglobin 01/01/2022 14.3  13.0 - 17.7 g/dL Final   Hematocrit  01/01/2022 41.4  37.5 - 51.0 % Final   MCV 01/01/2022 94  79 - 97 fL Final   MCH 01/01/2022 32.3  26.6 - 33.0 pg Final   MCHC 01/01/2022 34.5  31.5 - 35.7 g/dL Final   RDW 01/01/2022 12.1  11.6 - 15.4 % Final   Platelets 01/01/2022 237  150 - 450 x10E3/uL Final   Neutrophils 01/01/2022 55  Not Estab. % Final   Lymphs 01/01/2022 35  Not Estab. % Final   Monocytes 01/01/2022 7  Not Estab. % Final   Eos 01/01/2022 3  Not Estab. % Final   Basos 01/01/2022 0  Not Estab. % Final   Neutrophils Absolute 01/01/2022 3.3  1.4 - 7.0 x10E3/uL Final   Lymphocytes Absolute 01/01/2022 2.2  0.7 - 3.1 x10E3/uL Final   Monocytes Absolute 01/01/2022 0.4  0.1 - 0.9 x10E3/uL Final   EOS (ABSOLUTE) 01/01/2022 0.2  0.0 - 0.4 x10E3/uL Final   Basophils Absolute 01/01/2022 0.0  0.0 - 0.2 x10E3/uL Final   Immature Granulocytes 01/01/2022 0  Not Estab. % Final   Immature Grans (Abs) 01/01/2022 0.0  0.0 - 0.1 x10E3/uL Final    Allergies: Sulfa antibiotics and Penicillins  Medications:  PTA Medications  Medication Sig   clopidogrel (PLAVIX) 75 MG tablet Take 1 tablet (75 mg total) by mouth daily.   leptospermum manuka honey (MEDIHONEY) PSTE paste Apply 1 Application topically daily.   diclofenac Sodium (VOLTAREN) 1 % GEL Apply 4 g topically 4 (four) times daily.   lidocaine (LIDODERM) 5 % Place 1 patch onto the skin daily. Remove & Discard patch within 12 hours or as directed by MD   nicotine (NICODERM CQ - DOSED IN MG/24 HOURS) 14 mg/24hr patch 14 mg patch daily x3 weeks then 7 mg patch daily x3 weeks and stop (Patient not taking: Reported on 04/15/2022)   mupirocin ointment (BACTROBAN) 2 % Apply  1 Application topically 2 (two) times daily. (Patient not taking: Reported on 04/15/2022)   oxyCODONE-acetaminophen (PERCOCET/ROXICET) 5-325 MG tablet Take 1 tablet by mouth every 6 (six) hours as needed for severe pain. (Patient not taking: Reported on 04/15/2022)   oxyCODONE-acetaminophen (PERCOCET/ROXICET) 5-325 MG  tablet take 1 tablet by mouth every 6 hours as needed for severe pain (Patient not taking: Reported on 04/15/2022)   amLODipine (NORVASC) 10 MG tablet Take 1 tablet (10 mg total) by mouth daily.   cloNIDine (CATAPRES) 0.2 MG tablet Take 1 tablet (0.2 mg total) by mouth 2 (two) times daily.   gabapentin (NEURONTIN) 600 MG tablet Take 1 tablet (600 mg total) by mouth 3 (three) times daily.   metoprolol tartrate (LOPRESSOR) 100 MG tablet Take 1 tablet (100 mg total) by mouth 2 (two) times daily.   methylPREDNISolone (MEDROL DOSEPAK) 4 MG TBPK tablet Take as instructed on the packaging   nortriptyline (PAMELOR) 25 MG capsule Take 1 capsule (25 mg total) by mouth at bedtime.   aspirin 81 MG chewable tablet Chew 1 tablet (81 mg total) by mouth daily.   methocarbamol (ROBAXIN-750) 750 MG tablet Take 1 tablet (750 mg total) by mouth 4 (four) times daily.   albuterol (VENTOLIN HFA) 108 (90 Base) MCG/ACT inhaler Inhale 2 puffs into the lungs every 6 (six) hours as needed for wheezing or shortness of breath.    Medical Decision Making  Patient will be admitted to [facility] for continuous assessment pending collateral information.  Unable to complete IVC first exam at this time due unsuccessful attempts to reach wife (IVC petitioner) for collateral information.     Recommendations  Based on my evaluation the patient does not appear to have an emergency medical condition.  Ophelia Shoulder, NP 06/29/22  6:10 AM

## 2022-06-29 NOTE — ED Notes (Signed)
Pt is currently sleeping, no distress noted, environmental check complete, will continue to monitor patient for safety.  

## 2022-06-29 NOTE — ED Notes (Signed)
Patient was provided with a sandwich, chips and drink

## 2022-06-29 NOTE — ED Notes (Signed)
Patient is sitting in bed, no distress noted, awaiting discharge, will continue to monitor patient for safety
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
## 2022-06-30 ENCOUNTER — Ambulatory Visit: Payer: Self-pay | Admitting: Nurse Practitioner

## 2022-06-30 LAB — HEMOGLOBIN A1C
Hgb A1c MFr Bld: 6 % — ABNORMAL HIGH (ref 4.8–5.6)
Mean Plasma Glucose: 126 mg/dL

## 2022-07-27 DEATH — deceased

## 2023-01-29 ENCOUNTER — Other Ambulatory Visit: Payer: Self-pay | Admitting: Nurse Practitioner

## 2023-01-29 DIAGNOSIS — Z1211 Encounter for screening for malignant neoplasm of colon: Secondary | ICD-10-CM

## 2023-01-29 DIAGNOSIS — Z1212 Encounter for screening for malignant neoplasm of rectum: Secondary | ICD-10-CM
